# Patient Record
Sex: Female | Born: 1935 | Race: White | Hispanic: No | Smoking: Never smoker
Health system: Southern US, Community
[De-identification: ages and names within clinical notes are randomized; demographics above are authoritative.]

## PROBLEM LIST (undated history)

## (undated) DIAGNOSIS — E039 Hypothyroidism, unspecified: Secondary | ICD-10-CM

## (undated) DIAGNOSIS — E119 Type 2 diabetes mellitus without complications: Secondary | ICD-10-CM

## (undated) DIAGNOSIS — C55 Malignant neoplasm of uterus, part unspecified: Secondary | ICD-10-CM

## (undated) DIAGNOSIS — M109 Gout, unspecified: Secondary | ICD-10-CM

## (undated) DIAGNOSIS — G4733 Obstructive sleep apnea (adult) (pediatric): Secondary | ICD-10-CM

## (undated) DIAGNOSIS — I509 Heart failure, unspecified: Secondary | ICD-10-CM

## (undated) DIAGNOSIS — I82409 Acute embolism and thrombosis of unspecified deep veins of unspecified lower extremity: Secondary | ICD-10-CM

## (undated) DIAGNOSIS — J189 Pneumonia, unspecified organism: Secondary | ICD-10-CM

## (undated) DIAGNOSIS — K219 Gastro-esophageal reflux disease without esophagitis: Secondary | ICD-10-CM

## (undated) DIAGNOSIS — I10 Essential (primary) hypertension: Secondary | ICD-10-CM

## (undated) DIAGNOSIS — K5720 Diverticulitis of large intestine with perforation and abscess without bleeding: Secondary | ICD-10-CM

## (undated) DIAGNOSIS — M1990 Unspecified osteoarthritis, unspecified site: Secondary | ICD-10-CM

## (undated) DIAGNOSIS — M545 Low back pain, unspecified: Secondary | ICD-10-CM

## (undated) DIAGNOSIS — R0600 Dyspnea, unspecified: Secondary | ICD-10-CM

## (undated) DIAGNOSIS — F329 Major depressive disorder, single episode, unspecified: Secondary | ICD-10-CM

## (undated) DIAGNOSIS — G8929 Other chronic pain: Secondary | ICD-10-CM

## (undated) DIAGNOSIS — F32A Depression, unspecified: Secondary | ICD-10-CM

## (undated) DIAGNOSIS — E785 Hyperlipidemia, unspecified: Secondary | ICD-10-CM

## (undated) DIAGNOSIS — K449 Diaphragmatic hernia without obstruction or gangrene: Secondary | ICD-10-CM

## (undated) DIAGNOSIS — R350 Frequency of micturition: Secondary | ICD-10-CM

## (undated) DIAGNOSIS — R351 Nocturia: Secondary | ICD-10-CM

## (undated) DIAGNOSIS — I809 Phlebitis and thrombophlebitis of unspecified site: Secondary | ICD-10-CM

## (undated) DIAGNOSIS — Z6841 Body Mass Index (BMI) 40.0 and over, adult: Secondary | ICD-10-CM

## (undated) DIAGNOSIS — M479 Spondylosis, unspecified: Secondary | ICD-10-CM

## (undated) DIAGNOSIS — Z9989 Dependence on other enabling machines and devices: Principal | ICD-10-CM

## (undated) HISTORY — DX: Gastro-esophageal reflux disease without esophagitis: K21.9

## (undated) HISTORY — DX: Essential (primary) hypertension: I10

## (undated) HISTORY — DX: Morbid (severe) obesity due to excess calories: E66.01

## (undated) HISTORY — DX: Dependence on other enabling machines and devices: Z99.89

## (undated) HISTORY — PX: HERNIA REPAIR: SHX51

## (undated) HISTORY — DX: Hyperlipidemia, unspecified: E78.5

## (undated) HISTORY — DX: Depression, unspecified: F32.A

## (undated) HISTORY — DX: Major depressive disorder, single episode, unspecified: F32.9

## (undated) HISTORY — DX: Spondylosis, unspecified: M47.9

## (undated) HISTORY — DX: Body Mass Index (BMI) 40.0 and over, adult: Z684

## (undated) HISTORY — DX: Phlebitis and thrombophlebitis of unspecified site: I80.9

## (undated) HISTORY — PX: APPENDECTOMY: SHX54

## (undated) HISTORY — PX: COLONOSCOPY: SHX174

## (undated) HISTORY — DX: Diaphragmatic hernia without obstruction or gangrene: K44.9

## (undated) HISTORY — DX: Obstructive sleep apnea (adult) (pediatric): G47.33

---

## 1961-09-28 HISTORY — PX: WISDOM TOOTH EXTRACTION: SHX21

## 1966-09-28 HISTORY — PX: VARICOSE VEIN SURGERY: SHX832

## 1980-09-28 HISTORY — PX: TUBAL LIGATION: SHX77

## 1998-05-02 ENCOUNTER — Ambulatory Visit (HOSPITAL_COMMUNITY): Admission: RE | Admit: 1998-05-02 | Discharge: 1998-05-02 | Payer: Self-pay | Admitting: Family Medicine

## 1999-06-23 ENCOUNTER — Other Ambulatory Visit: Admission: RE | Admit: 1999-06-23 | Discharge: 1999-06-23 | Payer: Self-pay | Admitting: Obstetrics and Gynecology

## 2000-06-22 ENCOUNTER — Other Ambulatory Visit: Admission: RE | Admit: 2000-06-22 | Discharge: 2000-06-22 | Payer: Self-pay | Admitting: *Deleted

## 2001-06-24 ENCOUNTER — Other Ambulatory Visit: Admission: RE | Admit: 2001-06-24 | Discharge: 2001-06-24 | Payer: Self-pay | Admitting: Obstetrics and Gynecology

## 2002-06-22 ENCOUNTER — Other Ambulatory Visit: Admission: RE | Admit: 2002-06-22 | Discharge: 2002-06-22 | Payer: Self-pay | Admitting: Obstetrics and Gynecology

## 2002-09-28 HISTORY — PX: LAPAROSCOPIC CHOLECYSTECTOMY: SUR755

## 2002-09-28 HISTORY — PX: LAPAROSCOPIC INCISIONAL / UMBILICAL / VENTRAL HERNIA REPAIR: SUR789

## 2003-11-12 ENCOUNTER — Ambulatory Visit (HOSPITAL_COMMUNITY): Admission: RE | Admit: 2003-11-12 | Discharge: 2003-11-12 | Payer: Self-pay | Admitting: Family Medicine

## 2004-03-21 ENCOUNTER — Ambulatory Visit (HOSPITAL_COMMUNITY): Admission: RE | Admit: 2004-03-21 | Discharge: 2004-03-21 | Payer: Self-pay | Admitting: Family Medicine

## 2005-09-07 ENCOUNTER — Encounter: Admission: RE | Admit: 2005-09-07 | Discharge: 2005-09-07 | Payer: Self-pay | Admitting: Surgery

## 2005-10-02 ENCOUNTER — Ambulatory Visit (HOSPITAL_COMMUNITY): Admission: RE | Admit: 2005-10-02 | Discharge: 2005-10-02 | Payer: Self-pay | Admitting: Surgery

## 2005-10-02 ENCOUNTER — Encounter (INDEPENDENT_AMBULATORY_CARE_PROVIDER_SITE_OTHER): Payer: Self-pay | Admitting: *Deleted

## 2008-12-14 ENCOUNTER — Encounter: Admission: RE | Admit: 2008-12-14 | Discharge: 2008-12-14 | Payer: Self-pay | Admitting: Family Medicine

## 2009-01-05 ENCOUNTER — Encounter: Admission: RE | Admit: 2009-01-05 | Discharge: 2009-01-05 | Payer: Self-pay | Admitting: Family Medicine

## 2009-02-22 ENCOUNTER — Encounter: Admission: RE | Admit: 2009-02-22 | Discharge: 2009-02-22 | Payer: Self-pay | Admitting: Neurological Surgery

## 2009-03-29 ENCOUNTER — Encounter: Admission: RE | Admit: 2009-03-29 | Discharge: 2009-03-29 | Payer: Self-pay | Admitting: Neurological Surgery

## 2009-10-04 ENCOUNTER — Encounter: Admission: RE | Admit: 2009-10-04 | Discharge: 2009-10-04 | Payer: Self-pay | Admitting: Neurological Surgery

## 2010-07-18 ENCOUNTER — Encounter: Admission: RE | Admit: 2010-07-18 | Discharge: 2010-07-18 | Payer: Self-pay | Admitting: Family Medicine

## 2011-02-13 NOTE — Op Note (Signed)
NAMEJOESPHINE, SCHEMM               ACCOUNT NO.:  [id number]   MEDICAL RECORD NO.:  [id number]          PATIENT TYPE:  AMB   LOCATION:  DAY                          FACILITY:  [facility]   PHYSICIAN:  Sandria Bales. Ezzard Standing, M.D.  DATE OF BIRTH:  January 17, 1935   DATE OF PROCEDURE:  10/02/2005  DATE OF DISCHARGE:                                 OPERATIVE REPORT   PREOPERATIVE DIAGNOSIS:  Incarcerated umbilical hernia, symptomatic  gallstones.   POSTOPERATIVE DIAGNOSIS:  Incarcerated umbilical hernia with omentum over  the transverse colon, chronic cholecystitis with cholelithiasis.   PROCEDURE:  Laparoscopic cholecystectomy and open repair of incarcerated  umbilical hernia (I spent approximately 45 minutes doing hernia).   SURGEON:  Sandria Bales. Ezzard Standing, M.D.   FIRST ASSISTANT:  Dr. Daphine Deutscher.   ANESTHESIA:  General endotracheal.   ESTIMATED BLOOD LOSS:  Minimal.   INDICATIONS FOR PROCEDURE:  Ms. Hevia is a 76 year old white female who  has vague abdominal pain, but a CT scan done on November 20th showed three  things, first a large hiatal hernia, second, it showed a prominent umbilical  hernia containing incarcerated loop of transverse colon, third,  cholelithiasis.  The patient had a hepatobiliary scan which showed a poor  ejection fraction consistent with chronic cholecystitis.   Patient now presents for two things, first a laparoscopic cholecystectomy,  second, a repair of her umbilical hernia.  The indications and potential  complications of both procedures were explained to the patient.  Potential  complications include but are not limited to bleeding, infection, bile duct  injury, the possibility of hernia infection and recurrence.   OPERATIVE NOTE:  Patient placed in the supine position.  Given a general  endotracheal anesthetic.  She was given 1 gm of Ancef at the initiation of  the procedure.  Her abdomen was prepped with Betadine solution and sterilely  draped.   A smiling  infraumbilical incision was made.  Sharp dissection was carried  down to the rectus abdominis fascia.  I then entered the abdominal cavity  and placed a 0 degree 10 mm laparoscope through a 12 mm Hasson trocar and  secured the Hasson trocar  with a 0 Vicryl suture.  I placed a 10 mm [employer] trocar in the subxiphoid location and a 5 mm right mid subcostal and  5 mm lateral subcostal.  On abdominal exploration, the right and left lobe  of the liver both had evidence of fatty infiltration of the liver,  consistent with her weight.  She is noted to be morbidly obese with  approximately a BMI of 45.   The omentum had been trapped in the hernia.  I spent the first 5 or 10  minutes of the case reducing this omentum from her umbilical hernia.   Once this was reduced, I then turned my attention to the gallbladder.  The  gallbladder was noted to be kind of tucked under the right lobe of the liver  with the medial lobe and the left lobe kind of flopping on top.  This  required a little bit of retraction to get this out  of the way.   I then identified the cystic duct and cystic artery.  I doubly clipped the  cystic artery and got down to the cystic duct.  I put a single clip on the  gallbladder side of the cystic duct.  I then did an intraoperative  cholangiogram.   The intraoperative cholangiogram was done using a cut taut catheter.  Placed  through a 14 gauge Jelco into the side of the side of the cystic duct and  secured with an Endoclip.  I then shot an intraoperative cholangiogram under  fluoroscopy using about 8 cc of half-strength Hypaque solution.  This showed  free flow of contrast down the cystic duct into the common bile duct, up to  the hepatic radicals, down into the duodenal.   This was interpreted as a normal cholangiogram.  The taut catheter was then  removed.  The cystic duct was triply endo-clipped and divided.  The  gallbladder was then sharply and bluntly dissected  from the gallbladder bed.  Prior to complete revision of the gallbladder bed, I revisualized the  triangle of Calot.  I revisualized the gallbladder bed.  There was no  bleeding or bile leak from either area.  I then divided the gallbladder,  placed it in an EndoCatch bag and delivered it through the umbilicus.   I then turned my attention to the umbilical hernia.  I spent approximately  45 minutes, partly because of the patient's obesity, getting down to the  hernia defect.  Her hernia defect was probably about 3 cm in diameter.  I  resected the sac and inverted the sac.  I then closed the hernia  transversely with #1 Novofil sutures.  I did not feel comfortable putting  mesh in because I had done the gallbladder operation with spillage of bile.   I then closed access to her infraumbilical port site with two interrupted 0  Vicryl.  I then retacked the umbilical skin back down to the fascia using a  3-0 Vicryl suture and then closed the skin with a 5-0 Monocryl suture.   The patient tolerated the procedure well.  Sponge and needle counts were  correct at the end of the case.  We put tincture benzoin and Steri-Strips on  each wound after closing with 5-0 Monocryl suture.  Her sponge and needle  count were correct at the end of the case.  She plans to try to go home  today and will see me back in two weeks.      Sandria Bales. Ezzard Standing, M.D.  Electronically Signed     DHN/MEDQ  D:  10/02/2005  T:  10/02/2005  Job:  [id number]   cc:   Molly Maduro A. Nicholos Johns, M.D.  Fax: [phone]

## 2011-10-08 DIAGNOSIS — N952 Postmenopausal atrophic vaginitis: Secondary | ICD-10-CM | POA: Diagnosis not present

## 2011-10-27 DIAGNOSIS — R5381 Other malaise: Secondary | ICD-10-CM | POA: Diagnosis not present

## 2011-10-27 DIAGNOSIS — Z0000 Encounter for general adult medical examination without abnormal findings: Secondary | ICD-10-CM | POA: Diagnosis not present

## 2011-10-27 DIAGNOSIS — R7309 Other abnormal glucose: Secondary | ICD-10-CM | POA: Diagnosis not present

## 2011-11-05 DIAGNOSIS — Z23 Encounter for immunization: Secondary | ICD-10-CM | POA: Diagnosis not present

## 2011-11-12 DIAGNOSIS — G4733 Obstructive sleep apnea (adult) (pediatric): Secondary | ICD-10-CM | POA: Diagnosis not present

## 2012-01-06 DIAGNOSIS — N952 Postmenopausal atrophic vaginitis: Secondary | ICD-10-CM | POA: Diagnosis not present

## 2012-01-20 DIAGNOSIS — L820 Inflamed seborrheic keratosis: Secondary | ICD-10-CM | POA: Diagnosis not present

## 2012-01-20 DIAGNOSIS — L259 Unspecified contact dermatitis, unspecified cause: Secondary | ICD-10-CM | POA: Diagnosis not present

## 2012-01-20 DIAGNOSIS — L723 Sebaceous cyst: Secondary | ICD-10-CM | POA: Diagnosis not present

## 2012-01-28 DIAGNOSIS — G4733 Obstructive sleep apnea (adult) (pediatric): Secondary | ICD-10-CM | POA: Diagnosis not present

## 2012-01-28 DIAGNOSIS — E669 Obesity, unspecified: Secondary | ICD-10-CM | POA: Diagnosis not present

## 2012-01-28 DIAGNOSIS — I10 Essential (primary) hypertension: Secondary | ICD-10-CM | POA: Diagnosis not present

## 2012-04-13 DIAGNOSIS — N952 Postmenopausal atrophic vaginitis: Secondary | ICD-10-CM | POA: Diagnosis not present

## 2012-05-17 DIAGNOSIS — M109 Gout, unspecified: Secondary | ICD-10-CM | POA: Diagnosis not present

## 2012-05-17 DIAGNOSIS — N3941 Urge incontinence: Secondary | ICD-10-CM | POA: Diagnosis not present

## 2012-05-17 DIAGNOSIS — F329 Major depressive disorder, single episode, unspecified: Secondary | ICD-10-CM | POA: Diagnosis not present

## 2012-05-17 DIAGNOSIS — E119 Type 2 diabetes mellitus without complications: Secondary | ICD-10-CM | POA: Diagnosis not present

## 2012-05-17 DIAGNOSIS — K219 Gastro-esophageal reflux disease without esophagitis: Secondary | ICD-10-CM | POA: Diagnosis not present

## 2012-05-17 DIAGNOSIS — E785 Hyperlipidemia, unspecified: Secondary | ICD-10-CM | POA: Diagnosis not present

## 2012-05-17 DIAGNOSIS — I10 Essential (primary) hypertension: Secondary | ICD-10-CM | POA: Diagnosis not present

## 2012-05-17 DIAGNOSIS — R609 Edema, unspecified: Secondary | ICD-10-CM | POA: Diagnosis not present

## 2012-05-17 DIAGNOSIS — F3289 Other specified depressive episodes: Secondary | ICD-10-CM | POA: Diagnosis not present

## 2012-05-27 DIAGNOSIS — M25569 Pain in unspecified knee: Secondary | ICD-10-CM | POA: Diagnosis not present

## 2012-06-21 DIAGNOSIS — M25569 Pain in unspecified knee: Secondary | ICD-10-CM | POA: Diagnosis not present

## 2012-06-21 DIAGNOSIS — Z23 Encounter for immunization: Secondary | ICD-10-CM | POA: Diagnosis not present

## 2012-06-21 DIAGNOSIS — E119 Type 2 diabetes mellitus without complications: Secondary | ICD-10-CM | POA: Diagnosis not present

## 2012-07-01 DIAGNOSIS — M1710 Unilateral primary osteoarthritis, unspecified knee: Secondary | ICD-10-CM | POA: Diagnosis not present

## 2012-07-08 DIAGNOSIS — M1710 Unilateral primary osteoarthritis, unspecified knee: Secondary | ICD-10-CM | POA: Diagnosis not present

## 2012-07-18 DIAGNOSIS — M1710 Unilateral primary osteoarthritis, unspecified knee: Secondary | ICD-10-CM | POA: Diagnosis not present

## 2012-07-27 DIAGNOSIS — N952 Postmenopausal atrophic vaginitis: Secondary | ICD-10-CM | POA: Diagnosis not present

## 2012-07-27 DIAGNOSIS — Z124 Encounter for screening for malignant neoplasm of cervix: Secondary | ICD-10-CM | POA: Diagnosis not present

## 2012-07-27 DIAGNOSIS — N951 Menopausal and female climacteric states: Secondary | ICD-10-CM | POA: Diagnosis not present

## 2012-07-29 DIAGNOSIS — I10 Essential (primary) hypertension: Secondary | ICD-10-CM | POA: Diagnosis not present

## 2012-07-29 DIAGNOSIS — G4733 Obstructive sleep apnea (adult) (pediatric): Secondary | ICD-10-CM | POA: Diagnosis not present

## 2012-08-24 DIAGNOSIS — N3000 Acute cystitis without hematuria: Secondary | ICD-10-CM | POA: Diagnosis not present

## 2012-08-24 DIAGNOSIS — N3941 Urge incontinence: Secondary | ICD-10-CM | POA: Diagnosis not present

## 2012-08-24 DIAGNOSIS — E119 Type 2 diabetes mellitus without complications: Secondary | ICD-10-CM | POA: Diagnosis not present

## 2012-09-07 DIAGNOSIS — Z1231 Encounter for screening mammogram for malignant neoplasm of breast: Secondary | ICD-10-CM | POA: Diagnosis not present

## 2012-10-31 DIAGNOSIS — N952 Postmenopausal atrophic vaginitis: Secondary | ICD-10-CM | POA: Diagnosis not present

## 2012-11-22 DIAGNOSIS — E039 Hypothyroidism, unspecified: Secondary | ICD-10-CM | POA: Diagnosis not present

## 2012-11-22 DIAGNOSIS — K219 Gastro-esophageal reflux disease without esophagitis: Secondary | ICD-10-CM | POA: Diagnosis not present

## 2012-11-22 DIAGNOSIS — I10 Essential (primary) hypertension: Secondary | ICD-10-CM | POA: Diagnosis not present

## 2012-11-22 DIAGNOSIS — E119 Type 2 diabetes mellitus without complications: Secondary | ICD-10-CM | POA: Diagnosis not present

## 2012-11-22 DIAGNOSIS — E785 Hyperlipidemia, unspecified: Secondary | ICD-10-CM | POA: Diagnosis not present

## 2012-11-22 DIAGNOSIS — Z0000 Encounter for general adult medical examination without abnormal findings: Secondary | ICD-10-CM | POA: Diagnosis not present

## 2013-01-30 DIAGNOSIS — I10 Essential (primary) hypertension: Secondary | ICD-10-CM | POA: Diagnosis not present

## 2013-01-30 DIAGNOSIS — G4733 Obstructive sleep apnea (adult) (pediatric): Secondary | ICD-10-CM | POA: Diagnosis not present

## 2013-02-01 DIAGNOSIS — N952 Postmenopausal atrophic vaginitis: Secondary | ICD-10-CM | POA: Diagnosis not present

## 2013-04-03 DIAGNOSIS — R300 Dysuria: Secondary | ICD-10-CM | POA: Diagnosis not present

## 2013-04-21 DIAGNOSIS — R300 Dysuria: Secondary | ICD-10-CM | POA: Diagnosis not present

## 2013-05-10 DIAGNOSIS — R300 Dysuria: Secondary | ICD-10-CM | POA: Diagnosis not present

## 2013-05-10 DIAGNOSIS — N390 Urinary tract infection, site not specified: Secondary | ICD-10-CM | POA: Diagnosis not present

## 2013-05-10 DIAGNOSIS — N952 Postmenopausal atrophic vaginitis: Secondary | ICD-10-CM | POA: Diagnosis not present

## 2013-06-01 DIAGNOSIS — M1710 Unilateral primary osteoarthritis, unspecified knee: Secondary | ICD-10-CM | POA: Diagnosis not present

## 2013-06-01 DIAGNOSIS — R350 Frequency of micturition: Secondary | ICD-10-CM | POA: Diagnosis not present

## 2013-06-01 DIAGNOSIS — IMO0002 Reserved for concepts with insufficient information to code with codable children: Secondary | ICD-10-CM | POA: Diagnosis not present

## 2013-06-01 DIAGNOSIS — I10 Essential (primary) hypertension: Secondary | ICD-10-CM | POA: Diagnosis not present

## 2013-06-01 DIAGNOSIS — R609 Edema, unspecified: Secondary | ICD-10-CM | POA: Diagnosis not present

## 2013-06-01 DIAGNOSIS — K219 Gastro-esophageal reflux disease without esophagitis: Secondary | ICD-10-CM | POA: Diagnosis not present

## 2013-06-01 DIAGNOSIS — E785 Hyperlipidemia, unspecified: Secondary | ICD-10-CM | POA: Diagnosis not present

## 2013-06-01 DIAGNOSIS — N951 Menopausal and female climacteric states: Secondary | ICD-10-CM | POA: Diagnosis not present

## 2013-06-01 DIAGNOSIS — E119 Type 2 diabetes mellitus without complications: Secondary | ICD-10-CM | POA: Diagnosis not present

## 2013-06-01 DIAGNOSIS — Z23 Encounter for immunization: Secondary | ICD-10-CM | POA: Diagnosis not present

## 2013-06-08 DIAGNOSIS — Z780 Asymptomatic menopausal state: Secondary | ICD-10-CM | POA: Diagnosis not present

## 2013-06-08 DIAGNOSIS — M899 Disorder of bone, unspecified: Secondary | ICD-10-CM | POA: Diagnosis not present

## 2013-06-28 DIAGNOSIS — J0100 Acute maxillary sinusitis, unspecified: Secondary | ICD-10-CM | POA: Diagnosis not present

## 2013-08-03 DIAGNOSIS — M1710 Unilateral primary osteoarthritis, unspecified knee: Secondary | ICD-10-CM | POA: Diagnosis not present

## 2013-08-10 ENCOUNTER — Ambulatory Visit (INDEPENDENT_AMBULATORY_CARE_PROVIDER_SITE_OTHER): Payer: Medicare Other | Admitting: Cardiology

## 2013-08-10 ENCOUNTER — Encounter: Payer: Self-pay | Admitting: Cardiology

## 2013-08-10 VITALS — BP 130/74 | HR 84 | Ht 60.0 in | Wt 218.0 lb

## 2013-08-10 DIAGNOSIS — G4733 Obstructive sleep apnea (adult) (pediatric): Secondary | ICD-10-CM | POA: Diagnosis not present

## 2013-08-10 DIAGNOSIS — I10 Essential (primary) hypertension: Secondary | ICD-10-CM | POA: Diagnosis not present

## 2013-08-10 DIAGNOSIS — Z6841 Body Mass Index (BMI) 40.0 and over, adult: Secondary | ICD-10-CM

## 2013-08-10 NOTE — Progress Notes (Signed)
[address], [address] [place], [place]  [address] Phone: [phone] Fax:  [phone]  Date:  08/10/2013   ID:  Melinda Kerr, DOB [birth year], MRN [id number]  PCP:  No primary provider on file.  Sleep Medicine:  Armanda Magic ,MD  History of Present Illness: Melinda Kerr is a 78 y.o. female with a history of obesity, OSA on CPAP and HTN who presents today for followup.  She is doing well. She uses the full face mask which she tolerates well.  She feels the pressure is adequate.  She feels rested when she gets up in the am and has no daytime sleepiness.  She exercise is limited due to difficulty with ambulation and walks with a cane due to knee problems.  She does water aerobics.     Wt Readings from Last 3 Encounters:  08/10/13 218 lb (98.884 kg)     Past Medical History  Diagnosis Date  . Hyperlipidemia   . Depression   . OSA on CPAP   . Spondylosis     with scoliosis  . Phlebitis   . Esophageal reflux     Current Outpatient Prescriptions  Medication Sig Dispense Refill  . alendronate (FOSAMAX) 70 MG tablet Take 70 mg by mouth once a week. Take with a full glass of water on an empty stomach.      Marland Kitchen allopurinol (ZYLOPRIM) 300 MG tablet Take 300 mg by mouth daily.      Marland Kitchen aspirin 81 MG tablet Take 81 mg by mouth daily.      . Calcium 600-200 MG-UNIT per tablet Take 1 tablet by mouth 2 (two) times daily.      . citalopram (CELEXA) 20 MG tablet Take 20 mg by mouth as directed. 1/2 tablet daily.      . cyclobenzaprine (FLEXERIL) 10 MG tablet Take 10 mg by mouth 3 (three) times daily as needed for muscle spasms.      . ferrous sulfate dried (SLOW FE) 160 (50 FE) MG TBCR SR tablet Take 160 mg by mouth daily.      . furosemide (LASIX) 40 MG tablet Take 20 mg by mouth.      Melinda Kerr 565 MG CAPS Take by mouth 2 (two) times daily.      . hydrOXYzine (VISTARIL) 25 MG capsule Take 25 mg by mouth 3 (three) times daily as needed.      Marland Kitchen levothyroxine (SYNTHROID,  LEVOTHROID) 125 MCG tablet Take 125 mcg by mouth daily before breakfast.      . lisinopril (PRINIVIL,ZESTRIL) 20 MG tablet Take 20 mg by mouth daily.      . Loratadine (CLARITIN) 10 MG CAPS Take by mouth as needed.      . Multiple Vitamin (M.V.I. ADULT IV) Take by mouth daily.      Marland Kitchen omeprazole (PRILOSEC OTC) 20 MG tablet Take 20 mg by mouth daily.      Marland Kitchen pyridOXINE (VITAMIN B-6) 100 MG tablet Take 100 mg by mouth daily.      . ranitidine (ZANTAC) 300 MG tablet Take 300 mg by mouth at bedtime.      . simvastatin (ZOCOR) 20 MG tablet Take 20 mg by mouth daily.      Marland Kitchen tolterodine (DETROL LA) 4 MG 24 hr capsule Take 4 mg by mouth daily.      . traMADol (ULTRAM) 50 MG tablet Take 50 mg by mouth every 6 (six) hours as needed.      . vitamin  C (ASCORBIC ACID) 500 MG tablet Take 500 mg by mouth daily.      . vitamin E 400 UNIT capsule Take 400 Units by mouth daily.       No current facility-administered medications for this visit.    Allergies:    Allergies  Allergen Reactions  . Oxybutynin     Bleeding gums, felt sick    Social History:  The patient  reports that she has never smoked. She does not have any smokeless tobacco history on file.   Family History:  The patient's family history includes CVA in her father; Heart disease in her father and mother.   ROS:  Please see the history of present illness.      All other systems reviewed and negative.   PHYSICAL EXAM: VS:  BP 130/74  Pulse 84  Ht 5' (1.524 m)  Wt 218 lb (98.884 kg)  BMI 42.58 kg/m2 Well nourished, well developed, in no acute distress HEENT: normal Neck: no JVD Cardiac:  normal S1, S2; RRR; no murmur Lungs:  clear to auscultation bilaterally, no wheezing, rhonchi or rales Abd: soft, nontender, no hepatomegaly Ext: no edema Skin: warm and dry Neuro:  CNs 2-12 intact, no focal abnormalities noted       ASSESSMENT AND PLAN:  1. Severe OSA on CPAP and tolerating well.  Her download today showed an AHI of 2.5/hr  and 81% compliance in using more than 4 hours nightly.  - continue on current CPAP setting 2.  HTN - controlled  - continue Lisinopril 3.  Morbid obesity  - continue water aerobics.  She is going to have a knee replacement in January so hopefully by Spring she can start to exercise  Followup with me in 6 months  Signed, Armanda Magic, MD 08/10/2013 3:48 PM

## 2013-08-10 NOTE — Patient Instructions (Signed)
Your physician recommends that you continue on your current medications as directed. Please refer to the Current Medication list given to you today.  Your physician wants you to follow-up in: 6 Months with Dr Turner You will receive a reminder letter in the mail two months in advance. If you don't receive a letter, please call our office to schedule the follow-up appointment.  

## 2013-08-11 DIAGNOSIS — N952 Postmenopausal atrophic vaginitis: Secondary | ICD-10-CM | POA: Diagnosis not present

## 2013-09-08 DIAGNOSIS — Z1231 Encounter for screening mammogram for malignant neoplasm of breast: Secondary | ICD-10-CM | POA: Diagnosis not present

## 2013-09-25 ENCOUNTER — Other Ambulatory Visit: Payer: Self-pay | Admitting: Orthopedic Surgery

## 2013-09-26 ENCOUNTER — Encounter (HOSPITAL_COMMUNITY): Payer: Self-pay | Admitting: Pharmacy Technician

## 2013-10-02 ENCOUNTER — Encounter (HOSPITAL_COMMUNITY)
Admission: RE | Admit: 2013-10-02 | Discharge: 2013-10-02 | Disposition: A | Discharge status: Home / Self Care | Payer: Medicare Other | Source: Ambulatory Visit | Attending: Orthopedic Surgery | Admitting: Orthopedic Surgery

## 2013-10-02 ENCOUNTER — Other Ambulatory Visit (HOSPITAL_COMMUNITY): Payer: Self-pay | Admitting: Orthopedic Surgery

## 2013-10-02 ENCOUNTER — Encounter (HOSPITAL_COMMUNITY): Payer: Self-pay

## 2013-10-02 ENCOUNTER — Ambulatory Visit (HOSPITAL_COMMUNITY)
Admission: RE | Admit: 2013-10-02 | Discharge: 2013-10-02 | Disposition: A | Discharge status: Home / Self Care | Payer: Medicare Other | Source: Ambulatory Visit | Attending: Orthopedic Surgery | Admitting: Orthopedic Surgery

## 2013-10-02 DIAGNOSIS — Z01812 Encounter for preprocedural laboratory examination: Secondary | ICD-10-CM | POA: Diagnosis not present

## 2013-10-02 DIAGNOSIS — K449 Diaphragmatic hernia without obstruction or gangrene: Secondary | ICD-10-CM | POA: Diagnosis not present

## 2013-10-02 DIAGNOSIS — Z01818 Encounter for other preprocedural examination: Secondary | ICD-10-CM | POA: Insufficient documentation

## 2013-10-02 DIAGNOSIS — Z01810 Encounter for preprocedural cardiovascular examination: Secondary | ICD-10-CM | POA: Diagnosis not present

## 2013-10-02 DIAGNOSIS — M1710 Unilateral primary osteoarthritis, unspecified knee: Secondary | ICD-10-CM | POA: Insufficient documentation

## 2013-10-02 LAB — COMPREHENSIVE METABOLIC PANEL
ALT: 13 U/L (ref 0–35)
AST: 18 U/L (ref 0–37)
Albumin: 3.7 g/dL (ref 3.5–5.2)
Alkaline Phosphatase: 58 U/L (ref 39–117)
BUN: 17 mg/dL (ref 6–23)
CHLORIDE: 100 meq/L (ref 96–112)
CO2: 27 mEq/L (ref 19–32)
CREATININE: 0.84 mg/dL (ref 0.50–1.10)
Calcium: 9.8 mg/dL (ref 8.4–10.5)
GFR calc Af Amer: 76 mL/min — ABNORMAL LOW (ref >90)
GFR calc non Af Amer: 66 mL/min — ABNORMAL LOW (ref >90)
Glucose, Bld: 89 mg/dL (ref 70–99)
Potassium: 4.4 mEq/L (ref 3.7–5.3)
Sodium: 136 mEq/L — ABNORMAL LOW (ref 137–147)
TOTAL PROTEIN: 6.4 g/dL (ref 6.0–8.3)
Total Bilirubin: 0.5 mg/dL (ref 0.3–1.2)

## 2013-10-02 LAB — SURGICAL PCR SCREEN
MRSA, PCR: POSITIVE — AB
Staphylococcus aureus: POSITIVE — AB

## 2013-10-02 LAB — URINALYSIS, ROUTINE W REFLEX MICROSCOPIC
Bilirubin Urine: NEGATIVE
GLUCOSE, UA: NEGATIVE mg/dL
Hgb urine dipstick: NEGATIVE
KETONES UR: NEGATIVE mg/dL
Nitrite: NEGATIVE
Protein, ur: NEGATIVE mg/dL
Specific Gravity, Urine: 1.01 (ref 1.005–1.030)
Urobilinogen, UA: 0.2 mg/dL (ref 0.0–1.0)
pH: 5.5 (ref 5.0–8.0)

## 2013-10-02 LAB — CBC
HCT: 41 % (ref 36.0–46.0)
Hemoglobin: 13.5 g/dL (ref 12.0–15.0)
MCH: 30.6 pg (ref 26.0–34.0)
MCHC: 32.9 g/dL (ref 30.0–36.0)
MCV: 93 fL (ref 78.0–100.0)
PLATELETS: 245 10*3/uL (ref 150–400)
RBC: 4.41 MIL/uL (ref 3.87–5.11)
RDW: 13.1 % (ref 11.5–15.5)
WBC: 7.2 10*3/uL (ref 4.0–10.5)

## 2013-10-02 LAB — URINE MICROSCOPIC-ADD ON

## 2013-10-02 LAB — PROTIME-INR
INR: 1.06 (ref 0.00–1.49)
Prothrombin Time: 13.6 seconds (ref 11.6–15.2)

## 2013-10-02 LAB — ABO/RH: ABO/RH(D): A NEG

## 2013-10-02 LAB — APTT: aPTT: 30 seconds (ref 24–37)

## 2013-10-02 NOTE — Progress Notes (Signed)
Micro ua results faxed to dr aluisio by epic 

## 2013-10-02 NOTE — Patient Instructions (Addendum)
[place]  10/02/2013   Your procedure is scheduled on: Monday January 12th  Report to [facility] at 600 AM.  Call this number if you have problems the morning of surgery [phone]   Remember:bring cpap mask and tubing   Do not eat food or drink liquids :After Midnight.     Take these medicines the morning of surgery with A SIP OF WATER: zyrtec, omeprazole, levothryoxine, tramadol, tylenol if needed,                                 [place] may not have any metal on your body including hair pins and piercings  Do not wear jewelry, make-up.  Do not wear lotions, powders, or perfumes. You may wear deodorant.   Men may shave face and neck.  Do not bring valuables to the hospital. [place].  Contacts, dentures or bridgework may not be worn into surgery.  Leave suitcase in the car. After surgery it may be brought to your room.  For patients admitted to the hospital, checkout time is 11:00 AM the day of discharge.   Patients discharged the day of surgery will not be allowed to drive home.  Name and phone number of your driver:  Special Instructions: N/A   Please read over the following fact sheets that you were given: mrsa information, blood sheet, incentive spirometer sheet, [place] preparing for surgery sheet  Call Zelphia Cairo RN pre op nurse if needed [phone]    [place].  PATIENT SIGNATURE___________________________________________  NURSE SIGNATURE_____________________________________________

## 2013-10-02 NOTE — Progress Notes (Signed)
lov note dr Radford Pax 08-10-13 epic

## 2013-10-03 NOTE — Progress Notes (Signed)
Fax received per dr Wynelle Link and placed on chart, no action on ua results

## 2013-10-04 NOTE — Progress Notes (Signed)
Fax received and placed on pt chart, mupirocin ointment called into [facility] from dr Wynelle Link

## 2013-10-05 ENCOUNTER — Other Ambulatory Visit: Payer: Self-pay | Admitting: Surgical

## 2013-10-05 NOTE — H&P (Signed)
TOTAL KNEE ADMISSION H&P  Patient is being admitted for left total knee arthroplasty.  Subjective:  Chief Complaint:left knee pain.  HPI: Melinda Kerr, 79 y.o. female, has a history of pain and functional disability in the left knee due to arthritis and has failed non-surgical conservative treatments for greater than 12 weeks to includeNSAID's and/or analgesics, corticosteriod injections, viscosupplementation injections and activity modification.  Onset of symptoms was gradual, starting 3 years ago with gradually worsening course since that time. The patient noted no past surgery on the left knee(s).  Patient currently rates pain in the left knee(s) at 7 out of 10 with activity. Patient has night pain, worsening of pain with activity and weight bearing, pain that interferes with activities of daily living, pain with passive range of motion, crepitus and joint swelling.  Patient has evidence of periarticular osteophytes and joint space narrowing by imaging studies. There is no active infection.  Patient Active Problem List   Diagnosis Date Noted  . OSA on CPAP 08/10/2013  . Morbid obesity with BMI of 40.0-44.9, adult   . Hypertension    Past Medical History  Diagnosis Date  . Hyperlipidemia   . Depression   . OSA on CPAP   . Spondylosis     with scoliosis  . Phlebitis   . Esophageal reflux   . Morbid obesity with BMI of 40.0-44.9, adult   . Hypertension     Past Surgical History  Procedure Laterality Date  . Tubal ligation  age 79  . Varicose vein surgery Bilateral age 37  . Wisdom tooth extraction  age 25  . Hernia repair  age 63    umblical  . Cholecystectomy  age 77     Current outpatient prescriptions: alendronate (FOSAMAX) 70 MG tablet, Take 70 mg by mouth once a week. Take with a full glass of water on an empty stomach., Disp: , Rfl: ;   allopurinol (ZYLOPRIM) 300 MG tablet, Take 300 mg by mouth daily with breakfast. , Disp: , Rfl: ;   aspirin 81 MG tablet, Take 81 mg  by mouth daily., Disp: , Rfl: ;   Calcium 600-200 MG-UNIT per tablet, Take 1 tablet by mouth 2 (two) times daily., Disp: , Rfl:  cetirizine (ZYRTEC) 10 MG tablet, Take 10 mg by mouth daily as needed for allergies., Disp: , Rfl: ;   citalopram (CELEXA) 20 MG tablet, Take 10 mg by mouth at bedtime. , Disp: , Rfl: ;   cyclobenzaprine (FLEXERIL) 10 MG tablet, Take 10 mg by mouth 3 (three) times daily as needed for muscle spasms., Disp: , Rfl: ;  ferrous sulfate dried (SLOW FE) 160 (50 FE) MG TBCR SR tablet, Take 160 mg by mouth daily., Disp: , Rfl:  furosemide (LASIX) 40 MG tablet, Take 20 mg by mouth., Disp: , Rfl: ;   Hawthorn Berry 565 MG CAPS, Take 1 capsule by mouth 2 (two) times daily. , Disp: , Rfl: ;   hydrOXYzine (VISTARIL) 25 MG capsule, Take 25 mg by mouth at bedtime. , Disp: , Rfl: ;   levothyroxine (SYNTHROID, LEVOTHROID) 125 MCG tablet, Take 125 mcg by mouth daily before breakfast., Disp: , Rfl:  lisinopril (PRINIVIL,ZESTRIL) 20 MG tablet, Take 20 mg by mouth daily with breakfast. , Disp: , Rfl: ;   Loratadine (CLARITIN) 10 MG CAPS, Take 1 capsule by mouth as needed (allergy). , Disp: , Rfl: ;   Multiple Vitamin (M.V.I. ADULT IV), Take 1 tablet by mouth daily. , Disp: , Rfl: ;  omeprazole (PRILOSEC OTC) 20 MG tablet, Take 20 mg by mouth daily., Disp: , Rfl: ;   pyridOXINE (VITAMIN B-6) 100 MG tablet, Take 100 mg by mouth daily., Disp: , Rfl:  ranitidine (ZANTAC) 300 MG tablet, Take 300 mg by mouth at bedtime., Disp: , Rfl: ;   simvastatin (ZOCOR) 20 MG tablet, Take 20 mg by mouth daily., Disp: , Rfl: ;   tolterodine (DETROL LA) 4 MG 24 hr capsule, Take 4 mg by mouth daily., Disp: , Rfl: ;   traMADol (ULTRAM) 50 MG tablet, Take 50 mg by mouth 2 (two) times daily. , Disp: , Rfl: ;   vitamin C (ASCORBIC ACID) 500 MG tablet, Take 500 mg by mouth daily., Disp: , Rfl:  vitamin E 400 UNIT capsule, Take 400 Units by mouth daily., Disp: , Rfl: ;   acetaminophen (TYLENOL) 500 MG tablet, Take  1,000 mg by mouth daily., Disp: , Rfl: ;   Melatonin 3 MG TABS, Take 9 mg by mouth at bedtime., Disp: , Rfl:   Allergies  Allergen Reactions  . Oxybutynin     Bleeding gums, felt sick    History  Substance Use Topics  . Smoking status: Never Smoker   . Smokeless tobacco: Never Used  . Alcohol Use: No    Family History  Problem Relation Age of Onset  . Heart disease Mother   . Heart disease Father   . CVA Father      Review of Systems  Constitutional: Negative.   HENT: Negative.   Eyes: Negative.   Respiratory: Negative.   Cardiovascular: Negative.   Gastrointestinal: Positive for constipation. Negative for heartburn, nausea, abdominal pain, diarrhea, blood in stool and melena.  Genitourinary: Positive for frequency. Negative for dysuria, urgency, hematuria and flank pain.  Musculoskeletal: Positive for back pain and joint pain. Negative for falls, myalgias and neck pain.       Left knee pain  Skin: Negative.   Neurological: Negative.   Endo/Heme/Allergies: Negative.   Psychiatric/Behavioral: Negative.     Objective:  Physical Exam  Constitutional: She is oriented to person, place, and time. She appears well-developed and well-nourished. No distress.  HENT:  Head: Normocephalic and atraumatic.  Right Ear: External ear normal.  Left Ear: External ear normal.  Nose: Nose normal.  Mouth/Throat: Oropharynx is clear and moist.  Eyes: Conjunctivae and EOM are normal.  Neck: Normal range of motion. Neck supple.  Cardiovascular:  No murmur heard. Respiratory: Effort normal and breath sounds normal. No respiratory distress. She has no wheezes.  GI: Soft. Bowel sounds are normal. She exhibits no distension. There is no tenderness.  Musculoskeletal:       Right hip: Normal.       Left hip: Normal.       Right knee: Normal.       Left knee: She exhibits decreased range of motion and swelling. She exhibits no effusion and no erythema. Tenderness found. Medial joint line and  lateral joint line tenderness noted.       Right lower leg: She exhibits no tenderness and no swelling.       Left lower leg: She exhibits no tenderness and no swelling.  No obvious effusion. Tenderness along the medial jointline moderate in severity. No lateral jointline pain. Stable ligamentously to varus and valgus testing. Extension is intact. Flexion about 110, slight pain with terminal flexion. Cruciates appear stable.  Neurological: She is alert and oriented to person, place, and time. She has normal strength and normal  reflexes. No sensory deficit.  Skin: No rash noted. She is not diaphoretic. No erythema.  Psychiatric: She has a normal mood and affect. Her behavior is normal.   Vitals Weight: 215 lb Height: 60 in Body Surface Area: 2.03 m Body Mass Index: 41.99 kg/m Pulse: 66 (Regular) BP: 122/76 (Sitting, Left Arm, Standard)  Imaging Review Plain radiographs demonstrate severe degenerative joint disease of the left knee(s). The overall alignment ismild varus. The bone quality appears to be good for age and reported activity level.  Assessment/Plan:  End stage arthritis, left knee   The patient history, physical examination, clinical judgment of the provider and imaging studies are consistent with end stage degenerative joint disease of the left knee(s) and total knee arthroplasty is deemed medically necessary. The treatment options including medical management, injection therapy arthroscopy and arthroplasty were discussed at length. The risks and benefits of total knee arthroplasty were presented and reviewed. The risks due to aseptic loosening, infection, stiffness, patella tracking problems, thromboembolic complications and other imponderables were discussed. The patient acknowledged the explanation, agreed to proceed with the plan and consent was signed. Patient is being admitted for inpatient treatment for surgery, pain control, PT, OT, prophylactic antibiotics, VTE  prophylaxis, progressive ambulation and ADL's and discharge planning. The patient is planning to be discharged to skilled nursing facility [facility])     Ardeen Jourdain, [place]

## 2013-10-09 ENCOUNTER — Encounter (HOSPITAL_COMMUNITY)
Admission: RE | Disposition: A | Discharge status: Skilled Nursing Facility | Payer: Self-pay | Source: Ambulatory Visit | Attending: Orthopedic Surgery

## 2013-10-09 ENCOUNTER — Encounter (HOSPITAL_COMMUNITY): Payer: Self-pay | Admitting: *Deleted

## 2013-10-09 ENCOUNTER — Inpatient Hospital Stay (HOSPITAL_COMMUNITY)
Admission: RE | Admit: 2013-10-09 | Discharge: 2013-10-12 | DRG: 470 | Disposition: A | Discharge status: Skilled Nursing Facility | Payer: Medicare Other | Source: Ambulatory Visit | Attending: Orthopedic Surgery | Admitting: Orthopedic Surgery

## 2013-10-09 ENCOUNTER — Encounter (HOSPITAL_COMMUNITY): Payer: Medicare Other | Admitting: Anesthesiology

## 2013-10-09 ENCOUNTER — Inpatient Hospital Stay (HOSPITAL_COMMUNITY): Payer: Medicare Other | Admitting: Anesthesiology

## 2013-10-09 DIAGNOSIS — M1990 Unspecified osteoarthritis, unspecified site: Secondary | ICD-10-CM | POA: Diagnosis not present

## 2013-10-09 DIAGNOSIS — I10 Essential (primary) hypertension: Secondary | ICD-10-CM

## 2013-10-09 DIAGNOSIS — IMO0002 Reserved for concepts with insufficient information to code with codable children: Secondary | ICD-10-CM | POA: Diagnosis not present

## 2013-10-09 DIAGNOSIS — Z8249 Family history of ischemic heart disease and other diseases of the circulatory system: Secondary | ICD-10-CM | POA: Diagnosis not present

## 2013-10-09 DIAGNOSIS — Z96652 Presence of left artificial knee joint: Secondary | ICD-10-CM

## 2013-10-09 DIAGNOSIS — F3289 Other specified depressive episodes: Secondary | ICD-10-CM | POA: Diagnosis present

## 2013-10-09 DIAGNOSIS — Z6841 Body Mass Index (BMI) 40.0 and over, adult: Secondary | ICD-10-CM

## 2013-10-09 DIAGNOSIS — M4120 Other idiopathic scoliosis, site unspecified: Secondary | ICD-10-CM | POA: Diagnosis present

## 2013-10-09 DIAGNOSIS — R279 Unspecified lack of coordination: Secondary | ICD-10-CM | POA: Diagnosis not present

## 2013-10-09 DIAGNOSIS — G4733 Obstructive sleep apnea (adult) (pediatric): Secondary | ICD-10-CM | POA: Diagnosis not present

## 2013-10-09 DIAGNOSIS — G4730 Sleep apnea, unspecified: Secondary | ICD-10-CM | POA: Diagnosis not present

## 2013-10-09 DIAGNOSIS — F329 Major depressive disorder, single episode, unspecified: Secondary | ICD-10-CM | POA: Diagnosis present

## 2013-10-09 DIAGNOSIS — Z471 Aftercare following joint replacement surgery: Secondary | ICD-10-CM | POA: Diagnosis not present

## 2013-10-09 DIAGNOSIS — K449 Diaphragmatic hernia without obstruction or gangrene: Secondary | ICD-10-CM | POA: Diagnosis present

## 2013-10-09 DIAGNOSIS — Z7982 Long term (current) use of aspirin: Secondary | ICD-10-CM

## 2013-10-09 DIAGNOSIS — E785 Hyperlipidemia, unspecified: Secondary | ICD-10-CM | POA: Diagnosis not present

## 2013-10-09 DIAGNOSIS — K219 Gastro-esophageal reflux disease without esophagitis: Secondary | ICD-10-CM | POA: Diagnosis present

## 2013-10-09 DIAGNOSIS — Z8672 Personal history of thrombophlebitis: Secondary | ICD-10-CM

## 2013-10-09 DIAGNOSIS — M1710 Unilateral primary osteoarthritis, unspecified knee: Principal | ICD-10-CM | POA: Diagnosis present

## 2013-10-09 DIAGNOSIS — Z823 Family history of stroke: Secondary | ICD-10-CM

## 2013-10-09 DIAGNOSIS — M179 Osteoarthritis of knee, unspecified: Secondary | ICD-10-CM | POA: Diagnosis present

## 2013-10-09 DIAGNOSIS — M6281 Muscle weakness (generalized): Secondary | ICD-10-CM | POA: Diagnosis not present

## 2013-10-09 DIAGNOSIS — E669 Obesity, unspecified: Secondary | ICD-10-CM | POA: Diagnosis not present

## 2013-10-09 DIAGNOSIS — M25569 Pain in unspecified knee: Secondary | ICD-10-CM | POA: Diagnosis not present

## 2013-10-09 DIAGNOSIS — R269 Unspecified abnormalities of gait and mobility: Secondary | ICD-10-CM | POA: Diagnosis not present

## 2013-10-09 HISTORY — PX: TOTAL KNEE ARTHROPLASTY: SHX125

## 2013-10-09 LAB — TYPE AND SCREEN
ABO/RH(D): A NEG
Antibody Screen: NEGATIVE

## 2013-10-09 SURGERY — ARTHROPLASTY, KNEE, TOTAL
Anesthesia: Spinal | Site: Knee | Laterality: Left

## 2013-10-09 MED ORDER — SODIUM CHLORIDE 0.9 % IJ SOLN
INTRAMUSCULAR | Status: AC
  Filled 2013-10-09: qty 10

## 2013-10-09 MED ORDER — ONDANSETRON HCL 4 MG/2ML IJ SOLN
INTRAMUSCULAR | Status: AC
  Filled 2013-10-09: qty 2

## 2013-10-09 MED ORDER — ACETAMINOPHEN 500 MG PO TABS
1000.0000 mg | ORAL_TABLET | Freq: Four times a day (QID) | ORAL | Status: AC
  Administered 2013-10-09 – 2013-10-10 (×4): 1000 mg via ORAL
  Filled 2013-10-09 (×4): qty 2

## 2013-10-09 MED ORDER — ACETAMINOPHEN 325 MG PO TABS: 650.0000 mg | ORAL_TABLET | Freq: Four times a day (QID) | ORAL | Status: DC | PRN

## 2013-10-09 MED ORDER — MIDAZOLAM HCL 5 MG/5ML IJ SOLN
INTRAMUSCULAR | Status: DC | PRN
  Administered 2013-10-09 (×2): 1 mg via INTRAVENOUS

## 2013-10-09 MED ORDER — BUPIVACAINE LIPOSOME 1.3 % IJ SUSP
INTRAMUSCULAR | Status: DC | PRN
  Administered 2013-10-09: 09:00:00

## 2013-10-09 MED ORDER — FLEET ENEMA 7-19 GM/118ML RE ENEM: 1.0000 | ENEMA | Freq: Once | RECTAL | Status: AC | PRN

## 2013-10-09 MED ORDER — LEVOTHYROXINE SODIUM 125 MCG PO TABS
125.0000 ug | ORAL_TABLET | Freq: Every day | ORAL | Status: DC
  Administered 2013-10-10 – 2013-10-12 (×3): 125 ug via ORAL
  Filled 2013-10-09 (×5): qty 1

## 2013-10-09 MED ORDER — METOCLOPRAMIDE HCL 5 MG/ML IJ SOLN
5.0000 mg | Freq: Three times a day (TID) | INTRAMUSCULAR | Status: DC | PRN
  Administered 2013-10-10: 19:00:00 10 mg via INTRAVENOUS
  Filled 2013-10-09: qty 2

## 2013-10-09 MED ORDER — CYCLOBENZAPRINE HCL 10 MG PO TABS: 10.0000 mg | ORAL_TABLET | Freq: Three times a day (TID) | ORAL | Status: DC | PRN

## 2013-10-09 MED ORDER — PHENOL 1.4 % MT LIQD: 1.0000 | OROMUCOSAL | Status: DC | PRN

## 2013-10-09 MED ORDER — POLYETHYLENE GLYCOL 3350 17 G PO PACK: 17.0000 g | PACK | Freq: Every day | ORAL | Status: DC | PRN

## 2013-10-09 MED ORDER — LACTATED RINGERS IV SOLN
INTRAVENOUS | Status: DC
  Administered 2013-10-09 (×3): via INTRAVENOUS

## 2013-10-09 MED ORDER — DEXAMETHASONE SODIUM PHOSPHATE 10 MG/ML IJ SOLN
INTRAMUSCULAR | Status: AC
  Filled 2013-10-09: qty 1

## 2013-10-09 MED ORDER — DOCUSATE SODIUM 100 MG PO CAPS
100.0000 mg | ORAL_CAPSULE | Freq: Two times a day (BID) | ORAL | Status: DC
  Administered 2013-10-09 – 2013-10-12 (×5): 100 mg via ORAL

## 2013-10-09 MED ORDER — BUPIVACAINE HCL (PF) 0.25 % IJ SOLN
INTRAMUSCULAR | Status: AC
  Filled 2013-10-09: qty 30

## 2013-10-09 MED ORDER — BUPIVACAINE LIPOSOME 1.3 % IJ SUSP
20.0000 mL | Freq: Once | INTRAMUSCULAR | Status: DC
  Filled 2013-10-09: qty 20

## 2013-10-09 MED ORDER — BUPIVACAINE IN DEXTROSE 0.75-8.25 % IT SOLN
INTRATHECAL | Status: DC | PRN
  Administered 2013-10-09: 1.5 mL via INTRATHECAL

## 2013-10-09 MED ORDER — KETAMINE HCL 10 MG/ML IJ SOLN
INTRAMUSCULAR | Status: DC | PRN
  Administered 2013-10-09 (×4): 10 mg via INTRAVENOUS

## 2013-10-09 MED ORDER — OXYCODONE HCL 5 MG PO TABS
5.0000 mg | ORAL_TABLET | ORAL | Status: DC | PRN
  Administered 2013-10-09: 5 mg via ORAL
  Administered 2013-10-10 – 2013-10-12 (×10): 10 mg via ORAL
  Filled 2013-10-09: qty 1
  Filled 2013-10-09 (×2): qty 2
  Filled 2013-10-09 (×2): qty 1
  Filled 2013-10-09: qty 2
  Filled 2013-10-09: qty 1
  Filled 2013-10-09 (×6): qty 2

## 2013-10-09 MED ORDER — PHENYLEPHRINE HCL 10 MG/ML IJ SOLN
INTRAMUSCULAR | Status: AC
  Filled 2013-10-09: qty 1

## 2013-10-09 MED ORDER — CEFAZOLIN SODIUM-DEXTROSE 2-3 GM-% IV SOLR
2.0000 g | INTRAVENOUS | Status: AC
  Administered 2013-10-09: 2 g via INTRAVENOUS

## 2013-10-09 MED ORDER — PHENYLEPHRINE HCL 10 MG/ML IJ SOLN
10.0000 mg | INTRAVENOUS | Status: DC | PRN
  Administered 2013-10-09: 10 ug/min via INTRAVENOUS

## 2013-10-09 MED ORDER — LORATADINE 10 MG PO TABS
10.0000 mg | ORAL_TABLET | Freq: Every day | ORAL | Status: DC
  Administered 2013-10-10 – 2013-10-12 (×3): 10 mg via ORAL
  Filled 2013-10-09 (×4): qty 1

## 2013-10-09 MED ORDER — FESOTERODINE FUMARATE ER 8 MG PO TB24
8.0000 mg | ORAL_TABLET | Freq: Every day | ORAL | Status: DC
  Administered 2013-10-10 – 2013-10-12 (×3): 8 mg via ORAL
  Filled 2013-10-09 (×4): qty 1

## 2013-10-09 MED ORDER — FAMOTIDINE 20 MG PO TABS
20.0000 mg | ORAL_TABLET | Freq: Every day | ORAL | Status: DC
  Administered 2013-10-09 – 2013-10-11 (×3): 20 mg via ORAL
  Filled 2013-10-09 (×4): qty 1

## 2013-10-09 MED ORDER — DEXAMETHASONE 6 MG PO TABS
10.0000 mg | ORAL_TABLET | Freq: Every day | ORAL | Status: AC
  Administered 2013-10-10: 10:00:00 10 mg via ORAL
  Filled 2013-10-09: qty 1

## 2013-10-09 MED ORDER — SODIUM CHLORIDE 0.9 % IR SOLN
Status: DC | PRN
  Administered 2013-10-09: 1000 mL

## 2013-10-09 MED ORDER — EPHEDRINE SULFATE 50 MG/ML IJ SOLN
INTRAMUSCULAR | Status: AC
  Filled 2013-10-09: qty 1

## 2013-10-09 MED ORDER — METHOCARBAMOL 500 MG PO TABS
500.0000 mg | ORAL_TABLET | Freq: Four times a day (QID) | ORAL | Status: DC | PRN
  Administered 2013-10-10 – 2013-10-12 (×4): 500 mg via ORAL
  Filled 2013-10-09 (×4): qty 1

## 2013-10-09 MED ORDER — HYDROXYZINE HCL 25 MG PO TABS
25.0000 mg | ORAL_TABLET | Freq: Every day | ORAL | Status: DC
  Administered 2013-10-09 – 2013-10-11 (×3): 25 mg via ORAL
  Filled 2013-10-09 (×4): qty 1

## 2013-10-09 MED ORDER — PROPOFOL 10 MG/ML IV BOLUS
INTRAVENOUS | Status: AC
  Filled 2013-10-09: qty 20

## 2013-10-09 MED ORDER — MIDAZOLAM HCL 2 MG/2ML IJ SOLN
INTRAMUSCULAR | Status: AC
  Filled 2013-10-09: qty 2

## 2013-10-09 MED ORDER — VANCOMYCIN HCL 1000 MG IV SOLR
1500.0000 mg | INTRAVENOUS | Status: DC | PRN
  Administered 2013-10-09: 1500 mg via INTRAVENOUS

## 2013-10-09 MED ORDER — CEFAZOLIN SODIUM-DEXTROSE 2-3 GM-% IV SOLR
INTRAVENOUS | Status: AC
  Filled 2013-10-09: qty 50

## 2013-10-09 MED ORDER — KETOROLAC TROMETHAMINE 15 MG/ML IJ SOLN
INTRAMUSCULAR | Status: AC
  Filled 2013-10-09: qty 1

## 2013-10-09 MED ORDER — FUROSEMIDE 20 MG PO TABS
20.0000 mg | ORAL_TABLET | Freq: Every day | ORAL | Status: DC
  Administered 2013-10-10 – 2013-10-12 (×3): 20 mg via ORAL
  Filled 2013-10-09 (×3): qty 1

## 2013-10-09 MED ORDER — DEXAMETHASONE SODIUM PHOSPHATE 10 MG/ML IJ SOLN
10.0000 mg | Freq: Once | INTRAMUSCULAR | Status: AC
  Administered 2013-10-09: 10 mg via INTRAVENOUS

## 2013-10-09 MED ORDER — CITALOPRAM HYDROBROMIDE 10 MG PO TABS
10.0000 mg | ORAL_TABLET | Freq: Every day | ORAL | Status: DC
  Administered 2013-10-09 – 2013-10-11 (×3): 10 mg via ORAL
  Filled 2013-10-09 (×4): qty 1

## 2013-10-09 MED ORDER — ONDANSETRON HCL 4 MG PO TABS
4.0000 mg | ORAL_TABLET | Freq: Four times a day (QID) | ORAL | Status: DC | PRN
  Administered 2013-10-10 (×2): 4 mg via ORAL
  Filled 2013-10-09: qty 1

## 2013-10-09 MED ORDER — MENTHOL 3 MG MT LOZG: 1.0000 | LOZENGE | OROMUCOSAL | Status: DC | PRN

## 2013-10-09 MED ORDER — MORPHINE SULFATE 2 MG/ML IJ SOLN: 1.0000 mg | INTRAMUSCULAR | Status: DC | PRN

## 2013-10-09 MED ORDER — ACETAMINOPHEN 650 MG RE SUPP: 650.0000 mg | Freq: Four times a day (QID) | RECTAL | Status: DC | PRN

## 2013-10-09 MED ORDER — SODIUM CHLORIDE 0.9 % IV SOLN
INTRAVENOUS | Status: DC
  Administered 2013-10-09 – 2013-10-10 (×3): via INTRAVENOUS

## 2013-10-09 MED ORDER — ONDANSETRON HCL 4 MG/2ML IJ SOLN
4.0000 mg | Freq: Four times a day (QID) | INTRAMUSCULAR | Status: DC | PRN
  Filled 2013-10-09: qty 2

## 2013-10-09 MED ORDER — ONDANSETRON HCL 4 MG/2ML IJ SOLN
INTRAMUSCULAR | Status: DC | PRN
  Administered 2013-10-09: 4 mg via INTRAVENOUS

## 2013-10-09 MED ORDER — KETAMINE HCL 10 MG/ML IJ SOLN
INTRAMUSCULAR | Status: AC
  Filled 2013-10-09: qty 1

## 2013-10-09 MED ORDER — MEPERIDINE HCL 50 MG/ML IJ SOLN: 6.2500 mg | INTRAMUSCULAR | Status: DC | PRN

## 2013-10-09 MED ORDER — METHOCARBAMOL 100 MG/ML IJ SOLN
500.0000 mg | Freq: Four times a day (QID) | INTRAVENOUS | Status: DC | PRN
  Administered 2013-10-10: 500 mg via INTRAVENOUS
  Filled 2013-10-09: qty 5

## 2013-10-09 MED ORDER — CEFAZOLIN SODIUM-DEXTROSE 2-3 GM-% IV SOLR
2.0000 g | Freq: Four times a day (QID) | INTRAVENOUS | Status: AC
  Administered 2013-10-09 (×2): 2 g via INTRAVENOUS
  Filled 2013-10-09 (×2): qty 50

## 2013-10-09 MED ORDER — BISACODYL 10 MG RE SUPP: 10.0000 mg | Freq: Every day | RECTAL | Status: DC | PRN

## 2013-10-09 MED ORDER — 0.9 % SODIUM CHLORIDE (POUR BTL) OPTIME
TOPICAL | Status: DC | PRN
  Administered 2013-10-09: 1000 mL

## 2013-10-09 MED ORDER — BUPIVACAINE HCL 0.25 % IJ SOLN
INTRAMUSCULAR | Status: DC | PRN
  Administered 2013-10-09: 20 mL

## 2013-10-09 MED ORDER — METOCLOPRAMIDE HCL 10 MG PO TABS: 5.0000 mg | ORAL_TABLET | Freq: Three times a day (TID) | ORAL | Status: DC | PRN

## 2013-10-09 MED ORDER — SODIUM CHLORIDE 0.9 % IV SOLN
INTRAVENOUS | Status: DC
Start: 2013-10-09 — End: 2013-10-09

## 2013-10-09 MED ORDER — DEXAMETHASONE SODIUM PHOSPHATE 10 MG/ML IJ SOLN
10.0000 mg | Freq: Every day | INTRAMUSCULAR | Status: AC
Start: 2013-10-10 — End: 2013-10-10
  Filled 2013-10-09: qty 1

## 2013-10-09 MED ORDER — ACETAMINOPHEN 500 MG PO TABS
1000.0000 mg | ORAL_TABLET | Freq: Once | ORAL | Status: AC
  Administered 2013-10-09: 500 mg via ORAL
  Filled 2013-10-09: qty 2

## 2013-10-09 MED ORDER — TRANEXAMIC ACID 100 MG/ML IV SOLN
1000.0000 mg | INTRAVENOUS | Status: AC
  Administered 2013-10-09: 1000 mg via INTRAVENOUS
  Filled 2013-10-09: qty 10

## 2013-10-09 MED ORDER — RIVAROXABAN 10 MG PO TABS
10.0000 mg | ORAL_TABLET | Freq: Every day | ORAL | Status: DC
  Administered 2013-10-10 – 2013-10-12 (×3): 10 mg via ORAL
  Filled 2013-10-09 (×5): qty 1

## 2013-10-09 MED ORDER — DIPHENHYDRAMINE HCL 12.5 MG/5ML PO ELIX: 12.5000 mg | ORAL_SOLUTION | ORAL | Status: DC | PRN

## 2013-10-09 MED ORDER — FENTANYL CITRATE 0.05 MG/ML IJ SOLN: 25.0000 ug | INTRAMUSCULAR | Status: DC | PRN

## 2013-10-09 MED ORDER — ALLOPURINOL 300 MG PO TABS
300.0000 mg | ORAL_TABLET | Freq: Every day | ORAL | Status: DC
  Administered 2013-10-10 – 2013-10-12 (×3): 300 mg via ORAL
  Filled 2013-10-09 (×3): qty 1

## 2013-10-09 MED ORDER — PROPOFOL INFUSION 10 MG/ML OPTIME
INTRAVENOUS | Status: DC | PRN
  Administered 2013-10-09: 75 ug/kg/min via INTRAVENOUS

## 2013-10-09 MED ORDER — TRAMADOL HCL 50 MG PO TABS
50.0000 mg | ORAL_TABLET | Freq: Four times a day (QID) | ORAL | Status: DC | PRN
Start: 2013-10-09 — End: 2013-10-12

## 2013-10-09 MED ORDER — LIDOCAINE HCL (CARDIAC) 20 MG/ML IV SOLN
INTRAVENOUS | Status: AC
  Filled 2013-10-09: qty 5

## 2013-10-09 MED ORDER — KETOROLAC TROMETHAMINE 15 MG/ML IJ SOLN
7.5000 mg | Freq: Four times a day (QID) | INTRAMUSCULAR | Status: AC | PRN
  Administered 2013-10-09: 7.5 mg via INTRAVENOUS

## 2013-10-09 MED ORDER — VANCOMYCIN HCL IN DEXTROSE 1-5 GM/200ML-% IV SOLN
INTRAVENOUS | Status: AC
  Filled 2013-10-09: qty 200

## 2013-10-09 MED ORDER — OMEPRAZOLE MAGNESIUM 20 MG PO TBEC
20.0000 mg | DELAYED_RELEASE_TABLET | Freq: Every day | ORAL | Status: DC
  Administered 2013-10-10 – 2013-10-12 (×3): 20 mg via ORAL
  Filled 2013-10-09 (×3): qty 1

## 2013-10-09 MED ORDER — SODIUM CHLORIDE 0.9 % IJ SOLN
INTRAMUSCULAR | Status: AC
  Filled 2013-10-09: qty 50

## 2013-10-09 MED ORDER — PROMETHAZINE HCL 25 MG/ML IJ SOLN: 6.2500 mg | INTRAMUSCULAR | Status: DC | PRN

## 2013-10-09 MED ORDER — SIMVASTATIN 20 MG PO TABS
20.0000 mg | ORAL_TABLET | Freq: Every day | ORAL | Status: DC
  Administered 2013-10-09 – 2013-10-12 (×4): 20 mg via ORAL
  Filled 2013-10-09 (×4): qty 1

## 2013-10-09 MED ORDER — HYDROXYZINE PAMOATE 25 MG PO CAPS
25.0000 mg | ORAL_CAPSULE | Freq: Every day | ORAL | Status: DC
  Filled 2013-10-09: qty 1

## 2013-10-09 SURGICAL SUPPLY — 56 items
BAG SPEC THK2 15X12 ZIP CLS (MISCELLANEOUS) ×1
BAG ZIPLOCK 12X15 (MISCELLANEOUS) ×3 IMPLANT
BANDAGE ELASTIC 6 VELCRO ST LF (GAUZE/BANDAGES/DRESSINGS) ×3 IMPLANT
BANDAGE ESMARK 6X9 LF (GAUZE/BANDAGES/DRESSINGS) ×1 IMPLANT
BLADE SAG 18X100X1.27 (BLADE) ×3 IMPLANT
BLADE SAW SGTL 11.0X1.19X90.0M (BLADE) ×3 IMPLANT
BNDG CMPR 9X6 STRL LF SNTH (GAUZE/BANDAGES/DRESSINGS) ×1
BNDG ESMARK 6X9 LF (GAUZE/BANDAGES/DRESSINGS) ×3
BOWL SMART MIX CTS (DISPOSABLE) ×3 IMPLANT
CAPT RP KNEE ×2 IMPLANT
CEMENT HV SMART SET (Cement) ×6 IMPLANT
CLOSURE WOUND 1/2 X4 (GAUZE/BANDAGES/DRESSINGS) ×1
CUFF TOURN SGL QUICK 34 (TOURNIQUET CUFF) ×3
CUFF TRNQT CYL 34X4X40X1 (TOURNIQUET CUFF) ×1 IMPLANT
DECANTER SPIKE VIAL GLASS SM (MISCELLANEOUS) ×3 IMPLANT
DRAPE EXTREMITY T 121X128X90 (DRAPE) ×3 IMPLANT
DRAPE POUCH INSTRU U-SHP 10X18 (DRAPES) ×3 IMPLANT
DRAPE U-SHAPE 47X51 STRL (DRAPES) ×3 IMPLANT
DRSG ADAPTIC 3X8 NADH LF (GAUZE/BANDAGES/DRESSINGS) ×3 IMPLANT
DURAPREP 26ML APPLICATOR (WOUND CARE) ×3 IMPLANT
ELECT REM PT RETURN 9FT ADLT (ELECTROSURGICAL) ×3
ELECTRODE REM PT RTRN 9FT ADLT (ELECTROSURGICAL) ×1 IMPLANT
EVACUATOR 1/8 PVC DRAIN (DRAIN) ×3 IMPLANT
FACESHIELD LNG OPTICON STERILE (SAFETY) ×15 IMPLANT
GLOVE BIO SURGEON STRL SZ7.5 (GLOVE) IMPLANT
GLOVE BIO SURGEON STRL SZ8 (GLOVE) ×3 IMPLANT
GLOVE BIOGEL PI IND STRL 8 (GLOVE) ×2 IMPLANT
GLOVE BIOGEL PI INDICATOR 8 (GLOVE) ×4
GLOVE SURG SS PI 6.5 STRL IVOR (GLOVE) IMPLANT
GOWN STRL REUS W/TWL LRG LVL3 (GOWN DISPOSABLE) ×3 IMPLANT
GOWN STRL REUS W/TWL XL LVL3 (GOWN DISPOSABLE) IMPLANT
HANDPIECE INTERPULSE COAX TIP (DISPOSABLE) ×3
IMMOBILIZER KNEE 20 (SOFTGOODS) ×3 IMPLANT
KIT BASIN OR (CUSTOM PROCEDURE TRAY) ×3 IMPLANT
MANIFOLD NEPTUNE II (INSTRUMENTS) ×3 IMPLANT
NDL SAFETY ECLIPSE 18X1.5 (NEEDLE) ×2 IMPLANT
NEEDLE HYPO 18GX1.5 SHARP (NEEDLE) ×6
NS IRRIG 1000ML POUR BTL (IV SOLUTION) ×3 IMPLANT
PACK TOTAL JOINT (CUSTOM PROCEDURE TRAY) ×3 IMPLANT
PAD ABD 8X10 STRL (GAUZE/BANDAGES/DRESSINGS) ×3 IMPLANT
PADDING CAST COTTON 6X4 STRL (CAST SUPPLIES) ×5 IMPLANT
POSITIONER SURGICAL ARM (MISCELLANEOUS) ×3 IMPLANT
SET HNDPC FAN SPRY TIP SCT (DISPOSABLE) ×1 IMPLANT
SPONGE GAUZE 4X4 12PLY (GAUZE/BANDAGES/DRESSINGS) ×3 IMPLANT
STRIP CLOSURE SKIN 1/2X4 (GAUZE/BANDAGES/DRESSINGS) ×3 IMPLANT
SUCTION FRAZIER 12FR DISP (SUCTIONS) ×3 IMPLANT
SUT MNCRL AB 4-0 PS2 18 (SUTURE) ×3 IMPLANT
SUT VIC AB 2-0 CT1 27 (SUTURE) ×9
SUT VIC AB 2-0 CT1 TAPERPNT 27 (SUTURE) ×3 IMPLANT
SUT VLOC 180 0 24IN GS25 (SUTURE) ×3 IMPLANT
SYR 20CC LL (SYRINGE) ×3 IMPLANT
SYR 50ML LL SCALE MARK (SYRINGE) ×3 IMPLANT
TOWEL OR 17X26 10 PK STRL BLUE (TOWEL DISPOSABLE) ×6 IMPLANT
TRAY FOLEY CATH 14FRSI W/METER (CATHETERS) ×3 IMPLANT
WATER STERILE IRR 1500ML POUR (IV SOLUTION) ×5 IMPLANT
WRAP KNEE MAXI GEL POST OP (GAUZE/BANDAGES/DRESSINGS) ×3 IMPLANT

## 2013-10-09 NOTE — Anesthesia Preprocedure Evaluation (Addendum)
Anesthesia Evaluation  Patient identified by MRN, date of birth, ID band Patient awake    Reviewed: Allergy & Precautions, H&P , NPO status , Patient's Chart, lab work & pertinent test results  Airway Mallampati: II TM Distance: >3 FB Neck ROM: Full    Dental no notable dental hx. (+) Teeth Intact   Pulmonary sleep apnea and Continuous Positive Airway Pressure Ventilation ,  breath sounds clear to auscultation  Pulmonary exam normal       Cardiovascular hypertension, Pt. on medications Rhythm:Regular Rate:Normal     Neuro/Psych negative neurological ROS  negative psych ROS   GI/Hepatic negative GI ROS, Neg liver ROS,   Endo/Other  Morbid obesity  Renal/GU negative Renal ROS  negative genitourinary   Musculoskeletal negative musculoskeletal ROS (+)   Abdominal   Peds negative pediatric ROS (+)  Hematology negative hematology ROS (+)   Anesthesia Other Findings   Reproductive/Obstetrics negative OB ROS                         Anesthesia Physical Anesthesia Plan  ASA: II  Anesthesia Plan: Spinal   Post-op Pain Management:    Induction: Intravenous  Airway Management Planned: Simple Face Mask  Additional Equipment:   Intra-op Plan:   Post-operative Plan: Extubation in OR  Informed Consent: I have reviewed the patients History and Physical, chart, labs and discussed the procedure including the risks, benefits and alternatives for the proposed anesthesia with the patient or authorized representative who has indicated his/her understanding and acceptance.   Dental advisory given  Plan Discussed with: CRNA  Anesthesia Plan Comments:         Anesthesia Quick Evaluation

## 2013-10-09 NOTE — Op Note (Signed)
Pre-operative diagnosis- Osteoarthritis  Left knee(s)  Post-operative diagnosis- Osteoarthritis Left knee(s)  Procedure-  Left  Total Knee Arthroplasty  Surgeon- Dione Plover. Tyanne Derocher, MD  Assistant- Arlee Muslim, PA-C   Anesthesia-  Spinal EBL-* No blood loss amount entered *  Drains Hemovac  Tourniquet time-  Total Tourniquet Time Documented: Thigh (Left) - 33 minutes Total: Thigh (Left) - 33 minutes    Complications- None  Condition-PACU - hemodynamically stable.   Brief Clinical Note  Melinda Kerr is a 79 y.o. year old female with end stage OA of her left knee with progressively worsening pain and dysfunction. She has constant pain, with activity and at rest and significant functional deficits with difficulties even with ADLs. She has had extensive non-op management including analgesics, injections of cortisone and viscosupplements, and home exercise program, but remains in significant pain with significant dysfunction. Radiographs show bone on bone arthritis medial and patellofemoral. She presents now for left Total Knee Arthroplasty.    Procedure in detail---   The patient is brought into the operating room and positioned supine on the operating table. After successful administration of  Spinal,   a tourniquet is placed high on the  Left thigh(s) and the lower extremity is prepped and draped in the usual sterile fashion. Time out is performed by the operating team and then the  Left lower extremity is wrapped in Esmarch, knee flexed and the tourniquet inflated to 300 mmHg.       A midline incision is made with a ten blade through the subcutaneous tissue to the level of the extensor mechanism. A fresh blade is used to make a medial parapatellar arthrotomy. Soft tissue over the proximal medial tibia is subperiosteally elevated to the joint line with a knife and into the semimembranosus bursa with a Cobb elevator. Soft tissue over the proximal lateral tibia is elevated with attention  being paid to avoiding the patellar tendon on the tibial tubercle. The patella is everted, knee flexed 90 degrees and the ACL and PCL are removed. Findings are bone on bone medial and patellofemoral with large medial osteophytes.        The drill is used to create a starting hole in the distal femur and the canal is thoroughly irrigated with sterile saline to remove the fatty contents. The 5 degree Left  valgus alignment guide is placed into the femoral canal and the distal femoral cutting block is pinned to remove 10 mm off the distal femur. Resection is made with an oscillating saw.      The tibia is subluxed forward and the menisci are removed. The extramedullary alignment guide is placed referencing proximally at the medial aspect of the tibial tubercle and distally along the second metatarsal axis and tibial crest. The block is pinned to remove 79mm off the more deficient medial   side. Resection is made with an oscillating saw. Size 3is the most appropriate size for the tibia and the proximal tibia is prepared with the modular drill and keel punch for that size.      The femoral sizing guide is placed and size 4 narrow is most appropriate. Rotation is marked off the epicondylar axis and confirmed by creating a rectangular flexion gap at 90 degrees. The size 4 cutting block is pinned in this rotation and the anterior, posterior and chamfer cuts are made with the oscillating saw. The intercondylar block is then placed and that cut is made.      Trial size 3 tibial component, trial  size 4 narrow posterior stabilized femur and a 12.5  mm posterior stabilized rotating platform insert trial is placed. Full extension is achieved with excellent varus/valgus and anterior/posterior balance throughout full range of motion. The patella is everted and thickness measured to be 22  mm. Free hand resection is taken to 12 mm, a 38 template is placed, lug holes are drilled, trial patella is placed, and it tracks normally.  Osteophytes are removed off the posterior femur with the trial in place. All trials are removed and the cut bone surfaces prepared with pulsatile lavage. Cement is mixed and once ready for implantation, the size 3 tibial implant, size  4 narrow posterior stabilized femoral component, and the size 38 patella are cemented in place and the patella is held with the clamp. The trial insert is placed and the knee held in full extension. The Exparel (20 ml mixed with 30 ml saline) and .25% Bupivicaine, are injected into the extensor mechanism, posterior capsule, medial and lateral gutters and subcutaneous tissues.  All extruded cement is removed and once the cement is hard the permanent 12.5 mm posterior stabilized rotating platform insert is placed into the tibial tray.      The wound is copiously irrigated with saline solution and the extensor mechanism closed over a hemovac drain with #1 PDS suture. The tourniquet is released for a total tourniquet time of 33  minutes. Flexion against gravity is 135 degrees and the patella tracks normally. Subcutaneous tissue is closed with 2.0 vicryl and subcuticular with running 4.0 Monocryl. The incision is cleaned and dried and steri-strips and a bulky sterile dressing are applied. The limb is placed into a knee immobilizer and the patient is awakened and transported to recovery in stable condition.      Please note that a surgical assistant was a medical necessity for this procedure in order to perform it in a safe and expeditious manner. Surgical assistant was necessary to retract the ligaments and vital neurovascular structures to prevent injury to them and also necessary for proper positioning of the limb to allow for anatomic placement of the prosthesis.   Dione Plover Cambrie Sonnenfeld, MD    10/09/2013, 9:25 AM

## 2013-10-09 NOTE — Transfer of Care (Signed)
Immediate Anesthesia Transfer of Care Note  Patient: Melinda Kerr  Procedure(s) Performed: Procedure(s): LEFT TOTAL KNEE ARTHROPLASTY (Left)  Patient Location: PACU  Anesthesia Type:Spinal  Level of Consciousness: awake, alert  and oriented  Airway & Oxygen Therapy: Patient Spontanous Breathing and Patient connected to face mask oxygen  Post-op Assessment: Report given to PACU RN and Post -op Vital signs reviewed and stable  Post vital signs: Reviewed and stable  Complications: No apparent anesthesia complications

## 2013-10-09 NOTE — Progress Notes (Signed)
Utilization review completed.  

## 2013-10-09 NOTE — Interval H&P Note (Signed)
History and Physical Interval Note:  10/09/2013 7:13 AM  Melinda Kerr  has presented today for surgery, with the diagnosis of osteoarthritis of the left knee  The various methods of treatment have been discussed with the patient and family. After consideration of risks, benefits and other options for treatment, the patient has consented to  Procedure(s): LEFT TOTAL KNEE ARTHROPLASTY (Left) as a surgical intervention .  The patient's history has been reviewed, patient examined, no change in status, stable for surgery.  I have reviewed the patient's chart and labs.  Questions were answered to the patient's satisfaction.     [name]

## 2013-10-09 NOTE — Anesthesia Procedure Notes (Signed)
Spinal  Patient location during procedure: OR Staffing Anesthesiologist: Montez Hageman Performed by: anesthesiologist  Preanesthetic Checklist Completed: patient identified, site marked, surgical consent, pre-op evaluation, timeout performed, IV checked, risks and benefits discussed and monitors and equipment checked Spinal Block Patient position: sitting Prep: Betadine Patient monitoring: heart rate, continuous pulse ox and blood pressure Approach: right paramedian Location: L3-4 Injection technique: single-shot Needle Needle type: Spinocan  Needle gauge: 22 G Needle length: 9 cm Additional Notes Expiration date of kit checked and confirmed. Patient tolerated procedure well, without complications.

## 2013-10-09 NOTE — Evaluation (Signed)
Physical Therapy Evaluation Patient Details Name: Melinda Kerr MRN: [id number] DOB: May 20, 1935 Today's Date: 10/09/2013 Time: 7169-6789 PT Time Calculation (min): 24 min  PT Assessment / Plan / Recommendation History of Present Illness  s/p L TKR  Clinical Impression  Pt is s/p L TKA resulting in the deficits listed below (see PT Problem List).  Pt will benefit from skilled PT to increase their independence and safety with mobility to allow discharge to the venue listed below.  Pt ambulated in hallway short distance POD #0.  Pt plans to d/c to [facility] for rehab due to lives alone.  Pt reports being very involved in community outings with her friends.     PT Assessment  Patient needs continued PT services    Follow Up Recommendations  SNF    Does the patient have the potential to tolerate intense rehabilitation      Barriers to Discharge        Equipment Recommendations  None recommended by PT    Recommendations for Other Services     Frequency 7X/week    Precautions / Restrictions Precautions Precautions: Knee Required Braces or Orthoses: Knee Immobilizer - Left Knee Immobilizer - Left: Discontinue once straight leg raise with < 10 degree lag Restrictions Other Position/Activity Restrictions: WBAT   Pertinent Vitals/Pain Pt reports no pain at rest, premedicated, ice packs applied end of session      Mobility  Bed Mobility Overal bed mobility: Needs Assistance Bed Mobility: Supine to Sit Supine to sit: Min assist General bed mobility comments: verbal cues for technique, assist for L LE Transfers Overall transfer level: Needs assistance Equipment used: Rolling walker (2 wheeled) Transfers: Sit to/from Stand Sit to Stand: Min assist General transfer comment: verbal cues for safe technique, assist to rise and control descent Ambulation/Gait Ambulation/Gait assistance: Min assist Ambulation Distance (Feet): 40 Feet Assistive device: Rolling walker (2  wheeled) Gait Pattern/deviations: Step-to pattern;Antalgic Gait velocity: decr General Gait Details: verbal cues for sequence , RW distance, step length, and posture    Exercises     PT Diagnosis: Difficulty walking;Acute pain  PT Problem List: Decreased strength;Decreased range of motion;Decreased mobility;Decreased knowledge of precautions;Pain;Decreased knowledge of use of DME PT Treatment Interventions: Gait training;DME instruction;Functional mobility training;Therapeutic activities;Therapeutic exercise;Patient/family education     PT Goals(Current goals can be found in the care plan section) Acute Rehab PT Goals PT Goal Formulation: With patient Time For Goal Achievement: 10/16/13 Potential to Achieve Goals: Good  Visit Information  Last PT Received On: 10/09/13 Assistance Needed: +1 History of Present Illness: s/p L TKR       Prior Functioning  Home Living Family/patient expects to be discharged to:: Skilled nursing facility Living Arrangements: Alone Prior Function Level of Independence: Independent with assistive device(s) Comments: states she used SPC prior to surgery Communication Communication: No difficulties    Cognition  Cognition Arousal/Alertness: Awake/alert Behavior During Therapy: WFL for tasks assessed/performed Overall Cognitive Status: Within Functional Limits for tasks assessed    Extremity/Trunk Assessment Lower Extremity Assessment Lower Extremity Assessment: LLE deficits/detail LLE Deficits / Details: fair quad contraction, unable to perform SLR, maintained KI   Balance    End of Session PT - End of Session Equipment Utilized During Treatment: Left knee immobilizer Activity Tolerance: Patient tolerated treatment well Patient left: with call bell/phone within reach;in chair;with family/visitor present Nurse Communication: Mobility status CPM Left Knee CPM Left Knee: Off Left Knee Flexion (Degrees): 40 Left Knee Extension (Degrees): 10   GP  Taleyah Hillman,KATHrine E 10/09/2013, 4:03 PM Carmelia Bake, PT, DPT 10/09/2013 Pager: [phone]

## 2013-10-09 NOTE — Anesthesia Postprocedure Evaluation (Signed)
  Anesthesia Post-op Note  Patient: Melinda Kerr  Procedure(s) Performed: Procedure(s) (LRB): LEFT TOTAL KNEE ARTHROPLASTY (Left)  Patient Location: PACU  Anesthesia Type: Spinal  Level of Consciousness: awake and alert   Airway and Oxygen Therapy: Patient Spontanous Breathing  Post-op Pain: mild  Post-op Assessment: Post-op Vital signs reviewed, Patient's Cardiovascular Status Stable, Respiratory Function Stable, Patent Airway and No signs of Nausea or vomiting  Last Vitals:  Filed Vitals:   10/09/13 1454  BP: 102/67  Pulse: 77  Temp: 36.9 C  Resp: 16    Post-op Vital Signs: stable   Complications: No apparent anesthesia complications

## 2013-10-09 NOTE — Preoperative (Signed)
Beta Blockers   Reason not to administer Beta Blockers:Not Applicable 

## 2013-10-10 LAB — CBC
HEMATOCRIT: 34.7 % — AB (ref 36.0–46.0)
Hemoglobin: 11.6 g/dL — ABNORMAL LOW (ref 12.0–15.0)
MCH: 31 pg (ref 26.0–34.0)
MCHC: 33.4 g/dL (ref 30.0–36.0)
MCV: 92.8 fL (ref 78.0–100.0)
Platelets: 206 10*3/uL (ref 150–400)
RBC: 3.74 MIL/uL — ABNORMAL LOW (ref 3.87–5.11)
RDW: 12.9 % (ref 11.5–15.5)
WBC: 13.5 10*3/uL — AB (ref 4.0–10.5)

## 2013-10-10 LAB — BASIC METABOLIC PANEL
BUN: 18 mg/dL (ref 6–23)
CO2: 26 mEq/L (ref 19–32)
CREATININE: 0.81 mg/dL (ref 0.50–1.10)
Calcium: 8.8 mg/dL (ref 8.4–10.5)
Chloride: 105 mEq/L (ref 96–112)
GFR calc non Af Amer: 69 mL/min — ABNORMAL LOW (ref >90)
GFR, EST AFRICAN AMERICAN: 80 mL/min — AB (ref >90)
Glucose, Bld: 142 mg/dL — ABNORMAL HIGH (ref 70–99)
Potassium: 4.8 mEq/L (ref 3.7–5.3)
Sodium: 140 mEq/L (ref 137–147)

## 2013-10-10 NOTE — Progress Notes (Signed)
Clinical Social Work Department BRIEF PSYCHOSOCIAL ASSESSMENT 10/10/2013  Patient:  VEGAS, COFFIN     Account Number:  [id number]     Admit date:  10/09/2013  Clinical Social Worker:  Lacie Scotts  Date/Time:  10/10/2013 03:59 PM  Referred by:    Date Referred:  10/10/2013 Referred for  SNF Placement   Other Referral:   Interview type:  Patient Other interview type:    PSYCHOSOCIAL DATA Living Status:  FAMILY Admitted from facility:   Level of care:   Primary support name:  [name] Doree Fudge Primary support relationship to patient:  niece Degree of support available:   supportive    CURRENT CONCERNS Current Concerns  Post-Acute Placement   Other Concerns:    SOCIAL WORK ASSESSMENT / PLAN Pt is a 79 yr old female living at home prior to hospitalization. CSW met with pt to assist with d/c planning . Pt has made prior arrangements to have ST  Rehab at [facility] following hospital d/c. CSW has contacted SNF and d/c plan has been confirmed. CSW will continue to follow to assist with d/c planning.   Assessment/plan status:  Psychosocial Support/Ongoing Assessment of Needs Other assessment/ plan:   Information/referral to community resources:   Insurance coverage for SNF and ambulance transport reviewed.    PATIENT'S/FAMILY'S RESPONSE TO PLAN OF CARE: Pt is looking forward to having rehab at [facility].    Werner Lean LCSW [phone]

## 2013-10-10 NOTE — Progress Notes (Signed)
   Subjective: 1 Day Post-Op Procedure(s) (LRB): LEFT TOTAL KNEE ARTHROPLASTY (Left) Patient reports pain as mild.   Patient seen in rounds with Dr. Wynelle Link.  Not much sleep last night. Patient is well, and has had no acute complaints or problems.  She walked 40 feet yesterday. We will start therapy today.  Plan is to go [facility] after hospital stay.  Objective: Vital signs in last 24 hours: Temp:  [97.4 F (36.3 C)-98.5 F (36.9 C)] 98.2 F (36.8 C) (01/13 0635) Pulse Rate:  [73-93] 93 (01/13 0635) Resp:  [14-19] 16 (01/13 0635) BP: (97-127)/(56-78) 125/78 mmHg (01/13 0635) SpO2:  [92 %-100 %] 99 % (01/13 0635) Weight:  [103.42 kg (228 lb)] 103.42 kg (228 lb) (01/13 0021)  Intake/Output from previous day:  Intake/Output Summary (Last 24 hours) at 10/10/13 0756 Last data filed at 10/10/13 9379  Gross per 24 hour  Intake 4992.5 ml  Output   2592 ml  Net 2400.5 ml    Intake/Output this shift: UOP 1050 since MN +2400  Labs:  Recent Labs  10/10/13 0531  HGB 11.6*    Recent Labs  10/10/13 0531  WBC 13.5*  RBC 3.74*  HCT 34.7*  PLT 206    Recent Labs  10/10/13 0531  NA 140  K 4.8  CL 105  CO2 26  BUN 18  CREATININE 0.81  GLUCOSE 142*  CALCIUM 8.8   No results found for this basename: LABPT, INR,  in the last 72 hours  EXAM General - Patient is Alert, Appropriate and Oriented Extremity - Neurovascular intact Sensation intact distally Dorsiflexion/Plantar flexion intact Dressing - dressing C/D/I Motor Function - intact, moving foot and toes well on exam.  Hemovac pulled without difficulty.  Past Medical History  Diagnosis Date  . Hyperlipidemia   . Depression   . OSA on CPAP   . Spondylosis     with scoliosis  . Phlebitis   . Esophageal reflux   . Morbid obesity with BMI of 40.0-44.9, adult   . Hypertension     Assessment/Plan: 1 Day Post-Op Procedure(s) (LRB): LEFT TOTAL KNEE ARTHROPLASTY (Left) Principal Problem:   OA  (osteoarthritis) of knee  Estimated body mass index is 44.53 kg/(m^2) as calculated from the following:   Height as of this encounter: 5' (1.524 m).   Weight as of this encounter: 103.42 kg (228 lb). Advance diet Up with therapy Discharge to SNF  DVT Prophylaxis - Xarelto Weight-Bearing as tolerated to left leg D/C O2 and Pulse OX and try on Room [address]  Mickel Crow 10/10/2013, 7:56 AM

## 2013-10-10 NOTE — Progress Notes (Signed)
OT Cancellation Note  Patient Details Name: SUAN PYEATT MRN: [id number] DOB: December 01, 1934   Cancelled Treatment:    Reason Eval/Treat Not Completed: Other (comment)  Pt plans snf for rehab.  Will defer OT eval to that venue.  Javeon Macmurray 10/10/2013, 7:37 AM Lesle Chris, OTR/L [phone] 10/10/2013

## 2013-10-10 NOTE — Care Management Note (Signed)
    Page 1 of 1   10/10/2013     4:16:28 PM   CARE MANAGEMENT NOTE 10/10/2013  Patient:  Melinda Kerr, Melinda Kerr   Account Number:  [id number]  Date Initiated:  10/10/2013  Documentation initiated by:  Sherrin Daisy  Subjective/Objective Assessment:   DX TOTAL LEFT KNEE REPLACEMNT     Action/Plan:   PLANS ARE FOR SNF REHAB   Anticipated DC Date:  10/10/2013   Anticipated DC Plan:  SKILLED NURSING FACILITY  In-house referral  Clinical Social Worker      DC Planning Services  CM consult      Choice offered to / List presented to:             Status of service:  Completed, signed off Medicare Important Message given?   (If response is "NO", the following Medicare IM given date fields will be blank) Date Medicare IM given:   Date Additional Medicare IM given:    Discharge Disposition:    Per UR Regulation:    If discussed at Long Length of Stay Meetings, dates discussed:    Comments:

## 2013-10-10 NOTE — Progress Notes (Signed)
Clinical Social Work Department CLINICAL SOCIAL WORK PLACEMENT NOTE 10/10/2013  Patient:  Melinda Kerr, Melinda Kerr  Account Number:  [id number] Admit date:  10/09/2013  Clinical Social Worker:  Werner Lean, LCSW  Date/time:  10/10/2013 04:01 PM  Clinical Social Work is seeking post-discharge placement for this patient at the following level of care:   SKILLED NURSING   (*CSW will update this form in Epic as items are completed)     Patient/family provided with [place] Department of Clinical Social Work's list of facilities offering this level of care within the geographic area requested by the patient (or if unable, by the patient's family).  10/10/2013  Patient/family informed of their freedom to choose among providers that offer the needed level of care, that participate in Medicare, Medicaid or managed care program needed by the patient, have an available bed and are willing to accept the patient.    Patient/family informed of [facility]' ownership interest in [facility], as well as of the fact that they are under no obligation to receive care at this facility.  PASARR submitted to EDS on 10/10/2013 PASARR number received from EDS on 10/10/2013  FL2 transmitted to all facilities in geographic area requested by pt/family on  10/10/2013 FL2 transmitted to all facilities within larger geographic area on   Patient informed that his/her managed care company has contracts with or will negotiate with  certain facilities, including the following:     Patient/family informed of bed offers received:  10/10/2013 Patient chooses bed at [place] Physician recommends and patient chooses bed at    Patient to be transferred to [place] on   Patient to be transferred to facility by   The following physician request were entered in Epic:   Additional Comments:   Werner Lean LCSW [phone]

## 2013-10-10 NOTE — Progress Notes (Signed)
Physical Therapy Treatment Patient Details Name: Melinda Kerr MRN: [id number] DOB: 03-09-1935 Today's Date: 10/10/2013 Time: 4081-4481 PT Time Calculation (min): 32 min  PT Assessment / Plan / Recommendation  History of Present Illness s/p L TKR   PT Comments   Pt assisted to [facility] quickly due to urgency and then ambulated in hallway.  Pt only able to tolerate short distance due to nausea and vomiting so recliner brought behind pt.  Pt felt better upon resting in recliner and agreeable to perform LE exercises reclined in recliner.   Follow Up Recommendations  SNF     Does the patient have the potential to tolerate intense rehabilitation     Barriers to Discharge        Equipment Recommendations  None recommended by PT    Recommendations for Other Services    Frequency 7X/week   Progress towards PT Goals Progress towards PT goals: Progressing toward goals  Plan Current plan remains appropriate    Precautions / Restrictions Precautions Precautions: Knee Required Braces or Orthoses: Knee Immobilizer - Left Knee Immobilizer - Left: Discontinue once straight leg raise with < 10 degree lag Restrictions Other Position/Activity Restrictions: WBAT   Pertinent Vitals/Pain Reports moderate pain with activity, repositioned, premedicated    Mobility  Bed Mobility Overal bed mobility: Needs Assistance Bed Mobility: Supine to Sit Supine to sit: Min assist General bed mobility comments: verbal cues for technique, assist for L LE Transfers Overall transfer level: Needs assistance Equipment used: Rolling walker (2 wheeled) Transfers: Sit to/from [facility] Sit to Stand: Mod assist Stand pivot transfers: Mod assist General transfer comment: increased assist due to pt rushing to use BSC, required assist for safety, pt also not wearing KI, which was applied after transfer from [facility] to recliner prior to ambulation and pt educated to use KI when  OOB Ambulation/Gait Ambulation/Gait assistance: Min assist Ambulation Distance (Feet): 10 Feet Assistive device: Rolling walker (2 wheeled) Gait Pattern/deviations: Step-to pattern;Trunk flexed;Antalgic Gait velocity: decr General Gait Details: verbal cues for sequence , RW distance, step length, and posture; pt limited by nausea and vomiting, recliner brought behind pt (she states she didn't get her GERD meds prior to breakfast)    Exercises Total Joint Exercises Ankle Circles/Pumps: AROM;15 reps;Both Quad Sets: AROM;Both;15 reps Short Arc QuadSinclair Ship;Left;15 reps Heel Slides: AAROM;Left;15 reps Hip ABduction/ADduction: AAROM;15 reps;Left   PT Diagnosis:    PT Problem List:   PT Treatment Interventions:     PT Goals (current goals can now be found in the care plan section)    Visit Information  Last PT Received On: 10/10/13 Assistance Needed: +1 History of Present Illness: s/p L TKR    Subjective Data      Cognition  Cognition Arousal/Alertness: Awake/alert Behavior During Therapy: WFL for tasks assessed/performed Overall Cognitive Status: Within Functional Limits for tasks assessed    Balance     End of Session PT - End of Session Equipment Utilized During Treatment: Left knee immobilizer Activity Tolerance: Other (comment) (limited by nausea) Patient left: in chair;with call bell/phone within reach;with family/visitor present Nurse Communication: Mobility status (gave nausea meds)   GP     [name],KATHrine E 10/10/2013, 1:19 PM Carmelia Bake, PT, DPT 10/10/2013 Pager: [phone]

## 2013-10-11 LAB — CBC
HEMATOCRIT: 35.6 % — AB (ref 36.0–46.0)
Hemoglobin: 11.9 g/dL — ABNORMAL LOW (ref 12.0–15.0)
MCH: 31.4 pg (ref 26.0–34.0)
MCHC: 33.4 g/dL (ref 30.0–36.0)
MCV: 93.9 fL (ref 78.0–100.0)
Platelets: 202 10*3/uL (ref 150–400)
RBC: 3.79 MIL/uL — ABNORMAL LOW (ref 3.87–5.11)
RDW: 13 % (ref 11.5–15.5)
WBC: 9.9 10*3/uL (ref 4.0–10.5)

## 2013-10-11 LAB — BASIC METABOLIC PANEL
BUN: 20 mg/dL (ref 6–23)
CALCIUM: 7.9 mg/dL — AB (ref 8.4–10.5)
CO2: 24 mEq/L (ref 19–32)
CREATININE: 0.77 mg/dL (ref 0.50–1.10)
Chloride: 101 mEq/L (ref 96–112)
GFR, EST NON AFRICAN AMERICAN: 80 mL/min — AB (ref >90)
Glucose, Bld: 154 mg/dL — ABNORMAL HIGH (ref 70–99)
Potassium: 4.9 mEq/L (ref 3.7–5.3)
Sodium: 134 mEq/L — ABNORMAL LOW (ref 137–147)

## 2013-10-11 NOTE — Progress Notes (Signed)
RT placed pt on CPAP. Patien brought own mask and tubing . Patients home regimine setting at hom is 12 cmH2O. Filled water chamber for humidification. Patient tolerating well at this time.

## 2013-10-11 NOTE — Progress Notes (Signed)
   Subjective: 2 Days Post-Op Procedure(s) (LRB): LEFT TOTAL KNEE ARTHROPLASTY (Left) Patient reports pain as mild.   Patient seen in rounds for Dr. Wynelle Link. Patient is well, but has had some minor complaints of pain in the knee, requiring pain medications Plan is to go Skilled nursing facility after hospital stay.  Objective: Vital signs in last 24 hours: Temp:  [98.3 F (36.8 C)-98.6 F (37 C)] 98.3 F (36.8 C) (01/14 1500) Pulse Rate:  [100-103] 100 (01/14 1500) Resp:  [16-18] 16 (01/14 1500) BP: (126-127)/(68-89) 126/68 mmHg (01/14 1500) SpO2:  [90 %-96 %] 96 % (01/14 1500)  Intake/Output from previous day:  Intake/Output Summary (Last 24 hours) at 10/11/13 1558 Last data filed at 10/11/13 0900  Gross per 24 hour  Intake    610 ml  Output   1700 ml  Net  -1090 ml    Intake/Output this shift: Total I/O In: 240 [P.O.:240] Out: 500 [Urine:500]  Labs:  Recent Labs  10/10/13 0531 10/11/13 0519  HGB 11.6* 11.9*    Recent Labs  10/10/13 0531 10/11/13 0519  WBC 13.5* 9.9  RBC 3.74* 3.79*  HCT 34.7* 35.6*  PLT 206 202    Recent Labs  10/10/13 0531 10/11/13 0519  NA 140 134*  K 4.8 4.9  CL 105 101  CO2 26 24  BUN 18 20  CREATININE 0.81 0.77  GLUCOSE 142* 154*  CALCIUM 8.8 7.9*   No results found for this basename: LABPT, INR,  in the last 72 hours  EXAM General - Patient is Alert and Appropriate Extremity - Neurovascular intact Sensation intact distally Dorsiflexion/Plantar flexion intact Dressing/Incision - clean, dry, no drainage Motor Function - intact, moving foot and toes well on exam.   Past Medical History  Diagnosis Date  . Hyperlipidemia   . Depression   . OSA on CPAP   . Spondylosis     with scoliosis  . Phlebitis   . Esophageal reflux   . Morbid obesity with BMI of 40.0-44.9, adult   . Hypertension     Assessment/Plan: 2 Days Post-Op Procedure(s) (LRB): LEFT TOTAL KNEE ARTHROPLASTY (Left) Principal Problem:   OA  (osteoarthritis) of knee  Estimated body mass index is 44.53 kg/(m^2) as calculated from the following:   Height as of this encounter: 5' (1.524 m).   Weight as of this encounter: 103.42 kg (228 lb). Up with therapy Discharge to SNF  DVT Prophylaxis - Xarelto Weight-Bearing as tolerated to left leg  PERKINS, ALEXZANDREW 10/11/2013, 3:58 PM

## 2013-10-11 NOTE — Progress Notes (Signed)
Physical Therapy Treatment Patient Details Name: Melinda Kerr MRN: [id number] DOB: 07/21/1935 Today's Date: 10/11/2013 Time: 1601-0932 PT Time Calculation (min): 27 min  PT Assessment / Plan / Recommendation  History of Present Illness s/p L TKR   PT Comments   Pt feeling much better today and ambulated in hallway.  Pt also performed exercises in recliner.    Follow Up Recommendations  SNF     Does the patient have the potential to tolerate intense rehabilitation     Barriers to Discharge        Equipment Recommendations  None recommended by PT    Recommendations for Other Services    Frequency 7X/week   Progress towards PT Goals Progress towards PT goals: Progressing toward goals  Plan Current plan remains appropriate    Precautions / Restrictions Precautions Precautions: Knee Required Braces or Orthoses: Knee Immobilizer - Left Knee Immobilizer - Left: Discontinue once straight leg raise with < 10 degree lag Restrictions Other Position/Activity Restrictions: WBAT   Pertinent Vitals/Pain Pt reports minimal pain.    Mobility  Bed Mobility General bed mobility comments: pt up in recliner on arrival Transfers Overall transfer level: Needs assistance Equipment used: Rolling walker (2 wheeled) Transfers: Sit to/from Stand Sit to Stand: Min assist Stand pivot transfers: Min assist General transfer comment: verbal cues for safe technique, required rocking motion to stand with assist also provided Ambulation/Gait Ambulation/Gait assistance: Min assist;Min guard Ambulation Distance (Feet): 80 Feet Assistive device: Rolling walker (2 wheeled) Gait Pattern/deviations: Step-to pattern;Trunk flexed;Antalgic Gait velocity: decr General Gait Details: verbal cues for sequence , RW distance, step length, and posture; pt reports feeling much better today    Exercises Total Joint Exercises Ankle Circles/Pumps: AROM;15 reps;Both;Supine Quad Sets: AROM;Both;15  reps;Supine Towel Squeeze: AROM;Both;15 reps;Supine Short Arc Quad: AAROM;Left;15 reps;Supine Heel Slides: AAROM;Left;15 reps;Supine Hip ABduction/ADduction: AAROM;15 reps;Left;Supine   PT Diagnosis:    PT Problem List:   PT Treatment Interventions:     PT Goals (current goals can now be found in the care plan section)    Visit Information  Last PT Received On: 10/11/13 Assistance Needed: +1 History of Present Illness: s/p L TKR    Subjective Data      Cognition  Cognition Arousal/Alertness: Awake/alert Behavior During Therapy: WFL for tasks assessed/performed Overall Cognitive Status: Within Functional Limits for tasks assessed    Balance     End of Session PT - End of Session Equipment Utilized During Treatment: Left knee immobilizer Activity Tolerance: Patient tolerated treatment well Patient left: in chair;with call bell/phone within reach   GP     Melinda Kerr,Melinda Kerr 10/11/2013, 1:56 PM Melinda Kerr, PT, DPT 10/11/2013 Pager: [phone]

## 2013-10-11 NOTE — Discharge Instructions (Addendum)
° °Dr. Frank Aluisio °Total Joint Specialist °[facility] °[address] °[place], [place] [address] °[phone] ° °TOTAL KNEE REPLACEMENT POSTOPERATIVE DIRECTIONS ° ° ° °Knee Rehabilitation, Guidelines Following Surgery  °Results after knee surgery are often greatly improved when you follow the exercise, range of motion and muscle strengthening exercises prescribed by your doctor. Safety measures are also important to protect the knee from further injury. Any time any of these exercises cause you to have increased pain or swelling in your knee joint, decrease the amount until you are comfortable again and slowly increase them. If you have problems or questions, call your caregiver or physical therapist for advice.  ° °HOME CARE INSTRUCTIONS  °Remove items at home which could result in a fall. This includes throw rugs or furniture in walking pathways.  °Continue medications as instructed at time of discharge. °You may have some home medications which will be placed on hold until you complete the course of blood thinner medication.  °You may start showering once you are discharged home but do not submerge the incision under water. Just pat the incision dry and apply a dry gauze dressing on daily. °Walk with walker as instructed.  °You may resume a sexual relationship in one month or when given the OK by  your doctor.  °· Use walker as long as suggested by your caregivers. °· Avoid periods of inactivity such as sitting longer than an hour when not asleep. This helps prevent blood clots.  °You may put full weight on your legs and walk as much as is comfortable.  °You may return to work once you are cleared by your doctor.  °Do not drive a car for 6 weeks or until released by you surgeon.  °· Do not drive while taking narcotics.  °Wear the elastic stockings for three weeks following surgery during the day but you may remove then at night. °Make sure you keep all of your appointments after your  operation with all of your doctors and caregivers. You should call the office at the above phone number and make an appointment for approximately two weeks after the date of your surgery. °Change the dressing daily and reapply a dry dressing each time. °Please pick up a stool softener and laxative for home use as long as you are requiring pain medications. °· Continue to use ice on the knee for pain and swelling from surgery. You may notice swelling that will progress down to the foot and ankle.  This is normal after surgery.  Elevate the leg when you are not up walking on it.   °It is important for you to complete the blood thinner medication as prescribed by your doctor. °· Continue to use the breathing machine which will help keep your temperature down.  It is common for your temperature to cycle up and down following surgery, especially at night when you are not up moving around and exerting yourself.  The breathing machine keeps your lungs expanded and your temperature down. ° °RANGE OF MOTION AND STRENGTHENING EXERCISES  °Rehabilitation of the knee is important following a knee injury or an operation. After just a few days of immobilization, the muscles of the thigh which control the knee become weakened and shrink (atrophy). Knee exercises are designed to build up the tone and strength of the thigh muscles and to improve knee motion. Often times heat used for twenty to thirty minutes before working out will loosen up your tissues and help with improving the   range of motion but do not use heat for the first two weeks following surgery. These exercises can be done on a training (exercise) mat, on the floor, on a table or on a bed. Use what ever works the best and is most comfortable for you Knee exercises include:  Leg Lifts - While your knee is still immobilized in a splint or cast, you can do straight leg raises. Lift the leg to 60 degrees, hold for 3 sec, and slowly lower the leg. Repeat 10-20 times 2-3  times daily. Perform this exercise against resistance later as your knee gets better.  Quad and Hamstring Sets - Tighten up the muscle on the front of the thigh (Quad) and hold for 5-10 sec. Repeat this 10-20 times hourly. Hamstring sets are done by pushing the foot backward against an object and holding for 5-10 sec. Repeat as with quad sets.  A rehabilitation program following serious knee injuries can speed recovery and prevent re-injury in the future due to weakened muscles. Contact your doctor or a physical therapist for more information on knee rehabilitation.   SKILLED REHAB INSTRUCTIONS: If the patient is transferred to a skilled rehab facility following release from the hospital, a list of the current medications will be sent to the facility for the patient to continue.  When discharged from the skilled rehab facility, please have the facility set up the patient's [place] prior to being released. Also, the skilled facility will be responsible for providing the patient with their medications at time of release from the facility to include their pain medication, the muscle relaxants, and their blood thinner medication. If the patient is still at the rehab facility at time of the two week follow up appointment, the skilled rehab facility will also need to assist the patient in arranging follow up appointment in our office and any transportation needs.  MAKE SURE YOU:  Understand these instructions.  Will watch your condition.  Will get help right away if you are not doing well or get worse.    Pick up stool softner and laxative for home. Do not submerge incision under water. May shower. Continue to use ice for pain and swelling from surgery.  Take Xarelto for two and a half more weeks, then discontinue Xarelto. Once the patient has completed the Xarelto, they may resume the 81 mg Aspirin.  When discharged from the skilled rehab facility, please have the facility set up  the patient's [place] prior to being released.  Also provide the patient with their medications at time of release from the facility to include their pain medication, the muscle relaxants, and their blood thinner medication.  If the patient is still at the rehab facility at time of follow up appointment, please also assist the patient in arranging follow up appointment in our office and any transportation needs.   Information on my medicine - XARELTO (Rivaroxaban)  This medication education was reviewed with me or my healthcare representative as part of my discharge preparation.  The pharmacist that spoke with me during my hospital stay was:  Absher, Julieta Bellini, RPH  Why was Xarelto prescribed for you? Xarelto was prescribed for you to reduce the risk of blood clots forming after orthopedic surgery.   What do you need to know about xarelto ? Take your Xarelto ONCE DAILY at the same time every day. If you have difficulty swallowing the tablet whole, you may crush it and mix in applesauce just prior  to taking your dose.  Take Xarelto exactly as prescribed by your doctor and DO NOT stop taking Xarelto without talking to the doctor who prescribed the medication.  Stopping without other VTE prevention medication to take the place of Xarelto may increase your risk of developing a new clot.  After discharge, you should have regular check-up appointments with your healthcare provider that is prescribing your Xarelto.  In the future your dose may need to be changed if your kidney function or weight changes by a significant amount.  What do you do if you miss a dose? If you are taking Xarelto ONCE DAILY and you miss a dose, take it as soon as you remember on the same day then continue your regularly scheduled once daily regimen the next day. Do not take two doses of Xarelto at the same time.   Important Safety Information A possible side effect of Xarelto is bleeding. You  should call your healthcare provider right away if you experience any of the following:   Bleeding from an injury or your nose that does not stop.   Unusual colored urine (red or dark brown) or unusual colored stools (red or black).   Unusual bruising for unknown reasons.   A serious fall or if you hit your head (even if there is no bleeding).  Some medicines may interact with Xarelto and might increase your risk of bleeding while on Xarelto. To help avoid this, consult your healthcare provider or pharmacist prior to using any new prescription or non-prescription medications, including herbals, vitamins, non-steroidal anti-inflammatory drugs (NSAIDs) and supplements.  This website has more information on Xarelto: [email or web address]

## 2013-10-12 DIAGNOSIS — M25569 Pain in unspecified knee: Secondary | ICD-10-CM | POA: Diagnosis not present

## 2013-10-12 DIAGNOSIS — M4120 Other idiopathic scoliosis, site unspecified: Secondary | ICD-10-CM | POA: Diagnosis not present

## 2013-10-12 DIAGNOSIS — IMO0002 Reserved for concepts with insufficient information to code with codable children: Secondary | ICD-10-CM | POA: Diagnosis not present

## 2013-10-12 DIAGNOSIS — R279 Unspecified lack of coordination: Secondary | ICD-10-CM | POA: Diagnosis not present

## 2013-10-12 DIAGNOSIS — M1990 Unspecified osteoarthritis, unspecified site: Secondary | ICD-10-CM | POA: Diagnosis not present

## 2013-10-12 DIAGNOSIS — K5900 Constipation, unspecified: Secondary | ICD-10-CM | POA: Diagnosis not present

## 2013-10-12 DIAGNOSIS — E785 Hyperlipidemia, unspecified: Secondary | ICD-10-CM | POA: Diagnosis not present

## 2013-10-12 DIAGNOSIS — F329 Major depressive disorder, single episode, unspecified: Secondary | ICD-10-CM | POA: Diagnosis not present

## 2013-10-12 DIAGNOSIS — G4730 Sleep apnea, unspecified: Secondary | ICD-10-CM | POA: Diagnosis not present

## 2013-10-12 DIAGNOSIS — M1710 Unilateral primary osteoarthritis, unspecified knee: Secondary | ICD-10-CM | POA: Diagnosis not present

## 2013-10-12 DIAGNOSIS — E669 Obesity, unspecified: Secondary | ICD-10-CM | POA: Diagnosis not present

## 2013-10-12 DIAGNOSIS — F3289 Other specified depressive episodes: Secondary | ICD-10-CM | POA: Diagnosis not present

## 2013-10-12 DIAGNOSIS — Z6841 Body Mass Index (BMI) 40.0 and over, adult: Secondary | ICD-10-CM | POA: Diagnosis not present

## 2013-10-12 DIAGNOSIS — R269 Unspecified abnormalities of gait and mobility: Secondary | ICD-10-CM | POA: Diagnosis not present

## 2013-10-12 DIAGNOSIS — Z471 Aftercare following joint replacement surgery: Secondary | ICD-10-CM | POA: Diagnosis not present

## 2013-10-12 DIAGNOSIS — M6281 Muscle weakness (generalized): Secondary | ICD-10-CM | POA: Diagnosis not present

## 2013-10-12 DIAGNOSIS — G4733 Obstructive sleep apnea (adult) (pediatric): Secondary | ICD-10-CM | POA: Diagnosis not present

## 2013-10-12 DIAGNOSIS — I10 Essential (primary) hypertension: Secondary | ICD-10-CM | POA: Diagnosis not present

## 2013-10-12 DIAGNOSIS — Z96659 Presence of unspecified artificial knee joint: Secondary | ICD-10-CM | POA: Diagnosis not present

## 2013-10-12 LAB — CBC
HCT: 34.3 % — ABNORMAL LOW (ref 36.0–46.0)
Hemoglobin: 11.1 g/dL — ABNORMAL LOW (ref 12.0–15.0)
MCH: 30.1 pg (ref 26.0–34.0)
MCHC: 32.4 g/dL (ref 30.0–36.0)
MCV: 93 fL (ref 78.0–100.0)
PLATELETS: 182 10*3/uL (ref 150–400)
RBC: 3.69 MIL/uL — ABNORMAL LOW (ref 3.87–5.11)
RDW: 13.1 % (ref 11.5–15.5)
WBC: 12.1 10*3/uL — ABNORMAL HIGH (ref 4.0–10.5)

## 2013-10-12 MED ORDER — TRAMADOL HCL 50 MG PO TABS: 50.0000 mg | ORAL_TABLET | Freq: Four times a day (QID) | ORAL | Status: DC | PRN

## 2013-10-12 MED ORDER — OXYCODONE HCL 5 MG PO TABS: 5.0000 mg | ORAL_TABLET | ORAL | Status: DC | PRN

## 2013-10-12 MED ORDER — POLYETHYLENE GLYCOL 3350 17 G PO PACK: 17.0000 g | PACK | Freq: Every day | ORAL | Status: DC | PRN

## 2013-10-12 MED ORDER — METOCLOPRAMIDE HCL 5 MG PO TABS: 5.0000 mg | ORAL_TABLET | Freq: Three times a day (TID) | ORAL | Status: DC | PRN

## 2013-10-12 MED ORDER — BISACODYL 10 MG RE SUPP: 10.0000 mg | Freq: Every day | RECTAL | Status: DC | PRN

## 2013-10-12 MED ORDER — DSS 100 MG PO CAPS: 100.0000 mg | ORAL_CAPSULE | Freq: Two times a day (BID) | ORAL | Status: DC

## 2013-10-12 MED ORDER — METHOCARBAMOL 500 MG PO TABS: 500.0000 mg | ORAL_TABLET | Freq: Four times a day (QID) | ORAL | Status: DC | PRN

## 2013-10-12 MED ORDER — ONDANSETRON HCL 4 MG PO TABS: 4.0000 mg | ORAL_TABLET | Freq: Four times a day (QID) | ORAL | Status: DC | PRN

## 2013-10-12 MED ORDER — CYCLOBENZAPRINE HCL 10 MG PO TABS: 10.0000 mg | ORAL_TABLET | Freq: Three times a day (TID) | ORAL | Status: DC | PRN

## 2013-10-12 MED ORDER — RIVAROXABAN 10 MG PO TABS: 10.0000 mg | ORAL_TABLET | Freq: Every day | ORAL | Status: DC

## 2013-10-12 NOTE — Progress Notes (Signed)
Clinical Social Work Department CLINICAL SOCIAL WORK PLACEMENT NOTE 10/12/2013  Patient:  Melinda Kerr, Melinda Kerr  Account Number:  [id number] Admit date:  10/09/2013  Clinical Social Worker:  Werner Lean, LCSW  Date/time:  10/10/2013 04:01 PM  Clinical Social Work is seeking post-discharge placement for this patient at the following level of care:   SKILLED NURSING   (*CSW will update this form in Epic as items are completed)     Patient/family provided with [facility] Department of Clinical Social Work's list of facilities offering this level of care within the geographic area requested by the patient (or if unable, by the patient's family).  10/10/2013  Patient/family informed of their freedom to choose among providers that offer the needed level of care, that participate in Medicare, Medicaid or managed care program needed by the patient, have an available bed and are willing to accept the patient.    Patient/family informed of [facility]' ownership interest in [facility], as well as of the fact that they are under no obligation to receive care at this facility.  PASARR submitted to EDS on 10/10/2013 PASARR number received from EDS on 10/10/2013  FL2 transmitted to all facilities in geographic area requested by pt/family on  10/10/2013 FL2 transmitted to all facilities within larger geographic area on   Patient informed that his/her managed care company has contracts with or will negotiate with  certain facilities, including the following:     Patient/family informed of bed offers received:  10/10/2013 Patient chooses bed at [facility] Physician recommends and patient chooses bed at    Patient to be transferred to [facility] on  10/12/2013 Patient to be transferred to facility by [facility]  The following physician request were entered in Epic:   Additional Comments:    Werner Lean LCSW [phone]

## 2013-10-12 NOTE — Discharge Summary (Signed)
Physician Discharge Summary   Patient ID: Melinda Kerr MRN: [id number] DOB/AGE: October 11, 1934 79 y.o.  Admit date: 10/09/2013 Discharge date:  10-12-2013  Primary Diagnosis:  Osteoarthritis Left knee(s)  Admission Diagnoses:  Past Medical History  Diagnosis Date  . Hyperlipidemia   . Depression   . OSA on CPAP   . Spondylosis     with scoliosis  . Phlebitis   . Esophageal reflux   . Morbid obesity with BMI of 40.0-44.9, adult   . Hypertension    Discharge Diagnoses:   Principal Problem:   OA (osteoarthritis) of knee  Estimated body mass index is 44.53 kg/(m^2) as calculated from the following:   Height as of this encounter: 5' (1.524 m).   Weight as of this encounter: 103.42 kg (228 lb).  Procedure:  Procedure(s) (LRB): LEFT TOTAL KNEE ARTHROPLASTY (Left)   Consults: None  HPI: Melinda Kerr is a 79 y.o. year old female with end stage OA of her left knee with progressively worsening pain and dysfunction. She has constant pain, with activity and at rest and significant functional deficits with difficulties even with ADLs. She has had extensive non-op management including analgesics, injections of cortisone and viscosupplements, and home exercise program, but remains in significant pain with significant dysfunction. Radiographs show bone on bone arthritis medial and patellofemoral. She presents now for left Total Knee Arthroplasty.   Laboratory Data: Admission on 10/09/2013  Component Date Value Range Status  . WBC 10/10/2013 13.5* 4.0 - 10.5 K/uL Final   WHITE COUNT CONFIRMED ON SMEAR  . RBC 10/10/2013 3.74* 3.87 - 5.11 MIL/uL Final  . Hemoglobin 10/10/2013 11.6* 12.0 - 15.0 g/dL Final  . HCT 10/10/2013 34.7* 36.0 - 46.0 % Final  . MCV 10/10/2013 92.8  78.0 - 100.0 fL Final  . MCH 10/10/2013 31.0  26.0 - 34.0 pg Final  . MCHC 10/10/2013 33.4  30.0 - 36.0 g/dL Final   PRE-WARMING TECHNIQUE USED  . RDW 10/10/2013 12.9  11.5 - 15.5 % Final  . Platelets 10/10/2013 206   150 - 400 K/uL Final  . Sodium 10/10/2013 140  137 - 147 mEq/L Final  . Potassium 10/10/2013 4.8  3.7 - 5.3 mEq/L Final  . Chloride 10/10/2013 105  96 - 112 mEq/L Final  . CO2 10/10/2013 26  19 - 32 mEq/L Final  . Glucose, Bld 10/10/2013 142* 70 - 99 mg/dL Final  . BUN 10/10/2013 18  6 - 23 mg/dL Final  . Creatinine, Ser 10/10/2013 0.81  0.50 - 1.10 mg/dL Final  . Calcium 10/10/2013 8.8  8.4 - 10.5 mg/dL Final  . GFR calc non Af Amer 10/10/2013 69* >90 mL/min Final  . GFR calc Af Amer 10/10/2013 80* >90 mL/min Final   Comment: (NOTE)                          The eGFR has been calculated using the CKD EPI equation.                          This calculation has not been validated in all clinical situations.                          eGFR's persistently <90 mL/min signify possible Chronic Kidney  Disease.  . WBC 10/11/2013 9.9  4.0 - 10.5 K/uL Final  . RBC 10/11/2013 3.79* 3.87 - 5.11 MIL/uL Final  . Hemoglobin 10/11/2013 11.9* 12.0 - 15.0 g/dL Final  . HCT 10/11/2013 35.6* 36.0 - 46.0 % Final  . MCV 10/11/2013 93.9  78.0 - 100.0 fL Final  . MCH 10/11/2013 31.4  26.0 - 34.0 pg Final  . MCHC 10/11/2013 33.4  30.0 - 36.0 g/dL Final  . RDW 10/11/2013 13.0  11.5 - 15.5 % Final  . Platelets 10/11/2013 202  150 - 400 K/uL Final  . Sodium 10/11/2013 134* 137 - 147 mEq/L Final  . Potassium 10/11/2013 4.9  3.7 - 5.3 mEq/L Final  . Chloride 10/11/2013 101  96 - 112 mEq/L Final  . CO2 10/11/2013 24  19 - 32 mEq/L Final  . Glucose, Bld 10/11/2013 154* 70 - 99 mg/dL Final  . BUN 10/11/2013 20  6 - 23 mg/dL Final  . Creatinine, Ser 10/11/2013 0.77  0.50 - 1.10 mg/dL Final  . Calcium 10/11/2013 7.9* 8.4 - 10.5 mg/dL Final  . GFR calc non Af Amer 10/11/2013 80* >90 mL/min Final  . GFR calc Af Amer 10/11/2013 >90  >90 mL/min Final   Comment: (NOTE)                          The eGFR has been calculated using the CKD EPI equation.                          This calculation has  not been validated in all clinical situations.                          eGFR's persistently <90 mL/min signify possible Chronic Kidney                          Disease.  . WBC 10/12/2013 12.1* 4.0 - 10.5 K/uL Final  . RBC 10/12/2013 3.69* 3.87 - 5.11 MIL/uL Final  . Hemoglobin 10/12/2013 11.1* 12.0 - 15.0 g/dL Final  . HCT 10/12/2013 34.3* 36.0 - 46.0 % Final  . MCV 10/12/2013 93.0  78.0 - 100.0 fL Final  . MCH 10/12/2013 30.1  26.0 - 34.0 pg Final  . MCHC 10/12/2013 32.4  30.0 - 36.0 g/dL Final  . RDW 10/12/2013 13.1  11.5 - 15.5 % Final  . Platelets 10/12/2013 182  150 - 400 K/uL Final  Hospital Outpatient Visit on 10/02/2013  Component Date Value Range Status  . Color, Urine 10/02/2013 YELLOW  YELLOW Final  . APPearance 10/02/2013 CLEAR  CLEAR Final  . Specific Gravity, Urine 10/02/2013 1.010  1.005 - 1.030 Final  . pH 10/02/2013 5.5  5.0 - 8.0 Final  . Glucose, UA 10/02/2013 NEGATIVE  NEGATIVE mg/dL Final  . Hgb urine dipstick 10/02/2013 NEGATIVE  NEGATIVE Final  . Bilirubin Urine 10/02/2013 NEGATIVE  NEGATIVE Final  . Ketones, ur 10/02/2013 NEGATIVE  NEGATIVE mg/dL Final  . Protein, ur 10/02/2013 NEGATIVE  NEGATIVE mg/dL Final  . Urobilinogen, UA 10/02/2013 0.2  0.0 - 1.0 mg/dL Final  . Nitrite 10/02/2013 NEGATIVE  NEGATIVE Final  . Leukocytes, UA 10/02/2013 SMALL* NEGATIVE Final  . MRSA, PCR 10/02/2013 POSITIVE* NEGATIVE Final   Comment: RESULT CALLED TO, READ BACK BY AND VERIFIED WITH:  AFTER HOURS [id number] 10/02/13 A NAVARRO  . Staphylococcus aureus 10/02/2013 POSITIVE* NEGATIVE Final   Comment:                                 The Xpert SA Assay (FDA                          approved for NASAL specimens                          in patients over 44 years of age),                          is one component of                          a comprehensive surveillance                          program.  Test performance has                          been  validated by [employer] for patients greater                          than or equal to 76 year old.                          It is not intended                          to diagnose infection nor to                          guide or monitor treatment.  Marland Kitchen aPTT 10/02/2013 30  24 - 37 seconds Final  . WBC 10/02/2013 7.2  4.0 - 10.5 K/uL Final  . RBC 10/02/2013 4.41  3.87 - 5.11 MIL/uL Final  . Hemoglobin 10/02/2013 13.5  12.0 - 15.0 g/dL Final  . HCT 10/02/2013 41.0  36.0 - 46.0 % Final  . MCV 10/02/2013 93.0  78.0 - 100.0 fL Final  . MCH 10/02/2013 30.6  26.0 - 34.0 pg Final  . MCHC 10/02/2013 32.9  30.0 - 36.0 g/dL Final  . RDW 10/02/2013 13.1  11.5 - 15.5 % Final  . Platelets 10/02/2013 245  150 - 400 K/uL Final  . Sodium 10/02/2013 136* 137 - 147 mEq/L Final  . Potassium 10/02/2013 4.4  3.7 - 5.3 mEq/L Final  . Chloride 10/02/2013 100  96 - 112 mEq/L Final  . CO2 10/02/2013 27  19 - 32 mEq/L Final  . Glucose, Bld 10/02/2013 89  70 - 99 mg/dL Final  . BUN 10/02/2013 17  6 - 23 mg/dL Final  . Creatinine, Ser 10/02/2013 0.84  0.50 - 1.10 mg/dL Final  . Calcium 10/02/2013 9.8  8.4 - 10.5 mg/dL Final  . Total Protein 10/02/2013 6.4  6.0 - 8.3 g/dL Final  . Albumin 10/02/2013 3.7  3.5 -  5.2 g/dL Final  . AST 10/02/2013 18  0 - 37 U/L Final  . ALT 10/02/2013 13  0 - 35 U/L Final  . Alkaline Phosphatase 10/02/2013 58  39 - 117 U/L Final  . Total Bilirubin 10/02/2013 0.5  0.3 - 1.2 mg/dL Final  . GFR calc non Af Amer 10/02/2013 66* >90 mL/min Final  . GFR calc Af Amer 10/02/2013 76* >90 mL/min Final   Comment: (NOTE)                          The eGFR has been calculated using the CKD EPI equation.                          This calculation has not been validated in all clinical situations.                          eGFR's persistently <90 mL/min signify possible Chronic Kidney                          Disease.  Marland Kitchen Prothrombin Time 10/02/2013 13.6  11.6 - 15.2  seconds Final  . INR 10/02/2013 1.06  0.00 - 1.49 Final  . ABO/RH(D) 10/02/2013 A NEG   Final  . Antibody Screen 10/02/2013 NEG   Final  . Sample Expiration 10/02/2013 10/12/2013   Final  . ABO/RH(D) 10/02/2013 A NEG   Final  . Squamous Epithelial / LPF 10/02/2013 RARE  RARE Final  . WBC, UA 10/02/2013 0-2  <3 WBC/hpf Final  . RBC / HPF 10/02/2013 0-2  <3 RBC/hpf Final  . Bacteria, UA 10/02/2013 RARE  RARE Final  . Casts 10/02/2013 HYALINE CASTS* NEGATIVE Final     X-Rays:Dg Chest 2 View  10/02/2013   CLINICAL DATA:  Preoperative evaluation for knee replacement  EXAM: CHEST  2 VIEW  COMPARISON:  None.  FINDINGS: The cardiac shadow is within normal limits. A large hiatal hernia is noted. The lungs are clear bilaterally. No acute bony abnormality is seen.  IMPRESSION: Hiatal hernia.  No acute abnormality is noted.   Electronically Signed   By: Inez Catalina M.D.   On: 10/02/2013 14:53    EKG: Orders placed during the hospital encounter of 10/02/13  . EKG 12-LEAD  . EKG 12-LEAD     Hospital Course: THURSA EMME is a 79 y.o. who was admitted to [facility]. They were brought to the operating room on 10/09/2013 and underwent Procedure(s): LEFT TOTAL KNEE ARTHROPLASTY.  Patient tolerated the procedure well and was later transferred to the recovery room and then to the orthopaedic floor for postoperative care.  They were given PO and IV analgesics for pain control following their surgery.  They were given 24 hours of postoperative antibiotics of  Anti-infectives   Start     Dose/Rate Route Frequency Ordered Stop   10/09/13 1400  ceFAZolin (ANCEF) IVPB 2 g/50 mL premix     2 g 100 mL/hr over 30 Minutes Intravenous Every 6 hours 10/09/13 1213 10/09/13 2047   10/09/13 0630  ceFAZolin (ANCEF) IVPB 2 g/50 mL premix    Comments:  Dose decreased to 2g per P&T policy for weight < 973ZH.   2 g 100 mL/hr over 30 Minutes Intravenous On call to O.R. 10/09/13 2992 10/09/13 0837     and  started on DVT prophylaxis in  the form of Xarelto.   PT and OT were ordered for total joint protocol.  Discharge planning consulted to help with postop disposition and equipment needs.  Patient had a tough night on the evening of surgery with not much rest.  They started to get up OOB with therapy on day of surgery and walked 40 feet. Worked with therapy again on day one.  Removac drain was pulled without difficulty.  Continued to work with therapy into day two.  Dressing was changed on day two and the incision was healing well.  By day three, the patient had progressed with therapy and meeting their goals.  Incision was healing well.  Patient was seen in rounds by Dr. Wynelle Link and was ready to go to [facility].   Discharge Medications: Prior to Admission medications   Medication Sig Start Date End Date Taking? Authorizing Provider  acetaminophen (TYLENOL) 500 MG tablet Take 1,000 mg by mouth daily.   Yes Historical Provider, MD  alendronate (FOSAMAX) 70 MG tablet Take 70 mg by mouth once a week. Take with a full glass of water on an empty stomach.   Yes Historical Provider, MD  allopurinol (ZYLOPRIM) 300 MG tablet Take 300 mg by mouth daily with breakfast.    Yes Historical Provider, MD  aspirin 81 MG tablet Take 81 mg by mouth daily.   Yes Historical Provider, MD  Calcium 600-200 MG-UNIT per tablet Take 1 tablet by mouth 2 (two) times daily.   Yes Historical Provider, MD  cetirizine (ZYRTEC) 10 MG tablet Take 10 mg by mouth daily as needed for allergies.   Yes Historical Provider, MD  citalopram (CELEXA) 20 MG tablet Take 10 mg by mouth at bedtime.    Yes Historical Provider, MD  ferrous sulfate dried (SLOW FE) 160 (50 FE) MG TBCR SR tablet Take 160 mg by mouth daily.   Yes Historical Provider, MD  furosemide (LASIX) 40 MG tablet Take 20 mg by mouth.   Yes Historical Provider, MD  Corinda Gubler 565 MG CAPS Take 1 capsule by mouth 2 (two) times daily.    Yes Historical Provider, MD  hydrOXYzine  (VISTARIL) 25 MG capsule Take 25 mg by mouth at bedtime.    Yes Historical Provider, MD  levothyroxine (SYNTHROID, LEVOTHROID) 125 MCG tablet Take 125 mcg by mouth daily before breakfast.   Yes Historical Provider, MD  lisinopril (PRINIVIL,ZESTRIL) 20 MG tablet Take 20 mg by mouth daily with breakfast.    Yes Historical Provider, MD  Multiple Vitamin (M.V.I. ADULT IV) Take 1 tablet by mouth daily.    Yes Historical Provider, MD  omeprazole (PRILOSEC OTC) 20 MG tablet Take 20 mg by mouth daily.   Yes Historical Provider, MD  pyridOXINE (VITAMIN B-6) 100 MG tablet Take 100 mg by mouth daily.   Yes Historical Provider, MD  ranitidine (ZANTAC) 300 MG tablet Take 300 mg by mouth at bedtime.   Yes Historical Provider, MD  simvastatin (ZOCOR) 20 MG tablet Take 20 mg by mouth daily.   Yes Historical Provider, MD  tolterodine (DETROL LA) 4 MG 24 hr capsule Take 4 mg by mouth daily.   Yes Historical Provider, MD  vitamin C (ASCORBIC ACID) 500 MG tablet Take 500 mg by mouth daily.   Yes Historical Provider, MD  vitamin E 400 UNIT capsule Take 400 Units by mouth daily.   Yes Historical Provider, MD  bisacodyl (DULCOLAX) 10 MG suppository Place 1 suppository (10 mg total) rectally daily as needed for moderate constipation. 10/12/13  Willma Obando Dara Lords, PA-C  cyclobenzaprine (FLEXERIL) 10 MG tablet Take 1 tablet (10 mg total) by mouth 3 (three) times daily as needed for muscle spasms. 10/12/13   Antwan Bribiesca, PA-C  docusate sodium 100 MG CAPS Take 100 mg by mouth 2 (two) times daily. 10/12/13   Johnanthony Wilden, PA-C  Loratadine (CLARITIN) 10 MG CAPS Take 1 capsule by mouth as needed (allergy).     Historical Provider, MD  Melatonin 3 MG TABS Take 9 mg by mouth at bedtime.    Historical Provider, MD  methocarbamol (ROBAXIN) 500 MG tablet Take 1 tablet (500 mg total) by mouth every 6 (six) hours as needed for muscle spasms. 10/12/13   Sheylin Scharnhorst Dara Lords, PA-C  metoCLOPramide (REGLAN) 5 MG tablet Take 1-2  tablets (5-10 mg total) by mouth every 8 (eight) hours as needed for nausea (if ondansetron (ZOFRAN) ineffective.). 10/12/13   Fayth Trefry, PA-C  ondansetron (ZOFRAN) 4 MG tablet Take 1 tablet (4 mg total) by mouth every 6 (six) hours as needed for nausea. 10/12/13   Zaire Levesque, PA-C  oxyCODONE (OXY IR/ROXICODONE) 5 MG immediate release tablet Take 1-2 tablets (5-10 mg total) by mouth every 3 (three) hours as needed for breakthrough pain. 10/12/13   Lavere Shinsky, PA-C  polyethylene glycol (MIRALAX / GLYCOLAX) packet Take 17 g by mouth daily as needed for mild constipation. 10/12/13   Mahala Rommel Dara Lords, PA-C  rivaroxaban (XARELTO) 10 MG TABS tablet Take 1 tablet (10 mg total) by mouth daily with breakfast. Take Xarelto for two and a half more weeks, then discontinue Xarelto. Once the patient has completed the blood thinner regimen, then take a Baby 81 mg Aspirin daily for four more weeks. 10/12/13   Elijiah Mickley, PA-C  traMADol (ULTRAM) 50 MG tablet Take 1 tablet (50 mg total) by mouth every 6 (six) hours as needed (mild to moderate pain). 10/12/13   Herby Amick Dara Lords, PA-C   Discharge home with home health  Diet - Cardiac diet  Follow up - in 2 weeks  Activity - WBAT  Disposition - Skilled nursing facility - [facility]  Condition Upon Discharge - Good  D/C Meds - See DC Summary  DVT Prophylaxis - Xarelto   Discharge Orders   Future Orders Complete By Expires   Call MD / Call 911  As directed    Comments:     If you experience chest pain or shortness of breath, CALL 911 and be transported to the hospital emergency room.  If you develope a fever above 101 F, pus (white drainage) or increased drainage or redness at the wound, or calf pain, call your surgeon's office.   Change dressing  As directed    Comments:     Change dressing daily with sterile 4 x 4 inch gauze dressing and apply TED hose. Do not submerge the incision under water.   Constipation Prevention   As directed    Comments:     Drink plenty of fluids.  Prune juice may be helpful.  You may use a stool softener, such as Colace (over the counter) 100 mg twice a day.  Use MiraLax (over the counter) for constipation as needed.   Diet - low sodium heart healthy  As directed    Discharge instructions  As directed    Comments:     Pick up stool softner and laxative for home. Do not submerge incision under water. May shower. Continue to use ice for pain and swelling from surgery.  Take Xarelto for two and  a half more weeks, then discontinue Xarelto. Once the patient has completed the Xarelto, they may resume the 81 mg Aspirin.   Do not put a pillow under the knee. Place it under the heel.  As directed    Do not sit on low chairs, stoools or toilet seats, as it may be difficult to get up from low surfaces  As directed    Driving restrictions  As directed    Comments:     No driving until released by the physician.   Increase activity slowly as tolerated  As directed    Lifting restrictions  As directed    Comments:     No lifting until released by the physician.   Patient may shower  As directed    Comments:     You may shower without a dressing once there is no drainage.  Do not wash over the wound.  If drainage remains, do not shower until drainage stops.   TED hose  As directed    Comments:     Use stockings (TED hose) for 3 weeks on both leg(s).  You may remove them at night for sleeping.   Weight bearing as tolerated  As directed    Questions:     Laterality:     Extremity:         Medication List    STOP taking these medications       alendronate 70 MG tablet  Commonly known as:  FOSAMAX     aspirin 81 MG tablet     Calcium 600-200 MG-UNIT per tablet     Hawthorn Berry 565 MG Caps     M.V.I. ADULT IV     Melatonin 3 MG Tabs     pyridOXINE 100 MG tablet  Commonly known as:  VITAMIN B-6     vitamin C 500 MG tablet  Commonly known as:  ASCORBIC ACID     vitamin  E 400 UNIT capsule      TAKE these medications       acetaminophen 500 MG tablet  Commonly known as:  TYLENOL  Take 1,000 mg by mouth daily.     allopurinol 300 MG tablet  Commonly known as:  ZYLOPRIM  Take 300 mg by mouth daily with breakfast.     bisacodyl 10 MG suppository  Commonly known as:  DULCOLAX  Place 1 suppository (10 mg total) rectally daily as needed for moderate constipation.     cetirizine 10 MG tablet  Commonly known as:  ZYRTEC  Take 10 mg by mouth daily as needed for allergies.     citalopram 20 MG tablet  Commonly known as:  CELEXA  Take 10 mg by mouth at bedtime.     CLARITIN 10 MG Caps  Generic drug:  Loratadine  Take 1 capsule by mouth as needed (allergy).     cyclobenzaprine 10 MG tablet  Commonly known as:  FLEXERIL  Take 1 tablet (10 mg total) by mouth 3 (three) times daily as needed for muscle spasms.     DSS 100 MG Caps  Take 100 mg by mouth 2 (two) times daily.     ferrous sulfate dried 160 (50 FE) MG Tbcr SR tablet  Commonly known as:  SLOW FE  Take 160 mg by mouth daily.     furosemide 40 MG tablet  Commonly known as:  LASIX  Take 20 mg by mouth.     hydrOXYzine 25 MG capsule  Commonly known as:  VISTARIL  Take 25 mg by mouth at bedtime.     levothyroxine 125 MCG tablet  Commonly known as:  SYNTHROID, LEVOTHROID  Take 125 mcg by mouth daily before breakfast.     lisinopril 20 MG tablet  Commonly known as:  PRINIVIL,ZESTRIL  Take 20 mg by mouth daily with breakfast.     methocarbamol 500 MG tablet  Commonly known as:  ROBAXIN  Take 1 tablet (500 mg total) by mouth every 6 (six) hours as needed for muscle spasms.     metoCLOPramide 5 MG tablet  Commonly known as:  REGLAN  Take 1-2 tablets (5-10 mg total) by mouth every 8 (eight) hours as needed for nausea (if ondansetron (ZOFRAN) ineffective.).     omeprazole 20 MG tablet  Commonly known as:  PRILOSEC OTC  Take 20 mg by mouth daily.     ondansetron 4 MG tablet  Commonly  known as:  ZOFRAN  Take 1 tablet (4 mg total) by mouth every 6 (six) hours as needed for nausea.     oxyCODONE 5 MG immediate release tablet  Commonly known as:  Oxy IR/ROXICODONE  Take 1-2 tablets (5-10 mg total) by mouth every 3 (three) hours as needed for breakthrough pain.     polyethylene glycol packet  Commonly known as:  MIRALAX / GLYCOLAX  Take 17 g by mouth daily as needed for mild constipation.     ranitidine 300 MG tablet  Commonly known as:  ZANTAC  Take 300 mg by mouth at bedtime.     rivaroxaban 10 MG Tabs tablet  Commonly known as:  XARELTO  - Take 1 tablet (10 mg total) by mouth daily with breakfast. Take Xarelto for two and a half more weeks, then discontinue Xarelto.  - Once the patient has completed the blood thinner regimen, then take a Baby 81 mg Aspirin daily for four more weeks.     simvastatin 20 MG tablet  Commonly known as:  ZOCOR  Take 20 mg by mouth daily.     tolterodine 4 MG 24 hr capsule  Commonly known as:  DETROL LA  Take 4 mg by mouth daily.     traMADol 50 MG tablet  Commonly known as:  ULTRAM  Take 1 tablet (50 mg total) by mouth every 6 (six) hours as needed (mild to moderate pain).           Follow-up Information   Follow up with [name], MD. Schedule an appointment as soon as possible for a visit in 2 weeks.   Specialty:  Orthopedic Surgery   Contact information:   [address] [place] [address] [phone]       Signed: Mickel Crow 10/12/2013, 9:07 AM

## 2013-10-12 NOTE — Progress Notes (Signed)
   Subjective: 3 Days Post-Op Procedure(s) (LRB): LEFT TOTAL KNEE ARTHROPLASTY (Left) Patient reports pain as mild.   Patient seen in rounds by Dr. Wynelle Link. Patient is well, and has had no acute complaints or problems Patient is ready to go to [facility].  Objective: Vital signs in last 24 hours: Temp:  [98.3 F (36.8 C)-98.8 F (37.1 C)] 98.6 F (37 C) (01/15 0640) Pulse Rate:  [93-98] 93 (01/15 0640) Resp:  [16] 16 (01/15 0640) BP: (126-138)/(68-83) 138/83 mmHg (01/15 0640) SpO2:  [96 %-98 %] 96 % (01/15 0640)  Intake/Output from previous day:  Intake/Output Summary (Last 24 hours) at 10/12/13 0859 Last data filed at 10/12/13 0641  Gross per 24 hour  Intake   1080 ml  Output   2200 ml  Net  -1120 ml    Intake/Output this shift:    Labs:  Recent Labs  10/10/13 0531 10/11/13 0519 10/12/13 0555  HGB 11.6* 11.9* 11.1*    Recent Labs  10/11/13 0519 10/12/13 0555  WBC 9.9 12.1*  RBC 3.79* 3.69*  HCT 35.6* 34.3*  PLT 202 182    Recent Labs  10/10/13 0531 10/11/13 0519  NA 140 134*  K 4.8 4.9  CL 105 101  CO2 26 24  BUN 18 20  CREATININE 0.81 0.77  GLUCOSE 142* 154*  CALCIUM 8.8 7.9*   No results found for this basename: LABPT, INR,  in the last 72 hours  EXAM: General - Patient is Alert, Appropriate and Oriented Extremity - Neurovascular intact Sensation intact distally Dorsiflexion/Plantar flexion intact Incision - clean, dry, no drainage, healing Motor Function - intact, moving foot and toes well on exam.   Assessment/Plan: 3 Days Post-Op Procedure(s) (LRB): LEFT TOTAL KNEE ARTHROPLASTY (Left) Procedure(s) (LRB): LEFT TOTAL KNEE ARTHROPLASTY (Left) Past Medical History  Diagnosis Date  . Hyperlipidemia   . Depression   . OSA on CPAP   . Spondylosis     with scoliosis  . Phlebitis   . Esophageal reflux   . Morbid obesity with BMI of 40.0-44.9, adult   . Hypertension    Principal Problem:   OA (osteoarthritis) of  knee  Estimated body mass index is 44.53 kg/(m^2) as calculated from the following:   Height as of this encounter: 5' (1.524 m).   Weight as of this encounter: 103.42 kg (228 lb). Up with therapy Discharge home with home health Diet - Cardiac diet Follow up - in 2 weeks Activity - WBAT Disposition - Skilled nursing facility - [facility] Condition Upon Discharge - Good D/C Meds - See DC Summary DVT Prophylaxis - Xarelto  Tenee Wish 10/12/2013, 8:59 AM

## 2013-10-12 NOTE — Progress Notes (Signed)
Physical Therapy Treatment Patient Details Name: JULIANNY MILSTEIN MRN: [id number] DOB: Sep 04, 1935 Today's Date: 10/12/2013 Time: 7948-0165 PT Time Calculation (min): 30 min  PT Assessment / Plan / Recommendation  History of Present Illness s/p L TKR   PT Comments   Pt ambulated in hallway and performed LE exercises.  Pt to d/c to SNF today.  Follow Up Recommendations  SNF     Does the patient have the potential to tolerate intense rehabilitation     Barriers to Discharge        Equipment Recommendations  None recommended by PT    Recommendations for Other Services    Frequency 7X/week   Progress towards PT Goals Progress towards PT goals: Progressing toward goals  Plan Current plan remains appropriate    Precautions / Restrictions Precautions Precautions: Knee Required Braces or Orthoses: Knee Immobilizer - Left Knee Immobilizer - Left: Discontinue once straight leg raise with < 10 degree lag Restrictions Other Position/Activity Restrictions: WBAT   Pertinent Vitals/Pain Minimal pain L knee, repositioned, premedicated    Mobility  Bed Mobility Overal bed mobility: Needs Assistance Bed Mobility: Supine to Sit Supine to sit: Supervision General bed mobility comments: verbal cues for technique, use of UEs on rails Transfers Overall transfer level: Needs assistance Equipment used: Rolling walker (2 wheeled) Transfers: Sit to/from Stand Sit to Stand: Min assist;From elevated surface Stand pivot transfers: Min assist General transfer comment: verbal cues for safe technique, required rocking motion to stand with assist also provided Ambulation/Gait Ambulation/Gait assistance: Min guard Ambulation Distance (Feet): 100 Feet Assistive device: Rolling walker (2 wheeled) Gait Pattern/deviations: Step-to pattern;Antalgic;Trunk flexed Gait velocity: decr General Gait Details: verbal cues for sequence , RW distance, step length, and posture    Exercises Total Joint  Exercises Ankle Circles/Pumps: AROM;15 reps;Both;Supine Quad Sets: AROM;Both;15 reps;Supine Towel Squeeze: AROM;Both;15 reps;Supine Short Arc Quad: AAROM;Left;15 reps;Supine Heel Slides: AAROM;Left;15 reps;Supine Hip ABduction/ADduction: 15 reps;Left;Supine;AROM   PT Diagnosis:    PT Problem List:   PT Treatment Interventions:     PT Goals (current goals can now be found in the care plan section)    Visit Information  Last PT Received On: 10/12/13 Assistance Needed: +1 History of Present Illness: s/p L TKR    Subjective Data      Cognition  Cognition Arousal/Alertness: Awake/alert Behavior During Therapy: WFL for tasks assessed/performed Overall Cognitive Status: Within Functional Limits for tasks assessed    Balance     End of Session PT - End of Session Equipment Utilized During Treatment: Left knee immobilizer Activity Tolerance: Patient tolerated treatment well Patient left: in chair;with call bell/phone within reach   GP     Lacinda Curvin,KATHrine E 10/12/2013, 11:46 AM Carmelia Bake, PT, DPT 10/12/2013 Pager: [phone]

## 2013-10-13 ENCOUNTER — Other Ambulatory Visit: Payer: Self-pay | Admitting: *Deleted

## 2013-10-13 ENCOUNTER — Non-Acute Institutional Stay (SKILLED_NURSING_FACILITY): Payer: Medicare Other | Admitting: Adult Health

## 2013-10-13 DIAGNOSIS — I10 Essential (primary) hypertension: Secondary | ICD-10-CM | POA: Diagnosis not present

## 2013-10-13 DIAGNOSIS — E785 Hyperlipidemia, unspecified: Secondary | ICD-10-CM

## 2013-10-13 DIAGNOSIS — M109 Gout, unspecified: Secondary | ICD-10-CM

## 2013-10-13 DIAGNOSIS — E039 Hypothyroidism, unspecified: Secondary | ICD-10-CM

## 2013-10-13 DIAGNOSIS — Z96659 Presence of unspecified artificial knee joint: Secondary | ICD-10-CM

## 2013-10-13 DIAGNOSIS — R32 Unspecified urinary incontinence: Secondary | ICD-10-CM

## 2013-10-13 DIAGNOSIS — G4700 Insomnia, unspecified: Secondary | ICD-10-CM

## 2013-10-13 DIAGNOSIS — G4733 Obstructive sleep apnea (adult) (pediatric): Secondary | ICD-10-CM

## 2013-10-13 DIAGNOSIS — M1710 Unilateral primary osteoarthritis, unspecified knee: Secondary | ICD-10-CM | POA: Diagnosis not present

## 2013-10-13 DIAGNOSIS — IMO0002 Reserved for concepts with insufficient information to code with codable children: Secondary | ICD-10-CM

## 2013-10-13 DIAGNOSIS — F329 Major depressive disorder, single episode, unspecified: Secondary | ICD-10-CM

## 2013-10-13 DIAGNOSIS — Z6841 Body Mass Index (BMI) 40.0 and over, adult: Secondary | ICD-10-CM

## 2013-10-13 DIAGNOSIS — K5900 Constipation, unspecified: Secondary | ICD-10-CM

## 2013-10-13 DIAGNOSIS — Z9989 Dependence on other enabling machines and devices: Secondary | ICD-10-CM

## 2013-10-13 DIAGNOSIS — F32A Depression, unspecified: Secondary | ICD-10-CM

## 2013-10-13 DIAGNOSIS — J309 Allergic rhinitis, unspecified: Secondary | ICD-10-CM

## 2013-10-13 DIAGNOSIS — M179 Osteoarthritis of knee, unspecified: Secondary | ICD-10-CM

## 2013-10-13 DIAGNOSIS — F3289 Other specified depressive episodes: Secondary | ICD-10-CM

## 2013-10-13 DIAGNOSIS — Z96652 Presence of left artificial knee joint: Secondary | ICD-10-CM

## 2013-10-13 DIAGNOSIS — D649 Anemia, unspecified: Secondary | ICD-10-CM

## 2013-10-13 MED ORDER — OXYCODONE HCL 5 MG PO TABS: 5.0000 mg | ORAL_TABLET | ORAL | Status: DC | PRN

## 2013-10-13 NOTE — Telephone Encounter (Signed)
rx filled per protocol  

## 2013-10-14 ENCOUNTER — Encounter: Payer: Self-pay | Admitting: Adult Health

## 2013-10-14 DIAGNOSIS — D509 Iron deficiency anemia, unspecified: Secondary | ICD-10-CM | POA: Insufficient documentation

## 2013-10-14 DIAGNOSIS — M109 Gout, unspecified: Secondary | ICD-10-CM | POA: Insufficient documentation

## 2013-10-14 DIAGNOSIS — K5900 Constipation, unspecified: Secondary | ICD-10-CM | POA: Insufficient documentation

## 2013-10-14 DIAGNOSIS — G4700 Insomnia, unspecified: Secondary | ICD-10-CM | POA: Insufficient documentation

## 2013-10-14 DIAGNOSIS — E039 Hypothyroidism, unspecified: Secondary | ICD-10-CM | POA: Insufficient documentation

## 2013-10-14 DIAGNOSIS — D649 Anemia, unspecified: Secondary | ICD-10-CM | POA: Insufficient documentation

## 2013-10-14 DIAGNOSIS — Z96652 Presence of left artificial knee joint: Secondary | ICD-10-CM | POA: Insufficient documentation

## 2013-10-14 DIAGNOSIS — E785 Hyperlipidemia, unspecified: Secondary | ICD-10-CM | POA: Insufficient documentation

## 2013-10-14 DIAGNOSIS — J309 Allergic rhinitis, unspecified: Secondary | ICD-10-CM | POA: Insufficient documentation

## 2013-10-14 DIAGNOSIS — R32 Unspecified urinary incontinence: Secondary | ICD-10-CM | POA: Insufficient documentation

## 2013-10-14 DIAGNOSIS — F329 Major depressive disorder, single episode, unspecified: Secondary | ICD-10-CM | POA: Insufficient documentation

## 2013-10-14 DIAGNOSIS — F32A Depression, unspecified: Secondary | ICD-10-CM | POA: Insufficient documentation

## 2013-10-14 MED ORDER — SENNOSIDES-DOCUSATE SODIUM 8.6-50 MG PO TABS: 2.0000 | ORAL_TABLET | Freq: Two times a day (BID) | ORAL | Status: DC

## 2013-10-14 MED ORDER — HYDROXYZINE HCL 10 MG PO TABS: 10.0000 mg | ORAL_TABLET | Freq: Every day | ORAL | Status: DC

## 2013-10-14 MED ORDER — POLYETHYLENE GLYCOL 3350 17 G PO PACK: 17.0000 g | PACK | Freq: Two times a day (BID) | ORAL | Status: DC

## 2013-10-14 MED ORDER — CETIRIZINE HCL 10 MG PO TABS: 10.0000 mg | ORAL_TABLET | Freq: Every day | ORAL | Status: DC

## 2013-10-14 NOTE — Progress Notes (Signed)
Patient ID: Melinda Kerr, female   DOB: July 23, 1935, 79 y.o.   MRN: [id number]     [facility]  Allergies  Allergen Reactions  . Oxybutynin     Bleeding gums, felt sick     Chief Complaint  Patient presents with  . Hospitalization Follow-up    HPI:  She has been hospitalized for a left knee replacement. She is here for short term rehab with her goal to return back home. She was taking zyrtec at home for her allergies; the claritin no longer worked for her. She had been taking atarax 25 mg nightly for sleep long term; we discussed at great length the use of this medication; will start a lower dose. She is constipated. She had been using reglan for her muscle spasms not robaxin.   Past Medical History  Diagnosis Date  . Hyperlipidemia   . Depression   . OSA on CPAP   . Spondylosis     with scoliosis  . Phlebitis   . Esophageal reflux   . Morbid obesity with BMI of 40.0-44.9, adult   . Hypertension     Past Surgical History  Procedure Laterality Date  . Tubal ligation  age 77  . Varicose vein surgery Bilateral age 56  . Wisdom tooth extraction  age 76  . Hernia repair  age 41    umblical  . Cholecystectomy  age 46  . Total knee arthroplasty Left 10/09/2013    Procedure: LEFT TOTAL KNEE ARTHROPLASTY;  Surgeon: [name], MD;  Location: [facility];  Service: Orthopedics;  Laterality: Left;    VITAL SIGNS BP 100/54  Pulse 88  Ht 5' (1.524 m)  Wt 242 lb (109.77 kg)  BMI 47.26 kg/m2   Patient's Medications  New Prescriptions   No medications on file  Previous Medications   ACETAMINOPHEN (TYLENOL) 500 MG TABLET    Take 1,000 mg by mouth 2 (two) times daily.    ALLOPURINOL (ZYLOPRIM) 300 MG TABLET    Take 300 mg by mouth daily with breakfast.    BISACODYL (DULCOLAX) 10 MG SUPPOSITORY    Place 1 suppository (10 mg total) rectally daily as needed for moderate constipation.   CITALOPRAM (CELEXA) 20 MG TABLET    Take 10 mg by mouth at bedtime.    CYCLOBENZAPRINE  (FLEXERIL) 10 MG TABLET    Take 1 tablet (10 mg total) by mouth 3 (three) times daily as needed for muscle spasms.   DOCUSATE SODIUM 100 MG CAPS    Take 100 mg by mouth 2 (two) times daily.   FERROUS SULFATE DRIED (SLOW FE) 160 (50 FE) MG TBCR SR TABLET    Take 160 mg by mouth daily.   FUROSEMIDE (LASIX) 40 MG TABLET    Take 20 mg by mouth.   LEVOTHYROXINE (SYNTHROID, LEVOTHROID) 125 MCG TABLET    Take 125 mcg by mouth daily before breakfast.   LISINOPRIL (PRINIVIL,ZESTRIL) 20 MG TABLET    Take 20 mg by mouth daily with breakfast.    LORATADINE (CLARITIN) 10 MG CAPS    Take 1 capsule by mouth as needed (allergy).    METHOCARBAMOL (ROBAXIN) 500 MG TABLET    Take 1 tablet (500 mg total) by mouth every 6 (six) hours as needed for muscle spasms.   OMEPRAZOLE (PRILOSEC OTC) 20 MG TABLET    Take 20 mg by mouth daily.   ONDANSETRON (ZOFRAN) 4 MG TABLET    Take 1 tablet (4 mg total) by mouth every 6 (six)  hours as needed for nausea.   OXYCODONE (OXY IR/ROXICODONE) 5 MG IMMEDIATE RELEASE TABLET    Take 1-2 tablets (5-10 mg total) by mouth every 3 (three) hours as needed for breakthrough pain.   POLYETHYLENE GLYCOL (MIRALAX / GLYCOLAX) PACKET    Take 17 g by mouth daily as needed for mild constipation.   RANITIDINE (ZANTAC) 300 MG TABLET    Take 300 mg by mouth at bedtime.   RIVAROXABAN (XARELTO) 10 MG TABS TABLET    Take 1 tablet (10 mg total) by mouth daily with breakfast. Take Xarelto for two and a half more weeks, then discontinue Xarelto. Once the patient has completed the blood thinner regimen, then take a Baby 81 mg Aspirin daily for four more weeks.   SIMVASTATIN (ZOCOR) 20 MG TABLET    Take 10 mg by mouth daily.    TOLTERODINE (DETROL LA) 4 MG 24 HR CAPSULE    Take 4 mg by mouth daily.  Modified Medications   No medications on file  Discontinued Medications   CETIRIZINE (ZYRTEC) 10 MG TABLET    Take 10 mg by mouth daily as needed for allergies.   HYDROXYZINE (VISTARIL) 25 MG CAPSULE    Take 25 mg  by mouth at bedtime.    METOCLOPRAMIDE (REGLAN) 5 MG TABLET    Take 1-2 tablets (5-10 mg total) by mouth every 8 (eight) hours as needed for nausea (if ondansetron (ZOFRAN) ineffective.).   TRAMADOL (ULTRAM) 50 MG TABLET    Take 1 tablet (50 mg total) by mouth every 6 (six) hours as needed (mild to moderate pain).    SIGNIFICANT DIAGNOSTIC EXAMS  10-02-13: chest x-ray: Hiatal hernia.  No acute abnormality is noted.   LABS REVIEWED:  10-02-13: wbc 7.2; hgb 12.5; hct 41.0; mcv 93.0; plt 245; glucose 89; bn 17; creat 0.84; k+4.4; na++136 liver normal albumin 3.7 10-12-13: wbc 12.1; hgb 11.1; hct 34.3 ;mcv 93.0; plt 182    Review of Systems  Constitutional: Negative for malaise/fatigue.  Respiratory: Negative for cough and shortness of breath.   Cardiovascular: Negative for chest pain, palpitations and leg swelling.  Gastrointestinal: Positive for constipation. Negative for heartburn and abdominal pain.  Musculoskeletal: Positive for joint pain.       Left knee   Skin: Negative.   Neurological: Negative for headaches.  Psychiatric/Behavioral: Negative for depression. The patient has insomnia. The patient is not nervous/anxious.      Physical Exam  Constitutional: She is oriented to person, place, and time. She appears well-developed and well-nourished. No distress.  obese  Neck: Neck supple. No JVD present. No thyromegaly present.  Cardiovascular: Normal rate, regular rhythm and intact distal pulses.   Respiratory: Effort normal and breath sounds normal. No respiratory distress. She has no wheezes.  GI: Soft. Bowel sounds are normal. She exhibits no distension. There is no tenderness.  Musculoskeletal: She exhibits no edema.  Is able to move all extremities; has limited range of motion left knee is status post replacement   Neurological: She is alert and oriented to person, place, and time.  Skin: Skin is warm and dry. She is not diaphoretic.  Incision line without signs of infection  present.   Psychiatric: She has a normal mood and affect.      ASSESSMENT/ PLAN:  1. Obstructive sleep apnea: will continue her cpap at night and will monitor  2. Osteoarthritis of knees status post left knee replacement: will continue therapy as directed; will continue xarelto 10 mg daily for a total  of 21 days will then start asa daily. Will continue oxycodone 5 or 10 mg every 3 hours as needed for pain; will continue tylenol 1 gm twice daily; will stop the robaxin and will continue the flexeril 10 mg three times daily as needed and will monitor her status.   3. Hypertension: is stable will continue lisinopril 20 mg daily and will monitor  4. Anemia: will continue slow iron daily  5. Dyslipidemia: will continue zocor 10 mg daily  6. UI: will continue detrol la 4 mg daily; she states without this medication; she leaks urine all the time  7. Hypothyroidism:will continue synthroid 125 mcg daily   8. Edema: will continue lasix 20 gm dai   9. Gout: no recent flares: will continue allopurinol 300 mg daily   10. Depression: is stable will continue celexa 10 mg daily   11. Jerrye Bushy: will continue prilosec 20 gm daily in the am and zantac 300 mg nightly   12: allergic rhinitis: will stop the claritin as this medication is ineffective; will begin zyrtec 10 mg daily   13: Constipation: will change her miralax to twice daily and will stop the colace and begin senna s 2 tabs twice daily and will monitor   14: insomnia: will start atarax at 10 mg nightly and will monitor   Time spent with patient 50 minutes.

## 2013-10-19 ENCOUNTER — Encounter: Payer: Self-pay | Admitting: Internal Medicine

## 2013-10-19 ENCOUNTER — Non-Acute Institutional Stay (SKILLED_NURSING_FACILITY): Payer: Medicare Other | Admitting: Internal Medicine

## 2013-10-19 DIAGNOSIS — Z9989 Dependence on other enabling machines and devices: Secondary | ICD-10-CM

## 2013-10-19 DIAGNOSIS — F3289 Other specified depressive episodes: Secondary | ICD-10-CM

## 2013-10-19 DIAGNOSIS — G4733 Obstructive sleep apnea (adult) (pediatric): Secondary | ICD-10-CM

## 2013-10-19 DIAGNOSIS — K5900 Constipation, unspecified: Secondary | ICD-10-CM | POA: Diagnosis not present

## 2013-10-19 DIAGNOSIS — I10 Essential (primary) hypertension: Secondary | ICD-10-CM | POA: Diagnosis not present

## 2013-10-19 DIAGNOSIS — IMO0002 Reserved for concepts with insufficient information to code with codable children: Secondary | ICD-10-CM | POA: Diagnosis not present

## 2013-10-19 DIAGNOSIS — M1710 Unilateral primary osteoarthritis, unspecified knee: Secondary | ICD-10-CM

## 2013-10-19 DIAGNOSIS — M109 Gout, unspecified: Secondary | ICD-10-CM

## 2013-10-19 DIAGNOSIS — F329 Major depressive disorder, single episode, unspecified: Secondary | ICD-10-CM

## 2013-10-19 DIAGNOSIS — D649 Anemia, unspecified: Secondary | ICD-10-CM

## 2013-10-19 DIAGNOSIS — R32 Unspecified urinary incontinence: Secondary | ICD-10-CM

## 2013-10-19 DIAGNOSIS — M179 Osteoarthritis of knee, unspecified: Secondary | ICD-10-CM

## 2013-10-19 DIAGNOSIS — E039 Hypothyroidism, unspecified: Secondary | ICD-10-CM

## 2013-10-19 DIAGNOSIS — F32A Depression, unspecified: Secondary | ICD-10-CM

## 2013-10-19 NOTE — Progress Notes (Signed)
Patient ID: Melinda Kerr, female   DOB: 05-01-1935, 79 y.o.   MRN: [id number]    [facility]    PCP: [name], MD  Code Status: full code  Allergies  Allergen Reactions  . Oxybutynin     Bleeding gums, felt sick    Chief Complaint: new admit  HPI:  79 y/o female patient is here for STR after hospital admission with severe left knee OA 10/09/13- 10/12/13. She underwent left total knee arthroplasty and is here for training exercise to help steady her gait. Goal is to return home. Her pain is currently under control. She complaints of being constipated. Denies abdominal pain, nausea or vomiting  Review of Systems  Constitutional: Negative for fever, chills, weight loss, malaise/fatigue and diaphoresis.  HENT: Negative for congestion, hearing loss and sore throat.   Eyes: Negative for blurred vision, double vision and discharge.  Respiratory: Negative for cough, sputum production, shortness of breath and wheezing.   Cardiovascular: Negative for chest pain, palpitations, orthopnea and leg swelling.  Gastrointestinal: Negative for heartburn, nausea, vomiting, abdominal pain, diarrhea  Genitourinary: Negative for dysuria, urgency, frequency and flank pain.  Musculoskeletal: Negative for back pain, falls and myalgias.  Skin: Negative for itching and rash.  Neurological: Positive for weakness. Negative for dizziness, tingling, focal weakness and headaches.  Psychiatric/Behavioral: Negative for depression and memory loss. The patient is not nervous/anxious.     Past Medical History  Diagnosis Date  . Hyperlipidemia   . Depression   . OSA on CPAP   . Spondylosis     with scoliosis  . Phlebitis   . Esophageal reflux   . Morbid obesity with BMI of 40.0-44.9, adult   . Hypertension    Past Surgical History  Procedure Laterality Date  . Tubal ligation  age 44  . Varicose vein surgery Bilateral age 51  . Wisdom tooth extraction  age 39  . Hernia repair   age 21    umblical  . Cholecystectomy  age 43  . Total knee arthroplasty Left 10/09/2013    Procedure: LEFT TOTAL KNEE ARTHROPLASTY;  Surgeon: [name], MD;  Location: [facility];  Service: Orthopedics;  Laterality: Left;   Social History:   reports that she has never smoked. She has never used smokeless tobacco. She reports that she does not drink alcohol or use illicit drugs.  Family History  Problem Relation Age of Onset  . Heart disease Mother   . Heart disease Father   . CVA Father     Medications: Patient's Medications  New Prescriptions   No medications on file  Previous Medications   ACETAMINOPHEN (TYLENOL) 500 MG TABLET    Take 1,000 mg by mouth 2 (two) times daily.    ALLOPURINOL (ZYLOPRIM) 300 MG TABLET    Take 300 mg by mouth daily with breakfast.    BISACODYL (DULCOLAX) 10 MG SUPPOSITORY    Place 1 suppository (10 mg total) rectally daily as needed for moderate constipation.   CETIRIZINE (ZYRTEC) 10 MG TABLET    Take 1 tablet (10 mg total) by mouth daily.   CITALOPRAM (CELEXA) 20 MG TABLET    Take 10 mg by mouth at bedtime.    CYCLOBENZAPRINE (FLEXERIL) 10 MG TABLET    Take 1 tablet (10 mg total) by mouth 3 (three) times daily as needed for muscle spasms.   FERROUS SULFATE DRIED (SLOW FE) 160 (50 FE) MG TBCR SR TABLET    Take 160 mg by mouth daily.  FUROSEMIDE (LASIX) 40 MG TABLET    Take 20 mg by mouth.   HYDROXYZINE (ATARAX/VISTARIL) 10 MG TABLET    Take 1 tablet (10 mg total) by mouth at bedtime.   LEVOTHYROXINE (SYNTHROID, LEVOTHROID) 125 MCG TABLET    Take 125 mcg by mouth daily before breakfast.   LISINOPRIL (PRINIVIL,ZESTRIL) 20 MG TABLET    Take 20 mg by mouth daily with breakfast.    OMEPRAZOLE (PRILOSEC OTC) 20 MG TABLET    Take 20 mg by mouth daily.   ONDANSETRON (ZOFRAN) 4 MG TABLET    Take 1 tablet (4 mg total) by mouth every 6 (six) hours as needed for nausea.   OXYCODONE (OXY IR/ROXICODONE) 5 MG IMMEDIATE RELEASE TABLET    Take 1-2 tablets (5-10 mg  total) by mouth every 3 (three) hours as needed for breakthrough pain.   POLYETHYLENE GLYCOL (MIRALAX / GLYCOLAX) PACKET    Take 17 g by mouth 2 (two) times daily.   RANITIDINE (ZANTAC) 300 MG TABLET    Take 300 mg by mouth at bedtime.   RIVAROXABAN (XARELTO) 10 MG TABS TABLET    Take 1 tablet (10 mg total) by mouth daily with breakfast. Take Xarelto for two and a half more weeks, then discontinue Xarelto. Once the patient has completed the blood thinner regimen, then take a Baby 81 mg Aspirin daily for four more weeks.   SENNA-DOCUSATE (SENOKOT-S) 8.6-50 MG PER TABLET    Take 2 tablets by mouth 2 (two) times daily.   SIMVASTATIN (ZOCOR) 20 MG TABLET    Take 10 mg by mouth daily.    TOLTERODINE (DETROL LA) 4 MG 24 HR CAPSULE    Take 4 mg by mouth daily.  Modified Medications   No medications on file  Discontinued Medications   No medications on file     Physical Exam: BP 132/78  Pulse 89  Temp(Src) 98.4 F (36.9 C)  Resp 18  SpO2 95%  Constitutional: She is oriented to person, place, and time. She appears well-developed and well-nourished. No distress. obese  Neck: Neck supple. No JVD present. No thyromegaly present.  Cardiovascular: Normal rate, regular rhythm and intact distal pulses.   Respiratory: Effort normal and breath sounds normal. No respiratory distress. She has no wheezes.  GI: Soft. Bowel sounds are normal. She exhibits no distension. There is no tenderness.  Musculoskeletal: She exhibits no edema. Is able to move all extremities. has limited range of motion left knee is status post replacement   Neurological: She is alert and oriented to person, place, and time.  Skin: Skin is warm and dry. She is not diaphoretic. Incision line without signs of infection present.  steri-strip in place Psychiatric: She has a normal mood and affect.    Labs reviewed: Basic Metabolic Panel:  Recent Labs  10/02/13 1410 10/10/13 0531 10/11/13 0519  NA 136* 140 134*  K 4.4 4.8 4.9    CL 100 105 101  CO2 27 26 24   GLUCOSE 89 142* 154*  BUN 17 18 20   CREATININE 0.84 0.81 0.77  CALCIUM 9.8 8.8 7.9*   Liver Function Tests:  Recent Labs  10/02/13 1410  AST 18  ALT 13  ALKPHOS 58  BILITOT 0.5  PROT 6.4  ALBUMIN 3.7   No results found for this basename: LIPASE, AMYLASE,  in the last 8760 hours No results found for this basename: AMMONIA,  in the last 8760 hours CBC:  Recent Labs  10/10/13 0531 10/11/13 0519 10/12/13 0555  WBC  13.5* 9.9 12.1*  HGB 11.6* 11.9* 11.1*  HCT 34.7* 35.6* 34.3*  MCV 92.8 93.9 93.0  PLT 206 202 182    Radiological Exams: X-Rays:Dg Chest 2 View  10/02/2013   CLINICAL DATA:  Preoperative evaluation for knee replacement  EXAM: CHEST  2 VIEW  COMPARISON:  None.  FINDINGS: The cardiac shadow is within normal limits. A large hiatal hernia is noted. The lungs are clear bilaterally. No acute bony abnormality is seen.  IMPRESSION: Hiatal hernia.  No acute abnormality is noted.   Electronically Signed   By: Inez Catalina M.D.   On: 10/02/2013 14:53    Assessment/Plan  Left knee OA- s/p left total knee arthroplasty. Here for STR. To work with PT and OT. Skin care. Has follow up with orthopedic. Continue xarelto for dvt prophylaxis. Continue oxycodone 5 or 10 mg every 3 hours as needed for pain with tylenol 1 gm twice daily. Also continue prn flexeril. Resume aspirin after completing xarelto.  Constipation- on miralax twice daily and senna s 2 tabs twice daily for now. If no bowel movement for > 2 days, consider suppository  Urinary incontinence- continue her detrol and monitor for worsening of symptoms  Hypothyroidism- continue synthroid 125 mcg daily   Anemia- check h/h. Continue ferrous sulfate  Jerrye Bushy- will continue prilosec 20 gm daily in the am and zantac 300 mg nightly   Obstructive sleep apnea- continue her cpap at night and will monitor  Gout- no recent flares: will continue allopurinol 300 mg daily   Depression- continue  celexa 10 mg daily  Hypertension- will continue lisinopril 20 mg daily and lasix 20 mg daily and will monitor bp readings. Check bmp   Family/ staff Communication: reviewed care plan with patient and nursing supervisor   Goals of care: to return home after STR   Labs/tests ordered: cbc, cmp

## 2013-10-27 ENCOUNTER — Non-Acute Institutional Stay (SKILLED_NURSING_FACILITY): Payer: Medicare Other | Admitting: Adult Health

## 2013-10-27 DIAGNOSIS — Z96652 Presence of left artificial knee joint: Secondary | ICD-10-CM

## 2013-10-27 DIAGNOSIS — Z96659 Presence of unspecified artificial knee joint: Secondary | ICD-10-CM | POA: Diagnosis not present

## 2013-10-27 DIAGNOSIS — M179 Osteoarthritis of knee, unspecified: Secondary | ICD-10-CM

## 2013-10-27 DIAGNOSIS — M1710 Unilateral primary osteoarthritis, unspecified knee: Secondary | ICD-10-CM | POA: Diagnosis not present

## 2013-10-27 DIAGNOSIS — IMO0002 Reserved for concepts with insufficient information to code with codable children: Secondary | ICD-10-CM

## 2013-10-27 DIAGNOSIS — Z6841 Body Mass Index (BMI) 40.0 and over, adult: Secondary | ICD-10-CM | POA: Diagnosis not present

## 2013-10-30 ENCOUNTER — Non-Acute Institutional Stay (SKILLED_NURSING_FACILITY): Payer: Medicare Other | Admitting: Adult Health

## 2013-10-30 ENCOUNTER — Encounter: Payer: Self-pay | Admitting: Adult Health

## 2013-10-30 DIAGNOSIS — Z96659 Presence of unspecified artificial knee joint: Secondary | ICD-10-CM

## 2013-10-30 DIAGNOSIS — IMO0002 Reserved for concepts with insufficient information to code with codable children: Secondary | ICD-10-CM | POA: Diagnosis not present

## 2013-10-30 DIAGNOSIS — G4733 Obstructive sleep apnea (adult) (pediatric): Secondary | ICD-10-CM

## 2013-10-30 DIAGNOSIS — M179 Osteoarthritis of knee, unspecified: Secondary | ICD-10-CM

## 2013-10-30 DIAGNOSIS — Z6841 Body Mass Index (BMI) 40.0 and over, adult: Secondary | ICD-10-CM

## 2013-10-30 DIAGNOSIS — M1710 Unilateral primary osteoarthritis, unspecified knee: Secondary | ICD-10-CM

## 2013-10-30 DIAGNOSIS — Z9989 Dependence on other enabling machines and devices: Secondary | ICD-10-CM

## 2013-10-30 DIAGNOSIS — Z96652 Presence of left artificial knee joint: Secondary | ICD-10-CM

## 2013-10-30 NOTE — Progress Notes (Signed)
Patient ID: Melinda Kerr, female   DOB: 03-May-1935, 79 y.o.   MRN: [id number]     [facility]  Allergies  Allergen Reactions  . Oxybutynin     Bleeding gums, felt sick     Chief Complaint  Patient presents with  . Discharge Note    HPI:  She was hospitalized for a left knee replacement. She was admitted to this facility for short term rehab and is ready for discharge. She is being discharged to home with home health for pt/ot. She will need a rollator and will need her prescriptions to be written.    Past Medical History  Diagnosis Date  . Hyperlipidemia   . Depression   . OSA on CPAP   . Spondylosis     with scoliosis  . Phlebitis   . Esophageal reflux   . Morbid obesity with BMI of 40.0-44.9, adult   . Hypertension     Past Surgical History  Procedure Laterality Date  . Tubal ligation  age 61  . Varicose vein surgery Bilateral age 20  . Wisdom tooth extraction  age 11  . Hernia repair  age 34    umblical  . Cholecystectomy  age 29  . Total knee arthroplasty Left 10/09/2013    Procedure: LEFT TOTAL KNEE ARTHROPLASTY;  Surgeon: [name], MD;  Location: [facility];  Service: Orthopedics;  Laterality: Left;    VITAL SIGNS BP 128/78  Pulse 70  Ht 5' (1.524 m)  Wt 242 lb (109.77 kg)  BMI 47.26 kg/m2   Patient's Medications  New Prescriptions   No medications on file  Previous Medications   ACETAMINOPHEN (TYLENOL) 500 MG TABLET    Take 1,000 mg by mouth 2 (two) times daily.    ALLOPURINOL (ZYLOPRIM) 300 MG TABLET    Take 300 mg by mouth daily with breakfast.    BISACODYL (DULCOLAX) 10 MG SUPPOSITORY    Place 1 suppository (10 mg total) rectally daily as needed for moderate constipation.   CETIRIZINE (ZYRTEC) 10 MG TABLET    Take 1 tablet (10 mg total) by mouth daily.   CITALOPRAM (CELEXA) 20 MG TABLET    Take 10 mg by mouth at bedtime.    CYCLOBENZAPRINE (FLEXERIL) 10 MG TABLET    Take 1 tablet (10 mg total) by mouth 3 (three) times daily as needed for  muscle spasms.   FERROUS SULFATE DRIED (SLOW FE) 160 (50 FE) MG TBCR SR TABLET    Take 160 mg by mouth daily.   FUROSEMIDE (LASIX) 40 MG TABLET    Take 20 mg by mouth.   HYDROXYZINE (ATARAX/VISTARIL) 10 MG TABLET    Take 1 tablet (10 mg total) by mouth at bedtime.   LEVOTHYROXINE (SYNTHROID, LEVOTHROID) 125 MCG TABLET    Take 125 mcg by mouth daily before breakfast.   LISINOPRIL (PRINIVIL,ZESTRIL) 20 MG TABLET    Take 20 mg by mouth daily with breakfast.    OMEPRAZOLE (PRILOSEC OTC) 20 MG TABLET    Take 20 mg by mouth daily.   ONDANSETRON (ZOFRAN) 4 MG TABLET    Take 1 tablet (4 mg total) by mouth every 6 (six) hours as needed for nausea.   OXYCODONE (OXY IR/ROXICODONE) 5 MG IMMEDIATE RELEASE TABLET    Take 1-2 tablets (5-10 mg total) by mouth every 3 (three) hours as needed for breakthrough pain.   POLYETHYLENE GLYCOL (MIRALAX / GLYCOLAX) PACKET    Take 17 g by mouth 2 (two) times daily.   RANITIDINE (  ZANTAC) 300 MG TABLET    Take 300 mg by mouth at bedtime.   RIVAROXABAN (XARELTO) 10 MG TABS TABLET    Take 1 tablet (10 mg total) by mouth daily with breakfast. Take Xarelto for two and a half more weeks, then discontinue Xarelto. Once the patient has completed the blood thinner regimen, then take a Baby 81 mg Aspirin daily for four more weeks.   SENNA-DOCUSATE (SENOKOT-S) 8.6-50 MG PER TABLET    Take 2 tablets by mouth 2 (two) times daily.   SIMVASTATIN (ZOCOR) 20 MG TABLET    Take 10 mg by mouth daily.    TOLTERODINE (DETROL LA) 4 MG 24 HR CAPSULE    Take 4 mg by mouth daily.  Modified Medications   No medications on file  Discontinued Medications   No medications on file    SIGNIFICANT DIAGNOSTIC EXAMS  10-02-13: chest x-ray: Hiatal hernia.  No acute abnormality is noted.   LABS REVIEWED:  10-02-13: wbc 7.2; hgb 12.5; hct 41.0; mcv 93.0; plt 245; glucose 89; bn 17; creat 0.84; k+4.4; na++136 liver normal albumin 3.7 10-12-13: wbc 12.1; hgb 11.1; hct 34.3 ;mcv 93.0; plt 182    Review of  Systems  Constitutional: Negative for malaise/fatigue.  Respiratory: Negative for cough and shortness of breath.   Cardiovascular: Negative for chest pain, palpitations and leg swelling.  Gastrointestinal: . Negative for heartburn; constipation and abdominal pain.  Musculoskeletal: no complaint of pain  Skin: Negative.   Neurological: Negative for headaches.  Psychiatric/Behavioral: Negative for depression. The patient is not nervous/anxious.      Physical Exam  Constitutional: She is oriented to person, place, and time. She appears well-developed and well-nourished. No distress.  obese  Neck: Neck supple. No JVD present. No thyromegaly present.  Cardiovascular: Normal rate, regular rhythm and intact distal pulses.   Respiratory: Effort normal and breath sounds normal. No respiratory distress. She has no wheezes.  GI: Soft. Bowel sounds are normal. She exhibits no distension. There is no tenderness.  Musculoskeletal: She exhibits no edema.  Is able to move all extremities;  Neurological: She is alert and oriented to person, place, and time.  Skin: Skin is warm and dry. She is not diaphoretic.   Psychiatric: She has a normal mood and affect.     ASSESSMENT/ PLAN:  Will discharge to home with home health for pt/ot. Will need a rollator; her prescriptions have been written,   Time spent with patient 45 minutes.

## 2013-11-01 DIAGNOSIS — F3289 Other specified depressive episodes: Secondary | ICD-10-CM | POA: Diagnosis not present

## 2013-11-01 DIAGNOSIS — Z5189 Encounter for other specified aftercare: Secondary | ICD-10-CM | POA: Diagnosis not present

## 2013-11-01 DIAGNOSIS — F329 Major depressive disorder, single episode, unspecified: Secondary | ICD-10-CM | POA: Diagnosis not present

## 2013-11-01 DIAGNOSIS — Z471 Aftercare following joint replacement surgery: Secondary | ICD-10-CM | POA: Diagnosis not present

## 2013-11-01 DIAGNOSIS — Z96659 Presence of unspecified artificial knee joint: Secondary | ICD-10-CM | POA: Diagnosis not present

## 2013-11-03 DIAGNOSIS — Z96659 Presence of unspecified artificial knee joint: Secondary | ICD-10-CM | POA: Diagnosis not present

## 2013-11-03 DIAGNOSIS — Z5189 Encounter for other specified aftercare: Secondary | ICD-10-CM | POA: Diagnosis not present

## 2013-11-03 DIAGNOSIS — F329 Major depressive disorder, single episode, unspecified: Secondary | ICD-10-CM | POA: Diagnosis not present

## 2013-11-03 DIAGNOSIS — F3289 Other specified depressive episodes: Secondary | ICD-10-CM | POA: Diagnosis not present

## 2013-11-03 DIAGNOSIS — Z471 Aftercare following joint replacement surgery: Secondary | ICD-10-CM | POA: Diagnosis not present

## 2013-11-07 DIAGNOSIS — Z5189 Encounter for other specified aftercare: Secondary | ICD-10-CM | POA: Diagnosis not present

## 2013-11-07 DIAGNOSIS — Z471 Aftercare following joint replacement surgery: Secondary | ICD-10-CM | POA: Diagnosis not present

## 2013-11-07 DIAGNOSIS — F329 Major depressive disorder, single episode, unspecified: Secondary | ICD-10-CM | POA: Diagnosis not present

## 2013-11-07 DIAGNOSIS — Z96659 Presence of unspecified artificial knee joint: Secondary | ICD-10-CM | POA: Diagnosis not present

## 2013-11-07 DIAGNOSIS — F3289 Other specified depressive episodes: Secondary | ICD-10-CM | POA: Diagnosis not present

## 2013-11-08 NOTE — Progress Notes (Signed)
Patient ID: Melinda Kerr, female   DOB: [birth year], 79 y.o.   MRN: [id number]     [facility]  Allergies  Allergen Reactions  . Oxybutynin     Bleeding gums, felt sick     Chief Complaint  Patient presents with  . Discharge Note    HPI:  She is being discharged to home with home health for pt/ot/nursing.she has been hospitalized for a left knee replacement.she will not need dme. Will need prescriptions to be written.    Past Medical History  Diagnosis Date  . Hyperlipidemia   . Depression   . OSA on CPAP   . Spondylosis     with scoliosis  . Phlebitis   . Esophageal reflux   . Morbid obesity with BMI of 40.0-44.9, adult   . Hypertension     Past Surgical History  Procedure Laterality Date  . Tubal ligation  age 39  . Varicose vein surgery Bilateral age 32  . Wisdom tooth extraction  age 14  . Hernia repair  age 73    umblical  . Cholecystectomy  age 73  . Total knee arthroplasty Left 10/09/2013    Procedure: LEFT TOTAL KNEE ARTHROPLASTY;  Surgeon: [name], MD;  Location: [facility];  Service: Orthopedics;  Laterality: Left;    VITAL SIGNS BP 128/75  Pulse 70  Ht 5' (1.524 m)  Wt 242 lb (109.77 kg)  BMI 47.26 kg/m2   Patient's Medications  New Prescriptions   No medications on file  Previous Medications   ACETAMINOPHEN (TYLENOL) 500 MG TABLET    Take 1,000 mg by mouth 2 (two) times daily.    ALLOPURINOL (ZYLOPRIM) 300 MG TABLET    Take 300 mg by mouth daily with breakfast.    BISACODYL (DULCOLAX) 10 MG SUPPOSITORY    Place 1 suppository (10 mg total) rectally daily as needed for moderate constipation.   CETIRIZINE (ZYRTEC) 10 MG TABLET    Take 1 tablet (10 mg total) by mouth daily.   CITALOPRAM (CELEXA) 20 MG TABLET    Take 10 mg by mouth at bedtime.    CYCLOBENZAPRINE (FLEXERIL) 10 MG TABLET    Take 1 tablet (10 mg total) by mouth 3 (three) times daily as needed for muscle spasms.   FERROUS SULFATE DRIED (SLOW FE) 160 (50 FE) MG TBCR SR TABLET     Take 160 mg by mouth daily.   FUROSEMIDE (LASIX) 40 MG TABLET    Take 20 mg by mouth.   HYDROXYZINE (ATARAX/VISTARIL) 10 MG TABLET    Take 1 tablet (10 mg total) by mouth at bedtime.   LEVOTHYROXINE (SYNTHROID, LEVOTHROID) 125 MCG TABLET    Take 125 mcg by mouth daily before breakfast.   LISINOPRIL (PRINIVIL,ZESTRIL) 20 MG TABLET    Take 20 mg by mouth daily with breakfast.    OMEPRAZOLE (PRILOSEC OTC) 20 MG TABLET    Take 20 mg by mouth daily.   ONDANSETRON (ZOFRAN) 4 MG TABLET    Take 1 tablet (4 mg total) by mouth every 6 (six) hours as needed for nausea.   OXYCODONE (OXY IR/ROXICODONE) 5 MG IMMEDIATE RELEASE TABLET    Take 1-2 tablets (5-10 mg total) by mouth every 3 (three) hours as needed for breakthrough pain.   POLYETHYLENE GLYCOL (MIRALAX / GLYCOLAX) PACKET    Take 17 g by mouth 2 (two) times daily.   RANITIDINE (ZANTAC) 300 MG TABLET    Take 300 mg by mouth at bedtime.   RIVAROXABAN (XARELTO)  10 MG TABS TABLET    Take 1 tablet (10 mg total) by mouth daily with breakfast. Take Xarelto for two and a half more weeks, then discontinue Xarelto. Once the patient has completed the blood thinner regimen, then take a Baby 81 mg Aspirin daily for four more weeks.   SENNA-DOCUSATE (SENOKOT-S) 8.6-50 MG PER TABLET    Take 2 tablets by mouth 2 (two) times daily.   SIMVASTATIN (ZOCOR) 20 MG TABLET    Take 10 mg by mouth daily.    TOLTERODINE (DETROL LA) 4 MG 24 HR CAPSULE    Take 4 mg by mouth daily.  Modified Medications   No medications on file  Discontinued Medications   No medications on file    SIGNIFICANT DIAGNOSTIC EXAMS  10-02-13: chest x-ray: Hiatal hernia.  No acute abnormality is noted.   LABS REVIEWED:  10-02-13: wbc 7.2; hgb 12.5; hct 41.0; mcv 93.0; plt 245; glucose 89; bn 17; creat 0.84; k+4.4; na++136 liver normal albumin 3.7 10-12-13: wbc 12.1; hgb 11.1; hct 34.3 ;mcv 93.0; plt 182    Review of Systems  Constitutional: Negative for malaise/fatigue.  Respiratory: Negative for  cough and shortness of breath.   Cardiovascular: Negative for chest pain, palpitations and leg swelling.  Gastrointestinal:  Negative for heartburn and abdominal pain.  Musculoskeletal: Positive for joint pain.       Left knee pain is managed    Skin: Negative.   Neurological: Negative for headaches.  Psychiatric/Behavioral: Negative for depression.  The patient is not nervous/anxious.      Physical Exam  Constitutional: She is oriented to person, place, and time. She appears well-developed and well-nourished. No distress.  obese  Neck: Neck supple. No JVD present. No thyromegaly present.  Cardiovascular: Normal rate, regular rhythm and intact distal pulses.   Respiratory: Effort normal and breath sounds normal. No respiratory distress. She has no wheezes.  GI: Soft. Bowel sounds are normal. She exhibits no distension. There is no tenderness.  Musculoskeletal: She exhibits no edema.  Is able to move all extremities;  Neurological: She is alert and oriented to person, place, and time.  Skin: Skin is warm and dry. She is not diaphoretic.  Psychiatric: She has a normal mood and affect.       ASSESSMENT/ PLAN:  Will discharge to home with home health for pt/ot/nursing. Will not need dme; her prescriptions habe been written   Time spent with patient 40 minutes.

## 2013-11-09 DIAGNOSIS — Z471 Aftercare following joint replacement surgery: Secondary | ICD-10-CM | POA: Diagnosis not present

## 2013-11-09 DIAGNOSIS — Z5189 Encounter for other specified aftercare: Secondary | ICD-10-CM | POA: Diagnosis not present

## 2013-11-09 DIAGNOSIS — F3289 Other specified depressive episodes: Secondary | ICD-10-CM | POA: Diagnosis not present

## 2013-11-09 DIAGNOSIS — F329 Major depressive disorder, single episode, unspecified: Secondary | ICD-10-CM | POA: Diagnosis not present

## 2013-11-09 DIAGNOSIS — Z96659 Presence of unspecified artificial knee joint: Secondary | ICD-10-CM | POA: Diagnosis not present

## 2013-11-13 DIAGNOSIS — IMO0002 Reserved for concepts with insufficient information to code with codable children: Secondary | ICD-10-CM | POA: Diagnosis not present

## 2013-11-13 DIAGNOSIS — M1710 Unilateral primary osteoarthritis, unspecified knee: Secondary | ICD-10-CM | POA: Diagnosis not present

## 2013-11-13 DIAGNOSIS — Z96659 Presence of unspecified artificial knee joint: Secondary | ICD-10-CM | POA: Diagnosis not present

## 2013-11-13 DIAGNOSIS — F329 Major depressive disorder, single episode, unspecified: Secondary | ICD-10-CM | POA: Diagnosis not present

## 2013-11-13 DIAGNOSIS — Z471 Aftercare following joint replacement surgery: Secondary | ICD-10-CM | POA: Diagnosis not present

## 2013-11-13 DIAGNOSIS — F3289 Other specified depressive episodes: Secondary | ICD-10-CM | POA: Diagnosis not present

## 2013-11-13 DIAGNOSIS — Z5189 Encounter for other specified aftercare: Secondary | ICD-10-CM | POA: Diagnosis not present

## 2013-11-17 DIAGNOSIS — M1710 Unilateral primary osteoarthritis, unspecified knee: Secondary | ICD-10-CM | POA: Diagnosis not present

## 2013-12-04 DIAGNOSIS — N952 Postmenopausal atrophic vaginitis: Secondary | ICD-10-CM | POA: Diagnosis not present

## 2013-12-19 DIAGNOSIS — J0100 Acute maxillary sinusitis, unspecified: Secondary | ICD-10-CM | POA: Diagnosis not present

## 2014-02-06 ENCOUNTER — Encounter: Payer: Self-pay | Admitting: Cardiology

## 2014-02-06 ENCOUNTER — Ambulatory Visit (INDEPENDENT_AMBULATORY_CARE_PROVIDER_SITE_OTHER): Payer: Medicare Other | Admitting: Cardiology

## 2014-02-06 VITALS — BP 106/62 | HR 79 | Ht 60.0 in | Wt 231.0 lb

## 2014-02-06 DIAGNOSIS — Z9989 Dependence on other enabling machines and devices: Principal | ICD-10-CM

## 2014-02-06 DIAGNOSIS — Z6841 Body Mass Index (BMI) 40.0 and over, adult: Secondary | ICD-10-CM | POA: Diagnosis not present

## 2014-02-06 DIAGNOSIS — G4733 Obstructive sleep apnea (adult) (pediatric): Secondary | ICD-10-CM | POA: Diagnosis not present

## 2014-02-06 DIAGNOSIS — I10 Essential (primary) hypertension: Secondary | ICD-10-CM | POA: Diagnosis not present

## 2014-02-06 NOTE — Patient Instructions (Signed)
Your physician recommends that you continue on your current medications as directed. Please refer to the Current Medication list given to you today.  Your physician wants you to follow-up in: 6 month ov You will receive a reminder letter in the mail two months in advance. If you don't receive a letter, please call our office to schedule the follow-up appointment.  

## 2014-02-06 NOTE — Progress Notes (Signed)
[facility], [address] [place], [place]  [address] Phone: [phone] Fax:  [phone]  Date:  02/06/2014   ID:  SOMARA FRYMIRE, DOB 1934-10-19, MRN [id number]  PCP:  [name], MD  Sleep Medicine:  Fransico Him, MD   History of Present Illness: Melinda Kerr is a 79 y.o. female with a history of obesity, OSA on CPAP and HTN who presents today for followup. She is doing well. She uses the full face mask which she tolerates well. She feels the pressure is adequate. She feels rested when she gets up in the am and has no daytime sleepiness. She exercise is limited due to difficulty with ambulation and walks with a cane due to knee problems. She had a TKR recently and is still sleeping in a chair due to knee pain.  She is going to have her other knee done in July.  She does water aerobics.    Wt Readings from Last 3 Encounters:  02/06/14 231 lb (104.781 kg)  10/30/13 242 lb (109.77 kg)  10/27/13 242 lb (109.77 kg)     Past Medical History  Diagnosis Date  . Hyperlipidemia   . Depression   . OSA on CPAP   . Spondylosis     with scoliosis  . Phlebitis   . Esophageal reflux   . Morbid obesity with BMI of 40.0-44.9, adult   . Hypertension     Current Outpatient Prescriptions  Medication Sig Dispense Refill  . acetaminophen (TYLENOL) 500 MG tablet Take 1,000 mg by mouth 2 (two) times daily.       Marland Kitchen alendronate (FOSAMAX) 70 MG tablet Take 70 mg by mouth once a week. Take with a full glass of water on an empty stomach.      Marland Kitchen allopurinol (ZYLOPRIM) 300 MG tablet Take 300 mg by mouth daily with breakfast.       . Ascorbic Acid (VITAMIN C PO) Take by mouth daily.      Marland Kitchen aspirin 81 MG tablet Take 81 mg by mouth daily.      . Calcium-Magnesium-Vitamin D (CALCIUM MAGNESIUM PO) Take by mouth daily.      . Cholecalciferol (VITAMIN D PO) Take by mouth as directed.      . citalopram (CELEXA) 20 MG tablet Take 20 mg by mouth at bedtime.       . furosemide (LASIX) 40 MG  tablet Take 20 mg by mouth.      Marland Kitchen HAWTHORN BERRY PO Take by mouth as directed.      . hydrOXYzine (ATARAX/VISTARIL) 10 MG tablet Take 25 mg by mouth at bedtime.      Marland Kitchen levothyroxine (SYNTHROID, LEVOTHROID) 125 MCG tablet Take 125 mcg by mouth daily before breakfast.      . lisinopril (PRINIVIL,ZESTRIL) 20 MG tablet Take 20 mg by mouth daily with breakfast.       . MELATONIN PO Take by mouth daily.      . Multiple Vitamin (MULTIVITAMIN) tablet Take 1 tablet by mouth daily.      Marland Kitchen omeprazole (PRILOSEC OTC) 20 MG tablet Take 20 mg by mouth daily.      . Pyridoxine HCl (VITAMIN B-6 PO) Take by mouth daily.      . ranitidine (ZANTAC) 300 MG tablet Take 300 mg by mouth at bedtime.      . simvastatin (ZOCOR) 20 MG tablet Take 40 mg by mouth daily.       Marland Kitchen tolterodine (DETROL LA) 4 MG 24 hr  capsule Take 4 mg by mouth daily.      . traMADol (ULTRAM) 50 MG tablet Take 50 mg by mouth every 6 (six) hours as needed.      Marland Kitchen VITAMIN E PO Take by mouth daily.       No current facility-administered medications for this visit.    Allergies:    Allergies  Allergen Reactions  . Oxybutynin     Bleeding gums, felt sick    Social History:  The patient  reports that she has never smoked. She has never used smokeless tobacco. She reports that she does not drink alcohol or use illicit drugs.   Family History:  The patient's family history includes CVA in her father; Heart disease in her father and mother.   ROS:  Please see the history of present illness.      All other systems reviewed and negative.   PHYSICAL EXAM: VS:  BP 106/62  Pulse 79  Ht 5' (1.524 m)  Wt 231 lb (104.781 kg)  BMI 45.11 kg/m2 Well nourished, well developed, in no acute distress HEENT: normal Neck: no JVD Cardiac:  normal S1, S2; RRR; no murmur Lungs:  clear to auscultation bilaterally, no wheezing, rhonchi or rales Abd: soft, nontender, no hepatomegaly Ext: no edema Skin: warm and dry Neuro:  CNs 2-12 intact, no focal  abnormalities noted   ASSESSMENT AND PLAN:  1.  Severe OSA on CPAP and tolerating well. Her download today showed an AHI of 1.7/hr and 54% compliance in using more than 4 hours nightly.  Her compliance was down due to surgery since last checked and going into rehab and sleeping in a chair for the most part.  She tries to use it but when her leg starts to hurt she takes it off.   - continue on current CPAP setting  2. HTN - controlled  - continue Lisinopril  3. Morbid obesity  - continue water aerobics.  Followup with me in 6 months    Signed, Fransico Him, MD 02/06/2014 2:52 PM

## 2014-03-12 DIAGNOSIS — N952 Postmenopausal atrophic vaginitis: Secondary | ICD-10-CM | POA: Diagnosis not present

## 2014-03-12 DIAGNOSIS — N951 Menopausal and female climacteric states: Secondary | ICD-10-CM | POA: Diagnosis not present

## 2014-03-12 DIAGNOSIS — Z124 Encounter for screening for malignant neoplasm of cervix: Secondary | ICD-10-CM | POA: Diagnosis not present

## 2014-03-19 ENCOUNTER — Inpatient Hospital Stay: Admit: 2014-03-19 | Payer: Self-pay | Admitting: Orthopedic Surgery

## 2014-03-19 SURGERY — ARTHROPLASTY, KNEE, TOTAL
Anesthesia: Choice | Site: Knee | Laterality: Right

## 2014-03-21 NOTE — Progress Notes (Signed)
Please put orders in EPIC as patient has pre-op appointment on 04/02/2014 at 130 pm! Thank you!

## 2014-03-22 ENCOUNTER — Other Ambulatory Visit: Payer: Self-pay | Admitting: Orthopedic Surgery

## 2014-03-23 DIAGNOSIS — Z780 Asymptomatic menopausal state: Secondary | ICD-10-CM | POA: Diagnosis not present

## 2014-03-26 ENCOUNTER — Encounter (HOSPITAL_COMMUNITY): Payer: Self-pay | Admitting: Pharmacy Technician

## 2014-04-02 ENCOUNTER — Encounter (HOSPITAL_COMMUNITY): Payer: Self-pay

## 2014-04-02 ENCOUNTER — Encounter (HOSPITAL_COMMUNITY)
Admission: RE | Admit: 2014-04-02 | Discharge: 2014-04-02 | Disposition: A | Discharge status: Home / Self Care | Payer: Medicare Other | Source: Ambulatory Visit | Attending: Orthopedic Surgery | Admitting: Orthopedic Surgery

## 2014-04-02 DIAGNOSIS — Z01812 Encounter for preprocedural laboratory examination: Secondary | ICD-10-CM | POA: Diagnosis not present

## 2014-04-02 HISTORY — DX: Unspecified osteoarthritis, unspecified site: M19.90

## 2014-04-02 LAB — SURGICAL PCR SCREEN
MRSA, PCR: NEGATIVE
Staphylococcus aureus: NEGATIVE

## 2014-04-02 LAB — URINALYSIS, ROUTINE W REFLEX MICROSCOPIC
Bilirubin Urine: NEGATIVE
Glucose, UA: NEGATIVE mg/dL
HGB URINE DIPSTICK: NEGATIVE
Ketones, ur: NEGATIVE mg/dL
Nitrite: NEGATIVE
PROTEIN: NEGATIVE mg/dL
SPECIFIC GRAVITY, URINE: 1.014 (ref 1.005–1.030)
UROBILINOGEN UA: 0.2 mg/dL (ref 0.0–1.0)
pH: 6 (ref 5.0–8.0)

## 2014-04-02 LAB — COMPREHENSIVE METABOLIC PANEL
ALBUMIN: 3.9 g/dL (ref 3.5–5.2)
ALT: 13 U/L (ref 0–35)
ANION GAP: 11 (ref 5–15)
AST: 20 U/L (ref 0–37)
Alkaline Phosphatase: 69 U/L (ref 39–117)
BUN: 26 mg/dL — AB (ref 6–23)
CALCIUM: 9.7 mg/dL (ref 8.4–10.5)
CHLORIDE: 101 meq/L (ref 96–112)
CO2: 28 mEq/L (ref 19–32)
Creatinine, Ser: 0.86 mg/dL (ref 0.50–1.10)
GFR calc Af Amer: 74 mL/min — ABNORMAL LOW (ref >90)
GFR calc non Af Amer: 64 mL/min — ABNORMAL LOW (ref >90)
Glucose, Bld: 92 mg/dL (ref 70–99)
Potassium: 4.3 mEq/L (ref 3.7–5.3)
Sodium: 140 mEq/L (ref 137–147)
TOTAL PROTEIN: 7 g/dL (ref 6.0–8.3)
Total Bilirubin: 0.4 mg/dL (ref 0.3–1.2)

## 2014-04-02 LAB — APTT: aPTT: 30 seconds (ref 24–37)

## 2014-04-02 LAB — URINE MICROSCOPIC-ADD ON

## 2014-04-02 LAB — CBC
HCT: 40.9 % (ref 36.0–46.0)
Hemoglobin: 13.4 g/dL (ref 12.0–15.0)
MCH: 30.6 pg (ref 26.0–34.0)
MCHC: 32.8 g/dL (ref 30.0–36.0)
MCV: 93.4 fL (ref 78.0–100.0)
PLATELETS: 240 10*3/uL (ref 150–400)
RBC: 4.38 MIL/uL (ref 3.87–5.11)
RDW: 13.6 % (ref 11.5–15.5)
WBC: 7.2 10*3/uL (ref 4.0–10.5)

## 2014-04-02 LAB — PROTIME-INR
INR: 1.09 (ref 0.00–1.49)
PROTHROMBIN TIME: 14.1 s (ref 11.6–15.2)

## 2014-04-02 NOTE — Progress Notes (Signed)
EKG 10/13/13 on EPIC, Chest x-ray 10/02/13 on EPIC, surgery clearance note 11/13/13 Dr. Alyson Ingles on chart

## 2014-04-02 NOTE — Patient Instructions (Addendum)
[place]  04/02/2014   Your procedure is scheduled on: Monday 04/09/14  Report to [facility] at 06:20 AM.  Call this number if you have problems the morning of surgery 336-: [phone]   Remember:   Do not eat food or drink liquids After Midnight.     Take these medicines the morning of surgery with A SIP OF WATER: allopurinol, levothyroxine, prilosec, tylenol if needed   Do not wear jewelry, make-up or nail polish.  Do not wear lotions, powders, or perfumes. You may wear deodorant.  Do not shave 48 hours prior to surgery. Men may shave face and neck.  Do not bring valuables to the hospital.  Contacts, dentures or bridgework may not be worn into surgery.  Leave suitcase in the car. After surgery it may be brought to your room.  For patients admitted to the hospital, checkout time is 11:00 AM the day of discharge.    Please read over the following fact sheets that you were given: MRSA Information Paulette Blanch, RN  pre op nurse call if needed [phone]    [facility] - Preparing for Surgery Before surgery, you can play an important role.  Because skin is not sterile, your skin needs to be as free of germs as possible.  You can reduce the number of germs on your skin by washing with CHG (chlorahexidine gluconate) soap before surgery.  CHG is an antiseptic cleaner which kills germs and bonds with the skin to continue killing germs even after washing. Please DO NOT use if you have an allergy to CHG or antibacterial soaps.  If your skin becomes reddened/irritated stop using the CHG and inform your nurse when you arrive at Short Stay. Do not shave (including legs and underarms) for at least 48 hours prior to the first CHG shower.  You may shave your face/neck. Please follow these instructions carefully:  1.  Shower with CHG Soap the night before surgery and the  morning of Surgery.  2.  If you choose to wash your hair, wash your hair first as usual with your   normal  shampoo.  3.  After you shampoo, rinse your hair and body thoroughly to remove the  shampoo.                            4.  Use CHG as you would any other liquid soap.  You can apply chg directly  to the skin and wash                       Gently with a scrungie or clean washcloth.  5.  Apply the CHG Soap to your body ONLY FROM THE NECK DOWN.   Do not use on face/ open                           Wound or open sores. Avoid contact with eyes, ears mouth and genitals (private parts).                       Wash face,  Genitals (private parts) with your normal soap.             6.  Wash thoroughly, paying special attention to the area where your surgery  will be performed.  7.  Thoroughly rinse your body with warm water from the  neck down.  8.  DO NOT shower/wash with your normal soap after using and rinsing off  the CHG Soap.                9.  Pat yourself dry with a clean towel.            10.  Wear clean pajamas.            11.  Place clean sheets on your bed the night of your first shower and do not  sleep with pets. Day of Surgery : Do not apply any lotions/deodorants the morning of surgery.  Please wear clean clothes to the hospital/surgery center.  FAILURE TO FOLLOW THESE INSTRUCTIONS MAY RESULT IN THE CANCELLATION OF YOUR SURGERY PATIENT SIGNATURE_________________________________  NURSE SIGNATURE__________________________________  ________________________________________________________________________  WHAT IS A BLOOD TRANSFUSION? Blood Transfusion Information  A transfusion is the replacement of blood or some of its parts. Blood is made up of multiple cells which provide different functions.  Red blood cells carry oxygen and are used for blood loss replacement.  White blood cells fight against infection.  Platelets control bleeding.  Plasma helps clot blood.  Other blood products are available for specialized needs, such as hemophilia or other clotting  disorders. BEFORE THE TRANSFUSION  Who gives blood for transfusions?   Healthy volunteers who are fully evaluated to make sure their blood is safe. This is blood bank blood. Transfusion therapy is the safest it has ever been in the practice of medicine. Before blood is taken from a donor, a complete history is taken to make sure that person has no history of diseases nor engages in risky social behavior (examples are intravenous drug use or sexual activity with multiple partners). The donor's travel history is screened to minimize risk of transmitting infections, such as malaria. The donated blood is tested for signs of infectious diseases, such as HIV and hepatitis. The blood is then tested to be sure it is compatible with you in order to minimize the chance of a transfusion reaction. If you or a relative donates blood, this is often done in anticipation of surgery and is not appropriate for emergency situations. It takes many days to process the donated blood. RISKS AND COMPLICATIONS Although transfusion therapy is very safe and saves many lives, the main dangers of transfusion include:   Getting an infectious disease.  Developing a transfusion reaction. This is an allergic reaction to something in the blood you were given. Every precaution is taken to prevent this. The decision to have a blood transfusion has been considered carefully by your caregiver before blood is given. Blood is not given unless the benefits outweigh the risks. AFTER THE TRANSFUSION  Right after receiving a blood transfusion, you will usually feel much better and more energetic. This is especially true if your red blood cells have gotten low (anemic). The transfusion raises the level of the red blood cells which carry oxygen, and this usually causes an energy increase.  The nurse administering the transfusion will monitor you carefully for complications. HOME CARE INSTRUCTIONS  No special instructions are needed after a  transfusion. You may find your energy is better. Speak with your caregiver about any limitations on activity for underlying diseases you may have. SEEK MEDICAL CARE IF:   Your condition is not improving after your transfusion.  You develop redness or irritation at the intravenous (IV) site. SEEK IMMEDIATE MEDICAL CARE IF:  Any of the following symptoms occur over the next 12 hours:  Shaking chills.  You have a temperature by mouth above 102 F (38.9 C), not controlled by medicine.  Chest, back, or muscle pain.  People around you feel you are not acting correctly or are confused.  Shortness of breath or difficulty breathing.  Dizziness and fainting.  You get a rash or develop hives.  You have a decrease in urine output.  Your urine turns a dark color or changes to pink, red, or brown. Any of the following symptoms occur over the next 10 days:  You have a temperature by mouth above 102 F (38.9 C), not controlled by medicine.  Shortness of breath.  Weakness after normal activity.  The white part of the eye turns yellow (jaundice).  You have a decrease in the amount of urine or are urinating less often.  Your urine turns a dark color or changes to pink, red, or brown. Document Released: 09/11/2000 Document Revised: 12/07/2011 Document Reviewed: 04/30/2008 Exit[employer] Patient Information 2014 [place].  _______________________________________________________________________  Incentive Spirometer  An incentive spirometer is a tool that can help keep your lungs clear and active. This tool measures how well you are filling your lungs with each breath. Taking long deep breaths may help reverse or decrease the chance of developing breathing (pulmonary) problems (especially infection) following:  A long period of time when you are unable to move or be active. BEFORE THE PROCEDURE   If the spirometer includes an indicator to show your best effort, your nurse or  respiratory therapist will set it to a desired goal.  If possible, sit up straight or lean slightly forward. Try not to slouch.  Hold the incentive spirometer in an upright position. INSTRUCTIONS FOR USE  1. Sit on the edge of your bed if possible, or sit up as far as you can in bed or on a chair. 2. Hold the incentive spirometer in an upright position. 3. Breathe out normally. 4. Place the mouthpiece in your mouth and seal your lips tightly around it. 5. Breathe in slowly and as deeply as possible, raising the piston or the ball toward the top of the column. 6. Hold your breath for 3-5 seconds or for as long as possible. Allow the piston or ball to fall to the bottom of the column. 7. Remove the mouthpiece from your mouth and breathe out normally. 8. Rest for a few seconds and repeat Steps 1 through 7 at least 10 times every 1-2 hours when you are awake. Take your time and take a few normal breaths between deep breaths. 9. The spirometer may include an indicator to show your best effort. Use the indicator as a goal to work toward during each repetition. 10. After each set of 10 deep breaths, practice coughing to be sure your lungs are clear. If you have an incision (the cut made at the time of surgery), support your incision when coughing by placing a pillow or rolled up towels firmly against it. Once you are able to get out of bed, walk around indoors and cough well. You may stop using the incentive spirometer when instructed by your caregiver.  RISKS AND COMPLICATIONS  Take your time so you do not get dizzy or light-headed.  If you are in pain, you may need to take or ask for pain medication before doing incentive spirometry. It is harder to take a deep breath if you are having pain. AFTER USE  Rest and breathe slowly and easily.  It can be helpful to keep track of  a log of your progress. Your caregiver can provide you with a simple table to help with this. If you are using the  spirometer at home, follow these instructions: [place] IF:   You are having difficultly using the spirometer.  You have trouble using the spirometer as often as instructed.  Your pain medication is not giving enough relief while using the spirometer.  You develop fever of 100.5 F (38.1 C) or higher. SEEK IMMEDIATE MEDICAL CARE IF:   You cough up bloody sputum that had not been present before.  You develop fever of 102 F (38.9 C) or greater.  You develop worsening pain at or near the incision site. MAKE SURE YOU:   Understand these instructions.  Will watch your condition.  Will get help right away if you are not doing well or get worse. Document Released: 01/25/2007 Document Revised: 12/07/2011 Document Reviewed: 03/28/2007 [facility] Patient Information 2014 [place], [place].   ________________________________________________________________________

## 2014-04-03 NOTE — Progress Notes (Signed)
Fax received from [employer] PA-C that states "pt notified 7/6 CIPRO 500mg  #6 BID was sent to [facility] on [address]" fax placed on chart.

## 2014-04-08 ENCOUNTER — Other Ambulatory Visit: Payer: Self-pay | Admitting: Orthopedic Surgery

## 2014-04-08 NOTE — H&P (Signed)
Melinda Kerr DOB: 14-Apr-1935 Widowed / Language: Cleophus Molt / Race: White Female  Date of Admission:  04-09-2014  Chief Complaint:  Right Knee Pain  History of Present Illness The patient is a 79 year old female who comes in today for a preoperative History and Physical. The patient is scheduled for a right total knee arthroplasty to be performed by Dr. Dione Plover. Aluisio, MD at [facility] on 04-09-2014. The patient is a 79 year old female who presented with knee complaints. The patient was seen for left greater than right knee pain. She has previously underwent a Left Total Knee Replacement and has been doing very well with regards to the left knee.  The patient reports right knee symptoms including: pain, stiffness and soreness.  The patient describes the severity of the symptoms as moderate in severity.The patient feels that the symptoms are worsening. Prior to being seen by Dr. Wynelle Link, the patient was previously evaluated in this clinic (with Dr. Delilah Shan) 1 year(s) ago. Previous work-up for this problem has included knee x-rays. Past treatment for this problem has included intra-articular injection of corticosteroids (and visco supplementation). Symptoms are exacerbated by motion at the knee and weight bearing. Symptoms are relieved by rest.  She is now ready to proceed with the second side, the right knee. Risks and benefits of the surgery have been discussed with the patient and they elect to proceed with surgery.  There are on active contraindications to upcoming procedure such as ongoing infection or progressive neurological disease.   Problem List/Past Medical(Alezandrew L Perkins, III PA-C; 03/13/2014 2:31 PM) Osteoarthritis, Knee (715.96) Aftercare following joint replacement surgery (V54.81  Z47.1) Knee Pain (719.46  M25.569) S/P total knee replacement (V43.65  Z96.659) Gout Osteoarthritis High blood pressure Hypothyroidism Osteoporosis Sleep Apnea. uses  CPAP   Allergies OXYBUTYNIN    Family History(Alezandrew L Perkins, III PA-C; 03/13/2014 2:17 PM) Heart Disease. First Degree Relatives. mother and father Cerebrovascular Accident. father Hypertension. First Degree Relatives. mother and father    Social History(Alezandrew Monika Salk, III PA-C; 03/13/2014 2:29 PM) Tobacco use. Never smoker. never smoker Exercise. Exercises weekly; does other Current work status. retired Marital status. widowed Number of flights of stairs before winded. 1 Illicit drug use. no Living situation. live alone Drug/Alcohol Rehab (Currently). no Drug/Alcohol Rehab (Previously). no Children. 0 Alcohol use. never consumed alcohol Tobacco / smoke exposure. no Pain Contract. no Post-Surgical Plans. Plan for rehab at [facility].    Medication History(Alezandrew L Perkins, III PA-C; 03/13/2014 2:17 PM) Tramadol (prn) Active. Tylenol (prn) Active. Melatonin Active. Vitamin B6 Active. Multivitamin Active. Aspirin 81mg  Active. Vitamin E Active. Vitamin C Active. Omeprazole (20MG  Capsule DR, Oral) Active. Ranitidine HCl (300MG  Capsule, Oral) Active. Loratadine (10MG  Tablet, Oral) Active. Ondansetron HCl (4MG  Tablet, Oral) Active. Cyclobenzaprine HCl (10MG  Tablet, Oral) Active. (prn) Lisinopril ( Oral) Specific dose unknown - Active. Slow Fe ( Oral) Specific dose unknown - Active. Synthroid (125MCG Tablet, Oral) Active. Fosamax Plus D (70-2800MG -UNIT Tablet, Oral) Active. Simvastatin ( Oral) Specific dose unknown - Active. Tolterodine Tartrate ER ( Oral) Specific dose unknown - Active. Furosemide (40MG  Tablet, Oral) Active. Citalopram Hydrobromide (20MG  Tablet, Oral) Active. Allopurinol (300MG  Tablet, Oral) Active. HydrOXYzine HCl ( Oral) Specific dose unknown - Active. Claritin (10MG  Tablet, Oral) Active.    Past Surgical History(Alezandrew L Perkins, III PA-C; 03/13/2014 2:32 PM) Gallbladder Surgery.  laporoscopic Tubal Ligation Total Knee Replacement - Left. Date: 10/09/2013.    Review of Systems(Alezandrew L Perkins, III PA-C; 03/13/2014 2:29 PM) General:Not  Present- Chills, Fever, Night Sweats, Fatigue, Weight Gain, Weight Loss and Memory Loss. Skin:Not Present- Hives, Itching, Rash, Eczema and Lesions. HEENT:Not Present- Tinnitus, Headache, Double Vision, Visual Loss, Hearing Loss and Dentures. Respiratory:Not Present- Shortness of breath with exertion, Shortness of breath at rest, Allergies, Coughing up blood and Chronic Cough. Cardiovascular:Not Present- Chest Pain, Racing/skipping heartbeats, Difficulty Breathing Lying Down, Murmur, Swelling and Palpitations. Gastrointestinal:Not Present- Bloody Stool, Heartburn, Abdominal Pain, Vomiting, Nausea, Constipation, Diarrhea, Difficulty Swallowing, Jaundice and Loss of appetitie. Female Genitourinary:Not Present- Blood in Urine, Urinary frequency, Weak urinary stream, Discharge, Flank Pain, Incontinence, Painful Urination, Urgency, Urinary Retention and Urinating at Night. Musculoskeletal:Present- Joint Pain. Not Present- Muscle Weakness, Muscle Pain, Joint Swelling, Back Pain, Morning Stiffness and Spasms. Neurological:Not Present- Tremor, Dizziness, Blackout spells, Paralysis, Difficulty with balance and Weakness. Psychiatric:Not Present- Insomnia.    Vitals BP: 108/62 (Sitting, Right Arm, Standard)   Physical Exam(Alezandrew L Perkins, III PA-C; 03/13/2014 2:38 PM) The physical exam findings are as follows:   General Mental Status - Alert, cooperative and good historian. General Appearance- pleasant. Not in acute distress. Orientation- Oriented X3. Build & Nutrition- Well nourished and Well developed.   Head and Neck Head- normocephalic, atraumatic . Neck Global Assessment- supple. no bruit auscultated on the right and no bruit auscultated on the left.   Eye Vision- Wears corrective lenses.  Pupil- Bilateral- Regular and Round. Motion- Bilateral- EOMI.   Chest and Lung Exam Auscultation: Breath sounds:- clear at anterior chest wall and - clear at posterior chest wall. Adventitious sounds:- No Adventitious sounds.   Cardiovascular Auscultation:Rhythm- Regular rate and rhythm. Heart Sounds- S1 WNL and S2 WNL. Murmurs & Other Heart Sounds:Auscultation of the heart reveals - No Murmurs.   Abdomen Inspection:Contour- Generalized moderate distention. Palpation/Percussion:Tenderness- Abdomen is non-tender to palpation. Rigidity (guarding)- Abdomen is soft. Auscultation:Auscultation of the abdomen reveals - Bowel sounds normal.   Female Genitourinary Not done, not pertinent to present illness  Musculoskeletal On exam, well developed female alert and oriented in no apparent distress. Right knee shows no effusion. There is some tenderness medial greater than lateral. There is no instability. The right knee range is 0-125. Slight crepitus on range of motion. Tender medial greater than lateral with no instability.  RADIOGRAPHS: AP and lateral of the right knee shows bone on bone arthritis in the medial and patellofemoral compartments.    Assessment & Plan(Alezandrew L Perkins, III PA-C; 03/13/2014 2:38 PM) Osteoarthritis, Knee (715.96) Impression: Right Knee  S/P Left total knee replacement (V43.65  Z96.659)   Plan is for a Right Total Knee Replacement by Dr. Wynelle Link.  Plan is to go to [facility]  PCP - Dr. Alyson Ingles  The patient does not have any contraindications and will receive TXA (tranexamic acid) prior to surgery.  Signed electronically by Ok Edwards, III PA-C

## 2014-04-09 ENCOUNTER — Encounter (HOSPITAL_COMMUNITY)
Admission: RE | Disposition: A | Discharge status: Skilled Nursing Facility | Payer: Self-pay | Source: Ambulatory Visit | Attending: Orthopedic Surgery

## 2014-04-09 ENCOUNTER — Encounter (HOSPITAL_COMMUNITY): Payer: Self-pay | Admitting: *Deleted

## 2014-04-09 ENCOUNTER — Inpatient Hospital Stay (HOSPITAL_COMMUNITY)
Admission: RE | Admit: 2014-04-09 | Discharge: 2014-04-12 | DRG: 470 | Disposition: A | Discharge status: Skilled Nursing Facility | Payer: Medicare Other | Source: Ambulatory Visit | Attending: Orthopedic Surgery | Admitting: Orthopedic Surgery

## 2014-04-09 ENCOUNTER — Inpatient Hospital Stay (HOSPITAL_COMMUNITY): Payer: Medicare Other | Admitting: Anesthesiology

## 2014-04-09 ENCOUNTER — Encounter (HOSPITAL_COMMUNITY): Payer: Medicare Other | Admitting: Anesthesiology

## 2014-04-09 DIAGNOSIS — G4700 Insomnia, unspecified: Secondary | ICD-10-CM | POA: Diagnosis present

## 2014-04-09 DIAGNOSIS — D509 Iron deficiency anemia, unspecified: Secondary | ICD-10-CM | POA: Diagnosis present

## 2014-04-09 DIAGNOSIS — S99919A Unspecified injury of unspecified ankle, initial encounter: Secondary | ICD-10-CM | POA: Diagnosis not present

## 2014-04-09 DIAGNOSIS — Z888 Allergy status to other drugs, medicaments and biological substances status: Secondary | ICD-10-CM | POA: Diagnosis not present

## 2014-04-09 DIAGNOSIS — Z8672 Personal history of thrombophlebitis: Secondary | ICD-10-CM | POA: Diagnosis not present

## 2014-04-09 DIAGNOSIS — Z8249 Family history of ischemic heart disease and other diseases of the circulatory system: Secondary | ICD-10-CM | POA: Diagnosis not present

## 2014-04-09 DIAGNOSIS — M4120 Other idiopathic scoliosis, site unspecified: Secondary | ICD-10-CM | POA: Diagnosis present

## 2014-04-09 DIAGNOSIS — E039 Hypothyroidism, unspecified: Secondary | ICD-10-CM | POA: Diagnosis present

## 2014-04-09 DIAGNOSIS — M479 Spondylosis, unspecified: Secondary | ICD-10-CM | POA: Diagnosis present

## 2014-04-09 DIAGNOSIS — D62 Acute posthemorrhagic anemia: Secondary | ICD-10-CM | POA: Diagnosis not present

## 2014-04-09 DIAGNOSIS — K219 Gastro-esophageal reflux disease without esophagitis: Secondary | ICD-10-CM | POA: Diagnosis present

## 2014-04-09 DIAGNOSIS — Z96651 Presence of right artificial knee joint: Secondary | ICD-10-CM

## 2014-04-09 DIAGNOSIS — M898X9 Other specified disorders of bone, unspecified site: Secondary | ICD-10-CM | POA: Diagnosis present

## 2014-04-09 DIAGNOSIS — E785 Hyperlipidemia, unspecified: Secondary | ICD-10-CM | POA: Diagnosis present

## 2014-04-09 DIAGNOSIS — R32 Unspecified urinary incontinence: Secondary | ICD-10-CM | POA: Diagnosis present

## 2014-04-09 DIAGNOSIS — M109 Gout, unspecified: Secondary | ICD-10-CM | POA: Diagnosis present

## 2014-04-09 DIAGNOSIS — Z6841 Body Mass Index (BMI) 40.0 and over, adult: Secondary | ICD-10-CM

## 2014-04-09 DIAGNOSIS — Z7982 Long term (current) use of aspirin: Secondary | ICD-10-CM

## 2014-04-09 DIAGNOSIS — M810 Age-related osteoporosis without current pathological fracture: Secondary | ICD-10-CM | POA: Diagnosis present

## 2014-04-09 DIAGNOSIS — F329 Major depressive disorder, single episode, unspecified: Secondary | ICD-10-CM | POA: Diagnosis present

## 2014-04-09 DIAGNOSIS — M1710 Unilateral primary osteoarthritis, unspecified knee: Secondary | ICD-10-CM | POA: Diagnosis not present

## 2014-04-09 DIAGNOSIS — Z823 Family history of stroke: Secondary | ICD-10-CM

## 2014-04-09 DIAGNOSIS — I10 Essential (primary) hypertension: Secondary | ICD-10-CM | POA: Diagnosis present

## 2014-04-09 DIAGNOSIS — F32A Depression, unspecified: Secondary | ICD-10-CM | POA: Diagnosis present

## 2014-04-09 DIAGNOSIS — M25569 Pain in unspecified knee: Secondary | ICD-10-CM | POA: Diagnosis not present

## 2014-04-09 DIAGNOSIS — M179 Osteoarthritis of knee, unspecified: Secondary | ICD-10-CM | POA: Diagnosis present

## 2014-04-09 DIAGNOSIS — R262 Difficulty in walking, not elsewhere classified: Secondary | ICD-10-CM | POA: Diagnosis not present

## 2014-04-09 DIAGNOSIS — Z471 Aftercare following joint replacement surgery: Secondary | ICD-10-CM | POA: Diagnosis not present

## 2014-04-09 DIAGNOSIS — D649 Anemia, unspecified: Secondary | ICD-10-CM | POA: Diagnosis not present

## 2014-04-09 DIAGNOSIS — K5900 Constipation, unspecified: Secondary | ICD-10-CM | POA: Diagnosis present

## 2014-04-09 DIAGNOSIS — G4733 Obstructive sleep apnea (adult) (pediatric): Secondary | ICD-10-CM | POA: Diagnosis present

## 2014-04-09 DIAGNOSIS — M1711 Unilateral primary osteoarthritis, right knee: Secondary | ICD-10-CM

## 2014-04-09 DIAGNOSIS — F3289 Other specified depressive episodes: Secondary | ICD-10-CM | POA: Diagnosis present

## 2014-04-09 DIAGNOSIS — Z9989 Dependence on other enabling machines and devices: Secondary | ICD-10-CM

## 2014-04-09 DIAGNOSIS — M6281 Muscle weakness (generalized): Secondary | ICD-10-CM | POA: Diagnosis not present

## 2014-04-09 DIAGNOSIS — R279 Unspecified lack of coordination: Secondary | ICD-10-CM | POA: Diagnosis not present

## 2014-04-09 DIAGNOSIS — Z79899 Other long term (current) drug therapy: Secondary | ICD-10-CM | POA: Diagnosis not present

## 2014-04-09 DIAGNOSIS — S8990XA Unspecified injury of unspecified lower leg, initial encounter: Secondary | ICD-10-CM | POA: Diagnosis not present

## 2014-04-09 HISTORY — PX: TOTAL KNEE ARTHROPLASTY: SHX125

## 2014-04-09 LAB — TYPE AND SCREEN
ABO/RH(D): A NEG
Antibody Screen: NEGATIVE

## 2014-04-09 SURGERY — ARTHROPLASTY, KNEE, TOTAL
Anesthesia: Spinal | Site: Knee | Laterality: Right

## 2014-04-09 MED ORDER — PROPOFOL 10 MG/ML IV BOLUS
INTRAVENOUS | Status: AC
  Filled 2014-04-09: qty 20

## 2014-04-09 MED ORDER — DOCUSATE SODIUM 100 MG PO CAPS
100.0000 mg | ORAL_CAPSULE | Freq: Two times a day (BID) | ORAL | Status: DC
  Administered 2014-04-09 – 2014-04-12 (×6): 100 mg via ORAL

## 2014-04-09 MED ORDER — BUPIVACAINE HCL 0.25 % IJ SOLN
INTRAMUSCULAR | Status: DC | PRN
  Administered 2014-04-09: 20 mL

## 2014-04-09 MED ORDER — SODIUM CHLORIDE 0.9 % IV SOLN
INTRAVENOUS | Status: DC
  Administered 2014-04-09 – 2014-04-10 (×2): via INTRAVENOUS

## 2014-04-09 MED ORDER — METOCLOPRAMIDE HCL 10 MG PO TABS: 5.0000 mg | ORAL_TABLET | Freq: Three times a day (TID) | ORAL | Status: DC | PRN

## 2014-04-09 MED ORDER — ONDANSETRON HCL 4 MG PO TABS: 4.0000 mg | ORAL_TABLET | Freq: Four times a day (QID) | ORAL | Status: DC | PRN

## 2014-04-09 MED ORDER — FESOTERODINE FUMARATE ER 8 MG PO TB24
8.0000 mg | ORAL_TABLET | Freq: Every day | ORAL | Status: DC
  Administered 2014-04-09 – 2014-04-12 (×4): 8 mg via ORAL
  Filled 2014-04-09 (×4): qty 1

## 2014-04-09 MED ORDER — DEXAMETHASONE 4 MG PO TABS
10.0000 mg | ORAL_TABLET | Freq: Every day | ORAL | Status: AC
  Administered 2014-04-10: 10 mg via ORAL
  Filled 2014-04-09: qty 1

## 2014-04-09 MED ORDER — DEXAMETHASONE SODIUM PHOSPHATE 10 MG/ML IJ SOLN
10.0000 mg | Freq: Once | INTRAMUSCULAR | Status: AC
  Administered 2014-04-09: 10 mg via INTRAVENOUS

## 2014-04-09 MED ORDER — PROMETHAZINE HCL 25 MG/ML IJ SOLN: 6.2500 mg | INTRAMUSCULAR | Status: DC | PRN

## 2014-04-09 MED ORDER — CEFAZOLIN SODIUM-DEXTROSE 2-3 GM-% IV SOLR
2.0000 g | Freq: Four times a day (QID) | INTRAVENOUS | Status: AC
  Administered 2014-04-09 (×2): 2 g via INTRAVENOUS
  Filled 2014-04-09 (×2): qty 50

## 2014-04-09 MED ORDER — SODIUM CHLORIDE 0.9 % IJ SOLN
INTRAMUSCULAR | Status: AC
  Filled 2014-04-09: qty 50

## 2014-04-09 MED ORDER — ACETAMINOPHEN 325 MG PO TABS: 650.0000 mg | ORAL_TABLET | Freq: Four times a day (QID) | ORAL | Status: DC | PRN

## 2014-04-09 MED ORDER — METHOCARBAMOL 500 MG PO TABS
500.0000 mg | ORAL_TABLET | Freq: Four times a day (QID) | ORAL | Status: DC | PRN
  Administered 2014-04-09 – 2014-04-11 (×6): 500 mg via ORAL
  Filled 2014-04-09 (×6): qty 1

## 2014-04-09 MED ORDER — MIDAZOLAM HCL 2 MG/2ML IJ SOLN
INTRAMUSCULAR | Status: AC
Start: 2014-04-09 — End: 2014-04-09
  Filled 2014-04-09: qty 2

## 2014-04-09 MED ORDER — CEFAZOLIN SODIUM-DEXTROSE 2-3 GM-% IV SOLR
2.0000 g | INTRAVENOUS | Status: AC
  Administered 2014-04-09: 2 g via INTRAVENOUS

## 2014-04-09 MED ORDER — MORPHINE SULFATE 2 MG/ML IJ SOLN: 1.0000 mg | INTRAMUSCULAR | Status: DC | PRN

## 2014-04-09 MED ORDER — POLYETHYLENE GLYCOL 3350 17 G PO PACK: 17.0000 g | PACK | Freq: Every day | ORAL | Status: DC | PRN

## 2014-04-09 MED ORDER — DEXTROSE 5 % IV SOLN
500.0000 mg | Freq: Four times a day (QID) | INTRAVENOUS | Status: DC | PRN
  Administered 2014-04-09: 500 mg via INTRAVENOUS
  Filled 2014-04-09: qty 5

## 2014-04-09 MED ORDER — ACETAMINOPHEN 10 MG/ML IV SOLN
1000.0000 mg | Freq: Once | INTRAVENOUS | Status: AC
  Administered 2014-04-09: 1000 mg via INTRAVENOUS
  Filled 2014-04-09: qty 100

## 2014-04-09 MED ORDER — TRANEXAMIC ACID 100 MG/ML IV SOLN
1000.0000 mg | INTRAVENOUS | Status: AC
  Administered 2014-04-09: 1000 mg via INTRAVENOUS
  Filled 2014-04-09: qty 10

## 2014-04-09 MED ORDER — BUPIVACAINE HCL (PF) 0.25 % IJ SOLN
INTRAMUSCULAR | Status: AC
  Filled 2014-04-09: qty 30

## 2014-04-09 MED ORDER — DIPHENHYDRAMINE HCL 12.5 MG/5ML PO ELIX: 12.5000 mg | ORAL_SOLUTION | ORAL | Status: DC | PRN

## 2014-04-09 MED ORDER — MEPERIDINE HCL 50 MG/ML IJ SOLN
INTRAMUSCULAR | Status: AC
  Filled 2014-04-09: qty 1

## 2014-04-09 MED ORDER — SODIUM CHLORIDE 0.9 % IR SOLN
Status: DC | PRN
  Administered 2014-04-09: 1000 mL

## 2014-04-09 MED ORDER — KETOROLAC TROMETHAMINE 15 MG/ML IJ SOLN: 7.5000 mg | Freq: Four times a day (QID) | INTRAMUSCULAR | Status: AC | PRN

## 2014-04-09 MED ORDER — SODIUM CHLORIDE 0.9 % IJ SOLN
INTRAMUSCULAR | Status: DC | PRN
  Administered 2014-04-09: 30 mL via INTRAVENOUS

## 2014-04-09 MED ORDER — LIDOCAINE HCL (CARDIAC) 20 MG/ML IV SOLN
INTRAVENOUS | Status: DC | PRN
  Administered 2014-04-09: 50 mg via INTRAVENOUS

## 2014-04-09 MED ORDER — CITALOPRAM HYDROBROMIDE 20 MG PO TABS
20.0000 mg | ORAL_TABLET | Freq: Every day | ORAL | Status: DC
  Administered 2014-04-09 – 2014-04-11 (×3): 20 mg via ORAL
  Filled 2014-04-09 (×5): qty 1

## 2014-04-09 MED ORDER — MENTHOL 3 MG MT LOZG: 1.0000 | LOZENGE | OROMUCOSAL | Status: DC | PRN

## 2014-04-09 MED ORDER — LIDOCAINE HCL (CARDIAC) 20 MG/ML IV SOLN
INTRAVENOUS | Status: AC
  Filled 2014-04-09: qty 5

## 2014-04-09 MED ORDER — SODIUM CHLORIDE 0.9 % IV SOLN: INTRAVENOUS | Status: DC

## 2014-04-09 MED ORDER — ACETAMINOPHEN 500 MG PO TABS
1000.0000 mg | ORAL_TABLET | Freq: Four times a day (QID) | ORAL | Status: AC
  Administered 2014-04-09 (×3): 1000 mg via ORAL
  Filled 2014-04-09 (×5): qty 2

## 2014-04-09 MED ORDER — SIMVASTATIN 20 MG PO TABS
20.0000 mg | ORAL_TABLET | Freq: Every day | ORAL | Status: DC
  Administered 2014-04-09 – 2014-04-11 (×3): 20 mg via ORAL
  Filled 2014-04-09 (×4): qty 1

## 2014-04-09 MED ORDER — BUPIVACAINE IN DEXTROSE 0.75-8.25 % IT SOLN
INTRATHECAL | Status: DC | PRN
  Administered 2014-04-09: 1.8 mL via INTRATHECAL

## 2014-04-09 MED ORDER — OMEPRAZOLE MAGNESIUM 20 MG PO TBEC
20.0000 mg | DELAYED_RELEASE_TABLET | Freq: Every day | ORAL | Status: DC
  Administered 2014-04-10 – 2014-04-12 (×3): 20 mg via ORAL
  Filled 2014-04-09 (×4): qty 1

## 2014-04-09 MED ORDER — ACETAMINOPHEN 650 MG RE SUPP: 650.0000 mg | Freq: Four times a day (QID) | RECTAL | Status: DC | PRN

## 2014-04-09 MED ORDER — LACTATED RINGERS IV SOLN
INTRAVENOUS | Status: DC | PRN
  Administered 2014-04-09 (×2): via INTRAVENOUS

## 2014-04-09 MED ORDER — PROPOFOL INFUSION 10 MG/ML OPTIME
INTRAVENOUS | Status: DC | PRN
  Administered 2014-04-09: 75 ug/kg/min via INTRAVENOUS

## 2014-04-09 MED ORDER — DEXAMETHASONE SODIUM PHOSPHATE 10 MG/ML IJ SOLN
10.0000 mg | Freq: Every day | INTRAMUSCULAR | Status: AC
  Filled 2014-04-09: qty 1

## 2014-04-09 MED ORDER — FENTANYL CITRATE 0.05 MG/ML IJ SOLN: 25.0000 ug | INTRAMUSCULAR | Status: DC | PRN

## 2014-04-09 MED ORDER — ONDANSETRON HCL 4 MG/2ML IJ SOLN
INTRAMUSCULAR | Status: AC
  Filled 2014-04-09: qty 2

## 2014-04-09 MED ORDER — HYDROXYZINE HCL 25 MG PO TABS
25.0000 mg | ORAL_TABLET | Freq: Every day | ORAL | Status: DC
  Administered 2014-04-09 – 2014-04-11 (×3): 25 mg via ORAL
  Filled 2014-04-09 (×4): qty 1

## 2014-04-09 MED ORDER — FAMOTIDINE 40 MG PO TABS
40.0000 mg | ORAL_TABLET | Freq: Every day | ORAL | Status: DC
  Administered 2014-04-09 – 2014-04-11 (×3): 40 mg via ORAL
  Filled 2014-04-09 (×4): qty 1

## 2014-04-09 MED ORDER — LEVOTHYROXINE SODIUM 125 MCG PO TABS
125.0000 ug | ORAL_TABLET | Freq: Every day | ORAL | Status: DC
  Administered 2014-04-10 – 2014-04-12 (×3): 125 ug via ORAL
  Filled 2014-04-09 (×4): qty 1

## 2014-04-09 MED ORDER — MEPERIDINE HCL 50 MG/ML IJ SOLN
6.2500 mg | INTRAMUSCULAR | Status: DC | PRN
  Administered 2014-04-09 (×2): 6.25 mg via INTRAVENOUS

## 2014-04-09 MED ORDER — TRAMADOL HCL 50 MG PO TABS: 50.0000 mg | ORAL_TABLET | Freq: Four times a day (QID) | ORAL | Status: DC | PRN

## 2014-04-09 MED ORDER — PHENOL 1.4 % MT LIQD: 1.0000 | OROMUCOSAL | Status: DC | PRN

## 2014-04-09 MED ORDER — FENTANYL CITRATE 0.05 MG/ML IJ SOLN
INTRAMUSCULAR | Status: AC
  Filled 2014-04-09: qty 2

## 2014-04-09 MED ORDER — FUROSEMIDE 20 MG PO TABS
20.0000 mg | ORAL_TABLET | Freq: Every morning | ORAL | Status: DC
  Administered 2014-04-09 – 2014-04-12 (×4): 20 mg via ORAL
  Filled 2014-04-09 (×4): qty 1

## 2014-04-09 MED ORDER — ONDANSETRON HCL 4 MG/2ML IJ SOLN
INTRAMUSCULAR | Status: DC | PRN
  Administered 2014-04-09: 4 mg via INTRAVENOUS

## 2014-04-09 MED ORDER — RIVAROXABAN 10 MG PO TABS
10.0000 mg | ORAL_TABLET | Freq: Every day | ORAL | Status: DC
  Administered 2014-04-10 – 2014-04-12 (×3): 10 mg via ORAL
  Filled 2014-04-09 (×4): qty 1

## 2014-04-09 MED ORDER — METOCLOPRAMIDE HCL 5 MG/ML IJ SOLN
5.0000 mg | Freq: Three times a day (TID) | INTRAMUSCULAR | Status: DC | PRN
Start: 2014-04-09 — End: 2014-04-12

## 2014-04-09 MED ORDER — LACTATED RINGERS IV SOLN
INTRAVENOUS | Status: DC
  Administered 2014-04-09: 11:00:00 via INTRAVENOUS

## 2014-04-09 MED ORDER — BISACODYL 10 MG RE SUPP: 10.0000 mg | Freq: Every day | RECTAL | Status: DC | PRN

## 2014-04-09 MED ORDER — ONDANSETRON HCL 4 MG/2ML IJ SOLN
4.0000 mg | Freq: Four times a day (QID) | INTRAMUSCULAR | Status: DC | PRN
  Administered 2014-04-11: 4 mg via INTRAVENOUS
  Filled 2014-04-09: qty 2

## 2014-04-09 MED ORDER — MIDAZOLAM HCL 5 MG/5ML IJ SOLN
INTRAMUSCULAR | Status: DC | PRN
  Administered 2014-04-09: 2 mg via INTRAVENOUS

## 2014-04-09 MED ORDER — CEFAZOLIN SODIUM-DEXTROSE 2-3 GM-% IV SOLR
INTRAVENOUS | Status: AC
  Filled 2014-04-09: qty 50

## 2014-04-09 MED ORDER — FLEET ENEMA 7-19 GM/118ML RE ENEM: 1.0000 | ENEMA | Freq: Once | RECTAL | Status: AC | PRN

## 2014-04-09 MED ORDER — BUPIVACAINE LIPOSOME 1.3 % IJ SUSP: 20.0000 mL | Freq: Once | INTRAMUSCULAR | Status: DC

## 2014-04-09 MED ORDER — OXYCODONE HCL 5 MG PO TABS
5.0000 mg | ORAL_TABLET | ORAL | Status: DC | PRN
  Administered 2014-04-09 (×4): 5 mg via ORAL
  Administered 2014-04-10 (×3): 10 mg via ORAL
  Administered 2014-04-10: 5 mg via ORAL
  Administered 2014-04-10 – 2014-04-11 (×6): 10 mg via ORAL
  Administered 2014-04-12 (×3): 5 mg via ORAL
  Filled 2014-04-09 (×2): qty 2
  Filled 2014-04-09 (×2): qty 1
  Filled 2014-04-09: qty 2
  Filled 2014-04-09: qty 1
  Filled 2014-04-09 (×5): qty 2
  Filled 2014-04-09: qty 1
  Filled 2014-04-09: qty 2
  Filled 2014-04-09: qty 1
  Filled 2014-04-09 (×2): qty 2
  Filled 2014-04-09: qty 1
  Filled 2014-04-09 (×2): qty 2

## 2014-04-09 MED ORDER — FENTANYL CITRATE 0.05 MG/ML IJ SOLN
INTRAMUSCULAR | Status: DC | PRN
  Administered 2014-04-09: 100 ug via INTRAVENOUS

## 2014-04-09 MED ORDER — 0.9 % SODIUM CHLORIDE (POUR BTL) OPTIME
TOPICAL | Status: DC | PRN
  Administered 2014-04-09: 1000 mL

## 2014-04-09 MED ORDER — ALLOPURINOL 300 MG PO TABS
300.0000 mg | ORAL_TABLET | Freq: Every day | ORAL | Status: DC
  Administered 2014-04-10 – 2014-04-12 (×3): 300 mg via ORAL
  Filled 2014-04-09 (×4): qty 1

## 2014-04-09 MED ORDER — DEXAMETHASONE SODIUM PHOSPHATE 10 MG/ML IJ SOLN
INTRAMUSCULAR | Status: AC
  Filled 2014-04-09: qty 1

## 2014-04-09 MED ORDER — BUPIVACAINE LIPOSOME 1.3 % IJ SUSP
INTRAMUSCULAR | Status: DC | PRN
  Administered 2014-04-09: 20 mL

## 2014-04-09 MED ORDER — BUPIVACAINE LIPOSOME 1.3 % IJ SUSP
20.0000 mL | Freq: Once | INTRAMUSCULAR | Status: DC
Start: 2014-04-09 — End: 2014-04-09
  Filled 2014-04-09: qty 20

## 2014-04-09 SURGICAL SUPPLY — 66 items
BAG SPEC THK2 15X12 ZIP CLS (MISCELLANEOUS) ×1
BAG ZIPLOCK 12X15 (MISCELLANEOUS) ×3 IMPLANT
BANDAGE ELASTIC 6 VELCRO ST LF (GAUZE/BANDAGES/DRESSINGS) ×3 IMPLANT
BANDAGE ESMARK 6X9 LF (GAUZE/BANDAGES/DRESSINGS) ×1 IMPLANT
BLADE SAG 18X100X1.27 (BLADE) ×3 IMPLANT
BLADE SAW SGTL 11.0X1.19X90.0M (BLADE) ×3 IMPLANT
BNDG CMPR 9X6 STRL LF SNTH (GAUZE/BANDAGES/DRESSINGS) ×1
BNDG ESMARK 6X9 LF (GAUZE/BANDAGES/DRESSINGS) ×3
BOWL SMART MIX CTS (DISPOSABLE) ×3 IMPLANT
CAPT RP KNEE ×2 IMPLANT
CEMENT HV SMART SET (Cement) ×6 IMPLANT
CLOSURE WOUND 1/2 X4 (GAUZE/BANDAGES/DRESSINGS) ×1
CUFF TOURN SGL QUICK 34 (TOURNIQUET CUFF) ×3
CUFF TRNQT CYL 34X4X40X1 (TOURNIQUET CUFF) ×1 IMPLANT
DECANTER SPIKE VIAL GLASS SM (MISCELLANEOUS) ×3 IMPLANT
DRAPE EXTREMITY T 121X128X90 (DRAPE) ×3 IMPLANT
DRAPE POUCH INSTRU U-SHP 10X18 (DRAPES) ×3 IMPLANT
DRAPE U-SHAPE 47X51 STRL (DRAPES) ×3 IMPLANT
DRSG ADAPTIC 3X8 NADH LF (GAUZE/BANDAGES/DRESSINGS) ×3 IMPLANT
DRSG PAD ABDOMINAL 8X10 ST (GAUZE/BANDAGES/DRESSINGS) ×3 IMPLANT
DURAPREP 26ML APPLICATOR (WOUND CARE) ×3 IMPLANT
ELECT REM PT RETURN 9FT ADLT (ELECTROSURGICAL) ×3
ELECTRODE REM PT RTRN 9FT ADLT (ELECTROSURGICAL) ×1 IMPLANT
EVACUATOR 1/8 PVC DRAIN (DRAIN) ×3 IMPLANT
FACESHIELD WRAPAROUND (MASK) ×15 IMPLANT
FACESHIELD WRAPAROUND OR TEAM (MASK) ×5 IMPLANT
GAUZE SPONGE 4X4 12PLY STRL (GAUZE/BANDAGES/DRESSINGS) ×3 IMPLANT
GLOVE BIO SURGEON STRL SZ7.5 (GLOVE) ×2 IMPLANT
GLOVE BIO SURGEON STRL SZ8 (GLOVE) ×3 IMPLANT
GLOVE BIOGEL PI IND STRL 6.5 (GLOVE) IMPLANT
GLOVE BIOGEL PI IND STRL 7.5 (GLOVE) IMPLANT
GLOVE BIOGEL PI IND STRL 8 (GLOVE) ×1 IMPLANT
GLOVE BIOGEL PI INDICATOR 6.5 (GLOVE) ×2
GLOVE BIOGEL PI INDICATOR 7.5 (GLOVE) ×2
GLOVE BIOGEL PI INDICATOR 8 (GLOVE) ×4
GLOVE SURG SS PI 6.5 STRL IVOR (GLOVE) ×2 IMPLANT
GLOVE SURG SS PI 7.5 STRL IVOR (GLOVE) ×6 IMPLANT
GOWN PREVENTION PLUS XXLARGE (GOWN DISPOSABLE) ×2 IMPLANT
GOWN STRL REUS W/TWL LRG LVL3 (GOWN DISPOSABLE) ×5 IMPLANT
GOWN STRL REUS W/TWL XL LVL3 (GOWN DISPOSABLE) ×2 IMPLANT
HANDPIECE INTERPULSE COAX TIP (DISPOSABLE) ×3
IMMOBILIZER KNEE 20 (SOFTGOODS) ×3
IMMOBILIZER KNEE 20 THIGH 36 (SOFTGOODS) ×1 IMPLANT
KIT BASIN OR (CUSTOM PROCEDURE TRAY) ×3 IMPLANT
MANIFOLD NEPTUNE II (INSTRUMENTS) ×3 IMPLANT
NDL SAFETY ECLIPSE 18X1.5 (NEEDLE) ×2 IMPLANT
NEEDLE HYPO 18GX1.5 SHARP (NEEDLE) ×6
NS IRRIG 1000ML POUR BTL (IV SOLUTION) ×3 IMPLANT
PACK TOTAL JOINT (CUSTOM PROCEDURE TRAY) ×3 IMPLANT
PADDING CAST COTTON 6X4 STRL (CAST SUPPLIES) ×4 IMPLANT
POSITIONER SURGICAL ARM (MISCELLANEOUS) ×3 IMPLANT
SET HNDPC FAN SPRY TIP SCT (DISPOSABLE) ×1 IMPLANT
SPONGE GAUZE 4X4 12PLY (GAUZE/BANDAGES/DRESSINGS) ×1 IMPLANT
STRIP CLOSURE SKIN 1/2X4 (GAUZE/BANDAGES/DRESSINGS) ×3 IMPLANT
SUCTION FRAZIER 12FR DISP (SUCTIONS) ×3 IMPLANT
SUT MNCRL AB 4-0 PS2 18 (SUTURE) ×3 IMPLANT
SUT VIC AB 2-0 CT1 27 (SUTURE) ×9
SUT VIC AB 2-0 CT1 TAPERPNT 27 (SUTURE) ×3 IMPLANT
SUT VLOC 180 0 24IN GS25 (SUTURE) ×3 IMPLANT
SYRINGE 20CC LL (MISCELLANEOUS) ×3 IMPLANT
SYRINGE 60CC LL (MISCELLANEOUS) ×3 IMPLANT
TOWEL OR 17X26 10 PK STRL BLUE (TOWEL DISPOSABLE) ×3 IMPLANT
TOWEL OR NON WOVEN STRL DISP B (DISPOSABLE) ×2 IMPLANT
TRAY FOLEY CATH 14FRSI W/METER (CATHETERS) ×3 IMPLANT
WATER STERILE IRR 1500ML POUR (IV SOLUTION) ×3 IMPLANT
WRAP KNEE MAXI GEL POST OP (GAUZE/BANDAGES/DRESSINGS) ×3 IMPLANT

## 2014-04-09 NOTE — Transfer of Care (Signed)
Immediate Anesthesia Transfer of Care Note  Patient: Melinda Kerr  Procedure(s) Performed: Procedure(s): RIGHT TOTAL KNEE ARTHROPLASTY (Right)  Patient Location: PACU  Anesthesia Type:Regional  Level of Consciousness: awake, alert  and oriented  Airway & Oxygen Therapy: Patient Spontanous Breathing and Patient connected to face mask oxygen  Post-op Assessment: Report given to PACU RN and Post -op Vital signs reviewed and stable  Post vital signs: Reviewed and stable  Complications: No apparent anesthesia complications

## 2014-04-09 NOTE — Plan of Care (Signed)
Problem: Consults Goal: Diagnosis- Total Joint Replacement Primary Total Knee     

## 2014-04-09 NOTE — Progress Notes (Signed)
Clinical Social Work Department CLINICAL SOCIAL WORK PLACEMENT NOTE 04/09/2014  Patient:  GENESE, QUEBEDEAUX  Account Number:  [id number] Admit date:  04/09/2014  Clinical Social Worker:  Werner Lean, LCSW  Date/time:  04/09/2014 01:57 PM  Clinical Social Work is seeking post-discharge placement for this patient at the following level of care:   SKILLED NURSING   (*CSW will update this form in Epic as items are completed)     Patient/family provided with [place] Department of Clinical Social Work's list of facilities offering this level of care within the geographic area requested by the patient (or if unable, by the patient's family).  04/09/2014  Patient/family informed of their freedom to choose among providers that offer the needed level of care, that participate in Medicare, Medicaid or managed care program needed by the patient, have an available bed and are willing to accept the patient.    Patient/family informed of [facility]' ownership interest in [facility], as well as of the fact that they are under no obligation to receive care at this facility.  PASARR submitted to EDS on 04/09/2014 PASARR number received on 04/09/2014  FL2 transmitted to all facilities in geographic area requested by pt/family on  04/09/2014 FL2 transmitted to all facilities within larger geographic area on   Patient informed that his/her managed care company has contracts with or will negotiate with  certain facilities, including the following:     Patient/family informed of bed offers received:  04/09/2014 Patient chooses bed at  Physician recommends and patient chooses bed at  [place]  Patient to be transferred to [place] on   Patient to be transferred to facility by  Patient and family notified of transfer on  Name of family member notified:    The following physician request were entered in Epic:   Additional Comments:  Werner Lean LCSW [phone]

## 2014-04-09 NOTE — Anesthesia Procedure Notes (Signed)
Spinal  Patient location during procedure: OR Staffing Anesthesiologist: Montez Hageman Performed by: anesthesiologist  Preanesthetic Checklist Completed: patient identified, site marked, surgical consent, pre-op evaluation, timeout performed, IV checked, risks and benefits discussed and monitors and equipment checked Spinal Block Patient position: sitting Prep: Betadine Patient monitoring: heart rate, continuous pulse ox and blood pressure Approach: right paramedian Location: L2-3 Injection technique: single-shot Needle Needle type: Spinocan  Needle gauge: 22 G Needle length: 9 cm Additional Notes Severe right deviation scoliosis.  Expiration date of kit checked and confirmed. Patient tolerated procedure well, without complications.

## 2014-04-09 NOTE — Evaluation (Signed)
Physical Therapy Evaluation Patient Details Name: Melinda Kerr MRN: [id number] DOB: May 04, 1935 Today's Date: 04/09/2014   History of Present Illness  R TKA  Clinical Impression  *Pt is s/p TKA resulting in the deficits listed below (see PT Problem List). ** Pt will benefit from skilled PT to increase their independence and safety with mobility to allow discharge to the venue listed below.   Pt walked 32' with RW and min A. Good progress expected. She plans to DC to [facility] ST[facility].  **    Follow Up Recommendations SNF    Equipment Recommendations  None recommended by PT    Recommendations for Other Services       Precautions / Restrictions        Mobility  Bed Mobility Overal bed mobility: Needs Assistance Bed Mobility: Supine to Sit     Supine to sit: Min assist     General bed mobility comments: min A for RLE  Transfers Overall transfer level: Needs assistance Equipment used: Rolling walker (2 wheeled) Transfers: Sit to/from Stand Sit to Stand: Mod assist         General transfer comment: assist to rise, cues for hand placment  Ambulation/Gait Ambulation/Gait assistance: Min assist Ambulation Distance (Feet): 26 Feet Assistive device: Rolling walker (2 wheeled) Gait Pattern/deviations: Step-to pattern   Gait velocity interpretation: Below normal speed for age/gender General Gait Details: cues and assist for positioning in RW, tends to push RW too far forward, verbal cues for sequencing and for flexed neck posture  Stairs            Wheelchair Mobility    Modified Rankin (Stroke Patients Only)       Balance Overall balance assessment: Needs assistance   Sitting balance-Leahy Scale: Good       Standing balance-Leahy Scale: Poor                               Pertinent Vitals/Pain *5/10 R knee with walking Ice applied, premedicated**    Home Living Family/patient expects to be discharged to:: Skilled nursing  facility Living Arrangements: Alone     Home Access: Stairs to enter   Entrance Stairs-Number of Steps: 1 Home Layout: One level Home Equipment: Walker - 2 wheels;Bedside commode;Cane - single point;Shower seat Additional Comments: plans to DC to [facility]    Prior Function Level of Independence: Independent with assistive device(s)         Comments: SPC when she goes out     Hand Dominance        Extremity/Trunk Assessment   Upper Extremity Assessment: Overall WFL for tasks assessed           Lower Extremity Assessment: RLE deficits/detail RLE Deficits / Details: knee flexion AAROM 45*, ankle WNL, can do SLR       Communication   Communication: No difficulties  Cognition Arousal/Alertness: Awake/alert Behavior During Therapy: WFL for tasks assessed/performed Overall Cognitive Status: Within Functional Limits for tasks assessed                      General Comments      Exercises Total Joint Exercises Ankle Circles/Pumps: AROM;Both;10 reps;Supine Quad Sets: AROM;Right;5 reps;Supine Heel Slides: AAROM;Right;10 reps;Supine      Assessment/Plan    PT Assessment Patient needs continued PT services  PT Diagnosis Difficulty walking;Acute pain   PT Problem List Decreased strength;Decreased range of motion;Decreased activity tolerance;Decreased mobility;Pain;Decreased knowledge  of use of DME  PT Treatment Interventions Functional mobility training;Gait training;DME instruction;Therapeutic activities;Patient/family education;Therapeutic exercise   PT Goals (Current goals can be found in the Care Plan section) Acute Rehab PT Goals Patient Stated Goal: to walk PT Goal Formulation: With patient Time For Goal Achievement: 04/23/14 Potential to Achieve Goals: Good    Frequency 7X/week   Barriers to discharge        Co-evaluation               End of Session Equipment Utilized During Treatment: Gait belt Activity Tolerance: Patient  tolerated treatment well Patient left: in chair;with call bell/phone within reach Nurse Communication: Mobility status         Time: 4193-7902 PT Time Calculation (min): 21 min   Charges:   PT Evaluation $Initial PT Evaluation Tier I: 1 Procedure PT Treatments $Gait Training: 8-22 mins   PT G Codes:          Philomena Doheny 04/09/2014, 3:59 PM [phone]

## 2014-04-09 NOTE — Op Note (Signed)
Pre-operative diagnosis- Osteoarthritis  Right knee(s)  Post-operative diagnosis- Osteoarthritis Right knee(s)  Procedure-  Right  Total Knee Arthroplasty  Surgeon- Dione Plover. Betty Brooks, MD  Assistant- Arlee Muslim, PA-C   Anesthesia-  Spinal  EBL-* No blood loss amount entered *   Drains Hemovac  Tourniquet time-  Total Tourniquet Time Documented: Thigh (Right) - 33 minutes Total: Thigh (Right) - 33 minutes     Complications- None  Condition-PACU - hemodynamically stable.   Brief Clinical Note  Melinda Kerr is a 79 y.o. year old female with end stage OA of her right knee with progressively worsening pain and dysfunction. She has constant pain, with activity and at rest and significant functional deficits with difficulties even with ADLs. She has had extensive non-op management including analgesics, injections of cortisone and viscosupplements, and home exercise program, but remains in significant pain with significant dysfunction.Radiographs show bone on bone arthritis medial and patellofemoral. She presents now for right Total Knee Arthroplasty.    Procedure in detail---   The patient is brought into the operating room and positioned supine on the operating table. After successful administration of  Spinal,   a tourniquet is placed high on the  Right thigh(s) and the lower extremity is prepped and draped in the usual sterile fashion. Time out is performed by the operating team and then the  Right lower extremity is wrapped in Esmarch, knee flexed and the tourniquet inflated to 300 mmHg.       A midline incision is made with a ten blade through the subcutaneous tissue to the level of the extensor mechanism. A fresh blade is used to make a medial parapatellar arthrotomy. Soft tissue over the proximal medial tibia is subperiosteally elevated to the joint line with a knife and into the semimembranosus bursa with a Cobb elevator. Soft tissue over the proximal lateral tibia is elevated with  attention being paid to avoiding the patellar tendon on the tibial tubercle. The patella is everted, knee flexed 90 degrees and the ACL and PCL are removed. Findings are bone on bone medial and patellofemoral with large global osteophytes.        The drill is used to create a starting hole in the distal femur and the canal is thoroughly irrigated with sterile saline to remove the fatty contents. The 5 degree Right  valgus alignment guide is placed into the femoral canal and the distal femoral cutting block is pinned to remove 10 mm off the distal femur. Resection is made with an oscillating saw.      The tibia is subluxed forward and the menisci are removed. The extramedullary alignment guide is placed referencing proximally at the medial aspect of the tibial tubercle and distally along the second metatarsal axis and tibial crest. The block is pinned to remove 90mm off the more deficient medial  side. Resection is made with an oscillating saw. Size 3is the most appropriate size for the tibia and the proximal tibia is prepared with the modular drill and keel punch for that size.      The femoral sizing guide is placed and size 4 is most appropriate. Rotation is marked off the epicondylar axis and confirmed by creating a rectangular flexion gap at 90 degrees. The size 4 cutting block is pinned in this rotation and the anterior, posterior and chamfer cuts are made with the oscillating saw. The intercondylar block is then placed and that cut is made.      Trial size 3 tibial component, trial  size 4 narrow posterior stabilized femur and a 10  mm posterior stabilized rotating platform insert trial is placed. Full extension is achieved with excellent varus/valgus and anterior/posterior balance throughout full range of motion. The patella is everted and thickness measured to be 22  mm. Free hand resection is taken to 12 mm, a 38 template is placed, lug holes are drilled, trial patella is placed, and it tracks normally.  Osteophytes are removed off the posterior femur with the trial in place. All trials are removed and the cut bone surfaces prepared with pulsatile lavage. Cement is mixed and once ready for implantation, the size 3 tibial implant, size  4 narrow posterior stabilized femoral component, and the size 38 patella are cemented in place and the patella is held with the clamp. The trial insert is placed and the knee held in full extension. The Exparel (20 ml mixed with 30 ml saline) and .25% Bupivicaine, are injected into the extensor mechanism, posterior capsule, medial and lateral gutters and subcutaneous tissues.  All extruded cement is removed and once the cement is hard the permanent 10 mm posterior stabilized rotating platform insert is placed into the tibial tray.      The wound is copiously irrigated with saline solution and the extensor mechanism closed over a hemovac drain with #1 V-loc suture. The tourniquet is released for a total tourniquet time of 33  minutes. Flexion against gravity is 135 degrees and the patella tracks normally. Subcutaneous tissue is closed with 2.0 vicryl and subcuticular with running 4.0 Monocryl. The incision is cleaned and dried and steri-strips and a bulky sterile dressing are applied. The limb is placed into a knee immobilizer and the patient is awakened and transported to recovery in stable condition.      Please note that a surgical assistant was a medical necessity for this procedure in order to perform it in a safe and expeditious manner. Surgical assistant was necessary to retract the ligaments and vital neurovascular structures to prevent injury to them and also necessary for proper positioning of the limb to allow for anatomic placement of the prosthesis.   Dione Plover Jairon Ripberger, MD    04/09/2014, 9:22 AM

## 2014-04-09 NOTE — Progress Notes (Signed)
Clinical Social Work Department BRIEF PSYCHOSOCIAL ASSESSMENT 04/09/2014  Patient:  Melinda Kerr, Melinda Kerr     Account Number:  [id number]     Admit date:  04/09/2014  Clinical Social Worker:  Lacie Scotts  Date/Time:  04/09/2014 01:47 PM  Referred by:  Physician  Date Referred:  04/09/2014 Referred for  SNF Placement   Other Referral:   Interview type:  Patient Other interview type:    PSYCHOSOCIAL DATA Living Status:  ALONE Admitted from facility:   Level of care:   Primary support name:  [name] Doree Fudge Primary support relationship to patient:  FAMILY Degree of support available:   supportive    CURRENT CONCERNS Current Concerns  Post-Acute Placement   Other Concerns:    SOCIAL WORK ASSESSMENT / PLAN Pt is a 79 yr old female living at home prior to hospitalization. CSW met with pt and niece to assist with d/c planning. This is a planned admission. Pt has made prior arrangements to have ST Rehab at [facility] following hospital d/c. CSW has contacted SNF and d/c plans have been confirmed. CSW will continue to follow to assist with d/c planning to SNF.   Assessment/plan status:  Psychosocial Support/Ongoing Assessment of Needs Other assessment/ plan:   Information/referral to community resources:   Insurance coverage for SNF and ambulance transport reviewed.    PATIENT'S/FAMILY'S RESPONSE TO PLAN OF CARE: " I'm not feeling any pain at all." Pt is happy her surgery is completed. She is looking forward to starting therapy and having her rehab at [facility]. "    Werner Lean LCSW [phone]

## 2014-04-09 NOTE — Interval H&P Note (Signed)
History and Physical Interval Note:  04/09/2014 7:06 AM  Melinda Kerr  has presented today for surgery, with the diagnosis of OA RIGHT KNEE  The various methods of treatment have been discussed with the patient and family. After consideration of risks, benefits and other options for treatment, the patient has consented to  Procedure(s): RIGHT TOTAL KNEE ARTHROPLASTY (Right) as a surgical intervention .  The patient's history has been reviewed, patient examined, no change in status, stable for surgery.  I have reviewed the patient's chart and labs.  Questions were answered to the patient's satisfaction.     [name]

## 2014-04-09 NOTE — Progress Notes (Signed)
RT placed patient on CPAP. Patient home setting at home is 12 cmH20, but patient said it was too much so RT adjusted to patient comfort, so setting is 8 cmH20. Water chamber is filled with sterile water for humidification. Oxygen bleed in of 2 liters. RT will continue to monitor as needed.

## 2014-04-09 NOTE — Anesthesia Postprocedure Evaluation (Signed)
  Anesthesia Post-op Note  Patient: Melinda Kerr  Procedure(s) Performed: Procedure(s) (LRB): RIGHT TOTAL KNEE ARTHROPLASTY (Right)  Patient Location: PACU  Anesthesia Type: Spinal  Level of Consciousness: awake and alert   Airway and Oxygen Therapy: Patient Spontanous Breathing  Post-op Pain: mild  Post-op Assessment: Post-op Vital signs reviewed, Patient's Cardiovascular Status Stable, Respiratory Function Stable, Patent Airway and No signs of Nausea or vomiting  Last Vitals:  Filed Vitals:   04/09/14 0607  BP: 108/56  Pulse: 78  Temp: 36.8 C  Resp: 18    Post-op Vital Signs: stable   Complications: No apparent anesthesia complications

## 2014-04-09 NOTE — Anesthesia Preprocedure Evaluation (Signed)
Anesthesia Evaluation  Patient identified by MRN, date of birth, ID band Patient awake    Reviewed: Allergy & Precautions, H&P , NPO status , Patient's Chart, lab work & pertinent test results  Airway       Dental   Pulmonary sleep apnea and Continuous Positive Airway Pressure Ventilation ,          Cardiovascular hypertension, Pt. on medications     Neuro/Psych negative neurological ROS  negative psych ROS   GI/Hepatic negative GI ROS, Neg liver ROS,   Endo/Other  Morbid obesity  Renal/GU negative Renal ROS  negative genitourinary   Musculoskeletal negative musculoskeletal ROS (+)   Abdominal   Peds negative pediatric ROS (+)  Hematology negative hematology ROS (+)   Anesthesia Other Findings   Reproductive/Obstetrics negative OB ROS                           Anesthesia Physical Anesthesia Plan  ASA: III  Anesthesia Plan: Spinal   Post-op Pain Management:    Induction:   Airway Management Planned: Simple Face Mask  Additional Equipment:   Intra-op Plan:   Post-operative Plan:   Informed Consent: I have reviewed the patients History and Physical, chart, labs and discussed the procedure including the risks, benefits and alternatives for the proposed anesthesia with the patient or authorized representative who has indicated his/her understanding and acceptance.   Dental advisory given  Plan Discussed with: CRNA  Anesthesia Plan Comments:         Anesthesia Quick Evaluation

## 2014-04-09 NOTE — H&P (View-Only) (Signed)
Melinda Kerr DOB: [birth year] Widowed / Language: Cleophus Molt / Race: White Female  Date of Admission:  04-09-2014  Chief Complaint:  Right Knee Pain  History of Present Illness The patient is a 79 year old female who comes in today for a preoperative History and Physical. The patient is scheduled for a right total knee arthroplasty to be performed by Dr. Dione Plover. Aluisio, MD at [facility] on 04-09-2014. The patient is a 79 year old female who presented with knee complaints. The patient was seen for left greater than right knee pain. She has previously underwent a Left Total Knee Replacement and has been doing very well with regards to the left knee.  The patient reports right knee symptoms including: pain, stiffness and soreness.  The patient describes the severity of the symptoms as moderate in severity.The patient feels that the symptoms are worsening. Prior to being seen by Dr. Wynelle Link, the patient was previously evaluated in this clinic (with Dr. Delilah Shan) 1 year(s) ago. Previous work-up for this problem has included knee x-rays. Past treatment for this problem has included intra-articular injection of corticosteroids (and visco supplementation). Symptoms are exacerbated by motion at the knee and weight bearing. Symptoms are relieved by rest.  She is now ready to proceed with the second side, the right knee. Risks and benefits of the surgery have been discussed with the patient and they elect to proceed with surgery.  There are on active contraindications to upcoming procedure such as ongoing infection or progressive neurological disease.   Problem List/Past Medical(Alezandrew L Saphia Vanderford, III PA-C; 03/13/2014 2:31 PM) Osteoarthritis, Knee (715.96) Aftercare following joint replacement surgery (V54.81  Z47.1) Knee Pain (719.46  M25.569) S/P total knee replacement (V43.65  Z96.659) Gout Osteoarthritis High blood pressure Hypothyroidism Osteoporosis Sleep Apnea. uses  CPAP   Allergies OXYBUTYNIN    Family History(Alezandrew L Cheryel Kyte, III PA-C; 03/13/2014 2:17 PM) Heart Disease. First Degree Relatives. mother and father Cerebrovascular Accident. father Hypertension. First Degree Relatives. mother and father    Social History(Alezandrew Monika Salk, III PA-C; 03/13/2014 2:29 PM) Tobacco use. Never smoker. never smoker Exercise. Exercises weekly; does other Current work status. retired Marital status. widowed Number of flights of stairs before winded. 1 Illicit drug use. no Living situation. live alone Drug/Alcohol Rehab (Currently). no Drug/Alcohol Rehab (Previously). no Children. 0 Alcohol use. never consumed alcohol Tobacco / smoke exposure. no Pain Contract. no Post-Surgical Plans. Plan for rehab [facility].    Medication History(Alezandrew L Lucca Greggs, III PA-C; 03/13/2014 2:17 PM) Tramadol (prn) Active. Tylenol (prn) Active. Melatonin Active. Vitamin B6 Active. Multivitamin Active. Aspirin 81mg  Active. Vitamin E Active. Vitamin C Active. Omeprazole (20MG  Capsule DR, Oral) Active. Ranitidine HCl (300MG  Capsule, Oral) Active. Loratadine (10MG  Tablet, Oral) Active. Ondansetron HCl (4MG  Tablet, Oral) Active. Cyclobenzaprine HCl (10MG  Tablet, Oral) Active. (prn) Lisinopril ( Oral) Specific dose unknown - Active. Slow Fe ( Oral) Specific dose unknown - Active. Synthroid (125MCG Tablet, Oral) Active. Fosamax Plus D (70-2800MG -UNIT Tablet, Oral) Active. Simvastatin ( Oral) Specific dose unknown - Active. Tolterodine Tartrate ER ( Oral) Specific dose unknown - Active. Furosemide (40MG  Tablet, Oral) Active. Citalopram Hydrobromide (20MG  Tablet, Oral) Active. Allopurinol (300MG  Tablet, Oral) Active. HydrOXYzine HCl ( Oral) Specific dose unknown - Active. Claritin (10MG  Tablet, Oral) Active.    Past Surgical History(Alezandrew L Jess Toney, III PA-C; 03/13/2014 2:32 PM) Gallbladder Surgery.  laporoscopic Tubal Ligation Total Knee Replacement - Left. Date: 10/09/2013.    Review of Systems(Alezandrew L Dany Harten, III PA-C; 03/13/2014 2:29 PM) General:Not  Present- Chills, Fever, Night Sweats, Fatigue, Weight Gain, Weight Loss and Memory Loss. Skin:Not Present- Hives, Itching, Rash, Eczema and Lesions. HEENT:Not Present- Tinnitus, Headache, Double Vision, Visual Loss, Hearing Loss and Dentures. Respiratory:Not Present- Shortness of breath with exertion, Shortness of breath at rest, Allergies, Coughing up blood and Chronic Cough. Cardiovascular:Not Present- Chest Pain, Racing/skipping heartbeats, Difficulty Breathing Lying Down, Murmur, Swelling and Palpitations. Gastrointestinal:Not Present- Bloody Stool, Heartburn, Abdominal Pain, Vomiting, Nausea, Constipation, Diarrhea, Difficulty Swallowing, Jaundice and Loss of appetitie. Female Genitourinary:Not Present- Blood in Urine, Urinary frequency, Weak urinary stream, Discharge, Flank Pain, Incontinence, Painful Urination, Urgency, Urinary Retention and Urinating at Night. Musculoskeletal:Present- Joint Pain. Not Present- Muscle Weakness, Muscle Pain, Joint Swelling, Back Pain, Morning Stiffness and Spasms. Neurological:Not Present- Tremor, Dizziness, Blackout spells, Paralysis, Difficulty with balance and Weakness. Psychiatric:Not Present- Insomnia.    Vitals BP: 108/62 (Sitting, Right Arm, Standard)   Physical Exam(Alezandrew L Jisela Merlino, III PA-C; 03/13/2014 2:38 PM) The physical exam findings are as follows:   General Mental Status - Alert, cooperative and good historian. General Appearance- pleasant. Not in acute distress. Orientation- Oriented X3. Build & Nutrition- Well nourished and Well developed.   Head and Neck Head- normocephalic, atraumatic . Neck Global Assessment- supple. no bruit auscultated on the right and no bruit auscultated on the left.   Eye Vision- Wears corrective lenses.  Pupil- Bilateral- Regular and Round. Motion- Bilateral- EOMI.   Chest and Lung Exam Auscultation: Breath sounds:- clear at anterior chest wall and - clear at posterior chest wall. Adventitious sounds:- No Adventitious sounds.   Cardiovascular Auscultation:Rhythm- Regular rate and rhythm. Heart Sounds- S1 WNL and S2 WNL. Murmurs & Other Heart Sounds:Auscultation of the heart reveals - No Murmurs.   Abdomen Inspection:Contour- Generalized moderate distention. Palpation/Percussion:Tenderness- Abdomen is non-tender to palpation. Rigidity (guarding)- Abdomen is soft. Auscultation:Auscultation of the abdomen reveals - Bowel sounds normal.   Female Genitourinary Not done, not pertinent to present illness  Musculoskeletal On exam, well developed female alert and oriented in no apparent distress. Right knee shows no effusion. There is some tenderness medial greater than lateral. There is no instability. The right knee range is 0-125. Slight crepitus on range of motion. Tender medial greater than lateral with no instability.  RADIOGRAPHS: AP and lateral of the right knee shows bone on bone arthritis in the medial and patellofemoral compartments.    Assessment & Plan(Alezandrew L Sebastion Jun, III PA-C; 03/13/2014 2:38 PM) Osteoarthritis, Knee (715.96) Impression: Right Knee  S/P Left total knee replacement (V43.65  Z96.659)   Plan is for a Right Total Knee Replacement by Dr. Wynelle Link.  Plan is to go to [facility]  PCP - Dr. Alyson Ingles  The patient does not have any contraindications and will receive TXA (tranexamic acid) prior to surgery.  Signed electronically by Ok Edwards, III PA-C

## 2014-04-10 LAB — CBC
HCT: 32.4 % — ABNORMAL LOW (ref 36.0–46.0)
Hemoglobin: 10.9 g/dL — ABNORMAL LOW (ref 12.0–15.0)
MCH: 31.1 pg (ref 26.0–34.0)
MCHC: 33.6 g/dL (ref 30.0–36.0)
MCV: 92.6 fL (ref 78.0–100.0)
PLATELETS: 214 10*3/uL (ref 150–400)
RBC: 3.5 MIL/uL — AB (ref 3.87–5.11)
RDW: 13.3 % (ref 11.5–15.5)
WBC: 10.9 10*3/uL — ABNORMAL HIGH (ref 4.0–10.5)

## 2014-04-10 LAB — BASIC METABOLIC PANEL
ANION GAP: 9 (ref 5–15)
BUN: 15 mg/dL (ref 6–23)
CO2: 26 meq/L (ref 19–32)
Calcium: 8.8 mg/dL (ref 8.4–10.5)
Chloride: 102 mEq/L (ref 96–112)
Creatinine, Ser: 0.78 mg/dL (ref 0.50–1.10)
GFR calc Af Amer: >90 mL/min (ref >90)
GFR, EST NON AFRICAN AMERICAN: 79 mL/min — AB (ref >90)
Glucose, Bld: 164 mg/dL — ABNORMAL HIGH (ref 70–99)
Potassium: 4.8 mEq/L (ref 3.7–5.3)
SODIUM: 137 meq/L (ref 137–147)

## 2014-04-10 NOTE — Progress Notes (Signed)
   Subjective: 1 Day Post-Op Procedure(s) (LRB): RIGHT TOTAL KNEE ARTHROPLASTY (Right) Patient reports pain as mild.   Patient seen in rounds with Dr. Wynelle Link.  She states that she slept well last night. Patient is well, and has had no acute complaints or problems We will start therapy today.  Plan is to go [facility] after hospital stay.  Objective: Vital signs in last 24 hours: Temp:  [97.2 F (36.2 C)-98 F (36.7 C)] 97.6 F (36.4 C) (07/14 0530) Pulse Rate:  [64-77] 72 (07/14 0530) Resp:  [12-20] 16 (07/14 0800) BP: (97-131)/(59-93) 131/93 mmHg (07/14 0530) SpO2:  [94 %-100 %] 100 % (07/14 0800)  Intake/Output from previous day:  Intake/Output Summary (Last 24 hours) at 04/10/14 0848 Last data filed at 04/10/14 0735  Gross per 24 hour  Intake   3895 ml  Output   4300 ml  Net   -405 ml    Intake/Output this shift: Total I/O In: -  Out: 250 [Urine:250]  Labs:  Recent Labs  04/10/14 0428  HGB 10.9*    Recent Labs  04/10/14 0428  WBC 10.9*  RBC 3.50*  HCT 32.4*  PLT 214    Recent Labs  04/10/14 0428  NA 137  K 4.8  CL 102  CO2 26  BUN 15  CREATININE 0.78  GLUCOSE 164*  CALCIUM 8.8   No results found for this basename: LABPT, INR,  in the last 72 hours  EXAM General - Patient is Alert, Appropriate and Oriented Extremity - Neurovascular intact Sensation intact distally Dorsiflexion/Plantar flexion intact Dressing - dressing C/D/I Motor Function - intact, moving foot and toes well on exam.  Hemovac pulled without difficulty.  Past Medical History  Diagnosis Date  . Hyperlipidemia     under control  . Depression   . OSA on CPAP   . Spondylosis     with scoliosis  . Phlebitis     hx of  . Esophageal reflux   . Morbid obesity with BMI of 40.0-44.9, adult   . Hypertension   . Osteoarthritis   . Prediabetes     under control with diet    Assessment/Plan: 1 Day Post-Op Procedure(s) (LRB): RIGHT TOTAL KNEE ARTHROPLASTY  (Right) Active Problems:   OA (osteoarthritis) of knee  Estimated body mass index is 44.53 kg/(m^2) as calculated from the following:   Height as of this encounter: 5' (1.524 m).   Weight as of this encounter: 103.42 kg (228 lb). Advance diet Up with therapy Discharge to SNF  DVT Prophylaxis - Xarelto Weight-Bearing as tolerated to right leg D/C O2 and Pulse OX and try on Room Air  Arlee Muslim, PA-C Orthopaedic Surgery 04/10/2014, 8:48 AM

## 2014-04-10 NOTE — Progress Notes (Signed)
Physical Therapy Treatment Patient Details Name: Melinda Kerr MRN: [id number] DOB: 11-May-1935 Today's Date: 04/10/2014    History of Present Illness R TKA    PT Comments    Pt progressing although slowly; will benefit from SNF  Follow Up Recommendations  SNF     Equipment Recommendations  None recommended by PT    Recommendations for Other Services       Precautions / Restrictions Precautions Precautions: Fall;Knee Required Braces or Orthoses: Knee Immobilizer - Right Knee Immobilizer - Right: Discontinue once straight leg raise with < 10 degree lag Restrictions Weight Bearing Restrictions: No Other Position/Activity Restrictions: WBAT    Mobility  Bed Mobility               General bed mobility comments: to EOB with nursing  Transfers Overall transfer level: Needs assistance Equipment used: Rolling walker (2 wheeled) Transfers: Sit to/from [facility] Sit to Stand: Mod assist Stand pivot transfers: Mod assist;+2 safety/equipment       General transfer comment: assist for anterior-superior wt shift, cues for hand placement and safety  Ambulation/Gait Ambulation/Gait assistance: +2 safety/equipment;Mod assist Ambulation Distance (Feet): 4 Feet Assistive device: Rolling walker (2 wheeled) Gait Pattern/deviations: Step-to pattern;Trunk flexed;Decreased stance time - right;Decreased step length - right;Decreased step length - left     General Gait Details: multi-modal cues for safety, RW position, and sequence   Stairs            Wheelchair Mobility    Modified Rankin (Stroke Patients Only)       Balance             Standing balance-Leahy Scale: Poor                      Cognition Arousal/Alertness: Awake/alert Behavior During Therapy: WFL for tasks assessed/performed Overall Cognitive Status: Within Functional Limits for tasks assessed                      Exercises Total Joint  Exercises Ankle Circles/Pumps: AROM;Both;10 reps;Supine Quad Sets: AROM;Right;10 reps Heel Slides: AAROM;Right;10 reps Hip ABduction/ADduction: AAROM;Right;10 reps Straight Leg Raises: AAROM;Right;10 reps    General Comments General comments (skin integrity, edema, etc.): pt set up for brushing teeth      Pertinent Vitals/Pain 4/10 pain right knee, was premedicated, ice to knee    Home Living                      Prior Function            PT Goals (current goals can now be found in the care plan section) Acute Rehab PT Goals Patient Stated Goal: to go to [facility] with  friend when knees are better PT Goal Formulation: With patient Time For Goal Achievement: 04/23/14 Potential to Achieve Goals: Good Progress towards PT goals: Progressing toward goals    Frequency  7X/week    PT Plan Current plan remains appropriate    Co-evaluation             End of Session Equipment Utilized During Treatment: Gait belt Activity Tolerance: Patient tolerated treatment well Patient left: in chair;with call bell/phone within reach     Time: 1105-1133 PT Time Calculation (min): 28 min  Charges:  $Gait Training: 8-22 mins $Therapeutic Exercise: 8-22 mins                    G Codes:  [facility] 04/10/2014, 11:39 AM

## 2014-04-10 NOTE — Progress Notes (Signed)
04/10/14 1400  PT Visit Information  Last PT Received On 04/10/14  Assistance Needed +2  History of Present Illness R TKA  PT Time Calculation  PT Start Time 1405  PT Stop Time 1405  PT Time Calculation (min) 0 min  Precautions  Precautions Fall;Knee  Required Braces or Orthoses Knee Immobilizer - Right  Knee Immobilizer - Right Discontinue once straight leg raise with < 10 degree lag  Restrictions  Other Position/Activity Restrictions WBAT  Cognition  Arousal/Alertness Awake/alert  Behavior During Therapy WFL for tasks assessed/performed  Overall Cognitive Status Within Functional Limits for tasks assessed  Bed Mobility  Overal bed mobility Needs Assistance  Bed Mobility Sit to Supine  Sit to supine Min assist  General bed mobility comments assist with RLE and cues for safety  Transfers  Overall transfer level Needs assistance  Equipment used Rolling walker (2 wheeled)  Transfers Sit to/from Stand  Sit to Stand Mod assist  Stand pivot transfers Mod assist;+2 safety/equipment  General transfer comment assist for anterior-superior wt shift, cues for hand placement and safety  Ambulation/Gait  Ambulation/Gait assistance Mod assist;+2 safety/equipment  Ambulation Distance (Feet) 5 Feet  Assistive device Rolling walker (2 wheeled)  Gait Pattern/deviations Step-to pattern  General Gait Details multi-modal cues for safety, RW position, and sequence; pt is extremely unsafe, grabbing onto things other than walker, attempting to sit too soon, etc  Balance  Standing balance-Leahy Scale Poor  Total Joint Exercises  Ankle Circles/Pumps AROM;Both;10 reps;Supine  Quad Sets AROM;Right;10 reps  Heel Slides AAROM;Right;10 reps  PT - End of Session  Equipment Utilized During Treatment Gait belt;Right knee immobilizer  Activity Tolerance Patient limited by lethargy;Patient limited by pain  Patient left in bed;with call bell/phone within reach  Nurse Communication Mobility status  PT -  Assessment/Plan  PT Plan Current plan remains appropriate  PT Frequency 7X/week  Follow Up Recommendations SNF  PT equipment None recommended by PT  PT Goal Progression  Progress towards PT goals Progressing toward goals  Acute Rehab PT Goals  PT Goal Formulation With patient  Time For Goal Achievement 04/23/14  Potential to Achieve Goals Good  PT General Charges  $$ ACUTE PT VISIT 1 Procedure  PT Treatments  $Therapeutic Activity 8-22 mins

## 2014-04-10 NOTE — Discharge Instructions (Addendum)
° °Dr. Frank Aluisio °Total Joint Specialist °[facility] °[address] °[place], [place] [address] °[phone] ° °TOTAL KNEE REPLACEMENT POSTOPERATIVE DIRECTIONS ° ° ° °Knee Rehabilitation, Guidelines Following Surgery  °Results after knee surgery are often greatly improved when you follow the exercise, range of motion and muscle strengthening exercises prescribed by your doctor. Safety measures are also important to protect the knee from further injury. Any time any of these exercises cause you to have increased pain or swelling in your knee joint, decrease the amount until you are comfortable again and slowly increase them. If you have problems or questions, call your caregiver or physical therapist for advice.  ° °HOME CARE INSTRUCTIONS  °Remove items at home which could result in a fall. This includes throw rugs or furniture in walking pathways.  °Continue medications as instructed at time of discharge. °You may have some home medications which will be placed on hold until you complete the course of blood thinner medication.  °You may start showering once you are discharged home but do not submerge the incision under water. Just pat the incision dry and apply a dry gauze dressing on daily. °Walk with walker as instructed.  °You may resume a sexual relationship in one month or when given the OK by  your doctor.  °· Use walker as long as suggested by your caregivers. °· Avoid periods of inactivity such as sitting longer than an hour when not asleep. This helps prevent blood clots.  °You may put full weight on your legs and walk as much as is comfortable.  °You may return to work once you are cleared by your doctor.  °Do not drive a car for 6 weeks or until released by you surgeon.  °· Do not drive while taking narcotics.  °Wear the elastic stockings for three weeks following surgery during the day but you may remove then at night. °Make sure you keep all of your appointments after your  operation with all of your doctors and caregivers. You should call the office at the above phone number and make an appointment for approximately two weeks after the date of your surgery. °Change the dressing daily and reapply a dry dressing each time. °Please pick up a stool softener and laxative for home use as long as you are requiring pain medications. °· Continue to use ice on the knee for pain and swelling from surgery. You may notice swelling that will progress down to the foot and ankle.  This is normal after surgery.  Elevate the leg when you are not up walking on it.   °It is important for you to complete the blood thinner medication as prescribed by your doctor. °· Continue to use the breathing machine which will help keep your temperature down.  It is common for your temperature to cycle up and down following surgery, especially at night when you are not up moving around and exerting yourself.  The breathing machine keeps your lungs expanded and your temperature down. ° °RANGE OF MOTION AND STRENGTHENING EXERCISES  °Rehabilitation of the knee is important following a knee injury or an operation. After just a few days of immobilization, the muscles of the thigh which control the knee become weakened and shrink (atrophy). Knee exercises are designed to build up the tone and strength of the thigh muscles and to improve knee motion. Often times heat used for twenty to thirty minutes before working out will loosen up your tissues and help with improving the   range of motion but do not use heat for the first two weeks following surgery. These exercises can be done on a training (exercise) mat, on the floor, on a table or on a bed. Use what ever works the best and is most comfortable for you Knee exercises include:  Leg Lifts - While your knee is still immobilized in a splint or cast, you can do straight leg raises. Lift the leg to 60 degrees, hold for 3 sec, and slowly lower the leg. Repeat 10-20 times 2-3  times daily. Perform this exercise against resistance later as your knee gets better.  Quad and Hamstring Sets - Tighten up the muscle on the front of the thigh (Quad) and hold for 5-10 sec. Repeat this 10-20 times hourly. Hamstring sets are done by pushing the foot backward against an object and holding for 5-10 sec. Repeat as with quad sets.  A rehabilitation program following serious knee injuries can speed recovery and prevent re-injury in the future due to weakened muscles. Contact your doctor or a physical therapist for more information on knee rehabilitation.   SKILLED REHAB INSTRUCTIONS: If the patient is transferred to a skilled rehab facility following release from the hospital, a list of the current medications will be sent to the facility for the patient to continue.  When discharged from the skilled rehab facility, please have the facility set up the patient's [place] prior to being released. Also, the skilled facility will be responsible for providing the patient with their medications at time of release from the facility to include their pain medication, the muscle relaxants, and their blood thinner medication. If the patient is still at the rehab facility at time of the two week follow up appointment, the skilled rehab facility will also need to assist the patient in arranging follow up appointment in our office and any transportation needs.  MAKE SURE YOU:  Understand these instructions.  Will watch your condition.  Will get help right away if you are not doing well or get worse.    Pick up stool softner and laxative for home. Do not submerge incision under water. May shower. Continue to use ice for pain and swelling from surgery.  Take Xarelto for two and a half more weeks, then discontinue Xarelto. Once the patient has completed the Xarelto, they may resume the 81 mg Aspirin.    Information on my medicine - XARELTO (Rivaroxaban)  This medication  education was reviewed with me or my healthcare representative as part of my discharge preparation.  The pharmacist that spoke with me during my hospital stay was:  Julio Sicks, [facility]  Why was Xarelto prescribed for you? Xarelto was prescribed for you to reduce the risk of blood clots forming after orthopedic surgery. The medical term for these abnormal blood clots is venous thromboembolism (VTE).  What do you need to know about xarelto ? Take your Xarelto ONCE DAILY at the same time every day. You may take it either with or without food.  If you have difficulty swallowing the tablet whole, you may crush it and mix in applesauce just prior to taking your dose.  Take Xarelto exactly as prescribed by your doctor and DO NOT stop taking Xarelto without talking to the doctor who prescribed the medication.  Stopping without other VTE prevention medication to take the place of Xarelto may increase your risk of developing a clot.  After discharge, you should have regular check-up appointments with your healthcare provider that  is prescribing your Xarelto.    What do you do if you miss a dose? If you miss a dose, take it as soon as you remember on the same day then continue your regularly scheduled once daily regimen the next day. Do not take two doses of Xarelto on the same day.   Important Safety Information A possible side effect of Xarelto is bleeding. You should call your healthcare provider right away if you experience any of the following:   Bleeding from an injury or your nose that does not stop.   Unusual colored urine (red or dark brown) or unusual colored stools (red or black).   Unusual bruising for unknown reasons.   A serious fall or if you hit your head (even if there is no bleeding).  Some medicines may interact with Xarelto and might increase your risk of bleeding while on Xarelto. To help avoid this, consult your healthcare provider or pharmacist prior to using any new  prescription or non-prescription medications, including herbals, vitamins, non-steroidal anti-inflammatory drugs (NSAIDs) and supplements.  This website has more information on Xarelto: [email or web address]

## 2014-04-10 NOTE — Progress Notes (Signed)
OT Cancellation Note  Patient Details Name: Melinda Kerr MRN: [id number] DOB: 04-14-1935   Cancelled Treatment:    Reason Eval/Treat Not Completed: Other (comment) Note plans for SNF. Will defer OT eval to SNF.  Jules Schick [phone] 04/10/2014, 8:24 AM

## 2014-04-11 ENCOUNTER — Encounter (HOSPITAL_COMMUNITY): Payer: Self-pay | Admitting: *Deleted

## 2014-04-11 LAB — BASIC METABOLIC PANEL
ANION GAP: 9 (ref 5–15)
BUN: 17 mg/dL (ref 6–23)
CHLORIDE: 101 meq/L (ref 96–112)
CO2: 28 mEq/L (ref 19–32)
Calcium: 9.4 mg/dL (ref 8.4–10.5)
Creatinine, Ser: 0.74 mg/dL (ref 0.50–1.10)
GFR calc non Af Amer: 80 mL/min — ABNORMAL LOW (ref >90)
Glucose, Bld: 134 mg/dL — ABNORMAL HIGH (ref 70–99)
POTASSIUM: 4.8 meq/L (ref 3.7–5.3)
SODIUM: 138 meq/L (ref 137–147)

## 2014-04-11 LAB — CBC
HCT: 33.6 % — ABNORMAL LOW (ref 36.0–46.0)
Hemoglobin: 11.3 g/dL — ABNORMAL LOW (ref 12.0–15.0)
MCH: 30.8 pg (ref 26.0–34.0)
MCHC: 33.6 g/dL (ref 30.0–36.0)
MCV: 91.6 fL (ref 78.0–100.0)
PLATELETS: 233 10*3/uL (ref 150–400)
RBC: 3.67 MIL/uL — ABNORMAL LOW (ref 3.87–5.11)
RDW: 13.3 % (ref 11.5–15.5)
WBC: 13.5 10*3/uL — AB (ref 4.0–10.5)

## 2014-04-11 MED ORDER — ALUM & MAG HYDROXIDE-SIMETH 200-200-20 MG/5ML PO SUSP
30.0000 mL | ORAL | Status: DC | PRN
  Administered 2014-04-11: 30 mL via ORAL
  Filled 2014-04-11: qty 30

## 2014-04-11 NOTE — Progress Notes (Signed)
RT placed patient on CPAP. Setting is 8 cmH2O. Water chamber filled with sterile water for humidification. Patient tolerating well. RT to monitor as needed.

## 2014-04-11 NOTE — Progress Notes (Signed)
RT placed patient on CPAP with a setting of 8 cmH2O. Water chamber filled with sterile water for humidification. Patient is tolerating well. RT to monitor as needed.

## 2014-04-11 NOTE — Progress Notes (Signed)
Physical Therapy Treatment Patient Details Name: Melinda Kerr MRN: [id number] DOB: 08/24/1935 Today's Date: 04/11/2014    History of Present Illness R TKA    PT Comments    POD # 2 am session.  Assisted pt OOB to amb limited distance.  Required + 2 assit for safety. Performed some TKR TE's followed by ICE.  Follow Up Recommendations  SNF     Equipment Recommendations  None recommended by PT    Recommendations for Other Services       Precautions / Restrictions Precautions Precautions: Fall;Knee Required Braces or Orthoses: Knee Immobilizer - Right Knee Immobilizer - Right: Discontinue once straight leg raise with < 10 degree lag Restrictions Weight Bearing Restrictions: No Other Position/Activity Restrictions: WBAT    Mobility  Bed Mobility Overal bed mobility: Needs Assistance Bed Mobility: Supine to Sit     Supine to sit: Mod assist     General bed mobility comments: assist with RLE and cues for safety  Transfers Overall transfer level: Needs assistance Equipment used: Rolling walker (2 wheeled) Transfers: Sit to/from Stand Sit to Stand: +2 physical assistance;Mod assist         General transfer comment: 50% VC's on proper tech and hand placement  Ambulation/Gait Ambulation/Gait assistance: Mod assist;+2 physical assistance Ambulation Distance (Feet): 12 Feet Assistive device: Rolling walker (2 wheeled) Gait Pattern/deviations: Step-to pattern Gait velocity: decreased   General Gait Details: 75% VC's on proper safe tech and sequencing.  Also MAX encouragement to increase amb distance.  Pt demon increased fear/anxiety and requires positive reinforcement.  Second person following with recliner.   Stairs            Wheelchair Mobility    Modified Rankin (Stroke Patients Only)       Balance                                    Cognition                            Exercises   Total Knee Replacement TE's 10  reps B LE ankle pumps 10 reps towel squeezes 10 reps knee presses 10 reps heel slides  10 reps SAQ's 10 reps SLR's 10 reps ABD Followed by ICE     General Comments        Pertinent Vitals/Pain C/o 8/10 Pre medicated ICE applied    Home Living                      Prior Function            PT Goals (current goals can now be found in the care plan section) Progress towards PT goals: Progressing toward goals    Frequency  7X/week    PT Plan      Co-evaluation             End of Session Equipment Utilized During Treatment: Gait belt;Right knee immobilizer         Time: 5732-2025 PT Time Calculation (min): 28 min  Charges:  $Gait Training: 8-22 mins $Therapeutic Exercise: 8-22 mins                    G Codes:      Rica Koyanagi  PTA [facility]  Acute  Rehab Pager      [phone]

## 2014-04-11 NOTE — Progress Notes (Signed)
Physical Therapy Treatment Patient Details Name: Melinda Kerr MRN: [id number] DOB: [birth year] Today's Date: 04/11/2014    History of Present Illness R TKA    PT Comments    POD # 2 pm session.  Amb in hallway second time, assist on/off BSC then back to bed.  Pt progressing slowly and plans to D/C to SNF for ST rehab.  Follow Up Recommendations  SNF     Equipment Recommendations  None recommended by PT    Recommendations for Other Services       Precautions / Restrictions Precautions Precautions: Fall;Knee Required Braces or Orthoses: Knee Immobilizer - Right Knee Immobilizer - Right: Discontinue once straight leg raise with < 10 degree lag Restrictions Weight Bearing Restrictions: No Other Position/Activity Restrictions: WBAT    Mobility  Bed Mobility Overal bed mobility: Needs Assistance Bed Mobility: Sit to Supine     Supine to sit: Mod assist Sit to supine: +2 for physical assistance;Max assist   General bed mobility comments: increased assist back to bed  Transfers Overall transfer level: Needs assistance Equipment used: Rolling walker (2 wheeled) Transfers: Sit to/from Stand Sit to Stand: +2 physical assistance;Mod assist         General transfer comment: 50% VC's on proper tech and hand placement  Ambulation/Gait Ambulation/Gait assistance: Mod assist;+2 physical assistance Ambulation Distance (Feet): 15 Feet Assistive device: Rolling walker (2 wheeled) Gait Pattern/deviations: Step-to pattern Gait velocity: decreased   General Gait Details: 50% VC's on proper walker to self distance and increased, increased time to complete distance.   Stairs            Wheelchair Mobility    Modified Rankin (Stroke Patients Only)       Balance                                    Cognition                            Exercises      General Comments        Pertinent Vitals/Pain C/o 5/10 ICE applied    Home  Living                      Prior Function            PT Goals (current goals can now be found in the care plan section) Progress towards PT goals: Progressing toward goals    Frequency  7X/week    PT Plan      Co-evaluation             End of Session Equipment Utilized During Treatment: Gait belt;Right knee immobilizer Activity Tolerance: Patient limited by fatigue;Patient limited by pain Patient left: in bed;with call bell/phone within reach     Time: 1418-1444 PT Time Calculation (min): 26 min  Charges:  $Gait Training: 8-22 mins $Therapeutic Activity: 8-22 mins                    G Codes:      Rica Koyanagi  PTA [facility]  Acute  Rehab Pager      [phone]

## 2014-04-12 DIAGNOSIS — R262 Difficulty in walking, not elsewhere classified: Secondary | ICD-10-CM | POA: Diagnosis not present

## 2014-04-12 DIAGNOSIS — Z96659 Presence of unspecified artificial knee joint: Secondary | ICD-10-CM | POA: Diagnosis not present

## 2014-04-12 DIAGNOSIS — M109 Gout, unspecified: Secondary | ICD-10-CM | POA: Diagnosis not present

## 2014-04-12 DIAGNOSIS — M6281 Muscle weakness (generalized): Secondary | ICD-10-CM | POA: Diagnosis not present

## 2014-04-12 DIAGNOSIS — E785 Hyperlipidemia, unspecified: Secondary | ICD-10-CM | POA: Diagnosis not present

## 2014-04-12 DIAGNOSIS — D62 Acute posthemorrhagic anemia: Secondary | ICD-10-CM | POA: Diagnosis not present

## 2014-04-12 DIAGNOSIS — F3289 Other specified depressive episodes: Secondary | ICD-10-CM | POA: Diagnosis not present

## 2014-04-12 DIAGNOSIS — N390 Urinary tract infection, site not specified: Secondary | ICD-10-CM | POA: Diagnosis not present

## 2014-04-12 DIAGNOSIS — E038 Other specified hypothyroidism: Secondary | ICD-10-CM | POA: Diagnosis not present

## 2014-04-12 DIAGNOSIS — S8990XA Unspecified injury of unspecified lower leg, initial encounter: Secondary | ICD-10-CM | POA: Diagnosis not present

## 2014-04-12 DIAGNOSIS — Z471 Aftercare following joint replacement surgery: Secondary | ICD-10-CM | POA: Diagnosis not present

## 2014-04-12 DIAGNOSIS — K5909 Other constipation: Secondary | ICD-10-CM | POA: Diagnosis not present

## 2014-04-12 DIAGNOSIS — R279 Unspecified lack of coordination: Secondary | ICD-10-CM | POA: Diagnosis not present

## 2014-04-12 DIAGNOSIS — M25569 Pain in unspecified knee: Secondary | ICD-10-CM | POA: Diagnosis not present

## 2014-04-12 DIAGNOSIS — I10 Essential (primary) hypertension: Secondary | ICD-10-CM | POA: Diagnosis not present

## 2014-04-12 DIAGNOSIS — D649 Anemia, unspecified: Secondary | ICD-10-CM | POA: Diagnosis not present

## 2014-04-12 DIAGNOSIS — M1710 Unilateral primary osteoarthritis, unspecified knee: Secondary | ICD-10-CM | POA: Diagnosis not present

## 2014-04-12 DIAGNOSIS — E039 Hypothyroidism, unspecified: Secondary | ICD-10-CM | POA: Diagnosis not present

## 2014-04-12 DIAGNOSIS — K219 Gastro-esophageal reflux disease without esophagitis: Secondary | ICD-10-CM | POA: Diagnosis not present

## 2014-04-12 DIAGNOSIS — F329 Major depressive disorder, single episode, unspecified: Secondary | ICD-10-CM | POA: Diagnosis not present

## 2014-04-12 DIAGNOSIS — G4733 Obstructive sleep apnea (adult) (pediatric): Secondary | ICD-10-CM | POA: Diagnosis not present

## 2014-04-12 LAB — CBC
HCT: 32.3 % — ABNORMAL LOW (ref 36.0–46.0)
Hemoglobin: 10.8 g/dL — ABNORMAL LOW (ref 12.0–15.0)
MCH: 31.1 pg (ref 26.0–34.0)
MCHC: 33.4 g/dL (ref 30.0–36.0)
MCV: 93.1 fL (ref 78.0–100.0)
Platelets: 245 10*3/uL (ref 150–400)
RBC: 3.47 MIL/uL — AB (ref 3.87–5.11)
RDW: 13.8 % (ref 11.5–15.5)
WBC: 11.9 10*3/uL — ABNORMAL HIGH (ref 4.0–10.5)

## 2014-04-12 MED ORDER — OXYCODONE HCL 5 MG PO TABS: 5.0000 mg | ORAL_TABLET | ORAL | Status: DC | PRN

## 2014-04-12 MED ORDER — RIVAROXABAN 10 MG PO TABS: 10.0000 mg | ORAL_TABLET | Freq: Every day | ORAL | Status: DC

## 2014-04-12 MED ORDER — POLYETHYLENE GLYCOL 3350 17 G PO PACK: 17.0000 g | PACK | Freq: Every day | ORAL | Status: DC | PRN

## 2014-04-12 MED ORDER — ALUM & MAG HYDROXIDE-SIMETH 200-200-20 MG/5ML PO SUSP: 30.0000 mL | ORAL | Status: DC | PRN

## 2014-04-12 MED ORDER — BISACODYL 10 MG RE SUPP: 10.0000 mg | Freq: Every day | RECTAL | Status: DC | PRN

## 2014-04-12 MED ORDER — DSS 100 MG PO CAPS: 100.0000 mg | ORAL_CAPSULE | Freq: Two times a day (BID) | ORAL | Status: DC

## 2014-04-12 MED ORDER — METHOCARBAMOL 500 MG PO TABS: 500.0000 mg | ORAL_TABLET | Freq: Four times a day (QID) | ORAL | Status: DC | PRN

## 2014-04-12 MED ORDER — ONDANSETRON HCL 4 MG PO TABS: 4.0000 mg | ORAL_TABLET | Freq: Four times a day (QID) | ORAL | Status: DC | PRN

## 2014-04-12 MED ORDER — TRAMADOL HCL 50 MG PO TABS: 50.0000 mg | ORAL_TABLET | Freq: Four times a day (QID) | ORAL | Status: DC | PRN

## 2014-04-12 NOTE — Progress Notes (Signed)
Physical Therapy Treatment Patient Details Name: Melinda Kerr MRN: [id number] DOB: 06/05/1935 Today's Date: 04/12/2014    History of Present Illness R TKA    PT Comments    POD # 3 am session.  Assisted OOB to amb to bathroom required + 2 assist for safety and increased time.  Left pt in bathroom with NT.  Follow Up Recommendations  SNF     Equipment Recommendations       Recommendations for Other Services       Precautions / Restrictions Precautions Precautions: Fall;Knee Required Braces or Orthoses: Knee Immobilizer - Right Knee Immobilizer - Right: Discontinue once straight leg raise with < 10 degree lag Restrictions Weight Bearing Restrictions: No Other Position/Activity Restrictions: WBAT    Mobility  Bed Mobility Overal bed mobility: Needs Assistance Bed Mobility: Supine to Sit     Supine to sit: Mod assist;Independent     General bed mobility comments: increased time and support R LE.      Transfers Overall transfer level: Needs assistance Equipment used: Rolling walker (2 wheeled) Transfers: Sit to/from Stand Sit to Stand: +2 physical assistance;Mod assist         General transfer comment: 50% VC's on proper tech and hand placement plus increased, increased time.    Ambulation/Gait Ambulation/Gait assistance: +2 physical assistance;Mod assist Ambulation Distance (Feet): 12 Feet Assistive device: Rolling walker (2 wheeled)   Gait velocity: decreased   General Gait Details: 50% VC's on proper walker to self distance and increased, increased time to complete distance.  amb from bed to bathroom.   Stairs            Wheelchair Mobility    Modified Rankin (Stroke Patients Only)       Balance                                    Cognition                            Exercises      General Comments        Pertinent Vitals/Pain C/o 5/10 knee pain Pre medicated    Home Living                      Prior Function            PT Goals (current goals can now be found in the care plan section) Progress towards PT goals: Progressing toward goals    Frequency       PT Plan      Co-evaluation             End of Session Equipment Utilized During Treatment: Gait belt;Right knee immobilizer Activity Tolerance: Patient limited by fatigue;Patient limited by pain Patient left: Other (comment) (in bathrrom)     Time: 1037-1050 PT Time Calculation (min): 13 min  Charges:  $Gait Training: 8-22 mins                    G Codes:      Rica Koyanagi  PTA [facility]  Acute  Rehab Pager      [phone]

## 2014-04-12 NOTE — Progress Notes (Signed)
   Subjective: 3 Days Post-Op Procedure(s) (LRB): RIGHT TOTAL KNEE ARTHROPLASTY (Right) Patient reports pain as mild.   Patient seen in rounds with Dr. Wynelle Link. Patient is well, but has had some minor complaints of pain in the knee, requiring pain medications Patient is ready to go to [facility] today.  Objective: Vital signs in last 24 hours: Temp:  [98.1 F (36.7 C)-98.7 F (37.1 C)] 98.1 F (36.7 C) (07/16 0430) Pulse Rate:  [73-81] 80 (07/16 0430) Resp:  [14-20] 20 (07/16 0430) BP: (135-140)/(40-75) 135/40 mmHg (07/16 0430) SpO2:  [94 %-98 %] 96 % (07/16 0430)  Intake/Output from previous day:  Intake/Output Summary (Last 24 hours) at 04/12/14 0748 Last data filed at 04/12/14 0430  Gross per 24 hour  Intake   1200 ml  Output   1350 ml  Net   -150 ml     Labs:  Recent Labs  04/10/14 0428 04/11/14 0546 04/12/14 0436  HGB 10.9* 11.3* 10.8*    Recent Labs  04/11/14 0546 04/12/14 0436  WBC 13.5* 11.9*  RBC 3.67* 3.47*  HCT 33.6* 32.3*  PLT 233 245    Recent Labs  04/10/14 0428 04/11/14 0546  NA 137 138  K 4.8 4.8  CL 102 101  CO2 26 28  BUN 15 17  CREATININE 0.78 0.74  GLUCOSE 164* 134*  CALCIUM 8.8 9.4   No results found for this basename: LABPT, INR,  in the last 72 hours  EXAM: General - Patient is Alert, Appropriate and Oriented Extremity - Neurovascular intact Sensation intact distally Dorsiflexion/Plantar flexion intact Incision - clean, dry, no drainage, healing Motor Function - intact, moving foot and toes well on exam.   Assessment/Plan: 3 Days Post-Op Procedure(s) (LRB): RIGHT TOTAL KNEE ARTHROPLASTY (Right) Procedure(s) (LRB): RIGHT TOTAL KNEE ARTHROPLASTY (Right) Past Medical History  Diagnosis Date  . Hyperlipidemia     under control  . Depression   . OSA on CPAP   . Spondylosis     with scoliosis  . Phlebitis     hx of  . Esophageal reflux   . Morbid obesity with BMI of 40.0-44.9, adult   . Hypertension   .  Osteoarthritis   . Prediabetes     under control with diet   Active Problems:   OSA on CPAP   Morbid obesity with BMI of 40.0-44.9, adult   Hypertension   OA (osteoarthritis) of knee   Hypothyroidism   UI (urinary incontinence)   Dyslipidemia   Constipation   Gout   Anemia   Insomnia   Depression   Postoperative anemia due to acute blood loss  Estimated body mass index is 44.53 kg/(m^2) as calculated from the following:   Height as of this encounter: 5' (1.524 m).   Weight as of this encounter: 103.42 kg (228 lb). Up with therapy Discharge to SNF - [facility] Diet - Cardiac diet Follow up - in 2 weeks Activity - WBAT Disposition - [facility] Condition Upon Discharge - Good D/C Meds - See DC Summary DVT Prophylaxis - Xarelto  Arlee Muslim, PA-C Orthopaedic Surgery 04/12/2014, 7:48 AM

## 2014-04-12 NOTE — Discharge Summary (Signed)
Physician Discharge Summary   Patient ID: Melinda Kerr MRN: [id number] DOB/AGE: 05-23-1935 79 y.o.  Admit date: 04/09/2014 Discharge date: 04/12/2014  Primary Diagnosis:  Osteoarthritis Right knee(s)  Admission Diagnoses:  Past Medical History  Diagnosis Date  . Hyperlipidemia     under control  . Depression   . OSA on CPAP   . Spondylosis     with scoliosis  . Phlebitis     hx of  . Esophageal reflux   . Morbid obesity with BMI of 40.0-44.9, adult   . Hypertension   . Osteoarthritis   . Prediabetes     under control with diet   Discharge Diagnoses:   Active Problems:   OSA on CPAP   Morbid obesity with BMI of 40.0-44.9, adult   Hypertension   OA (osteoarthritis) of knee   Hypothyroidism   UI (urinary incontinence)   Dyslipidemia   Constipation   Gout   Anemia   Insomnia   Depression   Postoperative anemia due to acute blood loss  Estimated body mass index is 44.53 kg/(m^2) as calculated from the following:   Height as of this encounter: 5' (1.524 m).   Weight as of this encounter: 103.42 kg (228 lb).  Procedure:  Procedure(s) (LRB): RIGHT TOTAL KNEE ARTHROPLASTY (Right)   Consults: None  HPI: Melinda Kerr is a 79 y.o. year old female with end stage OA of her right knee with progressively worsening pain and dysfunction. She has constant pain, with activity and at rest and significant functional deficits with difficulties even with ADLs. She has had extensive non-op management including analgesics, injections of cortisone and viscosupplements, and home exercise program, but remains in significant pain with significant dysfunction.Radiographs show bone on bone arthritis medial and patellofemoral. She presents now for right Total Knee Arthroplasty.   Laboratory Data: Admission on 04/09/2014  Component Date Value Ref Range Status  . ABO/RH(D) 04/09/2014 A NEG   Final  . Antibody Screen 04/09/2014 NEG   Final  . Sample Expiration 04/09/2014 04/12/2014    Final  . WBC 04/10/2014 10.9* 4.0 - 10.5 K/uL Final  . RBC 04/10/2014 3.50* 3.87 - 5.11 MIL/uL Final  . Hemoglobin 04/10/2014 10.9* 12.0 - 15.0 g/dL Final  . HCT 04/10/2014 32.4* 36.0 - 46.0 % Final  . MCV 04/10/2014 92.6  78.0 - 100.0 fL Final  . MCH 04/10/2014 31.1  26.0 - 34.0 pg Final  . MCHC 04/10/2014 33.6  30.0 - 36.0 g/dL Final  . RDW 04/10/2014 13.3  11.5 - 15.5 % Final  . Platelets 04/10/2014 214  150 - 400 K/uL Final  . Sodium 04/10/2014 137  137 - 147 mEq/L Final  . Potassium 04/10/2014 4.8  3.7 - 5.3 mEq/L Final  . Chloride 04/10/2014 102  96 - 112 mEq/L Final  . CO2 04/10/2014 26  19 - 32 mEq/L Final  . Glucose, Bld 04/10/2014 164* 70 - 99 mg/dL Final  . BUN 04/10/2014 15  6 - 23 mg/dL Final  . Creatinine, Ser 04/10/2014 0.78  0.50 - 1.10 mg/dL Final  . Calcium 04/10/2014 8.8  8.4 - 10.5 mg/dL Final  . GFR calc non Af Amer 04/10/2014 79* >90 mL/min Final  . GFR calc Af Amer 04/10/2014 >90  >90 mL/min Final   Comment: (NOTE)                          The eGFR has been calculated using the CKD EPI equation.  This calculation has not been validated in all clinical situations.                          eGFR's persistently <90 mL/min signify possible Chronic Kidney                          Disease.  . Anion gap 04/10/2014 9  5 - 15 Final  . WBC 04/11/2014 13.5* 4.0 - 10.5 K/uL Final  . RBC 04/11/2014 3.67* 3.87 - 5.11 MIL/uL Final  . Hemoglobin 04/11/2014 11.3* 12.0 - 15.0 g/dL Final  . HCT 04/11/2014 33.6* 36.0 - 46.0 % Final  . MCV 04/11/2014 91.6  78.0 - 100.0 fL Final  . MCH 04/11/2014 30.8  26.0 - 34.0 pg Final  . MCHC 04/11/2014 33.6  30.0 - 36.0 g/dL Final  . RDW 04/11/2014 13.3  11.5 - 15.5 % Final  . Platelets 04/11/2014 233  150 - 400 K/uL Final  . Sodium 04/11/2014 138  137 - 147 mEq/L Final  . Potassium 04/11/2014 4.8  3.7 - 5.3 mEq/L Final  . Chloride 04/11/2014 101  96 - 112 mEq/L Final  . CO2 04/11/2014 28  19 - 32 mEq/L Final  .  Glucose, Bld 04/11/2014 134* 70 - 99 mg/dL Final  . BUN 04/11/2014 17  6 - 23 mg/dL Final  . Creatinine, Ser 04/11/2014 0.74  0.50 - 1.10 mg/dL Final  . Calcium 04/11/2014 9.4  8.4 - 10.5 mg/dL Final  . GFR calc non Af Amer 04/11/2014 80* >90 mL/min Final  . GFR calc Af Amer 04/11/2014 >90  >90 mL/min Final   Comment: (NOTE)                          The eGFR has been calculated using the CKD EPI equation.                          This calculation has not been validated in all clinical situations.                          eGFR's persistently <90 mL/min signify possible Chronic Kidney                          Disease.  . Anion gap 04/11/2014 9  5 - 15 Final  . WBC 04/12/2014 11.9* 4.0 - 10.5 K/uL Final  . RBC 04/12/2014 3.47* 3.87 - 5.11 MIL/uL Final  . Hemoglobin 04/12/2014 10.8* 12.0 - 15.0 g/dL Final  . HCT 04/12/2014 32.3* 36.0 - 46.0 % Final  . MCV 04/12/2014 93.1  78.0 - 100.0 fL Final  . MCH 04/12/2014 31.1  26.0 - 34.0 pg Final  . MCHC 04/12/2014 33.4  30.0 - 36.0 g/dL Final  . RDW 04/12/2014 13.8  11.5 - 15.5 % Final  . Platelets 04/12/2014 245  150 - 400 K/uL Final  Hospital Outpatient Visit on 04/02/2014  Component Date Value Ref Range Status  . MRSA, PCR 04/02/2014 NEGATIVE  NEGATIVE Final  . Staphylococcus aureus 04/02/2014 NEGATIVE  NEGATIVE Final   Comment:                                 The Xpert SA Assay (FDA  approved for NASAL specimens                          in patients over 65 years of age),                          is one component of                          a comprehensive surveillance                          program.  Test performance has                          been validated by [employer] for patients greater                          than or equal to 64 year old.                          It is not intended                          to diagnose infection nor to                          guide or monitor  treatment.  Marland Kitchen aPTT 04/02/2014 30  24 - 37 seconds Final  . WBC 04/02/2014 7.2  4.0 - 10.5 K/uL Final  . RBC 04/02/2014 4.38  3.87 - 5.11 MIL/uL Final  . Hemoglobin 04/02/2014 13.4  12.0 - 15.0 g/dL Final  . HCT 04/02/2014 40.9  36.0 - 46.0 % Final  . MCV 04/02/2014 93.4  78.0 - 100.0 fL Final  . MCH 04/02/2014 30.6  26.0 - 34.0 pg Final  . MCHC 04/02/2014 32.8  30.0 - 36.0 g/dL Final  . RDW 04/02/2014 13.6  11.5 - 15.5 % Final  . Platelets 04/02/2014 240  150 - 400 K/uL Final  . Sodium 04/02/2014 140  137 - 147 mEq/L Final  . Potassium 04/02/2014 4.3  3.7 - 5.3 mEq/L Final  . Chloride 04/02/2014 101  96 - 112 mEq/L Final  . CO2 04/02/2014 28  19 - 32 mEq/L Final  . Glucose, Bld 04/02/2014 92  70 - 99 mg/dL Final  . BUN 04/02/2014 26* 6 - 23 mg/dL Final  . Creatinine, Ser 04/02/2014 0.86  0.50 - 1.10 mg/dL Final  . Calcium 04/02/2014 9.7  8.4 - 10.5 mg/dL Final  . Total Protein 04/02/2014 7.0  6.0 - 8.3 g/dL Final  . Albumin 04/02/2014 3.9  3.5 - 5.2 g/dL Final  . AST 04/02/2014 20  0 - 37 U/L Final  . ALT 04/02/2014 13  0 - 35 U/L Final  . Alkaline Phosphatase 04/02/2014 69  39 - 117 U/L Final  . Total Bilirubin 04/02/2014 0.4  0.3 - 1.2 mg/dL Final  . GFR calc non Af Amer 04/02/2014 64* >90 mL/min Final  . GFR calc Af Amer 04/02/2014 74* >90 mL/min Final   Comment: (NOTE)  The eGFR has been calculated using the CKD EPI equation.                          This calculation has not been validated in all clinical situations.                          eGFR's persistently <90 mL/min signify possible Chronic Kidney                          Disease.  . Anion gap 04/02/2014 11  5 - 15 Final  . Prothrombin Time 04/02/2014 14.1  11.6 - 15.2 seconds Final  . INR 04/02/2014 1.09  0.00 - 1.49 Final  . Color, Urine 04/02/2014 YELLOW  YELLOW Final  . APPearance 04/02/2014 CLOUDY* CLEAR Final  . Specific Gravity, Urine 04/02/2014 1.014  1.005 - 1.030 Final  . pH 04/02/2014  6.0  5.0 - 8.0 Final  . Glucose, UA 04/02/2014 NEGATIVE  NEGATIVE mg/dL Final  . Hgb urine dipstick 04/02/2014 NEGATIVE  NEGATIVE Final  . Bilirubin Urine 04/02/2014 NEGATIVE  NEGATIVE Final  . Ketones, ur 04/02/2014 NEGATIVE  NEGATIVE mg/dL Final  . Protein, ur 04/02/2014 NEGATIVE  NEGATIVE mg/dL Final  . Urobilinogen, UA 04/02/2014 0.2  0.0 - 1.0 mg/dL Final  . Nitrite 04/02/2014 NEGATIVE  NEGATIVE Final  . Leukocytes, UA 04/02/2014 LARGE* NEGATIVE Final  . Squamous Epithelial / LPF 04/02/2014 RARE  RARE Final  . WBC, UA 04/02/2014 7-10  <3 WBC/hpf Final  . RBC / HPF 04/02/2014 0-2  <3 RBC/hpf Final  . Bacteria, UA 04/02/2014 MANY* RARE Final     X-Rays:No results found.  EKG: Orders placed during the hospital encounter of 10/09/13  . EKG     Hospital Course: Melinda Kerr is a 79 y.o. who was admitted to [facility]. They were brought to the operating room on 04/09/2014 and underwent Procedure(s): RIGHT TOTAL KNEE ARTHROPLASTY.  Patient tolerated the procedure well and was later transferred to the recovery room and then to the orthopaedic floor for postoperative care.  They were given PO and IV analgesics for pain control following their surgery.  They were given 24 hours of postoperative antibiotics of  Anti-infectives   Start     Dose/Rate Route Frequency Ordered Stop   04/09/14 1400  ceFAZolin (ANCEF) IVPB 2 g/50 mL premix     2 g 100 mL/hr over 30 Minutes Intravenous Every 6 hours 04/09/14 1148 04/09/14 2121   04/09/14 0656  ceFAZolin (ANCEF) IVPB 2 g/50 mL premix     2 g 100 mL/hr over 30 Minutes Intravenous On call to O.R. 04/09/14 5397 04/09/14 0831     and started on DVT prophylaxis in the form of Xarelto.   PT and OT were ordered for total joint protocol.  Discharge planning consulted to help with postop disposition and equipment needs.  Patient had a good night on the evening of surgery and slept well.  They started to get up OOB with therapy on day one.  Hemovac drain was pulled without difficulty.  Continued to work with therapy into day two.  Dressing was changed on day two and the incision was healing well.  The CPM was stopped due to pain.  By day three, the patient had progressed with therapy and meeting their goals.  Incision was healing well.  Patient was seen in rounds and was  ready to go to [facility].  Discharge to SNF - [facility]  Diet - Cardiac diet  Follow up - in 2 weeks  Activity - WBAT  Disposition - [facility]  Condition Upon Discharge - Good  D/C Meds - See DC Summary  DVT Prophylaxis - Xarelto    Discharge Instructions   Call MD / Call 911    Complete by:  As directed   If you experience chest pain or shortness of breath, CALL 911 and be transported to the hospital emergency room.  If you develope a fever above 101 F, pus (white drainage) or increased drainage or redness at the wound, or calf pain, call your surgeon's office.     Change dressing    Complete by:  As directed   Change dressing daily with sterile 4 x 4 inch gauze dressing and apply TED hose. Do not submerge the incision under water.     Constipation Prevention    Complete by:  As directed   Drink plenty of fluids.  Prune juice may be helpful.  You may use a stool softener, such as Colace (over the counter) 100 mg twice a day.  Use MiraLax (over the counter) for constipation as needed.     Diet - low sodium heart healthy    Complete by:  As directed      Discharge instructions    Complete by:  As directed   Pick up stool softner and laxative for home. Do not submerge incision under water. May shower. Continue to use ice for pain and swelling from surgery.  Take Xarelto for two and a half more weeks, then discontinue Xarelto. Once the patient has completed the Xarelto, they may resume the 81 mg Aspirin.  Needs to follow up in office next Thursday July 23rd.     Do not put a pillow under the knee. Place it under the heel.    Complete by:  As  directed      Do not sit on low chairs, stoools or toilet seats, as it may be difficult to get up from low surfaces    Complete by:  As directed      Driving restrictions    Complete by:  As directed   No driving until released by the physician.     Increase activity slowly as tolerated    Complete by:  As directed      Lifting restrictions    Complete by:  As directed   No lifting until released by the physician.     Patient may shower    Complete by:  As directed   You may shower without a dressing once there is no drainage.  Do not wash over the wound.  If drainage remains, do not shower until drainage stops.     TED hose    Complete by:  As directed   Use stockings (TED hose) for 3 weeks on both leg(s).  You may remove them at night for sleeping.     Weight bearing as tolerated    Complete by:  As directed             Medication List    STOP taking these medications       alendronate 70 MG tablet  Commonly known as:  FOSAMAX     aspirin 81 MG tablet     calcium-vitamin D 500-200 MG-UNIT per tablet  Commonly known as:  OSCAL WITH D     cholecalciferol  1000 UNITS tablet  Commonly known as:  VITAMIN D     HAWTHORN BERRY PO     MELATONIN PO     multivitamin tablet     Omega-3 300 MG Caps     VITAMIN B-6 PO     vitamin C 500 MG tablet  Commonly known as:  ASCORBIC ACID     VITAMIN E PO      TAKE these medications       acetaminophen 500 MG tablet  Commonly known as:  TYLENOL  Take 1,000 mg by mouth 2 (two) times daily.     allopurinol 300 MG tablet  Commonly known as:  ZYLOPRIM  Take 300 mg by mouth daily with breakfast.     alum & mag hydroxide-simeth 353-614-43 MG/5ML suspension  Commonly known as:  MAALOX/MYLANTA  Take 30 mLs by mouth every 4 (four) hours as needed for indigestion or heartburn.     bisacodyl 10 MG suppository  Commonly known as:  DULCOLAX  Place 1 suppository (10 mg total) rectally daily as needed for mild constipation or  moderate constipation.     citalopram 20 MG tablet  Commonly known as:  CELEXA  Take 20 mg by mouth at bedtime.     DSS 100 MG Caps  Take 100 mg by mouth 2 (two) times daily.     furosemide 40 MG tablet  Commonly known as:  LASIX  Take 20 mg by mouth every morning.     hydrOXYzine 25 MG tablet  Commonly known as:  ATARAX/VISTARIL  Take 25 mg by mouth at bedtime.     levothyroxine 125 MCG tablet  Commonly known as:  SYNTHROID, LEVOTHROID  Take 125 mcg by mouth daily before breakfast.     lisinopril 20 MG tablet  Commonly known as:  PRINIVIL,ZESTRIL  Take 20 mg by mouth daily with breakfast.     methocarbamol 500 MG tablet  Commonly known as:  ROBAXIN  Take 1 tablet (500 mg total) by mouth every 6 (six) hours as needed for muscle spasms.     omeprazole 20 MG tablet  Commonly known as:  PRILOSEC OTC  Take 20 mg by mouth daily.     ondansetron 4 MG tablet  Commonly known as:  ZOFRAN  Take 1 tablet (4 mg total) by mouth every 6 (six) hours as needed for nausea.     OVER THE COUNTER MEDICATION  1 tablet daily. Iron supplement     oxyCODONE 5 MG immediate release tablet  Commonly known as:  Oxy IR/ROXICODONE  Take 1-2 tablets (5-10 mg total) by mouth every 3 (three) hours as needed for moderate pain, severe pain or breakthrough pain.     polyethylene glycol packet  Commonly known as:  MIRALAX / GLYCOLAX  Take 17 g by mouth daily as needed for mild constipation or moderate constipation.     ranitidine 300 MG tablet  Commonly known as:  ZANTAC  Take 300 mg by mouth at bedtime.     rivaroxaban 10 MG Tabs tablet  Commonly known as:  XARELTO  - Take 1 tablet (10 mg total) by mouth daily with breakfast. Take Xarelto for two and a half more weeks, then discontinue Xarelto.  - Once the patient has completed the Xarelto, they may resume the 81 mg Aspirin.     simvastatin 40 MG tablet  Commonly known as:  ZOCOR  Take 20 mg by mouth at bedtime.     tolterodine 4 MG 24 hr  capsule  Commonly known as:  DETROL LA  Take 4 mg by mouth daily.     traMADol 50 MG tablet  Commonly known as:  ULTRAM  Take 1-2 tablets (50-100 mg total) by mouth every 6 (six) hours as needed (mild pain).           Follow-up Information   Follow up with [name], MD. Schedule an appointment as soon as possible for a visit on 04/19/2014. (Please call office to setup the appointment fo next Thursday July 23rd.)    Specialty:  Orthopedic Surgery   Contact information:   [address] [place] [address] [phone]       Signed: Arlee Muslim, PA-C Orthopaedic Surgery 04/12/2014, 8:06 AM

## 2014-04-12 NOTE — Progress Notes (Signed)
Clinical Social Work Department CLINICAL SOCIAL WORK PLACEMENT NOTE 04/12/2014  Patient:  SULA, FETTERLY  Account Number:  [id number] Admit date:  04/09/2014  Clinical Social Worker:  Werner Lean, LCSW  Date/time:  04/09/2014 01:57 PM  Clinical Social Work is seeking post-discharge placement for this patient at the following level of care:   SKILLED NURSING   (*CSW will update this form in Epic as items are completed)     Patient/family provided with [facility] Department of Clinical Social Work's list of facilities offering this level of care within the geographic area requested by the patient (or if unable, by the patient's family).  04/09/2014  Patient/family informed of their freedom to choose among providers that offer the needed level of care, that participate in Medicare, Medicaid or managed care program needed by the patient, have an available bed and are willing to accept the patient.    Patient/family informed of [facility]' ownership interest in [facility], as well as of the fact that they are under no obligation to receive care at this facility.  PASARR submitted to EDS on 04/09/2014 PASARR number received on 04/09/2014  FL2 transmitted to all facilities in geographic area requested by pt/family on  04/09/2014 FL2 transmitted to all facilities within larger geographic area on   Patient informed that his/her managed care company has contracts with or will negotiate with  certain facilities, including the following:     Patient/family informed of bed offers received:  04/09/2014 Patient chooses bed at  Physician recommends and patient chooses bed at  [facility]  Patient to be transferred to [facility] on  04/12/2014 Patient to be transferred to facility by [facility] Patient and family notified of transfer on 04/12/2014 Name of family member notified:  Pt declined CSW assistance .  The following physician request were entered in Epic:   Additional  Comments: Pt / niece are in agreement with d/c to SNF today via [facility] transport. NSG reviewed d/c summary, scripts,avs. Scripts are included in d/c packet.  Werner Lean LCSW [phone]

## 2014-04-12 NOTE — Progress Notes (Signed)
Pt d/c to [facility]. Attempted to give report to facility and couldn't reach the nurse receiving patient. Wrote contact info on d/c packet so nurse could contact me if needed.

## 2014-04-12 NOTE — Progress Notes (Signed)
LATE ENTRY NOTE Date of Service of Visit - 04/11/2014    Subjective: 2 Days Post-Op Procedure(s) (LRB): RIGHT TOTAL KNEE ARTHROPLASTY (Right) Patient reports pain as mild and moderate.   Patient seen in rounds with Dr. Wynelle Link.  Will DC the CPM Patient is well, but has had some minor complaints of pain in the knee, requiring pain medications We will start therapy today.  Plan is to go [facility] after hospital stay.  Objective: Vital signs in last 24 hours: Temp:  [98.1 F (36.7 C)-98.7 F (37.1 C)] 98.1 F (36.7 C) (07/16 0430) Pulse Rate:  [73-81] 80 (07/16 0430) Resp:  [14-20] 20 (07/16 0430) BP: (135-140)/(40-75) 135/40 mmHg (07/16 0430) SpO2:  [94 %-98 %] 96 % (07/16 0430)  Intake/Output from previous day:  Intake/Output Summary (Last 24 hours) at 04/12/14 0743 Last data filed at 04/12/14 0430  Gross per 24 hour  Intake   1200 ml  Output   1350 ml  Net   -150 ml    Intake/Output this shift:    Labs:  Recent Labs  04/10/14 0428 04/11/14 0546   HGB 10.9* 11.3*     Recent Labs  04/11/14 0546   WBC 13.5*   RBC 3.67*   HCT 33.6*   PLT 233     Recent Labs  04/10/14 0428 04/11/14 0546  NA 137 138  K 4.8 4.8  CL 102 101  CO2 26 28  BUN 15 17  CREATININE 0.78 0.74  GLUCOSE 164* 134*  CALCIUM 8.8 9.4   No results found for this basename: LABPT, INR,  in the last 72 hours  EXAM General - Patient is Alert and Appropriate Extremity - Neurovascular intact Sensation intact distally Dorsiflexion/Plantar flexion intact Dressing - dressing C/D/I and no drainage, incision looks good Motor Function - intact, moving foot and toes well on exam.    Past Medical History  Diagnosis Date  . Hyperlipidemia     under control  . Depression   . OSA on CPAP   . Spondylosis     with scoliosis  . Phlebitis     hx of  . Esophageal reflux   . Morbid obesity with BMI of 40.0-44.9, adult   . Hypertension   . Osteoarthritis   . Prediabetes     under  control with diet    Assessment/Plan: 2 Days Post-Op Procedure(s) (LRB): RIGHT TOTAL KNEE ARTHROPLASTY (Right) Active Problems:   OA (osteoarthritis) of knee  Estimated body mass index is 44.53 kg/(m^2) as calculated from the following:   Height as of this encounter: 5' (1.524 m).   Weight as of this encounter: 103.42 kg (228 lb). Up with therapy Plan for discharge tomorrow Discharge to SNF  DVT Prophylaxis - Xarelto Weight-Bearing as tolerated to right leg D/C O2 and Pulse OX and try on Room Air  Arlee Muslim, PA-C Orthopaedic Surgery 04/12/2014, 7:43 AM

## 2014-04-13 ENCOUNTER — Non-Acute Institutional Stay (SKILLED_NURSING_FACILITY): Payer: Medicare Other | Admitting: Adult Health

## 2014-04-13 DIAGNOSIS — G4733 Obstructive sleep apnea (adult) (pediatric): Secondary | ICD-10-CM | POA: Diagnosis not present

## 2014-04-13 DIAGNOSIS — I10 Essential (primary) hypertension: Secondary | ICD-10-CM

## 2014-04-13 DIAGNOSIS — Z96651 Presence of right artificial knee joint: Secondary | ICD-10-CM

## 2014-04-13 DIAGNOSIS — M1710 Unilateral primary osteoarthritis, unspecified knee: Secondary | ICD-10-CM | POA: Diagnosis not present

## 2014-04-13 DIAGNOSIS — Z9989 Dependence on other enabling machines and devices: Secondary | ICD-10-CM

## 2014-04-13 DIAGNOSIS — E038 Other specified hypothyroidism: Secondary | ICD-10-CM

## 2014-04-13 DIAGNOSIS — Z96659 Presence of unspecified artificial knee joint: Secondary | ICD-10-CM

## 2014-04-13 DIAGNOSIS — M109 Gout, unspecified: Secondary | ICD-10-CM

## 2014-04-13 DIAGNOSIS — M1009 Idiopathic gout, multiple sites: Secondary | ICD-10-CM

## 2014-04-13 DIAGNOSIS — M170 Bilateral primary osteoarthritis of knee: Secondary | ICD-10-CM

## 2014-04-16 ENCOUNTER — Non-Acute Institutional Stay (SKILLED_NURSING_FACILITY): Payer: Medicare Other | Admitting: Internal Medicine

## 2014-04-16 ENCOUNTER — Encounter: Payer: Self-pay | Admitting: Adult Health

## 2014-04-16 ENCOUNTER — Encounter: Payer: Self-pay | Admitting: Internal Medicine

## 2014-04-16 DIAGNOSIS — M1710 Unilateral primary osteoarthritis, unspecified knee: Secondary | ICD-10-CM | POA: Diagnosis not present

## 2014-04-16 DIAGNOSIS — N3946 Mixed incontinence: Secondary | ICD-10-CM

## 2014-04-16 DIAGNOSIS — I10 Essential (primary) hypertension: Secondary | ICD-10-CM | POA: Insufficient documentation

## 2014-04-16 DIAGNOSIS — K5909 Other constipation: Secondary | ICD-10-CM

## 2014-04-16 DIAGNOSIS — G4733 Obstructive sleep apnea (adult) (pediatric): Secondary | ICD-10-CM

## 2014-04-16 DIAGNOSIS — E038 Other specified hypothyroidism: Secondary | ICD-10-CM

## 2014-04-16 DIAGNOSIS — F32A Depression, unspecified: Secondary | ICD-10-CM

## 2014-04-16 DIAGNOSIS — F3289 Other specified depressive episodes: Secondary | ICD-10-CM

## 2014-04-16 DIAGNOSIS — D62 Acute posthemorrhagic anemia: Secondary | ICD-10-CM

## 2014-04-16 DIAGNOSIS — Z96651 Presence of right artificial knee joint: Secondary | ICD-10-CM | POA: Insufficient documentation

## 2014-04-16 DIAGNOSIS — E785 Hyperlipidemia, unspecified: Secondary | ICD-10-CM

## 2014-04-16 DIAGNOSIS — M1711 Unilateral primary osteoarthritis, right knee: Secondary | ICD-10-CM

## 2014-04-16 DIAGNOSIS — F329 Major depressive disorder, single episode, unspecified: Secondary | ICD-10-CM

## 2014-04-16 DIAGNOSIS — Z9989 Dependence on other enabling machines and devices: Secondary | ICD-10-CM

## 2014-04-16 NOTE — Progress Notes (Signed)
Patient ID: Melinda Kerr, female   DOB: [birth year], 79 y.o.   MRN: [id number]     Facility: [facility]    PCP: [name], MD  Code Status: full code  Allergies  Allergen Reactions  . Oxybutynin     Bleeding gums, felt sick    Chief Complaint: new admission  HPI:  79 y/o female patient is here for STR after hospital admission for right total knee arthroplasty with severe right knee OA. She is working with therapy team. Her pain is under control. Has regular bowel movement, had one yesterday. Complaints of occassional muscle spasm. She has no concerns this visit. No new concern from staff  Review of Systems:  Constitutional: Negative for fever, chills, weight loss, malaise/fatigue and diaphoresis.  HENT: Negative for congestion, hearing loss and sore throat.   Eyes: Negative for eye pain, blurred vision, double vision and discharge.  Respiratory: Negative for cough, sputum production, shortness of breath and wheezing.   Cardiovascular: Negative for chest pain, palpitations, orthopnea and leg swelling.  Gastrointestinal: Negative for heartburn, nausea, vomiting, abdominal pain Musculoskeletal: Negative for back pain, falls Skin: Negative for itching and rash.  Neurological: Negative for dizziness, tingling, focal weakness and headaches.  Psychiatric/Behavioral: Negative for depression and memory loss. The patient is not nervous/anxious.     Past Medical History  Diagnosis Date  . Hyperlipidemia     under control  . Depression   . OSA on CPAP   . Spondylosis     with scoliosis  . Phlebitis     hx of  . Esophageal reflux   . Morbid obesity with BMI of 40.0-44.9, adult   . Hypertension   . Osteoarthritis   . Prediabetes     under control with diet   Past Surgical History  Procedure Laterality Date  . Tubal ligation  age 39  . Varicose vein surgery Bilateral age 25  . Wisdom tooth extraction  age 89  . Hernia repair  age 84      umblical  . Cholecystectomy  age 75  . Total knee arthroplasty Left 10/09/2013    Procedure: LEFT TOTAL KNEE ARTHROPLASTY;  Surgeon: [name], MD;  Location: [facility];  Service: Orthopedics;  Laterality: Left;  . Total knee arthroplasty Right 04/09/2014    Procedure: RIGHT TOTAL KNEE ARTHROPLASTY;  Surgeon: [name], MD;  Location: [facility];  Service: Orthopedics;  Laterality: Right;   Social History:   reports that she has never smoked. She has never used smokeless tobacco. She reports that she does not drink alcohol or use illicit drugs.  Family History  Problem Relation Age of Onset  . Heart disease Mother   . Heart disease Father   . CVA Father     Medications: Patient's Medications  New Prescriptions   No medications on file  Previous Medications   ACETAMINOPHEN (TYLENOL) 500 MG TABLET    Take 1,000 mg by mouth 2 (two) times daily.    ALLOPURINOL (ZYLOPRIM) 300 MG TABLET    Take 300 mg by mouth daily with breakfast.    ALUM & MAG HYDROXIDE-SIMETH (MAALOX/MYLANTA) 200-200-20 MG/5ML SUSPENSION    Take 30 mLs by mouth every 4 (four) hours as needed for indigestion or heartburn.   BISACODYL (DULCOLAX) 10 MG SUPPOSITORY    Place 1 suppository (10 mg total) rectally daily as needed for mild constipation or moderate constipation.   CITALOPRAM (CELEXA) 20 MG TABLET    Take 20 mg by  mouth at bedtime.    DOCUSATE SODIUM 100 MG CAPS    Take 100 mg by mouth 2 (two) times daily.   FUROSEMIDE (LASIX) 40 MG TABLET    Take 20 mg by mouth every morning.    HYDROXYZINE (ATARAX/VISTARIL) 25 MG TABLET    Take 25 mg by mouth at bedtime.   LEVOTHYROXINE (SYNTHROID, LEVOTHROID) 125 MCG TABLET    Take 125 mcg by mouth daily before breakfast.   LISINOPRIL (PRINIVIL,ZESTRIL) 20 MG TABLET    Take 20 mg by mouth daily with breakfast.    METHOCARBAMOL (ROBAXIN) 500 MG TABLET    Take 1 tablet (500 mg total) by mouth every 6 (six) hours as needed for muscle spasms.   OMEPRAZOLE (PRILOSEC OTC) 20  MG TABLET    Take 20 mg by mouth daily.   ONDANSETRON (ZOFRAN) 4 MG TABLET    Take 1 tablet (4 mg total) by mouth every 6 (six) hours as needed for nausea.   OVER THE COUNTER MEDICATION    1 tablet daily. Iron supplement   OXYCODONE (OXY IR/ROXICODONE) 5 MG IMMEDIATE RELEASE TABLET    Take 1-2 tablets (5-10 mg total) by mouth every 3 (three) hours as needed for moderate pain, severe pain or breakthrough pain.   POLYETHYLENE GLYCOL (MIRALAX / GLYCOLAX) PACKET    Take 17 g by mouth daily as needed for mild constipation or moderate constipation.   RANITIDINE (ZANTAC) 300 MG TABLET    Take 300 mg by mouth at bedtime.   RIVAROXABAN (XARELTO) 10 MG TABS TABLET    Take 1 tablet (10 mg total) by mouth daily with breakfast. Take Xarelto for two and a half more weeks, then discontinue Xarelto. Once the patient has completed the Xarelto, they may resume the 81 mg Aspirin.   SIMVASTATIN (ZOCOR) 40 MG TABLET    Take 20 mg by mouth at bedtime.   TOLTERODINE (DETROL LA) 4 MG 24 HR CAPSULE    Take 4 mg by mouth daily.   TRAMADOL (ULTRAM) 50 MG TABLET    Take 1-2 tablets (50-100 mg total) by mouth every 6 (six) hours as needed (mild pain).  Modified Medications   No medications on file  Discontinued Medications   No medications on file    Physical Exam: Filed Vitals:   04/16/14 1549  BP: 120/64  Pulse: 74  Temp: 96.8 F (36 C)  Resp: 18  SpO2: 95%   General- elderly female in no acute distress Head- atraumatic, normocephalic Cardiovascular- normal s1,s2, no murmurs Respiratory- bilateral clear to auscultation, no wheeze, no rhonchi, no crackles, no use of accessory muscles Abdomen- bowel sounds present, soft, non tender Musculoskeletal- able to move all 4 extremities, right knee ROM limited, trace right leg edema Neurological- no focal deficit Skin- warm and dry, incision site healing well, dressing clen and dry Psychiatry- alert and oriented to person, place and time, normal mood and  affect   Labs reviewed: Basic Metabolic Panel:  Recent Labs  04/02/14 1448 04/10/14 0428 04/11/14 0546  NA 140 137 138  K 4.3 4.8 4.8  CL 101 102 101  CO2 28 26 28   GLUCOSE 92 164* 134*  BUN 26* 15 17  CREATININE 0.86 0.78 0.74  CALCIUM 9.7 8.8 9.4   Liver Function Tests:  Recent Labs  10/02/13 1410 04/02/14 1448  AST 18 20  ALT 13 13  ALKPHOS 58 69  BILITOT 0.5 0.4  PROT 6.4 7.0  ALBUMIN 3.7 3.9   CBC:  Recent Labs  04/10/14 0428 04/11/14 0546 04/12/14 0436  WBC 10.9* 13.5* 11.9*  HGB 10.9* 11.3* 10.8*  HCT 32.4* 33.6* 32.3*  MCV 92.6 91.6 93.1  PLT 214 233 245    Radiological Exams: 10-02-13: chest x-ray: Hiatal hernia.  No acute abnormality is noted.   Assessment/Plan  Right knee OA S/p right TKA. Will have patient work with PT/OT as tolerated to regain strength and restore function.  Fall precautions are in place. Continue xarelto for dvt prophylaxis until 04/30/14 and then resume her aspirin. Continue prn ultram for pain, robaxin for muscle spasm. Has orthopedic follow up  Obstructive sleep apnea continue her cpap at night   Hypertension continue lisinopril 20 mg daily and lasix 40 mg daily, check bmp  Anemia continue  iron daily, monitor cbc  Depression stable on celexa 20 mg daily   gerd continue prilosec 20 gm daily in the am and zantac 300 mg nightly   Constipation continue miralax daily as needed  Dyslipidemia continue zocor 40 mg daily  UI continue detrol 4 mg daily  Hypothyroidism continue synthroid 125 mcg daily    Family/ staff Communication: reviewed care plan with patient and nursing supervisor   Goals of care: short term rehabilitation   Labs/tests ordered: cbc, cmp in 1 week    Blanchie Serve, MD  [facility] [phone] (Monday-Friday 8 am - 5 pm) [phone] (afterhours)

## 2014-04-16 NOTE — Progress Notes (Signed)
Patient ID: Melinda Kerr, female   DOB: 01-Mar-1935, 79 y.o.   MRN: [id number]     [facility]  Allergies  Allergen Reactions  . Oxybutynin     Bleeding gums, felt sick     Chief Complaint  Patient presents with  . Hospitalization Follow-up    HPI:  She has been hospitalized for right knee replacement and is here for short term rehab. Her goal is to return back home as soon as she completes her inpatient rehab. She is not voicing any complaints today.   Past Medical History  Diagnosis Date  . Hyperlipidemia     under control  . Depression   . OSA on CPAP   . Spondylosis     with scoliosis  . Phlebitis     hx of  . Esophageal reflux   . Morbid obesity with BMI of 40.0-44.9, adult   . Hypertension   . Osteoarthritis   . Prediabetes     under control with diet    Past Surgical History  Procedure Laterality Date  . Tubal ligation  age 68  . Varicose vein surgery Bilateral age 22  . Wisdom tooth extraction  age 48  . Hernia repair  age 69    umblical  . Cholecystectomy  age 23  . Total knee arthroplasty Left 10/09/2013    Procedure: LEFT TOTAL KNEE ARTHROPLASTY;  Surgeon: [name], MD;  Location: [facility];  Service: Orthopedics;  Laterality: Left;  . Total knee arthroplasty Right 04/09/2014    Procedure: RIGHT TOTAL KNEE ARTHROPLASTY;  Surgeon: [name], MD;  Location: [facility];  Service: Orthopedics;  Laterality: Right;    VITAL SIGNS BP 98/58  Pulse 79  Ht 5' (1.524 m)  Wt 246 lb 9.6 oz (111.857 kg)  BMI 48.16 kg/m2   Patient's Medications  New Prescriptions   No medications on file  Previous Medications   ACETAMINOPHEN (TYLENOL) 500 MG TABLET    Take 1,000 mg by mouth 2 (two) times daily.    ALLOPURINOL (ZYLOPRIM) 300 MG TABLET    Take 300 mg by mouth daily with breakfast.    ALUM & MAG HYDROXIDE-SIMETH (MAALOX/MYLANTA) 200-200-20 MG/5ML SUSPENSION    Take 30 mLs by mouth every 4 (four) hours as needed for indigestion or heartburn.   BISACODYL (DULCOLAX) 10 MG SUPPOSITORY    Place 1 suppository (10 mg total) rectally daily as needed for mild constipation or moderate constipation.   CITALOPRAM (CELEXA) 20 MG TABLET    Take 20 mg by mouth at bedtime.    DOCUSATE SODIUM 100 MG CAPS    Take 100 mg by mouth 2 (two) times daily.   FUROSEMIDE (LASIX) 40 MG TABLET    Take 20 mg by mouth every morning.    HYDROXYZINE (ATARAX/VISTARIL) 25 MG TABLET    Take 25 mg by mouth at bedtime.   LEVOTHYROXINE (SYNTHROID, LEVOTHROID) 125 MCG TABLET    Take 125 mcg by mouth daily before breakfast.   LISINOPRIL (PRINIVIL,ZESTRIL) 20 MG TABLET    Take 20 mg by mouth daily with breakfast.    METHOCARBAMOL (ROBAXIN) 500 MG TABLET    Take 1 tablet (500 mg total) by mouth every 6 (six) hours as needed for muscle spasms.   OMEPRAZOLE (PRILOSEC OTC) 20 MG TABLET    Take 20 mg by mouth daily.   ONDANSETRON (ZOFRAN) 4 MG TABLET    Take 1 tablet (4 mg total) by mouth every 6 (six) hours as needed for  nausea.   OVER THE COUNTER MEDICATION    1 tablet daily. Iron supplement   OXYCODONE (OXY IR/ROXICODONE) 5 MG IMMEDIATE RELEASE TABLET    Take 1-2 tablets (5-10 mg total) by mouth every 3 (three) hours as needed for moderate pain, severe pain or breakthrough pain.   POLYETHYLENE GLYCOL (MIRALAX / GLYCOLAX) PACKET    Take 17 g by mouth daily as needed for mild constipation or moderate constipation.   RANITIDINE (ZANTAC) 300 MG TABLET    Take 300 mg by mouth at bedtime.   RIVAROXABAN (XARELTO) 10 MG TABS TABLET    Take 1 tablet (10 mg total) by mouth daily with breakfast. Take Xarelto for two and a half more weeks, then discontinue Xarelto. Once the patient has completed the Xarelto, they may resume the 81 mg Aspirin.   SIMVASTATIN (ZOCOR) 40 MG TABLET    Take 20 mg by mouth at bedtime.   TOLTERODINE (DETROL LA) 4 MG 24 HR CAPSULE    Take 4 mg by mouth daily.   TRAMADOL (ULTRAM) 50 MG TABLET    Take 1-2 tablets (50-100 mg total) by mouth every 6 (six) hours as needed  (mild pain).  Modified Medications   No medications on file  Discontinued Medications   No medications on file    SIGNIFICANT DIAGNOSTIC EXAMS   10-02-13: chest x-ray: Hiatal hernia.  No acute abnormality is noted.   LABS REVIEWED:  10-02-13: wbc 7.2; hgb 12.5; hct 41.0; mcv 93.0; plt 245; glucose 89; bn 17; creat 0.84; k+4.4; na++136 liver normal albumin 3.7 10-12-13: wbc 12.1; hgb 11.1; hct 34.3 ;mcv 93.0; plt 182 04-11-14: wbc 13.5; hgb 11.3; hct 33.6; mcv 91.6; plt 233; glucose 134; bun 17; creat 0.74; k+4.8; na++138     Review of Systems  Constitutional: Negative for malaise/fatigue.  Respiratory: Negative for cough and shortness of breath.   Cardiovascular: Negative for chest pain, palpitations and leg swelling.  Gastrointestinal: . Negative for heartburn; constipation and abdominal pain.  Musculoskeletal: no complaint of pain  Skin: Negative.   Neurological: Negative for headaches.  Psychiatric/Behavioral: Negative for depression. The patient is not nervous/anxious.      Physical Exam  Constitutional: She is oriented to person, place, and time. She appears well-developed and well-nourished. No distress.  obese  Neck: Neck supple. No JVD present. No thyromegaly present.  Cardiovascular: Normal rate, regular rhythm and intact distal pulses.   Respiratory: Effort normal and breath sounds normal. No respiratory distress. She has no wheezes.  GI: Soft. Bowel sounds are normal. She exhibits no distension. There is no tenderness.  Musculoskeletal: She exhibits no edema.  Is able to move all extremities; is status post right knee replacement is in knee immobilizer   Neurological: She is alert and oriented to person, place, and time.  Skin: Skin is warm and dry. She is not diaphoretic.   Psychiatric: She has a normal mood and affect.     ASSESSMENT/ PLAN:   1. Obstructive sleep apnea: will continue her cpap at night and will monitor  2. Osteoarthritis of knees status post  right  knee replacement: will continue therapy as directed; will continue xarelto 10 mg daily through 04-30-14  will then start asa daily. Will continue ultram 50 or 100 mg every 6 hours as needed for pain; robaxin 500 mg every 6 hours as needed for spasms and will continue tylenol 1 gm three times daily.   3. Hypertension: is stable will continue lisinopril 20 mg daily and will monitor  4. Anemia: will continue  iron daily  5. Dyslipidemia: will continue zocor 40 mg daily  6. UI: will continue detrol la 4 mg daily; she states without this medication; she leaks urine all the time  7. Hypothyroidism:will continue synthroid 125 mcg daily   8. Edema: will continue lasix 40 mg daily  9. Gout: no recent flares: will continue allopurinol 300 mg daily   10. Depression: is stable will continue celexa 20 mg daily   11. Jerrye Bushy: will continue prilosec 20 gm daily in the am and zantac 300 mg nightly   12: Constipation: will continue miralax daily as needed    Time spent with patient 50 minutes.

## 2014-04-19 ENCOUNTER — Non-Acute Institutional Stay (SKILLED_NURSING_FACILITY): Payer: Medicare Other | Admitting: Adult Health

## 2014-04-19 DIAGNOSIS — Z96659 Presence of unspecified artificial knee joint: Secondary | ICD-10-CM

## 2014-04-19 DIAGNOSIS — Z96651 Presence of right artificial knee joint: Secondary | ICD-10-CM

## 2014-04-19 DIAGNOSIS — I10 Essential (primary) hypertension: Secondary | ICD-10-CM | POA: Diagnosis not present

## 2014-04-19 DIAGNOSIS — M179 Osteoarthritis of knee, unspecified: Secondary | ICD-10-CM

## 2014-04-19 DIAGNOSIS — M1710 Unilateral primary osteoarthritis, unspecified knee: Secondary | ICD-10-CM

## 2014-04-20 ENCOUNTER — Encounter: Payer: Self-pay | Admitting: Adult Health

## 2014-04-20 NOTE — Progress Notes (Signed)
Patient ID: Melinda Kerr, female   DOB: 1935-04-23, 79 y.o.   MRN: [id number]     [facility]  Allergies  Allergen Reactions  . Oxybutynin     Bleeding gums, felt sick     Chief Complaint  Patient presents with  . Discharge Note    HPI:  She is being discharged to home with home health for pt/ot to improve up strength; gait and mobility. She will not need dme. She will need her prescriptions to be written for her.  She had been hospitalized for a right total knee replacement.   Past Medical History  Diagnosis Date  . Hyperlipidemia     under control  . Depression   . OSA on CPAP   . Spondylosis     with scoliosis  . Phlebitis     hx of  . Esophageal reflux   . Morbid obesity with BMI of 40.0-44.9, adult   . Hypertension   . Osteoarthritis   . Prediabetes     under control with diet    Past Surgical History  Procedure Laterality Date  . Tubal ligation  age 7  . Varicose vein surgery Bilateral age 89  . Wisdom tooth extraction  age 6  . Hernia repair  age 48    umblical  . Cholecystectomy  age 80  . Total knee arthroplasty Left 10/09/2013    Procedure: LEFT TOTAL KNEE ARTHROPLASTY;  Surgeon: [name], MD;  Location: [facility];  Service: Orthopedics;  Laterality: Left;  . Total knee arthroplasty Right 04/09/2014    Procedure: RIGHT TOTAL KNEE ARTHROPLASTY;  Surgeon: [name], MD;  Location: [facility];  Service: Orthopedics;  Laterality: Right;    VITAL SIGNS BP 129/80  Pulse 80  Ht 5' (1.524 m)  Wt 246 lb (111.585 kg)  BMI 48.04 kg/m2   Patient's Medications  New Prescriptions   No medications on file  Previous Medications   ACETAMINOPHEN (TYLENOL) 500 MG TABLET    Take 1,000 mg by mouth 2 (two) times daily.    ALLOPURINOL (ZYLOPRIM) 300 MG TABLET    Take 300 mg by mouth daily with breakfast.    ALUM & MAG HYDROXIDE-SIMETH (MAALOX/MYLANTA) 200-200-20 MG/5ML SUSPENSION    Take 30 mLs by mouth every 4 (four) hours as needed for indigestion or  heartburn.   BISACODYL (DULCOLAX) 10 MG SUPPOSITORY    Place 1 suppository (10 mg total) rectally daily as needed for mild constipation or moderate constipation.   CITALOPRAM (CELEXA) 20 MG TABLET    Take 20 mg by mouth at bedtime.    DOCUSATE SODIUM 100 MG CAPS    Take 100 mg by mouth 2 (two) times daily.   FUROSEMIDE (LASIX) 40 MG TABLET    Take 20 mg by mouth every morning.    HYDROXYZINE (ATARAX/VISTARIL) 25 MG TABLET    Take 25 mg by mouth at bedtime.   LEVOTHYROXINE (SYNTHROID, LEVOTHROID) 125 MCG TABLET    Take 125 mcg by mouth daily before breakfast.   LISINOPRIL (PRINIVIL,ZESTRIL) 20 MG TABLET    Take 20 mg by mouth daily with breakfast.    METHOCARBAMOL (ROBAXIN) 500 MG TABLET    Take 1 tablet (500 mg total) by mouth every 6 (six) hours as needed for muscle spasms.   OMEPRAZOLE (PRILOSEC OTC) 20 MG TABLET    Take 20 mg by mouth daily.   ONDANSETRON (ZOFRAN) 4 MG TABLET    Take 1 tablet (4 mg total) by mouth every  6 (six) hours as needed for nausea.   OVER THE COUNTER MEDICATION    1 tablet daily. Iron supplement   OXYCODONE (OXY IR/ROXICODONE) 5 MG IMMEDIATE RELEASE TABLET    Take 1-2 tablets (5-10 mg total) by mouth every 3 (three) hours as needed for moderate pain, severe pain or breakthrough pain.   POLYETHYLENE GLYCOL (MIRALAX / GLYCOLAX) PACKET    Take 17 g by mouth daily as needed for mild constipation or moderate constipation.   RANITIDINE (ZANTAC) 300 MG TABLET    Take 300 mg by mouth at bedtime.   RIVAROXABAN (XARELTO) 10 MG TABS TABLET    Take 1 tablet (10 mg total) by mouth daily with breakfast. Take Xarelto for two and a half more weeks, then discontinue Xarelto. Once the patient has completed the Xarelto, they may resume the 81 mg Aspirin.   SIMVASTATIN (ZOCOR) 40 MG TABLET    Take 20 mg by mouth at bedtime.   TOLTERODINE (DETROL LA) 4 MG 24 HR CAPSULE    Take 4 mg by mouth daily.   TRAMADOL (ULTRAM) 50 MG TABLET    Take 1-2 tablets (50-100 mg total) by mouth every 6 (six)  hours as needed (mild pain).  Modified Medications   No medications on file  Discontinued Medications   No medications on file    SIGNIFICANT DIAGNOSTIC EXAMS   10-02-13: chest x-ray: Hiatal hernia.  No acute abnormality is noted.   LABS REVIEWED:  10-02-13: wbc 7.2; hgb 12.5; hct 41.0; mcv 93.0; plt 245; glucose 89; bn 17; creat 0.84; k+4.4; na++136 liver normal albumin 3.7 10-12-13: wbc 12.1; hgb 11.1; hct 34.3 ;mcv 93.0; plt 182 04-11-14: wbc 13.5; hgb 11.3; hct 33.6; mcv 91.6; plt 233; glucose 134; bun 17; creat 0.74; k+4.8; na++138     Review of Systems  Constitutional: Negative for malaise/fatigue.  Respiratory: Negative for cough and shortness of breath.   Cardiovascular: Negative for chest pain, palpitations and leg swelling.  Gastrointestinal: . Negative for heartburn; constipation and abdominal pain.  Musculoskeletal: no complaint of pain  Skin: Negative.   Neurological: Negative for headaches.  Psychiatric/Behavioral: Negative for depression. The patient is not nervous/anxious.      Physical Exam  Constitutional: She is oriented to person, place, and time. She appears well-developed and well-nourished. No distress.  obese  Neck: Neck supple. No JVD present. No thyromegaly present.  Cardiovascular: Normal rate, regular rhythm and intact distal pulses.   Respiratory: Effort normal and breath sounds normal. No respiratory distress. She has no wheezes.  GI: Soft. Bowel sounds are normal. She exhibits no distension. There is no tenderness.  Musculoskeletal: She exhibits no edema.  Is able to move all extremities; is status post right knee replacement is ambulating with a walker in therapy  Neurological: She is alert and oriented to person, place, and time.  Skin: Skin is warm and dry. She is not diaphoretic.   Psychiatric: She has a normal mood and affect.     ASSESSMENT/ PLAN:  Will discharge her to home with home health for pt/ot; to continue to improve upon her  strength; gait and mobility. Her prescriptions have been written for 30 days supply with xarelto through 04-29-14; ultram 50 mg # 60 tabs and oxycodone 5 mg #40 tabs.  Her follow up appointment has been scheduled with Dr. Alyson Ingles (pcp) 04-25-14 at 11:15 am.    Time spent with patient 40 minutes

## 2014-04-21 DIAGNOSIS — I10 Essential (primary) hypertension: Secondary | ICD-10-CM | POA: Diagnosis not present

## 2014-04-21 DIAGNOSIS — IMO0001 Reserved for inherently not codable concepts without codable children: Secondary | ICD-10-CM | POA: Diagnosis not present

## 2014-04-21 DIAGNOSIS — M479 Spondylosis, unspecified: Secondary | ICD-10-CM | POA: Diagnosis not present

## 2014-04-21 DIAGNOSIS — M1990 Unspecified osteoarthritis, unspecified site: Secondary | ICD-10-CM | POA: Diagnosis not present

## 2014-04-21 DIAGNOSIS — Z471 Aftercare following joint replacement surgery: Secondary | ICD-10-CM | POA: Diagnosis not present

## 2014-04-21 DIAGNOSIS — D649 Anemia, unspecified: Secondary | ICD-10-CM | POA: Diagnosis not present

## 2014-04-23 DIAGNOSIS — D649 Anemia, unspecified: Secondary | ICD-10-CM | POA: Diagnosis not present

## 2014-04-23 DIAGNOSIS — IMO0001 Reserved for inherently not codable concepts without codable children: Secondary | ICD-10-CM | POA: Diagnosis not present

## 2014-04-23 DIAGNOSIS — M1990 Unspecified osteoarthritis, unspecified site: Secondary | ICD-10-CM | POA: Diagnosis not present

## 2014-04-23 DIAGNOSIS — I10 Essential (primary) hypertension: Secondary | ICD-10-CM | POA: Diagnosis not present

## 2014-04-23 DIAGNOSIS — Z471 Aftercare following joint replacement surgery: Secondary | ICD-10-CM | POA: Diagnosis not present

## 2014-04-23 DIAGNOSIS — M479 Spondylosis, unspecified: Secondary | ICD-10-CM | POA: Diagnosis not present

## 2014-04-24 DIAGNOSIS — M479 Spondylosis, unspecified: Secondary | ICD-10-CM | POA: Diagnosis not present

## 2014-04-24 DIAGNOSIS — IMO0001 Reserved for inherently not codable concepts without codable children: Secondary | ICD-10-CM | POA: Diagnosis not present

## 2014-04-24 DIAGNOSIS — I10 Essential (primary) hypertension: Secondary | ICD-10-CM | POA: Diagnosis not present

## 2014-04-24 DIAGNOSIS — Z471 Aftercare following joint replacement surgery: Secondary | ICD-10-CM | POA: Diagnosis not present

## 2014-04-24 DIAGNOSIS — M1990 Unspecified osteoarthritis, unspecified site: Secondary | ICD-10-CM | POA: Diagnosis not present

## 2014-04-24 DIAGNOSIS — D649 Anemia, unspecified: Secondary | ICD-10-CM | POA: Diagnosis not present

## 2014-04-25 DIAGNOSIS — Z96659 Presence of unspecified artificial knee joint: Secondary | ICD-10-CM | POA: Diagnosis not present

## 2014-04-25 DIAGNOSIS — N3000 Acute cystitis without hematuria: Secondary | ICD-10-CM | POA: Diagnosis not present

## 2014-04-26 DIAGNOSIS — Z471 Aftercare following joint replacement surgery: Secondary | ICD-10-CM | POA: Diagnosis not present

## 2014-04-26 DIAGNOSIS — M479 Spondylosis, unspecified: Secondary | ICD-10-CM | POA: Diagnosis not present

## 2014-04-26 DIAGNOSIS — I10 Essential (primary) hypertension: Secondary | ICD-10-CM | POA: Diagnosis not present

## 2014-04-26 DIAGNOSIS — IMO0001 Reserved for inherently not codable concepts without codable children: Secondary | ICD-10-CM | POA: Diagnosis not present

## 2014-04-26 DIAGNOSIS — M1990 Unspecified osteoarthritis, unspecified site: Secondary | ICD-10-CM | POA: Diagnosis not present

## 2014-04-26 DIAGNOSIS — D649 Anemia, unspecified: Secondary | ICD-10-CM | POA: Diagnosis not present

## 2014-04-30 DIAGNOSIS — M25569 Pain in unspecified knee: Secondary | ICD-10-CM | POA: Diagnosis not present

## 2014-05-02 DIAGNOSIS — M25569 Pain in unspecified knee: Secondary | ICD-10-CM | POA: Diagnosis not present

## 2014-05-04 DIAGNOSIS — M25569 Pain in unspecified knee: Secondary | ICD-10-CM | POA: Diagnosis not present

## 2014-05-07 DIAGNOSIS — M25569 Pain in unspecified knee: Secondary | ICD-10-CM | POA: Diagnosis not present

## 2014-05-08 DIAGNOSIS — Z96659 Presence of unspecified artificial knee joint: Secondary | ICD-10-CM | POA: Diagnosis not present

## 2014-05-08 DIAGNOSIS — Z471 Aftercare following joint replacement surgery: Secondary | ICD-10-CM | POA: Diagnosis not present

## 2014-05-09 DIAGNOSIS — M25569 Pain in unspecified knee: Secondary | ICD-10-CM | POA: Diagnosis not present

## 2014-05-11 DIAGNOSIS — M25569 Pain in unspecified knee: Secondary | ICD-10-CM | POA: Diagnosis not present

## 2014-05-14 DIAGNOSIS — M25569 Pain in unspecified knee: Secondary | ICD-10-CM | POA: Diagnosis not present

## 2014-05-17 DIAGNOSIS — M25569 Pain in unspecified knee: Secondary | ICD-10-CM | POA: Diagnosis not present

## 2014-05-21 DIAGNOSIS — M25569 Pain in unspecified knee: Secondary | ICD-10-CM | POA: Diagnosis not present

## 2014-05-24 DIAGNOSIS — M25569 Pain in unspecified knee: Secondary | ICD-10-CM | POA: Diagnosis not present

## 2014-05-28 DIAGNOSIS — M25569 Pain in unspecified knee: Secondary | ICD-10-CM | POA: Diagnosis not present

## 2014-05-31 DIAGNOSIS — M25569 Pain in unspecified knee: Secondary | ICD-10-CM | POA: Diagnosis not present

## 2014-06-06 DIAGNOSIS — M25569 Pain in unspecified knee: Secondary | ICD-10-CM | POA: Diagnosis not present

## 2014-06-08 DIAGNOSIS — M25569 Pain in unspecified knee: Secondary | ICD-10-CM | POA: Diagnosis not present

## 2014-06-11 DIAGNOSIS — M25569 Pain in unspecified knee: Secondary | ICD-10-CM | POA: Diagnosis not present

## 2014-06-12 DIAGNOSIS — N952 Postmenopausal atrophic vaginitis: Secondary | ICD-10-CM | POA: Diagnosis not present

## 2014-06-14 DIAGNOSIS — M25569 Pain in unspecified knee: Secondary | ICD-10-CM | POA: Diagnosis not present

## 2014-07-11 DIAGNOSIS — E780 Pure hypercholesterolemia: Secondary | ICD-10-CM | POA: Diagnosis not present

## 2014-07-11 DIAGNOSIS — Z23 Encounter for immunization: Secondary | ICD-10-CM | POA: Diagnosis not present

## 2014-07-11 DIAGNOSIS — I10 Essential (primary) hypertension: Secondary | ICD-10-CM | POA: Diagnosis not present

## 2014-07-11 DIAGNOSIS — Z0000 Encounter for general adult medical examination without abnormal findings: Secondary | ICD-10-CM | POA: Diagnosis not present

## 2014-07-11 DIAGNOSIS — R609 Edema, unspecified: Secondary | ICD-10-CM | POA: Diagnosis not present

## 2014-07-11 DIAGNOSIS — E119 Type 2 diabetes mellitus without complications: Secondary | ICD-10-CM | POA: Diagnosis not present

## 2014-07-11 DIAGNOSIS — M545 Low back pain: Secondary | ICD-10-CM | POA: Diagnosis not present

## 2014-07-11 DIAGNOSIS — E039 Hypothyroidism, unspecified: Secondary | ICD-10-CM | POA: Diagnosis not present

## 2014-07-11 DIAGNOSIS — K219 Gastro-esophageal reflux disease without esophagitis: Secondary | ICD-10-CM | POA: Diagnosis not present

## 2014-08-06 ENCOUNTER — Ambulatory Visit (INDEPENDENT_AMBULATORY_CARE_PROVIDER_SITE_OTHER): Payer: Medicare Other | Admitting: Cardiology

## 2014-08-06 ENCOUNTER — Encounter: Payer: Self-pay | Admitting: Cardiology

## 2014-08-06 VITALS — BP 102/68 | HR 78 | Ht 60.0 in | Wt 243.8 lb

## 2014-08-06 DIAGNOSIS — Z6841 Body Mass Index (BMI) 40.0 and over, adult: Secondary | ICD-10-CM | POA: Diagnosis not present

## 2014-08-06 DIAGNOSIS — I10 Essential (primary) hypertension: Secondary | ICD-10-CM | POA: Diagnosis not present

## 2014-08-06 DIAGNOSIS — G4733 Obstructive sleep apnea (adult) (pediatric): Secondary | ICD-10-CM

## 2014-08-06 DIAGNOSIS — Z9989 Dependence on other enabling machines and devices: Principal | ICD-10-CM

## 2014-08-06 NOTE — Progress Notes (Signed)
[facility], [place],   [address] Phone: [phone] Fax:  [phone]  Date:  08/06/2014   ID:  Melinda Kerr, DOB 09/27/1935, MRN [id number]  PCP:  [name], MD  Cardiologist:  Fransico Him, MD    History of Present Illness: Melinda Kerr is a 79 y.o. female with a history of obesity, OSA on CPAP and HTN who presents today for followup. She is doing well. She uses the full face mask which she tolerates well. She has not problems with nasal congestion or nasal dryness.  She feels the pressure is adequate. She feels rested when she gets up in the am and has no daytime sleepiness.  Her d/l shows an AHI of 2.3/hr on 12cm H2O and 49% compliance in using more than 4 hours nightly.   Wt Readings from Last 3 Encounters:  08/06/14 243 lb 12.8 oz (110.587 kg)  04/19/14 246 lb (111.585 kg)  04/13/14 246 lb 9.6 oz (111.857 kg)     Past Medical History  Diagnosis Date  . Hyperlipidemia     under control  . Depression   . OSA on CPAP   . Spondylosis     with scoliosis  . Phlebitis     hx of  . Esophageal reflux   . Morbid obesity with BMI of 40.0-44.9, adult   . Hypertension   . Osteoarthritis   . Prediabetes     under control with diet    Current Outpatient Prescriptions  Medication Sig Dispense Refill  . acetaminophen (TYLENOL) 500 MG tablet Take 1,000 mg by mouth 2 (two) times daily.     Marland Kitchen allopurinol (ZYLOPRIM) 300 MG tablet Take 300 mg by mouth daily with breakfast.     . alum & mag hydroxide-simeth (MAALOX/MYLANTA) 200-200-20 MG/5ML suspension Take 30 mLs by mouth every 4 (four) hours as needed for indigestion or heartburn. 355 mL 0  . cetirizine (ZYRTEC) 10 MG tablet Take 10 mg by mouth daily.    . citalopram (CELEXA) 20 MG tablet Take 20 mg by mouth at bedtime.     . furosemide (LASIX) 40 MG tablet Take 20 mg by mouth every morning.     . hydrOXYzine (ATARAX/VISTARIL) 25 MG tablet Take 25 mg by mouth at bedtime.    Marland Kitchen levothyroxine  (SYNTHROID, LEVOTHROID) 125 MCG tablet Take 125 mcg by mouth daily before breakfast.    . lisinopril (PRINIVIL,ZESTRIL) 20 MG tablet Take 20 mg by mouth daily with breakfast.     . methocarbamol (ROBAXIN) 500 MG tablet Take 1 tablet (500 mg total) by mouth every 6 (six) hours as needed for muscle spasms. 80 tablet 0  . omeprazole (PRILOSEC OTC) 20 MG tablet Take 20 mg by mouth daily.    Marland Kitchen OVER THE COUNTER MEDICATION Take 1 tablet by mouth daily. Iron supplement    . polyethylene glycol (MIRALAX / GLYCOLAX) packet Take 17 g by mouth daily as needed for mild constipation or moderate constipation. 14 each 0  . ranitidine (ZANTAC) 300 MG tablet Take 300 mg by mouth at bedtime.    . simvastatin (ZOCOR) 40 MG tablet Take 20 mg by mouth at bedtime.    . tolterodine (DETROL LA) 4 MG 24 hr capsule Take 4 mg by mouth daily.    . traMADol (ULTRAM) 50 MG tablet Take 1-2 tablets (50-100 mg total) by mouth every 6 (six) hours as needed (mild pain). 80 tablet 1   No current facility-administered medications for this visit.  Allergies:    Allergies  Allergen Reactions  . Oxybutynin     Bleeding gums, felt sick    Social History:  The patient  reports that she has never smoked. She has never used smokeless tobacco. She reports that she does not drink alcohol or use illicit drugs.   Family History:  The patient's family history includes CVA in her father; Heart disease in her father and mother.   ROS:  Please see the history of present illness.      All other systems reviewed and negative.   PHYSICAL EXAM: VS:  BP 102/68 mmHg  Pulse 78  Ht 5' (1.524 m)  Wt 243 lb 12.8 oz (110.587 kg)  BMI 47.61 kg/m2 Well nourished, well developed, in no acute distress HEENT: normal Neck: no JVD Cardiac:  normal S1, S2; RRR; no murmur Lungs:  clear to auscultation bilaterally, no wheezing, rhonchi or rales Abd: soft, nontender, no hepatomegaly Ext: trace LLE edema Skin: warm and dry Neuro:  CNs 2-12 intact,  no focal abnormalities noted    ASSESSMENT AND PLAN:  1. Severe OSA on CPAP and tolerating well.  - continue on current CPAP setting  2. HTN - controlled  - continue Lisinopril  3. Morbid obesity  - her exercise is limited by leg pain but she does go to water aerobics  Followup with me in 6 months      Signed, Fransico Him, MD [facility] 08/06/2014 2:44 PM

## 2014-08-06 NOTE — Patient Instructions (Signed)
Your physician recommends that you continue on your current medications as directed. Please refer to the Current Medication list given to you today.  Your physician wants you to follow-up in: 6 months with Dr Turner You will receive a reminder letter in the mail two months in advance. If you don't receive a letter, please call our office to schedule the follow-up appointment.  

## 2014-08-08 ENCOUNTER — Encounter: Payer: Self-pay | Admitting: Cardiology

## 2014-09-13 DIAGNOSIS — Z1231 Encounter for screening mammogram for malignant neoplasm of breast: Secondary | ICD-10-CM | POA: Diagnosis not present

## 2014-10-02 DIAGNOSIS — N390 Urinary tract infection, site not specified: Secondary | ICD-10-CM | POA: Diagnosis not present

## 2014-10-10 DIAGNOSIS — N390 Urinary tract infection, site not specified: Secondary | ICD-10-CM | POA: Diagnosis not present

## 2014-10-16 DIAGNOSIS — Z471 Aftercare following joint replacement surgery: Secondary | ICD-10-CM | POA: Diagnosis not present

## 2014-10-16 DIAGNOSIS — Z96652 Presence of left artificial knee joint: Secondary | ICD-10-CM | POA: Diagnosis not present

## 2014-10-16 DIAGNOSIS — Z96651 Presence of right artificial knee joint: Secondary | ICD-10-CM | POA: Diagnosis not present

## 2014-11-07 DIAGNOSIS — D485 Neoplasm of uncertain behavior of skin: Secondary | ICD-10-CM | POA: Diagnosis not present

## 2014-11-12 ENCOUNTER — Encounter: Payer: Self-pay | Admitting: Cardiology

## 2014-11-19 DIAGNOSIS — D487 Neoplasm of uncertain behavior of other specified sites: Secondary | ICD-10-CM | POA: Diagnosis not present

## 2014-12-18 DIAGNOSIS — L723 Sebaceous cyst: Secondary | ICD-10-CM | POA: Diagnosis not present

## 2015-01-10 DIAGNOSIS — Z6841 Body Mass Index (BMI) 40.0 and over, adult: Secondary | ICD-10-CM | POA: Diagnosis not present

## 2015-01-10 DIAGNOSIS — E780 Pure hypercholesterolemia: Secondary | ICD-10-CM | POA: Diagnosis not present

## 2015-01-10 DIAGNOSIS — M109 Gout, unspecified: Secondary | ICD-10-CM | POA: Diagnosis not present

## 2015-01-10 DIAGNOSIS — K219 Gastro-esophageal reflux disease without esophagitis: Secondary | ICD-10-CM | POA: Diagnosis not present

## 2015-01-10 DIAGNOSIS — I10 Essential (primary) hypertension: Secondary | ICD-10-CM | POA: Diagnosis not present

## 2015-01-10 DIAGNOSIS — E1129 Type 2 diabetes mellitus with other diabetic kidney complication: Secondary | ICD-10-CM | POA: Diagnosis not present

## 2015-01-10 DIAGNOSIS — M545 Low back pain: Secondary | ICD-10-CM | POA: Diagnosis not present

## 2015-01-10 DIAGNOSIS — N3281 Overactive bladder: Secondary | ICD-10-CM | POA: Diagnosis not present

## 2015-01-10 DIAGNOSIS — R609 Edema, unspecified: Secondary | ICD-10-CM | POA: Diagnosis not present

## 2015-01-10 DIAGNOSIS — G4700 Insomnia, unspecified: Secondary | ICD-10-CM | POA: Diagnosis not present

## 2015-02-12 ENCOUNTER — Ambulatory Visit: Payer: No Typology Code available for payment source | Admitting: Cardiology

## 2015-02-14 DIAGNOSIS — H2513 Age-related nuclear cataract, bilateral: Secondary | ICD-10-CM | POA: Diagnosis not present

## 2015-02-14 DIAGNOSIS — H1850 Unspecified hereditary corneal dystrophies: Secondary | ICD-10-CM | POA: Diagnosis not present

## 2015-03-26 ENCOUNTER — Encounter: Payer: Self-pay | Admitting: Cardiology

## 2015-03-26 ENCOUNTER — Ambulatory Visit (INDEPENDENT_AMBULATORY_CARE_PROVIDER_SITE_OTHER): Payer: Medicare Other | Admitting: Cardiology

## 2015-03-26 VITALS — BP 114/68 | HR 85 | Ht 60.0 in | Wt 254.4 lb

## 2015-03-26 DIAGNOSIS — Z6841 Body Mass Index (BMI) 40.0 and over, adult: Secondary | ICD-10-CM

## 2015-03-26 DIAGNOSIS — G4733 Obstructive sleep apnea (adult) (pediatric): Secondary | ICD-10-CM | POA: Diagnosis not present

## 2015-03-26 DIAGNOSIS — Z9989 Dependence on other enabling machines and devices: Principal | ICD-10-CM

## 2015-03-26 DIAGNOSIS — I10 Essential (primary) hypertension: Secondary | ICD-10-CM | POA: Diagnosis not present

## 2015-03-26 NOTE — Progress Notes (Signed)
Cardiology Office Note   Date:  03/26/2015   ID:  Melinda Kerr, DOB 1935/08/28, MRN [id number]  PCP:  Melinda Austria, MD    Chief Complaint  Patient presents with  . Follow-up    OSA      History of Present Illness: Melinda Kerr is a 80 y.o. female with a history of obesity, OSA on CPAP and HTN who presents today for followup. She is doing well. She uses the full face mask which she tolerates well. She has not problems with nasal dryness. She has had some sinus congestion due to allergies this spring and summer.  She feels the pressure is adequate. She feels rested when she gets up in the am and has no daytime sleepiness.  Past Medical History  Diagnosis Date  . Hyperlipidemia     under control  . Depression   . OSA on CPAP   . Spondylosis     with scoliosis  . Phlebitis     hx of  . Esophageal reflux   . Morbid obesity with BMI of 40.0-44.9, adult   . Hypertension   . Osteoarthritis   . Prediabetes     under control with diet    Past Surgical History  Procedure Laterality Date  . Tubal ligation  age 65  . Varicose vein surgery Bilateral age 18  . Wisdom tooth extraction  age 3  . Hernia repair  age 63    umblical  . Cholecystectomy  age 23  . Total knee arthroplasty Left 10/09/2013    Procedure: LEFT TOTAL KNEE ARTHROPLASTY;  Surgeon: [name], MD;  Location: [facility];  Service: Orthopedics;  Laterality: Left;  . Total knee arthroplasty Right 04/09/2014    Procedure: RIGHT TOTAL KNEE ARTHROPLASTY;  Surgeon: [name], MD;  Location: [facility];  Service: Orthopedics;  Laterality: Right;     Current Outpatient Prescriptions  Medication Sig Dispense Refill  . acetaminophen (TYLENOL) 500 MG tablet Take 1,000 mg by mouth 2 (two) times daily.     Marland Kitchen allopurinol (ZYLOPRIM) 300 MG tablet Take 300 mg by mouth daily with breakfast.     . cetirizine (ZYRTEC) 10 MG tablet Take 10 mg by mouth daily.    . citalopram (CELEXA) 10 MG  tablet Take 10 mg by mouth daily.    . furosemide (LASIX) 20 MG tablet Take 20 mg by mouth daily.    . hydrOXYzine (ATARAX/VISTARIL) 25 MG tablet Take 25 mg by mouth at bedtime.    Marland Kitchen levothyroxine (SYNTHROID, LEVOTHROID) 125 MCG tablet Take 125 mcg by mouth daily before breakfast.    . lisinopril (PRINIVIL,ZESTRIL) 20 MG tablet Take 20 mg by mouth daily with breakfast.     . methocarbamol (ROBAXIN) 500 MG tablet Take 1 tablet (500 mg total) by mouth every 6 (six) hours as needed for muscle spasms. 80 tablet 0  . omeprazole (PRILOSEC OTC) 20 MG tablet Take 20 mg by mouth daily.    Marland Kitchen omeprazole (PRILOSEC) 20 MG capsule Take 20 mg by mouth daily.    Marland Kitchen OVER THE COUNTER MEDICATION Take 1 tablet by mouth daily. Iron supplement    . polyethylene glycol (MIRALAX / GLYCOLAX) packet Take 17 g by mouth daily as needed for mild constipation or moderate constipation. 14 each 0  . ranitidine (ZANTAC) 300 MG tablet Take 300 mg by mouth at bedtime.    . simvastatin (ZOCOR) 20  MG tablet Take 20 mg by mouth daily.    . simvastatin (ZOCOR) 40 MG tablet Take 20 mg by mouth at bedtime.    . tolterodine (DETROL LA) 4 MG 24 hr capsule Take 4 mg by mouth daily.    . traMADol (ULTRAM) 50 MG tablet Take 1-2 tablets (50-100 mg total) by mouth every 6 (six) hours as needed (mild pain). 80 tablet 1   No current facility-administered medications for this visit.    Allergies:   Oxybutynin    Social History:  The patient  reports that she has never smoked. She has never used smokeless tobacco. She reports that she does not drink alcohol or use illicit drugs.   Family History:  The patient's family history includes CVA in her father; Heart disease in her father and mother.    ROS:  Please see the history of present illness.   Otherwise, review of systems are positive for none.   All other systems are reviewed and negative.    PHYSICAL EXAM: VS:  BP 114/68 mmHg  Pulse 85  Ht 5' (1.524 m)  Wt 254 lb 6.4 oz (115.395  kg)  BMI 49.68 kg/m2  SpO2 94% , BMI Body mass index is 49.68 kg/(m^2). GEN: Well nourished, well developed, in no acute distress HEENT: normal Neck: no JVD, carotid bruits, or masses Cardiac: RRR; no murmurs, rubs, or gallops,no edema  Respiratory:  clear to auscultation bilaterally, normal work of breathing GI: soft, nontender, nondistended, + BS MS: no deformity or atrophy Skin: warm and dry, no rash Neuro:  Strength and sensation are intact Psych: euthymic mood, full affect   EKG:  EKG is not ordered today.    Recent Labs: 04/02/2014: ALT 13 04/11/2014: BUN 17; Creatinine, Ser 0.74; Potassium 4.8; Sodium 138 04/12/2014: Hemoglobin 10.8*; Platelets 245    Lipid Panel No results found for: CHOL, TRIG, HDL, CHOLHDL, VLDL, LDLCALC, LDLDIRECT    Wt Readings from Last 3 Encounters:  03/26/15 254 lb 6.4 oz (115.395 kg)  08/06/14 243 lb 12.8 oz (110.587 kg)  04/19/14 246 lb (111.585 kg)    ASSESSMENT AND PLAN:  1. Severe OSA on CPAP and tolerating well.  - continue on current CPAP setting  - I will get a CPAP d/l from her DME - She sleeps in a lift chair due to her knee problems 2. HTN - controlled  - continue Lisinopril  3. Morbid obesity - she has gained 11 lbs since the fall - her exercise is limited by leg pain but she does go to water aerobics.  She has been eating a lot of ice cream lately and we discussed needing to follow a low fat diet.     Current medicines are reviewed at length with the patient today.  The patient does not have concerns regarding medicines.  The following changes have been made:  no change  Labs/ tests ordered today: See above Assessment and Plan No orders of the defined types were placed in this encounter.     Disposition:   FU with me in 1 year  Signed, Melinda Margarita, MD  03/26/2015 2:49 PM    [address], [place], [place]  [address] Phone: [phone]; Fax: [phone]

## 2015-03-26 NOTE — Patient Instructions (Signed)
Medication Instructions:  Your physician recommends that you continue on your current medications as directed. Please refer to the Current Medication list given to you today.   Labwork: None  Testing/Procedures: None  Follow-Up: Your physician wants you to follow-up in: 1 year with Dr. Radford Pax. You will receive a reminder letter in the mail two months in advance. If you don't receive a letter, please call our office to schedule the follow-up appointment.   Any Other Special Instructions Will Be Listed Below (If Applicable). Please go to [place] at the end of July with your chip so we can get a download.

## 2015-06-04 DIAGNOSIS — Z96652 Presence of left artificial knee joint: Secondary | ICD-10-CM | POA: Diagnosis not present

## 2015-06-04 DIAGNOSIS — Z96651 Presence of right artificial knee joint: Secondary | ICD-10-CM | POA: Diagnosis not present

## 2015-06-04 DIAGNOSIS — Z471 Aftercare following joint replacement surgery: Secondary | ICD-10-CM | POA: Diagnosis not present

## 2015-07-15 DIAGNOSIS — K219 Gastro-esophageal reflux disease without esophagitis: Secondary | ICD-10-CM | POA: Diagnosis not present

## 2015-07-15 DIAGNOSIS — Z6841 Body Mass Index (BMI) 40.0 and over, adult: Secondary | ICD-10-CM | POA: Diagnosis not present

## 2015-07-15 DIAGNOSIS — F329 Major depressive disorder, single episode, unspecified: Secondary | ICD-10-CM | POA: Diagnosis not present

## 2015-07-15 DIAGNOSIS — Z23 Encounter for immunization: Secondary | ICD-10-CM | POA: Diagnosis not present

## 2015-07-15 DIAGNOSIS — N183 Chronic kidney disease, stage 3 (moderate): Secondary | ICD-10-CM | POA: Diagnosis not present

## 2015-07-15 DIAGNOSIS — Z0000 Encounter for general adult medical examination without abnormal findings: Secondary | ICD-10-CM | POA: Diagnosis not present

## 2015-07-15 DIAGNOSIS — N3281 Overactive bladder: Secondary | ICD-10-CM | POA: Diagnosis not present

## 2015-07-15 DIAGNOSIS — E7800 Pure hypercholesterolemia, unspecified: Secondary | ICD-10-CM | POA: Diagnosis not present

## 2015-07-15 DIAGNOSIS — E039 Hypothyroidism, unspecified: Secondary | ICD-10-CM | POA: Diagnosis not present

## 2015-07-15 DIAGNOSIS — E1129 Type 2 diabetes mellitus with other diabetic kidney complication: Secondary | ICD-10-CM | POA: Diagnosis not present

## 2015-07-15 DIAGNOSIS — I129 Hypertensive chronic kidney disease with stage 1 through stage 4 chronic kidney disease, or unspecified chronic kidney disease: Secondary | ICD-10-CM | POA: Diagnosis not present

## 2015-08-05 DIAGNOSIS — E860 Dehydration: Secondary | ICD-10-CM | POA: Diagnosis not present

## 2015-08-06 DIAGNOSIS — G8929 Other chronic pain: Secondary | ICD-10-CM | POA: Diagnosis not present

## 2015-08-06 DIAGNOSIS — M545 Low back pain: Secondary | ICD-10-CM | POA: Diagnosis not present

## 2015-08-09 ENCOUNTER — Other Ambulatory Visit: Payer: Self-pay | Admitting: Neurological Surgery

## 2015-08-09 DIAGNOSIS — G8929 Other chronic pain: Secondary | ICD-10-CM

## 2015-08-09 DIAGNOSIS — M545 Low back pain: Principal | ICD-10-CM

## 2015-08-19 ENCOUNTER — Ambulatory Visit
Admission: RE | Admit: 2015-08-19 | Discharge: 2015-08-19 | Disposition: A | Discharge status: Home / Self Care | Payer: Medicare Other | Source: Ambulatory Visit | Attending: Neurological Surgery | Admitting: Neurological Surgery

## 2015-08-19 DIAGNOSIS — G8929 Other chronic pain: Secondary | ICD-10-CM

## 2015-08-19 DIAGNOSIS — M545 Low back pain: Principal | ICD-10-CM

## 2015-08-19 DIAGNOSIS — M4806 Spinal stenosis, lumbar region: Secondary | ICD-10-CM | POA: Diagnosis not present

## 2015-09-13 DIAGNOSIS — G8929 Other chronic pain: Secondary | ICD-10-CM | POA: Diagnosis not present

## 2015-09-18 DIAGNOSIS — Z1231 Encounter for screening mammogram for malignant neoplasm of breast: Secondary | ICD-10-CM | POA: Diagnosis not present

## 2015-10-24 DIAGNOSIS — M47816 Spondylosis without myelopathy or radiculopathy, lumbar region: Secondary | ICD-10-CM | POA: Diagnosis not present

## 2015-10-24 DIAGNOSIS — G8929 Other chronic pain: Secondary | ICD-10-CM | POA: Diagnosis not present

## 2015-10-30 DIAGNOSIS — G8929 Other chronic pain: Secondary | ICD-10-CM | POA: Diagnosis not present

## 2015-10-30 DIAGNOSIS — M47816 Spondylosis without myelopathy or radiculopathy, lumbar region: Secondary | ICD-10-CM | POA: Diagnosis not present

## 2015-10-30 DIAGNOSIS — Z6841 Body Mass Index (BMI) 40.0 and over, adult: Secondary | ICD-10-CM | POA: Diagnosis not present

## 2015-11-14 DIAGNOSIS — M545 Low back pain: Secondary | ICD-10-CM | POA: Diagnosis not present

## 2015-11-14 DIAGNOSIS — M47816 Spondylosis without myelopathy or radiculopathy, lumbar region: Secondary | ICD-10-CM | POA: Diagnosis not present

## 2015-12-10 DIAGNOSIS — M47816 Spondylosis without myelopathy or radiculopathy, lumbar region: Secondary | ICD-10-CM | POA: Diagnosis not present

## 2015-12-10 DIAGNOSIS — M545 Low back pain: Secondary | ICD-10-CM | POA: Diagnosis not present

## 2016-01-13 DIAGNOSIS — N3281 Overactive bladder: Secondary | ICD-10-CM | POA: Diagnosis not present

## 2016-01-13 DIAGNOSIS — M545 Low back pain: Secondary | ICD-10-CM | POA: Diagnosis not present

## 2016-01-13 DIAGNOSIS — Z6841 Body Mass Index (BMI) 40.0 and over, adult: Secondary | ICD-10-CM | POA: Diagnosis not present

## 2016-01-13 DIAGNOSIS — K219 Gastro-esophageal reflux disease without esophagitis: Secondary | ICD-10-CM | POA: Diagnosis not present

## 2016-01-13 DIAGNOSIS — R609 Edema, unspecified: Secondary | ICD-10-CM | POA: Diagnosis not present

## 2016-01-13 DIAGNOSIS — E1129 Type 2 diabetes mellitus with other diabetic kidney complication: Secondary | ICD-10-CM | POA: Diagnosis not present

## 2016-01-13 DIAGNOSIS — E7800 Pure hypercholesterolemia, unspecified: Secondary | ICD-10-CM | POA: Diagnosis not present

## 2016-01-13 DIAGNOSIS — G4700 Insomnia, unspecified: Secondary | ICD-10-CM | POA: Diagnosis not present

## 2016-01-13 DIAGNOSIS — I129 Hypertensive chronic kidney disease with stage 1 through stage 4 chronic kidney disease, or unspecified chronic kidney disease: Secondary | ICD-10-CM | POA: Diagnosis not present

## 2016-01-13 DIAGNOSIS — M109 Gout, unspecified: Secondary | ICD-10-CM | POA: Diagnosis not present

## 2016-01-13 DIAGNOSIS — E039 Hypothyroidism, unspecified: Secondary | ICD-10-CM | POA: Diagnosis not present

## 2016-02-12 DIAGNOSIS — Z0100 Encounter for examination of eyes and vision without abnormal findings: Secondary | ICD-10-CM | POA: Diagnosis not present

## 2016-02-12 DIAGNOSIS — H2513 Age-related nuclear cataract, bilateral: Secondary | ICD-10-CM | POA: Diagnosis not present

## 2016-02-12 DIAGNOSIS — H1851 Endothelial corneal dystrophy: Secondary | ICD-10-CM | POA: Diagnosis not present

## 2016-02-13 DIAGNOSIS — N950 Postmenopausal bleeding: Secondary | ICD-10-CM | POA: Diagnosis not present

## 2016-02-13 DIAGNOSIS — R938 Abnormal findings on diagnostic imaging of other specified body structures: Secondary | ICD-10-CM | POA: Diagnosis not present

## 2016-02-18 DIAGNOSIS — N950 Postmenopausal bleeding: Secondary | ICD-10-CM | POA: Diagnosis not present

## 2016-02-18 DIAGNOSIS — R938 Abnormal findings on diagnostic imaging of other specified body structures: Secondary | ICD-10-CM | POA: Diagnosis not present

## 2016-02-26 DIAGNOSIS — N882 Stricture and stenosis of cervix uteri: Secondary | ICD-10-CM | POA: Diagnosis not present

## 2016-02-26 DIAGNOSIS — N950 Postmenopausal bleeding: Secondary | ICD-10-CM | POA: Diagnosis not present

## 2016-02-26 DIAGNOSIS — R938 Abnormal findings on diagnostic imaging of other specified body structures: Secondary | ICD-10-CM | POA: Diagnosis not present

## 2016-03-02 ENCOUNTER — Other Ambulatory Visit: Payer: Self-pay | Admitting: Obstetrics & Gynecology

## 2016-03-02 NOTE — Patient Instructions (Signed)
Your procedure is scheduled on:  Wednesday, March 04, 2016  Enter through the [employer] [facility] at:  7:30 am  Pick up the phone at the desk and dial [phone].  Call this number if you have problems the morning of surgery: [phone].  Remember: Do NOT eat food or drink after:  Midnight Tonight  Take these medicines the morning of surgery with a SIP OF WATER:  Lisinopril, Levothyroxine, Omeprazole, Simvastatin, Celexa  Do NOT wear jewelry (body piercing), metal hair clips/bobby pins, make-up, or nail polish. Do NOT wear lotions, powders, or perfumes.  You may wear deodorant. Do NOT shave for 48 hours prior to surgery. Do NOT bring valuables to the hospital. Contacts, dentures, or bridgework may not be worn into surgery.  Have a responsible adult drive you home and stay with you for 24 hours after your procedure

## 2016-03-03 ENCOUNTER — Encounter (HOSPITAL_COMMUNITY)
Admission: RE | Admit: 2016-03-03 | Discharge: 2016-03-03 | Disposition: A | Discharge status: Home / Self Care | Payer: Medicare Other | Source: Ambulatory Visit | Attending: Obstetrics & Gynecology | Admitting: Obstetrics & Gynecology

## 2016-03-03 ENCOUNTER — Encounter (HOSPITAL_COMMUNITY): Payer: Self-pay

## 2016-03-03 DIAGNOSIS — R7303 Prediabetes: Secondary | ICD-10-CM | POA: Diagnosis not present

## 2016-03-03 DIAGNOSIS — I10 Essential (primary) hypertension: Secondary | ICD-10-CM | POA: Diagnosis not present

## 2016-03-03 DIAGNOSIS — Z823 Family history of stroke: Secondary | ICD-10-CM | POA: Diagnosis not present

## 2016-03-03 DIAGNOSIS — G4733 Obstructive sleep apnea (adult) (pediatric): Secondary | ICD-10-CM | POA: Diagnosis not present

## 2016-03-03 DIAGNOSIS — M4802 Spinal stenosis, cervical region: Secondary | ICD-10-CM | POA: Diagnosis not present

## 2016-03-03 DIAGNOSIS — E039 Hypothyroidism, unspecified: Secondary | ICD-10-CM | POA: Diagnosis not present

## 2016-03-03 DIAGNOSIS — K219 Gastro-esophageal reflux disease without esophagitis: Secondary | ICD-10-CM | POA: Diagnosis not present

## 2016-03-03 DIAGNOSIS — N950 Postmenopausal bleeding: Secondary | ICD-10-CM | POA: Diagnosis not present

## 2016-03-03 DIAGNOSIS — Z6841 Body Mass Index (BMI) 40.0 and over, adult: Secondary | ICD-10-CM | POA: Diagnosis not present

## 2016-03-03 DIAGNOSIS — E785 Hyperlipidemia, unspecified: Secondary | ICD-10-CM | POA: Diagnosis not present

## 2016-03-03 LAB — COMPREHENSIVE METABOLIC PANEL
ALK PHOS: 95 U/L (ref 38–126)
ALT: 16 U/L (ref 14–54)
ANION GAP: 7 (ref 5–15)
AST: 24 U/L (ref 15–41)
Albumin: 3.9 g/dL (ref 3.5–5.0)
BILIRUBIN TOTAL: 0.5 mg/dL (ref 0.3–1.2)
BUN: 29 mg/dL — ABNORMAL HIGH (ref 6–20)
CO2: 27 mmol/L (ref 22–32)
Calcium: 9.8 mg/dL (ref 8.9–10.3)
Chloride: 102 mmol/L (ref 101–111)
Creatinine, Ser: 1.04 mg/dL — ABNORMAL HIGH (ref 0.44–1.00)
GFR calc non Af Amer: 50 mL/min — ABNORMAL LOW (ref >60)
GFR, EST AFRICAN AMERICAN: 58 mL/min — AB (ref >60)
Glucose, Bld: 118 mg/dL — ABNORMAL HIGH (ref 65–99)
POTASSIUM: 4.9 mmol/L (ref 3.5–5.1)
SODIUM: 136 mmol/L (ref 135–145)
Total Protein: 6.6 g/dL (ref 6.5–8.1)

## 2016-03-03 LAB — SURGICAL PCR SCREEN
MRSA, PCR: NEGATIVE
STAPHYLOCOCCUS AUREUS: NEGATIVE

## 2016-03-03 LAB — CBC
HEMATOCRIT: 38.7 % (ref 36.0–46.0)
Hemoglobin: 12.7 g/dL (ref 12.0–15.0)
MCH: 30.8 pg (ref 26.0–34.0)
MCHC: 32.8 g/dL (ref 30.0–36.0)
MCV: 93.9 fL (ref 78.0–100.0)
Platelets: 296 10*3/uL (ref 150–400)
RBC: 4.12 MIL/uL (ref 3.87–5.11)
RDW: 14.4 % (ref 11.5–15.5)
WBC: 12.7 10*3/uL — AB (ref 4.0–10.5)

## 2016-03-04 ENCOUNTER — Ambulatory Visit (HOSPITAL_COMMUNITY): Payer: Medicare Other | Admitting: Anesthesiology

## 2016-03-04 ENCOUNTER — Ambulatory Visit (HOSPITAL_COMMUNITY)
Admission: RE | Admit: 2016-03-04 | Discharge: 2016-03-04 | Disposition: A | Discharge status: Home / Self Care | Payer: Medicare Other | Source: Ambulatory Visit | Attending: Obstetrics & Gynecology | Admitting: Obstetrics & Gynecology

## 2016-03-04 ENCOUNTER — Encounter (HOSPITAL_COMMUNITY)
Admission: RE | Disposition: A | Discharge status: Home / Self Care | Payer: Self-pay | Source: Ambulatory Visit | Attending: Obstetrics & Gynecology

## 2016-03-04 ENCOUNTER — Encounter (HOSPITAL_COMMUNITY): Payer: Self-pay

## 2016-03-04 DIAGNOSIS — K219 Gastro-esophageal reflux disease without esophagitis: Secondary | ICD-10-CM | POA: Insufficient documentation

## 2016-03-04 DIAGNOSIS — E785 Hyperlipidemia, unspecified: Secondary | ICD-10-CM | POA: Insufficient documentation

## 2016-03-04 DIAGNOSIS — G4733 Obstructive sleep apnea (adult) (pediatric): Secondary | ICD-10-CM | POA: Insufficient documentation

## 2016-03-04 DIAGNOSIS — M4802 Spinal stenosis, cervical region: Secondary | ICD-10-CM | POA: Insufficient documentation

## 2016-03-04 DIAGNOSIS — Z6841 Body Mass Index (BMI) 40.0 and over, adult: Secondary | ICD-10-CM | POA: Diagnosis not present

## 2016-03-04 DIAGNOSIS — N950 Postmenopausal bleeding: Secondary | ICD-10-CM | POA: Diagnosis not present

## 2016-03-04 DIAGNOSIS — E039 Hypothyroidism, unspecified: Secondary | ICD-10-CM | POA: Insufficient documentation

## 2016-03-04 DIAGNOSIS — C541 Malignant neoplasm of endometrium: Secondary | ICD-10-CM | POA: Diagnosis not present

## 2016-03-04 DIAGNOSIS — R7303 Prediabetes: Secondary | ICD-10-CM | POA: Insufficient documentation

## 2016-03-04 DIAGNOSIS — Z823 Family history of stroke: Secondary | ICD-10-CM | POA: Insufficient documentation

## 2016-03-04 DIAGNOSIS — I10 Essential (primary) hypertension: Secondary | ICD-10-CM | POA: Diagnosis not present

## 2016-03-04 HISTORY — PX: DILATATION & CURETTAGE/HYSTEROSCOPY WITH MYOSURE: SHX6511

## 2016-03-04 SURGERY — DILATATION & CURETTAGE/HYSTEROSCOPY WITH MYOSURE
Anesthesia: General

## 2016-03-04 MED ORDER — FENTANYL CITRATE (PF) 250 MCG/5ML IJ SOLN
INTRAMUSCULAR | Status: DC | PRN
  Administered 2016-03-04 (×2): 50 ug via INTRAVENOUS

## 2016-03-04 MED ORDER — LIDOCAINE HCL 1 % IJ SOLN
INTRAMUSCULAR | Status: DC | PRN
  Administered 2016-03-04: 20 mL

## 2016-03-04 MED ORDER — PROPOFOL 10 MG/ML IV BOLUS
INTRAVENOUS | Status: AC
  Filled 2016-03-04: qty 20

## 2016-03-04 MED ORDER — LIDOCAINE HCL 1 % IJ SOLN
INTRAMUSCULAR | Status: AC
  Filled 2016-03-04: qty 20

## 2016-03-04 MED ORDER — GLYCOPYRROLATE 0.2 MG/ML IJ SOLN
INTRAMUSCULAR | Status: DC | PRN
  Administered 2016-03-04: 0.6 mg via INTRAVENOUS

## 2016-03-04 MED ORDER — ONDANSETRON HCL 4 MG/2ML IJ SOLN
INTRAMUSCULAR | Status: AC
  Filled 2016-03-04: qty 2

## 2016-03-04 MED ORDER — ONDANSETRON HCL 4 MG/2ML IJ SOLN
INTRAMUSCULAR | Status: DC | PRN
  Administered 2016-03-04: 4 mg via INTRAVENOUS

## 2016-03-04 MED ORDER — LACTATED RINGERS IV SOLN
INTRAVENOUS | Status: DC
  Administered 2016-03-04 (×2): via INTRAVENOUS

## 2016-03-04 MED ORDER — LIDOCAINE HCL (CARDIAC) 20 MG/ML IV SOLN
INTRAVENOUS | Status: DC | PRN
  Administered 2016-03-04: 60 mg via INTRAVENOUS

## 2016-03-04 MED ORDER — PHENYLEPHRINE 40 MCG/ML (10ML) SYRINGE FOR IV PUSH (FOR BLOOD PRESSURE SUPPORT)
PREFILLED_SYRINGE | INTRAVENOUS | Status: AC
  Filled 2016-03-04: qty 10

## 2016-03-04 MED ORDER — IBUPROFEN 200 MG PO TABS: 400.0000 mg | ORAL_TABLET | Freq: Four times a day (QID) | ORAL | Status: DC | PRN

## 2016-03-04 MED ORDER — DEXAMETHASONE SODIUM PHOSPHATE 10 MG/ML IJ SOLN
INTRAMUSCULAR | Status: AC
  Filled 2016-03-04: qty 1

## 2016-03-04 MED ORDER — ROCURONIUM 10MG/ML (10ML) SYRINGE FOR MEDFUSION PUMP - OPTIME
INTRAVENOUS | Status: DC | PRN
  Administered 2016-03-04: 25 mg via INTRAVENOUS

## 2016-03-04 MED ORDER — NORETHINDRONE ACETATE 5 MG PO TABS: 5.0000 mg | ORAL_TABLET | Freq: Every day | ORAL | Status: DC

## 2016-03-04 MED ORDER — CEFAZOLIN SODIUM-DEXTROSE 2-4 GM/100ML-% IV SOLN
2.0000 g | INTRAVENOUS | Status: AC
  Administered 2016-03-04: 2 g via INTRAVENOUS

## 2016-03-04 MED ORDER — PHENYLEPHRINE HCL 10 MG/ML IJ SOLN
INTRAMUSCULAR | Status: DC | PRN
  Administered 2016-03-04 (×2): 80 ug via INTRAVENOUS
  Administered 2016-03-04: 40 ug via INTRAVENOUS

## 2016-03-04 MED ORDER — ONDANSETRON HCL 4 MG/2ML IJ SOLN: 4.0000 mg | Freq: Four times a day (QID) | INTRAMUSCULAR | Status: DC | PRN

## 2016-03-04 MED ORDER — LIDOCAINE HCL (CARDIAC) 20 MG/ML IV SOLN
INTRAVENOUS | Status: AC
  Filled 2016-03-04: qty 5

## 2016-03-04 MED ORDER — MIDAZOLAM HCL 2 MG/2ML IJ SOLN
INTRAMUSCULAR | Status: AC
  Filled 2016-03-04: qty 2

## 2016-03-04 MED ORDER — NEOSTIGMINE METHYLSULFATE 10 MG/10ML IV SOLN
INTRAVENOUS | Status: DC | PRN
  Administered 2016-03-04: 3 mg via INTRAVENOUS

## 2016-03-04 MED ORDER — FENTANYL CITRATE (PF) 100 MCG/2ML IJ SOLN: 25.0000 ug | INTRAMUSCULAR | Status: DC | PRN

## 2016-03-04 MED ORDER — OXYCODONE HCL 5 MG PO TABS: 5.0000 mg | ORAL_TABLET | Freq: Once | ORAL | Status: DC | PRN

## 2016-03-04 MED ORDER — OXYCODONE HCL 5 MG/5ML PO SOLN: 5.0000 mg | Freq: Once | ORAL | Status: DC | PRN

## 2016-03-04 MED ORDER — EPHEDRINE 5 MG/ML INJ
INTRAVENOUS | Status: AC
  Filled 2016-03-04: qty 10

## 2016-03-04 MED ORDER — PROPOFOL 10 MG/ML IV BOLUS
INTRAVENOUS | Status: DC | PRN
  Administered 2016-03-04: 50 mg via INTRAVENOUS
  Administered 2016-03-04 (×2): 150 mg via INTRAVENOUS

## 2016-03-04 MED ORDER — FENTANYL CITRATE (PF) 250 MCG/5ML IJ SOLN
INTRAMUSCULAR | Status: AC
  Filled 2016-03-04: qty 5

## 2016-03-04 MED ORDER — EPHEDRINE SULFATE 50 MG/ML IJ SOLN
INTRAMUSCULAR | Status: DC | PRN
  Administered 2016-03-04: 5 mg via INTRAVENOUS

## 2016-03-04 MED ORDER — SUCCINYLCHOLINE CHLORIDE 20 MG/ML IJ SOLN
INTRAMUSCULAR | Status: DC | PRN
  Administered 2016-03-04: 100 mg via INTRAVENOUS

## 2016-03-04 SURGICAL SUPPLY — 19 items
CANISTER SUCT 3000ML (MISCELLANEOUS) ×3 IMPLANT
CATH ROBINSON RED A/P 16FR (CATHETERS) ×3 IMPLANT
CLOTH BEACON ORANGE TIMEOUT ST (SAFETY) ×3 IMPLANT
CONTAINER PREFILL 10% NBF 60ML (FORM) ×6 IMPLANT
DEVICE MYOSURE LITE (MISCELLANEOUS) ×2 IMPLANT
DEVICE MYOSURE REACH (MISCELLANEOUS) IMPLANT
DILATOR CANAL MILEX (MISCELLANEOUS) ×2 IMPLANT
FILTER ARTHROSCOPY CONVERTOR (FILTER) ×3 IMPLANT
GLOVE BIO SURGEON STRL SZ7 (GLOVE) ×3 IMPLANT
GLOVE BIOGEL PI IND STRL 7.0 (GLOVE) ×3 IMPLANT
GLOVE BIOGEL PI INDICATOR 7.0 (GLOVE) ×6
GOWN STRL REUS W/TWL LRG LVL3 (GOWN DISPOSABLE) ×6 IMPLANT
PACK VAGINAL MINOR WOMEN LF (CUSTOM PROCEDURE TRAY) ×3 IMPLANT
PAD OB MATERNITY 4.3X12.25 (PERSONAL CARE ITEMS) ×3 IMPLANT
SEAL ROD LENS SCOPE MYOSURE (ABLATOR) ×3 IMPLANT
TOWEL OR 17X24 6PK STRL BLUE (TOWEL DISPOSABLE) ×6 IMPLANT
TUBING AQUILEX INFLOW (TUBING) ×3 IMPLANT
TUBING AQUILEX OUTFLOW (TUBING) ×3 IMPLANT
WATER STERILE IRR 1000ML POUR (IV SOLUTION) ×3 IMPLANT

## 2016-03-04 NOTE — Discharge Instructions (Signed)
Hysteroscopy, Care After Refer to this sheet in the next few weeks. These instructions provide you with information on caring for yourself after your procedure. Your health care provider may also give you more specific instructions. Your treatment has been planned according to current medical practices, but problems sometimes occur. Call your health care provider if you have any problems or questions after your procedure.  WHAT TO EXPECT AFTER THE PROCEDURE After your procedure, it is typical to have the following:  You may have some cramping. This normally lasts for a couple days.  You may have bleeding. This can vary from light spotting for a few days to menstrual-like bleeding for 3-7 days. HOME CARE INSTRUCTIONS  Rest for the first 1-2 days after the procedure.  Only take over-the-counter or prescription medicines as directed by your health care provider.  Take showers instead of baths for 2 weeks or as directed by your health care provider.  Do not drive for 24 hours or as directed.  Do not drink alcohol while taking pain medicine.  Do not use tampons, douche, or have sexual intercourse for 2 weeks or until your health care provider says it is okay.  Take your temperature twice a day for 4-5 days. Write it down each time.  Follow your health care provider's advice about diet, exercise, and lifting.  If you develop constipation, you may:  Take a mild laxative if your health care provider approves.  Add bran foods to your diet.  Drink enough fluids to keep your urine clear or pale yellow.  Try to have someone with you or available to you for the first 24-48 hours, especially if you were given a general anesthetic.  Follow up with your health care provider as directed. SEEK MEDICAL CARE IF:  You feel dizzy or lightheaded.  You feel sick to your stomach (nauseous).  You have abnormal vaginal discharge.  You have a rash.  You have pain that is not controlled with  medicine. SEEK IMMEDIATE MEDICAL CARE IF:  You have bleeding that is heavier than a normal menstrual period.  You have a fever.  You have increasing cramps or pain, not controlled with medicine.  You have new belly (abdominal) pain.  You pass out.  You have pain in the tops of your shoulders (shoulder strap areas).  You have shortness of breath.   This information is not intended to replace advice given to you by your health care provider. Make sure you discuss any questions you have with your health care provider.   Document Released: 07/05/2013 Document Reviewed: 07/05/2013 Elsevier Interactive Patient Education 2016 [facility] Instructions  Activity: Get plenty of rest for the remainder of the day. A responsible adult should stay with you for 24 hours following the procedure.  For the next 24 hours, DO NOT: -Drive a car -Paediatric nurse -Drink alcoholic beverages -Take any medication unless instructed by your physician -Make any legal decisions or sign important papers.  Meals: Start with liquid foods such as gelatin or soup. Progress to regular foods as tolerated. Avoid greasy, spicy, heavy foods. If nausea and/or vomiting occur, drink only clear liquids until the nausea and/or vomiting subsides. Call your physician if vomiting continues.  Special Instructions/Symptoms: Your throat may feel dry or sore from the anesthesia or the breathing tube placed in your throat during surgery. If this causes discomfort, gargle with warm salt water. The discomfort should disappear within 24 hours.  If you had a scopolamine  patch placed behind your ear for the management of post- operative nausea and/or vomiting:  1. The medication in the patch is effective for 72 hours, after which it should be removed.  Wrap patch in a tissue and discard in the trash. Wash hands thoroughly with soap and water. 2. You may remove the patch earlier than 72 hours if you  experience unpleasant side effects which may include dry mouth, dizziness or visual disturbances. 3. Avoid touching the patch. Wash your hands with soap and water after contact with the patch.

## 2016-03-04 NOTE — Anesthesia Preprocedure Evaluation (Signed)
Anesthesia Evaluation  Patient identified by MRN, date of birth, ID band Patient awake    Reviewed: Allergy & Precautions, NPO status , Patient's Chart, lab work & pertinent test results  Airway Mallampati: II   Neck ROM: full    Dental   Pulmonary sleep apnea ,    breath sounds clear to auscultation       Cardiovascular hypertension,  Rhythm:regular Rate:Normal     Neuro/Psych Depression    GI/Hepatic GERD  ,  Endo/Other  Hypothyroidism Morbid obesity  Renal/GU      Musculoskeletal  (+) Arthritis ,   Abdominal   Peds  Hematology   Anesthesia Other Findings   Reproductive/Obstetrics                             Anesthesia Physical Anesthesia Plan  ASA: III  Anesthesia Plan: General   Post-op Pain Management:    Induction: Intravenous  Airway Management Planned: LMA  Additional Equipment:   Intra-op Plan:   Post-operative Plan:   Informed Consent: I have reviewed the patients History and Physical, chart, labs and discussed the procedure including the risks, benefits and alternatives for the proposed anesthesia with the patient or authorized representative who has indicated his/her understanding and acceptance.     Plan Discussed with: CRNA, Anesthesiologist and Surgeon  Anesthesia Plan Comments:         Anesthesia Quick Evaluation

## 2016-03-04 NOTE — Op Note (Signed)
Preoperative diagnosis: Postmenopausal bleeding, thick endometrial stripe. Cervical stenosis  Postop diagnosis: as above.  Procedure: Hysteroscopy and D&C Anesthesia General endotracheal and 20 cc 1% paracervical block Surgeon: Azucena Fallen, MD  Assistant: none  IV fluids : 1300 LR  Estimated blood loss  50 cc Urine output: straight catheter preop  60 cc Complications none  Condition stable  Disposition PACU  Specimen: Endometrial curettings   Procedure  Indication: Postmenopausal bleeding, thick endometrial stripe on pelvic sono. Failed office endometrial biopsy attempt due to cervical stenosis. Patient was recommended hysteroscopy and D&C in OR. Patient was counseled on risks/ complications including infection, bleeding, damage to internal organs, she understood and agrees, gave informed written consent.  Patient was brought to the operating room with IV running. Time out was carried out. She received preop 2 gm Ancef. She underwent general anesthesia via LMA but was converted to endotracheal anesthesia. She was given dorsolithotomy position. Parts were prepped and draped in standard fashion. Bladder was catheterized once. Bimanual exam revealed uterus to be retroverted and normal size, pinpoint cervical os. Large labia were not allowing visualization, so stay sutures taken and each tacked to inner thigh. Speculum was placed and cervix was grasped with single-tooth tenaculum. Cervical block with 20 cc 1% plain Xylocaine given.  Lacrimal dilators used to open the os followed by plastic os finder followed by [place] dilators up to 21. The uterus was sounded to 8 cm.  Hysteroscope was introduced in the uterine cavity under vision, using saline for irrigation.  Findings: noted thick endometrial tissue and tubal ostii were not visualized but no endometrial mass noted as such. Hysteroscope removed. Endometrial curettage was performed with sharp curette. Large amount of tissue removed. All tissue sent  to path.  Hysteroscope reintroduced and cavity surveyed again, no large mass/polyp noted.  All instruments removed. Labial tack sutures cut. No bleeding noted from stitch site.  Fluid deficit 350 cc.   All counts are correct x2. No complications. Patient brought to PACU after extubation.  Follow up in 1 week in office.  Warning signs of infection and excessive bleeding reviewed.   V.Nava Song, MD.

## 2016-03-04 NOTE — H&P (Addendum)
Tessah L Bickerstaff is an 81 y.o. female with postmenopausal bleeding. Thick endometrial stripe on office sono. Cervical stenosis, office endometrial bx failed despite using 2 cytotec 2 hrs before procedure. Menopausal,  used vaginal Estring for years , stopped 3 yrs back.  Nl Paps. Nl mammograms. No pelvic radiation/ no tamoxifen use.   No LMP recorded. Patient is postmenopausal.    Past Medical History  Diagnosis Date  . Hyperlipidemia     under control  . Depression   . OSA on CPAP   . Spondylosis     with scoliosis  . Phlebitis     hx of  . Esophageal reflux   . Morbid obesity with BMI of 40.0-44.9, adult ([place])   . Hypertension   . Osteoarthritis   . Prediabetes     under control with diet    Past Surgical History  Procedure Laterality Date  . Tubal ligation  age 82  . Varicose vein surgery Bilateral age 12  . Wisdom tooth extraction  age 72  . Hernia repair  age 62    umblical  . Cholecystectomy  age 50  . Total knee arthroplasty Left 10/09/2013    Procedure: LEFT TOTAL KNEE ARTHROPLASTY;  Surgeon: [name], MD;  Location: [facility];  Service: Orthopedics;  Laterality: Left;  . Total knee arthroplasty Right 04/09/2014    Procedure: RIGHT TOTAL KNEE ARTHROPLASTY;  Surgeon: [name], MD;  Location: [facility];  Service: Orthopedics;  Laterality: Right;    Family History  Problem Relation Age of Onset  . Heart disease Mother   . Heart disease Father   . CVA Father     Social History:  reports that she has never smoked. She has never used smokeless tobacco. She reports that she does not drink alcohol or use illicit drugs.  Allergies:  Allergies  Allergen Reactions  . Oxybutynin     Bleeding gums, felt sick    Prescriptions prior to admission  Medication Sig Dispense Refill Last Dose  . acetaminophen (TYLENOL) 500 MG tablet Take 1,000 mg by mouth 2 (two) times daily.    03/03/2016 at Unknown time  . allopurinol (ZYLOPRIM) 300 MG tablet Take 300 mg by mouth  daily with breakfast.    03/03/2016 at Unknown time  . cetirizine (ZYRTEC) 10 MG tablet Take 10 mg by mouth daily.   03/03/2016 at Unknown time  . citalopram (CELEXA) 10 MG tablet Take 10 mg by mouth daily.   03/04/2016 at 0545  . furosemide (LASIX) 20 MG tablet Take 20 mg by mouth daily.   Past Week at Unknown time  . hydrOXYzine (ATARAX/VISTARIL) 25 MG tablet Take 25 mg by mouth at bedtime.   03/03/2016 at Unknown time  . levothyroxine (SYNTHROID, LEVOTHROID) 125 MCG tablet Take 125 mcg by mouth daily before breakfast.   03/04/2016 at 0545  . lisinopril (PRINIVIL,ZESTRIL) 20 MG tablet Take 20 mg by mouth daily with breakfast.    03/04/2016 at 0545  . methocarbamol (ROBAXIN) 500 MG tablet Take 1 tablet (500 mg total) by mouth every 6 (six) hours as needed for muscle spasms. 80 tablet 0 Past Month at Unknown time  . omeprazole (PRILOSEC OTC) 20 MG tablet Take 20 mg by mouth daily.   03/04/2016 at 0545  . OVER THE COUNTER MEDICATION Take 1 tablet by mouth daily. Iron supplement   03/03/2016 at Unknown time  . polyethylene glycol (MIRALAX / GLYCOLAX) packet Take 17 g by mouth daily as needed for mild constipation  or moderate constipation. 14 each 0 Past Month at Unknown time  . tolterodine (DETROL LA) 4 MG 24 hr capsule Take 4 mg by mouth daily.   03/03/2016 at Unknown time  . traMADol (ULTRAM) 50 MG tablet Take 1-2 tablets (50-100 mg total) by mouth every 6 (six) hours as needed (mild pain). 80 tablet 1 03/03/2016 at Unknown time  . omeprazole (PRILOSEC) 20 MG capsule Take 20 mg by mouth daily.   Taking  . ranitidine (ZANTAC) 300 MG tablet Take 300 mg by mouth at bedtime.   Unknown at Unknown time  . simvastatin (ZOCOR) 20 MG tablet Take 20 mg by mouth daily.   Unknown at Unknown time  . simvastatin (ZOCOR) 40 MG tablet Take 20 mg by mouth at bedtime.   Taking    ROS neg   Blood pressure 115/56, pulse 83, temperature 99.2 F (37.3 C), temperature source Oral, resp. rate 22, SpO2 95 %. Physical Exam  A&O x 3, no  acute distress. Pleasant HEENT neg, no thyromegaly Lungs CTA bilat CV RRR,SS2 normal Abdo soft, non tender, non acute Extr no edema/ tenderness Pelvic- nl uterus, cervical stenosis.   Results for orders placed or performed during the hospital encounter of 03/03/16 (from the past 24 hour(s))  Surgical pcr screen     Status: None   Collection Time: 03/03/16  4:15 PM  Result Value Ref Range   MRSA, PCR NEGATIVE NEGATIVE   Staphylococcus aureus NEGATIVE NEGATIVE  CBC     Status: Abnormal   Collection Time: 03/03/16  4:35 PM  Result Value Ref Range   WBC 12.7 (H) 4.0 - 10.5 K/uL   RBC 4.12 3.87 - 5.11 MIL/uL   Hemoglobin 12.7 12.0 - 15.0 g/dL   HCT 38.7 36.0 - 46.0 %   MCV 93.9 78.0 - 100.0 fL   MCH 30.8 26.0 - 34.0 pg   MCHC 32.8 30.0 - 36.0 g/dL   RDW 14.4 11.5 - 15.5 %   Platelets 296 150 - 400 K/uL  Comprehensive metabolic panel     Status: Abnormal   Collection Time: 03/03/16  4:35 PM  Result Value Ref Range   Sodium 136 135 - 145 mmol/L   Potassium 4.9 3.5 - 5.1 mmol/L   Chloride 102 101 - 111 mmol/L   CO2 27 22 - 32 mmol/L   Glucose, Bld 118 (H) 65 - 99 mg/dL   BUN 29 (H) 6 - 20 mg/dL   Creatinine, Ser 1.04 (H) 0.44 - 1.00 mg/dL   Calcium 9.8 8.9 - 10.3 mg/dL   Total Protein 6.6 6.5 - 8.1 g/dL   Albumin 3.9 3.5 - 5.0 g/dL   AST 24 15 - 41 U/L   ALT 16 14 - 54 U/L   Alkaline Phosphatase 95 38 - 126 U/L   Total Bilirubin 0.5 0.3 - 1.2 mg/dL   GFR calc non Af Amer 50 (L) >60 mL/min   GFR calc Af Amer 58 (L) >60 mL/min   Anion gap 7 5 - 15    No results found.  Assessment/Plan: 81 yo, menopausal female with bleeding. Here for D&C, hysteroscopy, possible polypectomy. Cervical stenosis, failed office biopsy attempt. . Pt used Cytotec last night and this morning vaginally.   Reviewed possible endometrial hyperplasia and cancer concerns. Pt understands and agrees.  Risks/complications of surgery reviewed incl infection, bleeding, damage to internal organs including  bladder, bowels, ureters, blood vessels, other risks from anesthesia, VTE and delayed complications of any surgery, complications in future surgery  reviewed.    Persephanie Laatsch R 03/04/2016, 8:27 AM

## 2016-03-04 NOTE — Anesthesia Postprocedure Evaluation (Signed)
Anesthesia Post Note  Patient: Melinda Kerr  Procedure(s) Performed: Procedure(s) (LRB): DILATATION & CURETTAGE/HYSTEROSCOPY  (N/A)  Patient location during evaluation: PACU Anesthesia Type: General Level of consciousness: awake Pain management: pain level controlled Respiratory status: spontaneous breathing Cardiovascular status: stable Postop Assessment: no signs of nausea or vomiting Anesthetic complications: no     Last Vitals:  Filed Vitals:   03/04/16 1130 03/04/16 1145  BP: 94/57 96/61  Pulse: 82 82  Temp:  37.1 C  Resp: 15 20    Last Pain: There were no vitals filed for this visit. Pain Goal: Patients Stated Pain Goal: 3 (03/04/16 1014)               [place]

## 2016-03-04 NOTE — Anesthesia Procedure Notes (Addendum)
Procedure Name: LMA Insertion Date/Time: 03/04/2016 8:55 AM Performed by: Flossie Dibble Pre-anesthesia Checklist: Patient identified, Timeout performed, Emergency Drugs available, Suction available and Patient being monitored Patient Re-evaluated:Patient Re-evaluated prior to inductionOxygen Delivery Method: Circle system utilized Preoxygenation: Pre-oxygenation with 100% oxygen Intubation Type: IV induction LMA: LMA inserted and LMA with gastric port inserted LMA Size: 4.0 Number of attempts: 2 Placement Confirmation: positive ETCO2 and breath sounds checked- equal and bilateral Tube secured with: Tape Dental Injury: Teeth and Oropharynx as per pre-operative assessment  Difficulty Due To: Difficulty was unanticipated Comments: LMA # 4 Unique and Supreme  inserted, + ETC02, and bilateral breath sounds, but unable to adequately maintain tidal volume or assist patient without leak. Proceeded to Intubation.   Procedure Name: Intubation Date/Time: 03/04/2016 9:51 AM Performed by: Flossie Dibble Pre-anesthesia Checklist: Patient identified, Timeout performed, Emergency Drugs available, Suction available and Patient being monitored Patient Re-evaluated:Patient Re-evaluated prior to inductionOxygen Delivery Method: Circle system utilized Intubation Type: IV induction Laryngoscope Size: Mac and 3 Grade View: Grade I Tube type: Oral Tube size: 7.0 mm Number of attempts: 1 Airway Equipment and Method: Stylet Secured at: 21 cm Tube secured with: Tape Dental Injury: Teeth and Oropharynx as per pre-operative assessment

## 2016-03-04 NOTE — Transfer of Care (Signed)
Immediate Anesthesia Transfer of Care Note  Patient: Melinda Kerr  Procedure(s) Performed: Procedure(s) with comments: DILATATION & CURETTAGE/HYSTEROSCOPY  (N/A) - polyp removal   Patient Location: PACU  Anesthesia Type:General  Level of Consciousness: awake, alert  and oriented  Airway & Oxygen Therapy: Patient Spontanous Breathing and Patient connected to nasal cannula oxygen  Post-op Assessment: Report given to RN and Post -op Vital signs reviewed and stable  Post vital signs: Reviewed and stable  Last Vitals:  Filed Vitals:   03/04/16 0747  BP: 115/56  Pulse: 83  Temp: 37.3 C  Resp: 22    Last Pain: There were no vitals filed for this visit.    Patients Stated Pain Goal: 3 ([id number])  Complications: No apparent anesthesia complications

## 2016-03-05 ENCOUNTER — Encounter (HOSPITAL_COMMUNITY): Payer: Self-pay | Admitting: Obstetrics & Gynecology

## 2016-03-19 ENCOUNTER — Ambulatory Visit (HOSPITAL_BASED_OUTPATIENT_CLINIC_OR_DEPARTMENT_OTHER): Payer: Medicare Other | Admitting: Gynecologic Oncology

## 2016-03-19 ENCOUNTER — Ambulatory Visit (HOSPITAL_COMMUNITY)
Admission: RE | Admit: 2016-03-19 | Discharge: 2016-03-19 | Disposition: A | Discharge status: Home / Self Care | Payer: Medicare Other | Source: Ambulatory Visit | Attending: Gynecologic Oncology | Admitting: Gynecologic Oncology

## 2016-03-19 ENCOUNTER — Other Ambulatory Visit (HOSPITAL_BASED_OUTPATIENT_CLINIC_OR_DEPARTMENT_OTHER): Payer: Medicare Other

## 2016-03-19 ENCOUNTER — Encounter: Payer: Self-pay | Admitting: Gynecologic Oncology

## 2016-03-19 VITALS — BP 138/67 | HR 81 | Temp 98.9°F | Resp 19 | Wt 242.9 lb

## 2016-03-19 DIAGNOSIS — R918 Other nonspecific abnormal finding of lung field: Secondary | ICD-10-CM | POA: Insufficient documentation

## 2016-03-19 DIAGNOSIS — R0609 Other forms of dyspnea: Secondary | ICD-10-CM

## 2016-03-19 DIAGNOSIS — K449 Diaphragmatic hernia without obstruction or gangrene: Secondary | ICD-10-CM | POA: Diagnosis not present

## 2016-03-19 DIAGNOSIS — J9811 Atelectasis: Secondary | ICD-10-CM | POA: Diagnosis not present

## 2016-03-19 DIAGNOSIS — C55 Malignant neoplasm of uterus, part unspecified: Secondary | ICD-10-CM

## 2016-03-19 DIAGNOSIS — Z6841 Body Mass Index (BMI) 40.0 and over, adult: Secondary | ICD-10-CM

## 2016-03-19 DIAGNOSIS — Z86718 Personal history of other venous thrombosis and embolism: Secondary | ICD-10-CM

## 2016-03-19 DIAGNOSIS — I517 Cardiomegaly: Secondary | ICD-10-CM | POA: Diagnosis not present

## 2016-03-19 DIAGNOSIS — G4700 Insomnia, unspecified: Secondary | ICD-10-CM

## 2016-03-19 DIAGNOSIS — Z01818 Encounter for other preprocedural examination: Secondary | ICD-10-CM | POA: Diagnosis not present

## 2016-03-19 DIAGNOSIS — Z7982 Long term (current) use of aspirin: Secondary | ICD-10-CM

## 2016-03-19 LAB — CBC WITH DIFFERENTIAL/PLATELET
BASO%: 0.6 % (ref 0.0–2.0)
Basophils Absolute: 0 10*3/uL (ref 0.0–0.1)
EOS%: 2.7 % (ref 0.0–7.0)
Eosinophils Absolute: 0.2 10*3/uL (ref 0.0–0.5)
HCT: 38.8 % (ref 34.8–46.6)
HGB: 12.7 g/dL (ref 11.6–15.9)
LYMPH%: 21.5 % (ref 14.0–49.7)
MCH: 30.5 pg (ref 25.1–34.0)
MCHC: 32.7 g/dL (ref 31.5–36.0)
MCV: 93.3 fL (ref 79.5–101.0)
MONO#: 0.9 10*3/uL (ref 0.1–0.9)
MONO%: 12.2 % (ref 0.0–14.0)
NEUT%: 63 % (ref 38.4–76.8)
NEUTROS ABS: 4.5 10*3/uL (ref 1.5–6.5)
Platelets: 376 10*3/uL (ref 145–400)
RBC: 4.16 10*6/uL (ref 3.70–5.45)
RDW: 14 % (ref 11.2–14.5)
WBC: 7.1 10*3/uL (ref 3.9–10.3)
lymph#: 1.5 10*3/uL (ref 0.9–3.3)
nRBC: 0 % (ref 0–0)

## 2016-03-19 LAB — BASIC METABOLIC PANEL
ANION GAP: 8 meq/L (ref 3–11)
BUN: 16.6 mg/dL (ref 7.0–26.0)
CO2: 26 mEq/L (ref 22–29)
CREATININE: 1 mg/dL (ref 0.6–1.1)
Calcium: 10 mg/dL (ref 8.4–10.4)
Chloride: 105 mEq/L (ref 98–109)
EGFR: 54 mL/min/{1.73_m2} — ABNORMAL LOW (ref >90)
Glucose: 93 mg/dl (ref 70–140)
POTASSIUM: 4.7 meq/L (ref 3.5–5.1)
Sodium: 139 mEq/L (ref 136–145)

## 2016-03-19 NOTE — Progress Notes (Signed)
Consult Note: Gyn-Onc  Consult was requested by Dr. Benjie Karvonen for the evaluation of Melinda Kerr 81 y.o. female  CC:  Chief Complaint  Patient presents with  . Uterine Cancer    New Consultation    Assessment/Plan:  Melinda Kerr  is a 81 y.o.  year old morbidly obese woman (BMI 44) with high grade serous endometrial cancer.   She has SOB on exertion and peripheral edema.    A detailed discussion was held with the patient and her family with regard to to her endometrial cancer diagnosis. We discussed the standard management options for uterine cancer which includes surgery followed possibly by adjuvant therapy depending on the results of surgery. The options for surgical management include a hysterectomy and removal of the tubes and ovaries possibly with removal of pelvic and para-aortic lymph nodes. A minimally invasive approach including a robotic hysterectomy or laparoscopic hysterectomy have benefits including shorter hospital stay, recovery time and better wound healing. The patient has been counseled about these surgical options and the risks of surgery in general including infection, bleeding, damage to surrounding structures (including bowel, bladder, ureters, nerves or vessels), and the postoperative risks of PE/ DVT, and lymphedema. I explained these risks are increased in morbidly obese patients such as Melinda Kerr. I extensively reviewed the additional risks of robotic hysterectomy including possible need for conversion to open laparotomy.  I discussed positioning during surgery of trendelenberg and risks of minor facial swelling and care we take in preoperative positioning.  After counseling and consideration of her options, she desires to proceed with robotic assisted total hysterectomy, BSO, SLN biopsy.   She will be seen by anesthesia for preoperative clearance and discussion of postoperative pain management.  She was given the opportunity to ask questions, which were answered  to her satisfaction, and she is agreement with the above mentioned plan of care.  Due to her high grade cancer I have ordered CT imaging of the chest, abdo/pelvis to evaluate for metastatic disease.  Due to her history of prior DVT, I recommend 2 weeks of lovenox postop for prophylaxis.  I recommended she stop her ASA now, and explained recent ASA use increases perioperative bleeding risk.  I am concerned about her dyspnea on exertion and therefore recommend checking BNP and CXR to rule out occult CHF (she has intermitent LE edema).   If no signs of distant mets and the patient is doing well from a cardiac standpoint, we will plan for surgery on 03/26/16. I explained that due to the high grade nature of her tumor she will almost certainly require postop adjuvant chemotherapy and radiation regardless of stage due to the high risk for recurrence associated with serous endometrial cancer.  HPI: Melinda Kerr is a 81 year old G0 who is seen in consultation of Dr Benjie Karvonen for serous endometrial cancer in the setting of morbid obesity (BMI 44). The patient reports a history of postmenopausal bleeding on 01/28/16. She then saw Dr Benjie Karvonen who performed a TVUS on 02/13/16 which revealed 6.2 x 5.3 x 4cm and a thickened endometrial stripe. There was an echogenic mass measuring 22.8cm felt to represent a dermoid cyst on the left ovary. The right ovary was normal. A D&C was performed on 03/04/16 and showed high grade serous carcinoma.   She has morbid obesity with a BMI of 44kg/m2. She has HTN, and a remote history of a spontaneous DVT treated with 6 months of coumadin. She has chronic back pain and scoliosis. The patient's  surgical history is most notable for a laparoscopic cholecystectomy and an umbilical hernia repair without mesh in 2007.  Current Meds:  Outpatient Encounter Prescriptions as of 03/19/2016  Medication Sig  . acetaminophen (TYLENOL) 500 MG tablet Take 1,000 mg by mouth 2 (two) times daily.   Marland Kitchen  allopurinol (ZYLOPRIM) 300 MG tablet Take 300 mg by mouth daily with breakfast.   . cetirizine (ZYRTEC) 10 MG tablet Take 10 mg by mouth daily.  . citalopram (CELEXA) 10 MG tablet Take 10 mg by mouth daily.  . furosemide (LASIX) 20 MG tablet Take 20 mg by mouth daily.  . hydrOXYzine (ATARAX/VISTARIL) 25 MG tablet Take 25 mg by mouth at bedtime.  Marland Kitchen levothyroxine (SYNTHROID, LEVOTHROID) 125 MCG tablet Take 125 mcg by mouth daily before breakfast.  . lisinopril (PRINIVIL,ZESTRIL) 20 MG tablet Take 20 mg by mouth daily with breakfast.   . omeprazole (PRILOSEC OTC) 20 MG tablet Take 20 mg by mouth daily.  Marland Kitchen OVER THE COUNTER MEDICATION Take 1 tablet by mouth daily. Iron supplement  . polyethylene glycol (MIRALAX / GLYCOLAX) packet Take 17 g by mouth daily as needed for mild constipation or moderate constipation.  Marland Kitchen tolterodine (DETROL LA) 4 MG 24 hr capsule Take 4 mg by mouth daily.  . traMADol (ULTRAM) 50 MG tablet Take 1-2 tablets (50-100 mg total) by mouth every 6 (six) hours as needed (mild pain).  . [DISCONTINUED] ranitidine (ZANTAC) 300 MG tablet Take 300 mg by mouth at bedtime.  . methocarbamol (ROBAXIN) 500 MG tablet Take 1 tablet (500 mg total) by mouth every 6 (six) hours as needed for muscle spasms. (Patient not taking: Reported on 03/19/2016)  . norethindrone (AYGESTIN) 5 MG tablet Take 1 tablet (5 mg total) by mouth daily.  . [DISCONTINUED] ibuprofen (ADVIL) 200 MG tablet Take 2 tablets (400 mg total) by mouth every 6 (six) hours as needed.  . [DISCONTINUED] omeprazole (PRILOSEC) 20 MG capsule Take 20 mg by mouth daily.  . [DISCONTINUED] simvastatin (ZOCOR) 20 MG tablet Take 20 mg by mouth daily.  . [DISCONTINUED] simvastatin (ZOCOR) 40 MG tablet Take 20 mg by mouth at bedtime.   No facility-administered encounter medications on file as of 03/19/2016.    Allergy:  Allergies  Allergen Reactions  . Oxybutynin     Bleeding gums, felt sick    Social Hx:   Social History   Social  History  . Marital Status: Widowed    Spouse Name: N/A  . Number of Children: N/A  . Years of Education: N/A   Occupational History  . Not on file.   Social History Main Topics  . Smoking status: Never Smoker   . Smokeless tobacco: Never Used  . Alcohol Use: No  . Drug Use: No  . Sexual Activity: Not Currently   Other Topics Concern  . Not on file   Social History Narrative    Past Surgical Hx:  Past Surgical History  Procedure Laterality Date  . Tubal ligation  age 11  . Varicose vein surgery Bilateral age 84  . Wisdom tooth extraction  age 58  . Hernia repair  age 9    umblical  . Cholecystectomy  age 66  . Total knee arthroplasty Left 10/09/2013    Procedure: LEFT TOTAL KNEE ARTHROPLASTY;  Surgeon: [name], MD;  Location: [facility];  Service: Orthopedics;  Laterality: Left;  . Total knee arthroplasty Right 04/09/2014    Procedure: RIGHT TOTAL KNEE ARTHROPLASTY;  Surgeon: [name], MD;  Location:  [facility];  Service: Orthopedics;  Laterality: Right;  . Dilatation & curettage/hysteroscopy with myosure N/A 03/04/2016    Procedure: DILATATION & CURETTAGE/HYSTEROSCOPY ;  Surgeon: Azucena Fallen, MD;  Location: [facility];  Service: Gynecology;  Laterality: N/A;  polyp removal     Past Medical Hx:  Past Medical History  Diagnosis Date  . Hyperlipidemia     under control  . Depression   . OSA on CPAP   . Spondylosis     with scoliosis  . Phlebitis     hx of  . Esophageal reflux   . Morbid obesity with BMI of 40.0-44.9, adult ([place])   . Hypertension   . Osteoarthritis   . Prediabetes     under control with diet    Past Gynecological History:  G0  No LMP recorded. Patient is postmenopausal.  Family Hx:  Family History  Problem Relation Age of Onset  . Heart disease Mother   . Heart disease Father   . CVA Father     Review of Systems:  Constitutional  Feels well,    ENT Normal appearing ears and nares bilaterally Skin/Breast  No rash, sores,  jaundice, itching, dryness Cardiovascular  No chest pain, shortness of breath, or edema  Pulmonary  No cough or wheeze.  Gastro Intestinal  No nausea, vomitting, or diarrhoea. No bright red blood per rectum, no abdominal pain, change in bowel movement, or constipation.  Genito Urinary  No frequency, urgency, dysuria, + postmenopausal bleeding Musculo Skeletal  No myalgia, arthralgia, joint swelling or pain  Neurologic  No weakness, numbness, change in gait,  Psychology  No depression, anxiety, insomnia.   Vitals:  Blood pressure 138/67, pulse 81, temperature 98.9 F (37.2 C), temperature source Oral, resp. rate 19, weight 242 lb 14.4 oz (110.179 kg), SpO2 98 %.  Physical Exam: WD in NAD Neck  Supple NROM, without any enlargements.  Lymph Node Survey No cervical supraclavicular or inguinal adenopathy Cardiovascular  Pulse normal rate, regularity and rhythm. S1 and S2 normal.  Lungs  Clear to auscultation bilateraly. + audible wheezing on exertion. Increased work of breathing when positioning on bed. Good air movement.  Skin  No rash/lesions/breakdown  Psychiatry  Alert and oriented to person, place, and time  Abdomen  Normoactive bowel sounds, abdomen soft, non-tender and obese with pannus folds without evidence of hernia.  Back No CVA tenderness Genito Urinary  Vulva/vagina: Normal external female genitalia.  No lesions. No discharge or bleeding.  Bladder/urethra:  No lesions or masses, well supported bladder  Vagina: long and normal mucosa  Cervix: difficult to visualize. Tilted posteriorally. Palpably normal.  Uterus:  Small, mobile, no parametrial involvement or nodularity. Difficult to fully appreciate due to body habitus.  Adnexa: no palpable masses. Rectal  Good tone, no masses no cul de sac nodularity.  Extremities  No bilateral cyanosis, clubbing or edema.   Donaciano Eva, MD  03/19/2016, 4:42 PM

## 2016-03-19 NOTE — Patient Instructions (Signed)
Preparing for your Surgery  Plan for surgery on June 29 ,2017 with Dr. Everitt Amber Robotic Hysterectomy , bilateral salpingo-oophorectomy ,sentinel lymph node biopy.  Pre-operative Testing -You will receive a phone call from presurgical testing at [facility] to arrange for a pre-operative testing appointment before your surgery.  This appointment normally occurs one to two weeks before your scheduled surgery.   -Bring your insurance card, copy of an advanced directive if applicable, medication list  -At that visit, you will be asked to sign a consent for a possible blood transfusion in case a transfusion becomes necessary during surgery.  The need for a blood transfusion is rare but having consent is a necessary part of your care.     -You should not be taking blood thinners or aspirin at least ten days prior to surgery unless instructed by your surgeon.  Day Before Surgery at [place] will be asked to take in a light diet the day before surgery.  Avoid carbonated beverages.  You will be advised to have nothing to eat or drink after midnight the evening before.     Eat a light diet the day before surgery.  Examples including soups, broths,  toast, yogurt, mashed potatoes.  Things to avoid include carbonated beverages  (fizzy beverages), raw fruits and raw vegetables, or beans.    If your bowels are filled with gas, your surgeon will have difficulty  visualizing your pelvic organs which increases your surgical risks.  Your role in recovery Your role is to become active as soon as directed by your doctor, while still giving yourself time to heal.  Rest when you feel tired. You will be asked to do the following in order to speed your recovery:  - Cough and breathe deeply. This helps toclear and expand your lungs and can prevent pneumonia. You may be given a spirometer to practice deep breathing. A staff member will show you how to use the spirometer. - Do mild physical  activity. Walking or moving your legs help your circulation and body functions return to normal. A staff member will help you when you try to walk and will provide you with simple exercises. Do not try to get up or walk alone the first time. - Actively manage your pain. Managing your pain lets you move in comfort. We will ask you to rate your pain on a scale of zero to 10. It is your responsibility to tell your doctor or nurse where and how much you hurt so your pain can be treated.  Special Considerations -If you are diabetic, you may be placed on insulin after surgery to have closer control over your blood sugars to promote healing and recovery.  This does not mean that you will be discharged on insulin.  If applicable, your oral antidiabetics will be resumed when you are tolerating a solid diet.  -Your final pathology results from surgery should be available by the Friday after surgery and the results will be relayed to you when available.    Blood Transfusion Information WHAT IS A BLOOD TRANSFUSION? A transfusion is the replacement of blood or some of its parts. Blood is made up of multiple cells which provide different functions.  Red blood cells carry oxygen and are used for blood loss replacement.  White blood cells fight against infection.  Platelets control bleeding.  Plasma helps clot blood.  Other blood products are available for specialized needs, such as hemophilia or other clotting disorders. BEFORE THE TRANSFUSION  Who gives blood for transfusions?   You may be able to donate blood to be used at a later date on yourself (autologous donation).  Relatives can be asked to donate blood. This is generally not any safer than if you have received blood from a stranger. The same precautions are taken to ensure safety when a relative's blood is donated.  Healthy volunteers who are fully evaluated to make sure their blood is safe. This is blood bank blood. Transfusion therapy is  the safest it has ever been in the practice of medicine. Before blood is taken from a donor, a complete history is taken to make sure that person has no history of diseases nor engages in risky social behavior (examples are intravenous drug use or sexual activity with multiple partners). The donor's travel history is screened to minimize risk of transmitting infections, such as malaria. The donated blood is tested for signs of infectious diseases, such as HIV and hepatitis. The blood is then tested to be sure it is compatible with you in order to minimize the chance of a transfusion reaction. If you or a relative donates blood, this is often done in anticipation of surgery and is not appropriate for emergency situations. It takes many days to process the donated blood. RISKS AND COMPLICATIONS Although transfusion therapy is very safe and saves many lives, the main dangers of transfusion include:   Getting an infectious disease.  Developing a transfusion reaction. This is an allergic reaction to something in the blood you were given. Every precaution is taken to prevent this. The decision to have a blood transfusion has been considered carefully by your caregiver before blood is given. Blood is not given unless the benefits outweigh the risks.

## 2016-03-20 LAB — BRAIN NATRIURETIC PEPTIDE: BNP: 121.1 pg/mL — ABNORMAL HIGH (ref 0.0–100.0)

## 2016-03-24 ENCOUNTER — Encounter (HOSPITAL_COMMUNITY): Payer: Self-pay

## 2016-03-24 ENCOUNTER — Ambulatory Visit (HOSPITAL_COMMUNITY)
Admission: RE | Admit: 2016-03-24 | Discharge: 2016-03-24 | Disposition: A | Discharge status: Home / Self Care | Payer: Medicare Other | Source: Ambulatory Visit | Attending: Gynecologic Oncology | Admitting: Gynecologic Oncology

## 2016-03-24 DIAGNOSIS — I700 Atherosclerosis of aorta: Secondary | ICD-10-CM | POA: Insufficient documentation

## 2016-03-24 DIAGNOSIS — C541 Malignant neoplasm of endometrium: Secondary | ICD-10-CM | POA: Diagnosis not present

## 2016-03-24 DIAGNOSIS — I2510 Atherosclerotic heart disease of native coronary artery without angina pectoris: Secondary | ICD-10-CM | POA: Insufficient documentation

## 2016-03-24 DIAGNOSIS — C55 Malignant neoplasm of uterus, part unspecified: Secondary | ICD-10-CM | POA: Diagnosis not present

## 2016-03-24 DIAGNOSIS — R1909 Other intra-abdominal and pelvic swelling, mass and lump: Secondary | ICD-10-CM | POA: Diagnosis not present

## 2016-03-24 DIAGNOSIS — K4090 Unilateral inguinal hernia, without obstruction or gangrene, not specified as recurrent: Secondary | ICD-10-CM | POA: Diagnosis not present

## 2016-03-24 DIAGNOSIS — R590 Localized enlarged lymph nodes: Secondary | ICD-10-CM | POA: Insufficient documentation

## 2016-03-24 DIAGNOSIS — R938 Abnormal findings on diagnostic imaging of other specified body structures: Secondary | ICD-10-CM | POA: Insufficient documentation

## 2016-03-24 DIAGNOSIS — K449 Diaphragmatic hernia without obstruction or gangrene: Secondary | ICD-10-CM | POA: Diagnosis not present

## 2016-03-24 DIAGNOSIS — R0609 Other forms of dyspnea: Secondary | ICD-10-CM | POA: Diagnosis not present

## 2016-03-24 MED ORDER — IOPAMIDOL (ISOVUE-300) INJECTION 61%
100.0000 mL | Freq: Once | INTRAVENOUS | Status: AC | PRN
  Administered 2016-03-24: 100 mL via INTRAVENOUS

## 2016-03-24 NOTE — Patient Instructions (Signed)
Melinda Kerr  03/24/2016   Your procedure is scheduled on: 03/26/2016    Report to [facility] Main  Entrance take [place]  elevators to 3rd floor to  [place] at    0800 AM.  Call this number if you have problems the morning of surgery [phone]   Remember: ONLY 1 PERSON MAY GO WITH YOU TO SHORT STAY TO GET  READY MORNING OF [place].             Eat a light diet the day before surgery.  Examples include: soups, broth, toast ,yogurt and mashed potatoes.  Things to avoid include; carbonated beverages ( fizzy beverages), raw fruits and vegetables and beans.       Take these medicines the morning of surgery with A SIP OF WATER:   Allopurinol ( Zyrtec), Celexa, synthroid, Omeprazole ( Prilosec), Detrol LA                                 You may not have any metal on your body including hair pins and              piercings  Do not wear jewelry, make-up, lotions, powders or perfumes, deodorant             Do not wear nail polish.  Do not shave  48 hours prior to surgery.               Do not bring valuables to the hospital. [place].  Contacts, dentures or bridgework may not be worn into surgery.  Leave suitcase in the car. After surgery it may be brought to your room.         Special Instructions: coughing and deep breathing exercises, leg exercises               Please read over the following fact sheets you were given: _____________________________________________________________________             [facility] - Preparing for Surgery Before surgery, you can play an important role.  Because skin is not sterile, your skin needs to be as free of germs as possible.  You can reduce the number of germs on your skin by washing with CHG (chlorahexidine gluconate) soap before surgery.  CHG is an antiseptic cleaner which kills germs and bonds with the skin to continue killing germs even after  washing. Please DO NOT use if you have an allergy to CHG or antibacterial soaps.  If your skin becomes reddened/irritated stop using the CHG and inform your nurse when you arrive at Short Stay. Do not shave (including legs and underarms) for at least 48 hours prior to the first CHG shower.  You may shave your face/neck. Please follow these instructions carefully:  1.  Shower with CHG Soap the night before surgery and the  morning of Surgery.  2.  If you choose to wash your hair, wash your hair first as usual with your  normal  shampoo.  3.  After you shampoo, rinse your hair and body thoroughly to remove the  shampoo.  4.  Use CHG as you would any other liquid soap.  You can apply chg directly  to the skin and wash                       Gently with a scrungie or clean washcloth.  5.  Apply the CHG Soap to your body ONLY FROM THE NECK DOWN.   Do not use on face/ open                           Wound or open sores. Avoid contact with eyes, ears mouth and genitals (private parts).                       Wash face,  Genitals (private parts) with your normal soap.             6.  Wash thoroughly, paying special attention to the area where your surgery  will be performed.  7.  Thoroughly rinse your body with warm water from the neck down.  8.  DO NOT shower/wash with your normal soap after using and rinsing off  the CHG Soap.                9.  Pat yourself dry with a clean towel.            10.  Wear clean pajamas.            11.  Place clean sheets on your bed the night of your first shower and do not  sleep with pets. Day of Surgery : Do not apply any lotions/deodorants the morning of surgery.  Please wear clean clothes to the hospital/surgery center.  FAILURE TO FOLLOW THESE INSTRUCTIONS MAY RESULT IN THE CANCELLATION OF YOUR SURGERY PATIENT SIGNATURE_________________________________  NURSE  SIGNATURE__________________________________  ________________________________________________________________________  WHAT IS A BLOOD TRANSFUSION? Blood Transfusion Information  A transfusion is the replacement of blood or some of its parts. Blood is made up of multiple cells which provide different functions.  Red blood cells carry oxygen and are used for blood loss replacement.  White blood cells fight against infection.  Platelets control bleeding.  Plasma helps clot blood.  Other blood products are available for specialized needs, such as hemophilia or other clotting disorders. BEFORE THE TRANSFUSION  Who gives blood for transfusions?   Healthy volunteers who are fully evaluated to make sure their blood is safe. This is blood bank blood. Transfusion therapy is the safest it has ever been in the practice of medicine. Before blood is taken from a donor, a complete history is taken to make sure that person has no history of diseases nor engages in risky social behavior (examples are intravenous drug use or sexual activity with multiple partners). The donor's travel history is screened to minimize risk of transmitting infections, such as malaria. The donated blood is tested for signs of infectious diseases, such as HIV and hepatitis. The blood is then tested to be sure it is compatible with you in order to minimize the chance of a transfusion reaction. If you or a relative donates blood, this is often done in anticipation of surgery and is not appropriate for emergency situations. It takes many days to process the donated blood. RISKS AND COMPLICATIONS Although transfusion therapy is very safe and saves many lives, the main dangers of transfusion include:  1. Getting an infectious disease. 2. Developing a transfusion reaction. This  is an allergic reaction to something in the blood you were given. Every precaution is taken to prevent this. The decision to have a blood transfusion has been  considered carefully by your caregiver before blood is given. Blood is not given unless the benefits outweigh the risks. AFTER THE TRANSFUSION  Right after receiving a blood transfusion, you will usually feel much better and more energetic. This is especially true if your red blood cells have gotten low (anemic). The transfusion raises the level of the red blood cells which carry oxygen, and this usually causes an energy increase.  The nurse administering the transfusion will monitor you carefully for complications. HOME CARE INSTRUCTIONS  No special instructions are needed after a transfusion. You may find your energy is better. Speak with your caregiver about any limitations on activity for underlying diseases you may have. SEEK MEDICAL CARE IF:   Your condition is not improving after your transfusion.  You develop redness or irritation at the intravenous (IV) site. SEEK IMMEDIATE MEDICAL CARE IF:  Any of the following symptoms occur over the next 12 hours:  Shaking chills.  You have a temperature by mouth above 102 F (38.9 C), not controlled by medicine.  Chest, back, or muscle pain.  People around you feel you are not acting correctly or are confused.  Shortness of breath or difficulty breathing.  Dizziness and fainting.  You get a rash or develop hives.  You have a decrease in urine output.  Your urine turns a dark color or changes to pink, red, or brown. Any of the following symptoms occur over the next 10 days:  You have a temperature by mouth above 102 F (38.9 C), not controlled by medicine.  Shortness of breath.  Weakness after normal activity.  The white part of the eye turns yellow (jaundice).  You have a decrease in the amount of urine or are urinating less often.  Your urine turns a dark color or changes to pink, red, or brown. Document Released: 09/11/2000 Document Revised: 12/07/2011 Document Reviewed: 04/30/2008 ExitCare Patient Information 2014  [place].  _______________________________________________________________________  Incentive Spirometer  An incentive spirometer is a tool that can help keep your lungs clear and active. This tool measures how well you are filling your lungs with each breath. Taking long deep breaths may help reverse or decrease the chance of developing breathing (pulmonary) problems (especially infection) following:  A long period of time when you are unable to move or be active. BEFORE THE PROCEDURE   If the spirometer includes an indicator to show your best effort, your nurse or respiratory therapist will set it to a desired goal.  If possible, sit up straight or lean slightly forward. Try not to slouch.  Hold the incentive spirometer in an upright position. INSTRUCTIONS FOR USE  3. Sit on the edge of your bed if possible, or sit up as far as you can in bed or on a chair. 4. Hold the incentive spirometer in an upright position. 5. Breathe out normally. 6. Place the mouthpiece in your mouth and seal your lips tightly around it. 7. Breathe in slowly and as deeply as possible, raising the piston or the ball toward the top of the column. 8. Hold your breath for 3-5 seconds or for as long as possible. Allow the piston or ball to fall to the bottom of the column. 9. Remove the mouthpiece from your mouth and breathe out normally. 10. Rest for a few seconds and repeat Steps 1  through 7 at least 10 times every 1-2 hours when you are awake. Take your time and take a few normal breaths between deep breaths. 11. The spirometer may include an indicator to show your best effort. Use the indicator as a goal to work toward during each repetition. 12. After each set of 10 deep breaths, practice coughing to be sure your lungs are clear. If you have an incision (the cut made at the time of surgery), support your incision when coughing by placing a pillow or rolled up towels firmly against it. Once you are able to  get out of bed, walk around indoors and cough well. You may stop using the incentive spirometer when instructed by your caregiver.  RISKS AND COMPLICATIONS  Take your time so you do not get dizzy or light-headed.  If you are in pain, you may need to take or ask for pain medication before doing incentive spirometry. It is harder to take a deep breath if you are having pain. AFTER USE  Rest and breathe slowly and easily.  It can be helpful to keep track of a log of your progress. Your caregiver can provide you with a simple table to help with this. If you are using the spirometer at home, follow these instructions: [place] IF:   You are having difficultly using the spirometer.  You have trouble using the spirometer as often as instructed.  Your pain medication is not giving enough relief while using the spirometer.  You develop fever of 100.5 F (38.1 C) or higher. SEEK IMMEDIATE MEDICAL CARE IF:   You cough up bloody sputum that had not been present before.  You develop fever of 102 F (38.9 C) or greater.  You develop worsening pain at or near the incision site. MAKE SURE YOU:   Understand these instructions.  Will watch your condition.  Will get help right away if you are not doing well or get worse. Document Released: 01/25/2007 Document Revised: 12/07/2011 Document Reviewed: 03/28/2007 [facility] Patient Information 2014 [place], [place].   ________________________________________________________________________

## 2016-03-25 ENCOUNTER — Telehealth: Payer: Self-pay | Admitting: Gynecologic Oncology

## 2016-03-25 ENCOUNTER — Encounter (HOSPITAL_COMMUNITY): Payer: Self-pay

## 2016-03-25 ENCOUNTER — Encounter (HOSPITAL_COMMUNITY)
Admission: RE | Admit: 2016-03-25 | Discharge: 2016-03-25 | Disposition: A | Discharge status: Home / Self Care | Payer: Medicare Other | Source: Ambulatory Visit | Attending: Gynecologic Oncology | Admitting: Gynecologic Oncology

## 2016-03-25 DIAGNOSIS — D271 Benign neoplasm of left ovary: Secondary | ICD-10-CM | POA: Diagnosis not present

## 2016-03-25 DIAGNOSIS — R600 Localized edema: Secondary | ICD-10-CM | POA: Diagnosis not present

## 2016-03-25 DIAGNOSIS — I10 Essential (primary) hypertension: Secondary | ICD-10-CM | POA: Diagnosis not present

## 2016-03-25 DIAGNOSIS — C775 Secondary and unspecified malignant neoplasm of intrapelvic lymph nodes: Secondary | ICD-10-CM | POA: Diagnosis not present

## 2016-03-25 DIAGNOSIS — Z96653 Presence of artificial knee joint, bilateral: Secondary | ICD-10-CM | POA: Diagnosis not present

## 2016-03-25 DIAGNOSIS — K219 Gastro-esophageal reflux disease without esophagitis: Secondary | ICD-10-CM | POA: Diagnosis not present

## 2016-03-25 DIAGNOSIS — Z79899 Other long term (current) drug therapy: Secondary | ICD-10-CM | POA: Diagnosis not present

## 2016-03-25 DIAGNOSIS — C541 Malignant neoplasm of endometrium: Secondary | ICD-10-CM | POA: Diagnosis not present

## 2016-03-25 DIAGNOSIS — G4733 Obstructive sleep apnea (adult) (pediatric): Secondary | ICD-10-CM | POA: Diagnosis not present

## 2016-03-25 DIAGNOSIS — Z86718 Personal history of other venous thrombosis and embolism: Secondary | ICD-10-CM | POA: Diagnosis not present

## 2016-03-25 DIAGNOSIS — R0602 Shortness of breath: Secondary | ICD-10-CM | POA: Diagnosis not present

## 2016-03-25 DIAGNOSIS — F329 Major depressive disorder, single episode, unspecified: Secondary | ICD-10-CM | POA: Diagnosis not present

## 2016-03-25 DIAGNOSIS — E039 Hypothyroidism, unspecified: Secondary | ICD-10-CM | POA: Diagnosis not present

## 2016-03-25 DIAGNOSIS — Z6841 Body Mass Index (BMI) 40.0 and over, adult: Secondary | ICD-10-CM | POA: Diagnosis not present

## 2016-03-25 HISTORY — DX: Hypothyroidism, unspecified: E03.9

## 2016-03-25 HISTORY — DX: Pneumonia, unspecified organism: J18.9

## 2016-03-25 HISTORY — DX: Nocturia: R35.1

## 2016-03-25 HISTORY — DX: Frequency of micturition: R35.0

## 2016-03-25 LAB — SURGICAL PCR SCREEN
MRSA, PCR: NEGATIVE
STAPHYLOCOCCUS AUREUS: NEGATIVE

## 2016-03-25 LAB — URINALYSIS, ROUTINE W REFLEX MICROSCOPIC
Bilirubin Urine: NEGATIVE
Glucose, UA: NEGATIVE mg/dL
Ketones, ur: NEGATIVE mg/dL
NITRITE: NEGATIVE
PH: 7 (ref 5.0–8.0)
Protein, ur: NEGATIVE mg/dL
SPECIFIC GRAVITY, URINE: 1.025 (ref 1.005–1.030)

## 2016-03-25 LAB — CBC
HEMATOCRIT: 41.5 % (ref 36.0–46.0)
HEMOGLOBIN: 13.6 g/dL (ref 12.0–15.0)
MCH: 30.4 pg (ref 26.0–34.0)
MCHC: 32.8 g/dL (ref 30.0–36.0)
MCV: 92.8 fL (ref 78.0–100.0)
Platelets: 358 10*3/uL (ref 150–400)
RBC: 4.47 MIL/uL (ref 3.87–5.11)
RDW: 13.7 % (ref 11.5–15.5)
WBC: 6.4 10*3/uL (ref 4.0–10.5)

## 2016-03-25 LAB — URINE MICROSCOPIC-ADD ON

## 2016-03-25 NOTE — Telephone Encounter (Signed)
Attempted to call patient with CT scan results.  Left message asking her to please call our office.

## 2016-03-25 NOTE — Progress Notes (Addendum)
CBC/DIFF/BMP/BNP done 03/19/2016.  Since patient had episode of bleeding since 03/19/2016 MD visit I obtained a repeat CBC prior to surgery on 03/26/2016.   EKG-03/03/16- EPIC  CT chest-03/24/16- EPIC

## 2016-03-25 NOTE — Progress Notes (Addendum)
Made OB/GYN office ( spoke with Robin ) and left message that patient had a granola bar this am along with yogurt.  No new orders given.  OB/GYN office made aware that U/A andmicro results from 03/25/16 routed via EPIC to them. Melissa Cross,Np returned call and no new orders given .

## 2016-03-26 ENCOUNTER — Ambulatory Visit (HOSPITAL_COMMUNITY): Payer: Medicare Other | Admitting: Certified Registered Nurse Anesthetist

## 2016-03-26 ENCOUNTER — Encounter (HOSPITAL_COMMUNITY): Payer: Self-pay | Admitting: *Deleted

## 2016-03-26 ENCOUNTER — Encounter (HOSPITAL_COMMUNITY)
Admission: RE | Disposition: A | Discharge status: Home / Self Care | Payer: Self-pay | Source: Ambulatory Visit | Attending: Gynecologic Oncology

## 2016-03-26 ENCOUNTER — Ambulatory Visit (HOSPITAL_COMMUNITY)
Admission: RE | Admit: 2016-03-26 | Discharge: 2016-03-27 | Disposition: A | Discharge status: Home / Self Care | Payer: Medicare Other | Source: Ambulatory Visit | Attending: Gynecologic Oncology | Admitting: Gynecologic Oncology

## 2016-03-26 DIAGNOSIS — K219 Gastro-esophageal reflux disease without esophagitis: Secondary | ICD-10-CM | POA: Insufficient documentation

## 2016-03-26 DIAGNOSIS — C775 Secondary and unspecified malignant neoplasm of intrapelvic lymph nodes: Secondary | ICD-10-CM | POA: Insufficient documentation

## 2016-03-26 DIAGNOSIS — I10 Essential (primary) hypertension: Secondary | ICD-10-CM | POA: Diagnosis not present

## 2016-03-26 DIAGNOSIS — C541 Malignant neoplasm of endometrium: Secondary | ICD-10-CM | POA: Diagnosis present

## 2016-03-26 DIAGNOSIS — D271 Benign neoplasm of left ovary: Secondary | ICD-10-CM | POA: Diagnosis not present

## 2016-03-26 DIAGNOSIS — Z96653 Presence of artificial knee joint, bilateral: Secondary | ICD-10-CM | POA: Insufficient documentation

## 2016-03-26 DIAGNOSIS — R600 Localized edema: Secondary | ICD-10-CM | POA: Diagnosis not present

## 2016-03-26 DIAGNOSIS — Z86718 Personal history of other venous thrombosis and embolism: Secondary | ICD-10-CM | POA: Diagnosis not present

## 2016-03-26 DIAGNOSIS — Z6841 Body Mass Index (BMI) 40.0 and over, adult: Secondary | ICD-10-CM | POA: Insufficient documentation

## 2016-03-26 DIAGNOSIS — R0602 Shortness of breath: Secondary | ICD-10-CM | POA: Diagnosis not present

## 2016-03-26 DIAGNOSIS — C542 Malignant neoplasm of myometrium: Secondary | ICD-10-CM | POA: Diagnosis not present

## 2016-03-26 DIAGNOSIS — E119 Type 2 diabetes mellitus without complications: Secondary | ICD-10-CM | POA: Diagnosis not present

## 2016-03-26 DIAGNOSIS — E039 Hypothyroidism, unspecified: Secondary | ICD-10-CM | POA: Insufficient documentation

## 2016-03-26 DIAGNOSIS — Z79899 Other long term (current) drug therapy: Secondary | ICD-10-CM | POA: Insufficient documentation

## 2016-03-26 DIAGNOSIS — F329 Major depressive disorder, single episode, unspecified: Secondary | ICD-10-CM | POA: Insufficient documentation

## 2016-03-26 DIAGNOSIS — C55 Malignant neoplasm of uterus, part unspecified: Secondary | ICD-10-CM | POA: Diagnosis present

## 2016-03-26 DIAGNOSIS — G4733 Obstructive sleep apnea (adult) (pediatric): Secondary | ICD-10-CM | POA: Insufficient documentation

## 2016-03-26 HISTORY — PX: ROBOTIC ASSISTED TOTAL HYSTERECTOMY WITH BILATERAL SALPINGO OOPHERECTOMY: SHX6086

## 2016-03-26 HISTORY — PX: LYMPH NODE BIOPSY: SHX201

## 2016-03-26 LAB — TYPE AND SCREEN
ABO/RH(D): A NEG
Antibody Screen: NEGATIVE

## 2016-03-26 LAB — HEMOGLOBIN A1C
HEMOGLOBIN A1C: 5.8 % — AB (ref 4.8–5.6)
MEAN PLASMA GLUCOSE: 120 mg/dL

## 2016-03-26 LAB — GLUCOSE, CAPILLARY
GLUCOSE-CAPILLARY: 162 mg/dL — AB (ref 65–99)
GLUCOSE-CAPILLARY: 176 mg/dL — AB (ref 65–99)

## 2016-03-26 SURGERY — HYSTERECTOMY, TOTAL, ROBOT-ASSISTED, LAPAROSCOPIC, WITH BILATERAL SALPINGO-OOPHORECTOMY
Anesthesia: General

## 2016-03-26 MED ORDER — SUGAMMADEX SODIUM 200 MG/2ML IV SOLN
INTRAVENOUS | Status: AC
  Filled 2016-03-26: qty 2

## 2016-03-26 MED ORDER — SUGAMMADEX SODIUM 200 MG/2ML IV SOLN
INTRAVENOUS | Status: DC | PRN
  Administered 2016-03-26: 200 mg via INTRAVENOUS

## 2016-03-26 MED ORDER — LABETALOL HCL 5 MG/ML IV SOLN
INTRAVENOUS | Status: AC
  Administered 2016-03-26: 2.5 mg via INTRAVENOUS
  Filled 2016-03-26: qty 4

## 2016-03-26 MED ORDER — LIDOCAINE HCL (CARDIAC) 20 MG/ML IV SOLN
INTRAVENOUS | Status: AC
  Filled 2016-03-26: qty 5

## 2016-03-26 MED ORDER — FENTANYL CITRATE (PF) 100 MCG/2ML IJ SOLN: 25.0000 ug | INTRAMUSCULAR | Status: DC | PRN

## 2016-03-26 MED ORDER — IBUPROFEN 400 MG PO TABS: 600.0000 mg | ORAL_TABLET | Freq: Three times a day (TID) | ORAL | Status: DC | PRN

## 2016-03-26 MED ORDER — DEXAMETHASONE SODIUM PHOSPHATE 10 MG/ML IJ SOLN
INTRAMUSCULAR | Status: DC | PRN
  Administered 2016-03-26: 6 mg via INTRAVENOUS

## 2016-03-26 MED ORDER — LIDOCAINE HCL (CARDIAC) 20 MG/ML IV SOLN
INTRAVENOUS | Status: DC | PRN
Start: 2016-03-26 — End: 2016-03-26
  Administered 2016-03-26: 100 mg via INTRAVENOUS

## 2016-03-26 MED ORDER — KCL IN DEXTROSE-NACL 20-5-0.45 MEQ/L-%-% IV SOLN
INTRAVENOUS | Status: DC
  Administered 2016-03-26: 1000 mL via INTRAVENOUS
  Filled 2016-03-26 (×2): qty 1000

## 2016-03-26 MED ORDER — FENTANYL CITRATE (PF) 250 MCG/5ML IJ SOLN
INTRAMUSCULAR | Status: AC
  Filled 2016-03-26: qty 5

## 2016-03-26 MED ORDER — HYDROMORPHONE HCL 1 MG/ML IJ SOLN: 0.2000 mg | INTRAMUSCULAR | Status: DC | PRN

## 2016-03-26 MED ORDER — FENTANYL CITRATE (PF) 100 MCG/2ML IJ SOLN
INTRAMUSCULAR | Status: AC
  Filled 2016-03-26: qty 2

## 2016-03-26 MED ORDER — ONDANSETRON HCL 4 MG/2ML IJ SOLN: 4.0000 mg | Freq: Once | INTRAMUSCULAR | Status: DC | PRN

## 2016-03-26 MED ORDER — ONDANSETRON HCL 4 MG PO TABS
4.0000 mg | ORAL_TABLET | Freq: Four times a day (QID) | ORAL | Status: DC | PRN
Start: 2016-03-26 — End: 2016-03-27

## 2016-03-26 MED ORDER — ENOXAPARIN SODIUM 40 MG/0.4ML ~~LOC~~ SOLN
40.0000 mg | SUBCUTANEOUS | Status: AC
Start: 2016-03-26 — End: 2016-03-26
  Administered 2016-03-26: 40 mg via SUBCUTANEOUS
  Filled 2016-03-26: qty 0.4

## 2016-03-26 MED ORDER — KCL IN DEXTROSE-NACL 20-5-0.45 MEQ/L-%-% IV SOLN
INTRAVENOUS | Status: AC
  Administered 2016-03-26: 1000 mL via INTRAVENOUS
  Filled 2016-03-26: qty 1000

## 2016-03-26 MED ORDER — PROPOFOL 10 MG/ML IV BOLUS
INTRAVENOUS | Status: AC
  Filled 2016-03-26: qty 20

## 2016-03-26 MED ORDER — CEFAZOLIN SODIUM-DEXTROSE 2-4 GM/100ML-% IV SOLN
2.0000 g | INTRAVENOUS | Status: AC
  Administered 2016-03-26: 2 g via INTRAVENOUS

## 2016-03-26 MED ORDER — ONDANSETRON HCL 4 MG/2ML IJ SOLN
INTRAMUSCULAR | Status: DC | PRN
  Administered 2016-03-26: 4 mg via INTRAVENOUS

## 2016-03-26 MED ORDER — ENOXAPARIN SODIUM 40 MG/0.4ML ~~LOC~~ SOLN
40.0000 mg | SUBCUTANEOUS | Status: DC
  Administered 2016-03-27: 40 mg via SUBCUTANEOUS
  Filled 2016-03-26: qty 0.4

## 2016-03-26 MED ORDER — ROCURONIUM BROMIDE 100 MG/10ML IV SOLN
INTRAVENOUS | Status: AC
  Filled 2016-03-26: qty 1

## 2016-03-26 MED ORDER — PROPOFOL 10 MG/ML IV BOLUS
INTRAVENOUS | Status: DC | PRN
  Administered 2016-03-26: 150 mg via INTRAVENOUS
  Administered 2016-03-26: 10 mg via INTRAVENOUS

## 2016-03-26 MED ORDER — LEVOTHYROXINE SODIUM 125 MCG PO TABS
125.0000 ug | ORAL_TABLET | Freq: Every day | ORAL | Status: DC
  Administered 2016-03-27: 125 ug via ORAL
  Filled 2016-03-26: qty 1

## 2016-03-26 MED ORDER — LACTATED RINGERS IV SOLN
INTRAVENOUS | Status: DC
  Administered 2016-03-26 (×2): via INTRAVENOUS

## 2016-03-26 MED ORDER — CEFAZOLIN SODIUM-DEXTROSE 2-4 GM/100ML-% IV SOLN
INTRAVENOUS | Status: AC
  Filled 2016-03-26: qty 100

## 2016-03-26 MED ORDER — STERILE WATER FOR INJECTION IJ SOLN
INTRAMUSCULAR | Status: AC
  Filled 2016-03-26: qty 10

## 2016-03-26 MED ORDER — GABAPENTIN 300 MG PO CAPS
300.0000 mg | ORAL_CAPSULE | Freq: Every day | ORAL | Status: AC
  Administered 2016-03-26: 300 mg via ORAL
  Filled 2016-03-26: qty 1

## 2016-03-26 MED ORDER — LACTATED RINGERS IR SOLN
Status: DC | PRN
  Administered 2016-03-26: 1000 mL

## 2016-03-26 MED ORDER — OXYCODONE-ACETAMINOPHEN 5-325 MG PO TABS
1.0000 | ORAL_TABLET | ORAL | Status: DC | PRN
  Administered 2016-03-26 – 2016-03-27 (×2): 1 via ORAL
  Filled 2016-03-26 (×2): qty 1

## 2016-03-26 MED ORDER — LISINOPRIL 20 MG PO TABS
20.0000 mg | ORAL_TABLET | Freq: Every day | ORAL | Status: DC
  Administered 2016-03-27: 20 mg via ORAL
  Filled 2016-03-26: qty 1

## 2016-03-26 MED ORDER — FENTANYL CITRATE (PF) 100 MCG/2ML IJ SOLN
INTRAMUSCULAR | Status: DC | PRN
  Administered 2016-03-26 (×2): 50 ug via INTRAVENOUS
  Administered 2016-03-26: 100 ug via INTRAVENOUS
  Administered 2016-03-26: 50 ug via INTRAVENOUS

## 2016-03-26 MED ORDER — SODIUM CHLORIDE 0.9 % IJ SOLN
INTRAMUSCULAR | Status: AC
  Filled 2016-03-26: qty 10

## 2016-03-26 MED ORDER — ALLOPURINOL 300 MG PO TABS
300.0000 mg | ORAL_TABLET | Freq: Every day | ORAL | Status: DC
  Administered 2016-03-27: 300 mg via ORAL
  Filled 2016-03-26: qty 1

## 2016-03-26 MED ORDER — CITALOPRAM HYDROBROMIDE 20 MG PO TABS
10.0000 mg | ORAL_TABLET | Freq: Every day | ORAL | Status: DC
  Administered 2016-03-26 – 2016-03-27 (×2): 10 mg via ORAL
  Filled 2016-03-26 (×2): qty 1

## 2016-03-26 MED ORDER — EPHEDRINE SULFATE 50 MG/ML IJ SOLN
INTRAMUSCULAR | Status: AC
  Filled 2016-03-26: qty 1

## 2016-03-26 MED ORDER — TRAMADOL HCL 50 MG PO TABS: 100.0000 mg | ORAL_TABLET | Freq: Four times a day (QID) | ORAL | Status: DC | PRN

## 2016-03-26 MED ORDER — ONDANSETRON HCL 4 MG/2ML IJ SOLN: 4.0000 mg | Freq: Four times a day (QID) | INTRAMUSCULAR | Status: DC | PRN

## 2016-03-26 MED ORDER — ONDANSETRON HCL 4 MG/2ML IJ SOLN
INTRAMUSCULAR | Status: AC
  Filled 2016-03-26: qty 2

## 2016-03-26 MED ORDER — ROCURONIUM BROMIDE 100 MG/10ML IV SOLN
INTRAVENOUS | Status: DC | PRN
  Administered 2016-03-26: 10 mg via INTRAVENOUS
  Administered 2016-03-26: 40 mg via INTRAVENOUS
  Administered 2016-03-26: 20 mg via INTRAVENOUS
  Administered 2016-03-26: 10 mg via INTRAVENOUS

## 2016-03-26 MED ORDER — OMEPRAZOLE 20 MG PO CPDR
20.0000 mg | DELAYED_RELEASE_CAPSULE | Freq: Every day | ORAL | Status: DC
  Administered 2016-03-27: 20 mg via ORAL
  Filled 2016-03-26: qty 1

## 2016-03-26 SURGICAL SUPPLY — 60 items
APL ESCP 34 STRL LF DISP (HEMOSTASIS)
APPLICATOR SURGIFLO ENDO (HEMOSTASIS) IMPLANT
BAG SPEC RTRVL LRG 6X4 10 (ENDOMECHANICALS)
CHLORAPREP W/TINT 26ML (MISCELLANEOUS) ×3 IMPLANT
COVER SURGICAL LIGHT HANDLE (MISCELLANEOUS) ×3 IMPLANT
COVER TIP SHEARS 8 DVNC (MISCELLANEOUS) ×1 IMPLANT
COVER TIP SHEARS 8MM DA VINCI (MISCELLANEOUS) ×2
DRAPE ARM DVNC X/XI (DISPOSABLE) ×4 IMPLANT
DRAPE COLUMN DVNC XI (DISPOSABLE) ×1 IMPLANT
DRAPE DA VINCI XI ARM (DISPOSABLE) ×8
DRAPE DA VINCI XI COLUMN (DISPOSABLE) ×2
DRAPE SHEET LG 3/4 BI-LAMINATE (DRAPES) ×6 IMPLANT
DRAPE SURG IRRIG POUCH 19X23 (DRAPES) ×3 IMPLANT
ELECT REM PT RETURN 15FT ADLT (MISCELLANEOUS) ×3 IMPLANT
GLOVE BIO SURGEON STRL SZ 6 (GLOVE) ×12 IMPLANT
GLOVE BIO SURGEON STRL SZ 6.5 (GLOVE) ×4 IMPLANT
GLOVE BIO SURGEONS STRL SZ 6.5 (GLOVE) ×2
GOWN STRL REUS W/ TWL LRG LVL3 (GOWN DISPOSABLE) ×2 IMPLANT
GOWN STRL REUS W/TWL LRG LVL3 (GOWN DISPOSABLE) ×6
HOLDER FOLEY CATH W/STRAP (MISCELLANEOUS) ×3 IMPLANT
KIT BASIN OR (CUSTOM PROCEDURE TRAY) ×3 IMPLANT
KIT PROCEDURE DA VINCI SI (MISCELLANEOUS) ×2
KIT PROCEDURE DVNC SI (MISCELLANEOUS) IMPLANT
LIQUID BAND (GAUZE/BANDAGES/DRESSINGS) ×3 IMPLANT
MANIPULATOR UTERINE 4.5 ZUMI (MISCELLANEOUS) ×3 IMPLANT
MARKER SKIN DUAL TIP RULER LAB (MISCELLANEOUS) ×3 IMPLANT
NDL SAFETY ECLIPSE 18X1.5 (NEEDLE) ×1 IMPLANT
NDL SPNL 18GX3.5 QUINCKE PK (NEEDLE) ×1 IMPLANT
NEEDLE HYPO 18GX1.5 SHARP (NEEDLE) ×3
NEEDLE SPNL 18GX3.5 QUINCKE PK (NEEDLE) ×3 IMPLANT
OBTURATOR XI 8MM BLADELESS (TROCAR) ×3 IMPLANT
OCCLUDER COLPOPNEUMO (BALLOONS) ×3 IMPLANT
PAD POSITIONING PINK XL (MISCELLANEOUS) ×3 IMPLANT
PORT ACCESS TROCAR AIRSEAL 12 (TROCAR) ×1 IMPLANT
PORT ACCESS TROCAR AIRSEAL 5M (TROCAR) ×2
POUCH ENDO CATCH II 15MM (MISCELLANEOUS) IMPLANT
POUCH SPECIMEN RETRIEVAL 10MM (ENDOMECHANICALS) IMPLANT
SEAL CANN UNIV 5-8 DVNC XI (MISCELLANEOUS) ×4 IMPLANT
SEAL XI 5MM-8MM UNIVERSAL (MISCELLANEOUS) ×8
SET TRI-LUMEN FLTR TB AIRSEAL (TUBING) ×3 IMPLANT
SET TUBE IRRIG SUCTION NO TIP (IRRIGATION / IRRIGATOR) ×3 IMPLANT
SHEET LAVH (DRAPES) ×3 IMPLANT
SOLUTION ELECTROLUBE (MISCELLANEOUS) ×3 IMPLANT
SURGIFLO W/THROMBIN 8M KIT (HEMOSTASIS) IMPLANT
SUT MNCRL AB 4-0 PS2 18 (SUTURE) ×6 IMPLANT
SUT VIC AB 0 CT1 27 (SUTURE) ×3
SUT VIC AB 0 CT1 27XBRD ANTBC (SUTURE) ×1 IMPLANT
SUT VIC AB 3-0 SH 27 (SUTURE) ×6
SUT VIC AB 3-0 SH 27XBRD (SUTURE) IMPLANT
SYR 50ML LL SCALE MARK (SYRINGE) ×3 IMPLANT
SYRINGE 10CC LL (SYRINGE) ×3 IMPLANT
TOWEL OR 17X26 10 PK STRL BLUE (TOWEL DISPOSABLE) ×6 IMPLANT
TOWEL OR NON WOVEN STRL DISP B (DISPOSABLE) ×3 IMPLANT
TRAP SPECIMEN MUCOUS 40CC (MISCELLANEOUS) IMPLANT
TRAY FOLEY W/METER SILVER 14FR (SET/KITS/TRAYS/PACK) ×3 IMPLANT
TRAY LAPAROSCOPIC (CUSTOM PROCEDURE TRAY) ×3 IMPLANT
TROCAR BLADELESS OPT 5 100 (ENDOMECHANICALS) ×3 IMPLANT
UNDERPAD 30X30 (UNDERPADS AND DIAPERS) ×3 IMPLANT
UNDERPAD 30X30 INCONTINENT (UNDERPADS AND DIAPERS) ×3 IMPLANT
WATER STERILE IRR 1500ML POUR (IV SOLUTION) ×6 IMPLANT

## 2016-03-26 NOTE — Anesthesia Preprocedure Evaluation (Addendum)
Anesthesia Evaluation  Patient identified by MRN, date of birth, ID band Patient awake    Reviewed: Allergy & Precautions, NPO status , Patient's Chart, lab work & pertinent test results  Airway Mallampati: III  TM Distance: >3 FB Neck ROM: Full    Dental  (+) Teeth Intact, Dental Advisory Given   Pulmonary sleep apnea and Continuous Positive Airway Pressure Ventilation ,    Pulmonary exam normal breath sounds clear to auscultation       Cardiovascular hypertension, Pt. on medications Normal cardiovascular exam Rhythm:Regular Rate:Normal     Neuro/Psych PSYCHIATRIC DISORDERS Depression negative neurological ROS     GI/Hepatic Neg liver ROS, GERD  Medicated and Controlled,  Endo/Other  diabetes, Well ControlledHypothyroidism Morbid obesity  Renal/GU negative Renal ROS   Endometrial cancer     Musculoskeletal  (+) Arthritis ,   Abdominal   Peds  Hematology   Anesthesia Other Findings Day of surgery medications reviewed with the patient.  Reproductive/Obstetrics                           Anesthesia Physical Anesthesia Plan  ASA: III  Anesthesia Plan: General   Post-op Pain Management:    Induction: Intravenous  Airway Management Planned: Oral ETT  Additional Equipment:   Intra-op Plan:   Post-operative Plan: Extubation in OR  Informed Consent: I have reviewed the patients History and Physical, chart, labs and discussed the procedure including the risks, benefits and alternatives for the proposed anesthesia with the patient or authorized representative who has indicated his/her understanding and acceptance.   Dental advisory given  Plan Discussed with: CRNA  Anesthesia Plan Comments: (Risks/benefits of general anesthesia discussed with patient including risk of damage to teeth, lips, gum, and tongue, nausea/vomiting, allergic reactions to medications, and the possibility of  heart attack, stroke and death.  All patient questions answered.  Patient wishes to proceed.)        Anesthesia Quick Evaluation

## 2016-03-26 NOTE — Transfer of Care (Signed)
Immediate Anesthesia Transfer of Care Note  Patient: Melinda Kerr  Procedure(s) Performed: Procedure(s): XI ROBOTIC ASSISTED TOTAL Laproscopic HYSTERECTOMY WITH BILATERAL SALPINGO OOPHORECTOMY (N/A) Sentinel LYMPH NODE BIOPSY (N/A)  Patient Location: PACU  Anesthesia Type:General  Level of Consciousness:  sedated, patient cooperative and responds to stimulation  Airway & Oxygen Therapy:Patient Spontanous Breathing and Patient connected to face mask oxgen  Post-op Assessment:  Report given to PACU RN and Post -op Vital signs reviewed and stable  Post vital signs:  Reviewed and stable  Last Vitals:  Filed Vitals:   03/26/16 0741  BP: 140/70  Pulse: 90  Temp: 36.6 C  Resp: 16    Complications: No apparent anesthesia complications

## 2016-03-26 NOTE — H&P (View-Only) (Signed)
Consult Note: Gyn-Onc  Consult was requested by Dr. Benjie Karvonen for the evaluation of Ruhee L Fredderick Severance 81 y.o. female  CC:  Chief Complaint  Patient presents with  . Uterine Cancer    New Consultation    Assessment/Plan:  Ms. AZRIELLE CENTNER  is a 81 y.o.  year old morbidly obese woman (BMI 44) with high grade serous endometrial cancer.   She has SOB on exertion and peripheral edema.    A detailed discussion was held with the patient and her family with regard to to her endometrial cancer diagnosis. We discussed the standard management options for uterine cancer which includes surgery followed possibly by adjuvant therapy depending on the results of surgery. The options for surgical management include a hysterectomy and removal of the tubes and ovaries possibly with removal of pelvic and para-aortic lymph nodes. A minimally invasive approach including a robotic hysterectomy or laparoscopic hysterectomy have benefits including shorter hospital stay, recovery time and better wound healing. The patient has been counseled about these surgical options and the risks of surgery in general including infection, bleeding, damage to surrounding structures (including bowel, bladder, ureters, nerves or vessels), and the postoperative risks of PE/ DVT, and lymphedema. I explained these risks are increased in morbidly obese patients such as Ms Jarecki. I extensively reviewed the additional risks of robotic hysterectomy including possible need for conversion to open laparotomy.  I discussed positioning during surgery of trendelenberg and risks of minor facial swelling and care we take in preoperative positioning.  After counseling and consideration of her options, she desires to proceed with robotic assisted total hysterectomy, BSO, SLN biopsy.   She will be seen by anesthesia for preoperative clearance and discussion of postoperative pain management.  She was given the opportunity to ask questions, which were answered  to her satisfaction, and she is agreement with the above mentioned plan of care.  Due to her high grade cancer I have ordered CT imaging of the chest, abdo/pelvis to evaluate for metastatic disease.  Due to her history of prior DVT, I recommend 2 weeks of lovenox postop for prophylaxis.  I recommended she stop her ASA now, and explained recent ASA use increases perioperative bleeding risk.  I am concerned about her dyspnea on exertion and therefore recommend checking BNP and CXR to rule out occult CHF (she has intermitent LE edema).   If no signs of distant mets and the patient is doing well from a cardiac standpoint, we will plan for surgery on 03/26/16. I explained that due to the high grade nature of her tumor she will almost certainly require postop adjuvant chemotherapy and radiation regardless of stage due to the high risk for recurrence associated with serous endometrial cancer.  HPI: Ashari Morreale is a 81 year old G0 who is seen in consultation of Dr Benjie Karvonen for serous endometrial cancer in the setting of morbid obesity (BMI 44). The patient reports a history of postmenopausal bleeding on 01/28/16. She then saw Dr Benjie Karvonen who performed a TVUS on 02/13/16 which revealed 6.2 x 5.3 x 4cm and a thickened endometrial stripe. There was an echogenic mass measuring 22.8cm felt to represent a dermoid cyst on the left ovary. The right ovary was normal. A D&C was performed on 03/04/16 and showed high grade serous carcinoma.   She has morbid obesity with a BMI of 44kg/m2. She has HTN, and a remote history of a spontaneous DVT treated with 6 months of coumadin. She has chronic back pain and scoliosis. The patient's  surgical history is most notable for a laparoscopic cholecystectomy and an umbilical hernia repair without mesh in 2007.  Current Meds:  Outpatient Encounter Prescriptions as of 03/19/2016  Medication Sig  . acetaminophen (TYLENOL) 500 MG tablet Take 1,000 mg by mouth 2 (two) times daily.   Marland Kitchen  allopurinol (ZYLOPRIM) 300 MG tablet Take 300 mg by mouth daily with breakfast.   . cetirizine (ZYRTEC) 10 MG tablet Take 10 mg by mouth daily.  . citalopram (CELEXA) 10 MG tablet Take 10 mg by mouth daily.  . furosemide (LASIX) 20 MG tablet Take 20 mg by mouth daily.  . hydrOXYzine (ATARAX/VISTARIL) 25 MG tablet Take 25 mg by mouth at bedtime.  Marland Kitchen levothyroxine (SYNTHROID, LEVOTHROID) 125 MCG tablet Take 125 mcg by mouth daily before breakfast.  . lisinopril (PRINIVIL,ZESTRIL) 20 MG tablet Take 20 mg by mouth daily with breakfast.   . omeprazole (PRILOSEC OTC) 20 MG tablet Take 20 mg by mouth daily.  Marland Kitchen OVER THE COUNTER MEDICATION Take 1 tablet by mouth daily. Iron supplement  . polyethylene glycol (MIRALAX / GLYCOLAX) packet Take 17 g by mouth daily as needed for mild constipation or moderate constipation.  Marland Kitchen tolterodine (DETROL LA) 4 MG 24 hr capsule Take 4 mg by mouth daily.  . traMADol (ULTRAM) 50 MG tablet Take 1-2 tablets (50-100 mg total) by mouth every 6 (six) hours as needed (mild pain).  . [DISCONTINUED] ranitidine (ZANTAC) 300 MG tablet Take 300 mg by mouth at bedtime.  . methocarbamol (ROBAXIN) 500 MG tablet Take 1 tablet (500 mg total) by mouth every 6 (six) hours as needed for muscle spasms. (Patient not taking: Reported on 03/19/2016)  . norethindrone (AYGESTIN) 5 MG tablet Take 1 tablet (5 mg total) by mouth daily.  . [DISCONTINUED] ibuprofen (ADVIL) 200 MG tablet Take 2 tablets (400 mg total) by mouth every 6 (six) hours as needed.  . [DISCONTINUED] omeprazole (PRILOSEC) 20 MG capsule Take 20 mg by mouth daily.  . [DISCONTINUED] simvastatin (ZOCOR) 20 MG tablet Take 20 mg by mouth daily.  . [DISCONTINUED] simvastatin (ZOCOR) 40 MG tablet Take 20 mg by mouth at bedtime.   No facility-administered encounter medications on file as of 03/19/2016.    Allergy:  Allergies  Allergen Reactions  . Oxybutynin     Bleeding gums, felt sick    Social Hx:   Social History   Social  History  . Marital Status: Widowed    Spouse Name: N/A  . Number of Children: N/A  . Years of Education: N/A   Occupational History  . Not on file.   Social History Main Topics  . Smoking status: Never Smoker   . Smokeless tobacco: Never Used  . Alcohol Use: No  . Drug Use: No  . Sexual Activity: Not Currently   Other Topics Concern  . Not on file   Social History Narrative    Past Surgical Hx:  Past Surgical History  Procedure Laterality Date  . Tubal ligation  age 71  . Varicose vein surgery Bilateral age 76  . Wisdom tooth extraction  age 61  . Hernia repair  age 28    umblical  . Cholecystectomy  age 65  . Total knee arthroplasty Left 10/09/2013    Procedure: LEFT TOTAL KNEE ARTHROPLASTY;  Surgeon: [name], MD;  Location: [facility];  Service: Orthopedics;  Laterality: Left;  . Total knee arthroplasty Right 04/09/2014    Procedure: RIGHT TOTAL KNEE ARTHROPLASTY;  Surgeon: [name], MD;  Location:  [facility];  Service: Orthopedics;  Laterality: Right;  . Dilatation & curettage/hysteroscopy with myosure N/A 03/04/2016    Procedure: DILATATION & CURETTAGE/HYSTEROSCOPY ;  Surgeon: Azucena Fallen, MD;  Location: [facility];  Service: Gynecology;  Laterality: N/A;  polyp removal     Past Medical Hx:  Past Medical History  Diagnosis Date  . Hyperlipidemia     under control  . Depression   . OSA on CPAP   . Spondylosis     with scoliosis  . Phlebitis     hx of  . Esophageal reflux   . Morbid obesity with BMI of 40.0-44.9, adult ([place])   . Hypertension   . Osteoarthritis   . Prediabetes     under control with diet    Past Gynecological History:  G0  No LMP recorded. Patient is postmenopausal.  Family Hx:  Family History  Problem Relation Age of Onset  . Heart disease Mother   . Heart disease Father   . CVA Father     Review of Systems:  Constitutional  Feels well,    ENT Normal appearing ears and nares bilaterally Skin/Breast  No rash, sores,  jaundice, itching, dryness Cardiovascular  No chest pain, shortness of breath, or edema  Pulmonary  No cough or wheeze.  Gastro Intestinal  No nausea, vomitting, or diarrhoea. No bright red blood per rectum, no abdominal pain, change in bowel movement, or constipation.  Genito Urinary  No frequency, urgency, dysuria, + postmenopausal bleeding Musculo Skeletal  No myalgia, arthralgia, joint swelling or pain  Neurologic  No weakness, numbness, change in gait,  Psychology  No depression, anxiety, insomnia.   Vitals:  Blood pressure 138/67, pulse 81, temperature 98.9 F (37.2 C), temperature source Oral, resp. rate 19, weight 242 lb 14.4 oz (110.179 kg), SpO2 98 %.  Physical Exam: WD in NAD Neck  Supple NROM, without any enlargements.  Lymph Node Survey No cervical supraclavicular or inguinal adenopathy Cardiovascular  Pulse normal rate, regularity and rhythm. S1 and S2 normal.  Lungs  Clear to auscultation bilateraly. + audible wheezing on exertion. Increased work of breathing when positioning on bed. Good air movement.  Skin  No rash/lesions/breakdown  Psychiatry  Alert and oriented to person, place, and time  Abdomen  Normoactive bowel sounds, abdomen soft, non-tender and obese with pannus folds without evidence of hernia.  Back No CVA tenderness Genito Urinary  Vulva/vagina: Normal external female genitalia.  No lesions. No discharge or bleeding.  Bladder/urethra:  No lesions or masses, well supported bladder  Vagina: long and normal mucosa  Cervix: difficult to visualize. Tilted posteriorally. Palpably normal.  Uterus:  Small, mobile, no parametrial involvement or nodularity. Difficult to fully appreciate due to body habitus.  Adnexa: no palpable masses. Rectal  Good tone, no masses no cul de sac nodularity.  Extremities  No bilateral cyanosis, clubbing or edema.   Donaciano Eva, MD  03/19/2016, 4:42 PM

## 2016-03-26 NOTE — Interval H&P Note (Signed)
History and Physical Interval Note:  03/26/2016 9:50 AM  Melinda Kerr  has presented today for surgery, with the diagnosis of endometrial cancer  The various methods of treatment have been discussed with the patient and family. After consideration of risks, benefits and other options for treatment, the patient has consented to  Procedure(s): XI ROBOTIC [place] (N/A) Sentinel LYMPH NODE BIOPSY (N/A) as a surgical intervention .  The patient's history has been reviewed, patient examined, no change in status, stable for surgery. She has suspicious nodes on CT scan but no gross metastatic disease or unresectable disease.  I have reviewed the patient's chart and labs.  Questions were answered to the patient's satisfaction.    Donaciano Eva

## 2016-03-26 NOTE — Op Note (Signed)
OPERATIVE NOTE 03/26/16  Surgeon: Donaciano Eva   Assistants: Dr Lahoma Crocker (an MD assistant was necessary for tissue manipulation, management of robotic instrumentation, retraction and positioning due to the complexity of the case and hospital policies).   Anesthesia: General endotracheal anesthesia  ASA Class: 3   Pre-operative Diagnosis: serous endometrial cancer  Post-operative Diagnosis: same  Operation: Robotic-assisted laparoscopic total hysterectomy with bilateral salpingoophorectomy, sentinel lymph node biopsy  Surgeon: Donaciano Eva  Assistant Surgeon: Lahoma Crocker MD  Anesthesia: GET  Urine Output: 400  Operative Findings:  : 6cm uterus, grossly normal tubes and ovaries with mild adhesions to pelvis. Suspicious, bulky firm left obturator lymph node. Bilateral mapping. No evidence for intraperitoneal metastases.  Estimated Blood Loss:  25cc      Total IV Fluids: 600 ml         Specimens: uterus, cervix, bilateral tubes and ovaries, left obturator SLN, left external iliac SLN, left common iliac SLN, right obturator SLN         Complications:  None; patient tolerated the procedure well.         Disposition: PACU - hemodynamically stable.  Procedure Details  The patient was seen in the Holding Room. The risks, benefits, complications, treatment options, and expected outcomes were discussed with the patient.  The patient concurred with the proposed plan, giving informed consent.  The site of surgery properly noted/marked. The patient was identified as Melinda Kerr and the procedure verified as a Robotic-assisted hysterectomy with bilateral salpingo oophorectomy and SLN. A Time Out was held and the above information confirmed.  After induction of anesthesia, the patient was draped and prepped in the usual sterile manner. Pt was placed in supine position after anesthesia and draped and prepped in the usual sterile manner. The abdominal drape  was placed after the CholoraPrep had been allowed to dry for 3 minutes.  Her arms were tucked to her side with all appropriate precautions.  The shoulders were stabilized with padded shoulder blocks applied to the acromium processes.  The patient was placed in the semi-lithotomy position in [place].  The perineum was prepped with Betadine. The patient was then prepped. Foley catheter was placed.  A sterile speculum was placed in the vagina.  The cervix was grasped with a single-tooth tenaculum and dilated with Kennon Rounds dilators.  1mg  total of ICG was injected into the cervical stroma at 2 and 9 o'clock at a 50mm depth (concentration 0..5mg /ml). The ZUMI uterine manipulator with a medium colpotomizer ring was placed without difficulty.  A pneum occluder balloon was placed over the manipulator.  OG tube placement was confirmed and to suction.   Next, a 5 mm skin incision was made 1 cm below the subcostal margin in the midclavicular line.  The 5 mm Optiview port and scope was used for direct entry.  Opening pressure was under 10 mm CO2.  The abdomen was insufflated and the findings were noted as above.   At this point and all points during the procedure, the patient's intra-abdominal pressure did not exceed 15 mmHg. Next, a 10 mm skin incision was made in the umbilicus and a right and left port was placed about 10 cm lateral to the robot port on the right and left side.  A fourth arm was placed in the left lower quadrant 2 cm above and superior and medial to the anterior superior iliac spine.  All ports were placed under direct visualization.  The patient was placed in steep Trendelenburg.  Bowel was folded away into the upper abdomen.  The robot was docked in the normal manner.  The right and left peritoneum were opened parallel to the IP ligament to open the retroperitoneal spaces bilaterally. The SLN mapping was performed in bilateral pelvic basins. The para rectal and paravesical spaces were opened up.  Lymphatic channels were identified travelling to the following visualized sentinel lymph node's: left obturator, external iliac and common iliac, The left obturator node was suspicious for metastatic disease. Right obturator SLN. These SLN's were separated from their surrounding lymphatic tissue, removed and sent for permanent pathology.  The hysterectomy was started after the round ligament on the right side was incised and the retroperitoneum was entered and the pararectal space was developed.  The ureter was noted to be on the medial leaf of the broad ligament.  The peritoneum above the ureter was incised and stretched and the infundibulopelvic ligament was skeletonized, cauterized and cut.  The posterior peritoneum was taken down to the level of the KOH ring.  The anterior peritoneum was also taken down.  The bladder flap was created to the level of the KOH ring.  The uterine artery on the right side was skeletonized, cauterized and cut in the normal manner.  A similar procedure was performed on the left.  The colpotomy was made and the uterus, cervix, bilateral ovaries and tubes were amputated and delivered through the vagina.  Pedicles were inspected and excellent hemostasis was achieved.    The colpotomy at the vaginal cuff was closed with Vicryl on a CT1 needle in a running manner.  Irrigation was used and excellent hemostasis was achieved.  At this point in the procedure was completed.  Robotic instruments were removed under direct visulaization.  The robot was undocked. The 10 mm ports were closed with Vicryl on a UR-5 needle and the fascia was closed with 0 Vicryl on a UR-5 needle.  The skin was closed with 4-0 Vicryl in a subcuticular manner.  Dermabond was applied.  Sponge, lap and needle counts correct x 2.  The patient was taken to the recovery room in stable condition.  The vagina was swabbed with bleeding noted. A speculum was inserted and abrasions of the vaginal mucosa were seen in the  posterior wall and hymenal ring. These were sutured for hemostasis with 3-0 vicryl. All instrument and needle counts were correct x  3.   The patient was transferred to the recovery room in a stable condition.  Donaciano Eva, MD

## 2016-03-26 NOTE — Anesthesia Procedure Notes (Signed)
Procedure Name: Intubation Date/Time: 03/26/2016 10:59 AM Performed by: Montel Clock Pre-anesthesia Checklist: Patient identified, Emergency Drugs available, Suction available, Patient being monitored and Timeout performed Patient Re-evaluated:Patient Re-evaluated prior to inductionOxygen Delivery Method: Circle system utilized Preoxygenation: Pre-oxygenation with 100% oxygen Intubation Type: IV induction Ventilation: Mask ventilation without difficulty Laryngoscope Size: Mac and 3 Grade View: Grade I Tube type: Oral Tube size: 7.5 mm Number of attempts: 1 Airway Equipment and Method: Stylet Placement Confirmation: ETT inserted through vocal cords under direct vision,  positive ETCO2 and breath sounds checked- equal and bilateral Secured at: 21 cm Tube secured with: Tape Dental Injury: Teeth and Oropharynx as per pre-operative assessment

## 2016-03-26 NOTE — Anesthesia Postprocedure Evaluation (Signed)
Anesthesia Post Note  Patient: Kiffany L Rehman  Procedure(s) Performed: Procedure(s) (LRB): XI ROBOTIC ASSISTED TOTAL Laproscopic HYSTERECTOMY WITH BILATERAL SALPINGO OOPHORECTOMY (N/A) Sentinel LYMPH NODE BIOPSY (N/A)  Patient location during evaluation: PACU Anesthesia Type: General Level of consciousness: awake and alert Pain management: pain level controlled Vital Signs Assessment: post-procedure vital signs reviewed and stable Respiratory status: spontaneous breathing, nonlabored ventilation, respiratory function stable and patient connected to nasal cannula oxygen Cardiovascular status: blood pressure returned to baseline and stable Postop Assessment: no signs of nausea or vomiting Anesthetic complications: no    Last Vitals:  Filed Vitals:   03/26/16 1545 03/26/16 1645  BP: 107/48 139/60  Pulse: 79 81  Temp: 36.4 C 36.6 C  Resp: 16 16    Last Pain:  Filed Vitals:   03/26/16 1653  PainSc: 0-No pain                 Catalina Gravel

## 2016-03-27 DIAGNOSIS — D271 Benign neoplasm of left ovary: Secondary | ICD-10-CM | POA: Diagnosis not present

## 2016-03-27 DIAGNOSIS — C775 Secondary and unspecified malignant neoplasm of intrapelvic lymph nodes: Secondary | ICD-10-CM | POA: Diagnosis not present

## 2016-03-27 DIAGNOSIS — C541 Malignant neoplasm of endometrium: Secondary | ICD-10-CM | POA: Diagnosis not present

## 2016-03-27 DIAGNOSIS — R600 Localized edema: Secondary | ICD-10-CM | POA: Diagnosis not present

## 2016-03-27 DIAGNOSIS — R0602 Shortness of breath: Secondary | ICD-10-CM | POA: Diagnosis not present

## 2016-03-27 LAB — BASIC METABOLIC PANEL
Anion gap: 4 — ABNORMAL LOW (ref 5–15)
BUN: 14 mg/dL (ref 6–20)
CO2: 28 mmol/L (ref 22–32)
CREATININE: 0.94 mg/dL (ref 0.44–1.00)
Calcium: 9 mg/dL (ref 8.9–10.3)
Chloride: 106 mmol/L (ref 101–111)
GFR calc Af Amer: >60 mL/min (ref >60)
GFR, EST NON AFRICAN AMERICAN: 56 mL/min — AB (ref >60)
Glucose, Bld: 142 mg/dL — ABNORMAL HIGH (ref 65–99)
Potassium: 5.2 mmol/L — ABNORMAL HIGH (ref 3.5–5.1)
SODIUM: 138 mmol/L (ref 135–145)

## 2016-03-27 LAB — CBC
HCT: 38.2 % (ref 36.0–46.0)
Hemoglobin: 12.5 g/dL (ref 12.0–15.0)
MCH: 30.6 pg (ref 26.0–34.0)
MCHC: 32.7 g/dL (ref 30.0–36.0)
MCV: 93.6 fL (ref 78.0–100.0)
PLATELETS: 316 10*3/uL (ref 150–400)
RBC: 4.08 MIL/uL (ref 3.87–5.11)
RDW: 13.6 % (ref 11.5–15.5)
WBC: 13.6 10*3/uL — ABNORMAL HIGH (ref 4.0–10.5)

## 2016-03-27 LAB — GLUCOSE, CAPILLARY: GLUCOSE-CAPILLARY: 117 mg/dL — AB (ref 65–99)

## 2016-03-27 MED ORDER — DOCUSATE SODIUM 100 MG PO CAPS: 100.0000 mg | ORAL_CAPSULE | Freq: Two times a day (BID) | ORAL | Status: DC

## 2016-03-27 MED ORDER — ENOXAPARIN (LOVENOX) PATIENT EDUCATION KIT
PACK | Freq: Once | Status: AC
  Administered 2016-03-27: 11:00:00
  Filled 2016-03-27: qty 1

## 2016-03-27 MED ORDER — ENOXAPARIN SODIUM 40 MG/0.4ML ~~LOC~~ SOLN: 40.0000 mg | SUBCUTANEOUS | Status: DC

## 2016-03-27 MED ORDER — IBUPROFEN 600 MG PO TABS: 600.0000 mg | ORAL_TABLET | Freq: Three times a day (TID) | ORAL | Status: DC | PRN

## 2016-03-27 MED ORDER — OXYCODONE-ACETAMINOPHEN 5-325 MG PO TABS: 1.0000 | ORAL_TABLET | ORAL | Status: DC | PRN

## 2016-03-27 NOTE — Care Management Note (Signed)
Case Management Note  Patient Details  Name: Melinda Kerr MRN: [id number] Date of Birth: 10-19-1934  Subjective/Objective:  81 y/o f admitted w/Malignant neoplasm of uterus, s/p hysterectomy.From home.                  Action/Plan:d/c home no needs or orders.   Expected Discharge Date:                  Expected Discharge Plan:  Home/Self Care  In-House Referral:     Discharge planning Services     Post Acute Care Choice:    Choice offered to:     DME Arranged:    DME Agency:     HH Arranged:    [place] Agency:     Status of Service:  Completed, signed off  If discussed at [employer] of Stay Meetings, dates discussed:    Additional Comments:  Dessa Phi, RN 03/27/2016, 9:51 AM

## 2016-03-27 NOTE — Discharge Summary (Signed)
Physician Discharge Summary  Patient ID: Melinda Kerr MRN: [id number] DOB/AGE: April 16, 1935 81 y.o.  Admit date: 03/26/2016 Discharge date: 03/27/2016  Admission Diagnoses: Malignant neoplasm of uterus [facility])  Discharge Diagnoses:  Principal Problem:   Malignant neoplasm of uterus [facility]) Active Problems:   Endometrial cancer [facility])   Discharged Condition: good  Hospital Course: On 03/26/16 she underwent robotic assisted total hysterectomy, BSO, sentinel lymph node biopsy.   The surgery was uncomplicated and she recovered overnight. Her foley was removed on POD 1 and she voided without difficulty. On the morning of POD 1 she was meeting discharge criteria.  Consults: None  Significant Diagnostic Studies: labs:  CBC    Component Value Date/Time   WBC 13.6* 03/27/2016 0426   WBC 7.1 03/19/2016 1539   RBC 4.08 03/27/2016 0426   RBC 4.16 03/19/2016 1539   HGB 12.5 03/27/2016 0426   HGB 12.7 03/19/2016 1539   HCT 38.2 03/27/2016 0426   HCT 38.8 03/19/2016 1539   PLT 316 03/27/2016 0426   PLT 376 03/19/2016 1539   MCV 93.6 03/27/2016 0426   MCV 93.3 03/19/2016 1539   MCH 30.6 03/27/2016 0426   MCH 30.5 03/19/2016 1539   MCHC 32.7 03/27/2016 0426   MCHC 32.7 03/19/2016 1539   RDW 13.6 03/27/2016 0426   RDW 14.0 03/19/2016 1539   LYMPHSABS 1.5 03/19/2016 1539   MONOABS 0.9 03/19/2016 1539   EOSABS 0.2 03/19/2016 1539   BASOSABS 0.0 03/19/2016 1539    Treatments: surgery: see above  Discharge Exam: Blood pressure 124/65, pulse 66, temperature 97.7 F (36.5 C), temperature source Axillary, resp. rate 14, height 5\' 2"  (1.575 m), weight 239 lb (108.41 kg), SpO2 98 %. General appearance: alert and cooperative Resp: clear to auscultation bilaterally Cardio: regular rate and rhythm, S1, S2 normal, no murmur, click, rub or gallop GI: soft, non-tender; bowel sounds normal; no masses,  no organomegaly Incision/Wound: intact. Umbilical port oozing slightly but no active  bleeding.  Disposition: 01-Home or Self Care  Discharge Instructions    (HEART FAILURE PATIENTS) Call MD:  Anytime you have any of the following symptoms: 1) 3 pound weight gain in 24 hours or 5 pounds in 1 week 2) shortness of breath, with or without a dry hacking cough 3) swelling in the hands, feet or stomach 4) if you have to sleep on extra pillows at night in order to breathe.    Complete by:  As directed      Call MD for:  difficulty breathing, headache or visual disturbances    Complete by:  As directed      Call MD for:  extreme fatigue    Complete by:  As directed      Call MD for:  hives    Complete by:  As directed      Call MD for:  persistant dizziness or light-headedness    Complete by:  As directed      Call MD for:  persistant nausea and vomiting    Complete by:  As directed      Call MD for:  redness, tenderness, or signs of infection (pain, swelling, redness, odor or green/yellow discharge around incision site)    Complete by:  As directed      Call MD for:  severe uncontrolled pain    Complete by:  As directed      Call MD for:  temperature >100.4    Complete by:  As directed      Diet -  low sodium heart healthy    Complete by:  As directed      Diet general    Complete by:  As directed      Driving Restrictions    Complete by:  As directed   No driving for 7 days or until off narcotic pain medication     Increase activity slowly    Complete by:  As directed      Remove dressing in 24 hours    Complete by:  As directed      Sexual Activity Restrictions    Complete by:  As directed   No intercourse for 6 weeks            Medication List    TAKE these medications        acetaminophen 500 MG tablet  Commonly known as:  TYLENOL  Take 1,000 mg by mouth 2 (two) times daily.     allopurinol 300 MG tablet  Commonly known as:  ZYLOPRIM  Take 300 mg by mouth daily with breakfast.     cetirizine 10 MG tablet  Commonly known as:  ZYRTEC  Take 10 mg by  mouth daily.     citalopram 10 MG tablet  Commonly known as:  CELEXA  Take 10 mg by mouth daily.     docusate sodium 100 MG capsule  Commonly known as:  COLACE  Take 1 capsule (100 mg total) by mouth 2 (two) times daily.     enoxaparin 40 MG/0.4ML injection  Commonly known as:  LOVENOX  Inject 0.4 mLs (40 mg total) into the skin daily.     Ferrous Sulfate 90 (18 Fe) MG Tabs  Take 1 tablet by mouth daily.     furosemide 20 MG tablet  Commonly known as:  LASIX  Take 20 mg by mouth daily.     hydrOXYzine 25 MG tablet  Commonly known as:  ATARAX/VISTARIL  Take 25 mg by mouth at bedtime.     ibuprofen 600 MG tablet  Commonly known as:  ADVIL,MOTRIN  Take 1 tablet (600 mg total) by mouth every 8 (eight) hours as needed (mild pain).     levothyroxine 125 MCG tablet  Commonly known as:  SYNTHROID, LEVOTHROID  Take 125 mcg by mouth daily before breakfast.     lisinopril 20 MG tablet  Commonly known as:  PRINIVIL,ZESTRIL  Take 20 mg by mouth daily with breakfast.     omeprazole 20 MG tablet  Commonly known as:  PRILOSEC OTC  Take 20 mg by mouth daily.     oxyCODONE-acetaminophen 5-325 MG tablet  Commonly known as:  PERCOCET/ROXICET  Take 1-2 tablets by mouth every 4 (four) hours as needed (moderate to severe pain (when tolerating fluids)).     polyethylene glycol packet  Commonly known as:  MIRALAX / GLYCOLAX  Take 17 g by mouth daily as needed for mild constipation or moderate constipation.     tolterodine 4 MG 24 hr capsule  Commonly known as:  DETROL LA  Take 4 mg by mouth daily.     traMADol 50 MG tablet  Commonly known as:  ULTRAM  Take 1-2 tablets (50-100 mg total) by mouth every 6 (six) hours as needed (mild pain).           Follow-up Information    Follow up with Donaciano Eva, MD On 04/20/2016.   Specialty:  Obstetrics and Gynecology   Why:  at 3:15pm at the [place] information:  [address] [place]  [id number] [phone]       Signed: Donaciano Eva 03/27/2016, 1:16 PM

## 2016-03-27 NOTE — Progress Notes (Signed)
RT placed patient on CPAP HS on auto. 2L O2 bleed in. Patient tolerating well.

## 2016-03-27 NOTE — Progress Notes (Signed)
Discharge instructions discussed with patient and family. Verbalized agreement and understanding.  Prescriptions given to patient

## 2016-03-27 NOTE — Discharge Instructions (Signed)
03/27/2016  Activity: 1. Be up and out of the bed during the day.  Take a nap if needed.  You may walk up steps but be careful and use the hand rail.  Stair climbing will tire you more than you think, you may need to stop part way and rest.   2. No lifting or straining for 6 weeks.  3. Do not drive if you are taking narcotic pain medicine.  4. Shower daily.  Use soap and water on your incision and pat dry; don't rub.  No tub baths until cleared by your surgeon.   5. No sexual activity and nothing in the vagina for 8 weeks.  6. You may experience a small amount of clear drainage from your incisions, which is normal.  If the drainage persists or increases, please call the office.   Diet: 1. Low sodium Heart Healthy Diet is recommended.  2. It is safe to use a laxative, such as Miralax or Colace, if you have difficulty moving your bowels.   Wound Care: 1. Keep clean and dry.  Shower daily.  Reasons to call the Doctor:  Fever - Oral temperature greater than 100.4 degrees Fahrenheit  Foul-smelling vaginal discharge  Difficulty urinating  Nausea and vomiting  Increased pain at the site of the incision that is unrelieved with pain medicine.  Difficulty breathing with or without chest pain  New calf pain especially if only on one side  Sudden, continuing increased vaginal bleeding with or without clots.   Contacts: For questions or concerns you should contact:  Dr. Everitt Amber at [phone]  Joylene John, NP at [phone]  After Hours: call [phone] and have the GYN Oncologist paged/contacted  Abdominal Hysterectomy, Care After These instructions give you information on caring for yourself after your procedure. Your doctor may also give you more specific instructions. Call your doctor if you have any problems or questions after your procedure.  HOME CARE It takes 4-6 weeks to recover from this surgery. Follow all of your doctor's instructions.  9. Only take  medicines as told by your doctor. 10. Take showers for 3-4 weeks. Ask your doctor when it is okay to shower. 11. Do not douche, use tampons, or have sex (intercourse) for at least 6 weeks or as told. 12. Follow your doctor's advice about exercise, lifting objects, driving, and general activities. 13. Get plenty of rest and sleep. 14. Do not lift anything heavier than a gallon of milk (about 10 pounds [4.5 kilograms]) for the first month after surgery. 15. Get back to your normal diet as told by your doctor. 16. Do not drink alcohol until your doctor says it is okay. 17. Take a medicine to help you poop (laxative) as told by your doctor. 18. Eating foods high in fiber may help you poop. Eat a lot of raw fruits and vegetables, whole grains, and beans. 19. Drink enough fluids to keep your pee (urine) clear or pale yellow. 69. Have someone help you at home for 1-2 weeks after your surgery. 27. Keep follow-up doctor visits as told. GET HELP IF:  You have chills or fever.  You have puffiness, redness, or pain in area of the cut (incision).  You have yellowish-white fluid (pus) coming from the cut.  You have a bad smell coming from the cut or bandage.  Your cut pulls apart.  You feel dizzy or light-headed.  You have pain or bleeding when you pee.  You keep having watery poop (diarrhea).  You keep  feeling sick to your stomach (nauseous) or keep throwing up (vomiting).  You have fluid (discharge) coming from your vagina.  You have a rash.  You have a reaction to your medicine.  You need stronger pain medicine. GET HELP RIGHT AWAY IF:   You have a fever and your symptoms suddenly get worse.  You have bad belly (abdominal) pain.  You have chest pain.  You are short of breath.  You pass out (faint).  You have pain, puffiness, or redness of your leg.  You bleed a lot from your vagina and notice clumps of tissue (clots). MAKE SURE YOU:   Understand these  instructions.  Will watch your condition.  Will get help right away if you are not doing well or get worse.   This information is not intended to replace advice given to you by your health care provider. Make sure you discuss any questions you have with your health care provider.   Document Released: 06/23/2008 Document Revised: 09/19/2013 Document Reviewed: 07/07/2013 Elsevier Interactive Patient Education 2016 [employer].  How and Where to Give Subcutaneous Enoxaparin Injections Enoxaparin is an injectable medicine. It is used to help prevent blood clots from developing in your veins. Health care providers often use anticoagulants like enoxaparin to prevent clots following surgery. Enoxaparin is also used in combination with other medicines to treat blood clots and heart attacks. If blood clots are left untreated, they can be life threatening.  Enoxaparin comes in single-use syringes. You inject enoxaparin through a syringe into your belly (abdomen). You should change the injection site each time you give yourself a shot. Continue the enoxaparin injections as directed by your health care provider. Your health care provider will use blood clotting test results to decide when you can safely stop using enoxaparin injections. If your health care provider prescribes any additional medicines, use the medicines exactly as directed. HOW DO I INJECT ENOXAPARIN?  22. Wash your hands with soap and water. 23. Clean the selected injection site as directed by your health care provider. 24. Remove the needle cap by pulling it straight off the syringe. 25. Hold the syringe like a pencil using your writing hand. 26. Use your other hand to pinch and hold an inch of the cleansed skin. 27. Insert the entire needle straight down into the fold of skin. 28. Push the plunger with your thumb until the syringe is empty. 29. Pull the needle straight out of your skin. 30. Enoxaparin injection prefilled syringes and  graduated prefilled syringes are available with a system that shields the needle after injection. After you have completed your injection and removed the needle from your skin, firmly push down on the plunger. The protective sleeve will automatically cover the needle and you will hear a click. The click means the needle is safely covered. 31. Place the syringe in the nearest needle box, also called a sharps container. If you do not have a sharps container, you can use a hard-sided plastic container with a secure lid, such as an empty laundry detergent bottle. WHAT ELSE DO I NEED TO KNOW?  Do not use enoxaparin if:  You have allergies to heparin or pork products.  You have been diagnosed with a condition called thrombocytopenia.  Do not use the syringe or needle more than one time.  Use medicines only as directed by your health care provider.  Changes in medicines, supplements, diet, and illness can affect your anticoagulation therapy. Be sure to inform your health care provider of  any of these changes.  It is important that you tell all of your health care providers and your dentist that you are taking an anticoagulant, especially if you are injured or plan to have any type of procedure.  While on anticoagulants, you will need to have blood tests done routinely as directed by your health care provider.  While using this medicine, avoid physical activities or sports that could result in a fall or cause injury.  Follow up with your laboratory test and health care provider appointments as directed. It is very important to keep your appointments. Not keeping appointments could result in a chronic or permanent injury, pain, or disability.  Before giving your medicine, you should make sure the injection is a clear and colorless or pale yellow solution. If your medicine becomes discolored or if there are particles in the syringe, do not use it and notify your health care provider.  Keep your  medicine safely stored at room temperature. SEEK MEDICAL CARE IF:  You develop any rashes on your skin.  You have large areas of bruising on your skin.  You have any worsening of the condition for which you take Enoxaparin.  You develop a fever. SEEK IMMEDIATE MEDICAL CARE IF:  You develop bleeding problems such as:  Bleeding from the gums or nose that does not stop quickly.  Vomiting blood or coughing up blood.  Blood in your urine.  Blood in your stool, or stool that has a dark, tarry, or coffee grounds appearance.  A cut that does not stop bleeding within 10 minutes. These symptoms may represent a serious problem that is an emergency. Do not wait to see if the symptoms will go away. Get medical help right away. Call your local emergency services (911 in the [place] Do not drive yourself to the hospital.    This information is not intended to replace advice given to you by your health care provider. Make sure you discuss any questions you have with your health care provider.   Document Released: 07/16/2004 Document Revised: 10/05/2014 Document Reviewed: 03/01/2014 Elsevier Interactive Patient Education [employer].

## 2016-04-03 ENCOUNTER — Telehealth: Payer: Self-pay | Admitting: Gynecologic Oncology

## 2016-04-03 ENCOUNTER — Telehealth: Payer: Self-pay

## 2016-04-03 NOTE — Telephone Encounter (Signed)
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  Recommendation is for 6 cycles of chemotherapy with carboplatin and paclitaxel and vaginal brachytherapy. We will facilitate her getting an appointment with medical oncology. We will consider her for vaginal brachytherapy after she has completed chemotherapy.  Donaciano Eva, MD

## 2016-04-03 NOTE — Telephone Encounter (Signed)
Patient called to update Dr Everitt Amber that she has been experiencing some back and knee pain that is not new , but has had two stools that have been "black". Patient states she had one last night and one this morning ,patient denies abdominal pain , some vaginal blood noted twice with wiping herself after using the bathroom, also denies chest pain or SOB . Patient also asking if her pathology results were completed , Melissa Cross, APNP updated , will update Dr Denman George.

## 2016-04-20 ENCOUNTER — Ambulatory Visit: Payer: Medicare Other | Attending: Gynecologic Oncology | Admitting: Gynecologic Oncology

## 2016-04-20 ENCOUNTER — Encounter: Payer: Self-pay | Admitting: Genetic Counselor

## 2016-04-20 ENCOUNTER — Encounter: Payer: Self-pay | Admitting: Gynecologic Oncology

## 2016-04-20 VITALS — BP 111/80 | HR 76 | Temp 98.3°F | Resp 19 | Wt 234.7 lb

## 2016-04-20 DIAGNOSIS — M419 Scoliosis, unspecified: Secondary | ICD-10-CM | POA: Insufficient documentation

## 2016-04-20 DIAGNOSIS — Z96653 Presence of artificial knee joint, bilateral: Secondary | ICD-10-CM | POA: Insufficient documentation

## 2016-04-20 DIAGNOSIS — Z8249 Family history of ischemic heart disease and other diseases of the circulatory system: Secondary | ICD-10-CM | POA: Diagnosis not present

## 2016-04-20 DIAGNOSIS — C541 Malignant neoplasm of endometrium: Secondary | ICD-10-CM | POA: Insufficient documentation

## 2016-04-20 DIAGNOSIS — Z90722 Acquired absence of ovaries, bilateral: Secondary | ICD-10-CM | POA: Insufficient documentation

## 2016-04-20 DIAGNOSIS — Z6841 Body Mass Index (BMI) 40.0 and over, adult: Secondary | ICD-10-CM | POA: Insufficient documentation

## 2016-04-20 DIAGNOSIS — Z888 Allergy status to other drugs, medicaments and biological substances status: Secondary | ICD-10-CM | POA: Insufficient documentation

## 2016-04-20 DIAGNOSIS — E039 Hypothyroidism, unspecified: Secondary | ICD-10-CM | POA: Diagnosis not present

## 2016-04-20 DIAGNOSIS — E785 Hyperlipidemia, unspecified: Secondary | ICD-10-CM | POA: Diagnosis not present

## 2016-04-20 DIAGNOSIS — Z8701 Personal history of pneumonia (recurrent): Secondary | ICD-10-CM | POA: Insufficient documentation

## 2016-04-20 DIAGNOSIS — E119 Type 2 diabetes mellitus without complications: Secondary | ICD-10-CM | POA: Diagnosis not present

## 2016-04-20 DIAGNOSIS — M479 Spondylosis, unspecified: Secondary | ICD-10-CM | POA: Diagnosis not present

## 2016-04-20 DIAGNOSIS — I10 Essential (primary) hypertension: Secondary | ICD-10-CM | POA: Diagnosis not present

## 2016-04-20 DIAGNOSIS — Z8542 Personal history of malignant neoplasm of other parts of uterus: Secondary | ICD-10-CM | POA: Diagnosis not present

## 2016-04-20 DIAGNOSIS — Z90710 Acquired absence of both cervix and uterus: Secondary | ICD-10-CM | POA: Insufficient documentation

## 2016-04-20 DIAGNOSIS — Z9049 Acquired absence of other specified parts of digestive tract: Secondary | ICD-10-CM | POA: Insufficient documentation

## 2016-04-20 DIAGNOSIS — Z823 Family history of stroke: Secondary | ICD-10-CM | POA: Diagnosis not present

## 2016-04-20 DIAGNOSIS — Z7189 Other specified counseling: Secondary | ICD-10-CM | POA: Diagnosis not present

## 2016-04-20 DIAGNOSIS — F329 Major depressive disorder, single episode, unspecified: Secondary | ICD-10-CM | POA: Diagnosis not present

## 2016-04-20 DIAGNOSIS — G4733 Obstructive sleep apnea (adult) (pediatric): Secondary | ICD-10-CM | POA: Insufficient documentation

## 2016-04-20 DIAGNOSIS — N950 Postmenopausal bleeding: Secondary | ICD-10-CM | POA: Insufficient documentation

## 2016-04-20 DIAGNOSIS — Z86718 Personal history of other venous thrombosis and embolism: Secondary | ICD-10-CM | POA: Diagnosis not present

## 2016-04-20 DIAGNOSIS — K219 Gastro-esophageal reflux disease without esophagitis: Secondary | ICD-10-CM | POA: Diagnosis not present

## 2016-04-20 NOTE — Progress Notes (Signed)
Follow-up Note: Gyn-Onc  Consult was initially requested by Dr. Benjie Karvonen for the evaluation of Melinda Kerr 81 y.o. female  CC:  Chief Complaint  Patient presents with  . Endometrial cancer    Post-op follow up visit    Assessment/Plan:  Melinda Kerr  is a 81 y.o.  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   I discussed with Melinda Kerr that she has advanced stage endometrial cancer and this is associated with a high risk for recurrence. I recommend adjuvant chemotherapy with 6 cycles of carboplatin and paclitaxel in accordance with NCCN guidelines. If she does well with this regimen and tolerates therapy, I recommend that she be considered for vaginal brachytherapy to consolidate the vaginal tissues against risk for local recurrence.   The patient expressed understanding in this plan. Her appointments are established with Dr Marko Plume. She is cleared from a postoperative standpoint to be considered for chemotherapy.   HPI: Melinda Kerr is a 81 year old G0 who is seen in consultation of Dr Benjie Karvonen for serous endometrial cancer in the setting of morbid obesity (BMI 44). The patient reports a history of postmenopausal bleeding on 01/28/16. She then saw Dr Benjie Karvonen who performed a TVUS on 02/13/16 which revealed 6.2 x 5.3 x 4cm and a thickened endometrial stripe. There was an echogenic mass measuring 22.8cm felt to represent a dermoid cyst on the left ovary. The right ovary was normal. A D&C was performed on 03/04/16 and showed high grade serous carcinoma.   She has morbid obesity with a BMI of 44kg/m2. She has HTN, and a remote history of a spontaneous DVT treated with 6 months of coumadin. She has chronic back pain and scoliosis. The patient's surgical history is most notable for a laparoscopic  cholecystectomy and an umbilical hernia repair without mesh in 2007.  Interval Hx: Prior to surgery she underwent CT staging with a CT abdo/pelvis and chest. This showed some suspicious/borderline appearing nodes in her pelvic lymph nodes and at the left PA region (1.1cm in largest dimension).   On 03/26/16 she underwent robotic assisted total hysterectomy, BSO and SLN biopsy. Intraoperative findings were significant for a clinically suspicious left obturator SLN. Final pathology confirmed a stage IIIC1 high grade serous carcinoma of the endometrium with a 3.8cm tumor invading 1.5 of 1.8cm of myometrial thickness with LVSI present. The ovaries and cervix were free of tumor, however the left obturator SLN contained a macrometastatic (>25mm) focus of disease.   Postoperatively she has done very very well with no concerns or complaints. She has no vaginal bleeding and no abdominal pain.  Current Meds:  Outpatient Encounter Prescriptions as of 04/20/2016  Medication Sig  . acetaminophen (TYLENOL) 500 MG tablet Take 1,000 mg by mouth 2 (two) times daily.   Marland Kitchen allopurinol (ZYLOPRIM) 300 MG tablet Take 300 mg by mouth daily with breakfast.   . cetirizine (ZYRTEC) 10 MG tablet Take 10 mg by mouth daily.  . citalopram (CELEXA) 10 MG tablet Take 10 mg by mouth daily.  . Ferrous Sulfate 90 (18 Fe) MG TABS Take 1 tablet by mouth daily.  . furosemide (LASIX) 20 MG tablet Take 20 mg by mouth daily.  . hydrOXYzine (ATARAX/VISTARIL) 25 MG tablet Take 25 mg by mouth at bedtime.  Marland Kitchen levothyroxine (SYNTHROID, LEVOTHROID) 125 MCG tablet Take 125 mcg  by mouth daily before breakfast.  . lisinopril (PRINIVIL,ZESTRIL) 20 MG tablet Take 20 mg by mouth daily with breakfast.   . omeprazole (PRILOSEC OTC) 20 MG tablet Take 20 mg by mouth daily.  . polyethylene glycol (MIRALAX / GLYCOLAX) packet Take 17 g by mouth daily as needed for mild constipation or moderate constipation.  Marland Kitchen tolterodine (DETROL LA) 4 MG 24 hr capsule  Take 4 mg by mouth daily.  . traMADol (ULTRAM) 50 MG tablet Take 1-2 tablets (50-100 mg total) by mouth every 6 (six) hours as needed (mild pain).  . [DISCONTINUED] docusate sodium (COLACE) 100 MG capsule Take 1 capsule (100 mg total) by mouth 2 (two) times daily.  . [DISCONTINUED] enoxaparin (LOVENOX) 40 MG/0.4ML injection Inject 0.4 mLs (40 mg total) into the skin daily.  . [DISCONTINUED] ibuprofen (ADVIL,MOTRIN) 600 MG tablet Take 1 tablet (600 mg total) by mouth every 8 (eight) hours as needed (mild pain).  . [DISCONTINUED] oxyCODONE-acetaminophen (PERCOCET/ROXICET) 5-325 MG tablet Take 1-2 tablets by mouth every 4 (four) hours as needed (moderate to severe pain (when tolerating fluids)).   No facility-administered encounter medications on file as of 04/20/2016.     Allergy:  Allergies  Allergen Reactions  . Oxybutynin     Bleeding gums, felt sick    Social Hx:   Social History   Social History  . Marital status: Widowed    Spouse name: N/A  . Number of children: N/A  . Years of education: N/A   Occupational History  . Not on file.   Social History Main Topics  . Smoking status: Never Smoker  . Smokeless tobacco: Never Used  . Alcohol use No  . Drug use: No  . Sexual activity: Not Currently   Other Topics Concern  . Not on file   Social History Narrative  . No narrative on file    Past Surgical Hx:  Past Surgical History:  Procedure Laterality Date  . CHOLECYSTECTOMY  age 56  . DILATATION & CURETTAGE/HYSTEROSCOPY WITH MYOSURE N/A 03/04/2016   Procedure: DILATATION & CURETTAGE/HYSTEROSCOPY ;  Surgeon: Azucena Fallen, MD;  Location: [facility];  Service: Gynecology;  Laterality: N/A;  polyp removal   . HERNIA REPAIR  age 59   umblical  . LYMPH NODE BIOPSY N/A 03/26/2016   Procedure: Sentinel LYMPH NODE BIOPSY;  Surgeon: Everitt Amber, MD;  Location: [facility];  Service: Gynecology;  Laterality: N/A;  . ROBOTIC ASSISTED TOTAL HYSTERECTOMY WITH BILATERAL SALPINGO OOPHERECTOMY  N/A 03/26/2016   Procedure: XI ROBOTIC ASSISTED TOTAL Laproscopic HYSTERECTOMY WITH BILATERAL SALPINGO OOPHORECTOMY;  Surgeon: Everitt Amber, MD;  Location: [facility];  Service: Gynecology;  Laterality: N/A;  . TOTAL KNEE ARTHROPLASTY Left 10/09/2013   Procedure: LEFT TOTAL KNEE ARTHROPLASTY;  Surgeon: [name], MD;  Location: [facility];  Service: Orthopedics;  Laterality: Left;  . TOTAL KNEE ARTHROPLASTY Right 04/09/2014   Procedure: RIGHT TOTAL KNEE ARTHROPLASTY;  Surgeon: [name], MD;  Location: [facility];  Service: Orthopedics;  Laterality: Right;  . TUBAL LIGATION  age 5  . VARICOSE VEIN SURGERY Bilateral age 19  . WISDOM TOOTH EXTRACTION  age 88    Past Medical Hx:  Past Medical History:  Diagnosis Date  . Cancer [facility])    endometrial cancer   . Depression   . Diabetes mellitus without complication (HCC)    borderline , diet controlled   . Esophageal reflux   . Hyperlipidemia    under control  . Hypertension   . Hypothyroidism   .  Morbid obesity with BMI of 40.0-44.9, adult ([facility])   . Nocturia   . OSA on CPAP   . Osteoarthritis   . Phlebitis    hx of  . Pneumonia    hx of   . Prediabetes    under control with diet  . Spondylosis    with scoliosis  . Urinary frequency     Past Gynecological History:  G0  No LMP recorded. Patient is postmenopausal.  Family Hx:  Family History  Problem Relation Age of Onset  . Heart disease Mother   . Heart disease Father   . CVA Father     Review of Systems:  Constitutional  Feels well,    ENT Normal appearing ears and nares bilaterally Skin/Breast  No rash, sores, jaundice, itching, dryness Cardiovascular  No chest pain, shortness of breath, or edema  Pulmonary  No cough or wheeze.  Gastro Intestinal  No nausea, vomitting, or diarrhoea. No bright red blood per rectum, no abdominal pain, change in bowel movement, or constipation.  Genito Urinary  No frequency, urgency, dysuria, no bleeding Musculo Skeletal  No  myalgia, arthralgia, joint swelling or pain  Neurologic  No weakness, numbness, change in gait,  Psychology  No depression, anxiety, insomnia.   Vitals:  Blood pressure 111/80, pulse 76, temperature 98.3 F (36.8 C), temperature source Oral, resp. rate 19, weight 234 lb 11.2 oz (106.5 kg), SpO2 98 %.  Physical Exam: WD in NAD Neck  Supple NROM, without any enlargements.  Lymph Node Survey No cervical supraclavicular or inguinal adenopathy Cardiovascular  Pulse normal rate, regularity and rhythm. S1 and S2 normal.  Lungs  Clear to auscultation bilateraly. + audible wheezing on exertion. Increased work of breathing when positioning on bed. Good air movement.  Skin  No rash/lesions/breakdown  Psychiatry  Alert and oriented to person, place, and time  Abdomen  Normoactive bowel sounds, abdomen soft, non-tender and obese with pannus folds without evidence of hernia. Well healed incision sites. Back No CVA tenderness Genito Urinary  Vulva/vagina: Normal external female genitalia.  No lesions. No discharge or bleeding.  Bladder/urethra:  No lesions or masses, well supported bladder  Surgically absent cervix and uterus with no masses or lesions.  Vaginal cuff in tact and healing normally with suture material present.  No pelvic masses Rectal  deferred Extremities  No bilateral cyanosis, clubbing or edema.   20 minutes of direct face to face counseling time was spent with the patient. This included discussion about prognosis, therapy recommendations and postoperative side effects and are beyond the scope of routine postoperative care.  Donaciano Eva, MD  04/20/2016, 5:23 PM

## 2016-04-20 NOTE — Patient Instructions (Signed)
You are scheduled for chemotherapy education class Wednesday July 26 , 2017 and your consultation with Dr Marko Plume is scheduled for Friday April 23, 2016 . Plan to follow up with Dr Everitt Amber after your chemotherapy is completed.   Thank you !

## 2016-04-20 NOTE — Progress Notes (Signed)
Gynecologic Oncology Multi-Disciplinary Disposition Conference Note  Date of the Conference: April 20, 2016  Patient Name: Melinda Kerr  Referring Provider: Dr. Benjie Karvonen Primary GYN Oncologist: Dr. Everitt Amber  Stage/Disposition:  Stage IIIC serous carcinoma of the uterus.  Disposition is to 6 cycles of chemotherapy with carboplatin and paclitaxel and vaginal brachytherapy.   This Multidisciplinary conference took place involving physicians from [place], [place], Radiation Oncology, Pathology, Radiology along with the Gynecologic Oncology Nurse Practitioner and RN.  Comprehensive assessment of the patient's malignancy, staging, need for surgery, chemotherapy, radiation therapy, and need for further testing were reviewed. Supportive measures, both inpatient and following discharge were also discussed. The recommended plan of care is documented. Greater than 35 minutes were spent correlating and coordinating this patient's care.

## 2016-04-22 ENCOUNTER — Encounter: Payer: Self-pay | Admitting: *Deleted

## 2016-04-22 ENCOUNTER — Other Ambulatory Visit: Payer: Self-pay | Admitting: Oncology

## 2016-04-22 ENCOUNTER — Other Ambulatory Visit: Payer: Medicare Other

## 2016-04-22 DIAGNOSIS — C541 Malignant neoplasm of endometrium: Secondary | ICD-10-CM

## 2016-04-24 ENCOUNTER — Encounter: Payer: Self-pay | Admitting: Oncology

## 2016-04-24 ENCOUNTER — Telehealth: Payer: Self-pay | Admitting: Oncology

## 2016-04-24 ENCOUNTER — Other Ambulatory Visit (HOSPITAL_BASED_OUTPATIENT_CLINIC_OR_DEPARTMENT_OTHER): Payer: Medicare Other

## 2016-04-24 ENCOUNTER — Telehealth: Payer: Self-pay | Admitting: *Deleted

## 2016-04-24 ENCOUNTER — Ambulatory Visit (HOSPITAL_BASED_OUTPATIENT_CLINIC_OR_DEPARTMENT_OTHER): Payer: Medicare Other | Admitting: Oncology

## 2016-04-24 VITALS — BP 103/54 | HR 88 | Temp 97.9°F | Resp 18 | Ht 62.0 in | Wt 233.2 lb

## 2016-04-24 DIAGNOSIS — Z9989 Dependence on other enabling machines and devices: Secondary | ICD-10-CM

## 2016-04-24 DIAGNOSIS — Z86718 Personal history of other venous thrombosis and embolism: Secondary | ICD-10-CM | POA: Diagnosis not present

## 2016-04-24 DIAGNOSIS — G4733 Obstructive sleep apnea (adult) (pediatric): Secondary | ICD-10-CM

## 2016-04-24 DIAGNOSIS — I10 Essential (primary) hypertension: Secondary | ICD-10-CM | POA: Diagnosis not present

## 2016-04-24 DIAGNOSIS — C541 Malignant neoplasm of endometrium: Secondary | ICD-10-CM | POA: Diagnosis not present

## 2016-04-24 DIAGNOSIS — K219 Gastro-esophageal reflux disease without esophagitis: Secondary | ICD-10-CM | POA: Diagnosis not present

## 2016-04-24 DIAGNOSIS — M545 Low back pain: Secondary | ICD-10-CM

## 2016-04-24 DIAGNOSIS — R350 Frequency of micturition: Secondary | ICD-10-CM | POA: Diagnosis not present

## 2016-04-24 DIAGNOSIS — R7303 Prediabetes: Secondary | ICD-10-CM

## 2016-04-24 DIAGNOSIS — Z6841 Body Mass Index (BMI) 40.0 and over, adult: Secondary | ICD-10-CM

## 2016-04-24 DIAGNOSIS — E039 Hypothyroidism, unspecified: Secondary | ICD-10-CM

## 2016-04-24 LAB — CBC WITH DIFFERENTIAL/PLATELET
BASO%: 1.2 % (ref 0.0–2.0)
Basophils Absolute: 0.1 10*3/uL (ref 0.0–0.1)
EOS ABS: 0.3 10*3/uL (ref 0.0–0.5)
EOS%: 5.2 % (ref 0.0–7.0)
HCT: 38.1 % (ref 34.8–46.6)
HGB: 12.4 g/dL (ref 11.6–15.9)
LYMPH%: 19.7 % (ref 14.0–49.7)
MCH: 30 pg (ref 25.1–34.0)
MCHC: 32.7 g/dL (ref 31.5–36.0)
MCV: 91.7 fL (ref 79.5–101.0)
MONO#: 0.7 10*3/uL (ref 0.1–0.9)
MONO%: 12.4 % (ref 0.0–14.0)
NEUT%: 61.5 % (ref 38.4–76.8)
NEUTROS ABS: 3.5 10*3/uL (ref 1.5–6.5)
Platelets: 392 10*3/uL (ref 145–400)
RBC: 4.15 10*6/uL (ref 3.70–5.45)
RDW: 14.3 % (ref 11.2–14.5)
WBC: 5.7 10*3/uL (ref 3.9–10.3)
lymph#: 1.1 10*3/uL (ref 0.9–3.3)

## 2016-04-24 LAB — COMPREHENSIVE METABOLIC PANEL
ALK PHOS: 137 U/L (ref 40–150)
ALT: 17 U/L (ref 0–55)
AST: 22 U/L (ref 5–34)
Albumin: 3.3 g/dL — ABNORMAL LOW (ref 3.5–5.0)
Anion Gap: 9 mEq/L (ref 3–11)
BILIRUBIN TOTAL: 0.56 mg/dL (ref 0.20–1.20)
BUN: 14.5 mg/dL (ref 7.0–26.0)
CO2: 27 mEq/L (ref 22–29)
Calcium: 10.4 mg/dL (ref 8.4–10.4)
Chloride: 102 mEq/L (ref 98–109)
Creatinine: 1 mg/dL (ref 0.6–1.1)
EGFR: 56 mL/min/{1.73_m2} — AB (ref >90)
Glucose: 141 mg/dl — ABNORMAL HIGH (ref 70–140)
POTASSIUM: 4.5 meq/L (ref 3.5–5.1)
SODIUM: 139 meq/L (ref 136–145)
TOTAL PROTEIN: 6.8 g/dL (ref 6.4–8.3)

## 2016-04-24 NOTE — Telephone Encounter (Signed)
appt made and avs printed °

## 2016-04-24 NOTE — Telephone Encounter (Signed)
Scheduled appt per Dr. Marko Plume. She will give patient appts

## 2016-04-24 NOTE — Patient Instructions (Signed)
First chemo will be given on Mon Aug 7, lab at 9:30 and chemo at 1015 The night before first chemo you will take 2 doses of steroid (decadron/ dexamethasone), 4 mg tablets.   You need to take five tablets = 20 mg with food ~ 12 hrs before chemo ( about 10 PM on Sun Aug 6) and five tablets with food ~ 6 hrs before chemo (about 4 AM on Mon Aug 7). If you do not have any allergic problems with the first taxol treatment, we will decrease the steroids to five tablets 12 hrs before the rest of your treatments.   We will send prescriptions to your pharmacy  Decadron (dexamethasone, steroid)  4 mg.   Five tablets with food 12 hrs and 6 hrs before chemo, then as directed.  Zofran (ondansetron)  8 mg   One every 8 hrs as needed for nausea, will not make you drowsy  Ativan (lorazepam) 0.5 mg   Dissolve under tongue or swallow every 6 hrs as needed for nausea. Will make you sleepy and maybe a little forgetful around each dose - do not drive with this.  Claritin may help if taxol aches, 10 mg daily  You can call any time if questions or concerns   [phone]

## 2016-04-24 NOTE — Progress Notes (Signed)
[facility] NEW PATIENT EVALUATION   Name: Melinda Kerr Date: April 24, 2016  MRN: [id number] DOB: 08/02/1935  REFERRING PHYSICIAN: Dorann Ou cc Maury Dus, MD; Manon Hilding, F.Alusio, R.Buccini, Alphonsa Overall  PCP Dr Alyson Ingles sees every 6 months, last 12-2015  REASON FOR REFERRAL: IIIC1 high grade serous endometrial carcinoma   HISTORY OF PRESENT ILLNESS:Melinda Kerr is a 81 y.o. female who is seen in consultation, alone for visit, at the request of Dr Everitt Amber, for consideration of adjuvant chemotherapy for IIIC1 high grade serous endometrial carcinoma.   Patient had one episode of heavier vaginal bleeding on 01-28-16, then two later episodes of spotting. She saw Dr Benjie Karvonen, with [place] and D&C 03-04-16 demonstrating high grade serous endometrial carcinoma ([id number]). She was referred to Dr Denman George, with CT CAP 03-24-16 with 20 mm endometrium, 2 mildly enlarged left external iliac nodes and a mildly enlarged left para aortic node, but without evidence of other distant disease. She had robotic total hysterectomy BSO and sentinel node evaluation by Dr Denman George on 03-26-16, with pathology [phone]) high grade serous carcinoma of endometrium extending into outer half of myometrium, + LVSI, involved left obturator sentinel node with 3 other sentinel nodes negative, ovaries and tubes not involved. Post operative course was not remarkable. She completed 14 days of prophylactic lovenox postoperatively. SHe saw Dr Denman George in follow up on 04-20-16, doing well from surgery, recommendation for 6 cycles of carboplatin taxol and consideration of HDR at completion of chemo if doing well. She attended chemotherapy education class prior to this visit.   REVIEW OF SYSTEMS  Constipation following surgery, resolved with mag citrate, bowels moving regularly since then, does occasionally use miralax. No fever post op, no bleeding, no LE swelling or pain. Voids every 1.5 - 2 hrs day and night as baseline even with detrol, no  dysuria, has not seen urologist. No HA. Reading glasses, early cataracts. Slight decrease in hearing. Occasional sinus allergic symptoms, uses prn flonase. No dental problems known, to see new dentist Domingo Mend on 05-20-16. No SOB or other respiratory symptoms. Mammograms due Dec [facility], no noted changes in breasts. No chest pain or palpitations. GERD controlled with prilosec. No nausea or vomiting. Appetite fine. No peripheral neuropathy. Chronic low back pain such that she can stand or walk only for ~ 15 min, known scoliosis, unchanged. . Peripheral veins easy to access thus far. Remainder of full 10 point review of systems negative.   ALLERGIES: Oxybutynin  PAST MEDICAL/ SURGICAL HISTORY:  G0   Morbid obesity, BMI 44 HTN, elevated lipids: HTN well controlled, managed by Dr Alyson Ingles CAD, atherosclerosis thoracic aorta and abdominal aorta by CT LE DVT 20 years ago without known provocation, treated with 6 months coumadin, none since Scoliosis, marked degenerative changes LS spine by CT Hiatal hernia by CT Laparoscopic cholecystectomy and umbilical hernia repair  Dr Alphonsa Overall 2007 Bilateral knee arthroplasties 2015  Dr Maureen Ralphs Borderline DM OSA on cpap "83 times in one hour on the sleep study" Colonoscopy by Dr Cristina Gong ~ 8 years ago "no polyps" Mammograms [facility] Dec Hypothyroid managed by PCP  CURRENT MEDICATIONS: reviewed as listed now in EMR. Will send prescriptions for decadron, zofran, ativan  PHARMACY: [facility] at [place]:  Originally from [place]. Widowed, lives alone with niece 2 miles away and brother/ nephew in [place]. Neighbors are close friends.  Retired from [employer]. Never smoker, no ETOH.  She has a trip to  [place] 7-31, a church day trip to [place], and a cruise planned to [place] with a friend 10-7 thru 07-11-16   FAMILY HISTORY:   Mother cardiac disease Father cardiac  disease, CVA Sister age 69 dementia Brother in 16s, healthy No other cancer known in family         PHYSICAL EXAM:  height is '5\' 2"'$  (1.575 m) and weight is 233 lb 3.2 oz (105.8 kg). Her oral temperature is 97.9 F (36.6 C). Her blood pressure is 103/54 (abnormal) and her pulse is 88. Her respiration is 18 and oxygen saturation is 95%.  Alert, pleasant, cooperative lady, morbidly obese, looks stated age and appears comfortable. Good historian. Ambulatory slowly with cane.  HEENT:normal hair pattern. PERRL, not icteric. Oral mucosa slightly dry without lesions, no obvious dental problems. Neck supple without thyroid mass or JVD.   RESPIRATORY: lungs clear to A and P  CARDIAC/ VASCULAR: heart RRR, clear heart sounds, no gallop. Peripheral pulses symmetrical and intact Peripheral veins UE look very adequate for chemotherapy.  ABDOMEN: obese, soft, a few BS. Surgical incisions appear to be healing well. Not tender.  LYMPH NODES: no cervical, supraclavicular, axillary or inguinal nodes  BREASTS: bilaterally without dominant mass, skin or nipple findings, axillae benign  NEUROLOGIC: slightly decreased hearing. CN otherwise intact; motor, sensory, cerebellar no focal deficits  SKIN: without rash, ecchymoses, petechiae. Some venous stasis changes lower legs   MUSCULOSKELETAL: back slightly tender LS, no pitting edema, cords, tenderness. Varicose veins LE .    LABORATORY DATA:  Results for orders placed or performed in visit on 04/24/16 (from the past 48 hour(s))  CBC with Differential     Status: None   Collection Time: 04/24/16  8:16 AM  Result Value Ref Range   WBC 5.7 3.9 - 10.3 10e3/uL   NEUT# 3.5 1.5 - 6.5 10e3/uL   HGB 12.4 11.6 - 15.9 g/dL   HCT 38.1 34.8 - 46.6 %   Platelets 392 145 - 400 10e3/uL   MCV 91.7 79.5 - 101.0 fL   MCH 30.0 25.1 - 34.0 pg   MCHC 32.7 31.5 - 36.0 g/dL   RBC 4.15 3.70 - 5.45 10e6/uL   RDW 14.3 11.2 - 14.5 %   lymph# 1.1 0.9 - 3.3 10e3/uL    MONO# 0.7 0.1 - 0.9 10e3/uL   Eosinophils Absolute 0.3 0.0 - 0.5 10e3/uL   Basophils Absolute 0.1 0.0 - 0.1 10e3/uL   NEUT% 61.5 38.4 - 76.8 %   LYMPH% 19.7 14.0 - 49.7 %   MONO% 12.4 0.0 - 14.0 %   EOS% 5.2 0.0 - 7.0 %   BASO% 1.2 0.0 - 2.0 %  Comprehensive metabolic panel     Status: Abnormal   Collection Time: 04/24/16  8:16 AM  Result Value Ref Range   Sodium 139 136 - 145 mEq/L   Potassium 4.5 3.5 - 5.1 mEq/L   Chloride 102 98 - 109 mEq/L   CO2 27 22 - 29 mEq/L   Glucose 141 (H) 70 - 140 mg/dl    Comment: Glucose reference range is for nonfasting patients. Fasting glucose reference range is 70- 100.   BUN 14.5 7.0 - 26.0 mg/dL   Creatinine 1.0 0.6 - 1.1 mg/dL   Total Bilirubin 0.56 0.20 - 1.20 mg/dL   Alkaline Phosphatase 137 40 - 150 U/L   AST 22 5 - 34 U/L   ALT 17 0 - 55 U/L   Total Protein 6.8 6.4 -  8.3 g/dL   Albumin 3.3 (L) 3.5 - 5.0 g/dL   Calcium 10.4 8.4 - 10.4 mg/dL   Anion Gap 9 3 - 11 mEq/L   EGFR 56 (L) >90 ml/min/1.73 m2    Comment: eGFR is calculated using the CKD-EPI Creatinine Equation (2009)      PATHOLOGY: for KEAYRA, GRAHAM ([id number]) Patient: CHIANTE, PEDEN Collected: 03/04/2016 Client: [facility] Accession: [id number] Received: 03/04/2016 Azucena Fallen, MDORT OF SURGICAL PATHOLOGY FINAL DIAGNOSIS Diagnosis Endometrium, curettage HIGH GRADE SEROUS CARCINOMA    SURGICAL PATHOLOGY FINAL for YULIANNA, FOLSE ([id number]) Patient: ELLEY, HARP Collected: 03/26/2016 Client: [facility] Accession: [id number] Received: 03/26/2016 Everitt Amber, MD REPORT OF SURGICAL PATHOLOGY FINAL DIAGNOSIS Diagnosis 1. Lymph node, sentinel, biopsy, right obturator - ONE BENIGN LYMPH NODE (0/1). 2. Lymph node, sentinel, biopsy, left external iliac - ONE BENIGN LYMPH NODE (0/1). 3. Lymph node, sentinel, biopsy, left obturator - METASTATIC CARCINOMA IN ONE LYMPH NODE (1/1). 4. Lymph node, sentinel, biopsy, left common iliac - ONE BENIGN  LYMPH NODE (0/1). 5. Uterus +/- tubes/ovaries, neoplastic, cervix - HIGH GRADE SEROUS CARCINOMA EXTENDING TO THE OUTER HALF OF THE MYOMETRIUM. - LYMPHOVASCULAR INVOLVEMENT BY TUMOR. - MARGINS NOT INVOLVED. - CERVIX, BILATERAL OVARIES AND BILATERAL FALLOPIAN TUBES FREE OF TUMOR. - MATURE CYSTIC TERATOMA OF LEFT OVARY. Microscopic Comment 5. ONCOLOGY TABLE-UTERUS, CARCINOMA OR CARCINOSARCOMA Specimen: Uterus with bilateral obturator and iliac lymph nodes Procedure: Hysterectomy with bilateral salpingo-oophorectomy and lymph node sampling Lymph node sampling performed: Yes Specimen integrity: Intact Maximum tumor size: 3.8 cm Histologic type: Serous carcinoma Grade: High grade Myometrial invasion: 1.5 cm where myometrium is 1.8 cm in thickness Cervical stromal involvement: No Extent of involvement of other organs: N/A Lymph - vascular invasion: Present. Peritoneal washings: No submitted. Lymph nodes: Examined: 4 Sentinel 0 Non-sentinel 4 Total Lymph nodes with metastasis: 1 Isolated tumor cells (< 0.2 mm): 0 Micrometastasis: (> 0.2 mm and < 2.0 mm): 0 Macrometastasis: (> 2.0 mm): 1 Extracapsular extension: No Pelvic lymph nodes: 1 involved of 4 lymph nodes. Para-aortic lymph nodes: N/A involved of N/A lymph nodes. Other (specify involvement and site): N/A TNM code: pT1b, pN1 FIGO Stage (based on pathologic findings, needs clinical correlation): IIIC1 Comment: Immunohistochemistry for cytokeratin AE1/AE3 is performed on parts 1, 2 and 4 and no positivity is identified.    RADIOGRAPHY: EXAM: CT CHEST, ABDOMEN, AND PELVIS WITH CONTRAST  03-24-16  TECHNIQUE: Multidetector CT imaging of the chest, abdomen and pelvis was performed following the standard protocol during bolus administration of intravenous contrast.  CONTRAST:  172m ISOVUE-300 IOPAMIDOL (ISOVUE-300) INJECTION 61%  COMPARISON:  No prior CT of the chest, abdomen or pelvis.  FINDINGS: CT  CHEST  Mediastinum/Nodes: Normal heart size. No significant pericardial fluid/thickening. Left anterior descending, left circumflex and right coronary atherosclerosis. Atherosclerotic nonaneurysmal thoracic aorta. Normal caliber pulmonary arteries. No central pulmonary emboli. No discrete thyroid nodules. Unremarkable esophagus. No pathologically enlarged axillary, mediastinal or hilar lymph nodes.  Lungs/Pleura: No pneumothorax. No pleural effusion. No acute consolidative airspace disease, significant pulmonary nodules or lung masses. There is a mosaic attenuation in both lungs. Scattered parenchymal banding in the medial right upper lobe and left lower lobe is consistent with mild postinfectious/ postinflammatory scarring. There is compressive atelectasis in the medial lower lobes bilaterally from the hiatal hernia.  Musculoskeletal: No aggressive appearing focal osseous lesions. Moderate to marked degenerative changes in the thoracic spine.  CT ABDOMEN AND PELVIS  Hepatobiliary: Normal liver with no liver mass. Cholecystectomy. No biliary ductal dilatation.  Pancreas: Normal, with no mass or duct dilation.  Spleen: Normal size. No mass.  Adrenals/Urinary Tract: Normal adrenals. No hydronephrosis. No renal mass. Normal bladder.  Stomach/Bowel: Large hiatal hernia. Otherwise grossly normal stomach. Normal caliber small bowel with no small bowel wall thickening. Normal appendix . Normal large bowel with no diverticulosis, large bowel wall thickening or pericolonic fat stranding.  Vascular/Lymphatic: Atherosclerotic nonaneurysmal abdominal aorta. Patent portal, splenic, hepatic and renal veins. There are mildly enlarged 1.1 cm and 1.0 cm left external iliac nodes (series 2/ image 92 and series 2/image 98). Mildly enlarged 1.0 cm left para-aortic node (series 2/image 60). No additional pathologically enlarged abdominopelvic lymph nodes.  Reproductive: The uterus  is anteverted. There is abnormal endometrial thickening (20 mm). There is a nonspecific 2.0 x 2.1 x 2.3 cm posterior uterine body mass most suggestive of a subserosal fibroid. There is a fat attenuation 2.7 x 2.2 x 2.1 cm mass adjacent to the uterine fundus (series 2/ image 87), which could represent a left ovarian dermoid or an exophytic lipoleiomyoma. No right adnexal mass.  Other: No pneumoperitoneum, ascites or focal fluid collection. Small fat containing left inguinal hernia.  Musculoskeletal: No aggressive appearing focal osseous lesions. Moderate to marked dextroscoliosis of the lumbar spine with marked degenerative changes in the lumbar spine.  IMPRESSION: 1. Abnormally thickened endometrium (20 mm), consistent with the provided history of endometrial cancer. 2. Two mildly enlarged left external iliac lymph nodes and single mildly enlarged left para-aortic node are indeterminate, nodal metastases not excluded. Otherwise no potential sites of metastatic disease in the chest, abdomen or pelvis. 3. Posterior uterine body 2.3 cm mass, nonspecific, most suggestive of a subserosal fibroid. 4. Fatty attenuation 2.7 cm mass adjacent to the uterine fundus, differential includes a left ovarian dermoid or exophytic lipoleiomyoma. 5. No ascites. 6. Additional findings include coronary atherosclerosis, aortic atherosclerosis, mosaic attenuation in the lungs most commonly due to air trapping, large hiatal hernia and small fat containing left inguinal hernia.    DISCUSSION: All of above history reviewed, including circumstances at presentation, workup including CTs, surgical findings and rationale for adjuvant chemotherapy with carboplatin and taxol. We have discussed outpatient administration of chemotherapy, premedication steroids for taxol (20 mg 12 hrs and 6 hrs prior to first dose then 12 hrs prior for subsequent doses if tolerated), antiemetics. She understands that she will  need a driver to and from chemo. We have discussed options of every 3 week full dose vs dose dense weekly regimen. We have discussed possible side effects of low blood counts, nausea, taxol aches, taxol allergic reactions, hair loss. She understands that all patients lose hair entirely with the full dose regimen, but that the weekly regimen sometimes does not cause complete hair loss; she is particularly concerned about hair loss. I have recommended the Look Good Feel Better program. I have mentioned that the weekly regimen also can be easier to tolerate from standpoint of taxol aches. At conclusion of discussion patient would like to begin treatment with weekly dose dense regimen.    Written and oral instructions as follows:  First chemo will be given on Mon Aug 7, lab at 9:30 and chemo at 1015 The night before first chemo you will take 2 doses of steroid (decadron/ dexamethasone), 4 mg tablets.   You need to take five tablets = 20 mg with food ~ 12 hrs before chemo ( about 10 PM on Sun Aug 6) and five tablets with food ~ 6 hrs before chemo (about 4 AM  on Mon Aug 7). If you do not have any allergic problems with the first taxol treatment, we will decrease the steroids to five tablets 12 hrs before the rest of your treatments.   We will send prescriptions to your pharmacy Decadron (dexamethasone, steroid)  4 mg.   Five tablets with food 12 hrs and 6 hrs before chemo, then as directed. Zofran (ondansetron)  8 mg   One every 8 hrs as needed for nausea, will not make you drowsy Ativan (lorazepam) 0.5 mg   Dissolve under tongue or swallow every 6 hrs as needed for nausea. Will make you sleepy and maybe a little forgetful around each dose - do not drive with this. Claritin may help if taxol aches, 10 mg daily    Verbal consent given for chemotherapy. Plan day 1 cycle 1 on 05-04-16.   IMPRESSION / PLAN:  1. IIIC1 high grade serous endometrial carcinoma: post robotic assisted total hysterectomy BSO and  sentinel node evaluation by Dr Denman George 03-26-16.  Plan 6 cycles of carboplatin taxol as adjuvant therapy, dose dense weekly regimen to start 05-04-16. 2.morbid obesity likely contributing to the endometrial carcinoma 3.history LE DVT 20 years ago. Completed prophylactic lovenox postoperatively, no findings of concern on exam now.  4.borderline DM, nonfasting blood sugar 140 today. Expect transient elevation of blood sugars with steroids for taxol, will add SS insulin into chemo orders.  5.degenerative disease and scoliosis LS with chronic back pain. Known to Dr Maureen Ralphs with knee replacements 6.obstructive sleep apnea on CPAP 7.urinary frequency with related sleep disturbance, long-standing. UA preop noted after visit, no culture then, will repeat at least when she is at office next.  8. Post lap chole and umbilical hernia repair. HH. GERD controlled on prilosce 9.HTN controlled. Elevated lipids. CAD by CT. Atherosclerosis thoracic and abdominal aorta on CT 10.hypothyroid on synthroid by Dr Alyson Ingles 11.up to date colonoscopy, no polyps per patient  Patient had questions answered to her satisfaction and is in agreement with plan above. She can contact this office for questions or concerns at any time prior to next scheduled visit. Chemo orders placed including SS insulin. Granix requested. Managed care notified.  Will repeat CBC CMET on 05-04-16 as > 1 week from today's labs, with UA C&S if not done prior.  I will see her prior to treatment day 8 cycle 1. Message to collaborative RN to confirm steroids and antiemetics with patient prior to first treatment.  Route PCP, cc Drs Denman George and Mody Time spent 55 min , including >50% discussion and coordination of care.    Evlyn Clines, MD 04/24/2016 6:51 PM

## 2016-05-01 ENCOUNTER — Telehealth: Payer: Self-pay

## 2016-05-01 DIAGNOSIS — C541 Malignant neoplasm of endometrium: Secondary | ICD-10-CM

## 2016-05-01 MED ORDER — ONDANSETRON HCL 8 MG PO TABS: 8.0000 mg | ORAL_TABLET | Freq: Three times a day (TID) | ORAL | 1 refills | Status: DC | PRN

## 2016-05-01 MED ORDER — DEXAMETHASONE 4 MG PO TABS: ORAL_TABLET | ORAL | 0 refills | Status: DC

## 2016-05-01 MED ORDER — LORAZEPAM 0.5 MG PO TABS: ORAL_TABLET | ORAL | 0 refills | Status: DC

## 2016-05-01 NOTE — Telephone Encounter (Signed)
Patient Instructions Encounter Date: 04/24/2016 Gordy Levan, MD  Oncology    First chemo will be given on Mon Aug 7, lab at 9:30 and chemo at 1015 The night before first chemo you will take 2 doses of steroid (decadron/ dexamethasone), 4 mg tablets.   You need to take five tablets = 20 mg with food ~ 12 hrs before chemo ( about 10 PM on Sun Aug 6) and five tablets with food ~ 6 hrs before chemo (about 4 AM on Mon Aug 7). If you do not have any allergic problems with the first taxol treatment, we will decrease the steroids to five tablets 12 hrs before the rest of your treatments.   We will send prescriptions to your pharmacy  Decadron (dexamethasone, steroid)  4 mg.   Five tablets with food 12 hrs and 6 hrs before chemo, then as directed.  Zofran (ondansetron)  8 mg   One every 8 hrs as needed for nausea, will not make you drowsy  Ativan (lorazepam) 0.5 mg   Dissolve under tongue or swallow every 6 hrs as needed for nausea. Will make you sleepy and maybe a little forgetful around each dose - do not drive with this.  Claritin may help if taxol aches, 10 mg daily  You can call any time if questions or concerns   [phone]       Office Visit on 04/24/2016        Detailed Report       Note shared with patient

## 2016-05-01 NOTE — Telephone Encounter (Signed)
Sent in medications to pharmacy for patient.  Reviewed directions as noted below by Dr. Marko Plume. Ms Papka verbalized understanding.

## 2016-05-04 ENCOUNTER — Other Ambulatory Visit (HOSPITAL_BASED_OUTPATIENT_CLINIC_OR_DEPARTMENT_OTHER): Payer: Medicare Other

## 2016-05-04 ENCOUNTER — Ambulatory Visit (HOSPITAL_BASED_OUTPATIENT_CLINIC_OR_DEPARTMENT_OTHER): Payer: Medicare Other

## 2016-05-04 ENCOUNTER — Other Ambulatory Visit: Payer: Self-pay | Admitting: Oncology

## 2016-05-04 ENCOUNTER — Ambulatory Visit: Payer: Medicare Other

## 2016-05-04 VITALS — BP 102/61 | HR 86 | Temp 98.4°F | Resp 16

## 2016-05-04 DIAGNOSIS — Z5111 Encounter for antineoplastic chemotherapy: Secondary | ICD-10-CM | POA: Diagnosis present

## 2016-05-04 DIAGNOSIS — C541 Malignant neoplasm of endometrium: Secondary | ICD-10-CM | POA: Diagnosis present

## 2016-05-04 DIAGNOSIS — R350 Frequency of micturition: Secondary | ICD-10-CM | POA: Diagnosis not present

## 2016-05-04 DIAGNOSIS — R7303 Prediabetes: Secondary | ICD-10-CM

## 2016-05-04 LAB — URINALYSIS, MICROSCOPIC - CHCC
Bilirubin (Urine): NEGATIVE
Glucose: NEGATIVE mg/dL
Ketones: NEGATIVE mg/dL
Nitrite: NEGATIVE
Protein: NEGATIVE mg/dL
SPECIFIC GRAVITY, URINE: 1.02 (ref 1.003–1.035)
UROBILINOGEN UR: 0.2 mg/dL (ref 0.2–1)
pH: 6 (ref 4.6–8.0)

## 2016-05-04 LAB — COMPREHENSIVE METABOLIC PANEL
ALT: 22 U/L (ref 0–55)
AST: 23 U/L (ref 5–34)
Albumin: 3.5 g/dL (ref 3.5–5.0)
Alkaline Phosphatase: 126 U/L (ref 40–150)
Anion Gap: 11 mEq/L (ref 3–11)
BILIRUBIN TOTAL: 0.5 mg/dL (ref 0.20–1.20)
BUN: 31 mg/dL — ABNORMAL HIGH (ref 7.0–26.0)
CHLORIDE: 103 meq/L (ref 98–109)
CO2: 22 meq/L (ref 22–29)
CREATININE: 1.2 mg/dL — AB (ref 0.6–1.1)
Calcium: 10.5 mg/dL — ABNORMAL HIGH (ref 8.4–10.4)
EGFR: 45 mL/min/{1.73_m2} — AB (ref >90)
GLUCOSE: 287 mg/dL — AB (ref 70–140)
Potassium: 4.7 mEq/L (ref 3.5–5.1)
Sodium: 137 mEq/L (ref 136–145)
TOTAL PROTEIN: 7 g/dL (ref 6.4–8.3)

## 2016-05-04 LAB — CBC WITH DIFFERENTIAL/PLATELET
BASO%: 0.3 % (ref 0.0–2.0)
BASOS ABS: 0 10*3/uL (ref 0.0–0.1)
EOS%: 0 % (ref 0.0–7.0)
Eosinophils Absolute: 0 10*3/uL (ref 0.0–0.5)
HEMATOCRIT: 41.3 % (ref 34.8–46.6)
HEMOGLOBIN: 13.2 g/dL (ref 11.6–15.9)
LYMPH#: 0.7 10*3/uL — AB (ref 0.9–3.3)
LYMPH%: 9.5 % — ABNORMAL LOW (ref 14.0–49.7)
MCH: 29.4 pg (ref 25.1–34.0)
MCHC: 32.1 g/dL (ref 31.5–36.0)
MCV: 91.6 fL (ref 79.5–101.0)
MONO#: 0 10*3/uL — AB (ref 0.1–0.9)
MONO%: 0.4 % (ref 0.0–14.0)
NEUT#: 6.9 10*3/uL — ABNORMAL HIGH (ref 1.5–6.5)
NEUT%: 89.8 % — ABNORMAL HIGH (ref 38.4–76.8)
PLATELETS: 327 10*3/uL (ref 145–400)
RBC: 4.51 10*6/uL (ref 3.70–5.45)
RDW: 14.4 % (ref 11.2–14.5)
WBC: 7.7 10*3/uL (ref 3.9–10.3)

## 2016-05-04 LAB — WHOLE BLOOD GLUCOSE: GLUCOSE: 290 mg/dL — AB (ref 70–100)

## 2016-05-04 MED ORDER — SODIUM CHLORIDE 0.9 % IV SOLN
Freq: Once | INTRAVENOUS | Status: AC
  Administered 2016-05-04: 11:00:00 via INTRAVENOUS
  Filled 2016-05-04: qty 4

## 2016-05-04 MED ORDER — FAMOTIDINE IN NACL 20-0.9 MG/50ML-% IV SOLN
INTRAVENOUS | Status: AC
  Filled 2016-05-04: qty 50

## 2016-05-04 MED ORDER — SODIUM CHLORIDE 0.9 % IV SOLN
80.0000 mg/m2 | Freq: Once | INTRAVENOUS | Status: AC
  Administered 2016-05-04: 174 mg via INTRAVENOUS
  Filled 2016-05-04: qty 29

## 2016-05-04 MED ORDER — INSULIN REGULAR HUMAN 100 UNIT/ML IJ SOLN
2.0000 [IU] | Freq: Once | INTRAMUSCULAR | Status: AC | PRN
  Administered 2016-05-04: 6 [IU] via SUBCUTANEOUS
  Filled 2016-05-04: qty 0.08

## 2016-05-04 MED ORDER — DIPHENHYDRAMINE HCL 50 MG/ML IJ SOLN
INTRAMUSCULAR | Status: AC
  Filled 2016-05-04: qty 1

## 2016-05-04 MED ORDER — DIPHENHYDRAMINE HCL 50 MG/ML IJ SOLN
25.0000 mg | Freq: Once | INTRAMUSCULAR | Status: AC
  Administered 2016-05-04: 25 mg via INTRAVENOUS

## 2016-05-04 MED ORDER — SODIUM CHLORIDE 0.9 % IV SOLN
442.5000 mg | Freq: Once | INTRAVENOUS | Status: AC
  Administered 2016-05-04: 440 mg via INTRAVENOUS
  Filled 2016-05-04: qty 44

## 2016-05-04 MED ORDER — SODIUM CHLORIDE 0.9 % IV SOLN
Freq: Once | INTRAVENOUS | Status: AC
  Administered 2016-05-04: 12:00:00 via INTRAVENOUS
  Filled 2016-05-04: qty 5

## 2016-05-04 MED ORDER — SODIUM CHLORIDE 0.9 % IV SOLN
Freq: Once | INTRAVENOUS | Status: AC
  Administered 2016-05-04: 11:00:00 via INTRAVENOUS

## 2016-05-04 MED ORDER — FAMOTIDINE IN NACL 20-0.9 MG/50ML-% IV SOLN
20.0000 mg | Freq: Once | INTRAVENOUS | Status: AC
  Administered 2016-05-04: 20 mg via INTRAVENOUS

## 2016-05-04 NOTE — Patient Instructions (Signed)
[facility] Discharge Instructions for Patients Receiving Chemotherapy  Today you received the following chemotherapy agents Taxol/ Carboplatin  To help prevent nausea and vomiting after your treatment, we encourage you to take your nausea medication    If you develop nausea and vomiting that is not controlled by your nausea medication, call the clinic.   BELOW ARE SYMPTOMS THAT SHOULD BE REPORTED IMMEDIATELY:  *FEVER GREATER THAN 100.5 F  *CHILLS WITH OR WITHOUT FEVER  NAUSEA AND VOMITING THAT IS NOT CONTROLLED WITH YOUR NAUSEA MEDICATION  *UNUSUAL SHORTNESS OF BREATH  *UNUSUAL BRUISING OR BLEEDING  TENDERNESS IN MOUTH AND THROAT WITH OR WITHOUT PRESENCE OF ULCERS  *URINARY PROBLEMS  *BOWEL PROBLEMS  UNUSUAL RASH Items with * indicate a potential emergency and should be followed up as soon as possible.  Feel free to call the clinic you have any questions or concerns. The clinic phone number is [phone].  Please show the [employer] at check-in to the Emergency Department and triage nurse.   Paclitaxel injection What is this medicine? PACLITAXEL (PAK li TAX el) is a chemotherapy drug. It targets fast dividing cells, like cancer cells, and causes these cells to die. This medicine is used to treat ovarian cancer, breast cancer, and other cancers. This medicine may be used for other purposes; ask your health care provider or pharmacist if you have questions. What should I tell my health care provider before I take this medicine? They need to know if you have any of these conditions: -blood disorders -irregular heartbeat -infection (especially a virus infection such as chickenpox, cold sores, or herpes) -liver disease -previous or ongoing radiation therapy -an unusual or allergic reaction to paclitaxel, alcohol, polyoxyethylated castor oil, other chemotherapy agents, other medicines, foods, dyes, or preservatives -pregnant or trying to get  pregnant -breast-feeding How should I use this medicine? This drug is given as an infusion into a vein. It is administered in a hospital or clinic by a specially trained health care professional. Talk to your pediatrician regarding the use of this medicine in children. Special care may be needed. Overdosage: If you think you have taken too much of this medicine contact a poison control center or emergency room at once. NOTE: This medicine is only for you. Do not share this medicine with others. What if I miss a dose? It is important not to miss your dose. Call your doctor or health care professional if you are unable to keep an appointment. What may interact with this medicine? Do not take this medicine with any of the following medications: -disulfiram -metronidazole This medicine may also interact with the following medications: -cyclosporine -diazepam -ketoconazole -medicines to increase blood counts like filgrastim, pegfilgrastim, sargramostim -other chemotherapy drugs like cisplatin, doxorubicin, epirubicin, etoposide, teniposide, vincristine -quinidine -testosterone -vaccines -verapamil Talk to your doctor or health care professional before taking any of these medicines: -acetaminophen -aspirin -ibuprofen -ketoprofen -naproxen This list may not describe all possible interactions. Give your health care provider a list of all the medicines, herbs, non-prescription drugs, or dietary supplements you use. Also tell them if you smoke, drink alcohol, or use illegal drugs. Some items may interact with your medicine. What should I watch for while using this medicine? Your condition will be monitored carefully while you are receiving this medicine. You will need important blood work done while you are taking this medicine. This drug may make you feel generally unwell. This is not uncommon, as chemotherapy can affect healthy cells as well as  cancer cells. Report any side effects. Continue  your course of treatment even though you feel ill unless your doctor tells you to stop. This medicine can cause serious allergic reactions. To reduce your risk you will need to take other medicine(s) before treatment with this medicine. In some cases, you may be given additional medicines to help with side effects. Follow all directions for their use. Call your doctor or health care professional for advice if you get a fever, chills or sore throat, or other symptoms of a cold or flu. Do not treat yourself. This drug decreases your body's ability to fight infections. Try to avoid being around people who are sick. This medicine may increase your risk to bruise or bleed. Call your doctor or health care professional if you notice any unusual bleeding. Be careful brushing and flossing your teeth or using a toothpick because you may get an infection or bleed more easily. If you have any dental work done, tell your dentist you are receiving this medicine. Avoid taking products that contain aspirin, acetaminophen, ibuprofen, naproxen, or ketoprofen unless instructed by your doctor. These medicines may hide a fever. Do not become pregnant while taking this medicine. Women should inform their doctor if they wish to become pregnant or think they might be pregnant. There is a potential for serious side effects to an unborn child. Talk to your health care professional or pharmacist for more information. Do not breast-feed an infant while taking this medicine. Men are advised not to father a child while receiving this medicine. This product may contain alcohol. Ask your pharmacist or healthcare provider if this medicine contains alcohol. Be sure to tell all healthcare providers you are taking this medicine. Certain medicines, like metronidazole and disulfiram, can cause an unpleasant reaction when taken with alcohol. The reaction includes flushing, headache, nausea, vomiting, sweating, and increased thirst. The reaction  can last from 30 minutes to several hours. What side effects may I notice from receiving this medicine? Side effects that you should report to your doctor or health care professional as soon as possible: -allergic reactions like skin rash, itching or hives, swelling of the face, lips, or tongue -low blood counts - This drug may decrease the number of white blood cells, red blood cells and platelets. You may be at increased risk for infections and bleeding. -signs of infection - fever or chills, cough, sore throat, pain or difficulty passing urine -signs of decreased platelets or bleeding - bruising, pinpoint red spots on the skin, black, tarry stools, nosebleeds -signs of decreased red blood cells - unusually weak or tired, fainting spells, lightheadedness -breathing problems -chest pain -high or low blood pressure -mouth sores -nausea and vomiting -pain, swelling, redness or irritation at the injection site -pain, tingling, numbness in the hands or feet -slow or irregular heartbeat -swelling of the ankle, feet, hands Side effects that usually do not require medical attention (report to your doctor or health care professional if they continue or are bothersome): -bone pain -complete hair loss including hair on your head, underarms, pubic hair, eyebrows, and eyelashes -changes in the color of fingernails -diarrhea -loosening of the fingernails -loss of appetite -muscle or joint pain -red flush to skin -sweating This list may not describe all possible side effects. Call your doctor for medical advice about side effects. You may report side effects to FDA at 1-800-FDA-1088. Where should I keep my medicine? This drug is given in a hospital or clinic and will not be stored at home.  NOTE: This sheet is a summary. It may not cover all possible information. If you have questions about this medicine, talk to your doctor, pharmacist, or health care provider.    2016, Elsevier/Gold Standard.  (2015-05-02 13:02:56)  Carboplatin injection What is this medicine? CARBOPLATIN (KAR boe pla tin) is a chemotherapy drug. It targets fast dividing cells, like cancer cells, and causes these cells to die. This medicine is used to treat ovarian cancer and many other cancers. This medicine may be used for other purposes; ask your health care provider or pharmacist if you have questions. What should I tell my health care provider before I take this medicine? They need to know if you have any of these conditions: -blood disorders -hearing problems -kidney disease -recent or ongoing radiation therapy -an unusual or allergic reaction to carboplatin, cisplatin, other chemotherapy, other medicines, foods, dyes, or preservatives -pregnant or trying to get pregnant -breast-feeding How should I use this medicine? This drug is usually given as an infusion into a vein. It is administered in a hospital or clinic by a specially trained health care professional. Talk to your pediatrician regarding the use of this medicine in children. Special care may be needed. Overdosage: If you think you have taken too much of this medicine contact a poison control center or emergency room at once. NOTE: This medicine is only for you. Do not share this medicine with others. What if I miss a dose? It is important not to miss a dose. Call your doctor or health care professional if you are unable to keep an appointment. What may interact with this medicine? -medicines for seizures -medicines to increase blood counts like filgrastim, pegfilgrastim, sargramostim -some antibiotics like amikacin, gentamicin, neomycin, streptomycin, tobramycin -vaccines Talk to your doctor or health care professional before taking any of these medicines: -acetaminophen -aspirin -ibuprofen -ketoprofen -naproxen This list may not describe all possible interactions. Give your health care provider a list of all the medicines, herbs,  non-prescription drugs, or dietary supplements you use. Also tell them if you smoke, drink alcohol, or use illegal drugs. Some items may interact with your medicine. What should I watch for while using this medicine? Your condition will be monitored carefully while you are receiving this medicine. You will need important blood work done while you are taking this medicine. This drug may make you feel generally unwell. This is not uncommon, as chemotherapy can affect healthy cells as well as cancer cells. Report any side effects. Continue your course of treatment even though you feel ill unless your doctor tells you to stop. In some cases, you may be given additional medicines to help with side effects. Follow all directions for their use. Call your doctor or health care professional for advice if you get a fever, chills or sore throat, or other symptoms of a cold or flu. Do not treat yourself. This drug decreases your body's ability to fight infections. Try to avoid being around people who are sick. This medicine may increase your risk to bruise or bleed. Call your doctor or health care professional if you notice any unusual bleeding. Be careful brushing and flossing your teeth or using a toothpick because you may get an infection or bleed more easily. If you have any dental work done, tell your dentist you are receiving this medicine. Avoid taking products that contain aspirin, acetaminophen, ibuprofen, naproxen, or ketoprofen unless instructed by your doctor. These medicines may hide a fever. Do not become pregnant while taking this medicine. Women  should inform their doctor if they wish to become pregnant or think they might be pregnant. There is a potential for serious side effects to an unborn child. Talk to your health care professional or pharmacist for more information. Do not breast-feed an infant while taking this medicine. What side effects may I notice from receiving this medicine? Side effects  that you should report to your doctor or health care professional as soon as possible: -allergic reactions like skin rash, itching or hives, swelling of the face, lips, or tongue -signs of infection - fever or chills, cough, sore throat, pain or difficulty passing urine -signs of decreased platelets or bleeding - bruising, pinpoint red spots on the skin, black, tarry stools, nosebleeds -signs of decreased red blood cells - unusually weak or tired, fainting spells, lightheadedness -breathing problems -changes in hearing -changes in vision -chest pain -high blood pressure -low blood counts - This drug may decrease the number of white blood cells, red blood cells and platelets. You may be at increased risk for infections and bleeding. -nausea and vomiting -pain, swelling, redness or irritation at the injection site -pain, tingling, numbness in the hands or feet -problems with balance, talking, walking -trouble passing urine or change in the amount of urine Side effects that usually do not require medical attention (report to your doctor or health care professional if they continue or are bothersome): -hair loss -loss of appetite -metallic taste in the mouth or changes in taste This list may not describe all possible side effects. Call your doctor for medical advice about side effects. You may report side effects to FDA at 1-800-FDA-1088. Where should I keep my medicine? This drug is given in a hospital or clinic and will not be stored at home. NOTE: This sheet is a summary. It may not cover all possible information. If you have questions about this medicine, talk to your doctor, pharmacist, or health care provider.    2016, Elsevier/Gold Standard. (2007-12-20 14:38:05)

## 2016-05-05 ENCOUNTER — Telehealth: Payer: Self-pay

## 2016-05-05 DIAGNOSIS — C541 Malignant neoplasm of endometrium: Secondary | ICD-10-CM

## 2016-05-05 LAB — URINE CULTURE

## 2016-05-05 MED ORDER — DEXAMETHASONE 4 MG PO TABS: ORAL_TABLET | ORAL | 0 refills | Status: DC

## 2016-05-05 NOTE — Telephone Encounter (Signed)
Melinda Kerr is doing well after her chemotherapy yesterday.  No N/V.   Eating and drinking well. No BM today.  She will take miralax today so she does not get constipated. She fell asleep last evening after taking Ativan 0.5 mg tp prevent nausea. and forgot to put on a depend. She had a little pool to clean up this morning.  Told her the results of the UA as noted below by Dr Marko Plume. Told her that since she did well with Taxol, that the Decadron dose for future treatments will be 5 tabs just 12 hours prior to chemotherapy.  Pt. correctly repeated instructions. Sent in 25 tabs of decadron to patient's pharmacy.

## 2016-05-05 NOTE — Telephone Encounter (Signed)
-----   Message from Gordy Levan, MD sent at 05/05/2016  8:18 AM EDT ----- Labs seen and need follow up : not clearly infection on UA but still a little suspicious,  f/u cx until final

## 2016-05-06 ENCOUNTER — Telehealth: Payer: Self-pay

## 2016-05-06 NOTE — Telephone Encounter (Signed)
LM for Ms Melinda Kerr in her voice mail with the results of the urine culture as noted below by Dr. Marko Plume.  She can call back to Dr. Mariana Kaufman office if she has any questions or concerns.

## 2016-05-06 NOTE — Telephone Encounter (Signed)
-----   Message from Gordy Levan, MD sent at 05/06/2016  7:25 AM EDT ----- Labs seen and need follow up  Please let her know no infection

## 2016-05-07 ENCOUNTER — Telehealth: Payer: Self-pay

## 2016-05-07 NOTE — Telephone Encounter (Signed)
Pt called with c/o constipation. Not gone since last Saturday. She did use 2 capfuls of miralax last night and night before. She is feeling "miserable". S/w Dr Marko Plume and recommended colace bid and miralax bid as maintenance for BM. Adjust doses of each as needed. Also to use mag citrate 1/2 bottle and if no results in 8 hours the other 1/2 bottle for immediate problem.

## 2016-05-10 ENCOUNTER — Other Ambulatory Visit: Payer: Self-pay | Admitting: Oncology

## 2016-05-11 ENCOUNTER — Encounter: Payer: Self-pay | Admitting: Oncology

## 2016-05-11 ENCOUNTER — Ambulatory Visit (HOSPITAL_BASED_OUTPATIENT_CLINIC_OR_DEPARTMENT_OTHER): Payer: Medicare Other

## 2016-05-11 ENCOUNTER — Ambulatory Visit (HOSPITAL_BASED_OUTPATIENT_CLINIC_OR_DEPARTMENT_OTHER): Payer: Medicare Other | Admitting: Oncology

## 2016-05-11 ENCOUNTER — Telehealth: Payer: Self-pay | Admitting: Oncology

## 2016-05-11 ENCOUNTER — Other Ambulatory Visit (HOSPITAL_BASED_OUTPATIENT_CLINIC_OR_DEPARTMENT_OTHER): Payer: Medicare Other

## 2016-05-11 VITALS — BP 136/71 | HR 98 | Temp 98.0°F | Resp 18 | Ht 62.0 in | Wt 232.4 lb

## 2016-05-11 DIAGNOSIS — C541 Malignant neoplasm of endometrium: Secondary | ICD-10-CM

## 2016-05-11 DIAGNOSIS — R7303 Prediabetes: Secondary | ICD-10-CM

## 2016-05-11 DIAGNOSIS — Z5111 Encounter for antineoplastic chemotherapy: Secondary | ICD-10-CM | POA: Diagnosis present

## 2016-05-11 DIAGNOSIS — Z86718 Personal history of other venous thrombosis and embolism: Secondary | ICD-10-CM

## 2016-05-11 DIAGNOSIS — I878 Other specified disorders of veins: Secondary | ICD-10-CM

## 2016-05-11 DIAGNOSIS — I10 Essential (primary) hypertension: Secondary | ICD-10-CM | POA: Diagnosis not present

## 2016-05-11 DIAGNOSIS — K219 Gastro-esophageal reflux disease without esophagitis: Secondary | ICD-10-CM

## 2016-05-11 DIAGNOSIS — Z6841 Body Mass Index (BMI) 40.0 and over, adult: Secondary | ICD-10-CM

## 2016-05-11 LAB — CBC WITH DIFFERENTIAL/PLATELET
BASO%: 0.1 % (ref 0.0–2.0)
BASOS ABS: 0 10*3/uL (ref 0.0–0.1)
EOS%: 0 % (ref 0.0–7.0)
Eosinophils Absolute: 0 10*3/uL (ref 0.0–0.5)
HEMATOCRIT: 38 % (ref 34.8–46.6)
HGB: 12.3 g/dL (ref 11.6–15.9)
LYMPH%: 7.7 % — AB (ref 14.0–49.7)
MCH: 29.4 pg (ref 25.1–34.0)
MCHC: 32.3 g/dL (ref 31.5–36.0)
MCV: 91 fL (ref 79.5–101.0)
MONO#: 0 10*3/uL — ABNORMAL LOW (ref 0.1–0.9)
MONO%: 0.4 % (ref 0.0–14.0)
NEUT#: 3.6 10*3/uL (ref 1.5–6.5)
NEUT%: 91.8 % — AB (ref 38.4–76.8)
PLATELETS: 251 10*3/uL (ref 145–400)
RBC: 4.18 10*6/uL (ref 3.70–5.45)
RDW: 14.2 % (ref 11.2–14.5)
WBC: 3.9 10*3/uL (ref 3.9–10.3)
lymph#: 0.3 10*3/uL — ABNORMAL LOW (ref 0.9–3.3)

## 2016-05-11 LAB — COMPREHENSIVE METABOLIC PANEL
ALT: 23 U/L (ref 0–55)
ANION GAP: 8 meq/L (ref 3–11)
AST: 21 U/L (ref 5–34)
Albumin: 3.2 g/dL — ABNORMAL LOW (ref 3.5–5.0)
Alkaline Phosphatase: 99 U/L (ref 40–150)
BUN: 24.8 mg/dL (ref 7.0–26.0)
CALCIUM: 10.2 mg/dL (ref 8.4–10.4)
CHLORIDE: 103 meq/L (ref 98–109)
CO2: 24 meq/L (ref 22–29)
CREATININE: 0.9 mg/dL (ref 0.6–1.1)
EGFR: 60 mL/min/{1.73_m2} — ABNORMAL LOW (ref >90)
Glucose: 204 mg/dl — ABNORMAL HIGH (ref 70–140)
POTASSIUM: 5 meq/L (ref 3.5–5.1)
Sodium: 135 mEq/L — ABNORMAL LOW (ref 136–145)
Total Bilirubin: 0.73 mg/dL (ref 0.20–1.20)
Total Protein: 6.4 g/dL (ref 6.4–8.3)

## 2016-05-11 MED ORDER — FAMOTIDINE IN NACL 20-0.9 MG/50ML-% IV SOLN
INTRAVENOUS | Status: AC
  Filled 2016-05-11: qty 50

## 2016-05-11 MED ORDER — SODIUM CHLORIDE 0.9 % IV SOLN
Freq: Once | INTRAVENOUS | Status: AC
  Administered 2016-05-11: 14:00:00 via INTRAVENOUS
  Filled 2016-05-11: qty 4

## 2016-05-11 MED ORDER — DIPHENHYDRAMINE HCL 50 MG/ML IJ SOLN
25.0000 mg | Freq: Once | INTRAMUSCULAR | Status: AC
  Administered 2016-05-11: 25 mg via INTRAVENOUS

## 2016-05-11 MED ORDER — DIPHENHYDRAMINE HCL 50 MG/ML IJ SOLN
INTRAMUSCULAR | Status: AC
  Filled 2016-05-11: qty 1

## 2016-05-11 MED ORDER — SODIUM CHLORIDE 0.9 % IV SOLN: Freq: Once | INTRAVENOUS | Status: DC

## 2016-05-11 MED ORDER — FAMOTIDINE IN NACL 20-0.9 MG/50ML-% IV SOLN
20.0000 mg | Freq: Once | INTRAVENOUS | Status: AC
  Administered 2016-05-11: 20 mg via INTRAVENOUS

## 2016-05-11 MED ORDER — SODIUM CHLORIDE 0.9 % IV SOLN
Freq: Once | INTRAVENOUS | Status: AC
  Administered 2016-05-11: 14:00:00 via INTRAVENOUS

## 2016-05-11 MED ORDER — SODIUM CHLORIDE 0.9 % IV SOLN
80.0000 mg/m2 | Freq: Once | INTRAVENOUS | Status: AC
  Administered 2016-05-11: 174 mg via INTRAVENOUS
  Filled 2016-05-11: qty 29

## 2016-05-11 MED ORDER — SODIUM CHLORIDE 0.9 % IV SOLN: 20.0000 mg | Freq: Once | INTRAVENOUS | Status: DC

## 2016-05-11 NOTE — Telephone Encounter (Signed)
appt made and avs printed °

## 2016-05-11 NOTE — Progress Notes (Signed)
OFFICE PROGRESS NOTE   May 11, 2016   Physicians: Dorann Ou, Maury Dus, MD; V.Mody, F.Alusio, R.Buccini, Alphonsa Overall   INTERVAL HISTORY:  Patient is seen, together with close friend, in continuing attention to IIIC1 high grade serous endometrial carcinoma for whch she began adjuvant chemotherapy on 05-04-16  (day 1 cycle 1 dose dense carbo taxol). She tolerated first treatment well other than constipation and difficult peripheral IV access.   Patient had no bowel movement x 6 days despite some miralax, tho bowels moved a large amount with increase in miralax + stool softeners; she did not need to use mag citrate. She had some nausea with the marked constipation, but appetite good since bowels have moved. She had no significant taxol aches, no bleeding, no fever or symptoms of infection. She has large bruise from difficult IV access LUE, requests PAC. She denies peripheral neuropathy. She did take premed decadron 20 mg 12 hrs prior to day 8 cycle 1 taxol today.  Remainder of 10 point Review of Systems negative.     No central catheter No genetics testing.  Patient has cruise planned Oct 7-14 which we will work around.  ONCOLOGIC HISTORY Patient had one episode of heavier vaginal bleeding on 01-28-16, then two later episodes of spotting. She saw Dr Benjie Karvonen, with [place] and D&C 03-04-16 demonstrating high grade serous endometrial carcinoma ([id number]). She was referred to Dr Denman George, with CT CAP 03-24-16 with 20 mm endometrium, 2 mildly enlarged left external iliac nodes and a mildly enlarged left para aortic node, but without evidence of other distant disease. She had robotic total hysterectomy BSO and sentinel node evaluation by Dr Denman George on 03-26-16, with pathology [id number]) high grade serous carcinoma of endometrium extending into outer half of myometrium, + LVSI, involved left obturator sentinel node with 3 other sentinel nodes negative, ovaries and tubes not involved. Post operative course was not  remarkable. She completed 14 days of prophylactic lovenox postoperatively. SHe saw Dr Denman George in follow up on 04-20-16, doing well from surgery, recommendation for 6 cycles of carboplatin taxol and consideration of HDR at completion of chemo if doing well. Day 1 cycle 1 dose dense carbo taxol was given 05-04-16.    Objective:  Vital signs in last 24 hours:  BP 136/71 (BP Location: Right Arm, Patient Position: Sitting)   Pulse 98   Temp 98 F (36.7 C) (Oral)   Resp 18   Ht '5\' 2"'  (1.575 m)   Wt 232 lb 6.4 oz (105.4 kg)   SpO2 98%   BMI 42.51 kg/m  Weight down 1 lb, looks comfortable, talkative and in good spirits. Alert, oriented and appropriate. Ambulatory with some difficulty due to morbid obesity No alopecia  HEENT:PERRL, sclerae not icteric. Oral mucosa moist without lesions, posterior pharynx clear.  Neck supple. No JVD.  Lymphatics:no cervical,supraclavicular adenopathy Resp: clear to auscultation bilaterally and normal percussion bilaterally Cardio: regular rate and rhythm. No gallop. GI: abdomen obese, soft, nontender, not obviously distended, no appreciable mass or organomegaly. Normally active bowel sounds. Surgical incisions not remarkable. Musculoskeletal/ Extremities: without pitting edema, cords, tenderness Neuro: no peripheral neuropathy. Otherwise nonfocal. PSYCH appropriate mood and affect. Skin Resolving ecchymosis left forearm at site of attempted IV last week, otherwise without rash, ecchymosis, petechiae   Lab Results:  Results for orders placed or performed in visit on 05/11/16  CBC with Differential  Result Value Ref Range   WBC 3.9 3.9 - 10.3 10e3/uL   NEUT# 3.6 1.5 - 6.5 10e3/uL   HGB  12.3 11.6 - 15.9 g/dL   HCT 38.0 34.8 - 46.6 %   Platelets 251 145 - 400 10e3/uL   MCV 91.0 79.5 - 101.0 fL   MCH 29.4 25.1 - 34.0 pg   MCHC 32.3 31.5 - 36.0 g/dL   RBC 4.18 3.70 - 5.45 10e6/uL   RDW 14.2 11.2 - 14.5 %   lymph# 0.3 (L) 0.9 - 3.3 10e3/uL   MONO# 0.0 (L)  0.1 - 0.9 10e3/uL   Eosinophils Absolute 0.0 0.0 - 0.5 10e3/uL   Basophils Absolute 0.0 0.0 - 0.1 10e3/uL   NEUT% 91.8 (H) 38.4 - 76.8 %   LYMPH% 7.7 (L) 14.0 - 49.7 %   MONO% 0.4 0.0 - 14.0 %   EOS% 0.0 0.0 - 7.0 %   BASO% 0.1 0.0 - 2.0 %  Comprehensive metabolic panel  Result Value Ref Range   Sodium 135 (L) 136 - 145 mEq/L   Potassium 5.0 3.5 - 5.1 mEq/L   Chloride 103 98 - 109 mEq/L   CO2 24 22 - 29 mEq/L   Glucose 204 (H) 70 - 140 mg/dl   BUN 24.8 7.0 - 26.0 mg/dL   Creatinine 0.9 0.6 - 1.1 mg/dL   Total Bilirubin 0.73 0.20 - 1.20 mg/dL   Alkaline Phosphatase 99 40 - 150 U/L   AST 21 5 - 34 U/L   ALT 23 0 - 55 U/L   Total Protein 6.4 6.4 - 8.3 g/dL   Albumin 3.2 (L) 3.5 - 5.0 g/dL   Calcium 10.2 8.4 - 10.4 mg/dL   Anion Gap 8 3 - 11 mEq/L   EGFR 60 (L) >90 ml/min/1.73 m2   CBC reviewed with patient, no gCSF needed yet and counts ok for planned day 8 taxol today  Studies/Results:  No results found.  Medications: I have reviewed the patient's current medications. She took premed decadron 20 mg 12 hrs prior to taxol as instructed.   DISCUSSION If not for the severe constipation, patient feels that she would have done very well with start of chemo. She will use miralax daily now and can increase if >24 hrs without bowel movement.   She is interested in [facility], agrees with having this set up with IR. Infusion RN to discuss with her also today.    Assessment/Plan: 1. IIIC1 high grade serous endometrial carcinoma: post robotic assisted total hysterectomy BSO and sentinel node evaluation by Dr Denman George 03-26-16.  Plan 6 cycles of carboplatin taxol as adjuvant therapy, dose dense weekly regimen begun 05-04-16. She will have day 8 cycle 1 taxol today. Granix approved if needed.  2.inadequate peripheral IV access, will request PAC by IR 3.history LE DVT 20 years ago. Completed prophylactic lovenox following gyn onc surgery postoperatively, no findings of concern on exam now.   4.borderline DM, nonfasting blood sugar 140 today. Expect transient elevation of blood sugars with steroids for taxol,  SS insulin in chemo orders.  5.degenerative disease and scoliosis LS with chronic back pain. Known to Dr Maureen Ralphs, knee replacements 6.obstructive sleep apnea on CPAP 7.urinary frequency with related sleep disturbance, long-standing. UA preop noted, follow up urine culture negative 05-04-16 8. Post lap chole and umbilical hernia repair. HH. GERD controlled on prilosce 9.HTN controlled. Elevated lipids. CAD by CT. Atherosclerosis thoracic and abdominal aorta on CT 10.hypothyroid on synthroid by Dr Alyson Ingles 11.up to date colonoscopy, no polyps per patient 12.morbid obesity  All questions answered and she knows to call at any time if needed. Chemo orders confirmed. Time spent  25 min including >50% counseling and coordination of care. CC Dr Alyson Ingles   Evlyn Clines, MD   05/11/2016, 8:05 PM

## 2016-05-11 NOTE — Patient Instructions (Signed)
[facility] Discharge Instructions for Patients Receiving Chemotherapy  Today you received the following chemotherapy agent: Taxol   To help prevent nausea and vomiting after your treatment, we encourage you to take your nausea medication as prescribed.    If you develop nausea and vomiting that is not controlled by your nausea medication, call the clinic.   BELOW ARE SYMPTOMS THAT SHOULD BE REPORTED IMMEDIATELY:  *FEVER GREATER THAN 100.5 F  *CHILLS WITH OR WITHOUT FEVER  NAUSEA AND VOMITING THAT IS NOT CONTROLLED WITH YOUR NAUSEA MEDICATION  *UNUSUAL SHORTNESS OF BREATH  *UNUSUAL BRUISING OR BLEEDING  TENDERNESS IN MOUTH AND THROAT WITH OR WITHOUT PRESENCE OF ULCERS  *URINARY PROBLEMS  *BOWEL PROBLEMS  UNUSUAL RASH Items with * indicate a potential emergency and should be followed up as soon as possible.  Feel free to call the clinic you have any questions or concerns. The clinic phone number is [phone].  Please show the CHEMO ALERT CARD at check-in to the Emergency Department and triage nurse.   

## 2016-05-12 ENCOUNTER — Other Ambulatory Visit: Payer: Self-pay | Admitting: Oncology

## 2016-05-12 MED ORDER — LIDOCAINE-PRILOCAINE 2.5-2.5 % EX CREA: TOPICAL_CREAM | CUTANEOUS | 1 refills | Status: DC

## 2016-05-13 ENCOUNTER — Other Ambulatory Visit: Payer: Self-pay | Admitting: Radiology

## 2016-05-14 ENCOUNTER — Other Ambulatory Visit: Payer: Self-pay | Admitting: Radiology

## 2016-05-14 ENCOUNTER — Other Ambulatory Visit: Payer: Self-pay | Admitting: General Surgery

## 2016-05-15 ENCOUNTER — Other Ambulatory Visit: Payer: Self-pay | Admitting: Oncology

## 2016-05-15 ENCOUNTER — Ambulatory Visit (HOSPITAL_COMMUNITY)
Admission: RE | Admit: 2016-05-15 | Discharge: 2016-05-15 | Disposition: A | Discharge status: Home / Self Care | Payer: Medicare Other | Source: Ambulatory Visit | Attending: Oncology | Admitting: Oncology

## 2016-05-15 ENCOUNTER — Encounter (HOSPITAL_COMMUNITY): Payer: Self-pay

## 2016-05-15 DIAGNOSIS — G4733 Obstructive sleep apnea (adult) (pediatric): Secondary | ICD-10-CM | POA: Insufficient documentation

## 2016-05-15 DIAGNOSIS — Z823 Family history of stroke: Secondary | ICD-10-CM | POA: Diagnosis not present

## 2016-05-15 DIAGNOSIS — Z90710 Acquired absence of both cervix and uterus: Secondary | ICD-10-CM | POA: Insufficient documentation

## 2016-05-15 DIAGNOSIS — C541 Malignant neoplasm of endometrium: Secondary | ICD-10-CM | POA: Insufficient documentation

## 2016-05-15 DIAGNOSIS — Z5111 Encounter for antineoplastic chemotherapy: Secondary | ICD-10-CM | POA: Diagnosis not present

## 2016-05-15 DIAGNOSIS — I878 Other specified disorders of veins: Secondary | ICD-10-CM

## 2016-05-15 DIAGNOSIS — K219 Gastro-esophageal reflux disease without esophagitis: Secondary | ICD-10-CM | POA: Diagnosis not present

## 2016-05-15 DIAGNOSIS — Z6841 Body Mass Index (BMI) 40.0 and over, adult: Secondary | ICD-10-CM | POA: Diagnosis not present

## 2016-05-15 DIAGNOSIS — M479 Spondylosis, unspecified: Secondary | ICD-10-CM | POA: Insufficient documentation

## 2016-05-15 DIAGNOSIS — M549 Dorsalgia, unspecified: Secondary | ICD-10-CM | POA: Diagnosis not present

## 2016-05-15 DIAGNOSIS — Z9221 Personal history of antineoplastic chemotherapy: Secondary | ICD-10-CM | POA: Insufficient documentation

## 2016-05-15 DIAGNOSIS — E039 Hypothyroidism, unspecified: Secondary | ICD-10-CM | POA: Insufficient documentation

## 2016-05-15 DIAGNOSIS — Z96653 Presence of artificial knee joint, bilateral: Secondary | ICD-10-CM | POA: Insufficient documentation

## 2016-05-15 DIAGNOSIS — E119 Type 2 diabetes mellitus without complications: Secondary | ICD-10-CM | POA: Insufficient documentation

## 2016-05-15 DIAGNOSIS — I10 Essential (primary) hypertension: Secondary | ICD-10-CM | POA: Insufficient documentation

## 2016-05-15 DIAGNOSIS — F329 Major depressive disorder, single episode, unspecified: Secondary | ICD-10-CM | POA: Diagnosis not present

## 2016-05-15 DIAGNOSIS — Z8249 Family history of ischemic heart disease and other diseases of the circulatory system: Secondary | ICD-10-CM | POA: Diagnosis not present

## 2016-05-15 DIAGNOSIS — E785 Hyperlipidemia, unspecified: Secondary | ICD-10-CM | POA: Insufficient documentation

## 2016-05-15 HISTORY — PX: IR GENERIC HISTORICAL: IMG1180011

## 2016-05-15 LAB — CBC WITH DIFFERENTIAL/PLATELET
BASOS ABS: 0 10*3/uL (ref 0.0–0.1)
BASOS PCT: 0 %
Eosinophils Absolute: 0.1 10*3/uL (ref 0.0–0.7)
Eosinophils Relative: 5 %
HEMATOCRIT: 33.1 % — AB (ref 36.0–46.0)
Hemoglobin: 10.8 g/dL — ABNORMAL LOW (ref 12.0–15.0)
LYMPHS PCT: 36 %
Lymphs Abs: 1 10*3/uL (ref 0.7–4.0)
MCH: 29.8 pg (ref 26.0–34.0)
MCHC: 32.6 g/dL (ref 30.0–36.0)
MCV: 91.4 fL (ref 78.0–100.0)
MONO ABS: 0.5 10*3/uL (ref 0.1–1.0)
Monocytes Relative: 19 %
NEUTROS ABS: 1 10*3/uL — AB (ref 1.7–7.7)
Neutrophils Relative %: 40 %
PLATELETS: 268 10*3/uL (ref 150–400)
RBC: 3.62 MIL/uL — AB (ref 3.87–5.11)
RDW: 13.9 % (ref 11.5–15.5)
WBC: 2.7 10*3/uL — AB (ref 4.0–10.5)

## 2016-05-15 LAB — BASIC METABOLIC PANEL
ANION GAP: 6 (ref 5–15)
BUN: 28 mg/dL — ABNORMAL HIGH (ref 6–20)
CALCIUM: 9.3 mg/dL (ref 8.9–10.3)
CO2: 29 mmol/L (ref 22–32)
Chloride: 101 mmol/L (ref 101–111)
Creatinine, Ser: 0.98 mg/dL (ref 0.44–1.00)
GFR, EST NON AFRICAN AMERICAN: 53 mL/min — AB (ref >60)
Glucose, Bld: 119 mg/dL — ABNORMAL HIGH (ref 65–99)
POTASSIUM: 4.7 mmol/L (ref 3.5–5.1)
SODIUM: 136 mmol/L (ref 135–145)

## 2016-05-15 LAB — GLUCOSE, CAPILLARY: GLUCOSE-CAPILLARY: 122 mg/dL — AB (ref 65–99)

## 2016-05-15 LAB — PROTIME-INR
INR: 1.05
Prothrombin Time: 13.8 seconds (ref 11.4–15.2)

## 2016-05-15 LAB — APTT: APTT: 27 s (ref 24–36)

## 2016-05-15 MED ORDER — MIDAZOLAM HCL 2 MG/2ML IJ SOLN
INTRAMUSCULAR | Status: AC | PRN
  Administered 2016-05-15 (×2): 1 mg via INTRAVENOUS

## 2016-05-15 MED ORDER — CEFAZOLIN SODIUM-DEXTROSE 2-4 GM/100ML-% IV SOLN
2.0000 g | INTRAVENOUS | Status: AC
  Administered 2016-05-15: 2 g via INTRAVENOUS
  Filled 2016-05-15: qty 100

## 2016-05-15 MED ORDER — FENTANYL CITRATE (PF) 100 MCG/2ML IJ SOLN
INTRAMUSCULAR | Status: AC
  Filled 2016-05-15: qty 4

## 2016-05-15 MED ORDER — FENTANYL CITRATE (PF) 100 MCG/2ML IJ SOLN
INTRAMUSCULAR | Status: AC | PRN
  Administered 2016-05-15: 50 ug via INTRAVENOUS

## 2016-05-15 MED ORDER — HEPARIN SOD (PORK) LOCK FLUSH 100 UNIT/ML IV SOLN
INTRAVENOUS | Status: AC
Start: 2016-05-15 — End: 2016-05-15
  Filled 2016-05-15: qty 5

## 2016-05-15 MED ORDER — SODIUM CHLORIDE 0.9 % IV SOLN
INTRAVENOUS | Status: DC
  Administered 2016-05-15: 10:00:00 via INTRAVENOUS

## 2016-05-15 MED ORDER — HEPARIN SOD (PORK) LOCK FLUSH 100 UNIT/ML IV SOLN
INTRAVENOUS | Status: AC | PRN
  Administered 2016-05-15: 500 [IU]

## 2016-05-15 MED ORDER — LIDOCAINE HCL 1 % IJ SOLN
INTRAMUSCULAR | Status: AC
  Filled 2016-05-15: qty 20

## 2016-05-15 MED ORDER — MIDAZOLAM HCL 2 MG/2ML IJ SOLN
INTRAMUSCULAR | Status: AC
  Filled 2016-05-15: qty 6

## 2016-05-15 NOTE — Consult Note (Signed)
Chief Complaint: Patient was seen in consultation today for port a cath placement  Referring Physician(s): Milner  Supervising Physician: Aletta Edouard  Patient Status: Outpatient  History of Present Illness: Melinda Kerr is a 81 y.o. 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 Chemotherapy was initiated 05/04/16. She has poor venous access and request now received for Port-A-Cath placement for additional chemotherapy. Additional history as listed below.  Past Medical History:  Diagnosis Date  . Cancer [facility])    endometrial cancer   . Depression   . Diabetes mellitus without complication (HCC)    borderline , diet controlled   . Esophageal reflux   . Hyperlipidemia    under control  . Hypertension   . Hypothyroidism   . Morbid obesity with BMI of 40.0-44.9, adult ([place])   . Nocturia   . OSA on CPAP   . Osteoarthritis   . Phlebitis    hx of  . Pneumonia    hx of   . Prediabetes    under control with diet  . Spondylosis    with scoliosis  . Urinary frequency     Past Surgical History:  Procedure Laterality Date  . CHOLECYSTECTOMY  age 26  . DILATATION & CURETTAGE/HYSTEROSCOPY WITH MYOSURE N/A 03/04/2016   Procedure: DILATATION & CURETTAGE/HYSTEROSCOPY ;  Surgeon: Azucena Fallen, MD;  Location: [facility];  Service: Gynecology;  Laterality: N/A;  polyp removal   . HERNIA REPAIR  age 6   umblical  . LYMPH NODE BIOPSY N/A 03/26/2016   Procedure: Sentinel LYMPH NODE BIOPSY;  Surgeon: Everitt Amber, MD;  Location: [facility];  Service: Gynecology;  Laterality: N/A;  . ROBOTIC ASSISTED TOTAL HYSTERECTOMY WITH BILATERAL SALPINGO OOPHERECTOMY N/A 03/26/2016   Procedure: XI ROBOTIC ASSISTED TOTAL Laproscopic HYSTERECTOMY WITH BILATERAL SALPINGO OOPHORECTOMY;  Surgeon: Everitt Amber, MD;  Location: [facility];  Service: Gynecology;  Laterality: N/A;  . TOTAL KNEE ARTHROPLASTY Left 10/09/2013   Procedure: LEFT TOTAL KNEE  ARTHROPLASTY;  Surgeon: [name], MD;  Location: [facility];  Service: Orthopedics;  Laterality: Left;  . TOTAL KNEE ARTHROPLASTY Right 04/09/2014   Procedure: RIGHT TOTAL KNEE ARTHROPLASTY;  Surgeon: [name], MD;  Location: [facility];  Service: Orthopedics;  Laterality: Right;  . TUBAL LIGATION  age 47  . VARICOSE VEIN SURGERY Bilateral age 89  . WISDOM TOOTH EXTRACTION  age 4    Allergies: Oxybutynin  Medications: Prior to Admission medications   Medication Sig Start Date End Date Taking? Authorizing Provider  acetaminophen (TYLENOL) 500 MG tablet Take 1,000 mg by mouth 2 (two) times daily.    Yes Historical Provider, MD  allopurinol (ZYLOPRIM) 300 MG tablet Take 300 mg by mouth daily with breakfast.    Yes Historical Provider, MD  calcium gluconate 500 MG tablet Takes 1 tablet in am and 2 in the evening.   Yes Historical Provider, MD  cetirizine (ZYRTEC) 10 MG tablet Take 10 mg by mouth daily.   Yes Historical Provider, MD  citalopram (CELEXA) 10 MG tablet Take 10 mg by mouth daily. 01/10/15  Yes Historical Provider, MD  Cyanocobalamin (VITAMIN B-12 PO) Take by mouth.   Yes Historical Provider, MD  Ferrous Sulfate 90 (18 Fe) MG TABS Take 1 tablet by mouth daily.   Yes Historical Provider, MD  HAWTHORNE BERRY PO Take 500 mg by mouth 2 (two) times daily.   Yes Historical Provider, MD  hydrOXYzine (ATARAX/VISTARIL) 25 MG tablet Take 25 mg  by mouth at bedtime.   Yes Historical Provider, MD  levothyroxine (SYNTHROID, LEVOTHROID) 125 MCG tablet Take 125 mcg by mouth daily before breakfast.   Yes Historical Provider, MD  lisinopril (PRINIVIL,ZESTRIL) 20 MG tablet Take 20 mg by mouth daily with breakfast.    Yes Historical Provider, MD  Multiple Vitamins-Minerals (MULTIVITAMIN WITH MINERALS) tablet Take 1 tablet by mouth daily.   Yes Historical Provider, MD  Omega-3 Fatty Acids (FISH OIL PO) Take 1 tablet by mouth daily.   Yes Historical Provider, MD  omeprazole (PRILOSEC OTC) 20 MG  tablet Take 20 mg by mouth daily.   Yes Historical Provider, MD  thiamine (VITAMIN B-1) 100 MG tablet Take 100 mg by mouth daily.   Yes Historical Provider, MD  tolterodine (DETROL LA) 4 MG 24 hr capsule Take 4 mg by mouth daily.   Yes Historical Provider, MD  traMADol (ULTRAM) 50 MG tablet Take 1-2 tablets (50-100 mg total) by mouth every 6 (six) hours as needed (mild pain). 04/12/14  Yes Arlee Muslim, PA-C  vitamin C (ASCORBIC ACID) 500 MG tablet Take 500 mg by mouth daily.   Yes Historical Provider, MD  dexamethasone (DECADRON) 4 MG tablet Take 5 tablets with food 12 hrs  prior to Taxol Chemotherapy. Patient not taking: Reported on 05/11/2016 05/05/16   Gordy Levan, MD  furosemide (LASIX) 20 MG tablet Take 20 mg by mouth daily. 01/10/15   Historical Provider, MD  lidocaine-prilocaine (EMLA) cream Apply 1-2 hours prior to Porta-Cath access as directed 05/12/16   Lennis P Marko Plume, MD  LORazepam (ATIVAN) 0.5 MG tablet Place 1 tablet under the tongue or swallow every 6 hrs as needed for nausea. Will Make drowsy. 05/01/16   Lennis Marion Downer, MD  ondansetron (ZOFRAN) 8 MG tablet Take 1 tablet (8 mg total) by mouth every 8 (eight) hours as needed for nausea or vomiting. 05/01/16   Lennis Marion Downer, MD  polyethylene glycol (MIRALAX / GLYCOLAX) packet Take 17 g by mouth daily as needed for mild constipation or moderate constipation. 04/12/14   Arlee Muslim, PA-C     Family History  Problem Relation Age of Onset  . Heart disease Mother   . Heart disease Father   . CVA Father     Social History   Social History  . Marital status: Widowed    Spouse name: N/A  . Number of children: N/A  . Years of education: N/A   Social History Main Topics  . Smoking status: Never Smoker  . Smokeless tobacco: Never Used  . Alcohol use No  . Drug use: No  . Sexual activity: Not Currently   Other Topics Concern  . Not on file   Social History Narrative  . No narrative on file      Review of Systems  currently denies fever, HA, CP, dyspnea, cough, abd pain, N/V or bleeding. She does have intermittent back pain.   Vital Signs: BP 129/75 (BP Location: Right Wrist)   Pulse 84   Temp 97.9 F (36.6 C) (Oral)   Resp 16   SpO2 100%   Physical Exam awake/alert; chest- CTA bilat; heart- RRR; abd- soft,+BS, obese, NT;LE with 2+ edema  Mallampati Score:     Imaging: No results found.  Labs:  CBC:  Recent Labs  03/27/16 0426 04/24/16 0816 05/04/16 1006 05/11/16 1153  WBC 13.6* 5.7 7.7 3.9  HGB 12.5 12.4 13.2 12.3  HCT 38.2 38.1 41.3 38.0  PLT 316 392 327 251  COAGS: No results for input(s): INR, APTT in the last 8760 hours.  BMP:  Recent Labs  03/03/16 1635  03/27/16 0426 04/24/16 0816 05/04/16 1006 05/11/16 1154  NA 136  < > 138 139 137 135*  K 4.9  < > 5.2* 4.5 4.7 5.0  CL 102  --  106  --   --   --   CO2 27  < > 28 27 22 24   GLUCOSE 118*  < > 142* 141* 287* 204*  BUN 29*  < > 14 14.5 31.0* 24.8  CALCIUM 9.8  < > 9.0 10.4 10.5* 10.2  CREATININE 1.04*  < > 0.94 1.0 1.2* 0.9  GFRNONAA 50*  --  56*  --   --   --   GFRAA 58*  --  >60  --   --   --   < > = values in this interval not displayed.  LIVER FUNCTION TESTS:  Recent Labs  03/03/16 1635 04/24/16 0816 05/04/16 1006 05/11/16 1154  BILITOT 0.5 0.56 0.50 0.73  AST 24 22 23 21   ALT 16 17 22 23   ALKPHOS 95 137 126 99  PROT 6.6 6.8 7.0 6.4  ALBUMIN 3.9 3.3* 3.5 3.2*    TUMOR MARKERS: No results for input(s): AFPTM, CEA, CA199, CHROMGRNA in the last 8760 hours.  Assessment and Plan: 81 y.o. 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 Chemotherapy was initiated 05/04/16. She has poor venous access and request now received for Port-A-Cath placement for additional chemotherapy. Risks and benefits discussed with the patient/niece including, but not limited to bleeding, infection, pneumothorax, or fibrin sheath development and need for additional  procedures.All of the patient's questions were answered, patient is agreeable to proceed.Consent signed and in chart.Labs pending.      Thank you for this interesting consult.  I greatly enjoyed meeting Hali L Snover and look forward to participating in their care.  A copy of this report was sent to the requesting provider on this date.  Electronically Signed: D. Rowe Robert 05/15/2016, 10:30 AM   I spent a total of 20 minutes in face to face in clinical consultation, greater than 50% of which was counseling/coordinating care for port a cath placement

## 2016-05-15 NOTE — Procedures (Signed)
Interventional Radiology Procedure Note  Procedure: Placement of a right IJ approach single lumen PowerPort.  Tip is positioned at the superior cavoatrial junction and catheter is ready for immediate use.  Complications: No immediate Recommendations:  - Ok to shower tomorrow - Do not submerge for 7 days - Routine line care   Jeremyah Jelley T. Lolita Faulds, M.D Pager:  [phone]   

## 2016-05-15 NOTE — Discharge Instructions (Signed)
Implanted Port Home Guide °An implanted port is a type of central line that is placed under the skin. Central lines are used to provide IV access when treatment or nutrition needs to be given through a person's veins. Implanted ports are used for long-term IV access. An implanted port may be placed because:  °· You need IV medicine that would be irritating to the small veins in your hands or arms.   °· You need long-term IV medicines, such as antibiotics.   °· You need IV nutrition for a long period.   °· You need frequent blood draws for lab tests.   °· You need dialysis.   °Implanted ports are usually placed in the chest area, but they can also be placed in the upper arm, the abdomen, or the leg. An implanted port has two main parts:  °· Reservoir. The reservoir is round and will appear as a small, raised area under your skin. The reservoir is the part where a needle is inserted to give medicines or draw blood.   °· Catheter. The catheter is a thin, flexible tube that extends from the reservoir. The catheter is placed into a large vein. Medicine that is inserted into the reservoir goes into the catheter and then into the vein.   °HOW WILL I CARE FOR MY INCISION SITE? °Do not get the incision site wet. Bathe or shower as directed by your health care provider.  °HOW IS MY PORT ACCESSED? °Special steps must be taken to access the port:  °· Before the port is accessed, a numbing cream can be placed on the skin. This helps numb the skin over the port site.   °· Your health care provider uses a sterile technique to access the port. °· Your health care provider must put on a mask and sterile gloves. °· The skin over your port is cleaned carefully with an antiseptic and allowed to dry. °· The port is gently pinched between sterile gloves, and a needle is inserted into the port. °· Only "non-coring" port needles should be used to access the port. Once the port is accessed, a blood return should be checked. This helps  ensure that the port is in the vein and is not clogged.   °· If your port needs to remain accessed for a constant infusion, a clear (transparent) bandage will be placed over the needle site. The bandage and needle will need to be changed every week, or as directed by your health care provider.   °· Keep the bandage covering the needle clean and dry. Do not get it wet. Follow your health care provider's instructions on how to take a shower or bath while the port is accessed.   °· If your port does not need to stay accessed, no bandage is needed over the port.   °WHAT IS FLUSHING? °Flushing helps keep the port from getting clogged. Follow your health care provider's instructions on how and when to flush the port. Ports are usually flushed with saline solution or a medicine called heparin. The need for flushing will depend on how the port is used.  °· If the port is used for intermittent medicines or blood draws, the port will need to be flushed:   °· After medicines have been given.   °· After blood has been drawn.   °· As part of routine maintenance.   °· If a constant infusion is running, the port may not need to be flushed.   °HOW LONG WILL MY PORT STAY IMPLANTED? °The port can stay in for as long as your health care   provider thinks it is needed. When it is time for the port to come out, surgery will be done to remove it. The procedure is similar to the one performed when the port was put in.  °WHEN SHOULD I SEEK IMMEDIATE MEDICAL CARE? °When you have an implanted port, you should seek immediate medical care if:  °· You notice a bad smell coming from the incision site.   °· You have swelling, redness, or drainage at the incision site.   °· You have more swelling or pain at the port site or the surrounding area.   °· You have a fever that is not controlled with medicine. °  °This information is not intended to replace advice given to you by your health care provider. Make sure you discuss any questions you have with  your health care provider. °  °Document Released: 09/14/2005 Document Revised: 07/05/2013 Document Reviewed: 05/22/2013 °Elsevier Interactive Patient Education ©2016 [employer] °Implanted Port Insertion, Care After °Refer to this sheet in the next few weeks. These instructions provide you with information on caring for yourself after your procedure. Your health care provider may also give you more specific instructions. Your treatment has been planned according to current medical practices, but problems sometimes occur. Call your health care provider if you have any problems or questions after your procedure. °WHAT TO EXPECT AFTER THE PROCEDURE °After your procedure, it is typical to have the following:  °· Discomfort at the port insertion site. Ice packs to the area will help. °· Bruising on the skin over the port. This will subside in 3-4 days. °HOME CARE INSTRUCTIONS °· After your port is placed, you will get a manufacturer's information card. The card has information about your port. Keep this card with you at all times.   °· Know what kind of port you have. There are many types of ports available.   °· Wear a medical alert bracelet in case of an emergency. This can help alert health care workers that you have a port.   °· The port can stay in for as long as your health care provider believes it is necessary.   °· A home health care nurse may give medicines and take care of the port.   °· You or a family member can get special training and directions for giving medicine and taking care of the port at home.   °SEEK MEDICAL CARE IF:  °· Your port does not flush or you are unable to get a blood return.   °· You have a fever or chills. °SEEK IMMEDIATE MEDICAL CARE IF: °· You have new fluid or pus coming from your incision.   °· You notice a bad smell coming from your incision site.   °· You have swelling, pain, or more redness at the incision or port site.   °· You have chest pain or shortness of breath. °  °This  information is not intended to replace advice given to you by your health care provider. Make sure you discuss any questions you have with your health care provider. °  °Document Released: 07/05/2013 Document Revised: 09/19/2013 Document Reviewed: 07/05/2013 °Elsevier Interactive Patient Education ©2016 [employer] °Moderate Conscious Sedation, Adult, Care After °Refer to this sheet in the next few weeks. These instructions provide you with information on caring for yourself after your procedure. Your health care provider may also give you more specific instructions. Your treatment has been planned according to current medical practices, but problems sometimes occur. Call your health care provider if you have any problems or questions after your   procedure. °WHAT TO EXPECT AFTER THE PROCEDURE  °After your procedure: °· You may feel sleepy, clumsy, and have poor balance for several hours. °· Vomiting may occur if you eat too soon after the procedure. °HOME CARE INSTRUCTIONS °· Do not participate in any activities where you could become injured for at least 24 hours. Do not: °¨ Drive. °¨ Swim. °¨ Ride a bicycle. °¨ Operate heavy machinery. °¨ Cook. °¨ Use power tools. °¨ Climb ladders. °¨ Work from a high place. °· Do not make important decisions or sign legal documents until you are improved. °· If you vomit, drink water, juice, or soup when you can drink without vomiting. Make sure you have little or no nausea before eating solid foods. °· Only take over-the-counter or prescription medicines for pain, discomfort, or fever as directed by your health care provider. °· Make sure you and your family fully understand everything about the medicines given to you, including what side effects may occur. °· You should not drink alcohol, take sleeping pills, or take medicines that cause drowsiness for at least 24 hours. °· If you smoke, do not smoke without supervision. °· If you are feeling better, you may resume normal  activities 24 hours after you were sedated. °· Keep all appointments with your health care provider. °SEEK MEDICAL CARE IF: °· Your skin is pale or bluish in color. °· You continue to feel nauseous or vomit. °· Your pain is getting worse and is not helped by medicine. °· You have bleeding or swelling. °· You are still sleepy or feeling clumsy after 24 hours. °SEEK IMMEDIATE MEDICAL CARE IF: °· You develop a rash. °· You have difficulty breathing. °· You develop any type of allergic problem. °· You have a fever. °MAKE SURE YOU: °· Understand these instructions. °· Will watch your condition. °· Will get help right away if you are not doing well or get worse. °  °This information is not intended to replace advice given to you by your health care provider. Make sure you discuss any questions you have with your health care provider. °  °Document Released: 07/05/2013 Document Revised: 10/05/2014 Document Reviewed: 07/05/2013 °Elsevier Interactive Patient Education ©2016 [employer] ° °

## 2016-05-17 ENCOUNTER — Encounter (HOSPITAL_COMMUNITY): Payer: Self-pay | Admitting: Interventional Radiology

## 2016-05-18 ENCOUNTER — Ambulatory Visit (HOSPITAL_BASED_OUTPATIENT_CLINIC_OR_DEPARTMENT_OTHER): Payer: Medicare Other

## 2016-05-18 ENCOUNTER — Other Ambulatory Visit: Payer: Self-pay | Admitting: Oncology

## 2016-05-18 ENCOUNTER — Ambulatory Visit: Payer: Medicare Other

## 2016-05-18 ENCOUNTER — Other Ambulatory Visit (HOSPITAL_BASED_OUTPATIENT_CLINIC_OR_DEPARTMENT_OTHER): Payer: Medicare Other

## 2016-05-18 VITALS — BP 106/55 | HR 90 | Temp 98.4°F | Resp 17

## 2016-05-18 DIAGNOSIS — Z5189 Encounter for other specified aftercare: Secondary | ICD-10-CM | POA: Diagnosis not present

## 2016-05-18 DIAGNOSIS — C541 Malignant neoplasm of endometrium: Secondary | ICD-10-CM

## 2016-05-18 LAB — CBC WITH DIFFERENTIAL/PLATELET
BASO%: 0.4 % (ref 0.0–2.0)
BASOS ABS: 0 10*3/uL (ref 0.0–0.1)
EOS%: 0 % (ref 0.0–7.0)
Eosinophils Absolute: 0 10*3/uL (ref 0.0–0.5)
HEMATOCRIT: 35.9 % (ref 34.8–46.6)
HEMOGLOBIN: 11.5 g/dL — AB (ref 11.6–15.9)
LYMPH#: 0.4 10*3/uL — AB (ref 0.9–3.3)
LYMPH%: 31.3 % (ref 14.0–49.7)
MCH: 29.5 pg (ref 25.1–34.0)
MCHC: 32.1 g/dL (ref 31.5–36.0)
MCV: 92 fL (ref 79.5–101.0)
MONO#: 0.1 10*3/uL (ref 0.1–0.9)
MONO%: 7.8 % (ref 0.0–14.0)
NEUT#: 0.8 10*3/uL — ABNORMAL LOW (ref 1.5–6.5)
NEUT%: 60.5 % (ref 38.4–76.8)
PLATELETS: 216 10*3/uL (ref 145–400)
RBC: 3.9 10*6/uL (ref 3.70–5.45)
RDW: 14.7 % — AB (ref 11.2–14.5)
WBC: 1.3 10*3/uL — ABNORMAL LOW (ref 3.9–10.3)

## 2016-05-18 LAB — COMPREHENSIVE METABOLIC PANEL
ALBUMIN: 3.3 g/dL — AB (ref 3.5–5.0)
ALT: 18 U/L (ref 0–55)
AST: 18 U/L (ref 5–34)
Alkaline Phosphatase: 99 U/L (ref 40–150)
Anion Gap: 8 mEq/L (ref 3–11)
BUN: 19.9 mg/dL (ref 7.0–26.0)
CO2: 23 mEq/L (ref 22–29)
CREATININE: 0.9 mg/dL (ref 0.6–1.1)
Calcium: 9.9 mg/dL (ref 8.4–10.4)
Chloride: 106 mEq/L (ref 98–109)
EGFR: 59 mL/min/{1.73_m2} — ABNORMAL LOW (ref >90)
Glucose: 214 mg/dl — ABNORMAL HIGH (ref 70–140)
POTASSIUM: 4.8 meq/L (ref 3.5–5.1)
Sodium: 137 mEq/L (ref 136–145)
Total Bilirubin: 0.32 mg/dL (ref 0.20–1.20)
Total Protein: 6.5 g/dL (ref 6.4–8.3)

## 2016-05-18 MED ORDER — HEPARIN SOD (PORK) LOCK FLUSH 100 UNIT/ML IV SOLN
500.0000 [IU] | Freq: Once | INTRAVENOUS | Status: AC
  Administered 2016-05-18: 500 [IU] via INTRAVENOUS
  Filled 2016-05-18: qty 5

## 2016-05-18 MED ORDER — TBO-FILGRASTIM 480 MCG/0.8ML ~~LOC~~ SOSY
480.0000 ug | PREFILLED_SYRINGE | Freq: Once | SUBCUTANEOUS | Status: AC
  Administered 2016-05-18: 480 ug via SUBCUTANEOUS
  Filled 2016-05-18: qty 0.8

## 2016-05-18 NOTE — Progress Notes (Signed)
Granix given by Infusion Room Nurse

## 2016-05-18 NOTE — Progress Notes (Signed)
Chemo held today d/t ANC of 0.8.  Granix injection given per Dr. Marko Plume. Appt made for Granix tomorrow and Wednesday if needed.  Neutropenic precautions reviewed with patient.  All questions answered and pt verbalized understanding.

## 2016-05-18 NOTE — Patient Instructions (Addendum)
Neutropenia Neutropenia is a condition that occurs when the level of a certain type of white blood cell (neutrophil) in your body becomes lower than normal. Neutrophils are made in the bone marrow and fight infections. These cells protect against bacteria and viruses. The fewer neutrophils you have, and the longer your body remains without them, the greater your risk of getting a severe infection becomes. CAUSES  The cause of neutropenia may be hard to determine. However, it is usually due to 3 main problems:   Decreased production of neutrophils. This may be due to:  Certain medicines such as chemotherapy.  Genetic problems.  Cancer.  Radiation treatments.  Vitamin deficiency.  Some pesticides.  Increased destruction of neutrophils. This may be due to:  Overwhelming infections.  Hemolytic anemia. This is when the body destroys its own blood cells.  Chemotherapy.  Neutrophils moving to areas of the body where they cannot fight infections. This may be due to:  Dialysis procedures.  Conditions where the spleen becomes enlarged. Neutrophils are held in the spleen and are not available to the rest of the body.  Overwhelming infections. The neutrophils are held in the area of the infection and are not available to the rest of the body. SYMPTOMS  There are no specific symptoms of neutropenia. The lack of neutrophils can result in an infection, and an infection can cause various problems. DIAGNOSIS  Diagnosis is made by a blood test. A complete blood count is performed. The normal level of neutrophils in human blood differs with age and race. Infants have lower counts than older children and adults. African Americans have lower counts than Caucasians or Asians. The average adult level is 1500 cells/mm3 of blood. Neutrophil counts are interpreted as follows:  Greater than 1000 cells/mm3 gives normal protection against infection.  500 to 1000 cells/mm3 gives an increased risk for  infection.  200 to 500 cells/mm3 is a greater risk for severe infection.  Lower than 200 cells/mm3 is a marked risk of infection. This may require hospitalization and treatment with antibiotic medicines. TREATMENT  Treatment depends on the underlying cause, severity, and presence of infections or symptoms. It also depends on your health. Your caregiver will discuss the treatment plan with you. Mild cases are often easily treated and have a good outcome. Preventative measures may also be started to limit your risk of infections. Treatment can include:  Taking antibiotics.  Stopping medicines that are known to cause neutropenia.  Correcting nutritional deficiencies by eating green vegetables to supply folic acid and taking vitamin B supplements.  Stopping exposure to pesticides if your neutropenia is related to pesticide exposure.  Taking a blood growth factor called sargramostim, pegfilgrastim, or filgrastim if you are undergoing chemotherapy for cancer. This stimulates white blood cell production.  Removal of the spleen if you have Felty's syndrome and have repeated infections. HOME CARE INSTRUCTIONS   Follow your caregiver's instructions about when you need to have blood work done.  Wash your hands often. Make sure others who come in contact with you also wash their hands.  Wash raw fruits and vegetables before eating them. They can carry bacteria and fungi.  Avoid people with colds or spreadable (contagious) diseases (chickenpox, herpes zoster, influenza).  Avoid large crowds.  Avoid construction areas. The dust can release fungus into the air.  Be cautious around children in daycare or school environments.  Take care of your respiratory system by coughing and deep breathing.  Bathe daily.  Protect your skin from cuts and   burns.  Do not work in the garden or with flowers and plants.  Care for the mouth before and after meals by brushing with a soft toothbrush. If you have  mucositis, do not use mouthwash. Mouthwash contains alcohol and can dry out the mouth even more.  Clean the area between the genitals and the anus (perineal area) after urination and bowel movements. Women need to wipe from front to back.  Use a water soluble lubricant during sexual intercourse and practice good hygiene after. Do not have intercourse if you are severely neutropenic. Check with your caregiver for guidelines.  Exercise daily as tolerated.  Avoid people who were vaccinated with a live vaccine in the past 30 days. You should not receive live vaccines (polio, typhoid).  Do not provide direct care for pets. Avoid animal droppings. Do not clean litter boxes and bird cages.  Do not share food utensils.  Do not use tampons, enemas, or rectal suppositories unless directed by your caregiver.  Use an electric razor to remove hair.  Wash your hands after handling magazines, letters, and newspapers. SEEK IMMEDIATE MEDICAL CARE IF:   You have a fever.  You have chills or start to shake.  You feel nauseous or vomit.  You develop mouth sores.  You develop aches and pains.  You have redness and swelling around open wounds.  Your skin is warm to the touch.  You have pus coming from your wounds.  You develop swollen lymph nodes.  You feel weak or fatigued.  You develop red streaks on the skin. MAKE SURE YOU:  Understand these instructions.  Will watch your condition.  Will get help right away if you are not doing well or get worse.   This information is not intended to replace advice given to you by your health care provider. Make sure you discuss any questions you have with your health care provider.   Document Released: 03/06/2002 Document Revised: 12/07/2011 Document Reviewed: 03/27/2015 Elsevier Interactive Patient Education 2016 [employer] Tbo-Filgrastim injection What is this medicine? TBO-FILGRASTIM (T B O fil GRA stim) is a granulocyte  colony-stimulating factor that stimulates the growth of neutrophils, a type of white blood cell important in the body's fight against infection. It is used to reduce the incidence of fever and infection in patients with certain types of cancer who are receiving chemotherapy that affects the bone marrow. This medicine may be used for other purposes; ask your health care provider or pharmacist if you have questions. What should I tell my health care provider before I take this medicine? They need to know if you have any of these conditions: -ongoing radiation therapy -sickle cell anemia -an unusual or allergic reaction to tbo-filgrastim, filgrastim, pegfilgrastim, other medicines, foods, dyes, or preservatives -pregnant or trying to get pregnant -breast-feeding How should I use this medicine? This medicine is for injection under the skin. If you get this medicine at home, you will be taught how to prepare and give this medicine. Refer to the Instructions for Use that come with your medication packaging. Use exactly as directed. Take your medicine at regular intervals. Do not take your medicine more often than directed. It is important that you put your used needles and syringes in a special sharps container. Do not put them in a trash can. If you do not have a sharps container, call your pharmacist or healthcare provider to get one. Talk to your pediatrician regarding the use of this medicine in children. Special care may be   needed. Overdosage: If you think you have taken too much of this medicine contact a poison control center or emergency room at once. NOTE: This medicine is only for you. Do not share this medicine with others. What if I miss a dose? It is important not to miss your dose. Call your doctor or health care professional if you miss a dose. What may interact with this medicine? This medicine may interact with the following medications: -medicines that may cause a release of  neutrophils, such as lithium This list may not describe all possible interactions. Give your health care provider a list of all the medicines, herbs, non-prescription drugs, or dietary supplements you use. Also tell them if you smoke, drink alcohol, or use illegal drugs. Some items may interact with your medicine. What should I watch for while using this medicine? You may need blood work done while you are taking this medicine. What side effects may I notice from receiving this medicine? Side effects that you should report to your doctor or health care professional as soon as possible: -allergic reactions like skin rash, itching or hives, swelling of the face, lips, or tongue -shortness of breath or breathing problems -fever -pain, redness, or irritation at site where injected -pinpoint red spots on the skin -stomach or side pain, or pain at the shoulder -swelling -tiredness -trouble passing urine Side effects that usually do not require medical attention (Report these to your doctor or health care professional if they continue or are bothersome.): -bone pain -muscle pain This list may not describe all possible side effects. Call your doctor for medical advice about side effects. You may report side effects to FDA at 1-800-FDA-1088. Where should I keep my medicine? Keep out of the reach of children. Store in a refrigerator between 2 and 8 degrees C (36 and 46 degrees F). Keep in carton to protect from light. Throw away this medicine if it is left out of the refrigerator for more than 5 consecutive days. Throw away any unused medicine after the expiration date. NOTE: This sheet is a summary. It may not cover all possible information. If you have questions about this medicine, talk to your doctor, pharmacist, or health care provider.    2016, Elsevier/Gold Standard. (2014-01-04 11:52:29)  

## 2016-05-19 ENCOUNTER — Ambulatory Visit (HOSPITAL_BASED_OUTPATIENT_CLINIC_OR_DEPARTMENT_OTHER): Payer: Medicare Other

## 2016-05-19 VITALS — BP 118/60 | HR 95 | Temp 98.4°F | Resp 22

## 2016-05-19 DIAGNOSIS — Z5189 Encounter for other specified aftercare: Secondary | ICD-10-CM

## 2016-05-19 DIAGNOSIS — C541 Malignant neoplasm of endometrium: Secondary | ICD-10-CM

## 2016-05-19 MED ORDER — TBO-FILGRASTIM 480 MCG/0.8ML ~~LOC~~ SOSY
480.0000 ug | PREFILLED_SYRINGE | Freq: Once | SUBCUTANEOUS | Status: AC
  Administered 2016-05-19: 480 ug via SUBCUTANEOUS
  Filled 2016-05-19: qty 0.8

## 2016-05-19 NOTE — Patient Instructions (Signed)

## 2016-05-20 ENCOUNTER — Other Ambulatory Visit: Payer: Self-pay | Admitting: Pharmacist

## 2016-05-20 ENCOUNTER — Other Ambulatory Visit (HOSPITAL_BASED_OUTPATIENT_CLINIC_OR_DEPARTMENT_OTHER): Payer: Medicare Other

## 2016-05-20 ENCOUNTER — Ambulatory Visit: Payer: PRIVATE HEALTH INSURANCE

## 2016-05-20 DIAGNOSIS — Z95828 Presence of other vascular implants and grafts: Secondary | ICD-10-CM

## 2016-05-20 DIAGNOSIS — C541 Malignant neoplasm of endometrium: Secondary | ICD-10-CM | POA: Diagnosis present

## 2016-05-20 LAB — CBC WITH DIFFERENTIAL/PLATELET
BASO%: 0.2 % (ref 0.0–2.0)
BASOS ABS: 0 10*3/uL (ref 0.0–0.1)
EOS%: 0.3 % (ref 0.0–7.0)
Eosinophils Absolute: 0.1 10*3/uL (ref 0.0–0.5)
HEMATOCRIT: 35.9 % (ref 34.8–46.6)
HEMOGLOBIN: 11.4 g/dL — AB (ref 11.6–15.9)
LYMPH#: 2.2 10*3/uL (ref 0.9–3.3)
LYMPH%: 7.3 % — ABNORMAL LOW (ref 14.0–49.7)
MCH: 29.4 pg (ref 25.1–34.0)
MCHC: 31.7 g/dL (ref 31.5–36.0)
MCV: 92.5 fL (ref 79.5–101.0)
MONO#: 1.7 10*3/uL — ABNORMAL HIGH (ref 0.1–0.9)
MONO%: 5.6 % (ref 0.0–14.0)
NEUT#: 25.7 10*3/uL — ABNORMAL HIGH (ref 1.5–6.5)
NEUT%: 86.6 % — ABNORMAL HIGH (ref 38.4–76.8)
Platelets: 188 10*3/uL (ref 145–400)
RBC: 3.88 10*6/uL (ref 3.70–5.45)
RDW: 15.2 % — AB (ref 11.2–14.5)
WBC: 29.6 10*3/uL — ABNORMAL HIGH (ref 3.9–10.3)

## 2016-05-20 MED ORDER — SODIUM CHLORIDE 0.9% FLUSH
10.0000 mL | INTRAVENOUS | Status: DC | PRN
  Administered 2016-05-20: 10 mL via INTRAVENOUS
  Filled 2016-05-20: qty 10

## 2016-05-20 MED ORDER — HEPARIN SOD (PORK) LOCK FLUSH 100 UNIT/ML IV SOLN
500.0000 [IU] | Freq: Once | INTRAVENOUS | Status: AC
  Administered 2016-05-20: 500 [IU] via INTRAVENOUS
  Filled 2016-05-20: qty 5

## 2016-05-20 NOTE — Patient Instructions (Signed)

## 2016-05-20 NOTE — Progress Notes (Signed)
Patient in for possible granix injection if ANC less than 1.5 per MD/ ANC today was 25.7- no injection needed. Informed pt who denies any questions or concerns at this time.

## 2016-05-22 ENCOUNTER — Other Ambulatory Visit: Payer: Self-pay | Admitting: Oncology

## 2016-05-23 ENCOUNTER — Other Ambulatory Visit: Payer: Self-pay | Admitting: Oncology

## 2016-05-25 ENCOUNTER — Ambulatory Visit (HOSPITAL_BASED_OUTPATIENT_CLINIC_OR_DEPARTMENT_OTHER): Payer: Medicare Other

## 2016-05-25 ENCOUNTER — Ambulatory Visit: Payer: Medicare Other

## 2016-05-25 ENCOUNTER — Encounter: Payer: Self-pay | Admitting: Oncology

## 2016-05-25 ENCOUNTER — Ambulatory Visit (HOSPITAL_BASED_OUTPATIENT_CLINIC_OR_DEPARTMENT_OTHER): Payer: Medicare Other | Admitting: Oncology

## 2016-05-25 ENCOUNTER — Other Ambulatory Visit (HOSPITAL_BASED_OUTPATIENT_CLINIC_OR_DEPARTMENT_OTHER): Payer: Medicare Other

## 2016-05-25 VITALS — BP 136/85 | HR 100 | Temp 98.2°F | Resp 21 | Ht 62.0 in | Wt 233.2 lb

## 2016-05-25 DIAGNOSIS — R7303 Prediabetes: Secondary | ICD-10-CM

## 2016-05-25 DIAGNOSIS — D701 Agranulocytosis secondary to cancer chemotherapy: Secondary | ICD-10-CM

## 2016-05-25 DIAGNOSIS — T451X5A Adverse effect of antineoplastic and immunosuppressive drugs, initial encounter: Secondary | ICD-10-CM

## 2016-05-25 DIAGNOSIS — Z5111 Encounter for antineoplastic chemotherapy: Secondary | ICD-10-CM

## 2016-05-25 DIAGNOSIS — Z86718 Personal history of other venous thrombosis and embolism: Secondary | ICD-10-CM

## 2016-05-25 DIAGNOSIS — C541 Malignant neoplasm of endometrium: Secondary | ICD-10-CM

## 2016-05-25 DIAGNOSIS — Z95828 Presence of other vascular implants and grafts: Secondary | ICD-10-CM

## 2016-05-25 DIAGNOSIS — K219 Gastro-esophageal reflux disease without esophagitis: Secondary | ICD-10-CM

## 2016-05-25 DIAGNOSIS — I10 Essential (primary) hypertension: Secondary | ICD-10-CM | POA: Diagnosis not present

## 2016-05-25 LAB — COMPREHENSIVE METABOLIC PANEL
ALT: 17 U/L (ref 0–55)
ANION GAP: 10 meq/L (ref 3–11)
AST: 18 U/L (ref 5–34)
Albumin: 3.4 g/dL — ABNORMAL LOW (ref 3.5–5.0)
Alkaline Phosphatase: 121 U/L (ref 40–150)
BUN: 20.1 mg/dL (ref 7.0–26.0)
CHLORIDE: 104 meq/L (ref 98–109)
CO2: 24 meq/L (ref 22–29)
CREATININE: 0.9 mg/dL (ref 0.6–1.1)
Calcium: 10.2 mg/dL (ref 8.4–10.4)
EGFR: 58 mL/min/{1.73_m2} — ABNORMAL LOW (ref >90)
Glucose: 223 mg/dl — ABNORMAL HIGH (ref 70–140)
Potassium: 4.8 mEq/L (ref 3.5–5.1)
Sodium: 137 mEq/L (ref 136–145)
Total Bilirubin: 0.4 mg/dL (ref 0.20–1.20)
Total Protein: 6.7 g/dL (ref 6.4–8.3)

## 2016-05-25 LAB — CBC WITH DIFFERENTIAL/PLATELET
BASO%: 0.2 % (ref 0.0–2.0)
Basophils Absolute: 0 10*3/uL (ref 0.0–0.1)
EOS%: 0 % (ref 0.0–7.0)
Eosinophils Absolute: 0 10*3/uL (ref 0.0–0.5)
HCT: 37.1 % (ref 34.8–46.6)
HGB: 12.2 g/dL (ref 11.6–15.9)
LYMPH%: 13 % — AB (ref 14.0–49.7)
MCH: 29.9 pg (ref 25.1–34.0)
MCHC: 32.9 g/dL (ref 31.5–36.0)
MCV: 90.9 fL (ref 79.5–101.0)
MONO#: 0.1 10*3/uL (ref 0.1–0.9)
MONO%: 1.4 % (ref 0.0–14.0)
NEUT#: 4.3 10*3/uL (ref 1.5–6.5)
NEUT%: 85.4 % — AB (ref 38.4–76.8)
PLATELETS: 152 10*3/uL (ref 145–400)
RBC: 4.08 10*6/uL (ref 3.70–5.45)
RDW: 15.2 % — ABNORMAL HIGH (ref 11.2–14.5)
WBC: 5.1 10*3/uL (ref 3.9–10.3)
lymph#: 0.7 10*3/uL — ABNORMAL LOW (ref 0.9–3.3)

## 2016-05-25 MED ORDER — PACLITAXEL CHEMO INJECTION 300 MG/50ML
80.0000 mg/m2 | Freq: Once | INTRAVENOUS | Status: AC
  Administered 2016-05-25: 174 mg via INTRAVENOUS
  Filled 2016-05-25: qty 29

## 2016-05-25 MED ORDER — SODIUM CHLORIDE 0.9 % IV SOLN
Freq: Once | INTRAVENOUS | Status: AC
  Administered 2016-05-25: 16:00:00 via INTRAVENOUS
  Filled 2016-05-25: qty 5

## 2016-05-25 MED ORDER — FAMOTIDINE IN NACL 20-0.9 MG/50ML-% IV SOLN
20.0000 mg | Freq: Once | INTRAVENOUS | Status: AC
  Administered 2016-05-25: 20 mg via INTRAVENOUS

## 2016-05-25 MED ORDER — SODIUM CHLORIDE 0.9 % IV SOLN
Freq: Once | INTRAVENOUS | Status: AC
  Administered 2016-05-25: 16:00:00 via INTRAVENOUS

## 2016-05-25 MED ORDER — SODIUM CHLORIDE 0.9 % IV SOLN
Freq: Once | INTRAVENOUS | Status: AC
  Administered 2016-05-25: 17:00:00 via INTRAVENOUS
  Filled 2016-05-25: qty 4

## 2016-05-25 MED ORDER — SODIUM CHLORIDE 0.9 % IV SOLN
506.0000 mg | Freq: Once | INTRAVENOUS | Status: AC
  Administered 2016-05-25: 510 mg via INTRAVENOUS
  Filled 2016-05-25: qty 51

## 2016-05-25 MED ORDER — FAMOTIDINE IN NACL 20-0.9 MG/50ML-% IV SOLN
INTRAVENOUS | Status: AC
  Filled 2016-05-25: qty 50

## 2016-05-25 MED ORDER — DIPHENHYDRAMINE HCL 50 MG/ML IJ SOLN
INTRAMUSCULAR | Status: AC
  Filled 2016-05-25: qty 1

## 2016-05-25 MED ORDER — SODIUM CHLORIDE 0.9% FLUSH
10.0000 mL | INTRAVENOUS | Status: DC | PRN
  Administered 2016-05-25: 10 mL
  Filled 2016-05-25: qty 10

## 2016-05-25 MED ORDER — HEPARIN SOD (PORK) LOCK FLUSH 100 UNIT/ML IV SOLN
500.0000 [IU] | Freq: Once | INTRAVENOUS | Status: AC | PRN
  Administered 2016-05-25: 500 [IU]
  Filled 2016-05-25: qty 5

## 2016-05-25 MED ORDER — INSULIN REGULAR HUMAN 100 UNIT/ML IJ SOLN
2.0000 [IU] | Freq: Once | INTRAMUSCULAR | Status: AC | PRN
  Administered 2016-05-25: 6 [IU] via SUBCUTANEOUS
  Filled 2016-05-25: qty 0.08

## 2016-05-25 MED ORDER — DIPHENHYDRAMINE HCL 50 MG/ML IJ SOLN
25.0000 mg | Freq: Once | INTRAMUSCULAR | Status: AC
  Administered 2016-05-25: 25 mg via INTRAVENOUS

## 2016-05-25 NOTE — Patient Instructions (Signed)

## 2016-05-25 NOTE — Patient Instructions (Signed)
[facility] Discharge Instructions for Patients Receiving Chemotherapy  Today you received the following chemotherapy agents Taxol and Carboplatin.  To help prevent nausea and vomiting after your treatment, we encourage you to take your nausea medication as prescribed.   If you develop nausea and vomiting that is not controlled by your nausea medication, call the clinic.   BELOW ARE SYMPTOMS THAT SHOULD BE REPORTED IMMEDIATELY:  *FEVER GREATER THAN 100.5 F  *CHILLS WITH OR WITHOUT FEVER  NAUSEA AND VOMITING THAT IS NOT CONTROLLED WITH YOUR NAUSEA MEDICATION  *UNUSUAL SHORTNESS OF BREATH  *UNUSUAL BRUISING OR BLEEDING  TENDERNESS IN MOUTH AND THROAT WITH OR WITHOUT PRESENCE OF ULCERS  *URINARY PROBLEMS  *BOWEL PROBLEMS  UNUSUAL RASH Items with * indicate a potential emergency and should be followed up as soon as possible.  Feel free to call the clinic you have any questions or concerns. The clinic phone number is [phone].  Please show the CHEMO ALERT CARD at check-in to the Emergency Department and triage nurse.   

## 2016-05-25 NOTE — Progress Notes (Signed)
OFFICE PROGRESS NOTE   May 25, 2016   Physicians: Dorann Ou, Maury Dus, MD; V.Mody, F.Alusio, R.Buccini, Alphonsa Overall  INTERVAL HISTORY:  Patient is seen, together with friend, as she continues adjuvant chemotherapy for IIIC1 high grade serous endometrial carcinoma, due day 1 cycle 2 dose dense carbo taxol today. Day 15 cycle 1 was omitted with neutropenia, ANC 0.8, with recovery after 2 granix. Plan is to add granix days 2,9,16 for subsequent cycles. Plan to consider HDR after completion of chemo if doing well.  Patient had PAC placed by Dr Kathlene Cote on 05-15-16 without difficulty, and is delighted not to need peripheral access.. She has lost most of hair, which was upsetting for her. She has had no nausea and no taste changes. Bowels are moving well daily with <= daily colace. She denies peripheral neuropathy, abdominal or pelvic pain, SOB, LE swelling, any bleeding, any fever or symptoms of infection. Feels a little jittery with the premed steroids She enjoyed a  bus day trip this weekend. Remainder of 10 point Review of Systems negative.   PAC placed by IR 05-15-16 No genetics testing.  Patient has cruise planned Oct 7-14   ONCOLOGIC HISTORY Patient had one episode of heavier vaginal bleeding on 01-28-16, then two later episodes of spotting. She saw Dr Benjie Karvonen, with [place] and D&C 03-04-16 demonstrating high grade serous endometrial carcinoma ([id number]). She was referred to Dr Denman George, with CT CAP 03-24-16 with 20 mm endometrium, 2 mildly enlarged left external iliac nodes and a mildly enlarged left para aortic node, but without evidence of other distant disease. She had robotic total hysterectomy BSO and sentinel node evaluation by Dr Denman George on 03-26-16, with pathology [phone]) high grade serous carcinoma of endometrium extending into outer half of myometrium, + LVSI, involved left obturator sentinel node with 3 other sentinel nodes negative, ovaries and tubes not involved. Post operative course  was not remarkable. She completed 14 days of prophylactic lovenox postoperatively. SHe saw Dr Denman George in follow up on 04-20-16, doing well from surgery, recommendation for 6 cycles of carboplatin taxol and consideration of HDR at completion of chemo if doing well. Day 1 cycle 1 dose dense carbo taxol was given 05-04-16. She was neutropenic day 15 cycle 1, granix added.   Objective:  Vital signs in last 24 hours:  BP 136/85 (BP Location: Left Arm, Patient Position: Sitting)   Pulse 100   Temp 98.2 F (36.8 C) (Oral)   Resp (!) 21   Ht '5\' 2"'  (1.575 m)   Wt 233 lb 3.2 oz (105.8 kg)   SpO2 94%   BMI 42.65 kg/m  Weight up 1 lb Alert, oriented and appropriate. Ambulatory without assistance difficulty.  Partial alopecia  HEENT:PERRL, sclerae not icteric. Oral mucosa moist without lesions, posterior pharynx clear.  Neck supple. No JVD.  Lymphatics:no cervical,supraclavicular adenopathy Resp: clear to auscultation bilaterally  Cardio: regular rate and rhythm. No gallop. GI: abdomen obese, soft, nontender, not distended, cannot appreciate mass or organomegaly. Normally active bowel sounds. Surgical incisions not remarkable. Musculoskeletal/ Extremities: without pitting edema, cords, tenderness Neuro: no peripheral neuropathy. Otherwise nonfocal. PSYCH appropriate mood and affect Skin without rash, ecchymosis, petechiae Portacath-without erythema or tenderness, slightly mobile but access indicators easily palpable, no bruising.  Lab Results:  Results for orders placed or performed in visit on 05/25/16  CBC with Differential  Result Value Ref Range   WBC 5.1 3.9 - 10.3 10e3/uL   NEUT# 4.3 1.5 - 6.5 10e3/uL   HGB 12.2 11.6 - 15.9  g/dL   HCT 37.1 34.8 - 46.6 %   Platelets 152 145 - 400 10e3/uL   MCV 90.9 79.5 - 101.0 fL   MCH 29.9 25.1 - 34.0 pg   MCHC 32.9 31.5 - 36.0 g/dL   RBC 4.08 3.70 - 5.45 10e6/uL   RDW 15.2 (H) 11.2 - 14.5 %   lymph# 0.7 (L) 0.9 - 3.3 10e3/uL   MONO# 0.1 0.1 - 0.9  10e3/uL   Eosinophils Absolute 0.0 0.0 - 0.5 10e3/uL   Basophils Absolute 0.0 0.0 - 0.1 10e3/uL   NEUT% 85.4 (H) 38.4 - 76.8 %   LYMPH% 13.0 (L) 14.0 - 49.7 %   MONO% 1.4 0.0 - 14.0 %   EOS% 0.0 0.0 - 7.0 %   BASO% 0.2 0.0 - 2.0 %  Comprehensive metabolic panel  Result Value Ref Range   Sodium 137 136 - 145 mEq/L   Potassium 4.8 3.5 - 5.1 mEq/L   Chloride 104 98 - 109 mEq/L   CO2 24 22 - 29 mEq/L   Glucose 223 (H) 70 - 140 mg/dl   BUN 20.1 7.0 - 26.0 mg/dL   Creatinine 0.9 0.6 - 1.1 mg/dL   Total Bilirubin 0.40 0.20 - 1.20 mg/dL   Alkaline Phosphatase 121 40 - 150 U/L   AST 18 5 - 34 U/L   ALT 17 0 - 55 U/L   Total Protein 6.7 6.4 - 8.3 g/dL   Albumin 3.4 (L) 3.5 - 5.0 g/dL   Calcium 10.2 8.4 - 10.4 mg/dL   Anion Gap 10 3 - 11 mEq/L   EGFR 58 (L) >90 ml/min/1.73 m2     Studies/Results:  No results found.  Medications: I have reviewed the patient's current medications. Refill decadron  DISCUSSION Timing of full course of chemo discussed, including one week skipped for cruise.  Discussed q 3 week dosing, which I have told her is likely to cause significantly more uncomfortable side effects for 7-10 days after each treatment. By completion of discussion she is in agreement with continuing dose dense therapy as planned. Discussed alopecia, steroids, other interval history.   Assessment/Plan:  1. IIIC1 high grade serous endometrial carcinoma: post robotic assisted total hysterectomy BSO and sentinel node evaluation by Dr Denman George 03-26-16. Plan 6 cycles of carboplatin taxol as adjuvant therapy, dose dense weekly regimen begun 05-04-16. She will have day 1 cycle 2 taxol today. Granix added days 2,9,16. Day 8 cycle 2 will be 06-02-16 due to Labor day holiday, granix on 9-6. Will treat thru day 15 cycle 3 on 06-29-16, then delay start of cycle 4 until 07-13-16 due to her trip.  Plan consideration of HDR after completion of chemo if doing well 2.inadequate peripheral IV access, now with  PAC 3.history LE DVT 20 years ago. Completed prophylactic lovenox following gyn onc surgery postoperatively, no findings of concern on exam now.  4.borderline DM. Expect transient elevation of blood sugars with steroids for taxol,  SS insulin in chemo orders.  5.degenerative disease and scoliosis LS with chronic back pain. Known to Dr Maureen Ralphs, knee replacements 6.obstructive sleep apnea on CPAP 7.urinary frequency with related sleep disturbance, long-standing. Urine culture negative 05-04-16 8. Post lap chole and umbilical hernia repair. HH. GERD controlled on prilosce 9.HTN controlled. Elevated lipids. CAD by CT. Atherosclerosis thoracic and abdominal aorta on CT 10.hypothyroid on synthroid by Dr Alyson Ingles 11.up to date colonoscopy, no polyps per patient 12.morbid obesity   All questions answered and she knows to call if any concerns prior to  scheduled appointments. Chemo and granix orders confirmed, scheduling addended. Time spent 25 min including >50% counseling and coordination of care. Route PCP   Evlyn Clines, MD   05/25/2016, 8:13 PM

## 2016-05-26 ENCOUNTER — Ambulatory Visit (HOSPITAL_BASED_OUTPATIENT_CLINIC_OR_DEPARTMENT_OTHER): Payer: Medicare Other

## 2016-05-26 VITALS — BP 106/62 | HR 87 | Temp 98.3°F | Resp 18

## 2016-05-26 DIAGNOSIS — C541 Malignant neoplasm of endometrium: Secondary | ICD-10-CM | POA: Diagnosis present

## 2016-05-26 DIAGNOSIS — Z95828 Presence of other vascular implants and grafts: Secondary | ICD-10-CM | POA: Insufficient documentation

## 2016-05-26 DIAGNOSIS — Z5189 Encounter for other specified aftercare: Secondary | ICD-10-CM | POA: Diagnosis not present

## 2016-05-26 DIAGNOSIS — Z5111 Encounter for antineoplastic chemotherapy: Secondary | ICD-10-CM | POA: Insufficient documentation

## 2016-05-26 DIAGNOSIS — D701 Agranulocytosis secondary to cancer chemotherapy: Secondary | ICD-10-CM | POA: Insufficient documentation

## 2016-05-26 DIAGNOSIS — T451X5A Adverse effect of antineoplastic and immunosuppressive drugs, initial encounter: Secondary | ICD-10-CM

## 2016-05-26 MED ORDER — DEXAMETHASONE 4 MG PO TABS: ORAL_TABLET | ORAL | 1 refills | Status: DC

## 2016-05-26 MED ORDER — TBO-FILGRASTIM 480 MCG/0.8ML ~~LOC~~ SOSY
480.0000 ug | PREFILLED_SYRINGE | Freq: Once | SUBCUTANEOUS | Status: AC
  Administered 2016-05-26: 480 ug via SUBCUTANEOUS
  Filled 2016-05-26: qty 0.8

## 2016-05-27 ENCOUNTER — Telehealth: Payer: Self-pay | Admitting: Oncology

## 2016-05-27 NOTE — Telephone Encounter (Signed)
APPT SCHD MAILED PER PATIENT REQUEST. 05/27/16

## 2016-06-02 ENCOUNTER — Ambulatory Visit (HOSPITAL_BASED_OUTPATIENT_CLINIC_OR_DEPARTMENT_OTHER): Payer: Medicare Other

## 2016-06-02 ENCOUNTER — Ambulatory Visit: Payer: Medicare Other

## 2016-06-02 ENCOUNTER — Other Ambulatory Visit (HOSPITAL_BASED_OUTPATIENT_CLINIC_OR_DEPARTMENT_OTHER): Payer: Medicare Other

## 2016-06-02 VITALS — BP 128/52 | HR 98 | Temp 98.0°F | Resp 20

## 2016-06-02 DIAGNOSIS — R739 Hyperglycemia, unspecified: Secondary | ICD-10-CM | POA: Diagnosis not present

## 2016-06-02 DIAGNOSIS — C541 Malignant neoplasm of endometrium: Secondary | ICD-10-CM | POA: Diagnosis present

## 2016-06-02 DIAGNOSIS — Z5111 Encounter for antineoplastic chemotherapy: Secondary | ICD-10-CM | POA: Diagnosis not present

## 2016-06-02 DIAGNOSIS — E119 Type 2 diabetes mellitus without complications: Secondary | ICD-10-CM

## 2016-06-02 DIAGNOSIS — Z794 Long term (current) use of insulin: Secondary | ICD-10-CM

## 2016-06-02 DIAGNOSIS — Z95828 Presence of other vascular implants and grafts: Secondary | ICD-10-CM

## 2016-06-02 DIAGNOSIS — IMO0001 Reserved for inherently not codable concepts without codable children: Secondary | ICD-10-CM

## 2016-06-02 LAB — CBC WITH DIFFERENTIAL/PLATELET
BASO%: 0.1 % (ref 0.0–2.0)
Basophils Absolute: 0 10*3/uL (ref 0.0–0.1)
EOS ABS: 0 10*3/uL (ref 0.0–0.5)
EOS%: 0 % (ref 0.0–7.0)
HCT: 34.3 % — ABNORMAL LOW (ref 34.8–46.6)
HGB: 11.1 g/dL — ABNORMAL LOW (ref 11.6–15.9)
LYMPH%: 8.8 % — AB (ref 14.0–49.7)
MCH: 29.9 pg (ref 25.1–34.0)
MCHC: 32.4 g/dL (ref 31.5–36.0)
MCV: 92.3 fL (ref 79.5–101.0)
MONO#: 0.1 10*3/uL (ref 0.1–0.9)
MONO%: 2.3 % (ref 0.0–14.0)
NEUT%: 88.8 % — AB (ref 38.4–76.8)
NEUTROS ABS: 3.6 10*3/uL (ref 1.5–6.5)
PLATELETS: 266 10*3/uL (ref 145–400)
RBC: 3.72 10*6/uL (ref 3.70–5.45)
RDW: 16.4 % — ABNORMAL HIGH (ref 11.2–14.5)
WBC: 4 10*3/uL (ref 3.9–10.3)
lymph#: 0.4 10*3/uL — ABNORMAL LOW (ref 0.9–3.3)

## 2016-06-02 LAB — COMPREHENSIVE METABOLIC PANEL
ALT: 18 U/L (ref 0–55)
ANION GAP: 11 meq/L (ref 3–11)
AST: 18 U/L (ref 5–34)
Albumin: 3.4 g/dL — ABNORMAL LOW (ref 3.5–5.0)
Alkaline Phosphatase: 101 U/L (ref 40–150)
BILIRUBIN TOTAL: 0.59 mg/dL (ref 0.20–1.20)
BUN: 34.5 mg/dL — ABNORMAL HIGH (ref 7.0–26.0)
CHLORIDE: 105 meq/L (ref 98–109)
CO2: 22 meq/L (ref 22–29)
Calcium: 10.6 mg/dL — ABNORMAL HIGH (ref 8.4–10.4)
Creatinine: 1.2 mg/dL — ABNORMAL HIGH (ref 0.6–1.1)
EGFR: 42 mL/min/{1.73_m2} — AB (ref >90)
GLUCOSE: 202 mg/dL — AB (ref 70–140)
Potassium: 5 mEq/L (ref 3.5–5.1)
SODIUM: 137 meq/L (ref 136–145)
TOTAL PROTEIN: 6.6 g/dL (ref 6.4–8.3)

## 2016-06-02 MED ORDER — DIPHENHYDRAMINE HCL 50 MG/ML IJ SOLN
25.0000 mg | Freq: Once | INTRAMUSCULAR | Status: AC
  Administered 2016-06-02: 25 mg via INTRAVENOUS

## 2016-06-02 MED ORDER — INSULIN REGULAR HUMAN 100 UNIT/ML IJ SOLN
4.0000 [IU] | Freq: Once | INTRAMUSCULAR | Status: AC | PRN
  Administered 2016-06-02: 4 [IU] via SUBCUTANEOUS
  Filled 2016-06-02: qty 0.04

## 2016-06-02 MED ORDER — SODIUM CHLORIDE 0.9 % IV SOLN
80.0000 mg/m2 | Freq: Once | INTRAVENOUS | Status: AC
  Administered 2016-06-02: 174 mg via INTRAVENOUS
  Filled 2016-06-02: qty 29

## 2016-06-02 MED ORDER — HEPARIN SOD (PORK) LOCK FLUSH 100 UNIT/ML IV SOLN
500.0000 [IU] | Freq: Once | INTRAVENOUS | Status: AC | PRN
  Administered 2016-06-02: 500 [IU] via INTRAVENOUS
  Filled 2016-06-02: qty 5

## 2016-06-02 MED ORDER — FAMOTIDINE IN NACL 20-0.9 MG/50ML-% IV SOLN
20.0000 mg | Freq: Once | INTRAVENOUS | Status: AC
  Administered 2016-06-02: 20 mg via INTRAVENOUS

## 2016-06-02 MED ORDER — DIPHENHYDRAMINE HCL 50 MG/ML IJ SOLN
INTRAMUSCULAR | Status: AC
  Filled 2016-06-02: qty 1

## 2016-06-02 MED ORDER — SODIUM CHLORIDE 0.9 % IV SOLN
Freq: Once | INTRAVENOUS | Status: AC
  Administered 2016-06-02: 15:00:00 via INTRAVENOUS

## 2016-06-02 MED ORDER — FAMOTIDINE IN NACL 20-0.9 MG/50ML-% IV SOLN
INTRAVENOUS | Status: AC
  Filled 2016-06-02: qty 50

## 2016-06-02 MED ORDER — SODIUM CHLORIDE 0.9 % IJ SOLN
10.0000 mL | INTRAMUSCULAR | Status: DC | PRN
  Administered 2016-06-02: 10 mL via INTRAVENOUS
  Filled 2016-06-02: qty 10

## 2016-06-02 MED ORDER — SODIUM CHLORIDE 0.9 % IV SOLN
Freq: Once | INTRAVENOUS | Status: AC
  Administered 2016-06-02: 15:00:00 via INTRAVENOUS
  Filled 2016-06-02: qty 4

## 2016-06-02 NOTE — Patient Instructions (Signed)
[facility] Discharge Instructions for Patients Receiving Chemotherapy  Today you received the following chemotherapy agents Taxol   To help prevent nausea and vomiting after your treatment, we encourage you to take your nausea medication as directed.   If you develop nausea and vomiting that is not controlled by your nausea medication, call the clinic.   BELOW ARE SYMPTOMS THAT SHOULD BE REPORTED IMMEDIATELY:  *FEVER GREATER THAN 100.5 F  *CHILLS WITH OR WITHOUT FEVER  NAUSEA AND VOMITING THAT IS NOT CONTROLLED WITH YOUR NAUSEA MEDICATION  *UNUSUAL SHORTNESS OF BREATH  *UNUSUAL BRUISING OR BLEEDING  TENDERNESS IN MOUTH AND THROAT WITH OR WITHOUT PRESENCE OF ULCERS  *URINARY PROBLEMS  *BOWEL PROBLEMS  UNUSUAL RASH Items with * indicate a potential emergency and should be followed up as soon as possible.  Feel free to call the clinic you have any questions or concerns. The clinic phone number is [phone].  Please show the CHEMO ALERT CARD at check-in to the Emergency Department and triage nurse.   

## 2016-06-03 ENCOUNTER — Telehealth: Payer: Self-pay

## 2016-06-03 ENCOUNTER — Other Ambulatory Visit: Payer: Self-pay | Admitting: Oncology

## 2016-06-03 ENCOUNTER — Ambulatory Visit (HOSPITAL_BASED_OUTPATIENT_CLINIC_OR_DEPARTMENT_OTHER): Payer: Medicare Other

## 2016-06-03 VITALS — BP 100/57 | HR 90 | Temp 98.2°F | Resp 22

## 2016-06-03 DIAGNOSIS — C541 Malignant neoplasm of endometrium: Secondary | ICD-10-CM | POA: Diagnosis present

## 2016-06-03 DIAGNOSIS — Z5189 Encounter for other specified aftercare: Secondary | ICD-10-CM | POA: Diagnosis not present

## 2016-06-03 MED ORDER — TBO-FILGRASTIM 480 MCG/0.8ML ~~LOC~~ SOSY
480.0000 ug | PREFILLED_SYRINGE | Freq: Once | SUBCUTANEOUS | Status: AC
  Administered 2016-06-03: 480 ug via SUBCUTANEOUS
  Filled 2016-06-03: qty 0.8

## 2016-06-03 NOTE — Telephone Encounter (Signed)
LM for patient to call back to review results as noted below by Dr. Marko Plume.

## 2016-06-03 NOTE — Telephone Encounter (Signed)
Discussed the results of Melinda Kerr's kidney function as noted below by Dr. Marko Plume. Melinda Kerr states that she takes in two 32 oz glasses of fluid a day and two additional 8 oz glasses with her pills.

## 2016-06-03 NOTE — Patient Instructions (Signed)

## 2016-06-03 NOTE — Telephone Encounter (Signed)
Melinda Marion Downer, MD  Baruch Merl, RN Cc: Janace Hoard, RN        Labs seen and need follow up: day 8 taxol on 9-5, creatinine up some from her baseline. Please be sure she is drinking fluids well

## 2016-06-08 ENCOUNTER — Ambulatory Visit (HOSPITAL_BASED_OUTPATIENT_CLINIC_OR_DEPARTMENT_OTHER): Payer: Medicare Other

## 2016-06-08 ENCOUNTER — Ambulatory Visit: Payer: Medicare Other

## 2016-06-08 ENCOUNTER — Other Ambulatory Visit (HOSPITAL_BASED_OUTPATIENT_CLINIC_OR_DEPARTMENT_OTHER): Payer: Medicare Other

## 2016-06-08 VITALS — BP 143/64 | HR 92 | Temp 98.2°F | Resp 20

## 2016-06-08 DIAGNOSIS — Z0000 Encounter for general adult medical examination without abnormal findings: Secondary | ICD-10-CM

## 2016-06-08 DIAGNOSIS — Z5111 Encounter for antineoplastic chemotherapy: Secondary | ICD-10-CM

## 2016-06-08 DIAGNOSIS — C541 Malignant neoplasm of endometrium: Secondary | ICD-10-CM

## 2016-06-08 DIAGNOSIS — R739 Hyperglycemia, unspecified: Secondary | ICD-10-CM | POA: Diagnosis not present

## 2016-06-08 DIAGNOSIS — Z95828 Presence of other vascular implants and grafts: Secondary | ICD-10-CM

## 2016-06-08 LAB — COMPREHENSIVE METABOLIC PANEL
ALT: 20 U/L (ref 0–55)
ANION GAP: 10 meq/L (ref 3–11)
AST: 17 U/L (ref 5–34)
Albumin: 3.1 g/dL — ABNORMAL LOW (ref 3.5–5.0)
Alkaline Phosphatase: 111 U/L (ref 40–150)
BUN: 21.1 mg/dL (ref 7.0–26.0)
CALCIUM: 10.1 mg/dL (ref 8.4–10.4)
CHLORIDE: 103 meq/L (ref 98–109)
CO2: 22 mEq/L (ref 22–29)
CREATININE: 0.8 mg/dL (ref 0.6–1.1)
EGFR: 68 mL/min/{1.73_m2} — ABNORMAL LOW (ref >90)
Glucose: 144 mg/dl — ABNORMAL HIGH (ref 70–140)
Potassium: 4.9 mEq/L (ref 3.5–5.1)
Sodium: 135 mEq/L — ABNORMAL LOW (ref 136–145)
Total Bilirubin: 0.38 mg/dL (ref 0.20–1.20)
Total Protein: 6.2 g/dL — ABNORMAL LOW (ref 6.4–8.3)

## 2016-06-08 LAB — CBC WITH DIFFERENTIAL/PLATELET
BASO%: 0.2 % (ref 0.0–2.0)
BASOS ABS: 0 10*3/uL (ref 0.0–0.1)
EOS ABS: 0 10*3/uL (ref 0.0–0.5)
EOS%: 0 % (ref 0.0–7.0)
HEMATOCRIT: 31.8 % — AB (ref 34.8–46.6)
HGB: 10.3 g/dL — ABNORMAL LOW (ref 11.6–15.9)
LYMPH#: 0.4 10*3/uL — AB (ref 0.9–3.3)
LYMPH%: 17.4 % (ref 14.0–49.7)
MCH: 29.9 pg (ref 25.1–34.0)
MCHC: 32.3 g/dL (ref 31.5–36.0)
MCV: 92.6 fL (ref 79.5–101.0)
MONO#: 0.1 10*3/uL (ref 0.1–0.9)
MONO%: 3.5 % (ref 0.0–14.0)
NEUT#: 1.6 10*3/uL (ref 1.5–6.5)
NEUT%: 78.9 % — AB (ref 38.4–76.8)
PLATELETS: 209 10*3/uL (ref 145–400)
RBC: 3.44 10*6/uL — ABNORMAL LOW (ref 3.70–5.45)
RDW: 16.6 % — ABNORMAL HIGH (ref 11.2–14.5)
WBC: 2 10*3/uL — ABNORMAL LOW (ref 3.9–10.3)

## 2016-06-08 MED ORDER — HEPARIN SOD (PORK) LOCK FLUSH 100 UNIT/ML IV SOLN
500.0000 [IU] | Freq: Once | INTRAVENOUS | Status: AC
  Administered 2016-06-08: 500 [IU] via INTRAVENOUS
  Filled 2016-06-08: qty 5

## 2016-06-08 MED ORDER — FAMOTIDINE IN NACL 20-0.9 MG/50ML-% IV SOLN
INTRAVENOUS | Status: AC
  Filled 2016-06-08: qty 50

## 2016-06-08 MED ORDER — SODIUM CHLORIDE 0.9 % IJ SOLN
10.0000 mL | INTRAMUSCULAR | Status: DC | PRN
  Administered 2016-06-08: 10 mL via INTRAVENOUS
  Filled 2016-06-08: qty 10

## 2016-06-08 MED ORDER — SODIUM CHLORIDE 0.9% FLUSH
10.0000 mL | INTRAVENOUS | Status: DC | PRN
  Administered 2016-06-08: 10 mL via INTRAVENOUS
  Filled 2016-06-08: qty 10

## 2016-06-08 MED ORDER — SODIUM CHLORIDE 0.9 % IV SOLN
Freq: Once | INTRAVENOUS | Status: AC
  Administered 2016-06-08: 14:00:00 via INTRAVENOUS

## 2016-06-08 MED ORDER — SODIUM CHLORIDE 0.9 % IV SOLN
Freq: Once | INTRAVENOUS | Status: AC
  Administered 2016-06-08: 13:00:00 via INTRAVENOUS
  Filled 2016-06-08: qty 4

## 2016-06-08 MED ORDER — FAMOTIDINE IN NACL 20-0.9 MG/50ML-% IV SOLN
20.0000 mg | Freq: Once | INTRAVENOUS | Status: AC
  Administered 2016-06-08: 20 mg via INTRAVENOUS

## 2016-06-08 MED ORDER — DIPHENHYDRAMINE HCL 50 MG/ML IJ SOLN
INTRAMUSCULAR | Status: AC
  Filled 2016-06-08: qty 1

## 2016-06-08 MED ORDER — INSULIN REGULAR HUMAN 100 UNIT/ML IJ SOLN
2.0000 [IU] | Freq: Once | INTRAMUSCULAR | Status: AC | PRN
  Administered 2016-06-08: 2 [IU] via SUBCUTANEOUS
  Filled 2016-06-08: qty 0.02

## 2016-06-08 MED ORDER — SODIUM CHLORIDE 0.9 % IV SOLN
80.0000 mg/m2 | Freq: Once | INTRAVENOUS | Status: AC
  Administered 2016-06-08: 174 mg via INTRAVENOUS
  Filled 2016-06-08: qty 29

## 2016-06-08 MED ORDER — DIPHENHYDRAMINE HCL 50 MG/ML IJ SOLN
25.0000 mg | Freq: Once | INTRAMUSCULAR | Status: AC
  Administered 2016-06-08: 25 mg via INTRAVENOUS

## 2016-06-08 NOTE — Patient Instructions (Signed)
[facility] Discharge Instructions for Patients Receiving Chemotherapy  Today you received the following chemotherapy agents Taxol   To help prevent nausea and vomiting after your treatment, we encourage you to take your nausea medication as directed.   If you develop nausea and vomiting that is not controlled by your nausea medication, call the clinic.   BELOW ARE SYMPTOMS THAT SHOULD BE REPORTED IMMEDIATELY:  *FEVER GREATER THAN 100.5 F  *CHILLS WITH OR WITHOUT FEVER  NAUSEA AND VOMITING THAT IS NOT CONTROLLED WITH YOUR NAUSEA MEDICATION  *UNUSUAL SHORTNESS OF BREATH  *UNUSUAL BRUISING OR BLEEDING  TENDERNESS IN MOUTH AND THROAT WITH OR WITHOUT PRESENCE OF ULCERS  *URINARY PROBLEMS  *BOWEL PROBLEMS  UNUSUAL RASH Items with * indicate a potential emergency and should be followed up as soon as possible.  Feel free to call the clinic you have any questions or concerns. The clinic phone number is [phone].  Please show the CHEMO ALERT CARD at check-in to the Emergency Department and triage nurse.   

## 2016-06-09 ENCOUNTER — Ambulatory Visit (HOSPITAL_BASED_OUTPATIENT_CLINIC_OR_DEPARTMENT_OTHER): Payer: Medicare Other

## 2016-06-09 VITALS — BP 129/55 | HR 88 | Temp 98.2°F | Resp 20

## 2016-06-09 DIAGNOSIS — C541 Malignant neoplasm of endometrium: Secondary | ICD-10-CM

## 2016-06-09 DIAGNOSIS — D701 Agranulocytosis secondary to cancer chemotherapy: Secondary | ICD-10-CM

## 2016-06-09 MED ORDER — TBO-FILGRASTIM 480 MCG/0.8ML ~~LOC~~ SOSY
480.0000 ug | PREFILLED_SYRINGE | Freq: Once | SUBCUTANEOUS | Status: AC
  Administered 2016-06-09: 480 ug via SUBCUTANEOUS
  Filled 2016-06-09: qty 0.8

## 2016-06-09 NOTE — Patient Instructions (Signed)

## 2016-06-14 ENCOUNTER — Other Ambulatory Visit: Payer: Self-pay | Admitting: Oncology

## 2016-06-15 ENCOUNTER — Other Ambulatory Visit (HOSPITAL_BASED_OUTPATIENT_CLINIC_OR_DEPARTMENT_OTHER): Payer: PRIVATE HEALTH INSURANCE

## 2016-06-15 ENCOUNTER — Ambulatory Visit: Payer: Medicare Other

## 2016-06-15 ENCOUNTER — Ambulatory Visit (HOSPITAL_BASED_OUTPATIENT_CLINIC_OR_DEPARTMENT_OTHER): Payer: Medicare Other | Admitting: Oncology

## 2016-06-15 ENCOUNTER — Other Ambulatory Visit: Payer: Self-pay

## 2016-06-15 ENCOUNTER — Encounter: Payer: Self-pay | Admitting: Oncology

## 2016-06-15 VITALS — BP 128/67 | HR 107 | Temp 98.3°F | Resp 18 | Ht 62.0 in | Wt 234.8 lb

## 2016-06-15 DIAGNOSIS — I10 Essential (primary) hypertension: Secondary | ICD-10-CM | POA: Diagnosis not present

## 2016-06-15 DIAGNOSIS — C541 Malignant neoplasm of endometrium: Secondary | ICD-10-CM

## 2016-06-15 DIAGNOSIS — Z86718 Personal history of other venous thrombosis and embolism: Secondary | ICD-10-CM

## 2016-06-15 DIAGNOSIS — K219 Gastro-esophageal reflux disease without esophagitis: Secondary | ICD-10-CM | POA: Diagnosis not present

## 2016-06-15 DIAGNOSIS — D701 Agranulocytosis secondary to cancer chemotherapy: Secondary | ICD-10-CM

## 2016-06-15 DIAGNOSIS — T451X5A Adverse effect of antineoplastic and immunosuppressive drugs, initial encounter: Secondary | ICD-10-CM

## 2016-06-15 DIAGNOSIS — C55 Malignant neoplasm of uterus, part unspecified: Secondary | ICD-10-CM

## 2016-06-15 DIAGNOSIS — Z95828 Presence of other vascular implants and grafts: Secondary | ICD-10-CM

## 2016-06-15 DIAGNOSIS — Z5111 Encounter for antineoplastic chemotherapy: Secondary | ICD-10-CM

## 2016-06-15 LAB — CBC WITH DIFFERENTIAL/PLATELET
BASO%: 0.4 % (ref 0.0–2.0)
BASOS ABS: 0 10*3/uL (ref 0.0–0.1)
EOS ABS: 0 10*3/uL (ref 0.0–0.5)
EOS%: 0 % (ref 0.0–7.0)
HEMATOCRIT: 30 % — AB (ref 34.8–46.6)
HEMOGLOBIN: 9.8 g/dL — AB (ref 11.6–15.9)
LYMPH#: 0.2 10*3/uL — AB (ref 0.9–3.3)
LYMPH%: 18.6 % (ref 14.0–49.7)
MCH: 30.5 pg (ref 25.1–34.0)
MCHC: 32.6 g/dL (ref 31.5–36.0)
MCV: 93.4 fL (ref 79.5–101.0)
MONO#: 0 10*3/uL — AB (ref 0.1–0.9)
MONO%: 1.5 % (ref 0.0–14.0)
NEUT#: 1 10*3/uL — ABNORMAL LOW (ref 1.5–6.5)
NEUT%: 79.5 % — ABNORMAL HIGH (ref 38.4–76.8)
Platelets: 175 10*3/uL (ref 145–400)
RBC: 3.21 10*6/uL — ABNORMAL LOW (ref 3.70–5.45)
RDW: 17.1 % — ABNORMAL HIGH (ref 11.2–14.5)
WBC: 1.3 10*3/uL — AB (ref 3.9–10.3)

## 2016-06-15 LAB — COMPREHENSIVE METABOLIC PANEL
ALBUMIN: 3 g/dL — AB (ref 3.5–5.0)
ALK PHOS: 107 U/L (ref 40–150)
ALT: 18 U/L (ref 0–55)
AST: 13 U/L (ref 5–34)
Anion Gap: 10 mEq/L (ref 3–11)
BILIRUBIN TOTAL: 0.42 mg/dL (ref 0.20–1.20)
BUN: 18.2 mg/dL (ref 7.0–26.0)
CALCIUM: 9.9 mg/dL (ref 8.4–10.4)
CO2: 22 mEq/L (ref 22–29)
Chloride: 104 mEq/L (ref 98–109)
Creatinine: 1 mg/dL (ref 0.6–1.1)
EGFR: 55 mL/min/{1.73_m2} — AB (ref >90)
Glucose: 245 mg/dl — ABNORMAL HIGH (ref 70–140)
POTASSIUM: 4.5 meq/L (ref 3.5–5.1)
Sodium: 136 mEq/L (ref 136–145)
Total Protein: 5.9 g/dL — ABNORMAL LOW (ref 6.4–8.3)

## 2016-06-15 MED ORDER — HEPARIN SOD (PORK) LOCK FLUSH 100 UNIT/ML IV SOLN
500.0000 [IU] | Freq: Once | INTRAVENOUS | Status: AC
  Administered 2016-06-15: 500 [IU] via INTRAVENOUS
  Filled 2016-06-15: qty 5

## 2016-06-15 MED ORDER — SODIUM CHLORIDE 0.9 % IJ SOLN
10.0000 mL | INTRAMUSCULAR | Status: DC | PRN
Start: 2016-06-15 — End: 2016-06-15
  Administered 2016-06-15: 10 mL via INTRAVENOUS
  Filled 2016-06-15: qty 10

## 2016-06-15 MED ORDER — FILGRASTIM 480 MCG/0.8ML IJ SOSY
480.0000 ug | PREFILLED_SYRINGE | Freq: Once | INTRAMUSCULAR | Status: AC
  Administered 2016-06-15: 480 ug via SUBCUTANEOUS
  Filled 2016-06-15: qty 0.8

## 2016-06-15 NOTE — Progress Notes (Signed)
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 She had day 15 cycle 2 on 06-08-16 with granix on day 16, however ANC is too low for planned day 1 cycle 3 today.  Plan is to consider HDR after chemo if doing well.  Patient has been more fatigued in past week, and had pain especially in knees after granix. She regularly uses bid tramadol for arthritis, can increase this to tid if needed for granix aches. She has had no fever or symptoms of infection. She is using some OTC for possible vaginal yeast, which she finds helpful, will let us know what that is. She denies nausea, increased SOB, bleeding, swelling in LE, any problems with PAC. Bowels are moving and she has no abdominal or pelvic discomfort. No increased peripheral neuropathy Remainder of 10 point Review of Systems negative.  PAC placed by IR 05-15-16 No genetics testing.   Patient has cruise planned Oct 7-14 . She is concerned that she will not feel well for this.   ONCOLOGIC HISTORY Patient had one episode of heavier vaginal bleeding on 01-28-16, then two later episodes of spotting. She saw Dr Benjie Karvonen, with [place] and D&C 03-04-16 demonstrating high grade serous endometrial carcinoma ([id number]). She was referred to Dr Denman George, with CT CAP 03-24-16 with 20 mm endometrium, 2 mildly enlarged left external iliac nodes and a mildly enlarged left para aortic node, but without evidence of other distant disease. She had robotic total hysterectomy BSO and sentinel node evaluation by Dr Denman George on 03-26-16, with pathology [id number]) high grade serous carcinoma of endometrium extending into outer half of myometrium, + LVSI, involved left obturator sentinel node with 3 other sentinel nodes negative, ovaries and tubes not  involved. Post operative course was not remarkable. She completed 14 days of prophylactic lovenox postoperatively. SHe saw Dr Denman George in follow up on 04-20-16, doing well from surgery, recommendation for 6 cycles of carboplatin taxol and consideration of HDR at completion of chemo if doing well. Day 1 cycle 1 dose dense carbo taxol was given 05-04-16. She was neutropenic day 15 cycle 1, granix added.   Objective:  Vital signs in last 24 hours:  BP 128/67 (BP Location: Right Arm, Patient Position: Sitting)   Pulse (!) 107   Temp 98.3 F (36.8 C) (Oral)   Resp 18   Ht _0  (1.575 m)   Wt 234 lb 12.8 oz (106.5 kg)   SpO2 97%   BMI 42.95 kg/m  Weight up 1 lb. Morbidly obese, respirations not labored at rest. Alert, oriented and appropriate. Ambulatory with some difficulty.    HEENT:PERRL, sclerae not icteric. Oral mucosa moist without lesions, posterior pharynx clear, no thrush  Neck supple. No JVD.  Lymphatics:no supraclavicular adenopathy Resp: clear to auscultation bilaterally and normal percussion bilaterally Cardio: regular rate and rhythm. No gallop. GI: abdomen obese, soft, nontender. Some bowel sounds.  Musculoskeletal/ Extremities: without pitting edema, cords, tenderness Neuro: no peripheral neuropathy. Otherwise nonfocal. PSYCH appropriate mood and affect Skin without rash, ecchymosis, petechiae Portacath-without erythema or tenderness  Lab Results:  Results for orders placed or performed in visit on 06/15/16  CBC with Differential  Result Value Ref Range   WBC 1.3 (L) 3.9 - 10.3 10e3/uL   NEUT# 1.0 (L) 1.5 - 6.5 10e3/uL   HGB 9.8 (L)  11.6 - 15.9 g/dL   HCT 30.0 (L) 34.8 - 46.6 %   Platelets 175 145 - 400 10e3/uL   MCV 93.4 79.5 - 101.0 fL   MCH 30.5 25.1 - 34.0 pg   MCHC 32.6 31.5 - 36.0 g/dL   RBC 3.21 (L) 3.70 - 5.45 10e6/uL   RDW 17.1 (H) 11.2 - 14.5 %   lymph# 0.2 (L) 0.9 - 3.3 10e3/uL   MONO# 0.0 (L) 0.1 - 0.9 10e3/uL   Eosinophils Absolute 0.0 0.0 - 0.5  10e3/uL   Basophils Absolute 0.0 0.0 - 0.1 10e3/uL   NEUT% 79.5 (H) 38.4 - 76.8 %   LYMPH% 18.6 14.0 - 49.7 %   MONO% 1.5 0.0 - 14.0 %   EOS% 0.0 0.0 - 7.0 %   BASO% 0.4 0.0 - 2.0 %  Comprehensive metabolic panel  Result Value Ref Range   Sodium 136 136 - 145 mEq/L   Potassium 4.5 3.5 - 5.1 mEq/L   Chloride 104 98 - 109 mEq/L   CO2 22 22 - 29 mEq/L   Glucose 245 (H) 70 - 140 mg/dl   BUN 18.2 7.0 - 26.0 mg/dL   Creatinine 1.0 0.6 - 1.1 mg/dL   Total Bilirubin 0.42 0.20 - 1.20 mg/dL   Alkaline Phosphatase 107 40 - 150 U/L   AST 13 5 - 34 U/L   ALT 18 0 - 55 U/L   Total Protein 5.9 (L) 6.4 - 8.3 g/dL   Albumin 3.0 (L) 3.5 - 5.0 g/dL   Calcium 9.9 8.4 - 10.4 mg/dL   Anion Gap 10 3 - 11 mEq/L   EGFR 55 (L) >90 ml/min/1.73 m2     Studies/Results:  No results found.  Medications: I have reviewed the patient's current medications. Can increase tramadol to tid if needed for granix aches   Granix 9-18 and 9-19 OK for flu vaccine ~ 10-4  DISCUSSION Hold chemo today with ANC 1.0, give granix today and 9-19. Give day 1 cycle 3 on 9-25, with granix 9-26 and 9-27 Hold chemo 10-2 for her trip 10-7 thru 10-14. Resume with day 1 cycle 4 on 10-16.  Assessment/Plan:  1. IIIC1 high grade serous endometrial carcinoma: post robotic assisted total hysterectomy BSO and sentinel node evaluation by Dr Denman George 03-26-16. Plan 6 cycles of carboplatin taxol as adjuvant therapy, dose dense weekly regimen begun8-7-17. Hold today with ANC 1.0, then day 1 cycle 3 on 9-25 as long as ANC >=1.5 and plt >=100k, then hold until start cycle 4 07-13-16 due to her trip. I will see her with lab 10-2 prior to her trip.  Plan consideration of HDR after completion of chemo if doing well 2.PAC 3.history LE DVT 20 years ago. Completed prophylactic lovenox following gyn onc surgerypostoperatively, no findings of concern on exam now.  4.borderline DM. Expect transient elevation of blood sugars with steroids for taxol,  SS insulin in chemo orders.  5.degenerative disease and scoliosis LS with chronic back pain. Known to Dr Geanie Kenning replacements 6.obstructive sleep apnea on CPAP 7.urinary frequency with related sleep disturbance, long-standing. Urine culture negative 05-04-16 8. Post lap chole and umbilical hernia repair. HH. GERD controlled on prilosce 9.HTN controlled. Elevated lipids. CAD by CT. Atherosclerosis thoracic and abdominal aorta on CT 10.hypothyroid on synthroid by Dr Alyson Ingles 11.up to date colonoscopy, no polyps per patient 12.morbid obesity  All questions answered. Chemo and granix orders revised. Time spent 25 min including >50% counseling and coordination of care. Route Dr Alyson Ingles.  Evlyn Clines, MD   06/15/2016, 7:22 PM

## 2016-06-16 ENCOUNTER — Ambulatory Visit (HOSPITAL_BASED_OUTPATIENT_CLINIC_OR_DEPARTMENT_OTHER): Payer: Medicare Other

## 2016-06-16 VITALS — BP 118/65 | HR 100 | Temp 98.3°F | Resp 20

## 2016-06-16 DIAGNOSIS — D701 Agranulocytosis secondary to cancer chemotherapy: Secondary | ICD-10-CM | POA: Diagnosis present

## 2016-06-16 DIAGNOSIS — C541 Malignant neoplasm of endometrium: Secondary | ICD-10-CM | POA: Diagnosis not present

## 2016-06-16 MED ORDER — TBO-FILGRASTIM 480 MCG/0.8ML ~~LOC~~ SOSY
480.0000 ug | PREFILLED_SYRINGE | Freq: Once | SUBCUTANEOUS | Status: AC
  Administered 2016-06-16: 480 ug via SUBCUTANEOUS
  Filled 2016-06-16: qty 0.8

## 2016-06-16 NOTE — Patient Instructions (Signed)

## 2016-06-17 ENCOUNTER — Telehealth: Payer: Self-pay

## 2016-06-17 DIAGNOSIS — B379 Candidiasis, unspecified: Secondary | ICD-10-CM

## 2016-06-17 MED ORDER — FLUCONAZOLE 100 MG PO TABS: 100.0000 mg | ORAL_TABLET | Freq: Every day | ORAL | 1 refills | Status: DC

## 2016-06-17 NOTE — Telephone Encounter (Signed)
LM in patient's vm stating prescription instructions as note below by Dr. Marko Plume.

## 2016-06-17 NOTE — Telephone Encounter (Signed)
Melinda Kerr called stating that she has another yeast infection and the one day cream did not clear the infection this time. Patient said that Dr. Marko Plume could call in a prescription for the yeast infection.

## 2016-06-17 NOTE — Telephone Encounter (Signed)
Melinda Marion Downer, MD  Baruch Merl, RN         [name] called stating that she has another yeast infection and the one day cream did not clear the infection this time.  Patient said that Dr. Marko Plume could call in a prescription for the yeast infection.    Diflucan 100 mg daily x 7 for yeast.  One refill   Can let patient know that she should take for at least 3-5 days, can stop then if completely resolved.  thanks

## 2016-06-22 ENCOUNTER — Ambulatory Visit: Payer: Medicare Other

## 2016-06-22 ENCOUNTER — Other Ambulatory Visit (HOSPITAL_BASED_OUTPATIENT_CLINIC_OR_DEPARTMENT_OTHER): Payer: Medicare Other

## 2016-06-22 ENCOUNTER — Other Ambulatory Visit: Payer: Self-pay | Admitting: Nurse Practitioner

## 2016-06-22 ENCOUNTER — Ambulatory Visit (HOSPITAL_BASED_OUTPATIENT_CLINIC_OR_DEPARTMENT_OTHER): Payer: Medicare Other

## 2016-06-22 VITALS — BP 125/68 | HR 84 | Temp 98.5°F | Resp 20

## 2016-06-22 DIAGNOSIS — C541 Malignant neoplasm of endometrium: Secondary | ICD-10-CM | POA: Diagnosis not present

## 2016-06-22 DIAGNOSIS — R739 Hyperglycemia, unspecified: Secondary | ICD-10-CM

## 2016-06-22 DIAGNOSIS — IMO0001 Reserved for inherently not codable concepts without codable children: Secondary | ICD-10-CM

## 2016-06-22 DIAGNOSIS — E119 Type 2 diabetes mellitus without complications: Secondary | ICD-10-CM

## 2016-06-22 DIAGNOSIS — Z5111 Encounter for antineoplastic chemotherapy: Secondary | ICD-10-CM

## 2016-06-22 DIAGNOSIS — T380X5A Adverse effect of glucocorticoids and synthetic analogues, initial encounter: Principal | ICD-10-CM

## 2016-06-22 DIAGNOSIS — Z794 Long term (current) use of insulin: Secondary | ICD-10-CM

## 2016-06-22 DIAGNOSIS — C530 Malignant neoplasm of endocervix: Secondary | ICD-10-CM

## 2016-06-22 DIAGNOSIS — Z6841 Body Mass Index (BMI) 40.0 and over, adult: Secondary | ICD-10-CM

## 2016-06-22 DIAGNOSIS — T50905A Adverse effect of unspecified drugs, medicaments and biological substances, initial encounter: Principal | ICD-10-CM

## 2016-06-22 DIAGNOSIS — Z95828 Presence of other vascular implants and grafts: Secondary | ICD-10-CM

## 2016-06-22 LAB — COMPREHENSIVE METABOLIC PANEL
ALBUMIN: 3.1 g/dL — AB (ref 3.5–5.0)
ALK PHOS: 115 U/L (ref 40–150)
ALT: 18 U/L (ref 0–55)
ANION GAP: 11 meq/L (ref 3–11)
AST: 17 U/L (ref 5–34)
BUN: 20.4 mg/dL (ref 7.0–26.0)
CO2: 21 meq/L — AB (ref 22–29)
CREATININE: 1 mg/dL (ref 0.6–1.1)
Calcium: 9.7 mg/dL (ref 8.4–10.4)
Chloride: 105 mEq/L (ref 98–109)
EGFR: 55 mL/min/{1.73_m2} — ABNORMAL LOW (ref >90)
GLUCOSE: 247 mg/dL — AB (ref 70–140)
Potassium: 5 mEq/L (ref 3.5–5.1)
SODIUM: 138 meq/L (ref 136–145)
TOTAL PROTEIN: 6.1 g/dL — AB (ref 6.4–8.3)

## 2016-06-22 LAB — CBC WITH DIFFERENTIAL/PLATELET
BASO%: 0.4 % (ref 0.0–2.0)
Basophils Absolute: 0 10*3/uL (ref 0.0–0.1)
EOS%: 0 % (ref 0.0–7.0)
Eosinophils Absolute: 0 10*3/uL (ref 0.0–0.5)
HCT: 32.6 % — ABNORMAL LOW (ref 34.8–46.6)
HEMOGLOBIN: 10.6 g/dL — AB (ref 11.6–15.9)
LYMPH#: 0.6 10*3/uL — AB (ref 0.9–3.3)
LYMPH%: 12.6 % — AB (ref 14.0–49.7)
MCH: 31.2 pg (ref 25.1–34.0)
MCHC: 32.4 g/dL (ref 31.5–36.0)
MCV: 96.2 fL (ref 79.5–101.0)
MONO#: 0.1 10*3/uL (ref 0.1–0.9)
MONO%: 1.1 % (ref 0.0–14.0)
NEUT%: 85.9 % — ABNORMAL HIGH (ref 38.4–76.8)
NEUTROS ABS: 4.2 10*3/uL (ref 1.5–6.5)
PLATELETS: 450 10*3/uL — AB (ref 145–400)
RBC: 3.39 10*6/uL — AB (ref 3.70–5.45)
RDW: 20.3 % — AB (ref 11.2–14.5)
WBC: 4.9 10*3/uL (ref 3.9–10.3)

## 2016-06-22 MED ORDER — HEPARIN SOD (PORK) LOCK FLUSH 100 UNIT/ML IV SOLN
500.0000 [IU] | Freq: Once | INTRAVENOUS | Status: AC | PRN
  Administered 2016-06-22: 500 [IU]
  Filled 2016-06-22: qty 5

## 2016-06-22 MED ORDER — DIPHENHYDRAMINE HCL 50 MG/ML IJ SOLN
25.0000 mg | Freq: Once | INTRAMUSCULAR | Status: AC
  Administered 2016-06-22: 25 mg via INTRAVENOUS

## 2016-06-22 MED ORDER — INSULIN REGULAR HUMAN 100 UNIT/ML IJ SOLN
2.0000 [IU] | Freq: Once | INTRAMUSCULAR | Status: AC | PRN
  Administered 2016-06-22: 6 [IU] via SUBCUTANEOUS
  Filled 2016-06-22: qty 0.08

## 2016-06-22 MED ORDER — SODIUM CHLORIDE 0.9% FLUSH
10.0000 mL | INTRAVENOUS | Status: DC | PRN
  Administered 2016-06-22: 10 mL
  Filled 2016-06-22: qty 10

## 2016-06-22 MED ORDER — FAMOTIDINE IN NACL 20-0.9 MG/50ML-% IV SOLN
20.0000 mg | Freq: Once | INTRAVENOUS | Status: AC
  Administered 2016-06-22: 20 mg via INTRAVENOUS

## 2016-06-22 MED ORDER — SODIUM CHLORIDE 0.9 % IV SOLN
Freq: Once | INTRAVENOUS | Status: AC
  Administered 2016-06-22: 14:00:00 via INTRAVENOUS
  Filled 2016-06-22: qty 4

## 2016-06-22 MED ORDER — SODIUM CHLORIDE 0.9 % IV SOLN
Freq: Once | INTRAVENOUS | Status: AC
  Administered 2016-06-22: 14:00:00 via INTRAVENOUS

## 2016-06-22 MED ORDER — SODIUM CHLORIDE 0.9 % IJ SOLN
10.0000 mL | INTRAMUSCULAR | Status: DC | PRN
Start: 2016-06-22 — End: 2016-06-22
  Administered 2016-06-22: 10 mL via INTRAVENOUS
  Filled 2016-06-22: qty 10

## 2016-06-22 MED ORDER — FILGRASTIM 480 MCG/0.8ML IJ SOSY: 480.0000 ug | PREFILLED_SYRINGE | Freq: Once | INTRAMUSCULAR | Status: DC

## 2016-06-22 MED ORDER — CARBOPLATIN CHEMO INJECTION 600 MG/60ML
506.0000 mg | Freq: Once | INTRAVENOUS | Status: AC
  Administered 2016-06-22: 510 mg via INTRAVENOUS
  Filled 2016-06-22: qty 51

## 2016-06-22 MED ORDER — FAMOTIDINE IN NACL 20-0.9 MG/50ML-% IV SOLN
INTRAVENOUS | Status: AC
  Filled 2016-06-22: qty 50

## 2016-06-22 MED ORDER — SODIUM CHLORIDE 0.9 % IV SOLN
80.0000 mg/m2 | Freq: Once | INTRAVENOUS | Status: AC
  Administered 2016-06-22: 174 mg via INTRAVENOUS
  Filled 2016-06-22: qty 29

## 2016-06-22 MED ORDER — DIPHENHYDRAMINE HCL 50 MG/ML IJ SOLN
INTRAMUSCULAR | Status: AC
  Filled 2016-06-22: qty 1

## 2016-06-22 MED ORDER — SODIUM CHLORIDE 0.9 % IV SOLN
Freq: Once | INTRAVENOUS | Status: AC
  Administered 2016-06-22: 15:00:00 via INTRAVENOUS
  Filled 2016-06-22: qty 5

## 2016-06-22 NOTE — Progress Notes (Signed)
Glucose repeated per Dr. Marko Plume, result 198, per sliding scale pt to receive 4 units regular insulin. Order unable to be placed, Selena Lesser NP at beside and 4 units regular Insulin given by Selena Lesser NP. Pt stable at time of discharge and states she is going to dinner at this time.

## 2016-06-22 NOTE — Patient Instructions (Signed)

## 2016-06-22 NOTE — Patient Instructions (Signed)
[facility] Discharge Instructions for Patients Receiving Chemotherapy  Today you received the following chemotherapy agents Taxol and Carboplatin. To help prevent nausea and vomiting after your treatment, we encourage you to take your nausea medication as directed.  If you develop nausea and vomiting that is not controlled by your nausea medication, call the clinic.   BELOW ARE SYMPTOMS THAT SHOULD BE REPORTED IMMEDIATELY:  *FEVER GREATER THAN 100.5 F  *CHILLS WITH OR WITHOUT FEVER  NAUSEA AND VOMITING THAT IS NOT CONTROLLED WITH YOUR NAUSEA MEDICATION  *UNUSUAL SHORTNESS OF BREATH  *UNUSUAL BRUISING OR BLEEDING  TENDERNESS IN MOUTH AND THROAT WITH OR WITHOUT PRESENCE OF ULCERS  *URINARY PROBLEMS  *BOWEL PROBLEMS  UNUSUAL RASH Items with * indicate a potential emergency and should be followed up as soon as possible.  Feel free to call the clinic you have any questions or concerns. The clinic phone number is [phone].  Please show the CHEMO ALERT CARD at check-in to the Emergency Department and triage nurse.    

## 2016-06-23 ENCOUNTER — Other Ambulatory Visit: Payer: Self-pay | Admitting: *Deleted

## 2016-06-23 ENCOUNTER — Ambulatory Visit (HOSPITAL_BASED_OUTPATIENT_CLINIC_OR_DEPARTMENT_OTHER): Payer: Medicare Other

## 2016-06-23 VITALS — BP 113/67 | HR 101 | Temp 98.4°F | Resp 18

## 2016-06-23 DIAGNOSIS — C541 Malignant neoplasm of endometrium: Secondary | ICD-10-CM | POA: Diagnosis present

## 2016-06-23 DIAGNOSIS — Z5189 Encounter for other specified aftercare: Secondary | ICD-10-CM

## 2016-06-23 LAB — WHOLE BLOOD GLUCOSE
Glucose: 198 mg/dL — ABNORMAL HIGH (ref 70–100)
HRS PC: 3.5 Hours

## 2016-06-23 MED ORDER — TBO-FILGRASTIM 480 MCG/0.8ML ~~LOC~~ SOSY
480.0000 ug | PREFILLED_SYRINGE | Freq: Once | SUBCUTANEOUS | Status: AC
  Administered 2016-06-23: 480 ug via SUBCUTANEOUS
  Filled 2016-06-23: qty 0.8

## 2016-06-23 MED ORDER — FILGRASTIM 480 MCG/0.8ML IJ SOSY: 480.0000 ug | PREFILLED_SYRINGE | Freq: Once | INTRAMUSCULAR | Status: DC

## 2016-06-23 NOTE — Patient Instructions (Signed)

## 2016-06-24 ENCOUNTER — Ambulatory Visit (HOSPITAL_BASED_OUTPATIENT_CLINIC_OR_DEPARTMENT_OTHER): Payer: Medicare Other

## 2016-06-24 VITALS — BP 104/64 | HR 99 | Temp 98.2°F | Resp 22

## 2016-06-24 DIAGNOSIS — C541 Malignant neoplasm of endometrium: Secondary | ICD-10-CM

## 2016-06-24 DIAGNOSIS — D701 Agranulocytosis secondary to cancer chemotherapy: Secondary | ICD-10-CM | POA: Diagnosis present

## 2016-06-24 MED ORDER — TBO-FILGRASTIM 480 MCG/0.8ML ~~LOC~~ SOSY
480.0000 ug | PREFILLED_SYRINGE | Freq: Once | SUBCUTANEOUS | Status: AC
  Administered 2016-06-24: 480 ug via SUBCUTANEOUS
  Filled 2016-06-24: qty 0.8

## 2016-06-24 MED ORDER — TBO-FILGRASTIM 480 MCG/0.8ML ~~LOC~~ SOSY
480.0000 ug | PREFILLED_SYRINGE | Freq: Once | SUBCUTANEOUS | Status: DC
  Filled 2016-06-24: qty 0.8

## 2016-06-24 MED ORDER — FILGRASTIM 480 MCG/0.8ML IJ SOSY: 480.0000 ug | PREFILLED_SYRINGE | Freq: Once | INTRAMUSCULAR | Status: DC

## 2016-06-24 NOTE — Patient Instructions (Signed)

## 2016-06-25 ENCOUNTER — Telehealth: Payer: Self-pay

## 2016-06-25 NOTE — Telephone Encounter (Signed)
Pt was inquiring if she needed to take her pre taxol steroids for her visit on Monday. Informed her she does not take steriods this Monday, but she will when she comes on 10/16.

## 2016-06-28 ENCOUNTER — Other Ambulatory Visit: Payer: Self-pay | Admitting: Oncology

## 2016-06-28 DIAGNOSIS — C541 Malignant neoplasm of endometrium: Secondary | ICD-10-CM

## 2016-06-29 ENCOUNTER — Other Ambulatory Visit (HOSPITAL_BASED_OUTPATIENT_CLINIC_OR_DEPARTMENT_OTHER): Payer: Medicare Other

## 2016-06-29 ENCOUNTER — Ambulatory Visit: Payer: Medicare Other

## 2016-06-29 ENCOUNTER — Ambulatory Visit (HOSPITAL_BASED_OUTPATIENT_CLINIC_OR_DEPARTMENT_OTHER): Payer: Medicare Other | Admitting: Oncology

## 2016-06-29 VITALS — BP 110/66 | HR 88 | Temp 97.7°F | Resp 18 | Ht 62.0 in | Wt 233.7 lb

## 2016-06-29 DIAGNOSIS — Z95828 Presence of other vascular implants and grafts: Secondary | ICD-10-CM

## 2016-06-29 DIAGNOSIS — G620 Drug-induced polyneuropathy: Secondary | ICD-10-CM | POA: Diagnosis not present

## 2016-06-29 DIAGNOSIS — D701 Agranulocytosis secondary to cancer chemotherapy: Secondary | ICD-10-CM

## 2016-06-29 DIAGNOSIS — R5383 Other fatigue: Secondary | ICD-10-CM | POA: Diagnosis not present

## 2016-06-29 DIAGNOSIS — D649 Anemia, unspecified: Secondary | ICD-10-CM

## 2016-06-29 DIAGNOSIS — Z23 Encounter for immunization: Secondary | ICD-10-CM

## 2016-06-29 DIAGNOSIS — T451X5A Adverse effect of antineoplastic and immunosuppressive drugs, initial encounter: Secondary | ICD-10-CM

## 2016-06-29 DIAGNOSIS — C541 Malignant neoplasm of endometrium: Secondary | ICD-10-CM

## 2016-06-29 DIAGNOSIS — D6481 Anemia due to antineoplastic chemotherapy: Secondary | ICD-10-CM

## 2016-06-29 DIAGNOSIS — I10 Essential (primary) hypertension: Secondary | ICD-10-CM

## 2016-06-29 DIAGNOSIS — Z86718 Personal history of other venous thrombosis and embolism: Secondary | ICD-10-CM

## 2016-06-29 DIAGNOSIS — Z6841 Body Mass Index (BMI) 40.0 and over, adult: Secondary | ICD-10-CM

## 2016-06-29 LAB — COMPREHENSIVE METABOLIC PANEL
ALBUMIN: 3.1 g/dL — AB (ref 3.5–5.0)
ALK PHOS: 109 U/L (ref 40–150)
ALT: 18 U/L (ref 0–55)
AST: 21 U/L (ref 5–34)
Anion Gap: 8 mEq/L (ref 3–11)
BILIRUBIN TOTAL: 0.77 mg/dL (ref 0.20–1.20)
BUN: 29.1 mg/dL — AB (ref 7.0–26.0)
CALCIUM: 9.7 mg/dL (ref 8.4–10.4)
CO2: 24 mEq/L (ref 22–29)
CREATININE: 0.8 mg/dL (ref 0.6–1.1)
Chloride: 106 mEq/L (ref 98–109)
EGFR: 72 mL/min/{1.73_m2} — ABNORMAL LOW (ref >90)
GLUCOSE: 136 mg/dL (ref 70–140)
Potassium: 4.9 mEq/L (ref 3.5–5.1)
SODIUM: 138 meq/L (ref 136–145)
TOTAL PROTEIN: 5.8 g/dL — AB (ref 6.4–8.3)

## 2016-06-29 LAB — CBC WITH DIFFERENTIAL/PLATELET
BASO%: 1.3 % (ref 0.0–2.0)
BASOS ABS: 0 10*3/uL (ref 0.0–0.1)
EOS%: 0.5 % (ref 0.0–7.0)
Eosinophils Absolute: 0 10*3/uL (ref 0.0–0.5)
HEMATOCRIT: 28.7 % — AB (ref 34.8–46.6)
HGB: 9.5 g/dL — ABNORMAL LOW (ref 11.6–15.9)
LYMPH%: 23.9 % (ref 14.0–49.7)
MCH: 31.8 pg (ref 25.1–34.0)
MCHC: 33.2 g/dL (ref 31.5–36.0)
MCV: 95.7 fL (ref 79.5–101.0)
MONO#: 0.2 10*3/uL (ref 0.1–0.9)
MONO%: 7.9 % (ref 0.0–14.0)
NEUT#: 1.9 10*3/uL (ref 1.5–6.5)
NEUT%: 66.4 % (ref 38.4–76.8)
Platelets: 267 10*3/uL (ref 145–400)
RBC: 3 10*6/uL — ABNORMAL LOW (ref 3.70–5.45)
RDW: 20.9 % — ABNORMAL HIGH (ref 11.2–14.5)
WBC: 2.8 10*3/uL — ABNORMAL LOW (ref 3.9–10.3)
lymph#: 0.7 10*3/uL — ABNORMAL LOW (ref 0.9–3.3)

## 2016-06-29 MED ORDER — INFLUENZA VAC SPLIT QUAD 0.5 ML IM SUSY
0.5000 mL | PREFILLED_SYRINGE | Freq: Once | INTRAMUSCULAR | Status: AC
  Administered 2016-06-29: 0.5 mL via INTRAMUSCULAR
  Filled 2016-06-29: qty 0.5

## 2016-06-29 MED ORDER — SODIUM CHLORIDE 0.9 % IJ SOLN
10.0000 mL | INTRAMUSCULAR | Status: DC | PRN
  Administered 2016-06-29: 10 mL via INTRAVENOUS
  Filled 2016-06-29: qty 10

## 2016-06-29 MED ORDER — HEPARIN SOD (PORK) LOCK FLUSH 100 UNIT/ML IV SOLN
500.0000 [IU] | Freq: Once | INTRAVENOUS | Status: AC | PRN
  Administered 2016-06-29: 500 [IU] via INTRAVENOUS
  Filled 2016-06-29: qty 5

## 2016-06-29 NOTE — Progress Notes (Signed)
OFFICE PROGRESS NOTE   July 01, 2016   Physicians: Rise Paganini, MD; V.Mody, F.Alusio, R.Buccini, Alphonsa Overall  INTERVAL HISTORY:   Patient is seen, alone for visit,, in continuing attention to adjuvant chemotherapy being given for IIIC1 high grade serous endometrial carcinoma, with day 1 cycle 3 dose dense carbo taxol given on 06-22-16 with granix 480 mcg given days 2 and 3.  Patient will go on a previously planned cruise Oct 7-14, such that she requests treatment held until after that trip.   Patient is more fatigued since most recent chemo, likely in part due to progressive anemia, hemoglobin 9.5 today. She denies chest pain, SOB at rest, any bleeding. She drove herself to office today without difficulty. Nausea has been controlled, bowels are moving much better with colace, Appetite is fine. No fever or symptoms of infection. No increased LE swelling. Minimal peripheral neuropathy. PAC fine. No fever or symptoms of infection.  Remainder of 10 point Review of Systems negative.    PAC placed by IR 05-15-16 No genetics testing. Flu vaccine 06-29-16   ONCOLOGIC HISTORY Patient had one episode of heavier vaginal bleeding on 01-28-16, then two later episodes of spotting. She saw Dr Benjie Karvonen, with [place] and D&C 03-04-16 demonstrating high grade serous endometrial carcinoma ([id number]). She was referred to Dr Denman George, with CT CAP 03-24-16 with 20 mm endometrium, 2 mildly enlarged left external iliac nodes and a mildly enlarged left para aortic node, but without evidence of other distant disease. She had robotic total hysterectomy BSO and sentinel node evaluation by Dr Denman George on 03-26-16, with pathology [id number]) high grade serous carcinoma of endometrium extending into outer half of myometrium, + LVSI, involved left obturator sentinel node with 3 other sentinel nodes negative, ovaries and tubes not involved. Post operative course was not remarkable. She completed 14 days of prophylactic lovenox  postoperatively. SHe saw Dr Denman George in follow up on 04-20-16, doing well from surgery, recommendation for 6 cycles of carboplatin taxol and consideration of HDR at completion of chemo if doing well. Day 1 cycle 1 dose dense carbo taxol was given 05-04-16. She was neutropenic day 15 cycle 1, granix added.  Objective:  Vital signs in last 24 hours:  BP 110/66 (BP Location: Left Wrist, Patient Position: Sitting)   Pulse 88   Temp 97.7 F (36.5 C) (Oral)   Resp 18   Ht _0  (1.575 m)   Wt 233 lb 11.2 oz (106 kg)   SpO2 100%   BMI 42.74 kg/m  Weight stable Alert, oriented and appropriate. Ambulatory with cane. Appears comfortable seated in exam room, respirations not labored RA  HEENT:PERRL, sclerae not icteric. Oral mucosa moist without lesions, posterior pharynx clear.  Neck supple. No JVD.  Lymphatics:no cervical,supraclavicular adenopathy Resp: clear to auscultation bilaterally and normal percussion bilaterally Cardio: regular rate and rhythm. No gallop. GI: abdomen obese, soft, nontender, not distended, no mass or organomegaly. Normally active bowel sounds.  Musculoskeletal/ Extremities: LE without pitting edema, cords, tenderness Neuro: no significant peripheral neuropathy. Otherwise nonfocal. PSYCH appropriate mood and affect Skin without rash, ecchymosis, petechiae Portacath-without erythema or tenderness  Lab Results:  Results for orders placed or performed in visit on 06/29/16  CBC with Differential  Result Value Ref Range   WBC 2.8 (L) 3.9 - 10.3 10e3/uL   NEUT# 1.9 1.5 - 6.5 10e3/uL   HGB 9.5 (L) 11.6 - 15.9 g/dL   HCT 28.7 (L) 34.8 - 46.6 %   Platelets 267 145 - 400 10e3/uL  MCV 95.7 79.5 - 101.0 fL   MCH 31.8 25.1 - 34.0 pg   MCHC 33.2 31.5 - 36.0 g/dL   RBC 3.00 (L) 3.70 - 5.45 10e6/uL   RDW 20.9 (H) 11.2 - 14.5 %   lymph# 0.7 (L) 0.9 - 3.3 10e3/uL   MONO# 0.2 0.1 - 0.9 10e3/uL   Eosinophils Absolute 0.0 0.0 - 0.5 10e3/uL   Basophils Absolute 0.0 0.0 - 0.1  10e3/uL   NEUT% 66.4 38.4 - 76.8 %   LYMPH% 23.9 14.0 - 49.7 %   MONO% 7.9 0.0 - 14.0 %   EOS% 0.5 0.0 - 7.0 %   BASO% 1.3 0.0 - 2.0 %  Comprehensive metabolic panel  Result Value Ref Range   Sodium 138 136 - 145 mEq/L   Potassium 4.9 3.5 - 5.1 mEq/L   Chloride 106 98 - 109 mEq/L   CO2 24 22 - 29 mEq/L   Glucose 136 70 - 140 mg/dl   BUN 29.1 (H) 7.0 - 26.0 mg/dL   Creatinine 0.8 0.6 - 1.1 mg/dL   Total Bilirubin 0.77 0.20 - 1.20 mg/dL   Alkaline Phosphatase 109 40 - 150 U/L   AST 21 5 - 34 U/L   ALT 18 0 - 55 U/L   Total Protein 5.8 (L) 6.4 - 8.3 g/dL   Albumin 3.1 (L) 3.5 - 5.0 g/dL   Calcium 9.7 8.4 - 10.4 mg/dL   Anion Gap 8 3 - 11 mEq/L   EGFR 72 (L) >90 ml/min/1.73 m2     Studies/Results:  No results found.  Medications: I have reviewed the patient's current medications. She is taking some oral iron.  Flu vaccine given today  DISCUSSION Blood counts reviewed, hopefully will improve over next 2 weeks with chemo held for her trip.   Will resume chemo with day 1 cycle 4 on 07-13-16, with granix support days 2,3,9,16  Discussed peripheral neuropathy  Assessment/Plan:  1. IIIC1 high grade serous endometrial carcinoma: post robotic assisted total hysterectomy BSO and sentinel node evaluation by Dr Denman George 03-26-16. Plan 6 cycles of carboplatin taxol as adjuvant therapy, dose dense weekly regimen begun8-7-17, has needed gCSF support. She will not have days 8,15 cycle 3 at her request due to previously planned cruise.  Plan consideration of HDR after completion of chemo if doing well 2.PAC in place 3.history LE DVT 20 years ago. Completed prophylactic lovenox following gyn onc surgerypostoperatively, no findings of concern on exam now.  4.borderline DM. Expect transient elevation of blood sugars with steroids for taxol, SS insulin in chemo orders.  5.degenerative disease and scoliosis LS with chronic back pain. Known to Dr Geanie Kenning replacements 6.obstructive sleep  apnea on CPAP 7.urinary frequency with related sleep disturbance, long-standing. Urine culture negative 05-04-16 8. Post lap chole and umbilical hernia repair. HH. GERD controlled on prilosce 9.HTN controlled. Elevated lipids. CAD by CT. Atherosclerosis thoracic and abdominal aorta on CT 10.hypothyroid on synthroid by Dr Alyson Ingles 11.up to date colonoscopy, no polyps per patient 12.morbid obesity 13.progressive anemia on chemo: not symptomatic enough at 9.5 to require transfusion and she can limit activity as needed. Continue oral iron, follow 14.flu vaccine 06-29-16 15.chemo peripheral neuropathy minimal, follow on taxol.  16.morbid obesity: likely contributed to development of endometrial cancer. Education ongoing.  All questions answered. Will treat with day 1 cycle 4 on 07-13-16 as long as [place] >=1.5 and plt >=100k. Chemo and granix orders confirmed. Time spent 25 min including >50% counseling and coordination of care. Route PCP  Jaiah Weigel  Marko Plume, MD   07/01/2016, 12:30 PM

## 2016-06-30 ENCOUNTER — Ambulatory Visit: Payer: Medicare Other

## 2016-07-01 ENCOUNTER — Encounter: Payer: Self-pay | Admitting: Oncology

## 2016-07-01 DIAGNOSIS — D6481 Anemia due to antineoplastic chemotherapy: Secondary | ICD-10-CM | POA: Insufficient documentation

## 2016-07-01 DIAGNOSIS — G620 Drug-induced polyneuropathy: Secondary | ICD-10-CM | POA: Insufficient documentation

## 2016-07-01 DIAGNOSIS — T451X5A Adverse effect of antineoplastic and immunosuppressive drugs, initial encounter: Secondary | ICD-10-CM

## 2016-07-13 ENCOUNTER — Ambulatory Visit: Payer: Medicare Other

## 2016-07-13 ENCOUNTER — Ambulatory Visit (HOSPITAL_BASED_OUTPATIENT_CLINIC_OR_DEPARTMENT_OTHER): Payer: Medicare Other

## 2016-07-13 ENCOUNTER — Other Ambulatory Visit (HOSPITAL_BASED_OUTPATIENT_CLINIC_OR_DEPARTMENT_OTHER): Payer: Medicare Other

## 2016-07-13 VITALS — BP 108/47 | HR 92 | Temp 98.2°F | Resp 17

## 2016-07-13 DIAGNOSIS — C541 Malignant neoplasm of endometrium: Secondary | ICD-10-CM | POA: Diagnosis not present

## 2016-07-13 DIAGNOSIS — Z95828 Presence of other vascular implants and grafts: Secondary | ICD-10-CM

## 2016-07-13 DIAGNOSIS — Z5111 Encounter for antineoplastic chemotherapy: Secondary | ICD-10-CM

## 2016-07-13 DIAGNOSIS — R7303 Prediabetes: Secondary | ICD-10-CM | POA: Diagnosis not present

## 2016-07-13 LAB — COMPREHENSIVE METABOLIC PANEL
ALK PHOS: 128 U/L (ref 40–150)
ALT: 18 U/L (ref 0–55)
ANION GAP: 11 meq/L (ref 3–11)
AST: 19 U/L (ref 5–34)
Albumin: 3.2 g/dL — ABNORMAL LOW (ref 3.5–5.0)
BILIRUBIN TOTAL: 0.24 mg/dL (ref 0.20–1.20)
BUN: 19.6 mg/dL (ref 7.0–26.0)
CO2: 22 meq/L (ref 22–29)
Calcium: 10.2 mg/dL (ref 8.4–10.4)
Chloride: 103 mEq/L (ref 98–109)
Creatinine: 1 mg/dL (ref 0.6–1.1)
EGFR: 54 mL/min/{1.73_m2} — AB (ref >90)
GLUCOSE: 276 mg/dL — AB (ref 70–140)
POTASSIUM: 4.6 meq/L (ref 3.5–5.1)
SODIUM: 136 meq/L (ref 136–145)
TOTAL PROTEIN: 6.5 g/dL (ref 6.4–8.3)

## 2016-07-13 LAB — CBC WITH DIFFERENTIAL/PLATELET
BASO%: 0 % (ref 0.0–2.0)
BASOS ABS: 0 10*3/uL (ref 0.0–0.1)
EOS ABS: 0 10*3/uL (ref 0.0–0.5)
EOS%: 0 % (ref 0.0–7.0)
HCT: 29.4 % — ABNORMAL LOW (ref 34.8–46.6)
HGB: 9.6 g/dL — ABNORMAL LOW (ref 11.6–15.9)
LYMPH%: 12.7 % — AB (ref 14.0–49.7)
MCH: 32.2 pg (ref 25.1–34.0)
MCHC: 32.7 g/dL (ref 31.5–36.0)
MCV: 98.7 fL (ref 79.5–101.0)
MONO#: 0.1 10*3/uL (ref 0.1–0.9)
MONO%: 1.2 % (ref 0.0–14.0)
NEUT#: 3.5 10*3/uL (ref 1.5–6.5)
NEUT%: 86.1 % — AB (ref 38.4–76.8)
PLATELETS: 162 10*3/uL (ref 145–400)
RBC: 2.98 10*6/uL — AB (ref 3.70–5.45)
RDW: 20.5 % — ABNORMAL HIGH (ref 11.2–14.5)
WBC: 4 10*3/uL (ref 3.9–10.3)
lymph#: 0.5 10*3/uL — ABNORMAL LOW (ref 0.9–3.3)

## 2016-07-13 MED ORDER — FAMOTIDINE IN NACL 20-0.9 MG/50ML-% IV SOLN
INTRAVENOUS | Status: AC
  Filled 2016-07-13: qty 50

## 2016-07-13 MED ORDER — FAMOTIDINE IN NACL 20-0.9 MG/50ML-% IV SOLN
20.0000 mg | Freq: Once | INTRAVENOUS | Status: AC
  Administered 2016-07-13: 20 mg via INTRAVENOUS

## 2016-07-13 MED ORDER — SODIUM CHLORIDE 0.9 % IV SOLN
Freq: Once | INTRAVENOUS | Status: AC
  Administered 2016-07-13: 14:00:00 via INTRAVENOUS

## 2016-07-13 MED ORDER — SODIUM CHLORIDE 0.9% FLUSH
10.0000 mL | INTRAVENOUS | Status: DC | PRN
  Administered 2016-07-13: 10 mL
  Filled 2016-07-13: qty 10

## 2016-07-13 MED ORDER — SODIUM CHLORIDE 0.9 % IJ SOLN
10.0000 mL | INTRAMUSCULAR | Status: AC | PRN
  Administered 2016-07-13: 10 mL
  Filled 2016-07-13: qty 10

## 2016-07-13 MED ORDER — INSULIN REGULAR HUMAN 100 UNIT/ML IJ SOLN
8.0000 [IU] | Freq: Once | INTRAMUSCULAR | Status: AC | PRN
  Administered 2016-07-13: 8 [IU] via SUBCUTANEOUS
  Filled 2016-07-13: qty 0.08

## 2016-07-13 MED ORDER — SODIUM CHLORIDE 0.9 % IV SOLN
80.0000 mg/m2 | Freq: Once | INTRAVENOUS | Status: AC
  Administered 2016-07-13: 174 mg via INTRAVENOUS
  Filled 2016-07-13: qty 29

## 2016-07-13 MED ORDER — HEPARIN SOD (PORK) LOCK FLUSH 100 UNIT/ML IV SOLN
500.0000 [IU] | Freq: Once | INTRAVENOUS | Status: AC | PRN
  Administered 2016-07-13: 500 [IU]
  Filled 2016-07-13: qty 5

## 2016-07-13 MED ORDER — SODIUM CHLORIDE 0.9 % IV SOLN
Freq: Once | INTRAVENOUS | Status: AC
  Administered 2016-07-13: 15:00:00 via INTRAVENOUS
  Filled 2016-07-13: qty 5

## 2016-07-13 MED ORDER — CARBOPLATIN CHEMO INJECTION 600 MG/60ML
506.0000 mg | Freq: Once | INTRAVENOUS | Status: AC
  Administered 2016-07-13: 510 mg via INTRAVENOUS
  Filled 2016-07-13: qty 51

## 2016-07-13 MED ORDER — SODIUM CHLORIDE 0.9 % IV SOLN
Freq: Once | INTRAVENOUS | Status: DC
Start: 2016-07-13 — End: 2016-07-13

## 2016-07-13 MED ORDER — DIPHENHYDRAMINE HCL 50 MG/ML IJ SOLN
25.0000 mg | Freq: Once | INTRAMUSCULAR | Status: AC
  Administered 2016-07-13: 25 mg via INTRAVENOUS

## 2016-07-13 MED ORDER — ONDANSETRON HCL 8 MG PO TABS
ORAL_TABLET | ORAL | Status: AC
  Filled 2016-07-13: qty 1

## 2016-07-13 MED ORDER — DIPHENHYDRAMINE HCL 50 MG/ML IJ SOLN
INTRAMUSCULAR | Status: AC
  Filled 2016-07-13: qty 1

## 2016-07-13 MED ORDER — ONDANSETRON HCL 8 MG PO TABS
8.0000 mg | ORAL_TABLET | Freq: Once | ORAL | Status: AC
  Administered 2016-07-13: 8 mg via ORAL

## 2016-07-13 NOTE — Patient Instructions (Signed)
[facility] Discharge Instructions for Patients Receiving Chemotherapy  Today you received the following chemotherapy agents:  Carboplatin, Taxol  To help prevent nausea and vomiting after your treatment, we encourage you to take your nausea medication as prescribed.   If you develop nausea and vomiting that is not controlled by your nausea medication, call the clinic.   BELOW ARE SYMPTOMS THAT SHOULD BE REPORTED IMMEDIATELY:  *FEVER GREATER THAN 100.5 F  *CHILLS WITH OR WITHOUT FEVER  NAUSEA AND VOMITING THAT IS NOT CONTROLLED WITH YOUR NAUSEA MEDICATION  *UNUSUAL SHORTNESS OF BREATH  *UNUSUAL BRUISING OR BLEEDING  TENDERNESS IN MOUTH AND THROAT WITH OR WITHOUT PRESENCE OF ULCERS  *URINARY PROBLEMS  *BOWEL PROBLEMS  UNUSUAL RASH Items with * indicate a potential emergency and should be followed up as soon as possible.  Feel free to call the clinic you have any questions or concerns. The clinic phone number is [phone].  Please show the CHEMO ALERT CARD at check-in to the Emergency Department and triage nurse.   

## 2016-07-14 ENCOUNTER — Ambulatory Visit (HOSPITAL_BASED_OUTPATIENT_CLINIC_OR_DEPARTMENT_OTHER): Payer: Medicare Other

## 2016-07-14 VITALS — BP 120/61 | HR 86 | Temp 98.0°F | Resp 20

## 2016-07-14 DIAGNOSIS — Z5189 Encounter for other specified aftercare: Secondary | ICD-10-CM | POA: Diagnosis not present

## 2016-07-14 DIAGNOSIS — C541 Malignant neoplasm of endometrium: Secondary | ICD-10-CM

## 2016-07-14 MED ORDER — TBO-FILGRASTIM 480 MCG/0.8ML ~~LOC~~ SOSY
480.0000 ug | PREFILLED_SYRINGE | Freq: Once | SUBCUTANEOUS | Status: AC
  Administered 2016-07-14: 480 ug via SUBCUTANEOUS
  Filled 2016-07-14: qty 0.8

## 2016-07-14 NOTE — Patient Instructions (Signed)

## 2016-07-15 ENCOUNTER — Telehealth: Payer: Self-pay | Admitting: *Deleted

## 2016-07-15 ENCOUNTER — Ambulatory Visit (HOSPITAL_BASED_OUTPATIENT_CLINIC_OR_DEPARTMENT_OTHER): Payer: Medicare Other

## 2016-07-15 VITALS — BP 114/84 | HR 100 | Temp 98.2°F | Resp 18

## 2016-07-15 DIAGNOSIS — C541 Malignant neoplasm of endometrium: Secondary | ICD-10-CM

## 2016-07-15 DIAGNOSIS — Z5189 Encounter for other specified aftercare: Secondary | ICD-10-CM

## 2016-07-15 MED ORDER — TBO-FILGRASTIM 480 MCG/0.8ML ~~LOC~~ SOSY
480.0000 ug | PREFILLED_SYRINGE | Freq: Once | SUBCUTANEOUS | Status: AC
  Administered 2016-07-15: 480 ug via SUBCUTANEOUS
  Filled 2016-07-15: qty 0.8

## 2016-07-15 NOTE — Patient Instructions (Signed)

## 2016-07-15 NOTE — Telephone Encounter (Signed)
This phone note printed and placed on DR. Livesay's desk to review.

## 2016-07-15 NOTE — Telephone Encounter (Signed)
"  I have an appointment with Dr. Marko Plume on 07-20-2016.  I need a letter to cancel an [place] trip.  I will pick the letter up at this time.  My friend and I took trips to [place] and I need to cancel this trip.

## 2016-07-19 ENCOUNTER — Other Ambulatory Visit: Payer: Self-pay | Admitting: Oncology

## 2016-07-20 ENCOUNTER — Other Ambulatory Visit (HOSPITAL_BASED_OUTPATIENT_CLINIC_OR_DEPARTMENT_OTHER): Payer: Medicare Other

## 2016-07-20 ENCOUNTER — Ambulatory Visit: Payer: Medicare Other

## 2016-07-20 ENCOUNTER — Ambulatory Visit (HOSPITAL_COMMUNITY)
Admission: RE | Admit: 2016-07-20 | Discharge: 2016-07-20 | Disposition: A | Discharge status: Home / Self Care | Payer: Medicare Other | Source: Ambulatory Visit | Attending: Oncology | Admitting: Oncology

## 2016-07-20 ENCOUNTER — Other Ambulatory Visit: Payer: Self-pay | Admitting: *Deleted

## 2016-07-20 ENCOUNTER — Ambulatory Visit (HOSPITAL_BASED_OUTPATIENT_CLINIC_OR_DEPARTMENT_OTHER): Payer: Medicare Other | Admitting: Oncology

## 2016-07-20 ENCOUNTER — Telehealth: Payer: Self-pay | Admitting: Oncology

## 2016-07-20 ENCOUNTER — Ambulatory Visit (HOSPITAL_BASED_OUTPATIENT_CLINIC_OR_DEPARTMENT_OTHER): Payer: Medicare Other

## 2016-07-20 VITALS — BP 107/57 | HR 102 | Temp 97.8°F | Resp 18 | Ht 62.0 in | Wt 231.1 lb

## 2016-07-20 DIAGNOSIS — D701 Agranulocytosis secondary to cancer chemotherapy: Secondary | ICD-10-CM

## 2016-07-20 DIAGNOSIS — K219 Gastro-esophageal reflux disease without esophagitis: Secondary | ICD-10-CM

## 2016-07-20 DIAGNOSIS — Z86718 Personal history of other venous thrombosis and embolism: Secondary | ICD-10-CM | POA: Diagnosis not present

## 2016-07-20 DIAGNOSIS — T451X5A Adverse effect of antineoplastic and immunosuppressive drugs, initial encounter: Secondary | ICD-10-CM

## 2016-07-20 DIAGNOSIS — I10 Essential (primary) hypertension: Secondary | ICD-10-CM

## 2016-07-20 DIAGNOSIS — Z95828 Presence of other vascular implants and grafts: Secondary | ICD-10-CM

## 2016-07-20 DIAGNOSIS — G620 Drug-induced polyneuropathy: Secondary | ICD-10-CM

## 2016-07-20 DIAGNOSIS — R0609 Other forms of dyspnea: Secondary | ICD-10-CM

## 2016-07-20 DIAGNOSIS — D6481 Anemia due to antineoplastic chemotherapy: Secondary | ICD-10-CM | POA: Insufficient documentation

## 2016-07-20 DIAGNOSIS — Z5111 Encounter for antineoplastic chemotherapy: Secondary | ICD-10-CM | POA: Diagnosis not present

## 2016-07-20 DIAGNOSIS — C541 Malignant neoplasm of endometrium: Secondary | ICD-10-CM | POA: Diagnosis not present

## 2016-07-20 LAB — CBC WITH DIFFERENTIAL/PLATELET
BASO%: 0 % (ref 0.0–2.0)
BASOS ABS: 0 10*3/uL (ref 0.0–0.1)
EOS ABS: 0 10*3/uL (ref 0.0–0.5)
EOS%: 0 % (ref 0.0–7.0)
HEMATOCRIT: 26.8 % — AB (ref 34.8–46.6)
HEMOGLOBIN: 8.9 g/dL — AB (ref 11.6–15.9)
LYMPH%: 11.2 % — ABNORMAL LOW (ref 14.0–49.7)
MCH: 32.4 pg (ref 25.1–34.0)
MCHC: 33.2 g/dL (ref 31.5–36.0)
MCV: 97.5 fL (ref 79.5–101.0)
MONO#: 0.1 10*3/uL (ref 0.1–0.9)
MONO%: 4.7 % (ref 0.0–14.0)
NEUT#: 2.3 10*3/uL (ref 1.5–6.5)
NEUT%: 84.1 % — ABNORMAL HIGH (ref 38.4–76.8)
PLATELETS: 175 10*3/uL (ref 145–400)
RBC: 2.75 10*6/uL — ABNORMAL LOW (ref 3.70–5.45)
RDW: 19 % — AB (ref 11.2–14.5)
WBC: 2.8 10*3/uL — ABNORMAL LOW (ref 3.9–10.3)
lymph#: 0.3 10*3/uL — ABNORMAL LOW (ref 0.9–3.3)

## 2016-07-20 LAB — COMPREHENSIVE METABOLIC PANEL
ALBUMIN: 3.2 g/dL — AB (ref 3.5–5.0)
ALK PHOS: 122 U/L (ref 40–150)
ALT: 17 U/L (ref 0–55)
ANION GAP: 10 meq/L (ref 3–11)
AST: 18 U/L (ref 5–34)
BUN: 21.9 mg/dL (ref 7.0–26.0)
CALCIUM: 9.8 mg/dL (ref 8.4–10.4)
CO2: 21 mEq/L — ABNORMAL LOW (ref 22–29)
Chloride: 104 mEq/L (ref 98–109)
Creatinine: 0.9 mg/dL (ref 0.6–1.1)
EGFR: 63 mL/min/{1.73_m2} — AB (ref >90)
Glucose: 212 mg/dl — ABNORMAL HIGH (ref 70–140)
POTASSIUM: 4.9 meq/L (ref 3.5–5.1)
Sodium: 135 mEq/L — ABNORMAL LOW (ref 136–145)
Total Bilirubin: 0.56 mg/dL (ref 0.20–1.20)
Total Protein: 6.3 g/dL — ABNORMAL LOW (ref 6.4–8.3)

## 2016-07-20 MED ORDER — FAMOTIDINE IN NACL 20-0.9 MG/50ML-% IV SOLN
INTRAVENOUS | Status: AC
  Filled 2016-07-20: qty 50

## 2016-07-20 MED ORDER — SODIUM CHLORIDE 0.9 % IV SOLN: Freq: Once | INTRAVENOUS | Status: DC

## 2016-07-20 MED ORDER — HEPARIN SOD (PORK) LOCK FLUSH 100 UNIT/ML IV SOLN
500.0000 [IU] | Freq: Once | INTRAVENOUS | Status: AC
  Administered 2016-07-20: 500 [IU] via INTRAVENOUS
  Filled 2016-07-20: qty 5

## 2016-07-20 MED ORDER — DEXAMETHASONE SODIUM PHOSPHATE 100 MG/10ML IJ SOLN
Freq: Once | INTRAMUSCULAR | Status: AC
  Administered 2016-07-20: 16:00:00 via INTRAVENOUS
  Filled 2016-07-20: qty 4

## 2016-07-20 MED ORDER — INSULIN REGULAR HUMAN 100 UNIT/ML IJ SOLN
2.0000 [IU] | Freq: Once | INTRAMUSCULAR | Status: DC | PRN
Start: 2016-07-20 — End: 2016-07-20
  Filled 2016-07-20: qty 0.08

## 2016-07-20 MED ORDER — SODIUM CHLORIDE 0.9 % IJ SOLN
10.0000 mL | INTRAMUSCULAR | Status: DC | PRN
  Administered 2016-07-20: 10 mL via INTRAVENOUS
  Filled 2016-07-20: qty 10

## 2016-07-20 MED ORDER — SODIUM CHLORIDE 0.9 % IV SOLN
80.0000 mg/m2 | Freq: Once | INTRAVENOUS | Status: AC
  Administered 2016-07-20: 174 mg via INTRAVENOUS
  Filled 2016-07-20: qty 29

## 2016-07-20 MED ORDER — SODIUM CHLORIDE 0.9% FLUSH
10.0000 mL | INTRAVENOUS | Status: DC | PRN
  Administered 2016-07-20: 10 mL via INTRAVENOUS
  Filled 2016-07-20: qty 10

## 2016-07-20 MED ORDER — FAMOTIDINE IN NACL 20-0.9 MG/50ML-% IV SOLN
20.0000 mg | Freq: Once | INTRAVENOUS | Status: AC
  Administered 2016-07-20: 20 mg via INTRAVENOUS

## 2016-07-20 MED ORDER — DIPHENHYDRAMINE HCL 50 MG/ML IJ SOLN
25.0000 mg | Freq: Once | INTRAMUSCULAR | Status: AC
  Administered 2016-07-20: 25 mg via INTRAVENOUS

## 2016-07-20 MED ORDER — DIPHENHYDRAMINE HCL 50 MG/ML IJ SOLN
INTRAMUSCULAR | Status: AC
  Filled 2016-07-20: qty 1

## 2016-07-20 NOTE — Telephone Encounter (Signed)
GAVE PATIENT AVS REPORT AND APPOINTMENTS FOR October AND November  °

## 2016-07-20 NOTE — Patient Instructions (Signed)
[facility] Discharge Instructions for Patients Receiving Chemotherapy  Today you received the following chemotherapy agents:  Carboplatin, Taxol  To help prevent nausea and vomiting after your treatment, we encourage you to take your nausea medication as prescribed.   If you develop nausea and vomiting that is not controlled by your nausea medication, call the clinic.   BELOW ARE SYMPTOMS THAT SHOULD BE REPORTED IMMEDIATELY:  *FEVER GREATER THAN 100.5 F  *CHILLS WITH OR WITHOUT FEVER  NAUSEA AND VOMITING THAT IS NOT CONTROLLED WITH YOUR NAUSEA MEDICATION  *UNUSUAL SHORTNESS OF BREATH  *UNUSUAL BRUISING OR BLEEDING  TENDERNESS IN MOUTH AND THROAT WITH OR WITHOUT PRESENCE OF ULCERS  *URINARY PROBLEMS  *BOWEL PROBLEMS  UNUSUAL RASH Items with * indicate a potential emergency and should be followed up as soon as possible.  Feel free to call the clinic you have any questions or concerns. The clinic phone number is [phone].  Please show the CHEMO ALERT CARD at check-in to the Emergency Department and triage nurse.   

## 2016-07-21 ENCOUNTER — Encounter: Payer: Self-pay | Admitting: Oncology

## 2016-07-21 ENCOUNTER — Ambulatory Visit: Payer: Medicare Other

## 2016-07-21 ENCOUNTER — Other Ambulatory Visit: Payer: Self-pay | Admitting: *Deleted

## 2016-07-21 ENCOUNTER — Ambulatory Visit (HOSPITAL_BASED_OUTPATIENT_CLINIC_OR_DEPARTMENT_OTHER): Payer: Medicare Other

## 2016-07-21 VITALS — BP 119/63 | HR 68 | Temp 97.7°F | Resp 18

## 2016-07-21 DIAGNOSIS — C541 Malignant neoplasm of endometrium: Secondary | ICD-10-CM

## 2016-07-21 DIAGNOSIS — Z5189 Encounter for other specified aftercare: Secondary | ICD-10-CM

## 2016-07-21 DIAGNOSIS — T451X5A Adverse effect of antineoplastic and immunosuppressive drugs, initial encounter: Principal | ICD-10-CM

## 2016-07-21 DIAGNOSIS — D6481 Anemia due to antineoplastic chemotherapy: Secondary | ICD-10-CM | POA: Diagnosis not present

## 2016-07-21 LAB — PREPARE RBC (CROSSMATCH)

## 2016-07-21 MED ORDER — ACETAMINOPHEN 325 MG PO TABS
ORAL_TABLET | ORAL | Status: AC
  Filled 2016-07-21: qty 2

## 2016-07-21 MED ORDER — SODIUM CHLORIDE 0.9% FLUSH
10.0000 mL | INTRAVENOUS | Status: AC | PRN
  Administered 2016-07-21: 10 mL
  Filled 2016-07-21: qty 10

## 2016-07-21 MED ORDER — HEPARIN SOD (PORK) LOCK FLUSH 100 UNIT/ML IV SOLN
500.0000 [IU] | Freq: Every day | INTRAVENOUS | Status: AC | PRN
  Administered 2016-07-21: 500 [IU]
  Filled 2016-07-21: qty 5

## 2016-07-21 MED ORDER — ACETAMINOPHEN 325 MG PO TABS
325.0000 mg | ORAL_TABLET | Freq: Once | ORAL | Status: AC
  Administered 2016-07-21: 325 mg via ORAL

## 2016-07-21 MED ORDER — SODIUM CHLORIDE 0.9 % IV SOLN
250.0000 mL | Freq: Once | INTRAVENOUS | Status: AC
  Administered 2016-07-21: 250 mL via INTRAVENOUS

## 2016-07-21 MED ORDER — TBO-FILGRASTIM 480 MCG/0.8ML ~~LOC~~ SOSY
480.0000 ug | PREFILLED_SYRINGE | Freq: Once | SUBCUTANEOUS | Status: AC
  Administered 2016-07-21: 480 ug via SUBCUTANEOUS
  Filled 2016-07-21: qty 0.8

## 2016-07-21 NOTE — Progress Notes (Signed)
OFFICE PROGRESS NOTE   July 21, 2016   Physicians: Rise Paganini, MD; V.Mody, F.Alusio, R.Buccini, Alphonsa Overall  INTERVAL HISTORY:  Patient is seen, together with friend, in continuing attention to adjuvant chemo continuing for IIIC1 high grade serous endometrial carcinoma. She had day 1 cycle 4 dose dense carbo taxol on 07-13-16, due day 8 cycle 4 today. She is more symptomatic with chemo anemia now. Using granix days 2,3,9,16  Patient is more SOB with exertion, including walking in home, no SOB at rest and no chest pain. She denies nausea, vomiting, fever, abdominal or pelvic pain, LE swelling, any bleeding. No problems with PAC. She has slight peripheral neuropathy fingertips not interfering with function. Appetite ok, bowels moving well with stool softener, trying to stay well hydrated tho did not do that on recent trip. Remainder of 10 point Review of Systems negative   PAC placed by IR 05-15-16 No genetics testing. Flu vaccine 06-29-16  Patient and friend enjoyed cruise 10-7 thru 10-14, tho it was almost too strenuous for her: they are cancelling a similar trip planned upcoming and need physician letter for this, which I will write.   ONCOLOGIC HISTORY  Patient had one episode of heavier vaginal bleeding on 01-28-16, then two later episodes of spotting. She saw Dr Benjie Karvonen, with [place] and D&C 03-04-16 demonstrating high grade serous endometrial carcinoma ([id number]). She was referred to Dr Denman George, with CT CAP 03-24-16 with 20 mm endometrium, 2 mildly enlarged left external iliac nodes and a mildly enlarged left para aortic node, but without evidence of other distant disease. She had robotic total hysterectomy BSO and sentinel node evaluation by Dr Denman George on 03-26-16, with pathology [id number]) high grade serous carcinoma of endometrium extending into outer half of myometrium, + LVSI, involved left obturator sentinel node with 3 other sentinel nodes negative, ovaries and tubes not  involved. Post operative course was not remarkable. She completed 14 days of prophylactic lovenox postoperatively. SHe saw Dr Denman George in follow up on 04-20-16, doing well from surgery, recommendation for 6 cycles of carboplatin taxol and consideration of HDR at completion of chemo if doing well. Day 1 cycle 1 dose dense carbo taxol was given 05-04-16. She was neutropenic day 15 cycle 1, granix added.  Objective:  Vital signs in last 24 hours:  BP (!) 107/57 (BP Location: Right Arm, Patient Position: Sitting)   Pulse (!) 102   Temp 97.8 F (36.6 C) (Oral)   Resp 18   Ht '5\' 2"'  (1.575 m)   Wt 231 lb 1.6 oz (104.8 kg)   SpO2 95%   BMI 42.27 kg/m  Weight down 2 lbs Somewhat pale. Alert, oriented and appropriate. Ambulatory slowly.  Alopecia  HEENT:PERRL, sclerae not icteric. Oral mucosa moist without lesions, posterior pharynx clear.  Neck supple. No JVD.  Lymphatics:no cervical,supraclavicular, axillary or inguinal adenopathy Resp: Respirations not labored at rest, lungs clear to auscultation bilaterally  Cardio: regular rate and rhythm. No gallop. GI: abdomen obese, soft, nontender, not obviously distended, no appreciable mass or organomegaly. Normally active bowel sounds. Surgical incision not remarkable. Musculoskeletal/ Extremities: LE without pitting edema, cords, tenderness Neuro:  peripheral neuropathy as noted. Otherwise nonfocal. PSYCH appropriate mood and affect Skin without rash, ecchymosis, petechiae Portacath-without erythema or tenderness  Lab Results:  Results for orders placed or performed in visit on 07/20/16  CBC with Differential  Result Value Ref Range   WBC 2.8 (L) 3.9 - 10.3 10e3/uL   NEUT# 2.3 1.5 - 6.5 10e3/uL   HGB 8.9 (  L) 11.6 - 15.9 g/dL   HCT 26.8 (L) 34.8 - 46.6 %   Platelets 175 145 - 400 10e3/uL   MCV 97.5 79.5 - 101.0 fL   MCH 32.4 25.1 - 34.0 pg   MCHC 33.2 31.5 - 36.0 g/dL   RBC 2.75 (L) 3.70 - 5.45 10e6/uL   RDW 19.0 (H) 11.2 - 14.5 %   lymph#  0.3 (L) 0.9 - 3.3 10e3/uL   MONO# 0.1 0.1 - 0.9 10e3/uL   Eosinophils Absolute 0.0 0.0 - 0.5 10e3/uL   Basophils Absolute 0.0 0.0 - 0.1 10e3/uL   NEUT% 84.1 (H) 38.4 - 76.8 %   LYMPH% 11.2 (L) 14.0 - 49.7 %   MONO% 4.7 0.0 - 14.0 %   EOS% 0.0 0.0 - 7.0 %   BASO% 0.0 0.0 - 2.0 %  Comprehensive metabolic panel  Result Value Ref Range   Sodium 135 (L) 136 - 145 mEq/L   Potassium 4.9 3.5 - 5.1 mEq/L   Chloride 104 98 - 109 mEq/L   CO2 21 (L) 22 - 29 mEq/L   Glucose 212 (H) 70 - 140 mg/dl   BUN 21.9 7.0 - 26.0 mg/dL   Creatinine 0.9 0.6 - 1.1 mg/dL   Total Bilirubin 0.56 0.20 - 1.20 mg/dL   Alkaline Phosphatase 122 40 - 150 U/L   AST 18 5 - 34 U/L   ALT 17 0 - 55 U/L   Total Protein 6.3 (L) 6.4 - 8.3 g/dL   Albumin 3.2 (L) 3.5 - 5.0 g/dL   Calcium 9.8 8.4 - 10.4 mg/dL   Anion Gap 10 3 - 11 mEq/L   EGFR 63 (L) >90 ml/min/1.73 m2     Studies/Results:  No results found.  Medications: I have reviewed the patient's current medications.  DISCUSSION More symptomatic with chemo anemia, with ongoing treatment. She agrees with PRBCs x 1 unit, cross match today and   Assessment/Plan:   1. IIIC1 high grade serous endometrial carcinoma: post robotic assisted total hysterectomy BSO and sentinel node evaluation by Dr Denman George 03-26-16. Continuing dose dense carbo taxol, 6 cycles planned, and for consideration of HDR after completion of chemo if doing well. She has not had consultation with radiation oncology. 2.PAC in place 3.history LE DVT 20 years ago. Completed prophylactic lovenox following gyn onc surgerypostoperatively, no findings of concern on exam now.  4.borderline DM. Expect transient elevation of blood sugars with steroids for taxol, SS insulin in chemo orders.  5.degenerative disease and scoliosis LS with chronic back pain. Known to Dr Geanie Kenning replacements 6.obstructive sleep apnea on CPAP 7.urinary frequency with related sleep disturbance, long-standing.  8. Post lap  chole and umbilical hernia repair. HH. GERD controlled on prilosce 9.HTN controlled. Elevated lipids. CAD by CT. Atherosclerosis thoracic and abdominal aorta on CT 10.hypothyroid on synthroid by Dr Alyson Ingles 11.up to date colonoscopy, no polyps per patient 12.morbid obesity 13.progressive anemia on chemo despite oral iron, likely also component of chronic disease. More symptomatic and hgb now 8.5, will transfuse one unit PRBCs this week and follow. 14.flu vaccine 06-29-16 15.chemo peripheral neuropathy minimal, follow on taxol.  16.morbid obesity: likely contributed to development of endometrial cancer. Education ongoing. 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  All questions answered and she knows to call if needed prior to next scheduled visit. Chemo, granix and PRBC orders placed. Time spent 25 min including >50% counseling and coordination of care. Route PCP   Evlyn Clines, MD   07/21/2016, 12:48 PM

## 2016-07-21 NOTE — Patient Instructions (Signed)

## 2016-07-22 LAB — TYPE AND SCREEN
ABO/RH(D): A NEG
ANTIBODY SCREEN: NEGATIVE
Unit division: 0

## 2016-07-23 ENCOUNTER — Encounter: Payer: Self-pay | Admitting: Oncology

## 2016-07-23 NOTE — Progress Notes (Signed)
   [facility] [place], [place]  [address] Phone:  [phone]      TO WHOM IT MAY CONCERN:  Melinda Kerr is medically unable to keep planned trip to [place] in 2018.  Thank you for your  assistance with cancelling the reservations.   Gordy Levan, MD

## 2016-07-27 ENCOUNTER — Encounter: Payer: Self-pay | Admitting: Oncology

## 2016-07-27 ENCOUNTER — Other Ambulatory Visit: Payer: Self-pay | Admitting: Oncology

## 2016-07-27 ENCOUNTER — Ambulatory Visit: Payer: Medicare Other

## 2016-07-27 ENCOUNTER — Ambulatory Visit (HOSPITAL_BASED_OUTPATIENT_CLINIC_OR_DEPARTMENT_OTHER): Payer: Medicare Other

## 2016-07-27 ENCOUNTER — Other Ambulatory Visit (HOSPITAL_BASED_OUTPATIENT_CLINIC_OR_DEPARTMENT_OTHER): Payer: Medicare Other

## 2016-07-27 VITALS — BP 129/88 | HR 96 | Temp 98.1°F | Resp 20

## 2016-07-27 DIAGNOSIS — Z5189 Encounter for other specified aftercare: Secondary | ICD-10-CM | POA: Diagnosis not present

## 2016-07-27 DIAGNOSIS — C541 Malignant neoplasm of endometrium: Secondary | ICD-10-CM

## 2016-07-27 DIAGNOSIS — Z95828 Presence of other vascular implants and grafts: Secondary | ICD-10-CM

## 2016-07-27 LAB — CBC WITH DIFFERENTIAL/PLATELET
BASO%: 0.2 % (ref 0.0–2.0)
Basophils Absolute: 0 10*3/uL (ref 0.0–0.1)
EOS%: 0 % (ref 0.0–7.0)
Eosinophils Absolute: 0 10*3/uL (ref 0.0–0.5)
HCT: 29.3 % — ABNORMAL LOW (ref 34.8–46.6)
HEMOGLOBIN: 9.5 g/dL — AB (ref 11.6–15.9)
LYMPH%: 26.4 % (ref 14.0–49.7)
MCH: 32.7 pg (ref 25.1–34.0)
MCHC: 32.6 g/dL (ref 31.5–36.0)
MCV: 100.2 fL (ref 79.5–101.0)
MONO#: 0.1 10*3/uL (ref 0.1–0.9)
MONO%: 4.4 % (ref 0.0–14.0)
NEUT%: 69 % (ref 38.4–76.8)
NEUTROS ABS: 0.9 10*3/uL — AB (ref 1.5–6.5)
Platelets: 109 10*3/uL — ABNORMAL LOW (ref 145–400)
RBC: 2.92 10*6/uL — ABNORMAL LOW (ref 3.70–5.45)
RDW: 20.8 % — AB (ref 11.2–14.5)
WBC: 1.3 10*3/uL — AB (ref 3.9–10.3)
lymph#: 0.3 10*3/uL — ABNORMAL LOW (ref 0.9–3.3)

## 2016-07-27 LAB — COMPREHENSIVE METABOLIC PANEL
ALBUMIN: 3.4 g/dL — AB (ref 3.5–5.0)
ALK PHOS: 115 U/L (ref 40–150)
ALT: 18 U/L (ref 0–55)
AST: 17 U/L (ref 5–34)
Anion Gap: 11 mEq/L (ref 3–11)
BUN: 16 mg/dL (ref 7.0–26.0)
CO2: 22 mEq/L (ref 22–29)
Calcium: 9.8 mg/dL (ref 8.4–10.4)
Chloride: 104 mEq/L (ref 98–109)
Creatinine: 1 mg/dL (ref 0.6–1.1)
EGFR: 51 mL/min/{1.73_m2} — ABNORMAL LOW (ref >90)
GLUCOSE: 220 mg/dL — AB (ref 70–140)
POTASSIUM: 4.7 meq/L (ref 3.5–5.1)
SODIUM: 137 meq/L (ref 136–145)
Total Bilirubin: 0.51 mg/dL (ref 0.20–1.20)
Total Protein: 6.4 g/dL (ref 6.4–8.3)

## 2016-07-27 MED ORDER — SODIUM CHLORIDE 0.9 % IJ SOLN
10.0000 mL | INTRAMUSCULAR | Status: DC | PRN
  Administered 2016-07-27: 10 mL via INTRAVENOUS
  Filled 2016-07-27: qty 10

## 2016-07-27 MED ORDER — TBO-FILGRASTIM 480 MCG/0.8ML ~~LOC~~ SOSY
480.0000 ug | PREFILLED_SYRINGE | Freq: Once | SUBCUTANEOUS | Status: AC
  Administered 2016-07-27: 480 ug via SUBCUTANEOUS
  Filled 2016-07-27: qty 0.8

## 2016-07-27 NOTE — Progress Notes (Signed)
Reviewed pt labs (CBC) with Dr. Marko Plume and pt to not receive treatment today due to [place] 1.3. Pt to receive Grannix today per Dr. Marko Plume. Pt denies any complaints or fevers. Reviewed neutropenic precautions with understanding verbalized and pt had no further questions. Pt verbalized understanding of need for good hand hygiene and to wear masks in crowds. Pt aware of next appointments. Pt left ambulatory with use of cane.

## 2016-07-27 NOTE — Progress Notes (Signed)
Medical Oncology  Counts too low for day 15 cycle 4 today, with ANC 0.9 (and platelets only 109k). Hgb up since transfusion. Will cancel day 15 cycle 4 today Give granix today instead of 10-31 Keep next appointments as scheduled.  Godfrey Pick, MD

## 2016-07-27 NOTE — Patient Instructions (Signed)

## 2016-07-27 NOTE — Patient Instructions (Signed)

## 2016-07-28 ENCOUNTER — Ambulatory Visit: Payer: Medicare Other

## 2016-07-28 NOTE — Telephone Encounter (Signed)
Gave Ms Treece the letter for her trip cancellation while here for [facility] needle deaccess.

## 2016-08-02 ENCOUNTER — Other Ambulatory Visit: Payer: Self-pay | Admitting: Oncology

## 2016-08-03 ENCOUNTER — Ambulatory Visit: Payer: Medicare Other

## 2016-08-03 ENCOUNTER — Ambulatory Visit (HOSPITAL_BASED_OUTPATIENT_CLINIC_OR_DEPARTMENT_OTHER): Payer: Medicare Other | Admitting: Oncology

## 2016-08-03 ENCOUNTER — Other Ambulatory Visit (HOSPITAL_BASED_OUTPATIENT_CLINIC_OR_DEPARTMENT_OTHER): Payer: Medicare Other

## 2016-08-03 ENCOUNTER — Encounter: Payer: Self-pay | Admitting: Oncology

## 2016-08-03 VITALS — BP 133/95 | HR 105 | Temp 98.1°F | Resp 18 | Ht 62.0 in | Wt 232.2 lb

## 2016-08-03 DIAGNOSIS — D701 Agranulocytosis secondary to cancer chemotherapy: Secondary | ICD-10-CM | POA: Diagnosis not present

## 2016-08-03 DIAGNOSIS — C541 Malignant neoplasm of endometrium: Secondary | ICD-10-CM

## 2016-08-03 DIAGNOSIS — D6959 Other secondary thrombocytopenia: Secondary | ICD-10-CM

## 2016-08-03 DIAGNOSIS — T451X5A Adverse effect of antineoplastic and immunosuppressive drugs, initial encounter: Secondary | ICD-10-CM

## 2016-08-03 DIAGNOSIS — D6481 Anemia due to antineoplastic chemotherapy: Secondary | ICD-10-CM

## 2016-08-03 DIAGNOSIS — G620 Drug-induced polyneuropathy: Secondary | ICD-10-CM | POA: Diagnosis not present

## 2016-08-03 DIAGNOSIS — D696 Thrombocytopenia, unspecified: Secondary | ICD-10-CM

## 2016-08-03 DIAGNOSIS — Z95828 Presence of other vascular implants and grafts: Secondary | ICD-10-CM

## 2016-08-03 DIAGNOSIS — Z86718 Personal history of other venous thrombosis and embolism: Secondary | ICD-10-CM | POA: Diagnosis not present

## 2016-08-03 DIAGNOSIS — I10 Essential (primary) hypertension: Secondary | ICD-10-CM

## 2016-08-03 LAB — CBC WITH DIFFERENTIAL/PLATELET
BASO%: 0.1 % (ref 0.0–2.0)
Basophils Absolute: 0 10*3/uL (ref 0.0–0.1)
EOS ABS: 0 10*3/uL (ref 0.0–0.5)
EOS%: 0 % (ref 0.0–7.0)
HEMATOCRIT: 31.5 % — AB (ref 34.8–46.6)
HGB: 10.1 g/dL — ABNORMAL LOW (ref 11.6–15.9)
LYMPH#: 0.4 10*3/uL — AB (ref 0.9–3.3)
LYMPH%: 19.4 % (ref 14.0–49.7)
MCH: 32.9 pg (ref 25.1–34.0)
MCHC: 32.2 g/dL (ref 31.5–36.0)
MCV: 102.3 fL — AB (ref 79.5–101.0)
MONO#: 0 10*3/uL — AB (ref 0.1–0.9)
MONO%: 1.3 % (ref 0.0–14.0)
NEUT%: 79.2 % — AB (ref 38.4–76.8)
NEUTROS ABS: 1.5 10*3/uL (ref 1.5–6.5)
PLATELETS: 79 10*3/uL — AB (ref 145–400)
RBC: 3.08 10*6/uL — AB (ref 3.70–5.45)
RDW: 21.4 % — ABNORMAL HIGH (ref 11.2–14.5)
WBC: 1.9 10*3/uL — ABNORMAL LOW (ref 3.9–10.3)

## 2016-08-03 LAB — COMPREHENSIVE METABOLIC PANEL
ALK PHOS: 122 U/L (ref 40–150)
ALT: 14 U/L (ref 0–55)
ANION GAP: 11 meq/L (ref 3–11)
AST: 16 U/L (ref 5–34)
Albumin: 3.2 g/dL — ABNORMAL LOW (ref 3.5–5.0)
BILIRUBIN TOTAL: 0.49 mg/dL (ref 0.20–1.20)
BUN: 17.9 mg/dL (ref 7.0–26.0)
CALCIUM: 9.6 mg/dL (ref 8.4–10.4)
CO2: 22 meq/L (ref 22–29)
CREATININE: 0.9 mg/dL (ref 0.6–1.1)
Chloride: 104 mEq/L (ref 98–109)
EGFR: 60 mL/min/{1.73_m2} — AB (ref >90)
Glucose: 252 mg/dl — ABNORMAL HIGH (ref 70–140)
Potassium: 4.7 mEq/L (ref 3.5–5.1)
Sodium: 137 mEq/L (ref 136–145)
TOTAL PROTEIN: 6.3 g/dL — AB (ref 6.4–8.3)

## 2016-08-03 MED ORDER — SODIUM CHLORIDE 0.9 % IJ SOLN
10.0000 mL | INTRAMUSCULAR | Status: DC | PRN
  Administered 2016-08-03: 10 mL via INTRAVENOUS
  Filled 2016-08-03: qty 10

## 2016-08-03 NOTE — Progress Notes (Signed)
OFFICE PROGRESS NOTE   August 03, 2016   Physicians: Rise Paganini, MD; V.Mody, F.Alusio, R.Buccini, Alphonsa Overall  INTERVAL HISTORY:  Patient is seen, alone for visit, in continuing attention to adjuvant dose dense carboplatin taxol for IIIC1 high grade serous endometrial carcinoma. She has had delays for cytopenias despite granix presently days 2,3,9,16; she was transfused PRBCs x1 unit on 07-21-16.  Last treatment was day 8 cycle 4 on 07-20-16, with day 15 cycle 4 omitted due to neutropenia. She is thrombocytopenic today, platelets 79lk, so will delay planned day 1 cycle 5.  Carboplatin for cycles 5 and 6 has been dose reduced.   Patinet felt some better after PRBC transfusion on 07-21-16, tho she is still fatigued with exertion such as walking in office. She is not SOB at rest and has no chest pain. She has had no bleeding. She had GI upset after eating out, resolved. She has had no fever or symptoms of infection. PAC is functioning well. She denies new or different pain. No increased swelling LE.  No change in slight numbness tips of fingers. Remainder of 10 point Review of Systems negative.     PAC placed by IR 05-15-16 No genetics testing. Flu vaccine 06-29-16  ONCOLOGIC HISTORY Patient had one episode of heavy vaginal bleeding on 01-28-16, then two later episodes of spotting. She saw Dr Benjie Karvonen, with [place] and D&C 03-04-16 demonstrating high grade serous endometrial carcinoma ([id number]). She was referred to Dr Denman George, with CT CAP 03-24-16 with 20 mm endometrium, 2 mildly enlarged left external iliac nodes and a mildly enlarged left para aortic node, but without evidence of other distant disease. She had robotic total hysterectomy BSO and sentinel node evaluation by Dr Denman George on 03-26-16, with pathology [id number]) high grade serous carcinoma of endometrium extending into outer half of myometrium, + LVSI, involved left obturator sentinel node with 3 other sentinel nodes negative, ovaries  and tubes not involved. Post operative course was not remarkable. She completed 14 days of prophylactic lovenox postoperatively. SHe saw Dr Denman George in follow up on 04-20-16, doing well from surgery, recommendation for 6 cycles of carboplatin taxol and consideration of HDR at completion of chemo if doing well. Day 1 cycle 1 dose dense carbo taxol was given 05-04-16. She was neutropenic day 15 cycle 1, granix added. She had delays and missed doses thru cycle 4 despite increased granix and PRBC transfusion 07-21-16. Carbo dose reduced for cycle 5.    Objective:  Vital signs in last 24 hours:  BP (!) 133/95 (BP Location: Right Arm, Patient Position: Sitting)   Pulse (!) 105   Temp 98.1 F (36.7 C) (Oral)   Resp 18   Ht '5\' 2"'  (1.575 m)   Wt 232 lb 3.2 oz (105.3 kg)   SpO2 100%   BMI 42.47 kg/m  Weight down 1 lb. Pleasant, looks chronically ill, respirations not labored at rest, somewhat pale. Alert, oriented and appropriate. Ambulatory with assistive device and effort.  Alopecia  HEENT:PERRL, sclerae not icteric. Oral mucosa moist without lesions, posterior pharynx clear.  Neck supple. No JVD.  Lymphatics:no supraclavicular adenopathy Resp: clear to auscultation bilaterally and normal percussion bilaterally Cardio: regular rate and rhythm. No gallop. GI: abdomen obese, soft, nontender, cannot appreciate mass or organomegaly. Some bowel sounds. Musculoskeletal/ Extremities: LE without pitting edema, cords, tenderness Neuro: no peripheral neuropathy. Otherwise nonfocal Skin without rash, ecchymosis, petechiae Portacath-without erythema or tenderness  Lab Results:  Results for orders placed or performed in visit on 08/03/16  CBC  with Differential  Result Value Ref Range   WBC 1.9 (L) 3.9 - 10.3 10e3/uL   NEUT# 1.5 1.5 - 6.5 10e3/uL   HGB 10.1 (L) 11.6 - 15.9 g/dL   HCT 31.5 (L) 34.8 - 46.6 %   Platelets 79 (L) 145 - 400 10e3/uL   MCV 102.3 (H) 79.5 - 101.0 fL   MCH 32.9 25.1 - 34.0 pg    MCHC 32.2 31.5 - 36.0 g/dL   RBC 3.08 (L) 3.70 - 5.45 10e6/uL   RDW 21.4 (H) 11.2 - 14.5 %   lymph# 0.4 (L) 0.9 - 3.3 10e3/uL   MONO# 0.0 (L) 0.1 - 0.9 10e3/uL   Eosinophils Absolute 0.0 0.0 - 0.5 10e3/uL   Basophils Absolute 0.0 0.0 - 0.1 10e3/uL   NEUT% 79.2 (H) 38.4 - 76.8 %   LYMPH% 19.4 14.0 - 49.7 %   MONO% 1.3 0.0 - 14.0 %   EOS% 0.0 0.0 - 7.0 %   BASO% 0.1 0.0 - 2.0 %  Comprehensive metabolic panel  Result Value Ref Range   Sodium 137 136 - 145 mEq/L   Potassium 4.7 3.5 - 5.1 mEq/L   Chloride 104 98 - 109 mEq/L   CO2 22 22 - 29 mEq/L   Glucose 252 (H) 70 - 140 mg/dl   BUN 17.9 7.0 - 26.0 mg/dL   Creatinine 0.9 0.6 - 1.1 mg/dL   Total Bilirubin 0.49 0.20 - 1.20 mg/dL   Alkaline Phosphatase 122 40 - 150 U/L   AST 16 5 - 34 U/L   ALT 14 0 - 55 U/L   Total Protein 6.3 (L) 6.4 - 8.3 g/dL   Albumin 3.2 (L) 3.5 - 5.0 g/dL   Calcium 9.6 8.4 - 10.4 mg/dL   Anion Gap 11 3 - 11 mEq/L   EGFR 60 (L) >90 ml/min/1.73 m2     Studies/Results:  No results found.  Medications: I have reviewed the patient's current medications. On ferrous sulfate  DISCUSSION Will delay day 1 cycle 5 until [place] >=1.5 and platelets >=100k. Will check counts on 08-07-16 in case not recovered adequately to treat on 11-13 (would not take premedication steroids if too low to treat). Will decrease carboplatin from AUC =5 to AUC =4 for cycles 5 and 6.  With progressive difficulty tolerating chemo, may not be able to complete planned course, tho patient is still willing to try.   Assessment/Plan:   1. IIIC1 high grade serous endometrial carcinoma: post robotic assisted total hysterectomy BSO and sentinel node evaluation by Dr Denman George 03-26-16. Continuing dose dense carbo taxol, 6 cycles planned, and for consideration of HDR after completion of chemo if doing well. She has not had consultation with radiation oncology. 2.PAC in place 3.history LE DVT 20 years ago..  4.borderline DM. Expect transient  elevation of blood sugars with steroids for taxol, SS insulin in chemo orders.  5.degenerative disease and scoliosis LS with chronic back pain. Known to Dr Geanie Kenning replacements 6.obstructive sleep apnea on CPAP 7.urinary frequency with related sleep disturbance, long-standing.  8. Post lap chole and umbilical hernia repair. HH. GERD controlled on prilosce 9.HTN controlled. Elevated lipids. CAD by CT. Atherosclerosis thoracic and abdominal aorta on CT 10.hypothyroid on synthroid by Dr Alyson Ingles 11.up to date colonoscopy, no polyps per patient 12.morbid obesity 13.progressive anemia on chemo despite oral iron, likely also component of chronic disease. Improvement with 1 unit PRBCs on 07-21-16 14.flu vaccine 06-29-16 15.chemo peripheral neuropathy minimal, follow on taxol.  16..taxol peripheral neuropathy: still fairly minimal,  following.  All questions answered and she is in agreement with plans above, knows to call if bleeding or other concerns prior to next scheduled appointments. Chemo orders adjusted. Time spent 25 min including >50% counseling and coordination of care.     Evlyn Clines, MD   08/03/2016, 7:12 PM

## 2016-08-04 ENCOUNTER — Ambulatory Visit: Payer: Medicare Other

## 2016-08-04 DIAGNOSIS — T451X5A Adverse effect of antineoplastic and immunosuppressive drugs, initial encounter: Secondary | ICD-10-CM

## 2016-08-04 DIAGNOSIS — D6959 Other secondary thrombocytopenia: Secondary | ICD-10-CM | POA: Insufficient documentation

## 2016-08-05 ENCOUNTER — Ambulatory Visit: Payer: Medicare Other

## 2016-08-07 ENCOUNTER — Other Ambulatory Visit (HOSPITAL_BASED_OUTPATIENT_CLINIC_OR_DEPARTMENT_OTHER): Payer: Medicare Other

## 2016-08-07 ENCOUNTER — Telehealth: Payer: Self-pay

## 2016-08-07 DIAGNOSIS — C541 Malignant neoplasm of endometrium: Secondary | ICD-10-CM

## 2016-08-07 LAB — CBC WITH DIFFERENTIAL/PLATELET
BASO%: 0.2 % (ref 0.0–2.0)
BASOS ABS: 0 10*3/uL (ref 0.0–0.1)
EOS ABS: 0 10*3/uL (ref 0.0–0.5)
EOS%: 0.6 % (ref 0.0–7.0)
HCT: 29.6 % — ABNORMAL LOW (ref 34.8–46.6)
HEMOGLOBIN: 9.6 g/dL — AB (ref 11.6–15.9)
LYMPH%: 20.5 % (ref 14.0–49.7)
MCH: 33.1 pg (ref 25.1–34.0)
MCHC: 32.4 g/dL (ref 31.5–36.0)
MCV: 102.1 fL — AB (ref 79.5–101.0)
MONO#: 0.4 10*3/uL (ref 0.1–0.9)
MONO%: 7.8 % (ref 0.0–14.0)
NEUT%: 70.9 % (ref 38.4–76.8)
NEUTROS ABS: 3.6 10*3/uL (ref 1.5–6.5)
PLATELETS: 118 10*3/uL — AB (ref 145–400)
RBC: 2.9 10*6/uL — ABNORMAL LOW (ref 3.70–5.45)
RDW: 19.4 % — AB (ref 11.2–14.5)
WBC: 5.1 10*3/uL (ref 3.9–10.3)
lymph#: 1.1 10*3/uL (ref 0.9–3.3)

## 2016-08-07 NOTE — Telephone Encounter (Signed)
Told ms Pasquarelli that her ANC= 3.6 and Plts are 118 today.  She is fine for treatment on 08-10-16. Pt pleased to be able to get treatment.

## 2016-08-10 ENCOUNTER — Other Ambulatory Visit (HOSPITAL_BASED_OUTPATIENT_CLINIC_OR_DEPARTMENT_OTHER): Payer: Medicare Other

## 2016-08-10 ENCOUNTER — Ambulatory Visit (HOSPITAL_BASED_OUTPATIENT_CLINIC_OR_DEPARTMENT_OTHER): Payer: Medicare Other

## 2016-08-10 VITALS — BP 106/70 | HR 93 | Temp 97.8°F | Resp 16

## 2016-08-10 DIAGNOSIS — C541 Malignant neoplasm of endometrium: Secondary | ICD-10-CM

## 2016-08-10 DIAGNOSIS — Z5111 Encounter for antineoplastic chemotherapy: Secondary | ICD-10-CM | POA: Diagnosis not present

## 2016-08-10 DIAGNOSIS — R7303 Prediabetes: Secondary | ICD-10-CM

## 2016-08-10 LAB — CBC WITH DIFFERENTIAL/PLATELET
BASO%: 0 % (ref 0.0–2.0)
Basophils Absolute: 0 10*3/uL (ref 0.0–0.1)
EOS ABS: 0 10*3/uL (ref 0.0–0.5)
EOS%: 0 % (ref 0.0–7.0)
HCT: 29.4 % — ABNORMAL LOW (ref 34.8–46.6)
HGB: 9.7 g/dL — ABNORMAL LOW (ref 11.6–15.9)
LYMPH%: 7.3 % — AB (ref 14.0–49.7)
MCH: 33.8 pg (ref 25.1–34.0)
MCHC: 33 g/dL (ref 31.5–36.0)
MCV: 102.4 fL — AB (ref 79.5–101.0)
MONO#: 0 10*3/uL — ABNORMAL LOW (ref 0.1–0.9)
MONO%: 0.2 % (ref 0.0–14.0)
NEUT%: 92.5 % — ABNORMAL HIGH (ref 38.4–76.8)
NEUTROS ABS: 3.9 10*3/uL (ref 1.5–6.5)
Platelets: 176 10*3/uL (ref 145–400)
RBC: 2.87 10*6/uL — AB (ref 3.70–5.45)
RDW: 18.9 % — ABNORMAL HIGH (ref 11.2–14.5)
WBC: 4.2 10*3/uL (ref 3.9–10.3)
lymph#: 0.3 10*3/uL — ABNORMAL LOW (ref 0.9–3.3)

## 2016-08-10 LAB — COMPREHENSIVE METABOLIC PANEL
ALT: 12 U/L (ref 0–55)
AST: 15 U/L (ref 5–34)
Albumin: 3 g/dL — ABNORMAL LOW (ref 3.5–5.0)
Alkaline Phosphatase: 117 U/L (ref 40–150)
Anion Gap: 10 mEq/L (ref 3–11)
BUN: 22.6 mg/dL (ref 7.0–26.0)
CO2: 24 meq/L (ref 22–29)
Calcium: 9.9 mg/dL (ref 8.4–10.4)
Chloride: 105 mEq/L (ref 98–109)
Creatinine: 0.9 mg/dL (ref 0.6–1.1)
EGFR: 62 mL/min/{1.73_m2} — AB (ref >90)
Glucose: 247 mg/dl — ABNORMAL HIGH (ref 70–140)
POTASSIUM: 4.6 meq/L (ref 3.5–5.1)
SODIUM: 139 meq/L (ref 136–145)
Total Bilirubin: 0.4 mg/dL (ref 0.20–1.20)
Total Protein: 6.1 g/dL — ABNORMAL LOW (ref 6.4–8.3)

## 2016-08-10 MED ORDER — HEPARIN SOD (PORK) LOCK FLUSH 100 UNIT/ML IV SOLN
500.0000 [IU] | Freq: Once | INTRAVENOUS | Status: AC | PRN
  Administered 2016-08-10: 500 [IU]
  Filled 2016-08-10: qty 5

## 2016-08-10 MED ORDER — DIPHENHYDRAMINE HCL 50 MG/ML IJ SOLN
25.0000 mg | Freq: Once | INTRAMUSCULAR | Status: AC
  Administered 2016-08-10: 25 mg via INTRAVENOUS

## 2016-08-10 MED ORDER — FAMOTIDINE IN NACL 20-0.9 MG/50ML-% IV SOLN
INTRAVENOUS | Status: AC
  Filled 2016-08-10: qty 50

## 2016-08-10 MED ORDER — FAMOTIDINE IN NACL 20-0.9 MG/50ML-% IV SOLN
20.0000 mg | Freq: Once | INTRAVENOUS | Status: AC
  Administered 2016-08-10: 20 mg via INTRAVENOUS

## 2016-08-10 MED ORDER — DIPHENHYDRAMINE HCL 50 MG/ML IJ SOLN
INTRAMUSCULAR | Status: AC
  Filled 2016-08-10: qty 1

## 2016-08-10 MED ORDER — SODIUM CHLORIDE 0.9% FLUSH
10.0000 mL | INTRAVENOUS | Status: DC | PRN
  Administered 2016-08-10: 10 mL
  Filled 2016-08-10: qty 10

## 2016-08-10 MED ORDER — SODIUM CHLORIDE 0.9 % IV SOLN
Freq: Once | INTRAVENOUS | Status: AC
  Administered 2016-08-10: 15:00:00 via INTRAVENOUS

## 2016-08-10 MED ORDER — ONDANSETRON HCL 8 MG PO TABS
8.0000 mg | ORAL_TABLET | Freq: Once | ORAL | Status: AC
  Administered 2016-08-10: 8 mg via ORAL

## 2016-08-10 MED ORDER — PACLITAXEL CHEMO INJECTION 300 MG/50ML
80.0000 mg/m2 | Freq: Once | INTRAVENOUS | Status: AC
  Administered 2016-08-10: 174 mg via INTRAVENOUS
  Filled 2016-08-10: qty 29

## 2016-08-10 MED ORDER — SODIUM CHLORIDE 0.9 % IV SOLN
404.8000 mg | Freq: Once | INTRAVENOUS | Status: AC
  Administered 2016-08-10: 400 mg via INTRAVENOUS
  Filled 2016-08-10: qty 40

## 2016-08-10 MED ORDER — INSULIN REGULAR HUMAN 100 UNIT/ML IJ SOLN
2.0000 [IU] | Freq: Once | INTRAMUSCULAR | Status: AC | PRN
  Administered 2016-08-10: 6 [IU] via SUBCUTANEOUS
  Filled 2016-08-10: qty 0.08

## 2016-08-10 MED ORDER — ONDANSETRON HCL 8 MG PO TABS
ORAL_TABLET | ORAL | Status: AC
  Filled 2016-08-10: qty 1

## 2016-08-10 MED ORDER — SODIUM CHLORIDE 0.9 % IV SOLN
Freq: Once | INTRAVENOUS | Status: AC
  Administered 2016-08-10: 16:00:00 via INTRAVENOUS
  Filled 2016-08-10: qty 5

## 2016-08-10 NOTE — Patient Instructions (Signed)
[facility] Discharge Instructions for Patients Receiving Chemotherapy  Today you received the following chemotherapy agents :  Taxol,  Carboplatin.  To help prevent nausea and vomiting after your treatment, we encourage you to take your nausea medication as prescribed.   If you develop nausea and vomiting that is not controlled by your nausea medication, call the clinic.   BELOW ARE SYMPTOMS THAT SHOULD BE REPORTED IMMEDIATELY:  *FEVER GREATER THAN 100.5 F  *CHILLS WITH OR WITHOUT FEVER  NAUSEA AND VOMITING THAT IS NOT CONTROLLED WITH YOUR NAUSEA MEDICATION  *UNUSUAL SHORTNESS OF BREATH  *UNUSUAL BRUISING OR BLEEDING  TENDERNESS IN MOUTH AND THROAT WITH OR WITHOUT PRESENCE OF ULCERS  *URINARY PROBLEMS  *BOWEL PROBLEMS  UNUSUAL RASH Items with * indicate a potential emergency and should be followed up as soon as possible.  Feel free to call the clinic you have any questions or concerns. The clinic phone number is [phone].  Please show the CHEMO ALERT CARD at check-in to the Emergency Department and triage nurse.   

## 2016-08-11 ENCOUNTER — Ambulatory Visit (HOSPITAL_BASED_OUTPATIENT_CLINIC_OR_DEPARTMENT_OTHER): Payer: Medicare Other

## 2016-08-11 VITALS — BP 105/66 | HR 100 | Temp 98.1°F | Resp 22

## 2016-08-11 DIAGNOSIS — D701 Agranulocytosis secondary to cancer chemotherapy: Secondary | ICD-10-CM

## 2016-08-11 DIAGNOSIS — C541 Malignant neoplasm of endometrium: Secondary | ICD-10-CM

## 2016-08-11 MED ORDER — TBO-FILGRASTIM 480 MCG/0.8ML ~~LOC~~ SOSY
480.0000 ug | PREFILLED_SYRINGE | Freq: Once | SUBCUTANEOUS | Status: AC
  Administered 2016-08-11: 480 ug via SUBCUTANEOUS
  Filled 2016-08-11: qty 0.8

## 2016-08-11 NOTE — Patient Instructions (Signed)

## 2016-08-16 ENCOUNTER — Other Ambulatory Visit: Payer: Self-pay | Admitting: Oncology

## 2016-08-17 ENCOUNTER — Ambulatory Visit: Payer: Medicare Other

## 2016-08-17 ENCOUNTER — Ambulatory Visit (HOSPITAL_BASED_OUTPATIENT_CLINIC_OR_DEPARTMENT_OTHER): Payer: Medicare Other | Admitting: Oncology

## 2016-08-17 ENCOUNTER — Ambulatory Visit (HOSPITAL_COMMUNITY)
Admission: RE | Admit: 2016-08-17 | Discharge: 2016-08-17 | Disposition: A | Discharge status: Home / Self Care | Payer: Medicare Other | Source: Ambulatory Visit | Attending: Oncology | Admitting: Oncology

## 2016-08-17 ENCOUNTER — Ambulatory Visit (HOSPITAL_BASED_OUTPATIENT_CLINIC_OR_DEPARTMENT_OTHER): Payer: Medicare Other

## 2016-08-17 ENCOUNTER — Other Ambulatory Visit (HOSPITAL_BASED_OUTPATIENT_CLINIC_OR_DEPARTMENT_OTHER): Payer: Medicare Other

## 2016-08-17 VITALS — BP 125/66 | HR 91 | Temp 98.0°F | Resp 18 | Ht 62.0 in | Wt 238.7 lb

## 2016-08-17 DIAGNOSIS — T451X5A Adverse effect of antineoplastic and immunosuppressive drugs, initial encounter: Secondary | ICD-10-CM

## 2016-08-17 DIAGNOSIS — Z95828 Presence of other vascular implants and grafts: Secondary | ICD-10-CM

## 2016-08-17 DIAGNOSIS — G620 Drug-induced polyneuropathy: Secondary | ICD-10-CM

## 2016-08-17 DIAGNOSIS — C541 Malignant neoplasm of endometrium: Secondary | ICD-10-CM

## 2016-08-17 DIAGNOSIS — D6481 Anemia due to antineoplastic chemotherapy: Secondary | ICD-10-CM

## 2016-08-17 DIAGNOSIS — Z86718 Personal history of other venous thrombosis and embolism: Secondary | ICD-10-CM | POA: Diagnosis not present

## 2016-08-17 DIAGNOSIS — I10 Essential (primary) hypertension: Secondary | ICD-10-CM | POA: Diagnosis not present

## 2016-08-17 DIAGNOSIS — K219 Gastro-esophageal reflux disease without esophagitis: Secondary | ICD-10-CM

## 2016-08-17 LAB — CBC WITH DIFFERENTIAL/PLATELET
BASO%: 0.2 % (ref 0.0–2.0)
BASOS ABS: 0 10*3/uL (ref 0.0–0.1)
EOS ABS: 0 10*3/uL (ref 0.0–0.5)
EOS%: 0 % (ref 0.0–7.0)
HCT: 28.1 % — ABNORMAL LOW (ref 34.8–46.6)
HEMOGLOBIN: 9 g/dL — AB (ref 11.6–15.9)
LYMPH%: 11.9 % — AB (ref 14.0–49.7)
MCH: 33.3 pg (ref 25.1–34.0)
MCHC: 31.9 g/dL (ref 31.5–36.0)
MCV: 104.2 fL — AB (ref 79.5–101.0)
MONO#: 0.1 10*3/uL (ref 0.1–0.9)
MONO%: 2.8 % (ref 0.0–14.0)
NEUT%: 85.1 % — ABNORMAL HIGH (ref 38.4–76.8)
NEUTROS ABS: 2.6 10*3/uL (ref 1.5–6.5)
PLATELETS: 289 10*3/uL (ref 145–400)
RBC: 2.7 10*6/uL — ABNORMAL LOW (ref 3.70–5.45)
RDW: 19.8 % — AB (ref 11.2–14.5)
WBC: 3.1 10*3/uL — AB (ref 3.9–10.3)
lymph#: 0.4 10*3/uL — ABNORMAL LOW (ref 0.9–3.3)

## 2016-08-17 LAB — COMPREHENSIVE METABOLIC PANEL
ALBUMIN: 3.1 g/dL — AB (ref 3.5–5.0)
ALK PHOS: 107 U/L (ref 40–150)
ALT: 16 U/L (ref 0–55)
ANION GAP: 9 meq/L (ref 3–11)
AST: 16 U/L (ref 5–34)
BILIRUBIN TOTAL: 0.34 mg/dL (ref 0.20–1.20)
BUN: 20.6 mg/dL (ref 7.0–26.0)
CO2: 23 mEq/L (ref 22–29)
Calcium: 9.8 mg/dL (ref 8.4–10.4)
Chloride: 104 mEq/L (ref 98–109)
Creatinine: 0.8 mg/dL (ref 0.6–1.1)
EGFR: 66 mL/min/{1.73_m2} — AB (ref >90)
Glucose: 220 mg/dl — ABNORMAL HIGH (ref 70–140)
POTASSIUM: 4.6 meq/L (ref 3.5–5.1)
Sodium: 136 mEq/L (ref 136–145)
TOTAL PROTEIN: 6 g/dL — AB (ref 6.4–8.3)

## 2016-08-17 LAB — PREPARE RBC (CROSSMATCH)

## 2016-08-17 MED ORDER — ACETAMINOPHEN 325 MG PO TABS
325.0000 mg | ORAL_TABLET | Freq: Once | ORAL | Status: AC
  Administered 2016-08-17: 325 mg via ORAL

## 2016-08-17 MED ORDER — SODIUM CHLORIDE 0.9 % IJ SOLN
10.0000 mL | INTRAMUSCULAR | Status: DC | PRN
  Administered 2016-08-17: 10 mL via INTRAVENOUS
  Filled 2016-08-17: qty 10

## 2016-08-17 MED ORDER — SODIUM CHLORIDE 0.9% FLUSH
10.0000 mL | INTRAVENOUS | Status: AC | PRN
  Administered 2016-08-17: 10 mL
  Filled 2016-08-17: qty 10

## 2016-08-17 MED ORDER — HEPARIN SOD (PORK) LOCK FLUSH 100 UNIT/ML IV SOLN
250.0000 [IU] | INTRAVENOUS | Status: DC | PRN
  Filled 2016-08-17: qty 5

## 2016-08-17 MED ORDER — ACETAMINOPHEN 325 MG PO TABS
ORAL_TABLET | ORAL | Status: AC
  Filled 2016-08-17: qty 1

## 2016-08-17 MED ORDER — HEPARIN SOD (PORK) LOCK FLUSH 100 UNIT/ML IV SOLN
500.0000 [IU] | Freq: Every day | INTRAVENOUS | Status: DC | PRN
  Filled 2016-08-17: qty 5

## 2016-08-17 MED ORDER — SODIUM CHLORIDE 0.9 % IV SOLN
250.0000 mL | Freq: Once | INTRAVENOUS | Status: AC
  Administered 2016-08-17: 250 mL via INTRAVENOUS

## 2016-08-17 MED ORDER — SODIUM CHLORIDE 0.9% FLUSH
3.0000 mL | INTRAVENOUS | Status: DC | PRN
  Filled 2016-08-17: qty 10

## 2016-08-17 NOTE — Patient Instructions (Signed)

## 2016-08-17 NOTE — Patient Instructions (Signed)

## 2016-08-17 NOTE — Progress Notes (Signed)
OFFICE PROGRESS NOTE   August 17, 2016   Physicians: Rise Paganini, MD; V.Mody, F.Alusio, R.Buccini, Alphonsa Overall  INTERVAL HISTORY:  Patient is seen, alone for visit, in continuing attention to adjuvant chemotherapy for IIIC1 high grade serous endometrial carcinoma. She has had an increasingly difficult time tolerating treatment, with cytopenias and marked fatigue. Day 15 cycle 4 was omitted due to neutropenia despite gCSF; day 1 cycle 5 carboplatin was delayed for a week due to thrombocytopenia and dose reduced when this was given on 08-10-16 . She was transfused PRBCs on 07-21-16, which did seem helpful.  Patient presents today for day 8 cycle 5, however has been much more SOB with minimal exertion for past 5 days, and is somewhat SOB just seated in exam room now. She denies chest pain or palpitations, any overt bleeding, cough, fever. She did not take usual lasix this AM prior to coming to office. Appetite is ok, no significant nausea and no vomiting. No abdominal pain. Bowels are moving. She has no peripheral neuropathy. She is up and down to void thru night. She denies any different pain in LE. No problems with PAC. She did take premedication steroids prior to planned taxol today. Remainder of 10 point Review of Systems negative.  PAC placed by IR 05-15-16 No genetics testing. Flu vaccine 06-29-16  Form completed by MD for cancellation of planned trip due to medical situation, returned to patient now.  ONCOLOGIC HISTORY Patient had one episode of heavy vaginal bleeding on 01-28-16, then two later episodes of spotting. She saw Dr Benjie Karvonen, with [place] and D&C 03-04-16 demonstrating high grade serous endometrial carcinoma ([id number]). She was referred to Dr Denman George, with CT CAP 03-24-16 with 20 mm endometrium, 2 mildly enlarged left external iliac nodes and a mildly enlarged left para aortic node, but without evidence of other distant disease. She had robotic total hysterectomy BSO and sentinel  node evaluation by Dr Denman George on 03-26-16, with pathology [phone]) high grade serous carcinoma of endometrium extending into outer half of myometrium, + LVSI, involved left obturator sentinel node with 3 other sentinel nodes negative, ovaries and tubes not involved. Post operative course was not remarkable. She completed 14 days of prophylactic lovenox postoperatively. SHe saw Dr Denman George in follow up on 04-20-16, doing well from surgery, recommendation for 6 cycles of carboplatin taxol and consideration of HDR at completion of chemo if doing well. Day 1 cycle 1 dose dense carbo taxol was given 05-04-16. She was neutropenic day 15 cycle 1, granix added. She had delays and missed doses thru cycle 4 despite increased granix and PRBC transfusion 07-21-16. Carbo dose reduced for day 1 cycle 5 after delay for thrombocytopenia, given on 08-10-16.   Objective:  Vital signs in last 24 hours:  BP 125/66 (BP Location: Left Arm, Patient Position: Sitting)   Pulse 91   Temp 98 F (36.7 C) (Oral)   Resp 18   Ht _0  (1.575 m)   Wt 238 lb 11.2 oz (108.3 kg)   SpO2 98%   BMI 43.66 kg/m  Weight up 6 lbs from 08-03-16.  Alert, oriented and appropriate. Ambulatory with difficulty using assistive device, dyspneic walking short distance from exam room to BR; used WC subsequently Alopecia  HEENT:PERRL, sclerae not icteric. Oral mucosa moist without lesions, posterior pharynx clear.  Neck supple. No JVD Lymphatics:no cervical,supraclavicular adenopathy Resp: lungs without wheezes or rales, diminished BS in bases, a little moreso right base than left, slightly dull to percussion bases bilaterally which may be  habitus. Cardio: regular rate and rhythm. No gallop. Clear heart sounds. GI: abdomen obese, soft, nontender, cannot appreciate mass or organomegaly. Some bowel sounds. Surgical incisions not remarkable. Cannot tell any fluid wave. Musculoskeletal/ Extremities: lower legs 1+  edema, without cords,  tenderness Neuro: no peripheral neuropathy. Otherwise nonfocal. PSYCH appropriate mood and affect Skin without rash, ecchymosis, petechiae Portacath-without erythema or tenderness  Lab Results:  Results for orders placed or performed in visit on 08/17/16  CBC with Differential  Result Value Ref Range   WBC 3.1 (L) 3.9 - 10.3 10e3/uL   NEUT# 2.6 1.5 - 6.5 10e3/uL   HGB 9.0 (L) 11.6 - 15.9 g/dL   HCT 28.1 (L) 34.8 - 46.6 %   Platelets 289 145 - 400 10e3/uL   MCV 104.2 (H) 79.5 - 101.0 fL   MCH 33.3 25.1 - 34.0 pg   MCHC 31.9 31.5 - 36.0 g/dL   RBC 2.70 (L) 3.70 - 5.45 10e6/uL   RDW 19.8 (H) 11.2 - 14.5 %   lymph# 0.4 (L) 0.9 - 3.3 10e3/uL   MONO# 0.1 0.1 - 0.9 10e3/uL   Eosinophils Absolute 0.0 0.0 - 0.5 10e3/uL   Basophils Absolute 0.0 0.0 - 0.1 10e3/uL   NEUT% 85.1 (H) 38.4 - 76.8 %   LYMPH% 11.9 (L) 14.0 - 49.7 %   MONO% 2.8 0.0 - 14.0 %   EOS% 0.0 0.0 - 7.0 %   BASO% 0.2 0.0 - 2.0 %  Comprehensive metabolic panel  Result Value Ref Range   Sodium 136 136 - 145 mEq/L   Potassium 4.6 3.5 - 5.1 mEq/L   Chloride 104 98 - 109 mEq/L   CO2 23 22 - 29 mEq/L   Glucose 220 (H) 70 - 140 mg/dl   BUN 20.6 7.0 - 26.0 mg/dL   Creatinine 0.8 0.6 - 1.1 mg/dL   Total Bilirubin 0.34 0.20 - 1.20 mg/dL   Alkaline Phosphatase 107 40 - 150 U/L   AST 16 5 - 34 U/L   ALT 16 0 - 55 U/L   Total Protein 6.0 (L) 6.4 - 8.3 g/dL   Albumin 3.1 (L) 3.5 - 5.0 g/dL   Calcium 9.8 8.4 - 10.4 mg/dL   Anion Gap 9 3 - 11 mEq/L   EGFR 66 (L) >90 ml/min/1.73 m2     Studies/Results:  No results found.  Medications: I have reviewed the patient's current medications. Patient is instructed to take lasix when she returns home  DISCUSSION Interval history reviewed.  She seems more symptomatic from recurrent anemia, tho may be other contributing factors, and is having progressive difficulty tolerating treatment, with decreasing PS and cytopenias requiring multiple delays and dose reductions. We have agreed  that she is not tolerating the chemotherapy adequately to continue, so will stop now. She has had treatment thru cycle 5 day 1 carbo taxol on 08-10-16.  Patient is very relieved and in full agreement with plan above.  She agrees to transfusion 1 unit PRBCs now, which can be done instead of planned chemo today. She will take today's dose of lasix when she returns home today. If breathing is not better with these interventions, will need CXR and may also need to see PCP.   I will see her with labs in 2-3 weeks.   Communication with gyn oncology for follow up with Dr Denman George, and will set up repeat CTs shortly prior to that visit. I have also sent message to Dr Denman George, as initial recommendations (see Dr Serita Grit note 04-20-16)  were to consider vaginal brachytherapy depending on how patient tolerated chemotherapy. Note she has not had consultation with radiation oncology as yet. I did not discuss possible HDR with patient now.    Scheduling message sent: cancel chemo today/ 1 unit PRBCs instead : DONE  cancel injection 11-21  LL + lab from [facility] 12-14  Dr Denman George 12-27 at 1:30 for MD      Assessment/Plan:  1. IIIC1 high grade serous endometrial carcinoma: post robotic assisted total hysterectomy BSO and sentinel node evaluation by Dr Denman George 03-26-16. With progressive difficulty tolerating chemotherapy, will not give further adjuvant treatment now. Will let Dr Denman George know as consideration of HDR after completion of chemo (depending on tolerance). She has not had consultation with radiation oncology. Follow up as above. 2. progressive anemia: chemo,  chronic disease.  PRBCs on 07-21-16. Symptomatic again, hgb down from 9.7 last week to 9.0 today. Transfuse 1 unit PRBCs also today 3.history LE DVT 20 years ago. No clear concern for clots now 4.borderline DM. Expect transient elevation of blood sugars with steroids for taxol, SS insulin in chemo orders.  5.degenerative disease and scoliosis LS with chronic  back pain. Known to Dr Geanie Kenning replacements 6.obstructive sleep apnea on CPAP 7.urinary frequency with related sleep disturbance, long-standing.  8. Post lap chole and umbilical hernia repair. HH. GERD controlled on prilosce 9.HTN controlled. Elevated lipids. CAD by CT. Atherosclerosis thoracic and abdominal aorta on CT 10.hypothyroid on synthroid by Dr Alyson Ingles 11.up to date colonoscopy, no polyps per patient 12.morbid obesity 13.PAC in place. Will need to flush every 6-8 weeks when not otherwise used, should be used for restaging CT 14.flu vaccine 06-29-16 15.taxol peripheral neuropathy: none significant   All questions answered. Communication with infusion and with gyn oncology. Transfusion orders done. Time spent 30 min including >50% counseling and coordination of care. Cc Drs Alyson Ingles, Leone Payor, MD   08/17/2016, 3:16 PM

## 2016-08-18 ENCOUNTER — Ambulatory Visit: Payer: Medicare Other

## 2016-08-18 ENCOUNTER — Encounter: Payer: Self-pay | Admitting: Oncology

## 2016-08-18 LAB — TYPE AND SCREEN
ABO/RH(D): A NEG
Antibody Screen: NEGATIVE
Unit division: 0

## 2016-08-18 NOTE — Progress Notes (Signed)
[facility] END OF TREATMENT   Name: Melinda Kerr Date: August 18, 2016  MRN: [id number] DOB: 01-19-1935   TREATMENT DATES: 05-04-16 thru 08-10-16   REFERRING PHYSICIAN: E.Rossi  DIAGNOSIS: high grade serous endometrial carcinoma  STAGE AT START OF TREATMENT: IIIC1   INTENT curative   DRUGS OR REGIMENS GIVEN: dose dense carbo taxol   MAJOR TOXICITIES: fatigue, neutropenia, anemia, thrombocytopenia    REASON TREATMENT STOPPED:  Unable to tolerate   PERFORMANCE STATUS AT END: 2   ONGOING PROBLEMS: fatigue, symptomatic anemia   FOLLOW UP PLANS: restaging scans, consideration of vaginal brachytherapy, follow up blood counts, reevaluation by gyn oncology

## 2016-08-21 ENCOUNTER — Telehealth: Payer: Self-pay

## 2016-08-21 NOTE — Telephone Encounter (Signed)
-----   Message from Gordy Levan, MD sent at 08/18/2016  7:56 AM EST ----- Regarding: SOB Please check on her by phone later this week If breathing does not get better after PRBCs on 11-20 and back on her fluid pill, she should have CXR and/ or see PCP Dr Alyson Ingles  Thank you

## 2016-08-21 NOTE — Telephone Encounter (Signed)
Melinda Kerr stated that her breathing is better. It usually takes a full week after a transfusion to fulling has symptoms resolve.  She is coming to the [facility] on Monday 08-24-16 for a massage.  She is to let us know how she is feeling then and determine if she needs a CXR and or F/u with PCP as noted below by Dr. Marko Plume. Melinda Syrett vetbalized understanding.

## 2016-08-24 ENCOUNTER — Ambulatory Visit: Payer: Medicare Other

## 2016-08-24 ENCOUNTER — Ambulatory Visit (HOSPITAL_COMMUNITY)
Admission: RE | Admit: 2016-08-24 | Discharge: 2016-08-24 | Disposition: A | Discharge status: Home / Self Care | Payer: Medicare Other | Source: Ambulatory Visit | Attending: Oncology | Admitting: Oncology

## 2016-08-24 ENCOUNTER — Other Ambulatory Visit: Payer: Self-pay | Admitting: Oncology

## 2016-08-24 ENCOUNTER — Other Ambulatory Visit: Payer: Self-pay

## 2016-08-24 ENCOUNTER — Other Ambulatory Visit: Payer: Medicare Other

## 2016-08-24 DIAGNOSIS — C541 Malignant neoplasm of endometrium: Secondary | ICD-10-CM | POA: Diagnosis not present

## 2016-08-24 DIAGNOSIS — R0602 Shortness of breath: Secondary | ICD-10-CM | POA: Diagnosis not present

## 2016-08-24 DIAGNOSIS — I700 Atherosclerosis of aorta: Secondary | ICD-10-CM | POA: Insufficient documentation

## 2016-08-24 DIAGNOSIS — K449 Diaphragmatic hernia without obstruction or gangrene: Secondary | ICD-10-CM | POA: Diagnosis not present

## 2016-08-24 DIAGNOSIS — I517 Cardiomegaly: Secondary | ICD-10-CM | POA: Insufficient documentation

## 2016-08-24 NOTE — Telephone Encounter (Signed)
Spoke with Melinda Kerr and she states that her  breathing has not improved after blood transfusion and resumption of her fluid pill. Sent Melinda Kerr to get a CXR 2 view today as noted below by Dr. Marko Plume.  Will call patient with results of x-ray.

## 2016-08-24 NOTE — Telephone Encounter (Signed)
  Marieke, Anne Female, 81 y.o., [birth year] Weight:  238 lb 11.2 oz (108.3 kg) Phone:  [phone] PCP:  Maury Dus, MD MRN:  [id number] MyChart:  Pending Next Appt:  09/10/2016 Message  Received: Today  Message Contents  Gordy Levan, MD  Baruch Merl, RN        Please let her know no acute findings on CXR. Her heart is mildly enlarged, stable, and she may have slight pulmonary edema, also stable, and I know she is already on daily lasix.  She probably needs just more recovery time out from chemo. If SOB much worse or chest pain, she would need to be seen by MD, or in ED if acute problems.  It would be good for her to see PCP Dr Alyson Ingles in next couple of weeks, to let him check on her also.   Keep apt here as planned on 12-14.   thanks

## 2016-08-24 NOTE — Telephone Encounter (Signed)
LM for Melinda Kerr that this nurse will call her tomorrow regarding her CXR results from today 08-24-16.

## 2016-08-25 NOTE — Telephone Encounter (Signed)
Spoke with Melinda Kerr and reviewed the results of the CXR as noted below by Dr. Marko Plume. Melinda Kerr is scheduled to see Dr. Alyson Ingles on 09-14-16.

## 2016-08-27 ENCOUNTER — Telehealth: Payer: Self-pay

## 2016-08-27 NOTE — Telephone Encounter (Signed)
Pt called stating she has a blister on the end of her toe. She has been using neosporin and bandaid. Asking what else she needs to do. S/w pt and she described an area of abrasion to the tip of the right big toe. It has been present for about 3 weeks. It came when pt started wearing socks for winter. It is red and tender. No bleeding, no oozing. It is soft. It does not go into joint area. She describes having to pull on tip of sock to pull it away from toe. S/w Dr Marko Plume and she is planning on calling pt.

## 2016-08-28 ENCOUNTER — Other Ambulatory Visit: Payer: Self-pay | Admitting: Oncology

## 2016-08-28 ENCOUNTER — Encounter: Payer: Self-pay | Admitting: Radiation Oncology

## 2016-08-28 ENCOUNTER — Telehealth: Payer: Self-pay | Admitting: Oncology

## 2016-08-28 DIAGNOSIS — C541 Malignant neoplasm of endometrium: Secondary | ICD-10-CM

## 2016-08-28 NOTE — Telephone Encounter (Signed)
Medical Oncology  LM for patient on home phone, asking her to call back to RN line, either to get MD to phone then or to leave [place] message re time that MD can speak with her by phone.  1.Dr Denman George would like her to see Dr Sondra Come re vaginal brachytherapy, hopefully prior to next visit to Dr Denman George 12-27. I have spoken directly to rad onc scheduling, appointment made for Dec 11 arrive 2:45 for 3:00 apt. Patient needs to know this recommendation and the appointment as above.  2.needs CTs prior to Dr Denman George, and probably would be helpful prior to rad onc. I have put that order into EMR for CT shortly prior to 09-07-16.  3.follow up great toe abrasion, as she had spoken with RN yesterday. Not diabetic. Would stop using neosporin if not helpful. Could see PCP if does not improve. Avoid contact / trauma. May need to leave open to air some.  Godfrey Pick, MD

## 2016-08-28 NOTE — Telephone Encounter (Signed)
sw pt about Dr Mariana Kaufman attached message. Inbasket sent to [facility] for prior auth of CT

## 2016-08-29 ENCOUNTER — Other Ambulatory Visit: Payer: Self-pay | Admitting: Oncology

## 2016-08-31 ENCOUNTER — Telehealth: Payer: Self-pay

## 2016-08-31 ENCOUNTER — Other Ambulatory Visit: Payer: Medicare Other

## 2016-08-31 ENCOUNTER — Ambulatory Visit: Payer: Medicare Other

## 2016-08-31 NOTE — Telephone Encounter (Signed)
-----   Message from Gaspar Bidding sent at 08/28/2016  3:50 PM EST ----- Regarding: RE: PA for CT Referral authorized ----- Message ----- From: Janace Hoard, RN Sent: 08/28/2016   2:55 PM To: Gaspar Bidding Subject: PA for CT                                      Pt need PA for CT. Desiring to have CT done this upcoming week

## 2016-08-31 NOTE — Telephone Encounter (Signed)
lvm with radiology central scheduling phone number for her to make her CT appt.

## 2016-09-04 NOTE — Progress Notes (Signed)
GYN Location of Tumor / Histology: IIIC1 high grade serous endometrial carcinoma  Melinda Kerr presented with symptoms of: had one episode of heavyvaginal bleeding on 01-28-16, then two later episodes of spotting.   Biopsies revealed:   03/26/16 Diagnosis 1. Lymph node, sentinel, biopsy, right obturator - ONE BENIGN LYMPH NODE (0/1). 2. Lymph node, sentinel, biopsy, left external iliac - ONE BENIGN LYMPH NODE (0/1). 3. Lymph node, sentinel, biopsy, left obturator - METASTATIC CARCINOMA IN ONE LYMPH NODE (1/1). 4. Lymph node, sentinel, biopsy, left common iliac - ONE BENIGN LYMPH NODE (0/1). 5. Uterus +/- tubes/ovaries, neoplastic, cervix - HIGH GRADE SEROUS CARCINOMA EXTENDING TO THE OUTER HALF OF THE MYOMETRIUM. - LYMPHOVASCULAR INVOLVEMENT BY TUMOR. - MARGINS NOT INVOLVED. - CERVIX, BILATERAL OVARIES AND BILATERAL FALLOPIAN TUBES FREE OF TUMOR. - MATURE CYSTIC TERATOMA OF LEFT OVARY.  03/04/16 Diagnosis Endometrium, curettage HIGH GRADE SEROUS CARCINOMA  Past/Anticipated interventions by Gyn/Onc surgery, if any: 03/26/16 - Procedure: XI ROBOTIC ASSISTED TOTAL Laproscopic HYSTERECTOMY WITH BILATERAL SALPINGO OOPHORECTOMY;  Surgeon: Everitt Amber, MD   Past/Anticipated interventions by medical oncology, if any: 6 cycles of carboplatin taxol - completed 5 cycles on 08/10/16  Weight changes, if any: no  Bowel/Bladder complaints, if any: reports having urinary frequency and incontinence.  Denies having any bowel issues.  Nausea/Vomiting, if any: no  Pain issues, if any:  no  SAFETY ISSUES:  Prior radiation? no  Pacemaker/ICD? no  Possible current pregnancy? no  Is the patient on methotrexate? no  Current Complaints / other details:     BP 112/80 (BP Location: Left Wrist, Patient Position: Sitting)   Pulse 92   Temp 98.1 F (36.7 C) (Oral)   Ht 5\' 2"  (1.575 m)   Wt 236 lb 12.8 oz (107.4 kg)   SpO2 96%   BMI 43.31 kg/m    Wt Readings from Last 3 Encounters:   09/07/16 236 lb 12.8 oz (107.4 kg)  08/17/16 238 lb 11.2 oz (108.3 kg)  08/03/16 232 lb 3.2 oz (105.3 kg)

## 2016-09-07 ENCOUNTER — Ambulatory Visit
Admission: RE | Admit: 2016-09-07 | Discharge: 2016-09-07 | Disposition: A | Discharge status: Home / Self Care | Payer: Medicare Other | Source: Ambulatory Visit | Attending: Radiation Oncology | Admitting: Radiation Oncology

## 2016-09-07 ENCOUNTER — Encounter: Payer: Self-pay | Admitting: Radiation Oncology

## 2016-09-07 ENCOUNTER — Other Ambulatory Visit: Payer: Medicare Other

## 2016-09-07 ENCOUNTER — Ambulatory Visit: Payer: Medicare Other

## 2016-09-07 VITALS — BP 112/80 | HR 92 | Temp 98.1°F | Ht 62.0 in | Wt 236.8 lb

## 2016-09-07 DIAGNOSIS — Z8249 Family history of ischemic heart disease and other diseases of the circulatory system: Secondary | ICD-10-CM | POA: Insufficient documentation

## 2016-09-07 DIAGNOSIS — I10 Essential (primary) hypertension: Secondary | ICD-10-CM | POA: Insufficient documentation

## 2016-09-07 DIAGNOSIS — C541 Malignant neoplasm of endometrium: Secondary | ICD-10-CM | POA: Diagnosis present

## 2016-09-07 DIAGNOSIS — E039 Hypothyroidism, unspecified: Secondary | ICD-10-CM | POA: Insufficient documentation

## 2016-09-07 DIAGNOSIS — Z90710 Acquired absence of both cervix and uterus: Secondary | ICD-10-CM | POA: Insufficient documentation

## 2016-09-07 DIAGNOSIS — F329 Major depressive disorder, single episode, unspecified: Secondary | ICD-10-CM | POA: Insufficient documentation

## 2016-09-07 DIAGNOSIS — G4733 Obstructive sleep apnea (adult) (pediatric): Secondary | ICD-10-CM | POA: Insufficient documentation

## 2016-09-07 DIAGNOSIS — Z90722 Acquired absence of ovaries, bilateral: Secondary | ICD-10-CM | POA: Insufficient documentation

## 2016-09-07 DIAGNOSIS — M1990 Unspecified osteoarthritis, unspecified site: Secondary | ICD-10-CM | POA: Diagnosis not present

## 2016-09-07 DIAGNOSIS — Z6841 Body Mass Index (BMI) 40.0 and over, adult: Secondary | ICD-10-CM | POA: Diagnosis not present

## 2016-09-07 DIAGNOSIS — Z823 Family history of stroke: Secondary | ICD-10-CM | POA: Diagnosis not present

## 2016-09-07 DIAGNOSIS — K219 Gastro-esophageal reflux disease without esophagitis: Secondary | ICD-10-CM | POA: Diagnosis not present

## 2016-09-07 DIAGNOSIS — E119 Type 2 diabetes mellitus without complications: Secondary | ICD-10-CM | POA: Insufficient documentation

## 2016-09-07 DIAGNOSIS — E785 Hyperlipidemia, unspecified: Secondary | ICD-10-CM | POA: Diagnosis not present

## 2016-09-07 DIAGNOSIS — Z79899 Other long term (current) drug therapy: Secondary | ICD-10-CM | POA: Diagnosis not present

## 2016-09-07 NOTE — Progress Notes (Signed)
Please see the Nurse Progress Note in the MD Initial Consult Encounter for this patient. 

## 2016-09-07 NOTE — Progress Notes (Signed)
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 female who experienced heavy vaginal bleeding on 01/28/16 followed by two episodes of spotting.  Biopsy on 03/26/16 revealed metastatic carcinoma in one lymph node, in the left obturator and three benign lymph nodes (1/4). Biopsy of the uterus, and cervix revealed high grade serous carcinoma extending to the outer half of the myometrium, and lymphovascular involvement by the tumor. 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  Patient underwent a laproscopic hysterectomy with bilateral salpingo oophorectomy on 03/26/16.  Patient has completed 5 of 6 cycles on carboplatin taxol on 08/10/16.  Patient reports urinary frequency and incontinence. She denies bowel issues, weight issues, nausea/ vomiting, or pain at this time.  PREVIOUS RADIATION THERAPY: No  PAST MEDICAL HISTORY:  has a past medical history of Cancer ([place]); Depression; Diabetes mellitus without complication ([place]); Esophageal reflux; Hyperlipidemia; Hypertension; Hypothyroidism; Morbid obesity with BMI of 40.0-44.9, adult ([place]); Nocturia; OSA on CPAP; Osteoarthritis; Phlebitis; Pneumonia; Prediabetes; Spondylosis; and Urinary frequency.    PAST SURGICAL HISTORY: Past Surgical History:  Procedure Laterality Date  . CHOLECYSTECTOMY  age 61  . DILATATION & CURETTAGE/HYSTEROSCOPY WITH MYOSURE N/A 03/04/2016   Procedure: DILATATION & CURETTAGE/HYSTEROSCOPY ;  Surgeon: Azucena Fallen, MD;  Location: [facility];  Service: Gynecology;  Laterality: N/A;  polyp removal   . HERNIA REPAIR  age 67   umblical  . IR GENERIC HISTORICAL   05/15/2016   IR US GUIDE VASC ACCESS RIGHT 05/15/2016 Aletta Edouard, MD [facility]  . IR GENERIC HISTORICAL  05/15/2016   IR FLUORO GUIDE CV LINE RIGHT 05/15/2016 Aletta Edouard, MD [facility]  . LYMPH NODE BIOPSY N/A 03/26/2016   Procedure: Sentinel LYMPH NODE BIOPSY;  Surgeon: Everitt Amber, MD;  Location: [facility];  Service: Gynecology;  Laterality: N/A;  . ROBOTIC ASSISTED TOTAL HYSTERECTOMY WITH BILATERAL SALPINGO OOPHERECTOMY N/A 03/26/2016   Procedure: XI ROBOTIC ASSISTED TOTAL Laproscopic HYSTERECTOMY WITH BILATERAL SALPINGO OOPHORECTOMY;  Surgeon: Everitt Amber, MD;  Location: [facility];  Service: Gynecology;  Laterality: N/A;  . TOTAL KNEE ARTHROPLASTY Left 10/09/2013   Procedure: LEFT TOTAL KNEE ARTHROPLASTY;  Surgeon: [name], MD;  Location: [facility];  Service: Orthopedics;  Laterality: Left;  . TOTAL KNEE ARTHROPLASTY Right 04/09/2014   Procedure: RIGHT TOTAL KNEE ARTHROPLASTY;  Surgeon: [name], MD;  Location: [facility];  Service: Orthopedics;  Laterality: Right;  . TUBAL LIGATION  age 65  . VARICOSE VEIN SURGERY Bilateral age 73  . WISDOM TOOTH EXTRACTION  age 41    FAMILY HISTORY: family history includes CVA in her father; Heart disease in her father and mother.  SOCIAL HISTORY:  reports that she has never smoked. She has never used smokeless tobacco. She reports that she does not drink alcohol or use drugs.  ALLERGIES: Oxybutynin  MEDICATIONS:  Current Outpatient Prescriptions  Medication Sig Dispense Refill  . acetaminophen (TYLENOL) 500 MG tablet Take 1,000 mg by mouth 2 (two) times daily.     Marland Kitchen allopurinol (ZYLOPRIM) 300 MG tablet Take 300 mg by mouth daily with breakfast.     . calcium gluconate 500 MG tablet Takes 1 tablet in  am and 2 in the evening.    . cetirizine (ZYRTEC) 10 MG tablet Take 10 mg by mouth daily.    . citalopram (CELEXA) 10 MG tablet Take 10 mg by mouth daily.    . Cyanocobalamin (VITAMIN B-12 PO) Take by mouth.    . docusate sodium (COLACE) 100  MG capsule Take 100 mg by mouth daily as needed for mild constipation.    . Ferrous Sulfate 90 (18 Fe) MG TABS Take 1 tablet by mouth daily.    . furosemide (LASIX) 20 MG tablet Take 20 mg by mouth daily.    Marland Kitchen HAWTHORNE BERRY PO Take 500 mg by mouth 2 (two) times daily.    . hydrOXYzine (ATARAX/VISTARIL) 25 MG tablet Take 25 mg by mouth at bedtime.    Marland Kitchen levothyroxine (SYNTHROID, LEVOTHROID) 125 MCG tablet Take 125 mcg by mouth daily before breakfast.    . lidocaine-prilocaine (EMLA) cream Apply 1-2 hours prior to [facility] access as directed 30 g 1  . lisinopril (PRINIVIL,ZESTRIL) 20 MG tablet Take 20 mg by mouth daily with breakfast.     . Multiple Vitamins-Minerals (MULTIVITAMIN WITH MINERALS) tablet Take 1 tablet by mouth daily.    . Omega-3 Fatty Acids (FISH OIL PO) Take 1 tablet by mouth daily.    Marland Kitchen omeprazole (PRILOSEC OTC) 20 MG tablet Take 20 mg by mouth daily.    Marland Kitchen pyridOXINE (VITAMIN B-6) 100 MG tablet Take 100 mg by mouth daily.    Marland Kitchen tolterodine (DETROL LA) 4 MG 24 hr capsule Take 4 mg by mouth daily.    . traMADol (ULTRAM) 50 MG tablet Take 1-2 tablets (50-100 mg total) by mouth every 6 (six) hours as needed (mild pain). 80 tablet 1  . vitamin C (ASCORBIC ACID) 500 MG tablet Take 500 mg by mouth daily.    Marland Kitchen dexamethasone (DECADRON) 4 MG tablet Take 5 tablets with food 12 hrs  prior to Taxol Chemotherapy. (Patient not taking: Reported on 09/07/2016) 30 tablet 1  . fluconazole (DIFLUCAN) 100 MG tablet Take 1 tablet (100 mg total) by mouth daily. (Patient not taking: Reported on 09/07/2016) 7 tablet 1  . loratadine (CLARITIN) 10 MG tablet Take 10 mg by mouth daily.    Marland Kitchen LORazepam (ATIVAN) 0.5 MG tablet Place 1 tablet under the tongue or swallow every 6 hrs as needed for nausea. Will Make drowsy. (Patient not taking: Reported on 09/07/2016) 20 tablet 0  . ondansetron (ZOFRAN) 8 MG tablet Take 1 tablet (8 mg total) by mouth every 8 (eight) hours as needed for nausea or vomiting. (Patient  not taking: Reported on 09/07/2016) 30 tablet 1  . polyethylene glycol (MIRALAX / GLYCOLAX) packet Take 17 g by mouth daily as needed for mild constipation or moderate constipation. (Patient not taking: Reported on 09/07/2016) 14 each 0  . thiamine (VITAMIN B-1) 100 MG tablet Take 100 mg by mouth daily.     No current facility-administered medications for this encounter.     REVIEW OF SYSTEMS:  A 12 point review of systems is documented in the electronic medical record. This was obtained by the nursing staff. However, I reviewed this with the patient to discuss relevant findings and make appropriate changes.     PHYSICAL EXAM:  height is 5\' 2"  (1.575 m) and weight is 236 lb 12.8 oz (107.4 kg). Her oral temperature is 98.1 F (36.7 C). Her blood pressure is 112/80 and her pulse is 92. Her oxygen saturation is 96%.    General: Alert and  oriented, in no acute distress HEENT: Head is normocephalic. Extraocular movements are intact. Oropharynx is clear. Neck: Neck is supple, no palpable cervical or supraclavicular lymphadenopathy. Heart: Regular in rate and rhythm with no murmurs, rubs, or gallops. Chest: Clear to auscultation bilaterally, with no rhonchi, wheezes, or rales. Abdomen: Soft, nontender, nondistended, with no rigidity or guarding. Extremities: No cyanosis or edema. Lymphatics: see Neck Exam Skin: No concerning lesions. Musculoskeletal: symmetric strength and muscle tone throughout. Patient is ambulatory with a cane. Neurologic: Cranial nerves II through XII are grossly intact. No obvious focalities. Speech is fluent. Coordination is intact. Psychiatric: Judgment and insight are intact. Affect is appropriate. Pelvic exam deferred until simulation and planning day  ECOG = 1  LABORATORY DATA:  Lab Results  Component Value Date   WBC 3.1 (L) 08/17/2016   HGB 9.0 (L) 08/17/2016   HCT 28.1 (L) 08/17/2016   MCV 104.2 (H) 08/17/2016   PLT 289 08/17/2016   NEUTROABS 2.6 08/17/2016     Lab Results  Component Value Date   NA 136 08/17/2016   K 4.6 08/17/2016   CL 101 05/15/2016   CO2 23 08/17/2016   GLUCOSE 220 (H) 08/17/2016   CREATININE 0.8 08/17/2016   CALCIUM 9.8 08/17/2016      RADIOGRAPHY: Dg Chest 2 View  Result Date: 08/24/2016 CLINICAL DATA:  Exertional shortness of breath while walking short distances. Nonsmoker. Duration of symptoms 3 months. The patient is currently undergoing therapy for endometrial malignancy. EXAM: CHEST  2 VIEW COMPARISON:  Chest x-ray of March 19, 2016 and chest CT scan of March 24, 2016. FINDINGS: The lungs are reasonably well inflated though the right hemidiaphragm remains elevated. There is no focal infiltrate. There is a large hiatal hernia -partially intra thoracic stomach. The cardiac silhouette is mildly enlarged but stable. The pulmonary vascularity is mildly prominent centrally but also stable. There is calcification in the wall of the aortic arch. The indwelling venous catheter tip projects over the midportion of the SVC. There is multilevel degenerative disc disease of the thoracic spine. IMPRESSION: Mild cardiomegaly with central pulmonary vascular prominence may reflect low-grade compensated CHF. Elevation of the right hemidiaphragm as well as the large hiatal hernia -partially intra thoracic stomach likely correspond likely compromises the inflation of the lungs. No pneumonia nor pulmonary parenchymal masses. Thoracic aortic atherosclerosis. Electronically Signed   By: David  Martinique M.D.   On: 08/24/2016 13:20      IMPRESSION: IIIC1 high grade serous endometrial carcinoma. Patient would be at risk for vaginal cuff recurrence given the serous histology and presence of LVI.  I recommend vaginal brachytherapy as long as the patient's upcoming CT scans do not show metastatic disease.   PLAN: Pending review of the patient's scan tomorrow. I ------------------------------------------------  Blair Promise, PhD, MD  This document  serves as a record of services personally performed by Gery Pray, MD. It was created on his behalf by Bethann Humble, a trained medical scribe. The creation of this record is based on the scribe's personal observations and the provider's statements to them. This document has been checked and approved by the attending provider.

## 2016-09-08 ENCOUNTER — Encounter (HOSPITAL_COMMUNITY): Payer: Self-pay

## 2016-09-08 ENCOUNTER — Ambulatory Visit (HOSPITAL_COMMUNITY)
Admission: RE | Admit: 2016-09-08 | Discharge: 2016-09-08 | Disposition: A | Discharge status: Home / Self Care | Payer: Medicare Other | Source: Ambulatory Visit | Attending: Oncology | Admitting: Oncology

## 2016-09-08 DIAGNOSIS — I2510 Atherosclerotic heart disease of native coronary artery without angina pectoris: Secondary | ICD-10-CM | POA: Insufficient documentation

## 2016-09-08 DIAGNOSIS — K449 Diaphragmatic hernia without obstruction or gangrene: Secondary | ICD-10-CM | POA: Diagnosis not present

## 2016-09-08 DIAGNOSIS — I700 Atherosclerosis of aorta: Secondary | ICD-10-CM | POA: Insufficient documentation

## 2016-09-08 DIAGNOSIS — Z90710 Acquired absence of both cervix and uterus: Secondary | ICD-10-CM | POA: Diagnosis not present

## 2016-09-08 DIAGNOSIS — C541 Malignant neoplasm of endometrium: Secondary | ICD-10-CM | POA: Insufficient documentation

## 2016-09-08 MED ORDER — IOPAMIDOL (ISOVUE-300) INJECTION 61%
INTRAVENOUS | Status: AC
  Administered 2016-09-08: 100 mL
  Filled 2016-09-08: qty 100

## 2016-09-08 MED ORDER — SODIUM CHLORIDE 0.9 % IJ SOLN
INTRAMUSCULAR | Status: AC
  Filled 2016-09-08: qty 50

## 2016-09-09 ENCOUNTER — Other Ambulatory Visit: Payer: Self-pay | Admitting: Oncology

## 2016-09-10 ENCOUNTER — Other Ambulatory Visit (HOSPITAL_BASED_OUTPATIENT_CLINIC_OR_DEPARTMENT_OTHER): Payer: Medicare Other

## 2016-09-10 ENCOUNTER — Ambulatory Visit: Payer: Medicare Other

## 2016-09-10 ENCOUNTER — Ambulatory Visit (HOSPITAL_BASED_OUTPATIENT_CLINIC_OR_DEPARTMENT_OTHER): Payer: Medicare Other | Admitting: Oncology

## 2016-09-10 VITALS — BP 132/70 | HR 92 | Temp 98.0°F | Resp 18 | Ht 62.0 in | Wt 236.1 lb

## 2016-09-10 DIAGNOSIS — I10 Essential (primary) hypertension: Secondary | ICD-10-CM

## 2016-09-10 DIAGNOSIS — Z95828 Presence of other vascular implants and grafts: Secondary | ICD-10-CM

## 2016-09-10 DIAGNOSIS — K449 Diaphragmatic hernia without obstruction or gangrene: Secondary | ICD-10-CM

## 2016-09-10 DIAGNOSIS — G622 Polyneuropathy due to other toxic agents: Secondary | ICD-10-CM

## 2016-09-10 DIAGNOSIS — K219 Gastro-esophageal reflux disease without esophagitis: Secondary | ICD-10-CM | POA: Diagnosis not present

## 2016-09-10 DIAGNOSIS — C541 Malignant neoplasm of endometrium: Secondary | ICD-10-CM | POA: Diagnosis not present

## 2016-09-10 DIAGNOSIS — M519 Unspecified thoracic, thoracolumbar and lumbosacral intervertebral disc disorder: Secondary | ICD-10-CM

## 2016-09-10 DIAGNOSIS — D6481 Anemia due to antineoplastic chemotherapy: Secondary | ICD-10-CM

## 2016-09-10 DIAGNOSIS — I2584 Coronary atherosclerosis due to calcified coronary lesion: Secondary | ICD-10-CM

## 2016-09-10 DIAGNOSIS — E039 Hypothyroidism, unspecified: Secondary | ICD-10-CM

## 2016-09-10 DIAGNOSIS — I2510 Atherosclerotic heart disease of native coronary artery without angina pectoris: Secondary | ICD-10-CM

## 2016-09-10 DIAGNOSIS — D638 Anemia in other chronic diseases classified elsewhere: Secondary | ICD-10-CM | POA: Diagnosis not present

## 2016-09-10 DIAGNOSIS — T451X5A Adverse effect of antineoplastic and immunosuppressive drugs, initial encounter: Secondary | ICD-10-CM

## 2016-09-10 DIAGNOSIS — I700 Atherosclerosis of aorta: Secondary | ICD-10-CM

## 2016-09-10 LAB — CBC WITH DIFFERENTIAL/PLATELET
BASO%: 0.4 % (ref 0.0–2.0)
BASOS ABS: 0 10*3/uL (ref 0.0–0.1)
EOS ABS: 0.1 10*3/uL (ref 0.0–0.5)
EOS%: 2.2 % (ref 0.0–7.0)
HCT: 32.4 % — ABNORMAL LOW (ref 34.8–46.6)
HGB: 10.5 g/dL — ABNORMAL LOW (ref 11.6–15.9)
LYMPH%: 17.4 % (ref 14.0–49.7)
MCH: 32.9 pg (ref 25.1–34.0)
MCHC: 32.4 g/dL (ref 31.5–36.0)
MCV: 101.6 fL — AB (ref 79.5–101.0)
MONO#: 0.6 10*3/uL (ref 0.1–0.9)
MONO%: 11.3 % (ref 0.0–14.0)
NEUT#: 3.8 10*3/uL (ref 1.5–6.5)
NEUT%: 68.7 % (ref 38.4–76.8)
PLATELETS: 137 10*3/uL — AB (ref 145–400)
RBC: 3.19 10*6/uL — AB (ref 3.70–5.45)
RDW: 17.8 % — ABNORMAL HIGH (ref 11.2–14.5)
WBC: 5.6 10*3/uL (ref 3.9–10.3)
lymph#: 1 10*3/uL (ref 0.9–3.3)
nRBC: 0 % (ref 0–0)

## 2016-09-10 LAB — COMPREHENSIVE METABOLIC PANEL
ALT: 14 U/L (ref 0–55)
AST: 17 U/L (ref 5–34)
Albumin: 3.1 g/dL — ABNORMAL LOW (ref 3.5–5.0)
Alkaline Phosphatase: 113 U/L (ref 40–150)
Anion Gap: 8 mEq/L (ref 3–11)
BILIRUBIN TOTAL: 0.36 mg/dL (ref 0.20–1.20)
BUN: 17.4 mg/dL (ref 7.0–26.0)
CHLORIDE: 104 meq/L (ref 98–109)
CO2: 25 meq/L (ref 22–29)
CREATININE: 0.9 mg/dL (ref 0.6–1.1)
Calcium: 9.5 mg/dL (ref 8.4–10.4)
EGFR: 64 mL/min/{1.73_m2} — ABNORMAL LOW (ref >90)
Glucose: 148 mg/dl — ABNORMAL HIGH (ref 70–140)
Potassium: 4.2 mEq/L (ref 3.5–5.1)
Sodium: 138 mEq/L (ref 136–145)
TOTAL PROTEIN: 6.3 g/dL — AB (ref 6.4–8.3)

## 2016-09-10 MED ORDER — SODIUM CHLORIDE 0.9 % IJ SOLN
10.0000 mL | INTRAMUSCULAR | Status: DC | PRN
  Administered 2016-09-10: 10 mL via INTRAVENOUS
  Filled 2016-09-10: qty 10

## 2016-09-10 MED ORDER — HEPARIN SOD (PORK) LOCK FLUSH 100 UNIT/ML IV SOLN
500.0000 [IU] | Freq: Once | INTRAVENOUS | Status: AC | PRN
  Administered 2016-09-10: 500 [IU] via INTRAVENOUS
  Filled 2016-09-10: qty 5

## 2016-09-10 NOTE — Progress Notes (Signed)
OFFICE PROGRESS NOTE   September 10, 2016   Physicians: Elana Alm, Maury Dus, MD; V.Mody, F.Alusio, R.Buccini, Alphonsa Overall  INTERVAL HISTORY:   Patient is seen, alone for visit, in follow up of IIIC1 high grade serous endometrial carcinoma, for which she received five partial cycles of dose dense carbo taxol from 05-04-16 thru 08-10-16, tolerated poorly. She had CT AP 09-08-16 and is to see Dr Denman George next on 09-23-16. CT does not show obvious residual or progressive disease, 9 mm left inguinal node and 9 mm left periarotic node both smaller than last imaging. She had 1 unit PRBCs on 08-17-16 for hgb 9.0 and symptomatic. She saw Dr Sondra Come in consultation on 09-07-16, vaginal brachytherapy recommended depending on results of CT just done.  She has scheduled visit with PCP Dr Alyson Ingles 09-14-16  Patient is feeling gradually better since off of chemo, still fatigues but not as much, appetite good without nausea, bowels moving regularly, no abdominal or pelvic pain. No problems with PAC, which was not used for CT (discussed). No SOB at rest, no other respiratory symptoms, no chest pain. No bleeding. No increased swelling LE. No new or different pain chronic back problems. No fever or symptoms of infection. Minimal peripheral neuropathy not bothersome. Remainder of 10 point Review of Systems negative.    PAC placed by IR 05-15-16, flushed 09-10-16 No genetics testing. Flu vaccine 06-29-16  ONCOLOGIC HISTORY Patient had one episode of heavyvaginal bleeding on 01-28-16, then two later episodes of spotting. She saw Dr Benjie Karvonen, with [place] and D&C 03-04-16 demonstrating high grade serous endometrial carcinoma ([id number]). She was referred to Dr Denman George, with CT CAP 03-24-16 with 20 mm endometrium, 2 mildly enlarged left external iliac nodes and a mildly enlarged left para aortic node, but without evidence of other distant disease. She had robotic total hysterectomy BSO and sentinel node evaluation by Dr Denman George on  03-26-16, with pathology [id number]) high grade serous carcinoma of endometrium extending into outer half of myometrium, + LVSI, involved left obturator sentinel node with 3 other sentinel nodes negative, ovaries and tubes not involved. Post operative course was not remarkable. She completed 14 days of prophylactic lovenox postoperatively. SHe saw Dr Denman George in follow up on 04-20-16, doing well from surgery, recommendation for 6 cycles of carboplatin taxol and consideration of HDR at completion of chemo if doing well. Day 1 cycle 1 dose dense carbo taxol was given 05-04-16. She was neutropenic day 15 cycle 1, granix added. She had delays and missed doses thru cycle 4 despite increased granix and PRBC transfusion 07-21-16. Carbo dose reduced for day 1 cycle 5 after delay for thrombocytopenia, given on 08-10-16. Chemo DCd after day 1 cycle 5 due to poor tolerance and inadequate PS then.  CT AP 09-08-16 no clear residual disease tho 2 lymph nodes 9 mm each left inguinal and left periaortic.    Objective:  Vital signs in last 24 hours: Weight 236 lb which is down 2 lbs. BMI 43, 132/70, HR 92 regular, resp 18 not labored RA seated, temp 98, 96% sat.  Alert, oriented and appropriate, very pleasant as always,more talkative and looks more energetic today. Ambulatory with assistive device and effort.  Alopecia  HEENT:PERRL, sclerae not icteric. Oral mucosa moist without lesions, posterior pharynx clear.  Neck supple. No JVD.  Lymphatics:no cervical,supraclavicular or inguinal adenopathy Resp: clear to auscultation bilaterally without wheezes or rales,  and no dullness tol percussion bilaterally Cardio: regular rate and rhythm. No gallop. GI: abdomen obese, soft, nontender, not distended,  cannot appreciate mass or organomegaly. Normally active bowel sounds. Surgical incisions not remarkable. Musculoskeletal/ Extremities: without pitting edema, cords, tenderness Neuro: no increased peripheral neuropathy. Otherwise  nonfocal. PSYCH cheerful mood and affect Skin without rash,petechiae. Resolving ecchymosis right antecubital at site of peripheral IV for CT  Portacath-without erythema or tenderness  Lab Results:  Results for orders placed or performed in visit on 09/10/16  Comprehensive metabolic panel  Result Value Ref Range   Sodium 138 136 - 145 mEq/L   Potassium 4.2 3.5 - 5.1 mEq/L   Chloride 104 98 - 109 mEq/L   CO2 25 22 - 29 mEq/L   Glucose 148 (H) 70 - 140 mg/dl   BUN 17.4 7.0 - 26.0 mg/dL   Creatinine 0.9 0.6 - 1.1 mg/dL   Total Bilirubin 0.36 0.20 - 1.20 mg/dL   Alkaline Phosphatase 113 40 - 150 U/L   AST 17 5 - 34 U/L   ALT 14 0 - 55 U/L   Total Protein 6.3 (L) 6.4 - 8.3 g/dL   Albumin 3.1 (L) 3.5 - 5.0 g/dL   Calcium 9.5 8.4 - 10.4 mg/dL   Anion Gap 8 3 - 11 mEq/L   EGFR 64 (L) >90 ml/min/1.73 m2  CBC with Differential/Platelet  Result Value Ref Range   WBC 5.6 3.9 - 10.3 10e3/uL   NEUT# 3.8 1.5 - 6.5 10e3/uL   HGB 10.5 (L) 11.6 - 15.9 g/dL   HCT 32.4 (L) 34.8 - 46.6 %   Platelets 137 (L) 145 - 400 10e3/uL   MCV 101.6 (H) 79.5 - 101.0 fL   MCH 32.9 25.1 - 34.0 pg   MCHC 32.4 31.5 - 36.0 g/dL   RBC 3.19 (L) 3.70 - 5.45 10e6/uL   RDW 17.8 (H) 11.2 - 14.5 %   lymph# 1.0 0.9 - 3.3 10e3/uL   MONO# 0.6 0.1 - 0.9 10e3/uL   Eosinophils Absolute 0.1 0.0 - 0.5 10e3/uL   Basophils Absolute 0.0 0.0 - 0.1 10e3/uL   NEUT% 68.7 38.4 - 76.8 %   LYMPH% 17.4 14.0 - 49.7 %   MONO% 11.3 0.0 - 14.0 %   EOS% 2.2 0.0 - 7.0 %   BASO% 0.4 0.0 - 2.0 %   nRBC 0 0 - 0 %     Studies/Results:  Ct Abdomen Pelvis W Contrast  Result Date: 09/08/2016 CLINICAL DATA:  81 year old female with history of endometrial cancer. Last chemotherapy completed November 2017. Restaging examination. EXAM: CT ABDOMEN AND PELVIS WITH CONTRAST TECHNIQUE: Multidetector CT imaging of the abdomen and pelvis was performed using the standard protocol following bolus administration of intravenous contrast. CONTRAST:  1  ISOVUE-300 IOPAMIDOL (ISOVUE-300) INJECTION 61% COMPARISON:  CT of the chest, abdomen and pelvis 03/24/2016. FINDINGS: Lower chest: Atherosclerotic calcifications in the distal left main, left anterior descending, left circumflex and right coronary arteries. Central venous catheter tip terminating in the right atrium. Large hiatal hernia. Hepatobiliary: No cystic or solid hepatic lesions. No intra or extrahepatic biliary ductal dilatation. Status post cholecystectomy. Pancreas: No pancreatic mass. No pancreatic ductal dilatation. No pancreatic or peripancreatic fluid or inflammatory changes. Spleen: Unremarkable. Adrenals/Urinary Tract: Bilateral adrenal glands and bilateral kidneys are normal in appearance. There is no hydroureteronephrosis or perinephric stranding to indicate urinary tract obstruction at this time. Urinary bladder is normal in appearance. Stomach/Bowel: Intraabdominal portion of the stomach is unremarkable. No pathologic dilatation of small bowel or colon. The appendix is not confidently identified and may be surgically absent. Regardless, there are no inflammatory changes noted adjacent  to the cecum to suggest the presence of an acute appendicitis at this time. Vascular/Lymphatic: Aortic atherosclerosis, without evidence of aneurysm or dissection in the abdominal or pelvic vasculature. No lymphadenopathy noted in the abdomen or pelvis. Specifically, the previously noted lymph node along the left pelvic side wall is no longer identified. Previously noted left inguinal lymph node measures only 9 mm on today's examination. 9 mm short axis left periaortic lymph node near the renal hilum (image 38 of series 2) is slightly decreased in size compared to the prior study. Reproductive: Status post total abdominal hysterectomy and bilateral salpingo-oophorectomy. No unexpected soft tissue mass noted in the pelvis. Other: No significant volume of ascites.  No pneumoperitoneum. Musculoskeletal: There are no  aggressive appearing lytic or blastic lesions noted in the visualized portions of the skeleton. Dextroscoliosis convex to the right at the level of L3. Multilevel degenerative disc disease and facet arthropathy throughout the lumbar spine. IMPRESSION: 1. Status post total abdominal hysterectomy and bilateral salpingo-oophorectomy. No definite findings to suggest local recurrence or metastatic disease in the abdomen or pelvis. 2. Large hiatal hernia. 3. Aortic atherosclerosis, in addition to left main and 3 vessel coronary artery disease. Assessment for potential risk factor modification, dietary therapy or pharmacologic therapy may be warranted, if clinically indicated. 4. Additional incidental findings, as above. Electronically Signed   By: Vinnie Langton M.D.   On: 09/08/2016 17:36   PACs images reviewed by MD CT information reviewed with patient, including 2 small lymph nodes of questionable significance, nothing otherwise of concern for residual or progressive disease, marked degenerative changes thru spine, atherosclerotic changes in aorta and coronary arteries including left main.   Medications: I have reviewed the patient's current medications. On oral iron  DISCUSSION Patient is pleased that she is feeling better, enjoying holiday activities with friends.   CT information discussed with patient. She has appointment upcoming with Dr Denman George for her review also; I will forward CT information to Dr Sondra Come.  Patient is willing to proceed with vaginal brachytherapy per her discussion with Dr Sondra Come.   Discussed keeping PAC for now, tho may prefer to remove this if she is doing well after several months. She understands that PAC needs to be flushed every 6-8 weeks when not otherwise used. Consider removing this if not needed over next several months.  I will see her with repeat labs from [facility] in late Jan. She understands that another medical oncologist will be working with gyn oncology team after  Jan.    Assessment/Plan:  1. IIIC1 high grade serous endometrial carcinoma: post robotic assisted total hysterectomy BSO and sentinel node evaluation by Dr Denman George 03-26-16. With progressive difficulty tolerating chemotherapy and poor PS, adjuvant chemo discontinued after day 1 cycle 5 08-10-16,  dose dense carbo taxol. CT AP 09-08-16 as above, to see Dr Denman George 09-23-16. Likely vaginal brachytherapy,  Drs Sondra Come and Denman George to review CT.  I will see her for follow up including CBC CMET late Jan.  2. Anemia: chemo,  chronic disease.  PRBCs 07-21-16 and 08-17-16. Hemoglobin up to 10.5 today. 3.history LE DVT 20 years ago.  4.borderline DM. transient elevation of blood sugars with steroids for taxol, SS insulin used with chemo infusions.   5.multilevel lumbar degenerative disc disease and scoliosis with chronic back pain. Known to Dr Geanie Kenning replacements 6.obstructive sleep apnea on CPAP 7.urinary frequency with related sleep disturbance, long-standing.  8. Post lap chole and umbilical hernia repair. HH. GERD controlled on prilosce 9.HTN controlled. Elevated lipids. CAD  by CT. Atherosclerosis thoracic and abdominal aorta on CT 10.hypothyroid on synthroid by Dr Alyson Ingles 11.up to date colonoscopy, no polyps per patient 12.morbid obesity 13.PAC in place. Will need to flush every 6-8 weeks when not otherwise used.  14.flu vaccine 06-29-16    All questions answered and she knows to call if concerns prior to next appointment. She will keep appointment with Dr Denman George on 09-23-17. I will let Dr Sondra Come know results of CT as above. Route this note/ labs/ CT report to Dr Alyson Ingles, message to HIM to be sure his office aware for visit 09-14-16.  Time spent 25 min including >50% counseling and coordination of care.   Evlyn Clines, MD   09/10/2016, 12:05 PM

## 2016-09-12 ENCOUNTER — Telehealth: Payer: Self-pay

## 2016-09-12 ENCOUNTER — Encounter: Payer: Self-pay | Admitting: Oncology

## 2016-09-12 DIAGNOSIS — K449 Diaphragmatic hernia without obstruction or gangrene: Secondary | ICD-10-CM | POA: Insufficient documentation

## 2016-09-12 DIAGNOSIS — M519 Unspecified thoracic, thoracolumbar and lumbosacral intervertebral disc disorder: Secondary | ICD-10-CM | POA: Insufficient documentation

## 2016-09-12 DIAGNOSIS — I2584 Coronary atherosclerosis due to calcified coronary lesion: Secondary | ICD-10-CM

## 2016-09-12 DIAGNOSIS — I2510 Atherosclerotic heart disease of native coronary artery without angina pectoris: Secondary | ICD-10-CM | POA: Insufficient documentation

## 2016-09-12 DIAGNOSIS — I700 Atherosclerosis of aorta: Secondary | ICD-10-CM | POA: Insufficient documentation

## 2016-09-12 NOTE — Telephone Encounter (Signed)
-----   Message from Gordy Levan, MD sent at 09/12/2016 10:41 AM EST ----- Regarding: notes to PCP for visit 12-18 Please try to get my note 12-14 and CT report 12-12 to Dr Maury Dus for her visit 09-14-16.  I have routed this thru EMR, just not sure how quickly office finds that  Thanks Lennis

## 2016-09-14 ENCOUNTER — Ambulatory Visit: Payer: Medicare Other

## 2016-09-14 ENCOUNTER — Other Ambulatory Visit: Payer: Medicare Other

## 2016-09-14 DIAGNOSIS — Z0000 Encounter for general adult medical examination without abnormal findings: Secondary | ICD-10-CM | POA: Diagnosis not present

## 2016-09-14 DIAGNOSIS — C541 Malignant neoplasm of endometrium: Secondary | ICD-10-CM | POA: Diagnosis not present

## 2016-09-14 DIAGNOSIS — I129 Hypertensive chronic kidney disease with stage 1 through stage 4 chronic kidney disease, or unspecified chronic kidney disease: Secondary | ICD-10-CM | POA: Diagnosis not present

## 2016-09-14 DIAGNOSIS — Z6841 Body Mass Index (BMI) 40.0 and over, adult: Secondary | ICD-10-CM | POA: Diagnosis not present

## 2016-09-14 DIAGNOSIS — Z79899 Other long term (current) drug therapy: Secondary | ICD-10-CM | POA: Diagnosis not present

## 2016-09-14 DIAGNOSIS — N183 Chronic kidney disease, stage 3 (moderate): Secondary | ICD-10-CM | POA: Diagnosis not present

## 2016-09-14 DIAGNOSIS — E7800 Pure hypercholesterolemia, unspecified: Secondary | ICD-10-CM | POA: Diagnosis not present

## 2016-09-14 DIAGNOSIS — K219 Gastro-esophageal reflux disease without esophagitis: Secondary | ICD-10-CM | POA: Diagnosis not present

## 2016-09-14 DIAGNOSIS — E039 Hypothyroidism, unspecified: Secondary | ICD-10-CM | POA: Diagnosis not present

## 2016-09-14 DIAGNOSIS — E1129 Type 2 diabetes mellitus with other diabetic kidney complication: Secondary | ICD-10-CM | POA: Diagnosis not present

## 2016-09-14 DIAGNOSIS — I700 Atherosclerosis of aorta: Secondary | ICD-10-CM | POA: Diagnosis not present

## 2016-09-14 DIAGNOSIS — D649 Anemia, unspecified: Secondary | ICD-10-CM | POA: Diagnosis not present

## 2016-09-14 DIAGNOSIS — F3341 Major depressive disorder, recurrent, in partial remission: Secondary | ICD-10-CM | POA: Diagnosis not present

## 2016-09-14 NOTE — Telephone Encounter (Signed)
Faxed ofice note and CT report to Dr. Alyson Ingles as requested below by Dr.Livesay.

## 2016-09-18 DIAGNOSIS — Z1231 Encounter for screening mammogram for malignant neoplasm of breast: Secondary | ICD-10-CM | POA: Diagnosis not present

## 2016-09-18 DIAGNOSIS — Z853 Personal history of malignant neoplasm of breast: Secondary | ICD-10-CM | POA: Diagnosis not present

## 2016-09-22 NOTE — Progress Notes (Signed)
Follow-up Note: Gyn-Onc  Consult was initially requested by Dr. Benjie Karvonen for the evaluation of Gaynor L Fredderick Severance 81 y.o. female  CC:  Chief Complaint  Patient presents with  . malignant neoplasm of endometrium    Assessment/Plan:  Ms. ZIYON CEDOTAL  is a 81 y.o.  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   Awaiting vaginal brachytherapy treatments. Safe to proceed with these.  CT imaging reveals no progression/residual disease.  I will see her back in April, 2018.  HPI: Ashiah Karpowicz is a 81 year old G0 who is seen in consultation of Dr Benjie Karvonen for serous endometrial cancer in the setting of morbid obesity (BMI 44). The patient reports a history of postmenopausal bleeding on 01/28/16. She then saw Dr Benjie Karvonen who performed a TVUS on 02/13/16 which revealed 6.2 x 5.3 x 4cm and a thickened endometrial stripe. There was an echogenic mass measuring 22.8cm felt to represent a dermoid cyst on the left ovary. The right ovary was normal. A D&C was performed on 03/04/16 and showed high grade serous carcinoma.   She has morbid obesity with a BMI of 44kg/m2. She has HTN, and a remote history of a spontaneous DVT treated with 6 months of coumadin. She has chronic back pain and scoliosis. The patient's surgical history is most notable for a laparoscopic cholecystectomy and an umbilical hernia repair without mesh in 2007.  Interval Hx: Prior to surgery she underwent CT staging with a CT abdo/pelvis and chest. This showed some suspicious/borderline appearing nodes in her pelvic lymph nodes and at the left PA region (1.1cm in largest dimension).   On 03/26/16 she underwent robotic assisted total hysterectomy, BSO and SLN biopsy. Intraoperative findings  were significant for a clinically suspicious left obturator SLN. Final pathology confirmed a stage IIIC1 high grade serous carcinoma of the endometrium with a 3.8cm tumor invading 1.5 of 1.8cm of myometrial thickness with LVSI present. The ovaries and cervix were free of tumor, however the left obturator SLN contained a macrometastatic (>2m) focus of disease.   She completed 6 cycles of chemotherapy (carboplatin and paclitaxel) between 05-04-16 thru 08-10-16. Treatment was complicated by bone marrow suppression, bone pains.  CT scan on 09/08/16 showed no gross metastases or recurrence.  She has met with Dr KSondra Comewith a plan for vaginal brachytherapy. She is awaiting start of treatment.  Current Meds:  Outpatient Encounter Prescriptions as of 09/23/2016  Medication Sig  . acetaminophen (TYLENOL) 500 MG tablet Take 1,000 mg by mouth 2 (two) times daily.   .Marland Kitchenallopurinol (ZYLOPRIM) 300 MG tablet Take 300 mg by mouth daily with breakfast.   . calcium gluconate 500 MG tablet Takes 1 tablet in am and 2 in the evening.  . cetirizine (ZYRTEC) 10 MG tablet Take 10 mg by mouth daily.  . citalopram (CELEXA) 10 MG tablet Take 10 mg by mouth daily.  . Cyanocobalamin (VITAMIN B-12 PO) Take by mouth.  . docusate sodium (COLACE) 100 MG capsule Take 100 mg by mouth daily as needed for mild constipation.  . Ferrous Sulfate 90 (18 Fe) MG TABS Take 1 tablet by mouth daily.  . fluconazole (DIFLUCAN) 100 MG tablet Take 1 tablet (100 mg total) by mouth daily.  . furosemide (LASIX) 20 MG tablet Take 20 mg by mouth  daily.  Marland Kitchen HAWTHORNE BERRY PO Take 500 mg by mouth 2 (two) times daily.  . hydrOXYzine (ATARAX/VISTARIL) 25 MG tablet Take 25 mg by mouth at bedtime.  Marland Kitchen levothyroxine (SYNTHROID, LEVOTHROID) 125 MCG tablet Take 125 mcg by mouth daily before breakfast.  . lidocaine-prilocaine (EMLA) cream Apply 1-2 hours prior to [facility] access as directed  . lisinopril (PRINIVIL,ZESTRIL) 20 MG tablet Take 20 mg by mouth  daily with breakfast.   . LORazepam (ATIVAN) 0.5 MG tablet Place 1 tablet under the tongue or swallow every 6 hrs as needed for nausea. Will Make drowsy.  . Multiple Vitamins-Minerals (MULTIVITAMIN WITH MINERALS) tablet Take 1 tablet by mouth daily.  . Omega-3 Fatty Acids (FISH OIL PO) Take 1 tablet by mouth daily.  Marland Kitchen omeprazole (PRILOSEC OTC) 20 MG tablet Take 20 mg by mouth daily.  . ondansetron (ZOFRAN) 8 MG tablet Take 1 tablet (8 mg total) by mouth every 8 (eight) hours as needed for nausea or vomiting.  . polyethylene glycol (MIRALAX / GLYCOLAX) packet Take 17 g by mouth daily as needed for mild constipation or moderate constipation.  Marland Kitchen pyridOXINE (VITAMIN B-6) 100 MG tablet Take 100 mg by mouth daily.  Marland Kitchen thiamine (VITAMIN B-1) 100 MG tablet Take 100 mg by mouth daily.  Marland Kitchen tolterodine (DETROL LA) 4 MG 24 hr capsule Take 4 mg by mouth daily.  . traMADol (ULTRAM) 50 MG tablet Take 1-2 tablets (50-100 mg total) by mouth every 6 (six) hours as needed (mild pain).  . vitamin C (ASCORBIC ACID) 500 MG tablet Take 500 mg by mouth daily.   No facility-administered encounter medications on file as of 09/23/2016.     Allergy:  Allergies  Allergen Reactions  . Oxybutynin     Bleeding gums, felt sick    Social Hx:   Social History   Social History  . Marital status: Widowed    Spouse name: N/A  . Number of children: N/A  . Years of education: N/A   Occupational History  . Not on file.   Social History Main Topics  . Smoking status: Never Smoker  . Smokeless tobacco: Never Used  . Alcohol use Not on file  . Drug use: Unknown  . Sexual activity: Not Currently   Other Topics Concern  . Not on file   Social History Narrative  . No narrative on file    Past Surgical Hx:  Past Surgical History:  Procedure Laterality Date  . CHOLECYSTECTOMY  age 4  . DILATATION & CURETTAGE/HYSTEROSCOPY WITH MYOSURE N/A 03/04/2016   Procedure: DILATATION & CURETTAGE/HYSTEROSCOPY ;  Surgeon:  Azucena Fallen, MD;  Location: [facility];  Service: Gynecology;  Laterality: N/A;  polyp removal   . HERNIA REPAIR  age 22   umblical  . IR GENERIC HISTORICAL  05/15/2016   IR US GUIDE VASC ACCESS RIGHT 05/15/2016 Aletta Edouard, MD [facility]  . IR GENERIC HISTORICAL  05/15/2016   IR FLUORO GUIDE CV LINE RIGHT 05/15/2016 Aletta Edouard, MD [facility]  . LYMPH NODE BIOPSY N/A 03/26/2016   Procedure: Sentinel LYMPH NODE BIOPSY;  Surgeon: Everitt Amber, MD;  Location: [facility];  Service: Gynecology;  Laterality: N/A;  . ROBOTIC ASSISTED TOTAL HYSTERECTOMY WITH BILATERAL SALPINGO OOPHERECTOMY N/A 03/26/2016   Procedure: XI ROBOTIC ASSISTED TOTAL Laproscopic HYSTERECTOMY WITH BILATERAL SALPINGO OOPHORECTOMY;  Surgeon: Everitt Amber, MD;  Location: [facility];  Service: Gynecology;  Laterality: N/A;  . TOTAL KNEE ARTHROPLASTY Left 10/09/2013   Procedure: LEFT TOTAL KNEE ARTHROPLASTY;  Surgeon: Pilar Plate  Zella Ball, MD;  Location: [facility];  Service: Orthopedics;  Laterality: Left;  . TOTAL KNEE ARTHROPLASTY Right 04/09/2014   Procedure: RIGHT TOTAL KNEE ARTHROPLASTY;  Surgeon: [name], MD;  Location: [facility];  Service: Orthopedics;  Laterality: Right;  . TUBAL LIGATION  age 34  . VARICOSE VEIN SURGERY Bilateral age 72  . WISDOM TOOTH EXTRACTION  age 64    Past Medical Hx:  Past Medical History:  Diagnosis Date  . Cancer [facility])    endometrial cancer   . Depression   . Diabetes mellitus without complication (HCC)    borderline , diet controlled   . Esophageal reflux   . Hyperlipidemia    under control  . Hypertension   . Hypothyroidism   . Morbid obesity with BMI of 40.0-44.9, adult ([place])   . Nocturia   . OSA on CPAP   . Osteoarthritis   . Phlebitis    hx of  . Pneumonia    hx of   . Prediabetes    under control with diet  . Spondylosis    with scoliosis  . Urinary frequency     Past Gynecological History:  G0  No LMP recorded. Patient is postmenopausal.  Family Hx:  Family History  Problem  Relation Age of Onset  . Heart disease Mother   . Heart disease Father   . CVA Father     Review of Systems:  Constitutional  Feels well,    ENT Normal appearing ears and nares bilaterally Skin/Breast  No rash, sores, jaundice, itching, dryness Cardiovascular  No chest pain, shortness of breath, or edema  Pulmonary  No cough or wheeze.  Gastro Intestinal  No nausea, vomitting, or diarrhoea. No bright red blood per rectum, no abdominal pain, change in bowel movement, or constipation.  Genito Urinary  No frequency, urgency, dysuria, no bleeding Musculo Skeletal  No myalgia, arthralgia, joint swelling or pain  Neurologic  No weakness, numbness, change in gait,  Psychology  No depression, anxiety, insomnia.   Vitals:  Blood pressure 110/71, pulse 91, temperature 98.2 F (36.8 C), temperature source Oral, resp. rate 18, height '5\' 2"'  (1.575 m), weight 241 lb 11.2 oz (109.6 kg), SpO2 95 %.  Physical Exam: WD in NAD Neck  Supple NROM, without any enlargements.  Lymph Node Survey No cervical supraclavicular or inguinal adenopathy Cardiovascular  Pulse normal rate, regularity and rhythm. S1 and S2 normal.  Lungs  Clear to auscultation bilateraly. + audible wheezing on exertion. Increased work of breathing when positioning on bed. Good air movement.  Skin  No rash/lesions/breakdown  Psychiatry  Alert and oriented to person, place, and time  Abdomen  Normoactive bowel sounds, abdomen soft, non-tender and obese with pannus folds without evidence of hernia. Well healed incision sites. Back No CVA tenderness Genito Urinary  Vulva/vagina: Normal external female genitalia.  No lesions. No discharge or bleeding.  Bladder/urethra:  No lesions or masses, well supported bladder  Surgically absent cervix and uterus with no masses or lesions.  Vaginal cuff in tact and healing normally with suture material present.  No pelvic masses Rectal  deferred Extremities  No bilateral  cyanosis, clubbing or edema.   Donaciano Eva, MD  09/23/2016, 2:28 PM

## 2016-09-23 ENCOUNTER — Ambulatory Visit: Payer: Medicare Other | Attending: Gynecologic Oncology | Admitting: Gynecologic Oncology

## 2016-09-23 ENCOUNTER — Encounter: Payer: Self-pay | Admitting: Gynecologic Oncology

## 2016-09-23 VITALS — BP 110/71 | HR 91 | Temp 98.2°F | Resp 18 | Ht 62.0 in | Wt 241.7 lb

## 2016-09-23 DIAGNOSIS — F329 Major depressive disorder, single episode, unspecified: Secondary | ICD-10-CM | POA: Insufficient documentation

## 2016-09-23 DIAGNOSIS — M419 Scoliosis, unspecified: Secondary | ICD-10-CM | POA: Insufficient documentation

## 2016-09-23 DIAGNOSIS — G4733 Obstructive sleep apnea (adult) (pediatric): Secondary | ICD-10-CM | POA: Insufficient documentation

## 2016-09-23 DIAGNOSIS — C541 Malignant neoplasm of endometrium: Secondary | ICD-10-CM | POA: Diagnosis not present

## 2016-09-23 DIAGNOSIS — E785 Hyperlipidemia, unspecified: Secondary | ICD-10-CM | POA: Diagnosis not present

## 2016-09-23 DIAGNOSIS — I10 Essential (primary) hypertension: Secondary | ICD-10-CM | POA: Diagnosis not present

## 2016-09-23 DIAGNOSIS — E039 Hypothyroidism, unspecified: Secondary | ICD-10-CM | POA: Insufficient documentation

## 2016-09-23 DIAGNOSIS — K219 Gastro-esophageal reflux disease without esophagitis: Secondary | ICD-10-CM | POA: Insufficient documentation

## 2016-09-23 DIAGNOSIS — M1990 Unspecified osteoarthritis, unspecified site: Secondary | ICD-10-CM | POA: Insufficient documentation

## 2016-09-23 DIAGNOSIS — Z90710 Acquired absence of both cervix and uterus: Secondary | ICD-10-CM | POA: Diagnosis not present

## 2016-09-23 DIAGNOSIS — M549 Dorsalgia, unspecified: Secondary | ICD-10-CM | POA: Insufficient documentation

## 2016-09-23 DIAGNOSIS — G8929 Other chronic pain: Secondary | ICD-10-CM | POA: Insufficient documentation

## 2016-09-23 DIAGNOSIS — Z6841 Body Mass Index (BMI) 40.0 and over, adult: Secondary | ICD-10-CM | POA: Insufficient documentation

## 2016-09-23 DIAGNOSIS — E119 Type 2 diabetes mellitus without complications: Secondary | ICD-10-CM | POA: Insufficient documentation

## 2016-09-23 DIAGNOSIS — Z86718 Personal history of other venous thrombosis and embolism: Secondary | ICD-10-CM | POA: Insufficient documentation

## 2016-09-24 ENCOUNTER — Ambulatory Visit (INDEPENDENT_AMBULATORY_CARE_PROVIDER_SITE_OTHER): Payer: Medicare Other | Admitting: Podiatry

## 2016-09-24 VITALS — BP 107/66 | HR 78 | Resp 18 | Ht 62.0 in | Wt 241.0 lb

## 2016-09-24 DIAGNOSIS — I2510 Atherosclerotic heart disease of native coronary artery without angina pectoris: Secondary | ICD-10-CM | POA: Diagnosis not present

## 2016-09-24 DIAGNOSIS — M79676 Pain in unspecified toe(s): Secondary | ICD-10-CM

## 2016-09-24 DIAGNOSIS — I2584 Coronary atherosclerosis due to calcified coronary lesion: Secondary | ICD-10-CM | POA: Diagnosis not present

## 2016-09-24 DIAGNOSIS — B351 Tinea unguium: Secondary | ICD-10-CM | POA: Diagnosis not present

## 2016-09-24 NOTE — Progress Notes (Signed)
   Subjective:    Patient ID: Melinda Kerr, female    DOB: Feb 14, 1935, 81 y.o.   MRN: [id number]  HPI this patient presents to the office with chief complaint of severe pain noted especially in the big toes on both feet. She states that the nails have become thick and long are painful walking and wearing her shoes. She also relates pain as she sleeps at night and she wears socks that have open area at the level of the big toes. She presents the office today for definitive evaluation and treatment of this condition.  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  She says she usually has her nails done at [facility].    Review of Systems  All other systems reviewed and are negative.      Objective:   Physical Exam GENERAL APPEARANCE: Alert, conversant. Appropriately groomed. No acute distress.  VASCULAR: Pedal pulses are  palpable at  [facility] and PT bilateral.  Capillary refill time is immediate to all digits,  Normal temperature gradient.   NEUROLOGIC: sensation is diminished  to 5.07 monofilament at 5/5 sites bilateral.  Light touch is intact bilateral, Muscle strength normal.  MUSCULOSKELETAL: acceptable muscle strength, tone and stability bilateral.  Intrinsic muscluature intact bilateral.  Rectus appearance of foot and digits noted bilateral.   DERMATOLOGIC: skin color, texture, and turgor are within normal limits.  No preulcerative lesions or ulcers  are seen, no interdigital maceration noted.  No open lesions present.   No drainage noted.  NAILS  Thick disfigured discolored nails both feet.        Assessment & Plan:  Onychomycosis  B/L  IE  Debridement of nails.  RTC 10 weeks.   Gardiner Barefoot DPM

## 2016-09-28 HISTORY — PX: CATARACT EXTRACTION, BILATERAL: SHX1313

## 2016-10-02 ENCOUNTER — Telehealth: Payer: Self-pay | Admitting: *Deleted

## 2016-10-02 NOTE — Telephone Encounter (Signed)
CALLED PATIENT TO INFORM OF NEW HDR VAG. CUFF CASE, LVM FOR A RETURN CALL

## 2016-10-05 ENCOUNTER — Telehealth: Payer: Self-pay | Admitting: *Deleted

## 2016-10-05 NOTE — Telephone Encounter (Signed)
CALLED PATIENT TO ASK ABOUT COMING IN FOR HDR VAG. CUFF CASE ON 10-08-16 INSTEAD OF 10-06-16, PATIENT AGREED TO CHANGE.

## 2016-10-06 ENCOUNTER — Ambulatory Visit: Payer: Medicare Other | Admitting: Radiation Oncology

## 2016-10-06 ENCOUNTER — Ambulatory Visit: Payer: Medicare Other

## 2016-10-07 ENCOUNTER — Encounter: Payer: Self-pay | Admitting: Radiation Oncology

## 2016-10-07 ENCOUNTER — Telehealth: Payer: Self-pay | Admitting: *Deleted

## 2016-10-07 NOTE — Telephone Encounter (Signed)
Called patient to remind of New HDR VCC, lvm for a return call 

## 2016-10-08 ENCOUNTER — Encounter: Payer: Self-pay | Admitting: Radiation Oncology

## 2016-10-08 ENCOUNTER — Ambulatory Visit
Admission: RE | Admit: 2016-10-08 | Discharge: 2016-10-08 | Disposition: A | Discharge status: Home / Self Care | Payer: Medicare Other | Source: Ambulatory Visit | Attending: Radiation Oncology | Admitting: Radiation Oncology

## 2016-10-08 VITALS — BP 106/72 | HR 87 | Temp 98.4°F | Ht 62.0 in | Wt 241.0 lb

## 2016-10-08 DIAGNOSIS — Z8249 Family history of ischemic heart disease and other diseases of the circulatory system: Secondary | ICD-10-CM | POA: Diagnosis not present

## 2016-10-08 DIAGNOSIS — C541 Malignant neoplasm of endometrium: Secondary | ICD-10-CM | POA: Insufficient documentation

## 2016-10-08 DIAGNOSIS — Z5189 Encounter for other specified aftercare: Secondary | ICD-10-CM | POA: Diagnosis not present

## 2016-10-08 DIAGNOSIS — Z4689 Encounter for fitting and adjustment of other specified devices: Secondary | ICD-10-CM | POA: Diagnosis not present

## 2016-10-08 DIAGNOSIS — Z9221 Personal history of antineoplastic chemotherapy: Secondary | ICD-10-CM | POA: Diagnosis not present

## 2016-10-08 DIAGNOSIS — Z923 Personal history of irradiation: Secondary | ICD-10-CM | POA: Insufficient documentation

## 2016-10-08 DIAGNOSIS — Z79899 Other long term (current) drug therapy: Secondary | ICD-10-CM | POA: Insufficient documentation

## 2016-10-08 DIAGNOSIS — Z90722 Acquired absence of ovaries, bilateral: Secondary | ICD-10-CM | POA: Diagnosis not present

## 2016-10-08 DIAGNOSIS — E785 Hyperlipidemia, unspecified: Secondary | ICD-10-CM | POA: Diagnosis not present

## 2016-10-08 DIAGNOSIS — F329 Major depressive disorder, single episode, unspecified: Secondary | ICD-10-CM | POA: Diagnosis not present

## 2016-10-08 DIAGNOSIS — K219 Gastro-esophageal reflux disease without esophagitis: Secondary | ICD-10-CM | POA: Diagnosis not present

## 2016-10-08 DIAGNOSIS — Z6841 Body Mass Index (BMI) 40.0 and over, adult: Secondary | ICD-10-CM | POA: Diagnosis not present

## 2016-10-08 DIAGNOSIS — E039 Hypothyroidism, unspecified: Secondary | ICD-10-CM | POA: Diagnosis not present

## 2016-10-08 DIAGNOSIS — E119 Type 2 diabetes mellitus without complications: Secondary | ICD-10-CM | POA: Diagnosis not present

## 2016-10-08 DIAGNOSIS — Z823 Family history of stroke: Secondary | ICD-10-CM | POA: Diagnosis not present

## 2016-10-08 DIAGNOSIS — I10 Essential (primary) hypertension: Secondary | ICD-10-CM | POA: Diagnosis not present

## 2016-10-08 DIAGNOSIS — M1990 Unspecified osteoarthritis, unspecified site: Secondary | ICD-10-CM | POA: Diagnosis not present

## 2016-10-08 DIAGNOSIS — G4733 Obstructive sleep apnea (adult) (pediatric): Secondary | ICD-10-CM | POA: Diagnosis not present

## 2016-10-08 DIAGNOSIS — Z90710 Acquired absence of both cervix and uterus: Secondary | ICD-10-CM | POA: Diagnosis not present

## 2016-10-08 NOTE — Progress Notes (Signed)
Shylyn Aydt is here for follow up/HDR.  She denies having pain other than pain in her right hip with prolonged standing.  She reports having urinary frequency.  She denies having any bowel issues or vaginal bleeding.  She reports having fatigue but said she is feeling much better since having chemotherapy.  BP 106/72 (BP Location: Left Arm, Patient Position: Sitting)   Pulse 87   Temp 98.4 F (36.9 C) (Oral)   Ht 5\' 2"  (1.575 m)   Wt 241 lb (109.3 kg)   SpO2 95%   BMI 44.08 kg/m    Wt Readings from Last 3 Encounters:  10/08/16 241 lb (109.3 kg)  09/24/16 241 lb (109.3 kg)  09/23/16 241 lb 11.2 oz (109.6 kg)

## 2016-10-08 NOTE — Progress Notes (Signed)
  Radiation Oncology         [phone]) [phone] ________________________________  Name: Melinda Kerr MRN: [id number]  Date: 10/08/2016  DOB: 1935/01/08  CC: [name], MD  Gordy Levan, MD  HDR BRACHYTHERAPY NOTE  DIAGNOSIS: Stage IIIC1 high grade serous endometrial carcinoma   Simple treatment device note: Patient had construction of her custom vaginal cylinder. She will be treated with a 2.5 cm diameter segmented cylinder. This conforms to her anatomy without undue discomfort.  Vaginal brachytherapy procedure node: The patient was brought to the [place] suite. Identity was confirmed. All relevant records and images related to the planned course of therapy were reviewed. The patient freely provided informed written consent to proceed with treatment after reviewing the details related to the planned course of therapy. The consent form was witnessed and verified by the simulation staff. Then, the patient was set-up in a stable reproducible supine position for radiation therapy. Pelvic exam revealed the cuff to be intact .The patient's custom vaginal cylinder was placed in the proximal vagina. This was affixed to the CT/MR stabilization plate to prevent slippage. Patient tolerated the placement well.  Verification simulation note:  A fiducial marker was placed within the vaginal cylinder. An AP and lateral film was then obtained through the pelvis area. This documented accurate position of the vaginal cylinder for treatment.  HDR BRACHYTHERAPY TREATMENT  The remote afterloading device was affixed to the vaginal cylinder by catheter. Patient then proceeded to undergo her first high-dose-rate treatment directed at the proximal vagina. The patient was prescribed a dose of 30 gray to be delivered to the mucosal surface. Treatment length was 4 cm. Patient was treated with 1 channel using 9 dwell positions. Treatment time was 157.6 seconds. Iridium 192 was the high-dose-rate source for  treatment. The patient tolerated the treatment well. After completion of her therapy, a radiation survey was performed documenting return of the iridium source into the [employer] safe.   PLAN: The patient will return for her second of 5 HDR treatments next week.   -----------------------------------  Blair Promise, PhD, MD  This document serves as a record of services personally performed by Gery Pray, MD. It was created on his behalf by Maryla Morrow, a trained medical scribe. The creation of this record is based on the scribe's personal observations and the provider's statements to them. This document has been checked and approved by the attending provider.

## 2016-10-08 NOTE — Progress Notes (Signed)
  Radiation Oncology         [phone] ________________________________  Name: Melinda Kerr MRN: [id number]  Date: 10/08/2016  DOB: Feb 07, 1935  SIMULATION AND TREATMENT PLANNING NOTE HDR BRACHYTHERAPY  DIAGNOSIS: Stage IIIC1 high grade serous endometrial carcinoma  NARRATIVE:  The patient was brought to the [place] suite.  Identity was confirmed.  All relevant records and images related to the planned course of therapy were reviewed.  The patient freely provided informed written consent to proceed with treatment after reviewing the details related to the planned course of therapy. The consent form was witnessed and verified by the simulation staff.  Then, the patient was set-up in a stable reproducible  supine position for radiation therapy.  CT images were obtained.  Surface markings were placed.  The CT images were loaded into the planning software.  Then the target and avoidance structures were contoured.  Treatment planning then occurred.  The radiation prescription was entered and confirmed.   I have requested : Brachytherapy Isodose Plan and Dosimetry Calculations to plan the radiation distribution.    PLAN:  The patient will receive 30 Gy in 5 fractions. Patient will be treated with a 2.5 cm segmented cylinder. The patient will be treated with prescription of 6 gray to the mucosal surface. Treatment length will be 4 cm. Iridium 192 with the high-dose-rate source.  ________________________________  Blair Promise, PhD, MD

## 2016-10-08 NOTE — Progress Notes (Signed)
Radiation Oncology         [phone]) [phone] ________________________________  Name: Melinda Kerr MRN: [id number]  Date: 10/08/2016  DOB: 1934/12/29  Vaginal Brachytherapy Procedure Note  CC: [name], MD Gordy Levan, MD    ICD-9-CM ICD-10-CM   1. Endometrial cancer, FIGO stage IIIC (HCC) 182.0 C54.1     Diagnosis: Stage IIIC1 high grade serous endometrial carcinoma  Narrative: She returns today for vaginal cylinder fitting.   On review of systems, she denies having pain other than pain in her right hip with prolonged standing. She reports having urinary frequency. She denies bowel concerns. Denies vaginal bleeding. She reports having fatigue but says she is feeling much better since having chemotherapy. In wheelchair.  ALLERGIES: is allergic to oxybutynin.  Meds: Current Outpatient Prescriptions  Medication Sig Dispense Refill  . acetaminophen (TYLENOL) 500 MG tablet Take 1,000 mg by mouth 2 (two) times daily.     Marland Kitchen allopurinol (ZYLOPRIM) 300 MG tablet Take 300 mg by mouth daily with breakfast.     . calcium gluconate 500 MG tablet Takes 1 tablet in am and 2 in the evening.    . cetirizine (ZYRTEC) 10 MG tablet Take 10 mg by mouth daily.    . citalopram (CELEXA) 10 MG tablet Take 10 mg by mouth daily.    . Cyanocobalamin (VITAMIN B-12 PO) Take by mouth.    . docusate sodium (COLACE) 100 MG capsule Take 100 mg by mouth daily as needed for mild constipation.    . Ferrous Sulfate 90 (18 Fe) MG TABS Take 1 tablet by mouth daily.    . furosemide (LASIX) 20 MG tablet Take 20 mg by mouth daily.    Marland Kitchen HAWTHORNE BERRY PO Take 500 mg by mouth 2 (two) times daily.    . hydrOXYzine (ATARAX/VISTARIL) 25 MG tablet Take 25 mg by mouth at bedtime.    Marland Kitchen levothyroxine (SYNTHROID, LEVOTHROID) 125 MCG tablet Take 125 mcg by mouth daily before breakfast.    . lidocaine-prilocaine (EMLA) cream Apply 1-2 hours prior to [facility] access as directed 30 g 1  . lisinopril  (PRINIVIL,ZESTRIL) 20 MG tablet Take 20 mg by mouth daily with breakfast.     . Multiple Vitamins-Minerals (MULTIVITAMIN WITH MINERALS) tablet Take 1 tablet by mouth daily.    . Omega-3 Fatty Acids (FISH OIL PO) Take 1 tablet by mouth daily.    Marland Kitchen omeprazole (PRILOSEC OTC) 20 MG tablet Take 20 mg by mouth daily.    Marland Kitchen pyridOXINE (VITAMIN B-6) 100 MG tablet Take 100 mg by mouth daily.    Marland Kitchen tolterodine (DETROL LA) 4 MG 24 hr capsule Take 4 mg by mouth daily.    . traMADol (ULTRAM) 50 MG tablet Take 1-2 tablets (50-100 mg total) by mouth every 6 (six) hours as needed (mild pain). 80 tablet 1  . vitamin C (ASCORBIC ACID) 500 MG tablet Take 500 mg by mouth daily.    Marland Kitchen LORazepam (ATIVAN) 0.5 MG tablet Place 1 tablet under the tongue or swallow every 6 hrs as needed for nausea. Will Make drowsy. (Patient not taking: Reported on 10/08/2016) 20 tablet 0  . ondansetron (ZOFRAN) 8 MG tablet Take 1 tablet (8 mg total) by mouth every 8 (eight) hours as needed for nausea or vomiting. (Patient not taking: Reported on 10/08/2016) 30 tablet 1  . polyethylene glycol (MIRALAX / GLYCOLAX) packet Take 17 g by mouth daily as needed for mild constipation or moderate constipation. (Patient not taking: Reported on 10/08/2016) 14 each  0   No current facility-administered medications for this encounter.     Physical Findings: The patient is in no acute distress. Patient is alert and oriented.  height is 5\' 2"  (1.575 m) and weight is 241 lb (109.3 kg). Her oral temperature is 98.4 F (36.9 C). Her blood pressure is 106/72 and her pulse is 87. Her oxygen saturation is 95%.  No palpable cervical, supraclavicular or axillary lymphoadenopathy. The heart has a regular rate and rhythm. The lungs are clear to auscultation. Abdomen soft and non-tender.  On pelvic examination the external genitalia were unremarkable. A speculum exam was performed. Vaginal cuff intact, no mucosal lesions. On bimanual exam there were no pelvic masses  appreciated.  Patient was fitted for a vaginal cylinder. The patient will be treated with a 2.5 cm diameter cylinder with a treatment length of 4 cm. This distended the vaginal vault without undue discomfort. The patient tolerated the procedure well.  Lab Findings: Lab Results  Component Value Date   WBC 5.6 09/10/2016   HGB 10.5 (L) 09/10/2016   HCT 32.4 (L) 09/10/2016   MCV 101.6 (H) 09/10/2016   PLT 137 (L) 09/10/2016    Radiographic Findings: No results found.  Impression: Stage IIIC1 high grade serous endometrial carcinoma. The patient was successfully fitted for a vaginal cylinder. The patient is appropriate to begin vaginal brachytherapy.    Plan: The patient is enthusiastic about moving forward with vaginal brachytherapy. A consent form was discussed, signed, and placed in the patient's chart. The patient will proceed with CT simulation and vaginal brachytherapy today.   _______________________________   Blair Promise, PhD, MD  This document serves as a record of services personally performed by Gery Pray, MD. It was created on his behalf by Maryla Morrow, a trained medical scribe. The creation of this record is based on the scribe's personal observations and the provider's statements to them. This document has been checked and approved by the attending provider.

## 2016-10-08 NOTE — Progress Notes (Signed)
Please see the Nurse Progress Note in the MD Initial Consult Encounter for this patient. 

## 2016-10-09 ENCOUNTER — Encounter: Payer: Self-pay | Admitting: Radiation Oncology

## 2016-10-12 ENCOUNTER — Telehealth: Payer: Self-pay | Admitting: *Deleted

## 2016-10-12 ENCOUNTER — Encounter: Payer: Self-pay | Admitting: Radiation Oncology

## 2016-10-12 NOTE — Telephone Encounter (Signed)
Called patient to remind of HDR Tx. For 10-13-16 @ 1 pm, lvm for a return call

## 2016-10-13 ENCOUNTER — Ambulatory Visit
Admission: RE | Admit: 2016-10-13 | Discharge: 2016-10-13 | Disposition: A | Discharge status: Home / Self Care | Payer: Medicare Other | Source: Ambulatory Visit | Attending: Radiation Oncology | Admitting: Radiation Oncology

## 2016-10-13 DIAGNOSIS — E785 Hyperlipidemia, unspecified: Secondary | ICD-10-CM | POA: Diagnosis not present

## 2016-10-13 DIAGNOSIS — C541 Malignant neoplasm of endometrium: Secondary | ICD-10-CM

## 2016-10-13 DIAGNOSIS — Z79899 Other long term (current) drug therapy: Secondary | ICD-10-CM | POA: Diagnosis not present

## 2016-10-13 DIAGNOSIS — E119 Type 2 diabetes mellitus without complications: Secondary | ICD-10-CM | POA: Diagnosis not present

## 2016-10-13 DIAGNOSIS — K219 Gastro-esophageal reflux disease without esophagitis: Secondary | ICD-10-CM | POA: Diagnosis not present

## 2016-10-13 DIAGNOSIS — F329 Major depressive disorder, single episode, unspecified: Secondary | ICD-10-CM | POA: Diagnosis not present

## 2016-10-13 NOTE — Progress Notes (Signed)
  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 She will be treated with a 2.5 cm diameter segmented cylinder. This conforms to her anatomy without undue discomfort.  Vaginal brachytherapy procedure node: The patient was brought to the [place] suite. Identity was confirmed. All relevant records and images related to the planned course of therapy were reviewed. The patient freely provided informed written consent to proceed with treatment after reviewing the details related to the planned course of therapy. The consent form was witnessed and verified by the simulation staff. Then, the patient was set-up in a stable reproducible supine position for radiation therapy.  Pelvic exam revealed the vaginal cuff to be intact pelvic masses appreciated.The patient's custom vaginal cylinder was placed in the proximal vagina. This was affixed to the CT/MR stabilization plate to prevent slippage. Patient tolerated the placement well.  Verification simulation note:  A fiducial marker was placed within the vaginal cylinder. An AP and lateral film was then obtained through the pelvis area. This documented accurate position of the vaginal cylinder for treatment.  HDR BRACHYTHERAPY TREATMENT  The remote afterloading device was affixed to the vaginal cylinder by catheter. Patient then proceeded to undergo her second high-dose-rate treatment directed at the proximal vagina. The patient was prescribed a dose of 6 gray to be delivered to the mucosal surface. Treatment length was 4 cm. Patient was treated with 1 channel using 9 dwell positions. Treatment time was 165.2 seconds. Iridium 192 was the  high-dose-rate source for treatment. The patient tolerated the treatment well. After completion of her therapy, a radiation survey was performed documenting return of the iridium source into the Gamma[employer] safe.   PLAN: The patient will return for her third HDR treatment on 10/20/16. ________________________________  Blair Promise, PhD, MD   This document serves as a record of services personally performed by Gery Pray, MD. It was created on his behalf by Darcus Austin, a trained medical scribe. The creation of this record is based on the scribe's personal observations and the provider's statements to them. This document has been checked and approved by the attending provider.

## 2016-10-18 ENCOUNTER — Other Ambulatory Visit: Payer: Self-pay | Admitting: Oncology

## 2016-10-19 ENCOUNTER — Telehealth: Payer: Self-pay | Admitting: *Deleted

## 2016-10-19 NOTE — Telephone Encounter (Signed)
CALLED PATIENT TO REMIND OF HDR TX. FOR 10-20-16 @ 1 PM, LVM FOR A RETURN CALL

## 2016-10-20 ENCOUNTER — Ambulatory Visit
Admission: RE | Admit: 2016-10-20 | Discharge: 2016-10-20 | Disposition: A | Discharge status: Home / Self Care | Payer: Medicare Other | Source: Ambulatory Visit | Attending: Radiation Oncology | Admitting: Radiation Oncology

## 2016-10-20 DIAGNOSIS — F329 Major depressive disorder, single episode, unspecified: Secondary | ICD-10-CM | POA: Diagnosis not present

## 2016-10-20 DIAGNOSIS — E119 Type 2 diabetes mellitus without complications: Secondary | ICD-10-CM | POA: Diagnosis not present

## 2016-10-20 DIAGNOSIS — E785 Hyperlipidemia, unspecified: Secondary | ICD-10-CM | POA: Diagnosis not present

## 2016-10-20 DIAGNOSIS — C541 Malignant neoplasm of endometrium: Secondary | ICD-10-CM | POA: Diagnosis not present

## 2016-10-20 DIAGNOSIS — Z79899 Other long term (current) drug therapy: Secondary | ICD-10-CM | POA: Diagnosis not present

## 2016-10-20 DIAGNOSIS — K219 Gastro-esophageal reflux disease without esophagitis: Secondary | ICD-10-CM | POA: Diagnosis not present

## 2016-10-20 NOTE — Progress Notes (Signed)
  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 She will be treated with a 2.5 cm diameter segmented cylinder. This conforms to her anatomy without undue discomfort.  Vaginal brachytherapy procedure node: The patient was brought to the [place] suite. Identity was confirmed. All relevant records and images related to the planned course of therapy were reviewed. The patient freely provided informed written consent to proceed with treatment after reviewing the details related to the planned course of therapy. The consent form was witnessed and verified by the simulation staff. Then, the patient was set-up in a stable reproducible supine position for radiation therapy.  Pelvic exam revealed the vaginal cuff to be intact pelvic masses appreciated.The patient's custom vaginal cylinder was placed in the proximal vagina. This was affixed to the CT/MR stabilization plate to prevent slippage. Patient tolerated the placement well.  Verification simulation note:  A fiducial marker was placed within the vaginal cylinder. An AP and lateral film was then obtained through the pelvis area. This documented accurate position of the vaginal cylinder for treatment.  HDR BRACHYTHERAPY TREATMENT  The remote afterloading device was affixed to the vaginal cylinder by catheter. Patient then proceeded to undergo her third high-dose-rate treatment directed at the proximal vagina. The patient was prescribed a dose of 6 gray to be delivered to the mucosal surface. Treatment length was 4 cm. Patient was treated with 1 channel using 9 dwell positions. Treatment time was 176.4 seconds. Iridium 192 was the  high-dose-rate source for treatment. The patient tolerated the treatment well. After completion of her therapy, a radiation survey was performed documenting return of the iridium source into the Gamma[employer] safe.   PLAN: The patient will return for her fourth HDR treatment on 10/27/16. ________________________________  Blair Promise, PhD, MD   This document serves as a record of services personally performed by Gery Pray, MD. It was created on his behalf by Darcus Austin, a trained medical scribe. The creation of this record is based on the scribe's personal observations and the provider's statements to them. This document has been checked and approved by the attending provider.

## 2016-10-22 ENCOUNTER — Ambulatory Visit (HOSPITAL_BASED_OUTPATIENT_CLINIC_OR_DEPARTMENT_OTHER): Payer: Medicare Other | Admitting: Oncology

## 2016-10-22 ENCOUNTER — Other Ambulatory Visit (HOSPITAL_BASED_OUTPATIENT_CLINIC_OR_DEPARTMENT_OTHER): Payer: Medicare Other

## 2016-10-22 ENCOUNTER — Ambulatory Visit: Payer: Medicare Other

## 2016-10-22 VITALS — BP 118/93 | HR 82 | Temp 97.5°F | Resp 19 | Ht 62.0 in | Wt 241.6 lb

## 2016-10-22 DIAGNOSIS — Z95828 Presence of other vascular implants and grafts: Secondary | ICD-10-CM

## 2016-10-22 DIAGNOSIS — D6481 Anemia due to antineoplastic chemotherapy: Secondary | ICD-10-CM

## 2016-10-22 DIAGNOSIS — R7303 Prediabetes: Secondary | ICD-10-CM

## 2016-10-22 DIAGNOSIS — C541 Malignant neoplasm of endometrium: Secondary | ICD-10-CM

## 2016-10-22 DIAGNOSIS — I10 Essential (primary) hypertension: Secondary | ICD-10-CM

## 2016-10-22 DIAGNOSIS — Z86718 Personal history of other venous thrombosis and embolism: Secondary | ICD-10-CM

## 2016-10-22 DIAGNOSIS — Z6841 Body Mass Index (BMI) 40.0 and over, adult: Secondary | ICD-10-CM

## 2016-10-22 DIAGNOSIS — D638 Anemia in other chronic diseases classified elsewhere: Secondary | ICD-10-CM | POA: Diagnosis not present

## 2016-10-22 DIAGNOSIS — R350 Frequency of micturition: Secondary | ICD-10-CM | POA: Diagnosis not present

## 2016-10-22 LAB — CBC WITH DIFFERENTIAL/PLATELET
BASO%: 0.4 % (ref 0.0–2.0)
BASOS ABS: 0 10*3/uL (ref 0.0–0.1)
EOS ABS: 0.2 10*3/uL (ref 0.0–0.5)
EOS%: 3.4 % (ref 0.0–7.0)
HEMATOCRIT: 35.2 % (ref 34.8–46.6)
HEMOGLOBIN: 11.4 g/dL — AB (ref 11.6–15.9)
LYMPH%: 20.8 % (ref 14.0–49.7)
MCH: 32.1 pg (ref 25.1–34.0)
MCHC: 32.4 g/dL (ref 31.5–36.0)
MCV: 99.2 fL (ref 79.5–101.0)
MONO#: 0.5 10*3/uL (ref 0.1–0.9)
MONO%: 11 % (ref 0.0–14.0)
NEUT#: 3 10*3/uL (ref 1.5–6.5)
NEUT%: 64.4 % (ref 38.4–76.8)
Platelets: 173 10*3/uL (ref 145–400)
RBC: 3.55 10*6/uL — ABNORMAL LOW (ref 3.70–5.45)
RDW: 14.8 % — AB (ref 11.2–14.5)
WBC: 4.7 10*3/uL (ref 3.9–10.3)
lymph#: 1 10*3/uL (ref 0.9–3.3)

## 2016-10-22 LAB — COMPREHENSIVE METABOLIC PANEL
ALBUMIN: 3.4 g/dL — AB (ref 3.5–5.0)
ALK PHOS: 90 U/L (ref 40–150)
ALT: 13 U/L (ref 0–55)
AST: 18 U/L (ref 5–34)
Anion Gap: 8 mEq/L (ref 3–11)
BUN: 21.4 mg/dL (ref 7.0–26.0)
CALCIUM: 9.9 mg/dL (ref 8.4–10.4)
CO2: 26 mEq/L (ref 22–29)
Chloride: 106 mEq/L (ref 98–109)
Creatinine: 0.8 mg/dL (ref 0.6–1.1)
EGFR: 68 mL/min/{1.73_m2} — AB (ref >90)
Glucose: 142 mg/dl — ABNORMAL HIGH (ref 70–140)
POTASSIUM: 4.4 meq/L (ref 3.5–5.1)
Sodium: 140 mEq/L (ref 136–145)
Total Bilirubin: 0.46 mg/dL (ref 0.20–1.20)
Total Protein: 6.1 g/dL — ABNORMAL LOW (ref 6.4–8.3)

## 2016-10-22 MED ORDER — HEPARIN SOD (PORK) LOCK FLUSH 100 UNIT/ML IV SOLN
500.0000 [IU] | Freq: Once | INTRAVENOUS | Status: AC | PRN
  Administered 2016-10-22: 500 [IU] via INTRAVENOUS
  Filled 2016-10-22: qty 5

## 2016-10-22 MED ORDER — SODIUM CHLORIDE 0.9 % IJ SOLN
10.0000 mL | INTRAMUSCULAR | Status: DC | PRN
  Administered 2016-10-22: 10 mL via INTRAVENOUS
  Filled 2016-10-22: qty 10

## 2016-10-22 NOTE — Progress Notes (Signed)
OFFICE PROGRESS NOTE   October 22, 2016   Physicians:E. Rossi,J.Kinard, Maury Dus, MD; Manon Hilding, F.Alusio, R.Buccini, Alphonsa Overall  INTERVAL HISTORY:  Patient is seen, alone for visit, in follow up of  IIIC high grade serous endometrial carcinoma. She had 5 partial cycles of dose dense carbo taxol thru 08-10-16, tolerated so poorly that this was discontinued at that point.  CT AP 09-08-16 did not have clear residual or progressive disease. HDR by Dr Sondra Come will complete on 11-03-16. She will see Dr Denman George next 01-11-17. She saw PCP Dr Alyson Ingles in Dec.  Patient is feeling gradually stronger, with nothing that seems of concern from standpoint of the endometrial cancer. Appetite is good, bowels are moving regularly, no abdominal or pelvic pain, no increased SOB or other respiratory symptoms. She has no peripheral neuropathy now. She denies bleeding. No LE swelling. No problems with PAC. Hair is growing back. No fever or symptoms of infection. No change chronic degenerative arthritis. Remainder of 14 point Review of Systems negative  PAC placed by IR 05-15-16, flushed 10-22-16 No genetics testing. No MSI information on surgical path that I can locate Flu vaccine 06-29-16  ONCOLOGIC HISTORY Patient had one episode of heavyvaginal bleeding on 01-28-16, then two later episodes of spotting. She saw Dr Benjie Karvonen, with [place] and D&C 03-04-16 demonstrating high grade serous endometrial carcinoma ([id number]). She was referred to Dr Denman George, with CT CAP 03-24-16 with 20 mm endometrium, 2 mildly enlarged left external iliac nodes and a mildly enlarged left para aortic node, but without evidence of other distant disease. She had robotic total hysterectomy BSO and sentinel node evaluation by Dr Denman George on 03-26-16, with pathology [phone]) high grade serous carcinoma of endometrium extending into outer half of myometrium, + LVSI, involved left obturator sentinel node with 3 other sentinel nodes negative, ovaries and tubes not  involved. Post operative course was not remarkable. She completed 14 days of prophylactic lovenox postoperatively. SHe saw Dr Denman George in follow up on 04-20-16, doing well from surgery, recommendation for 6 cycles of carboplatin taxol and consideration of HDR at completion of chemo if doing well. Day 1 cycle 1 dose dense carbo taxol was given 05-04-16. She was neutropenic day 15 cycle 1, granix added. She had delays and missed doses thru cycle 4 despite increased granix and PRBC transfusion 07-21-16. Carbo dose reduced for day 1 cycle 5 after delay for thrombocytopenia, given on 08-10-16. Chemo DCd after day 1 cycle 5 due to poor tolerance and inadequate PS then.  CT AP 09-08-16 no clear residual disease tho 2 lymph nodes 9 mm each left inguinal and left periaortic. Vaginal brachytherapy to complete 11-03-16.  Objective:  Vital signs in last 24 hours:  BP (!) 118/93 (BP Location: Left Arm, Patient Position: Sitting)   Pulse 82   Temp 97.5 F (36.4 C) (Oral)   Resp 19   Ht '5\' 2"'  (1.575 m)   Wt 241 lb 9.6 oz (109.6 kg)   SpO2 100%   BMI 44.19 kg/m  Weight up 5 lbs Alert, oriented and appropriate, talkative and in good spirits, looks comfortable.. Ambulatory slowly with assistive device..  No alopecia  HEENT:PERRL, sclerae not icteric. Oral mucosa moist without lesions, posterior pharynx clear.  Neck supple. No JVD.  Lymphatics:no supraclavicular adenopathy Resp: clear to auscultation bilaterally and normal percussion bilaterally Cardio: regular rate and rhythm. No gallop. GI: abdomen obese, soft, nontender, not distended, cannot appreicate mass or organomegaly. Normally active bowel sounds. Surgical incisions not remarkable. Musculoskeletal/ Extremities: LE without pitting  edema, cords, tenderness Neuro: no peripheral neuropathy. Otherwise nonfocal. PSYCH appropriate mood and affect Skin without rash, ecchymosis, petechiae Portacath-without erythema or tenderness  Lab Results:  Results for  orders placed or performed in visit on 10/22/16  CBC with Differential  Result Value Ref Range   WBC 4.7 3.9 - 10.3 10e3/uL   NEUT# 3.0 1.5 - 6.5 10e3/uL   HGB 11.4 (L) 11.6 - 15.9 g/dL   HCT 35.2 34.8 - 46.6 %   Platelets 173 145 - 400 10e3/uL   MCV 99.2 79.5 - 101.0 fL   MCH 32.1 25.1 - 34.0 pg   MCHC 32.4 31.5 - 36.0 g/dL   RBC 3.55 (L) 3.70 - 5.45 10e6/uL   RDW 14.8 (H) 11.2 - 14.5 %   lymph# 1.0 0.9 - 3.3 10e3/uL   MONO# 0.5 0.1 - 0.9 10e3/uL   Eosinophils Absolute 0.2 0.0 - 0.5 10e3/uL   Basophils Absolute 0.0 0.0 - 0.1 10e3/uL   NEUT% 64.4 38.4 - 76.8 %   LYMPH% 20.8 14.0 - 49.7 %   MONO% 11.0 0.0 - 14.0 %   EOS% 3.4 0.0 - 7.0 %   BASO% 0.4 0.0 - 2.0 %  Comprehensive metabolic panel  Result Value Ref Range   Sodium 140 136 - 145 mEq/L   Potassium 4.4 3.5 - 5.1 mEq/L   Chloride 106 98 - 109 mEq/L   CO2 26 22 - 29 mEq/L   Glucose 142 (H) 70 - 140 mg/dl   BUN 21.4 7.0 - 26.0 mg/dL   Creatinine 0.8 0.6 - 1.1 mg/dL   Total Bilirubin 0.46 0.20 - 1.20 mg/dL   Alkaline Phosphatase 90 40 - 150 U/L   AST 18 5 - 34 U/L   ALT 13 0 - 55 U/L   Total Protein 6.1 (L) 6.4 - 8.3 g/dL   Albumin 3.4 (L) 3.5 - 5.0 g/dL   Calcium 9.9 8.4 - 10.4 mg/dL   Anion Gap 8 3 - 11 mEq/L   EGFR 68 (L) >90 ml/min/1.73 m2   Labs reviewed at time of visit  Studies/Results:  No results found.  Medications: I have reviewed the patient's current medications.  DISCUSSION Clinically continues to fell better out from chemo, tolerating HDR well.   She prefers to keep [facility] for now, due to very difficult peripheral IV access and high risk disease. She understands that this needs to be kept flushed every 6-8 weeks when not otherwise used.     Assessment/Plan:  1. IIIC1 high grade serous endometrial carcinoma: HDR to complete early Feb. Recovering well now from adjuvant chemotherapy. She will keep next appointment with Dr Denman George in April.  Will coordinate PAC flushes with other appointments as  possible, likely to see Dr Alvy Bimler after Dr Serita Grit visit unless gyn onc prefers to do all follow up thru their clinic. 2. Anemia:chemo, chronic disease. PRBCs 07-21-16 and 08-17-16. Hemoglobin up to 11.4 today. 3.history LE DVT 20 years ago.  4.borderline DM. transient elevation of blood sugars with steroids for taxol, SS insulin used with chemo infusions.   5.multilevel lumbar degenerative disc disease and scoliosis with chronic back pain. Known to Dr Geanie Kenning replacements 6.obstructive sleep apnea on CPAP 7.urinary frequency with related sleep disturbance, long-standing.  8. Post lap chole and umbilical hernia repair. HH. GERD controlled on prilosce 9.HTN controlled. Elevated lipids. CAD by CT. Atherosclerosis thoracic and abdominal aorta on CT 10.hypothyroid on synthroid by Dr Alyson Ingles 11.up to date colonoscopy, no polyps per patient 12.morbid obesity: discussed attention to  diet 13.PAC in place. Will need to flush every 6-8 weeks when not otherwise used.  14.flu vaccine 06-29-16   All questions answered and patient is in agreement with plans above, knows to call prior to next scheduled appointment if needed. Time spent 20 min including >50% counseling and coordination of care. Route Dr Alyson Ingles, in EMR for other MDs   Evlyn Clines, MD   10/22/2016, 5:32 PM

## 2016-10-25 ENCOUNTER — Encounter: Payer: Self-pay | Admitting: Oncology

## 2016-10-26 ENCOUNTER — Telehealth: Payer: Self-pay | Admitting: *Deleted

## 2016-10-26 NOTE — Telephone Encounter (Signed)
Called patient to remind of HDR Tx. For 10-27-16, lvm for a return call

## 2016-10-27 ENCOUNTER — Ambulatory Visit
Admission: RE | Admit: 2016-10-27 | Discharge: 2016-10-27 | Disposition: A | Discharge status: Home / Self Care | Payer: Medicare Other | Source: Ambulatory Visit | Attending: Radiation Oncology | Admitting: Radiation Oncology

## 2016-10-27 DIAGNOSIS — E119 Type 2 diabetes mellitus without complications: Secondary | ICD-10-CM | POA: Diagnosis not present

## 2016-10-27 DIAGNOSIS — C541 Malignant neoplasm of endometrium: Secondary | ICD-10-CM | POA: Diagnosis not present

## 2016-10-27 DIAGNOSIS — E785 Hyperlipidemia, unspecified: Secondary | ICD-10-CM | POA: Diagnosis not present

## 2016-10-27 DIAGNOSIS — Z79899 Other long term (current) drug therapy: Secondary | ICD-10-CM | POA: Diagnosis not present

## 2016-10-27 DIAGNOSIS — F329 Major depressive disorder, single episode, unspecified: Secondary | ICD-10-CM | POA: Diagnosis not present

## 2016-10-27 DIAGNOSIS — K219 Gastro-esophageal reflux disease without esophagitis: Secondary | ICD-10-CM | POA: Diagnosis not present

## 2016-10-27 NOTE — Progress Notes (Signed)
  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 She will be treated with a 2.5 cm diameter segmented cylinder. This conforms to her anatomy without undue discomfort.  Vaginal brachytherapy procedure node: The patient was brought to the [place] suite. Identity was confirmed. All relevant records and images related to the planned course of therapy were reviewed. The patient freely provided informed written consent to proceed with treatment after reviewing the details related to the planned course of therapy. The consent form was witnessed and verified by the simulation staff. Then, the patient was set-up in a stable reproducible supine position for radiation therapy.  Pelvic exam revealed the vaginal cuff to be intact pelvic masses appreciated.The patient's custom vaginal cylinder was placed in the proximal vagina. This was affixed to the CT/MR stabilization plate to prevent slippage. Patient tolerated the placement well.  Verification simulation note:  A fiducial marker was placed within the vaginal cylinder. An AP and lateral film was then obtained through the pelvis area. This documented accurate position of the vaginal cylinder for treatment.  HDR BRACHYTHERAPY TREATMENT  The remote afterloading device was affixed to the vaginal cylinder by catheter. Patient then proceeded to undergo her fourth high-dose-rate treatment directed at the proximal vagina. The patient was prescribed a dose of 6 gray to be delivered to the mucosal surface. Treatment length was 4 cm. Patient was treated with 1 channel using 9 dwell positions. Treatment time was 188.6 seconds. Iridium 192 was the  high-dose-rate source for treatment. The patient tolerated the treatment well. After completion of her therapy, a radiation survey was performed documenting return of the iridium source into the Gamma[employer] safe.   PLAN: The patient will return for her fifth HDR treatment on 11/03/16.  -----------------------------------  Blair Promise, PhD, MD  This document serves as a record of services personally performed by Gery Pray, MD. It was created on his behalf by Maryla Morrow, a trained medical scribe. The creation of this record is based on the scribe's personal observations and the provider's statements to them. This document has been checked and approved by the attending provider.

## 2016-11-02 ENCOUNTER — Telehealth: Payer: Self-pay | Admitting: *Deleted

## 2016-11-02 NOTE — Telephone Encounter (Signed)
CALLED PATIENT TO REMIND OF HDR TX. FOR 11-03-16, SPOKE WITH PATIENT AND SHE IS AWARE OF THIS APPT.

## 2016-11-03 ENCOUNTER — Ambulatory Visit
Admission: RE | Admit: 2016-11-03 | Discharge: 2016-11-03 | Disposition: A | Discharge status: Home / Self Care | Payer: Medicare Other | Source: Ambulatory Visit | Attending: Radiation Oncology | Admitting: Radiation Oncology

## 2016-11-03 DIAGNOSIS — E785 Hyperlipidemia, unspecified: Secondary | ICD-10-CM | POA: Diagnosis not present

## 2016-11-03 DIAGNOSIS — C541 Malignant neoplasm of endometrium: Secondary | ICD-10-CM

## 2016-11-03 DIAGNOSIS — E119 Type 2 diabetes mellitus without complications: Secondary | ICD-10-CM | POA: Diagnosis not present

## 2016-11-03 DIAGNOSIS — K219 Gastro-esophageal reflux disease without esophagitis: Secondary | ICD-10-CM | POA: Diagnosis not present

## 2016-11-03 DIAGNOSIS — Z79899 Other long term (current) drug therapy: Secondary | ICD-10-CM | POA: Diagnosis not present

## 2016-11-03 DIAGNOSIS — F329 Major depressive disorder, single episode, unspecified: Secondary | ICD-10-CM | POA: Diagnosis not present

## 2016-11-03 NOTE — Progress Notes (Signed)
  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 She will be treated with a 2.5 cm diameter segmented cylinder. This conforms to her anatomy without undue discomfort.  Vaginal brachytherapy procedure node: The patient was brought to the [place] suite. Identity was confirmed. All relevant records and images related to the planned course of therapy were reviewed. The patient freely provided informed written consent to proceed with treatment after reviewing the details related to the planned course of therapy. The consent form was witnessed and verified by the simulation staff. Then, the patient was set-up in a stable reproducible supine position for radiation therapy.  Pelvic exam revealed the vaginal cuff to be intact pelvic masses appreciated.The patient's custom vaginal cylinder was placed in the proximal vagina. This was affixed to the CT/MR stabilization plate to prevent slippage. Patient tolerated the placement well.  Verification simulation note:  A fiducial marker was placed within the vaginal cylinder. An AP and lateral film was then obtained through the pelvis area. This documented accurate position of the vaginal cylinder for treatment.  HDR BRACHYTHERAPY TREATMENT  The remote afterloading device was affixed to the vaginal cylinder by catheter. Patient then proceeded to undergo her fifth high-dose-rate treatment directed at the proximal vagina. The patient was prescribed a dose of 6 gray to be delivered to the mucosal surface. Treatment length was 4 cm. Patient was treated with 1 channel using 9 dwell positions. Treatment time was 201.6 seconds. Iridium 192 was the  high-dose-rate source for treatment. The patient tolerated the treatment well. After completion of her therapy, a radiation survey was performed documenting return of the iridium source into the Gamma[employer] safe.   PLAN: The patient has completed HDR treatment and will return to radiation oncology in 1 month for a follow up. She is scheduled to follow up with Dr. Denman George on 01/11/17.  -----------------------------------  Blair Promise, PhD, MD  This document serves as a record of services personally performed by Gery Pray, MD. It was created on his behalf by Darcus Austin, a trained medical scribe. The creation of this record is based on the scribe's personal observations and the provider's statements to them. This document has been checked and approved by the attending provider.

## 2016-11-05 ENCOUNTER — Encounter: Payer: Self-pay | Admitting: Radiation Oncology

## 2016-11-05 NOTE — Progress Notes (Signed)
  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 She was without complaint.  Plan: The patient has completed radiation treatment. The patient will return to radiation oncology clinic for routine followup in one month. I advised them to call or return sooner if they have any questions or concerns related to their recovery or treatment.  -----------------------------------  Blair Promise, PhD, MD  This document serves as a record of services personally performed by Gery Pray, MD. It was created on his behalf by Maryla Morrow, a trained medical scribe. The creation of this record is based on the scribe's personal observations and the provider's statements to them. This document has been checked and approved by the attending provider.

## 2016-11-26 ENCOUNTER — Telehealth: Payer: Self-pay | Admitting: *Deleted

## 2016-11-26 NOTE — Telephone Encounter (Signed)
Called patient to ask about which fu visit that she wants, she agreed to 12-10-16.

## 2016-12-03 ENCOUNTER — Telehealth: Payer: Self-pay | Admitting: Hematology and Oncology

## 2016-12-03 ENCOUNTER — Ambulatory Visit: Payer: Self-pay | Admitting: Radiation Oncology

## 2016-12-03 ENCOUNTER — Ambulatory Visit: Payer: Medicare Other | Admitting: Radiation Oncology

## 2016-12-03 NOTE — Telephone Encounter (Signed)
Patient needs to reschede her flush appointment today and please make it with her appointment with Dr Sondra Come next week

## 2016-12-08 ENCOUNTER — Telehealth: Payer: Self-pay | Admitting: Radiation Oncology

## 2016-12-08 NOTE — Telephone Encounter (Signed)
Patient called and wanted the appointment on 3/15 for 3:30 her appointment with Dr Sondra Come is at 4:30

## 2016-12-10 ENCOUNTER — Ambulatory Visit: Payer: Medicare Other | Admitting: Radiation Oncology

## 2016-12-10 ENCOUNTER — Ambulatory Visit (HOSPITAL_BASED_OUTPATIENT_CLINIC_OR_DEPARTMENT_OTHER): Payer: Medicare Other

## 2016-12-10 ENCOUNTER — Other Ambulatory Visit: Payer: Self-pay | Admitting: Hematology and Oncology

## 2016-12-10 ENCOUNTER — Ambulatory Visit
Admission: RE | Admit: 2016-12-10 | Discharge: 2016-12-10 | Disposition: A | Discharge status: Home / Self Care | Payer: Medicare Other | Source: Ambulatory Visit | Attending: Radiation Oncology | Admitting: Radiation Oncology

## 2016-12-10 VITALS — BP 117/70 | HR 88 | Temp 98.4°F | Resp 22 | Wt 241.2 lb

## 2016-12-10 DIAGNOSIS — Y842 Radiological procedure and radiotherapy as the cause of abnormal reaction of the patient, or of later complication, without mention of misadventure at the time of the procedure: Secondary | ICD-10-CM | POA: Diagnosis not present

## 2016-12-10 DIAGNOSIS — C541 Malignant neoplasm of endometrium: Secondary | ICD-10-CM

## 2016-12-10 DIAGNOSIS — Z452 Encounter for adjustment and management of vascular access device: Secondary | ICD-10-CM

## 2016-12-10 DIAGNOSIS — Z79899 Other long term (current) drug therapy: Secondary | ICD-10-CM | POA: Insufficient documentation

## 2016-12-10 DIAGNOSIS — Z95828 Presence of other vascular implants and grafts: Secondary | ICD-10-CM

## 2016-12-10 MED ORDER — SODIUM CHLORIDE 0.9 % IJ SOLN
10.0000 mL | INTRAMUSCULAR | Status: DC | PRN
  Filled 2016-12-10: qty 10

## 2016-12-10 MED ORDER — HEPARIN SOD (PORK) LOCK FLUSH 100 UNIT/ML IV SOLN
500.0000 [IU] | Freq: Once | INTRAVENOUS | Status: AC
  Administered 2016-12-10: 500 [IU]
  Filled 2016-12-10: qty 5

## 2016-12-10 MED ORDER — HEPARIN SOD (PORK) LOCK FLUSH 100 UNIT/ML IV SOLN
250.0000 [IU] | Freq: Once | INTRAVENOUS | Status: DC
  Filled 2016-12-10: qty 5

## 2016-12-10 MED ORDER — SODIUM CHLORIDE 0.9% FLUSH
10.0000 mL | Freq: Once | INTRAVENOUS | Status: AC
  Administered 2016-12-10: 10 mL
  Filled 2016-12-10: qty 10

## 2016-12-10 MED ORDER — HEPARIN SOD (PORK) LOCK FLUSH 100 UNIT/ML IV SOLN
500.0000 [IU] | INTRAVENOUS | Status: DC | PRN
  Filled 2016-12-10: qty 5

## 2016-12-10 NOTE — Progress Notes (Signed)
Melinda Kerr here today for 1 month follow up for endometrial cancer.  Patient denies any pain.  Patient reports having increased urine frequency.  Denies any blood in urine and stool.  Does report having urinary incontinence new since radiation.  States she takes stool softener as needed for mild constipation approximately once a wekk or drinks a boost supplement drink to help bowels move.  Denies any bowel incontinence.  Reports appetite is good.  Reports having a great deal of fatigue and usually returns to bed after getting up in the morning.  Upon walking to room patient was short of breath and had audible wheezing.  She states that it happens when she is walking a lot.  Oxygen saturation levels within normal limits.  Patient is completed with chemotherapy.     Vitals:   12/10/16 1610  BP: 117/70  Pulse: 88  Resp: (!) 22  Temp: 98.4 F (36.9 C)  TempSrc: Oral  SpO2: 96%  Weight: 241 lb 3.2 oz (109.4 kg)    Wt Readings from Last 3 Encounters:  12/10/16 241 lb 3.2 oz (109.4 kg)  10/22/16 241 lb 9.6 oz (109.6 kg)  10/08/16 241 lb (109.3 kg)

## 2016-12-10 NOTE — Progress Notes (Signed)
  Home Care Instructions for the Insertion and Care of Your Vaginal Dilator  Why Do I Need a Vaginal Dilator?  Internal radiation therapy may cause scar tissue to form at the top of your vagina (vaginal cuff).  This may make vaginal examinations difficult in the future. You can prevent scar tissue from forming by using a vaginal dilator (a smooth plastic rod), and/or by having regular sexual intercourse.  If not using the dilator you should be having intercourse two or three times a week.  If you are unable to have intercourse, you should use your vaginal dilator.  You may have some spotting or bleeding from your dilator or intercourse the first few times. You may also have some discomfort. If discomfort occurs with intercourse, you and your partner may need to stop for a while and try again later.  How to Use Your Vaginal Dilator  - Wash the dilator with soap and water before and after each use. - Check the dilator to be sure it is smooth. Do not use the dilator if you find any roughspots. - Coat the dilator with K-Y Jelly, Astroglide, or Replens. Do not use Vaseline, baby oil, or other oil based lubricants. They are not water-soluble and can be irritating to the tissues in the vagina. - Lie on your back with your knees bent and legs apart. - Insert the rounded end of the dilator into your vagina as far as it will go without causing pain or discomfort. - Close your knees and slowly straighten your legs. - Keep the dilator in your vagina for about 10 to 15 minutes. Please use it 3 times a week (for example, Monday, Wednesday and Friday). [facility] your knees, open your legs, and gently remove the dilator. - Gently cleanse the skin around the vaginal opening. - Wash the dilator after each use. -  It is important that you use the dilator routinely until instructed otherwise by your doctor.

## 2016-12-10 NOTE — Progress Notes (Signed)
Radiation Oncology         [phone] ________________________________  Name: Melinda Kerr MRN: [id number]  Date: 12/10/2016  DOB: 03/13/1935  Follow-Up Visit Note  CC: [name], MD  Gordy Levan, MD    ICD-9-CM ICD-10-CM   1. 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 She denies pain at this time. She notes new onset or urinary incontinence. She reports increased urinary frequency. She denies any blood in urine or stool. She reports taking a stool softener as needed for mild constipation. She denies bowel incontinence. She reports a good appetite. She notes significant fatigue and a nap in the morning. Per nursing, upon walking to the room the patient was short of breath with audible wheezing. Patient reports shortness of breath with exertion. Patient has completed chemotherapy.                              ALLERGIES:  is allergic to oxybutynin.  Meds: Current Outpatient Prescriptions  Medication Sig Dispense Refill  . acetaminophen (TYLENOL) 500 MG tablet Take 1,000 mg by mouth 2 (two) times daily.     Marland Kitchen allopurinol (ZYLOPRIM) 300 MG tablet Take 300 mg by mouth daily with breakfast.     . calcium gluconate 500 MG tablet Takes 1 tablet in am and 2 in the evening.    . cetirizine (ZYRTEC) 10 MG tablet Take 10 mg by mouth daily.    . citalopram (CELEXA) 10 MG tablet Take 10 mg by mouth daily.    . Cyanocobalamin (VITAMIN B-12 PO) Take by mouth.    . docusate sodium (COLACE) 100 MG capsule Take 100 mg by mouth daily as needed for mild constipation.    . Ferrous Sulfate 90 (18 Fe) MG TABS Take 1 tablet by mouth daily.    . furosemide (LASIX) 20 MG tablet Take 20 mg by mouth daily.    Marland Kitchen HAWTHORNE BERRY PO Take 500 mg by mouth 2  (two) times daily.    . hydrOXYzine (ATARAX/VISTARIL) 25 MG tablet Take 25 mg by mouth at bedtime.    Marland Kitchen levothyroxine (SYNTHROID, LEVOTHROID) 125 MCG tablet Take 125 mcg by mouth daily before breakfast.    . lidocaine-prilocaine (EMLA) cream Apply 1-2 hours prior to [facility] access as directed 30 g 1  . lisinopril (PRINIVIL,ZESTRIL) 20 MG tablet Take 20 mg by mouth daily with breakfast.     . Multiple Vitamins-Minerals (MULTIVITAMIN WITH MINERALS) tablet Take 1 tablet by mouth daily.    . Omega-3 Fatty Acids (FISH OIL PO) Take 1 tablet by mouth daily.    Marland Kitchen omeprazole (PRILOSEC OTC) 20 MG tablet Take 20 mg by mouth daily.    Marland Kitchen pyridOXINE (VITAMIN B-6) 100 MG tablet Take 100 mg by mouth daily.    Marland Kitchen tolterodine (DETROL LA) 4 MG 24 hr capsule Take 4 mg by mouth daily.    . traMADol (ULTRAM) 50 MG tablet Take 1-2 tablets (50-100 mg total) by mouth every 6 (six) hours as needed (mild pain). 80 tablet 1  . vitamin C (ASCORBIC ACID) 500 MG tablet Take 500 mg by mouth daily.    Marland Kitchen LORazepam (ATIVAN) 0.5 MG tablet Place 1 tablet under the tongue or swallow every  6 hrs as needed for nausea. Will Make drowsy. (Patient not taking: Reported on 10/08/2016) 20 tablet 0  . ondansetron (ZOFRAN) 8 MG tablet Take 1 tablet (8 mg total) by mouth every 8 (eight) hours as needed for nausea or vomiting. (Patient not taking: Reported on 10/08/2016) 30 tablet 1  . polyethylene glycol (MIRALAX / GLYCOLAX) packet Take 17 g by mouth daily as needed for mild constipation or moderate constipation. (Patient not taking: Reported on 10/08/2016) 14 each 0   No current facility-administered medications for this encounter.     Physical Findings: The patient is in no acute distress. Patient is alert and oriented.  weight is 241 lb 3.2 oz (109.4 kg). Her oral temperature is 98.4 F (36.9 C). Her blood pressure is 117/70 and her pulse is 88. Her respiration is 22 (abnormal) and oxygen saturation is 96%. .  No significant changes. Lungs  are clear to auscultation bilaterally. Heart has regular rate and rhythm. No palpable cervical, supraclavicular, or axillary adenopathy. Abdomen soft, non-tender, normal bowel sounds.    Lab Findings: Lab Results  Component Value Date   WBC 4.7 10/22/2016   HGB 11.4 (L) 10/22/2016   HCT 35.2 10/22/2016   MCV 99.2 10/22/2016   PLT 173 10/22/2016    Radiographic Findings: No results found.  Impression:  The patient is recovering from the effects of radiation. Pelvic exam was deferred today in light of her recent completion of treatment.  Plan: Follow up in radiation oncology in July 2018. Follow up with Dr. Denman George in April 2018. The patient was offered a referral to physical therapy for new onset of urinary incontinence. The patient denied a referral at this time. Patient was given a vaginal dilator with instructions on its use.  ____________________________________    This document serves as a record of services personally performed by Gery Pray, MD. It was created on his behalf by Bethann Humble, a trained medical scribe. The creation of this record is based on the scribe's personal observations and the provider's statements to them. This document has been checked and approved by the attending provider.

## 2016-12-11 ENCOUNTER — Telehealth: Payer: Self-pay | Admitting: *Deleted

## 2016-12-11 NOTE — Telephone Encounter (Signed)
CALLED PATIENT TO INFORM OF FU APPT. ON 04-15-17 @ 9 AM, NO ANSWER, MAILED APPT. CARD

## 2016-12-28 NOTE — Addendum Note (Signed)
Encounter addended by: Jacqulyn Liner, RN on: 12/28/2016  7:59 AM<BR>    Actions taken: Charge Capture section accepted

## 2017-01-11 ENCOUNTER — Encounter: Payer: Self-pay | Admitting: Gynecologic Oncology

## 2017-01-11 ENCOUNTER — Ambulatory Visit: Payer: Medicare Other

## 2017-01-11 ENCOUNTER — Ambulatory Visit: Payer: Medicare Other | Attending: Gynecologic Oncology | Admitting: Gynecologic Oncology

## 2017-01-11 VITALS — BP 110/78 | HR 86 | Temp 97.7°F | Resp 20 | Wt 244.3 lb

## 2017-01-11 DIAGNOSIS — Z9221 Personal history of antineoplastic chemotherapy: Secondary | ICD-10-CM

## 2017-01-11 DIAGNOSIS — Z90722 Acquired absence of ovaries, bilateral: Secondary | ICD-10-CM

## 2017-01-11 DIAGNOSIS — Z9049 Acquired absence of other specified parts of digestive tract: Secondary | ICD-10-CM | POA: Diagnosis not present

## 2017-01-11 DIAGNOSIS — Z6841 Body Mass Index (BMI) 40.0 and over, adult: Secondary | ICD-10-CM | POA: Insufficient documentation

## 2017-01-11 DIAGNOSIS — I10 Essential (primary) hypertension: Secondary | ICD-10-CM | POA: Insufficient documentation

## 2017-01-11 DIAGNOSIS — Z9851 Tubal ligation status: Secondary | ICD-10-CM | POA: Insufficient documentation

## 2017-01-11 DIAGNOSIS — Z9889 Other specified postprocedural states: Secondary | ICD-10-CM | POA: Insufficient documentation

## 2017-01-11 DIAGNOSIS — Z95828 Presence of other vascular implants and grafts: Secondary | ICD-10-CM

## 2017-01-11 DIAGNOSIS — C541 Malignant neoplasm of endometrium: Secondary | ICD-10-CM | POA: Diagnosis not present

## 2017-01-11 DIAGNOSIS — E119 Type 2 diabetes mellitus without complications: Secondary | ICD-10-CM | POA: Diagnosis not present

## 2017-01-11 DIAGNOSIS — Z90710 Acquired absence of both cervix and uterus: Secondary | ICD-10-CM | POA: Diagnosis not present

## 2017-01-11 MED ORDER — HEPARIN SOD (PORK) LOCK FLUSH 100 UNIT/ML IV SOLN
500.0000 [IU] | Freq: Once | INTRAVENOUS | Status: AC
  Administered 2017-01-11: 500 [IU] via INTRAVENOUS
  Filled 2017-01-11: qty 5

## 2017-01-11 MED ORDER — SODIUM CHLORIDE 0.9% FLUSH
10.0000 mL | INTRAVENOUS | Status: DC | PRN
  Administered 2017-01-11: 10 mL via INTRAVENOUS
  Filled 2017-01-11: qty 10

## 2017-01-11 NOTE — Patient Instructions (Signed)
Implanted Port Home Guide An implanted port is a type of central line that is placed under the skin. Central lines are used to provide IV access when treatment or nutrition needs to be given through a person's veins. Implanted ports are used for long-term IV access. An implanted port may be placed because:  You need IV medicine that would be irritating to the small veins in your hands or arms.  You need long-term IV medicines, such as antibiotics.  You need IV nutrition for a long period.  You need frequent blood draws for lab tests.  You need dialysis.  Implanted ports are usually placed in the chest area, but they can also be placed in the upper arm, the abdomen, or the leg. An implanted port has two main parts:  Reservoir. The reservoir is round and will appear as a small, raised area under your skin. The reservoir is the part where a needle is inserted to give medicines or draw blood.  Catheter. The catheter is a thin, flexible tube that extends from the reservoir. The catheter is placed into a large vein. Medicine that is inserted into the reservoir goes into the catheter and then into the vein.  How will I care for my incision site? Do not get the incision site wet. Bathe or shower as directed by your health care provider. How is my port accessed? Special steps must be taken to access the port:  Before the port is accessed, a numbing cream can be placed on the skin. This helps numb the skin over the port site.  Your health care provider uses a sterile technique to access the port. ? Your health care provider must put on a mask and sterile gloves. ? The skin over your port is cleaned carefully with an antiseptic and allowed to dry. ? The port is gently pinched between sterile gloves, and a needle is inserted into the port.  Only "non-coring" port needles should be used to access the port. Once the port is accessed, a blood return should be checked. This helps ensure that the port  is in the vein and is not clogged.  If your port needs to remain accessed for a constant infusion, a clear (transparent) bandage will be placed over the needle site. The bandage and needle will need to be changed every week, or as directed by your health care provider.  Keep the bandage covering the needle clean and dry. Do not get it wet. Follow your health care provider's instructions on how to take a shower or bath while the port is accessed.  If your port does not need to stay accessed, no bandage is needed over the port.  What is flushing? Flushing helps keep the port from getting clogged. Follow your health care provider's instructions on how and when to flush the port. Ports are usually flushed with saline solution or a medicine called heparin. The need for flushing will depend on how the port is used.  If the port is used for intermittent medicines or blood draws, the port will need to be flushed: ? After medicines have been given. ? After blood has been drawn. ? As part of routine maintenance.  If a constant infusion is running, the port may not need to be flushed.  How long will my port stay implanted? The port can stay in for as long as your health care provider thinks it is needed. When it is time for the port to come out, surgery will be   done to remove it. The procedure is similar to the one performed when the port was put in. When should I seek immediate medical care? When you have an implanted port, you should seek immediate medical care if:  You notice a bad smell coming from the incision site.  You have swelling, redness, or drainage at the incision site.  You have more swelling or pain at the port site or the surrounding area.  You have a fever that is not controlled with medicine.  This information is not intended to replace advice given to you by your health care provider. Make sure you discuss any questions you have with your health care provider. Document  Released: 09/14/2005 Document Revised: 02/20/2016 Document Reviewed: 05/22/2013 Elsevier Interactive Patient Education  2017 [employer]vier Inc[employer]  

## 2017-01-11 NOTE — Progress Notes (Signed)
Follow-up Note: Gyn-Onc  Consult was initially requested by Dr. Benjie Karvonen for the evaluation of Melinda Kerr 82 y.o. female  CC:  Chief Complaint  Patient presents with  . Endometrial cancer    Assessment/Plan:  Ms. Melinda Kerr  is a 82 y.o.  year old morbidly obese woman (BMI 66) with stage IIIC1 high grade serous endometrial cancer s/p robotic assisted total hysterectomy, BSO, SLN biopsy with a positive left obturator SLN (macrometastatic) and deeply invasive tumor with LVSI present s/p adjuvant chemotherapy with 6 cycles carboplatin and paclitaxel and vaginal brachytherapy.  Continue 3 monthly surveillance until February, 2020.  I will see her back in October, 2018. She will see Dr Sondra Come in July, 2018.  HPI: Melinda Kerr is a 82 year old G0 who is seen in consultation of Dr Benjie Karvonen for serous endometrial cancer in the setting of morbid obesity (BMI 44). The patient reports a history of postmenopausal bleeding on 01/28/16. She then saw Dr Benjie Karvonen who performed a TVUS on 02/13/16 which revealed 6.2 x 5.3 x 4cm and a thickened endometrial stripe. There was an echogenic mass measuring 22.8cm felt to represent a dermoid cyst on the left ovary. The right ovary was normal. A D&C was performed on 03/04/16 and showed high grade serous carcinoma.   She has morbid obesity with a BMI of 44kg/m2. She has HTN, and a remote history of a spontaneous DVT treated with 6 months of coumadin. She has chronic back pain and scoliosis. The patient's surgical history is most notable for a laparoscopic cholecystectomy and an umbilical hernia repair without mesh in 2007.  Prior to surgery she underwent CT staging with a CT abdo/pelvis and chest. This showed some suspicious/borderline appearing nodes in her pelvic lymph nodes and at the left PA region (1.1cm in largest dimension).   On 03/26/16 she underwent robotic assisted total hysterectomy, BSO and SLN biopsy. Intraoperative findings were significant for a clinically  suspicious left obturator SLN. Final pathology confirmed a stage IIIC1 high grade serous carcinoma of the endometrium with a 3.8cm tumor invading 1.5 of 1.8cm of myometrial thickness with LVSI present. The ovaries and cervix were free of tumor, however the left obturator SLN contained a macrometastatic (>80mm) focus of disease.   She completed 6 cycles of chemotherapy (carboplatin and paclitaxel) between 05-04-16 thru 08-10-16. Treatment was complicated by bone marrow suppression, bone pains.  CT scan on 09/08/16 showed no gross metastases or recurrence.   Interval Hx: She completed vaginal brachytherapy with Dr Sondra Come with in February, 2018. She has no complaints other than fatigue.  Current Meds:  Outpatient Encounter Prescriptions as of 01/11/2017  Medication Sig  . acetaminophen (TYLENOL) 500 MG tablet Take 1,000 mg by mouth 2 (two) times daily.   Marland Kitchen allopurinol (ZYLOPRIM) 300 MG tablet Take 300 mg by mouth daily with breakfast.   . calcium gluconate 500 MG tablet Takes 1 tablet in am and 2 in the evening.  . cetirizine (ZYRTEC) 10 MG tablet Take 10 mg by mouth daily.  . citalopram (CELEXA) 10 MG tablet Take 10 mg by mouth daily.  . Cyanocobalamin (VITAMIN B-12 PO) Take by mouth.  . docusate sodium (COLACE) 100 MG capsule Take 100 mg by mouth daily as needed for mild constipation.  . Ferrous Sulfate 90 (18 Fe) MG TABS Take 1 tablet by mouth daily.  . furosemide (LASIX) 20 MG tablet Take 20 mg by mouth daily.  Marland Kitchen HAWTHORNE BERRY PO Take 500 mg by mouth 2 (two) times daily.  Marland Kitchen  hydrOXYzine (ATARAX/VISTARIL) 25 MG tablet Take 25 mg by mouth at bedtime.  Marland Kitchen levothyroxine (SYNTHROID, LEVOTHROID) 125 MCG tablet Take 125 mcg by mouth daily before breakfast.  . lidocaine-prilocaine (EMLA) cream Apply 1-2 hours prior to [facility] access as directed  . lisinopril (PRINIVIL,ZESTRIL) 20 MG tablet Take 20 mg by mouth daily with breakfast.   . LORazepam (ATIVAN) 0.5 MG tablet Place 1 tablet under the  tongue or swallow every 6 hrs as needed for nausea. Will Make drowsy.  . Multiple Vitamins-Minerals (MULTIVITAMIN WITH MINERALS) tablet Take 1 tablet by mouth daily.  . Omega-3 Fatty Acids (FISH OIL PO) Take 1 tablet by mouth daily.  Marland Kitchen omeprazole (PRILOSEC OTC) 20 MG tablet Take 20 mg by mouth daily.  . ondansetron (ZOFRAN) 8 MG tablet Take 1 tablet (8 mg total) by mouth every 8 (eight) hours as needed for nausea or vomiting.  . polyethylene glycol (MIRALAX / GLYCOLAX) packet Take 17 g by mouth daily as needed for mild constipation or moderate constipation.  Marland Kitchen pyridOXINE (VITAMIN B-6) 100 MG tablet Take 100 mg by mouth daily.  Marland Kitchen tolterodine (DETROL LA) 4 MG 24 hr capsule Take 4 mg by mouth daily.  . traMADol (ULTRAM) 50 MG tablet Take 1-2 tablets (50-100 mg total) by mouth every 6 (six) hours as needed (mild pain).  . vitamin C (ASCORBIC ACID) 500 MG tablet Take 500 mg by mouth daily.   No facility-administered encounter medications on file as of 01/11/2017.     Allergy:  Allergies  Allergen Reactions  . Oxybutynin     Bleeding gums, felt sick    Social Hx:   Social History   Social History  . Marital status: Widowed    Spouse name: N/A  . Number of children: N/A  . Years of education: N/A   Occupational History  . Not on file.   Social History Main Topics  . Smoking status: Never Smoker  . Smokeless tobacco: Never Used  . Alcohol use Not on file  . Drug use: Unknown  . Sexual activity: Not Currently   Other Topics Concern  . Not on file   Social History Narrative  . No narrative on file    Past Surgical Hx:  Past Surgical History:  Procedure Laterality Date  . CHOLECYSTECTOMY  age 73  . DILATATION & CURETTAGE/HYSTEROSCOPY WITH MYOSURE N/A 03/04/2016   Procedure: DILATATION & CURETTAGE/HYSTEROSCOPY ;  Surgeon: Azucena Fallen, MD;  Location: [facility];  Service: Gynecology;  Laterality: N/A;  polyp removal   . HERNIA REPAIR  age 22   umblical  . IR GENERIC HISTORICAL   05/15/2016   IR US GUIDE VASC ACCESS RIGHT 05/15/2016 Aletta Edouard, MD [facility]  . IR GENERIC HISTORICAL  05/15/2016   IR FLUORO GUIDE CV LINE RIGHT 05/15/2016 Aletta Edouard, MD [facility]  . LYMPH NODE BIOPSY N/A 03/26/2016   Procedure: Sentinel LYMPH NODE BIOPSY;  Surgeon: Everitt Amber, MD;  Location: [facility];  Service: Gynecology;  Laterality: N/A;  . ROBOTIC ASSISTED TOTAL HYSTERECTOMY WITH BILATERAL SALPINGO OOPHERECTOMY N/A 03/26/2016   Procedure: XI ROBOTIC ASSISTED TOTAL Laproscopic HYSTERECTOMY WITH BILATERAL SALPINGO OOPHORECTOMY;  Surgeon: Everitt Amber, MD;  Location: [facility];  Service: Gynecology;  Laterality: N/A;  . TOTAL KNEE ARTHROPLASTY Left 10/09/2013   Procedure: LEFT TOTAL KNEE ARTHROPLASTY;  Surgeon: [name], MD;  Location: [facility];  Service: Orthopedics;  Laterality: Left;  . TOTAL KNEE ARTHROPLASTY Right 04/09/2014   Procedure: RIGHT TOTAL KNEE ARTHROPLASTY;  Surgeon: Gaynelle Arabian  V, MD;  Location: [facility];  Service: Orthopedics;  Laterality: Right;  . TUBAL LIGATION  age 76  . VARICOSE VEIN SURGERY Bilateral age 14  . WISDOM TOOTH EXTRACTION  age 81    Past Medical Hx:  Past Medical History:  Diagnosis Date  . Cancer [facility])    endometrial cancer   . Depression   . Diabetes mellitus without complication (HCC)    borderline , diet controlled   . Esophageal reflux   . Hyperlipidemia    under control  . Hypertension   . Hypothyroidism   . Morbid obesity with BMI of 40.0-44.9, adult ([place])   . Nocturia   . OSA on CPAP   . Osteoarthritis   . Phlebitis    hx of  . Pneumonia    hx of   . Prediabetes    under control with diet  . Spondylosis    with scoliosis  . Urinary frequency     Past Gynecological History:  G0  No LMP recorded. Patient is postmenopausal.  Family Hx:  Family History  Problem Relation Age of Onset  . Heart disease Mother   . Heart disease Father   . CVA Father     Review of Systems:  Constitutional  Feels well,  + fatigue   ENT Normal appearing ears and nares bilaterally Skin/Breast  No rash, sores, jaundice, itching, dryness Cardiovascular  No chest pain, shortness of breath, or edema  Pulmonary  No cough or wheeze.  Gastro Intestinal  No nausea, vomitting, or diarrhoea. No bright red blood per rectum, no abdominal pain, change in bowel movement, or constipation.  Genito Urinary  No frequency, urgency, dysuria, no bleeding Musculo Skeletal  No myalgia, arthralgia, joint swelling or pain  Neurologic  No weakness, numbness, change in gait,  Psychology  No depression, anxiety, insomnia.   Vitals:  Blood pressure 110/78, pulse 86, temperature 97.7 F (36.5 C), temperature source Oral, resp. rate 20, weight 244 lb 4.8 oz (110.8 kg).  Physical Exam: WD in NAD Neck  Supple NROM, without any enlargements.  Lymph Node Survey No cervical supraclavicular or inguinal adenopathy Cardiovascular  Pulse normal rate, regularity and rhythm. S1 and S2 normal.  Lungs  Clear to auscultation bilateraly. + audible wheezing on exertion. Increased work of breathing when positioning on bed. Good air movement.  Skin  No rash/lesions/breakdown  Psychiatry  Alert and oriented to person, place, and time  Abdomen  Normoactive bowel sounds, abdomen soft, non-tender and obese with pannus folds without evidence of hernia. Well healed incision sites. Back No CVA tenderness Genito Urinary  Vulva/vagina: Normal external female genitalia.  No lesions. No discharge or bleeding.  Bladder/urethra:  No lesions or masses, well supported bladder  Surgically absent cervix and uterus with no masses or lesions.  Vaginal cuff with no lesions  No pelvic masses Rectal  deferred Extremities  No bilateral cyanosis, clubbing or edema.   Donaciano Eva, MD  01/11/2017, 2:46 PM

## 2017-01-11 NOTE — Patient Instructions (Signed)
Please follow-up with Dr Denman George in October, 2018. You can request that this appointment be made after your radiation oncology appointment in July, 2018.

## 2017-01-13 ENCOUNTER — Ambulatory Visit (INDEPENDENT_AMBULATORY_CARE_PROVIDER_SITE_OTHER): Payer: Medicare Other | Admitting: Podiatry

## 2017-01-13 DIAGNOSIS — M79609 Pain in unspecified limb: Secondary | ICD-10-CM | POA: Diagnosis not present

## 2017-01-13 DIAGNOSIS — B351 Tinea unguium: Secondary | ICD-10-CM

## 2017-01-13 DIAGNOSIS — L608 Other nail disorders: Secondary | ICD-10-CM

## 2017-01-13 DIAGNOSIS — L603 Nail dystrophy: Secondary | ICD-10-CM

## 2017-01-14 DIAGNOSIS — M9901 Segmental and somatic dysfunction of cervical region: Secondary | ICD-10-CM | POA: Diagnosis not present

## 2017-01-14 DIAGNOSIS — M5136 Other intervertebral disc degeneration, lumbar region: Secondary | ICD-10-CM | POA: Diagnosis not present

## 2017-01-14 DIAGNOSIS — M9903 Segmental and somatic dysfunction of lumbar region: Secondary | ICD-10-CM | POA: Diagnosis not present

## 2017-01-14 DIAGNOSIS — M5031 Other cervical disc degeneration,  high cervical region: Secondary | ICD-10-CM | POA: Diagnosis not present

## 2017-01-14 NOTE — Progress Notes (Signed)
   SUBJECTIVE Patient  presents to office today complaining of elongated, thickened nails. She reports pain to bilateral great toenails. She also expresses concern about possible calluses to the medial side of bilateral great toes. Patient is unable to trim their own nails.   OBJECTIVE General Patient is awake, alert, and oriented x 3 and in no acute distress. Derm Skin is dry and supple bilateral. Negative open lesions or macerations. Remaining integument unremarkable. Nails are tender, long, thickened and dystrophic with subungual debris, consistent with onychomycosis, 1-5 bilateral. No signs of infection noted. Vasc  DP and PT pedal pulses palpable bilaterally. Temperature gradient within normal limits.  Neuro Epicritic and protective threshold sensation diminished bilaterally.  Musculoskeletal Exam No symptomatic pedal deformities noted bilateral. Muscular strength within normal limits.  ASSESSMENT 1. Onychodystrophic nails 1-5 bilateral with hyperkeratosis of nails.  2. Onychomycosis of nail due to dermatophyte bilateral 3. Pain in foot bilateral  PLAN OF CARE 1. Patient evaluated today.  2. Instructed to maintain good pedal hygiene and foot care.  3. Mechanical debridement of nails 1-5 bilaterally performed using a nail nipper. Filed with dremel without incident.  4. Return to clinic in 3 mos.    Edrick Kins, DPM [facility]  Dr. Edrick Kins, [address]                                        [place], [place] [address]                Office [phone]  Fax [phone]

## 2017-01-21 DIAGNOSIS — M5136 Other intervertebral disc degeneration, lumbar region: Secondary | ICD-10-CM | POA: Diagnosis not present

## 2017-01-21 DIAGNOSIS — M9903 Segmental and somatic dysfunction of lumbar region: Secondary | ICD-10-CM | POA: Diagnosis not present

## 2017-01-21 DIAGNOSIS — M5031 Other cervical disc degeneration,  high cervical region: Secondary | ICD-10-CM | POA: Diagnosis not present

## 2017-01-21 DIAGNOSIS — M9901 Segmental and somatic dysfunction of cervical region: Secondary | ICD-10-CM | POA: Diagnosis not present

## 2017-01-27 DIAGNOSIS — M9903 Segmental and somatic dysfunction of lumbar region: Secondary | ICD-10-CM | POA: Diagnosis not present

## 2017-01-27 DIAGNOSIS — M9901 Segmental and somatic dysfunction of cervical region: Secondary | ICD-10-CM | POA: Diagnosis not present

## 2017-01-27 DIAGNOSIS — M5136 Other intervertebral disc degeneration, lumbar region: Secondary | ICD-10-CM | POA: Diagnosis not present

## 2017-01-27 DIAGNOSIS — M5031 Other cervical disc degeneration,  high cervical region: Secondary | ICD-10-CM | POA: Diagnosis not present

## 2017-02-03 DIAGNOSIS — M5031 Other cervical disc degeneration,  high cervical region: Secondary | ICD-10-CM | POA: Diagnosis not present

## 2017-02-03 DIAGNOSIS — M9903 Segmental and somatic dysfunction of lumbar region: Secondary | ICD-10-CM | POA: Diagnosis not present

## 2017-02-03 DIAGNOSIS — M5136 Other intervertebral disc degeneration, lumbar region: Secondary | ICD-10-CM | POA: Diagnosis not present

## 2017-02-03 DIAGNOSIS — M9901 Segmental and somatic dysfunction of cervical region: Secondary | ICD-10-CM | POA: Diagnosis not present

## 2017-02-15 DIAGNOSIS — H52203 Unspecified astigmatism, bilateral: Secondary | ICD-10-CM | POA: Diagnosis not present

## 2017-02-15 DIAGNOSIS — H5213 Myopia, bilateral: Secondary | ICD-10-CM | POA: Diagnosis not present

## 2017-02-15 DIAGNOSIS — H2513 Age-related nuclear cataract, bilateral: Secondary | ICD-10-CM | POA: Diagnosis not present

## 2017-02-17 DIAGNOSIS — M9901 Segmental and somatic dysfunction of cervical region: Secondary | ICD-10-CM | POA: Diagnosis not present

## 2017-02-17 DIAGNOSIS — M9903 Segmental and somatic dysfunction of lumbar region: Secondary | ICD-10-CM | POA: Diagnosis not present

## 2017-02-17 DIAGNOSIS — M5136 Other intervertebral disc degeneration, lumbar region: Secondary | ICD-10-CM | POA: Diagnosis not present

## 2017-02-17 DIAGNOSIS — M5031 Other cervical disc degeneration,  high cervical region: Secondary | ICD-10-CM | POA: Diagnosis not present

## 2017-02-21 IMAGING — CT CT ABD-PELV W/ CM
2 of 3 series · 14 of 31 positions shown, 17 images · IV contrast (iopamidol)
Comparison: No prior CT of the chest, abdomen or pelvis.

CLINICAL DATA: 81-year-old female with newly diagnosed high-grade
serous endometrial cancer, presenting for staging. History of
umbilical hernia repair in cholecystectomy.

EXAM:
CT CHEST, ABDOMEN, AND PELVIS WITH CONTRAST
TECHNIQUE: Multidetector CT imaging of the chest, abdomen and pelvis was
performed following the standard protocol during bolus
administration of intravenous contrast.
CONTRAST:  100mL 4TUHNJ-X99 IOPAMIDOL (4TUHNJ-X99) INJECTION 61%

[Series 2: cap with st · axial · 0.77mm/px · z∈[-537,-82]mm · 10 of 115 slices shown, 13 images]
[im 12/115  mediastinal]
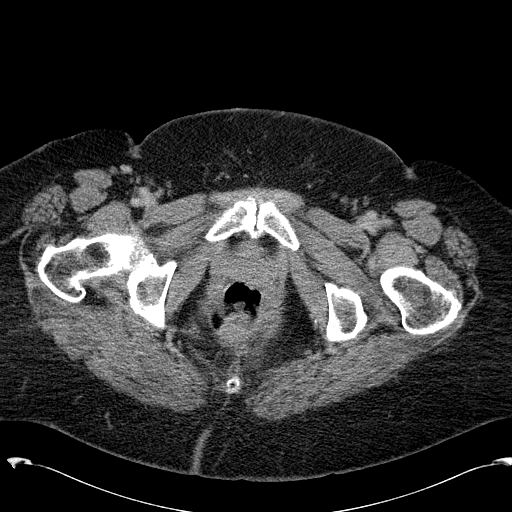
[im 12/115  lung]
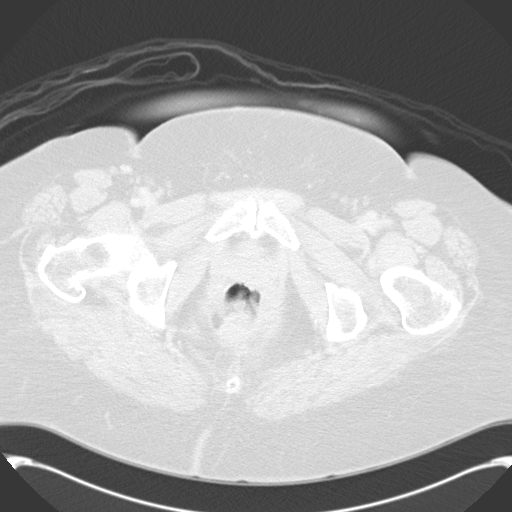
[im 23/115  lung]
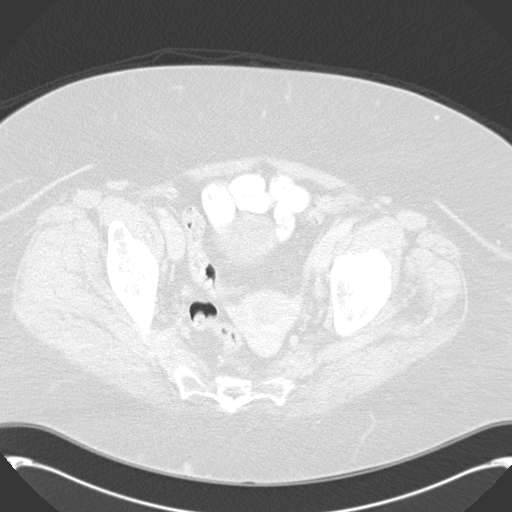
[im 35/115  lung]
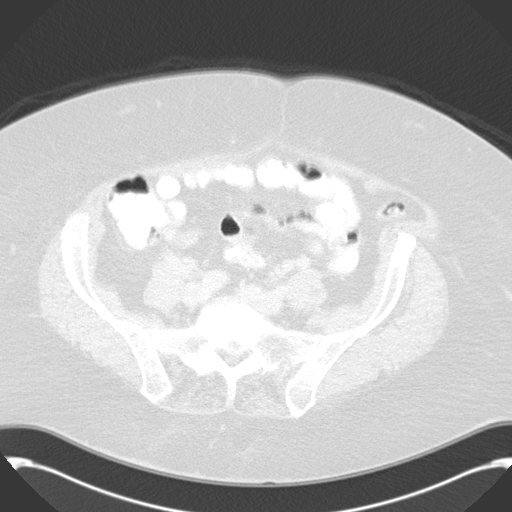
[im 46/115  lung]
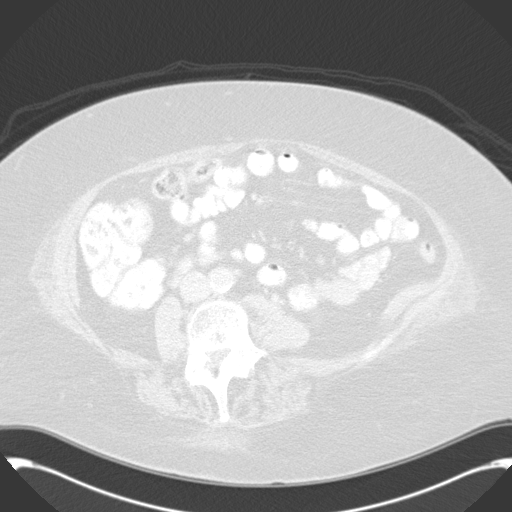
[im 57/115  mediastinal]
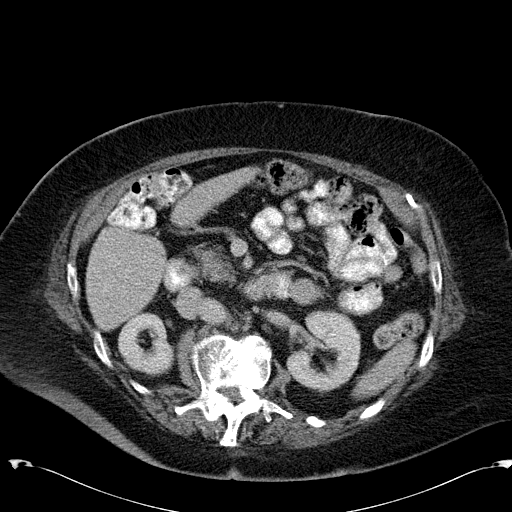
[im 57/115  lung]
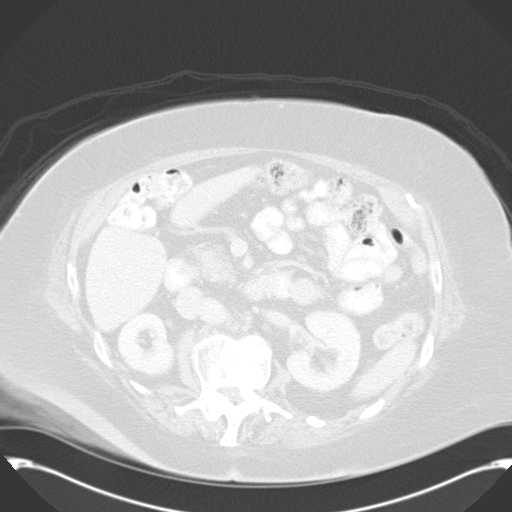
[im 58/115  lung]
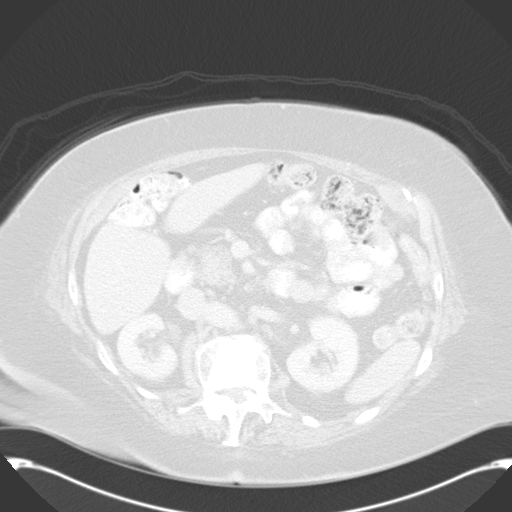
[im 69/115  lung]
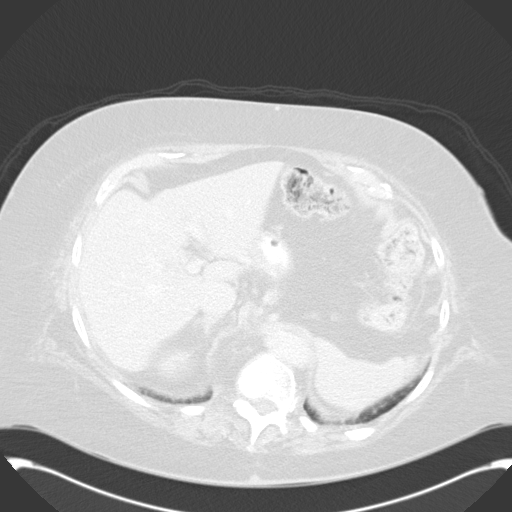
[im 80/115  lung]
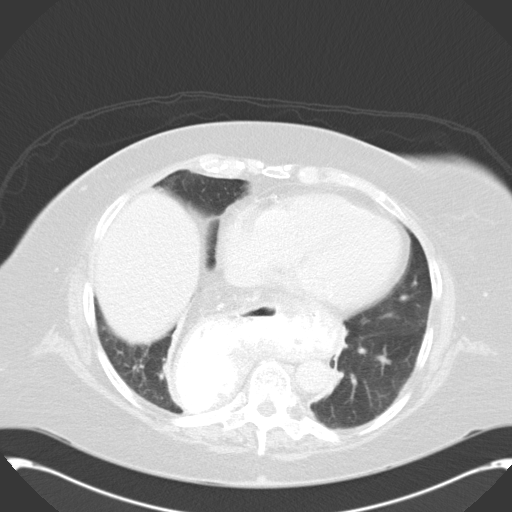
[im 92/115  mediastinal]
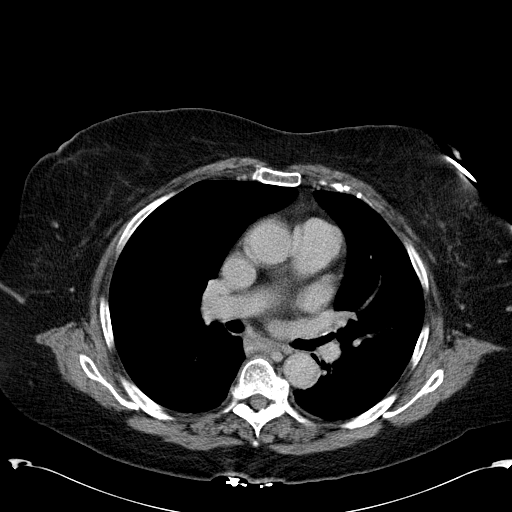
[im 92/115  lung]
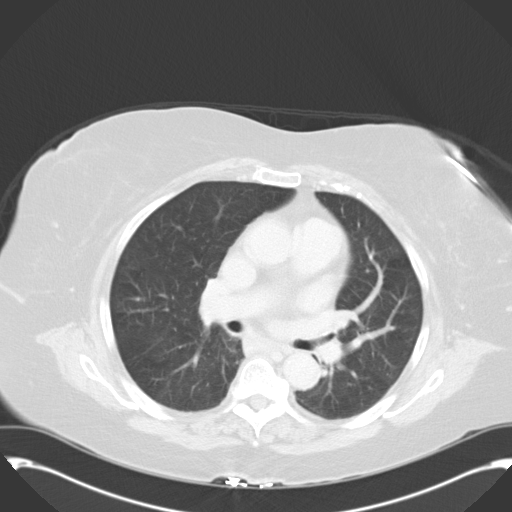
[im 103/115  lung]
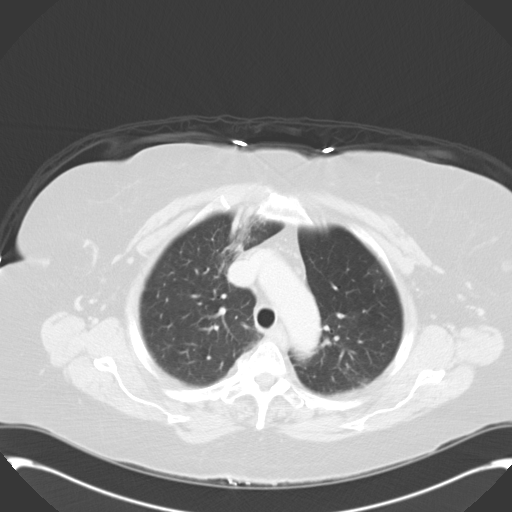

[Series 5: lung windows · axial · 0.77mm/px · z∈[-273,-165]mm · 4 of 141 slices shown]
[im 11/141  lung]
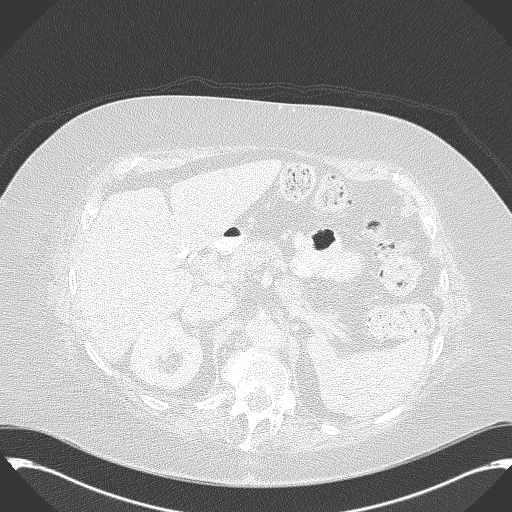
[im 33/141  lung]
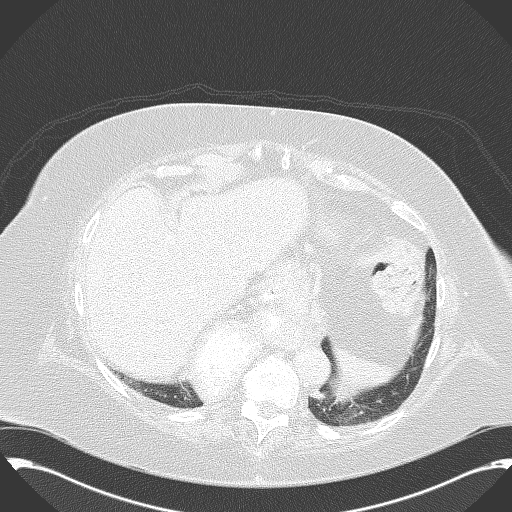
[im 54/141  lung]
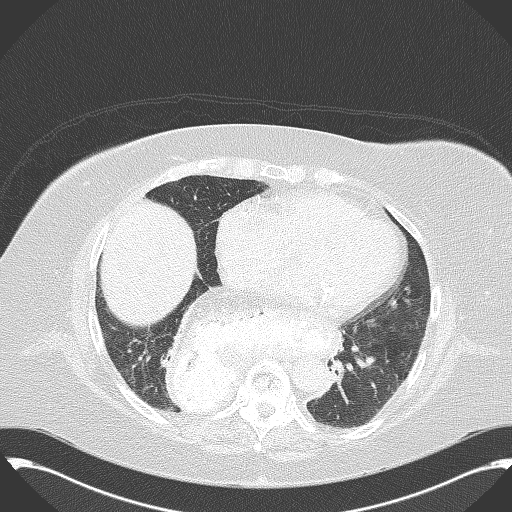
[im 65/141  lung]
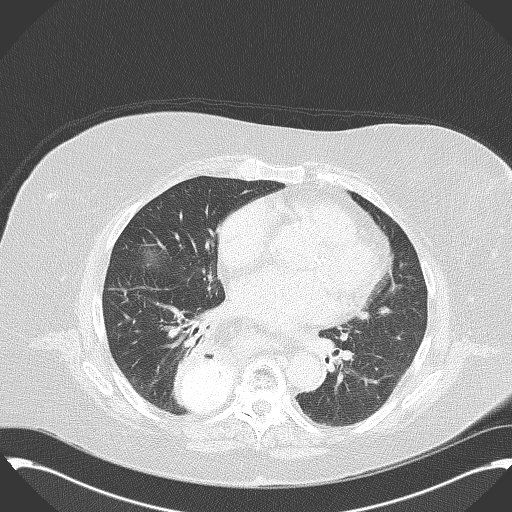

[14 of 31 positions shown; findings below may reference images not displayed]

FINDINGS: CT CHEST

Mediastinum/Nodes: Normal heart size. No significant pericardial
fluid/thickening. Left anterior descending, left circumflex and
right coronary atherosclerosis. Atherosclerotic nonaneurysmal
thoracic aorta. Normal caliber pulmonary arteries. No central
pulmonary emboli. No discrete thyroid nodules. Unremarkable
esophagus. No pathologically enlarged axillary, mediastinal or hilar
lymph nodes.

Lungs/Pleura: No pneumothorax. No pleural effusion. No acute
consolidative airspace disease, significant pulmonary nodules or
lung masses. There is a mosaic attenuation in both lungs. Scattered
parenchymal banding in the medial right upper lobe and left lower
lobe is consistent with mild postinfectious/ postinflammatory
scarring. There is compressive atelectasis in the medial lower lobes
bilaterally from the hiatal hernia.

Musculoskeletal: No aggressive appearing focal osseous lesions.
Moderate to marked degenerative changes in the thoracic spine.

CT ABDOMEN AND PELVIS

Hepatobiliary: Normal liver with no liver mass. Cholecystectomy. No
biliary ductal dilatation.

Pancreas: Normal, with no mass or duct dilation.

Spleen: Normal size. No mass.

Adrenals/Urinary Tract: Normal adrenals. No hydronephrosis. No renal
mass. Normal bladder.

Stomach/Bowel: Large hiatal hernia. Otherwise grossly normal
stomach. Normal caliber small bowel with no small bowel wall
thickening. Normal appendix . Normal large bowel with no
diverticulosis, large bowel wall thickening or pericolonic fat
stranding.

Vascular/Lymphatic: Atherosclerotic nonaneurysmal abdominal aorta.
Patent portal, splenic, hepatic and renal veins. There are mildly
enlarged 1.1 cm and 1.0 cm left external iliac nodes (series 2/
image 92 and series 2/image 98). Mildly enlarged 1.0 cm left
para-aortic node (series 2/image 60). No additional pathologically
enlarged abdominopelvic lymph nodes.

Reproductive: The uterus is anteverted. There is abnormal
endometrial thickening (20 mm). There is a nonspecific 2.0 x 2.1 x
2.3 cm posterior uterine body mass most suggestive of a subserosal
fibroid. There is a fat attenuation 2.7 x 2.2 x 2.1 cm mass adjacent
to the uterine fundus (series 2/ image 87), which could represent a
left ovarian dermoid or an exophytic lipoleiomyoma. No right adnexal
mass.

Other: No pneumoperitoneum, ascites or focal fluid collection. Small
fat containing left inguinal hernia.

Musculoskeletal: No aggressive appearing focal osseous lesions.
Moderate to marked dextroscoliosis of the lumbar spine with marked
degenerative changes in the lumbar spine.
IMPRESSION: 1. Abnormally thickened endometrium (20 mm), consistent with the
provided history of endometrial cancer.
2. Two mildly enlarged left external iliac lymph nodes and single
mildly enlarged left para-aortic node are indeterminate, nodal
metastases not excluded. Otherwise no potential sites of metastatic
disease in the chest, abdomen or pelvis.
3. Posterior uterine body 2.3 cm mass, nonspecific, most suggestive
of a subserosal fibroid.
4. Fatty attenuation 2.7 cm mass adjacent to the uterine fundus,
differential includes a left ovarian dermoid or exophytic
lipoleiomyoma.
5. No ascites.
6. Additional findings include coronary atherosclerosis, aortic
atherosclerosis, mosaic attenuation in the lungs most commonly due
to air trapping, large hiatal hernia and small fat containing left
inguinal hernia.

## 2017-02-25 ENCOUNTER — Other Ambulatory Visit: Payer: Self-pay | Admitting: Hematology and Oncology

## 2017-02-25 DIAGNOSIS — C541 Malignant neoplasm of endometrium: Secondary | ICD-10-CM

## 2017-03-01 ENCOUNTER — Telehealth: Payer: Self-pay | Admitting: Hematology and Oncology

## 2017-03-01 ENCOUNTER — Encounter: Payer: Self-pay | Admitting: Hematology and Oncology

## 2017-03-01 ENCOUNTER — Other Ambulatory Visit (HOSPITAL_BASED_OUTPATIENT_CLINIC_OR_DEPARTMENT_OTHER): Payer: Medicare Other

## 2017-03-01 ENCOUNTER — Other Ambulatory Visit: Payer: Medicare Other

## 2017-03-01 ENCOUNTER — Ambulatory Visit (HOSPITAL_BASED_OUTPATIENT_CLINIC_OR_DEPARTMENT_OTHER): Payer: Medicare Other | Admitting: Hematology and Oncology

## 2017-03-01 ENCOUNTER — Ambulatory Visit: Payer: Medicare Other

## 2017-03-01 DIAGNOSIS — C541 Malignant neoplasm of endometrium: Secondary | ICD-10-CM | POA: Diagnosis present

## 2017-03-01 DIAGNOSIS — T451X5A Adverse effect of antineoplastic and immunosuppressive drugs, initial encounter: Secondary | ICD-10-CM

## 2017-03-01 DIAGNOSIS — C55 Malignant neoplasm of uterus, part unspecified: Secondary | ICD-10-CM | POA: Diagnosis not present

## 2017-03-01 DIAGNOSIS — G620 Drug-induced polyneuropathy: Secondary | ICD-10-CM

## 2017-03-01 LAB — COMPREHENSIVE METABOLIC PANEL
ALBUMIN: 3.7 g/dL (ref 3.5–5.0)
ALK PHOS: 100 U/L (ref 40–150)
ALT: 14 U/L (ref 0–55)
ANION GAP: 8 meq/L (ref 3–11)
AST: 19 U/L (ref 5–34)
BILIRUBIN TOTAL: 0.66 mg/dL (ref 0.20–1.20)
BUN: 22.5 mg/dL (ref 7.0–26.0)
CALCIUM: 10.4 mg/dL (ref 8.4–10.4)
CO2: 27 mEq/L (ref 22–29)
Chloride: 103 mEq/L (ref 98–109)
Creatinine: 0.9 mg/dL (ref 0.6–1.1)
EGFR: 58 mL/min/{1.73_m2} — AB (ref >90)
Glucose: 135 mg/dl (ref 70–140)
POTASSIUM: 4.2 meq/L (ref 3.5–5.1)
Sodium: 138 mEq/L (ref 136–145)
Total Protein: 6.6 g/dL (ref 6.4–8.3)

## 2017-03-01 LAB — CBC WITH DIFFERENTIAL/PLATELET
BASO%: 0.6 % (ref 0.0–2.0)
Basophils Absolute: 0 10*3/uL (ref 0.0–0.1)
EOS%: 3 % (ref 0.0–7.0)
Eosinophils Absolute: 0.2 10*3/uL (ref 0.0–0.5)
HEMATOCRIT: 39 % (ref 34.8–46.6)
HGB: 12.9 g/dL (ref 11.6–15.9)
LYMPH#: 1 10*3/uL (ref 0.9–3.3)
LYMPH%: 17.4 % (ref 14.0–49.7)
MCH: 32.2 pg (ref 25.1–34.0)
MCHC: 33 g/dL (ref 31.5–36.0)
MCV: 97.5 fL (ref 79.5–101.0)
MONO#: 0.6 10*3/uL (ref 0.1–0.9)
MONO%: 11.6 % (ref 0.0–14.0)
NEUT%: 67.4 % (ref 38.4–76.8)
NEUTROS ABS: 3.7 10*3/uL (ref 1.5–6.5)
PLATELETS: 180 10*3/uL (ref 145–400)
RBC: 4 10*6/uL (ref 3.70–5.45)
RDW: 15 % — ABNORMAL HIGH (ref 11.2–14.5)
WBC: 5.5 10*3/uL (ref 3.9–10.3)

## 2017-03-01 MED ORDER — SODIUM CHLORIDE 0.9% FLUSH
10.0000 mL | Freq: Once | INTRAVENOUS | Status: AC
  Administered 2017-03-01: 10 mL
  Filled 2017-03-01: qty 10

## 2017-03-01 MED ORDER — HEPARIN SOD (PORK) LOCK FLUSH 100 UNIT/ML IV SOLN
500.0000 [IU] | Freq: Once | INTRAVENOUS | Status: AC
  Administered 2017-03-01: 500 [IU]
  Filled 2017-03-01: qty 5

## 2017-03-01 NOTE — Telephone Encounter (Signed)
Gave patient AVS and calender per 6/4 los.

## 2017-03-01 NOTE — Patient Instructions (Signed)

## 2017-03-03 NOTE — Assessment & Plan Note (Signed)
Clinically, she has no signs to suggest disease recurrence. I will see her on a yearly basis. I will continue port flushes to maintain port patency

## 2017-03-03 NOTE — Progress Notes (Signed)
[facility] FOLLOW-UP progress notes  Patient Care Team: Maury Dus, MD as PCP - General (Family Medicine)  CHIEF COMPLAINTS/PURPOSE OF VISIT:  Endometrial cancer follow-up  HISTORY OF PRESENTING ILLNESS:  Melinda Kerr 82 y.o. female was transferred to my care after her prior physician has left.  I reviewed the patient's records extensive and collaborated the history with the patient. Summary of her history is as follows:   Malignant neoplasm of uterus ([place])   03/04/2016 Pathology Results    Endometrium, curettage HIGH GRADE SEROUS CARCINOMA      03/04/2016 Surgery    Procedure: Hysteroscopy and D&C      03/24/2016 Imaging    CT abdomen: 1. Abnormally thickened endometrium (20 mm), consistent with the provided history of endometrial cancer. 2. Two mildly enlarged left external iliac lymph nodes and single mildly enlarged left para-aortic node are indeterminate, nodal metastases not excluded. Otherwise no potential sites of metastatic disease in the chest, abdomen or pelvis. 3. Posterior uterine body 2.3 cm mass, nonspecific, most suggestive of a subserosal fibroid. 4. Fatty attenuation 2.7 cm mass adjacent to the uterine fundus, differential includes a left ovarian dermoid or exophytic Lipoleiomyoma. 5. No ascites. 6. Additional findings include coronary atherosclerosis, aortic atherosclerosis, mosaic attenuation in the lungs most commonly due to air trapping, large hiatal hernia and small fat containing left inguinal hernia.      03/26/2016 Pathology Results    1. Lymph node, sentinel, biopsy, right obturator - ONE BENIGN LYMPH NODE (0/1). 2. Lymph node, sentinel, biopsy, left external iliac - ONE BENIGN LYMPH NODE (0/1). 3. Lymph node, sentinel, biopsy, left obturator - METASTATIC CARCINOMA IN ONE LYMPH NODE (1/1). 4. Lymph node, sentinel, biopsy, left common iliac - ONE BENIGN LYMPH NODE (0/1). 5. Uterus +/- tubes/ovaries, neoplastic, cervix - HIGH GRADE SEROUS  CARCINOMA EXTENDING TO THE OUTER HALF OF THE MYOMETRIUM. - LYMPHOVASCULAR INVOLVEMENT BY TUMOR. - MARGINS NOT INVOLVED. - CERVIX, BILATERAL OVARIES AND BILATERAL FALLOPIAN TUBES FREE OF TUMOR. - MATURE CYSTIC TERATOMA OF LEFT OVARY. Microscopic Comment 5. ONCOLOGY TABLE-UTERUS, CARCINOMA OR CARCINOSARCOMA  Specimen: Uterus with bilateral obturator and iliac lymph nodes Procedure: Hysterectomy with bilateral salpingo-oophorectomy and lymph node sampling Lymph node sampling performed: Yes Specimen integrity: Intact Maximum tumor size: 3.8 cm Histologic type: Serous carcinoma Grade: High grade Myometrial invasion: 1.5 cm where myometrium is 1.8 cm in thickness Cervical stromal involvement: No Extent of involvement of other organs: N/A Lymph - vascular invasion: Present. Peritoneal washings: No submitted Lymph nodes: Examined: 4 Sentinel 0 Non-sentinel 4 Total Lymph nodes with metastasis: 1 Isolated tumor cells (< 0.2 mm): 0 Micrometastasis: (> 0.2 mm and < 2.0 mm): 0 Macrometastasis: (> 2.0 mm): 1 Extracapsular extension: No Pelvic lymph nodes: 1 involved of 4 lymph nodes. Para-aortic lymph nodes: N/A involved of N/A lymph nodes. Other (specify involvement and site): N/A TNM code: pT1b, pN1 FIGO Stage (based on pathologic findings, needs clinical correlation): IIIC1 Comment: Immunohistochemistry for cytokeratin AE1/AE3 is performed on parts 1, 2 and 4 and no positivity is identified. (JDP:kh 03-30-16)      03/26/2016 Surgery    Operation: Robotic-assisted laparoscopic total hysterectomy with bilateral salpingoophorectomy, sentinel lymph node biopsy  Operative Findings:  : 6cm uterus, grossly normal tubes and ovaries with mild adhesions to pelvis. Suspicious, bulky firm left obturator lymph node. Bilateral mapping. No evidence for intraperitoneal metastases.       05/04/2016 - 08/10/2016 Chemotherapy    She received chemo with carboplatin and Taxol x 5 cycles only  05/15/2016 Procedure    Placement of single lumen port a cath via right internal jugular vein. The catheter tip lies at the cavoatrial junction. A power injectable port a cath was placed and is ready for immediate use.       09/08/2016 Imaging    Ct abdomen: 1. Status post total abdominal hysterectomy and bilateral salpingo-oophorectomy. No definite findings to suggest local recurrence or metastatic disease in the abdomen or pelvis. 2. Large hiatal hernia. 3. Aortic atherosclerosis, in addition to left main and 3 vessel coronary artery disease. Assessment for potential risk factor modification, dietary therapy or pharmacologic therapy may be warranted, if clinically indicated. 4. Additional incidental findings, as above.      10/08/2016 - 11/03/2016 Radiation Therapy    Vaginal Cuff treated to 30 Gy in 5 fractions      She returns for further follow-up. She is doing well. Denies abdominal bloating, changes in bowel habits, nausea or abdominal pain. No recent vaginal bleeding. She lives alone but seems to be doing well with help She has no major residual side effects from prior chemotherapy.  MEDICAL HISTORY:  Past Medical History:  Diagnosis Date  . Cancer [facility]    endometrial cancer   . Depression   . Diabetes mellitus without complication (HCC)    borderline , diet controlled   . Esophageal reflux   . Hyperlipidemia    under control  . Hypertension   . Hypothyroidism   . Morbid obesity with BMI of 40.0-44.9, adult ([place])   . Nocturia   . OSA on CPAP   . Osteoarthritis   . Phlebitis    hx of  . Pneumonia    hx of   . Prediabetes    under control with diet  . Spondylosis    with scoliosis  . Urinary frequency     SURGICAL HISTORY: Past Surgical History:  Procedure Laterality Date  . CHOLECYSTECTOMY  age 43  . DILATATION & CURETTAGE/HYSTEROSCOPY WITH MYOSURE N/A 03/04/2016   Procedure: DILATATION & CURETTAGE/HYSTEROSCOPY ;  Surgeon: Azucena Fallen, MD;  Location: [facility];   Service: Gynecology;  Laterality: N/A;  polyp removal   . HERNIA REPAIR  age 25   umblical  . IR GENERIC HISTORICAL  05/15/2016   IR US GUIDE VASC ACCESS RIGHT 05/15/2016 Aletta Edouard, MD [facility]  . IR GENERIC HISTORICAL  05/15/2016   IR FLUORO GUIDE CV LINE RIGHT 05/15/2016 Aletta Edouard, MD [facility]  . LYMPH NODE BIOPSY N/A 03/26/2016   Procedure: Sentinel LYMPH NODE BIOPSY;  Surgeon: Everitt Amber, MD;  Location: [facility];  Service: Gynecology;  Laterality: N/A;  . ROBOTIC ASSISTED TOTAL HYSTERECTOMY WITH BILATERAL SALPINGO OOPHERECTOMY N/A 03/26/2016   Procedure: XI ROBOTIC ASSISTED TOTAL Laproscopic HYSTERECTOMY WITH BILATERAL SALPINGO OOPHORECTOMY;  Surgeon: Everitt Amber, MD;  Location: [facility];  Service: Gynecology;  Laterality: N/A;  . TOTAL KNEE ARTHROPLASTY Left 10/09/2013   Procedure: LEFT TOTAL KNEE ARTHROPLASTY;  Surgeon: [name], MD;  Location: [facility];  Service: Orthopedics;  Laterality: Left;  . TOTAL KNEE ARTHROPLASTY Right 04/09/2014   Procedure: RIGHT TOTAL KNEE ARTHROPLASTY;  Surgeon: [name], MD;  Location: [facility];  Service: Orthopedics;  Laterality: Right;  . TUBAL LIGATION  age 36  . VARICOSE VEIN SURGERY Bilateral age 54  . WISDOM TOOTH EXTRACTION  age 50    SOCIAL HISTORY: Social History   Social History  . Marital status: Widowed    Spouse name: N/A  . Number of children: 0  .  Years of education: N/A   Occupational History  . Tax collection retired    Social History Main Topics  . Smoking status: Never Smoker  . Smokeless tobacco: Never Used  . Alcohol use Not on file  . Drug use: Unknown  . Sexual activity: Not Currently   Other Topics Concern  . Not on file   Social History Narrative  . No narrative on file    FAMILY HISTORY: Family History  Problem Relation Age of Onset  . Heart disease Mother   . Heart disease Father   . CVA Father     ALLERGIES:  is allergic to oxybutynin.  MEDICATIONS:  Current Outpatient  Prescriptions  Medication Sig Dispense Refill  . acetaminophen (TYLENOL) 500 MG tablet Take 1,000 mg by mouth 2 (two) times daily.     Marland Kitchen allopurinol (ZYLOPRIM) 300 MG tablet Take 300 mg by mouth daily with breakfast.     . calcium gluconate 500 MG tablet Takes 1 tablet in am and 2 in the evening.    . cetirizine (ZYRTEC) 10 MG tablet Take 10 mg by mouth daily.    . citalopram (CELEXA) 10 MG tablet Take 10 mg by mouth daily.    . Cyanocobalamin (VITAMIN B-12 PO) Take by mouth.    . docusate sodium (COLACE) 100 MG capsule Take 100 mg by mouth daily as needed for mild constipation.    . Ferrous Sulfate 90 (18 Fe) MG TABS Take 1 tablet by mouth daily.    . furosemide (LASIX) 20 MG tablet Take 20 mg by mouth daily.    Marland Kitchen HAWTHORNE BERRY PO Take 500 mg by mouth 2 (two) times daily.    . hydrOXYzine (ATARAX/VISTARIL) 25 MG tablet Take 25 mg by mouth at bedtime.    Marland Kitchen levothyroxine (SYNTHROID, LEVOTHROID) 125 MCG tablet Take 125 mcg by mouth daily before breakfast.    . lidocaine-prilocaine (EMLA) cream Apply 1-2 hours prior to [facility] access as directed 30 g 1  . lisinopril (PRINIVIL,ZESTRIL) 20 MG tablet Take 20 mg by mouth daily with breakfast.     . Multiple Vitamins-Minerals (MULTIVITAMIN WITH MINERALS) tablet Take 1 tablet by mouth daily.    . Omega-3 Fatty Acids (FISH OIL PO) Take 1 tablet by mouth daily.    Marland Kitchen omeprazole (PRILOSEC OTC) 20 MG tablet Take 20 mg by mouth daily.    . polyethylene glycol (MIRALAX / GLYCOLAX) packet Take 17 g by mouth daily as needed for mild constipation or moderate constipation. 14 each 0  . pyridOXINE (VITAMIN B-6) 100 MG tablet Take 100 mg by mouth daily.    Marland Kitchen tolterodine (DETROL LA) 4 MG 24 hr capsule Take 4 mg by mouth daily.    . traMADol (ULTRAM) 50 MG tablet Take 1-2 tablets (50-100 mg total) by mouth every 6 (six) hours as needed (mild pain). 80 tablet 1  . vitamin C (ASCORBIC ACID) 500 MG tablet Take 500 mg by mouth daily.    Marland Kitchen LORazepam (ATIVAN) 0.5 MG  tablet Place 1 tablet under the tongue or swallow every 6 hrs as needed for nausea. Will Make drowsy. (Patient not taking: Reported on 03/01/2017) 20 tablet 0  . ondansetron (ZOFRAN) 8 MG tablet Take 1 tablet (8 mg total) by mouth every 8 (eight) hours as needed for nausea or vomiting. (Patient not taking: Reported on 03/01/2017) 30 tablet 1   No current facility-administered medications for this visit.     REVIEW OF SYSTEMS:   Constitutional: Denies fevers, chills or abnormal  night sweats Eyes: Denies blurriness of vision, double vision or watery eyes Ears, nose, mouth, throat, and face: Denies mucositis or sore throat Respiratory: Denies cough, dyspnea or wheezes Cardiovascular: Denies palpitation, chest discomfort or lower extremity swelling Gastrointestinal:  Denies nausea, heartburn or change in bowel habits Skin: Denies abnormal skin rashes Lymphatics: Denies new lymphadenopathy or easy bruising Neurological:Denies numbness, tingling or new weaknesses Behavioral/Psych: Mood is stable, no new changes  All other systems were reviewed with the patient and are negative.  PHYSICAL EXAMINATION: ECOG PERFORMANCE STATUS: 1 - Symptomatic but completely ambulatory  Vitals:   03/01/17 1403  BP: 110/60  Pulse: 83  Resp: 18  Temp: 98.4 F (36.9 C)   Filed Weights   03/01/17 1403  Weight: 248 lb 8 oz (112.7 kg)    GENERAL:alert, no distress and comfortable.  She is morbidly obese SKIN: skin color, texture, turgor are normal, no rashes or significant lesions EYES: normal, conjunctiva are pink and non-injected, sclera clear OROPHARYNX:no exudate, normal lips, buccal mucosa, and tongue  NECK: supple, thyroid normal size, non-tender, without nodularity LYMPH:  no palpable lymphadenopathy in the cervical, axillary or inguinal LUNGS: clear to auscultation and percussion with normal breathing effort HEART: regular rate & rhythm and no murmurs without lower extremity edema ABDOMEN:abdomen  soft, non-tender and normal bowel sounds Musculoskeletal:no cyanosis of digits and no clubbing  PSYCH: alert & oriented x 3 with fluent speech NEURO: no focal motor/sensory deficits  LABORATORY DATA:  I have reviewed the data as listed Lab Results  Component Value Date   WBC 5.5 03/01/2017   HGB 12.9 03/01/2017   HCT 39.0 03/01/2017   MCV 97.5 03/01/2017   PLT 180 03/01/2017    Recent Labs  03/03/16 1635  03/27/16 0426  05/15/16 1020  09/10/16 1050 10/22/16 1326 03/01/17 1325  NA 136  < > 138  < > 136  < > 138 140 138  K 4.9  < > 5.2*  < > 4.7  < > 4.2 4.4 4.2  CL 102  --  106  --  101  --   --   --   --   CO2 27  < > 28  < > 29  < > 25 26 27   GLUCOSE 118*  < > 142*  < > 119*  < > 148* 142* 135  BUN 29*  < > 14  < > 28*  < > 17.4 21.4 22.5  CREATININE 1.04*  < > 0.94  < > 0.98  < > 0.9 0.8 0.9  CALCIUM 9.8  < > 9.0  < > 9.3  < > 9.5 9.9 10.4  GFRNONAA 50*  --  56*  --  53*  --   --   --   --   GFRAA 58*  --  >60  --  >60  --   --   --   --   PROT 6.6  --   --   < >  --   < > 6.3* 6.1* 6.6  ALBUMIN 3.9  --   --   < >  --   < > 3.1* 3.4* 3.7  AST 24  --   --   < >  --   < > 17 18 19   ALT 16  --   --   < >  --   < > 14 13 14   ALKPHOS 95  --   --   < >  --   < >  113 90 100  BILITOT 0.5  --   --   < >  --   < > 0.36 0.46 0.66  < > = values in this interval not displayed.  ASSESSMENT & PLAN:  Malignant neoplasm of uterus ([place]) Clinically, she has no signs to suggest disease recurrence. I will see her on a yearly basis. I will continue port flushes to maintain port patency  Chemotherapy-induced peripheral neuropathy (HCC) She has minimum residual peripheral neuropathy from prior treatment. Continue close observation only for now   No orders of the defined types were placed in this encounter.   All questions were answered. The patient knows to call the clinic with any problems, questions or concerns. I spent 20 minutes counseling the patient face to face. The total  time spent in the appointment was 35 minutes and more than 50% was on counseling.     Heath Lark, MD 03/03/2017 7:05 AM

## 2017-03-03 NOTE — Assessment & Plan Note (Signed)
She has minimum residual peripheral neuropathy from prior treatment. Continue close observation only for now

## 2017-03-12 DIAGNOSIS — H2513 Age-related nuclear cataract, bilateral: Secondary | ICD-10-CM | POA: Diagnosis not present

## 2017-03-22 DIAGNOSIS — E1122 Type 2 diabetes mellitus with diabetic chronic kidney disease: Secondary | ICD-10-CM | POA: Diagnosis not present

## 2017-03-22 DIAGNOSIS — M545 Low back pain: Secondary | ICD-10-CM | POA: Diagnosis not present

## 2017-03-22 DIAGNOSIS — M109 Gout, unspecified: Secondary | ICD-10-CM | POA: Diagnosis not present

## 2017-03-22 DIAGNOSIS — I129 Hypertensive chronic kidney disease with stage 1 through stage 4 chronic kidney disease, or unspecified chronic kidney disease: Secondary | ICD-10-CM | POA: Diagnosis not present

## 2017-03-22 DIAGNOSIS — E039 Hypothyroidism, unspecified: Secondary | ICD-10-CM | POA: Diagnosis not present

## 2017-03-22 DIAGNOSIS — N183 Chronic kidney disease, stage 3 (moderate): Secondary | ICD-10-CM | POA: Diagnosis not present

## 2017-03-22 DIAGNOSIS — N3281 Overactive bladder: Secondary | ICD-10-CM | POA: Diagnosis not present

## 2017-03-22 DIAGNOSIS — G4700 Insomnia, unspecified: Secondary | ICD-10-CM | POA: Diagnosis not present

## 2017-03-22 DIAGNOSIS — R609 Edema, unspecified: Secondary | ICD-10-CM | POA: Diagnosis not present

## 2017-03-22 DIAGNOSIS — E7800 Pure hypercholesterolemia, unspecified: Secondary | ICD-10-CM | POA: Diagnosis not present

## 2017-03-22 DIAGNOSIS — N390 Urinary tract infection, site not specified: Secondary | ICD-10-CM | POA: Diagnosis not present

## 2017-03-22 DIAGNOSIS — F3341 Major depressive disorder, recurrent, in partial remission: Secondary | ICD-10-CM | POA: Diagnosis not present

## 2017-03-22 DIAGNOSIS — K219 Gastro-esophageal reflux disease without esophagitis: Secondary | ICD-10-CM | POA: Diagnosis not present

## 2017-04-13 DIAGNOSIS — N183 Chronic kidney disease, stage 3 (moderate): Secondary | ICD-10-CM | POA: Diagnosis not present

## 2017-04-15 ENCOUNTER — Ambulatory Visit: Payer: Self-pay | Admitting: Radiation Oncology

## 2017-04-22 ENCOUNTER — Ambulatory Visit (HOSPITAL_BASED_OUTPATIENT_CLINIC_OR_DEPARTMENT_OTHER): Payer: Medicare Other

## 2017-04-22 ENCOUNTER — Ambulatory Visit
Admission: RE | Admit: 2017-04-22 | Discharge: 2017-04-22 | Disposition: A | Discharge status: Home / Self Care | Payer: Medicare Other | Source: Ambulatory Visit | Attending: Radiation Oncology | Admitting: Radiation Oncology

## 2017-04-22 ENCOUNTER — Encounter: Payer: Self-pay | Admitting: Radiation Oncology

## 2017-04-22 VITALS — BP 118/84 | HR 87 | Temp 98.2°F | Ht 62.0 in | Wt 246.0 lb

## 2017-04-22 DIAGNOSIS — Z79899 Other long term (current) drug therapy: Secondary | ICD-10-CM | POA: Diagnosis not present

## 2017-04-22 DIAGNOSIS — Z452 Encounter for adjustment and management of vascular access device: Secondary | ICD-10-CM

## 2017-04-22 DIAGNOSIS — C55 Malignant neoplasm of uterus, part unspecified: Secondary | ICD-10-CM | POA: Diagnosis not present

## 2017-04-22 DIAGNOSIS — R350 Frequency of micturition: Secondary | ICD-10-CM | POA: Diagnosis not present

## 2017-04-22 DIAGNOSIS — Z08 Encounter for follow-up examination after completed treatment for malignant neoplasm: Secondary | ICD-10-CM | POA: Diagnosis not present

## 2017-04-22 DIAGNOSIS — C541 Malignant neoplasm of endometrium: Secondary | ICD-10-CM | POA: Insufficient documentation

## 2017-04-22 DIAGNOSIS — Z8542 Personal history of malignant neoplasm of other parts of uterus: Secondary | ICD-10-CM | POA: Diagnosis not present

## 2017-04-22 MED ORDER — HEPARIN SOD (PORK) LOCK FLUSH 100 UNIT/ML IV SOLN
500.0000 [IU] | Freq: Once | INTRAVENOUS | Status: AC
  Administered 2017-04-22: 500 [IU]
  Filled 2017-04-22: qty 5

## 2017-04-22 MED ORDER — SODIUM CHLORIDE 0.9% FLUSH
10.0000 mL | Freq: Once | INTRAVENOUS | Status: AC
  Administered 2017-04-22: 10 mL
  Filled 2017-04-22: qty 10

## 2017-04-22 NOTE — Progress Notes (Signed)
Radiation Oncology         [phone] ________________________________  Name: Melinda Kerr MRN: [id number]  Date: 04/22/2017  DOB: 1935/06/02  Follow-Up Visit Note  CC: Maury Dus, MD  Gordy Levan, MD    ICD-10-CM   1. Malignant neoplasm of endometrium [facility]) C54.1     Diagnosis:  Stage IIIC1 high grade serous endometrial carcinoma  Interval Since Last Radiation: 5 months 10/08/16-11/03/16 30 Gy to the vaginal cuff in 5 fractions  Narrative:  The patient returns today for routine follow-up. She denies having any pain. She reports having a recent UTI in June. She continues to have urinary frequency. She denies having any bowel issues or vaginal bleeding/discharge. She reports feeling short of breath with activity. She is using a vaginal dilator.                              ALLERGIES:  is allergic to oxybutynin.  Meds: Current Outpatient Prescriptions  Medication Sig Dispense Refill  . acetaminophen (TYLENOL) 500 MG tablet Take 1,000 mg by mouth 2 (two) times daily.     Marland Kitchen allopurinol (ZYLOPRIM) 300 MG tablet Take 300 mg by mouth daily with breakfast.     . calcium gluconate 500 MG tablet Takes 1 tablet in am and 2 in the evening.    . cetirizine (ZYRTEC) 10 MG tablet Take 10 mg by mouth daily.    . citalopram (CELEXA) 10 MG tablet Take 10 mg by mouth daily.    . Cyanocobalamin (VITAMIN B-12 PO) Take by mouth.    . docusate sodium (COLACE) 100 MG capsule Take 100 mg by mouth daily as needed for mild constipation.    . Ferrous Sulfate 90 (18 Fe) MG TABS Take 1 tablet by mouth daily.    . furosemide (LASIX) 20 MG tablet Take 20 mg by mouth daily.    Marland Kitchen HAWTHORNE BERRY PO Take 500 mg by mouth 2 (two) times daily.    . hydrOXYzine (ATARAX/VISTARIL) 25 MG tablet Take 25 mg by mouth at bedtime.    Marland Kitchen levothyroxine (SYNTHROID, LEVOTHROID) 125 MCG tablet Take 125 mcg by mouth daily before breakfast.    . lidocaine-prilocaine (EMLA) cream Apply 1-2 hours prior to [facility]  access as directed 30 g 1  . lisinopril (PRINIVIL,ZESTRIL) 20 MG tablet Take 20 mg by mouth daily with breakfast.     . Multiple Vitamins-Minerals (MULTIVITAMIN WITH MINERALS) tablet Take 1 tablet by mouth daily.    . Omega-3 Fatty Acids (FISH OIL PO) Take 1 tablet by mouth daily.    Marland Kitchen omeprazole (PRILOSEC OTC) 20 MG tablet Take 20 mg by mouth daily.    . polyethylene glycol (MIRALAX / GLYCOLAX) packet Take 17 g by mouth daily as needed for mild constipation or moderate constipation. 14 each 0  . pyridOXINE (VITAMIN B-6) 100 MG tablet Take 100 mg by mouth daily.    Marland Kitchen tolterodine (DETROL LA) 4 MG 24 hr capsule Take 4 mg by mouth daily.    . traMADol (ULTRAM) 50 MG tablet Take 1-2 tablets (50-100 mg total) by mouth every 6 (six) hours as needed (mild pain). 80 tablet 1  . vitamin C (ASCORBIC ACID) 500 MG tablet Take 500 mg by mouth daily.    Marland Kitchen LORazepam (ATIVAN) 0.5 MG tablet Place 1 tablet under the tongue or swallow every 6 hrs as needed for nausea. Will Make drowsy. (Patient not taking: Reported on 03/01/2017) 20 tablet 0  .  ondansetron (ZOFRAN) 8 MG tablet Take 1 tablet (8 mg total) by mouth every 8 (eight) hours as needed for nausea or vomiting. (Patient not taking: Reported on 03/01/2017) 30 tablet 1   No current facility-administered medications for this encounter.     Physical Findings: The patient is in no acute distress. Patient is alert and oriented.  height is 5\' 2"  (1.575 m) and weight is 246 lb (111.6 kg). Her oral temperature is 98.2 F (36.8 C). Her blood pressure is 118/84 and her pulse is 87. Her oxygen saturation is 93%. .  No significant changes. Lungs are clear to auscultation bilaterally. Heart has regular rate and rhythm. No palpable cervical, supraclavicular, or axillary adenopathy. Abdomen soft, non-tender, normal bowel sounds.   On pelvic examination the external genitalia were unremarkable. A speculum exam was performed. There are no mucosal lesions noted in the vaginal  vault. Cuff well-healed. On bimanual and rectovaginal examination there were no pelvic masses appreciated.    Lab Findings: Lab Results  Component Value Date   WBC 5.5 03/01/2017   HGB 12.9 03/01/2017   HCT 39.0 03/01/2017   MCV 97.5 03/01/2017   PLT 180 03/01/2017    Radiographic Findings: No results found.  Impression:   No evidence of recurrence on clinical exam.  Plan: She is scheduled to follow up with Dr. Denman George in October 2018. She will return for follow up in our clinic in January 2019.   -----------------------------------  Blair Promise, PhD, MD  This document serves as a record of services personally performed by Gery Pray, MD. It was created on his behalf by Arlyce Harman, a trained medical scribe. The creation of this record is based on the scribe's personal observations and the provider's statements to them. This document has been checked and approved by the attending provider.

## 2017-04-22 NOTE — Progress Notes (Signed)
Melinda Kerr is here for follow up.  She denies having pain.  She reports having a recent UTI in June.  She continues to have urinary frequency.  She denies having any bowel issues or vaginal bleeding/discharge.  She reports feeling short of breath with activity.  She is using a vaginal dilator.  BP 118/84 (BP Location: Right Wrist, Patient Position: Sitting)   Pulse 87   Temp 98.2 F (36.8 C) (Oral)   Ht 5\' 2"  (1.575 m)   Wt 246 lb (111.6 kg)   SpO2 93%   BMI 44.99 kg/m    Wt Readings from Last 3 Encounters:  04/22/17 246 lb (111.6 kg)  03/01/17 248 lb 8 oz (112.7 kg)  01/11/17 244 lb 4.8 oz (110.8 kg)

## 2017-04-22 NOTE — Patient Instructions (Signed)

## 2017-04-26 ENCOUNTER — Telehealth: Payer: Self-pay

## 2017-04-26 NOTE — Telephone Encounter (Signed)
Patient called and left message. She wants to change her flush appts from every 6 weeks to every 8 weeks. Scheduling message sent.

## 2017-04-29 ENCOUNTER — Telehealth: Payer: Self-pay | Admitting: Hematology and Oncology

## 2017-04-29 NOTE — Telephone Encounter (Signed)
lvm to infor pt of r/s flush appt to 9/20 at 2 pm per sch msg

## 2017-05-05 DIAGNOSIS — H25812 Combined forms of age-related cataract, left eye: Secondary | ICD-10-CM | POA: Diagnosis not present

## 2017-05-05 DIAGNOSIS — H2512 Age-related nuclear cataract, left eye: Secondary | ICD-10-CM | POA: Diagnosis not present

## 2017-05-10 ENCOUNTER — Ambulatory Visit (INDEPENDENT_AMBULATORY_CARE_PROVIDER_SITE_OTHER): Payer: Medicare Other | Admitting: Podiatry

## 2017-05-10 ENCOUNTER — Encounter: Payer: Self-pay | Admitting: Podiatry

## 2017-05-10 DIAGNOSIS — B351 Tinea unguium: Secondary | ICD-10-CM

## 2017-05-10 DIAGNOSIS — L84 Corns and callosities: Secondary | ICD-10-CM | POA: Diagnosis not present

## 2017-05-10 DIAGNOSIS — M79676 Pain in unspecified toe(s): Secondary | ICD-10-CM | POA: Diagnosis not present

## 2017-05-10 NOTE — Progress Notes (Signed)
Patient ID: Melinda Kerr, female   DOB: June 29, 1935, 82 y.o.   MRN: [id number]   SUBJECTIVE Patient  presents to office today complaining of elongated, thickened nails. Pain while ambulating in shoes. Patient is unable to trim their own nails. Patient also complains of painful callus lesions noted to the bilateral toes  OBJECTIVE General Patient is awake, alert, and oriented x 3 and in no acute distress. Derm painful hyperkeratotic callus lesions noted to the bilateral great toes. Skin is dry and supple bilateral. Negative open lesions or macerations. Remaining integument unremarkable. Nails are tender, long, thickened and dystrophic with subungual debris, consistent with onychomycosis, 1-5 bilateral. No signs of infection noted. Vasc  DP and PT pedal pulses palpable bilaterally. Temperature gradient within normal limits.  Neuro Epicritic and protective threshold sensation diminished bilaterally.  Musculoskeletal Exam No symptomatic pedal deformities noted bilateral. Muscular strength within normal limits.  ASSESSMENT 1. Onychodystrophic nails 1-5 bilateral with hyperkeratosis of nails.  2. Onychomycosis of nail due to dermatophyte bilateral 3. Pre-ulcerative callus lesions noted on lateral great toes  PLAN OF CARE 1. Patient evaluated today.  2. Instructed to maintain good pedal hygiene and foot care.  3. Mechanical debridement of nails 1-5 bilaterally performed using a nail nipper. Filed with dremel without incident.  4. Excisional debridement of callus lesions to the bilateral great toes was performed using a chisel blade without incident or bleeding 5. Return to clinic in 3 months for routine nail care    Edrick Kins, DPM [facility]  Dr. Edrick Kins, [address]                                        [place], [place] [address]                Office [phone]  Fax [phone]

## 2017-05-10 NOTE — Progress Notes (Signed)
NAIL

## 2017-06-02 DIAGNOSIS — R1084 Generalized abdominal pain: Secondary | ICD-10-CM | POA: Diagnosis not present

## 2017-06-02 DIAGNOSIS — R031 Nonspecific low blood-pressure reading: Secondary | ICD-10-CM | POA: Diagnosis not present

## 2017-06-02 DIAGNOSIS — R197 Diarrhea, unspecified: Secondary | ICD-10-CM | POA: Diagnosis not present

## 2017-06-09 ENCOUNTER — Other Ambulatory Visit: Payer: Self-pay | Admitting: Family Medicine

## 2017-06-09 DIAGNOSIS — Z23 Encounter for immunization: Secondary | ICD-10-CM | POA: Diagnosis not present

## 2017-06-09 DIAGNOSIS — L858 Other specified epidermal thickening: Secondary | ICD-10-CM | POA: Diagnosis not present

## 2017-06-09 DIAGNOSIS — R1084 Generalized abdominal pain: Secondary | ICD-10-CM | POA: Diagnosis not present

## 2017-06-09 DIAGNOSIS — L859 Epidermal thickening, unspecified: Secondary | ICD-10-CM | POA: Diagnosis not present

## 2017-06-09 DIAGNOSIS — L989 Disorder of the skin and subcutaneous tissue, unspecified: Secondary | ICD-10-CM | POA: Diagnosis not present

## 2017-06-17 ENCOUNTER — Ambulatory Visit (HOSPITAL_BASED_OUTPATIENT_CLINIC_OR_DEPARTMENT_OTHER): Payer: Medicare Other

## 2017-06-17 DIAGNOSIS — C55 Malignant neoplasm of uterus, part unspecified: Secondary | ICD-10-CM

## 2017-06-17 DIAGNOSIS — Z452 Encounter for adjustment and management of vascular access device: Secondary | ICD-10-CM

## 2017-06-17 MED ORDER — HEPARIN SOD (PORK) LOCK FLUSH 100 UNIT/ML IV SOLN
500.0000 [IU] | Freq: Once | INTRAVENOUS | Status: AC
  Administered 2017-06-17: 500 [IU]
  Filled 2017-06-17: qty 5

## 2017-06-17 MED ORDER — SODIUM CHLORIDE 0.9% FLUSH
10.0000 mL | Freq: Once | INTRAVENOUS | Status: AC
  Administered 2017-06-17: 10 mL
  Filled 2017-06-17: qty 10

## 2017-07-07 DIAGNOSIS — H25811 Combined forms of age-related cataract, right eye: Secondary | ICD-10-CM | POA: Diagnosis not present

## 2017-07-07 DIAGNOSIS — H2511 Age-related nuclear cataract, right eye: Secondary | ICD-10-CM | POA: Diagnosis not present

## 2017-07-12 DIAGNOSIS — S80862A Insect bite (nonvenomous), left lower leg, initial encounter: Secondary | ICD-10-CM | POA: Diagnosis not present

## 2017-07-12 DIAGNOSIS — R109 Unspecified abdominal pain: Secondary | ICD-10-CM | POA: Diagnosis not present

## 2017-07-12 DIAGNOSIS — I129 Hypertensive chronic kidney disease with stage 1 through stage 4 chronic kidney disease, or unspecified chronic kidney disease: Secondary | ICD-10-CM | POA: Diagnosis not present

## 2017-07-12 DIAGNOSIS — Z23 Encounter for immunization: Secondary | ICD-10-CM | POA: Diagnosis not present

## 2017-07-12 DIAGNOSIS — W57XXXA Bitten or stung by nonvenomous insect and other nonvenomous arthropods, initial encounter: Secondary | ICD-10-CM | POA: Diagnosis not present

## 2017-07-12 DIAGNOSIS — R112 Nausea with vomiting, unspecified: Secondary | ICD-10-CM | POA: Diagnosis not present

## 2017-07-13 ENCOUNTER — Inpatient Hospital Stay (HOSPITAL_COMMUNITY)
Admission: EM | Admit: 2017-07-13 | Discharge: 2017-07-16 | DRG: 392 | Disposition: A | Discharge status: Home Health | Payer: Medicare Other | Attending: Internal Medicine | Admitting: Internal Medicine

## 2017-07-13 ENCOUNTER — Ambulatory Visit
Admission: RE | Admit: 2017-07-13 | Discharge: 2017-07-13 | Disposition: A | Discharge status: Home / Self Care | Payer: Medicare Other | Source: Ambulatory Visit | Attending: Family Medicine | Admitting: Family Medicine

## 2017-07-13 ENCOUNTER — Other Ambulatory Visit: Payer: Self-pay | Admitting: Family Medicine

## 2017-07-13 ENCOUNTER — Encounter (HOSPITAL_COMMUNITY): Payer: Self-pay | Admitting: *Deleted

## 2017-07-13 DIAGNOSIS — K3580 Unspecified acute appendicitis: Secondary | ICD-10-CM | POA: Diagnosis not present

## 2017-07-13 DIAGNOSIS — E785 Hyperlipidemia, unspecified: Secondary | ICD-10-CM | POA: Diagnosis present

## 2017-07-13 DIAGNOSIS — Z823 Family history of stroke: Secondary | ICD-10-CM | POA: Diagnosis not present

## 2017-07-13 DIAGNOSIS — Z90710 Acquired absence of both cervix and uterus: Secondary | ICD-10-CM

## 2017-07-13 DIAGNOSIS — Z9049 Acquired absence of other specified parts of digestive tract: Secondary | ICD-10-CM

## 2017-07-13 DIAGNOSIS — R160 Hepatomegaly, not elsewhere classified: Secondary | ICD-10-CM | POA: Diagnosis not present

## 2017-07-13 DIAGNOSIS — Z8542 Personal history of malignant neoplasm of other parts of uterus: Secondary | ICD-10-CM

## 2017-07-13 DIAGNOSIS — F329 Major depressive disorder, single episode, unspecified: Secondary | ICD-10-CM | POA: Diagnosis present

## 2017-07-13 DIAGNOSIS — R1084 Generalized abdominal pain: Secondary | ICD-10-CM | POA: Diagnosis not present

## 2017-07-13 DIAGNOSIS — C55 Malignant neoplasm of uterus, part unspecified: Secondary | ICD-10-CM | POA: Diagnosis present

## 2017-07-13 DIAGNOSIS — K529 Noninfective gastroenteritis and colitis, unspecified: Secondary | ICD-10-CM | POA: Diagnosis not present

## 2017-07-13 DIAGNOSIS — Z8249 Family history of ischemic heart disease and other diseases of the circulatory system: Secondary | ICD-10-CM | POA: Diagnosis not present

## 2017-07-13 DIAGNOSIS — Z888 Allergy status to other drugs, medicaments and biological substances status: Secondary | ICD-10-CM | POA: Diagnosis not present

## 2017-07-13 DIAGNOSIS — K219 Gastro-esophageal reflux disease without esophagitis: Secondary | ICD-10-CM | POA: Diagnosis present

## 2017-07-13 DIAGNOSIS — I10 Essential (primary) hypertension: Secondary | ICD-10-CM | POA: Diagnosis present

## 2017-07-13 DIAGNOSIS — M47817 Spondylosis without myelopathy or radiculopathy, lumbosacral region: Secondary | ICD-10-CM | POA: Diagnosis present

## 2017-07-13 DIAGNOSIS — E119 Type 2 diabetes mellitus without complications: Secondary | ICD-10-CM | POA: Diagnosis present

## 2017-07-13 DIAGNOSIS — E039 Hypothyroidism, unspecified: Secondary | ICD-10-CM | POA: Diagnosis present

## 2017-07-13 DIAGNOSIS — R1031 Right lower quadrant pain: Secondary | ICD-10-CM | POA: Diagnosis not present

## 2017-07-13 DIAGNOSIS — K5900 Constipation, unspecified: Secondary | ICD-10-CM | POA: Diagnosis present

## 2017-07-13 DIAGNOSIS — Z6841 Body Mass Index (BMI) 40.0 and over, adult: Secondary | ICD-10-CM

## 2017-07-13 DIAGNOSIS — W57XXXA Bitten or stung by nonvenomous insect and other nonvenomous arthropods, initial encounter: Secondary | ICD-10-CM | POA: Diagnosis present

## 2017-07-13 DIAGNOSIS — M419 Scoliosis, unspecified: Secondary | ICD-10-CM | POA: Diagnosis present

## 2017-07-13 DIAGNOSIS — Z9221 Personal history of antineoplastic chemotherapy: Secondary | ICD-10-CM

## 2017-07-13 DIAGNOSIS — M1990 Unspecified osteoarthritis, unspecified site: Secondary | ICD-10-CM | POA: Diagnosis present

## 2017-07-13 DIAGNOSIS — I700 Atherosclerosis of aorta: Secondary | ICD-10-CM | POA: Diagnosis present

## 2017-07-13 DIAGNOSIS — G4733 Obstructive sleep apnea (adult) (pediatric): Secondary | ICD-10-CM

## 2017-07-13 DIAGNOSIS — Z96653 Presence of artificial knee joint, bilateral: Secondary | ICD-10-CM | POA: Diagnosis present

## 2017-07-13 DIAGNOSIS — K5720 Diverticulitis of large intestine with perforation and abscess without bleeding: Secondary | ICD-10-CM | POA: Diagnosis not present

## 2017-07-13 DIAGNOSIS — Z79890 Hormone replacement therapy (postmenopausal): Secondary | ICD-10-CM | POA: Diagnosis not present

## 2017-07-13 DIAGNOSIS — Z9989 Dependence on other enabling machines and devices: Secondary | ICD-10-CM

## 2017-07-13 DIAGNOSIS — R109 Unspecified abdominal pain: Secondary | ICD-10-CM

## 2017-07-13 DIAGNOSIS — S90862A Insect bite (nonvenomous), left foot, initial encounter: Secondary | ICD-10-CM | POA: Diagnosis present

## 2017-07-13 DIAGNOSIS — N739 Female pelvic inflammatory disease, unspecified: Secondary | ICD-10-CM

## 2017-07-13 DIAGNOSIS — C541 Malignant neoplasm of endometrium: Secondary | ICD-10-CM | POA: Diagnosis not present

## 2017-07-13 HISTORY — DX: Low back pain: M54.5

## 2017-07-13 HISTORY — DX: Diverticulitis of large intestine with perforation and abscess without bleeding: K57.20

## 2017-07-13 HISTORY — DX: Type 2 diabetes mellitus without complications: E11.9

## 2017-07-13 HISTORY — DX: Heart failure, unspecified: I50.9

## 2017-07-13 HISTORY — DX: Low back pain, unspecified: M54.50

## 2017-07-13 HISTORY — DX: Other chronic pain: G89.29

## 2017-07-13 HISTORY — DX: Acute embolism and thrombosis of unspecified deep veins of unspecified lower extremity: I82.409

## 2017-07-13 HISTORY — DX: Malignant neoplasm of uterus, part unspecified: C55

## 2017-07-13 HISTORY — DX: Gout, unspecified: M10.9

## 2017-07-13 HISTORY — DX: Unspecified osteoarthritis, unspecified site: M19.90

## 2017-07-13 LAB — URINALYSIS, ROUTINE W REFLEX MICROSCOPIC
BACTERIA UA: NONE SEEN
BILIRUBIN URINE: NEGATIVE
GLUCOSE, UA: NEGATIVE mg/dL
Ketones, ur: NEGATIVE mg/dL
LEUKOCYTES UA: NEGATIVE
NITRITE: NEGATIVE
PH: 6 (ref 5.0–8.0)
Protein, ur: NEGATIVE mg/dL
SPECIFIC GRAVITY, URINE: 1.042 — AB (ref 1.005–1.030)
Squamous Epithelial / LPF: NONE SEEN

## 2017-07-13 LAB — COMPREHENSIVE METABOLIC PANEL
ALBUMIN: 2.8 g/dL — AB (ref 3.5–5.0)
ALT: 16 U/L (ref 14–54)
AST: 24 U/L (ref 15–41)
Alkaline Phosphatase: 148 U/L — ABNORMAL HIGH (ref 38–126)
Anion gap: 7 (ref 5–15)
BILIRUBIN TOTAL: 0.8 mg/dL (ref 0.3–1.2)
BUN: 8 mg/dL (ref 6–20)
CALCIUM: 8.9 mg/dL (ref 8.9–10.3)
CO2: 28 mmol/L (ref 22–32)
CREATININE: 0.76 mg/dL (ref 0.44–1.00)
Chloride: 98 mmol/L — ABNORMAL LOW (ref 101–111)
GFR calc Af Amer: >60 mL/min (ref >60)
GLUCOSE: 111 mg/dL — AB (ref 65–99)
Potassium: 4 mmol/L (ref 3.5–5.1)
Sodium: 133 mmol/L — ABNORMAL LOW (ref 135–145)
TOTAL PROTEIN: 6.3 g/dL — AB (ref 6.5–8.1)

## 2017-07-13 LAB — LIPASE, BLOOD: Lipase: 33 U/L (ref 11–51)

## 2017-07-13 LAB — CBC
HCT: 37.6 % (ref 36.0–46.0)
Hemoglobin: 12.4 g/dL (ref 12.0–15.0)
MCH: 32 pg (ref 26.0–34.0)
MCHC: 33 g/dL (ref 30.0–36.0)
MCV: 96.9 fL (ref 78.0–100.0)
PLATELETS: 361 10*3/uL (ref 150–400)
RBC: 3.88 MIL/uL (ref 3.87–5.11)
RDW: 13.9 % (ref 11.5–15.5)
WBC: 10.5 10*3/uL (ref 4.0–10.5)

## 2017-07-13 MED ORDER — PIPERACILLIN-TAZOBACTAM 3.375 G IVPB
3.3750 g | Freq: Three times a day (TID) | INTRAVENOUS | Status: DC
  Administered 2017-07-14 – 2017-07-16 (×7): 3.375 g via INTRAVENOUS
  Filled 2017-07-13 (×8): qty 50

## 2017-07-13 MED ORDER — OMEPRAZOLE MAGNESIUM 20 MG PO TBEC: 20.0000 mg | DELAYED_RELEASE_TABLET | Freq: Every day | ORAL | Status: DC

## 2017-07-13 MED ORDER — SODIUM CHLORIDE 0.9 % IV SOLN
INTRAVENOUS | Status: DC
  Administered 2017-07-14 – 2017-07-15 (×2): via INTRAVENOUS

## 2017-07-13 MED ORDER — PREDNISOLONE ACETATE 1 % OP SUSP
1.0000 [drp] | Freq: Four times a day (QID) | OPHTHALMIC | Status: DC
  Administered 2017-07-14 – 2017-07-16 (×9): 1 [drp] via OPHTHALMIC
  Filled 2017-07-13: qty 1

## 2017-07-13 MED ORDER — ACETAMINOPHEN 500 MG PO TABS
1000.0000 mg | ORAL_TABLET | Freq: Two times a day (BID) | ORAL | Status: DC
  Administered 2017-07-14: 1000 mg via ORAL
  Filled 2017-07-13 (×2): qty 2

## 2017-07-13 MED ORDER — ONDANSETRON HCL 4 MG PO TABS: 4.0000 mg | ORAL_TABLET | Freq: Four times a day (QID) | ORAL | Status: DC | PRN

## 2017-07-13 MED ORDER — GATIFLOXACIN 0.5 % OP SOLN
1.0000 [drp] | Freq: Four times a day (QID) | OPHTHALMIC | Status: DC
  Administered 2017-07-14 – 2017-07-16 (×9): 1 [drp] via OPHTHALMIC
  Filled 2017-07-13: qty 2.5

## 2017-07-13 MED ORDER — ONDANSETRON HCL 4 MG/2ML IJ SOLN: 4.0000 mg | Freq: Four times a day (QID) | INTRAMUSCULAR | Status: DC | PRN

## 2017-07-13 MED ORDER — PIPERACILLIN-TAZOBACTAM 3.375 G IVPB 30 MIN
3.3750 g | Freq: Once | INTRAVENOUS | Status: AC
  Administered 2017-07-13: 3.375 g via INTRAVENOUS
  Filled 2017-07-13: qty 50

## 2017-07-13 MED ORDER — IOPAMIDOL (ISOVUE-300) INJECTION 61%
125.0000 mL | Freq: Once | INTRAVENOUS | Status: AC | PRN
  Administered 2017-07-13: 125 mL via INTRAVENOUS

## 2017-07-13 MED ORDER — CITALOPRAM HYDROBROMIDE 20 MG PO TABS
20.0000 mg | ORAL_TABLET | Freq: Every day | ORAL | Status: DC
  Administered 2017-07-14: 20 mg via ORAL
  Filled 2017-07-13 (×2): qty 2

## 2017-07-13 MED ORDER — ADULT MULTIVITAMIN W/MINERALS CH
1.0000 | ORAL_TABLET | Freq: Every day | ORAL | Status: DC
  Filled 2017-07-13: qty 1

## 2017-07-13 MED ORDER — ACETAMINOPHEN 650 MG RE SUPP: 650.0000 mg | Freq: Four times a day (QID) | RECTAL | Status: DC | PRN

## 2017-07-13 MED ORDER — ACETAMINOPHEN 325 MG PO TABS
650.0000 mg | ORAL_TABLET | Freq: Four times a day (QID) | ORAL | Status: DC | PRN
  Administered 2017-07-14: 650 mg via ORAL

## 2017-07-13 MED ORDER — KETOROLAC TROMETHAMINE 0.5 % OP SOLN
1.0000 [drp] | Freq: Four times a day (QID) | OPHTHALMIC | Status: DC
  Administered 2017-07-14 – 2017-07-16 (×9): 1 [drp] via OPHTHALMIC
  Filled 2017-07-13: qty 3

## 2017-07-13 MED ORDER — FESOTERODINE FUMARATE ER 8 MG PO TB24
8.0000 mg | ORAL_TABLET | Freq: Every day | ORAL | Status: DC
  Administered 2017-07-14 – 2017-07-16 (×3): 8 mg via ORAL
  Filled 2017-07-13 (×3): qty 1

## 2017-07-13 MED ORDER — MORPHINE SULFATE (PF) 4 MG/ML IV SOLN
2.0000 mg | INTRAVENOUS | Status: DC | PRN
Start: 2017-07-13 — End: 2017-07-16

## 2017-07-13 MED ORDER — LISINOPRIL 20 MG PO TABS
20.0000 mg | ORAL_TABLET | Freq: Every day | ORAL | Status: DC
  Filled 2017-07-13: qty 1

## 2017-07-13 MED ORDER — IOHEXOL 300 MG/ML  SOLN
30.0000 mL | Freq: Once | INTRAMUSCULAR | Status: AC | PRN
  Administered 2017-07-13: 30 mL via ORAL

## 2017-07-13 MED ORDER — ALLOPURINOL 300 MG PO TABS
300.0000 mg | ORAL_TABLET | Freq: Every day | ORAL | Status: DC
  Administered 2017-07-14 – 2017-07-16 (×3): 300 mg via ORAL
  Filled 2017-07-13 (×3): qty 1

## 2017-07-13 MED ORDER — HYDROXYZINE HCL 25 MG PO TABS
25.0000 mg | ORAL_TABLET | Freq: Every day | ORAL | Status: DC
  Administered 2017-07-14 – 2017-07-15 (×2): 25 mg via ORAL
  Filled 2017-07-13 (×2): qty 1

## 2017-07-13 MED ORDER — LEVOTHYROXINE SODIUM 125 MCG PO TABS
125.0000 ug | ORAL_TABLET | Freq: Every day | ORAL | Status: DC
  Administered 2017-07-14 – 2017-07-16 (×3): 125 ug via ORAL
  Filled 2017-07-13 (×3): qty 1

## 2017-07-13 MED ORDER — ENOXAPARIN SODIUM 40 MG/0.4ML ~~LOC~~ SOLN
40.0000 mg | SUBCUTANEOUS | Status: DC
  Administered 2017-07-14 – 2017-07-16 (×3): 40 mg via SUBCUTANEOUS
  Filled 2017-07-13 (×4): qty 0.4

## 2017-07-13 MED ORDER — POLYETHYLENE GLYCOL 3350 17 G PO PACK: 17.0000 g | PACK | Freq: Every day | ORAL | Status: DC | PRN

## 2017-07-13 NOTE — Progress Notes (Signed)
Pharmacy Antibiotic Note  Kaelene L Vanwinkle is a 82 y.o. female admitted on 07/13/2017 with intraabdominal infection.  Pharmacy has been consulted for zosyn dosing. WBC 10.5, Afebrile, Scr 0.76, CT abdomen suspicious for abcess.   Plan: Zosyn 3.375g IV X1 (30 minute infusion), then zosyn 3.375g IV every 8 hours (4 hour infusion) Monitor for Scr, c/s, clinical resolution. F/u de-escalation plan/LOT.     Temp (24hrs), Avg:98.2 F (36.8 C), Min:98.2 F (36.8 C), Max:98.2 F (36.8 C)   Recent Labs Lab 07/13/17 1856  WBC 10.5  CREATININE 0.76    CrCl cannot be calculated (Unknown ideal weight.).    Allergies  Allergen Reactions  . Oxybutynin     Bleeding gums, felt sick    Antimicrobials this admission: zosyn 10/16 >>    Thank you for allowing pharmacy to be a part of this patient's care.  Jerrye Noble, PharmD Candidate 07/13/2017 10:15 PM

## 2017-07-13 NOTE — ED Triage Notes (Signed)
Pt has been having abdominal pain with bloating, nausea and vomiting.  Most pain in RLQ.  Pt had a outpatient CT and was called that she should come to the ED due to results showing diverticulitis and possible appendicitis.  Pt denies any fever or chills at this time and is not having any pain at this moment.   Results from CT: 2. Small cul-de-sac fluid collection is suspicious for abscess. This is most likely secondary to sigmoid diverticulitis or colitis in the sigmoid or rectum. 3. Second area of probable inflammation about the cecum and origin of the appendix. Although this could represent a second area of colonic diverticulitis, acute appendicitis cannot be excluded, especially given the clinical history.

## 2017-07-13 NOTE — ED Provider Notes (Signed)
[facility] EMERGENCY DEPARTMENT Provider Note   CSN: [id number] Arrival date & time: 07/13/17  1752     History   Chief Complaint Chief Complaint  Patient presents with  . Abdominal Pain    diverticulitis confirmed by CT    HPI Melinda Kerr is a 82 y.o. female.  HPI Patient was sent in for CT scan findings showing likely diverticulitis with abscess and possible appendicitis. Has had right lower quadrant pains with some nausea bloating and vomiting over the last 5 weeks. States she'll feel good for weaker than feel bad for a week. Has been going on and off. Her doctor ordered a CT scan today and found the abnormalities. She's not had fevers. States the pain has gotten more severe she's not been able to sleep. Previous history of uterine cancer. No longer chemotherapy. Past Medical History:  Diagnosis Date  . Cancer [facility])    endometrial cancer   . Depression   . Diabetes mellitus without complication (HCC)    borderline , diet controlled   . Esophageal reflux   . Hyperlipidemia    under control  . Hypertension   . Hypothyroidism   . Morbid obesity with BMI of 40.0-44.9, adult ([place])   . Nocturia   . OSA on CPAP   . Osteoarthritis   . Phlebitis    hx of  . Pneumonia    hx of   . Prediabetes    under control with diet  . Spondylosis    with scoliosis  . Urinary frequency     Patient Active Problem List   Diagnosis Date Noted  . Diverticulitis of large intestine with abscess 07/13/2017  . Colitis 07/13/2017  . Aortic atherosclerosis ([place]) 09/12/2016  . Coronary artery calcification 09/12/2016  . Hiatal hernia 09/12/2016  . Lumbar disc disease 09/12/2016  . Chemotherapy-induced thrombocytopenia 08/04/2016  . Anemia due to antineoplastic chemotherapy 07/01/2016  . Chemotherapy-induced peripheral neuropathy ([place]) 07/01/2016  . Chemotherapy induced neutropenia ([place]) 05/26/2016  . Port catheter in place 05/26/2016  . Encounter for  antineoplastic chemotherapy 05/26/2016  . Urinary frequency 04/24/2016  . Obstructive sleep apnea on CPAP 04/24/2016  . History of DVT (deep vein thrombosis) 04/24/2016  . Endometrial cancer, FIGO stage IIIC ([place]) 04/24/2016  . Malignant neoplasm of endometrium ([place]) 03/26/2016  . Dyspnea on exertion 03/19/2016  . Malignant neoplasm of uterus ([place]) 03/19/2016  . Status post right knee replacement 04/16/2014  . Essential hypertension 04/16/2014  . Postoperative anemia due to acute blood loss 04/12/2014  . Status post total left knee replacement 10/14/2013  . Thyroid activity decreased 10/14/2013  . UI (urinary incontinence) 10/14/2013  . Dyslipidemia 10/14/2013  . Constipation 10/14/2013  . Gout 10/14/2013  . Anemia 10/14/2013  . Allergic rhinitis 10/14/2013  . Insomnia 10/14/2013  . Depression 10/14/2013  . OA (osteoarthritis) of knee 10/09/2013  . OSA on CPAP 08/10/2013  . Morbid obesity with BMI of 40.0-44.9, adult [facility])     Past Surgical History:  Procedure Laterality Date  . CHOLECYSTECTOMY  age 60  . DILATATION & CURETTAGE/HYSTEROSCOPY WITH MYOSURE N/A 03/04/2016   Procedure: DILATATION & CURETTAGE/HYSTEROSCOPY ;  Surgeon: Azucena Fallen, MD;  Location: [facility];  Service: Gynecology;  Laterality: N/A;  polyp removal   . HERNIA REPAIR  age 68   umblical  . IR GENERIC HISTORICAL  05/15/2016   IR US GUIDE VASC ACCESS RIGHT 05/15/2016 Aletta Edouard, MD [facility]  . IR GENERIC HISTORICAL  05/15/2016   IR FLUORO  GUIDE CV LINE RIGHT 05/15/2016 Aletta Edouard, MD [facility]  . LYMPH NODE BIOPSY N/A 03/26/2016   Procedure: [place] LYMPH NODE BIOPSY;  Surgeon: Everitt Amber, MD;  Location: [facility];  Service: Gynecology;  Laterality: N/A;  . ROBOTIC ASSISTED TOTAL HYSTERECTOMY WITH BILATERAL SALPINGO OOPHERECTOMY N/A 03/26/2016   Procedure: XI ROBOTIC ASSISTED TOTAL Laproscopic HYSTERECTOMY WITH BILATERAL SALPINGO OOPHORECTOMY;  Surgeon: Everitt Amber, MD;  Location: [facility];  Service:  Gynecology;  Laterality: N/A;  . TOTAL KNEE ARTHROPLASTY Left 10/09/2013   Procedure: LEFT TOTAL KNEE ARTHROPLASTY;  Surgeon: [name], MD;  Location: [facility];  Service: Orthopedics;  Laterality: Left;  . TOTAL KNEE ARTHROPLASTY Right 04/09/2014   Procedure: RIGHT TOTAL KNEE ARTHROPLASTY;  Surgeon: [name], MD;  Location: [facility];  Service: Orthopedics;  Laterality: Right;  . TUBAL LIGATION  age 64  . VARICOSE VEIN SURGERY Bilateral age 88  . WISDOM TOOTH EXTRACTION  age 38    OB History    No data available       Home Medications    Prior to Admission medications   Medication Sig Start Date End Date Taking? Authorizing Provider  acetaminophen (TYLENOL) 500 MG tablet Take 1,000 mg by mouth 2 (two) times daily.    Yes [provider]  allopurinol (ZYLOPRIM) 300 MG tablet Take 300 mg by mouth daily with breakfast.    Yes [provider]  calcium gluconate 500 MG tablet Takes 1 tablet in am and 2 in the evening.   Yes [provider]  cetirizine (ZYRTEC) 10 MG tablet Take 10 mg by mouth daily.   Yes [provider]  citalopram (CELEXA) 20 MG tablet Take 20 mg by mouth daily.  01/10/15  Yes [provider]  Cyanocobalamin (VITAMIN B-12 PO) Take by mouth.   Yes [provider]  Ferrous Sulfate 90 (18 Fe) MG TABS Take 1 tablet by mouth daily.   Yes [provider]  furosemide (LASIX) 20 MG tablet Take 20 mg by mouth daily. 01/10/15  Yes [provider]  HAWTHORNE BERRY PO Take 500 mg by mouth 2 (two) times daily.   Yes [provider]  hydrOXYzine (ATARAX/VISTARIL) 25 MG tablet Take 25 mg by mouth at bedtime.   Yes [provider]  ketorolac (ACULAR) 0.5 % ophthalmic solution Place 1 drop into the left eye 4 (four) times daily. 06/17/17  Yes [provider]  levothyroxine (SYNTHROID, LEVOTHROID) 125 MCG tablet Take 125 mcg by mouth daily before breakfast.   Yes [provider]    lidocaine-prilocaine (EMLA) cream Apply 1-2 hours prior to Porta-Cath access as directed 05/12/16  Yes Livesay, Lennis P, MD  lisinopril (PRINIVIL,ZESTRIL) 20 MG tablet Take 20 mg by mouth daily with breakfast.    Yes [provider]  moxifloxacin (VIGAMOX) 0.5 % ophthalmic solution Place 1 drop into the left eye 4 (four) times daily. 06/17/17  Yes [provider]  Multiple Vitamins-Minerals (MULTIVITAMIN WITH MINERALS) tablet Take 1 tablet by mouth daily.   Yes [provider]  mupirocin ointment (BACTROBAN) 2 % Apply 1 application topically 2 (two) times daily as needed. 07/12/17  Yes [provider]  Omega-3 Fatty Acids (FISH OIL PO) Take 1 tablet by mouth daily.   Yes [provider]  omeprazole (PRILOSEC OTC) 20 MG tablet Take 20 mg by mouth daily.   Yes [provider]  polyethylene glycol (MIRALAX / GLYCOLAX) packet Take 17 g by mouth daily as needed for mild  constipation or moderate constipation. 04/12/14  Yes Perkins, Alexzandrew L, PA-C  prednisoLONE acetate (PRED FORTE) 1 % ophthalmic suspension 1 drop 4 (four) times daily.   Yes [provider]  pyridOXINE (VITAMIN B-6) 100 MG tablet Take 100 mg by mouth daily.   Yes [provider]  tolterodine (DETROL LA) 4 MG 24 hr capsule Take 4 mg by mouth daily.   Yes [provider]  traMADol (ULTRAM) 50 MG tablet Take 1-2 tablets (50-100 mg total) by mouth every 6 (six) hours as needed (mild pain). 04/12/14  Yes Perkins, Alexzandrew L, PA-C  vitamin C (ASCORBIC ACID) 500 MG tablet Take 500 mg by mouth daily.   Yes [provider]    Family History Family History  Problem Relation Age of Onset  . Heart disease Mother   . Heart disease Father   . CVA Father     Social History Social History  Substance Use Topics  . Smoking status: Never Smoker  . Smokeless tobacco: Never Used  . Alcohol use Not on file     Allergies   Oxybutynin   Review of  Systems Review of Systems  Constitutional: Positive for appetite change. Negative for fever.  HENT: Negative for congestion.   Gastrointestinal: Positive for abdominal pain, nausea and vomiting.  Genitourinary: Negative for dysuria.  Musculoskeletal: Negative for back pain.  Skin: Negative for wound.  Neurological: Negative for syncope.  Hematological: Negative for adenopathy.  Psychiatric/Behavioral: Negative for behavioral problems.     Physical Exam Updated Vital Signs BP 105/68   Pulse 67   Temp 98.2 F (36.8 C) (Oral)   Resp 18   SpO2 95%   Physical Exam  Constitutional: She appears well-developed.  Patient is obese  HENT:  Head: Atraumatic.  Eyes: Pupils are equal, round, and reactive to light.  Neck: Neck supple.  Cardiovascular: Normal rate.   Pulmonary/Chest: Effort normal.  Abdominal: There is tenderness.  Right lower quadrant tenderness without rebound or guarding.  Musculoskeletal: She exhibits no edema.  Neurological: She is alert.  Skin: Skin is warm. Capillary refill takes less than 2 seconds.     ED Treatments / Results  Labs (all labs ordered are listed, but only abnormal results are displayed) Labs Reviewed  COMPREHENSIVE METABOLIC PANEL - Abnormal; Notable for the following:       Result Value   Sodium 133 (*)    Chloride 98 (*)    Glucose, Bld 111 (*)    Total Protein 6.3 (*)    Albumin 2.8 (*)    Alkaline Phosphatase 148 (*)    All other components within normal limits  URINALYSIS, ROUTINE W REFLEX MICROSCOPIC - Abnormal; Notable for the following:    Color, Urine STRAW (*)    Specific Gravity, Urine 1.042 (*)    Hgb urine dipstick SMALL (*)    All other components within normal limits  LIPASE, BLOOD  CBC    EKG  EKG Interpretation None       Radiology Ct Abdomen Pelvis W Contrast  Result Date: 07/13/2017 CLINICAL DATA:  Right lower quadrant pain with bloating, nausea, vomiting, and diarrhea intermittently for 5 weeks.  Uterine cancer with hysterectomy 6/17. EXAM: CT ABDOMEN AND PELVIS WITH CONTRAST TECHNIQUE: Multidetector CT imaging of the abdomen and pelvis was performed using the standard protocol following bolus administration of intravenous contrast. CONTRAST:  68mL OMNIPAQUE IOHEXOL 300 MG/ML SOLN, 182mL ISOVUE-300 IOPAMIDOL (ISOVUE-300) INJECTION 61% COMPARISON:  09/08/2016 FINDINGS: Lower chest: Bibasilar atelectasis. Mild motion degradation  throughout. Exam is also degraded by patient body habitus. Quality of exam is significantly decreased relative to the prior. Mild cardiomegaly with multivessel coronary artery atherosclerosis. Incompletely imaged central line. A moderate to large hiatal hernia. Hepatobiliary: Hepatomegaly at 21 cm craniocaudal. No focal liver lesion. Cholecystectomy, without biliary ductal dilatation. Pancreas: Grossly normal pancreas, without duct dilatation or dominant mass. Spleen: Normal in size, without focal abnormality. Adrenals/Urinary Tract: Normal adrenal glands. Normal kidneys, without hydronephrosis. Grossly normal urinary bladder. Stomach/Bowel: Normal distal most stomach. Peripherally enhancing fluid collection within the central pelvis measures on the order of 3.0 x 2.2 cm, including on image 68/ series 2. This is intimately associated with the adjacent sigmoid on image 66/series 2 but may arise from the rectum on image 68/series 2. This is new. Extensive colonic diverticulosis. grossly normal terminal ileum. There is soft tissue fullness which may represent inflammation about the inferior aspect of the ileocecal junction and origin of the appendix. Example image 58/series 2. The appendix is felt to be identified on image 60/series 2. Is relatively short, but dilated at 12 mm, including on coronal image 30. Suggestion of mucosal hyperenhancement. Normal caliber of small bowel loops. Vascular/Lymphatic: Aortic and branch vessel atherosclerosis. No abdominopelvic adenopathy. Reproductive:  Hysterectomy.  No adnexal mass. Other: No free intraperitoneal air. Musculoskeletal: Advanced lumbosacral spondylosis. Moderate S-shaped thoracolumbar spine curvature. IMPRESSION: 1. Moderate degradation secondary to motion and patient body habitus. 2. Small cul-de-sac fluid collection is suspicious for abscess. This is most likely secondary to sigmoid diverticulitis or colitis in the sigmoid or rectum. 3. Second area of probable inflammation about the cecum and origin of the appendix. Although this could represent a second area of colonic diverticulitis, acute appendicitis cannot be excluded, especially given the clinical history. 4. Large hiatal hernia. 5. Coronary artery atherosclerosis. Aortic Atherosclerosis (ICD10-I70.0). 6. Hysterectomy without gross metastatic disease. 7. Hepatomegaly. These results will be called to the ordering clinician or representative by the Radiology Department at the imaging location. Electronically Signed   By: Abigail Miyamoto M.D.   On: 07/13/2017 16:49    Procedures Procedures (including critical care time)  Medications Ordered in ED Medications  piperacillin-tazobactam (ZOSYN) IVPB 3.375 g (not administered)  acetaminophen (TYLENOL) tablet 1,000 mg (not administered)  allopurinol (ZYLOPRIM) tablet 300 mg (not administered)  ketorolac (ACULAR) 0.5 % ophthalmic solution 1 drop (not administered)  omeprazole (PRILOSEC OTC) EC tablet 20 mg (not administered)  polyethylene glycol (MIRALAX / GLYCOLAX) packet 17 g (not administered)  fesoterodine (TOVIAZ) tablet 8 mg (not administered)  citalopram (CELEXA) tablet 20 mg (not administered)  hydrOXYzine (ATARAX/VISTARIL) tablet 25 mg (not administered)  levothyroxine (SYNTHROID, LEVOTHROID) tablet 125 mcg (not administered)  lisinopril (PRINIVIL,ZESTRIL) tablet 20 mg (not administered)  gatifloxacin (ZYMAXID) 0.5 % ophthalmic drops 1 drop (not administered)  multivitamin with minerals tablet 1 tablet (not administered)    prednisoLONE acetate (PRED FORTE) 1 % ophthalmic suspension 1 drop (not administered)  morphine 4 MG/ML injection 2-4 mg (not administered)  piperacillin-tazobactam (ZOSYN) IVPB 3.375 g (not administered)     Initial Impression / Assessment and Plan / ED Course  I have reviewed the triage vital signs and the nursing notes.  Pertinent labs & imaging results that were available during my care of the patient were reviewed by me and considered in my medical decision making (see chart for details).     Patient with abdominal pain over the last 5 weeks. Has possible diverticulitis with abscess and possible appendicitis. Seen by general surgery internal medicine. Will treat  with antibiotics and admit. Potential IR drainage. Admit to hospitalist.  Final Clinical Impressions(s) / ED Diagnoses   Final diagnoses:  Diverticulitis of large intestine with abscess without bleeding  Acute appendicitis, unspecified acute appendicitis type    New Prescriptions New Prescriptions   No medications on file     Davonna Belling, MD 07/13/17 2224

## 2017-07-13 NOTE — H&P (Signed)
History and Physical    Melinda Kerr TFT:[id number] DOB: December 09, 1934 DOA: 07/13/2017  PCP: Maury Dus, MD  Patient coming from: Home  I have personally briefly reviewed patient's old medical records in [place]  Chief Complaint: Abd pain  HPI: Melinda Kerr is a 82 y.o. female with medical history significant of uterine cancer.  Patient presents to the ED with c/o 5 week history of abd pain.  Initially had N/V/D.  Pain mainly in R side of abd.  This lasted about a week then resolved.  Thereafter pain was on and off and she was able to eat during this time period.  Due to ongoing pain, went and saw PCP who performed CT scan on her.  Got sent in to ED after CT scan findings appeared to show diverticulitis, abscess, and questionable appendicitis.   ED Course: wbc 10.5, no SIRS.  Started on zosyn.   Review of Systems: As per HPI otherwise 10 point review of systems negative.   Past Medical History:  Diagnosis Date  . Cancer [facility])    endometrial cancer   . Depression   . Diabetes mellitus without complication (HCC)    borderline , diet controlled   . Esophageal reflux   . Hyperlipidemia    under control  . Hypertension   . Hypothyroidism   . Morbid obesity with BMI of 40.0-44.9, adult ([place])   . Nocturia   . OSA on CPAP   . Osteoarthritis   . Phlebitis    hx of  . Pneumonia    hx of   . Prediabetes    under control with diet  . Spondylosis    with scoliosis  . Urinary frequency     Past Surgical History:  Procedure Laterality Date  . CHOLECYSTECTOMY  age 69  . DILATATION & CURETTAGE/HYSTEROSCOPY WITH MYOSURE N/A 03/04/2016   Procedure: DILATATION & CURETTAGE/HYSTEROSCOPY ;  Surgeon: Azucena Fallen, MD;  Location: [facility];  Service: Gynecology;  Laterality: N/A;  polyp removal   . HERNIA REPAIR  age 77   umblical  . IR GENERIC HISTORICAL  05/15/2016   IR US GUIDE VASC ACCESS RIGHT 05/15/2016 Aletta Edouard, MD [facility]  . IR GENERIC HISTORICAL   05/15/2016   IR FLUORO GUIDE CV LINE RIGHT 05/15/2016 Aletta Edouard, MD [facility]  . LYMPH NODE BIOPSY N/A 03/26/2016   Procedure: [place]inel LYMPH NODE BIOPSY;  Surgeon: Everitt Amber, MD;  Location: [facility];  Service: Gynecology;  Laterality: N/A;  . ROBOTIC ASSISTED TOTAL HYSTERECTOMY WITH BILATERAL SALPINGO OOPHERECTOMY N/A 03/26/2016   Procedure: XI ROBOTIC ASSISTED TOTAL Laproscopic HYSTERECTOMY WITH BILATERAL SALPINGO OOPHORECTOMY;  Surgeon: Everitt Amber, MD;  Location: [facility];  Service: Gynecology;  Laterality: N/A;  . TOTAL KNEE ARTHROPLASTY Left 10/09/2013   Procedure: LEFT TOTAL KNEE ARTHROPLASTY;  Surgeon: [name], MD;  Location: [facility];  Service: Orthopedics;  Laterality: Left;  . TOTAL KNEE ARTHROPLASTY Right 04/09/2014   Procedure: RIGHT TOTAL KNEE ARTHROPLASTY;  Surgeon: [name], MD;  Location: [facility];  Service: Orthopedics;  Laterality: Right;  . TUBAL LIGATION  age 52  . VARICOSE VEIN SURGERY Bilateral age 60  . WISDOM TOOTH EXTRACTION  age 26     reports that she has never smoked. She has never used smokeless tobacco. Her alcohol and drug histories are not on file.  Allergies  Allergen Reactions  . Oxybutynin     Bleeding gums, felt sick    Family History  Problem Relation Age  of Onset  . Heart disease Mother   . Heart disease Father   . CVA Father      Prior to Admission medications   Medication Sig Start Date End Date Taking? Authorizing Provider  acetaminophen (TYLENOL) 500 MG tablet Take 1,000 mg by mouth 2 (two) times daily.    Yes [provider]  allopurinol (ZYLOPRIM) 300 MG tablet Take 300 mg by mouth daily with breakfast.    Yes [provider]  calcium gluconate 500 MG tablet Takes 1 tablet in am and 2 in the evening.   Yes [provider]  cetirizine (ZYRTEC) 10 MG tablet Take 10 mg by mouth daily.   Yes [provider]  citalopram (CELEXA) 20 MG tablet Take 20 mg by mouth daily.  01/10/15  Yes [provider]  Cyanocobalamin (VITAMIN B-12 PO) Take by mouth.   Yes [provider]  Ferrous Sulfate 90 (18 Fe) MG TABS Take 1 tablet by mouth daily.   Yes [provider]  furosemide (LASIX) 20 MG tablet Take 20 mg by mouth daily. 01/10/15  Yes [provider]  HAWTHORNE BERRY PO Take 500 mg by mouth 2 (two) times daily.   Yes [provider]  hydrOXYzine (ATARAX/VISTARIL) 25 MG tablet Take 25 mg by mouth at bedtime.   Yes [provider]  ketorolac (ACULAR) 0.5 % ophthalmic solution Place 1 drop into the left eye 4 (four) times daily. 06/17/17  Yes [provider]  levothyroxine (SYNTHROID, LEVOTHROID) 125 MCG tablet Take 125 mcg by mouth daily before breakfast.   Yes [provider]  lidocaine-prilocaine (EMLA) cream Apply 1-2 hours prior to Porta-Cath access as directed 05/12/16  Yes Livesay, Lennis P, MD  lisinopril (PRINIVIL,ZESTRIL) 20 MG tablet Take 20 mg by mouth daily with breakfast.    Yes [provider]  moxifloxacin (VIGAMOX) 0.5 % ophthalmic solution Place 1 drop into the left eye 4 (four) times daily. 06/17/17  Yes [provider]  Multiple Vitamins-Minerals (MULTIVITAMIN WITH MINERALS) tablet Take 1 tablet by mouth daily.   Yes [provider]  mupirocin ointment (BACTROBAN) 2 % Apply 1 application topically 2 (two) times daily as needed. 07/12/17  Yes [provider]  Omega-3 Fatty Acids (FISH OIL PO) Take 1 tablet by mouth daily.   Yes [provider]  omeprazole (PRILOSEC OTC) 20 MG tablet Take 20 mg by mouth daily.   Yes [provider]  polyethylene glycol (MIRALAX / GLYCOLAX) packet Take 17 g by mouth daily as needed for mild constipation or moderate constipation. 04/12/14  Yes Perkins, Alexzandrew L, PA-C  prednisoLONE acetate (PRED FORTE) 1 % ophthalmic suspension 1 drop 4 (four) times daily.   Yes [provider]  pyridOXINE (VITAMIN B-6) 100 MG tablet  Take 100 mg by mouth daily.   Yes [provider]  tolterodine (DETROL LA) 4 MG 24 hr capsule Take 4 mg by mouth daily.   Yes [provider]  traMADol (ULTRAM) 50 MG tablet Take 1-2 tablets (50-100 mg total) by mouth every 6 (six) hours as needed (mild pain). 04/12/14  Yes Perkins, Alexzandrew L, PA-C  vitamin C (ASCORBIC ACID) 500 MG tablet Take 500 mg by mouth daily.   Yes [provider]    Physical Exam: Vitals:   07/13/17 2100 07/13/17 2115 07/13/17 2130 07/13/17 2145  BP: 124/73 118/81 112/65 105/68  Pulse: 66 71 70 67  Resp:    18  Temp:  TempSrc:      SpO2: 95% 98% 96% 95%    Constitutional: NAD, calm, comfortable Eyes: PERRL, lids and conjunctivae normal ENMT: Mucous membranes are moist. Posterior pharynx clear of any exudate or lesions.Normal dentition.  Neck: normal, supple, no masses, no thyromegaly Respiratory: clear to auscultation bilaterally, no wheezing, no crackles. Normal respiratory effort. No accessory muscle use.  Cardiovascular: Regular rate and rhythm, no murmurs / rubs / gallops. No extremity edema. 2+ pedal pulses. No carotid bruits.  Abdomen: no tenderness, no masses palpated. No hepatosplenomegaly. Bowel sounds positive.  Musculoskeletal: no clubbing / cyanosis. No joint deformity upper and lower extremities. Good ROM, no contractures. Normal muscle tone.  Skin: no rashes, lesions, ulcers. No induration Neurologic: CN 2-12 grossly intact. Sensation intact, DTR normal. Strength 5/5 in all 4.  Psychiatric: Normal judgment and insight. Alert and oriented x 3. Normal mood.    Labs on Admission: I have personally reviewed following labs and imaging studies  CBC:  Recent Labs Lab 07/13/17 1856  WBC 10.5  HGB 12.4  HCT 37.6  MCV 96.9  PLT 841   Basic Metabolic Panel:  Recent Labs Lab 07/13/17 1856  NA 133*  K 4.0  CL 98*  CO2 28  GLUCOSE 111*  BUN 8  CREATININE 0.76  CALCIUM 8.9   GFR: CrCl cannot be  calculated (Unknown ideal weight.). Liver Function Tests:  Recent Labs Lab 07/13/17 1856  AST 24  ALT 16  ALKPHOS 148*  BILITOT 0.8  PROT 6.3*  ALBUMIN 2.8*    Recent Labs Lab 07/13/17 1856  LIPASE 33   No results for input(s): AMMONIA in the last 168 hours. Coagulation Profile: No results for input(s): INR, PROTIME in the last 168 hours. Cardiac Enzymes: No results for input(s): CKTOTAL, CKMB, CKMBINDEX, TROPONINI in the last 168 hours. BNP (last 3 results) No results for input(s): PROBNP in the last 8760 hours. HbA1C: No results for input(s): HGBA1C in the last 72 hours. CBG: No results for input(s): GLUCAP in the last 168 hours. Lipid Profile: No results for input(s): CHOL, HDL, LDLCALC, TRIG, CHOLHDL, LDLDIRECT in the last 72 hours. Thyroid Function Tests: No results for input(s): TSH, T4TOTAL, FREET4, T3FREE, THYROIDAB in the last 72 hours. Anemia Panel: No results for input(s): VITAMINB12, FOLATE, FERRITIN, TIBC, IRON, RETICCTPCT in the last 72 hours. Urine analysis:    Component Value Date/Time   COLORURINE STRAW (A) 07/13/2017 1858   APPEARANCEUR CLEAR 07/13/2017 1858   LABSPEC 1.042 (H) 07/13/2017 1858   LABSPEC 1.020 05/04/2016 1006   PHURINE 6.0 07/13/2017 1858   GLUCOSEU NEGATIVE 07/13/2017 1858   GLUCOSEU Negative 05/04/2016 1006   HGBUR SMALL (A) 07/13/2017 [place] 07/13/2017 1858   BILIRUBINUR Negative 05/04/2016 [place] 07/13/2017 1858   PROTEINUR NEGATIVE 07/13/2017 1858   UROBILINOGEN 0.2 05/04/2016 1006   NITRITE NEGATIVE 07/13/2017 1858   LEUKOCYTESUR NEGATIVE 07/13/2017 1858   LEUKOCYTESUR Small 05/04/2016 1006    Radiological Exams on Admission: Ct Abdomen Pelvis W Contrast  Result Date: 07/13/2017 CLINICAL DATA:  Right lower quadrant pain with bloating, nausea, vomiting, and diarrhea intermittently for 5 weeks. Uterine cancer with hysterectomy 6/17. EXAM: CT ABDOMEN AND PELVIS WITH CONTRAST  TECHNIQUE: Multidetector CT imaging of the abdomen and pelvis was performed using the standard protocol following bolus administration of intravenous contrast. CONTRAST:  38mL OMNIPAQUE IOHEXOL 300 MG/ML SOLN, 111mL ISOVUE-300 IOPAMIDOL (ISOVUE-300) INJECTION 61% COMPARISON:  09/08/2016 FINDINGS: Lower chest: Bibasilar atelectasis. Mild motion degradation throughout. Exam  is also degraded by patient body habitus. Quality of exam is significantly decreased relative to the prior. Mild cardiomegaly with multivessel coronary artery atherosclerosis. Incompletely imaged central line. A moderate to large hiatal hernia. Hepatobiliary: Hepatomegaly at 21 cm craniocaudal. No focal liver lesion. Cholecystectomy, without biliary ductal dilatation. Pancreas: Grossly normal pancreas, without duct dilatation or dominant mass. Spleen: Normal in size, without focal abnormality. Adrenals/Urinary Tract: Normal adrenal glands. Normal kidneys, without hydronephrosis. Grossly normal urinary bladder. Stomach/Bowel: Normal distal most stomach. Peripherally enhancing fluid collection within the central pelvis measures on the order of 3.0 x 2.2 cm, including on image 68/ series 2. This is intimately associated with the adjacent sigmoid on image 66/series 2 but may arise from the rectum on image 68/series 2. This is new. Extensive colonic diverticulosis. grossly normal terminal ileum. There is soft tissue fullness which may represent inflammation about the inferior aspect of the ileocecal junction and origin of the appendix. Example image 58/series 2. The appendix is felt to be identified on image 60/series 2. Is relatively short, but dilated at 12 mm, including on coronal image 30. Suggestion of mucosal hyperenhancement. Normal caliber of small bowel loops. Vascular/Lymphatic: Aortic and branch vessel atherosclerosis. No abdominopelvic adenopathy. Reproductive: Hysterectomy.  No adnexal mass. Other: No free intraperitoneal air.  Musculoskeletal: Advanced lumbosacral spondylosis. Moderate S-shaped thoracolumbar spine curvature. IMPRESSION: 1. Moderate degradation secondary to motion and patient body habitus. 2. Small cul-de-sac fluid collection is suspicious for abscess. This is most likely secondary to sigmoid diverticulitis or colitis in the sigmoid or rectum. 3. Second area of probable inflammation about the cecum and origin of the appendix. Although this could represent a second area of colonic diverticulitis, acute appendicitis cannot be excluded, especially given the clinical history. 4. Large hiatal hernia. 5. Coronary artery atherosclerosis. Aortic Atherosclerosis (ICD10-I70.0). 6. Hysterectomy without gross metastatic disease. 7. Hepatomegaly. These results will be called to the ordering clinician or representative by the Radiology Department at the imaging location. Electronically Signed   By: Abigail Miyamoto M.D.   On: 07/13/2017 16:49    EKG: Independently reviewed.  Assessment/Plan Principal Problem:   Diverticulitis of large intestine with abscess Active Problems:   OSA on CPAP   Malignant neoplasm of uterus ([place])   Colitis    1. Diverticulitis with abscess, possible appendicitis - 1. Currently trying to treat medically 2. Zosyn per pharm 3. IVF: NS at 125 cc/hr 4. see Dr. Johney Frame note as well 5. Will keep patient NPO except for sips with meds for now until surgery feels we should advance diet. 6. Per Dr. Johney Frame note they will consult IR to see if drainage is a possibility 2. OSA - continue home CPAP 3. H/o uterine cancer - Appears to be in remission based on history and Dr. Calton Dach office note from June.  DVT prophylaxis: Lovenox Code Status: Full Family Communication: No family in room Disposition Plan: Home after admit Consults called: Dr. Rosendo Gros Admission status: Admit to inpatient   Etta Quill DO [facility] Pager [phone]  If 7AM-7PM, please contact day team  taking care of patient [email or web address] Password [email or web address]  07/13/2017, 10:34 PM

## 2017-07-13 NOTE — Consult Note (Signed)
Reason for Consult:diverticulitis/appendicitis Referring Physician: Dr. [name] is an 82 y.o. female.  HPI: HPI: patient is a 82 year old female comes in with apast surgical history for uterine cancer, and a five-week history of abdominal pain. She states thaton the initial presentation 5 weeks ago she had associated, nausea, vomiting, diarrhea. She states the pain was mainly to the right side of her abdomen. She states that this lasted approximately a week and then resolved.She states that thereafter the pain was on off. She was able to eat during this time period. She does state at this time she has been constipated. She has eaten well over the last week or so.   Patient underwent CT scan recently by her PCP secondary to ongoing pain. The results are as below. Gen. Surgery was consulted at this time for further evaluation and management.  Past Medical History:  Diagnosis Date  . Cancer [facility])    endometrial cancer   . Depression   . Diabetes mellitus without complication (HCC)    borderline , diet controlled   . Esophageal reflux   . Hyperlipidemia    under control  . Hypertension   . Hypothyroidism   . Morbid obesity with BMI of 40.0-44.9, adult ([place])   . Nocturia   . OSA on CPAP   . Osteoarthritis   . Phlebitis    hx of  . Pneumonia    hx of   . Prediabetes    under control with diet  . Spondylosis    with scoliosis  . Urinary frequency     Past Surgical History:  Procedure Laterality Date  . CHOLECYSTECTOMY  age 68  . DILATATION & CURETTAGE/HYSTEROSCOPY WITH MYOSURE N/A 03/04/2016   Procedure: DILATATION & CURETTAGE/HYSTEROSCOPY ;  Surgeon: Azucena Fallen, MD;  Location: [facility];  Service: Gynecology;  Laterality: N/A;  polyp removal   . HERNIA REPAIR  age 89   umblical  . IR GENERIC HISTORICAL  05/15/2016   IR US GUIDE VASC ACCESS RIGHT 05/15/2016 Aletta Edouard, MD [facility]  . IR GENERIC HISTORICAL  05/15/2016   IR FLUORO GUIDE CV LINE RIGHT  05/15/2016 Aletta Edouard, MD [facility]  . LYMPH NODE BIOPSY N/A 03/26/2016   Procedure: Sentinel LYMPH NODE BIOPSY;  Surgeon: Everitt Amber, MD;  Location: [facility];  Service: Gynecology;  Laterality: N/A;  . ROBOTIC ASSISTED TOTAL HYSTERECTOMY WITH BILATERAL SALPINGO OOPHERECTOMY N/A 03/26/2016   Procedure: XI ROBOTIC ASSISTED TOTAL Laproscopic HYSTERECTOMY WITH BILATERAL SALPINGO OOPHORECTOMY;  Surgeon: Everitt Amber, MD;  Location: [facility];  Service: Gynecology;  Laterality: N/A;  . TOTAL KNEE ARTHROPLASTY Left 10/09/2013   Procedure: LEFT TOTAL KNEE ARTHROPLASTY;  Surgeon: [name], MD;  Location: [facility];  Service: Orthopedics;  Laterality: Left;  . TOTAL KNEE ARTHROPLASTY Right 04/09/2014   Procedure: RIGHT TOTAL KNEE ARTHROPLASTY;  Surgeon: [name], MD;  Location: [facility];  Service: Orthopedics;  Laterality: Right;  . TUBAL LIGATION  age 36  . VARICOSE VEIN SURGERY Bilateral age 38  . WISDOM TOOTH EXTRACTION  age 21    Family History  Problem Relation Age of Onset  . Heart disease Mother   . Heart disease Father   . CVA Father     Social History:  reports that she has never smoked. She has never used smokeless tobacco. Her alcohol and drug histories are not on file.  Allergies:  Allergies  Allergen Reactions  . Oxybutynin     Bleeding gums, felt sick  Medications: I have reviewed the patient's current medications.  Results for orders placed or performed during the hospital encounter of 07/13/17 (from the past 48 hour(s))  Lipase, blood     Status: None   Collection Time: 07/13/17  6:56 PM  Result Value Ref Range   Lipase 33 11 - 51 U/L  Comprehensive metabolic panel     Status: Abnormal   Collection Time: 07/13/17  6:56 PM  Result Value Ref Range   Sodium 133 (L) 135 - 145 mmol/L   Potassium 4.0 3.5 - 5.1 mmol/L   Chloride 98 (L) 101 - 111 mmol/L   CO2 28 22 - 32 mmol/L   Glucose, Bld 111 (H) 65 - 99 mg/dL   BUN 8 6 - 20 mg/dL   Creatinine, Ser 0.76 0.44 -  1.00 mg/dL   Calcium 8.9 8.9 - 10.3 mg/dL   Total Protein 6.3 (L) 6.5 - 8.1 g/dL   Albumin 2.8 (L) 3.5 - 5.0 g/dL   AST 24 15 - 41 U/L   ALT 16 14 - 54 U/L   Alkaline Phosphatase 148 (H) 38 - 126 U/L   Total Bilirubin 0.8 0.3 - 1.2 mg/dL   GFR calc non Af Amer >60 >60 mL/min   GFR calc Af Amer >60 >60 mL/min    Comment: (NOTE) The eGFR has been calculated using the CKD EPI equation. This calculation has not been validated in all clinical situations. eGFR's persistently <60 mL/min signify possible Chronic Kidney Disease.    Anion gap 7 5 - 15  CBC     Status: None   Collection Time: 07/13/17  6:56 PM  Result Value Ref Range   WBC 10.5 4.0 - 10.5 K/uL   RBC 3.88 3.87 - 5.11 MIL/uL   Hemoglobin 12.4 12.0 - 15.0 g/dL   HCT 37.6 36.0 - 46.0 %   MCV 96.9 78.0 - 100.0 fL   MCH 32.0 26.0 - 34.0 pg   MCHC 33.0 30.0 - 36.0 g/dL   RDW 13.9 11.5 - 15.5 %   Platelets 361 150 - 400 K/uL  Urinalysis, Routine w reflex microscopic     Status: Abnormal   Collection Time: 07/13/17  6:58 PM  Result Value Ref Range   Color, Urine STRAW (A) YELLOW   APPearance CLEAR CLEAR   Specific Gravity, Urine 1.042 (H) 1.005 - 1.030   pH 6.0 5.0 - 8.0   Glucose, UA NEGATIVE NEGATIVE mg/dL   Hgb urine dipstick SMALL (A) NEGATIVE   Bilirubin Urine NEGATIVE NEGATIVE   Ketones, ur NEGATIVE NEGATIVE mg/dL   Protein, ur NEGATIVE NEGATIVE mg/dL   Nitrite NEGATIVE NEGATIVE   Leukocytes, UA NEGATIVE NEGATIVE   RBC / HPF 0-5 0 - 5 RBC/hpf   WBC, UA 0-5 0 - 5 WBC/hpf   Bacteria, UA NONE SEEN NONE SEEN   Squamous Epithelial / LPF NONE SEEN NONE SEEN    Ct Abdomen Pelvis W Contrast  Result Date: 07/13/2017 CLINICAL DATA:  Right lower quadrant pain with bloating, nausea, vomiting, and diarrhea intermittently for 5 weeks. Uterine cancer with hysterectomy 6/17. EXAM: CT ABDOMEN AND PELVIS WITH CONTRAST TECHNIQUE: Multidetector CT imaging of the abdomen and pelvis was performed using the standard protocol following  bolus administration of intravenous contrast. CONTRAST:  83m OMNIPAQUE IOHEXOL 300 MG/ML SOLN, 1266mISOVUE-300 IOPAMIDOL (ISOVUE-300) INJECTION 61% COMPARISON:  09/08/2016 FINDINGS: Lower chest: Bibasilar atelectasis. Mild motion degradation throughout. Exam is also degraded by patient body habitus. Quality of exam is significantly decreased relative to  the prior. Mild cardiomegaly with multivessel coronary artery atherosclerosis. Incompletely imaged central line. A moderate to large hiatal hernia. Hepatobiliary: Hepatomegaly at 21 cm craniocaudal. No focal liver lesion. Cholecystectomy, without biliary ductal dilatation. Pancreas: Grossly normal pancreas, without duct dilatation or dominant mass. Spleen: Normal in size, without focal abnormality. Adrenals/Urinary Tract: Normal adrenal glands. Normal kidneys, without hydronephrosis. Grossly normal urinary bladder. Stomach/Bowel: Normal distal most stomach. Peripherally enhancing fluid collection within the central pelvis measures on the order of 3.0 x 2.2 cm, including on image 68/ series 2. This is intimately associated with the adjacent sigmoid on image 66/series 2 but may arise from the rectum on image 68/series 2. This is new. Extensive colonic diverticulosis. grossly normal terminal ileum. There is soft tissue fullness which may represent inflammation about the inferior aspect of the ileocecal junction and origin of the appendix. Example image 58/series 2. The appendix is felt to be identified on image 60/series 2. Is relatively short, but dilated at 12 mm, including on coronal image 30. Suggestion of mucosal hyperenhancement. Normal caliber of small bowel loops. Vascular/Lymphatic: Aortic and branch vessel atherosclerosis. No abdominopelvic adenopathy. Reproductive: Hysterectomy.  No adnexal mass. Other: No free intraperitoneal air. Musculoskeletal: Advanced lumbosacral spondylosis. Moderate S-shaped thoracolumbar spine curvature. IMPRESSION: 1. Moderate  degradation secondary to motion and patient body habitus. 2. Small cul-de-sac fluid collection is suspicious for abscess. This is most likely secondary to sigmoid diverticulitis or colitis in the sigmoid or rectum. 3. Second area of probable inflammation about the cecum and origin of the appendix. Although this could represent a second area of colonic diverticulitis, acute appendicitis cannot be excluded, especially given the clinical history. 4. Large hiatal hernia. 5. Coronary artery atherosclerosis. Aortic Atherosclerosis (ICD10-I70.0). 6. Hysterectomy without gross metastatic disease. 7. Hepatomegaly. These results will be called to the ordering clinician or representative by the Radiology Department at the imaging location. Electronically Signed   By: Abigail Miyamoto M.D.   On: 07/13/2017 16:49    Review of Systems  Constitutional: Negative for chills, fever and malaise/fatigue.  HENT: Negative for ear discharge, hearing loss and sore throat.   Eyes: Negative for blurred vision and discharge.  Respiratory: Negative for cough and shortness of breath.   Cardiovascular: Negative for chest pain, orthopnea and leg swelling.  Gastrointestinal: Positive for abdominal pain, constipation, diarrhea, heartburn, nausea and vomiting.  Musculoskeletal: Negative for myalgias and neck pain.  Skin: Negative for itching and rash.  Neurological: Negative for dizziness, focal weakness, seizures and loss of consciousness.  Endo/Heme/Allergies: Negative for environmental allergies. Does not bruise/bleed easily.  Psychiatric/Behavioral: Negative for depression and suicidal ideas.  All other systems reviewed and are negative.  Blood pressure (!) 133/96, pulse 75, temperature 98.2 F (36.8 C), temperature source Oral, resp. rate 16, SpO2 97 %. Physical Exam  Constitutional: She is oriented to person, place, and time. Vital signs are normal. She appears well-developed and well-nourished.  Conversant No acute distress   Eyes: Lids are normal. No scleral icterus.  No lid lag Moist conjunctiva  Neck: No tracheal tenderness present. No thyromegaly present.  No cervical lymphadenopathy  Cardiovascular: Normal rate, regular rhythm and intact distal pulses.   No murmur heard. Respiratory: Effort normal and breath sounds normal. She has no wheezes. She has no rales.  GI: Soft. Bowel sounds are normal. She exhibits no distension. There is no hepatosplenomegaly. There is no tenderness. There is no rebound and no guarding. No hernia.  Neurological: She is alert and oriented to person, place, and time.  Normal gait and station  Skin: Skin is warm. No rash noted. No cyanosis. Nails show no clubbing.  Normal skin turgor  Psychiatric: Judgment normal.  Appropriate affect    Assessment/Plan: 82 year old femalewith diverticulitis, possible appendicitis, and abscess formation. Patient does have a history ofrobotic pelvic surgery secondary to uterine cancer. 1. I think secondary to the fact that there appears to be signs consistent with likely perforated appendicitis and possible colitis and a significant surgical pelvic history would recommend medical management. Patient currently is not septic or have any abdominal pain. I would recommend antibiotics at this time. 2. We will consult IR to see ifdrainage/drain placement is a viable option. 3. We will follow along  Melinda Kerr 07/13/2017, 9:38 PM

## 2017-07-14 ENCOUNTER — Encounter (HOSPITAL_COMMUNITY): Payer: Self-pay | Admitting: General Practice

## 2017-07-14 DIAGNOSIS — K3580 Unspecified acute appendicitis: Secondary | ICD-10-CM

## 2017-07-14 DIAGNOSIS — C541 Malignant neoplasm of endometrium: Secondary | ICD-10-CM

## 2017-07-14 DIAGNOSIS — K5720 Diverticulitis of large intestine with perforation and abscess without bleeding: Principal | ICD-10-CM

## 2017-07-14 LAB — CBC
HEMATOCRIT: 35.7 % — AB (ref 36.0–46.0)
HEMOGLOBIN: 11.2 g/dL — AB (ref 12.0–15.0)
MCH: 30.7 pg (ref 26.0–34.0)
MCHC: 31.4 g/dL (ref 30.0–36.0)
MCV: 97.8 fL (ref 78.0–100.0)
Platelets: 317 10*3/uL (ref 150–400)
RBC: 3.65 MIL/uL — AB (ref 3.87–5.11)
RDW: 14.4 % (ref 11.5–15.5)
WBC: 8.7 10*3/uL (ref 4.0–10.5)

## 2017-07-14 LAB — BASIC METABOLIC PANEL
Anion gap: 6 (ref 5–15)
BUN: 5 mg/dL — AB (ref 6–20)
CHLORIDE: 104 mmol/L (ref 101–111)
CO2: 30 mmol/L (ref 22–32)
Calcium: 8.9 mg/dL (ref 8.9–10.3)
Creatinine, Ser: 0.83 mg/dL (ref 0.44–1.00)
GFR calc Af Amer: >60 mL/min (ref >60)
GFR calc non Af Amer: >60 mL/min (ref >60)
GLUCOSE: 101 mg/dL — AB (ref 65–99)
POTASSIUM: 3.9 mmol/L (ref 3.5–5.1)
SODIUM: 140 mmol/L (ref 135–145)

## 2017-07-14 MED ORDER — ADULT MULTIVITAMIN W/MINERALS CH
1.0000 | ORAL_TABLET | Freq: Every day | ORAL | Status: DC
  Administered 2017-07-14 – 2017-07-16 (×3): 1 via ORAL
  Filled 2017-07-14 (×3): qty 1

## 2017-07-14 MED ORDER — PANTOPRAZOLE SODIUM 40 MG PO TBEC
40.0000 mg | DELAYED_RELEASE_TABLET | Freq: Every day | ORAL | Status: DC
  Administered 2017-07-14 – 2017-07-16 (×3): 40 mg via ORAL
  Filled 2017-07-14 (×3): qty 1

## 2017-07-14 NOTE — Progress Notes (Signed)
Melinda Kerr,[name] 82 years old female  patient admitted from ED. Patient is alert oriented x4. Patient denies for pain. Given instruction about call bell phone and unit routine. Bed in low position, call light   in reach and side rail up x2.

## 2017-07-14 NOTE — Progress Notes (Signed)
Pt refused to wear CPAP for the night. Pt aware if she changes her mind to let nurse know.

## 2017-07-14 NOTE — Progress Notes (Signed)
PROGRESS NOTE    Melinda Kerr  CBJ:[id number] DOB: 1935-06-06 DOA: 07/13/2017 PCP: Maury Dus, MD  Brief Narrative:Melinda Kerr is a 82 y.o. female with medical history significant of uterine cancer.  Patient presents to the ED with c/o 5 week history of abd pain. saw PCP who performed CT scan on her.  Got sent in to ED after CT scan findings appeared to show diverticulitis, abscess, and questionable appendicitis.  Assessment & Plan: 1. Diverticulitis with abscess, possible appendicitis - -appreciate surgical input, -Interventional radiology consulted for percutaneous drain, per IR M.D. Abscess appears too small to drain at this time recommended antibiotic treatment followed by repeat imaging in a few days -cut down IV fluids, start clears today -continue IV Zosyn  2. History of uterine cancer status post robotic hysterectomy -Appears to be in remission based on oncology note from June, follow-up with oncology  3. OSA -Continue home C Pap  DVT prophylaxis: Lovenox Code Status: Full Family Communication: No family in room Disposition Plan: home in 2-3 days of stable  Consultants:   general surgery  Interventional radiology   Procedures:   Antimicrobials:  Zosyn from 10/16  Subjective: Feels much better, denies any abdominal pain nausea vomiting this morning  Objective: Vitals:   07/14/17 0118 07/14/17 0230 07/14/17 0659 07/14/17 1428  BP: (!) 81/63 (!) 102/57 (!) 108/49 (!) 125/54  Pulse: 73 72 76 77  Resp: 18  17 18   Temp: 98.3 F (36.8 C)  98.2 F (36.8 C) 98.1 F (36.7 C)  TempSrc: Oral  Oral Oral  SpO2: 98%  91% 95%    Intake/Output Summary (Last 24 hours) at 07/14/17 1440 Last data filed at 07/14/17 0805  Gross per 24 hour  Intake           593.75 ml  Output              175 ml  Net           418.75 ml   There were no vitals filed for this visit.  Examination:  General exam: Appears calm and comfortable  Respiratory system: Clear to  auscultation. Respiratory effort normal. Cardiovascular system: S1 & S2 heard, RRR. No JVD, murmurs, rubs, gallops Gastrointestinal system: Abdomen is nondistended, soft and nontender.Normal bowel sounds heard. Central nervous system: Alert and oriented. No focal neurological deficits. Extremities: Symmetric 5 x 5 power. Skin: No rashes, lesions or ulcers Psychiatry: Judgement and insight appear normal. Mood & affect appropriate.     Data Reviewed:   CBC:  Recent Labs Lab 07/13/17 1856 07/14/17 0648  WBC 10.5 8.7  HGB 12.4 11.2*  HCT 37.6 35.7*  MCV 96.9 97.8  PLT 361 160   Basic Metabolic Panel:  Recent Labs Lab 07/13/17 1856 07/14/17 0648  NA 133* 140  K 4.0 3.9  CL 98* 104  CO2 28 30  GLUCOSE 111* 101*  BUN 8 5*  CREATININE 0.76 0.83  CALCIUM 8.9 8.9   GFR: CrCl cannot be calculated (Unknown ideal weight.). Liver Function Tests:  Recent Labs Lab 07/13/17 1856  AST 24  ALT 16  ALKPHOS 148*  BILITOT 0.8  PROT 6.3*  ALBUMIN 2.8*    Recent Labs Lab 07/13/17 1856  LIPASE 33   No results for input(s): AMMONIA in the last 168 hours. Coagulation Profile: No results for input(s): INR, PROTIME in the last 168 hours. Cardiac Enzymes: No results for input(s): CKTOTAL, CKMB, CKMBINDEX, TROPONINI in the last 168 hours. BNP (last 3 results) No  results for input(s): PROBNP in the last 8760 hours. HbA1C: No results for input(s): HGBA1C in the last 72 hours. CBG: No results for input(s): GLUCAP in the last 168 hours. Lipid Profile: No results for input(s): CHOL, HDL, LDLCALC, TRIG, CHOLHDL, LDLDIRECT in the last 72 hours. Thyroid Function Tests: No results for input(s): TSH, T4TOTAL, FREET4, T3FREE, THYROIDAB in the last 72 hours. Anemia Panel: No results for input(s): VITAMINB12, FOLATE, FERRITIN, TIBC, IRON, RETICCTPCT in the last 72 hours. Urine analysis:    Component Value Date/Time   COLORURINE STRAW (A) 07/13/2017 1858   APPEARANCEUR CLEAR  07/13/2017 1858   LABSPEC 1.042 (H) 07/13/2017 1858   LABSPEC 1.020 05/04/2016 1006   PHURINE 6.0 07/13/2017 1858   GLUCOSEU NEGATIVE 07/13/2017 1858   GLUCOSEU Negative 05/04/2016 1006   HGBUR SMALL (A) 07/13/2017 [place] 07/13/2017 1858   BILIRUBINUR Negative 05/04/2016 1006   [place] 07/13/2017 1858   PROTEINUR NEGATIVE 07/13/2017 1858   UROBILINOGEN 0.2 05/04/2016 1006   NITRITE NEGATIVE 07/13/2017 1858   LEUKOCYTESUR NEGATIVE 07/13/2017 1858   LEUKOCYTESUR Small 05/04/2016 1006   Sepsis Labs: @LABRCNTIP (procalcitonin:4,lacticidven:4)  )No results found for this or any previous visit (from the past 240 hour(s)).       Radiology Studies: Ct Abdomen Pelvis W Contrast  Result Date: 07/13/2017 CLINICAL DATA:  Right lower quadrant pain with bloating, nausea, vomiting, and diarrhea intermittently for 5 weeks. Uterine cancer with hysterectomy 6/17. EXAM: CT ABDOMEN AND PELVIS WITH CONTRAST TECHNIQUE: Multidetector CT imaging of the abdomen and pelvis was performed using the standard protocol following bolus administration of intravenous contrast. CONTRAST:  32mL OMNIPAQUE IOHEXOL 300 MG/ML SOLN, 119mL ISOVUE-300 IOPAMIDOL (ISOVUE-300) INJECTION 61% COMPARISON:  09/08/2016 FINDINGS: Lower chest: Bibasilar atelectasis. Mild motion degradation throughout. Exam is also degraded by patient body habitus. Quality of exam is significantly decreased relative to the prior. Mild cardiomegaly with multivessel coronary artery atherosclerosis. Incompletely imaged central line. A moderate to large hiatal hernia. Hepatobiliary: Hepatomegaly at 21 cm craniocaudal. No focal liver lesion. Cholecystectomy, without biliary ductal dilatation. Pancreas: Grossly normal pancreas, without duct dilatation or dominant mass. Spleen: Normal in size, without focal abnormality. Adrenals/Urinary Tract: Normal adrenal glands. Normal kidneys, without hydronephrosis. Grossly normal urinary  bladder. Stomach/Bowel: Normal distal most stomach. Peripherally enhancing fluid collection within the central pelvis measures on the order of 3.0 x 2.2 cm, including on image 68/ series 2. This is intimately associated with the adjacent sigmoid on image 66/series 2 but may arise from the rectum on image 68/series 2. This is new. Extensive colonic diverticulosis. grossly normal terminal ileum. There is soft tissue fullness which may represent inflammation about the inferior aspect of the ileocecal junction and origin of the appendix. Example image 58/series 2. The appendix is felt to be identified on image 60/series 2. Is relatively short, but dilated at 12 mm, including on coronal image 30. Suggestion of mucosal hyperenhancement. Normal caliber of small bowel loops. Vascular/Lymphatic: Aortic and branch vessel atherosclerosis. No abdominopelvic adenopathy. Reproductive: Hysterectomy.  No adnexal mass. Other: No free intraperitoneal air. Musculoskeletal: Advanced lumbosacral spondylosis. Moderate S-shaped thoracolumbar spine curvature. IMPRESSION: 1. Moderate degradation secondary to motion and patient body habitus. 2. Small cul-de-sac fluid collection is suspicious for abscess. This is most likely secondary to sigmoid diverticulitis or colitis in the sigmoid or rectum. 3. Second area of probable inflammation about the cecum and origin of the appendix. Although this could represent a second area of colonic diverticulitis, acute appendicitis cannot be excluded, especially given the  clinical history. 4. Large hiatal hernia. 5. Coronary artery atherosclerosis. Aortic Atherosclerosis (ICD10-I70.0). 6. Hysterectomy without gross metastatic disease. 7. Hepatomegaly. These results will be called to the ordering clinician or representative by the Radiology Department at the imaging location. Electronically Signed   By: Abigail Miyamoto M.D.   On: 07/13/2017 16:49        Scheduled Meds: . allopurinol  300 mg Oral Q  breakfast  . citalopram  20 mg Oral Daily  . enoxaparin (LOVENOX) injection  40 mg Subcutaneous Q24H  . fesoterodine  8 mg Oral Daily  . gatifloxacin  1 drop Left Eye QID  . hydrOXYzine  25 mg Oral QHS  . ketorolac  1 drop Left Eye QID  . levothyroxine  125 mcg Oral QAC breakfast  . multivitamin with minerals  1 tablet Oral Daily  . pantoprazole  40 mg Oral Daily  . prednisoLONE acetate  1 drop Left Eye QID   Continuous Infusions: . sodium chloride 75 mL/hr (07/14/17 1319)  . piperacillin-tazobactam (ZOSYN)  IV Stopped (07/14/17 0906)     LOS: 1 day    Time spent: 68min    Domenic Polite, MD [facility] Page via [email or web address], password [email or web address] After 7PM please contact night-coverage  07/14/2017, 2:40 PM

## 2017-07-14 NOTE — Progress Notes (Signed)
On assessment, patient noted to have multiple papules (some of which have scabbed over) widespread on dorsal aspect of right foot.  Patient stated she felt they were "ant bites" and her PCP had given her some 'antibiotic' cream for it.  Patient placed on contact precautions and MD notified.  No pain at area noted.

## 2017-07-14 NOTE — Progress Notes (Signed)
[facility] Progress Note     Subjective: CC: diverticulitis Patient denies abdominal pain, n/v. Wanting to eat. Reports that other members of family have diverticulitis, but she has never had it before. Has had 2 episodes in the last 5 weeks. Last colonoscopy was 10 yrs ago and she does not remember being told she had diverticulosis at that time.   Objective: Vital signs in last 24 hours: Temp:  [98.2 F (36.8 C)-98.3 F (36.8 C)] 98.2 F (36.8 C) (10/17 0659) Pulse Rate:  [66-76] 76 (10/17 0659) Resp:  [16-18] 17 (10/17 0659) BP: (81-133)/(49-96) 108/49 (10/17 0659) SpO2:  [91 %-98 %] 91 % (10/17 0659) Last BM Date: 07/08/17  Intake/Output from previous day: 10/16 0701 - 10/17 0700 In: 593.8 [I.V.:543.8; IV Piggyback:50] Out: -  Intake/Output this shift: Total I/O In: -  Out: 175 [Urine:175]  PE: Gen:  Alert, NAD, pleasant Card:  Regular rate and rhythm, pedal pulses 2+ BL Pulm:  Normal effort, clear to auscultation bilaterally Abd: Soft, non-tender, non-distended, bowel sounds present in all 4 quadrants, no HSM Skin: warm and dry; erythema and pustules on left foot Psych: A&Ox3   Lab Results:   Recent Labs  07/13/17 1856 07/14/17 0648  WBC 10.5 8.7  HGB 12.4 11.2*  HCT 37.6 35.7*  PLT 361 317   BMET  Recent Labs  07/13/17 1856 07/14/17 0648  NA 133* 140  K 4.0 3.9  CL 98* 104  CO2 28 30  GLUCOSE 111* 101*  BUN 8 5*  CREATININE 0.76 0.83  CALCIUM 8.9 8.9   PT/INR No results for input(s): LABPROT, INR in the last 72 hours. CMP     Component Value Date/Time   NA 140 07/14/2017 0648   NA 138 03/01/2017 1325   K 3.9 07/14/2017 0648   K 4.2 03/01/2017 1325   CL 104 07/14/2017 0648   CO2 30 07/14/2017 0648   CO2 27 03/01/2017 1325   GLUCOSE 101 (H) 07/14/2017 0648   GLUCOSE 135 03/01/2017 1325   BUN 5 (L) 07/14/2017 0648   BUN 22.5 03/01/2017 1325   CREATININE 0.83 07/14/2017 0648   CREATININE 0.9 03/01/2017 1325   CALCIUM 8.9  07/14/2017 0648   CALCIUM 10.4 03/01/2017 1325   PROT 6.3 (L) 07/13/2017 1856   PROT 6.6 03/01/2017 1325   ALBUMIN 2.8 (L) 07/13/2017 1856   ALBUMIN 3.7 03/01/2017 1325   AST 24 07/13/2017 1856   AST 19 03/01/2017 1325   ALT 16 07/13/2017 1856   ALT 14 03/01/2017 1325   ALKPHOS 148 (H) 07/13/2017 1856   ALKPHOS 100 03/01/2017 1325   BILITOT 0.8 07/13/2017 1856   BILITOT 0.66 03/01/2017 1325   GFRNONAA >60 07/14/2017 0648   GFRAA >60 07/14/2017 0648   Lipase     Component Value Date/Time   LIPASE 33 07/13/2017 1856       Studies/Results: Ct Abdomen Pelvis W Contrast  Result Date: 07/13/2017 CLINICAL DATA:  Right lower quadrant pain with bloating, nausea, vomiting, and diarrhea intermittently for 5 weeks. Uterine cancer with hysterectomy 6/17. EXAM: CT ABDOMEN AND PELVIS WITH CONTRAST TECHNIQUE: Multidetector CT imaging of the abdomen and pelvis was performed using the standard protocol following bolus administration of intravenous contrast. CONTRAST:  71mL OMNIPAQUE IOHEXOL 300 MG/ML SOLN, 126mL ISOVUE-300 IOPAMIDOL (ISOVUE-300) INJECTION 61% COMPARISON:  09/08/2016 FINDINGS: Lower chest: Bibasilar atelectasis. Mild motion degradation throughout. Exam is also degraded by patient body habitus. Quality of exam is significantly decreased relative to the prior. Mild cardiomegaly with  multivessel coronary artery atherosclerosis. Incompletely imaged central line. A moderate to large hiatal hernia. Hepatobiliary: Hepatomegaly at 21 cm craniocaudal. No focal liver lesion. Cholecystectomy, without biliary ductal dilatation. Pancreas: Grossly normal pancreas, without duct dilatation or dominant mass. Spleen: Normal in size, without focal abnormality. Adrenals/Urinary Tract: Normal adrenal glands. Normal kidneys, without hydronephrosis. Grossly normal urinary bladder. Stomach/Bowel: Normal distal most stomach. Peripherally enhancing fluid collection within the central pelvis measures on the order  of 3.0 x 2.2 cm, including on image 68/ series 2. This is intimately associated with the adjacent sigmoid on image 66/series 2 but may arise from the rectum on image 68/series 2. This is new. Extensive colonic diverticulosis. grossly normal terminal ileum. There is soft tissue fullness which may represent inflammation about the inferior aspect of the ileocecal junction and origin of the appendix. Example image 58/series 2. The appendix is felt to be identified on image 60/series 2. Is relatively short, but dilated at 12 mm, including on coronal image 30. Suggestion of mucosal hyperenhancement. Normal caliber of small bowel loops. Vascular/Lymphatic: Aortic and branch vessel atherosclerosis. No abdominopelvic adenopathy. Reproductive: Hysterectomy.  No adnexal mass. Other: No free intraperitoneal air. Musculoskeletal: Advanced lumbosacral spondylosis. Moderate S-shaped thoracolumbar spine curvature. IMPRESSION: 1. Moderate degradation secondary to motion and patient body habitus. 2. Small cul-de-sac fluid collection is suspicious for abscess. This is most likely secondary to sigmoid diverticulitis or colitis in the sigmoid or rectum. 3. Second area of probable inflammation about the cecum and origin of the appendix. Although this could represent a second area of colonic diverticulitis, acute appendicitis cannot be excluded, especially given the clinical history. 4. Large hiatal hernia. 5. Coronary artery atherosclerosis. Aortic Atherosclerosis (ICD10-I70.0). 6. Hysterectomy without gross metastatic disease. 7. Hepatomegaly. These results will be called to the ordering clinician or representative by the Radiology Department at the imaging location. Electronically Signed   By: Abigail Miyamoto M.D.   On: 07/13/2017 16:49    Anti-infectives: Anti-infectives    Start     Dose/Rate Route Frequency Ordered Stop   07/14/17 0600  piperacillin-tazobactam (ZOSYN) IVPB 3.375 g     3.375 g 12.5 mL/hr over 240 Minutes  Intravenous Every 8 hours 07/13/17 2220     07/13/17 2115  piperacillin-tazobactam (ZOSYN) IVPB 3.375 g     3.375 g 100 mL/hr over 30 Minutes Intravenous  Once 07/13/17 2112 07/14/17 0028       Assessment/Plan OSA Hx of uterine cancer - hx of robotic surgery Insect bites to left foot  Diverticulitis with abscess, possible appendicitis - IR consulted for abscess drainage - feel that abscess is too small to drain, recommending IV abx and repeat CT in a few days - WBC 8.5, afebrile - no pain and clinical exam benign - advance to clears   FEN: clears VTE: SCDs ID: IV Zosyn (10/16>>)  Plan: Clear liquid diet. Follow exam. Continue IV abx.   LOS: 1 day    Brigid Re , [facility] 07/14/2017, 10:11 AM Pager: [phone] Consults: [phone] Mon-Fri 7:00 am-4:30 pm Sat-Sun 7:00 am-11:30 am

## 2017-07-14 NOTE — Progress Notes (Signed)
  Request seen for CT drain for pelvic abscess.  Images reviewed by Dr. Kathlene Cote.  Pelvic process is too small to aspirate or drain, fluid component is only 1 cm in diameter.  Recommend IV antibiotics and repeat CT scan in a few days to evaluate collection.  Pilar Westergaard S Turon Kilmer PA-C 07/14/2017 10:08 AM

## 2017-07-15 DIAGNOSIS — G4733 Obstructive sleep apnea (adult) (pediatric): Secondary | ICD-10-CM

## 2017-07-15 DIAGNOSIS — Z9989 Dependence on other enabling machines and devices: Secondary | ICD-10-CM

## 2017-07-15 DIAGNOSIS — N739 Female pelvic inflammatory disease, unspecified: Secondary | ICD-10-CM

## 2017-07-15 LAB — CBC
HEMATOCRIT: 36.4 % (ref 36.0–46.0)
Hemoglobin: 11.7 g/dL — ABNORMAL LOW (ref 12.0–15.0)
MCH: 31.4 pg (ref 26.0–34.0)
MCHC: 32.1 g/dL (ref 30.0–36.0)
MCV: 97.6 fL (ref 78.0–100.0)
Platelets: 305 10*3/uL (ref 150–400)
RBC: 3.73 MIL/uL — AB (ref 3.87–5.11)
RDW: 14.2 % (ref 11.5–15.5)
WBC: 8.4 10*3/uL (ref 4.0–10.5)

## 2017-07-15 LAB — BASIC METABOLIC PANEL
ANION GAP: 7 (ref 5–15)
BUN: <5 mg/dL — ABNORMAL LOW (ref 6–20)
CHLORIDE: 104 mmol/L (ref 101–111)
CO2: 29 mmol/L (ref 22–32)
Calcium: 9 mg/dL (ref 8.9–10.3)
Creatinine, Ser: 0.77 mg/dL (ref 0.44–1.00)
GFR calc Af Amer: >60 mL/min (ref >60)
GFR calc non Af Amer: >60 mL/min (ref >60)
GLUCOSE: 96 mg/dL (ref 65–99)
POTASSIUM: 4 mmol/L (ref 3.5–5.1)
Sodium: 140 mmol/L (ref 135–145)

## 2017-07-15 MED ORDER — CITALOPRAM HYDROBROMIDE 20 MG PO TABS
20.0000 mg | ORAL_TABLET | Freq: Every day | ORAL | Status: DC
  Administered 2017-07-15: 20 mg via ORAL
  Filled 2017-07-15: qty 1

## 2017-07-15 MED ORDER — TRAMADOL HCL 50 MG PO TABS
50.0000 mg | ORAL_TABLET | Freq: Four times a day (QID) | ORAL | Status: DC | PRN
  Administered 2017-07-15: 50 mg via ORAL
  Filled 2017-07-15: qty 1

## 2017-07-15 MED ORDER — TRAMADOL HCL 50 MG PO TABS
50.0000 mg | ORAL_TABLET | Freq: Once | ORAL | Status: AC
  Administered 2017-07-15: 50 mg via ORAL
  Filled 2017-07-15: qty 1

## 2017-07-15 NOTE — Progress Notes (Signed)
Patient refused CPAP for the night  

## 2017-07-15 NOTE — Consult Note (Signed)
[facility] CM Primary Care Navigator  07/15/2017  Melinda Kerr Aug 20, 1935 [id number]   Met with patient at the bedside to identify possible discharge needs.  Patient reports having right abdominal pain, nausea/ vomiting and weakness for about two weeks that resulted to this admission.  Patient endorses Melinda Kerr with [facility] asherprimary care provider.   [place] [facility] [place] [facility] to obtain medications without any problem. Patient reports managing her ownmedications at home with use of"pill box" system filled weekly.   According to patient, shewasdriving prior to admission, but her niece (Melinda Kerr) or good friend Melinda Kerr) can provide transportation to her doctors' appointments after discharge if needed.  Patient lives alone. Her niece (lives 2 miles away) will be her primary caregiver at home as stated.   Anticipated discharge plan is home when stable per MD note.  Patientexpressed understanding to callprimary care provider's officewhen she returnshome for a post discharge follow-up appointment within a week or sooner if needed. Patient letter (with PCP's contact number) was provided as her reminder.  Explained to patient about [facility] CM services available for health management at home but she denies any needs or concerns at this time. Patient reports DM is being managed with close follow-up with primary care provider, blood sugar checks and diet-control. Patient states not taking any diabetes medication so far.  Patientvoiced understanding to seek referral from primary care provider to [facility] care management if necessary and appropriate for services in the future.  [facility] care management information provided for future needs that she may have.  She had verbally agreed and opted for EMMI calls to follow-up with her recovery at home.  Referral made to [place] calls  after discharge.     For questions, please contact:  [name], BSN, RN- [facility] Primary Care Navigator  Telephone: [phone] [place]

## 2017-07-15 NOTE — Progress Notes (Signed)
[facility] Progress Note     Subjective: CC: diverticulitis Patient without pain, tolerating CLD. Passing flatus. Denies n/v.  UOP good. VSS.   Objective: Vital signs in last 24 hours: Temp:  [98.1 F (36.7 C)-98.7 F (37.1 C)] 98.4 F (36.9 C) (10/18 0800) Pulse Rate:  [77-85] 79 (10/18 0800) Resp:  [18-19] 18 (10/18 0800) BP: (114-142)/(54-81) 114/66 (10/18 0800) SpO2:  [92 %-95 %] 92 % (10/18 0800) Last BM Date: 07/14/17  Intake/Output from previous day: 10/17 0701 - 10/18 0700 In: 3060.8 [P.O.:900; I.V.:2010.8; IV Piggyback:150] Out: 2250 [Urine:2250] Intake/Output this shift: Total I/O In: -  Out: 250 [Urine:250]  PE: Gen:  Alert, NAD, pleasant Card:  Regular rate and rhythm, pedal pulses 2+ BL Pulm:  Normal effort, clear to auscultation bilaterally Abd: Soft, non-tender, non-distended, bowel sounds present, no HSM Skin: warm and dry, no rashes  Psych: A&Ox3   Lab Results:   Recent Labs  07/14/17 0648 07/15/17 0437  WBC 8.7 8.4  HGB 11.2* 11.7*  HCT 35.7* 36.4  PLT 317 305   BMET  Recent Labs  07/14/17 0648 07/15/17 0437  NA 140 140  K 3.9 4.0  CL 104 104  CO2 30 29  GLUCOSE 101* 96  BUN 5* <5*  CREATININE 0.83 0.77  CALCIUM 8.9 9.0   PT/INR No results for input(s): LABPROT, INR in the last 72 hours. CMP     Component Value Date/Time   NA 140 07/15/2017 0437   NA 138 03/01/2017 1325   K 4.0 07/15/2017 0437   K 4.2 03/01/2017 1325   CL 104 07/15/2017 0437   CO2 29 07/15/2017 0437   CO2 27 03/01/2017 1325   GLUCOSE 96 07/15/2017 0437   GLUCOSE 135 03/01/2017 1325   BUN <5 (L) 07/15/2017 0437   BUN 22.5 03/01/2017 1325   CREATININE 0.77 07/15/2017 0437   CREATININE 0.9 03/01/2017 1325   CALCIUM 9.0 07/15/2017 0437   CALCIUM 10.4 03/01/2017 1325   PROT 6.3 (L) 07/13/2017 1856   PROT 6.6 03/01/2017 1325   ALBUMIN 2.8 (L) 07/13/2017 1856   ALBUMIN 3.7 03/01/2017 1325   AST 24 07/13/2017 1856   AST 19 03/01/2017 1325    ALT 16 07/13/2017 1856   ALT 14 03/01/2017 1325   ALKPHOS 148 (H) 07/13/2017 1856   ALKPHOS 100 03/01/2017 1325   BILITOT 0.8 07/13/2017 1856   BILITOT 0.66 03/01/2017 1325   GFRNONAA >60 07/15/2017 0437   GFRAA >60 07/15/2017 0437   Lipase     Component Value Date/Time   LIPASE 33 07/13/2017 1856       Studies/Results: Ct Abdomen Pelvis W Contrast  Result Date: 07/13/2017 CLINICAL DATA:  Right lower quadrant pain with bloating, nausea, vomiting, and diarrhea intermittently for 5 weeks. Uterine cancer with hysterectomy 6/17. EXAM: CT ABDOMEN AND PELVIS WITH CONTRAST TECHNIQUE: Multidetector CT imaging of the abdomen and pelvis was performed using the standard protocol following bolus administration of intravenous contrast. CONTRAST:  9mL OMNIPAQUE IOHEXOL 300 MG/ML SOLN, 147mL ISOVUE-300 IOPAMIDOL (ISOVUE-300) INJECTION 61% COMPARISON:  09/08/2016 FINDINGS: Lower chest: Bibasilar atelectasis. Mild motion degradation throughout. Exam is also degraded by patient body habitus. Quality of exam is significantly decreased relative to the prior. Mild cardiomegaly with multivessel coronary artery atherosclerosis. Incompletely imaged central line. A moderate to large hiatal hernia. Hepatobiliary: Hepatomegaly at 21 cm craniocaudal. No focal liver lesion. Cholecystectomy, without biliary ductal dilatation. Pancreas: Grossly normal pancreas, without duct dilatation or dominant mass. Spleen: Normal in size, without focal  abnormality. Adrenals/Urinary Tract: Normal adrenal glands. Normal kidneys, without hydronephrosis. Grossly normal urinary bladder. Stomach/Bowel: Normal distal most stomach. Peripherally enhancing fluid collection within the central pelvis measures on the order of 3.0 x 2.2 cm, including on image 68/ series 2. This is intimately associated with the adjacent sigmoid on image 66/series 2 but may arise from the rectum on image 68/series 2. This is new. Extensive colonic diverticulosis.  grossly normal terminal ileum. There is soft tissue fullness which may represent inflammation about the inferior aspect of the ileocecal junction and origin of the appendix. Example image 58/series 2. The appendix is felt to be identified on image 60/series 2. Is relatively short, but dilated at 12 mm, including on coronal image 30. Suggestion of mucosal hyperenhancement. Normal caliber of small bowel loops. Vascular/Lymphatic: Aortic and branch vessel atherosclerosis. No abdominopelvic adenopathy. Reproductive: Hysterectomy.  No adnexal mass. Other: No free intraperitoneal air. Musculoskeletal: Advanced lumbosacral spondylosis. Moderate S-shaped thoracolumbar spine curvature. IMPRESSION: 1. Moderate degradation secondary to motion and patient body habitus. 2. Small cul-de-sac fluid collection is suspicious for abscess. This is most likely secondary to sigmoid diverticulitis or colitis in the sigmoid or rectum. 3. Second area of probable inflammation about the cecum and origin of the appendix. Although this could represent a second area of colonic diverticulitis, acute appendicitis cannot be excluded, especially given the clinical history. 4. Large hiatal hernia. 5. Coronary artery atherosclerosis. Aortic Atherosclerosis (ICD10-I70.0). 6. Hysterectomy without gross metastatic disease. 7. Hepatomegaly. These results will be called to the ordering clinician or representative by the Radiology Department at the imaging location. Electronically Signed   By: Abigail Miyamoto M.D.   On: 07/13/2017 16:49    Anti-infectives: Anti-infectives    Start     Dose/Rate Route Frequency Ordered Stop   07/14/17 0600  piperacillin-tazobactam (ZOSYN) IVPB 3.375 g     3.375 g 12.5 mL/hr over 240 Minutes Intravenous Every 8 hours 07/13/17 2220     07/13/17 2115  piperacillin-tazobactam (ZOSYN) IVPB 3.375 g     3.375 g 100 mL/hr over 30 Minutes Intravenous  Once 07/13/17 2112 07/14/17 0028       Assessment/Plan OSA Hx of  uterine cancer - hx of robotic surgery Insect bites to left foot  Diverticulitis with abscess, possible appendicitis - IR consulted for abscess drainage - feel that abscess is too small to drain, recommending IV abx and repeat CT in a few days - WBC 8.4, afebrile - no pain and clinical exam benign - advance to regular    FEN: regular diet VTE: SCDs ID: IV Zosyn (10/16>>)  Plan: Regular diet. Continue IV abx. Patient will need colonoscopy in 6-8 weeks. Given frequency of recent episodes diverticulitis, can f/u with [facility] after colonoscopy if elective surgery desired.   LOS: 2 days    Brigid Re , [facility] 07/15/2017, 10:20 AM Pager: [phone] Consults: [phone] Mon-Fri 7:00 am-4:30 pm Sat-Sun 7:00 am-11:30 am

## 2017-07-15 NOTE — Progress Notes (Signed)
PROGRESS NOTE    Melinda Kerr  FIE:[id number] DOB: [birth year] DOA: 07/13/2017 PCP: Maury Dus, MD  Brief Narrative:Melinda Kerr is a 82 y.o. female with medical history significant of uterine cancer.  Patient presents to the ED with c/o 5 week history of abd pain. saw PCP who performed CT scan on her.  Got sent in to ED after CT scan findings appeared to show diverticulitis, abscess, and questionable appendicitis.  Assessment & Plan: 1. Diverticulitis with abscess, possible appendicitis - -appreciate surgical input, -Interventional radiology consulted for percutaneous drain, per IR M.D. Abscess appears too small to drain at this time recommended antibiotic treatment followed by repeat imaging in a few days -now on clears, day 2 of IV Zosyn -defer timing of repeat imaging to CCS/IR -OOB, ambulate, PT eval  2. History of uterine cancer status post robotic hysterectomy -Appears to be in remission based on oncology note from June, follow-up with oncology  3. OSA -Continue home C Pap  DVT prophylaxis: Lovenox Code Status: Full Family Communication: No family in room Disposition Plan: home in few days  Consultants:   general surgery  Interventional radiology   Procedures:   Antimicrobials:  Zosyn from 10/16  Subjective: Ok overall, no abd pain, no N/V  Objective: Vitals:   07/14/17 2208 07/15/17 0554 07/15/17 0800 07/15/17 1549  BP: 119/64 (!) 142/81 114/66   Pulse: 78 85 79   Resp: 18 19 18    Temp: 98.7 F (37.1 C) 98.5 F (36.9 C) 98.4 F (36.9 C)   TempSrc: Oral Oral Oral   SpO2: 94% 92% 92%   Weight:    111.8 kg (246 lb 8 oz)    Intake/Output Summary (Last 24 hours) at 07/15/17 1551 Last data filed at 07/15/17 0930  Gross per 24 hour  Intake             1750 ml  Output             2225 ml  Net             -475 ml   Filed Weights   07/15/17 1549  Weight: 111.8 kg (246 lb 8 oz)    Examination:  Gen: Awake, Alert, Oriented X 3, obese, no  distress HEENT: PERRLA, Neck supple, no JVD Lungs: Good air movement bilaterally, CTAB CVS: RRR,No Gallops,Rubs or new Murmurs Abd: soft, Non tender, non distended, BS present Extremities: No Cyanosis, Clubbing or edema Skin: no new rashes Psychiatry: Judgement and insight appear normal. Mood & affect appropriate.     Data Reviewed:   CBC:  Recent Labs Lab 07/13/17 1856 07/14/17 0648 07/15/17 0437  WBC 10.5 8.7 8.4  HGB 12.4 11.2* 11.7*  HCT 37.6 35.7* 36.4  MCV 96.9 97.8 97.6  PLT 361 317 166   Basic Metabolic Panel:  Recent Labs Lab 07/13/17 1856 07/14/17 0648 07/15/17 0437  NA 133* 140 140  K 4.0 3.9 4.0  CL 98* 104 104  CO2 28 30 29   GLUCOSE 111* 101* 96  BUN 8 5* <5*  CREATININE 0.76 0.83 0.77  CALCIUM 8.9 8.9 9.0   GFR: Estimated Creatinine Clearance: 66.2 mL/min (by C-G formula based on SCr of 0.77 mg/dL). Liver Function Tests:  Recent Labs Lab 07/13/17 1856  AST 24  ALT 16  ALKPHOS 148*  BILITOT 0.8  PROT 6.3*  ALBUMIN 2.8*    Recent Labs Lab 07/13/17 1856  LIPASE 33   No results for input(s): AMMONIA in the last 168 hours. Coagulation Profile:  No results for input(s): INR, PROTIME in the last 168 hours. Cardiac Enzymes: No results for input(s): CKTOTAL, CKMB, CKMBINDEX, TROPONINI in the last 168 hours. BNP (last 3 results) No results for input(s): PROBNP in the last 8760 hours. HbA1C: No results for input(s): HGBA1C in the last 72 hours. CBG: No results for input(s): GLUCAP in the last 168 hours. Lipid Profile: No results for input(s): CHOL, HDL, LDLCALC, TRIG, CHOLHDL, LDLDIRECT in the last 72 hours. Thyroid Function Tests: No results for input(s): TSH, T4TOTAL, FREET4, T3FREE, THYROIDAB in the last 72 hours. Anemia Panel: No results for input(s): VITAMINB12, FOLATE, FERRITIN, TIBC, IRON, RETICCTPCT in the last 72 hours. Urine analysis:    Component Value Date/Time   COLORURINE STRAW (A) 07/13/2017 1858   APPEARANCEUR CLEAR  07/13/2017 1858   LABSPEC 1.042 (H) 07/13/2017 1858   LABSPEC 1.020 05/04/2016 1006   PHURINE 6.0 07/13/2017 1858   GLUCOSEU NEGATIVE 07/13/2017 1858   GLUCOSEU Negative 05/04/2016 1006   HGBUR SMALL (A) 07/13/2017 [place] 07/13/2017 1858   BILIRUBINUR Negative 05/04/2016 1006   [place] 07/13/2017 1858   PROTEINUR NEGATIVE 07/13/2017 1858   UROBILINOGEN 0.2 05/04/2016 1006   NITRITE NEGATIVE 07/13/2017 1858   LEUKOCYTESUR NEGATIVE 07/13/2017 1858   LEUKOCYTESUR Small 05/04/2016 1006   Sepsis Labs: @LABRCNTIP (procalcitonin:4,lacticidven:4)  )No results found for this or any previous visit (from the past 240 hour(s)).       Radiology Studies: Ct Abdomen Pelvis W Contrast  Result Date: 07/13/2017 CLINICAL DATA:  Right lower quadrant pain with bloating, nausea, vomiting, and diarrhea intermittently for 5 weeks. Uterine cancer with hysterectomy 6/17. EXAM: CT ABDOMEN AND PELVIS WITH CONTRAST TECHNIQUE: Multidetector CT imaging of the abdomen and pelvis was performed using the standard protocol following bolus administration of intravenous contrast. CONTRAST:  27mL OMNIPAQUE IOHEXOL 300 MG/ML SOLN, 129mL ISOVUE-300 IOPAMIDOL (ISOVUE-300) INJECTION 61% COMPARISON:  09/08/2016 FINDINGS: Lower chest: Bibasilar atelectasis. Mild motion degradation throughout. Exam is also degraded by patient body habitus. Quality of exam is significantly decreased relative to the prior. Mild cardiomegaly with multivessel coronary artery atherosclerosis. Incompletely imaged central line. A moderate to large hiatal hernia. Hepatobiliary: Hepatomegaly at 21 cm craniocaudal. No focal liver lesion. Cholecystectomy, without biliary ductal dilatation. Pancreas: Grossly normal pancreas, without duct dilatation or dominant mass. Spleen: Normal in size, without focal abnormality. Adrenals/Urinary Tract: Normal adrenal glands. Normal kidneys, without hydronephrosis. Grossly normal urinary  bladder. Stomach/Bowel: Normal distal most stomach. Peripherally enhancing fluid collection within the central pelvis measures on the order of 3.0 x 2.2 cm, including on image 68/ series 2. This is intimately associated with the adjacent sigmoid on image 66/series 2 but may arise from the rectum on image 68/series 2. This is new. Extensive colonic diverticulosis. grossly normal terminal ileum. There is soft tissue fullness which may represent inflammation about the inferior aspect of the ileocecal junction and origin of the appendix. Example image 58/series 2. The appendix is felt to be identified on image 60/series 2. Is relatively short, but dilated at 12 mm, including on coronal image 30. Suggestion of mucosal hyperenhancement. Normal caliber of small bowel loops. Vascular/Lymphatic: Aortic and branch vessel atherosclerosis. No abdominopelvic adenopathy. Reproductive: Hysterectomy.  No adnexal mass. Other: No free intraperitoneal air. Musculoskeletal: Advanced lumbosacral spondylosis. Moderate S-shaped thoracolumbar spine curvature. IMPRESSION: 1. Moderate degradation secondary to motion and patient body habitus. 2. Small cul-de-sac fluid collection is suspicious for abscess. This is most likely secondary to sigmoid diverticulitis or colitis in the sigmoid or rectum. 3.  Second area of probable inflammation about the cecum and origin of the appendix. Although this could represent a second area of colonic diverticulitis, acute appendicitis cannot be excluded, especially given the clinical history. 4. Large hiatal hernia. 5. Coronary artery atherosclerosis. Aortic Atherosclerosis (ICD10-I70.0). 6. Hysterectomy without gross metastatic disease. 7. Hepatomegaly. These results will be called to the ordering clinician or representative by the Radiology Department at the imaging location. Electronically Signed   By: Abigail Miyamoto M.D.   On: 07/13/2017 16:49        Scheduled Meds: . allopurinol  300 mg Oral Q  breakfast  . citalopram  20 mg Oral QHS  . enoxaparin (LOVENOX) injection  40 mg Subcutaneous Q24H  . fesoterodine  8 mg Oral Daily  . gatifloxacin  1 drop Left Eye QID  . hydrOXYzine  25 mg Oral QHS  . ketorolac  1 drop Left Eye QID  . levothyroxine  125 mcg Oral QAC breakfast  . multivitamin with minerals  1 tablet Oral Daily  . pantoprazole  40 mg Oral Daily  . prednisoLONE acetate  1 drop Left Eye QID   Continuous Infusions: . piperacillin-tazobactam (ZOSYN)  IV 3.375 g (07/15/17 1342)     LOS: 2 days    Time spent: 94min    Domenic Polite, MD [facility] Page via [email or web address], password [email or web address] After 7PM please contact night-coverage  07/15/2017, 3:51 PM

## 2017-07-16 DIAGNOSIS — K3580 Unspecified acute appendicitis: Secondary | ICD-10-CM

## 2017-07-16 LAB — CBC
HCT: 34.5 % — ABNORMAL LOW (ref 36.0–46.0)
Hemoglobin: 10.8 g/dL — ABNORMAL LOW (ref 12.0–15.0)
MCH: 30.6 pg (ref 26.0–34.0)
MCHC: 31.3 g/dL (ref 30.0–36.0)
MCV: 97.7 fL (ref 78.0–100.0)
PLATELETS: 307 10*3/uL (ref 150–400)
RBC: 3.53 MIL/uL — AB (ref 3.87–5.11)
RDW: 14.6 % (ref 11.5–15.5)
WBC: 7.2 10*3/uL (ref 4.0–10.5)

## 2017-07-16 MED ORDER — METRONIDAZOLE 500 MG PO TABS: 500.0000 mg | ORAL_TABLET | Freq: Two times a day (BID) | ORAL | Status: DC

## 2017-07-16 MED ORDER — AMOXICILLIN-POT CLAVULANATE 875-125 MG PO TABS: 1.0000 | ORAL_TABLET | Freq: Two times a day (BID) | ORAL | 0 refills | Status: DC

## 2017-07-16 MED ORDER — AMOXICILLIN-POT CLAVULANATE 875-125 MG PO TABS
1.0000 | ORAL_TABLET | Freq: Two times a day (BID) | ORAL | Status: DC
  Administered 2017-07-16: 1 via ORAL
  Filled 2017-07-16: qty 1

## 2017-07-16 MED ORDER — CIPROFLOXACIN HCL 500 MG PO TABS
500.0000 mg | ORAL_TABLET | Freq: Two times a day (BID) | ORAL | Status: DC
  Administered 2017-07-16: 500 mg via ORAL

## 2017-07-16 NOTE — Care Management Note (Signed)
Case Management Note  Patient Details  Name: ALEJANDRIA WESSELLS MRN: [id number] Date of Birth: [birth year]  Subjective/Objective:               Admitted secondary to abdominal pain, found to have diverticulitis with an abscess.     Kathrynn Humble (Niece)     [phone]       PCP: Maury Dus  Action/Plan: Per pt's eval./recommendation:Home health PT;Supervision for mobility/OOB.  Plan is to d/c to home today with home health services in place. Pt states she will have assistance from family/friends if needed once d/c.  Expected Discharge Date:  07/16/17               Expected Discharge Plan:  [place]  In-House Referral:     Discharge planning Services  CM Consult  Post Acute Care Choice:    Choice offered to:  Patient  DME Arranged:    DME Agency:     HH Arranged:  PT [place]:  [facility] [place], pending MD's order. CM has requested order from MD.  Status of Service:  Completed, signed off  If discussed at [place] of Stay Meetings, dates discussed:    Additional Comments:  Sharin Mons, RN 07/16/2017, 4:07 PM

## 2017-07-16 NOTE — Progress Notes (Signed)
Pt discharged via car. Reviewed discharge information and education with pt and family. Given prescription for augmentin. VSS. IV line removed, site is clean, dry and intact.  All belongings sent with pt.

## 2017-07-16 NOTE — Final Consult Note (Signed)
[facility] Progress Note     Subjective: CC: no complaint Patient hoping to get home today. Denies abdominal pain, n/v. Tolerating regular diet. VSS.   Objective: Vital signs in last 24 hours: Temp:  [98.2 F (36.8 C)-98.8 F (37.1 C)] 98.2 F (36.8 C) (10/19 0502) Pulse Rate:  [73-78] 78 (10/19 0502) Resp:  [18] 18 (10/18 2225) BP: (108-136)/(58-87) 136/87 (10/19 0502) SpO2:  [94 %] 94 % (10/19 0502) Weight:  [111.8 kg (246 lb 8 oz)] 111.8 kg (246 lb 8 oz) (10/18 1549) Last BM Date: 07/14/17  Intake/Output from previous day: 10/18 0701 - 10/19 0700 In: -  Out: 1350 [Urine:1350] Intake/Output this shift: No intake/output data recorded.  PE: Gen:  Alert, NAD, pleasant Card:  Regular rate and rhythm, pedal pulses 2+ BL Pulm:  Normal effort, clear to auscultation bilaterally Abd: Soft, non-tender, non-distended, bowel sounds present, no HSM Skin: warm and dry, no rashes  Psych: A&Ox3   Lab Results:   Recent Labs  07/15/17 0437 07/16/17 0448  WBC 8.4 7.2  HGB 11.7* 10.8*  HCT 36.4 34.5*  PLT 305 307   BMET  Recent Labs  07/14/17 0648 07/15/17 0437  NA 140 140  K 3.9 4.0  CL 104 104  CO2 30 29  GLUCOSE 101* 96  BUN 5* <5*  CREATININE 0.83 0.77  CALCIUM 8.9 9.0   PT/INR No results for input(s): LABPROT, INR in the last 72 hours. CMP     Component Value Date/Time   NA 140 07/15/2017 0437   NA 138 03/01/2017 1325   K 4.0 07/15/2017 0437   K 4.2 03/01/2017 1325   CL 104 07/15/2017 0437   CO2 29 07/15/2017 0437   CO2 27 03/01/2017 1325   GLUCOSE 96 07/15/2017 0437   GLUCOSE 135 03/01/2017 1325   BUN <5 (L) 07/15/2017 0437   BUN 22.5 03/01/2017 1325   CREATININE 0.77 07/15/2017 0437   CREATININE 0.9 03/01/2017 1325   CALCIUM 9.0 07/15/2017 0437   CALCIUM 10.4 03/01/2017 1325   PROT 6.3 (L) 07/13/2017 1856   PROT 6.6 03/01/2017 1325   ALBUMIN 2.8 (L) 07/13/2017 1856   ALBUMIN 3.7 03/01/2017 1325   AST 24 07/13/2017 1856   AST  19 03/01/2017 1325   ALT 16 07/13/2017 1856   ALT 14 03/01/2017 1325   ALKPHOS 148 (H) 07/13/2017 1856   ALKPHOS 100 03/01/2017 1325   BILITOT 0.8 07/13/2017 1856   BILITOT 0.66 03/01/2017 1325   GFRNONAA >60 07/15/2017 0437   GFRAA >60 07/15/2017 0437   Lipase     Component Value Date/Time   LIPASE 33 07/13/2017 1856       Studies/Results: No results found.  Anti-infectives: Anti-infectives    Start     Dose/Rate Route Frequency Ordered Stop   07/16/17 1000  metroNIDAZOLE (FLAGYL) tablet 500 mg  Status:  Discontinued     500 mg Oral Every 12 hours 07/16/17 0914 07/16/17 0918   07/16/17 1000  amoxicillin-clavulanate (AUGMENTIN) 875-125 MG per tablet 1 tablet     1 tablet Oral Every 12 hours 07/16/17 0918     07/16/17 0915  ciprofloxacin (CIPRO) tablet 500 mg  Status:  Discontinued     500 mg Oral 2 times daily 07/16/17 0914 07/16/17 0918   07/14/17 0600  piperacillin-tazobactam (ZOSYN) IVPB 3.375 g  Status:  Discontinued     3.375 g 12.5 mL/hr over 240 Minutes Intravenous Every 8 hours 07/13/17 2220 07/16/17 0914   07/13/17 2115  piperacillin-tazobactam (  ZOSYN) IVPB 3.375 g     3.375 g 100 mL/hr over 30 Minutes Intravenous  Once 07/13/17 2112 07/14/17 0028       Assessment/Plan OSA Hx of uterine cancer - hx of robotic surgery Insect bites to left foot  Diverticulitis with abscess, possible appendicitis - IR consulted for abscess drainage - feel that abscess is too small to drain, recommending IV abx and repeat CT in a few days - WBC 7.2, afebrile - no pain and clinical exam benign - tolerating regular diet    FEN: regular diet VTE: SCDs ID: IV Zosyn (10/16>>)  Plan: Fine for discharge from a surgical standpoint. Recommend ID consult for duration of antibiotic therapy given that abscess not drainable.   LOS: 3 days    Brigid Re , [facility] 07/16/2017, 12:30 PM Pager: [phone] Consults: [phone] Mon-Fri 7:00 am-4:30  pm Sat-Sun 7:00 am-11:30 am

## 2017-07-16 NOTE — Progress Notes (Signed)
PROGRESS NOTE    Melinda Kerr  IZT:[id number] DOB: [birth year] DOA: 07/13/2017 PCP: Maury Dus, MD  Brief Narrative:Melinda Kerr is a 82 y.o. female with medical history significant of uterine cancer.  Patient presents to the ED with c/o 5 week history of abd pain. saw PCP who performed CT scan on her.  Got sent in to ED after CT scan findings appeared to show diverticulitis, abscess, and questionable appendicitis.  Assessment & Plan: 1. Diverticulitis with small abscess, possible appendicitis - -appreciate surgical input, -Interventional radiology consulted for percutaneous drain, per IR M.D. Abscess appears too small to drain at this time recommended antibiotic treatment  -tolerating regular diet, no abd tenderness -change to PO Augmentin, I called and discussed case with ID Dr.Hatcher who recommended 3 weeks of Po Augmentin  -PT eval today -home later today pending PT eval  2. History of uterine cancer status post robotic hysterectomy -Appears to be in remission based on oncology note from June, follow-up with oncology  3. OSA -Continue home C Pap  DVT prophylaxis: Lovenox Code Status: Full Family Communication: No family in room Disposition Plan: home today  Consultants:   general surgery  Interventional radiology   Procedures:   Antimicrobials:  Zosyn from 10/16  Subjective: Some neck discomfort, no abd pain, tolerating diet, + BM  Objective: Vitals:   07/15/17 0800 07/15/17 1549 07/15/17 2225 07/16/17 0502  BP: 114/66  (!) 108/58 136/87  Pulse: 79  73 78  Resp: 18  18   Temp: 98.4 F (36.9 C)  98.8 F (37.1 C) 98.2 F (36.8 C)  TempSrc: Oral  Oral Oral  SpO2: 92%  94% 94%  Weight:  111.8 kg (246 lb 8 oz)    Height:  5' (1.524 m)      Intake/Output Summary (Last 24 hours) at 07/16/17 0918 Last data filed at 07/16/17 0500  Gross per 24 hour  Intake                0 ml  Output             1150 ml  Net            -1150 ml   Filed Weights   07/15/17 1549  Weight: 111.8 kg (246 lb 8 oz)    Examination:  Gen: Awake, Alert, Oriented X 3, obese, no distress HEENT: PERRLA, Neck supple, no JVD Lungs: Good air movement bilaterally, CTAB CVS: RRR,No Gallops,Rubs or new Murmurs Abd: soft, Non tender, non distended, BS present Extremities: No Cyanosis, Clubbing or edema Skin: no new rashes Psychiatry: Judgement and insight appear normal. Mood & affect appropriate.     Data Reviewed:   CBC:  Recent Labs Lab 07/13/17 1856 07/14/17 0648 07/15/17 0437 07/16/17 0448  WBC 10.5 8.7 8.4 7.2  HGB 12.4 11.2* 11.7* 10.8*  HCT 37.6 35.7* 36.4 34.5*  MCV 96.9 97.8 97.6 97.7  PLT 361 317 305 382   Basic Metabolic Panel:  Recent Labs Lab 07/13/17 1856 07/14/17 0648 07/15/17 0437  NA 133* 140 140  K 4.0 3.9 4.0  CL 98* 104 104  CO2 28 30 29   GLUCOSE 111* 101* 96  BUN 8 5* <5*  CREATININE 0.76 0.83 0.77  CALCIUM 8.9 8.9 9.0   GFR: Estimated Creatinine Clearance: 63.8 mL/min (by C-G formula based on SCr of 0.77 mg/dL). Liver Function Tests:  Recent Labs Lab 07/13/17 1856  AST 24  ALT 16  ALKPHOS 148*  BILITOT 0.8  PROT 6.3*  ALBUMIN 2.8*    Recent Labs Lab 07/13/17 1856  LIPASE 33   No results for input(s): AMMONIA in the last 168 hours. Coagulation Profile: No results for input(s): INR, PROTIME in the last 168 hours. Cardiac Enzymes: No results for input(s): CKTOTAL, CKMB, CKMBINDEX, TROPONINI in the last 168 hours. BNP (last 3 results) No results for input(s): PROBNP in the last 8760 hours. HbA1C: No results for input(s): HGBA1C in the last 72 hours. CBG: No results for input(s): GLUCAP in the last 168 hours. Lipid Profile: No results for input(s): CHOL, HDL, LDLCALC, TRIG, CHOLHDL, LDLDIRECT in the last 72 hours. Thyroid Function Tests: No results for input(s): TSH, T4TOTAL, FREET4, T3FREE, THYROIDAB in the last 72 hours. Anemia Panel: No results for input(s): VITAMINB12, FOLATE, FERRITIN,  TIBC, IRON, RETICCTPCT in the last 72 hours. Urine analysis:    Component Value Date/Time   COLORURINE STRAW (A) 07/13/2017 1858   APPEARANCEUR CLEAR 07/13/2017 1858   LABSPEC 1.042 (H) 07/13/2017 1858   LABSPEC 1.020 05/04/2016 1006   PHURINE 6.0 07/13/2017 1858   GLUCOSEU NEGATIVE 07/13/2017 1858   GLUCOSEU Negative 05/04/2016 1006   HGBUR SMALL (A) 07/13/2017 [place] 07/13/2017 1858   BILIRUBINUR Negative 05/04/2016 [place] 07/13/2017 1858   PROTEINUR NEGATIVE 07/13/2017 1858   UROBILINOGEN 0.2 05/04/2016 1006   NITRITE NEGATIVE 07/13/2017 1858   LEUKOCYTESUR NEGATIVE 07/13/2017 1858   LEUKOCYTESUR Small 05/04/2016 1006   Sepsis Labs: @LABRCNTIP (procalcitonin:4,lacticidven:4)  )No results found for this or any previous visit (from the past 240 hour(s)).       Radiology Studies: No results found.      Scheduled Meds: . allopurinol  300 mg Oral Q breakfast  . amoxicillin-clavulanate  1 tablet Oral Q12H  . citalopram  20 mg Oral QHS  . enoxaparin (LOVENOX) injection  40 mg Subcutaneous Q24H  . fesoterodine  8 mg Oral Daily  . gatifloxacin  1 drop Left Eye QID  . hydrOXYzine  25 mg Oral QHS  . ketorolac  1 drop Left Eye QID  . levothyroxine  125 mcg Oral QAC breakfast  . multivitamin with minerals  1 tablet Oral Daily  . pantoprazole  40 mg Oral Daily  . prednisoLONE acetate  1 drop Left Eye QID   Continuous Infusions:    LOS: 3 days    Time spent: 29min    Domenic Polite, MD [facility] Page via [email or web address], password [email or web address] After 7PM please contact night-coverage  07/16/2017, 9:18 AM

## 2017-07-16 NOTE — Evaluation (Signed)
Physical Therapy Evaluation Patient Details Name: Melinda Kerr MRN: [id number] DOB: 08-23-1935 Today's Date: 07/16/2017   History of Present Illness  Pt is an 82 y/o female admitted secondary to abdominal pain, found to have diverticulitis with an abcess. PMH including but not limited to  Clinical Impression  Pt presented sitting OOB in recliner chair, awake and willing to participate in therapy session. Prior to admission, pt reported that she ambulates with use of rollator and is independent with ADLs. Pt lives alone but has a niece that can provide some assistance PRN if needed. Pt ambulated a short distance in hallway with use of RW and min guard. Pt reported that at baseline she is only able to tolerate ambulating short distance secondary to fatiguing quickly. Pt would continue to benefit from skilled physical therapy services at this time while admitted and after d/c to address the below listed limitations in order to improve overall safety and independence with functional mobility.      Follow Up Recommendations Home health PT;Supervision for mobility/OOB    Equipment Recommendations  None recommended by PT    Recommendations for Other Services       Precautions / Restrictions Precautions Precautions: Fall Restrictions Weight Bearing Restrictions: No      Mobility  Bed Mobility               General bed mobility comments: pt OOB in recliner chair upon arrival  Transfers Overall transfer level: Needs assistance Equipment used: Rolling walker (2 wheeled) Transfers: Sit to/from Stand Sit to Stand: Min guard         General transfer comment: increased time and effort, cueing for hand placement, min guard for safety  Ambulation/Gait Ambulation/Gait assistance: Min guard Ambulation Distance (Feet): 50 Feet Assistive device: Rolling walker (2 wheeled) Gait Pattern/deviations: Step-through pattern;Decreased step length - right;Decreased step length -  left;Decreased stride length Gait velocity: decreased   General Gait Details: pt limited secondary to fatigue and poor endurance. pt stated that at baseline she is unable to ambulate long distances and utilizes a rollator so that she has somewhere to sit when she fatigues. pt mildly unsteady with ambulation but no overt LOB or need for physical assistance, min guard for safety  Stairs            Wheelchair Mobility    Modified Rankin (Stroke Patients Only)       Balance Overall balance assessment: Needs assistance Sitting-balance support: Feet supported Sitting balance-Leahy Scale: Fair     Standing balance support: During functional activity;Bilateral upper extremity supported Standing balance-Leahy Scale: Poor Standing balance comment: reliant on bilateral UEs on RW                             Pertinent Vitals/Pain Pain Assessment: No/denies pain    Home Living Family/patient expects to be discharged to:: Private residence Living Arrangements: Alone Available Help at Discharge: Family;Available PRN/intermittently Type of Home: House Home Access: Stairs to enter   Entrance Stairs-Number of Steps: 1 Home Layout: One level Home Equipment: Walker - 4 wheels;Cane - single point;Shower seat      Prior Function Level of Independence: Independent with assistive device(s)         Comments: ambulates with use of rollator     Hand Dominance        Extremity/Trunk Assessment   Upper Extremity Assessment Upper Extremity Assessment: Overall WFL for tasks assessed    Lower Extremity Assessment  Lower Extremity Assessment: Generalized weakness    Cervical / Trunk Assessment Cervical / Trunk Assessment: Kyphotic  Communication   Communication: No difficulties  Cognition Arousal/Alertness: Awake/alert Behavior During Therapy: WFL for tasks assessed/performed Overall Cognitive Status: Within Functional Limits for tasks assessed                                         General Comments      Exercises     Assessment/Plan    PT Assessment Patient needs continued PT services  PT Problem List Decreased activity tolerance;Decreased mobility;Decreased balance;Decreased coordination       PT Treatment Interventions DME instruction;Gait training;Stair training;Functional mobility training;Therapeutic activities;Therapeutic exercise;Balance training;Neuromuscular re-education;Patient/family education    PT Goals (Current goals can be found in the Care Plan section)  Acute Rehab PT Goals Patient Stated Goal: return home today PT Goal Formulation: With patient Time For Goal Achievement: 07/30/17 Potential to Achieve Goals: Good    Frequency Min 3X/week   Barriers to discharge        Co-evaluation               AM-PAC PT "6 Clicks" Daily Activity  Outcome Measure Difficulty turning over in bed (including adjusting bedclothes, sheets and blankets)?: A Little Difficulty moving from lying on back to sitting on the side of the bed? : A Little Difficulty sitting down on and standing up from a chair with arms (e.g., wheelchair, bedside commode, etc,.)?: Unable Help needed moving to and from a bed to chair (including a wheelchair)?: A Little Help needed walking in hospital room?: A Little Help needed climbing 3-5 steps with a railing? : A Little 6 Click Score: 16    End of Session Equipment Utilized During Treatment: Gait belt Activity Tolerance: Patient limited by fatigue Patient left: in chair;with call bell/phone within reach;with chair alarm set Nurse Communication: Mobility status PT Visit Diagnosis: Unsteadiness on feet (R26.81);Other abnormalities of gait and mobility (R26.89)    Time: 4097-3532 PT Time Calculation (min) (ACUTE ONLY): 27 min   Charges:   PT Evaluation $PT Eval Moderate Complexity: 1 Mod PT Treatments $Gait Training: 8-22 mins   PT G Codes:        Hol[place], PT,  [place] 07/16/2017, 3:16 PM

## 2017-07-19 ENCOUNTER — Ambulatory Visit: Payer: Medicare Other | Admitting: Gynecologic Oncology

## 2017-07-27 ENCOUNTER — Other Ambulatory Visit: Payer: Self-pay | Admitting: *Deleted

## 2017-07-27 NOTE — Patient Outreach (Signed)
[facility]) Care Management  07/27/2017  Melinda Kerr 26-Aug-1935 [id number]  EMMI- General Discharge RED ON EMMI ALERT DAY#: 4 DATE: 07/23/17 RED ALERT: Other questions/problems? Yes Lost Interest in doing things? Yes  Outreach attempt #1 to patient. No answer. RN CM left HIPAA compliant message along with contact info.     Plan: RN CM will contact patient within one week.    Lake Bells, RN, BSN, MHA/MSL, [place] Telephonic Care Manager Coordinator [facility] Direct Phone: [phone] Toll Free: [phone] Fax: [phone]

## 2017-07-28 ENCOUNTER — Other Ambulatory Visit: Payer: Self-pay | Admitting: *Deleted

## 2017-07-28 ENCOUNTER — Ambulatory Visit: Payer: Self-pay | Admitting: *Deleted

## 2017-07-28 NOTE — Patient Outreach (Signed)
[facility]) Care Management  07/28/2017  Melinda Kerr 12-15-1934 [id number]  EMMI- General Discharge RED ON EMMI ALERT DAY#: 4 DATE: 07/23/17 RED ALERT: Other questions/problems? Yes Lost Interest in doing things? Yes  Outreach telephone call to patient. HIPAA identifiers verified with patient. Patient confirmed, she was recently in the hospital for Diverticulitis with abscess. Patient stated, she had a CAT scan at the [facility]. She was told by someone at the [facility] to go directly to the emergency room where a doctor would meet her. Patient stated, "She arrived to the emergency room and had to wait several hours before she was able to speak with someone". She described how "upset and disappointed she was with the services provided to her". She confirmed her problems stemmed from the treatment given by the hospital staff. She reported, the EMMI automated questionnaire recorded the wrong answer for lost interest in doing things. Patient stated, she missed RN CM telephone call on 07/27/17 due to running errands in the community. She stated, she assist with a fund raiser yearly. Patient stated, she had a fall while doing her errands. Patient explained how she fell while attempting to sit in a rolling chair. She voiced having bruising on her hips and legs. She stated, her wrist was bruised and swollen. Per patient, she applied blue mineral oil to her extremities. She didn't think any of her bones were broken/fractured.   Patient reported, she received her hospital discharge instructions. She read and understood the discharge instructions. Patient stated, she was not given a diet on her discharge instructions. She called her primary MD's office to inquire about an appropriate diet for Diverticulitis. She stated, the doctor sent her a sheet in the mail about the type of foods she should consume. Patient has an upcoming appointment with primary MD on 08/02/17. She plans  to schedule a colonoscopy appointment within 6 to 8 weeks. Patient stated, she manages her medications. She takes her medications as prescribed by the MD. Patient reported, she refused home health PT with [facility] due to not needing any therapy services.   Plan: RN CM will notify [facility] Case Management Assistant regarding case closure.  RM CM will send successful outreach letter to patient. RN CM will send patient EMMI educational material.  RN CM advised patient to contact RNCM for any needs or concerns.   Lake Bells, RN, BSN, MHA/MSL, [place] Telephonic Care Manager Coordinator [facility] Direct Phone: [phone] Toll Free: [phone] Fax: [phone]

## 2017-07-29 ENCOUNTER — Encounter: Payer: Self-pay | Admitting: *Deleted

## 2017-08-02 DIAGNOSIS — S3992XA Unspecified injury of lower back, initial encounter: Secondary | ICD-10-CM | POA: Diagnosis not present

## 2017-08-02 DIAGNOSIS — S3992XD Unspecified injury of lower back, subsequent encounter: Secondary | ICD-10-CM | POA: Diagnosis not present

## 2017-08-02 DIAGNOSIS — K5720 Diverticulitis of large intestine with perforation and abscess without bleeding: Secondary | ICD-10-CM | POA: Diagnosis not present

## 2017-08-02 DIAGNOSIS — Z09 Encounter for follow-up examination after completed treatment for conditions other than malignant neoplasm: Secondary | ICD-10-CM | POA: Diagnosis not present

## 2017-08-02 NOTE — Discharge Summary (Signed)
Physician Discharge Summary  Melinda Kerr UEA:[id number] DOB: 1935-04-19 DOA: 07/13/2017  PCP: Maury Dus, MD  Admit date: 07/13/2017 Discharge date: 07/16/2017  Time spent: 35 minutes  Recommendations for Outpatient Follow-up:  1. PCP in 1 week   Discharge Diagnoses:  Principal Problem:   Diverticulitis of large intestine with abscess Active Problems:   OSA on CPAP   Malignant neoplasm of uterus ([place])   Colitis   Acute appendicitis   Discharge Condition: stable  Diet recommendation: soft diet  Filed Weights   07/15/17 1549  Weight: 111.8 kg (246 lb 8 oz)    History of present illness:  Melinda L Shepardis a 82 y.o.femalewith medical history significant of uterine cancer. Patient presents to the ED with c/o 5 week history of abd pain. saw PCP who performed CT scan on her. Got sent in to ED after CT scan findings appeared to show diverticulitis, abscess, and questionable appendicitis  Hospital Course:  1. Diverticulitis with small abscess, possible appendicitis - -Surgery consulted, recommended IR consult for possible perc drain -Interventional radiology consulted for percutaneous drain, per IR M.D- Abscess appears too small to drain at this time recommended antibiotic treatment  -clinically much improved with Abx, bowel rest, now tolerating regular diet, no abd tenderness -Abx changed to PO Augmentin, I called and discussed case with ID Dr.Hatcher who recommended 3 weeks of Po Augmentin  -PT eval completed, HHPT set up -Advised FU with GI in 2-3weeks  2. History of uterine cancer status post robotic hysterectomy -Appears to be in remission based on oncology note from June, follow-up with oncology  3. OSA -Continue home C Pap  Consultants:   general surgery  Interventional radiology    Discharge Exam: Vitals:   07/16/17 0502 07/16/17 1400  BP: 136/87 (!) 119/96  Pulse: 78 87  Resp:  20  Temp: 98.2 F (36.8 C) 98.5 F (36.9 C)  SpO2: 94%      General: AAOx3 Cardiovascular: S1S2/RRR Respiratory: CTAB  Discharge Instructions   Discharge Instructions    Diet - low sodium heart healthy   Complete by:  As directed    Increase activity slowly   Complete by:  As directed      Discharge Medication List as of 07/16/2017  4:50 PM    START taking these medications   Details  amoxicillin-clavulanate (AUGMENTIN) 875-125 MG tablet Take 1 tablet by mouth every 12 (twelve) hours. For 3 weeks, Starting Fri 07/16/2017, Print      CONTINUE these medications which have NOT CHANGED   Details  acetaminophen (TYLENOL) 500 MG tablet Take 1,000 mg by mouth 2 (two) times daily. , Until Discontinued, Historical Med    allopurinol (ZYLOPRIM) 300 MG tablet Take 300 mg by mouth daily with breakfast. , Until Discontinued, Historical Med    calcium gluconate 500 MG tablet Takes 1 tablet in am and 2 in the evening., Historical Med    cetirizine (ZYRTEC) 10 MG tablet Take 10 mg by mouth daily., Historical Med    citalopram (CELEXA) 20 MG tablet Take 20 mg by mouth daily. , Starting Thu 01/10/2015, Historical Med    Cyanocobalamin (VITAMIN B-12 PO) Take by mouth., Historical Med    Ferrous Sulfate 90 (18 Fe) MG TABS Take 1 tablet by mouth daily., Until Discontinued, Historical Med    furosemide (LASIX) 20 MG tablet Take 20 mg by mouth daily., Starting 01/10/2015, Until Discontinued, Historical Med    HAWTHORNE BERRY PO Take 500 mg by mouth 2 (two) times daily., Historical  Med    hydrOXYzine (ATARAX/VISTARIL) 25 MG tablet Take 25 mg by mouth at bedtime., Until Discontinued, Historical Med    ketorolac (ACULAR) 0.5 % ophthalmic solution Place 1 drop into the left eye 4 (four) times daily., Starting Thu 06/17/2017, Historical Med    levothyroxine (SYNTHROID, LEVOTHROID) 125 MCG tablet Take 125 mcg by mouth daily before breakfast., Until Discontinued, Historical Med    lidocaine-prilocaine (EMLA) cream Apply 1-2 hours prior to [facility]  access as directed, Normal    lisinopril (PRINIVIL,ZESTRIL) 20 MG tablet Take 20 mg by mouth daily with breakfast. , Until Discontinued, Historical Med    moxifloxacin (VIGAMOX) 0.5 % ophthalmic solution Place 1 drop into the left eye 4 (four) times daily., Starting Thu 06/17/2017, Historical Med    Multiple Vitamins-Minerals (MULTIVITAMIN WITH MINERALS) tablet Take 1 tablet by mouth daily., Historical Med    mupirocin ointment (BACTROBAN) 2 % Apply 1 application topically 2 (two) times daily as needed., Starting Mon 07/12/2017, Historical Med    Omega-3 Fatty Acids (FISH OIL PO) Take 1 tablet by mouth daily., Historical Med    omeprazole (PRILOSEC OTC) 20 MG tablet Take 20 mg by mouth daily., Until Discontinued, Historical Med    polyethylene glycol (MIRALAX / GLYCOLAX) packet Take 17 g by mouth daily as needed for mild constipation or moderate constipation., Starting 04/12/2014, Until Discontinued, No Print    prednisoLONE acetate (PRED FORTE) 1 % ophthalmic suspension 1 drop 4 (four) times daily., Historical Med    pyridOXINE (VITAMIN B-6) 100 MG tablet Take 100 mg by mouth daily., Historical Med    tolterodine (DETROL LA) 4 MG 24 hr capsule Take 4 mg by mouth daily., Until Discontinued, Historical Med    traMADol (ULTRAM) 50 MG tablet Take 1-2 tablets (50-100 mg total) by mouth every 6 (six) hours as needed (mild pain)., Starting 04/12/2014, Until Discontinued, Print    vitamin C (ASCORBIC ACID) 500 MG tablet Take 500 mg by mouth daily., Historical Med       Allergies  Allergen Reactions  . Oxybutynin     Bleeding gums, felt sick   Follow-up Information    Surgery, [place] Follow up.   Specialty:  General Surgery Why:  Recommend colonoscopy in 6-8 weeks. You may call after colonoscopy if you wish to discuss elective procedures for diverticulitis.  Contact information: [address] [place] [address] [phone]        Maury Dus, MD. Schedule  an appointment as soon as possible for a visit in 1 week(s).   Specialty:  Family Medicine Contact information: [place] [address] [place], [facility] Follow up.   Specialty:  Home Health Services Why:  home health services arranged, office will call and set up home visits Contact information: [address] [place] [address] [phone]            The results of significant diagnostics from this hospitalization (including imaging, microbiology, ancillary and laboratory) are listed below for reference.    Significant Diagnostic Studies: Ct Abdomen Pelvis W Contrast  Result Date: 07/13/2017 CLINICAL DATA:  Right lower quadrant pain with bloating, nausea, vomiting, and diarrhea intermittently for 5 weeks. Uterine cancer with hysterectomy 6/17. EXAM: CT ABDOMEN AND PELVIS WITH CONTRAST TECHNIQUE: Multidetector CT imaging of the abdomen and pelvis was performed using the standard protocol following bolus administration of intravenous contrast. CONTRAST:  5mL OMNIPAQUE IOHEXOL 300  MG/ML SOLN, 162mL ISOVUE-300 IOPAMIDOL (ISOVUE-300) INJECTION 61% COMPARISON:  09/08/2016 FINDINGS: Lower chest: Bibasilar atelectasis. Mild motion degradation throughout. Exam is also degraded by patient body habitus. Quality of exam is significantly decreased relative to the prior. Mild cardiomegaly with multivessel coronary artery atherosclerosis. Incompletely imaged central line. A moderate to large hiatal hernia. Hepatobiliary: Hepatomegaly at 21 cm craniocaudal. No focal liver lesion. Cholecystectomy, without biliary ductal dilatation. Pancreas: Grossly normal pancreas, without duct dilatation or dominant mass. Spleen: Normal in size, without focal abnormality. Adrenals/Urinary Tract: Normal adrenal glands. Normal kidneys, without hydronephrosis. Grossly normal urinary bladder. Stomach/Bowel: Normal distal most stomach. Peripherally enhancing  fluid collection within the central pelvis measures on the order of 3.0 x 2.2 cm, including on image 68/ series 2. This is intimately associated with the adjacent sigmoid on image 66/series 2 but may arise from the rectum on image 68/series 2. This is new. Extensive colonic diverticulosis. grossly normal terminal ileum. There is soft tissue fullness which may represent inflammation about the inferior aspect of the ileocecal junction and origin of the appendix. Example image 58/series 2. The appendix is felt to be identified on image 60/series 2. Is relatively short, but dilated at 12 mm, including on coronal image 30. Suggestion of mucosal hyperenhancement. Normal caliber of small bowel loops. Vascular/Lymphatic: Aortic and branch vessel atherosclerosis. No abdominopelvic adenopathy. Reproductive: Hysterectomy.  No adnexal mass. Other: No free intraperitoneal air. Musculoskeletal: Advanced lumbosacral spondylosis. Moderate S-shaped thoracolumbar spine curvature. IMPRESSION: 1. Moderate degradation secondary to motion and patient body habitus. 2. Small cul-de-sac fluid collection is suspicious for abscess. This is most likely secondary to sigmoid diverticulitis or colitis in the sigmoid or rectum. 3. Second area of probable inflammation about the cecum and origin of the appendix. Although this could represent a second area of colonic diverticulitis, acute appendicitis cannot be excluded, especially given the clinical history. 4. Large hiatal hernia. 5. Coronary artery atherosclerosis. Aortic Atherosclerosis (ICD10-I70.0). 6. Hysterectomy without gross metastatic disease. 7. Hepatomegaly. These results will be called to the ordering clinician or representative by the Radiology Department at the imaging location. Electronically Signed   By: Abigail Miyamoto M.D.   On: 07/13/2017 16:49    Microbiology: No results found for this or any previous visit (from the past 240 hour(s)).   Labs: Basic Metabolic Panel: No  results for input(s): NA, K, CL, CO2, GLUCOSE, BUN, CREATININE, CALCIUM, MG, PHOS in the last 168 hours. Liver Function Tests: No results for input(s): AST, ALT, ALKPHOS, BILITOT, PROT, ALBUMIN in the last 168 hours. No results for input(s): LIPASE, AMYLASE in the last 168 hours. No results for input(s): AMMONIA in the last 168 hours. CBC: No results for input(s): WBC, NEUTROABS, HGB, HCT, MCV, PLT in the last 168 hours. Cardiac Enzymes: No results for input(s): CKTOTAL, CKMB, CKMBINDEX, TROPONINI in the last 168 hours. BNP: BNP (last 3 results) No results for input(s): BNP in the last 8760 hours.  ProBNP (last 3 results) No results for input(s): PROBNP in the last 8760 hours.  CBG: No results for input(s): GLUCAP in the last 168 hours.     SignedDomenic Polite MD.  [facility] 08/02/2017, 4:30 PM

## 2017-08-03 ENCOUNTER — Ambulatory Visit
Admission: RE | Admit: 2017-08-03 | Discharge: 2017-08-03 | Disposition: A | Discharge status: Home / Self Care | Payer: Medicare Other | Source: Ambulatory Visit | Attending: Family Medicine | Admitting: Family Medicine

## 2017-08-03 ENCOUNTER — Other Ambulatory Visit: Payer: Self-pay | Admitting: Family Medicine

## 2017-08-03 DIAGNOSIS — S3992XA Unspecified injury of lower back, initial encounter: Secondary | ICD-10-CM

## 2017-08-03 DIAGNOSIS — S300XXA Contusion of lower back and pelvis, initial encounter: Secondary | ICD-10-CM | POA: Diagnosis not present

## 2017-08-09 ENCOUNTER — Ambulatory Visit: Payer: Medicare Other | Admitting: Podiatry

## 2017-08-11 ENCOUNTER — Ambulatory Visit: Payer: Medicare Other | Attending: Gynecologic Oncology | Admitting: Gynecologic Oncology

## 2017-08-11 ENCOUNTER — Encounter: Payer: Self-pay | Admitting: Gynecologic Oncology

## 2017-08-11 ENCOUNTER — Ambulatory Visit (HOSPITAL_BASED_OUTPATIENT_CLINIC_OR_DEPARTMENT_OTHER): Payer: Medicare Other

## 2017-08-11 VITALS — BP 146/83 | HR 85 | Temp 97.7°F | Resp 20 | Ht 60.0 in | Wt 232.6 lb

## 2017-08-11 DIAGNOSIS — E039 Hypothyroidism, unspecified: Secondary | ICD-10-CM | POA: Insufficient documentation

## 2017-08-11 DIAGNOSIS — Z923 Personal history of irradiation: Secondary | ICD-10-CM | POA: Diagnosis not present

## 2017-08-11 DIAGNOSIS — Z08 Encounter for follow-up examination after completed treatment for malignant neoplasm: Secondary | ICD-10-CM | POA: Diagnosis not present

## 2017-08-11 DIAGNOSIS — G8929 Other chronic pain: Secondary | ICD-10-CM | POA: Insufficient documentation

## 2017-08-11 DIAGNOSIS — Z6841 Body Mass Index (BMI) 40.0 and over, adult: Secondary | ICD-10-CM | POA: Insufficient documentation

## 2017-08-11 DIAGNOSIS — C541 Malignant neoplasm of endometrium: Secondary | ICD-10-CM

## 2017-08-11 DIAGNOSIS — I10 Essential (primary) hypertension: Secondary | ICD-10-CM | POA: Insufficient documentation

## 2017-08-11 DIAGNOSIS — Z8542 Personal history of malignant neoplasm of other parts of uterus: Secondary | ICD-10-CM | POA: Diagnosis not present

## 2017-08-11 DIAGNOSIS — K219 Gastro-esophageal reflux disease without esophagitis: Secondary | ICD-10-CM | POA: Diagnosis not present

## 2017-08-11 DIAGNOSIS — Z9221 Personal history of antineoplastic chemotherapy: Secondary | ICD-10-CM | POA: Insufficient documentation

## 2017-08-11 DIAGNOSIS — Z79899 Other long term (current) drug therapy: Secondary | ICD-10-CM | POA: Insufficient documentation

## 2017-08-11 DIAGNOSIS — Z90710 Acquired absence of both cervix and uterus: Secondary | ICD-10-CM | POA: Diagnosis not present

## 2017-08-11 DIAGNOSIS — K5732 Diverticulitis of large intestine without perforation or abscess without bleeding: Secondary | ICD-10-CM

## 2017-08-11 DIAGNOSIS — F329 Major depressive disorder, single episode, unspecified: Secondary | ICD-10-CM | POA: Diagnosis not present

## 2017-08-11 DIAGNOSIS — Z452 Encounter for adjustment and management of vascular access device: Secondary | ICD-10-CM

## 2017-08-11 DIAGNOSIS — M419 Scoliosis, unspecified: Secondary | ICD-10-CM | POA: Insufficient documentation

## 2017-08-11 DIAGNOSIS — N739 Female pelvic inflammatory disease, unspecified: Secondary | ICD-10-CM

## 2017-08-11 DIAGNOSIS — G4733 Obstructive sleep apnea (adult) (pediatric): Secondary | ICD-10-CM | POA: Insufficient documentation

## 2017-08-11 DIAGNOSIS — E119 Type 2 diabetes mellitus without complications: Secondary | ICD-10-CM | POA: Insufficient documentation

## 2017-08-11 DIAGNOSIS — M1990 Unspecified osteoarthritis, unspecified site: Secondary | ICD-10-CM | POA: Insufficient documentation

## 2017-08-11 MED ORDER — SODIUM CHLORIDE 0.9% FLUSH
10.0000 mL | Freq: Once | INTRAVENOUS | Status: AC
  Administered 2017-08-11: 10 mL
  Filled 2017-08-11: qty 10

## 2017-08-11 MED ORDER — HEPARIN SOD (PORK) LOCK FLUSH 100 UNIT/ML IV SOLN
500.0000 [IU] | Freq: Once | INTRAVENOUS | Status: AC
  Administered 2017-08-11: 500 [IU]
  Filled 2017-08-11: qty 5

## 2017-08-11 NOTE — Patient Instructions (Signed)
Plan on having a CT scan in Jan 2019 and follow up with Dr. Denman George in April 2019.  We will contact you with the results of your CT scan in January.  Please call for any questions, concerns, or new symptoms.

## 2017-08-11 NOTE — Progress Notes (Signed)
Follow-up Note: Gyn-Onc  Consult was initially requested by Dr. Benjie Karvonen for the evaluation of Melinda Kerr 82 y.o. female  CC:  Chief Complaint  Patient presents with  . Malignant neoplasm of endometrium [facility])    Assessment/Plan:  Melinda Kerr  is a 82 y.o.  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  Hx of diverticular abscesses in October 2018.  Continue 3 monthly surveillance until February, 2020.  I will see her back in April, 2019. She will see Dr Sondra Come in Jan, 2019.  HPI: Melinda Kerr is a 82 year old G0 who is seen in consultation of Dr Benjie Karvonen for serous endometrial cancer in the setting of morbid obesity (BMI 44). The patient reports a history of postmenopausal bleeding on 01/28/16. She then saw Dr Benjie Karvonen who performed a TVUS on 02/13/16 which revealed 6.2 x 5.3 x 4cm and a thickened endometrial stripe. There was an echogenic mass measuring 22.8cm felt to represent a dermoid cyst on the left ovary. The right ovary was normal. A D&C was performed on 03/04/16 and showed high grade serous carcinoma.   She has morbid obesity with a BMI of 44kg/m2. She has HTN, and a remote history of a spontaneous DVT treated with 6 months of coumadin. She has chronic back pain and scoliosis. The patient's surgical history is most notable for a laparoscopic cholecystectomy and an umbilical hernia repair without mesh in 2007.  Prior to surgery she underwent CT staging with a CT abdo/pelvis and chest. This showed some suspicious/borderline appearing nodes in her pelvic lymph nodes and at the left PA region (1.1cm in largest dimension).   On 03/26/16 she underwent robotic assisted total hysterectomy, BSO and SLN  biopsy. Intraoperative findings were significant for a clinically suspicious left obturator SLN. Final pathology confirmed a stage IIIC1 high grade serous carcinoma of the endometrium with a 3.8cm tumor invading 1.5 of 1.8cm of myometrial thickness with LVSI present. The ovaries and cervix were free of tumor, however the left obturator SLN contained a macrometastatic (>6mm) focus of disease.   She completed 6 cycles of chemotherapy (carboplatin and paclitaxel) between 05-04-16 thru 08-10-16. Treatment was complicated by bone marrow suppression, bone pains.  CT scan on 09/08/16 showed no gross metastases or recurrence.   Interval Hx: She completed vaginal brachytherapy with Dr Sondra Come with in February, 2018.   On 07/13/17 she was admitted to [facility] with diverticulitis and diverticular abscesses seen on CT periappendiceal and on the left pelvis. There was no apparent metastatic recurrent disease. She was treated with antibiotics and has done well. She has no complaints other than fatigue.  Current Meds:  Outpatient Encounter Medications as of 08/11/2017  Medication Sig  . acetaminophen (TYLENOL) 500 MG tablet Take 1,000 mg by mouth 2 (two) times daily.   Marland Kitchen allopurinol (ZYLOPRIM) 300 MG tablet Take 300 mg by mouth daily with breakfast.   . amoxicillin-clavulanate (AUGMENTIN) 875-125 MG tablet Take 1 tablet by mouth every 12 (twelve) hours. For 3 weeks  . calcium gluconate 500 MG tablet Takes 1 tablet in am and 2 in the evening.  . cetirizine (ZYRTEC) 10 MG tablet Take 10 mg by mouth daily.  . citalopram (CELEXA) 20 MG tablet Take 20 mg by mouth daily.   Marland Kitchen  Cyanocobalamin (VITAMIN B-12 PO) Take by mouth.  . Ferrous Sulfate 90 (18 Fe) MG TABS Take 1 tablet by mouth daily.  . furosemide (LASIX) 20 MG tablet Take 20 mg by mouth daily.  Marland Kitchen HAWTHORNE BERRY PO Take 500 mg by mouth 2 (two) times daily.  . hydrOXYzine (ATARAX/VISTARIL) 25 MG tablet Take 25 mg by mouth at bedtime.  Marland Kitchen ketorolac (ACULAR)  0.5 % ophthalmic solution Place 1 drop into the left eye 4 (four) times daily.  Marland Kitchen levothyroxine (SYNTHROID, LEVOTHROID) 125 MCG tablet Take 125 mcg by mouth daily before breakfast.  . lidocaine-prilocaine (EMLA) cream Apply 1-2 hours prior to [facility] access as directed  . lisinopril (PRINIVIL,ZESTRIL) 20 MG tablet Take 20 mg by mouth daily with breakfast.   . moxifloxacin (VIGAMOX) 0.5 % ophthalmic solution Place 1 drop into the left eye 4 (four) times daily.  . Multiple Vitamins-Minerals (MULTIVITAMIN WITH MINERALS) tablet Take 1 tablet by mouth daily.  . mupirocin ointment (BACTROBAN) 2 % Apply 1 application topically 2 (two) times daily as needed.  . Omega-3 Fatty Acids (FISH OIL PO) Take 1 tablet by mouth daily.  Marland Kitchen omeprazole (PRILOSEC OTC) 20 MG tablet Take 20 mg by mouth daily.  . polyethylene glycol (MIRALAX / GLYCOLAX) packet Take 17 g by mouth daily as needed for mild constipation or moderate constipation.  . prednisoLONE acetate (PRED FORTE) 1 % ophthalmic suspension 1 drop 4 (four) times daily.  Marland Kitchen pyridOXINE (VITAMIN B-6) 100 MG tablet Take 100 mg by mouth daily.  Marland Kitchen tolterodine (DETROL LA) 4 MG 24 hr capsule Take 4 mg by mouth daily.  . traMADol (ULTRAM) 50 MG tablet Take 1-2 tablets (50-100 mg total) by mouth every 6 (six) hours as needed (mild pain).  . vitamin C (ASCORBIC ACID) 500 MG tablet Take 500 mg by mouth daily.   No facility-administered encounter medications on file as of 08/11/2017.     Allergy:  Allergies  Allergen Reactions  . Oxybutynin     Bleeding gums, felt sick    Social Hx:   Social History   Socioeconomic History  . Marital status: Widowed    Spouse name: Not on file  . Number of children: 0  . Years of education: Not on file  . Highest education level: Not on file  Social Needs  . Financial resource strain: Not on file  . Food insecurity - worry: Not on file  . Food insecurity - inability: Not on file  . Transportation needs - medical: Not  on file  . Transportation needs - non-medical: Not on file  Occupational History  . 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 Smoking status: Never Smoker  . Smokeless tobacco: Never Used  Substance and Sexual Activity  . Alcohol use: No  . Drug use: No  . Sexual activity: No  Other Topics Concern  . Not on file  Social History Narrative  . Not on file    Past Surgical Hx:  Past Surgical History:  Procedure Laterality Date  . APPENDECTOMY    . CATARACT EXTRACTION, BILATERAL Bilateral 2018  . HERNIA REPAIR    . IR GENERIC HISTORICAL  05/15/2016   IR US GUIDE VASC ACCESS RIGHT 05/15/2016 Aletta Edouard, MD [facility]-INTER[facility] RAD  . IR GENERIC HISTORICAL  05/15/2016   IR FLUORO GUIDE CV LINE RIGHT 05/15/2016 Aletta Edouard, MD [facility]INTER[facility]  . JOINT REPLACEMENT    . LAPAROSCOPIC CHOLECYSTECTOMY  2004  . LAPAROSCOPIC INCISIONAL / UMBILICAL / VENTRAL HERNIA REPAIR  2004   "w/gallbladder OR"  . TUBAL LIGATION  1982  . VARICOSE VEIN SURGERY Bilateral 1968  . WISDOM TOOTH EXTRACTION  1963    Past Medical Hx:  Past Medical History:  Diagnosis Date  . Arthritis    "shoulders, knees, lower back" (07/14/2017)  . CHF (congestive heart failure) ([place])    "while in hospital w/hysterectomy in 2017"  . Chronic lower back pain   . Depression   . Diverticulitis of large intestine with abscess 07/13/2017  . DVT (deep venous thrombosis) ([place])   . Esophageal reflux   . Gout   . Hyperlipidemia    under control  . Hypertension   . Hypothyroidism   . Morbid obesity with BMI of 40.0-44.9, adult ([place])   . Nocturia   . OSA on CPAP    "not wearing it when I sleep in my lift chair" (07/14/2017)  . Osteoarthritis   . Phlebitis    hx of  . Pneumonia ?1989  . Spondylosis    with scoliosis  . Type II diabetes mellitus (HCC)    borderline , diet controlled   . Urinary frequency   . Uterine cancer (HCC)    S/P chemo, radiation, hysterectomy    Past Gynecological History:  G0  No LMP  recorded. Patient has had a hysterectomy.  Family Hx:  Family History  Problem Relation Age of Onset  . Heart disease Mother   . Heart disease Father   . CVA Father     Review of Systems:  Constitutional  Feels well,  + fatigue  ENT Normal appearing ears and nares bilaterally Skin/Breast  No rash, sores, jaundice, itching, dryness Cardiovascular  No chest pain, shortness of breath, or edema  Pulmonary  No cough or wheeze.  Gastro Intestinal  No nausea, vomitting, or diarrhoea. No bright red blood per rectum, no abdominal pain, change in bowel movement, or constipation.  Genito Urinary  No frequency, urgency, dysuria, no bleeding Musculo Skeletal  No myalgia, arthralgia, joint swelling or pain  Neurologic  No weakness, numbness, change in gait,  Psychology  No depression, anxiety, insomnia.   Vitals:  Blood pressure (!) 146/83, pulse 85, temperature 97.7 F (36.5 C), resp. rate 20, height 5' (1.524 m), weight 232 lb 9.6 oz (105.5 kg), SpO2 97 %.  Physical Exam: WD in NAD Neck  Supple NROM, without any enlargements.  Lymph Node Survey No cervical supraclavicular or inguinal adenopathy Cardiovascular  Pulse normal rate, regularity and rhythm. S1 and S2 normal.  Lungs  Clear to auscultation bilateraly. + audible wheezing on exertion. Increased work of breathing when positioning on bed. Good air movement.  Skin  No rash/lesions/breakdown  Psychiatry  Alert and oriented to person, place, and time  Abdomen  Normoactive bowel sounds, abdomen soft, non-tender and obese with pannus folds without evidence of hernia. Well healed incision sites. Back No CVA tenderness Genito Urinary  Vulva/vagina: Normal external female genitalia.  No lesions. No discharge or bleeding.  Bladder/urethra:  No lesions or masses, well supported bladder  Surgically absent cervix and uterus with no masses or lesions.  Vaginal cuff with no lesions  No pelvic masses Rectal   deferred Extremities  No bilateral cyanosis, clubbing or edema.   Donaciano Eva, MD  08/11/2017, 2:46 PM

## 2017-08-12 DIAGNOSIS — D229 Melanocytic nevi, unspecified: Secondary | ICD-10-CM | POA: Diagnosis not present

## 2017-08-12 DIAGNOSIS — C44622 Squamous cell carcinoma of skin of right upper limb, including shoulder: Secondary | ICD-10-CM | POA: Diagnosis not present

## 2017-08-12 DIAGNOSIS — D485 Neoplasm of uncertain behavior of skin: Secondary | ICD-10-CM | POA: Diagnosis not present

## 2017-08-12 DIAGNOSIS — D1801 Hemangioma of skin and subcutaneous tissue: Secondary | ICD-10-CM | POA: Diagnosis not present

## 2017-08-12 DIAGNOSIS — L821 Other seborrheic keratosis: Secondary | ICD-10-CM | POA: Diagnosis not present

## 2017-08-16 ENCOUNTER — Encounter: Payer: Self-pay | Admitting: Podiatry

## 2017-08-16 ENCOUNTER — Ambulatory Visit (INDEPENDENT_AMBULATORY_CARE_PROVIDER_SITE_OTHER): Payer: Medicare Other | Admitting: Podiatry

## 2017-08-16 DIAGNOSIS — B351 Tinea unguium: Secondary | ICD-10-CM | POA: Diagnosis not present

## 2017-08-16 DIAGNOSIS — M79676 Pain in unspecified toe(s): Secondary | ICD-10-CM

## 2017-08-16 DIAGNOSIS — L989 Disorder of the skin and subcutaneous tissue, unspecified: Secondary | ICD-10-CM

## 2017-08-22 NOTE — Progress Notes (Signed)
    Subjective: Patient is a 82 y.o. female presenting to the office today with a chief complaint of painful callus lesions to the bilateral feet that have been present for several weeks.  Patient also complains of elongated, thickened nails that cause pain while ambulating in shoes. Patient is unable to trim their own nails. Patient presents today for further treatment and evaluation.  Past Medical History:  Diagnosis Date  . Arthritis    "shoulders, knees, lower back" (07/14/2017)  . CHF (congestive heart failure) ([place])    "while in hospital w/hysterectomy in 2017"  . Chronic lower back pain   . Depression   . Diverticulitis of large intestine with abscess 07/13/2017  . DVT (deep venous thrombosis) ([place])   . Esophageal reflux   . Gout   . Hyperlipidemia    under control  . Hypertension   . Hypothyroidism   . Morbid obesity with BMI of 40.0-44.9, adult ([place])   . Nocturia   . OSA on CPAP    "not wearing it when I sleep in my lift chair" (07/14/2017)  . Osteoarthritis   . Phlebitis    hx of  . Pneumonia ?1989  . Spondylosis    with scoliosis  . Type II diabetes mellitus (HCC)    borderline , diet controlled   . Urinary frequency   . Uterine cancer (HCC)    S/P chemo, radiation, hysterectomy    Objective:  Physical Exam General: Alert and oriented x3 in no acute distress  Dermatology: Hyperkeratotic lesions present on the bilateral feet x 2. Pain on palpation with a central nucleated core noted. Skin is warm, dry and supple bilateral lower extremities. Negative for open lesions or macerations. Nails are tender, long, thickened and dystrophic with subungual debris, consistent with onychomycosis, 1-5 bilateral. No signs of infection noted.  Vascular: Palpable pedal pulses bilaterally. No edema or erythema noted. Capillary refill within normal limits.  Neurological: Epicritic and protective threshold grossly intact bilaterally.   Musculoskeletal Exam: Pain on palpation at  the keratotic lesion noted. Range of motion within normal limits bilateral. Muscle strength 5/5 in all groups bilateral.  Assessment: 1. Onychodystrophic nails 1-5 bilateral with hyperkeratosis of nails.  2. Onychomycosis of nail due to dermatophyte bilateral 3. Pre-ulcerative calluses to the bilateral feet x 2   Plan of Care:  #1 Patient evaluated. #2 Excisional debridement of keratoic lesion using a chisel blade was performed without incident.  #3 Dressed with light dressing. #4 Mechanical debridement of nails 1-5 bilaterally performed using a nail nipper. Filed with dremel without incident.  #5 Patient is to return to the clinic in 3 months.    Edrick Kins, DPM [facility]  Dr. Edrick Kins, [address]                                        [place], [place] [address]                Office [phone]  Fax [phone]

## 2017-08-30 ENCOUNTER — Ambulatory Visit (INDEPENDENT_AMBULATORY_CARE_PROVIDER_SITE_OTHER): Payer: Medicare Other | Admitting: Internal Medicine

## 2017-08-30 ENCOUNTER — Encounter: Payer: Self-pay | Admitting: Internal Medicine

## 2017-08-30 VITALS — Temp 98.5°F | Ht 60.0 in | Wt 237.0 lb

## 2017-08-30 DIAGNOSIS — K5720 Diverticulitis of large intestine with perforation and abscess without bleeding: Secondary | ICD-10-CM | POA: Diagnosis present

## 2017-08-30 NOTE — Progress Notes (Signed)
[facility] for Infectious Disease      Reason for Consult:diverticular abscess    Referring Physician: Dr. Inda Castle    Patient ID: Melinda Kerr, female    DOB: [birth year], 82 y.o.   MRN: [id number]  HPI:   She comes in as a new patient following a recent hospitaliztion for diverticular abscess.  She was admitted 10/16-19 after 5 weeks of abdominal pain and CT scan done by her PCP noted a small fluid collection of the cul-de-sac suspicious for an abscess with some suggestion of diverticulitis.  She was having no fever, WBC just 10.5.  Since discharge, she has had no further abdominal pain, did not every have fever or chills, no difficulty eating.  She is also followed by Dr. Cristina Gong of GI and knows about a good diverticulosis diet.  No associated weight loss.  Normal bowel movements.  Previous record reviewed Epic of her hospitalization including the CT findings and discussion with Dr. Johnnye Sima.    Past Medical History:  Diagnosis Date  . Arthritis    "shoulders, knees, lower back" (07/14/2017)  . CHF (congestive heart failure) ([place])    "while in hospital w/hysterectomy in 2017"  . Chronic lower back pain   . Depression   . Diverticulitis of large intestine with abscess 07/13/2017  . DVT (deep venous thrombosis) ([place])   . Esophageal reflux   . Gout   . Hyperlipidemia    under control  . Hypertension   . Hypothyroidism   . Morbid obesity with BMI of 40.0-44.9, adult ([place])   . Nocturia   . OSA on CPAP    "not wearing it when I sleep in my lift chair" (07/14/2017)  . Osteoarthritis   . Phlebitis    hx of  . Pneumonia ?1989  . Spondylosis    with scoliosis  . Type II diabetes mellitus (HCC)    borderline , diet controlled   . Urinary frequency   . Uterine cancer (HCC)    S/P chemo, radiation, hysterectomy    Prior to Admission medications   Medication Sig Start Date End Date Taking? Authorizing Provider  acetaminophen (TYLENOL) 500 MG tablet Take 1,000 mg by mouth 2  (two) times daily.     [provider]  allopurinol (ZYLOPRIM) 300 MG tablet Take 300 mg by mouth daily with breakfast.     [provider]  amoxicillin-clavulanate (AUGMENTIN) 875-125 MG tablet Take 1 tablet by mouth every 12 (twelve) hours. For 3 weeks 07/16/17   Domenic Polite, MD  calcium gluconate 500 MG tablet Takes 1 tablet in am and 2 in the evening.    [provider]  cetirizine (ZYRTEC) 10 MG tablet Take 10 mg by mouth daily.    [provider]  citalopram (CELEXA) 20 MG tablet Take 20 mg by mouth daily.  01/10/15   [provider]  Cyanocobalamin (VITAMIN B-12 PO) Take by mouth.    [provider]  Ferrous Sulfate 90 (18 Fe) MG TABS Take 1 tablet by mouth daily.    [provider]  furosemide (LASIX) 20 MG tablet Take 20 mg by mouth daily. 01/10/15   [provider]  HAWTHORNE BERRY PO Take 500 mg by mouth 2 (two) times daily.    [provider]  hydrOXYzine (ATARAX/VISTARIL) 25 MG tablet Take 25 mg by mouth at bedtime.    [provider]  ketorolac (ACULAR) 0.5 % ophthalmic solution Place 1 drop into the left eye 4 (four) times daily.  06/17/17   [provider]  levothyroxine (SYNTHROID, LEVOTHROID) 125 MCG tablet Take 125 mcg by mouth daily before breakfast.    [provider]  lidocaine-prilocaine (EMLA) cream Apply 1-2 hours prior to Porta-Cath access as directed 05/12/16   Livesay, Lennis P, MD  lisinopril (PRINIVIL,ZESTRIL) 20 MG tablet Take 20 mg by mouth daily with breakfast.     [provider]  moxifloxacin (VIGAMOX) 0.5 % ophthalmic solution Place 1 drop into the left eye 4 (four) times daily. 06/17/17   [provider]  Multiple Vitamins-Minerals (MULTIVITAMIN WITH MINERALS) tablet Take 1 tablet by mouth daily.    [provider]  mupirocin ointment (BACTROBAN) 2 % Apply 1 application topically 2 (two) times daily as needed. 07/12/17   [provider]  Omega-3 Fatty Acids (FISH OIL PO) Take 1 tablet by mouth daily.    [provider]  omeprazole (PRILOSEC OTC) 20 MG tablet Take 20 mg by mouth daily.    [provider]  polyethylene glycol (MIRALAX / GLYCOLAX) packet Take 17 g by mouth daily as needed for mild constipation or moderate constipation. 04/12/14   Perkins, Alexzandrew L, PA-C  prednisoLONE acetate (PRED FORTE) 1 % ophthalmic suspension 1 drop 4 (four) times daily.    [provider]  pyridOXINE (VITAMIN B-6) 100 MG tablet Take 100 mg by mouth daily.    [provider]  tolterodine (DETROL LA) 4 MG 24 hr capsule Take 4 mg by mouth daily.    [provider]  traMADol (ULTRAM) 50 MG tablet Take 1-2 tablets (50-100 mg total) by mouth every 6 (six) hours as needed (mild pain). 04/12/14   Perkins, Alexzandrew L, PA-C  vitamin C (ASCORBIC ACID) 500 MG tablet Take 500 mg by mouth daily.    [provider]    Allergies  Allergen Reactions  . Oxybutynin     Bleeding gums, felt sick    Social History   Tobacco Use  . Smoking status: Never Smoker  . Smokeless tobacco: Never Used  Substance Use Topics  . Alcohol use: No  . Drug use: No    Family History  Problem Relation Age of Onset  . Heart disease Mother   . Heart disease Father   . CVA Father   brother and sister with divertiuclar disease  Review of Systems  Constitutional: negative for fatigue and malaise Cardiovascular: negative for fatigue Gastrointestinal: negative for diarrhea Integument/breast: negative for rash All other systems reviewed and are negative    Constitutional: in no apparent distress  Vitals:   08/30/17 1419  Weight: 237 lb (107.5 kg)  Height: 5' (1.524 m)   EYES: anicteric ENMT: no thrush Cardiovascular: Cor RRR Respiratory: CTA B; normal respiratory effort GI: Bowel sounds are normal; soft, nt Musculoskeletal: peripheral pulses normal, no pedal edema, no clubbing or  cyanosis Skin: negatives: no rash Hematologic: no cervical lad  Labs: Lab Results  Component Value Date   WBC 7.2 07/16/2017   HGB 10.8 (L) 07/16/2017   HCT 34.5 (L) 07/16/2017   MCV 97.7 07/16/2017   PLT 307 07/16/2017    Lab Results  Component Value Date   CREATININE 0.77 07/15/2017   BUN <5 (L) 07/15/2017   NA 140 07/15/2017   K 4.0 07/15/2017   CL 104 07/15/2017   CO2 29 07/15/2017    Lab Results  Component Value Date   ALT 16 07/13/2017   AST 24 07/13/2017   ALKPHOS 148 (H) 07/13/2017   BILITOT 0.8  07/13/2017   INR 1.05 05/15/2016     Assessment: diverticulitis with small non-drainable abscess, resolved.  She is asymptomatic now, no new concerns and is aware of appropriate foods to avoid and followed by GI. No further antibiotics indicated or follow up radiology.   Plan: 1) rtc PRN

## 2017-09-22 DIAGNOSIS — M810 Age-related osteoporosis without current pathological fracture: Secondary | ICD-10-CM | POA: Diagnosis not present

## 2017-09-22 DIAGNOSIS — Z1231 Encounter for screening mammogram for malignant neoplasm of breast: Secondary | ICD-10-CM | POA: Diagnosis not present

## 2017-09-29 DIAGNOSIS — R921 Mammographic calcification found on diagnostic imaging of breast: Secondary | ICD-10-CM | POA: Diagnosis not present

## 2017-10-04 DIAGNOSIS — E039 Hypothyroidism, unspecified: Secondary | ICD-10-CM | POA: Diagnosis not present

## 2017-10-04 DIAGNOSIS — F419 Anxiety disorder, unspecified: Secondary | ICD-10-CM | POA: Diagnosis not present

## 2017-10-04 DIAGNOSIS — Z0000 Encounter for general adult medical examination without abnormal findings: Secondary | ICD-10-CM | POA: Diagnosis not present

## 2017-10-04 DIAGNOSIS — E7800 Pure hypercholesterolemia, unspecified: Secondary | ICD-10-CM | POA: Diagnosis not present

## 2017-10-04 DIAGNOSIS — E1122 Type 2 diabetes mellitus with diabetic chronic kidney disease: Secondary | ICD-10-CM | POA: Diagnosis not present

## 2017-10-04 DIAGNOSIS — M109 Gout, unspecified: Secondary | ICD-10-CM | POA: Diagnosis not present

## 2017-10-04 DIAGNOSIS — G4700 Insomnia, unspecified: Secondary | ICD-10-CM | POA: Diagnosis not present

## 2017-10-04 DIAGNOSIS — I129 Hypertensive chronic kidney disease with stage 1 through stage 4 chronic kidney disease, or unspecified chronic kidney disease: Secondary | ICD-10-CM | POA: Diagnosis not present

## 2017-10-04 DIAGNOSIS — K219 Gastro-esophageal reflux disease without esophagitis: Secondary | ICD-10-CM | POA: Diagnosis not present

## 2017-10-04 DIAGNOSIS — N183 Chronic kidney disease, stage 3 (moderate): Secondary | ICD-10-CM | POA: Diagnosis not present

## 2017-10-04 DIAGNOSIS — F3341 Major depressive disorder, recurrent, in partial remission: Secondary | ICD-10-CM | POA: Diagnosis not present

## 2017-10-04 DIAGNOSIS — R609 Edema, unspecified: Secondary | ICD-10-CM | POA: Diagnosis not present

## 2017-10-07 ENCOUNTER — Inpatient Hospital Stay: Payer: Medicare Other

## 2017-10-07 ENCOUNTER — Inpatient Hospital Stay: Payer: Medicare Other | Attending: Hematology and Oncology

## 2017-10-07 DIAGNOSIS — C541 Malignant neoplasm of endometrium: Secondary | ICD-10-CM

## 2017-10-07 DIAGNOSIS — C55 Malignant neoplasm of uterus, part unspecified: Secondary | ICD-10-CM | POA: Insufficient documentation

## 2017-10-07 LAB — CBC WITH DIFFERENTIAL/PLATELET
Basophils Absolute: 0 10*3/uL (ref 0.0–0.1)
Basophils Relative: 0 %
EOS PCT: 3 %
Eosinophils Absolute: 0.2 10*3/uL (ref 0.0–0.5)
HCT: 38.2 % (ref 34.8–46.6)
Hemoglobin: 12.2 g/dL (ref 11.6–15.9)
LYMPHS PCT: 24 %
Lymphs Abs: 1.5 10*3/uL (ref 0.9–3.3)
MCH: 31.6 pg (ref 25.1–34.0)
MCHC: 31.9 g/dL (ref 31.5–36.0)
MCV: 99 fL (ref 79.5–101.0)
MONO ABS: 0.5 10*3/uL (ref 0.1–0.9)
MONOS PCT: 9 %
Neutro Abs: 3.8 10*3/uL (ref 1.5–6.5)
Neutrophils Relative %: 64 %
PLATELETS: 214 10*3/uL (ref 145–400)
RBC: 3.86 MIL/uL (ref 3.70–5.45)
RDW: 13.8 % (ref 11.2–16.1)
WBC: 6 10*3/uL (ref 3.9–10.3)

## 2017-10-07 LAB — COMPREHENSIVE METABOLIC PANEL
ALBUMIN: 3.3 g/dL — AB (ref 3.5–5.0)
ALT: 16 U/L (ref 0–55)
AST: 20 U/L (ref 5–34)
Alkaline Phosphatase: 83 U/L (ref 40–150)
Anion gap: 8 (ref 3–11)
BUN: 23 mg/dL (ref 7–26)
CHLORIDE: 104 mmol/L (ref 98–109)
CO2: 27 mmol/L (ref 22–29)
Calcium: 9.6 mg/dL (ref 8.4–10.4)
Creatinine, Ser: 0.88 mg/dL (ref 0.60–1.10)
GFR calc Af Amer: >60 mL/min (ref >60)
GFR calc non Af Amer: >60 mL/min (ref >60)
GLUCOSE: 139 mg/dL (ref 70–140)
POTASSIUM: 4.5 mmol/L (ref 3.3–4.7)
Sodium: 139 mmol/L (ref 136–145)
Total Bilirubin: 0.4 mg/dL (ref 0.2–1.2)
Total Protein: 6.4 g/dL (ref 6.4–8.3)

## 2017-10-07 MED ORDER — SODIUM CHLORIDE 0.9% FLUSH
10.0000 mL | Freq: Once | INTRAVENOUS | Status: AC
  Administered 2017-10-07: 10 mL
  Filled 2017-10-07: qty 10

## 2017-10-07 MED ORDER — HEPARIN SOD (PORK) LOCK FLUSH 100 UNIT/ML IV SOLN
250.0000 [IU] | Freq: Once | INTRAVENOUS | Status: AC
  Administered 2017-10-07: 250 [IU]
  Filled 2017-10-07: qty 5

## 2017-10-12 DIAGNOSIS — Z85828 Personal history of other malignant neoplasm of skin: Secondary | ICD-10-CM | POA: Diagnosis not present

## 2017-10-12 DIAGNOSIS — L905 Scar conditions and fibrosis of skin: Secondary | ICD-10-CM | POA: Diagnosis not present

## 2017-10-13 DIAGNOSIS — R195 Other fecal abnormalities: Secondary | ICD-10-CM | POA: Diagnosis not present

## 2017-10-13 DIAGNOSIS — K5901 Slow transit constipation: Secondary | ICD-10-CM | POA: Diagnosis not present

## 2017-10-13 DIAGNOSIS — G4733 Obstructive sleep apnea (adult) (pediatric): Secondary | ICD-10-CM | POA: Diagnosis not present

## 2017-10-13 DIAGNOSIS — Z8541 Personal history of malignant neoplasm of cervix uteri: Secondary | ICD-10-CM | POA: Diagnosis not present

## 2017-10-13 DIAGNOSIS — R935 Abnormal findings on diagnostic imaging of other abdominal regions, including retroperitoneum: Secondary | ICD-10-CM | POA: Diagnosis not present

## 2017-10-21 ENCOUNTER — Ambulatory Visit (HOSPITAL_COMMUNITY)
Admission: RE | Admit: 2017-10-21 | Discharge: 2017-10-21 | Disposition: A | Discharge status: Home / Self Care | Payer: Medicare Other | Source: Ambulatory Visit | Attending: Gynecologic Oncology | Admitting: Gynecologic Oncology

## 2017-10-21 DIAGNOSIS — R112 Nausea with vomiting, unspecified: Secondary | ICD-10-CM | POA: Diagnosis not present

## 2017-10-21 DIAGNOSIS — K5732 Diverticulitis of large intestine without perforation or abscess without bleeding: Secondary | ICD-10-CM | POA: Diagnosis not present

## 2017-10-21 DIAGNOSIS — K449 Diaphragmatic hernia without obstruction or gangrene: Secondary | ICD-10-CM | POA: Diagnosis not present

## 2017-10-21 DIAGNOSIS — I2510 Atherosclerotic heart disease of native coronary artery without angina pectoris: Secondary | ICD-10-CM | POA: Diagnosis not present

## 2017-10-21 DIAGNOSIS — I700 Atherosclerosis of aorta: Secondary | ICD-10-CM | POA: Insufficient documentation

## 2017-10-21 DIAGNOSIS — K381 Appendicular concretions: Secondary | ICD-10-CM | POA: Diagnosis not present

## 2017-10-21 DIAGNOSIS — R1110 Vomiting, unspecified: Secondary | ICD-10-CM | POA: Diagnosis not present

## 2017-10-21 DIAGNOSIS — K5730 Diverticulosis of large intestine without perforation or abscess without bleeding: Secondary | ICD-10-CM | POA: Insufficient documentation

## 2017-10-21 MED ORDER — IOPAMIDOL (ISOVUE-300) INJECTION 61%
INTRAVENOUS | Status: AC
  Filled 2017-10-21: qty 100

## 2017-10-21 MED ORDER — IOPAMIDOL (ISOVUE-300) INJECTION 61%
100.0000 mL | Freq: Once | INTRAVENOUS | Status: AC | PRN
  Administered 2017-10-21: 100 mL via INTRAVENOUS

## 2017-10-26 ENCOUNTER — Telehealth: Payer: Self-pay

## 2017-10-26 NOTE — Telephone Encounter (Signed)
Told Melinda Kerr that the CT Scan showed no cancer. She does have a large hiatal hernia that may be contributing to the nausea and vomiting that she has recently experienced per Melinda John, Melinda Kerr. A copy of the ct scan will be sent to her PCP Melinda Kerr.  She can call his office  to discuss if  further evaluation is needed. Pt verbalized understanding.

## 2017-10-28 ENCOUNTER — Ambulatory Visit
Admission: RE | Admit: 2017-10-28 | Discharge: 2017-10-28 | Disposition: A | Discharge status: Home / Self Care | Payer: Medicare Other | Source: Ambulatory Visit | Attending: Radiation Oncology | Admitting: Radiation Oncology

## 2017-10-28 ENCOUNTER — Encounter: Payer: Self-pay | Admitting: Radiation Oncology

## 2017-10-28 ENCOUNTER — Other Ambulatory Visit: Payer: Self-pay

## 2017-10-28 VITALS — BP 112/66 | HR 80 | Temp 98.1°F | Ht 60.0 in | Wt 240.6 lb

## 2017-10-28 DIAGNOSIS — Z90710 Acquired absence of both cervix and uterus: Secondary | ICD-10-CM | POA: Insufficient documentation

## 2017-10-28 DIAGNOSIS — Z8542 Personal history of malignant neoplasm of other parts of uterus: Secondary | ICD-10-CM | POA: Diagnosis not present

## 2017-10-28 DIAGNOSIS — Z79899 Other long term (current) drug therapy: Secondary | ICD-10-CM | POA: Diagnosis not present

## 2017-10-28 DIAGNOSIS — Z08 Encounter for follow-up examination after completed treatment for malignant neoplasm: Secondary | ICD-10-CM | POA: Insufficient documentation

## 2017-10-28 DIAGNOSIS — C541 Malignant neoplasm of endometrium: Secondary | ICD-10-CM

## 2017-10-28 DIAGNOSIS — R32 Unspecified urinary incontinence: Secondary | ICD-10-CM | POA: Diagnosis not present

## 2017-10-28 DIAGNOSIS — R350 Frequency of micturition: Secondary | ICD-10-CM | POA: Diagnosis not present

## 2017-10-28 NOTE — Progress Notes (Signed)
Radiation Oncology         [phone]) [phone] ________________________________  Name: Melinda Kerr MRN: [id number]  Date: 10/28/2017  DOB: 1935/06/01  Follow-Up Visit Note  CC: Maury Dus, MD  Gordy Levan, MD    ICD-10-CM   1. Malignant neoplasm of endometrium [facility]) C54.1   2. 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 She reports urinary frequency and incontinence continues. She notes this has occurred for the past 4 years but increasing in severity. She takes Detral LA. She also notes hospitalization for diverticulitis in October. She denies pain, bowel issues, vaginal bleeding or discharge. She continues to use her vaginal dilator. She was last seen by Dr. Denman George on 08/11/17 and per her note, no evidence of recurrence on exam. She underwent CT of the abdomen and pelvis on 10/21/17 for persistent pain in the lower abdomen and some nausea. CT showed no acute findings noted in the abdomen or pelvis to account for symptoms.   ALLERGIES:  is allergic to oxybutynin.  Meds: Current Outpatient Medications  Medication Sig Dispense Refill  . acetaminophen (TYLENOL) 500 MG tablet Take 1,000 mg by mouth 2 (two) times daily.     Marland Kitchen alendronate (FOSAMAX) 70 MG tablet Take 70 mg by mouth once a week. Take with a full glass of water on an empty stomach.    Marland Kitchen allopurinol (ZYLOPRIM) 300 MG tablet Take 300 mg by mouth daily with breakfast.     . calcium gluconate 500 MG tablet Takes 1 tablet in am and 2 in the evening.    . cetirizine (ZYRTEC) 10 MG tablet Take 10 mg by mouth daily.    . citalopram (CELEXA) 20 MG tablet Take 20 mg by mouth daily.     . Cyanocobalamin (VITAMIN B-12 PO) Take by mouth.    . Ferrous Sulfate 90 (18 Fe) MG TABS Take 1 tablet by mouth  daily.    . furosemide (LASIX) 20 MG tablet Take 20 mg by mouth daily.    Marland Kitchen HAWTHORNE BERRY PO Take 500 mg by mouth 2 (two) times daily.    . hydrOXYzine (ATARAX/VISTARIL) 25 MG tablet Take 25 mg by mouth at bedtime.    Marland Kitchen ketorolac (ACULAR) 0.5 % ophthalmic solution Place 1 drop into the left eye 4 (four) times daily.    Marland Kitchen levothyroxine (SYNTHROID, LEVOTHROID) 125 MCG tablet Take 125 mcg by mouth daily before breakfast.    . lidocaine-prilocaine (EMLA) cream Apply 1-2 hours prior to [facility] access as directed 30 g 1  . lisinopril (PRINIVIL,ZESTRIL) 20 MG tablet Take 20 mg by mouth daily with breakfast.     . Melatonin 2.5 MG CAPS Take by mouth.    . Multiple Vitamins-Minerals (MULTIVITAMIN WITH MINERALS) tablet Take 1 tablet by mouth daily.    . Omega-3 Fatty Acids (FISH OIL PO) Take 1 tablet by mouth daily.    Marland Kitchen omeprazole (PRILOSEC OTC) 20 MG tablet Take 20 mg by mouth daily.    . polyethylene glycol (MIRALAX / GLYCOLAX) packet Take 17 g by mouth daily as needed for mild constipation or moderate constipation. 14 each 0  . pyridOXINE (VITAMIN B-6) 100 MG tablet Take 100 mg by mouth daily.    Marland Kitchen tolterodine (DETROL LA) 4 MG 24 hr capsule Take  4 mg by mouth daily.    . traMADol (ULTRAM) 50 MG tablet Take 1-2 tablets (50-100 mg total) by mouth every 6 (six) hours as needed (mild pain). 80 tablet 1  . vitamin C (ASCORBIC ACID) 500 MG tablet Take 500 mg by mouth daily.     No current facility-administered medications for this encounter.     Physical Findings: The patient is in no acute distress. Patient is alert and oriented.  height is 5' (1.524 m) and weight is 240 lb 9.6 oz (109.1 kg). Her oral temperature is 98.1 F (36.7 C). Her blood pressure is 112/66 and her pulse is 80. Her oxygen saturation is 97%. .   Lungs are clear to auscultation bilaterally. Heart has regular rate and rhythm. No palpable cervical, supraclavicular, or axillary adenopathy. Abdomen soft, non-tender, normal bowel  sounds.   On pelvic examination the external genitalia were unremarkable. A speculum exam was performed. There are no mucosal lesions noted in the vaginal vault. On bimanual and rectovaginal examination there were no pelvic masses appreciated.    Lab Findings: Lab Results  Component Value Date   WBC 6.0 10/07/2017   HGB 12.2 10/07/2017   HCT 38.2 10/07/2017   MCV 99.0 10/07/2017   PLT 214 10/07/2017    Radiographic Findings: Ct Abdomen Pelvis W Contrast  Result Date: 10/22/2017 CLINICAL DATA:  83 year old female recently diagnosed with diverticulitis in September 2018, with persistent pain in the lower abdomen and some nausea and vomiting at times. EXAM: CT ABDOMEN AND PELVIS WITH CONTRAST TECHNIQUE: Multidetector CT imaging of the abdomen and pelvis was performed using the standard protocol following bolus administration of intravenous contrast. CONTRAST:  143mL ISOVUE-300 IOPAMIDOL (ISOVUE-300) INJECTION 61% COMPARISON:  None. FINDINGS: Lower chest: Large hiatal hernia. Mild scarring in the lung bases bilaterally. Atherosclerotic calcifications in the left main, left anterior descending, left circumflex and right coronary arteries. Severe calcification of the mitral valve and mitral annulus. Central venous catheter terminating in the right atrium. Hepatobiliary: No cystic or solid hepatic lesions. No intra or extrahepatic biliary ductal dilatation. Status post cholecystectomy. Pancreas: No pancreatic mass. No pancreatic ductal dilatation. No pancreatic or peripancreatic fluid or inflammatory changes. Spleen: Unremarkable. Adrenals/Urinary Tract: Bilateral kidneys and bilateral adrenal glands are normal in appearance. No hydroureteronephrosis. Urinary bladder is normal in appearance. Stomach/Bowel: Intraabdominal portion of the stomach is unremarkable. No pathologic dilatation of small bowel or colon. A few scattered colonic diverticulae are noted, without surrounding inflammatory changes to  suggest an acute diverticulitis at this time. Appendicolith noted in the neck of the appendix, without surrounding inflammatory changes to suggest an acute appendicitis at this time. Vascular/Lymphatic: Aortic atherosclerosis, without evidence of aneurysm or dissection in the abdominal or pelvic vasculature. No lymphadenopathy noted in the abdomen or pelvis. Reproductive: Status post hysterectomy. Ovaries are not confidently identified may be surgically absent or atrophic. Other: No significant volume of ascites.  No pneumoperitoneum. Musculoskeletal: There are no aggressive appearing lytic or blastic lesions noted in the visualized portions of the skeleton. IMPRESSION: 1. No acute findings are noted in the abdomen or pelvis to account for the patient's symptoms. 2. Colonic diverticulosis without evidence of recurrent diverticulitis. 3. Appendicoliths in the neck of the appendix. No findings to suggest an acute appendicitis at this time. 4. Aortic atherosclerosis, in addition to left main and 3 vessel coronary artery disease. 5. Large hiatal hernia. 6. Additional incidental findings, as above. Aortic Atherosclerosis (ICD10-I70.0). Electronically Signed   By: Vinnie Langton M.D.   On: 10/22/2017  08:11    Impression:   No evidence of recurrence on clinical exam. I discussed with the patient the option of a physical therapy referral for urinary incontinence. She denied the referral at this time.   Plan: She is scheduled to follow up with Dr. Denman George in April. She will return for follow up in radiation oncology in July.   -----------------------------------  Blair Promise, PhD, MD  This document serves as a record of services personally performed by Gery Pray, MD. It was created on his behalf by Bethann Humble, a trained medical scribe. The creation of this record is based on the scribe's personal observations and the provider's statements to them. This document has been checked and approved by the  attending provider.

## 2017-10-28 NOTE — Progress Notes (Signed)
Melinda Kerr is here for follow up.  She denies having pain.  She continues to report having urinary frequency and incontinence.  She said she has had this for 4 years but it has gotten worse.  She does take Detral LA.  She said she was hospitalized for diverticulitis in October.  She denies having any bowel issues now.  She denies having any vaginal bleeding or discharge.  She is using a vaginal dilator.  BP 112/66 (BP Location: Right Wrist, Patient Position: Sitting)   Pulse 80   Temp 98.1 F (36.7 C) (Oral)   Ht 5' (1.524 m)   Wt 240 lb 9.6 oz (109.1 kg)   SpO2 97%   BMI 46.99 kg/m   . Wt Readings from Last 3 Encounters:  10/28/17 240 lb 9.6 oz (109.1 kg)  08/30/17 237 lb (107.5 kg)  08/11/17 232 lb 9.6 oz (105.5 kg)

## 2017-11-01 DIAGNOSIS — R195 Other fecal abnormalities: Secondary | ICD-10-CM | POA: Diagnosis not present

## 2017-11-11 DIAGNOSIS — H18421 Band keratopathy, right eye: Secondary | ICD-10-CM | POA: Diagnosis not present

## 2017-11-12 DIAGNOSIS — K5901 Slow transit constipation: Secondary | ICD-10-CM | POA: Diagnosis not present

## 2017-11-12 DIAGNOSIS — R935 Abnormal findings on diagnostic imaging of other abdominal regions, including retroperitoneum: Secondary | ICD-10-CM | POA: Diagnosis not present

## 2017-11-12 DIAGNOSIS — Z8541 Personal history of malignant neoplasm of cervix uteri: Secondary | ICD-10-CM | POA: Diagnosis not present

## 2017-11-12 DIAGNOSIS — R195 Other fecal abnormalities: Secondary | ICD-10-CM | POA: Diagnosis not present

## 2017-11-17 ENCOUNTER — Ambulatory Visit (INDEPENDENT_AMBULATORY_CARE_PROVIDER_SITE_OTHER): Payer: Medicare Other | Admitting: Podiatry

## 2017-11-17 DIAGNOSIS — M79676 Pain in unspecified toe(s): Secondary | ICD-10-CM | POA: Diagnosis not present

## 2017-11-17 DIAGNOSIS — B351 Tinea unguium: Secondary | ICD-10-CM

## 2017-11-17 DIAGNOSIS — L989 Disorder of the skin and subcutaneous tissue, unspecified: Secondary | ICD-10-CM | POA: Diagnosis not present

## 2017-11-21 NOTE — Progress Notes (Signed)
    Subjective: Patient is a 83 y.o. female presenting to the office today with a chief complaint of painful callus lesions to the bilateral feet that have been present for several months.  Patient also complains of elongated, thickened nails that cause pain while ambulating in shoes. Patient is unable to trim their own nails. Patient presents today for further treatment and evaluation.  Past Medical History:  Diagnosis Date  . Arthritis    "shoulders, knees, lower back" (07/14/2017)  . CHF (congestive heart failure) ([place])    "while in hospital w/hysterectomy in 2017"  . Chronic lower back pain   . Depression   . Diverticulitis of large intestine with abscess 07/13/2017  . DVT (deep venous thrombosis) ([place])   . Esophageal reflux   . Gout   . Hyperlipidemia    under control  . Hypertension   . Hypothyroidism   . Morbid obesity with BMI of 40.0-44.9, adult ([place])   . Nocturia   . OSA on CPAP    "not wearing it when I sleep in my lift chair" (07/14/2017)  . Osteoarthritis   . Phlebitis    hx of  . Pneumonia ?1989  . Spondylosis    with scoliosis  . Type II diabetes mellitus (HCC)    borderline , diet controlled   . Urinary frequency   . Uterine cancer (HCC)    S/P chemo, radiation, hysterectomy    Objective:  Physical Exam General: Alert and oriented x3 in no acute distress  Dermatology: Hyperkeratotic lesions present on the bilateral feet x 2. Pain on palpation with a central nucleated core noted. Skin is warm, dry and supple bilateral lower extremities. Negative for open lesions or macerations. Nails are tender, long, thickened and dystrophic with subungual debris, consistent with onychomycosis, 1-5 bilateral. No signs of infection noted.  Vascular: Palpable pedal pulses bilaterally. No edema or erythema noted. Capillary refill within normal limits.  Neurological: Epicritic and protective threshold grossly intact bilaterally.   Musculoskeletal Exam: Pain on palpation at  the keratotic lesion noted. Range of motion within normal limits bilateral. Muscle strength 5/5 in all groups bilateral.  Assessment: 1. Onychodystrophic nails 1-5 bilateral with hyperkeratosis of nails.  2. Onychomycosis of nail due to dermatophyte bilateral 3. Pre-ulcerative calluses to the bilateral feet x 2   Plan of Care:  #1 Patient evaluated. #2 Excisional debridement of keratoic lesion using a chisel blade was performed without incident.  #3 Dressed with light dressing. #4 Mechanical debridement of nails 1-5 bilaterally performed using a nail nipper. Filed with dremel without incident.  #5 Patient is to return to the clinic in 3 months.    Edrick Kins, DPM [facility]  Dr. Edrick Kins, [address]                                        [place], [place] [address]                Office [phone]  Fax [phone]

## 2017-12-02 ENCOUNTER — Inpatient Hospital Stay: Payer: Medicare Other | Attending: Hematology and Oncology

## 2017-12-02 DIAGNOSIS — Z452 Encounter for adjustment and management of vascular access device: Secondary | ICD-10-CM | POA: Insufficient documentation

## 2017-12-02 DIAGNOSIS — C541 Malignant neoplasm of endometrium: Secondary | ICD-10-CM

## 2017-12-02 DIAGNOSIS — C55 Malignant neoplasm of uterus, part unspecified: Secondary | ICD-10-CM | POA: Insufficient documentation

## 2017-12-02 MED ORDER — HEPARIN SOD (PORK) LOCK FLUSH 100 UNIT/ML IV SOLN
500.0000 [IU] | Freq: Once | INTRAVENOUS | Status: AC
  Administered 2017-12-02: 500 [IU]
  Filled 2017-12-02: qty 5

## 2017-12-02 MED ORDER — SODIUM CHLORIDE 0.9% FLUSH
10.0000 mL | Freq: Once | INTRAVENOUS | Status: AC
  Administered 2017-12-02: 10 mL
  Filled 2017-12-02: qty 10

## 2018-01-03 ENCOUNTER — Telehealth: Payer: Self-pay | Admitting: *Deleted

## 2018-01-03 NOTE — Telephone Encounter (Signed)
Patient called back and appt was moved from April 18th to May 2nd

## 2018-01-03 NOTE — Telephone Encounter (Signed)
Called and left a message for the patient to call the office back. Ned to move the patient's appt from April 18th to May 2nd

## 2018-01-13 ENCOUNTER — Ambulatory Visit: Payer: Medicare Other | Admitting: Gynecologic Oncology

## 2018-01-27 ENCOUNTER — Encounter: Payer: Self-pay | Admitting: Gynecologic Oncology

## 2018-01-27 ENCOUNTER — Telehealth: Payer: Self-pay | Admitting: Oncology

## 2018-01-27 ENCOUNTER — Inpatient Hospital Stay: Payer: Medicare Other | Attending: Hematology and Oncology

## 2018-01-27 ENCOUNTER — Inpatient Hospital Stay (HOSPITAL_BASED_OUTPATIENT_CLINIC_OR_DEPARTMENT_OTHER): Payer: Medicare Other | Admitting: Gynecologic Oncology

## 2018-01-27 VITALS — BP 108/72 | HR 77 | Temp 97.8°F | Resp 18 | Ht 60.0 in | Wt 247.0 lb

## 2018-01-27 DIAGNOSIS — Z9221 Personal history of antineoplastic chemotherapy: Secondary | ICD-10-CM

## 2018-01-27 DIAGNOSIS — C541 Malignant neoplasm of endometrium: Secondary | ICD-10-CM

## 2018-01-27 DIAGNOSIS — Z90722 Acquired absence of ovaries, bilateral: Secondary | ICD-10-CM

## 2018-01-27 DIAGNOSIS — Z923 Personal history of irradiation: Secondary | ICD-10-CM

## 2018-01-27 DIAGNOSIS — Z90710 Acquired absence of both cervix and uterus: Secondary | ICD-10-CM

## 2018-01-27 DIAGNOSIS — Z452 Encounter for adjustment and management of vascular access device: Secondary | ICD-10-CM | POA: Diagnosis not present

## 2018-01-27 MED ORDER — SODIUM CHLORIDE 0.9% FLUSH
10.0000 mL | Freq: Once | INTRAVENOUS | Status: AC
  Administered 2018-01-27: 10 mL
  Filled 2018-01-27: qty 10

## 2018-01-27 MED ORDER — HEPARIN SOD (PORK) LOCK FLUSH 100 UNIT/ML IV SOLN
500.0000 [IU] | Freq: Once | INTRAVENOUS | Status: AC
  Administered 2018-01-27: 500 [IU]
  Filled 2018-01-27: qty 5

## 2018-01-27 NOTE — Patient Instructions (Signed)
Please notify Dr Denman George at phone number [phone] if you notice vaginal bleeding, new pelvic or abdominal pains, bloating, feeling full easy, or a change in bladder or bowel function.   Please return to see Dr Denman George in October, 2019 as scheduled and return to see Dr Sondra Come as scheduled in July, 2019.

## 2018-01-27 NOTE — Progress Notes (Signed)
Follow-up Note: Gyn-Onc  Consult was initially requested by Dr. Benjie Karvonen for the evaluation of Melinda Kerr 83 y.o. female  CC:  Chief Complaint  Patient presents with  . Malignant neoplasm of endometrium [facility])    Assessment/Plan:  Ms. Melinda Kerr  is a 83 y.o.  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  Hx of diverticular abscesses in October 2018.  Continue 3 monthly surveillance until February, 2020.  I will see her back in October, 2019. She will see Dr Sondra Come in July, 2019.  HPI: Melinda Kerr is a 83 year old G0 who is seen in consultation of Dr Benjie Karvonen for serous endometrial cancer in the setting of morbid obesity (BMI 44). The patient reports a history of postmenopausal bleeding on 01/28/16. She then saw Dr Benjie Karvonen who performed a TVUS on 02/13/16 which revealed 6.2 x 5.3 x 4cm and a thickened endometrial stripe. There was an echogenic mass measuring 22.8cm felt to represent a dermoid cyst on the left ovary. The right ovary was normal. A D&C was performed on 03/04/16 and showed high grade serous carcinoma.   She has morbid obesity with a BMI of 44kg/m2. She has HTN, and a remote history of a spontaneous DVT treated with 6 months of coumadin. She has chronic back pain and scoliosis. The patient's surgical history is most notable for a laparoscopic cholecystectomy and an umbilical hernia repair without mesh in 2007.  Prior to surgery she underwent CT staging with a CT abdo/pelvis and chest. This showed some suspicious/borderline appearing nodes in her pelvic lymph nodes and at the left PA region (1.1cm in largest dimension).   On 03/26/16 she underwent robotic assisted  total hysterectomy, BSO and SLN biopsy. Intraoperative findings were significant for a clinically suspicious left obturator SLN. Final pathology confirmed a stage IIIC1 high grade serous carcinoma of the endometrium with a 3.8cm tumor invading 1.5 of 1.8cm of myometrial thickness with LVSI present. The ovaries and cervix were free of tumor, however the left obturator SLN contained a macrometastatic (>23mm) focus of disease.   She completed 6 cycles of chemotherapy (carboplatin and paclitaxel) between 05-04-16 thru 08-10-16. Treatment was complicated by bone marrow suppression, bone pains.  CT scan on 09/08/16 showed no gross metastases or recurrence.  She completed vaginal brachytherapy with Dr Sondra Come with in February, 2018.   On 07/13/17 she was admitted to [facility] with diverticulitis and diverticular abscesses seen on CT periappendiceal and on the left pelvis. There was no apparent metastatic recurrent disease. She was treated with antibiotics and has done well.   Interval Hx: She has no complaints today other than fatigue.  Current Meds:  Outpatient Encounter Medications as of 01/27/2018  Medication Sig  . acetaminophen (TYLENOL) 500 MG tablet Take 1,000 mg by mouth 2 (two) times daily.   Marland Kitchen alendronate (FOSAMAX) 70 MG tablet Take 70 mg by mouth once a week. Take with a full glass of water on an empty stomach.  Marland Kitchen allopurinol (ZYLOPRIM) 300 MG tablet Take 300 mg by mouth daily with breakfast.   . calcium gluconate 500 MG tablet Takes 1 tablet in am and 2 in the evening.  . cetirizine (ZYRTEC) 10 MG tablet Take 10 mg by mouth daily.  Marland Kitchen  citalopram (CELEXA) 20 MG tablet Take 20 mg by mouth daily.   . Cyanocobalamin (VITAMIN B-12 PO) Take by mouth.  . Ferrous Sulfate 90 (18 Fe) MG TABS Take 1 tablet by mouth daily.  . furosemide (LASIX) 20 MG tablet Take 20 mg by mouth daily.  Marland Kitchen HAWTHORNE BERRY PO Take 500 mg by mouth 2 (two) times daily.  . hydrOXYzine (ATARAX/VISTARIL) 25 MG tablet Take 25 mg  by mouth at bedtime.  Marland Kitchen levothyroxine (SYNTHROID, LEVOTHROID) 125 MCG tablet Take 125 mcg by mouth daily before breakfast.  . lisinopril (PRINIVIL,ZESTRIL) 20 MG tablet Take 20 mg by mouth daily with breakfast.   . Melatonin 2.5 MG CAPS Take by mouth.  . Multiple Vitamins-Minerals (MULTIVITAMIN WITH MINERALS) tablet Take 1 tablet by mouth daily.  . Omega-3 Fatty Acids (FISH OIL PO) Take 1 tablet by mouth daily.  Marland Kitchen omeprazole (PRILOSEC OTC) 20 MG tablet Take 20 mg by mouth daily.  . polyethylene glycol (MIRALAX / GLYCOLAX) packet Take 17 g by mouth daily as needed for mild constipation or moderate constipation.  Marland Kitchen pyridOXINE (VITAMIN B-6) 100 MG tablet Take 100 mg by mouth daily.  Marland Kitchen tolterodine (DETROL LA) 4 MG 24 hr capsule Take 4 mg by mouth daily.  . traMADol (ULTRAM) 50 MG tablet Take 1-2 tablets (50-100 mg total) by mouth every 6 (six) hours as needed (mild pain).  . vitamin C (ASCORBIC ACID) 500 MG tablet Take 500 mg by mouth daily.  . [DISCONTINUED] ketorolac (ACULAR) 0.5 % ophthalmic solution Place 1 drop into the left eye 4 (four) times daily.  . [DISCONTINUED] lidocaine-prilocaine (EMLA) cream Apply 1-2 hours prior to [facility] access as directed   No facility-administered encounter medications on file as of 01/27/2018.     Allergy:  Allergies  Allergen Reactions  . Oxybutynin     Bleeding gums, felt sick    Social Hx:   Social History   Socioeconomic History  . Marital status: Widowed    Spouse name: Not on file  . Number of children: 0  . Years of education: Not on file  . Highest education level: Not on file  Occupational History  . 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 Financial resource strain: Not on file  . Food insecurity:    Worry: Not on file    Inability: Not on file  . Transportation needs:    Medical: Not on file    Non-medical: Not on file  Tobacco Use  . Smoking status: Never Smoker  . Smokeless tobacco: Never Used  Substance and  Sexual Activity  . Alcohol use: No  . Drug use: No  . Sexual activity: Never  Lifestyle  . Physical activity:    Days per week: Not on file    Minutes per session: Not on file  . Stress: Not on file  Relationships  . Social connections:    Talks on phone: Not on file    Gets together: Not on file    Attends religious service: Not on file    Active member of club or organization: Not on file    Attends meetings of clubs or organizations: Not on file    Relationship status: Not on file  . Intimate partner violence:    Fear of current or ex partner: Not on file    Emotionally abused: Not on file    Physically abused: Not on file    Forced sexual activity: Not on file  Other Topics Concern  .  Not on file  Social History Narrative  . Not on file    Past Surgical Hx:  Past Surgical History:  Procedure Laterality Date  . APPENDECTOMY    . CATARACT EXTRACTION, BILATERAL Bilateral 2018  . DILATATION & CURETTAGE/HYSTEROSCOPY WITH MYOSURE N/A 03/04/2016   Procedure: DILATATION & CURETTAGE/HYSTEROSCOPY ;  Surgeon: Azucena Fallen, MD;  Location: [facility];  Service: Gynecology;  Laterality: N/A;  polyp removal   . HERNIA REPAIR    . IR GENERIC HISTORICAL  05/15/2016   IR US GUIDE VASC ACCESS RIGHT 05/15/2016 Aletta Edouard, MD [facility]  . IR GENERIC HISTORICAL  05/15/2016   IR FLUORO GUIDE CV LINE RIGHT 05/15/2016 Aletta Edouard, MD [facility]  . JOINT REPLACEMENT    . LAPAROSCOPIC CHOLECYSTECTOMY  2004  . LAPAROSCOPIC INCISIONAL / UMBILICAL / VENTRAL HERNIA REPAIR  2004   "w/gallbladder OR"  . LYMPH NODE BIOPSY N/A 03/26/2016   Procedure: Sentinel LYMPH NODE BIOPSY;  Surgeon: Everitt Amber, MD;  Location: [facility];  Service: Gynecology;  Laterality: N/A;  . ROBOTIC ASSISTED TOTAL HYSTERECTOMY WITH BILATERAL SALPINGO OOPHERECTOMY N/A 03/26/2016   Procedure: XI ROBOTIC ASSISTED TOTAL Laproscopic HYSTERECTOMY WITH BILATERAL SALPINGO OOPHORECTOMY;  Surgeon: Everitt Amber, MD;  Location: [facility];   Service: Gynecology;  Laterality: N/A;  . TOTAL KNEE ARTHROPLASTY Left 10/09/2013   Procedure: LEFT TOTAL KNEE ARTHROPLASTY;  Surgeon: [name], MD;  Location: [facility];  Service: Orthopedics;  Laterality: Left;  . TOTAL KNEE ARTHROPLASTY Right 04/09/2014   Procedure: RIGHT TOTAL KNEE ARTHROPLASTY;  Surgeon: [name], MD;  Location: [facility];  Service: Orthopedics;  Laterality: Right;  . TUBAL LIGATION  1982  . VARICOSE VEIN SURGERY Bilateral 1968  . WISDOM TOOTH EXTRACTION  1963    Past Medical Hx:  Past Medical History:  Diagnosis Date  . Arthritis    "shoulders, knees, lower back" (07/14/2017)  . CHF (congestive heart failure) ([place])    "while in hospital w/hysterectomy in 2017"  . Chronic lower back pain   . Depression   . Diverticulitis of large intestine with abscess 07/13/2017  . DVT (deep venous thrombosis) ([place])   . Esophageal reflux   . Gout   . Hyperlipidemia    under control  . Hypertension   . Hypothyroidism   . Morbid obesity with BMI of 40.0-44.9, adult ([place])   . Nocturia   . OSA on CPAP    "not wearing it when I sleep in my lift chair" (07/14/2017)  . Osteoarthritis   . Phlebitis    hx of  . Pneumonia ?1989  . Spondylosis    with scoliosis  . Type II diabetes mellitus (HCC)    borderline , diet controlled   . Urinary frequency   . Uterine cancer (HCC)    S/P chemo, radiation, hysterectomy    Past Gynecological History:  G0  No LMP recorded. Patient has had a hysterectomy.  Family Hx:  Family History  Problem Relation Age of Onset  . Heart disease Mother   . Heart disease Father   . CVA Father     Review of Systems:  Constitutional  Feels well,  + fatigue  ENT Normal appearing ears and nares bilaterally Skin/Breast  No rash, sores, jaundice, itching, dryness Cardiovascular  No chest pain, shortness of breath, or edema  Pulmonary  No cough or wheeze.  Gastro Intestinal  No nausea, vomitting, or diarrhoea. No bright red blood per  rectum, no abdominal pain, change in bowel movement, or constipation.  Genito Urinary  No frequency, urgency, dysuria, no bleeding Musculo Skeletal  No myalgia, arthralgia, joint swelling or pain  Neurologic  No weakness, numbness, change in gait,  Psychology  No depression, anxiety, insomnia.   Vitals:  Blood pressure 108/72, pulse 77, temperature 97.8 F (36.6 C), temperature source Oral, resp. rate 18, height 5' (1.524 m), weight 247 lb (112 kg), SpO2 100 %.  Physical Exam: WD in NAD Neck  Supple NROM, without any enlargements.  Lymph Node Survey No cervical supraclavicular or inguinal adenopathy Cardiovascular  Pulse normal rate, regularity and rhythm. S1 and S2 normal.  Lungs  Clear to auscultation bilateraly. + audible wheezing on exertion. Increased work of breathing when positioning on bed. Good air movement.  Skin  No rash/lesions/breakdown  Psychiatry  Alert and oriented to person, place, and time  Abdomen  Normoactive bowel sounds, abdomen soft, non-tender and obese with pannus folds without evidence of hernia. Well healed incision sites. Back No CVA tenderness Genito Urinary  Vulva/vagina: Normal external female genitalia.  No lesions. No discharge or bleeding.  Bladder/urethra:  No lesions or masses, well supported bladder  Surgically absent cervix and uterus with no masses or lesions.  Vaginal cuff with no lesions  No pelvic masses Rectal  deferred Extremities  No bilateral cyanosis, clubbing or edema.   Thereasa Solo, MD  01/27/2018, 3:17 PM

## 2018-01-27 NOTE — Telephone Encounter (Signed)
Called [facility] in Pathology and ordered MSI on surgical pathology from 03/26/16 per Dr. Denman George.

## 2018-01-28 ENCOUNTER — Other Ambulatory Visit (HOSPITAL_COMMUNITY)
Admission: RE | Admit: 2018-01-28 | Discharge: 2018-01-28 | Disposition: A | Discharge status: Home / Self Care | Payer: Medicare Other | Source: Ambulatory Visit | Attending: Gynecologic Oncology | Admitting: Gynecologic Oncology

## 2018-01-28 DIAGNOSIS — C541 Malignant neoplasm of endometrium: Secondary | ICD-10-CM | POA: Insufficient documentation

## 2018-02-16 ENCOUNTER — Ambulatory Visit (INDEPENDENT_AMBULATORY_CARE_PROVIDER_SITE_OTHER): Payer: Medicare Other | Admitting: Podiatry

## 2018-02-16 DIAGNOSIS — M79676 Pain in unspecified toe(s): Secondary | ICD-10-CM

## 2018-02-16 DIAGNOSIS — B351 Tinea unguium: Secondary | ICD-10-CM | POA: Diagnosis not present

## 2018-02-21 NOTE — Progress Notes (Signed)
   SUBJECTIVE Patient presents to office today complaining of elongated, thickened nails that cause pain while ambulating in shoes. She is unable to trim her own nails. Patient is here for further evaluation and treatment.  Past Medical History:  Diagnosis Date  . Arthritis    "shoulders, knees, lower back" (07/14/2017)  . CHF (congestive heart failure) ([place])    "while in hospital w/hysterectomy in 2017"  . Chronic lower back pain   . Depression   . Diverticulitis of large intestine with abscess 07/13/2017  . DVT (deep venous thrombosis) ([place])   . Esophageal reflux   . Gout   . Hyperlipidemia    under control  . Hypertension   . Hypothyroidism   . Morbid obesity with BMI of 40.0-44.9, adult ([place])   . Nocturia   . OSA on CPAP    "not wearing it when I sleep in my lift chair" (07/14/2017)  . Osteoarthritis   . Phlebitis    hx of  . Pneumonia ?1989  . Spondylosis    with scoliosis  . Type II diabetes mellitus (HCC)    borderline , diet controlled   . Urinary frequency   . Uterine cancer (HCC)    S/P chemo, radiation, hysterectomy    OBJECTIVE General Patient is awake, alert, and oriented x 3 and in no acute distress. Derm Skin is dry and supple bilateral. Negative open lesions or macerations. Remaining integument unremarkable. Nails are tender, long, thickened and dystrophic with subungual debris, consistent with onychomycosis, 1-5 bilateral. No signs of infection noted. Vasc  DP and PT pedal pulses palpable bilaterally. Temperature gradient within normal limits.  Neuro Epicritic and protective threshold sensation diminished bilaterally.  Musculoskeletal Exam No symptomatic pedal deformities noted bilateral. Muscular strength within normal limits.  ASSESSMENT 1. Onychodystrophic nails 1-5 bilateral with hyperkeratosis of nails.  2. Onychomycosis of nail due to dermatophyte bilateral 3. Pain in foot bilateral  PLAN OF CARE 1. Patient evaluated today.  2. Instructed to  maintain good pedal hygiene and foot care.  3. Mechanical debridement of nails 1-5 bilaterally performed using a nail nipper. Filed with dremel without incident.  4. Return to clinic in 3 mos.    Edrick Kins, DPM [facility]  Dr. Edrick Kins, [address]                                        [place], [place] [address]                Office [phone]  Fax [phone]

## 2018-03-24 ENCOUNTER — Inpatient Hospital Stay: Payer: Medicare Other

## 2018-03-24 ENCOUNTER — Encounter: Payer: Self-pay | Admitting: Hematology and Oncology

## 2018-03-24 ENCOUNTER — Inpatient Hospital Stay: Payer: Medicare Other | Attending: Hematology and Oncology | Admitting: Hematology and Oncology

## 2018-03-24 DIAGNOSIS — C55 Malignant neoplasm of uterus, part unspecified: Secondary | ICD-10-CM | POA: Diagnosis not present

## 2018-03-24 DIAGNOSIS — Z6841 Body Mass Index (BMI) 40.0 and over, adult: Secondary | ICD-10-CM | POA: Insufficient documentation

## 2018-03-24 DIAGNOSIS — C541 Malignant neoplasm of endometrium: Secondary | ICD-10-CM

## 2018-03-24 LAB — CBC WITH DIFFERENTIAL/PLATELET
BASOS ABS: 0 10*3/uL (ref 0.0–0.1)
BASOS PCT: 1 %
EOS ABS: 0.2 10*3/uL (ref 0.0–0.5)
Eosinophils Relative: 4 %
HEMATOCRIT: 38.7 % (ref 34.8–46.6)
Hemoglobin: 12.7 g/dL (ref 11.6–15.9)
Lymphocytes Relative: 25 %
Lymphs Abs: 1.3 10*3/uL (ref 0.9–3.3)
MCH: 31.7 pg (ref 25.1–34.0)
MCHC: 32.8 g/dL (ref 31.5–36.0)
MCV: 96.5 fL (ref 79.5–101.0)
MONOS PCT: 11 %
Monocytes Absolute: 0.5 10*3/uL (ref 0.1–0.9)
NEUTROS ABS: 3 10*3/uL (ref 1.5–6.5)
NEUTROS PCT: 59 %
Platelets: 205 10*3/uL (ref 145–400)
RBC: 4.01 MIL/uL (ref 3.70–5.45)
RDW: 14.1 % (ref 11.2–14.5)
WBC: 5.1 10*3/uL (ref 3.9–10.3)

## 2018-03-24 LAB — COMPREHENSIVE METABOLIC PANEL
ALBUMIN: 3.7 g/dL (ref 3.5–5.0)
ALK PHOS: 94 U/L (ref 38–126)
ALT: 20 U/L (ref 0–44)
AST: 21 U/L (ref 15–41)
Anion gap: 5 (ref 5–15)
BILIRUBIN TOTAL: 0.7 mg/dL (ref 0.3–1.2)
BUN: 30 mg/dL — AB (ref 8–23)
CALCIUM: 10.2 mg/dL (ref 8.9–10.3)
CO2: 31 mmol/L (ref 22–32)
Chloride: 102 mmol/L (ref 98–111)
Creatinine, Ser: 1.03 mg/dL — ABNORMAL HIGH (ref 0.44–1.00)
GFR calc Af Amer: 57 mL/min — ABNORMAL LOW (ref >60)
GFR calc non Af Amer: 50 mL/min — ABNORMAL LOW (ref >60)
GLUCOSE: 131 mg/dL — AB (ref 70–99)
Potassium: 4.7 mmol/L (ref 3.5–5.1)
SODIUM: 138 mmol/L (ref 135–145)
TOTAL PROTEIN: 6.7 g/dL (ref 6.5–8.1)

## 2018-03-24 MED ORDER — HEPARIN SOD (PORK) LOCK FLUSH 100 UNIT/ML IV SOLN
500.0000 [IU] | Freq: Once | INTRAVENOUS | Status: AC
  Administered 2018-03-24: 500 [IU]
  Filled 2018-03-24: qty 5

## 2018-03-24 MED ORDER — SODIUM CHLORIDE 0.9% FLUSH
10.0000 mL | Freq: Once | INTRAVENOUS | Status: AC
  Administered 2018-03-24: 10 mL
  Filled 2018-03-24: qty 10

## 2018-03-24 NOTE — Progress Notes (Signed)
[place] OFFICE PROGRESS NOTE  Patient Care Team: Maury Dus, MD as PCP - General (Family Medicine)  ASSESSMENT & PLAN:  Malignant neoplasm of uterus [facility]) Clinically, she has no signs to suggest disease recurrence. She is currently 2 years out and remain cancer free since her surgery in 2017 I recommend port removal She will continue close follow-up with GYN oncologist I educated the patient signs and symptoms to watch out for cancer recurrence  Morbid obesity with BMI of 40.0-44.9, adult [facility]) The patient is morbidly obese and is developing significant joint pain secondary to being overweight We discussed consideration to get enroll in local [employer] for exercise program and dietary advice   Orders Placed This Encounter  Procedures  . IR REMOVAL TUN ACCESS W/ PORT W/O FL MOD SED    Standing Status:   Future    Standing Expiration Date:   05/25/2019    Order Specific Question:   Reason for exam:    Answer:   no need chemo, ok to remove port    Order Specific Question:   Preferred Imaging Location?    Answer:   [facility]    INTERVAL HISTORY: Please see below for problem oriented charting. She returns for further follow-up She feels well Denies abnormal vaginal bleeding, changes in bowel habits or abdominal pain She has chronic constipation, well controlled with chronic stool softener and laxatives She complained of significant knee pain and joint pain and unable to exercise  SUMMARY OF ONCOLOGIC HISTORY:   Malignant neoplasm of uterus ([place])   03/04/2016 Pathology Results    Endometrium, curettage HIGH GRADE SEROUS CARCINOMA      03/04/2016 Surgery    Procedure: Hysteroscopy and D&C      03/24/2016 Imaging    CT abdomen: 1. Abnormally thickened endometrium (20 mm), consistent with the provided history of endometrial cancer. 2. Two mildly enlarged left external iliac lymph nodes and single mildly enlarged left para-aortic node are indeterminate,  nodal metastases not excluded. Otherwise no potential sites of metastatic disease in the chest, abdomen or pelvis. 3. Posterior uterine body 2.3 cm mass, nonspecific, most suggestive of a subserosal fibroid. 4. Fatty attenuation 2.7 cm mass adjacent to the uterine fundus, differential includes a left ovarian dermoid or exophytic Lipoleiomyoma. 5. No ascites. 6. Additional findings include coronary atherosclerosis, aortic atherosclerosis, mosaic attenuation in the lungs most commonly due to air trapping, large hiatal hernia and small fat containing left inguinal hernia.      03/26/2016 Pathology Results    1. Lymph node, sentinel, biopsy, right obturator - ONE BENIGN LYMPH NODE (0/1). 2. Lymph node, sentinel, biopsy, left external iliac - ONE BENIGN LYMPH NODE (0/1). 3. Lymph node, sentinel, biopsy, left obturator - METASTATIC CARCINOMA IN ONE LYMPH NODE (1/1). 4. Lymph node, sentinel, biopsy, left common iliac - ONE BENIGN LYMPH NODE (0/1). 5. Uterus +/- tubes/ovaries, neoplastic, cervix - HIGH GRADE SEROUS CARCINOMA EXTENDING TO THE OUTER HALF OF THE MYOMETRIUM. - LYMPHOVASCULAR INVOLVEMENT BY TUMOR. - MARGINS NOT INVOLVED. - CERVIX, BILATERAL OVARIES AND BILATERAL FALLOPIAN TUBES FREE OF TUMOR. - MATURE CYSTIC TERATOMA OF LEFT OVARY. Microscopic Comment 5. ONCOLOGY TABLE-UTERUS, CARCINOMA OR CARCINOSARCOMA  Specimen: Uterus with bilateral obturator and iliac lymph nodes Procedure: Hysterectomy with bilateral salpingo-oophorectomy and lymph node sampling Lymph node sampling performed: Yes Specimen integrity: Intact Maximum tumor size: 3.8 cm Histologic type: Serous carcinoma Grade: High grade Myometrial invasion: 1.5 cm where myometrium is 1.8 cm in thickness Cervical stromal involvement: No Extent of  involvement of other organs: N/A Lymph - vascular invasion: Present. Peritoneal washings: No submitted Lymph nodes: Examined: 4 Sentinel 0 Non-sentinel 4 Total Lymph nodes with  metastasis: 1 Isolated tumor cells (< 0.2 mm): 0 Micrometastasis: (> 0.2 mm and < 2.0 mm): 0 Macrometastasis: (> 2.0 mm): 1 Extracapsular extension: No Pelvic lymph nodes: 1 involved of 4 lymph nodes. Para-aortic lymph nodes: N/A involved of N/A lymph nodes. Other (specify involvement and site): N/A TNM code: pT1b, pN1 FIGO Stage (based on pathologic findings, needs clinical correlation): IIIC1 Comment: Immunohistochemistry for cytokeratin AE1/AE3 is performed on parts 1, 2 and 4 and no positivity is identified. (JDP:kh 03-30-16)      03/26/2016 Surgery    Operation: Robotic-assisted laparoscopic total hysterectomy with bilateral salpingoophorectomy, sentinel lymph node biopsy  Operative Findings:  : 6cm uterus, grossly normal tubes and ovaries with mild adhesions to pelvis. Suspicious, bulky firm left obturator lymph node. Bilateral mapping. No evidence for intraperitoneal metastases.       05/04/2016 - 08/10/2016 Chemotherapy    She received chemo with carboplatin and Taxol x 5 cycles only      05/15/2016 Procedure    Placement of single lumen port a cath via right internal jugular vein. The catheter tip lies at the cavoatrial junction. A power injectable port a cath was placed and is ready for immediate use.       09/08/2016 Imaging    Ct abdomen: 1. Status post total abdominal hysterectomy and bilateral salpingo-oophorectomy. No definite findings to suggest local recurrence or metastatic disease in the abdomen or pelvis. 2. Large hiatal hernia. 3. Aortic atherosclerosis, in addition to left main and 3 vessel coronary artery disease. Assessment for potential risk factor modification, dietary therapy or pharmacologic therapy may be warranted, if clinically indicated. 4. Additional incidental findings, as above.      10/08/2016 - 11/03/2016 Radiation Therapy    Vaginal Cuff treated to 30 Gy in 5 fractions       REVIEW OF SYSTEMS:   Constitutional: Denies fevers, chills or abnormal  weight loss Eyes: Denies blurriness of vision Ears, nose, mouth, throat, and face: Denies mucositis or sore throat Respiratory: Denies cough, dyspnea or wheezes Cardiovascular: Denies palpitation, chest discomfort or lower extremity swelling Skin: Denies abnormal skin rashes Lymphatics: Denies new lymphadenopathy or easy bruising Neurological:Denies numbness, tingling or new weaknesses Behavioral/Psych: Mood is stable, no new changes  All other systems were reviewed with the patient and are negative.  I have reviewed the past medical history, past surgical history, social history and family history with the patient and they are unchanged from previous note.  ALLERGIES:  is allergic to oxybutynin.  MEDICATIONS:  Current Outpatient Medications  Medication Sig Dispense Refill  . acetaminophen (TYLENOL) 500 MG tablet Take 1,000 mg by mouth 2 (two) times daily.     Marland Kitchen alendronate (FOSAMAX) 70 MG tablet Take 70 mg by mouth once a week. Take with a full glass of water on an empty stomach.    Marland Kitchen allopurinol (ZYLOPRIM) 300 MG tablet Take 300 mg by mouth daily with breakfast.     . calcium gluconate 500 MG tablet Takes 1 tablet in am and 2 in the evening.    . cetirizine (ZYRTEC) 10 MG tablet Take 10 mg by mouth daily.    . citalopram (CELEXA) 20 MG tablet Take 20 mg by mouth daily.     . Cyanocobalamin (VITAMIN B-12 PO) Take by mouth.    . Ferrous Sulfate 90 (18 Fe) MG TABS Take  1 tablet by mouth daily.    . furosemide (LASIX) 20 MG tablet Take 20 mg by mouth daily.    Marland Kitchen HAWTHORNE BERRY PO Take 500 mg by mouth 2 (two) times daily.    . hydrOXYzine (ATARAX/VISTARIL) 25 MG tablet Take 25 mg by mouth at bedtime.    Marland Kitchen levothyroxine (SYNTHROID, LEVOTHROID) 125 MCG tablet Take 125 mcg by mouth daily before breakfast.    . lisinopril (PRINIVIL,ZESTRIL) 20 MG tablet Take 20 mg by mouth daily with breakfast.     . Melatonin 2.5 MG CAPS Take by mouth.    . Multiple Vitamins-Minerals (MULTIVITAMIN WITH  MINERALS) tablet Take 1 tablet by mouth daily.    . Omega-3 Fatty Acids (FISH OIL PO) Take 1 tablet by mouth daily.    Marland Kitchen omeprazole (PRILOSEC OTC) 20 MG tablet Take 20 mg by mouth daily.    . polyethylene glycol (MIRALAX / GLYCOLAX) packet Take 17 g by mouth daily as needed for mild constipation or moderate constipation. 14 each 0  . pyridOXINE (VITAMIN B-6) 100 MG tablet Take 100 mg by mouth daily.    Marland Kitchen tolterodine (DETROL LA) 4 MG 24 hr capsule Take 4 mg by mouth daily.    . traMADol (ULTRAM) 50 MG tablet Take 1-2 tablets (50-100 mg total) by mouth every 6 (six) hours as needed (mild pain). 80 tablet 1  . vitamin C (ASCORBIC ACID) 500 MG tablet Take 500 mg by mouth daily.     No current facility-administered medications for this visit.     PHYSICAL EXAMINATION: ECOG PERFORMANCE STATUS: 2 - Symptomatic, <50% confined to bed  Vitals:   03/24/18 1427  BP: 106/76  Pulse: 87  Resp: 18  Temp: 98.2 F (36.8 C)   Filed Weights   03/24/18 1427  Weight: 248 lb 9.6 oz (112.8 kg)    GENERAL:alert, no distress and comfortable SKIN: skin color, texture, turgor are normal, no rashes or significant lesions EYES: normal, Conjunctiva are pink and non-injected, sclera clear OROPHARYNX:no exudate, no erythema and lips, buccal mucosa, and tongue normal  NECK: supple, thyroid normal size, non-tender, without nodularity LYMPH:  no palpable lymphadenopathy in the cervical, axillary or inguinal LUNGS: clear to auscultation and percussion with normal breathing effort HEART: regular rate & rhythm and no murmurs and no lower extremity edema ABDOMEN:abdomen soft, non-tender and normal bowel sounds Musculoskeletal:no cyanosis of digits and no clubbing  NEURO: alert & oriented x 3 with fluent speech, no focal motor/sensory deficits  LABORATORY DATA:  I have reviewed the data as listed    Component Value Date/Time   NA 139 10/07/2017 1415   NA 138 03/01/2017 1325   K 4.5 10/07/2017 1415   K 4.2  03/01/2017 1325   CL 104 10/07/2017 1415   CO2 27 10/07/2017 1415   CO2 27 03/01/2017 1325   GLUCOSE 139 10/07/2017 1415   GLUCOSE 135 03/01/2017 1325   BUN 23 10/07/2017 1415   BUN 22.5 03/01/2017 1325   CREATININE 0.88 10/07/2017 1415   CREATININE 0.9 03/01/2017 1325   CALCIUM 9.6 10/07/2017 1415   CALCIUM 10.4 03/01/2017 1325   PROT 6.4 10/07/2017 1415   PROT 6.6 03/01/2017 1325   ALBUMIN 3.3 (L) 10/07/2017 1415   ALBUMIN 3.7 03/01/2017 1325   AST 20 10/07/2017 1415   AST 19 03/01/2017 1325   ALT 16 10/07/2017 1415   ALT 14 03/01/2017 1325   ALKPHOS 83 10/07/2017 1415   ALKPHOS 100 03/01/2017 1325   BILITOT 0.4 10/07/2017 1415  BILITOT 0.66 03/01/2017 1325   GFRNONAA >60 10/07/2017 1415   GFRAA >60 10/07/2017 1415    No results found for: SPEP, UPEP  Lab Results  Component Value Date   WBC 5.1 03/24/2018   NEUTROABS 3.0 03/24/2018   HGB 12.7 03/24/2018   HCT 38.7 03/24/2018   MCV 96.5 03/24/2018   PLT 205 03/24/2018      Chemistry      Component Value Date/Time   NA 139 10/07/2017 1415   NA 138 03/01/2017 1325   K 4.5 10/07/2017 1415   K 4.2 03/01/2017 1325   CL 104 10/07/2017 1415   CO2 27 10/07/2017 1415   CO2 27 03/01/2017 1325   BUN 23 10/07/2017 1415   BUN 22.5 03/01/2017 1325   CREATININE 0.88 10/07/2017 1415   CREATININE 0.9 03/01/2017 1325   GLU 198 (H) 06/22/2016 0834      Component Value Date/Time   CALCIUM 9.6 10/07/2017 1415   CALCIUM 10.4 03/01/2017 1325   ALKPHOS 83 10/07/2017 1415   ALKPHOS 100 03/01/2017 1325   AST 20 10/07/2017 1415   AST 19 03/01/2017 1325   ALT 16 10/07/2017 1415   ALT 14 03/01/2017 1325   BILITOT 0.4 10/07/2017 1415   BILITOT 0.66 03/01/2017 1325      All questions were answered. The patient knows to call the clinic with any problems, questions or concerns. No barriers to learning was detected.  I spent 10 minutes counseling the patient face to face. The total time spent in the appointment was 15 minutes  and more than 50% was on counseling and review of test results  Heath Lark, MD 03/24/2018 2:43 PM

## 2018-03-24 NOTE — Assessment & Plan Note (Addendum)
Clinically, she has no signs to suggest disease recurrence. She is currently 2 years out and remain cancer free since her surgery in 2017 I recommend port removal She will continue close follow-up with GYN oncologist I educated the patient signs and symptoms to watch out for cancer recurrence

## 2018-03-24 NOTE — Assessment & Plan Note (Signed)
The patient is morbidly obese and is developing significant joint pain secondary to being overweight We discussed consideration to get enroll in local [employer] for exercise program and dietary advice

## 2018-03-29 DIAGNOSIS — R921 Mammographic calcification found on diagnostic imaging of breast: Secondary | ICD-10-CM | POA: Diagnosis not present

## 2018-04-04 DIAGNOSIS — E1122 Type 2 diabetes mellitus with diabetic chronic kidney disease: Secondary | ICD-10-CM | POA: Diagnosis not present

## 2018-04-04 DIAGNOSIS — R609 Edema, unspecified: Secondary | ICD-10-CM | POA: Diagnosis not present

## 2018-04-04 DIAGNOSIS — Z6841 Body Mass Index (BMI) 40.0 and over, adult: Secondary | ICD-10-CM | POA: Diagnosis not present

## 2018-04-04 DIAGNOSIS — E039 Hypothyroidism, unspecified: Secondary | ICD-10-CM | POA: Diagnosis not present

## 2018-04-04 DIAGNOSIS — N183 Chronic kidney disease, stage 3 (moderate): Secondary | ICD-10-CM | POA: Diagnosis not present

## 2018-04-04 DIAGNOSIS — R350 Frequency of micturition: Secondary | ICD-10-CM | POA: Diagnosis not present

## 2018-04-04 DIAGNOSIS — M109 Gout, unspecified: Secondary | ICD-10-CM | POA: Diagnosis not present

## 2018-04-04 DIAGNOSIS — E7800 Pure hypercholesterolemia, unspecified: Secondary | ICD-10-CM | POA: Diagnosis not present

## 2018-04-04 DIAGNOSIS — Z23 Encounter for immunization: Secondary | ICD-10-CM | POA: Diagnosis not present

## 2018-04-04 DIAGNOSIS — I129 Hypertensive chronic kidney disease with stage 1 through stage 4 chronic kidney disease, or unspecified chronic kidney disease: Secondary | ICD-10-CM | POA: Diagnosis not present

## 2018-04-04 DIAGNOSIS — K219 Gastro-esophageal reflux disease without esophagitis: Secondary | ICD-10-CM | POA: Diagnosis not present

## 2018-04-12 DIAGNOSIS — L304 Erythema intertrigo: Secondary | ICD-10-CM | POA: Diagnosis not present

## 2018-04-12 DIAGNOSIS — L821 Other seborrheic keratosis: Secondary | ICD-10-CM | POA: Diagnosis not present

## 2018-04-12 DIAGNOSIS — Z85828 Personal history of other malignant neoplasm of skin: Secondary | ICD-10-CM | POA: Diagnosis not present

## 2018-04-18 ENCOUNTER — Ambulatory Visit
Admission: RE | Admit: 2018-04-18 | Discharge: 2018-04-18 | Disposition: A | Discharge status: Home / Self Care | Payer: Medicare Other | Source: Ambulatory Visit | Attending: Radiation Oncology | Admitting: Radiation Oncology

## 2018-04-18 ENCOUNTER — Other Ambulatory Visit: Payer: Self-pay

## 2018-04-18 ENCOUNTER — Other Ambulatory Visit: Payer: Self-pay | Admitting: Radiology

## 2018-04-18 ENCOUNTER — Encounter: Payer: Self-pay | Admitting: Radiation Oncology

## 2018-04-18 VITALS — BP 103/59 | HR 82 | Temp 98.1°F | Resp 20 | Ht 60.0 in | Wt 249.6 lb

## 2018-04-18 DIAGNOSIS — Z08 Encounter for follow-up examination after completed treatment for malignant neoplasm: Secondary | ICD-10-CM | POA: Diagnosis not present

## 2018-04-18 DIAGNOSIS — C541 Malignant neoplasm of endometrium: Secondary | ICD-10-CM

## 2018-04-18 DIAGNOSIS — Z8542 Personal history of malignant neoplasm of other parts of uterus: Secondary | ICD-10-CM | POA: Diagnosis not present

## 2018-04-18 NOTE — Progress Notes (Signed)
Radiation Oncology         [phone] ________________________________  Name: Melinda Kerr MRN: [id number]  Date: 04/18/2018  DOB: 02-24-1935  Follow-Up Visit Note  CC: Maury Dus, MD  Gordy Levan, MD    ICD-10-CM   1. 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 She denies any vaginal bleeding or pain in the abdomen. She only gets bloated when eating too much. She gets her port Removed tomorrow.  Since her last visit she has had a CT abdomen and pelvis with contrast on 10/21/2017.   No acute findings are noted in the abdomen or pelvis to account for the patient's symptoms. Colonic diverticulosis without evidence of recurrent Diverticulitis. Appendicoliths in the neck of the appendix. No findings to suggest an acute appendicitis at this time.  Aortic atherosclerosis, in addition to left main and 3 vessel coronary artery disease. Large hiatal hernia. Additional incidental findings, as above.                        ALLERGIES:  is allergic to oxybutynin.  Meds: Current Outpatient Medications  Medication Sig Dispense Refill  . acetaminophen (TYLENOL) 500 MG tablet Take 1,000 mg by mouth 2 (two) times daily.     Marland Kitchen alendronate (FOSAMAX) 70 MG tablet Take 70 mg by mouth once a week. Take with a full glass of water on an empty stomach.    Marland Kitchen allopurinol (ZYLOPRIM) 300 MG tablet Take 300 mg by mouth daily with breakfast.     . calcium gluconate 500 MG tablet Takes 1 tablet in am and 2 in the evening.    . cetirizine (ZYRTEC) 10 MG tablet Take 10 mg by mouth daily.    . citalopram (CELEXA) 20 MG tablet Take 20 mg by mouth daily.     . Cyanocobalamin (VITAMIN B-12 PO) Take by mouth.     . furosemide (LASIX) 20 MG tablet Take 20 mg by mouth daily.    Marland Kitchen HAWTHORNE BERRY PO Take 500 mg by mouth 2 (two) times daily.    . hydrOXYzine (ATARAX/VISTARIL) 25 MG tablet Take 25 mg by mouth at bedtime.    Marland Kitchen levothyroxine (SYNTHROID, LEVOTHROID) 125 MCG tablet Take 125 mcg by mouth daily before breakfast.    . lisinopril (PRINIVIL,ZESTRIL) 20 MG tablet Take 20 mg by mouth daily with breakfast.     . Melatonin 2.5 MG CAPS Take by mouth.    . Multiple Vitamins-Minerals (MULTIVITAMIN WITH MINERALS) tablet Take 1 tablet by mouth daily.    . Omega-3 Fatty Acids (FISH OIL PO) Take 1 tablet by mouth daily.    Marland Kitchen omeprazole (PRILOSEC OTC) 20 MG tablet Take 20 mg by mouth daily.    . polyethylene glycol (MIRALAX / GLYCOLAX) packet Take 17 g by mouth daily as needed for mild constipation or moderate constipation. 14 each 0  . pyridOXINE (VITAMIN B-6) 100 MG tablet Take 100 mg by mouth daily.    Marland Kitchen tolterodine (DETROL LA) 4 MG 24 hr capsule Take 4 mg by mouth daily.    . traMADol (ULTRAM) 50 MG tablet Take 1-2 tablets (50-100 mg total) by mouth  every 6 (six) hours as needed (mild pain). 80 tablet 1  . vitamin C (ASCORBIC ACID) 500 MG tablet Take 500 mg by mouth daily.    . Ferrous Sulfate 90 (18 Fe) MG TABS Take 1 tablet by mouth daily.    Marland Kitchen ketoconazole (NIZORAL) 2 % cream     . pravastatin (PRAVACHOL) 10 MG tablet      No current facility-administered medications for this encounter.     Physical Findings: The patient is in no acute distress. Patient is alert and oriented.  height is 5' (1.524 m) and weight is 249 lb 9.6 oz (113.2 kg). Her oral temperature is 98.1 F (36.7 C). Her blood pressure is 103/59 (abnormal) and her pulse is 82. Her respiration is 20 and oxygen saturation is 96%. .     Lungs are clear to auscultation bilaterally. Heart has regular rate and rhythm. No palpable cervical, supraclavicular, or axillary adenopathy. Abdomen soft, non-tender, normal bowel sounds.  On pelvic  examination the external genitalia were unremarkable. A speculum exam was performed with a small speculum. There are no mucosal lesions noted in the vaginal vault. On bimanual examination there were no pelvic masses appreciated.    Lab Findings: Lab Results  Component Value Date   WBC 5.1 03/24/2018   HGB 12.7 03/24/2018   HCT 38.7 03/24/2018   MCV 96.5 03/24/2018   PLT 205 03/24/2018    Radiographic Findings: No results found.  Impression:    no evidence recurrence on clinical exam   Plan:  Follow up in 6 months. She'll follow up with Dr. Denman George 3 months.   ____________________________________ -----------------------------------  Blair Promise, PhD, MD  This document serves as a record of services personally performed by Gery Pray, MD. It was created on his behalf by Vanessa Ralphs, a trained medical scribe. The creation of this record is based on the scribe's personal observations and the provider's statements to them. This document has been checked and approved by the attending provider.

## 2018-04-18 NOTE — Progress Notes (Signed)
Pt presents today for 6 month f/u with Dr. Sondra Come. Pt endorses using vaginal dilator as directed. Pt denies dysuria. Pt denies vaginal discharge or bleeding. Pt denies rectal bleeding. Pt denies diarrhea and states constipation is well-controlled with daily stool softener and Miralax. Pt denies N/V or abdominal bloating. Pt denies c/o pain.   BP (!) 103/59 (BP Location: Left Arm, Patient Position: Sitting, Cuff Size: Large)   Pulse 82   Temp 98.1 F (36.7 C) (Oral)   Resp 20   Ht 5' (1.524 m)   Wt 249 lb 9.6 oz (113.2 kg)   SpO2 96%   BMI 48.75 kg/m    Wt Readings from Last 3 Encounters:  04/18/18 249 lb 9.6 oz (113.2 kg)  03/24/18 248 lb 9.6 oz (112.8 kg)  01/27/18 247 lb (112 kg)   Loma Sousa, RN BSN

## 2018-04-19 ENCOUNTER — Ambulatory Visit (HOSPITAL_COMMUNITY)
Admission: RE | Admit: 2018-04-19 | Discharge: 2018-04-19 | Disposition: A | Discharge status: Home / Self Care | Payer: Medicare Other | Source: Ambulatory Visit | Attending: Hematology and Oncology | Admitting: Hematology and Oncology

## 2018-04-19 ENCOUNTER — Encounter (HOSPITAL_COMMUNITY): Payer: Self-pay

## 2018-04-19 DIAGNOSIS — F329 Major depressive disorder, single episode, unspecified: Secondary | ICD-10-CM | POA: Insufficient documentation

## 2018-04-19 DIAGNOSIS — Z452 Encounter for adjustment and management of vascular access device: Secondary | ICD-10-CM | POA: Insufficient documentation

## 2018-04-19 DIAGNOSIS — M419 Scoliosis, unspecified: Secondary | ICD-10-CM | POA: Diagnosis not present

## 2018-04-19 DIAGNOSIS — M1990 Unspecified osteoarthritis, unspecified site: Secondary | ICD-10-CM | POA: Diagnosis not present

## 2018-04-19 DIAGNOSIS — E785 Hyperlipidemia, unspecified: Secondary | ICD-10-CM | POA: Diagnosis not present

## 2018-04-19 DIAGNOSIS — I110 Hypertensive heart disease with heart failure: Secondary | ICD-10-CM | POA: Insufficient documentation

## 2018-04-19 DIAGNOSIS — E039 Hypothyroidism, unspecified: Secondary | ICD-10-CM | POA: Insufficient documentation

## 2018-04-19 DIAGNOSIS — Z86718 Personal history of other venous thrombosis and embolism: Secondary | ICD-10-CM | POA: Diagnosis not present

## 2018-04-19 DIAGNOSIS — E119 Type 2 diabetes mellitus without complications: Secondary | ICD-10-CM | POA: Diagnosis not present

## 2018-04-19 DIAGNOSIS — G4733 Obstructive sleep apnea (adult) (pediatric): Secondary | ICD-10-CM | POA: Diagnosis not present

## 2018-04-19 DIAGNOSIS — M109 Gout, unspecified: Secondary | ICD-10-CM | POA: Insufficient documentation

## 2018-04-19 DIAGNOSIS — Z4682 Encounter for fitting and adjustment of non-vascular catheter: Secondary | ICD-10-CM | POA: Diagnosis not present

## 2018-04-19 DIAGNOSIS — C541 Malignant neoplasm of endometrium: Secondary | ICD-10-CM | POA: Diagnosis not present

## 2018-04-19 DIAGNOSIS — K219 Gastro-esophageal reflux disease without esophagitis: Secondary | ICD-10-CM | POA: Insufficient documentation

## 2018-04-19 DIAGNOSIS — Z6841 Body Mass Index (BMI) 40.0 and over, adult: Secondary | ICD-10-CM | POA: Diagnosis not present

## 2018-04-19 DIAGNOSIS — Z8542 Personal history of malignant neoplasm of other parts of uterus: Secondary | ICD-10-CM | POA: Insufficient documentation

## 2018-04-19 DIAGNOSIS — I509 Heart failure, unspecified: Secondary | ICD-10-CM | POA: Insufficient documentation

## 2018-04-19 HISTORY — PX: IR REMOVAL TUN ACCESS W/ PORT W/O FL MOD SED: IMG2290

## 2018-04-19 LAB — PROTIME-INR
INR: 1
PROTHROMBIN TIME: 13.1 s (ref 11.4–15.2)

## 2018-04-19 LAB — CBC
HCT: 41.8 % (ref 36.0–46.0)
Hemoglobin: 13.4 g/dL (ref 12.0–15.0)
MCH: 31.2 pg (ref 26.0–34.0)
MCHC: 32.1 g/dL (ref 30.0–36.0)
MCV: 97.4 fL (ref 78.0–100.0)
Platelets: 231 10*3/uL (ref 150–400)
RBC: 4.29 MIL/uL (ref 3.87–5.11)
RDW: 13.5 % (ref 11.5–15.5)
WBC: 6 10*3/uL (ref 4.0–10.5)

## 2018-04-19 LAB — APTT: aPTT: 29 seconds (ref 24–36)

## 2018-04-19 LAB — GLUCOSE, CAPILLARY: Glucose-Capillary: 110 mg/dL — ABNORMAL HIGH (ref 70–99)

## 2018-04-19 MED ORDER — MIDAZOLAM HCL 2 MG/2ML IJ SOLN
INTRAMUSCULAR | Status: AC
  Filled 2018-04-19: qty 4

## 2018-04-19 MED ORDER — CEFAZOLIN SODIUM-DEXTROSE 2-4 GM/100ML-% IV SOLN
2.0000 g | Freq: Once | INTRAVENOUS | Status: AC
  Administered 2018-04-19: 2 g via INTRAVENOUS

## 2018-04-19 MED ORDER — FENTANYL CITRATE (PF) 100 MCG/2ML IJ SOLN
INTRAMUSCULAR | Status: AC
  Filled 2018-04-19: qty 4

## 2018-04-19 MED ORDER — LIDOCAINE-EPINEPHRINE (PF) 2 %-1:200000 IJ SOLN
INTRAMUSCULAR | Status: AC | PRN
  Administered 2018-04-19: 10 mL

## 2018-04-19 MED ORDER — CEFAZOLIN SODIUM-DEXTROSE 2-4 GM/100ML-% IV SOLN
INTRAVENOUS | Status: AC
  Filled 2018-04-19: qty 100

## 2018-04-19 MED ORDER — FENTANYL CITRATE (PF) 100 MCG/2ML IJ SOLN
INTRAMUSCULAR | Status: AC | PRN
  Administered 2018-04-19: 50 ug via INTRAVENOUS

## 2018-04-19 MED ORDER — SODIUM CHLORIDE 0.9 % IV SOLN
INTRAVENOUS | Status: DC
  Administered 2018-04-19: 13:00:00 via INTRAVENOUS

## 2018-04-19 MED ORDER — LIDOCAINE-EPINEPHRINE (PF) 2 %-1:200000 IJ SOLN
INTRAMUSCULAR | Status: AC
  Filled 2018-04-19: qty 20

## 2018-04-19 MED ORDER — FLUMAZENIL 0.5 MG/5ML IV SOLN
INTRAVENOUS | Status: AC
  Filled 2018-04-19: qty 5

## 2018-04-19 MED ORDER — MIDAZOLAM HCL 2 MG/2ML IJ SOLN
INTRAMUSCULAR | Status: AC | PRN
  Administered 2018-04-19: 1 mg via INTRAVENOUS

## 2018-04-19 MED ORDER — NALOXONE HCL 0.4 MG/ML IJ SOLN
INTRAMUSCULAR | Status: AC
  Filled 2018-04-19: qty 1

## 2018-04-19 NOTE — Procedures (Signed)
Endometrial ca, completed RX  S/p RT IJ PORT REMOVAL  No comp Stable EBL min Full report in pacs

## 2018-04-19 NOTE — H&P (Signed)
Chief Complaint: Patient was seen in consultation today for port removal at the request of [place]  Referring Physician(s): Heath Lark  Supervising Physician: Daryll Brod  Patient Status: [facility] - Out-pt  History of Present Illness: Melinda Kerr is a 83 y.o. female with hx of endometrial cancer. She has completed treatment and is referred for port removal. Port was originally placed in 04/2016. She reports having no issues with it. PMHx, meds, labs, imaging, allergies reviewed. Feels well, no recent fevers, chills, illness. Has been NPO today as directed. Family at bedside.   Past Medical History:  Diagnosis Date  . Arthritis    "shoulders, knees, lower back" (07/14/2017)  . CHF (congestive heart failure) ([place])    "while in hospital w/hysterectomy in 2017"  . Chronic lower back pain   . Depression   . Diverticulitis of large intestine with abscess 07/13/2017  . DVT (deep venous thrombosis) ([place])   . Esophageal reflux   . Gout   . Hyperlipidemia    under control  . Hypertension   . Hypothyroidism   . Morbid obesity with BMI of 40.0-44.9, adult ([place])   . Nocturia   . OSA on CPAP    "not wearing it when I sleep in my lift chair" (07/14/2017)  . Osteoarthritis   . Phlebitis    hx of  . Pneumonia ?1989  . Spondylosis    with scoliosis  . Type II diabetes mellitus (HCC)    borderline , diet controlled   . Urinary frequency   . Uterine cancer (HCC)    S/P chemo, radiation, hysterectomy    Past Surgical History:  Procedure Laterality Date  . APPENDECTOMY    . CATARACT EXTRACTION, BILATERAL Bilateral 2018  . DILATATION & CURETTAGE/HYSTEROSCOPY WITH MYOSURE N/A 03/04/2016   Procedure: DILATATION & CURETTAGE/HYSTEROSCOPY ;  Surgeon: Azucena Fallen, MD;  Location: [facility];  Service: Gynecology;  Laterality: N/A;  polyp removal   . HERNIA REPAIR    . IR GENERIC HISTORICAL  05/15/2016   IR US GUIDE VASC ACCESS RIGHT 05/15/2016 Aletta Edouard, MD [facility]  .  IR GENERIC HISTORICAL  05/15/2016   IR FLUORO GUIDE CV LINE RIGHT 05/15/2016 Aletta Edouard, MD [facility]  . JOINT REPLACEMENT    . LAPAROSCOPIC CHOLECYSTECTOMY  2004  . LAPAROSCOPIC INCISIONAL / UMBILICAL / VENTRAL HERNIA REPAIR  2004   "w/gallbladder OR"  . LYMPH NODE BIOPSY N/A 03/26/2016   Procedure: [place] LYMPH NODE BIOPSY;  Surgeon: Everitt Amber, MD;  Location: [facility];  Service: Gynecology;  Laterality: N/A;  . ROBOTIC ASSISTED TOTAL HYSTERECTOMY WITH BILATERAL SALPINGO OOPHERECTOMY N/A 03/26/2016   Procedure: XI ROBOTIC ASSISTED TOTAL Laproscopic HYSTERECTOMY WITH BILATERAL SALPINGO OOPHORECTOMY;  Surgeon: Everitt Amber, MD;  Location: [facility];  Service: Gynecology;  Laterality: N/A;  . TOTAL KNEE ARTHROPLASTY Left 10/09/2013   Procedure: LEFT TOTAL KNEE ARTHROPLASTY;  Surgeon: [name], MD;  Location: [facility];  Service: Orthopedics;  Laterality: Left;  . TOTAL KNEE ARTHROPLASTY Right 04/09/2014   Procedure: RIGHT TOTAL KNEE ARTHROPLASTY;  Surgeon: [name], MD;  Location: [facility];  Service: Orthopedics;  Laterality: Right;  . TUBAL LIGATION  1982  . VARICOSE VEIN SURGERY Bilateral 1968  . WISDOM TOOTH EXTRACTION  1963    Allergies: Oxybutynin  Medications: Prior to Admission medications   Medication Sig Start Date End Date Taking? Authorizing Provider  acetaminophen (TYLENOL) 500 MG tablet Take 1,000 mg by mouth 2 (two) times daily.    Yes [provider]  alendronate (FOSAMAX) 70 MG tablet Take 70 mg by mouth once a week. Take with a full glass of water on an empty stomach.   Yes [provider]  allopurinol (ZYLOPRIM) 300 MG tablet Take 300 mg by mouth daily with breakfast.    Yes [provider]  calcium gluconate 500 MG tablet Takes 1 tablet in am and 2 in the evening.   Yes [provider]  cetirizine (ZYRTEC) 10 MG tablet Take 10 mg by mouth daily.   Yes [provider]  citalopram (CELEXA) 20 MG tablet Take 20 mg by  mouth daily.  01/10/15  Yes [provider]  Cyanocobalamin (VITAMIN B-12 PO) Take by mouth.   Yes [provider]  Ferrous Sulfate 90 (18 Fe) MG TABS Take 1 tablet by mouth daily.   Yes [provider]  furosemide (LASIX) 20 MG tablet Take 20 mg by mouth daily. 01/10/15  Yes [provider]  HAWTHORNE BERRY PO Take 500 mg by mouth 2 (two) times daily.   Yes [provider]  hydrOXYzine (ATARAX/VISTARIL) 25 MG tablet Take 25 mg by mouth at bedtime.   Yes [provider]  ketoconazole (NIZORAL) 2 % cream  04/12/18  Yes [provider]  levothyroxine (SYNTHROID, LEVOTHROID) 125 MCG tablet Take 125 mcg by mouth daily before breakfast.   Yes [provider]  lisinopril (PRINIVIL,ZESTRIL) 20 MG tablet Take 20 mg by mouth daily with breakfast.    Yes [provider]  Melatonin 2.5 MG CAPS Take by mouth.   Yes [provider]  Multiple Vitamins-Minerals (MULTIVITAMIN WITH MINERALS) tablet Take 1 tablet by mouth daily.   Yes [provider]  Omega-3 Fatty Acids (FISH OIL PO) Take 1 tablet by mouth daily.   Yes [provider]  omeprazole (PRILOSEC OTC) 20 MG tablet Take 20 mg by mouth daily.   Yes [provider]  polyethylene glycol (MIRALAX / GLYCOLAX) packet Take 17 g by mouth daily as needed for mild constipation or moderate constipation. 04/12/14  Yes Perkins, Alexzandrew L, PA-C  pravastatin (PRAVACHOL) 10 MG tablet  02/14/18  Yes [provider]  pyridOXINE (VITAMIN B-6) 100 MG tablet Take 100 mg by mouth daily.   Yes [provider]  tolterodine (DETROL LA) 4 MG 24 hr capsule Take 4 mg by mouth daily.   Yes [provider]  traMADol (ULTRAM) 50 MG tablet Take 1-2 tablets (50-100 mg total) by mouth every 6 (six) hours as needed (mild pain). 04/12/14  Yes Perkins, Alexzandrew L, PA-C  vitamin C (ASCORBIC ACID) 500 MG tablet Take 500 mg by mouth daily.   Yes  [provider]     Family History  Problem Relation Age of Onset  . Heart disease Mother   . Heart disease Father   . CVA Father     Social History   Socioeconomic History  . Marital status: Widowed    Spouse name: Not on file  . Number of children: 0  . Years of education: Not on file  . Highest education level: Not on file  Occupational History  . 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 Financial resource strain: Not on file  . Food insecurity:    Worry: Not on file    Inability: Not on file  . Transportation needs:    Medical: Not on file    Non-medical: Not on file  Tobacco Use  . Smoking status: Never Smoker  . Smokeless tobacco:  Never Used  Substance and Sexual Activity  . Alcohol use: No  . Drug use: No  . Sexual activity: Never  Lifestyle  . Physical activity:    Days per week: Not on file    Minutes per session: Not on file  . Stress: Not on file  Relationships  . Social connections:    Talks on phone: Not on file    Gets together: Not on file    Attends religious service: Not on file    Active member of club or organization: Not on file    Attends meetings of clubs or organizations: Not on file    Relationship status: Not on file  Other Topics Concern  . Not on file  Social History Narrative  . Not on file     Review of Systems: A 12 point ROS discussed and pertinent positives are indicated in the HPI above.  All other systems are negative.  Review of Systems  Vital Signs: BP 106/71   Pulse 74   Temp 98.4 F (36.9 C) (Oral)   Resp 12   SpO2 98%   Physical Exam  Constitutional: She is oriented to person, place, and time. She appears well-developed. No distress.  HENT:  Head: Normocephalic.  Mouth/Throat: Oropharynx is clear and moist.  Neck: Normal range of motion. No tracheal deviation present.  Cardiovascular: Normal rate, regular rhythm and normal heart sounds.  Pulmonary/Chest: Effort normal and breath sounds  normal. No respiratory distress.  Neurological: She is alert and oriented to person, place, and time.  Skin:  (R)chest port palpable    Imaging: No results found.  Labs:  CBC: Recent Labs    07/16/17 0448 10/07/17 1415 03/24/18 1400 04/19/18 1228  WBC 7.2 6.0 5.1 6.0  HGB 10.8* 12.2 12.7 13.4  HCT 34.5* 38.2 38.7 41.8  PLT 307 214 205 231    COAGS: Recent Labs    04/19/18 1228  INR 1.00  APTT 29    BMP: Recent Labs    07/14/17 0648 07/15/17 0437 10/07/17 1415 03/24/18 1400  NA 140 140 139 138  K 3.9 4.0 4.5 4.7  CL 104 104 104 102  CO2 30 29 27 31   GLUCOSE 101* 96 139 131*  BUN 5* <5* 23 30*  CALCIUM 8.9 9.0 9.6 10.2  CREATININE 0.83 0.77 0.88 1.03*  GFRNONAA >60 >60 >60 50*  GFRAA >60 >60 >60 57*    LIVER FUNCTION TESTS: Recent Labs    07/13/17 1856 10/07/17 1415 03/24/18 1400  BILITOT 0.8 0.4 0.7  AST 24 20 21   ALT 16 16 20   ALKPHOS 148* 83 94  PROT 6.3* 6.4 6.7  ALBUMIN 2.8* 3.3* 3.7    TUMOR MARKERS: No results for input(s): AFPTM, CEA, CA199, CHROMGRNA in the last 8760 hours.  Assessment and Plan: Hx endometrial cancer For port removal. Labs ok Risks and benefits of image guided port-a-catheter removal was discussed with the patient including, but not limited to bleeding, infection.  All of the patient's questions were answered, patient is agreeable to proceed. Consent signed and in chart.    Thank you for this interesting consult.  I greatly enjoyed meeting Takiya L Sabella and look forward to participating in their care.  A copy of this report was sent to the requesting provider on this date.  Electronically Signed: Ascencion Dike, PA-C 04/19/2018, 2:14 PM   I spent a total of 20 minutes in face to face in clinical consultation, greater than 50% of which was counseling/coordinating  care for port removal

## 2018-04-19 NOTE — Discharge Instructions (Signed)
Implanted Port Removal, Care After °Refer to this sheet in the next few weeks. These instructions provide you with information about caring for yourself after your procedure. Your health care provider may also give you more specific instructions. Your treatment has been planned according to current medical practices, but problems sometimes occur. Call your health care provider if you have any problems or questions after your procedure. °What can I expect after the procedure? °After the procedure, it is common to have: °· Soreness or pain near your incision. °· Some swelling or bruising near your incision. ° °Follow these instructions at home: °Medicines °· Take over-the-counter and prescription medicines only as told by your health care provider. °· If you were prescribed an antibiotic medicine, take it as told by your health care provider. Do not stop taking the antibiotic even if you start to feel better. °Bathing °· Do not take baths, swim, or use a hot tub until your health care provider approves. Ask your health care provider if you can take showers. You may only be allowed to take sponge baths for bathing. °Incision care °· Follow instructions from your health care provider about how to take care of your incision. Make sure you: °? Wash your hands with soap and water before you change your bandage (dressing). If soap and water are not available, use hand sanitizer. °? Change your dressing as told by your health care provider. °? Keep your dressing dry. °? Leave stitches (sutures), skin glue, or adhesive strips in place. These skin closures may need to stay in place for 2 weeks or longer. If adhesive strip edges start to loosen and curl up, you may trim the loose edges. Do not remove adhesive strips completely unless your health care provider tells you to do that. °· Check your incision area every day for signs of infection. Check for: °? More redness, swelling, or pain. °? More fluid or  blood. °? Warmth. °? Pus or a bad smell. °Driving °· If you received a sedative, do not drive for 24 hours after the procedure. °· If you did not receive a sedative, ask your health care provider when it is safe to drive. °Activity °· Return to your normal activities as told by your health care provider. Ask your health care provider what activities are safe for you. °· Until your health care provider says it is safe: °? Do not lift anything that is heavier than 10 lb (4.5 kg). °? Do not do activities that involve lifting your arms over your head. °General instructions °· Do not use any tobacco products, such as cigarettes, chewing tobacco, and e-cigarettes. Tobacco can delay healing. If you need help quitting, ask your health care provider. °· Keep all follow-up visits as told by your health care provider. This is important. °Contact a health care provider if: °· You have more redness, swelling, or pain around your incision. °· You have more fluid or blood coming from your incision. °· Your incision feels warm to the touch. °· You have pus or a bad smell coming from your incision. °· You have a fever. °· You have pain that is not relieved by your pain medicine. °Get help right away if: °· You have chest pain. °· You have difficulty breathing. °This information is not intended to replace advice given to you by your health care provider. Make sure you discuss any questions you have with your health care provider. °Document Released: 08/26/2015 Document Revised: 02/20/2016 Document Reviewed: 06/19/2015 °Elsevier Interactive Patient   Education © 2018 [employer] °Moderate Conscious Sedation, Adult, Care After °These instructions provide you with information about caring for yourself after your procedure. Your health care provider may also give you more specific instructions. Your treatment has been planned according to current medical practices, but problems sometimes occur. Call your health care provider if you have  any problems or questions after your procedure. °What can I expect after the procedure? °After your procedure, it is common: °· To feel sleepy for several hours. °· To feel clumsy and have poor balance for several hours. °· To have poor judgment for several hours. °· To vomit if you eat too soon. ° °Follow these instructions at home: °For at least 24 hours after the procedure: ° °· Do not: °? Participate in activities where you could fall or become injured. °? Drive. °? Use heavy machinery. °? Drink alcohol. °? Take sleeping pills or medicines that cause drowsiness. °? Make important decisions or sign legal documents. °? Take care of children on your own. °· Rest. °Eating and drinking °· Follow the diet recommended by your health care provider. °· If you vomit: °? Drink water, juice, or soup when you can drink without vomiting. °? Make sure you have little or no nausea before eating solid foods. °General instructions °· Have a responsible adult stay with you until you are awake and alert. °· Take over-the-counter and prescription medicines only as told by your health care provider. °· If you smoke, do not smoke without supervision. °· Keep all follow-up visits as told by your health care provider. This is important. °Contact a health care provider if: °· You keep feeling nauseous or you keep vomiting. °· You feel light-headed. °· You develop a rash. °· You have a fever. °Get help right away if: °· You have trouble breathing. °This information is not intended to replace advice given to you by your health care provider. Make sure you discuss any questions you have with your health care provider. °Document Released: 07/05/2013 Document Revised: 02/17/2016 Document Reviewed: 01/04/2016 °Elsevier Interactive Patient Education © 2018 [employer] Inc. ° °

## 2018-04-21 ENCOUNTER — Ambulatory Visit: Payer: Self-pay | Admitting: Radiation Oncology

## 2018-04-27 DIAGNOSIS — N183 Chronic kidney disease, stage 3 (moderate): Secondary | ICD-10-CM | POA: Diagnosis not present

## 2018-05-10 DIAGNOSIS — H1851 Endothelial corneal dystrophy: Secondary | ICD-10-CM | POA: Diagnosis not present

## 2018-05-18 ENCOUNTER — Ambulatory Visit (INDEPENDENT_AMBULATORY_CARE_PROVIDER_SITE_OTHER): Payer: Medicare Other | Admitting: Podiatry

## 2018-05-18 DIAGNOSIS — B351 Tinea unguium: Secondary | ICD-10-CM

## 2018-05-18 DIAGNOSIS — M79676 Pain in unspecified toe(s): Secondary | ICD-10-CM

## 2018-05-18 DIAGNOSIS — L989 Disorder of the skin and subcutaneous tissue, unspecified: Secondary | ICD-10-CM | POA: Diagnosis not present

## 2018-05-19 DIAGNOSIS — N183 Chronic kidney disease, stage 3 (moderate): Secondary | ICD-10-CM | POA: Diagnosis not present

## 2018-05-22 NOTE — Progress Notes (Signed)
    Subjective: Patient is a 83 y.o. female presenting to the office today with a chief complaint of painful callus lesions to the bilateral feet that have been present for several weeks. Walking and bearing weight increases the pain. She has not done anything at home for treatment.   Patient also complains of elongated, thickened nails that cause pain while ambulating in shoes. She is unable to trim her own nails. Patient presents today for further treatment and evaluation.  Past Medical History:  Diagnosis Date  . Arthritis    "shoulders, knees, lower back" (07/14/2017)  . CHF (congestive heart failure) ([place])    "while in hospital w/hysterectomy in 2017"  . Chronic lower back pain   . Depression   . Diverticulitis of large intestine with abscess 07/13/2017  . DVT (deep venous thrombosis) ([place])   . Esophageal reflux   . Gout   . Hyperlipidemia    under control  . Hypertension   . Hypothyroidism   . Morbid obesity with BMI of 40.0-44.9, adult ([place])   . Nocturia   . OSA on CPAP    "not wearing it when I sleep in my lift chair" (07/14/2017)  . Osteoarthritis   . Phlebitis    hx of  . Pneumonia ?1989  . Spondylosis    with scoliosis  . Type II diabetes mellitus (HCC)    borderline , diet controlled   . Urinary frequency   . Uterine cancer (HCC)    S/P chemo, radiation, hysterectomy    Objective:  Physical Exam General: Alert and oriented x3 in no acute distress  Dermatology: Hyperkeratotic lesions present on the bilateral feet x 2. Pain on palpation with a central nucleated core noted. Skin is warm, dry and supple bilateral lower extremities. Negative for open lesions or macerations. Nails are tender, long, thickened and dystrophic with subungual debris, consistent with onychomycosis, 1-5 bilateral. No signs of infection noted.  Vascular: Palpable pedal pulses bilaterally. No edema or erythema noted. Capillary refill within normal limits.  Neurological: Epicritic and  protective threshold grossly intact bilaterally.   Musculoskeletal Exam: Pain on palpation at the keratotic lesion noted. Range of motion within normal limits bilateral. Muscle strength 5/5 in all groups bilateral.  Assessment: 1. Onychodystrophic nails 1-5 bilateral with hyperkeratosis of nails.  2. Onychomycosis of nail due to dermatophyte bilateral 3. Pre-ulcerative callus lesions x 2 noted to the bilateral feet    Plan of Care:  1. Patient evaluated. 2. Excisional debridement of keratoic lesion using a chisel blade was performed without incident.  3. Dressed with light dressing. 4. Mechanical debridement of nails 1-5 bilaterally performed using a nail nipper. Filed with dremel without incident.  5. Patient is to return to the clinic in 3 months.   Edrick Kins, DPM [facility]  Dr. Edrick Kins, [address]                                        [place], [place] [address]                Office [phone]  Fax [phone]

## 2018-07-11 ENCOUNTER — Inpatient Hospital Stay: Payer: Medicare Other | Attending: Gynecologic Oncology | Admitting: Gynecologic Oncology

## 2018-07-11 ENCOUNTER — Encounter: Payer: Self-pay | Admitting: Gynecologic Oncology

## 2018-07-11 VITALS — BP 133/66 | HR 90 | Temp 98.9°F | Resp 18 | Ht 60.0 in | Wt 260.8 lb

## 2018-07-11 DIAGNOSIS — Z90722 Acquired absence of ovaries, bilateral: Secondary | ICD-10-CM

## 2018-07-11 DIAGNOSIS — Z90710 Acquired absence of both cervix and uterus: Secondary | ICD-10-CM | POA: Diagnosis not present

## 2018-07-11 DIAGNOSIS — C541 Malignant neoplasm of endometrium: Secondary | ICD-10-CM | POA: Diagnosis not present

## 2018-07-11 DIAGNOSIS — Z9221 Personal history of antineoplastic chemotherapy: Secondary | ICD-10-CM

## 2018-07-11 DIAGNOSIS — Z923 Personal history of irradiation: Secondary | ICD-10-CM

## 2018-07-11 NOTE — Patient Instructions (Signed)
Please notify Dr Denman George at phone number [phone] if you notice vaginal bleeding, new pelvic or abdominal pains, bloating, feeling full easy, or a change in bladder or bowel function.  Please ask Dr Clabe Seal staff to schedule an appointment with Dr Denman George in April, 2020 after you have seen him in January.

## 2018-07-11 NOTE — Progress Notes (Signed)
Follow-up Note: Gyn-Onc  Consult was initially requested by Dr. Benjie Karvonen for the evaluation of Melinda Kerr 83 y.o. female  CC:  Chief Complaint  Patient presents with  . Malignant neoplasm of endometrium [facility])    Assessment/Plan:  Melinda Kerr  is a 83 y.o.  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  Hx of diverticular abscesses in October 2018.  Continue 3 monthly surveillance until February, 2020.  I will see her back in April 2020. She will see Dr Sondra Come in January, 2020.  HPI: Melinda Kerr is a 83 year old G0 who is seen in consultation of Dr Benjie Karvonen for serous endometrial cancer in the setting of morbid obesity (BMI 44). The patient reports a history of postmenopausal bleeding on 01/28/16. She then saw Dr Benjie Karvonen who performed a TVUS on 02/13/16 which revealed 6.2 x 5.3 x 4cm and a thickened endometrial stripe. There was an echogenic mass measuring 22.8cm felt to represent a dermoid cyst on the left ovary. The right ovary was normal. A D&C was performed on 03/04/16 and showed high grade serous carcinoma.   She has morbid obesity with a BMI of 44kg/m2. She has HTN, and a remote history of a spontaneous DVT treated with 6 months of coumadin. She has chronic back pain and scoliosis. The patient's surgical history is most notable for a laparoscopic cholecystectomy and an umbilical hernia repair without mesh in 2007.  Prior to surgery she underwent CT staging with a CT abdo/pelvis and chest. This showed some suspicious/borderline appearing nodes in her pelvic lymph nodes and at the left PA region (1.1cm in largest dimension).   On 03/26/16 she underwent robotic assisted  total hysterectomy, BSO and SLN biopsy. Intraoperative findings were significant for a clinically suspicious left obturator SLN. Final pathology confirmed a stage IIIC1 high grade serous carcinoma of the endometrium with a 3.8cm tumor invading 1.5 of 1.8cm of myometrial thickness with LVSI present. The ovaries and cervix were free of tumor, however the left obturator SLN contained a macrometastatic (>13mm) focus of disease.   She completed 6 cycles of chemotherapy (carboplatin and paclitaxel) between 05-04-16 thru 08-10-16. Treatment was complicated by bone marrow suppression, bone pains.  CT scan on 09/08/16 showed no gross metastases or recurrence.  She completed vaginal brachytherapy with Dr Sondra Come with in February, 2018.   On 07/13/17 she was admitted to [facility] with diverticulitis and diverticular abscesses seen on CT periappendiceal and on the left pelvis. There was no apparent metastatic recurrent disease. She was treated with antibiotics and has done well.   Interval Hx: She has no complaints today other than fatigue.  Current Meds:  Outpatient Encounter Medications as of 07/11/2018  Medication Sig  . acetaminophen (TYLENOL) 500 MG tablet Take 1,000 mg by mouth 2 (two) times daily.   Marland Kitchen alendronate (FOSAMAX) 70 MG tablet Take 70 mg by mouth once a week. Take with a full glass of water on an empty stomach.  Marland Kitchen allopurinol (ZYLOPRIM) 300 MG tablet Take 300 mg by mouth daily with breakfast.   . calcium gluconate 500 MG tablet Takes 1 tablet in am and 2 in the evening.  . cetirizine (ZYRTEC) 10 MG tablet Take 10 mg by mouth daily.  Marland Kitchen  citalopram (CELEXA) 20 MG tablet Take 20 mg by mouth daily.   . Cyanocobalamin (VITAMIN B-12 PO) Take by mouth.  . Ferrous Sulfate 90 (18 Fe) MG TABS Take 1 tablet by mouth daily.  . furosemide (LASIX) 20 MG tablet Take 20 mg by mouth daily.  Marland Kitchen HAWTHORNE BERRY PO Take 500 mg by mouth 2 (two) times daily.  . hydrOXYzine (ATARAX/VISTARIL) 25 MG tablet Take 25  mg by mouth at bedtime.  Marland Kitchen ketoconazole (NIZORAL) 2 % cream   . levothyroxine (SYNTHROID, LEVOTHROID) 125 MCG tablet Take 125 mcg by mouth daily before breakfast.  . lisinopril (PRINIVIL,ZESTRIL) 20 MG tablet Take 20 mg by mouth daily with breakfast.   . Melatonin 2.5 MG CAPS Take by mouth.  . Multiple Vitamins-Minerals (MULTIVITAMIN WITH MINERALS) tablet Take 1 tablet by mouth daily.  . Omega-3 Fatty Acids (FISH OIL PO) Take 1 tablet by mouth daily.  Marland Kitchen omeprazole (PRILOSEC OTC) 20 MG tablet Take 20 mg by mouth daily.  . polyethylene glycol (MIRALAX / GLYCOLAX) packet Take 17 g by mouth daily as needed for mild constipation or moderate constipation.  . pravastatin (PRAVACHOL) 10 MG tablet   . pyridOXINE (VITAMIN B-6) 100 MG tablet Take 100 mg by mouth daily.  Marland Kitchen tolterodine (DETROL LA) 4 MG 24 hr capsule Take 4 mg by mouth daily.  . traMADol (ULTRAM) 50 MG tablet Take 1-2 tablets (50-100 mg total) by mouth every 6 (six) hours as needed (mild pain).  . vitamin C (ASCORBIC ACID) 500 MG tablet Take 500 mg by mouth daily.   No facility-administered encounter medications on file as of 07/11/2018.     Allergy:  Allergies  Allergen Reactions  . Oxybutynin     Bleeding gums, felt sick    Social Hx:   Social History   Socioeconomic History  . Marital status: Widowed    Spouse name: Not on file  . Number of children: 0  . Years of education: Not on file  . Highest education level: Not on file  Occupational History  . 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 Financial resource strain: Not on file  . Food insecurity:    Worry: Not on file    Inability: Not on file  . Transportation needs:    Medical: Not on file    Non-medical: Not on file  Tobacco Use  . Smoking status: Never Smoker  . Smokeless tobacco: Never Used  Substance and Sexual Activity  . Alcohol use: No  . Drug use: No  . Sexual activity: Never  Lifestyle  . Physical activity:    Days per week: Not on  file    Minutes per session: Not on file  . Stress: Not on file  Relationships  . Social connections:    Talks on phone: Not on file    Gets together: Not on file    Attends religious service: Not on file    Active member of club or organization: Not on file    Attends meetings of clubs or organizations: Not on file    Relationship status: Not on file  . Intimate partner violence:    Fear of current or ex partner: Not on file    Emotionally abused: Not on file    Physically abused: Not on file    Forced sexual activity: Not on file  Other Topics Concern  . Not on file  Social History Narrative  . Not on file    Past Surgical Hx:  Past Surgical History:  Procedure Laterality Date  . APPENDECTOMY    . CATARACT EXTRACTION, BILATERAL Bilateral 2018  . DILATATION & CURETTAGE/HYSTEROSCOPY WITH MYOSURE N/A 03/04/2016   Procedure: DILATATION & CURETTAGE/HYSTEROSCOPY ;  Surgeon: Azucena Fallen, MD;  Location: [facility];  Service: Gynecology;  Laterality: N/A;  polyp removal   . HERNIA REPAIR    . IR GENERIC HISTORICAL  05/15/2016   IR US GUIDE VASC ACCESS RIGHT 05/15/2016 Aletta Edouard, MD [facility]  . IR GENERIC HISTORICAL  05/15/2016   IR FLUORO GUIDE CV LINE RIGHT 05/15/2016 Aletta Edouard, MD [facility]  . IR REMOVAL TUN ACCESS W/ PORT W/O FL MOD SED  04/19/2018  . JOINT REPLACEMENT    . LAPAROSCOPIC CHOLECYSTECTOMY  2004  . LAPAROSCOPIC INCISIONAL / UMBILICAL / VENTRAL HERNIA REPAIR  2004   "w/gallbladder OR"  . LYMPH NODE BIOPSY N/A 03/26/2016   Procedure: Sentinel LYMPH NODE BIOPSY;  Surgeon: Everitt Amber, MD;  Location: [facility];  Service: Gynecology;  Laterality: N/A;  . ROBOTIC ASSISTED TOTAL HYSTERECTOMY WITH BILATERAL SALPINGO OOPHERECTOMY N/A 03/26/2016   Procedure: XI ROBOTIC ASSISTED TOTAL Laproscopic HYSTERECTOMY WITH BILATERAL SALPINGO OOPHORECTOMY;  Surgeon: Everitt Amber, MD;  Location: [facility];  Service: Gynecology;  Laterality: N/A;  . TOTAL KNEE ARTHROPLASTY Left 10/09/2013    Procedure: LEFT TOTAL KNEE ARTHROPLASTY;  Surgeon: [name], MD;  Location: [facility];  Service: Orthopedics;  Laterality: Left;  . TOTAL KNEE ARTHROPLASTY Right 04/09/2014   Procedure: RIGHT TOTAL KNEE ARTHROPLASTY;  Surgeon: [name], MD;  Location: [facility];  Service: Orthopedics;  Laterality: Right;  . TUBAL LIGATION  1982  . VARICOSE VEIN SURGERY Bilateral 1968  . WISDOM TOOTH EXTRACTION  1963    Past Medical Hx:  Past Medical History:  Diagnosis Date  . Arthritis    "shoulders, knees, lower back" (07/14/2017)  . CHF (congestive heart failure) ([place])    "while in hospital w/hysterectomy in 2017"  . Chronic lower back pain   . Depression   . Diverticulitis of large intestine with abscess 07/13/2017  . DVT (deep venous thrombosis) ([place])   . Esophageal reflux   . Gout   . Hyperlipidemia    under control  . Hypertension   . Hypothyroidism   . Morbid obesity with BMI of 40.0-44.9, adult ([place])   . Nocturia   . OSA on CPAP    "not wearing it when I sleep in my lift chair" (07/14/2017)  . Osteoarthritis   . Phlebitis    hx of  . Pneumonia ?1989  . Spondylosis    with scoliosis  . Type II diabetes mellitus (HCC)    borderline , diet controlled   . Urinary frequency   . Uterine cancer (HCC)    S/P chemo, radiation, hysterectomy    Past Gynecological History:  G0  No LMP recorded. Patient has had a hysterectomy.  Family Hx:  Family History  Problem Relation Age of Onset  . Heart disease Mother   . Heart disease Father   . CVA Father     Review of Systems:  Constitutional  Feels well,  + fatigue  ENT Normal appearing ears and nares bilaterally Skin/Breast  No rash, sores, jaundice, itching, dryness Cardiovascular  No chest pain, shortness of breath, or edema  Pulmonary  No cough or wheeze.  Gastro Intestinal  No nausea, vomitting, or diarrhoea. No bright red blood per rectum, no abdominal pain, change in bowel movement, or constipation.  Genito  Urinary  No  frequency, urgency, dysuria, no bleeding Musculo Skeletal  No myalgia, arthralgia, joint swelling or pain  Neurologic  No weakness, numbness, change in gait,  Psychology  No depression, anxiety, insomnia.   Vitals:  Blood pressure 133/66, pulse 90, temperature 98.9 F (37.2 C), temperature source Oral, resp. rate 18, height 5' (1.524 m), weight 260 lb 12.8 oz (118.3 kg), SpO2 96 %.  Physical Exam: WD in NAD Neck  Supple NROM, without any enlargements.  Lymph Node Survey No cervical supraclavicular or inguinal adenopathy Cardiovascular  Pulse normal rate, regularity and rhythm. S1 and S2 normal.  Lungs  Clear to auscultation bilateraly. + audible wheezing on exertion. Increased work of breathing when positioning on bed. Good air movement.  Skin  No rash/lesions/breakdown  Psychiatry  Alert and oriented to person, place, and time  Abdomen  Normoactive bowel sounds, abdomen soft, non-tender and obese with pannus folds without evidence of hernia. Well healed incision sites. Back No CVA tenderness Genito Urinary  Vulva/vagina: Normal external female genitalia.  No lesions. No discharge or bleeding.  Bladder/urethra:  No lesions or masses, well supported bladder  Surgically absent cervix and uterus with no masses or lesions.  Vaginal cuff with no lesions  No pelvic masses Rectal  deferred Extremities  No bilateral cyanosis, clubbing or edema.   Thereasa Solo, MD  07/11/2018, 2:54 PM

## 2018-07-12 DIAGNOSIS — Z23 Encounter for immunization: Secondary | ICD-10-CM | POA: Diagnosis not present

## 2018-08-15 ENCOUNTER — Other Ambulatory Visit: Payer: Self-pay

## 2018-08-15 ENCOUNTER — Encounter: Payer: Self-pay | Admitting: Podiatry

## 2018-08-15 ENCOUNTER — Ambulatory Visit (INDEPENDENT_AMBULATORY_CARE_PROVIDER_SITE_OTHER): Payer: Medicare Other | Admitting: Podiatry

## 2018-08-15 DIAGNOSIS — B351 Tinea unguium: Secondary | ICD-10-CM

## 2018-08-15 DIAGNOSIS — M79676 Pain in unspecified toe(s): Secondary | ICD-10-CM

## 2018-08-15 DIAGNOSIS — L989 Disorder of the skin and subcutaneous tissue, unspecified: Secondary | ICD-10-CM | POA: Diagnosis not present

## 2018-08-17 NOTE — Progress Notes (Signed)
    Subjective: Patient is a 83 y.o. female presenting to the office today with a chief complaint of painful callus lesions to the bilateral feet that have been present for the past few months. Walking and bearing weight increases the pain. She has not had any recent treatment.   Patient also complains of elongated, thickened nails that cause pain while ambulating in shoes. She is unable to trim her own nails. Patient presents today for further treatment and evaluation.  Past Medical History:  Diagnosis Date  . Arthritis    "shoulders, knees, lower back" (07/14/2017)  . CHF (congestive heart failure) ([place])    "while in hospital w/hysterectomy in 2017"  . Chronic lower back pain   . Depression   . Diverticulitis of large intestine with abscess 07/13/2017  . DVT (deep venous thrombosis) ([place])   . Esophageal reflux   . Gout   . Hyperlipidemia    under control  . Hypertension   . Hypothyroidism   . Morbid obesity with BMI of 40.0-44.9, adult ([place])   . Nocturia   . OSA on CPAP    "not wearing it when I sleep in my lift chair" (07/14/2017)  . Osteoarthritis   . Phlebitis    hx of  . Pneumonia ?1989  . Spondylosis    with scoliosis  . Type II diabetes mellitus (HCC)    borderline , diet controlled   . Urinary frequency   . Uterine cancer (HCC)    S/P chemo, radiation, hysterectomy    Objective:  Physical Exam General: Alert and oriented x3 in no acute distress  Dermatology: Hyperkeratotic lesions present on the bilateral feet x 2. Pain on palpation with a central nucleated core noted. Skin is warm, dry and supple bilateral lower extremities. Negative for open lesions or macerations. Nails are tender, long, thickened and dystrophic with subungual debris, consistent with onychomycosis, 1-5 bilateral. No signs of infection noted.  Vascular: Palpable pedal pulses bilaterally. No edema or erythema noted. Capillary refill within normal limits.  Neurological: Epicritic and protective  threshold grossly intact bilaterally.   Musculoskeletal Exam: Pain on palpation at the keratotic lesion noted. Range of motion within normal limits bilateral. Muscle strength 5/5 in all groups bilateral.  Assessment: 1. Onychodystrophic nails 1-5 bilateral with hyperkeratosis of nails.  2. Onychomycosis of nail due to dermatophyte bilateral 3. Pre-ulcerative callus lesions x 2 noted to the bilateral feet    Plan of Care:  1. Patient evaluated. 2. Excisional debridement of keratoic lesion using a chisel blade was performed without incident.  3. Dressed with light dressing. 4. Mechanical debridement of nails 1-5 bilaterally performed using a nail nipper. Filed with dremel without incident.  5. Patient is to return to the clinic in 3 months.   Edrick Kins, DPM [facility]  Dr. Edrick Kins, [address]                                        [place], [place] [address]                Office [phone]  Fax [phone]

## 2018-09-26 DIAGNOSIS — R921 Mammographic calcification found on diagnostic imaging of breast: Secondary | ICD-10-CM | POA: Diagnosis not present

## 2018-10-05 DIAGNOSIS — E039 Hypothyroidism, unspecified: Secondary | ICD-10-CM | POA: Diagnosis not present

## 2018-10-05 DIAGNOSIS — E1122 Type 2 diabetes mellitus with diabetic chronic kidney disease: Secondary | ICD-10-CM | POA: Diagnosis not present

## 2018-10-05 DIAGNOSIS — Z6841 Body Mass Index (BMI) 40.0 and over, adult: Secondary | ICD-10-CM | POA: Diagnosis not present

## 2018-10-05 DIAGNOSIS — G4700 Insomnia, unspecified: Secondary | ICD-10-CM | POA: Diagnosis not present

## 2018-10-05 DIAGNOSIS — F3341 Major depressive disorder, recurrent, in partial remission: Secondary | ICD-10-CM | POA: Diagnosis not present

## 2018-10-05 DIAGNOSIS — E7800 Pure hypercholesterolemia, unspecified: Secondary | ICD-10-CM | POA: Diagnosis not present

## 2018-10-05 DIAGNOSIS — N183 Chronic kidney disease, stage 3 (moderate): Secondary | ICD-10-CM | POA: Diagnosis not present

## 2018-10-05 DIAGNOSIS — I129 Hypertensive chronic kidney disease with stage 1 through stage 4 chronic kidney disease, or unspecified chronic kidney disease: Secondary | ICD-10-CM | POA: Diagnosis not present

## 2018-10-05 DIAGNOSIS — K219 Gastro-esophageal reflux disease without esophagitis: Secondary | ICD-10-CM | POA: Diagnosis not present

## 2018-10-05 DIAGNOSIS — F419 Anxiety disorder, unspecified: Secondary | ICD-10-CM | POA: Diagnosis not present

## 2018-10-05 DIAGNOSIS — Z0000 Encounter for general adult medical examination without abnormal findings: Secondary | ICD-10-CM | POA: Diagnosis not present

## 2018-10-05 DIAGNOSIS — M109 Gout, unspecified: Secondary | ICD-10-CM | POA: Diagnosis not present

## 2018-10-18 ENCOUNTER — Emergency Department (HOSPITAL_COMMUNITY)
Admission: EM | Admit: 2018-10-18 | Discharge: 2018-10-19 | Disposition: A | Discharge status: Home / Self Care | Payer: Medicare Other | Attending: Emergency Medicine | Admitting: Emergency Medicine

## 2018-10-18 ENCOUNTER — Emergency Department (HOSPITAL_COMMUNITY): Payer: Medicare Other

## 2018-10-18 ENCOUNTER — Encounter (HOSPITAL_COMMUNITY): Payer: Self-pay

## 2018-10-18 ENCOUNTER — Other Ambulatory Visit: Payer: Self-pay

## 2018-10-18 DIAGNOSIS — Z96653 Presence of artificial knee joint, bilateral: Secondary | ICD-10-CM | POA: Insufficient documentation

## 2018-10-18 DIAGNOSIS — E039 Hypothyroidism, unspecified: Secondary | ICD-10-CM | POA: Insufficient documentation

## 2018-10-18 DIAGNOSIS — W19XXXA Unspecified fall, initial encounter: Secondary | ICD-10-CM

## 2018-10-18 DIAGNOSIS — R0902 Hypoxemia: Secondary | ICD-10-CM | POA: Diagnosis not present

## 2018-10-18 DIAGNOSIS — S43004A Unspecified dislocation of right shoulder joint, initial encounter: Secondary | ICD-10-CM

## 2018-10-18 DIAGNOSIS — I2510 Atherosclerotic heart disease of native coronary artery without angina pectoris: Secondary | ICD-10-CM | POA: Diagnosis not present

## 2018-10-18 DIAGNOSIS — S42211A Unspecified displaced fracture of surgical neck of right humerus, initial encounter for closed fracture: Secondary | ICD-10-CM | POA: Diagnosis not present

## 2018-10-18 DIAGNOSIS — W108XXA Fall (on) (from) other stairs and steps, initial encounter: Secondary | ICD-10-CM | POA: Insufficient documentation

## 2018-10-18 DIAGNOSIS — S42221A 2-part displaced fracture of surgical neck of right humerus, initial encounter for closed fracture: Secondary | ICD-10-CM | POA: Diagnosis not present

## 2018-10-18 DIAGNOSIS — M25519 Pain in unspecified shoulder: Secondary | ICD-10-CM | POA: Diagnosis not present

## 2018-10-18 DIAGNOSIS — S01111A Laceration without foreign body of right eyelid and periocular area, initial encounter: Secondary | ICD-10-CM | POA: Diagnosis not present

## 2018-10-18 DIAGNOSIS — S43014A Anterior dislocation of right humerus, initial encounter: Secondary | ICD-10-CM | POA: Diagnosis not present

## 2018-10-18 DIAGNOSIS — Y9389 Activity, other specified: Secondary | ICD-10-CM | POA: Diagnosis not present

## 2018-10-18 DIAGNOSIS — S0181XA Laceration without foreign body of other part of head, initial encounter: Secondary | ICD-10-CM | POA: Diagnosis not present

## 2018-10-18 DIAGNOSIS — Y998 Other external cause status: Secondary | ICD-10-CM | POA: Insufficient documentation

## 2018-10-18 DIAGNOSIS — E119 Type 2 diabetes mellitus without complications: Secondary | ICD-10-CM | POA: Diagnosis not present

## 2018-10-18 DIAGNOSIS — S199XXA Unspecified injury of neck, initial encounter: Secondary | ICD-10-CM | POA: Diagnosis not present

## 2018-10-18 DIAGNOSIS — S0990XA Unspecified injury of head, initial encounter: Secondary | ICD-10-CM | POA: Diagnosis not present

## 2018-10-18 DIAGNOSIS — Z79899 Other long term (current) drug therapy: Secondary | ICD-10-CM | POA: Diagnosis not present

## 2018-10-18 DIAGNOSIS — Z8542 Personal history of malignant neoplasm of other parts of uterus: Secondary | ICD-10-CM | POA: Insufficient documentation

## 2018-10-18 DIAGNOSIS — I509 Heart failure, unspecified: Secondary | ICD-10-CM | POA: Insufficient documentation

## 2018-10-18 DIAGNOSIS — R52 Pain, unspecified: Secondary | ICD-10-CM | POA: Diagnosis not present

## 2018-10-18 DIAGNOSIS — I959 Hypotension, unspecified: Secondary | ICD-10-CM | POA: Diagnosis not present

## 2018-10-18 DIAGNOSIS — F329 Major depressive disorder, single episode, unspecified: Secondary | ICD-10-CM | POA: Insufficient documentation

## 2018-10-18 DIAGNOSIS — Y92019 Unspecified place in single-family (private) house as the place of occurrence of the external cause: Secondary | ICD-10-CM | POA: Insufficient documentation

## 2018-10-18 DIAGNOSIS — I110 Hypertensive heart disease with heart failure: Secondary | ICD-10-CM | POA: Diagnosis not present

## 2018-10-18 DIAGNOSIS — Z9049 Acquired absence of other specified parts of digestive tract: Secondary | ICD-10-CM | POA: Insufficient documentation

## 2018-10-18 DIAGNOSIS — S42201A Unspecified fracture of upper end of right humerus, initial encounter for closed fracture: Secondary | ICD-10-CM | POA: Diagnosis not present

## 2018-10-18 MED ORDER — OXYCODONE-ACETAMINOPHEN 5-325 MG PO TABS
1.0000 | ORAL_TABLET | Freq: Once | ORAL | Status: AC
  Administered 2018-10-19: 1 via ORAL
  Filled 2018-10-18: qty 1

## 2018-10-18 NOTE — ED Triage Notes (Signed)
Per EMS, patient coming from home with complaints of a fall. Patient tripped and hit her head on the hardwood floor. Patient has a half inch semicircular lac above the right eye. EMS reports that there was about 100 mL of blood and clots on the floor. Head is bandaged with guaze. Patient has a c-collar on but is not complaining of neck or back pain. Patient is complaining of right shoulder pain currently.  Patient denies LOC and is not currently on blood thinners. Patient does take a baby aspirin daily.

## 2018-10-18 NOTE — ED Notes (Signed)
Bed: WA03 Expected date:  Expected time:  Means of arrival:  Comments: 84 yr old fall eye laceration

## 2018-10-18 NOTE — ED Provider Notes (Signed)
[facility] DEPT Provider Note   CSN: [id number] Arrival date & time: 10/18/18  2254     History   Chief Complaint Chief Complaint  Patient presents with  . Fall    HPI Melinda Kerr is a 84 y.o. female.  The history is provided by the patient and medical records.     84 y.o. F with hx of arthritis, CHF, chronic back pain, depression, gout, HLP, hypertension, hypothyroidism, OA, presenting to the ED following a mechanical fall.  Patient reports she was walking at home when she tripped over a small step causing her to fall onto right side.  States she struck her head on the hardwood floor, no LOC.  She did noticed small pool of blood on the floor.  States she has some mild neck pain, right arm/shoulder pain.  She denies significant headache.  Does have laceration above right eye.  Denies numbness, weakness, confusion, dizziness, slurred speech, changes in vision.  She is not on anticoagulation.  Tetanus is UTD.  Past Medical History:  Diagnosis Date  . Arthritis    "shoulders, knees, lower back" (07/14/2017)  . CHF (congestive heart failure) ([place])    "while in hospital w/hysterectomy in 2017"  . Chronic lower back pain   . Depression   . Diverticulitis of large intestine with abscess 07/13/2017  . DVT (deep venous thrombosis) ([place])   . Esophageal reflux   . Gout   . Hyperlipidemia    under control  . Hypertension   . Hypothyroidism   . Morbid obesity with BMI of 40.0-44.9, adult ([place])   . Nocturia   . OSA on CPAP    "not wearing it when I sleep in my lift chair" (07/14/2017)  . Osteoarthritis   . Phlebitis    hx of  . Pneumonia ?1989  . Spondylosis    with scoliosis  . Type II diabetes mellitus (HCC)    borderline , diet controlled   . Urinary frequency   . Uterine cancer (HCC)    S/P chemo, radiation, hysterectomy    Patient Active Problem List   Diagnosis Date Noted  . Acute appendicitis   . Diverticulitis of large intestine  with abscess 07/13/2017  . Colitis 07/13/2017  . Aortic atherosclerosis ([place]) 09/12/2016  . Coronary artery calcification 09/12/2016  . Hiatal hernia 09/12/2016  . Lumbar disc disease 09/12/2016  . Chemotherapy-induced thrombocytopenia 08/04/2016  . Chemotherapy induced neutropenia ([place]) 05/26/2016  . Port catheter in place 05/26/2016  . Encounter for antineoplastic chemotherapy 05/26/2016  . Urinary frequency 04/24/2016  . Obstructive sleep apnea on CPAP 04/24/2016  . History of DVT (deep vein thrombosis) 04/24/2016  . Endometrial cancer, FIGO stage IIIC ([place]) 04/24/2016  . Malignant neoplasm of endometrium ([place]) 03/26/2016  . Dyspnea on exertion 03/19/2016  . Malignant neoplasm of uterus ([place]) 03/19/2016  . Status post right knee replacement 04/16/2014  . Essential hypertension 04/16/2014  . Postoperative anemia due to acute blood loss 04/12/2014  . Status post total left knee replacement 10/14/2013  . Thyroid activity decreased 10/14/2013  . UI (urinary incontinence) 10/14/2013  . Dyslipidemia 10/14/2013  . Constipation 10/14/2013  . Gout 10/14/2013  . Anemia 10/14/2013  . Allergic rhinitis 10/14/2013  . Insomnia 10/14/2013  . Depression 10/14/2013  . OA (osteoarthritis) of knee 10/09/2013  . OSA on CPAP 08/10/2013  . Morbid obesity with BMI of 40.0-44.9, adult [facility])     Past Surgical History:  Procedure Laterality Date  . APPENDECTOMY    .  CATARACT EXTRACTION, BILATERAL Bilateral 2018  . DILATATION & CURETTAGE/HYSTEROSCOPY WITH MYOSURE N/A 03/04/2016   Procedure: DILATATION & CURETTAGE/HYSTEROSCOPY ;  Surgeon: Azucena Fallen, MD;  Location: [facility];  Service: Gynecology;  Laterality: N/A;  polyp removal   . HERNIA REPAIR    . IR GENERIC HISTORICAL  05/15/2016   IR US GUIDE VASC ACCESS RIGHT 05/15/2016 Aletta Edouard, MD [facility]  . IR GENERIC HISTORICAL  05/15/2016   IR FLUORO GUIDE CV LINE RIGHT 05/15/2016 Aletta Edouard, MD [facility]  . IR REMOVAL TUN ACCESS W/  PORT W/O FL MOD SED  04/19/2018  . JOINT REPLACEMENT    . LAPAROSCOPIC CHOLECYSTECTOMY  2004  . LAPAROSCOPIC INCISIONAL / UMBILICAL / VENTRAL HERNIA REPAIR  2004   "w/gallbladder OR"  . LYMPH NODE BIOPSY N/A 03/26/2016   Procedure: Sentinel LYMPH NODE BIOPSY;  Surgeon: Everitt Amber, MD;  Location: [facility];  Service: Gynecology;  Laterality: N/A;  . ROBOTIC ASSISTED TOTAL HYSTERECTOMY WITH BILATERAL SALPINGO OOPHERECTOMY N/A 03/26/2016   Procedure: XI ROBOTIC ASSISTED TOTAL Laproscopic HYSTERECTOMY WITH BILATERAL SALPINGO OOPHORECTOMY;  Surgeon: Everitt Amber, MD;  Location: [facility];  Service: Gynecology;  Laterality: N/A;  . TOTAL KNEE ARTHROPLASTY Left 10/09/2013   Procedure: LEFT TOTAL KNEE ARTHROPLASTY;  Surgeon: [name], MD;  Location: [facility];  Service: Orthopedics;  Laterality: Left;  . TOTAL KNEE ARTHROPLASTY Right 04/09/2014   Procedure: RIGHT TOTAL KNEE ARTHROPLASTY;  Surgeon: [name], MD;  Location: [facility];  Service: Orthopedics;  Laterality: Right;  . TUBAL LIGATION  1982  . VARICOSE VEIN SURGERY Bilateral 1968  . [place]     OB History   No obstetric history on file.      Home Medications    Prior to Admission medications   Medication Sig Start Date End Date Taking? Authorizing Provider  acetaminophen (TYLENOL) 500 MG tablet Take 1,000 mg by mouth 2 (two) times daily.     [provider]  alendronate (FOSAMAX) 70 MG tablet Take 70 mg by mouth once a week. Take with a full glass of water on an empty stomach.    [provider]  allopurinol (ZYLOPRIM) 300 MG tablet Take 300 mg by mouth daily with breakfast.     [provider]  amoxicillin (AMOXIL) 500 MG capsule  07/27/18   [provider]  calcium gluconate 500 MG tablet Takes 1 tablet in am and 2 in the evening.    [provider]  cetirizine (ZYRTEC) 10 MG tablet Take 10 mg by mouth daily.    [provider]  citalopram (CELEXA) 20 MG tablet  Take 20 mg by mouth daily.  01/10/15   [provider]  Cyanocobalamin (VITAMIN B-12 PO) Take by mouth.    [provider]  Ferrous Sulfate 90 (18 Fe) MG TABS Take 1 tablet by mouth daily.    [provider]  furosemide (LASIX) 20 MG tablet Take 20 mg by mouth daily. 01/10/15   [provider]  HAWTHORNE BERRY PO Take 500 mg by mouth 2 (two) times daily.    [provider]  hydrOXYzine (ATARAX/VISTARIL) 25 MG tablet Take 25 mg by mouth at bedtime.    [provider]  ketoconazole (NIZORAL) 2 % cream  04/12/18   [provider]  levothyroxine (SYNTHROID, LEVOTHROID) 125 MCG tablet Take 125 mcg by mouth daily before breakfast.    [provider]  lisinopril (PRINIVIL,ZESTRIL) 20 MG tablet Take 20 mg by mouth  daily with breakfast.     [provider]  Melatonin 2.5 MG CAPS Take by mouth.    [provider]  Multiple Vitamins-Minerals (MULTIVITAMIN WITH MINERALS) tablet Take 1 tablet by mouth daily.    [provider]  Omega-3 Fatty Acids (FISH OIL PO) Take 1 tablet by mouth daily.    [provider]  omeprazole (PRILOSEC OTC) 20 MG tablet Take 20 mg by mouth daily.    [provider]  omeprazole (PRILOSEC) 20 MG capsule  06/06/18   [provider]  polyethylene glycol (MIRALAX / GLYCOLAX) packet Take 17 g by mouth daily as needed for mild constipation or moderate constipation. 04/12/14   Perkins, Alexzandrew L, PA-C  pravastatin (PRAVACHOL) 10 MG tablet  02/14/18   [provider]  pyridOXINE (VITAMIN B-6) 100 MG tablet Take 100 mg by mouth daily.    [provider]  tolterodine (DETROL LA) 4 MG 24 hr capsule Take 4 mg by mouth daily.    [provider]  traMADol (ULTRAM) 50 MG tablet Take 1-2 tablets (50-100 mg total) by mouth every 6 (six) hours as needed (mild pain). 04/12/14   Perkins, Alexzandrew L, PA-C  vitamin C (ASCORBIC ACID) 500 MG tablet Take 500  mg by mouth daily.    [provider]    Family History Family History  Problem Relation Age of Onset  . Heart disease Mother   . Heart disease Father   . CVA Father     Social History Social History   Tobacco Use  . Smoking status: Never Smoker  . Smokeless tobacco: Never Used  Substance Use Topics  . Alcohol use: No  . Drug use: No     Allergies   Oxybutynin   Review of Systems Review of Systems  Musculoskeletal: Positive for arthralgias.  Skin: Positive for wound.  All other systems reviewed and are negative.    Physical Exam Updated Vital Signs BP 137/64 (BP Location: Left Arm)   Pulse 81   Temp (!) 97.4 F (36.3 C) (Oral)   Resp 17   Ht 5' (1.524 m)   Wt 108.9 kg   SpO2 97%   BMI 46.87 kg/m   Physical Exam Vitals signs and nursing note reviewed.  Constitutional:      Appearance: She is well-developed.  HENT:     Head: Normocephalic and atraumatic.     Comments: 5cm semi-circular laceration above right eyebrow without active bleeding, there is some early bruising noted along the eyebrow and upper eyelid, no tenderness or deformity of the orbital rim; PERRL Eyes:     Conjunctiva/sclera: Conjunctivae normal.     Pupils: Pupils are equal, round, and reactive to light.  Neck:     Musculoskeletal: Normal range of motion.  Cardiovascular:     Rate and Rhythm: Normal rate and regular rhythm.     Heart sounds: Normal heart sounds.  Pulmonary:     Effort: Pulmonary effort is normal.     Breath sounds: Normal breath sounds.  Abdominal:     General: Bowel sounds are normal.     Palpations: Abdomen is soft.  Musculoskeletal: Normal range of motion.     Comments: Tenderness along medial aspect of right shoulder/proximal humerus; slight deformity noted; normal grip strength; radial pulse intact, normal cap refill  Skin:    General: Skin is warm and dry.  Neurological:     Mental Status: She is alert and oriented to person, place, and time.  Comments: AAOx3, answering questions and following commands appropriately; equal strength UE and LE bilaterally; CN grossly intact; limited movement of right arm due to pain, but otherwise normal ROM of extremities, no ataxia noted; no focal neuro deficits or facial asymmetry appreciated      ED Treatments / Results  Labs (all labs ordered are listed, but only abnormal results are displayed) Labs Reviewed - No data to display  EKG None  Radiology Dg Shoulder Right  Result Date: 10/19/2018 CLINICAL DATA:  84 year old female with fall and right shoulder pain. EXAM: RIGHT SHOULDER - 2+ VIEW COMPARISON:  None FINDINGS: Evaluation is limited due to patient's body habitus and soft tissue attenuation. There is a displaced comminuted appearing fracture of the right humeral head with a displaced fracture of the greater tuberosity. There is anterior dislocation of the shoulder. There is an area of airspace opacity in the visualized right lower lung field which may represent a developing infiltrate or a mass. Further evaluation with dedicated chest radiograph or CT is recommended. This may also represent an area of atelectasis due to mass effect caused by hiatal hernia seen on the CT of 10/21/2017. IMPRESSION: 1. Displaced comminuted fracture of the right humeral head with displaced fracture of the greater tuberosity. There is anterior dislocation of the right shoulder. 2. Focal area of airspace opacity in the visualized right lower lung field. Further evaluation with dedicated chest radiograph or CT is recommended. Electronically Signed   By: Anner Crete M.D.   On: 10/19/2018 00:08   Ct Head Wo Contrast  Result Date: 10/19/2018 CLINICAL DATA:  84 year old female with fall. EXAM: CT HEAD WITHOUT CONTRAST CT CERVICAL SPINE WITHOUT CONTRAST TECHNIQUE: Multidetector CT imaging of the head and cervical spine was performed following the standard protocol without intravenous contrast. Multiplanar CT image  reconstructions of the cervical spine were also generated. COMPARISON:  None. FINDINGS: CT HEAD FINDINGS Brain: There is mild age-related atrophy and chronic microvascular ischemic changes. There is no acute intracranial hemorrhage. No mass effect or midline shift. No extra-axial fluid collection. Vascular: No hyperdense vessel or unexpected calcification. Skull: Normal. Negative for fracture or focal lesion. Sinuses/Orbits: No acute finding. Other: None CT CERVICAL SPINE FINDINGS Alignment: No acute subluxation. Skull base and vertebrae: No acute fracture. Osteopenia. Soft tissues and spinal canal: No prevertebral fluid or swelling. No visible canal hematoma. Disc levels:  Multilevel degenerative changes. Upper chest: Mild emphysema. Atherosclerotic calcification of the aortic arch. Other: Bilateral carotid bulb calcified plaques. IMPRESSION: 1. No acute intracranial hemorrhage. 2. No acute/traumatic cervical spine pathology. Electronically Signed   By: Anner Crete M.D.   On: 10/19/2018 00:38   Ct Cervical Spine Wo Contrast  Result Date: 10/19/2018 CLINICAL DATA:  84 year old female with fall. EXAM: CT HEAD WITHOUT CONTRAST CT CERVICAL SPINE WITHOUT CONTRAST TECHNIQUE: Multidetector CT imaging of the head and cervical spine was performed following the standard protocol without intravenous contrast. Multiplanar CT image reconstructions of the cervical spine were also generated. COMPARISON:  None. FINDINGS: CT HEAD FINDINGS Brain: There is mild age-related atrophy and chronic microvascular ischemic changes. There is no acute intracranial hemorrhage. No mass effect or midline shift. No extra-axial fluid collection. Vascular: No hyperdense vessel or unexpected calcification. Skull: Normal. Negative for fracture or focal lesion. Sinuses/Orbits: No acute finding. Other: None CT CERVICAL SPINE FINDINGS Alignment: No acute subluxation. Skull base and vertebrae: No acute fracture. Osteopenia. Soft tissues and  spinal canal: No prevertebral fluid or swelling. No visible canal hematoma. Disc levels:  Multilevel degenerative  changes. Upper chest: Mild emphysema. Atherosclerotic calcification of the aortic arch. Other: Bilateral carotid bulb calcified plaques. IMPRESSION: 1. No acute intracranial hemorrhage. 2. No acute/traumatic cervical spine pathology. Electronically Signed   By: Anner Crete M.D.   On: 10/19/2018 00:38   Ct Shoulder Right Wo Contrast  Result Date: 10/19/2018 CLINICAL DATA:  84 year old female with right shoulder fracture dislocation. EXAM: CT OF THE UPPER RIGHT EXTREMITY WITHOUT CONTRAST TECHNIQUE: Multidetector CT imaging of the upper right extremity was performed according to the standard protocol. COMPARISON:  Radiograph dated 10/18/2018 FINDINGS: Bones/Joint/Cartilage There is a comminuted and displaced fracture of the right humeral head with impaction of the humeral shaft at the neck. There may be slight anterior subluxation of the right shoulder. There is nondisplaced fracture of the posterior glenoid. The bones are osteopenic. There is a large amount of blood in the joint. Ligaments Suboptimally assessed by CT. Muscles and Tendons There is edema of the rotator cuff musculature. Soft tissues There is stranding and edema of the soft tissues and fat in the region of the right axilla. IMPRESSION: 1. Displaced comminuted fracture of the right humeral head with impaction at the neck. 2. Nondisplaced fracture of the posterior glenoid. 3. Probable mild anterior subluxation of the right shoulder. 4. Large amount of blood within the right shoulder joint. Electronically Signed   By: Anner Crete M.D.   On: 10/19/2018 03:00    Procedures Procedures (including critical care time)  LACERATION REPAIR Performed by: Larene Pickett Authorized by: Larene Pickett Consent: Verbal consent obtained. Risks and benefits: risks, benefits and alternatives were discussed Consent given by:  patient Patient identity confirmed: provided demographic data Prepped and Draped in normal sterile fashion Wound explored  Laceration Location: right forehead  Laceration Length: 5cm  No Foreign Bodies seen or palpated  Anesthesia: local infiltration  Local anesthetic: lidocaine 1% without epinephrine  Anesthetic total: 5 ml  Irrigation method: syringe Amount of cleaning: standard  Skin closure: 5-0 prolene  Number of sutures: 7  Technique: simple interrupted  Patient tolerance: Patient tolerated the procedure well with no immediate complications.   Medications Ordered in ED Medications  oxyCODONE-acetaminophen (PERCOCET/ROXICET) 5-325 MG per tablet 1 tablet (1 tablet Oral Given 10/19/18 0033)  lidocaine (PF) (XYLOCAINE) 1 % injection 5 mL (5 mLs Intradermal Given 10/19/18 0243)     Initial Impression / Assessment and Plan / ED Course  I have reviewed the triage vital signs and the nursing notes.  Pertinent labs & imaging results that were available during my care of the patient were reviewed by me and considered in my medical decision making (see chart for details).  84 y.o. F here after a mechanical fall at home.  She tripped over a step, fell onto right shoulder and struck her head in the hardwood floor.  No LOC.  Here she is awake, alert, peripherally oriented to her baseline.  Has laceration above her right eyebrow but no apparent facial deformities.  Also complains of pain in the right shoulder, mostly along the medial shoulder and proximal humerus.  Her arm is neurovascularly intact.  Does have some tenderness along the cervical spine but no appreciable deformity.  She is not currently on anticoagulation.  We will plan for CT head and cervical spine as well as plain films of the right shoulder.  Given Percocet for pain.  CT head and cervical spine negative.  C-collar was removed, ranging neck without difficulty.  Shoulder films with comminuted fracture of the humeral  head with large fragment of the greater tuberosity inside the joint along with anterior dislocation.  There is mention of RLL opacity, however patient without cough, fever, chest pain, SOB, or other other recent URI symptoms.  Patient has seen Dr. Wynelle Link with [facility] in the past.  Will consult on-call physician for recommendations.  Discussed with Dr. Stann Mainland-- has reviewed films, feels likely surgical neck fracture with displacement of humeral head fragments.  Does not feel there is much utility in trying to reduce here as likely will not stay in place.  Recommends getting CT of shoulder for further detailing, plan for OP follow-up with him later this week for operative planning.  Immobilize in sling, pain control.    Laceration repaired as above, patient tolerated well.  Tetanus UTD.  CT shoulder obtained.  Discharge home with close OP follow-up with orthopedics.  Tolerated percocet well here in ED, will d/c home with same for pain control.  Will also need suture removal in 7-20 days with PCP.   Return here for any new/acute changes.    Final Clinical Impressions(s) / ED Diagnoses   Final diagnoses:  Fall, initial encounter  Closed 2-part displaced fracture of surgical neck of right humerus, initial encounter  Dislocation of right shoulder joint, initial encounter    ED Discharge Orders         Ordered    oxyCODONE-acetaminophen (PERCOCET) 5-325 MG tablet  Every 4 hours PRN     10/19/18 0227           Larene Pickett, PA-C 10/19/18 2694    Varney Biles, MD 10/19/18 [phone]

## 2018-10-19 ENCOUNTER — Emergency Department (HOSPITAL_COMMUNITY): Payer: Medicare Other

## 2018-10-19 DIAGNOSIS — S42201A Unspecified fracture of upper end of right humerus, initial encounter for closed fracture: Secondary | ICD-10-CM | POA: Diagnosis not present

## 2018-10-19 DIAGNOSIS — S0990XA Unspecified injury of head, initial encounter: Secondary | ICD-10-CM | POA: Diagnosis not present

## 2018-10-19 DIAGNOSIS — S42221A 2-part displaced fracture of surgical neck of right humerus, initial encounter for closed fracture: Secondary | ICD-10-CM | POA: Diagnosis not present

## 2018-10-19 DIAGNOSIS — S199XXA Unspecified injury of neck, initial encounter: Secondary | ICD-10-CM | POA: Diagnosis not present

## 2018-10-19 MED ORDER — LIDOCAINE HCL (PF) 1 % IJ SOLN
5.0000 mL | Freq: Once | INTRAMUSCULAR | Status: AC
  Administered 2018-10-19: 5 mL via INTRADERMAL
  Filled 2018-10-19: qty 30

## 2018-10-19 MED ORDER — OXYCODONE-ACETAMINOPHEN 5-325 MG PO TABS: 1.0000 | ORAL_TABLET | ORAL | 0 refills | Status: DC | PRN

## 2018-10-19 NOTE — ED Notes (Signed)
Pt to imaging

## 2018-10-19 NOTE — Discharge Instructions (Signed)
Take the prescribed medication as directed. Follow-up with Dr. Stann Mainland-- call tomorrow to schedule appt. Return to the ED for new or worsening symptoms.

## 2018-10-19 NOTE — ED Notes (Signed)
Patient unable to sign due to arm being in sling. Gave this RN permission to sign for discharge.

## 2018-10-20 ENCOUNTER — Telehealth: Payer: Self-pay | Admitting: *Deleted

## 2018-10-20 NOTE — Telephone Encounter (Signed)
Called patient to reschedule her fu due to falling, she wants to wait until she has her surgery to call back and reschedule this fu

## 2018-10-21 DIAGNOSIS — S42201A Unspecified fracture of upper end of right humerus, initial encounter for closed fracture: Secondary | ICD-10-CM | POA: Diagnosis not present

## 2018-10-24 ENCOUNTER — Ambulatory Visit: Payer: Medicare Other | Admitting: Radiation Oncology

## 2018-10-24 ENCOUNTER — Encounter (HOSPITAL_COMMUNITY): Payer: Self-pay | Admitting: *Deleted

## 2018-10-24 ENCOUNTER — Other Ambulatory Visit: Payer: Self-pay

## 2018-10-24 NOTE — Progress Notes (Signed)
Ms Melinda Kerr denies chest pain, she does get short of breath with activity, "cause I'm over weight." Ms Melinda Kerr has type II diabetes, diet controlled. Fasting CBGs run in the low 100's. I instructed patient to check CBG after awaking and every 2 hours until arrival  to the hospital.  I Instructed patient if CBG is less than 70 to drink 1/2 cup of a clear juice. Recheck CBG in 15 minutes then call pre- op desk at [phone] for further instructions.

## 2018-10-26 ENCOUNTER — Inpatient Hospital Stay (HOSPITAL_COMMUNITY)
Admission: RE | Admit: 2018-10-26 | Discharge: 2018-10-31 | DRG: 483 | Disposition: A | Discharge status: Skilled Nursing Facility | Payer: Medicare Other | Attending: Orthopedic Surgery | Admitting: Orthopedic Surgery

## 2018-10-26 ENCOUNTER — Inpatient Hospital Stay (HOSPITAL_COMMUNITY): Payer: Medicare Other | Admitting: Anesthesiology

## 2018-10-26 ENCOUNTER — Inpatient Hospital Stay (HOSPITAL_COMMUNITY): Payer: Medicare Other

## 2018-10-26 ENCOUNTER — Encounter (HOSPITAL_COMMUNITY): Payer: Self-pay | Admitting: Surgery

## 2018-10-26 ENCOUNTER — Other Ambulatory Visit: Payer: Self-pay

## 2018-10-26 ENCOUNTER — Encounter (HOSPITAL_COMMUNITY)
Admission: RE | Disposition: A | Discharge status: Skilled Nursing Facility | Payer: Self-pay | Source: Home / Self Care | Attending: Orthopedic Surgery

## 2018-10-26 DIAGNOSIS — N3281 Overactive bladder: Secondary | ICD-10-CM | POA: Diagnosis present

## 2018-10-26 DIAGNOSIS — Z8542 Personal history of malignant neoplasm of other parts of uterus: Secondary | ICD-10-CM

## 2018-10-26 DIAGNOSIS — E119 Type 2 diabetes mellitus without complications: Secondary | ICD-10-CM | POA: Diagnosis present

## 2018-10-26 DIAGNOSIS — Z90710 Acquired absence of both cervix and uterus: Secondary | ICD-10-CM | POA: Diagnosis not present

## 2018-10-26 DIAGNOSIS — R2681 Unsteadiness on feet: Secondary | ICD-10-CM | POA: Diagnosis not present

## 2018-10-26 DIAGNOSIS — R0602 Shortness of breath: Secondary | ICD-10-CM | POA: Diagnosis not present

## 2018-10-26 DIAGNOSIS — Z7983 Long term (current) use of bisphosphonates: Secondary | ICD-10-CM

## 2018-10-26 DIAGNOSIS — M25511 Pain in right shoulder: Secondary | ICD-10-CM | POA: Diagnosis not present

## 2018-10-26 DIAGNOSIS — Z823 Family history of stroke: Secondary | ICD-10-CM

## 2018-10-26 DIAGNOSIS — D649 Anemia, unspecified: Secondary | ICD-10-CM | POA: Diagnosis not present

## 2018-10-26 DIAGNOSIS — Z9842 Cataract extraction status, left eye: Secondary | ICD-10-CM

## 2018-10-26 DIAGNOSIS — S42201D Unspecified fracture of upper end of right humerus, subsequent encounter for fracture with routine healing: Secondary | ICD-10-CM | POA: Diagnosis not present

## 2018-10-26 DIAGNOSIS — G8918 Other acute postprocedural pain: Secondary | ICD-10-CM | POA: Diagnosis not present

## 2018-10-26 DIAGNOSIS — Z9989 Dependence on other enabling machines and devices: Secondary | ICD-10-CM

## 2018-10-26 DIAGNOSIS — T8189XA Other complications of procedures, not elsewhere classified, initial encounter: Secondary | ICD-10-CM | POA: Diagnosis not present

## 2018-10-26 DIAGNOSIS — I959 Hypotension, unspecified: Secondary | ICD-10-CM | POA: Diagnosis not present

## 2018-10-26 DIAGNOSIS — Z471 Aftercare following joint replacement surgery: Secondary | ICD-10-CM | POA: Diagnosis not present

## 2018-10-26 DIAGNOSIS — Z6841 Body Mass Index (BMI) 40.0 and over, adult: Secondary | ICD-10-CM

## 2018-10-26 DIAGNOSIS — S42201A Unspecified fracture of upper end of right humerus, initial encounter for closed fracture: Secondary | ICD-10-CM | POA: Insufficient documentation

## 2018-10-26 DIAGNOSIS — I110 Hypertensive heart disease with heart failure: Secondary | ICD-10-CM | POA: Diagnosis not present

## 2018-10-26 DIAGNOSIS — Z79890 Hormone replacement therapy (postmenopausal): Secondary | ICD-10-CM

## 2018-10-26 DIAGNOSIS — S42291A Other displaced fracture of upper end of right humerus, initial encounter for closed fracture: Secondary | ICD-10-CM | POA: Diagnosis not present

## 2018-10-26 DIAGNOSIS — Z7401 Bed confinement status: Secondary | ICD-10-CM | POA: Diagnosis not present

## 2018-10-26 DIAGNOSIS — Z9841 Cataract extraction status, right eye: Secondary | ICD-10-CM | POA: Diagnosis not present

## 2018-10-26 DIAGNOSIS — K219 Gastro-esophageal reflux disease without esophagitis: Secondary | ICD-10-CM | POA: Diagnosis present

## 2018-10-26 DIAGNOSIS — Z96611 Presence of right artificial shoulder joint: Secondary | ICD-10-CM

## 2018-10-26 DIAGNOSIS — J9811 Atelectasis: Secondary | ICD-10-CM | POA: Diagnosis not present

## 2018-10-26 DIAGNOSIS — J309 Allergic rhinitis, unspecified: Secondary | ICD-10-CM | POA: Diagnosis not present

## 2018-10-26 DIAGNOSIS — G4733 Obstructive sleep apnea (adult) (pediatric): Secondary | ICD-10-CM

## 2018-10-26 DIAGNOSIS — Z923 Personal history of irradiation: Secondary | ICD-10-CM | POA: Diagnosis not present

## 2018-10-26 DIAGNOSIS — M545 Low back pain: Secondary | ICD-10-CM | POA: Diagnosis not present

## 2018-10-26 DIAGNOSIS — M109 Gout, unspecified: Secondary | ICD-10-CM | POA: Diagnosis present

## 2018-10-26 DIAGNOSIS — M2550 Pain in unspecified joint: Secondary | ICD-10-CM | POA: Diagnosis not present

## 2018-10-26 DIAGNOSIS — R0609 Other forms of dyspnea: Secondary | ICD-10-CM | POA: Diagnosis present

## 2018-10-26 DIAGNOSIS — Z96653 Presence of artificial knee joint, bilateral: Secondary | ICD-10-CM | POA: Diagnosis present

## 2018-10-26 DIAGNOSIS — I5032 Chronic diastolic (congestive) heart failure: Secondary | ICD-10-CM | POA: Diagnosis present

## 2018-10-26 DIAGNOSIS — G8929 Other chronic pain: Secondary | ICD-10-CM | POA: Diagnosis not present

## 2018-10-26 DIAGNOSIS — R092 Respiratory arrest: Secondary | ICD-10-CM | POA: Diagnosis not present

## 2018-10-26 DIAGNOSIS — E039 Hypothyroidism, unspecified: Secondary | ICD-10-CM | POA: Diagnosis present

## 2018-10-26 DIAGNOSIS — Y92019 Unspecified place in single-family (private) house as the place of occurrence of the external cause: Secondary | ICD-10-CM | POA: Diagnosis not present

## 2018-10-26 DIAGNOSIS — E785 Hyperlipidemia, unspecified: Secondary | ICD-10-CM | POA: Diagnosis not present

## 2018-10-26 DIAGNOSIS — K449 Diaphragmatic hernia without obstruction or gangrene: Secondary | ICD-10-CM | POA: Diagnosis present

## 2018-10-26 DIAGNOSIS — R0600 Dyspnea, unspecified: Secondary | ICD-10-CM

## 2018-10-26 DIAGNOSIS — D62 Acute posthemorrhagic anemia: Secondary | ICD-10-CM | POA: Diagnosis not present

## 2018-10-26 DIAGNOSIS — E539 Vitamin B deficiency, unspecified: Secondary | ICD-10-CM | POA: Diagnosis not present

## 2018-10-26 DIAGNOSIS — F329 Major depressive disorder, single episode, unspecified: Secondary | ICD-10-CM | POA: Diagnosis present

## 2018-10-26 DIAGNOSIS — T889XXA Complication of surgical and medical care, unspecified, initial encounter: Secondary | ICD-10-CM | POA: Diagnosis not present

## 2018-10-26 DIAGNOSIS — M6281 Muscle weakness (generalized): Secondary | ICD-10-CM | POA: Diagnosis not present

## 2018-10-26 DIAGNOSIS — I10 Essential (primary) hypertension: Secondary | ICD-10-CM | POA: Diagnosis present

## 2018-10-26 DIAGNOSIS — Z8249 Family history of ischemic heart disease and other diseases of the circulatory system: Secondary | ICD-10-CM | POA: Diagnosis not present

## 2018-10-26 DIAGNOSIS — Z9049 Acquired absence of other specified parts of digestive tract: Secondary | ICD-10-CM

## 2018-10-26 DIAGNOSIS — Z9221 Personal history of antineoplastic chemotherapy: Secondary | ICD-10-CM | POA: Diagnosis not present

## 2018-10-26 DIAGNOSIS — J9601 Acute respiratory failure with hypoxia: Secondary | ICD-10-CM | POA: Diagnosis not present

## 2018-10-26 DIAGNOSIS — W1830XA Fall on same level, unspecified, initial encounter: Secondary | ICD-10-CM | POA: Diagnosis present

## 2018-10-26 DIAGNOSIS — G4700 Insomnia, unspecified: Secondary | ICD-10-CM | POA: Diagnosis not present

## 2018-10-26 DIAGNOSIS — F3289 Other specified depressive episodes: Secondary | ICD-10-CM | POA: Diagnosis not present

## 2018-10-26 DIAGNOSIS — Z885 Allergy status to narcotic agent status: Secondary | ICD-10-CM

## 2018-10-26 DIAGNOSIS — R41841 Cognitive communication deficit: Secondary | ICD-10-CM | POA: Diagnosis not present

## 2018-10-26 DIAGNOSIS — Z741 Need for assistance with personal care: Secondary | ICD-10-CM | POA: Diagnosis not present

## 2018-10-26 HISTORY — PX: REVERSE SHOULDER ARTHROPLASTY: SHX5054

## 2018-10-26 HISTORY — DX: Dyspnea, unspecified: R06.00

## 2018-10-26 LAB — BASIC METABOLIC PANEL
Anion gap: 9 (ref 5–15)
BUN: 19 mg/dL (ref 8–23)
CO2: 27 mmol/L (ref 22–32)
Calcium: 8.7 mg/dL — ABNORMAL LOW (ref 8.9–10.3)
Chloride: 102 mmol/L (ref 98–111)
Creatinine, Ser: 0.8 mg/dL (ref 0.44–1.00)
GFR calc Af Amer: >60 mL/min (ref >60)
GLUCOSE: 121 mg/dL — AB (ref 70–99)
Potassium: 4.4 mmol/L (ref 3.5–5.1)
Sodium: 138 mmol/L (ref 135–145)

## 2018-10-26 LAB — CBC
HCT: 34.4 % — ABNORMAL LOW (ref 36.0–46.0)
Hemoglobin: 10.7 g/dL — ABNORMAL LOW (ref 12.0–15.0)
MCH: 31 pg (ref 26.0–34.0)
MCHC: 31.1 g/dL (ref 30.0–36.0)
MCV: 99.7 fL (ref 80.0–100.0)
Platelets: 305 10*3/uL (ref 150–400)
RBC: 3.45 MIL/uL — ABNORMAL LOW (ref 3.87–5.11)
RDW: 14.2 % (ref 11.5–15.5)
WBC: 7.6 10*3/uL (ref 4.0–10.5)
nRBC: 0 % (ref 0.0–0.2)

## 2018-10-26 LAB — TYPE AND SCREEN
ABO/RH(D): A NEG
Antibody Screen: NEGATIVE
Weak D: POSITIVE

## 2018-10-26 LAB — HEMOGLOBIN A1C
Hgb A1c MFr Bld: 6.2 % — ABNORMAL HIGH (ref 4.8–5.6)
Mean Plasma Glucose: 131.24 mg/dL

## 2018-10-26 LAB — ABO/RH: ABO/RH(D): A NEG

## 2018-10-26 LAB — GLUCOSE, CAPILLARY
Glucose-Capillary: 93 mg/dL (ref 70–99)
Glucose-Capillary: 121 mg/dL — ABNORMAL HIGH (ref 70–99)

## 2018-10-26 SURGERY — ARTHROPLASTY, SHOULDER, TOTAL, REVERSE
Anesthesia: General | Site: Shoulder | Laterality: Right

## 2018-10-26 MED ORDER — FENTANYL CITRATE (PF) 250 MCG/5ML IJ SOLN
INTRAMUSCULAR | Status: AC
  Filled 2018-10-26: qty 5

## 2018-10-26 MED ORDER — CHLORHEXIDINE GLUCONATE 4 % EX LIQD: 60.0000 mL | Freq: Once | CUTANEOUS | Status: DC

## 2018-10-26 MED ORDER — EPHEDRINE SULFATE 50 MG/ML IJ SOLN
INTRAMUSCULAR | Status: DC | PRN
  Administered 2018-10-26 (×2): 10 mg via INTRAVENOUS
  Administered 2018-10-26: 15 mg via INTRAVENOUS

## 2018-10-26 MED ORDER — VITAMIN C 500 MG PO TABS
500.0000 mg | ORAL_TABLET | Freq: Every day | ORAL | Status: DC
  Administered 2018-10-27 – 2018-10-31 (×5): 500 mg via ORAL
  Filled 2018-10-26 (×5): qty 1

## 2018-10-26 MED ORDER — OMEPRAZOLE MAGNESIUM 20 MG PO TBEC: 20.0000 mg | DELAYED_RELEASE_TABLET | Freq: Every day | ORAL | Status: DC

## 2018-10-26 MED ORDER — SUGAMMADEX SODIUM 200 MG/2ML IV SOLN: INTRAVENOUS | Status: DC | PRN

## 2018-10-26 MED ORDER — VANCOMYCIN HCL 1000 MG IV SOLR
INTRAVENOUS | Status: AC
  Filled 2018-10-26: qty 1000

## 2018-10-26 MED ORDER — MORPHINE SULFATE (PF) 2 MG/ML IV SOLN: 0.5000 mg | INTRAVENOUS | Status: DC | PRN

## 2018-10-26 MED ORDER — ALBUTEROL SULFATE (2.5 MG/3ML) 0.083% IN NEBU
2.5000 mg | INHALATION_SOLUTION | RESPIRATORY_TRACT | Status: DC | PRN
  Administered 2018-10-26 – 2018-10-28 (×5): 2.5 mg via RESPIRATORY_TRACT
  Filled 2018-10-26 (×5): qty 3

## 2018-10-26 MED ORDER — SODIUM CHLORIDE 0.9 % IV SOLN
INTRAVENOUS | Status: DC | PRN
  Administered 2018-10-26: 25 ug/min via INTRAVENOUS

## 2018-10-26 MED ORDER — TRAMADOL HCL 50 MG PO TABS
50.0000 mg | ORAL_TABLET | Freq: Four times a day (QID) | ORAL | Status: DC
  Administered 2018-10-26 – 2018-10-31 (×18): 50 mg via ORAL
  Filled 2018-10-26 (×18): qty 1

## 2018-10-26 MED ORDER — BUPIVACAINE HCL (PF) 0.5 % IJ SOLN
INTRAMUSCULAR | Status: DC | PRN
  Administered 2018-10-26: 15 mL via PERINEURAL

## 2018-10-26 MED ORDER — FESOTERODINE FUMARATE ER 4 MG PO TB24
4.0000 mg | ORAL_TABLET | Freq: Every day | ORAL | Status: DC
  Administered 2018-10-26 – 2018-10-31 (×6): 4 mg via ORAL
  Filled 2018-10-26 (×6): qty 1

## 2018-10-26 MED ORDER — PROPOFOL 10 MG/ML IV BOLUS
INTRAVENOUS | Status: DC | PRN
  Administered 2018-10-26: 100 mg via INTRAVENOUS

## 2018-10-26 MED ORDER — FENTANYL CITRATE (PF) 100 MCG/2ML IJ SOLN
INTRAMUSCULAR | Status: DC | PRN
  Administered 2018-10-26 (×2): 25 ug via INTRAVENOUS
  Administered 2018-10-26: 50 ug via INTRAVENOUS
  Administered 2018-10-26: 25 ug via INTRAVENOUS
  Administered 2018-10-26: 50 ug via INTRAVENOUS
  Administered 2018-10-26: 25 ug via INTRAVENOUS
  Administered 2018-10-26: 50 ug via INTRAVENOUS

## 2018-10-26 MED ORDER — ONDANSETRON HCL 4 MG/2ML IJ SOLN: 4.0000 mg | Freq: Four times a day (QID) | INTRAMUSCULAR | Status: DC | PRN

## 2018-10-26 MED ORDER — ENOXAPARIN SODIUM 30 MG/0.3ML ~~LOC~~ SOLN
30.0000 mg | SUBCUTANEOUS | Status: DC
  Administered 2018-10-27 – 2018-10-31 (×5): 30 mg via SUBCUTANEOUS
  Filled 2018-10-26 (×5): qty 0.3

## 2018-10-26 MED ORDER — LIDOCAINE 2% (20 MG/ML) 5 ML SYRINGE
INTRAMUSCULAR | Status: AC
  Filled 2018-10-26: qty 5

## 2018-10-26 MED ORDER — DEXAMETHASONE SODIUM PHOSPHATE 10 MG/ML IJ SOLN
INTRAMUSCULAR | Status: DC | PRN
  Administered 2018-10-26: 5 mg via INTRAVENOUS

## 2018-10-26 MED ORDER — VITAMIN B-6 100 MG PO TABS
100.0000 mg | ORAL_TABLET | Freq: Every day | ORAL | Status: DC
  Administered 2018-10-26 – 2018-10-31 (×6): 100 mg via ORAL
  Filled 2018-10-26 (×6): qty 1

## 2018-10-26 MED ORDER — EPHEDRINE 5 MG/ML INJ
INTRAVENOUS | Status: AC
  Filled 2018-10-26: qty 10

## 2018-10-26 MED ORDER — EPINEPHRINE PF 1 MG/ML IJ SOLN
INTRAMUSCULAR | Status: AC
  Filled 2018-10-26: qty 1

## 2018-10-26 MED ORDER — ALBUTEROL SULFATE (2.5 MG/3ML) 0.083% IN NEBU
INHALATION_SOLUTION | RESPIRATORY_TRACT | Status: AC
  Filled 2018-10-26: qty 3

## 2018-10-26 MED ORDER — PANTOPRAZOLE SODIUM 40 MG PO TBEC
40.0000 mg | DELAYED_RELEASE_TABLET | Freq: Every day | ORAL | Status: DC
  Administered 2018-10-26 – 2018-10-31 (×6): 40 mg via ORAL
  Filled 2018-10-26 (×6): qty 1

## 2018-10-26 MED ORDER — BUPIVACAINE LIPOSOME 1.3 % IJ SUSP
INTRAMUSCULAR | Status: DC | PRN
  Administered 2018-10-26: 10 mL

## 2018-10-26 MED ORDER — ALBUTEROL SULFATE (2.5 MG/3ML) 0.083% IN NEBU
2.5000 mg | INHALATION_SOLUTION | Freq: Once | RESPIRATORY_TRACT | Status: AC
  Administered 2018-10-26: 2.5 mg via RESPIRATORY_TRACT

## 2018-10-26 MED ORDER — ACETAMINOPHEN 325 MG PO TABS
325.0000 mg | ORAL_TABLET | Freq: Four times a day (QID) | ORAL | Status: DC | PRN
  Administered 2018-10-30: 650 mg via ORAL
  Administered 2018-10-31: 325 mg via ORAL
  Filled 2018-10-26: qty 2
  Filled 2018-10-26 (×2): qty 1

## 2018-10-26 MED ORDER — ACETAMINOPHEN 500 MG PO TABS
500.0000 mg | ORAL_TABLET | Freq: Four times a day (QID) | ORAL | Status: AC
  Administered 2018-10-26 – 2018-10-27 (×3): 500 mg via ORAL
  Filled 2018-10-26 (×3): qty 1

## 2018-10-26 MED ORDER — LORATADINE 10 MG PO TABS
10.0000 mg | ORAL_TABLET | Freq: Every day | ORAL | Status: DC
  Administered 2018-10-26 – 2018-10-31 (×6): 10 mg via ORAL
  Filled 2018-10-26 (×6): qty 1

## 2018-10-26 MED ORDER — DEXAMETHASONE SODIUM PHOSPHATE 10 MG/ML IJ SOLN
INTRAMUSCULAR | Status: AC
  Filled 2018-10-26: qty 1

## 2018-10-26 MED ORDER — CALCIUM CARBONATE 1250 (500 CA) MG PO TABS
1.0000 | ORAL_TABLET | Freq: Two times a day (BID) | ORAL | Status: DC
  Administered 2018-10-27 – 2018-10-29 (×5): 500 mg via ORAL
  Filled 2018-10-26 (×6): qty 1

## 2018-10-26 MED ORDER — ONDANSETRON HCL 4 MG/2ML IJ SOLN
INTRAMUSCULAR | Status: DC | PRN
  Administered 2018-10-26: 4 mg via INTRAVENOUS

## 2018-10-26 MED ORDER — POLYETHYLENE GLYCOL 3350 17 G PO PACK
17.0000 g | PACK | Freq: Every day | ORAL | Status: DC | PRN
  Administered 2018-10-27: 17 g via ORAL
  Filled 2018-10-26: qty 1

## 2018-10-26 MED ORDER — CITALOPRAM HYDROBROMIDE 20 MG PO TABS
20.0000 mg | ORAL_TABLET | Freq: Every day | ORAL | Status: DC
  Administered 2018-10-27 – 2018-10-31 (×5): 20 mg via ORAL
  Filled 2018-10-26 (×6): qty 1

## 2018-10-26 MED ORDER — NAPROXEN 250 MG PO TABS
250.0000 mg | ORAL_TABLET | Freq: Two times a day (BID) | ORAL | Status: DC
  Administered 2018-10-27 – 2018-10-30 (×7): 250 mg via ORAL
  Filled 2018-10-26 (×8): qty 1

## 2018-10-26 MED ORDER — VANCOMYCIN HCL 1000 MG IV SOLR
INTRAVENOUS | Status: DC | PRN
  Administered 2018-10-26: 1000 mg via TOPICAL

## 2018-10-26 MED ORDER — FUROSEMIDE 20 MG PO TABS
20.0000 mg | ORAL_TABLET | Freq: Every day | ORAL | Status: DC
  Administered 2018-10-26 – 2018-10-31 (×6): 20 mg via ORAL
  Filled 2018-10-26 (×6): qty 1

## 2018-10-26 MED ORDER — HYDROCODONE-ACETAMINOPHEN 5-325 MG PO TABS: 1.0000 | ORAL_TABLET | ORAL | Status: DC | PRN

## 2018-10-26 MED ORDER — BUPIVACAINE HCL (PF) 0.25 % IJ SOLN
INTRAMUSCULAR | Status: AC
  Filled 2018-10-26: qty 30

## 2018-10-26 MED ORDER — SUGAMMADEX SODIUM 200 MG/2ML IV SOLN
INTRAVENOUS | Status: DC | PRN
  Administered 2018-10-26: 225.8 mg via INTRAVENOUS

## 2018-10-26 MED ORDER — HYDROCODONE-ACETAMINOPHEN 7.5-325 MG PO TABS: 1.0000 | ORAL_TABLET | ORAL | Status: DC | PRN

## 2018-10-26 MED ORDER — MELATONIN 3 MG PO TABS
3.0000 mg | ORAL_TABLET | Freq: Every day | ORAL | Status: DC
  Administered 2018-10-26 – 2018-10-30 (×5): 3 mg via ORAL
  Filled 2018-10-26 (×6): qty 1

## 2018-10-26 MED ORDER — ALLOPURINOL 300 MG PO TABS
300.0000 mg | ORAL_TABLET | Freq: Every day | ORAL | Status: DC
  Administered 2018-10-27 – 2018-10-31 (×5): 300 mg via ORAL
  Filled 2018-10-26 (×5): qty 1

## 2018-10-26 MED ORDER — ROCURONIUM BROMIDE 50 MG/5ML IV SOSY
PREFILLED_SYRINGE | INTRAVENOUS | Status: AC
  Filled 2018-10-26: qty 5

## 2018-10-26 MED ORDER — LACTATED RINGERS IV SOLN
INTRAVENOUS | Status: DC
  Administered 2018-10-26: 14:00:00 via INTRAVENOUS

## 2018-10-26 MED ORDER — MENTHOL 3 MG MT LOZG: 1.0000 | LOZENGE | OROMUCOSAL | Status: DC | PRN

## 2018-10-26 MED ORDER — DOCUSATE SODIUM 100 MG PO CAPS
100.0000 mg | ORAL_CAPSULE | Freq: Two times a day (BID) | ORAL | Status: DC
  Administered 2018-10-26 – 2018-10-31 (×10): 100 mg via ORAL
  Filled 2018-10-26 (×10): qty 1

## 2018-10-26 MED ORDER — TRANEXAMIC ACID-NACL 1000-0.7 MG/100ML-% IV SOLN
1000.0000 mg | INTRAVENOUS | Status: AC
  Administered 2018-10-26: 1000 mg via INTRAVENOUS

## 2018-10-26 MED ORDER — FENTANYL CITRATE (PF) 100 MCG/2ML IJ SOLN
INTRAMUSCULAR | Status: AC
  Administered 2018-10-26: 50 ug via INTRAVENOUS
  Filled 2018-10-26: qty 2

## 2018-10-26 MED ORDER — ROCURONIUM BROMIDE 100 MG/10ML IV SOLN
INTRAVENOUS | Status: DC | PRN
  Administered 2018-10-26: 50 mg via INTRAVENOUS

## 2018-10-26 MED ORDER — VITAMIN B-12 100 MCG PO TABS
100.0000 ug | ORAL_TABLET | Freq: Every day | ORAL | Status: DC
  Administered 2018-10-26 – 2018-10-31 (×6): 100 ug via ORAL
  Filled 2018-10-26 (×6): qty 1

## 2018-10-26 MED ORDER — PHENYLEPHRINE HCL 10 MG/ML IJ SOLN
INTRAMUSCULAR | Status: DC | PRN
  Administered 2018-10-26 (×2): 120 ug via INTRAVENOUS
  Administered 2018-10-26 (×7): 80 ug via INTRAVENOUS

## 2018-10-26 MED ORDER — TRANEXAMIC ACID-NACL 1000-0.7 MG/100ML-% IV SOLN
INTRAVENOUS | Status: AC
  Filled 2018-10-26: qty 100

## 2018-10-26 MED ORDER — LISINOPRIL 20 MG PO TABS
20.0000 mg | ORAL_TABLET | Freq: Every day | ORAL | Status: DC
  Administered 2018-10-28 – 2018-10-31 (×4): 20 mg via ORAL
  Filled 2018-10-26 (×5): qty 1

## 2018-10-26 MED ORDER — METOCLOPRAMIDE HCL 5 MG/ML IJ SOLN: 5.0000 mg | Freq: Three times a day (TID) | INTRAMUSCULAR | Status: DC | PRN

## 2018-10-26 MED ORDER — PHENOL 1.4 % MT LIQD
1.0000 | OROMUCOSAL | Status: DC | PRN
  Administered 2018-10-27 (×2): 1 via OROMUCOSAL

## 2018-10-26 MED ORDER — PHENYLEPHRINE 40 MCG/ML (10ML) SYRINGE FOR IV PUSH (FOR BLOOD PRESSURE SUPPORT)
PREFILLED_SYRINGE | INTRAVENOUS | Status: AC
  Filled 2018-10-26: qty 20

## 2018-10-26 MED ORDER — 0.9 % SODIUM CHLORIDE (POUR BTL) OPTIME
TOPICAL | Status: DC | PRN
  Administered 2018-10-26: 1000 mL

## 2018-10-26 MED ORDER — CEFAZOLIN SODIUM-DEXTROSE 2-4 GM/100ML-% IV SOLN
2.0000 g | INTRAVENOUS | Status: AC
  Administered 2018-10-26: 2 g via INTRAVENOUS
  Filled 2018-10-26: qty 100

## 2018-10-26 MED ORDER — FENTANYL CITRATE (PF) 100 MCG/2ML IJ SOLN
50.0000 ug | Freq: Once | INTRAMUSCULAR | Status: AC
  Administered 2018-10-26: 50 ug via INTRAVENOUS

## 2018-10-26 MED ORDER — ONDANSETRON HCL 4 MG/2ML IJ SOLN
INTRAMUSCULAR | Status: AC
  Filled 2018-10-26: qty 2

## 2018-10-26 MED ORDER — LIDOCAINE HCL (CARDIAC) PF 100 MG/5ML IV SOSY
PREFILLED_SYRINGE | INTRAVENOUS | Status: DC | PRN
  Administered 2018-10-26: 100 mg via INTRAVENOUS

## 2018-10-26 MED ORDER — ONDANSETRON HCL 4 MG PO TABS: 4.0000 mg | ORAL_TABLET | Freq: Four times a day (QID) | ORAL | Status: DC | PRN

## 2018-10-26 MED ORDER — LEVOTHYROXINE SODIUM 25 MCG PO TABS
125.0000 ug | ORAL_TABLET | Freq: Every day | ORAL | Status: DC
  Administered 2018-10-27 – 2018-10-31 (×5): 125 ug via ORAL
  Filled 2018-10-26 (×5): qty 1

## 2018-10-26 MED ORDER — HYDROXYZINE HCL 25 MG PO TABS
25.0000 mg | ORAL_TABLET | Freq: Every day | ORAL | Status: DC
  Administered 2018-10-26 – 2018-10-30 (×5): 25 mg via ORAL
  Filled 2018-10-26 (×5): qty 1

## 2018-10-26 MED ORDER — CEFAZOLIN SODIUM-DEXTROSE 2-4 GM/100ML-% IV SOLN
2.0000 g | Freq: Four times a day (QID) | INTRAVENOUS | Status: AC
  Administered 2018-10-26 – 2018-10-27 (×3): 2 g via INTRAVENOUS
  Filled 2018-10-26 (×3): qty 100

## 2018-10-26 MED ORDER — CALCIUM GLUCONATE 500 MG PO TABS
1.0000 | ORAL_TABLET | Freq: Two times a day (BID) | ORAL | Status: DC
  Filled 2018-10-26: qty 1

## 2018-10-26 MED ORDER — METOCLOPRAMIDE HCL 5 MG PO TABS: 5.0000 mg | ORAL_TABLET | Freq: Three times a day (TID) | ORAL | Status: DC | PRN

## 2018-10-26 SURGICAL SUPPLY — 81 items
AID PSTN UNV HD RSTRNT DISP (MISCELLANEOUS) ×1
ALCOHOL 70% 16 OZ (MISCELLANEOUS) ×3 IMPLANT
AUG COMP REV MI TAPER ADAPTER (Joint) ×3 IMPLANT
AUGMENT COMP REV MI TAPR ADPTR (Joint) IMPLANT
BEARING HUMERAL SHLDER 36M STD (Shoulder) IMPLANT
BIT DRILL 2.7 W/STOP DISP (BIT) ×1 IMPLANT
BIT DRILL 2.7MM W/STOP DISP (BIT) ×1
BIT DRILL 5/64X5 DISP (BIT) ×3 IMPLANT
BIT DRILL TWIST 2.7 (BIT) ×1 IMPLANT
BIT DRILL TWIST 2.7MM (BIT) ×1
BLADE SAG 18X100X1.27 (BLADE) ×3 IMPLANT
BRNG HUM STD 36 RVRS SHLDR (Shoulder) ×1 IMPLANT
BSPLAT GLND SM AUG TPR ADPR (Joint) ×1 IMPLANT
CLOSURE WOUND 1/2 X4 (GAUZE/BANDAGES/DRESSINGS) ×1
COVER SURGICAL LIGHT HANDLE (MISCELLANEOUS) ×3 IMPLANT
COVER WAND RF STERILE (DRAPES) ×3 IMPLANT
DRAPE IMP U-DRAPE 54X76 (DRAPES) ×6 IMPLANT
DRAPE INCISE IOBAN 66X45 STRL (DRAPES) ×3 IMPLANT
DRAPE ORTHO SPLIT 77X108 STRL (DRAPES) ×6
DRAPE SURG 17X23 STRL (DRAPES) ×3 IMPLANT
DRAPE SURG ORHT 6 SPLT 77X108 (DRAPES) ×2 IMPLANT
DRAPE U-SHAPE 47X51 STRL (DRAPES) ×3 IMPLANT
DRSG AQUACEL AG ADV 3.5X10 (GAUZE/BANDAGES/DRESSINGS) ×3 IMPLANT
DURAPREP 26ML APPLICATOR (WOUND CARE) ×3 IMPLANT
ELECT BLADE 4.0 EZ CLEAN MEGAD (MISCELLANEOUS) ×3
ELECT REM PT RETURN 9FT ADLT (ELECTROSURGICAL) ×3
ELECTRODE BLDE 4.0 EZ CLN MEGD (MISCELLANEOUS) ×1 IMPLANT
ELECTRODE REM PT RTRN 9FT ADLT (ELECTROSURGICAL) ×1 IMPLANT
GLENOID SPHERE 36MM CVD +3 (Orthopedic Implant) ×2 IMPLANT
GLOVE BIO SURGEON STRL SZ7.5 (GLOVE) ×3 IMPLANT
GLOVE BIOGEL PI IND STRL 8 (GLOVE) ×1 IMPLANT
GLOVE BIOGEL PI INDICATOR 8 (GLOVE) ×2
GOWN STRL REUS W/ TWL LRG LVL3 (GOWN DISPOSABLE) ×1 IMPLANT
GOWN STRL REUS W/ TWL XL LVL3 (GOWN DISPOSABLE) ×1 IMPLANT
GOWN STRL REUS W/TWL LRG LVL3 (GOWN DISPOSABLE) ×3
GOWN STRL REUS W/TWL XL LVL3 (GOWN DISPOSABLE) ×3
KIT BASIN OR (CUSTOM PROCEDURE TRAY) ×3 IMPLANT
KIT TURNOVER KIT B (KITS) ×3 IMPLANT
MANIFOLD NEPTUNE II (INSTRUMENTS) ×3 IMPLANT
NDL 1/2 CIR MAYO (NEEDLE) ×1 IMPLANT
NDL HYPO 25GX1X1/2 BEV (NEEDLE) ×1 IMPLANT
NEEDLE 1/2 CIR MAYO (NEEDLE) ×3 IMPLANT
NEEDLE HYPO 25GX1X1/2 BEV (NEEDLE) ×3 IMPLANT
NS IRRIG 1000ML POUR BTL (IV SOLUTION) ×3 IMPLANT
PACK SHOULDER (CUSTOM PROCEDURE TRAY) ×3 IMPLANT
PAD ARMBOARD 7.5X6 YLW CONV (MISCELLANEOUS) ×6 IMPLANT
PIN THREADED REVERSE (PIN) ×2 IMPLANT
REAMER GUIDE BUSHING SURG DISP (MISCELLANEOUS) ×2 IMPLANT
REAMER GUIDE W/SCREW AUG (MISCELLANEOUS) ×2 IMPLANT
RESTRAINT HEAD UNIVERSAL NS (MISCELLANEOUS) ×3 IMPLANT
SCREW BONE LOCKING 4.75X30X3.5 (Screw) ×2 IMPLANT
SCREW BONE STRL 6.5MMX30MM (Screw) ×2 IMPLANT
SCREW LOCKING 4.75MMX15MM (Screw) ×4 IMPLANT
SCREW LOCKING NS 4.75MMX20MM (Screw) ×2 IMPLANT
SHOULDER HUMERAL BEAR 36M STD (Shoulder) ×3 IMPLANT
SLING ARM IMMOBILIZER LRG (SOFTGOODS) ×2 IMPLANT
SLING ARM IMMOBILIZER MED (SOFTGOODS) IMPLANT
SPONGE LAP 18X18 X RAY DECT (DISPOSABLE) IMPLANT
SPONGE LAP 4X18 RFD (DISPOSABLE) ×3 IMPLANT
STEM HUMERAL STRL 14MMX122MM (Stem) ×2 IMPLANT
STRIP CLOSURE SKIN 1/2X4 (GAUZE/BANDAGES/DRESSINGS) ×2 IMPLANT
SUCTION FRAZIER HANDLE 10FR (MISCELLANEOUS) ×2
SUCTION TUBE FRAZIER 10FR DISP (MISCELLANEOUS) ×1 IMPLANT
SUT BROADBAND TAPE 2PK 1.5 (SUTURE) ×4 IMPLANT
SUT FIBERWIRE #2 38 T-5 BLUE (SUTURE) ×3
SUT MAXBRAID (SUTURE) ×2 IMPLANT
SUT MNCRL AB 4-0 PS2 18 (SUTURE) ×3 IMPLANT
SUT VIC AB 0 CT1 27 (SUTURE) ×3
SUT VIC AB 0 CT1 27XBRD ANBCTR (SUTURE) IMPLANT
SUT VIC AB 1 CT1 27 (SUTURE)
SUT VIC AB 1 CT1 27XBRD ANBCTR (SUTURE) IMPLANT
SUT VIC AB 2-0 CT1 27 (SUTURE) ×6
SUT VIC AB 2-0 CT1 TAPERPNT 27 (SUTURE) ×1 IMPLANT
SUTURE FIBERWR #2 38 T-5 BLUE (SUTURE) ×1 IMPLANT
SYR CONTROL 10ML LL (SYRINGE) ×3 IMPLANT
TOWEL OR 17X24 6PK STRL BLUE (TOWEL DISPOSABLE) ×3 IMPLANT
TOWEL OR 17X26 10 PK STRL BLUE (TOWEL DISPOSABLE) ×3 IMPLANT
TOWER CARTRIDGE SMART MIX (DISPOSABLE) IMPLANT
TRAY HUM MINI SHOULDER +0 40D (Shoulder) ×2 IMPLANT
WATER STERILE IRR 1000ML POUR (IV SOLUTION) ×3 IMPLANT
YANKAUER SUCT BULB TIP NO VENT (SUCTIONS) ×3 IMPLANT

## 2018-10-26 NOTE — Anesthesia Procedure Notes (Signed)
Anesthesia Regional Block: Interscalene brachial plexus block   Pre-Anesthetic Checklist: ,, timeout performed, Correct Patient, Correct Site, Correct Laterality, Correct Procedure, Correct Position, site marked, Risks and benefits discussed,  Surgical consent,  Pre-op evaluation,  At surgeon's request and post-op pain management  Laterality: Right  Prep: chloraprep       Needles:  Injection technique: Single-shot  Needle Type: Echogenic Stimulator Needle     Needle Length: 9cm  Needle Gauge: 21     Additional Needles:   Procedures:,,,, ultrasound used (permanent image in chart),,,,  Narrative:  Start time: 10/26/2018 3:09 PM End time: 10/26/2018 3:12 PM Injection made incrementally with aspirations every 5 mL.  Performed by: Personally  Anesthesiologist: Lidia Collum, MD  Additional Notes: Monitors applied. Injection made in 5cc increments. No resistance to injection. Good needle visualization. Patient tolerated procedure well.

## 2018-10-26 NOTE — Brief Op Note (Signed)
10/26/2018  6:16 PM  PATIENT:  Melinda Kerr  84 y.o. female  PRE-OPERATIVE DIAGNOSIS:  Right proximal humerus fracture  POST-OPERATIVE DIAGNOSIS:  Right proximal humerus fracture  PROCEDURE:  Procedure(s): REVERSE SHOULDER ARTHROPLASTY (Right)  SURGEON:  Surgeon(s) and Role:    * Nicholes Stairs, MD - Primary  PHYSICIAN ASSISTANT:   ASSISTANTS:  April Green, RNFA   ANESTHESIA:   regional and general  EBL:  100 mL   BLOOD ADMINISTERED:none  DRAINS: none   LOCAL MEDICATIONS USED:  NONE  SPECIMEN:  No Specimen  DISPOSITION OF SPECIMEN:  N/A  COUNTS:  YES  TOURNIQUET:  * No tourniquets in log *  DICTATION: .Note written in EPIC  PLAN OF CARE: Admit to inpatient   PATIENT DISPOSITION:  PACU - hemodynamically stable.   Delay start of Pharmacological VTE agent (>24hrs) due to surgical blood loss or risk of bleeding: not applicable

## 2018-10-26 NOTE — Discharge Instructions (Signed)
Post Op Shoulder replacement instructions:  - no weight bearing to the right arm - maintain sling for comfort, but may remove to do daily exercise as instructed by occupational therapy - sleep in your sling until your follow up appointment with Dr. Stann Mainland. - follow up with Dr. Stann Mainland in 2 weeks - maintain your post op bandage until your follow up appointment  - ok to shower with this bandage in place but do not submerge under water. - apply ice to the left shoulder 20-30 minutes at a time for each hour you are awake

## 2018-10-26 NOTE — Op Note (Signed)
Date:  1/29/ 2020  PRE-OPERATIVE DIAGNOSIS: Right proximal humerus fracture  POST-OPERATIVE DIAGNOSIS: Same  PROCEDURE: 1. REVERSE SHOULDER ARTHROPLASTY   SURGEON: Nicholes Stairs, MD  ASSISTANT: April Green, RNFA  ANESTHESIA: General with a block  ESTIMATED BLOOD LOSS: 100 cc  PREOPERATIVE INDICATIONS: Melinda Kerr is a right-hand-dominant 84 year old female who sustained a right proximal humerus fracture following a fall.Due to the comminution and displaced nature of the fracture and head splitting components we discussed operative management.  We discussed moving forward with reverse shoulder arthroplasty for his injury to allow earlier weight bearing on a walker and/or cane as needed and lower risk of AVN, non union or progression of shoulder arthritis following the fracture. Thus we elected to proceed with reverse shoulder arthroplasty for the plan. The risks benefits and alternatives were discussed with the patient preoperatively including but not limited to the risks of infection, bleeding, nerve injury, cardiopulmonary complications, the need for revision surgery, dislocation, brachial plexus palsy, incomplete relief of pain, among others, and the patient was willing to proceed. The patient did provided informed consent.  OPERATIVE IMPLANTS: Biomet size 14 mm humeral fracturestem press fitted  with a 40 mm standard mini humeral tray and a standard liner and a 36 mm +3 glenosphere with a 25 mm(mini) baseplate with a small superiorly oriented augment and 4, 4.75locking screws and one central 6.5 mm nonlocking screw.  OPERATIVE FINDINGS:   This was a highly comminuted right proximal humerus fracture with a large mid coronal humeral head split.  The greater tuberosity had a large portion of humeral head attached with the remainder following anteriorly.  There was anterior subluxation but no dislocation of the humeral head.  The tuberosities were greatly  comminuted as well.  The bone quality was poor.  The long head of biceps tendon was incarcerated between the lesser tuberosity and main head fragment.  This was 90% torn.  This was tenotomized.  OPERATIVE PROCEDURE: The patient was brought to the operating room and placed in the supine position. General anesthesia was administered. IV antibiotics were given. Time out was performed. The upper extremity was prepped and draped in usual sterile fashion. The patient was in a beachchair position. Deltopectoral approach was carried out. After dissection through skin and subcutaneous fat, the cephalic vein was identified with the deltopectoral interval. This was mobilized and taken medial.  The fracture was identified and working through the fracture in the biceps groove the lesser and greater tuberosities were freed up and tagged with heavy tape permanent sutures. The humeral head was fragmented and removed from the wound.   Next, the long head of the biceps tendon was tenotomized due to her advanced age and the poor quality of the remaining tendon.  I then performed circumferential releases of the humerus. I then moved to sizing the humerus. There was moderate calcar bone loss and this was used to reference the height of the stem, as was the upper border of the pectoralis major tendon, with our goal of restoring at least anatomic length of 5.6 cm from the top of the pectoralis major tendon to the top of the glenosphere. The canal was reamed and found to fit best with a 43mm fracture stem.  We next turned to the glenoid. Deep retractors were placed, and I resected the labrum as well as the residual long head of biceps, and then placed a guidepin into the center position on the glenoid, with slight inferior declination. I then reamed over the guidepin, and  this created a small metaphyseal cancellus blush inferiorly, removing just the cartilage to the subchondral bone superiorly.  In an effort to  preserve as much bone as possible we elected to use a small sized superior augment to help with lateralization and inferior position of the glenosphere.  This was achieved via the step reamer to get cancellus blush superiorly without removing bone.  The base plate was selected and impacted place, and then I secured it centrally with a nonlocking screw, and I had excellent purchase both inferiorly and superiorly. I placed a short locking screws on anterior aspect,  And posterior aspect.  The small augment was oriented superiorly and had good fit and cortical contact.  I then turned my attention to the glenosphere, and impacted this into place, placing slight inferior offset (position B).   The glenoid sphere was completely seated, and had engagement of the [facility] taper. I then turned my attention back to the humerus.   The 53mm fracture stem was seated to the appropriate height to allow approximate 5.6 cm from the top of the Pectoralis major tendon to the top of the glenosphere. The stem was placed in 30 degrees of retroversion.  Once the stem was impacted into place we trialed poly liners. Using the standard humeral metaglen, The shoulder had excellent motion, and was stable. The final poly was impacted and again showed good motion and stability. Next,I irrigated the wounds copiously.   The greater tuberosity was brought back to the humeral stem suture holes and secured with bone graft from the humeral head as augment. The lesser tuberosity was unfortunately not able to be brought back to the stem due to lateralization from the +3 glenosphere, but the deltoid was noted to have excellent tension. We did place some sutures in a figure-of-eight fashion from the remaining subscapularis stump into the supraspinatus tendon proximally.  Again this did not come all the way back to the lesser tuberosity due to fracture and bone loss as well as lateralization.  The axillary nerve was palpated at the end  of implanting, and found to be in continuity and not under undo tension.  I then irrigated the shoulder copiously once more, and placed 1 g of vancomycin powder into the wound, and repaired the deltopectoral interval with Vicryl followed by subcutaneous monocryl and then subcuticular monocrylwith Steri-Strips and sterile gauze for the skin. The patient was awakened and returned back in stable and satisfactory condition. There no complications and hetolerated the procedure well. All counts were correct. The patient awakened from general anesthesia with no complications and transferred to PACU in stable condition.  Postoperative Plan: Melinda Kerr will remain in his sling until her regional block has worn off, but is ok to remove it for use with his walker. The sling will be worn for sleep for 6 weeks. SHe will begin shoulder PT in 1 week from discharge. Melinda Kerr is likely going to need skilled nursing at discharge due to living independently.  Also given her history of DVT we will keep her on subcutaneous Lovenox while in the hospital and discharge her home on 10 mg of Xarelto once daily for 1 month.

## 2018-10-26 NOTE — Anesthesia Postprocedure Evaluation (Signed)
Anesthesia Post Note  Patient: Melinda Kerr  Procedure(s) Performed: REVERSE SHOULDER ARTHROPLASTY (Right Shoulder)     Patient location during evaluation: PACU Anesthesia Type: General and Regional Level of consciousness: awake and alert, patient cooperative and oriented Pain management: pain level controlled Vital Signs Assessment: post-procedure vital signs reviewed and stable Respiratory status: spontaneous breathing, nonlabored ventilation, respiratory function stable and patient connected to nasal cannula oxygen Cardiovascular status: blood pressure returned to baseline and stable Postop Assessment: no apparent nausea or vomiting Anesthetic complications: no Comments: Pt with decreased breath sounds on R: elevated R hemidiaphragm (paresis from interscalene block), no PTX. Will keep on [place] O2 overnight.    Last Vitals:  Vitals:   10/26/18 1900 10/26/18 1915  BP: (!) 130/55 120/83  Pulse: 73 78  Resp: (!) 30 (!) 22  Temp:    SpO2: 94% 94%    Last Pain:  Vitals:   10/26/18 1915  TempSrc:   PainSc: 0-No pain                 Custer Pimenta,E. Lucilla Petrenko

## 2018-10-26 NOTE — Transfer of Care (Signed)
Immediate Anesthesia Transfer of Care Note  Patient: Melinda Kerr  Procedure(s) Performed: REVERSE SHOULDER ARTHROPLASTY (Right Shoulder)  Patient Location: PACU  Anesthesia Type:General  Level of Consciousness: drowsy and patient cooperative  Airway & Oxygen Therapy: Patient Spontanous Breathing and Patient connected to face mask oxygen  Post-op Assessment: Report given to RN and Post -op Vital signs reviewed and stable  Post vital signs: Reviewed and stable  Last Vitals:  Vitals Value Taken Time  BP    Temp    Pulse    Resp    SpO2      Last Pain:  Vitals:   10/26/18 1334  TempSrc:   PainSc: 0-No pain      Patients Stated Pain Goal: 3 (12/30/57 1368)  Complications: No apparent anesthesia complications

## 2018-10-26 NOTE — Anesthesia Procedure Notes (Signed)
Procedure Name: Intubation Date/Time: 10/26/2018 4:20 PM Performed by: Jonna Munro, CRNA Pre-anesthesia Checklist: Patient identified, Emergency Drugs available, Suction available, Patient being monitored and Timeout performed Patient Re-evaluated:Patient Re-evaluated prior to induction Oxygen Delivery Method: Circle system utilized Preoxygenation: Pre-oxygenation with 100% oxygen Induction Type: IV induction Ventilation: Mask ventilation without difficulty Laryngoscope Size: Mac and 3 Tube type: Oral Tube size: 7.5 mm Number of attempts: 1 Airway Equipment and Method: Stylet Placement Confirmation: positive ETCO2,  breath sounds checked- equal and bilateral and ETT inserted through vocal cords under direct vision Secured at: 21 cm Tube secured with: Tape Dental Injury: Teeth and Oropharynx as per pre-operative assessment

## 2018-10-26 NOTE — Anesthesia Preprocedure Evaluation (Addendum)
Anesthesia Evaluation    Reviewed: Allergy & Precautions, Patient's Chart, lab work & pertinent test results  History of Anesthesia Complications Negative for: history of anesthetic complications  Airway Mallampati: II  TM Distance: >3 FB Neck ROM: Full    Dental no notable dental hx. (+) Dental Advisory Given   Pulmonary sleep apnea ,    breath sounds clear to auscultation (-) decreased breath sounds      Cardiovascular hypertension, + DVT   Rhythm:Regular Rate:Normal     Neuro/Psych negative neurological ROS  negative psych ROS   GI/Hepatic Neg liver ROS, GERD  ,  Endo/Other  diabetesHypothyroidism Morbid obesity  Renal/GU negative Renal ROS  negative genitourinary   Musculoskeletal negative musculoskeletal ROS (+)   Abdominal (+) + obese,   Peds  Hematology negative hematology ROS (+)   Anesthesia Other Findings 84 yo F with prox humerus fx for rTSA - PMH: OSA on CPAP, HTN, DVT, hypothyroid, DM2, BMI 48, GERD - Pt states she has a history of CHF after her hysterectomy, however there is no documentation that I can find of this, and no echo in our system. She says she does not receive care anywhere else, so I believe this was likely a misunderstanding.  Reproductive/Obstetrics                           Anesthesia Physical Anesthesia Plan  ASA: III  Anesthesia Plan: General   Post-op Pain Management: GA combined w/ Regional for post-op pain   Induction: Intravenous  PONV Risk Score and Plan: 3 and Ondansetron, Dexamethasone and Treatment may vary due to age or medical condition  Airway Management Planned: Oral ETT  Additional Equipment: None  Intra-op Plan:   Post-operative Plan: Extubation in OR  Informed Consent: I have reviewed the patients History and Physical, chart, labs and discussed the procedure including the risks, benefits and alternatives for the proposed anesthesia  with the patient or authorized representative who has indicated his/her understanding and acceptance.     Dental advisory given  Plan Discussed with:   Anesthesia Plan Comments:        Anesthesia Quick Evaluation

## 2018-10-26 NOTE — Progress Notes (Signed)
Patient wheezing with tachypnea, albuterol nebulizer given per Dr. Glennon Mac with anesthesia. Patient still labored breathing but appears more comfortable. Dr. Glennon Mac okay with patient going to room with oxygen overnight. Will continue to monitor.

## 2018-10-26 NOTE — H&P (Signed)
ORTHOPAEDIC H and P  REQUESTING PHYSICIAN: Nicholes Stairs, MD  PCP:  Maury Dus, MD  Chief Complaint: Comminuted right proximal humerus fracture  HPI: Melinda Kerr is a 84 y.o. female who complains of right shoulder pain following a ground-level fall at her friend's home last week.  She presented to the [facility] emergency department where she was noted to have a comminuted proximal humerus fracture with head splitting component.  She is indicated for reverse shoulder arthroplasty for his dominant extremity injury.  We did meet in the office last week and discussed moving forward with this plan today.  She has no new complaints today and no new questions.  Past Medical History:  Diagnosis Date  . Arthritis    "shoulders, knees, lower back" (07/14/2017)  . CHF (congestive heart failure) ([place])    "while in hospital w/hysterectomy in 2017"  . Chronic lower back pain   . Depression   . Diverticulitis of large intestine with abscess 07/13/2017  . DVT (deep venous thrombosis) ([place])   . Dyspnea    "cause I'm over weight"  . Esophageal reflux   . Gout   . Hyperlipidemia    under control  . Hypertension   . Hypothyroidism   . Morbid obesity with BMI of 40.0-44.9, adult ([place])   . Nocturia   . OSA on CPAP    "not wearing it when I sleep in my lift chair" (07/14/2017)  . Osteoarthritis   . Phlebitis    hx of  . Pneumonia ?1989  . Spondylosis    with scoliosis  . Type II diabetes mellitus (HCC)    borderline , diet controlled   . Urinary frequency   . Uterine cancer (HCC)    S/P chemo, radiation, hysterectomy   Past Surgical History:  Procedure Laterality Date  . APPENDECTOMY    . CATARACT EXTRACTION, BILATERAL Bilateral 2018  . COLONOSCOPY    . DILATATION & CURETTAGE/HYSTEROSCOPY WITH MYOSURE N/A 03/04/2016   Procedure: DILATATION & CURETTAGE/HYSTEROSCOPY ;  Surgeon: Azucena Fallen, MD;  Location: [facility];  Service: Gynecology;  Laterality: N/A;  polyp removal    . HERNIA REPAIR    . IR GENERIC HISTORICAL  05/15/2016   IR US GUIDE VASC ACCESS RIGHT 05/15/2016 Aletta Edouard, MD [facility]  . IR GENERIC HISTORICAL  05/15/2016   IR FLUORO GUIDE CV LINE RIGHT 05/15/2016 Aletta Edouard, MD [facility]  . IR REMOVAL TUN ACCESS W/ PORT W/O FL MOD SED  04/19/2018  . LAPAROSCOPIC CHOLECYSTECTOMY  2004  . LAPAROSCOPIC INCISIONAL / UMBILICAL / VENTRAL HERNIA REPAIR  2004   "w/gallbladder OR"  . LYMPH NODE BIOPSY N/A 03/26/2016   Procedure: Sentinel LYMPH NODE BIOPSY;  Surgeon: Everitt Amber, MD;  Location: [facility];  Service: Gynecology;  Laterality: N/A;  . ROBOTIC ASSISTED TOTAL HYSTERECTOMY WITH BILATERAL SALPINGO OOPHERECTOMY N/A 03/26/2016   Procedure: XI ROBOTIC ASSISTED TOTAL Laproscopic HYSTERECTOMY WITH BILATERAL SALPINGO OOPHORECTOMY;  Surgeon: Everitt Amber, MD;  Location: [facility];  Service: Gynecology;  Laterality: N/A;  . TOTAL KNEE ARTHROPLASTY Left 10/09/2013   Procedure: LEFT TOTAL KNEE ARTHROPLASTY;  Surgeon: [name], MD;  Location: [facility];  Service: Orthopedics;  Laterality: Left;  . TOTAL KNEE ARTHROPLASTY Right 04/09/2014   Procedure: RIGHT TOTAL KNEE ARTHROPLASTY;  Surgeon: [name], MD;  Location: [facility];  Service: Orthopedics;  Laterality: Right;  . TUBAL LIGATION  1982  . VARICOSE VEIN SURGERY Bilateral 1968  . [place]  Social History   Socioeconomic History  . Marital status: Widowed    Spouse name: Not on file  . Number of children: 0  . Years of education: Not on file  . Highest education level: Not on file  Occupational History  . 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 Financial resource strain: Not on file  . Food insecurity:    Worry: Not on file    Inability: Not on file  . Transportation needs:    Medical: Not on file    Non-medical: Not on file  Tobacco Use  . Smoking status: Never Smoker  . Smokeless tobacco: Never Used  Substance and Sexual Activity  . Alcohol use: No    . Drug use: No  . Sexual activity: Never  Lifestyle  . Physical activity:    Days per week: Not on file    Minutes per session: Not on file  . Stress: Not on file  Relationships  . Social connections:    Talks on phone: Not on file    Gets together: Not on file    Attends religious service: Not on file    Active member of club or organization: Not on file    Attends meetings of clubs or organizations: Not on file    Relationship status: Not on file  Other Topics Concern  . Not on file  Social History Narrative  . Not on file   Family History  Problem Relation Age of Onset  . Heart disease Mother   . Heart disease Father   . CVA Father    Allergies  Allergen Reactions  . Oxybutynin     Bleeding gums, felt sick   Prior to Admission medications   Medication Sig Start Date End Date Taking? Authorizing Provider  acetaminophen (TYLENOL) 500 MG tablet Take 1,000 mg by mouth 2 (two) times daily.     [provider]  alendronate (FOSAMAX) 70 MG tablet Take 70 mg by mouth once a week. Take with a full glass of water on an empty stomach.    [provider]  allopurinol (ZYLOPRIM) 300 MG tablet Take 300 mg by mouth daily with breakfast.     [provider]  calcium gluconate 500 MG tablet Takes 1 tablet in am and 2 in the evening.    [provider]  cetirizine (ZYRTEC) 10 MG tablet Take 10 mg by mouth daily.    [provider]  citalopram (CELEXA) 20 MG tablet Take 20 mg by mouth daily.  01/10/15   [provider]  Cyanocobalamin (VITAMIN B-12 PO) Take 1 tablet by mouth daily.     [provider]  furosemide (LASIX) 20 MG tablet Take 20 mg by mouth daily. 01/10/15   [provider]  HAWTHORNE BERRY PO Take 500 mg by mouth 2 (two) times daily.    [provider]  hydrOXYzine (ATARAX/VISTARIL) 25 MG tablet Take 25 mg by mouth at bedtime.    [provider]  levothyroxine (SYNTHROID, LEVOTHROID) 125  MCG tablet Take 125 mcg by mouth daily before breakfast.    [provider]  lisinopril (PRINIVIL,ZESTRIL) 20 MG tablet Take 20 mg by mouth daily with breakfast.     [provider]  Melatonin 2.5 MG CAPS Take 2.5 mg by mouth at bedtime.     [provider]  Multiple Vitamins-Minerals (MULTIVITAMIN WITH MINERALS) tablet Take 1 tablet by mouth daily.    [provider]  Omega-3 Fatty Acids ([place]  OIL PO) Take 1 tablet by mouth daily.    [provider]  omeprazole (PRILOSEC OTC) 20 MG tablet Take 20 mg by mouth daily.    [provider]  oxyCODONE-acetaminophen (PERCOCET) 5-325 MG tablet Take 1 tablet by mouth every 4 (four) hours as needed. 10/19/18   Larene Pickett, PA-C  polyethylene glycol [facility] / Floria Raveling) packet Take 17 g by mouth daily as needed for mild constipation or moderate constipation. 04/12/14   Perkins, Alexzandrew L, PA-C  pravastatin (PRAVACHOL) 10 MG tablet Take 10 mg by mouth every 7 (seven) days.  02/14/18   [provider]  pyridOXINE (VITAMIN B-6) 100 MG tablet Take 100 mg by mouth daily.    [provider]  tolterodine (DETROL LA) 4 MG 24 hr capsule Take 4 mg by mouth daily.    [provider]  traMADol (ULTRAM) 50 MG tablet Take 1-2 tablets (50-100 mg total) by mouth every 6 (six) hours as needed (mild pain). 04/12/14   Perkins, Alexzandrew L, PA-C  vitamin C (ASCORBIC ACID) 500 MG tablet Take 500 mg by mouth daily.    [provider]   No results found.  Positive ROS: All other systems have been reviewed and were otherwise negative with the exception of those mentioned in the HPI and as above.  Physical Exam: General: Alert, no acute distress Cardiovascular: No pedal edema Respiratory: No cyanosis, no use of accessory musculature GI: No organomegaly, abdomen is soft and non-tender Skin: No lesions in the area of chief complaint Neurologic: Sensation intact distally Psychiatric:  Patient is competent for consent with normal mood and affect Lymphatic: No axillary or cervical lymphadenopathy    Assessment: -Comminuted right proximal humerus fracture with head splitting component  Plan: -Ms. Shepherd and her niece and I discussed the necessity of operative intervention given the head splitting component and her dominant extremity.  This is best managed with a reverse shoulder arthroplasty.  We will proceed with that today.  We again reviewed all the risk and benefits of this procedure and solicited all questions which were answered to her satisfaction. - The risks, benefits, and alternatives were discussed with the patient. There are risks associated with the surgery including, but not limited to, problems with anesthesia (death), infection, differences in leg length/angulation/rotation, fracture of bones, loosening or failure of implants, malunion, nonunion, hematoma (blood accumulation) which may require surgical drainage, blood clots, pulmonary embolism, nerve injury (foot drop), and blood vessel injury. The patient understands these risks and elects to proceed.  -Her niece does have some concerns about her ultimate disposition following this hospitalization and may ultimately benefit from skilled nursing facility.  We will have her evaluated by the therapist on the floor pos- operatively.    Nicholes Stairs, MD Cell [phone]    10/26/2018 12:01 PM

## 2018-10-27 ENCOUNTER — Encounter (HOSPITAL_COMMUNITY): Payer: Self-pay | Admitting: Orthopedic Surgery

## 2018-10-27 ENCOUNTER — Other Ambulatory Visit: Payer: Self-pay

## 2018-10-27 ENCOUNTER — Inpatient Hospital Stay (HOSPITAL_COMMUNITY): Payer: Medicare Other

## 2018-10-27 DIAGNOSIS — Z96611 Presence of right artificial shoulder joint: Secondary | ICD-10-CM

## 2018-10-27 LAB — BASIC METABOLIC PANEL
Anion gap: 6 (ref 5–15)
BUN: 19 mg/dL (ref 8–23)
CO2: 26 mmol/L (ref 22–32)
Calcium: 8.2 mg/dL — ABNORMAL LOW (ref 8.9–10.3)
Chloride: 104 mmol/L (ref 98–111)
Creatinine, Ser: 1.02 mg/dL — ABNORMAL HIGH (ref 0.44–1.00)
GFR calc non Af Amer: 52 mL/min — ABNORMAL LOW (ref >60)
GFR, EST AFRICAN AMERICAN: 60 mL/min — AB (ref >60)
Glucose, Bld: 223 mg/dL — ABNORMAL HIGH (ref 70–99)
POTASSIUM: 5.2 mmol/L — AB (ref 3.5–5.1)
Sodium: 136 mmol/L (ref 135–145)

## 2018-10-27 LAB — CBC
HCT: 29.9 % — ABNORMAL LOW (ref 36.0–46.0)
HCT: 30.5 % — ABNORMAL LOW (ref 36.0–46.0)
Hemoglobin: 9.4 g/dL — ABNORMAL LOW (ref 12.0–15.0)
Hemoglobin: 9.4 g/dL — ABNORMAL LOW (ref 12.0–15.0)
MCH: 31.2 pg (ref 26.0–34.0)
MCH: 30.8 pg (ref 26.0–34.0)
MCHC: 31.4 g/dL (ref 30.0–36.0)
MCHC: 30.8 g/dL (ref 30.0–36.0)
MCV: 99.3 fL (ref 80.0–100.0)
MCV: 100 fL (ref 80.0–100.0)
NRBC: 0 % (ref 0.0–0.2)
Platelets: 288 10*3/uL (ref 150–400)
Platelets: 313 10*3/uL (ref 150–400)
RBC: 3.01 MIL/uL — AB (ref 3.87–5.11)
RBC: 3.05 MIL/uL — ABNORMAL LOW (ref 3.87–5.11)
RDW: 14.1 % (ref 11.5–15.5)
RDW: 14.2 % (ref 11.5–15.5)
WBC: 10 10*3/uL (ref 4.0–10.5)
WBC: 12.1 10*3/uL — ABNORMAL HIGH (ref 4.0–10.5)
nRBC: 0 % (ref 0.0–0.2)

## 2018-10-27 LAB — COMPREHENSIVE METABOLIC PANEL
ALBUMIN: 2.5 g/dL — AB (ref 3.5–5.0)
ALT: 56 U/L — ABNORMAL HIGH (ref 0–44)
AST: 63 U/L — ABNORMAL HIGH (ref 15–41)
Alkaline Phosphatase: 171 U/L — ABNORMAL HIGH (ref 38–126)
Anion gap: 9 (ref 5–15)
BUN: 22 mg/dL (ref 8–23)
CHLORIDE: 102 mmol/L (ref 98–111)
CO2: 25 mmol/L (ref 22–32)
Calcium: 8.7 mg/dL — ABNORMAL LOW (ref 8.9–10.3)
Creatinine, Ser: 1.14 mg/dL — ABNORMAL HIGH (ref 0.44–1.00)
GFR calc Af Amer: 52 mL/min — ABNORMAL LOW (ref >60)
GFR calc non Af Amer: 45 mL/min — ABNORMAL LOW (ref >60)
GLUCOSE: 163 mg/dL — AB (ref 70–99)
Potassium: 4.8 mmol/L (ref 3.5–5.1)
Sodium: 136 mmol/L (ref 135–145)
Total Bilirubin: 0.7 mg/dL (ref 0.3–1.2)
Total Protein: 5.2 g/dL — ABNORMAL LOW (ref 6.5–8.1)

## 2018-10-27 LAB — MAGNESIUM: Magnesium: 2 mg/dL (ref 1.7–2.4)

## 2018-10-27 LAB — BRAIN NATRIURETIC PEPTIDE: B Natriuretic Peptide: 331.3 pg/mL — ABNORMAL HIGH (ref 0.0–100.0)

## 2018-10-27 MED ORDER — IOPAMIDOL (ISOVUE-370) INJECTION 76%
100.0000 mL | Freq: Once | INTRAVENOUS | Status: AC | PRN
  Administered 2018-10-27: 100 mL via INTRAVENOUS

## 2018-10-27 NOTE — Progress Notes (Signed)
Called dr Stann Mainland office and spoke with Merla Riches. Orders have been put in for a STAT portable chest x ray and PRN nebulizer. Patient is complaining of shortness of breath, spitting up mucus and has some expiratory wheezing.

## 2018-10-27 NOTE — Progress Notes (Signed)
Patient has arrived to unit A&Ox4 & no pain. Patient 02 is fluctuating between 78-98%. Patient placed on 4L for a short period of time with levels ranging. No complaints of shortness of breath. Will continue to monitor.

## 2018-10-27 NOTE — Progress Notes (Signed)
Pt continues to have Clarita. MD aware and abnormal labs also reported. IV team in the room to place new IV site for CT scan. Pt completed neb tx at this time. Continue to monitor, report to MD as needed.

## 2018-10-27 NOTE — Consult Note (Signed)
Medical Consultation   KIMIKO COMMON  CVE:[id number]  DOB: 04/01/1935  DOA: 10/26/2018  PCP: Maury Dus, MD   Outpatient Specialists: Corine Shelter - orthopedics   Requesting physician: Stann Mainland - orthopedics  Reason for consultation: Needs 2-4L O2 here in the office.  Possibly phrenic nerve injury following the nerve block.  Also with baseline OSA and morbid obesity.  Request evaluation of hypoxia.   History of Present Illness: Melinda Kerr is an 84 y.o. female with h/o uterine CA; DM; OSA on CPAP; morbid obesity (BMI 49.5); HTN; HLD; hypothyroidism; and CHD presenting with fall and proximal humerus fx.  She had shoulder replacement yesterday.  She reports feeling well except for problems with her breathing and wheezing.  She has not had this before.  It started while she has been in the hospital.  She has not received a breathing treatment.  She did get "a medicine" last night and that helped.  Today, she was having a bath and it got worse.  Cough some last night that was productive and she was given a pill and the cough resolved.  She fell Tuesday night a week ago - they brought her to the ER but she was splinted and discharged with plan for return for surgery.   Review of Systems:  ROS As per HPI otherwise 10 point review of systems negative.    Past Medical History: Past Medical History:  Diagnosis Date  . Arthritis    "shoulders, knees, lower back" (07/14/2017)  . CHF (congestive heart failure) ([place])    "while in hospital w/hysterectomy in 2017"  . Chronic lower back pain   . Depression   . Diverticulitis of large intestine with abscess 07/13/2017  . DVT (deep venous thrombosis) ([place])   . Dyspnea    "cause I'm over weight"  . Esophageal reflux   . Gout   . Hyperlipidemia    under control  . Hypertension   . Hypothyroidism   . Morbid obesity with BMI of 40.0-44.9, adult ([place])   . Nocturia   . OSA on CPAP    "not wearing it when I sleep in my lift  chair" (07/14/2017)  . Osteoarthritis   . Phlebitis    hx of  . Pneumonia ?1989  . Spondylosis    with scoliosis  . Type II diabetes mellitus (HCC)    borderline , diet controlled   . Urinary frequency   . Uterine cancer (HCC)    S/P chemo, radiation, hysterectomy    Past Surgical History: Past Surgical History:  Procedure Laterality Date  . APPENDECTOMY    . CATARACT EXTRACTION, BILATERAL Bilateral 2018  . COLONOSCOPY    . DILATATION & CURETTAGE/HYSTEROSCOPY WITH MYOSURE N/A 03/04/2016   Procedure: DILATATION & CURETTAGE/HYSTEROSCOPY ;  Surgeon: Azucena Fallen, MD;  Location: [facility];  Service: Gynecology;  Laterality: N/A;  polyp removal   . HERNIA REPAIR    . IR GENERIC HISTORICAL  05/15/2016   IR US GUIDE VASC ACCESS RIGHT 05/15/2016 Aletta Edouard, MD [facility]  . IR GENERIC HISTORICAL  05/15/2016   IR FLUORO GUIDE CV LINE RIGHT 05/15/2016 Aletta Edouard, MD [facility]  . IR REMOVAL TUN ACCESS W/ PORT W/O FL MOD SED  04/19/2018  . LAPAROSCOPIC CHOLECYSTECTOMY  2004  . LAPAROSCOPIC INCISIONAL / UMBILICAL / VENTRAL HERNIA REPAIR  2004   "w/gallbladder OR"  . LYMPH NODE BIOPSY N/A 03/26/2016   Procedure: Sentinel  LYMPH NODE BIOPSY;  Surgeon: Everitt Amber, MD;  Location: [facility];  Service: Gynecology;  Laterality: N/A;  . ROBOTIC ASSISTED TOTAL HYSTERECTOMY WITH BILATERAL SALPINGO OOPHERECTOMY N/A 03/26/2016   Procedure: XI ROBOTIC ASSISTED TOTAL Laproscopic HYSTERECTOMY WITH BILATERAL SALPINGO OOPHORECTOMY;  Surgeon: Everitt Amber, MD;  Location: [facility];  Service: Gynecology;  Laterality: N/A;  . TOTAL KNEE ARTHROPLASTY Left 10/09/2013   Procedure: LEFT TOTAL KNEE ARTHROPLASTY;  Surgeon: [name], MD;  Location: [facility];  Service: Orthopedics;  Laterality: Left;  . TOTAL KNEE ARTHROPLASTY Right 04/09/2014   Procedure: RIGHT TOTAL KNEE ARTHROPLASTY;  Surgeon: [name], MD;  Location: [facility];  Service: Orthopedics;  Laterality: Right;  . TUBAL LIGATION  1982  . VARICOSE VEIN  SURGERY Bilateral 1968  . WISDOM TOOTH EXTRACTION  1963     Allergies:   Allergies  Allergen Reactions  . Oxybutynin     Bleeding gums, felt sick     Social History:  reports that she has never smoked. She has never used smokeless tobacco. She reports that she does not drink alcohol or use drugs.   Family History: Family History  Problem Relation Age of Onset  . Heart disease Mother   . Heart disease Father   . CVA Father       Physical Exam: Vitals:   10/26/18 2211 10/27/18 0516 10/27/18 1422 10/27/18 1450  BP: (!) 130/55 (!) 95/57 (!) 121/51   Pulse: 66 (!) 59 69   Resp:  18 16   Temp: 97.6 F (36.4 C) 97.8 F (36.6 C) 97.6 F (36.4 C)   TempSrc: Oral Oral Oral   SpO2: 98% 96% 99%   Weight:    122.8 kg  Height:        Constitutional: Alert and awake, oriented x3, not in any acute distress. She is quite obese. Eyes: EOMI, irises appear normal, anicteric sclera,  ENMT: external ears and nose appear normal, normal hearing, Lips appear normal, oropharynx mucosa appears normal  Neck: neck appears normal, no masses, normal ROM, no thyromegaly, no JVD  CVS: S1-S2 clear, no murmur rubs or gallops, no LE edema, normal pedal pulses  Respiratory:  clear to auscultation bilaterally, no wheezing, rales or rhonchi. Respiratory effort increased mildly while on [place] O2 No accessory muscle use.  Abdomen: soft nontender, nondistended, normal bowel sounds, no hepatosplenomegaly, no hernias  Musculoskeletal: : no cyanosis, clubbing or edema noted bilaterally.  Right shoulder brace in place. Psych: judgement and insight appear normal, stable mood and affect, mental status Skin: no rashes or lesions or ulcers, no induration or nodules    Data reviewed:  I have personally reviewed the recent labs and imaging studies  Pertinent Labs:   K+ 5.2 Glucose 223 BUN 19/Creatinine 1.02/GFR 52 Hgb 9.4 A1c 6.2 on 1/29   Inpatient Medications:   Scheduled Meds: . acetaminophen  500  mg Oral Q6H  . allopurinol  300 mg Oral Q breakfast  . calcium carbonate  1 tablet Oral BID WC  . citalopram  20 mg Oral Daily  . docusate sodium  100 mg Oral BID  . enoxaparin (LOVENOX) injection  30 mg Subcutaneous Q24H  . fesoterodine  4 mg Oral Daily  . furosemide  20 mg Oral Daily  . hydrOXYzine  25 mg Oral QHS  . levothyroxine  125 mcg Oral Q0600  . lisinopril  20 mg Oral Q breakfast  . loratadine  10 mg Oral Daily  . Melatonin  3 mg Oral QHS  .  naproxen  250 mg Oral BID WC  . pantoprazole  40 mg Oral Daily  . pyridOXINE  100 mg Oral Daily  . traMADol  50 mg Oral Q6H  . vitamin B-12  100 mcg Oral Daily  . vitamin C  500 mg Oral Daily   Continuous Infusions:   Radiological Exams on Admission: Dg Chest Port 1 View  Result Date: 10/26/2018 CLINICAL DATA:  Dyspnea. EXAM: PORTABLE CHEST 1 VIEW COMPARISON:  Radiograph 08/24/2016 FINDINGS: Lower lung volumes from prior exam. Cardiomegaly is similar. Aortic atherosclerosis. Vascular congestion without evidence pulmonary edema. Streaky right perihilar and left lung base atelectasis. No large pleural effusion or pneumothorax. Right shoulder arthroplasty partially included. IMPRESSION: 1. Low lung volumes with cardiomegaly and vascular congestion. 2. Streaky right perihilar and left lung base atelectasis. Electronically Signed   By: Keith Rake M.D.   On: 10/26/2018 23:48   Dg Shoulder Right Port  Result Date: 10/26/2018 CLINICAL DATA:  Status post reversed total shoulder arthroplasty. EXAM: PORTABLE RIGHT SHOULDER COMPARISON:  Radiographs of October 18, 2018. FINDINGS: The glenoid and humeral components of the prosthesis are well situated. No dislocation is noted. IMPRESSION: Status post right shoulder arthroplasty. Electronically Signed   By: Marijo Conception, M.D.   On: 10/26/2018 19:30    Impression/Recommendations Principal Problem:   S/P reverse total shoulder arthroplasty, right Active Problems:   OSA on CPAP   Morbid  obesity with BMI of 45.0-49.9, adult (HCC)   Essential hypertension   Dyspnea on exertion  R shoulder arthroplasty -Fracture last week, shoulder replacement yesterday -Management per orthopedics, Dr. Corine Shelter  Dyspnea -Patient with severe obesity, experiencing persistent dyspnea with hypoxia since surgery -Will need to r/o PE by CTA - pending -OHS is clearly a consideration, but should not be so acute in development -May be associated with OSA - she does not wear CPAP at home, but sleeps sitting upright in a chair.  Here, she is in bed.  Will add CPAP if CTA is negative. 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  Our [facility] hospitalist team will follow the patient with you.   Time Spent: 50 minutes  Karmen Bongo M.D. [facility] 10/27/2018, 5:31 PM

## 2018-10-27 NOTE — Progress Notes (Signed)
Pt. Refused cpap. 

## 2018-10-27 NOTE — Clinical Social Work Note (Signed)
Clinical Social Work Assessment  Patient Details  Name: Melinda Kerr MRN: [id number] Date of Birth: 1934-11-16  Date of referral:  10/27/18               Reason for consult:  Discharge Planning                Permission sought to share information with:  Case Manager, Facility Sport and exercise psychologist, Family Supports Permission granted to share information::  Yes, Verbal Permission Granted  Name::     [name]  Agency::  SNFs  Relationship::  niece  Contact Information:  [phone]  Housing/Transportation Living arrangements for the past 2 months:  Single Family Home Source of Information:  Patient Patient Interpreter Needed:  None Criminal Activity/Legal Involvement Pertinent to Current Situation/Hospitalization:  No - Comment as needed Significant Relationships:  Adult Children, Siblings Lives with:  Self Do you feel safe going back to the place where you live?  No Need for family participation in patient care:  Yes (Comment)  Care giving concerns:  CSW received referral for possible SNF placement at time of discharge. Spoke with patient regarding possibility of SNF placement . Patient's  family  is currently unable to care for her at their home given patient's current needs and fall risk.  Patient and niece [name] expressed understanding of PT recommendation and are agreeable to SNF placement at time of discharge. CSW to continue to follow and assist with discharge planning needs.    Social Worker assessment / plan:  Spoke with patient and family at bedside (brother and nephew)  including niece [name] over the phone concerning possibility of rehab at [facility] before returning home.    Employment status:  Retired Forensic scientist:  Medicare PT Recommendations:  [place] / Referral to community resources:  [place]  Patient/Family's Response to care:  Patient and  family recognize need for rehab before returning home and are agreeable to a SNF  in [place]. They report preference for [facility]    . CSW explained insurance authorization process. Patient's family reported that they want patient to get stronger to be able to come back home.    Patient/Family's Understanding of and Emotional Response to Diagnosis, Current Treatment, and Prognosis:  Patient/family is realistic regarding therapy needs and expressed being hopeful for SNF placement. Patient expressed understanding of CSW role and discharge process as well as medical condition. No questions/concerns about plan or treatment.    Emotional Assessment Appearance:  Appears stated age Attitude/Demeanor/Rapport:  Gracious Affect (typically observed):  Accepting, Adaptable Orientation:  Oriented to Self, Oriented to Situation, Oriented to Place, Oriented to  Time Alcohol / Substance use:  Not Applicable Psych involvement (Current and /or in the community):  No (Comment)  Discharge Needs  Concerns to be addressed:  Discharge Planning Concerns Readmission within the last 30 days:  No Current discharge risk:  Dependent with Mobility Barriers to Discharge:  Continued Medical Work up   [employer], [place] 10/27/2018, 2:51 PM

## 2018-10-27 NOTE — Progress Notes (Signed)
Called Dr. Stann Mainland. Pt c/o SHOB, dyspnea, noted with decreased sats. Pt alert, mild distress noted. Alerted MD to situation. Administered nebulizer treatment and pt was assisted to chair by PT. Overall pt was able to ambulate, but continued to have SHOB. Continue to monitor.

## 2018-10-27 NOTE — Evaluation (Signed)
Physical Therapy Evaluation Patient Details Name: Melinda Kerr MRN: [id number] DOB: 1935-06-03 Today's Date: 10/27/2018   History of Present Illness  Pt is an 84 y.o. female who sustained R humeral fx in a fall. She underwent reverse TSA 10-26-18.   Clinical Impression  Pt admitted with above diagnosis. Pt currently with functional limitations due to the deficits listed below (see PT Problem List). PTA pt lived at home alone. Pt independent with mobility and ADLs, utilizing a cane for ambulation in the community. On eval, she required mod assist bed mobility, min assist sit to stand, and min/HHA ambulation 3 feet bed to recliner. Pt anxious on PT arrival due to c/o difficulty breathing. Pt on 4 L O2 with SpO2 96% with increased RR, wheezing and increased WOB noted. Pt agreeable to repositioning/mobility in attempts to assist with breathing. Pt on 4 L O2 throughout session with SpO2 >90%. RN present in room at end of session to administer a breathing treatment in the recliner. Pt reported feeling better in the recliner. RUE positioned on a pillow due to edema noted R hand. Pt will benefit from skilled PT to increase their independence and safety with mobility to allow discharge to the venue listed below.       Follow Up Recommendations SNF    Equipment Recommendations  Other (comment)(TBD at next venue)    Recommendations for Other Services       Precautions / Restrictions Precautions Precautions: Fall;Shoulder Shoulder Interventions: Shoulder sling/immobilizer Precaution Comments: Sling to remain in place until regional block wears off. Sling while sleeping x 6 weeks. OK to remove to use RW. Required Braces or Orthoses: Sling      Mobility  Bed Mobility Overal bed mobility: Needs Assistance Bed Mobility: Supine to Sit     Supine to sit: Mod assist;HOB elevated     General bed mobility comments: cues for sequencing, assist to elevate trunk, use of bed pad to scoot to  EOB  Transfers Overall transfer level: Needs assistance Equipment used: 1 person hand held assist Transfers: Sit to/from Stand Sit to Stand: Min assist         General transfer comment: assist to power up and stabilize standing balance  Ambulation/Gait Ambulation/Gait assistance: Min assist Gait Distance (Feet): 3 Feet Assistive device: 1 person hand held assist Gait Pattern/deviations: Wide base of support;Step-through pattern;Decreased stride length     General Gait Details: HHA on L to maintain balance bed to recliner  Stairs            Wheelchair Mobility    Modified Rankin (Stroke Patients Only)       Balance Overall balance assessment: Needs assistance Sitting-balance support: No upper extremity supported;Feet supported Sitting balance-Leahy Scale: Good     Standing balance support: Single extremity supported;During functional activity Standing balance-Leahy Scale: Poor Standing balance comment: reliant on external support                             Pertinent Vitals/Pain Pain Assessment: No/denies pain(due to nerve block)    Home Living Family/patient expects to be discharged to:: Private residence Living Arrangements: Alone Available Help at Discharge: Family;Available PRN/intermittently Type of Home: House Home Access: Stairs to enter   Entrance Stairs-Number of Steps: 1 Home Layout: One level Home Equipment: Walker - 4 wheels;Cane - single point;Shower seat      Prior Function Level of Independence: Independent with assistive device(s)  Comments: uses cane in community. Drives      Hand Dominance   Dominant Hand: Right    Extremity/Trunk Assessment   Upper Extremity Assessment Upper Extremity Assessment: Defer to OT evaluation    Lower Extremity Assessment Lower Extremity Assessment: Overall WFL for tasks assessed    Cervical / Trunk Assessment Cervical / Trunk Assessment: Normal  Communication    Communication: No difficulties  Cognition Arousal/Alertness: Awake/alert Behavior During Therapy: Anxious Overall Cognitive Status: Within Functional Limits for tasks assessed                                 General Comments: Pt anxious due to difficulty breathing. Pt on 4 L O2 with SpO2 96%.       General Comments      Exercises     Assessment/Plan    PT Assessment Patient needs continued PT services  PT Problem List Decreased balance;Decreased mobility;Obesity;Cardiopulmonary status limiting activity;Decreased activity tolerance       PT Treatment Interventions DME instruction;Functional mobility training;Balance training;Patient/family education;Gait training;Therapeutic activities;Therapeutic exercise    PT Goals (Current goals can be found in the Care Plan section)  Acute Rehab PT Goals Patient Stated Goal: independence PT Goal Formulation: With patient Time For Goal Achievement: 11/10/18 Potential to Achieve Goals: Good    Frequency Min 3X/week   Barriers to discharge Decreased caregiver support      Co-evaluation               AM-PAC PT "6 Clicks" Mobility  Outcome Measure Help needed turning from your back to your side while in a flat bed without using bedrails?: A Lot Help needed moving from lying on your back to sitting on the side of a flat bed without using bedrails?: A Lot Help needed moving to and from a bed to a chair (including a wheelchair)?: A Little Help needed standing up from a chair using your arms (e.g., wheelchair or bedside chair)?: A Little Help needed to walk in hospital room?: A Little Help needed climbing 3-5 steps with a railing? : A Lot 6 Click Score: 15    End of Session Equipment Utilized During Treatment: Oxygen Activity Tolerance: Patient tolerated treatment well Patient left: in chair;with call bell/phone within reach;with nursing/sitter in room Nurse Communication: Mobility status PT Visit Diagnosis: Other  abnormalities of gait and mobility (R26.89);Difficulty in walking, not elsewhere classified (R26.2)    Time: 6144-3154 PT Time Calculation (min) (ACUTE ONLY): 18 min   Charges:   PT Evaluation $PT Eval Moderate Complexity: 1 Mod          Lorrin Goodell, PT  Office # [phone] Pager [phone]   Lorriane Shire 10/27/2018, 10:52 AM

## 2018-10-27 NOTE — NC FL2 (Signed)
[place] LEVEL OF CARE SCREENING TOOL     IDENTIFICATION  Patient Name: Melinda Kerr Birthdate: Mar 26, 1935 Sex: female Admission Date (Current Location): 10/26/2018  [facility] and [place] Number:  Herbalist and Address:  [facility], [place] [address] [place], [place] [address]      Provider Number: [id number]  Attending Physician Name and Address:  Nicholes Stairs, MD  Relative Name and Phone Number:  [name] (niece) [phone]    Current Level of Care: Hospital Recommended Level of Care: [place] Prior Approval Number:    Date Approved/Denied: 10/10/13 PASRR Number: [id number]  Discharge Plan: SNF    Current Diagnoses: Patient Active Problem List   Diagnosis Date Noted  . Closed fracture of right proximal humerus 10/26/2018  . S/P reverse total shoulder arthroplasty, right 10/26/2018  . Acute appendicitis   . Diverticulitis of large intestine with abscess 07/13/2017  . Colitis 07/13/2017  . Aortic atherosclerosis ([place]) 09/12/2016  . Coronary artery calcification 09/12/2016  . Hiatal hernia 09/12/2016  . Lumbar disc disease 09/12/2016  . Chemotherapy-induced thrombocytopenia 08/04/2016  . Chemotherapy induced neutropenia ([place]) 05/26/2016  . Port catheter in place 05/26/2016  . Encounter for antineoplastic chemotherapy 05/26/2016  . Urinary frequency 04/24/2016  . Obstructive sleep apnea on CPAP 04/24/2016  . History of DVT (deep vein thrombosis) 04/24/2016  . Endometrial cancer, FIGO stage IIIC ([place]) 04/24/2016  . Malignant neoplasm of endometrium ([place]) 03/26/2016  . Dyspnea on exertion 03/19/2016  . Malignant neoplasm of uterus ([place]) 03/19/2016  . Status post right knee replacement 04/16/2014  . Essential hypertension 04/16/2014  . Postoperative anemia due to acute blood loss 04/12/2014  . Status post total left knee replacement 10/14/2013  . Thyroid activity decreased 10/14/2013  . UI  (urinary incontinence) 10/14/2013  . Dyslipidemia 10/14/2013  . Constipation 10/14/2013  . Gout 10/14/2013  . Anemia 10/14/2013  . Allergic rhinitis 10/14/2013  . Insomnia 10/14/2013  . Depression 10/14/2013  . OA (osteoarthritis) of knee 10/09/2013  . OSA on CPAP 08/10/2013  . Morbid obesity with BMI of 40.0-44.9, adult (HCC)     Orientation RESPIRATION BLADDER Height & Weight     Self, Time, Situation, Place  O2, Other (Comment)(nasa cannula 3L/min, occasional wheezing 6L/min simple mask, chronic nebulizer treatment) Continent, External catheter Weight: 112.9 kg Height:  5' (152.4 cm)  BEHAVIORAL SYMPTOMS/MOOD NEUROLOGICAL BOWEL NUTRITION STATUS      Continent Diet(see discharge summary)  AMBULATORY STATUS COMMUNICATION OF NEEDS Skin   Limited Assist Verbally Skin abrasions(abrasions left leg and right face, ecchymosis on abdomen, arm and face, MASD right abdomen, right shoulder closed surgical incision)                       Personal Care Assistance Level of Assistance  Bathing, Feeding, Dressing, Total care Bathing Assistance: Limited assistance Feeding assistance: Independent Dressing Assistance: Limited assistance Total Care Assistance: Limited assistance   Functional Limitations Info  Sight, Hearing, Speech Sight Info: Adequate Hearing Info: Adequate Speech Info: Adequate    SPECIAL CARE FACTORS FREQUENCY  PT (By licensed PT), OT (By licensed OT)     PT Frequency: min 5x weekly OT Frequency: min 5x weekly            Contractures Contractures Info: Not present    Additional Factors Info  Code Status, Allergies Code Status Info: full Allergies Info: Oxybutynin           Current Medications (10/27/2018):  This is the current hospital active medication list Current Facility-Administered Medications  Medication Dose Route Frequency Provider Last Rate Last Dose  . acetaminophen (TYLENOL) tablet 325-650 mg  325-650 mg Oral Q6H PRN Nicholes Stairs, MD      . acetaminophen (TYLENOL) tablet 500 mg  500 mg Oral Q6H Nicholes Stairs, MD   500 mg at 10/27/18 3536  . albuterol (PROVENTIL) (2.5 MG/3ML) 0.083% nebulizer solution 2.5 mg  2.5 mg Nebulization Q4H PRN Nicholes Stairs, MD   2.5 mg at 10/27/18 1035  . allopurinol (ZYLOPRIM) tablet 300 mg  300 mg Oral Q breakfast Nicholes Stairs, MD   300 mg at 10/27/18 0911  . calcium carbonate (OS-CAL - dosed in mg of elemental calcium) tablet 500 mg of elemental calcium  1 tablet Oral BID WC Nicholes Stairs, MD   500 mg of elemental calcium at 10/27/18 0912  . citalopram (CELEXA) tablet 20 mg  20 mg Oral Daily Nicholes Stairs, MD   20 mg at 10/27/18 0910  . docusate sodium (COLACE) capsule 100 mg  100 mg Oral BID Nicholes Stairs, MD   100 mg at 10/27/18 0910  . enoxaparin (LOVENOX) injection 30 mg  30 mg Subcutaneous Q24H Nicholes Stairs, MD   30 mg at 10/27/18 0913  . fesoterodine (TOVIAZ) tablet 4 mg  4 mg Oral Daily Nicholes Stairs, MD   4 mg at 10/27/18 1443  . furosemide (LASIX) tablet 20 mg  20 mg Oral Daily Nicholes Stairs, MD   20 mg at 10/27/18 0912  . HYDROcodone-acetaminophen (NORCO) 7.5-325 MG per tablet 1-2 tablet  1-2 tablet Oral Q4H PRN Nicholes Stairs, MD      . HYDROcodone-acetaminophen (NORCO/VICODIN) 5-325 MG per tablet 1-2 tablet  1-2 tablet Oral Q4H PRN Nicholes Stairs, MD      . hydrOXYzine (ATARAX/VISTARIL) tablet 25 mg  25 mg Oral QHS Nicholes Stairs, MD   25 mg at 10/26/18 2226  . levothyroxine (SYNTHROID, LEVOTHROID) tablet 125 mcg  125 mcg Oral Q0600 Nicholes Stairs, MD   125 mcg at 10/27/18 0511  . lisinopril (PRINIVIL,ZESTRIL) tablet 20 mg  20 mg Oral Q breakfast Nicholes Stairs, MD   Stopped at 10/27/18 0910  . loratadine (CLARITIN) tablet 10 mg  10 mg Oral Daily Nicholes Stairs, MD   10 mg at 10/27/18 1540  . Melatonin TABS 3 mg  3 mg Oral QHS Nicholes Stairs, MD   3 mg at  10/26/18 2227  . menthol-cetylpyridinium (CEPACOL) lozenge 3 mg  1 lozenge Oral PRN Nicholes Stairs, MD       Or  . phenol [facility]) mouth spray 1 spray  1 spray Mouth/Throat PRN Nicholes Stairs, MD   1 spray at 10/27/18 0805  . metoCLOPramide (REGLAN) tablet 5-10 mg  5-10 mg Oral Q8H PRN Nicholes Stairs, MD       Or  . metoCLOPramide [facility] injection 5-10 mg  5-10 mg Intravenous Q8H PRN Nicholes Stairs, MD      . morphine 2 MG/ML injection 0.5-1 mg  0.5-1 mg Intravenous Q2H PRN Nicholes Stairs, MD      . naproxen (NAPROSYN) tablet 250 mg  250 mg Oral BID WC Nicholes Stairs, MD   250 mg at 10/27/18 0867  . ondansetron (ZOFRAN) tablet 4 mg  4 mg Oral Q6H PRN Nicholes Stairs, MD       Or  .  ondansetron (ZOFRAN) injection 4 mg  4 mg Intravenous Q6H PRN Nicholes Stairs, MD      . pantoprazole (PROTONIX) EC tablet 40 mg  40 mg Oral Daily Nicholes Stairs, MD   40 mg at 10/27/18 0910  . polyethylene glycol (MIRALAX / GLYCOLAX) packet 17 g  17 g Oral Daily PRN Nicholes Stairs, MD      . pyridOXINE (VITAMIN B-6) tablet 100 mg  100 mg Oral Daily Nicholes Stairs, MD   100 mg at 10/27/18 0911  . traMADol (ULTRAM) tablet 50 mg  50 mg Oral Q6H Nicholes Stairs, MD   50 mg at 10/27/18 0511  . vitamin B-12 (CYANOCOBALAMIN) tablet 100 mcg  100 mcg Oral Daily Nicholes Stairs, MD   100 mcg at 10/27/18 0911  . vitamin C (ASCORBIC ACID) tablet 500 mg  500 mg Oral Daily Nicholes Stairs, MD   500 mg at 10/27/18 6440     Discharge Medications: Please see discharge summary for a list of discharge medications.  Relevant Imaging Results:  Relevant Lab Results:   Additional Information SSN: [id number]  Alberteen Sam, LCSW

## 2018-10-27 NOTE — Progress Notes (Signed)
Occupational Therapy Evaluation Patient Details Name: Melinda Kerr MRN: [id number] DOB: [birth year] Today's Date: 10/27/2018    History of Present Illness Pt is an 84 y.o. female who sustained R humeral fx in a fall. She underwent reverse TSA 10-26-18.    Clinical Impression   PTA pt PLOF Mod I with the use of AE in home setting. Pt is assisted at home from niece and lives home alone with limited support as required. Pt currently limited infunctional tasks due to dyspnea and limitation of RUE. Mod A in UB and LB dressing with VCs for sequencing and understanding protocol. Pt educated on shoulder protocol with use of sling and HEP with handout provided. Pt will benefit from acute OT for further assessment in functional tasks and address decreased function in ADLs for safe transition to next venue as recommended from physician. OT recommends DC setting of SNF due to decreased support at home and to increase strength and function/ independence for home setting.     Follow Up Recommendations  SNF;Supervision/Assistance - 24 hour    Equipment Recommendations       Recommendations for Other Services       Precautions / Restrictions Precautions Precautions: Fall;Shoulder Shoulder Interventions: Shoulder sling/immobilizer Precaution Booklet Issued: Yes (comment) Precaution Comments: Sling to remain in place until regional block wears off. AROM of elbow, wrist and hand. No AROM of shoulder. With ambulation use platform Wb on walker Required Braces or Orthoses: Sling Restrictions Weight Bearing Restrictions: No      Mobility Bed Mobility               General bed mobility comments: Next session to assess bed mobility once testing for PE and difficulty breathing is controlled.   Transfers                 General transfer comment: Next session to assess bed mobility once testing for PE and difficulty breathing is controlled.     Balance Overall balance assessment: Needs  assistance Sitting-balance support: No upper extremity supported;Feet supported Sitting balance-Leahy Scale: Good     Standing balance support: Single extremity supported;During functional activity Standing balance-Leahy Scale: Poor Standing balance comment: reliant on external support                           ADL either performed or assessed with clinical judgement   ADL Overall ADL's : Needs assistance/impaired Eating/Feeding: Set up   Grooming: Wash/dry hands;Wash/dry face;Oral care;Minimal assistance;Cueing for UE precautions   Upper Body Bathing: Moderate assistance;Cueing for sequencing;Cueing for safety;Adhering to UE precautions;Sitting;Cueing for UE precautions   Lower Body Bathing: Moderate assistance;Cueing for safety;Cueing for sequencing;Sit to/from stand   Upper Body Dressing : Moderate assistance;Cueing for sequencing;Cueing for UE precautions;Sitting   Lower Body Dressing: Moderate assistance;Cueing for safety;Cueing for sequencing;Sit to/from stand                 General ADL Comments: Pt unable to perform functional transfer due to difficulty breathing. Pt educated of protocal and precautions with visual handout provided with instructions.      Vision         Perception     Praxis      Pertinent Vitals/Pain Pain Assessment: Faces Pain Score: 0-No pain     Hand Dominance Right   Extremity/Trunk Assessment Upper Extremity Assessment Upper Extremity Assessment: RUE deficits/detail RUE: Unable to fully assess due to immobilization       Cervical /  Trunk Assessment Cervical / Trunk Assessment: Normal   Communication Communication Communication: No difficulties   Cognition Arousal/Alertness: Awake/alert Behavior During Therapy: Anxious(Difficulty breathing) Overall Cognitive Status: Within Functional Limits for tasks assessed                                 General Comments: Pt anxious due to difficulty  breathing. Pt on 4 L O2 with SpO2 96%.    General Comments  Pt reports going to SNF for DC    Exercises Exercises: Shoulder Shoulder Exercises Elbow Flexion: AROM;10 reps;Right Elbow Extension: AROM;10 reps;Right Wrist Flexion: AROM;5 reps;Right Wrist Extension: AROM;10 reps;Right Digit Composite Flexion: AROM;10 reps;Right Composite Extension: AROM;10 reps;Right   Shoulder Instructions      Home Living Family/patient expects to be discharged to:: Private residence Living Arrangements: Alone Available Help at Discharge: Family;Available PRN/intermittently Type of Home: House Home Access: Stairs to enter Entrance Stairs-Number of Steps: 1   Home Layout: One level     Bathroom Shower/Tub: Tub/shower unit;Walk-in shower   Bathroom Toilet: Standard     Home Equipment: Environmental consultant - 4 wheels;Cane - single point;Shower seat   Additional Comments: Plans for to DC to SNF      Prior Functioning/Environment Level of Independence: Independent with assistive device(s)        Comments: uses cane for community mobility but not within the home.        OT Problem List: Decreased safety awareness;Decreased range of motion;Decreased activity tolerance;Decreased knowledge of precautions      OT Treatment/Interventions: Self-care/ADL training;Therapeutic exercise;DME and/or AE instruction;Patient/family education    OT Goals(Current goals can be found in the care plan section) Acute Rehab OT Goals Patient Stated Goal: independence OT Goal Formulation: With patient Time For Goal Achievement: 11/10/18 Potential to Achieve Goals: Fair  OT Frequency: Min 3X/week   Barriers to D/C: Decreased caregiver support          Co-evaluation              AM-PAC OT "6 Clicks" Daily Activity     Outcome Measure Help from another person eating meals?: A Lot Help from another person taking care of personal grooming?: A Lot Help from another person toileting, which includes using  toliet, bedpan, or urinal?: A Lot Help from another person bathing (including washing, rinsing, drying)?: A Lot Help from another person to put on and taking off regular upper body clothing?: A Lot Help from another person to put on and taking off regular lower body clothing?: A Lot 6 Click Score: 12   End of Session Equipment Utilized During Treatment: Oxygen Nurse Communication: Precautions  Activity Tolerance: Patient limited by fatigue(treatment limited due to SOB.) Patient left: in bed;with call bell/phone within reach  OT Visit Diagnosis: Other (comment)                Time: 7622-6333 OT Time Calculation (min): 13 min Charges:  OT General Charges $OT Visit: 1 Visit OT Evaluation $OT Eval Moderate Complexity: [place], MSOT, OTR/L  Supplemental Rehabilitation Services  [phone]  Marius Ditch 10/27/2018, 3:23 PM

## 2018-10-27 NOTE — Progress Notes (Signed)
RT was called by the RN to attempt cpap with patient. RN stated that she had talked the pt. Into trying the cpap. RT attempted cpap with the patient but the pt. Could not tolerate the cpap. Pt.states she has not worn cpap in over 4 years due to the type of chair she rests in. RN was notified. Pt. Will remain on nasal cannula.

## 2018-10-27 NOTE — Progress Notes (Signed)
   Subjective:  Patient reports pain as mild.  No real complaints of pain in the right arm at this time.  Her biggest complaint is of some shortness of breath.  She is more short of breath and dyspneic while lying flat.  She is chair sitting upright with a nasal cannula at 2 L and seems to be more comfortable but does continue to drop her saturations when she has shallow breathing.  Otherwise denies any chest pain.  She denies nausea or vomiting.  Objective:   VITALS:   Vitals:   10/26/18 1945 10/26/18 2024 10/26/18 2211 10/27/18 0516  BP: (!) 125/57 (!) 105/57 (!) 130/55 (!) 95/57  Pulse: 91 79 66 (!) 59  Resp: (!) 24 18  18   Temp: 97.9 F (36.6 C) 98 F (36.7 C) 97.6 F (36.4 C) 97.8 F (36.6 C)  TempSrc:  Oral Oral Oral  SpO2: 99% 97% 98% 96%  Weight:      Height:        Neurologically intact Neurovascular intact Sensation intact distally Intact pulses distally Incision: dressing C/D/I   She has some sensory muscle use noted with diminished breathing.  Otherwise appears in no distress.  She is alert and oriented x3.  Good 2+ symmetric pulses at the wrist  Lab Results  Component Value Date   WBC 10.0 10/27/2018   HGB 9.4 (L) 10/27/2018   HCT 29.9 (L) 10/27/2018   MCV 99.3 10/27/2018   PLT 288 10/27/2018   BMET    Component Value Date/Time   NA 136 10/27/2018 0228   NA 138 03/01/2017 1325   K 5.2 (H) 10/27/2018 0228   K 4.2 03/01/2017 1325   CL 104 10/27/2018 0228   CO2 26 10/27/2018 0228   CO2 27 03/01/2017 1325   GLUCOSE 223 (H) 10/27/2018 0228   GLUCOSE 135 03/01/2017 1325   BUN 19 10/27/2018 0228   BUN 22.5 03/01/2017 1325   CREATININE 1.02 (H) 10/27/2018 0228   CREATININE 0.9 03/01/2017 1325   CALCIUM 8.2 (L) 10/27/2018 0228   CALCIUM 10.4 03/01/2017 1325   GFRNONAA 52 (L) 10/27/2018 0228   GFRAA 60 (L) 10/27/2018 0228     Assessment/Plan: 1 Day Post-Op   Principal Problem:   Closed fracture of right proximal humerus Active Problems:   S/P  reverse total shoulder arthroplasty, right   Advance diet Up with therapy  -Sling at all times unless doing occupational or physical therapy.  Okay for platform weightbearing to the right upper extremity on a walker.  -Surgical site appears okay and the block seems to be resolving with adequate pain control on oral medications.  -We will try to hold any narcotics at this time.    -Acute hypoxemia  Most likely an exacerbation of her mild stable chronic congestive heart failure.  She has chest x-ray this morning that has no obvious signs of fluid overload but clinically this may be the culprit.  We have consulted the hospitalist for their input and recommendations on treatment.  I appreciate their assistance.  We will follow-up on their recommendations.  BNP and CMP with magnesium ordered.  -Expected acute blood loss anemia   Stable at this time with no tachycardia or complaints of chest pain.  We will monitor.  Dispo:  SNF when medically able   Nicholes Stairs 10/27/2018, 11:06 AM   Geralynn Rile, MD [phone]

## 2018-10-27 NOTE — Progress Notes (Signed)
Pt up sitting in chair. Pt continues to have some dyspnea. TR consult and labs ordered per MD, as well as hospitalist consult. Pt visiting with family at this time. Continue to monitor.

## 2018-10-28 ENCOUNTER — Inpatient Hospital Stay (HOSPITAL_COMMUNITY): Payer: Medicare Other

## 2018-10-28 DIAGNOSIS — R0602 Shortness of breath: Secondary | ICD-10-CM

## 2018-10-28 LAB — CBC
HCT: 28.9 % — ABNORMAL LOW (ref 36.0–46.0)
Hemoglobin: 9.2 g/dL — ABNORMAL LOW (ref 12.0–15.0)
MCH: 31.6 pg (ref 26.0–34.0)
MCHC: 31.8 g/dL (ref 30.0–36.0)
MCV: 99.3 fL (ref 80.0–100.0)
Platelets: 349 10*3/uL (ref 150–400)
RBC: 2.91 MIL/uL — ABNORMAL LOW (ref 3.87–5.11)
RDW: 14.2 % (ref 11.5–15.5)
WBC: 14.7 10*3/uL — AB (ref 4.0–10.5)
nRBC: 0 % (ref 0.0–0.2)

## 2018-10-28 LAB — COMPREHENSIVE METABOLIC PANEL
ALBUMIN: 2.3 g/dL — AB (ref 3.5–5.0)
ALT: 29 U/L (ref 0–44)
AST: 36 U/L (ref 15–41)
Alkaline Phosphatase: 149 U/L — ABNORMAL HIGH (ref 38–126)
Anion gap: 8 (ref 5–15)
BUN: 33 mg/dL — ABNORMAL HIGH (ref 8–23)
CO2: 29 mmol/L (ref 22–32)
Calcium: 9.9 mg/dL (ref 8.9–10.3)
Chloride: 99 mmol/L (ref 98–111)
Creatinine, Ser: 0.85 mg/dL (ref 0.44–1.00)
GFR calc Af Amer: >60 mL/min (ref >60)
GFR calc non Af Amer: >60 mL/min (ref >60)
Glucose, Bld: 166 mg/dL — ABNORMAL HIGH (ref 70–99)
Potassium: 4.9 mmol/L (ref 3.5–5.1)
Sodium: 136 mmol/L (ref 135–145)
Total Bilirubin: 0.9 mg/dL (ref 0.3–1.2)
Total Protein: 5.1 g/dL — ABNORMAL LOW (ref 6.5–8.1)

## 2018-10-28 LAB — OCCULT BLOOD X 1 CARD TO LAB, STOOL: Fecal Occult Bld: NEGATIVE

## 2018-10-28 LAB — MAGNESIUM: Magnesium: 1.8 mg/dL (ref 1.7–2.4)

## 2018-10-28 MED ORDER — SENNA 8.6 MG PO TABS
2.0000 | ORAL_TABLET | Freq: Every day | ORAL | Status: DC
  Administered 2018-10-28 – 2018-10-30 (×3): 17.2 mg via ORAL
  Filled 2018-10-28 (×3): qty 2

## 2018-10-28 MED ORDER — ALUM & MAG HYDROXIDE-SIMETH 200-200-20 MG/5ML PO SUSP
30.0000 mL | Freq: Four times a day (QID) | ORAL | Status: DC | PRN
  Administered 2018-10-28: 30 mL via ORAL
  Filled 2018-10-28: qty 30

## 2018-10-28 MED ORDER — POLYETHYLENE GLYCOL 3350 17 G PO PACK
17.0000 g | PACK | Freq: Two times a day (BID) | ORAL | Status: DC
  Administered 2018-10-28 – 2018-10-31 (×6): 17 g via ORAL
  Filled 2018-10-28 (×7): qty 1

## 2018-10-28 NOTE — Progress Notes (Signed)
Occupational Therapy Treatment Patient Details Name: Melinda Kerr MRN: [id number] DOB: 1935-09-11 Today's Date: 10/28/2018    History of present illness Pt is an 84 y.o. female who sustained R humeral fx in a fall. She underwent reverse TSA 10-26-18.    OT comments  Pt progressing towards OT goals this session, max A to adjust sling and Pt able to perform short ambulation into bathroom for toilet transfer with mod A, max A for peri care utilizing grab bars for sit <>stand. Pt also educated on PROM protocol as listed below. Due to body habitus and chronic back pain, Pt struggles to perform pendulums (attempted and unable this session). She requires external assist from therapist for PROM (unable to reach across body for left arm to help right). Pt able to perform hand, wrist, elbow exercises at supervision level in chair. Pt is motivated to continue to work with therapy.   Pt will require WIDE equipment throughout hospital and SNF stay. WIDE BSC in room now.   O2 saturations remained above 91 throughout session.   Follow Up Recommendations  SNF;Supervision/Assistance - 24 hour    Equipment Recommendations  Other (comment)(defer to next venue of care)    Recommendations for Other Services      Precautions / Restrictions Precautions Precautions: Fall;Shoulder Type of Shoulder Precautions: passive protocol Shoulder Interventions: Shoulder sling/immobilizer;At all times;Off for dressing/bathing/exercises Precaution Booklet Issued: Yes (comment) Precaution Comments: reviewed shoulder dc handout in full and reviewed HEP (included PROM this session) Required Braces or Orthoses: Sling(6 weeks) Restrictions Weight Bearing Restrictions: Yes RUE Weight Bearing: Non weight bearing Other Position/Activity Restrictions: (Dr. Stann Mainland would like to maintain NWB but RW okay if needed )       Mobility Bed Mobility Overal bed mobility: Needs Assistance Bed Mobility: Supine to Sit     Supine to  sit: Mod assist;HOB elevated     General bed mobility comments: cues for sequencing; use of rail going toward L side of bed; assist to bring R hip to EOB with bed pad and to elevate trunk into sitting   Transfers Overall transfer level: Needs assistance Equipment used: (HHA and use of gait belt) Transfers: Sit to/from Stand Sit to Stand: Min assist;+2 safety/equipment;Min guard;From elevated surface         General transfer comment: increased assist required from EOB; use of grab bar to stand from commode with min guard/min A for balance    Balance Overall balance assessment: Needs assistance Sitting-balance support: No upper extremity supported;Feet supported Sitting balance-Leahy Scale: Good     Standing balance support: Single extremity supported;During functional activity Standing balance-Leahy Scale: Poor Standing balance comment: reliant on external support                           ADL either performed or assessed with clinical judgement   ADL Overall ADL's : Needs assistance/impaired     Grooming: Wash/dry hands;Set up;Sitting Grooming Details (indicate cue type and reason): Pt unable to maintain standing at sink for grooming                 Toilet Transfer: Moderate assistance;Ambulation Toilet Transfer Details (indicate cue type and reason): 1 person HHA, use of grab bars Toileting- Clothing Manipulation and Hygiene: Maximal assistance;Sit to/from stand Toileting - Clothing Manipulation Details (indicate cue type and reason): Pt able to stand using grab bars and peri care performed by OT     Functional mobility during ADLs: Moderate assistance(1 person  HHA)       Vision   Additional Comments: reports no changes in vision   Perception     Praxis      Cognition Arousal/Alertness: Awake/alert Behavior During Therapy: WFL for tasks assessed/performed Overall Cognitive Status: Within Functional Limits for tasks assessed                                           Exercises Exercises: Shoulder Shoulder Exercises Pendulum Exercise: Right(educated gentle pendulums, unable to demonstrate today) Shoulder Flexion: PROM;Right;Supine(educated to 90, requires external assist due to body habitus) Shoulder ABduction: PROM;Right;Seated(educated to 60, requires external assist due to body habitus) Shoulder External Rotation: PROM;Right;Seated(educated to 30, requires external assist due to body habitus) Elbow Flexion: AROM;Right;10 reps;Seated Wrist Flexion: AROM;Right;10 reps;Seated Wrist Extension: AROM;Right;10 reps;Seated Digit Composite Flexion: AROM;Right;10 reps;Seated Composite Extension: AROM;Right;10 reps;Seated Neck Flexion: AROM Neck Extension: AROM Neck Lateral Flexion - Right: AROM Neck Lateral Flexion - Left: AROM   Shoulder Instructions Shoulder Instructions Donning/doffing shirt without moving shoulder: Maximal assistance Method for sponge bathing under operated UE: Maximal assistance Correct positioning of sling/immobilizer: Maximal assistance ROM for elbow, wrist and digits of operated UE: Supervision/safety Sling wearing schedule (on at all times/off for ADL's): Independent     General Comments SpO2 maintained >91% on RA throughout session    Pertinent Vitals/ Pain       Pain Assessment: Faces Faces Pain Scale: Hurts little more Pain Location: R shoulder, hip, and back Pain Descriptors / Indicators: Sore Pain Intervention(s): Limited activity within patient's tolerance;Monitored during session;Repositioned  Home Living                                          Prior Functioning/Environment              Frequency  Min 3X/week        Progress Toward Goals  OT Goals(current goals can now be found in the care plan section)  Progress towards OT goals: Progressing toward goals  Acute Rehab OT Goals Patient Stated Goal: independence OT Goal Formulation: With  patient Time For Goal Achievement: 11/10/18 Potential to Achieve Goals: [place] Discharge plan remains appropriate;Frequency remains appropriate    Co-evaluation    PT/OT/SLP Co-Evaluation/Treatment: Yes Reason for Co-Treatment: For patient/therapist safety;To address functional/ADL transfers PT goals addressed during session: Mobility/safety with mobility;Balance OT goals addressed during session: ADL's and self-care;Strengthening/ROM      AM-PAC OT "6 Clicks" Daily Activity     Outcome Measure   Help from another person eating meals?: A Little Help from another person taking care of personal grooming?: A Little Help from another person toileting, which includes using toliet, bedpan, or urinal?: A Lot Help from another person bathing (including washing, rinsing, drying)?: A Lot Help from another person to put on and taking off regular upper body clothing?: A Lot Help from another person to put on and taking off regular lower body clothing?: A Lot 6 Click Score: 14    End of Session Equipment Utilized During Treatment: Gait belt  OT Visit Diagnosis: Pain Pain - Right/Left: Right Pain - part of body: Shoulder   Activity Tolerance Patient tolerated treatment well   Patient Left in chair;with call bell/phone within reach;with nursing/sitter in room   Nurse Communication Mobility status;Precautions(wide [facility]  now in room for Pt to use, please dc purewick)        Time: 9450-3888 OT Time Calculation (min): 35 min  Charges: OT General Charges $OT Visit: 1 Visit OT Treatments $Self Care/Home Management : 8-22 mins  Hulda Humphrey OTR/L Acute Rehabilitation Services Pager: [phone] Office: [place] 10/28/2018, 4:59 PM

## 2018-10-28 NOTE — Progress Notes (Signed)
   Subjective:  Patient reports pain as mild.  No real complaints of pain in the right arm.  The nerve block has resolved.  She denies any shortness of breath and feels as though she is reading much easier today.  She did have one episode of dark emesis early this morning.  She states it was related to the sausage that she ate.  No further episodes.  She has passed flatus as well as having 2 bowel movements today.  She denies chest pain.  No shortness of breath.  Objective:   VITALS:   Vitals:   10/27/18 1450 10/27/18 1954 10/28/18 0426 10/28/18 1354  BP:  (!) 111/96 (!) 128/56 (!) 137/57  Pulse:  89 84 77  Resp:  (!) 21 (!) 24 18  Temp:  97.7 F (36.5 C) 98 F (36.7 C) 97.9 F (36.6 C)  TempSrc:  Oral Oral Oral  SpO2:  100% 96% 100%  Weight: 122.8 kg     Height:        Neurologically intact Neurovascular intact Sensation intact distally Intact pulses distally Incision: dressing C/D/I   Breathing comfortably  Good 2+ symmetric pulses at the wrist  Lab Results  Component Value Date   WBC 14.7 (H) 10/28/2018   HGB 9.2 (L) 10/28/2018   HCT 28.9 (L) 10/28/2018   MCV 99.3 10/28/2018   PLT 349 10/28/2018   BMET    Component Value Date/Time   NA 136 10/28/2018 0921   NA 138 03/01/2017 1325   K 4.9 10/28/2018 0921   K 4.2 03/01/2017 1325   CL 99 10/28/2018 0921   CO2 29 10/28/2018 0921   CO2 27 03/01/2017 1325   GLUCOSE 166 (H) 10/28/2018 0921   GLUCOSE 135 03/01/2017 1325   BUN 33 (H) 10/28/2018 0921   BUN 22.5 03/01/2017 1325   CREATININE 0.85 10/28/2018 0921   CREATININE 0.9 03/01/2017 1325   CALCIUM 9.9 10/28/2018 0921   CALCIUM 10.4 03/01/2017 1325   GFRNONAA >60 10/28/2018 0921   GFRAA >60 10/28/2018 0921     Assessment/Plan: 2 Days Post-Op   Principal Problem:   S/P reverse total shoulder arthroplasty, right Active Problems:   OSA on CPAP   Morbid obesity with BMI of 45.0-49.9, adult (HCC)   Essential hypertension   Dyspnea on  exertion   Advance diet Up with therapy  -Sling at all times unless doing occupational or physical therapy.  Okay for weightbearing to the right upper extremity on a walker.  -Surgical site appears okay and the block seems to be resolving with adequate pain control on oral medications.  -Hold narcotics.  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  This has resolved at this time.  We will continue PRN breathing treatments.  PE study was negative.  -Expected acute blood loss anemia   Stable at this time with no tachycardia or complaints of chest pain.  We will monitor.  -Medical management:   Appreciate the medicine service for their following along and helping to manage her chronic stable as well as acute exacerbations of medical conditions.  We appreciate their recommendations.  Dispo:  SNF when able   Nicholes Stairs 10/28/2018, 3:47 PM   Geralynn Rile, MD [phone]

## 2018-10-28 NOTE — Care Management Important Message (Signed)
Important Message  Patient Details  Name: Melinda Kerr MRN: [id number] Date of Birth: [birth year]   Medicare Important Message Given:  Yes    Treyden Hakim 10/28/2018, 3:08 PM

## 2018-10-28 NOTE — Plan of Care (Signed)

## 2018-10-28 NOTE — Progress Notes (Signed)
  Echocardiogram 2D Echocardiogram has been performed.  Honore Wipperfurth T Yeshua Stryker 10/28/2018, 1:57 PM

## 2018-10-28 NOTE — Progress Notes (Addendum)
**Note De-Identified vi Obfusction** Consult NOTE    Melinda Kerr  JKK:[id number] DOB: [birth year] DOA: 10/26/2018 PCP: Mury Dus, MD   Requesting physicin: [facility] - orthopedics  Reson for consulttion: Needs 2-4L O2 here in the office.  Possibly phrenic nerve injury following the nerve block.  Also with bseline OSA nd morbid obesity.  Request evlution of hypoxi.          History of Present Illness: Melinda Kerr is n 84 y.o. femle with h/o uterine CA; DM; OSA on CPAP; morbid obesity (BMI 49.5); HTN; HLD; hypothyroidism; nd CHD presenting with fll nd proximl humerus fx.  She hd shoulder replcement yesterdy.  She reports feeling well except for problems with her brething nd wheezing.  She hs not hd this before.  It strted while she hs been in the hospitl.  She hs not received  brething tretment.  She did get " medicine" lst night nd tht helped.  Tody, she ws hving  bth nd it got worse.  Cough some lst night tht ws productive nd she ws given  pill nd the cough resolved.  She fell Tuesdy night  week go - they brought her to the ER but she ws splinted nd dischrged with pln for return for surgery.  Assessment & Pln:   Principl Problem:   S/P reverse totl shoulder rthroplsty, right Active Problems:   OSA on CPAP   Morbid obesity with BMI of 45.0-49.9, dult (HCC)   Essentil hypertension   Dyspne on exertion   R shoulder rthroplsty -Frcture lst week, reverse shoulder rthroplsty on 1/29 -Mngement per orthopedics, Dr. Corine Shelter  Dyspne  Acute Hypoxic Respirtory Filure -Currently requiring 1 L by Plys, wen s tolerted -suspect this is multifctoril, likely relted to lrge hitl herni nd possible phrenic nerve block with intersclene block yesterdy.  Her CT showed R sided telectsis nd lrge hitl herni.  She hs elevted BNP, but not grossly volume overloded, will follow up echocrdiogrm tody.   - Continue IS, flutter vlve, mobilize  -  continue orl lsix - CTA without PE - she refused CPAP lst night, unble to tolerte this  Drk Emesis  Distended Stomch on Imging: Per chrt review, nursing noted smll mount of "blck" emesis this AM.  Stomch ws distended on imging.  She notes feeling better since episode of emesis, no recurrent emesis.+Fltus, no BM yet.  Will follow FOBT nd hemoglobin, though pt ppers stble Continue orl PPI dily Bowel regimen Mobilize Consider GI consulttion if recurrent nd concern for GI bleed  Morbid obesity -Weight loss should be encourged but is going to be very chllenging t her ge  HTN -Continue Lisinopril, lsix  OSA -As noted bove, does not wer CPAP t home becuse she sleeps upright in  chir  HLD: prvsttin Overctive Bldder: toviz Hypothyroidism: synthroid  Depression: celex Gout: llopurinol  Follow pending lbs for tody  DVT prophylxis: lovenox Code Sttus: full  Fmily Communiction: none t bedside Disposition Pln: per surgery, pending improvement in respirtory sttus   Consultnts:   Ortho Surgery is primry  Procedures:   Reverse shoulder rthroplsty on 1/29  Antimicrobils: Anti-infectives (From dmission, onwrd)   Strt     Dose/Rte Route Frequency Ordered Stop   10/27/18 0600  ceFAZolin (ANCEF) IVPB 2g/100 mL premix     2 g 200 mL/hr over 30 Minutes Intrvenous On cll to O.R. 10/26/18 1245 10/26/18 1618   10/26/18 2030  ceFAZolin (ANCEF) IVPB 2g/100 mL premix     2 g 200 mL/hr over **Note De-Identified vi Obfusction** 30 Minutes Intrvenous Every 6 hours 10/26/18 2027 10/27/18 1000   10/26/18 1654  vncomycin (VNCOCIN) powder  Sttus:  Discontinued       s needed 10/26/18 1655 10/26/18 1825     Subjective: Emesis this morning, felt  bit better fter tht Still feels SOB.  Objective: Vitls:   10/27/18 1422 10/27/18 1450 10/27/18 1954 10/28/18 0426  BP: (!) 121/51  (!) 111/96 (!) 128/56  Pulse: 69  89 84  Resp: 16  (!) 21 (!) 24  Temp: 97.6  F (36.4 C)  97.7 F (36.5 C) 98 F (36.7 C)  TempSrc: Orl  Orl Orl  SpO2: 99%  100% 96%  Weight:  122.8 kg    Height:        Intke/Output Summry (Lst 24 hours) t 10/28/2018 0919 Lst dt filed t 10/28/2018 0429 Gross per 24 hour  Intke 660 ml  Output 1150 ml  Net -490 ml   Filed Weights   10/26/18 1302 10/27/18 1450  Weight: 112.9 kg 122.8 kg    Exmintion:  Generl exm: ppers clm nd comfortble, obese Respirtory system: [place] Prk in plce, difficult exm due to body hbitus, Cler to usculttion. Incresed wob. Crdiovsculr system: S1 & S2 herd, RRR.  Gstrointestinl system: bdomen is nondistended, soft nd nontender Centrl nervous system: lert nd oriented. No focl neurologicl deficits. Extremities: moving ll extremities, no LEE Skin: No rshes, lesions or ulcers Psychitry: Judgement nd insight pper norml. Mood & ffect pproprite.     Dt Reviewed: I hve personlly reviewed following lbs nd imging studies  CBC: Recent Lbs  Lb 10/26/18 1328 10/27/18 0228 10/27/18 1129  WBC 7.6 10.0 12.1*  HGB 10.7* 9.4* 9.4*  HCT 34.4* 29.9* 30.5*  MCV 99.7 99.3 100.0  PLT 305 288 073   Bsic Metbolic Pnel: Recent Lbs  Lb 10/26/18 1328 10/27/18 0228 10/27/18 1129  N 138 136 136  K 4.4 5.2* 4.8  CL 102 104 102  CO2 27 26 25   GLUCOSE 121* 223* 163*  BUN 19 19 22   CRETININE 0.80 1.02* 1.14*  CLCIUM 8.7* 8.2* 8.7*  MG  --   --  2.0   GFR: Estimted Cretinine Clernce: 46.7 mL/min () (by C-G formul bsed on SCr of 1.14 mg/dL (H)). Liver Function Tests: Recent Lbs  Lb 10/27/18 1129  ST 63*  LT 56*  LKPHOS 171*  BILITOT 0.7  PROT 5.2*  LBUMIN 2.5*   No results for input(s): LIPSE, MYLSE in the lst 168 hours. No results for input(s): MMONI in the lst 168 hours. Cogultion Profile: No results for input(s): INR, PROTIME in the lst 168 hours. Crdic Enzymes: No results for input(s): CKTOTL, CKMB,  CKMBINDEX, TROPONINI in the lst 168 hours. BNP (lst 3 results) No results for input(s): PROBNP in the lst 8760 hours. Hb1C: Recent Lbs    10/26/18 1329  HGB1C 6.2*   CBG: Recent Lbs  Lb 10/26/18 1310 10/26/18 1834  GLUCP 93 121*   Lipid Profile: No results for input(s): CHOL, HDL, LDLCLC, TRIG, CHOLHDL, LDLDIRECT in the lst 72 hours. Thyroid Function Tests: No results for input(s): TSH, T4TOTL, FREET4, T3FREE, THYROIDB in the lst 72 hours. nemi Pnel: No results for input(s): VITMINB12, FOLTE, FERRITIN, TIBC, IRON, RETICCTPCT in the lst 72 hours. Sepsis Lbs: No results for input(s): PROCLCITON, LTICCIDVEN in the lst 168 hours.  No results found for this or ny previous visit (from the pst 240 hour(s)).       Rdiology Studies: Ct ngio Chest Pe **Note De-Identified vi Obfusction** W Or Wo Contrst  Result Dte: 10/27/2018 CLINICL DT:  29 yer old femle with cute shortness of breth. Recent shoulder replcement. EXM: CT NGIOGRPHY CHEST WITH CONTRST TECHNIQUE: Multidetector CT imging of the chest ws performed using the stndrd protocol during bolus dministrtion of intrvenous contrst. Multiplnr CT imge reconstructions nd MIPs were obtined to evlute the vsculr ntomy. CONTRST:  162mL ISOVUE-370 IOPMIDOL (ISOVUE-370) INJECTION 76% COMPRISON:  10/26/2026 chest rdiogrph, 03/24/2016 chest CT nd other studies. FINDINGS: Crdiovsculr: This is  techniclly borderline study for the evlution of pulmonry emboli due to strek nd respirtory motion rtifct. No definite pulmonry emboli re identified. Crdiomegly gin identified. Coronry rtery nd ortic therosclerotic clcifictions re present. No evidence of thorcic ortic neurysm or pericrdil effusion. Medistinum/Nodes:  lrge hitl herni is gin noted with distended stomch nd esophgus. No enlrged lymph nodes or medistinl mss noted. Lungs/Pleur: Incresing RIGHT LOWER lobe telectsis noted in  prt due to very lrge hitl herni which contins  distended stomch. Moderte RIGHT UPPER lobe telectsis is identified. Mild LEFT bsilr telectsis is present. No definite irspce disese or consolidtion noted. No pleurl effusion or pneumothorx. Upper bdomen: No cute bnormlity Musculoskeletl: RIGHT shoulder rthroplsty chnges identified. No cute bony bnormlities re otherwise noted. Review of the MIP imges confirms the bove findings. IMPRESSION: 1. No definite evidence of pulmonry emboli. 2. Moderte RIGHT UPPER lobe telectsis nd incresing RIGHT LOWER lobe telectsis. Incresing RIGHT LOWER lobe telectsis is in prt due to very lrge hitl herni which now contins  distended stomch. 3. Crdiomegly 4. Coronry rtery nd ortic therosclerosis (ICD10-I70.0). Electroniclly Signed   By: Mrgrette Cnd M.D.   On: 10/27/2018 17:59   Dg Chest Port 1 View  Result Dte: 10/26/2018 CLINICL DT:  Dyspne. EXM: PORTBLE CHEST 1 VIEW COMPRISON:  Rdiogrph 08/24/2016 FINDINGS: Lower lung volumes from prior exm. Crdiomegly is similr. ortic therosclerosis. Vsculr congestion without evidence pulmonry edem. Streky right perihilr nd left lung bse telectsis. No lrge pleurl effusion or pneumothorx. Right shoulder rthroplsty prtilly included. IMPRESSION: 1. Low lung volumes with crdiomegly nd vsculr congestion. 2. Streky right perihilr nd left lung bse telectsis. Electroniclly Signed   By: Keith Rke M.D.   On: 10/26/2018 23:48   Dg Shoulder Right Port  Result Dte: 10/26/2018 CLINICL DT:  Sttus post reversed totl shoulder rthroplsty. EXM: PORTBLE RIGHT SHOULDER COMPRISON:  Rdiogrphs of Jnury 21, 2020. FINDINGS: The glenoid nd humerl components of the prosthesis re well situted. No disloction is noted. IMPRESSION: Sttus post right shoulder rthroplsty. Electroniclly Signed   By: Mrijo Conception, M.D.   On: 10/26/2018 19:30         Scheduled Meds: . llopurinol  300 mg Orl Q brekfst  . clcium crbonte  1 tblet Orl BID WC  . citloprm  20 mg Orl Dily  . docuste sodium  100 mg Orl BID  . enoxprin (LOVENOX) injection  30 mg Subcutneous Q24H  . fesoterodine  4 mg Orl Dily  . furosemide  20 mg Orl Dily  . hydrOXYzine  25 mg Orl QHS  . levothyroxine  125 mcg Orl Q0600  . lisinopril  20 mg Orl Q brekfst  . lortdine  10 mg Orl Dily  . Meltonin  3 mg Orl QHS  . nproxen  250 mg Orl BID WC  . pntoprzole  40 mg Orl Dily  . pyridOXINE  100 mg Orl Dily  . trMDol  50 mg Orl Q6H  . vitmin B-12  100 mcg Orl Dily  . vitmin C  500 mg Oral Daily   Continuous Infusions:   LOS: 2 days    Time spent: over 30 min    Fayrene Helper, MD [facility] Pager AMION  If 7PM-7AM, please contact night-coverage [email or web address] Password [facility] 10/28/2018, 9:19 AM

## 2018-10-28 NOTE — Progress Notes (Signed)
Patient complaining of filling full with nothing to eat since around breakfast time 1/30. Also complaining of indigestion and has thrown up a small amount with emesis being black. At the moment patient has some labored & shallow breathing, wheezing and tachypnic. Currently only 1L oxygen via nasal cannula with O2 ranging around 93%. No complaints of pain.

## 2018-10-28 NOTE — Progress Notes (Signed)
Pt refusing CPAP at this time. Pt states she tried to wear the previous night, but did not tolerate. RN aware. RT will continue to monitor.

## 2018-10-28 NOTE — Progress Notes (Signed)
Physical Therapy Treatment Patient Details Name: Melinda Kerr MRN: [id number] DOB: [birth year] Today's Date: 10/28/2018    History of Present Illness Pt is an 84 y.o. female who sustained R humeral fx in a fall. She underwent reverse TSA 10-26-18.     PT Comments    Patient seen for mobility progression. Pt is pleasant and agreeable to participate in therapy. Pt is making progress toward PT goals and tolerated mobility well with SpO2 maintained >91% on RA throughout session. Pt is able to ambulate ~25 ft total in room with mod A for balance and HHA. Continue to progress as tolerated with anticipated d/c to SNF for further skilled PT services.     Follow Up Recommendations  SNF     Equipment Recommendations  Other (comment)(TBD at next venue)    Recommendations for Other Services       Precautions / Restrictions Precautions Precautions: Fall;Shoulder Shoulder Interventions: Shoulder sling/immobilizer Required Braces or Orthoses: Sling(6 weeks) Restrictions Weight Bearing Restrictions: Yes RUE Weight Bearing: Non weight bearing Other Position/Activity Restrictions: (Dr. Stann Mainland would like to maintain NWB but RW okay if needed )    Mobility  Bed Mobility Overal bed mobility: Needs Assistance Bed Mobility: Supine to Sit     Supine to sit: Mod assist;HOB elevated     General bed mobility comments: cues for sequencing; use of rail going toward L side of bed; assist to bring R hip to EOB with bed pad and to elevate trunk into sitting   Transfers Overall transfer level: Needs assistance Equipment used: (HHA and use of gait belt) Transfers: Sit to/from Stand Sit to Stand: Min assist;+2 safety/equipment;Min guard;From elevated surface         General transfer comment: increased assist required from EOB; use of grab bar to stand from commode with min guard/min A for balance  Ambulation/Gait Ambulation/Gait assistance: Mod assist Gait Distance (Feet): (14 ft then 10  ft) Assistive device: 1 person hand held assist Gait Pattern/deviations: Wide base of support;Step-through pattern;Decreased stride length(lateral sway due to body habitus) Gait velocity: decreased   General Gait Details: assist for balance; cues for upright posture; pt maintains flexed posture and reports due to back pain   Stairs             Wheelchair Mobility    Modified Rankin (Stroke Patients Only)       Balance Overall balance assessment: Needs assistance Sitting-balance support: No upper extremity supported;Feet supported Sitting balance-Leahy Scale: Good     Standing balance support: Single extremity supported;During functional activity Standing balance-Leahy Scale: Poor Standing balance comment: reliant on external support                            Cognition Arousal/Alertness: Awake/alert Behavior During Therapy: WFL for tasks assessed/performed Overall Cognitive Status: Within Functional Limits for tasks assessed                                        Exercises      General Comments General comments (skin integrity, edema, etc.): SpO2 maintained >91% on RA throughout session      Pertinent Vitals/Pain Pain Assessment: Faces Faces Pain Scale: Hurts little more Pain Location: R shoulder, hip, and back Pain Descriptors / Indicators: Sore Pain Intervention(s): Limited activity within patient's tolerance;Monitored during session;Repositioned    Home Living  Prior Function            PT Goals (current goals can now be found in the care plan section) Acute Rehab PT Goals Patient Stated Goal: independence Progress towards PT goals: Progressing toward goals    Frequency    Min 3X/week      PT Plan Current plan remains appropriate    Co-evaluation PT/OT/SLP Co-Evaluation/Treatment: Yes Reason for Co-Treatment: For patient/therapist safety;To address functional/ADL transfers PT  goals addressed during session: Mobility/safety with mobility;Balance        AM-PAC PT "6 Clicks" Mobility   Outcome Measure  Help needed turning from your back to your side while in a flat bed without using bedrails?: A Lot Help needed moving from lying on your back to sitting on the side of a flat bed without using bedrails?: A Lot Help needed moving to and from a bed to a chair (including a wheelchair)?: A Little Help needed standing up from a chair using your arms (e.g., wheelchair or bedside chair)?: A Little Help needed to walk in hospital room?: A Little Help needed climbing 3-5 steps with a railing? : A Lot 6 Click Score: 15    End of Session Equipment Utilized During Treatment: Gait belt Activity Tolerance: Patient tolerated treatment well Patient left: in chair;with call bell/phone within reach Nurse Communication: Mobility status PT Visit Diagnosis: Other abnormalities of gait and mobility (R26.89);Difficulty in walking, not elsewhere classified (R26.2)     Time: 5056-9794 PT Time Calculation (min) (ACUTE ONLY): 35 min  Charges:  $Gait Training: 8-22 mins                     Earney Navy, PTA Acute Rehabilitation Services Pager: [phone] Office: [phone]     Darliss Cheney 10/28/2018, 4:24 PM

## 2018-10-29 LAB — ECHOCARDIOGRAM COMPLETE
Height: 60 in
Weight: 4331.6 oz

## 2018-10-29 LAB — COMPREHENSIVE METABOLIC PANEL
ALT: 22 U/L (ref 0–44)
AST: 28 U/L (ref 15–41)
Albumin: 2.1 g/dL — ABNORMAL LOW (ref 3.5–5.0)
Alkaline Phosphatase: 133 U/L — ABNORMAL HIGH (ref 38–126)
Anion gap: 6 (ref 5–15)
BUN: 29 mg/dL — ABNORMAL HIGH (ref 8–23)
CO2: 31 mmol/L (ref 22–32)
Calcium: 10.4 mg/dL — ABNORMAL HIGH (ref 8.9–10.3)
Chloride: 99 mmol/L (ref 98–111)
Creatinine, Ser: 0.92 mg/dL (ref 0.44–1.00)
GFR calc Af Amer: >60 mL/min (ref >60)
GFR calc non Af Amer: 58 mL/min — ABNORMAL LOW (ref >60)
Glucose, Bld: 113 mg/dL — ABNORMAL HIGH (ref 70–99)
Potassium: 5.1 mmol/L (ref 3.5–5.1)
Sodium: 136 mmol/L (ref 135–145)
Total Bilirubin: 0.7 mg/dL (ref 0.3–1.2)
Total Protein: 5.1 g/dL — ABNORMAL LOW (ref 6.5–8.1)

## 2018-10-29 LAB — CBC
HCT: 28.3 % — ABNORMAL LOW (ref 36.0–46.0)
Hemoglobin: 8.9 g/dL — ABNORMAL LOW (ref 12.0–15.0)
MCH: 30.6 pg (ref 26.0–34.0)
MCHC: 31.4 g/dL (ref 30.0–36.0)
MCV: 97.3 fL (ref 80.0–100.0)
PLATELETS: 347 10*3/uL (ref 150–400)
RBC: 2.91 MIL/uL — ABNORMAL LOW (ref 3.87–5.11)
RDW: 14.2 % (ref 11.5–15.5)
WBC: 11.7 10*3/uL — ABNORMAL HIGH (ref 4.0–10.5)
nRBC: 0 % (ref 0.0–0.2)

## 2018-10-29 LAB — TSH: TSH: 0.785 u[IU]/mL (ref 0.350–4.500)

## 2018-10-29 LAB — MAGNESIUM: Magnesium: 1.8 mg/dL (ref 1.7–2.4)

## 2018-10-29 NOTE — Progress Notes (Addendum)
Occupational Therapy Treatment Patient Details Name: Melinda Kerr MRN: [id number] DOB: [birth year] Today's Date: 10/29/2018    History of present illness Pt is an 84 y.o. female who sustained R humeral fx in a fall. She underwent reverse TSA 10-26-18.    OT comments  Pt. Seen for skilled OT. Declines oob today stating she is very tired. Agreeable to cont. Work with P/AROM to RUE.  Able to return demo with intermittent cues.    Follow Up Recommendations       Equipment Recommendations    SNF/24 7 S   Recommendations for Other Services      Precautions / Restrictions Precautions Precautions: Fall;Shoulder Type of Shoulder Precautions: passive protocol Shoulder Interventions: Shoulder sling/immobilizer;At all times;Off for dressing/bathing/exercises Precaution Comments: reviewed shoulder dc handout in full and reviewed HEP (included PROM this session) Restrictions RUE Weight Bearing: Non weight bearing       Mobility Bed Mobility                  Transfers                      Balance                                           ADL either performed or assessed with clinical judgement   ADL                                               Vision       Perception     Praxis      Cognition Arousal/Alertness: Awake/alert Behavior During Therapy: WFL for tasks assessed/performed Overall Cognitive Status: Within Functional Limits for tasks assessed                                          Exercises Shoulder Exercises Shoulder Flexion: PROM;Right;Supine Elbow Flexion: AROM;Right;10 reps;Seated Elbow Extension: AROM;10 reps;Right Wrist Flexion: AROM;Right;10 reps;Seated Wrist Extension: AROM;Right;10 reps;Seated Digit Composite Flexion: AROM;Right;10 reps;Seated Composite Extension: AROM;Right;10 reps;Seated Neck Flexion: AROM Neck Extension: AROM Neck Lateral Flexion - Right: AROM Neck Lateral  Flexion - Left: AROM   Shoulder Instructions       General Comments      Pertinent Vitals/ Pain       Pain Assessment: 0-10 Pain Score: 5  Pain Location: R shoulder Pain Descriptors / Indicators: Aching Pain Intervention(s): Limited activity within patient's tolerance;Monitored during session;Repositioned  Home Living                                          Prior Functioning/Environment              Frequency           Progress Toward Goals  OT Goals(current goals can now be found in the care plan section)  Progress towards OT goals: Progressing toward goals     Plan      Co-evaluation                 AM-PAC OT "  6 Clicks" Daily Activity     Outcome Measure                    End of Session        Activity Tolerance     Patient Left     Nurse Communication          Time: 1010-1021 OT Time Calculation (min): 11 min  Charges: OT General Charges $OT Visit: 1 Visit OT Treatments $Therapeutic Exercise: 8-22 mins   Janice Coffin, COTA/L 10/29/2018, 10:22 AM

## 2018-10-29 NOTE — Progress Notes (Signed)
Orthopedics Progress Note  Subjective: No new complaints this AM. Patient states that she is comfortable as long as they don't move it  Objective:  Vitals:   10/29/18 0516 10/29/18 0824  BP: 97/84 101/60  Pulse: 88 84  Resp:  16  Temp: 98.6 F (37 C) 98.1 F (36.7 C)  SpO2: 91% 95%    General: Awake and alert  Musculoskeletal: Right shoulder and arm with expected swelling and bruising, dressing intact Neurovascularly intact distally. No pain with AROM of the left arm.  Lab Results  Component Value Date   WBC 11.7 (H) 10/29/2018   HGB 8.9 (L) 10/29/2018   HCT 28.3 (L) 10/29/2018   MCV 97.3 10/29/2018   PLT 347 10/29/2018       Component Value Date/Time   NA 136 10/29/2018 0431   NA 138 03/01/2017 1325   K 5.1 10/29/2018 0431   K 4.2 03/01/2017 1325   CL 99 10/29/2018 0431   CO2 31 10/29/2018 0431   CO2 27 03/01/2017 1325   GLUCOSE 113 (H) 10/29/2018 0431   GLUCOSE 135 03/01/2017 1325   BUN 29 (H) 10/29/2018 0431   BUN 22.5 03/01/2017 1325   CREATININE 0.92 10/29/2018 0431   CREATININE 0.9 03/01/2017 1325   CALCIUM 10.4 (H) 10/29/2018 0431   CALCIUM 10.4 03/01/2017 1325   GFRNONAA 58 (L) 10/29/2018 0431   GFRAA >60 10/29/2018 0431    Lab Results  Component Value Date   INR 1.00 04/19/2018   INR 1.05 05/15/2016   INR 1.09 04/02/2014    Assessment/Plan: POD #3 s/p Procedure(s): REVERSE SHOULDER ARTHROPLASTY for displaced fracture Recommend continued therapy and discharge planning Stable from Ortho standpoint for discharge to SNF once bed available  Remo Lipps R. Veverly Fells, MD 10/29/2018 8:49 AM

## 2018-10-29 NOTE — Plan of Care (Signed)

## 2018-10-29 NOTE — Progress Notes (Signed)
This is the third night pt has declined cpap. Rt will now dc order. RT will monitor.

## 2018-10-29 NOTE — Progress Notes (Addendum)
Consult NOTE    Melinda Kerr  MVH:[id number] DOB: Jul 17, 1935 DOA: 10/26/2018 PCP: Maury Dus, MD   Requesting physician: Stann Mainland - orthopedics  Reason for consultation: Needs 2-4L O2 here in the office.  Possibly phrenic nerve injury following the nerve block.  Also with baseline OSA and morbid obesity.  Request evaluation of hypoxia.          History of Present Illness: Melinda Kerr is an 84 y.o. female with h/o uterine CA; DM; OSA on CPAP; morbid obesity (BMI 49.5); HTN; HLD; hypothyroidism; and CHD presenting with fall and proximal humerus fx.  She had shoulder replacement yesterday.  She reports feeling well except for problems with her breathing and wheezing.  She has not had this before.  It started while she has been in the hospital.  She has not received Brandi Armato breathing treatment.  She did get "Kerstie Agent medicine" last night and that helped.  Today, she was having Thai Hemrick bath and it got worse.  Cough some last night that was productive and she was given Khalee Mazo pill and the cough resolved.  She fell Tuesday night Chavela Justiniano week ago - they brought her to the ER but she was splinted and discharged with plan for return for surgery.  Assessment & Plan:   Principal Problem:   S/P reverse total shoulder arthroplasty, right Active Problems:   OSA on CPAP   Morbid obesity with BMI of 45.0-49.9, adult (HCC)   Essential hypertension   Dyspnea on exertion  Final Recommendations Seems improved from respiratory standpoint, would follow up repeat chest x ray as outpatient. Addendum: echo with diastolic dysfunction, with increased velocities across all valves, will follow TSH (I'll touch base with cardiology, but I still think with her improved sx, she's likely appropriate for d/c at this point) Should be ok for discharge from medicine standpoint as well. Continue OOB, mobilize, bowel regimen. Would recommend limiting scheduled NSAID given her ACEi for BP. Recommend outpatient follow up of hypercalcemia/mildly  elevated LFT's here.  Stopped calcium supplementation here. Recommend outpatient PCP follow up for repeat labs/imaging. Recommend outpatient cardiology follow up for diastolic dysfunction, elevated PA pressures   Please call if questions (will update note when echo is read). Will continue to follow while in house.  R shoulder arthroplasty -Fracture last week, reverse shoulder arthroplasty on 1/29 -Management per orthopedics, Dr. Corine Shelter -she's on scheduled naproxen for pain, would recommend limiting duration of this given her ACEi  Dyspnea  Acute Hypoxic Respiratory Failure -Resolved, now on RA -suspect this was multifactorial, likely related to large hiatal hernia and possible phrenic nerve block with interscalene block on day of surgery.  Her CT showed R sided atelectasis and large hiatal hernia.  She had elevated BNP, but is not grossly volume overloaded on exam.   -Her echo is pending, will follow up results - Continue IS, flutter valve, mobilize  - continue oral lasix - CTA without PE - she refused CPAP last night, unable to tolerate this  Dark Emesis  Distended Stomach on Imaging: Per chart review, nursing noted small amount of "black" emesis 1/31 AM.  Stomach was distended on imaging.   No recurrent emesis, + BM yesterday. FOBT negative, unlikely GI bleed.  ?related to sausage (color?). Continue oral PPI daily Bowel regimen Mobilize Low suspicion for GI bleed with negative FOBT and lack of recurrence.  Follow outpatient as needed.  Hypercalcemia: corrected to 11.3 yesterday, 11.9 today.  Will stop calcium supplementation.  Check PTH.  Will follow.  Morbid  obesity -encourage weight loss  HTN -Continue Lisinopril, lasix  OSA -As noted above, does not wear CPAP at home because she sleeps upright in [place] chair  HLD: pravastatin Overactive Bladder: toviaz Hypothyroidism: synthroid  Depression: celexa Gout: allopurinol Mildly elevated Liver Enzymes: outpatient follow  up  Follow pending labs for today  DVT prophylaxis: lovenox Code Status: full  Family Communication: none at bedside Disposition Plan: per surger   Consultants:   Ortho Surgery is primary  Procedures:   Reverse shoulder arthroplasty on 1/29  Antimicrobials: Anti-infectives (From admission, onward)   Start     Dose/Rate Route Frequency Ordered Stop   10/27/18 0600  ceFAZolin (ANCEF) IVPB 2g/100 mL premix     2 g 200 mL/hr over 30 Minutes Intravenous On call to O.R. 10/26/18 1245 10/26/18 1618   10/26/18 2030  ceFAZolin (ANCEF) IVPB 2g/100 mL premix     2 g 200 mL/hr over 30 Minutes Intravenous Every 6 hours 10/26/18 2027 10/27/18 1000   10/26/18 1654  vancomycin (VANCOCIN) powder  Status:  Discontinued       As needed 10/26/18 1655 10/26/18 1825     Subjective: Feeling better. Pain when she's moved around.  Objective: Vitals:   10/28/18 1654 10/28/18 2029 10/29/18 0516 10/29/18 0824  BP:  108/79 97/84 101/60  Pulse: 87 86 88 84  Resp: 18 16  16   Temp:  98.3 F (36.8 C) 98.6 F (37 C) 98.1 F (36.7 C)  TempSrc:  Oral Oral Oral  SpO2: 97% 93% 91% 95%  Weight:      Height:        Intake/Output Summary (Last 24 hours) at 10/29/2018 0950 Last data filed at 10/29/2018 0900 Gross per 24 hour  Intake 600 ml  Output 1500 ml  Net -900 ml   Filed Weights   10/26/18 1302 10/27/18 1450  Weight: 112.9 kg 122.8 kg    Examination:  General: No acute distress. Cardiovascular: Heart sounds show Wofford Stratton regular rate, and rhythm Lungs: Clear to auscultation bilaterally, difficult exam due to body habitus, but appears more comfortable than yesterday Abdomen: Soft, nontender, nondistended Neurological: Alert and oriented 3. Moves all extremities 4. Cranial nerves II through XII grossly intact. Skin: Warm and dry. No rashes or lesions. Extremities: R arm in sling Psychiatric: Mood and affect are normal. Insight and judgment are appropriate.    Data Reviewed: I have  personally reviewed following labs and imaging studies  CBC: Recent Labs  Lab 10/26/18 1328 10/27/18 0228 10/27/18 1129 10/28/18 0921 10/29/18 0431  WBC 7.6 10.0 12.1* 14.7* 11.7*  HGB 10.7* 9.4* 9.4* 9.2* 8.9*  HCT 34.4* 29.9* 30.5* 28.9* 28.3*  MCV 99.7 99.3 100.0 99.3 97.3  PLT 305 288 313 349 570   Basic Metabolic Panel: Recent Labs  Lab 10/26/18 1328 10/27/18 0228 10/27/18 1129 10/28/18 0921 10/29/18 0431  NA 138 136 136 136 136  K 4.4 5.2* 4.8 4.9 5.1  CL 102 104 102 99 99  CO2 27 26 25 29 31   GLUCOSE 121* 223* 163* 166* 113*  BUN 19 19 22  33* 29*  CREATININE 0.80 1.02* 1.14* 0.85 0.92  CALCIUM 8.7* 8.2* 8.7* 9.9 10.4*  MG  --   --  2.0 1.8 1.8   GFR: Estimated Creatinine Clearance: 57.8 mL/min (by C-G formula based on SCr of 0.92 mg/dL). Liver Function Tests: Recent Labs  Lab 10/27/18 1129 10/28/18 0921 10/29/18 0431  AST 63* 36 28  ALT 56* 29 22  ALKPHOS 171* 149* 133*  BILITOT 0.7 0.9 0.7  PROT 5.2* 5.1* 5.1*  ALBUMIN 2.5* 2.3* 2.1*   No results for input(s): LIPASE, AMYLASE in the last 168 hours. No results for input(s): AMMONIA in the last 168 hours. Coagulation Profile: No results for input(s): INR, PROTIME in the last 168 hours. Cardiac Enzymes: No results for input(s): CKTOTAL, CKMB, CKMBINDEX, TROPONINI in the last 168 hours. BNP (last 3 results) No results for input(s): PROBNP in the last 8760 hours. HbA1C: Recent Labs    10/26/18 1329  HGBA1C 6.2*   CBG: Recent Labs  Lab 10/26/18 1310 10/26/18 1834  GLUCAP 93 121*   Lipid Profile: No results for input(s): CHOL, HDL, LDLCALC, TRIG, CHOLHDL, LDLDIRECT in the last 72 hours. Thyroid Function Tests: No results for input(s): TSH, T4TOTAL, FREET4, T3FREE, THYROIDAB in the last 72 hours. Anemia Panel: No results for input(s): VITAMINB12, FOLATE, FERRITIN, TIBC, IRON, RETICCTPCT in the last 72 hours. Sepsis Labs: No results for input(s): PROCALCITON, LATICACIDVEN in the last 168  hours.  No results found for this or any previous visit (from the past 240 hour(s)).       Radiology Studies: Ct Angio Chest Pe W Or Wo Contrast  Result Date: 10/27/2018 CLINICAL DATA:  84 year old female with acute shortness of breath. Recent shoulder replacement. EXAM: CT ANGIOGRAPHY CHEST WITH CONTRAST TECHNIQUE: Multidetector CT imaging of the chest was performed using the standard protocol during bolus administration of intravenous contrast. Multiplanar CT image reconstructions and MIPs were obtained to evaluate the vascular anatomy. CONTRAST:  127mL ISOVUE-370 IOPAMIDOL (ISOVUE-370) INJECTION 76% COMPARISON:  10/26/2026 chest radiograph, 03/24/2016 chest CT and other studies. FINDINGS: Cardiovascular: This is Sianne Tejada technically borderline study for the evaluation of pulmonary emboli due to streak and respiratory motion artifact. No definite pulmonary emboli are identified. Cardiomegaly again identified. Coronary artery and aortic atherosclerotic calcifications are present. No evidence of thoracic aortic aneurysm or pericardial effusion. Mediastinum/Nodes: Snigdha Howser large hiatal hernia is again noted with distended stomach and esophagus. No enlarged lymph nodes or mediastinal mass noted. Lungs/Pleura: Increasing RIGHT LOWER lobe atelectasis noted in part due to very large hiatal hernia which contains Lynsee Wands distended stomach. Moderate RIGHT UPPER lobe atelectasis is identified. Mild LEFT basilar atelectasis is present. No definite airspace disease or consolidation noted. No pleural effusion or pneumothorax. Upper Abdomen: No acute abnormality Musculoskeletal: RIGHT shoulder arthroplasty changes identified. No acute bony abnormalities are otherwise noted. Review of the MIP images confirms the above findings. IMPRESSION: 1. No definite evidence of pulmonary emboli. 2. Moderate RIGHT UPPER lobe atelectasis and increasing RIGHT LOWER lobe atelectasis. Increasing RIGHT LOWER lobe atelectasis is in part due to very large  hiatal hernia which now contains Sayde Lish distended stomach. 3. Cardiomegaly 4. Coronary artery and Aortic Atherosclerosis (ICD10-I70.0). Electronically Signed   By: Margarette Canada M.D.   On: 10/27/2018 17:59        Scheduled Meds: . allopurinol  300 mg Oral Q breakfast  . calcium carbonate  1 tablet Oral BID WC  . citalopram  20 mg Oral Daily  . docusate sodium  100 mg Oral BID  . enoxaparin (LOVENOX) injection  30 mg Subcutaneous Q24H  . fesoterodine  4 mg Oral Daily  . furosemide  20 mg Oral Daily  . hydrOXYzine  25 mg Oral QHS  . levothyroxine  125 mcg Oral Q0600  . lisinopril  20 mg Oral Q breakfast  . loratadine  10 mg Oral Daily  . Melatonin  3 mg Oral QHS  . naproxen  250 mg Oral BID  WC  . pantoprazole  40 mg Oral Daily  . polyethylene glycol  17 g Oral BID  . pyridOXINE  100 mg Oral Daily  . senna  2 tablet Oral QHS  . traMADol  50 mg Oral Q6H  . vitamin B-12  100 mcg Oral Daily  . vitamin C  500 mg Oral Daily   Continuous Infusions:   LOS: 3 days    Time spent: over 30 min    Fayrene Helper, MD [facility] Pager [email or web address]  If 7PM-7AM, please contact night-coverage [email or web address] Password [email or web address] 10/29/2018, 9:50 AM

## 2018-10-30 LAB — CBC
HCT: 26.2 % — ABNORMAL LOW (ref 36.0–46.0)
HEMOGLOBIN: 8.6 g/dL — AB (ref 12.0–15.0)
MCH: 32.2 pg (ref 26.0–34.0)
MCHC: 32.8 g/dL (ref 30.0–36.0)
MCV: 98.1 fL (ref 80.0–100.0)
Platelets: 354 10*3/uL (ref 150–400)
RBC: 2.67 MIL/uL — ABNORMAL LOW (ref 3.87–5.11)
RDW: 14.5 % (ref 11.5–15.5)
WBC: 10.7 10*3/uL — ABNORMAL HIGH (ref 4.0–10.5)
nRBC: 0.3 % — ABNORMAL HIGH (ref 0.0–0.2)

## 2018-10-30 LAB — BASIC METABOLIC PANEL
Anion gap: 10 (ref 5–15)
BUN: 27 mg/dL — ABNORMAL HIGH (ref 8–23)
CALCIUM: 9.4 mg/dL (ref 8.9–10.3)
CO2: 29 mmol/L (ref 22–32)
Chloride: 96 mmol/L — ABNORMAL LOW (ref 98–111)
Creatinine, Ser: 1.03 mg/dL — ABNORMAL HIGH (ref 0.44–1.00)
GFR calc Af Amer: 59 mL/min — ABNORMAL LOW (ref >60)
GFR calc non Af Amer: 51 mL/min — ABNORMAL LOW (ref >60)
GLUCOSE: 110 mg/dL — AB (ref 70–99)
Potassium: 5 mmol/L (ref 3.5–5.1)
Sodium: 135 mmol/L (ref 135–145)

## 2018-10-30 LAB — PTH, INTACT AND CALCIUM
Calcium, Total (PTH): 10.1 mg/dL (ref 8.7–10.3)
PTH: 10 pg/mL — ABNORMAL LOW (ref 15–65)

## 2018-10-30 LAB — ALBUMIN: Albumin: 2.3 g/dL — ABNORMAL LOW (ref 3.5–5.0)

## 2018-10-30 NOTE — Progress Notes (Signed)
Vital signs taken prior to transferring Pt from chair to bed, 1 assist, R shoulder sling in place, chair locked in place, RN assisted Pt to stand up but when Pt pushed on chair, chair gave way and slid backwards, Pt fell on the floor, hit left side of the head on the drawer next to bed. Vital signs taken and recorded post fall, Pt denies pain on the head, no swelling, cut or bleeding upon assessment, no injuries noted. Pt assisted to chair and transferred back to bed. Relative at bedside. MD notified. Niece notified and updated. Rechecked Pt at 18:20, Pt denies pain, vital signs taken and recorded, will continue to monitor.

## 2018-10-30 NOTE — Progress Notes (Signed)
Occupational Therapy Treatment Patient Details Name: Melinda Kerr MRN: [id number] DOB: 08-25-1935 Today's Date: 10/30/2018    History of present illness Pt is an 84 y.o. female who sustained R humeral fx in a fall. She underwent reverse TSA 10-26-18.    OT comments  Pt. Able to complete bed mobility, stand pivot transfer, and grooming tasks min/mod a.  Encouraged to complete rom throughout the day.  Note d/c likely to snf in next day or so per pt. Report.   Follow Up Recommendations  SNF;Supervision/Assistance - 24 hour    Equipment Recommendations       Recommendations for Other Services      Precautions / Restrictions Precautions Precautions: Fall;Shoulder Type of Shoulder Precautions: passive protocol Shoulder Interventions: Shoulder sling/immobilizer;At all times;Off for dressing/bathing/exercises Precaution Comments: reviewed shoulder dc handout in full and reviewed HEP (included PROM this session) Required Braces or Orthoses: Sling Restrictions Weight Bearing Restrictions: Yes RUE Weight Bearing: Non weight bearing       Mobility Bed Mobility Overal bed mobility: Needs Assistance Bed Mobility: Supine to Sit     Supine to sit: Mod assist;HOB elevated        Transfers Overall transfer level: Needs assistance Equipment used: 1 person hand held assist;None Transfers: Sit to/from [facility] Sit to Stand: Min assist Stand pivot transfers: Mod assist       General transfer comment: cues for safety. attempting to scoot very far off of edge of bed. reviewed fall risk and encouraged pushing through b les to stand up    Balance                                           ADL either performed or assessed with clinical judgement   ADL Overall ADL's : Needs assistance/impaired     Grooming: Oral care;Set up;Sitting                   Toilet Transfer: Minimal assistance;Stand-pivot Toilet Transfer Details (indicate cue  type and reason): 1 person HHA, simulated during transfer from eob to recliner  (pt. was on bedpan upon arrival, total assist for clean up rolling R and bedpan removal) Toileting- Clothing Manipulation and Hygiene: Maximal assistance;Sit to/from stand       Functional mobility during ADLs: Moderate assistance       Vision       Perception     Praxis      Cognition Arousal/Alertness: Awake/alert Behavior During Therapy: WFL for tasks assessed/performed Overall Cognitive Status: Within Functional Limits for tasks assessed                                          Exercises     Shoulder Instructions       General Comments      Pertinent Vitals/ Pain       Pain Assessment: Faces Faces Pain Scale: Hurts little more Pain Location: R shoulder (during pivot) Pain Descriptors / Indicators: Aching  Home Living                                          Prior Functioning/Environment  Frequency  Min 3X/week        Progress Toward Goals  OT Goals(current goals can now be found in the care plan section)  Progress towards OT goals: Progressing toward goals     Plan Discharge plan remains appropriate;Frequency remains appropriate    Co-evaluation                 AM-PAC OT "6 Clicks" Daily Activity     Outcome Measure   Help from another person eating meals?: A Little Help from another person taking care of personal grooming?: A Little Help from another person toileting, which includes using toliet, bedpan, or urinal?: A Lot Help from another person bathing (including washing, rinsing, drying)?: A Lot Help from another person to put on and taking off regular upper body clothing?: A Lot Help from another person to put on and taking off regular lower body clothing?: A Lot 6 Click Score: 14    End of Session    OT Visit Diagnosis: Pain Pain - Right/Left: Right Pain - part of body: Shoulder   Activity  Tolerance Patient tolerated treatment well   Patient Left in chair;with call bell/phone within reach   Nurse Communication Other (comment)(notified that pt. needed purewik placement)        Time: 1001-1013 OT Time Calculation (min): 12 min  Charges: OT General Charges $OT Visit: 1 Visit OT Treatments $Self Care/Home Management : 8-22 mins   Janice Coffin, COTA/L 10/30/2018, 11:22 AM

## 2018-10-30 NOTE — Progress Notes (Signed)
Patient ID: Melinda Kerr, female   DOB: 02-28-1935, 84 y.o.   MRN: [id number] Subjective: 4 Days Post-Op Procedure(s) (LRB): REVERSE SHOULDER ARTHROPLASTY (Right)    Patient reports pain as mild.  Right shoulder doing ok, no events Did have question about sutures above right eye, placed Tuesday 1/28  Objective:   VITALS:   Vitals:   10/29/18 1955 10/30/18 0558  BP: 102/69 (!) 97/53  Pulse: 80 75  Resp:    Temp: 98.7 F (37.1 C) 98.4 F (36.9 C)  SpO2: 94% 91%    Neurovascular intact Incision: dressing C/D/I over right shoulder Right shoulder in sling with ice on it this am Right forehead laceration without drainage, healing, suture line intact  LABS Recent Labs    10/28/18 0921 10/29/18 0431 10/30/18 0311  HGB 9.2* 8.9* 8.6*  HCT 28.9* 28.3* 26.2*  WBC 14.7* 11.7* 10.7*  PLT 349 347 354    Recent Labs    10/28/18 0921 10/29/18 0431 10/30/18 0311  NA 136 136 135  K 4.9 5.1 5.0  BUN 33* 29* 27*  CREATININE 0.85 0.92 1.03*  GLUCOSE 166* 113* 110*    No results for input(s): LABPT, INR in the last 72 hours.   Assessment/Plan: 4 Days Post-Op Procedure(s) (LRB): REVERSE SHOULDER ARTHROPLASTY (Right)   Up with therapy Plan for discharge tomorrow Discharge to SNF, likely tomorrow  Will remind Stann Mainland about sutures above right eye so he can make appropriate arrangements to have them removed within 7-10 days from placement.  Otherwise shoulder therapy and follow up per Stann Mainland

## 2018-10-30 NOTE — Treatment Plan (Signed)
Called to bedside by nursing due to fall. TRH had previously signed off, requested primary service be made aware of fall. Evaluated patient at bedside.   On discussion with nursing, sounds like on standing from chair, rolled out from underneath her. She hit the L side of her head on the bedside table and hit her bottom on the floor. She denies hitting her back, right arm or shoulder. No loss of consciousness.  Vitals reviewed and stable HEENT without new bruising (bruising under R eye stable, stitches over R eye) PERRL No TTP of L arm, bilateral legs, stable pelvis.  Exam of RUE with pain, seems appropriate post op. CN 2-12 intact, intact sensation, good strength to all extremities (RUE limited with recent surgery).  Pt with mechanical fall. No concerning findings suggestive of need for new imaging at this time. Will continue to monitor. Chaplain as pt recently given bad news about Oviya Ammar close friend passing.

## 2018-10-30 NOTE — Plan of Care (Signed)

## 2018-10-30 NOTE — Progress Notes (Signed)
Consult NOTE    Melinda Kerr  JJK:[id number] DOB: April 15, 1935 DOA: 10/26/2018 PCP: Maury Dus, MD   Requesting physician: Stann Mainland - orthopedics  Reason for consultation: Needs 2-4L O2 here in the office.  Possibly phrenic nerve injury following the nerve block.  Also with baseline OSA and morbid obesity.  Request evaluation of hypoxia.          History of Present Illness: Melinda Kerr is an 84 y.o. female with h/o uterine CA; DM; OSA on CPAP; morbid obesity (BMI 49.5); HTN; HLD; hypothyroidism; and CHD presenting with fall and proximal humerus fx.  She had shoulder replacement yesterday.  She reports feeling well except for problems with her breathing and wheezing.  She has not had this before.  It started while she has been in the hospital.  She has not received a breathing treatment.  She did get "a medicine" last night and that helped.  Today, she was having a bath and it got worse.  Cough some last night that was productive and she was given a pill and the cough resolved.  She fell Tuesday night a week ago - they brought her to the ER but she was splinted and discharged with plan for return for surgery.  Assessment & Plan:   Principal Problem:   S/P reverse total shoulder arthroplasty, right Active Problems:   OSA on CPAP   Morbid obesity with BMI of 45.0-49.9, adult (HCC)   Essential hypertension   Dyspnea on exertion  Final Recommendations Seems improved from respiratory standpoint 1. Follow up repeat CXR outpatient 2. Would recommend cardiology outpatient follow up with echo with diastolic dysfunction and elevated PASP 3. Would avoid NSAIDs at discharge as she's on chronic ACEi 4. For hypercalcemia, continue to avoid calcium supplementation.  Improving.  Follow up with PCP as outpatient.  May need additional work up if persistent. 5. Ensure PCP follow up to review labs/imaging (also follow up mildly elevated LFT's here) Will sign off for now, please call if  questions  R shoulder arthroplasty -Fracture last week, reverse shoulder arthroplasty on 1/29 -Management per orthopedics, Dr. Corine Shelter -Will discontinue scheduled NSAID given ACEi  Dyspnea  Acute Hypoxic Respiratory Failure -Resolved, now on RA -suspect this was multifactorial, likely related to large hiatal hernia and possible phrenic nerve block with interscalene block on day of surgery.  Her CT showed R sided atelectasis and large hiatal hernia.  She had elevated BNP, but is not grossly volume overloaded on exam.   - diastolic dysfunction, elevated PASP (see report), will recommend outpatient cardiology follow up - Continue IS, flutter valve, mobilize  - continue oral lasix - CTA without PE - she refused CPAP last night, unable to tolerate this  Dark Emesis  Distended Stomach on Imaging: Per chart review, nursing noted small amount of "black" emesis 1/31 AM.  Stomach was distended on imaging.   No recurrent emesis, + BM yesterday. FOBT negative, unlikely GI bleed.  ?related to sausage (color?). Continue oral PPI daily Bowel regimen Mobilize Low suspicion for GI bleed with negative FOBT and lack of recurrence.  Follow outpatient as needed.  Hypercalcemia: Improved today, but still elevated.  Follow PTH (suggestive of non PTH mediated hypercalcemia).  Recommend continue to hold calcium supplements, follow up calcium as outpatient.  May need continued work up outpatient if persistently elevated.   Morbid obesity -encourage weight loss  HTN -Continue Lisinopril, lasix  OSA -As noted above, does not wear CPAP at home because she sleeps upright in  a chair  HLD: pravastatin Overactive Bladder: toviaz Hypothyroidism: synthroid  Depression: celexa Gout: allopurinol Mildly elevated Liver Enzymes: outpatient follow up  Follow pending labs for today  DVT prophylaxis: lovenox Code Status: full  Family Communication: none at bedside Disposition Plan: per  surger   Consultants:   Ortho Surgery is primary  Procedures:   Reverse shoulder arthroplasty on 1/29  Echo Study Conclusions  - Left ventricle: The cavity size was normal. Systolic function was   normal. The estimated ejection fraction was in the range of 60%   to 65%. Wall motion was normal; there were no regional wall   motion abnormalities. Features are consistent with a pseudonormal   left ventricular filling pattern, with concomitant abnormal   relaxation and increased filling pressure (grade 2 diastolic   dysfunction). - Mitral valve: Mildly calcified annulus. Mild, late   systolicprolapse, involving the anterior leaflet. - Left atrium: The atrium was moderately dilated. - Right ventricle: The cavity size was mildly dilated. Wall   thickness was normal. - Right atrium: The atrium was mildly dilated. - Pulmonary arteries: Systolic pressure was mildly increased. PA   peak pressure: 37 mm Hg (S). - Pericardium, extracardiac: A trivial pericardial effusion was   identified.  Impressions:  - Increased velocities are seen across all valves, consistent with   high cardiac output (consider anemia, sepsis, thyrotoxicosis,   etc.)   Antimicrobials: Anti-infectives (From admission, onward)   Start     Dose/Rate Route Frequency Ordered Stop   10/27/18 0600  ceFAZolin (ANCEF) IVPB 2g/100 mL premix     2 g 200 mL/hr over 30 Minutes Intravenous On call to O.R. 10/26/18 1245 10/26/18 1618   10/26/18 2030  ceFAZolin (ANCEF) IVPB 2g/100 mL premix     2 g 200 mL/hr over 30 Minutes Intravenous Every 6 hours 10/26/18 2027 10/27/18 1000   10/26/18 1654  vancomycin (VANCOCIN) powder  Status:  Discontinued       As needed 10/26/18 1655 10/26/18 1825     Subjective: Some pain in her arm. Overall better.  Objective: Vitals:   10/29/18 1415 10/29/18 1955 10/30/18 0558 10/30/18 0806  BP: (!) 118/52 102/69 (!) 97/53 101/82  Pulse: 79 80 75 73  Resp: 16   16  Temp: 98.6 F  (37 C) 98.7 F (37.1 C) 98.4 F (36.9 C) (!) 97.1 F (36.2 C)  TempSrc: Oral Oral Oral Oral  SpO2: 99% 94% 91% 94%  Weight:      Height:        Intake/Output Summary (Last 24 hours) at 10/30/2018 1323 Last data filed at 10/30/2018 0600 Gross per 24 hour  Intake 240 ml  Output 1650 ml  Net -1410 ml   Filed Weights   10/26/18 1302 10/27/18 1450  Weight: 112.9 kg 122.8 kg    Examination:  General: No acute distress. Cardiovascular: Heart sounds show a regular rate, and rhythm Lungs: Clear to auscultation bilaterally  Abdomen: Soft, nontender, nondistended  Neurological: Alert and oriented 3. Moves all extremities 4. Cranial nerves II through XII grossly intact. Skin: Warm and dry. No rashes or lesions. Extremities: R arm in sling Psychiatric: Mood and affect are normal. Insight and judgment are appropriate.   Data Reviewed: I have personally reviewed following labs and imaging studies  CBC: Recent Labs  Lab 10/27/18 0228 10/27/18 1129 10/28/18 0921 10/29/18 0431 10/30/18 0311  WBC 10.0 12.1* 14.7* 11.7* 10.7*  HGB 9.4* 9.4* 9.2* 8.9* 8.6*  HCT 29.9* 30.5* 28.9* 28.3* 26.2*  MCV  99.3 100.0 99.3 97.3 98.1  PLT 288 313 349 347 683   Basic Metabolic Panel: Recent Labs  Lab 10/27/18 0228 10/27/18 1129 10/28/18 0921 10/29/18 0431 10/30/18 0311  NA 136 136 136 136 135  K 5.2* 4.8 4.9 5.1 5.0  CL 104 102 99 99 96*  CO2 26 25 29 31 29   GLUCOSE 223* 163* 166* 113* 110*  BUN 19 22 33* 29* 27*  CREATININE 1.02* 1.14* 0.85 0.92 1.03*  CALCIUM 8.2* 8.7* 9.9 10.4* 9.4  MG  --  2.0 1.8 1.8  --    GFR: Estimated Creatinine Clearance: 51.7 mL/min (A) (by C-G formula based on SCr of 1.03 mg/dL (H)). Liver Function Tests: Recent Labs  Lab 10/27/18 1129 10/28/18 0921 10/29/18 0431 10/30/18 0819  AST 63* 36 28  --   ALT 56* 29 22  --   ALKPHOS 171* 149* 133*  --   BILITOT 0.7 0.9 0.7  --   PROT 5.2* 5.1* 5.1*  --   ALBUMIN 2.5* 2.3* 2.1* 2.3*   No results for  input(s): LIPASE, AMYLASE in the last 168 hours. No results for input(s): AMMONIA in the last 168 hours. Coagulation Profile: No results for input(s): INR, PROTIME in the last 168 hours. Cardiac Enzymes: No results for input(s): CKTOTAL, CKMB, CKMBINDEX, TROPONINI in the last 168 hours. BNP (last 3 results) No results for input(s): PROBNP in the last 8760 hours. HbA1C: No results for input(s): HGBA1C in the last 72 hours. CBG: Recent Labs  Lab 10/26/18 1310 10/26/18 1834  GLUCAP 93 121*   Lipid Profile: No results for input(s): CHOL, HDL, LDLCALC, TRIG, CHOLHDL, LDLDIRECT in the last 72 hours. Thyroid Function Tests: Recent Labs    10/29/18 1937  TSH 0.785   Anemia Panel: No results for input(s): VITAMINB12, FOLATE, FERRITIN, TIBC, IRON, RETICCTPCT in the last 72 hours. Sepsis Labs: No results for input(s): PROCALCITON, LATICACIDVEN in the last 168 hours.  No results found for this or any previous visit (from the past 240 hour(s)).       Radiology Studies: No results found.      Scheduled Meds: . allopurinol  300 mg Oral Q breakfast  . citalopram  20 mg Oral Daily  . docusate sodium  100 mg Oral BID  . enoxaparin (LOVENOX) injection  30 mg Subcutaneous Q24H  . fesoterodine  4 mg Oral Daily  . furosemide  20 mg Oral Daily  . hydrOXYzine  25 mg Oral QHS  . levothyroxine  125 mcg Oral Q0600  . lisinopril  20 mg Oral Q breakfast  . loratadine  10 mg Oral Daily  . Melatonin  3 mg Oral QHS  . naproxen  250 mg Oral BID WC  . pantoprazole  40 mg Oral Daily  . polyethylene glycol  17 g Oral BID  . pyridOXINE  100 mg Oral Daily  . senna  2 tablet Oral QHS  . traMADol  50 mg Oral Q6H  . vitamin B-12  100 mcg Oral Daily  . vitamin C  500 mg Oral Daily   Continuous Infusions:   LOS: 4 days    Time spent: over 30 min    Fayrene Helper, MD [facility] Pager [email or web address]  If 7PM-7AM, please contact night-coverage [email or web address] Password [email or web address] 10/30/2018,  1:23 PM

## 2018-10-31 DIAGNOSIS — Z6841 Body Mass Index (BMI) 40.0 and over, adult: Secondary | ICD-10-CM | POA: Diagnosis not present

## 2018-10-31 DIAGNOSIS — M6281 Muscle weakness (generalized): Secondary | ICD-10-CM | POA: Diagnosis not present

## 2018-10-31 DIAGNOSIS — D649 Anemia, unspecified: Secondary | ICD-10-CM | POA: Diagnosis not present

## 2018-10-31 DIAGNOSIS — E119 Type 2 diabetes mellitus without complications: Secondary | ICD-10-CM | POA: Diagnosis present

## 2018-10-31 DIAGNOSIS — M2550 Pain in unspecified joint: Secondary | ICD-10-CM | POA: Diagnosis not present

## 2018-10-31 DIAGNOSIS — J309 Allergic rhinitis, unspecified: Secondary | ICD-10-CM | POA: Diagnosis not present

## 2018-10-31 DIAGNOSIS — Z8672 Personal history of thrombophlebitis: Secondary | ICD-10-CM | POA: Diagnosis not present

## 2018-10-31 DIAGNOSIS — R2681 Unsteadiness on feet: Secondary | ICD-10-CM | POA: Diagnosis not present

## 2018-10-31 DIAGNOSIS — K449 Diaphragmatic hernia without obstruction or gangrene: Secondary | ICD-10-CM | POA: Diagnosis present

## 2018-10-31 DIAGNOSIS — R197 Diarrhea, unspecified: Secondary | ICD-10-CM | POA: Diagnosis not present

## 2018-10-31 DIAGNOSIS — S42351D Displaced comminuted fracture of shaft of humerus, right arm, subsequent encounter for fracture with routine healing: Secondary | ICD-10-CM | POA: Diagnosis not present

## 2018-10-31 DIAGNOSIS — E539 Vitamin B deficiency, unspecified: Secondary | ICD-10-CM | POA: Diagnosis not present

## 2018-10-31 DIAGNOSIS — F3289 Other specified depressive episodes: Secondary | ICD-10-CM | POA: Diagnosis not present

## 2018-10-31 DIAGNOSIS — Z9221 Personal history of antineoplastic chemotherapy: Secondary | ICD-10-CM | POA: Diagnosis not present

## 2018-10-31 DIAGNOSIS — F329 Major depressive disorder, single episode, unspecified: Secondary | ICD-10-CM | POA: Diagnosis present

## 2018-10-31 DIAGNOSIS — Z8542 Personal history of malignant neoplasm of other parts of uterus: Secondary | ICD-10-CM | POA: Diagnosis not present

## 2018-10-31 DIAGNOSIS — I10 Essential (primary) hypertension: Secondary | ICD-10-CM | POA: Diagnosis present

## 2018-10-31 DIAGNOSIS — Z96653 Presence of artificial knee joint, bilateral: Secondary | ICD-10-CM | POA: Diagnosis present

## 2018-10-31 DIAGNOSIS — Z79899 Other long term (current) drug therapy: Secondary | ICD-10-CM | POA: Diagnosis not present

## 2018-10-31 DIAGNOSIS — Z96611 Presence of right artificial shoulder joint: Secondary | ICD-10-CM | POA: Diagnosis not present

## 2018-10-31 DIAGNOSIS — S42201D Unspecified fracture of upper end of right humerus, subsequent encounter for fracture with routine healing: Secondary | ICD-10-CM | POA: Diagnosis not present

## 2018-10-31 DIAGNOSIS — C541 Malignant neoplasm of endometrium: Secondary | ICD-10-CM | POA: Diagnosis not present

## 2018-10-31 DIAGNOSIS — M109 Gout, unspecified: Secondary | ICD-10-CM | POA: Diagnosis present

## 2018-10-31 DIAGNOSIS — R0902 Hypoxemia: Secondary | ICD-10-CM | POA: Diagnosis not present

## 2018-10-31 DIAGNOSIS — Z90710 Acquired absence of both cervix and uterus: Secondary | ICD-10-CM | POA: Diagnosis not present

## 2018-10-31 DIAGNOSIS — E039 Hypothyroidism, unspecified: Secondary | ICD-10-CM | POA: Diagnosis present

## 2018-10-31 DIAGNOSIS — Z86718 Personal history of other venous thrombosis and embolism: Secondary | ICD-10-CM | POA: Diagnosis not present

## 2018-10-31 DIAGNOSIS — Z9079 Acquired absence of other genital organ(s): Secondary | ICD-10-CM | POA: Diagnosis not present

## 2018-10-31 DIAGNOSIS — Z7401 Bed confinement status: Secondary | ICD-10-CM | POA: Diagnosis not present

## 2018-10-31 DIAGNOSIS — N179 Acute kidney failure, unspecified: Secondary | ICD-10-CM | POA: Diagnosis not present

## 2018-10-31 DIAGNOSIS — Z923 Personal history of irradiation: Secondary | ICD-10-CM | POA: Diagnosis not present

## 2018-10-31 DIAGNOSIS — Z471 Aftercare following joint replacement surgery: Secondary | ICD-10-CM | POA: Diagnosis not present

## 2018-10-31 DIAGNOSIS — E785 Hyperlipidemia, unspecified: Secondary | ICD-10-CM | POA: Diagnosis present

## 2018-10-31 DIAGNOSIS — Z9049 Acquired absence of other specified parts of digestive tract: Secondary | ICD-10-CM | POA: Diagnosis not present

## 2018-10-31 DIAGNOSIS — Z741 Need for assistance with personal care: Secondary | ICD-10-CM | POA: Diagnosis not present

## 2018-10-31 DIAGNOSIS — K219 Gastro-esophageal reflux disease without esophagitis: Secondary | ICD-10-CM | POA: Diagnosis present

## 2018-10-31 DIAGNOSIS — G8929 Other chronic pain: Secondary | ICD-10-CM | POA: Diagnosis present

## 2018-10-31 DIAGNOSIS — K5900 Constipation, unspecified: Secondary | ICD-10-CM | POA: Diagnosis not present

## 2018-10-31 DIAGNOSIS — R41841 Cognitive communication deficit: Secondary | ICD-10-CM | POA: Diagnosis not present

## 2018-10-31 DIAGNOSIS — I959 Hypotension, unspecified: Secondary | ICD-10-CM | POA: Diagnosis not present

## 2018-10-31 DIAGNOSIS — D62 Acute posthemorrhagic anemia: Secondary | ICD-10-CM | POA: Diagnosis present

## 2018-10-31 DIAGNOSIS — R58 Hemorrhage, not elsewhere classified: Secondary | ICD-10-CM | POA: Diagnosis not present

## 2018-10-31 DIAGNOSIS — K922 Gastrointestinal hemorrhage, unspecified: Secondary | ICD-10-CM | POA: Diagnosis not present

## 2018-10-31 DIAGNOSIS — K254 Chronic or unspecified gastric ulcer with hemorrhage: Secondary | ICD-10-CM | POA: Diagnosis present

## 2018-10-31 DIAGNOSIS — G4733 Obstructive sleep apnea (adult) (pediatric): Secondary | ICD-10-CM | POA: Diagnosis present

## 2018-10-31 DIAGNOSIS — R092 Respiratory arrest: Secondary | ICD-10-CM | POA: Diagnosis not present

## 2018-10-31 DIAGNOSIS — G4700 Insomnia, unspecified: Secondary | ICD-10-CM | POA: Diagnosis not present

## 2018-10-31 NOTE — Clinical Social Work Placement (Signed)
   CLINICAL SOCIAL WORK PLACEMENT  NOTE  Date:  10/31/2018  Patient Details  Name: Melinda Kerr MRN: [id number] Date of Birth: [birth year]  Clinical Social Work is seeking post-discharge placement for this patient at the [place] level of care (*CSW will initial, date and re-position this form in  chart as items are completed):      Patient/family provided with [facility] Work Department's list of facilities offering this level of care within the geographic area requested by the patient (or if unable, by the patient's family).  Yes   Patient/family informed of their freedom to choose among providers that offer the needed level of care, that participate in Medicare, Medicaid or managed care program needed by the patient, have an available bed and are willing to accept the patient.      Patient/family informed of [facility]'s ownership interest in [facility] and [facility], as well as of the fact that they are under no obligation to receive care at these facilities.  PASRR submitted to EDS on       PASRR number received on 10/27/18     Existing PASRR number confirmed on       FL2 transmitted to all facilities in geographic area requested by pt/family on 10/27/18     FL2 transmitted to all facilities within larger geographic area on       Patient informed that his/her managed care company has contracts with or will negotiate with certain facilities, including the following:        Yes   Patient/family informed of bed offers received.  Patient chooses bed at [facility]     Physician recommends and patient chooses bed at      Patient to be transferred to [facility] on 10/31/18.  Patient to be transferred to facility by [facility]     Patient family notified on 10/31/18 of transfer.  Name of family member notified:  [name]     PHYSICIAN       Additional Comment:    _______________________________________________ Alberteen Sam,  LCSW 10/31/2018, 3:17 PM

## 2018-10-31 NOTE — Progress Notes (Signed)
Physical Therapy Treatment Patient Details Name: Melinda Kerr MRN: [id number] DOB: Feb 05, 1935 Today's Date: 10/31/2018    History of Present Illness Pt is an 84 y.o. female who sustained R humeral fx in a fall. She underwent reverse TSA 10-26-18.     PT Comments    Patient seen for mobility progression. Pt tolerated gait training distance 50 ft X 2 trials with mod A and seated rest break. Pt with 2/4 DOE with ambulation which pt reports is baseline. Continue to progress as tolerated with anticipated d/c to SNF for further skilled PT services.     Follow Up Recommendations  SNF     Equipment Recommendations  Other (comment)(TBD at next venue)    Recommendations for Other Services       Precautions / Restrictions Precautions Precautions: Fall;Shoulder Type of Shoulder Precautions: passive protocol Shoulder Interventions: Shoulder sling/immobilizer;At all times;Off for dressing/bathing/exercises Required Braces or Orthoses: Sling Restrictions Weight Bearing Restrictions: Yes RUE Weight Bearing: Non weight bearing Other Position/Activity Restrictions: (Dr. Stann Mainland would like to maintain NWB but RW okay if needed )    Mobility  Bed Mobility Overal bed mobility: Needs Assistance Bed Mobility: Supine to Sit     Supine to sit: HOB elevated;Mod assist     General bed mobility comments: assist to bring hips to EOB and to elevate trunk into sitting   Transfers Overall transfer level: Needs assistance Equipment used: 1 person hand held assist;None Transfers: Sit to/from [facility] Sit to Stand: Min assist Stand pivot transfers: Mod assist       General transfer comment: assist to power up and to steady for pivot from bed to [facility] and then to stand from [facility]  Ambulation/Gait Ambulation/Gait assistance: Mod assist(chair follow) Gait Distance (Feet): (50 ft X 2 trials with seated rest break due to DOE) Assistive device: 1 person hand held assist Gait  Pattern/deviations: Wide base of support;Step-through pattern;Decreased stride length(lateral sway due to body habitus) Gait velocity: decreased   General Gait Details: assist for balance; cues for forward gaze; lateral sway; 2/4 DOE    Marine scientist [place] (Stroke Patients Only)       Balance Overall balance assessment: Needs assistance Sitting-balance support: No upper extremity supported;Feet supported Sitting balance-Leahy Scale: Good     Standing balance support: Single extremity supported;During functional activity Standing balance-Leahy Scale: Poor Standing balance comment: reliant on external support                            Cognition Arousal/Alertness: Awake/alert Behavior During Therapy: WFL for tasks assessed/performed Overall Cognitive Status: Within Functional Limits for tasks assessed                                        Exercises      General Comments        Pertinent Vitals/Pain Pain Assessment: Faces Faces Pain Scale: Hurts little more Pain Location: R shoulder while mobilizing Pain Descriptors / Indicators: Sore Pain Intervention(s): Limited activity within patient's tolerance;Monitored during session;Repositioned    Home Living                      Prior Function            PT Goals (current goals  can now be found in the care plan section) Acute Rehab PT Goals Patient Stated Goal: independence Progress towards PT goals: Progressing toward goals    Frequency    Min 3X/week      PT Plan Current plan remains appropriate    Co-evaluation              AM-PAC PT "6 Clicks" Mobility   Outcome Measure  Help needed turning from your back to your side while in a flat bed without using bedrails?: A Lot Help needed moving from lying on your back to sitting on the side of a flat bed without using bedrails?: A Lot Help needed moving to and from a bed  to a chair (including a wheelchair)?: A Little Help needed standing up from a chair using your arms (e.g., wheelchair or bedside chair)?: A Little Help needed to walk in hospital room?: A Little Help needed climbing 3-5 steps with a railing? : A Lot 6 Click Score: 15    End of Session Equipment Utilized During Treatment: Gait belt Activity Tolerance: Patient tolerated treatment well Patient left: in chair;with call bell/phone within reach Nurse Communication: Mobility status PT Visit Diagnosis: Other abnormalities of gait and mobility (R26.89);Difficulty in walking, not elsewhere classified (R26.2)     Time: 3567-0141 PT Time Calculation (min) (ACUTE ONLY): 39 min  Charges:  $Gait Training: 23-37 mins                     Earney Navy, PTA Acute Rehabilitation Services Pager: [phone] Office: [phone]     Darliss Cheney 10/31/2018, 1:48 PM

## 2018-10-31 NOTE — Progress Notes (Signed)
Patient will DC GE:[id number] Anticipated DC date:10/31/2018 Family notified:[name] Transport TM:[id number]  Per MD patient ready for DC to [facility]. RN, patient, patient's family, and facility notified of DC. Discharge Summary sent to facility. RN given number for report [phone]. DC packet on chart. Ambulance transport requested for patient.  CSW signing off.  [place], [place]

## 2018-10-31 NOTE — Progress Notes (Signed)
   10/31/18 1100  Clinical Encounter Type  Visited With Patient  Visit Type Spiritual support  Responded to [facility] consult for emotional support for recent loss of best friend. Patient was alert and sitting up in bed. Talk with patient about her friend and how much she will miss her. Patient was a pleasure to talk with. Will revisit at patient request.

## 2018-10-31 NOTE — Progress Notes (Addendum)
Occupational Therapy Treatment Patient Details Name: Melinda Kerr MRN: [id number] DOB: [birth year] Today's Date: 10/31/2018    History of present illness Pt is an 84 y.o. female who sustained R humeral fx in a fall. She underwent reverse TSA 10-26-18.    OT comments  Pt progressing toward OT goals. Pt received in chair and ready to return to bed. Pt required 1 hand held assist, Mod A to power up to stand from chair to bed. While seated at EOB pt engaged in Shoulder PROM exercises with demonstration and verbal commands. Pt required Min A to Mod A from sit to supine, with assistance to position legs in bed and upper trunk to lie down. Pt is set for DC to SNF to maximize independence to transition to home setting and to continue receiving skilled OT services to progress towards independence. OT will sign off and follow plan accordingly.   Follow Up Recommendations  SNF;Supervision/Assistance - 24 hour    Equipment Recommendations  Other (comment)(defer to next venue of care)    Recommendations for Other Services      Precautions / Restrictions Precautions Precautions: Fall;Shoulder Type of Shoulder Precautions: passive protocol Shoulder Interventions: Shoulder sling/immobilizer;At all times;Off for dressing/bathing/exercises Precaution Booklet Issued: Yes (comment) Precaution Comments: reviewed shoulder dc handout in full and reviewed HEP (included PROM this session) Required Braces or Orthoses: Sling Restrictions Weight Bearing Restrictions: Yes RUE Weight Bearing: Non weight bearing Other Position/Activity Restrictions: (Dr. Stann Mainland would like to maintain NWB but RW okay if needed )       Mobility Bed Mobility Overal bed mobility: Needs Assistance Bed Mobility: Sit to Supine     Supine to sit: HOB elevated;Mod assist Sit to supine: Mod assist   General bed mobility comments: assist to bring legs to up in bed to supine position. Pt also required min to mod A of upper trunk  support to come to [facility].   Transfers Overall transfer level: Needs assistance Equipment used: 1 person hand held assist;None Transfers: Sit to/from [facility] Sit to Stand: Mod assist Stand pivot transfers: Mod assist       General transfer comment: assist to power up with 1 hand assist and to steady for transfer from chair to bed.    Balance Overall balance assessment: Needs assistance Sitting-balance support: No upper extremity supported;Feet supported Sitting balance-Leahy Scale: Good     Standing balance support: Single extremity supported;During functional activity Standing balance-Leahy Scale: Poor Standing balance comment: reliant on external support                           ADL either performed or assessed with clinical judgement   ADL Overall ADL's : Needs assistance/impaired                         Toilet Transfer: Minimal assistance;Stand-pivot Toilet Transfer Details (indicate cue type and reason): 1 person HHA, simulated during transfer from eob to recliner  (pt. was on bedpan upon arrival, total assist for clean up rolling R and bedpan removal)         Functional mobility during ADLs: Moderate assistance General ADL Comments: Pt educated and performed UE shoulder exercises to improve ROM of RUE.      Vision       Perception     Praxis      Cognition Arousal/Alertness: Awake/alert Behavior During Therapy: WFL for tasks assessed/performed Overall Cognitive Status: Within Functional Limits  for tasks assessed                                 General Comments: Pt anxious due to difficulty breathing. Pt on 4 L O2 with SpO2 96%.         Exercises Exercises: Shoulder Shoulder Exercises Pendulum Exercise: Right(educated gentle pendulums, unable to demonstrate today) Shoulder Flexion: PROM;Right;Supine;10 reps Shoulder ABduction: PROM;Right;Seated;10 reps(educated to 60, requires external assist due to  body habitus) Shoulder External Rotation: PROM;Right;Seated;10 reps(educated to 30, requires external assist due to body habitus) Elbow Flexion: AROM;Right;10 reps;Seated Elbow Extension: AROM;10 reps;Right Wrist Flexion: AROM;Right;10 reps;Seated Wrist Extension: AROM;Right;10 reps;Seated Digit Composite Flexion: AROM;Right;10 reps;Seated Composite Extension: AROM;Right;10 reps;Seated   Shoulder Instructions Shoulder Instructions Donning/doffing shirt without moving shoulder: Maximal assistance Method for sponge bathing under operated UE: Maximal assistance Correct positioning of sling/immobilizer: Maximal assistance ROM for elbow, wrist and digits of operated UE: Supervision/safety Sling wearing schedule (on at all times/off for ADL's): Independent Positioning of UE while sleeping: Maximal assistance     General Comments      Pertinent Vitals/ Pain       Pain Assessment: No/denies pain Pain Score: 1  Faces Pain Scale: Hurts little more Pain Location: R shoulder while mobilizing Pain Descriptors / Indicators: Sore Pain Intervention(s): Monitored during session;Repositioned  Home Living                                          Prior Functioning/Environment              Frequency  Min 3X/week        Progress Toward Goals  OT Goals(current goals can now be found in the care plan section)  Progress towards OT goals: Progressing toward goals  Acute Rehab OT Goals Patient Stated Goal: independence OT Goal Formulation: With patient Time For Goal Achievement: 11/10/18 Potential to Achieve Goals: Fair ADL Goals Pt Will Perform Eating: sitting;with modified independence Pt Will Perform Grooming: with modified independence;with adaptive equipment;sitting Pt Will Perform Upper Body Bathing: with modified independence;sitting Pt Will Perform Upper Body Dressing: with modified independence;sitting Pt/caregiver will Perform Home Exercise Program:  Increased strength;Increased ROM;Right Upper extremity;With Supervision;With written HEP provided  Plan Discharge plan remains appropriate;Frequency remains appropriate    Co-evaluation    PT/OT/SLP Co-Evaluation/Treatment: Yes            AM-PAC OT "6 Clicks" Daily Activity     Outcome Measure   Help from another person eating meals?: A Little Help from another person taking care of personal grooming?: A Little Help from another person toileting, which includes using toliet, bedpan, or urinal?: A Lot Help from another person bathing (including washing, rinsing, drying)?: A Lot Help from another person to put on and taking off regular upper body clothing?: A Lot Help from another person to put on and taking off regular lower body clothing?: A Lot 6 Click Score: 14    End of Session Equipment Utilized During Treatment: Gait belt  OT Visit Diagnosis: Pain Pain - Right/Left: Right Pain - part of body: Shoulder   Activity Tolerance Patient tolerated treatment well   Patient Left with call bell/phone within reach;in bed   Nurse Communication Other (comment)(notified that pt. needed purewik placement)        Time: 0277-4128 OT Time Calculation (min): 28 min  Charges: OT General Charges $OT Visit: 1 Visit OT Treatments $Self Care/Home Management : 8-22 mins $Therapeutic Exercise: 8-22 mins  Minus Breeding, MSOT, OTR/L  Supplemental Rehabilitation Services  [phone]   Marius Ditch 10/31/2018, 3:35 PM

## 2018-10-31 NOTE — Discharge Summary (Signed)
Patient ID: Melinda Kerr MRN: [id number] DOB/AGE: 82-Aug-1936 82 y.o.  Admit date: 10/26/2018 Discharge date:   Primary Diagnosis: Right proximal humerus comminuted fracture  Admission Diagnoses:  Past Medical History:  Diagnosis Date  . Arthritis    "shoulders, knees, lower back" (07/14/2017)  . CHF (congestive heart failure) ([place])    "while in hospital w/hysterectomy in 2017"  . Chronic lower back pain   . Depression   . Diverticulitis of large intestine with abscess 07/13/2017  . DVT (deep venous thrombosis) ([place])   . Dyspnea    "cause I'm over weight"  . Esophageal reflux   . Gout   . Hyperlipidemia    under control  . Hypertension   . Hypothyroidism   . Morbid obesity with BMI of 40.0-44.9, adult ([place])   . Nocturia   . OSA on CPAP    "not wearing it when I sleep in my lift chair" (07/14/2017)  . Osteoarthritis   . Phlebitis    hx of  . Pneumonia ?1989  . Spondylosis    with scoliosis  . Type II diabetes mellitus (HCC)    borderline , diet controlled   . Urinary frequency   . Uterine cancer (HCC)    S/P chemo, radiation, hysterectomy   Discharge Diagnoses:   Principal Problem:   S/P reverse total shoulder arthroplasty, right Active Problems:   OSA on CPAP   Morbid obesity with BMI of 45.0-49.9, adult (HCC)   Essential hypertension   Dyspnea on exertion  Estimated body mass index is 52.87 kg/m as calculated from the following:   Height as of this encounter: 5' (1.524 m).   Weight as of this encounter: 122.8 kg.  Procedure:  Procedure(s) (LRB): REVERSE SHOULDER ARTHROPLASTY (Right)   Consults: internal medicine  HPI: [facility] level fall.  She sustained a right comminuted proximal humerus fracture that was indicated for arthroplasty en the nature of the fracture and comminution. Laboratory Data: Admission on 10/26/2018  Component Date Value Ref Range Status  . Sodium  10/26/2018 138  135 - 145 mmol/L Final  . Potassium 10/26/2018 4.4  3.5 - 5.1 mmol/L Final  . Chloride 10/26/2018 102  98 - 111 mmol/L Final  . CO2 10/26/2018 27  22 - 32 mmol/L Final  . Glucose, Bld 10/26/2018 121* 70 - 99 mg/dL Final  . BUN 10/26/2018 19  8 - 23 mg/dL Final  . Creatinine, Ser 10/26/2018 0.80  0.44 - 1.00 mg/dL Final  . Calcium 10/26/2018 8.7* 8.9 - 10.3 mg/dL Final  . GFR calc non Af Amer 10/26/2018 >60  >60 mL/min Final  . GFR calc Af Amer 10/26/2018 >60  >60 mL/min Final  . Anion gap 10/26/2018 9  5 - 15 Final   Performed at [facility], [place] [address], [place], [place] [address]  . WBC 10/26/2018 7.6  4.0 - 10.5 K/uL Final  . RBC 10/26/2018 3.45* 3.87 - 5.11 MIL/uL Final  . Hemoglobin 10/26/2018 10.7* 12.0 - 15.0 g/dL Final  . HCT 10/26/2018 34.4* 36.0 - 46.0 % Final  . MCV 10/26/2018 99.7  80.0 - 100.0 fL Final  . MCH 10/26/2018 31.0  26.0 - 34.0 pg Final  . MCHC 10/26/2018 31.1  30.0 - 36.0 g/dL Final  . RDW 10/26/2018 14.2  11.5 - 15.5 % Final  . Platelets 10/26/2018 305  150 - 400 K/uL Final  . nRBC 10/26/2018 0.0  0.0 -  0.2 % Final   Performed at [facility], [place] [address] [place], [place] [address]  . ABO/RH(D) 10/26/2018 A NEG   Final  . Antibody Screen 10/26/2018 NEG   Final  . Sample Expiration 10/26/2018 10/29/2018   Final  . Cristela Blue D 10/26/2018    Final                   Value:POS Performed at [facility], [place] [address], [place], [place] [address]   . Hgb A1c MFr Bld 10/26/2018 6.2* 4.8 - 5.6 % Final   Comment: (NOTE) Pre diabetes:          5.7%-6.4% Diabetes:              >6.4% Glycemic control for   <7.0% adults with diabetes   . Mean Plasma Glucose 10/26/2018 131.24  mg/dL Final   Performed at [place] [address] [place], [place] [address]  . Glucose-Capillary 10/26/2018 93  70 - 99 mg/dL Final  . ABO/RH(D) 10/26/2018    Final                   Value:A NEG Performed at [facility], [place]  [address], [place], [place] [address]   . Glucose-Capillary 10/26/2018 121* 70 - 99 mg/dL Final  . WBC 10/27/2018 10.0  4.0 - 10.5 K/uL Final  . RBC 10/27/2018 3.01* 3.87 - 5.11 MIL/uL Final  . Hemoglobin 10/27/2018 9.4* 12.0 - 15.0 g/dL Final  . HCT 10/27/2018 29.9* 36.0 - 46.0 % Final  . MCV 10/27/2018 99.3  80.0 - 100.0 fL Final  . MCH 10/27/2018 31.2  26.0 - 34.0 pg Final  . MCHC 10/27/2018 31.4  30.0 - 36.0 g/dL Final  . RDW 10/27/2018 14.1  11.5 - 15.5 % Final  . Platelets 10/27/2018 288  150 - 400 K/uL Final  . nRBC 10/27/2018 0.0  0.0 - 0.2 % Final   Performed at [facility], [place] [address] [place], [place] [address]  . Sodium 10/27/2018 136  135 - 145 mmol/L Final  . Potassium 10/27/2018 5.2* 3.5 - 5.1 mmol/L Final  . Chloride 10/27/2018 104  98 - 111 mmol/L Final  . CO2 10/27/2018 26  22 - 32 mmol/L Final  . Glucose, Bld 10/27/2018 223* 70 - 99 mg/dL Final  . BUN 10/27/2018 19  8 - 23 mg/dL Final  . Creatinine, Ser 10/27/2018 1.02* 0.44 - 1.00 mg/dL Final  . Calcium 10/27/2018 8.2* 8.9 - 10.3 mg/dL Final  . GFR calc non Af Amer 10/27/2018 52* >60 mL/min Final  . GFR calc Af Amer 10/27/2018 60* >60 mL/min Final  . Anion gap 10/27/2018 6  5 - 15 Final   Performed at [facility], [place] [address] [place],  [address]  . WBC 10/27/2018 12.1* 4.0 - 10.5 K/uL Final  . RBC 10/27/2018 3.05* 3.87 - 5.11 MIL/uL Final  . Hemoglobin 10/27/2018 9.4* 12.0 - 15.0 g/dL Final  . HCT 10/27/2018 30.5* 36.0 - 46.0 % Final  . MCV 10/27/2018 100.0  80.0 - 100.0 fL Final  . MCH 10/27/2018 30.8  26.0 - 34.0 pg Final  . MCHC 10/27/2018 30.8  30.0 - 36.0 g/dL Final  . RDW 10/27/2018 14.2  11.5 - 15.5 % Final  . Platelets 10/27/2018 313  150 - 400 K/uL Final  . nRBC 10/27/2018 0.0  0.0 - 0.2 % Final   Performed at [facility], [address], [place], [place] [address]  . Sodium 10/27/2018 136  135 - 145 mmol/L Final  . Potassium 10/27/2018 4.8  3.5 - 5.1 mmol/L Final  .  Chloride 10/27/2018 102  98 - 111 mmol/L Final  . CO2 10/27/2018 25  22 - 32 mmol/L Final  . Glucose, Bld 10/27/2018 163* 70 - 99 mg/dL Final  . BUN 10/27/2018 22  8 - 23 mg/dL Final  . Creatinine, Ser 10/27/2018 1.14* 0.44 - 1.00 mg/dL Final  . Calcium 10/27/2018 8.7* 8.9 - 10.3 mg/dL Final  . Total Protein 10/27/2018 5.2* 6.5 - 8.1 g/dL Final  . Albumin 10/27/2018 2.5* 3.5 - 5.0 g/dL Final  . AST 10/27/2018 63* 15 - 41 U/L Final  . ALT 10/27/2018 56* 0 - 44 U/L Final  . Alkaline Phosphatase 10/27/2018 171* 38 - 126 U/L Final  . Total Bilirubin 10/27/2018 0.7  0.3 - 1.2 mg/dL Final  . GFR calc non Af Amer 10/27/2018 45* >60 mL/min Final  . GFR calc Af Amer 10/27/2018 52* >60 mL/min Final  . Anion gap 10/27/2018 9  5 - 15 Final   Performed at [facility], [place] [address], [place], [place] [address]  . B Natriuretic Peptide 10/27/2018 331.3* 0.0 - 100.0 pg/mL Final   Performed at [facility], [place] [address] [place], [place] [address]  . Magnesium 10/27/2018 2.0  1.7 - 2.4 mg/dL Final   Performed at [place] [address] [place], [place] [address]  . WBC 10/28/2018 14.7* 4.0 - 10.5 K/uL Final  . RBC 10/28/2018 2.91* 3.87 - 5.11 MIL/uL Final  . Hemoglobin 10/28/2018 9.2* 12.0 - 15.0 g/dL Final  . HCT 10/28/2018 28.9* 36.0 - 46.0 % Final  . MCV 10/28/2018 99.3  80.0 - 100.0 fL Final  . MCH 10/28/2018 31.6  26.0 - 34.0 pg Final  . MCHC 10/28/2018 31.8  30.0 - 36.0 g/dL Final  . RDW 10/28/2018 14.2  11.5 - 15.5 % Final  . Platelets 10/28/2018 349  150 - 400 K/uL Final  . nRBC 10/28/2018 0.0  0.0 - 0.2 % Final   Performed at [facility], [place] [address] [place], [place] [address]  . Sodium 10/28/2018 136  135 - 145 mmol/L Final  . Potassium 10/28/2018 4.9  3.5 - 5.1 mmol/L Final  . Chloride 10/28/2018 99  98 - 111 mmol/L Final  . CO2 10/28/2018 29  22 - 32 mmol/L Final  . Glucose, Bld 10/28/2018 166* 70 - 99 mg/dL Final  . BUN 10/28/2018 33* 8 - 23  mg/dL Final  . Creatinine, Ser 10/28/2018 0.85  0.44 - 1.00 mg/dL Final  . Calcium 10/28/2018 9.9  8.9 - 10.3 mg/dL Final  . Total Protein 10/28/2018 5.1* 6.5 - 8.1 g/dL Final  . Albumin 10/28/2018 2.3* 3.5 - 5.0 g/dL Final  . AST 10/28/2018 36  15 - 41 U/L Final  . ALT 10/28/2018 29  0 - 44 U/L Final  . Alkaline Phosphatase 10/28/2018 149* 38 - 126 U/L Final  . Total Bilirubin 10/28/2018 0.9  0.3 - 1.2 mg/dL Final  . GFR calc non Af Amer 10/28/2018 >60  >60 mL/min Final  . GFR calc Af Amer 10/28/2018 >60  >60 mL/min Final  . Anion gap 10/28/2018 8  5 - 15 Final   Performed at [facility], [place] [address] [place], [place] [address]  . Magnesium 10/28/2018 1.8  1.7 - 2.4 mg/dL Final  Performed at [facility], [place] [address] [place], [place] [address]  . Weight 10/28/2018 4,331.6  oz Final  . Height 10/28/2018 60  in Final  . BP 10/28/2018 128/56  mmHg Final  . Fecal Occult Bld 10/28/2018 NEGATIVE  NEGATIVE Final   Performed at [facility], [place] [address], [place], [place] [address]  . WBC 10/29/2018 11.7* 4.0 - 10.5 K/uL Final  . RBC 10/29/2018 2.91* 3.87 - 5.11 MIL/uL Final  . Hemoglobin 10/29/2018 8.9* 12.0 - 15.0 g/dL Final  . HCT 10/29/2018 28.3* 36.0 - 46.0 % Final  . MCV 10/29/2018 97.3  80.0 - 100.0 fL Final  . MCH 10/29/2018 30.6  26.0 - 34.0 pg Final  . MCHC 10/29/2018 31.4  30.0 - 36.0 g/dL Final  . RDW 10/29/2018 14.2  11.5 - 15.5 % Final  . Platelets 10/29/2018 347  150 - 400 K/uL Final  . nRBC 10/29/2018 0.0  0.0 - 0.2 % Final   Performed at [facility], [place] [address], [place], [place] [address]  . Sodium 10/29/2018 136  135 - 145 mmol/L Final  . Potassium 10/29/2018 5.1  3.5 - 5.1 mmol/L Final  . Chloride 10/29/2018 99  98 - 111 mmol/L Final  . CO2 10/29/2018 31  22 - 32 mmol/L Final  . Glucose, Bld 10/29/2018 113* 70 - 99 mg/dL Final  . BUN 10/29/2018 29* 8 - 23 mg/dL Final  . Creatinine, Ser 10/29/2018 0.92  0.44 - 1.00 mg/dL Final    . Calcium 10/29/2018 10.4* 8.9 - 10.3 mg/dL Final  . Total Protein 10/29/2018 5.1* 6.5 - 8.1 g/dL Final  . Albumin 10/29/2018 2.1* 3.5 - 5.0 g/dL Final  . AST 10/29/2018 28  15 - 41 U/L Final  . ALT 10/29/2018 22  0 - 44 U/L Final  . Alkaline Phosphatase 10/29/2018 133* 38 - 126 U/L Final  . Total Bilirubin 10/29/2018 0.7  0.3 - 1.2 mg/dL Final  . GFR calc non Af Amer 10/29/2018 58* >60 mL/min Final  . GFR calc Af Amer 10/29/2018 >60  >60 mL/min Final  . Anion gap 10/29/2018 6  5 - 15 Final   Performed at [facility], [place] [address], [place], [place] [address]  . Magnesium 10/29/2018 1.8  1.7 - 2.4 mg/dL Final   Performed at [facility], [place] [address], [place], [place] [address]  . PTH 10/29/2018 10* 15 - 65 pg/mL Final  . Calcium, Total (PTH) 10/29/2018 10.1  8.7 - 10.3 mg/dL Final  . PTH Interp 10/29/2018 Comment   Final   Comment: (NOTE) Interpretation                 Intact PTH    Calcium                                (pg/mL)      (mg/dL) Normal                          15 - 65     8.6 - 10.2 Primary Hyperparathyroidism         >65          >10.2 Secondary Hyperparathyroidism       >65          <10.2 Non-Parathyroid Hypercalcemia       <65          >  10.2 Hypoparathyroidism                  <15          < 8.6 Non-Parathyroid Hypocalcemia    15 - 65          < 8.6 Performed At: [facility] [place], [place] [id number] Rush Farmer MD OI:[id number]   . TSH 10/29/2018 0.785  0.350 - 4.500 uIU/mL Final   Comment: Performed by a 3rd Generation assay with a functional sensitivity of <=0.01 uIU/mL. Performed at [facility], [place] [address] [place], [place] [address]   . WBC 10/30/2018 10.7* 4.0 - 10.5 K/uL Final  . RBC 10/30/2018 2.67* 3.87 - 5.11 MIL/uL Final  . Hemoglobin 10/30/2018 8.6* 12.0 - 15.0 g/dL Final  . HCT 10/30/2018 26.2* 36.0 - 46.0 % Final  . MCV 10/30/2018 98.1  80.0 - 100.0 fL Final  . MCH 10/30/2018 32.2  26.0 -  34.0 pg Final  . MCHC 10/30/2018 32.8  30.0 - 36.0 g/dL Final  . RDW 10/30/2018 14.5  11.5 - 15.5 % Final  . Platelets 10/30/2018 354  150 - 400 K/uL Final  . nRBC 10/30/2018 0.3* 0.0 - 0.2 % Final   Performed at [facility], [place] [address] [place], [place] [address]  . Sodium 10/30/2018 135  135 - 145 mmol/L Final  . Potassium 10/30/2018 5.0  3.5 - 5.1 mmol/L Final  . Chloride 10/30/2018 96* 98 - 111 mmol/L Final  . CO2 10/30/2018 29  22 - 32 mmol/L Final  . Glucose, Bld 10/30/2018 110* 70 - 99 mg/dL Final  . BUN 10/30/2018 27* 8 - 23 mg/dL Final  . Creatinine, Ser 10/30/2018 1.03* 0.44 - 1.00 mg/dL Final  . Calcium 10/30/2018 9.4  8.9 - 10.3 mg/dL Final  . GFR calc non Af Amer 10/30/2018 51* >60 mL/min Final  . GFR calc Af Amer 10/30/2018 59* >60 mL/min Final  . Anion gap 10/30/2018 10  5 - 15 Final   Performed at [facility], [place] [address] [place], [place] [address]  . Albumin 10/30/2018 2.3* 3.5 - 5.0 g/dL Final   Performed at [facility], [place] [address], [place], [place] [address]     X-Rays:Dg Shoulder Right  Result Date: 10/19/2018 CLINICAL DATA:  84 year old female with fall and right shoulder pain. EXAM: RIGHT SHOULDER - 2+ VIEW COMPARISON:  None FINDINGS: Evaluation is limited due to patient's body habitus and soft tissue attenuation. There is a displaced comminuted appearing fracture of the right humeral head with a displaced fracture of the greater tuberosity. There is anterior dislocation of the shoulder. There is an area of airspace opacity in the visualized right lower lung field which may represent a developing infiltrate or a mass. Further evaluation with dedicated chest radiograph or CT is recommended. This may also represent an area of atelectasis due to mass effect caused by hiatal hernia seen on the CT of 10/21/2017. IMPRESSION: 1. Displaced comminuted fracture of the right humeral head with displaced fracture of the greater tuberosity. There is  anterior dislocation of the right shoulder. 2. Focal area of airspace opacity in the visualized right lower lung field. Further evaluation with dedicated chest radiograph or CT is recommended. Electronically Signed   By: Anner Crete M.D.   On: 10/19/2018 00:08   Ct Head Wo Contrast  Result Date: 10/19/2018 CLINICAL DATA:  84 year old female with fall. EXAM: CT HEAD WITHOUT CONTRAST CT  CERVICAL SPINE WITHOUT CONTRAST TECHNIQUE: Multidetector CT imaging of the head and cervical spine was performed following the standard protocol without intravenous contrast. Multiplanar CT image reconstructions of the cervical spine were also generated. COMPARISON:  None. FINDINGS: CT HEAD FINDINGS Brain: There is mild age-related atrophy and chronic microvascular ischemic changes. There is no acute intracranial hemorrhage. No mass effect or midline shift. No extra-axial fluid collection. Vascular: No hyperdense vessel or unexpected calcification. Skull: Normal. Negative for fracture or focal lesion. Sinuses/Orbits: No acute finding. Other: None CT CERVICAL SPINE FINDINGS Alignment: No acute subluxation. Skull base and vertebrae: No acute fracture. Osteopenia. Soft tissues and spinal canal: No prevertebral fluid or swelling. No visible canal hematoma. Disc levels:  Multilevel degenerative changes. Upper chest: Mild emphysema. Atherosclerotic calcification of the aortic arch. Other: Bilateral carotid bulb calcified plaques. IMPRESSION: 1. No acute intracranial hemorrhage. 2. No acute/traumatic cervical spine pathology. Electronically Signed   By: Anner Crete M.D.   On: 10/19/2018 00:38   Ct Angio Chest Pe W Or Wo Contrast  Result Date: 10/27/2018 CLINICAL DATA:  84 year old female with acute shortness of breath. Recent shoulder replacement. EXAM: CT ANGIOGRAPHY CHEST WITH CONTRAST TECHNIQUE: Multidetector CT imaging of the chest was performed using the standard protocol during bolus administration of intravenous  contrast. Multiplanar CT image reconstructions and MIPs were obtained to evaluate the vascular anatomy. CONTRAST:  135mL ISOVUE-370 IOPAMIDOL (ISOVUE-370) INJECTION 76% COMPARISON:  10/26/2026 chest radiograph, 03/24/2016 chest CT and other studies. FINDINGS: Cardiovascular: This is a technically borderline study for the evaluation of pulmonary emboli due to streak and respiratory motion artifact. No definite pulmonary emboli are identified. Cardiomegaly again identified. Coronary artery and aortic atherosclerotic calcifications are present. No evidence of thoracic aortic aneurysm or pericardial effusion. Mediastinum/Nodes: A large hiatal hernia is again noted with distended stomach and esophagus. No enlarged lymph nodes or mediastinal mass noted. Lungs/Pleura: Increasing RIGHT LOWER lobe atelectasis noted in part due to very large hiatal hernia which contains a distended stomach. Moderate RIGHT UPPER lobe atelectasis is identified. Mild LEFT basilar atelectasis is present. No definite airspace disease or consolidation noted. No pleural effusion or pneumothorax. Upper Abdomen: No acute abnormality Musculoskeletal: RIGHT shoulder arthroplasty changes identified. No acute bony abnormalities are otherwise noted. Review of the MIP images confirms the above findings. IMPRESSION: 1. No definite evidence of pulmonary emboli. 2. Moderate RIGHT UPPER lobe atelectasis and increasing RIGHT LOWER lobe atelectasis. Increasing RIGHT LOWER lobe atelectasis is in part due to very large hiatal hernia which now contains a distended stomach. 3. Cardiomegaly 4. Coronary artery and Aortic Atherosclerosis (ICD10-I70.0). Electronically Signed   By: Margarette Canada M.D.   On: 10/27/2018 17:59   Ct Cervical Spine Wo Contrast  Result Date: 10/19/2018 CLINICAL DATA:  84 year old female with fall. EXAM: CT HEAD WITHOUT CONTRAST CT CERVICAL SPINE WITHOUT CONTRAST TECHNIQUE: Multidetector CT imaging of the head and cervical spine was performed  following the standard protocol without intravenous contrast. Multiplanar CT image reconstructions of the cervical spine were also generated. COMPARISON:  None. FINDINGS: CT HEAD FINDINGS Brain: There is mild age-related atrophy and chronic microvascular ischemic changes. There is no acute intracranial hemorrhage. No mass effect or midline shift. No extra-axial fluid collection. Vascular: No hyperdense vessel or unexpected calcification. Skull: Normal. Negative for fracture or focal lesion. Sinuses/Orbits: No acute finding. Other: None CT CERVICAL SPINE FINDINGS Alignment: No acute subluxation. Skull base and vertebrae: No acute fracture. Osteopenia. Soft tissues and spinal canal: No prevertebral fluid or swelling. No visible canal hematoma. Disc  levels:  Multilevel degenerative changes. Upper chest: Mild emphysema. Atherosclerotic calcification of the aortic arch. Other: Bilateral carotid bulb calcified plaques. IMPRESSION: 1. No acute intracranial hemorrhage. 2. No acute/traumatic cervical spine pathology. Electronically Signed   By: Anner Crete M.D.   On: 10/19/2018 00:38   Ct Shoulder Right Wo Contrast  Result Date: 10/19/2018 CLINICAL DATA:  84 year old female with right shoulder fracture dislocation. EXAM: CT OF THE UPPER RIGHT EXTREMITY WITHOUT CONTRAST TECHNIQUE: Multidetector CT imaging of the upper right extremity was performed according to the standard protocol. COMPARISON:  Radiograph dated 10/18/2018 FINDINGS: Bones/Joint/Cartilage There is a comminuted and displaced fracture of the right humeral head with impaction of the humeral shaft at the neck. There may be slight anterior subluxation of the right shoulder. There is nondisplaced fracture of the posterior glenoid. The bones are osteopenic. There is a large amount of blood in the joint. Ligaments Suboptimally assessed by CT. Muscles and Tendons There is edema of the rotator cuff musculature. Soft tissues There is stranding and edema of the  soft tissues and fat in the region of the right axilla. IMPRESSION: 1. Displaced comminuted fracture of the right humeral head with impaction at the neck. 2. Nondisplaced fracture of the posterior glenoid. 3. Probable mild anterior subluxation of the right shoulder. 4. Large amount of blood within the right shoulder joint. Electronically Signed   By: Anner Crete M.D.   On: 10/19/2018 03:00   Dg Chest Port 1 View  Result Date: 10/26/2018 CLINICAL DATA:  Dyspnea. EXAM: PORTABLE CHEST 1 VIEW COMPARISON:  Radiograph 08/24/2016 FINDINGS: Lower lung volumes from prior exam. Cardiomegaly is similar. Aortic atherosclerosis. Vascular congestion without evidence pulmonary edema. Streaky right perihilar and left lung base atelectasis. No large pleural effusion or pneumothorax. Right shoulder arthroplasty partially included. IMPRESSION: 1. Low lung volumes with cardiomegaly and vascular congestion. 2. Streaky right perihilar and left lung base atelectasis. Electronically Signed   By: Keith Rake M.D.   On: 10/26/2018 23:48   Dg Shoulder Right Port  Result Date: 10/26/2018 CLINICAL DATA:  Status post reversed total shoulder arthroplasty. EXAM: PORTABLE RIGHT SHOULDER COMPARISON:  Radiographs of October 18, 2018. FINDINGS: The glenoid and humeral components of the prosthesis are well situated. No dislocation is noted. IMPRESSION: Status post right shoulder arthroplasty. Electronically Signed   By: Marijo Conception, M.D.   On: 10/26/2018 19:30    EKG: Orders placed or performed during the hospital encounter of 10/26/18  . EKG 12-Lead  . EKG 12-Lead  . EKG 12 lead  . EKG 12 lead     Hospital Course: Wilburta MARVELINE PROFETA is a 84 y.o. who was admitted to Hospital. They were brought to the operating room on 10/26/2018 and underwent Procedure(s): [place].  Patient tolerated the procedure well and was later transferred to the recovery room and then to the orthopaedic floor for postoperative  care.  They were given PO and IV analgesics for pain control following their surgery.  They were given 24 hours of postoperative antibiotics of  Anti-infectives (From admission, onward)   Start     Dose/Rate Route Frequency Ordered Stop   10/27/18 0600  ceFAZolin (ANCEF) IVPB 2g/100 mL premix     2 g 200 mL/hr over 30 Minutes Intravenous On call to O.R. 10/26/18 1245 10/26/18 1618   10/26/18 2030  ceFAZolin (ANCEF) IVPB 2g/100 mL premix     2 g 200 mL/hr over 30 Minutes Intravenous Every 6 hours 10/26/18 2027 10/27/18 1000   10/26/18  1654  vancomycin (VANCOCIN) powder  Status:  Discontinued       As needed 10/26/18 1655 10/26/18 1825     and started on DVT prophylaxis in the form of Lovenox.   PT and OT were ordered for total joint protocol. While admitted to the floor she developed some shortness of breath.  There was some concern for pulmonary embolus but ultimately had a negative workup for that.  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  That did resolve Withholding of of narcotics.  She continued to progress nicely with good pain control and ability to mobilize with therapy.  She was indicated for discharge home to skilled nursing facility.  She was getting Lovenox and SCDs while in the hospital.  She will transition to twice daily aspirin for DVT prophylaxis now that she is more ambulatory for this upper extremity surgery.  Diet: Regular diet Activity:NWB Follow-up:in 2 weeks Disposition - Nursing Home Discharged Condition: good   Discharge Instructions    Call MD / Call 911   Complete by:  As directed    If you experience chest pain or shortness of breath, CALL 911 and be transported to the hospital emergency room.  If you develope a fever above 101 F, pus (white drainage) or increased drainage or redness at the wound, or calf pain, call your surgeon's office.   Constipation Prevention    Complete by:  As directed    Drink plenty of fluids.  Prune juice may be helpful.  You may use a stool softener, such as Colace (over the counter) 100 mg twice a day.  Use MiraLax (over the counter) for constipation as needed.   Diet - low sodium heart healthy   Complete by:  As directed    Discharge instructions   Complete by:  As directed    Please follow up with your PCP after discharge to review your labs and imaging done here.  You should have a repeat chest x ray as an outpatient with your PCP.  Please follow up with cardiology as an outpatient.   You should have repeat labs to follow up your calcium which was mildly elevated while you were here and your liver enzymes (which were also slightly elevated).  Please follow up with orthopedics as scheduled.    Please ask your PCP to request records from this hospitalization so they know what was done and what the next steps will be.   Increase activity slowly as tolerated   Complete by:  As directed      Allergies as of 10/31/2018      Reactions   Oxybutynin    Bleeding gums, felt sick      Medication List    TAKE these medications   acetaminophen 500 MG tablet Commonly known as:  TYLENOL Take 1,000 mg by mouth 2 (two) times daily.   alendronate 70 MG tablet Commonly known as:  FOSAMAX Take 70 mg by mouth once a week. Take with a full glass of water on an empty stomach.   allopurinol 300 MG tablet Commonly known as:  ZYLOPRIM Take 300 mg by mouth daily with breakfast.   calcium gluconate 500 MG tablet Takes 1 tablet in am and 2 in the evening.   cetirizine 10 MG tablet Commonly known as:  ZYRTEC Take 10 mg by mouth daily.   citalopram 20 MG tablet Commonly known as:  CELEXA Take 20 mg by mouth daily.   FISH OIL PO Take 1 tablet by mouth daily.   furosemide 20 MG tablet Commonly known as:  LASIX Take 20 mg by mouth daily.   HAWTHORNE BERRY PO Take 500 mg by mouth 2 (two) times daily.   hydrOXYzine 25 MG  tablet Commonly known as:  ATARAX/VISTARIL Take 25 mg by mouth at bedtime.   levothyroxine 125 MCG tablet Commonly known as:  SYNTHROID, LEVOTHROID Take 125 mcg by mouth daily before breakfast.   lisinopril 20 MG tablet Commonly known as:  PRINIVIL,ZESTRIL Take 20 mg by mouth daily with breakfast.   Melatonin 2.5 MG Caps Take 2.5 mg by mouth at bedtime.   multivitamin with minerals tablet Take 1 tablet by mouth daily.   omeprazole 20 MG tablet Commonly known as:  PRILOSEC OTC Take 20 mg by mouth daily.   oxyCODONE-acetaminophen 5-325 MG tablet Commonly known as:  PERCOCET Take 1 tablet by mouth every 4 (four) hours as needed.   polyethylene glycol packet Commonly known as:  MIRALAX / GLYCOLAX Take 17 g by mouth daily as needed for mild constipation or moderate constipation.   pravastatin 10 MG tablet Commonly known as:  PRAVACHOL Take 10 mg by mouth every 7 (seven) days.   pyridOXINE 100 MG tablet Commonly known as:  VITAMIN B-6 Take 100 mg by mouth daily.   tolterodine 4 MG 24 hr capsule Commonly known as:  DETROL LA Take 4 mg by mouth daily.   traMADol 50 MG tablet Commonly known as:  ULTRAM Take 1-2 tablets (50-100 mg total) by mouth every 6 (six) hours as needed (mild pain).   VITAMIN B-12 PO Take 1 tablet by mouth daily.   vitamin C 500 MG tablet Commonly known as:  ASCORBIC ACID Take 500 mg by mouth daily.      Follow-up Information    Nicholes Stairs, MD In 2 weeks.   Specialty:  Orthopedic Surgery Why:  For wound re-check, For suture removal Contact information: [address] [place] [address] [phone]           Signed: Geralynn Rile, MD Orthopaedic Surgery 10/31/2018, 2:01 PM

## 2018-10-31 NOTE — Consult Note (Signed)
   [facility] CM Inpatient Consult   10/31/2018  ALAURA SCHIPPERS [birth year] [id number]   Patient screened for high risk score and hospitalizations.  Review reveals patient is for [facility] with a right proximal humerus comminuted fracture. No [facility] Care Management follow up needs with Medicare.  Please place a [facility] Care Management consult or for questions contact:   Natividad Brood, RN BSN [facility] Liaison  [phone] business mobile phone Toll free office [phone]

## 2018-11-02 DIAGNOSIS — D649 Anemia, unspecified: Secondary | ICD-10-CM | POA: Diagnosis not present

## 2018-11-02 DIAGNOSIS — N179 Acute kidney failure, unspecified: Secondary | ICD-10-CM | POA: Diagnosis not present

## 2018-11-02 DIAGNOSIS — Z96611 Presence of right artificial shoulder joint: Secondary | ICD-10-CM | POA: Diagnosis not present

## 2018-11-02 DIAGNOSIS — S42351D Displaced comminuted fracture of shaft of humerus, right arm, subsequent encounter for fracture with routine healing: Secondary | ICD-10-CM | POA: Diagnosis not present

## 2018-11-10 DIAGNOSIS — K5900 Constipation, unspecified: Secondary | ICD-10-CM | POA: Diagnosis not present

## 2018-11-10 DIAGNOSIS — Z96611 Presence of right artificial shoulder joint: Secondary | ICD-10-CM | POA: Diagnosis not present

## 2018-11-10 DIAGNOSIS — S42351D Displaced comminuted fracture of shaft of humerus, right arm, subsequent encounter for fracture with routine healing: Secondary | ICD-10-CM | POA: Diagnosis not present

## 2018-11-13 ENCOUNTER — Inpatient Hospital Stay (HOSPITAL_COMMUNITY)
Admission: EM | Admit: 2018-11-13 | Discharge: 2018-11-17 | DRG: 378 | Disposition: A | Discharge status: Skilled Nursing Facility | Payer: Medicare Other | Attending: Internal Medicine | Admitting: Internal Medicine

## 2018-11-13 DIAGNOSIS — R350 Frequency of micturition: Secondary | ICD-10-CM | POA: Diagnosis not present

## 2018-11-13 DIAGNOSIS — F329 Major depressive disorder, single episode, unspecified: Secondary | ICD-10-CM | POA: Diagnosis present

## 2018-11-13 DIAGNOSIS — K922 Gastrointestinal hemorrhage, unspecified: Secondary | ICD-10-CM | POA: Diagnosis not present

## 2018-11-13 DIAGNOSIS — E785 Hyperlipidemia, unspecified: Secondary | ICD-10-CM | POA: Diagnosis present

## 2018-11-13 DIAGNOSIS — I700 Atherosclerosis of aorta: Secondary | ICD-10-CM | POA: Diagnosis not present

## 2018-11-13 DIAGNOSIS — Z923 Personal history of irradiation: Secondary | ICD-10-CM

## 2018-11-13 DIAGNOSIS — R278 Other lack of coordination: Secondary | ICD-10-CM | POA: Diagnosis not present

## 2018-11-13 DIAGNOSIS — K5900 Constipation, unspecified: Secondary | ICD-10-CM | POA: Diagnosis not present

## 2018-11-13 DIAGNOSIS — C55 Malignant neoplasm of uterus, part unspecified: Secondary | ICD-10-CM | POA: Diagnosis not present

## 2018-11-13 DIAGNOSIS — E039 Hypothyroidism, unspecified: Secondary | ICD-10-CM | POA: Diagnosis present

## 2018-11-13 DIAGNOSIS — Z9221 Personal history of antineoplastic chemotherapy: Secondary | ICD-10-CM | POA: Diagnosis not present

## 2018-11-13 DIAGNOSIS — D62 Acute posthemorrhagic anemia: Secondary | ICD-10-CM | POA: Diagnosis present

## 2018-11-13 DIAGNOSIS — Z8542 Personal history of malignant neoplasm of other parts of uterus: Secondary | ICD-10-CM | POA: Diagnosis not present

## 2018-11-13 DIAGNOSIS — I10 Essential (primary) hypertension: Secondary | ICD-10-CM | POA: Diagnosis present

## 2018-11-13 DIAGNOSIS — R0902 Hypoxemia: Secondary | ICD-10-CM | POA: Diagnosis not present

## 2018-11-13 DIAGNOSIS — R58 Hemorrhage, not elsewhere classified: Secondary | ICD-10-CM | POA: Diagnosis not present

## 2018-11-13 DIAGNOSIS — G4733 Obstructive sleep apnea (adult) (pediatric): Secondary | ICD-10-CM | POA: Diagnosis present

## 2018-11-13 DIAGNOSIS — E119 Type 2 diabetes mellitus without complications: Secondary | ICD-10-CM | POA: Diagnosis present

## 2018-11-13 DIAGNOSIS — Z96611 Presence of right artificial shoulder joint: Secondary | ICD-10-CM | POA: Diagnosis not present

## 2018-11-13 DIAGNOSIS — G8929 Other chronic pain: Secondary | ICD-10-CM | POA: Diagnosis present

## 2018-11-13 DIAGNOSIS — Z8249 Family history of ischemic heart disease and other diseases of the circulatory system: Secondary | ICD-10-CM

## 2018-11-13 DIAGNOSIS — Z6841 Body Mass Index (BMI) 40.0 and over, adult: Secondary | ICD-10-CM

## 2018-11-13 DIAGNOSIS — R41841 Cognitive communication deficit: Secondary | ICD-10-CM | POA: Diagnosis not present

## 2018-11-13 DIAGNOSIS — Z9079 Acquired absence of other genital organ(s): Secondary | ICD-10-CM

## 2018-11-13 DIAGNOSIS — K449 Diaphragmatic hernia without obstruction or gangrene: Secondary | ICD-10-CM | POA: Diagnosis not present

## 2018-11-13 DIAGNOSIS — Z888 Allergy status to other drugs, medicaments and biological substances status: Secondary | ICD-10-CM

## 2018-11-13 DIAGNOSIS — M810 Age-related osteoporosis without current pathological fracture: Secondary | ICD-10-CM | POA: Diagnosis not present

## 2018-11-13 DIAGNOSIS — Z90722 Acquired absence of ovaries, bilateral: Secondary | ICD-10-CM

## 2018-11-13 DIAGNOSIS — K254 Chronic or unspecified gastric ulcer with hemorrhage: Principal | ICD-10-CM | POA: Diagnosis present

## 2018-11-13 DIAGNOSIS — Z9049 Acquired absence of other specified parts of digestive tract: Secondary | ICD-10-CM | POA: Diagnosis not present

## 2018-11-13 DIAGNOSIS — R2681 Unsteadiness on feet: Secondary | ICD-10-CM | POA: Diagnosis not present

## 2018-11-13 DIAGNOSIS — R52 Pain, unspecified: Secondary | ICD-10-CM | POA: Diagnosis not present

## 2018-11-13 DIAGNOSIS — Z79899 Other long term (current) drug therapy: Secondary | ICD-10-CM

## 2018-11-13 DIAGNOSIS — C541 Malignant neoplasm of endometrium: Secondary | ICD-10-CM | POA: Diagnosis not present

## 2018-11-13 DIAGNOSIS — R197 Diarrhea, unspecified: Secondary | ICD-10-CM | POA: Diagnosis not present

## 2018-11-13 DIAGNOSIS — Z96653 Presence of artificial knee joint, bilateral: Secondary | ICD-10-CM | POA: Diagnosis present

## 2018-11-13 DIAGNOSIS — Z8672 Personal history of thrombophlebitis: Secondary | ICD-10-CM | POA: Diagnosis not present

## 2018-11-13 DIAGNOSIS — I959 Hypotension, unspecified: Secondary | ICD-10-CM | POA: Diagnosis not present

## 2018-11-13 DIAGNOSIS — R112 Nausea with vomiting, unspecified: Secondary | ICD-10-CM | POA: Diagnosis not present

## 2018-11-13 DIAGNOSIS — I5020 Unspecified systolic (congestive) heart failure: Secondary | ICD-10-CM | POA: Diagnosis not present

## 2018-11-13 DIAGNOSIS — M109 Gout, unspecified: Secondary | ICD-10-CM | POA: Diagnosis present

## 2018-11-13 DIAGNOSIS — R279 Unspecified lack of coordination: Secondary | ICD-10-CM | POA: Diagnosis not present

## 2018-11-13 DIAGNOSIS — K259 Gastric ulcer, unspecified as acute or chronic, without hemorrhage or perforation: Secondary | ICD-10-CM | POA: Diagnosis not present

## 2018-11-13 DIAGNOSIS — K219 Gastro-esophageal reflux disease without esophagitis: Secondary | ICD-10-CM | POA: Diagnosis present

## 2018-11-13 DIAGNOSIS — Z90710 Acquired absence of both cervix and uterus: Secondary | ICD-10-CM

## 2018-11-13 DIAGNOSIS — Z86718 Personal history of other venous thrombosis and embolism: Secondary | ICD-10-CM | POA: Diagnosis not present

## 2018-11-13 DIAGNOSIS — Z79891 Long term (current) use of opiate analgesic: Secondary | ICD-10-CM

## 2018-11-13 DIAGNOSIS — Z743 Need for continuous supervision: Secondary | ICD-10-CM | POA: Diagnosis not present

## 2018-11-13 DIAGNOSIS — K920 Hematemesis: Secondary | ICD-10-CM | POA: Diagnosis not present

## 2018-11-13 DIAGNOSIS — Z7983 Long term (current) use of bisphosphonates: Secondary | ICD-10-CM

## 2018-11-13 DIAGNOSIS — M6281 Muscle weakness (generalized): Secondary | ICD-10-CM | POA: Diagnosis not present

## 2018-11-13 DIAGNOSIS — G4700 Insomnia, unspecified: Secondary | ICD-10-CM | POA: Diagnosis not present

## 2018-11-13 DIAGNOSIS — Z79890 Hormone replacement therapy (postmenopausal): Secondary | ICD-10-CM

## 2018-11-13 LAB — COMPREHENSIVE METABOLIC PANEL
ALT: 17 U/L (ref 0–44)
AST: 22 U/L (ref 15–41)
Albumin: 2.6 g/dL — ABNORMAL LOW (ref 3.5–5.0)
Alkaline Phosphatase: 128 U/L — ABNORMAL HIGH (ref 38–126)
Anion gap: 8 (ref 5–15)
BUN: 60 mg/dL — ABNORMAL HIGH (ref 8–23)
CHLORIDE: 100 mmol/L (ref 98–111)
CO2: 27 mmol/L (ref 22–32)
CREATININE: 1.14 mg/dL — AB (ref 0.44–1.00)
Calcium: 9.2 mg/dL (ref 8.9–10.3)
GFR calc Af Amer: 52 mL/min — ABNORMAL LOW (ref >60)
GFR calc non Af Amer: 45 mL/min — ABNORMAL LOW (ref >60)
Glucose, Bld: 163 mg/dL — ABNORMAL HIGH (ref 70–99)
Potassium: 5.6 mmol/L — ABNORMAL HIGH (ref 3.5–5.1)
Sodium: 135 mmol/L (ref 135–145)
Total Bilirubin: 0.8 mg/dL (ref 0.3–1.2)
Total Protein: 5.2 g/dL — ABNORMAL LOW (ref 6.5–8.1)

## 2018-11-13 LAB — CBC WITH DIFFERENTIAL/PLATELET
Abs Immature Granulocytes: 0.05 10*3/uL (ref 0.00–0.07)
Basophils Absolute: 0 10*3/uL (ref 0.0–0.1)
Basophils Relative: 0 %
EOS PCT: 0 %
Eosinophils Absolute: 0 10*3/uL (ref 0.0–0.5)
HEMATOCRIT: 24 % — AB (ref 36.0–46.0)
Hemoglobin: 7.2 g/dL — ABNORMAL LOW (ref 12.0–15.0)
Immature Granulocytes: 1 %
LYMPHS ABS: 1 10*3/uL (ref 0.7–4.0)
LYMPHS PCT: 10 %
MCH: 30 pg (ref 26.0–34.0)
MCHC: 30 g/dL (ref 30.0–36.0)
MCV: 100 fL (ref 80.0–100.0)
Monocytes Absolute: 0.5 10*3/uL (ref 0.1–1.0)
Monocytes Relative: 5 %
Neutro Abs: 8.4 10*3/uL — ABNORMAL HIGH (ref 1.7–7.7)
Neutrophils Relative %: 84 %
Platelets: 244 10*3/uL (ref 150–400)
RBC: 2.4 MIL/uL — ABNORMAL LOW (ref 3.87–5.11)
RDW: 14.1 % (ref 11.5–15.5)
WBC: 10.1 10*3/uL (ref 4.0–10.5)
nRBC: 0 % (ref 0.0–0.2)

## 2018-11-13 LAB — IRON AND TIBC
IRON: 46 ug/dL (ref 28–170)
Saturation Ratios: 16 % (ref 10.4–31.8)
TIBC: 283 ug/dL (ref 250–450)
UIBC: 237 ug/dL

## 2018-11-13 LAB — FERRITIN: Ferritin: 25 ng/mL (ref 11–307)

## 2018-11-13 LAB — PROTIME-INR
INR: 1.23
PROTHROMBIN TIME: 15.4 s — AB (ref 11.4–15.2)

## 2018-11-13 LAB — FOLATE: FOLATE: 17.5 ng/mL (ref >5.9)

## 2018-11-13 LAB — VITAMIN B12: Vitamin B-12: 618 pg/mL (ref 180–914)

## 2018-11-13 LAB — RETICULOCYTES
IMMATURE RETIC FRACT: 26.8 % — AB (ref 2.3–15.9)
RBC.: 2.1 MIL/uL — ABNORMAL LOW (ref 3.87–5.11)
RETIC CT PCT: 4.2 % — AB (ref 0.4–3.1)
Retic Count, Absolute: 88.2 10*3/uL (ref 19.0–186.0)

## 2018-11-13 LAB — POC OCCULT BLOOD, ED: Fecal Occult Bld: POSITIVE — AB

## 2018-11-13 LAB — PREPARE RBC (CROSSMATCH)

## 2018-11-13 MED ORDER — SODIUM CHLORIDE 0.9 % IV SOLN: 250.0000 mL | INTRAVENOUS | Status: DC | PRN

## 2018-11-13 MED ORDER — ONDANSETRON HCL 4 MG PO TABS: 4.0000 mg | ORAL_TABLET | Freq: Four times a day (QID) | ORAL | Status: DC | PRN

## 2018-11-13 MED ORDER — SODIUM CHLORIDE 0.9% FLUSH
3.0000 mL | Freq: Two times a day (BID) | INTRAVENOUS | Status: DC
  Administered 2018-11-14 – 2018-11-17 (×6): 3 mL via INTRAVENOUS

## 2018-11-13 MED ORDER — SODIUM CHLORIDE 0.9% FLUSH: 3.0000 mL | INTRAVENOUS | Status: DC | PRN

## 2018-11-13 MED ORDER — ONDANSETRON HCL 4 MG/2ML IJ SOLN: 4.0000 mg | Freq: Four times a day (QID) | INTRAMUSCULAR | Status: DC | PRN

## 2018-11-13 MED ORDER — SODIUM CHLORIDE 0.9 % IV BOLUS
1000.0000 mL | Freq: Once | INTRAVENOUS | Status: AC
  Administered 2018-11-13: 1000 mL via INTRAVENOUS

## 2018-11-13 MED ORDER — ONDANSETRON HCL 4 MG/2ML IJ SOLN
4.0000 mg | Freq: Once | INTRAMUSCULAR | Status: AC
  Administered 2018-11-13: 4 mg via INTRAVENOUS
  Filled 2018-11-13: qty 2

## 2018-11-13 MED ORDER — SODIUM CHLORIDE 0.9% IV SOLUTION: Freq: Once | INTRAVENOUS | Status: DC

## 2018-11-13 MED ORDER — PANTOPRAZOLE SODIUM 40 MG IV SOLR
40.0000 mg | Freq: Once | INTRAVENOUS | Status: AC
  Administered 2018-11-13: 40 mg via INTRAVENOUS
  Filled 2018-11-13: qty 40

## 2018-11-13 MED ORDER — SODIUM CHLORIDE 0.9 % IV SOLN
8.0000 mg/h | INTRAVENOUS | Status: DC
  Administered 2018-11-13 – 2018-11-14 (×2): 8 mg/h via INTRAVENOUS
  Filled 2018-11-13 (×4): qty 80

## 2018-11-13 NOTE — H&P (Signed)
History and Physical    Melinda Kerr:[id number] DOB: [birth year] DOA: 11/13/2018  PCP: Maury Dus, MD  Patient coming from: Rehab  Chief Complaint: Vomiting blood  HPI: Melinda Kerr is a 84 y.o. female with medical history significant of morbid obesity, congestive heart failure, endometrial cancer, hypertension just discharged on February 3 from orthopedic team for repair of a right proximal humerus comminuted fracture and reverse shoulder arthroplasty has been in rehab since comes in with 1 day of vomiting bright red blood at least 10 times today and then subsequently started having melanotic stool.  Patient denies any epigastric or any abdominal pain anywhere.  She denies any shortness of breath or chest pain.  She denies any fevers.  She denies any history of peptic ulcer disease.  She denies taking any NSAIDs however she does have on her med rec list from the rehab center that she is on oxycodone and Ultram since her recent injury.  At her discharge last week patient had a hemoglobin level of around 9 and is down to 7.2.  Patient is being referred for admission for upper GI bleed.  She has been started on a Protonix drip and has had 2 units of packed red blood cells ordered which have not begun yet.  Her blood pressure initially was low and systolics in the 87O this is improved after liter fluid with a systolic blood pressure over 100.  GI Dr. Darci Needle has been called who is planning on taking her for EGD in the morning.  Review of Systems: As per HPI otherwise 10 point review of systems negative.   Past Medical History:  Diagnosis Date  . Arthritis    "shoulders, knees, lower back" (07/14/2017)  . CHF (congestive heart failure) ([place])    "while in hospital w/hysterectomy in 2017"  . Chronic lower back pain   . Depression   . Diverticulitis of large intestine with abscess 07/13/2017  . DVT (deep venous thrombosis) ([place])   . Dyspnea    "cause I'm over weight"  . Esophageal reflux     . Gout   . Hyperlipidemia    under control  . Hypertension   . Hypothyroidism   . Morbid obesity with BMI of 40.0-44.9, adult ([place])   . Nocturia   . OSA on CPAP    "not wearing it when I sleep in my lift chair" (07/14/2017)  . Osteoarthritis   . Phlebitis    hx of  . Pneumonia ?1989  . Spondylosis    with scoliosis  . Type II diabetes mellitus (HCC)    borderline , diet controlled   . Urinary frequency   . Uterine cancer (HCC)    S/P chemo, radiation, hysterectomy    Past Surgical History:  Procedure Laterality Date  . APPENDECTOMY    . CATARACT EXTRACTION, BILATERAL Bilateral 2018  . COLONOSCOPY    . DILATATION & CURETTAGE/HYSTEROSCOPY WITH MYOSURE N/A 03/04/2016   Procedure: DILATATION & CURETTAGE/HYSTEROSCOPY ;  Surgeon: Azucena Fallen, MD;  Location: [facility];  Service: Gynecology;  Laterality: N/A;  polyp removal   . HERNIA REPAIR    . IR GENERIC HISTORICAL  05/15/2016   IR US GUIDE VASC ACCESS RIGHT 05/15/2016 Aletta Edouard, MD [facility]  . IR GENERIC HISTORICAL  05/15/2016   IR FLUORO GUIDE CV LINE RIGHT 05/15/2016 Aletta Edouard, MD [facility]  . IR REMOVAL TUN ACCESS W/ PORT W/O FL MOD SED  04/19/2018  . LAPAROSCOPIC CHOLECYSTECTOMY  2004  . LAPAROSCOPIC INCISIONAL /  UMBILICAL / VENTRAL HERNIA REPAIR  2004   "w/gallbladder OR"  . LYMPH NODE BIOPSY N/A 03/26/2016   Procedure: [place] LYMPH NODE BIOPSY;  Surgeon: Everitt Amber, MD;  Location: [facility];  Service: Gynecology;  Laterality: N/A;  . REVERSE SHOULDER ARTHROPLASTY Right 10/26/2018   Procedure: REVERSE SHOULDER ARTHROPLASTY;  Surgeon: Nicholes Stairs, MD;  Location: [facility];  Service: Orthopedics;  Laterality: Right;  . ROBOTIC ASSISTED TOTAL HYSTERECTOMY WITH BILATERAL SALPINGO OOPHERECTOMY N/A 03/26/2016   Procedure: XI ROBOTIC ASSISTED TOTAL Laproscopic HYSTERECTOMY WITH BILATERAL SALPINGO OOPHORECTOMY;  Surgeon: Everitt Amber, MD;  Location: [facility];  Service: Gynecology;  Laterality: N/A;  . TOTAL KNEE  ARTHROPLASTY Left 10/09/2013   Procedure: LEFT TOTAL KNEE ARTHROPLASTY;  Surgeon: [name], MD;  Location: [facility];  Service: Orthopedics;  Laterality: Left;  . TOTAL KNEE ARTHROPLASTY Right 04/09/2014   Procedure: RIGHT TOTAL KNEE ARTHROPLASTY;  Surgeon: [name], MD;  Location: [facility];  Service: Orthopedics;  Laterality: Right;  . TUBAL LIGATION  1982  . VARICOSE VEIN SURGERY Bilateral 1968  . [place]     reports that she has never smoked. She has never used smokeless tobacco. She reports that she does not drink alcohol or use drugs.  Allergies  Allergen Reactions  . Oxybutynin Other (See Comments)    Bleeding gums, felt sick    Family History  Problem Relation Age of Onset  . Heart disease Mother   . Heart disease Father   . CVA Father     Prior to Admission medications   Medication Sig Start Date End Date Taking? Authorizing Provider  acetaminophen (TYLENOL) 500 MG tablet Take 1,000 mg by mouth 2 (two) times daily.    Yes [provider]  alendronate (FOSAMAX) 70 MG tablet Take 70 mg by mouth every Wednesday. Take with a full glass of water on an empty stomach.    Yes [provider]  allopurinol (ZYLOPRIM) 300 MG tablet Take 300 mg by mouth daily.    Yes [provider]  calcium gluconate 500 MG tablet Take 1-2 tablets by mouth See admin instructions. Take 1 tablet (500 mg) by mouth every morning and 2 tablets (1000 mg) at bedtime   Yes [provider]  cetirizine (ZYRTEC) 10 MG tablet Take 10 mg by mouth daily.   Yes [provider]  citalopram (CELEXA) 20 MG tablet Take 20 mg by mouth daily.  01/10/15  Yes [provider]  furosemide (LASIX) 20 MG tablet Take 20 mg by mouth daily. 01/10/15  Yes [provider]  hydrOXYzine (ATARAX/VISTARIL) 25 MG tablet Take 25 mg by mouth at bedtime.   Yes [provider]  levothyroxine (SYNTHROID, LEVOTHROID) 125 MCG tablet Take 125 mcg by  mouth daily at 6 (six) AM.    Yes [provider]  lisinopril (PRINIVIL,ZESTRIL) 20 MG tablet Take 20 mg by mouth daily.    Yes [provider]  Melatonin 3 MG TABS Take 3 mg by mouth at bedtime.    Yes [provider]  nystatin (NYSTATIN) powder Apply topically See admin instructions. Apply topically to abdominal skin folds and under bilateral breast for intertrigo - twice daily - please make sure skin has been cleaned and is dry before applying   Yes [provider]  Omega-3 Fatty Acids (FISH OIL) 1000 MG CAPS Take 1,000 mg by mouth daily.    Yes [provider]  omeprazole (PRILOSEC OTC) 20 MG tablet Take 20  mg by mouth daily.   Yes [provider]  ondansetron (ZOFRAN) 4 MG tablet Take 4 mg by mouth 4 (four) times daily as needed for nausea or vomiting.   Yes [provider]  oxyCODONE-acetaminophen (PERCOCET) 5-325 MG tablet Take 1 tablet by mouth every 4 (four) hours as needed. Patient taking differently: Take 1 tablet by mouth every 4 (four) hours as needed (pain).  10/19/18  Yes Larene Pickett, PA-C  pravastatin (PRAVACHOL) 10 MG tablet Take 10 mg by mouth every Wednesday. At bedtime 02/14/18  Yes [provider]  pyridOXINE (VITAMIN B-6) 100 MG tablet Take 100 mg by mouth daily.   Yes [provider]  senna-docusate (SENNA S) 8.6-50 MG tablet Take 2 tablets by mouth daily.   Yes [provider]  tolterodine (DETROL LA) 4 MG 24 hr capsule Take 4 mg by mouth every evening.    Yes [provider]  traMADol (ULTRAM) 50 MG tablet Take 1-2 tablets (50-100 mg total) by mouth every 6 (six) hours as needed (mild pain). Patient taking differently: Take 50 mg by mouth every 6 (six) hours as needed (mild pain).  04/12/14  Yes [place], Alexzandrew L, PA-C  vitamin B-12 (CYANOCOBALAMIN) 100 MCG tablet Take 100 mcg by mouth daily.    Yes [provider]  vitamin C (ASCORBIC ACID) 500 MG tablet Take 500  mg by mouth daily.   Yes [provider]  polyethylene glycol (MIRALAX / GLYCOLAX) packet Take 17 g by mouth daily as needed for mild constipation or moderate constipation. Patient not taking: Reported on 11/13/2018 04/12/14   Joelene Millin, PA-C    Physical Exam: Vitals:   11/13/18 1750 11/13/18 1752 11/13/18 1900 11/13/18 1915  BP:  (!) 80/37 (!) 83/34 (!) 95/59  Pulse:  76 82 86  Resp:  20 (!) 23 20  Temp:  99.1 F (37.3 C)    TempSrc:  Rectal    SpO2: 99% 97% 95% 94%      Constitutional: NAD, calm, comfortable Vitals:   11/13/18 1750 11/13/18 1752 11/13/18 1900 11/13/18 1915  BP:  (!) 80/37 (!) 83/34 (!) 95/59  Pulse:  76 82 86  Resp:  20 (!) 23 20  Temp:  99.1 F (37.3 C)    TempSrc:  Rectal    SpO2: 99% 97% 95% 94%   Eyes: PERRL, lids and conjunctivae normal ENMT: Mucous membranes are moist. Posterior pharynx clear of any exudate or lesions.Normal dentition.  Neck: normal, supple, no masses, no thyromegaly Respiratory: clear to auscultation bilaterally, no wheezing, no crackles. Normal respiratory effort. No accessory muscle use.  Cardiovascular: Regular rate and rhythm, no murmurs / rubs / gallops. No extremity edema. 2+ pedal pulses. No carotid bruits.  Abdomen: no tenderness, no masses palpated. No hepatosplenomegaly. Bowel sounds positive.  Musculoskeletal: no clubbing / cyanosis. No joint deformity upper and lower extremities. Good ROM, no contractures. Normal muscle tone.  Skin: no rashes, lesions, ulcers. No induration Neurologic: CN 2-12 grossly intact. Sensation intact, DTR normal. Strength 5/5 in all 4.  Psychiatric: Normal judgment and insight. Alert and oriented x 3. Normal mood.    Labs on Admission: I have personally reviewed following labs and imaging studies  CBC: Recent Labs  Lab 11/13/18 1810  WBC 10.1  NEUTROABS 8.4*  HGB 7.2*  HCT 24.0*  MCV 100.0  PLT 573   Basic Metabolic Panel: Recent Labs  Lab 11/13/18 1810  NA  135  K 5.6*  CL 100  CO2  27  GLUCOSE 163*  BUN 60*  CREATININE 1.14*  CALCIUM 9.2   GFR: CrCl cannot be calculated (Unknown ideal weight.). Liver Function Tests: Recent Labs  Lab 11/13/18 1810  AST 22  ALT 17  ALKPHOS 128*  BILITOT 0.8  PROT 5.2*  ALBUMIN 2.6*   No results for input(s): LIPASE, AMYLASE in the last 168 hours. No results for input(s): AMMONIA in the last 168 hours. Coagulation Profile: Recent Labs  Lab 11/13/18 1925  INR 1.23   Cardiac Enzymes: No results for input(s): CKTOTAL, CKMB, CKMBINDEX, TROPONINI in the last 168 hours. BNP (last 3 results) No results for input(s): PROBNP in the last 8760 hours. HbA1C: No results for input(s): HGBA1C in the last 72 hours. CBG: No results for input(s): GLUCAP in the last 168 hours. Lipid Profile: No results for input(s): CHOL, HDL, LDLCALC, TRIG, CHOLHDL, LDLDIRECT in the last 72 hours. Thyroid Function Tests: No results for input(s): TSH, T4TOTAL, FREET4, T3FREE, THYROIDAB in the last 72 hours. Anemia Panel: No results for input(s): VITAMINB12, FOLATE, FERRITIN, TIBC, IRON, RETICCTPCT in the last 72 hours. Urine analysis:    Component Value Date/Time   COLORURINE STRAW (A) 07/13/2017 1858   APPEARANCEUR CLEAR 07/13/2017 1858   LABSPEC 1.042 (H) 07/13/2017 1858   LABSPEC 1.020 05/04/2016 1006   PHURINE 6.0 07/13/2017 1858   GLUCOSEU NEGATIVE 07/13/2017 1858   GLUCOSEU Negative 05/04/2016 1006   HGBUR SMALL (A) 07/13/2017 [place] 07/13/2017 1858   BILIRUBINUR Negative 05/04/2016 1006   [place] 07/13/2017 1858   PROTEINUR NEGATIVE 07/13/2017 1858   UROBILINOGEN 0.2 05/04/2016 1006   NITRITE NEGATIVE 07/13/2017 1858   LEUKOCYTESUR NEGATIVE 07/13/2017 1858   LEUKOCYTESUR Small 05/04/2016 1006   Sepsis Labs: !!!!!!!!!!!!!!!!!!!!!!!!!!!!!!!!!!!!!!!!!!!! @LABRCNTIP (procalcitonin:4,lacticidven:4) )No results found for this or any previous visit (from the past 240 hour(s)).     Radiological Exams on Admission: No results found.  Old chart reviewed Case discussed with PA in the emergency department  Assessment/Plan 84 year old female with acute upper GI bleed Principal Problem:   GIB (gastrointestinal bleeding)-patient has no history of peptic ulcer disease.  She has no history of liver disease either.  May be NSAID induced from recent injury requiring pain medications.  Systolic blood pressure much improved after IV fluid resuscitation in the emergency department.  Continue with transfusing 2 units of packed red blood cells tonight.  Admit to stepdown unit.  Placed on Protonix drip.  Anemia panel is also been pulled and is pending.  Keep n.p.o. after midnight and clear liquid until then for likely EGD in the morning.  If patient deteriorates call GI again tonight for more emergent endoscopy needs.  Active Problems:   Anemia associated with acute blood loss-anemia panel pending.  Transfuse 2 units packed red blood cells.    Essential hypertension-holding all blood pressure meds at this time in the setting of GI bleed with hypotension as above    Malignant neoplasm of uterus (HCC)-stable    History of DVT (deep vein thrombosis)-noted.  DVT prophylaxis with SCDs only.  Any further anticoagulation is currently contraindicated in setting of acute GI bleed    S/P reverse total shoulder arthroplasty, right-noted    DVT prophylaxis: SCDs Code Status: Full Family Communication: None Disposition Plan: Days Consults called: GI Dr. Penelope Coop Admission status: Admission   Missi Mcmackin A MD [facility]  If 7PM-7AM, please contact night-coverage [email or web address] Password [facility]  11/13/2018, 8:31 PM

## 2018-11-13 NOTE — ED Triage Notes (Signed)
Per [facility]: Patient to ED from [facility] c/o new onset N/V/D today, reportedly with some hematemesis. Patient denies abdominal pain, no fevers/chills. EMS VS: 93/52, P 83 regular, RR 18, 99% RA.

## 2018-11-13 NOTE — ED Notes (Signed)
Attempted to call report to floor x2. 

## 2018-11-13 NOTE — ED Notes (Signed)
Recollect blue top

## 2018-11-13 NOTE — ED Notes (Signed)
Attempted to call report to floor 

## 2018-11-13 NOTE — ED Provider Notes (Signed)
Medical screening examination/treatment/procedure(s) were conducted as a shared visit with non-physician practitioner(s) and myself.  I personally evaluated the patient during the encounter.   Pt presents with sx concerning for GI bleed.  Hypotensive in the ED but remains alert in no distress.   Will proceed with GI bleed workup, resucitate with IV fluids.  T&S.  Monitor closely.   Dorie Rank, MD 11/16/18 1335

## 2018-11-13 NOTE — ED Provider Notes (Signed)
[facility] EMERGENCY DEPARTMENT Provider Note   CSN: [id number] Arrival date & time: 11/13/18  1743     History   Chief Complaint Chief Complaint  Patient presents with  . Emesis  . Diarrhea    HPI Melinda Kerr is a 84 y.o. female with a past medical history of diverticulitis, hypertension, obesity, uterine cancer status post hysterectomy, DM 2, who presents today for evaluation of approximately 10 episodes of vomit mixed with bright red blood and dark brown/maroon diarrhea x3.  She reports that this started when she woke up this morning.  She denies any abdominal pain.  She reports generally feeling poorly and nauseous however denies any recent fevers or chills.  No chest pain or shortness of breath.  She says she has never had anything like this before.  She is not anticoagulated.  She recently had a surgery on her right shoulder, denies any concerns or complications with that.  She thinks that her symptoms may be related to eating a [place] sandwich last night.  HPI  Past Medical History:  Diagnosis Date  . Arthritis    "shoulders, knees, lower back" (07/14/2017)  . CHF (congestive heart failure) ([place])    "while in hospital w/hysterectomy in 2017"  . Chronic lower back pain   . Depression   . Diverticulitis of large intestine with abscess 07/13/2017  . DVT (deep venous thrombosis) ([place])   . Dyspnea    "cause I'm over weight"  . Esophageal reflux   . Gout   . Hyperlipidemia    under control  . Hypertension   . Hypothyroidism   . Morbid obesity with BMI of 40.0-44.9, adult ([place])   . Nocturia   . OSA on CPAP    "not wearing it when I sleep in my lift chair" (07/14/2017)  . Osteoarthritis   . Phlebitis    hx of  . Pneumonia ?1989  . Spondylosis    with scoliosis  . Type II diabetes mellitus (HCC)    borderline , diet controlled   . Urinary frequency   . Uterine cancer (HCC)    S/P chemo, radiation, hysterectomy    Patient Active  Problem List   Diagnosis Date Noted  . GIB (gastrointestinal bleeding) 11/13/2018  . Closed fracture of right proximal humerus 10/26/2018  . S/P reverse total shoulder arthroplasty, right 10/26/2018  . Acute appendicitis   . Diverticulitis of large intestine with abscess 07/13/2017  . Colitis 07/13/2017  . Aortic atherosclerosis ([place]) 09/12/2016  . Coronary artery calcification 09/12/2016  . Hiatal hernia 09/12/2016  . Lumbar disc disease 09/12/2016  . Chemotherapy-induced thrombocytopenia 08/04/2016  . Chemotherapy induced neutropenia ([place]) 05/26/2016  . Port catheter in place 05/26/2016  . Encounter for antineoplastic chemotherapy 05/26/2016  . Urinary frequency 04/24/2016  . Obstructive sleep apnea on CPAP 04/24/2016  . History of DVT (deep vein thrombosis) 04/24/2016  . Endometrial cancer, FIGO stage IIIC ([place]) 04/24/2016  . Malignant neoplasm of endometrium ([place]) 03/26/2016  . Dyspnea on exertion 03/19/2016  . Malignant neoplasm of uterus ([place]) 03/19/2016  . Status post right knee replacement 04/16/2014  . Essential hypertension 04/16/2014  . Postoperative anemia due to acute blood loss 04/12/2014  . Status post total left knee replacement 10/14/2013  . Thyroid activity decreased 10/14/2013  . UI (urinary incontinence) 10/14/2013  . Dyslipidemia 10/14/2013  . Constipation 10/14/2013  . Gout 10/14/2013  . Anemia 10/14/2013  . Allergic rhinitis 10/14/2013  . Insomnia 10/14/2013  . Depression 10/14/2013  .  OA (osteoarthritis) of knee 10/09/2013  . OSA on CPAP 08/10/2013  . Morbid obesity with BMI of 45.0-49.9, adult [facility])     Past Surgical History:  Procedure Laterality Date  . APPENDECTOMY    . CATARACT EXTRACTION, BILATERAL Bilateral 2018  . COLONOSCOPY    . DILATATION & CURETTAGE/HYSTEROSCOPY WITH MYOSURE N/A 03/04/2016   Procedure: DILATATION & CURETTAGE/HYSTEROSCOPY ;  Surgeon: Azucena Fallen, MD;  Location: [facility];  Service: Gynecology;  Laterality: N/A;  polyp  removal   . HERNIA REPAIR    . IR GENERIC HISTORICAL  05/15/2016   IR US GUIDE VASC ACCESS RIGHT 05/15/2016 Aletta Edouard, MD [facility]  . IR GENERIC HISTORICAL  05/15/2016   IR FLUORO GUIDE CV LINE RIGHT 05/15/2016 Aletta Edouard, MD [facility]  . IR REMOVAL TUN ACCESS W/ PORT W/O FL MOD SED  04/19/2018  . LAPAROSCOPIC CHOLECYSTECTOMY  2004  . LAPAROSCOPIC INCISIONAL / UMBILICAL / VENTRAL HERNIA REPAIR  2004   "w/gallbladder OR"  . LYMPH NODE BIOPSY N/A 03/26/2016   Procedure: [place] LYMPH NODE BIOPSY;  Surgeon: Everitt Amber, MD;  Location: [facility];  Service: Gynecology;  Laterality: N/A;  . REVERSE SHOULDER ARTHROPLASTY Right 10/26/2018   Procedure: REVERSE SHOULDER ARTHROPLASTY;  Surgeon: Nicholes Stairs, MD;  Location: [facility];  Service: Orthopedics;  Laterality: Right;  . ROBOTIC ASSISTED TOTAL HYSTERECTOMY WITH BILATERAL SALPINGO OOPHERECTOMY N/A 03/26/2016   Procedure: XI ROBOTIC ASSISTED TOTAL Laproscopic HYSTERECTOMY WITH BILATERAL SALPINGO OOPHORECTOMY;  Surgeon: Everitt Amber, MD;  Location: [facility];  Service: Gynecology;  Laterality: N/A;  . TOTAL KNEE ARTHROPLASTY Left 10/09/2013   Procedure: LEFT TOTAL KNEE ARTHROPLASTY;  Surgeon: [name], MD;  Location: [facility];  Service: Orthopedics;  Laterality: Left;  . TOTAL KNEE ARTHROPLASTY Right 04/09/2014   Procedure: RIGHT TOTAL KNEE ARTHROPLASTY;  Surgeon: [name], MD;  Location: [facility];  Service: Orthopedics;  Laterality: Right;  . TUBAL LIGATION  1982  . VARICOSE VEIN SURGERY Bilateral 1968  . [place]     OB History   No obstetric history on file.      Home Medications    Prior to Admission medications   Medication Sig Start Date End Date Taking? Authorizing Provider  acetaminophen (TYLENOL) 500 MG tablet Take 1,000 mg by mouth 2 (two) times daily.    Yes [provider]  alendronate (FOSAMAX) 70 MG tablet Take 70 mg by mouth every Wednesday. Take with a full glass of water on  an empty stomach.    Yes [provider]  allopurinol (ZYLOPRIM) 300 MG tablet Take 300 mg by mouth daily.    Yes [provider]  calcium gluconate 500 MG tablet Take 1-2 tablets by mouth See admin instructions. Take 1 tablet (500 mg) by mouth every morning and 2 tablets (1000 mg) at bedtime   Yes [provider]  cetirizine (ZYRTEC) 10 MG tablet Take 10 mg by mouth daily.   Yes [provider]  citalopram (CELEXA) 20 MG tablet Take 20 mg by mouth daily.  01/10/15  Yes [provider]  furosemide (LASIX) 20 MG tablet Take 20 mg by mouth daily. 01/10/15  Yes [provider]  hydrOXYzine (ATARAX/VISTARIL) 25 MG tablet Take 25 mg by mouth at bedtime.   Yes [provider]  levothyroxine (SYNTHROID, LEVOTHROID) 125 MCG tablet Take 125 mcg by mouth daily at 6 (six) AM.    Yes [provider]  lisinopril (PRINIVIL,ZESTRIL) 20 MG tablet Take 20  mg by mouth daily.    Yes [provider]  Melatonin 3 MG TABS Take 3 mg by mouth at bedtime.    Yes [provider]  nystatin (NYSTATIN) powder Apply topically See admin instructions. Apply topically to abdominal skin folds and under bilateral breast for intertrigo - twice daily - please make sure skin has been cleaned and is dry before applying   Yes [provider]  Omega-3 Fatty Acids (FISH OIL) 1000 MG CAPS Take 1,000 mg by mouth daily.    Yes [provider]  omeprazole (PRILOSEC OTC) 20 MG tablet Take 20 mg by mouth daily.   Yes [provider]  ondansetron (ZOFRAN) 4 MG tablet Take 4 mg by mouth 4 (four) times daily as needed for nausea or vomiting.   Yes [provider]  oxyCODONE-acetaminophen (PERCOCET) 5-325 MG tablet Take 1 tablet by mouth every 4 (four) hours as needed. Patient taking differently: Take 1 tablet by mouth every 4 (four) hours as needed (pain).  10/19/18  Yes Larene Pickett, PA-C  pravastatin (PRAVACHOL) 10 MG tablet  Take 10 mg by mouth every Wednesday. At bedtime 02/14/18  Yes [provider]  pyridOXINE (VITAMIN B-6) 100 MG tablet Take 100 mg by mouth daily.   Yes [provider]  senna-docusate (SENNA S) 8.6-50 MG tablet Take 2 tablets by mouth daily.   Yes [provider]  tolterodine (DETROL LA) 4 MG 24 hr capsule Take 4 mg by mouth every evening.    Yes [provider]  traMADol (ULTRAM) 50 MG tablet Take 1-2 tablets (50-100 mg total) by mouth every 6 (six) hours as needed (mild pain). Patient taking differently: Take 50 mg by mouth every 6 (six) hours as needed (mild pain).  04/12/14  Yes Perkins, Alexzandrew L, PA-C  vitamin B-12 (CYANOCOBALAMIN) 100 MCG tablet Take 100 mcg by mouth daily.    Yes [provider]  vitamin C (ASCORBIC ACID) 500 MG tablet Take 500 mg by mouth daily.   Yes [provider]  polyethylene glycol (MIRALAX / GLYCOLAX) packet Take 17 g by mouth daily as needed for mild constipation or moderate constipation. Patient not taking: Reported on 11/13/2018 04/12/14   Joelene Millin, PA-C    Family History Family History  Problem Relation Age of Onset  . Heart disease Mother   . Heart disease Father   . CVA Father     Social History Social History   Tobacco Use  . Smoking status: Never Smoker  . Smokeless tobacco: Never Used  Substance Use Topics  . Alcohol use: No  . Drug use: No     Allergies   Oxybutynin   Review of Systems Review of Systems  Constitutional: Negative for chills and fever.  HENT: Negative for congestion.   Eyes: Negative for visual disturbance.  Respiratory: Negative for chest tightness and shortness of breath.   Cardiovascular: Negative for chest pain.  Gastrointestinal: Positive for blood in stool, diarrhea, nausea and vomiting. Negative for abdominal pain.  Genitourinary: Negative for dysuria.  Musculoskeletal: Negative for back pain and neck pain.  Neurological: Negative for  weakness and headaches.  All other systems reviewed and are negative.    Physical Exam Updated Vital Signs BP (!) 95/59   Pulse 86   Temp 99.1 F (37.3 C) (Rectal)   Resp 20   SpO2 94%   Physical Exam Vitals signs and nursing note reviewed.  Constitutional:      General: She is not  in acute distress.    Appearance: She is well-developed. She is obese. She is ill-appearing.  HENT:     Head: Normocephalic and atraumatic.  Eyes:     Conjunctiva/sclera: Conjunctivae normal.  Neck:     Musculoskeletal: Normal range of motion and neck supple. No neck rigidity.  Cardiovascular:     Rate and Rhythm: Normal rate and regular rhythm.     Pulses:          Radial pulses are 1+ on the right side and 1+ on the left side.       Dorsalis pedis pulses are 1+ on the right side and 1+ on the left side.     Heart sounds: Normal heart sounds. No murmur.  Pulmonary:     Effort: Pulmonary effort is normal. No respiratory distress.     Breath sounds: Normal breath sounds. No decreased breath sounds.  Abdominal:     General: There is no distension.     Palpations: Abdomen is soft.     Tenderness: There is no abdominal tenderness. There is no guarding or rebound.  Musculoskeletal: Normal range of motion.  Skin:    General: Skin is warm and dry.  Neurological:     Mental Status: She is alert.     GCS: GCS eye subscore is 4. GCS verbal subscore is 5. GCS motor subscore is 6.  Psychiatric:        Attention and Perception: Attention normal.        Mood and Affect: Mood normal.        Speech: Speech normal.        Behavior: Behavior normal.      ED Treatments / Results  Labs (all labs ordered are listed, but only abnormal results are displayed) Labs Reviewed  COMPREHENSIVE METABOLIC PANEL - Abnormal; Notable for the following components:      Result Value   Potassium 5.6 (*)    Glucose, Bld 163 (*)    BUN 60 (*)    Creatinine, Ser 1.14 (*)    Total Protein 5.2 (*)    Albumin 2.6 (*)      Alkaline Phosphatase 128 (*)    GFR calc non Af Amer 45 (*)    GFR calc Af Amer 52 (*)    All other components within normal limits  CBC WITH DIFFERENTIAL/PLATELET - Abnormal; Notable for the following components:   RBC 2.40 (*)    Hemoglobin 7.2 (*)    HCT 24.0 (*)    Neutro Abs 8.4 (*)    All other components within normal limits  PROTIME-INR - Abnormal; Notable for the following components:   Prothrombin Time 15.4 (*)    All other components within normal limits  POC OCCULT BLOOD, ED - Abnormal; Notable for the following components:   Fecal Occult Bld POSITIVE (*)    All other components within normal limits  VITAMIN B12  FOLATE  IRON AND TIBC  FERRITIN  RETICULOCYTES  TYPE AND SCREEN  PREPARE RBC (CROSSMATCH)    EKG None  Radiology No results found.  Procedures .Critical Care Performed by: Lorin Glass, PA-C Authorized by: Lorin Glass, PA-C   Critical care provider statement:    Critical care time (minutes):  45   Critical care time was exclusive of:  Separately billable procedures and treating other patients and teaching time   Critical care was time spent personally by me on the following activities:  Discussions with consultants, evaluation of patient's response to treatment,  examination of patient, ordering and performing treatments and interventions, ordering and review of laboratory studies, ordering and review of radiographic studies, pulse oximetry, re-evaluation of patient's condition, obtaining history from patient or surrogate and review of old charts   (including critical care time)  Medications Ordered in ED Medications  0.9 %  sodium chloride infusion (Manually program via Guardrails IV Fluids) (has no administration in time range)  sodium chloride 0.9 % bolus 1,000 mL (1,000 mLs Intravenous New Bag/Given 11/13/18 1922)  pantoprazole (PROTONIX) 80 mg in sodium chloride 0.9 % 250 mL (0.32 mg/mL) infusion (has no administration in time  range)  pantoprazole (PROTONIX) injection 40 mg (has no administration in time range)  pantoprazole (PROTONIX) injection 40 mg (40 mg Intravenous Given 11/13/18 1821)  sodium chloride 0.9 % bolus 1,000 mL (0 mLs Intravenous Stopped 11/13/18 1912)  ondansetron (ZOFRAN) injection 4 mg (4 mg Intravenous Given 11/13/18 1821)     Initial Impression / Assessment and Plan / ED Course  I have reviewed the triage vital signs and the nursing notes.  Pertinent labs & imaging results that were available during my care of the patient were reviewed by me and considered in my medical decision making (see chart for details).  Clinical Course as of Nov 13 2020  Nancy Fetter Nov 13, 2018  1905 Patient already got one liter of IVF since this was drawn.   Potassium(!): 5.6 [EH]  6283 BP 83 systolic   [EH]  1517 BP improving  BP(!): 95/59 [EH]  1943 Spoke with Dr. Penelope Coop, recommends medical admission, keeping patient n.p.o.  He will plan to scope her in the morning.    [EH]  1959 Spoke with Dr. Shanon Brow who requested that I add on an anemia panel.  This is been ordered.   [EH]    Clinical Course User Index [EH] Lorin Glass, PA-C   Patient presents today for evaluation of approximately 10 episodes of bloody vomit and 3-4 episodes of bloody diarrhea.  On exam her bowel movements are frankly melanotic, are Hemoccult positive.  Upon arrival here she was hypotensive, blood pressures systolically in the 61Y to 80s.  She was given 1 L fluid bolus after which her blood pressure raised into the high 07P to 71G systolic.  Labs were obtained and reviewed, hemoglobin is significantly low at 7.2.  Given that she is still appears to be actively bleeding I suspect that this hemoglobin is lagging behind and is in fact lower than 7.2.  Given ongoing bleeding discussed blood administration with patient who accepts blood transfusion.  2 units of blood have been ordered.  Or potassium is slightly high at 6.5.  This was taken before  she got 1 L of IV fluids I suspect that since then this has been treated.    I spoke with on-call GI doctor from [facility] who recommends additional Protonix bolus and drip.  He asked that patient be kept n.p.o. with plans to scope in the morning.   I spoke with Dr. Shanon Brow who will admit patient to hospital.   Final Clinical Impressions(s) / ED Diagnoses   Final diagnoses:  Gastrointestinal hemorrhage, unspecified gastrointestinal hemorrhage type    ED Discharge Orders    None       Ollen Gross 11/13/18 2022    Dorie Rank, MD 11/16/18 [phone]

## 2018-11-14 ENCOUNTER — Inpatient Hospital Stay (HOSPITAL_COMMUNITY): Payer: Medicare Other | Admitting: Certified Registered Nurse Anesthetist

## 2018-11-14 ENCOUNTER — Encounter (HOSPITAL_COMMUNITY)
Admission: EM | Disposition: A | Discharge status: Skilled Nursing Facility | Payer: Self-pay | Source: Home / Self Care | Attending: Internal Medicine

## 2018-11-14 ENCOUNTER — Encounter (HOSPITAL_COMMUNITY): Payer: Self-pay | Admitting: *Deleted

## 2018-11-14 ENCOUNTER — Other Ambulatory Visit: Payer: Self-pay

## 2018-11-14 ENCOUNTER — Ambulatory Visit: Payer: Medicare Other | Admitting: Podiatry

## 2018-11-14 DIAGNOSIS — C541 Malignant neoplasm of endometrium: Secondary | ICD-10-CM

## 2018-11-14 DIAGNOSIS — Z96611 Presence of right artificial shoulder joint: Secondary | ICD-10-CM

## 2018-11-14 DIAGNOSIS — D62 Acute posthemorrhagic anemia: Secondary | ICD-10-CM

## 2018-11-14 DIAGNOSIS — Z86718 Personal history of other venous thrombosis and embolism: Secondary | ICD-10-CM

## 2018-11-14 DIAGNOSIS — I10 Essential (primary) hypertension: Secondary | ICD-10-CM

## 2018-11-14 HISTORY — PX: ESOPHAGOGASTRODUODENOSCOPY (EGD) WITH PROPOFOL: SHX5813

## 2018-11-14 LAB — BASIC METABOLIC PANEL
Anion gap: 4 — ABNORMAL LOW (ref 5–15)
BUN: 66 mg/dL — ABNORMAL HIGH (ref 8–23)
CO2: 26 mmol/L (ref 22–32)
Calcium: 8.1 mg/dL — ABNORMAL LOW (ref 8.9–10.3)
Chloride: 106 mmol/L (ref 98–111)
Creatinine, Ser: 0.91 mg/dL (ref 0.44–1.00)
GFR calc non Af Amer: 59 mL/min — ABNORMAL LOW (ref >60)
Glucose, Bld: 119 mg/dL — ABNORMAL HIGH (ref 70–99)
Potassium: 5 mmol/L (ref 3.5–5.1)
Sodium: 136 mmol/L (ref 135–145)

## 2018-11-14 LAB — CBC
HCT: 23.3 % — ABNORMAL LOW (ref 36.0–46.0)
Hemoglobin: 7.4 g/dL — ABNORMAL LOW (ref 12.0–15.0)
MCH: 30.1 pg (ref 26.0–34.0)
MCHC: 31.8 g/dL (ref 30.0–36.0)
MCV: 94.7 fL (ref 80.0–100.0)
Platelets: 264 10*3/uL (ref 150–400)
RBC: 2.46 MIL/uL — ABNORMAL LOW (ref 3.87–5.11)
RDW: 16.3 % — ABNORMAL HIGH (ref 11.5–15.5)
WBC: 11.2 10*3/uL — ABNORMAL HIGH (ref 4.0–10.5)
nRBC: 0 % (ref 0.0–0.2)

## 2018-11-14 LAB — PREPARE RBC (CROSSMATCH)

## 2018-11-14 SURGERY — ESOPHAGOGASTRODUODENOSCOPY (EGD) WITH PROPOFOL
Anesthesia: Monitor Anesthesia Care

## 2018-11-14 MED ORDER — ALBUMIN HUMAN 5 % IV SOLN
INTRAVENOUS | Status: DC | PRN
  Administered 2018-11-14: 12:00:00 via INTRAVENOUS

## 2018-11-14 MED ORDER — PROPOFOL 500 MG/50ML IV EMUL
INTRAVENOUS | Status: DC | PRN
  Administered 2018-11-14: 50 ug/kg/min via INTRAVENOUS

## 2018-11-14 MED ORDER — SODIUM CHLORIDE 0.9 % IV SOLN
INTRAVENOUS | Status: DC | PRN
  Administered 2018-11-14: 25 ug/min via INTRAVENOUS

## 2018-11-14 MED ORDER — EPHEDRINE SULFATE 50 MG/ML IJ SOLN
INTRAMUSCULAR | Status: DC | PRN
  Administered 2018-11-14 (×2): 10 mg via INTRAVENOUS

## 2018-11-14 MED ORDER — PROPOFOL 10 MG/ML IV BOLUS
INTRAVENOUS | Status: DC | PRN
  Administered 2018-11-14: 20 mg via INTRAVENOUS

## 2018-11-14 MED ORDER — LIDOCAINE HCL (CARDIAC) PF 100 MG/5ML IV SOSY
PREFILLED_SYRINGE | INTRAVENOUS | Status: DC | PRN
  Administered 2018-11-14: 60 mg via INTRATRACHEAL

## 2018-11-14 MED ORDER — ALBUMIN HUMAN 5 % IV SOLN
INTRAVENOUS | Status: AC
  Filled 2018-11-14: qty 250

## 2018-11-14 MED ORDER — SODIUM CHLORIDE 0.9 % IV BOLUS: 500.0000 mL | Freq: Once | INTRAVENOUS | Status: AC

## 2018-11-14 MED ORDER — SODIUM CHLORIDE 0.9 % IV BOLUS
500.0000 mL | Freq: Once | INTRAVENOUS | Status: AC
  Administered 2018-11-14: 500 mL via INTRAVENOUS

## 2018-11-14 MED ORDER — SODIUM CHLORIDE 0.9 % IV SOLN
INTRAVENOUS | Status: DC
  Administered 2018-11-14 (×3): via INTRAVENOUS

## 2018-11-14 MED ORDER — PANTOPRAZOLE SODIUM 40 MG PO TBEC
40.0000 mg | DELAYED_RELEASE_TABLET | Freq: Two times a day (BID) | ORAL | Status: DC
  Administered 2018-11-15 – 2018-11-17 (×5): 40 mg via ORAL
  Filled 2018-11-14 (×5): qty 1

## 2018-11-14 MED ORDER — SODIUM CHLORIDE 0.9 % IV SOLN
INTRAVENOUS | Status: DC
  Administered 2018-11-14: 10:00:00 via INTRAVENOUS

## 2018-11-14 MED ORDER — SODIUM CHLORIDE 0.9 % IV BOLUS: 500.0000 mL | Freq: Once | INTRAVENOUS | Status: DC

## 2018-11-14 MED ORDER — SODIUM CHLORIDE 0.9 % IV SOLN
INTRAVENOUS | Status: DC | PRN
  Administered 2018-11-14: 12:00:00 via INTRAVENOUS

## 2018-11-14 MED ORDER — PHENYLEPHRINE HCL 10 MG/ML IJ SOLN
INTRAMUSCULAR | Status: DC | PRN
  Administered 2018-11-14: 40 ug via INTRAVENOUS
  Administered 2018-11-14: 80 ug via INTRAVENOUS

## 2018-11-14 SURGICAL SUPPLY — 15 items

## 2018-11-14 NOTE — Op Note (Signed)
[facility] Patient Name: Melinda Kerr Procedure Date : 11/14/2018 MRN: [id number] Attending MD: Wonda Horner , MD Date of Birth: July 30, 1935 CSN: [id number] Age: 84 Admit Type: Inpatient Procedure:                Upper GI endoscopy Indications:              Hematemesis Providers:                Wonda Horner, MD, Dorise Hiss, RN, Vista Lawman,                            RN, Charolette Child, Technician, Haze Boyden,                            CRNA Referring MD:              Medicines:                Propofol per Anesthesia Complications:            No immediate complications. Estimated Blood Loss:     Estimated blood loss: none. Procedure:                Pre-Anesthesia Assessment:                           - Prior to the procedure, a History and Physical                            was performed, and patient medications and                            allergies were reviewed. The patient's tolerance of                            previous anesthesia was also reviewed. The risks                            and benefits of the procedure and the sedation                            options and risks were discussed with the patient.                            All questions were answered, and informed consent                            was obtained. Prior Anticoagulants: The patient has                            taken no previous anticoagulant or antiplatelet                            agents. ASA Grade Assessment: II - A patient with  mild systemic disease. After reviewing the risks                            and benefits, the patient was deemed in                            satisfactory condition to undergo the procedure.                           After obtaining informed consent, the endoscope was                            passed under direct vision. Throughout the                            procedure, the patient's blood pressure, pulse, and                             oxygen saturations were monitored continuously. The                            GIF-H190 ([id number]) Olympus gastroscope was                            introduced through the mouth, and advanced to the                            second part of duodenum. The upper GI endoscopy was                            accomplished without difficulty. The patient                            tolerated the procedure well. Scope In: Scope Out: Findings:      The examined esophagus was normal.      A large hiatal hernia was present.      A large hiatal hernia with multiple Cameron ulcers was found. There was       some old blood in the stomach, but no active bleeding.      The examined duodenum was normal. Impression:               - Normal esophagus.                           - Large hiatal hernia.                           - Large hiatal hernia with multiple Cameron ulcers.                           - Normal examined duodenum.                           - No specimens collected. Recommendation:           - Clear liquid diet  today. Then advance as                            tolerated.                           - Continue present medications. Procedure Code(s):        --- Professional ---                           [id number], Esophagogastroduodenoscopy, flexible,                            transoral; diagnostic, including collection of                            specimen(s) by brushing or washing, when performed                            (separate procedure) Diagnosis Code(s):        --- Professional ---                           K44.9, Diaphragmatic hernia without obstruction or                            gangrene                           K25.9, Gastric ulcer, unspecified as acute or                            chronic, without hemorrhage or perforation                           K92.0, Hematemesis CPT copyright 2018 American Medical Association. All rights reserved. The codes  documented in this report are preliminary and upon coder review may  be revised to meet current compliance requirements. Wonda Horner, MD 11/14/2018 12:44:31 PM This report has been signed electronically. Number of Addenda: 0

## 2018-11-14 NOTE — Transfer of Care (Signed)
Immediate Anesthesia Transfer of Care Note  Patient: Neah L Peake  Procedure(s) Performed: ESOPHAGOGASTRODUODENOSCOPY (EGD) WITH PROPOFOL (N/A )  Patient Location: Short Stay  Anesthesia Type:MAC  Level of Consciousness: drowsy and patient cooperative  Airway & Oxygen Therapy: Patient Spontanous Breathing and Patient connected to nasal cannula oxygen  Post-op Assessment: Report given to RN and Post -op Vital signs reviewed and stable  Post vital signs: Reviewed and stable  Last Vitals:  Vitals Value Taken Time  BP 89/43 11/14/2018 12:44 PM  Temp 37.1 C 11/14/2018 12:44 PM  Pulse 88 11/14/2018 12:49 PM  Resp 23 11/14/2018 12:49 PM  SpO2 99 % 11/14/2018 12:49 PM  Vitals shown include unvalidated device data.  Last Pain:  Vitals:   11/14/18 1244  TempSrc: Oral  PainSc: 0-No pain         Complications: No apparent anesthesia complications

## 2018-11-14 NOTE — H&P (Signed)
  Patient in endo dept for egd to evaluate hematemesis.  No change in PE forom earlier this morning. IMP: hematemesis Plan. EGD

## 2018-11-14 NOTE — Progress Notes (Signed)
Pt's BP's soft since returning from EGD today; 66/53, 92/39, 76/59, 77/28.  MD made aware.  Pt is awake, alert and completely asymptomatic currently.  If pt becomes symptomatic or BP's decrease, MD requests 500 ml NS bolus and page MD on call.  Will continue to monitor.

## 2018-11-14 NOTE — Anesthesia Preprocedure Evaluation (Signed)
Anesthesia Evaluation  Patient identified by MRN, date of birth, ID band Patient awake    Reviewed: Allergy & Precautions, H&P , NPO status , Patient's Chart, lab work & pertinent test results  Airway Mallampati: II   Neck ROM: full    Dental   Pulmonary shortness of breath, sleep apnea ,    breath sounds clear to auscultation       Cardiovascular hypertension, +CHF and + DVT   Rhythm:regular Rate:Normal     Neuro/Psych PSYCHIATRIC DISORDERS Depression    GI/Hepatic hiatal hernia, GERD  ,hematemesis   Endo/Other  diabetes, Type 2Hypothyroidism Morbid obesity  Renal/GU      Musculoskeletal  (+) Arthritis ,   Abdominal   Peds  Hematology  (+) Blood dyscrasia, anemia ,   Anesthesia Other Findings   Reproductive/Obstetrics                             Anesthesia Physical Anesthesia Plan  ASA: III  Anesthesia Plan: MAC   Post-op Pain Management:    Induction: Intravenous  PONV Risk Score and Plan: 2 and Propofol infusion and Treatment may vary due to age or medical condition  Airway Management Planned: Nasal Cannula  Additional Equipment:   Intra-op Plan:   Post-operative Plan:   Informed Consent: I have reviewed the patients History and Physical, chart, labs and discussed the procedure including the risks, benefits and alternatives for the proposed anesthesia with the patient or authorized representative who has indicated his/her understanding and acceptance.       Plan Discussed with: CRNA, Anesthesiologist and Surgeon  Anesthesia Plan Comments:         Anesthesia Quick Evaluation

## 2018-11-14 NOTE — Consult Note (Signed)
Subjective:   HPI  The patient is an 84 year old female who came to the emergency room yesterday because of hematemesis. She states that she was vomiting blood yesterday. Reports about 10 times. She tells me it was dark in color but the ER note says bright red. She also started to have melena. She denied epigastric pain. She has been taken a baby aspirin daily. Denies NSAIDs. She has never had an ulcer. She has not vomited since being in the hospital. We were asked to see her in regards to the upper GI bleed. She received 2 units of packed red cells last night.  Review of Systems No chest pain or shortness of breath  Past Medical History:  Diagnosis Date  . Arthritis    "shoulders, knees, lower back" (07/14/2017)  . CHF (congestive heart failure) ([place])    "while in hospital w/hysterectomy in 2017"  . Chronic lower back pain   . Depression   . Diverticulitis of large intestine with abscess 07/13/2017  . DVT (deep venous thrombosis) ([place])   . Dyspnea    "cause I'm over weight"  . Esophageal reflux   . Gout   . Hyperlipidemia    under control  . Hypertension   . Hypothyroidism   . Morbid obesity with BMI of 40.0-44.9, adult ([place])   . Nocturia   . OSA on CPAP    "not wearing it when I sleep in my lift chair" (07/14/2017)  . Osteoarthritis   . Phlebitis    hx of  . Pneumonia ?1989  . Spondylosis    with scoliosis  . Type II diabetes mellitus (HCC)    borderline , diet controlled   . Urinary frequency   . Uterine cancer (HCC)    S/P chemo, radiation, hysterectomy   Past Surgical History:  Procedure Laterality Date  . APPENDECTOMY    . CATARACT EXTRACTION, BILATERAL Bilateral 2018  . COLONOSCOPY    . DILATATION & CURETTAGE/HYSTEROSCOPY WITH MYOSURE N/A 03/04/2016   Procedure: DILATATION & CURETTAGE/HYSTEROSCOPY ;  Surgeon: Azucena Fallen, MD;  Location: [facility];  Service: Gynecology;  Laterality: N/A;  polyp removal   . HERNIA REPAIR    . IR GENERIC HISTORICAL  05/15/2016   IR  US GUIDE VASC ACCESS RIGHT 05/15/2016 Aletta Edouard, MD [facility]  . IR GENERIC HISTORICAL  05/15/2016   IR FLUORO GUIDE CV LINE RIGHT 05/15/2016 Aletta Edouard, MD [facility]  . IR REMOVAL TUN ACCESS W/ PORT W/O FL MOD SED  04/19/2018  . LAPAROSCOPIC CHOLECYSTECTOMY  2004  . LAPAROSCOPIC INCISIONAL / UMBILICAL / VENTRAL HERNIA REPAIR  2004   "w/gallbladder OR"  . LYMPH NODE BIOPSY N/A 03/26/2016   Procedure: [place] LYMPH NODE BIOPSY;  Surgeon: Everitt Amber, MD;  Location: [facility];  Service: Gynecology;  Laterality: N/A;  . REVERSE SHOULDER ARTHROPLASTY Right 10/26/2018   Procedure: REVERSE SHOULDER ARTHROPLASTY;  Surgeon: Nicholes Stairs, MD;  Location: [facility];  Service: Orthopedics;  Laterality: Right;  . ROBOTIC ASSISTED TOTAL HYSTERECTOMY WITH BILATERAL SALPINGO OOPHERECTOMY N/A 03/26/2016   Procedure: XI ROBOTIC ASSISTED TOTAL Laproscopic HYSTERECTOMY WITH BILATERAL SALPINGO OOPHORECTOMY;  Surgeon: Everitt Amber, MD;  Location: [facility];  Service: Gynecology;  Laterality: N/A;  . TOTAL KNEE ARTHROPLASTY Left 10/09/2013   Procedure: LEFT TOTAL KNEE ARTHROPLASTY;  Surgeon: [name], MD;  Location: [facility];  Service: Orthopedics;  Laterality: Left;  . TOTAL KNEE ARTHROPLASTY Right 04/09/2014   Procedure: RIGHT TOTAL KNEE ARTHROPLASTY;  Surgeon: [name], MD;  Location:  [facility];  Service: Orthopedics;  Laterality: Right;  . TUBAL LIGATION  1982  . VARICOSE VEIN SURGERY Bilateral 1968  . [place] EXTRACTION  1963   Social History   Socioeconomic History  . Marital status: Widowed    Spouse name: Not on file  . Number of children: 0  . Years of education: Not on file  . Highest education level: Not on file  Occupational History  . 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 Financial resource strain: Not on file  . Food insecurity:    Worry: Not on file    Inability: Not on file  . Transportation needs:    Medical: Not on file    Non-medical: Not on file   Tobacco Use  . Smoking status: Never Smoker  . Smokeless tobacco: Never Used  Substance and Sexual Activity  . Alcohol use: No  . Drug use: No  . Sexual activity: Never  Lifestyle  . Physical activity:    Days per week: Not on file    Minutes per session: Not on file  . Stress: Not on file  Relationships  . Social connections:    Talks on phone: Not on file    Gets together: Not on file    Attends religious service: Not on file    Active member of club or organization: Not on file    Attends meetings of clubs or organizations: Not on file    Relationship status: Not on file  . Intimate partner violence:    Fear of current or ex partner: Not on file    Emotionally abused: Not on file    Physically abused: Not on file    Forced sexual activity: Not on file  Other Topics Concern  . Not on file  Social History Narrative  . Not on file   family history includes CVA in her father; Heart disease in her father and mother.  Current Facility-Administered Medications:  .  0.9 %  sodium chloride infusion, 250 mL, Intravenous, PRN, Derrill Kay A, MD .  0.9 %  sodium chloride infusion, , Intravenous, Continuous, Anson Fret F, MD, Last Rate: 20 mL/hr at 11/14/18 0943 .  ondansetron (ZOFRAN) tablet 4 mg, 4 mg, Oral, Q6H PRN **OR** ondansetron (ZOFRAN) injection 4 mg, 4 mg, Intravenous, Q6H PRN, Derrill Kay A, MD .  pantoprazole (PROTONIX) 80 mg in sodium chloride 0.9 % 250 mL (0.32 mg/mL) infusion, 8 mg/hr, Intravenous, Continuous, Derrill Kay A, MD, Last Rate: 25 mL/hr at 11/14/18 0637, 8 mg/hr at 11/14/18 0637 .  sodium chloride flush (NS) 0.9 % injection 3 mL, 3 mL, Intravenous, Q12H, Derrill Kay A, MD, 3 mL at 11/14/18 0946 .  sodium chloride flush (NS) 0.9 % injection 3 mL, 3 mL, Intravenous, PRN, Phillips Grout, MD Allergies  Allergen Reactions  . Oxybutynin Other (See Comments)    Bleeding gums, felt sick     Objective:     BP (!) 112/54 (BP Location: Left Arm)    Pulse (!) 101   Temp 98.1 F (36.7 C) (Oral)   Resp 19   Ht 5' (1.524 m)   Wt 115 kg   SpO2 98%   BMI 49.51 kg/m   No acute distress  Heart regular rhythm no murmurs  Lungs clear  Abdomen: Bowel sounds present, soft, nontender, no hepatosplenomegaly  Laboratory No components found for: D1    Assessment:     Upper GI bleed      Plan:  She has been started on PPI therapy. She has received 2 units of packed red cells already. We will plan EGD later today to evaluate the upper GI tract. Lab Results  Component Value Date   HGB 7.4 (L) 11/14/2018   HGB 7.2 (L) 11/13/2018   HGB 8.6 (L) 10/30/2018   HGB 12.9 03/01/2017   HGB 11.4 (L) 10/22/2016   HGB 10.5 (L) 09/10/2016   HCT 23.3 (L) 11/14/2018   HCT 24.0 (L) 11/13/2018   HCT 26.2 (L) 10/30/2018   HCT 39.0 03/01/2017   HCT 35.2 10/22/2016   HCT 32.4 (L) 09/10/2016   ALKPHOS 128 (H) 11/13/2018   ALKPHOS 133 (H) 10/29/2018   ALKPHOS 149 (H) 10/28/2018   ALKPHOS 100 03/01/2017   ALKPHOS 90 10/22/2016   ALKPHOS 113 09/10/2016   AST 22 11/13/2018   AST 28 10/29/2018   AST 36 10/28/2018   AST 19 03/01/2017   AST 18 10/22/2016   AST 17 09/10/2016   ALT 17 11/13/2018   ALT 22 10/29/2018   ALT 29 10/28/2018   ALT 14 03/01/2017   ALT 13 10/22/2016   ALT 14 09/10/2016

## 2018-11-14 NOTE — Progress Notes (Signed)
PROGRESS NOTE    Melinda Kerr  QBH:[id number] DOB: June 01, 1935 DOA: 11/13/2018 PCP: Maury Dus, MD   Brief Narrative:  84 year old female with history of CHF, endometrial cancer, essential hypertension, morbid obesity recently underwent right proximal humerus fracture arthroplasty and was at rehab.  1 day prior to admission she developed hematemesis therefore brought to the hospital.  Denied any use of NSAIDs besides daily baby aspirin.  Has been on oxycodone and Ultram for her injuries.  Upon admission started on PPI drip and gastroenterology was consulted.  During admission received 2 units of PRBC.   Assessment & Plan:   Principal Problem:   GIB (gastrointestinal bleeding) Active Problems:   Essential hypertension   Malignant neoplasm of uterus (HCC)   History of DVT (deep vein thrombosis)   S/P reverse total shoulder arthroplasty, right   Anemia associated with acute blood loss  Upper GI bleed, acute -Continue PPI.  Closely follow hemoglobin -GI consulted, plans for EGD today - Status post units of PRBC, will make sure hemoglobin remained stable. -IV fluids as needed - Advance diet as tolerated after the procedur  Acute blood loss anemia -2 units of PRBC transfusion.  Closely monitor hemoglobin. - Baseline around 8.6, today at 7.4.  Essential hypertension -Blood pressure medications on hold in the setting of acute GI bleed.  Will resume when appropriate  History of DVT -Not on anticoagulation.  Maintain SCDs.  History of malignant uterine neoplasm -Follow-up outpatient   DVT prophylaxis: SCDs Code Status: Full code Family Communication: None at bedside Disposition Plan: Plans for endoscopy today.  Consultants:   GI  Procedures:   Endoscopy today  Antimicrobials:   None   Subjective: No further bleeding since she has been in the hospital.  Review of Systems Otherwise negative except as per HPI, including: General: Denies fever, chills, night  sweats or unintended weight loss. Resp: Denies cough, wheezing, shortness of breath. Cardiac: Denies chest pain, palpitations, orthopnea, paroxysmal nocturnal dyspnea. GI: Denies abdominal pain, nausea, vomiting, diarrhea or constipation GU: Denies dysuria, frequency, hesitancy or incontinence MS: Denies muscle aches, joint pain or swelling Neuro: Denies headache, neurologic deficits (focal weakness, numbness, tingling), abnormal gait Psych: Denies anxiety, depression, SI/HI/AVH Skin: Denies new rashes or lesions ID: Denies sick contacts, exotic exposures, travel  Objective: Vitals:   11/14/18 0200 11/14/18 0407 11/14/18 0500 11/14/18 0821  BP: (!) 111/58 (!) 102/52  (!) 112/54  Pulse:  82  (!) 101  Resp:  20  19  Temp: 98.5 F (36.9 C) 98.4 F (36.9 C)  98.1 F (36.7 C)  TempSrc: Oral Oral  Oral  SpO2:  96%  98%  Weight:   115 kg   Height:        Intake/Output Summary (Last 24 hours) at 11/14/2018 1132 Last data filed at 11/14/2018 0820 Gross per 24 hour  Intake 2973.06 ml  Output 1200 ml  Net 1773.06 ml   Filed Weights   11/13/18 2236 11/14/18 0500  Weight: 115.1 kg 115 kg    Examination:  General exam: Appears calm and comfortable, 2 L nasal cannula Respiratory system: Diminished breath sounds at the bases Cardiovascular system: S1 & S2 heard, RRR. No JVD, murmurs, rubs, gallops or clicks. No pedal edema. Gastrointestinal system: Abdomen is nondistended, soft and nontender. No organomegaly or masses felt. Normal bowel sounds heard. Central nervous system: Alert and oriented. No focal neurological deficits. Extremities: Symmetric 5 x 5 power. Skin: No rashes, lesions or ulcers Psychiatry: Judgement and insight appear normal. Mood &  affect appropriate.     Data Reviewed:   CBC: Recent Labs  Lab 11/13/18 1810 11/14/18 0355  WBC 10.1 11.2*  NEUTROABS 8.4*  --   HGB 7.2* 7.4*  HCT 24.0* 23.3*  MCV 100.0 94.7  PLT 244 122   Basic Metabolic Panel: Recent  Labs  Lab 11/13/18 1810 11/14/18 0355  NA 135 136  K 5.6* 5.0  CL 100 106  CO2 27 26  GLUCOSE 163* 119*  BUN 60* 66*  CREATININE 1.14* 0.91  CALCIUM 9.2 8.1*   GFR: Estimated Creatinine Clearance: 56.1 mL/min (by C-G formula based on SCr of 0.91 mg/dL). Liver Function Tests: Recent Labs  Lab 11/13/18 1810  AST 22  ALT 17  ALKPHOS 128*  BILITOT 0.8  PROT 5.2*  ALBUMIN 2.6*   No results for input(s): LIPASE, AMYLASE in the last 168 hours. No results for input(s): AMMONIA in the last 168 hours. Coagulation Profile: Recent Labs  Lab 11/13/18 1925  INR 1.23   Cardiac Enzymes: No results for input(s): CKTOTAL, CKMB, CKMBINDEX, TROPONINI in the last 168 hours. BNP (last 3 results) No results for input(s): PROBNP in the last 8760 hours. HbA1C: No results for input(s): HGBA1C in the last 72 hours. CBG: No results for input(s): GLUCAP in the last 168 hours. Lipid Profile: No results for input(s): CHOL, HDL, LDLCALC, TRIG, CHOLHDL, LDLDIRECT in the last 72 hours. Thyroid Function Tests: No results for input(s): TSH, T4TOTAL, FREET4, T3FREE, THYROIDAB in the last 72 hours. Anemia Panel: Recent Labs    11/13/18 2013  VITAMINB12 618  FOLATE 17.5  FERRITIN 25  TIBC 283  IRON 46  RETICCTPCT 4.2*   Sepsis Labs: No results for input(s): PROCALCITON, LATICACIDVEN in the last 168 hours.  No results found for this or any previous visit (from the past 240 hour(s)).       Radiology Studies: No results found.      Scheduled Meds: . sodium chloride flush  3 mL Intravenous Q12H   Continuous Infusions: . sodium chloride    . sodium chloride 20 mL/hr at 11/14/18 0943  . pantoprozole (PROTONIX) infusion 8 mg/hr (11/14/18 0637)     LOS: 1 day   Time spent= 35 mins     Arsenio Loader, MD [facility]  If 7PM-7AM, please contact night-coverage [email or web address] 11/14/2018, 11:32 AM

## 2018-11-15 ENCOUNTER — Encounter (HOSPITAL_COMMUNITY): Payer: Self-pay | Admitting: Gastroenterology

## 2018-11-15 LAB — CBC
HCT: 20.3 % — ABNORMAL LOW (ref 36.0–46.0)
HCT: 27.3 % — ABNORMAL LOW (ref 36.0–46.0)
Hemoglobin: 6.4 g/dL — CL (ref 12.0–15.0)
Hemoglobin: 8.5 g/dL — ABNORMAL LOW (ref 12.0–15.0)
MCH: 30.5 pg (ref 26.0–34.0)
MCH: 29.1 pg (ref 26.0–34.0)
MCHC: 31.5 g/dL (ref 30.0–36.0)
MCHC: 31.1 g/dL (ref 30.0–36.0)
MCV: 96.7 fL (ref 80.0–100.0)
MCV: 93.5 fL (ref 80.0–100.0)
PLATELETS: 198 10*3/uL (ref 150–400)
Platelets: 201 10*3/uL (ref 150–400)
RBC: 2.1 MIL/uL — AB (ref 3.87–5.11)
RBC: 2.92 MIL/uL — ABNORMAL LOW (ref 3.87–5.11)
RDW: 17 % — ABNORMAL HIGH (ref 11.5–15.5)
RDW: 17.5 % — AB (ref 11.5–15.5)
WBC: 10.2 10*3/uL (ref 4.0–10.5)
WBC: 8.8 10*3/uL (ref 4.0–10.5)
nRBC: 0.2 % (ref 0.0–0.2)
nRBC: 0 % (ref 0.0–0.2)

## 2018-11-15 LAB — BASIC METABOLIC PANEL
Anion gap: 5 (ref 5–15)
BUN: 34 mg/dL — ABNORMAL HIGH (ref 8–23)
CALCIUM: 8.3 mg/dL — AB (ref 8.9–10.3)
CO2: 23 mmol/L (ref 22–32)
Chloride: 109 mmol/L (ref 98–111)
Creatinine, Ser: 0.85 mg/dL (ref 0.44–1.00)
GFR calc Af Amer: >60 mL/min (ref >60)
GFR calc non Af Amer: >60 mL/min (ref >60)
Glucose, Bld: 95 mg/dL (ref 70–99)
Potassium: 4.6 mmol/L (ref 3.5–5.1)
Sodium: 137 mmol/L (ref 135–145)

## 2018-11-15 LAB — PREPARE RBC (CROSSMATCH)

## 2018-11-15 LAB — MAGNESIUM: Magnesium: 2 mg/dL (ref 1.7–2.4)

## 2018-11-15 MED ORDER — SODIUM CHLORIDE 0.9 % IV BOLUS
500.0000 mL | Freq: Once | INTRAVENOUS | Status: AC
  Administered 2018-11-15: 500 mL via INTRAVENOUS

## 2018-11-15 MED ORDER — ZOLPIDEM TARTRATE 5 MG PO TABS
5.0000 mg | ORAL_TABLET | Freq: Once | ORAL | Status: AC
  Administered 2018-11-15: 5 mg via ORAL
  Filled 2018-11-15: qty 1

## 2018-11-15 MED ORDER — SODIUM CHLORIDE 0.9% IV SOLUTION
Freq: Once | INTRAVENOUS | Status: AC
  Administered 2018-11-15: 13:00:00 via INTRAVENOUS

## 2018-11-15 MED ORDER — TRAMADOL HCL 50 MG PO TABS
50.0000 mg | ORAL_TABLET | Freq: Four times a day (QID) | ORAL | Status: DC | PRN
  Administered 2018-11-15 – 2018-11-17 (×3): 50 mg via ORAL
  Filled 2018-11-15 (×4): qty 1

## 2018-11-15 MED ORDER — ZOLPIDEM TARTRATE 5 MG PO TABS
5.0000 mg | ORAL_TABLET | Freq: Every evening | ORAL | Status: AC | PRN
  Administered 2018-11-15: 5 mg via ORAL
  Filled 2018-11-15: qty 1

## 2018-11-15 NOTE — Consult Note (Signed)
   [facility] CM Inpatient Consult   11/15/2018  Melinda Kerr 09-22-1935 [id number]    Patient screened for potential [facility] Care Management services due to hospital readmission.   Spoke with inpatient RNCM. Patient is from SNF. Likely to return there. No identifiable [facility] Care Management needs at this time.   Marthenia Rolling, MSN-Ed, RN,BSN [facility] Liaison [phone]

## 2018-11-15 NOTE — Evaluation (Signed)
Occupational Therapy Evaluation Patient Details Name: Melinda Kerr MRN: [id number] DOB: 1934/11/07 Today's Date: 11/15/2018    History of Present Illness 84 year old female with history of CHF, endometrial cancer, essential hypertension, morbid obesity recently underwent right proximal humerus fracture arthroplasty and was at rehab.  1 day prior to admission she developed hematemesis therefore brought to the hospital. Patient was at [facility] for rehabilitation 3 weeks s/p total shoulder arthroplasty RUE after having fell and sustained fracture.    Clinical Impression   This 84 yo female admitted with above presents to acute OT with decreased bed mobility, decreased sit<>stand, decreased transfers, decreased use of RUE, and decreased balance all affecting her safety and independence with her basic ADLs. She will benefit from acute OT with follow up back at SNF.  OF NOTE: patient with low HgB this am, however, asymptomatic throughout session. Patient requesting use of BSC, performed under direction of PT/OT team and assist OOB to chair with orders for activity with assist. VSS throughout session, patient now on 2 liters [place]    Follow Up Recommendations  SNF;Supervision/Assistance - 24 hour    Equipment Recommendations  Other (comment)(TBD next venue)       Precautions / Restrictions Precautions Type of Shoulder Precautions: pt states can use arm for ADLs  and no sling needed now but has not progressed to elevation; was OK for pendulums at last admission as well as PROM 30 (ER), 60 (Abd) and 90 (FF) Restrictions Weight Bearing Restrictions: Yes RUE Weight Bearing: Non weight bearing      Mobility Bed Mobility Overal bed mobility: Needs Assistance Bed Mobility: Sit to Supine     Supine to sit: Mod assist;HOB elevated Sit to supine: Mod assist   General bed mobility comments: moderate assist to elevate trunk to upright at EOB, assist to scoot to EOB. Increased time and effort.    Transfers Overall transfer level: Needs assistance Equipment used: 1 person hand held assist Transfers: Sit to/from [facility] Sit to Stand: Min assist Stand pivot transfers: Min assist       General transfer comment: min assist with UE supported on BSC during transition, increased effort. DId not elevate to full extension during transition    Balance Overall balance assessment: Needs assistance Sitting-balance support: No upper extremity supported;Feet supported Sitting balance-Leahy Scale: Good     Standing balance support: Single extremity supported;During functional activity Standing balance-Leahy Scale: Poor Standing balance comment: reliant on external support                           ADL either performed or assessed with clinical judgement   ADL Overall ADL's : Needs assistance/impaired Eating/Feeding: Modified independent;Sitting   Grooming: Minimal assistance;Sitting   Upper Body Bathing: Moderate assistance;Sitting Upper Body Bathing Details (indicate cue type and reason): Due to body habitus and decreased use of RUE Lower Body Bathing: Moderate assistance Lower Body Bathing Details (indicate cue type and reason): min A sit<>stand Upper Body Dressing : Moderate assistance;Sitting   Lower Body Dressing: Maximal assistance Lower Body Dressing Details (indicate cue type and reason): min A sit<>stand Toilet Transfer: Minimal assistance;Stand-pivot;BSC   Toileting- Clothing Manipulation and Hygiene: Total assistance Toileting - Clothing Manipulation Details (indicate cue type and reason): min A sit<>stand             Vision Patient Visual Report: No change from baseline              Pertinent Vitals/Pain  Pain Assessment: Faces Pain Intervention(s): Monitored during session     Hand Dominance Right   Extremity/Trunk Assessment Upper Extremity Assessment RUE Deficits / Details: Moving arm spontaneously at lap level  (elbow to hand) but not using it for [place] RUE Sensation: WNL RUE Coordination: decreased gross motor           Communication Communication Communication: No difficulties   Cognition Arousal/Alertness: Awake/alert Behavior During Therapy: WFL for tasks assessed/performed Overall Cognitive Status: Within Functional Limits for tasks assessed                                                Home Living Family/patient expects to be discharged to:: Skilled nursing facility Living Arrangements: Alone Available Help at Discharge: Family;Available PRN/intermittently Type of Home: House Home Access: Stairs to enter Entrance Stairs-Number of Steps: 1   Home Layout: One level     Bathroom Shower/Tub: Tub/shower unit;Walk-in shower   Bathroom Toilet: Standard     Home Equipment: Environmental consultant - 4 wheels;Cane - single point;Shower seat   Additional Comments: Plans for to DC to SNF      Prior Functioning/Environment Level of Independence: Needs assistance  Gait / Transfers Assistance Needed: was ambulating at rehab with chair follow ADL's / Homemaking Assistance Needed: assist for bathing and dressing at rehab   Comments: uses cane for community mobility but not within the home.        OT Problem List: Decreased range of motion;Decreased strength;Impaired balance (sitting and/or standing);Pain;Impaired UE functional use;Obesity      OT Treatment/Interventions: Self-care/ADL training;DME and/or AE instruction;Patient/family education;Therapeutic exercise;Balance training    OT Goals(Current goals can be found in the care plan section) Acute Rehab OT Goals Patient Stated Goal: be able to do more for myself OT Goal Formulation: With patient Time For Goal Achievement: 11/29/18 Potential to Achieve Goals: Good  OT Frequency: Min 3X/week   Barriers to D/C: Decreased caregiver support          Co-evaluation PT/OT/SLP Co-Evaluation/Treatment: Yes Reason for  Co-Treatment: For patient/therapist safety PT goals addressed during session: Mobility/safety with mobility OT goals addressed during session: ADL's and self-care;Strengthening/ROM      AM-PAC OT "6 Clicks" Daily Activity     Outcome Measure Help from another person eating meals?: None Help from another person taking care of personal grooming?: A Little Help from another person toileting, which includes using toliet, bedpan, or urinal?: A Lot Help from another person bathing (including washing, rinsing, drying)?: A Lot Help from another person to put on and taking off regular upper body clothing?: A Lot Help from another person to put on and taking off regular lower body clothing?: A Lot 6 Click Score: 15   End of Session    Activity Tolerance: Patient tolerated treatment well Patient left: in chair;with call bell/phone within reach  OT Visit Diagnosis: Unsteadiness on feet (R26.81);Other abnormalities of gait and mobility (R26.89);Muscle weakness (generalized) (M62.81);Pain Pain - Right/Left: Right Pain - part of body: Shoulder                Time: 5053-9767 OT Time Calculation (min): 24 min Charges:  OT General Charges $OT Visit: 1 Visit OT Evaluation $OT Eval Moderate Complexity: [place], OTR/L Acute [employer] Pager [phone] Office [phone]     Almon Register 11/15/2018, 12:19 PM

## 2018-11-15 NOTE — Progress Notes (Signed)
PROGRESS NOTE    Melinda Kerr  JSH:[id number] DOB: 1935-06-20 DOA: 11/13/2018 PCP: Maury Dus, MD   Brief Narrative:  84 year old female with history of CHF, endometrial cancer, essential hypertension, morbid obesity recently underwent right proximal humerus fracture arthroplasty and was at rehab.  1 day prior to admission she developed hematemesis therefore brought to the hospital.  Denied any use of NSAIDs besides daily baby aspirin.  Has been on oxycodone and Ultram for her injuries.  Upon admission started on PPI drip and gastroenterology was consulted.  During admission received 2 units of PRBC.  Status post endoscopy on 2/17 which showed Lysbeth Galas lesion but no obvious bleeding noted.  Hemoglobin still remains low therefore received 2 more units of PRBC.   Assessment & Plan:   Principal Problem:   GIB (gastrointestinal bleeding) Active Problems:   Essential hypertension   Malignant neoplasm of uterus (HCC)   History of DVT (deep vein thrombosis)   S/P reverse total shoulder arthroplasty, right   Anemia associated with acute blood loss  Upper GI bleed, acute Acute blood loss anemia -Continue PPI twice daily at this time.  Hemoglobin has dropped this morning to 6.4.  We will give 2 more units of PRBC transfusion.  Continue to closely monitor. -Endoscopy 12/17 showed Lysbeth Galas lesion but no obvious bleeding -Diet as tolerated. -Continue to provide supportive care.  We will see if GI is any further input at this time.  Essential hypertension -Blood pressure medications on hold in the setting of acute GI bleed.  Will resume when appropriate  History of DVT -No longer anticoagulation  History of malignant uterine neoplasm -Follow-up outpatient  DVT prophylaxis: SCDs Code Status: Full code Family Communication: None at bedside Disposition Plan: Maintain hospitalization to closely monitor hemoglobin.  She requires 2 more units of PRBC today.  Consultants:    GI  Procedures:   Endoscopy 2/17  Antimicrobials:   None   Subjective: Although no further bleeding has noted her hemoglobin has slightly dropped.  She denies any complaints at this time.  Review of Systems Otherwise negative except as per HPI, including: General = no fevers, chills, dizziness, malaise, fatigue HEENT/EYES = negative for pain, redness, loss of vision, double vision, blurred vision, loss of hearing, sore throat, hoarseness, dysphagia Cardiovascular= negative for chest pain, palpitation, murmurs, lower extremity swelling Respiratory/lungs= negative for shortness of breath, cough, hemoptysis, wheezing, mucus production Gastrointestinal= negative for nausea, vomiting, abdominal pain,  hematemesis Genitourinary= negative for Dysuria, Hematuria, Change in Urinary Frequency MSK = Negative for arthralgia, myalgias, Back Pain, Joint swelling  Neurology= Negative for headache, seizures, numbness, tingling  Psychiatry= Negative for anxiety, depression, suicidal and homocidal ideation Allergy/Immunology= Medication/Food allergy as listed  Skin= Negative for Rash, lesions, ulcers, itching   Objective: Vitals:   11/15/18 0400 11/15/18 0500 11/15/18 1010 11/15/18 1030  BP: (!) 95/50 (!) 92/57 (!) 100/42 (!) 89/42  Pulse: 73 74 68 99  Resp: 20 (!) 23 16 (!) 25  Temp:   98.1 F (36.7 C) 98.2 F (36.8 C)  TempSrc:   Oral Oral  SpO2: 100% 100% 100% 100%  Weight:  119.6 kg    Height:        Intake/Output Summary (Last 24 hours) at 11/15/2018 1109 Last data filed at 11/15/2018 1010 Gross per 24 hour  Intake 3867.01 ml  Output 2885 ml  Net 982.01 ml   Filed Weights   11/14/18 0500 11/14/18 1150 11/15/18 0500  Weight: 115 kg 115 kg 119.6 kg  Examination:  Constitutional: NAD, calm, comfortable, on 2 L nasal cannula Eyes: PERRL, lids and conjunctivae normal ENMT: Mucous membranes are moist. Posterior pharynx clear of any exudate or lesions.Normal dentition.   Neck: normal, supple, no masses, no thyromegaly Respiratory: Slightly diminished breath sounds at the bases Cardiovascular: Regular rate and rhythm, no murmurs / rubs / gallops. No extremity edema. 2+ pedal pulses. No carotid bruits.  Abdomen: no tenderness, no masses palpated. No hepatosplenomegaly. Bowel sounds positive.  Musculoskeletal: no clubbing / cyanosis. No joint deformity upper and lower extremities. Good ROM, no contractures. Normal muscle tone.  Skin: no rashes, lesions, ulcers. No induration Neurologic: CN 2-12 grossly intact. Sensation intact, DTR normal. Strength 4/5 in all 4.  Psychiatric: Normal judgment and insight. Alert and oriented x 3. Normal mood.    Data Reviewed:   CBC: Recent Labs  Lab 11/13/18 1810 11/14/18 0355 11/15/18 0300  WBC 10.1 11.2* 10.2  NEUTROABS 8.4*  --   --   HGB 7.2* 7.4* 6.4*  HCT 24.0* 23.3* 20.3*  MCV 100.0 94.7 96.7  PLT 244 264 466   Basic Metabolic Panel: Recent Labs  Lab 11/13/18 1810 11/14/18 0355 11/15/18 0300  NA 135 136 137  K 5.6* 5.0 4.6  CL 100 106 109  CO2 27 26 23   GLUCOSE 163* 119* 95  BUN 60* 66* 34*  CREATININE 1.14* 0.91 0.85  CALCIUM 9.2 8.1* 8.3*  MG  --   --  2.0   GFR: Estimated Creatinine Clearance: 61.5 mL/min (by C-G formula based on SCr of 0.85 mg/dL). Liver Function Tests: Recent Labs  Lab 11/13/18 1810  AST 22  ALT 17  ALKPHOS 128*  BILITOT 0.8  PROT 5.2*  ALBUMIN 2.6*   No results for input(s): LIPASE, AMYLASE in the last 168 hours. No results for input(s): AMMONIA in the last 168 hours. Coagulation Profile: Recent Labs  Lab 11/13/18 1925  INR 1.23   Cardiac Enzymes: No results for input(s): CKTOTAL, CKMB, CKMBINDEX, TROPONINI in the last 168 hours. BNP (last 3 results) No results for input(s): PROBNP in the last 8760 hours. HbA1C: No results for input(s): HGBA1C in the last 72 hours. CBG: No results for input(s): GLUCAP in the last 168 hours. Lipid Profile: No results  for input(s): CHOL, HDL, LDLCALC, TRIG, CHOLHDL, LDLDIRECT in the last 72 hours. Thyroid Function Tests: No results for input(s): TSH, T4TOTAL, FREET4, T3FREE, THYROIDAB in the last 72 hours. Anemia Panel: Recent Labs    11/13/18 2013  VITAMINB12 618  FOLATE 17.5  FERRITIN 25  TIBC 283  IRON 46  RETICCTPCT 4.2*   Sepsis Labs: No results for input(s): PROCALCITON, LATICACIDVEN in the last 168 hours.  No results found for this or any previous visit (from the past 240 hour(s)).       Radiology Studies: No results found.      Scheduled Meds: . sodium chloride   Intravenous Once  . pantoprazole  40 mg Oral BID AC  . sodium chloride flush  3 mL Intravenous Q12H   Continuous Infusions: . sodium chloride    . sodium chloride 150 mL/hr at 11/15/18 0409     LOS: 2 days   Time spent= 40 mins    Ankit Arsenio Loader, MD [facility]  If 7PM-7AM, please contact night-coverage [email or web address] 11/15/2018, 11:09 AM

## 2018-11-15 NOTE — Plan of Care (Addendum)
Care plan has been reviewed.  Problem: Fluid Volume: BI bleeding. Goal: Will show no signs and symptoms of excessive bleeding Outcome: Progressing: no sign of hypovolemia, HB 8.5 at 19:02 after 2 unit of PRC given today.     Problem: Bowel/Gastric: Goal: Will show no signs and symptoms of gastrointestinal bleeding Outcome: Progressing: will follow up CBC at am again.   Problem: Clinical Measurements: on cardiac monitor, sinus rhythm with 1st degree AV Block Goal: Cardiovascular complication will be avoided Outcome: Progressing, BP remains stable tonight , good peripheral perfusion and lower extremities no signs of distress. However, BP was low 80s/50s mmHg at day shift today. Will continue to monitor.   Problem: Nutrition: started full liquid diet today. Goal: Adequate nutrition will be maintained Outcome: Progressing,her appetite  increased, no complaint about nausea and vomiting.   Problem: Pain Managment: previous history of falling with right Humerus fracture.  Goal: General experience of comfort will improve Outcome: Progressing, well pain management, rest well tonight.   Mia Creek, RN, [place]

## 2018-11-15 NOTE — Progress Notes (Signed)
Notified by lab of critical hgb 6.4. Pt asymptomatic. Dr. Hilbert Bible notified. Awaiting orders. Will continue to monitor.

## 2018-11-15 NOTE — Anesthesia Postprocedure Evaluation (Signed)
Anesthesia Post Note  Patient: Melinda Kerr  Procedure(s) Performed: ESOPHAGOGASTRODUODENOSCOPY (EGD) WITH PROPOFOL (N/A )     Patient location during evaluation: Endoscopy Anesthesia Type: MAC Level of consciousness: awake and alert Pain management: pain level controlled Vital Signs Assessment: post-procedure vital signs reviewed and stable Respiratory status: spontaneous breathing, nonlabored ventilation, respiratory function stable and patient connected to nasal cannula oxygen Cardiovascular status: blood pressure returned to baseline and stable Postop Assessment: no apparent nausea or vomiting Anesthetic complications: no    Last Vitals:  Vitals:   11/15/18 0400 11/15/18 0500  BP: (!) 95/50 (!) 92/57  Pulse: 73 74  Resp: 20 (!) 23  Temp:    SpO2: 100% 100%    Last Pain:  Vitals:   11/14/18 2351  TempSrc: Oral  PainSc:                  [place]

## 2018-11-15 NOTE — NC FL2 (Signed)
[place] LEVEL OF CARE SCREENING TOOL     IDENTIFICATION  Patient Name: Melinda Kerr Birthdate: Feb 19, 1935 Sex: female Admission Date (Current Location): 11/13/2018  [facility] and [place] Number:  Herbalist and Address:  [facility] [place]. [facility], [place] [address], [place], [place] [address]      Provider Number: [id number]  Attending Physician Name and Address:  Damita Lack, MD  Relative Name and Phone Number:  Kathrynn Humble [phone]    Current Level of Care: Hospital Recommended Level of Care: [place] Prior Approval Number:    Date Approved/Denied:   PASRR Number:  [id number]   Discharge Plan: SNF    Current Diagnoses: Patient Active Problem List   Diagnosis Date Noted  . GIB (gastrointestinal bleeding) 11/13/2018  . Anemia associated with acute blood loss 11/13/2018  . Closed fracture of right proximal humerus 10/26/2018  . S/P reverse total shoulder arthroplasty, right 10/26/2018  . Acute appendicitis   . Diverticulitis of large intestine with abscess 07/13/2017  . Colitis 07/13/2017  . Aortic atherosclerosis ([place]) 09/12/2016  . Coronary artery calcification 09/12/2016  . Hiatal hernia 09/12/2016  . Lumbar disc disease 09/12/2016  . Chemotherapy-induced thrombocytopenia 08/04/2016  . Chemotherapy induced neutropenia ([place]) 05/26/2016  . Port catheter in place 05/26/2016  . Encounter for antineoplastic chemotherapy 05/26/2016  . Urinary frequency 04/24/2016  . Obstructive sleep apnea on CPAP 04/24/2016  . History of DVT (deep vein thrombosis) 04/24/2016  . Endometrial cancer, FIGO stage IIIC ([place]) 04/24/2016  . Malignant neoplasm of endometrium ([place]) 03/26/2016  . Dyspnea on exertion 03/19/2016  . Malignant neoplasm of uterus ([place]) 03/19/2016  . Status post right knee replacement 04/16/2014  . Essential hypertension 04/16/2014  . Postoperative anemia due to acute blood loss 04/12/2014  . Status post  total left knee replacement 10/14/2013  . Thyroid activity decreased 10/14/2013  . UI (urinary incontinence) 10/14/2013  . Dyslipidemia 10/14/2013  . Constipation 10/14/2013  . Gout 10/14/2013  . Anemia 10/14/2013  . Allergic rhinitis 10/14/2013  . Insomnia 10/14/2013  . Depression 10/14/2013  . OA (osteoarthritis) of knee 10/09/2013  . OSA on CPAP 08/10/2013  . Morbid obesity with BMI of 45.0-49.9, adult (HCC)     Orientation RESPIRATION BLADDER Height & Weight     Self, Time, Situation, Place  Normal External catheter Weight: 263 lb 10.7 oz (119.6 kg) Height:  5' (152.4 cm)  BEHAVIORAL SYMPTOMS/MOOD NEUROLOGICAL BOWEL NUTRITION STATUS      Continent Diet(please see discharge summary )  AMBULATORY STATUS COMMUNICATION OF NEEDS Skin     Verbally Other (Comment)(incison(closed) shoulder; right,  non pressure wound flank right, left  yeat rash in left abd folds)                       Personal Care Assistance Level of Assistance  Bathing, Feeding, Dressing Bathing Assistance: Limited assistance Feeding assistance: Independent Dressing Assistance: Limited assistance     Functional Limitations Info  Sight, Hearing, Speech Sight Info: Adequate Hearing Info: Adequate Speech Info: Adequate    SPECIAL CARE FACTORS FREQUENCY  PT (By licensed PT), OT (By licensed OT)     PT Frequency: 3x per week  OT Frequency: 3x per week             Contractures Contractures Info: Not present    Additional Factors Info  Code Status, Allergies Code Status Info: FULL Allergies Info: Oxybutynin  Current Medications (11/15/2018):  This is the current hospital active medication list Current Facility-Administered Medications  Medication Dose Route Frequency Provider Last Rate Last Dose  . 0.9 %  sodium chloride infusion  250 mL Intravenous PRN Anson Fret F, MD      . ondansetron [facility]) tablet 4 mg  4 mg Oral Q6H PRN Wonda Horner, MD       Or  . ondansetron  (ZOFRAN) injection 4 mg  4 mg Intravenous Q6H PRN Anson Fret F, MD      . pantoprazole (PROTONIX) EC tablet 40 mg  40 mg Oral BID AC Amin, Ankit Chirag, MD   40 mg at 11/15/18 1258  . sodium chloride flush (NS) 0.9 % injection 3 mL  3 mL Intravenous Q12H Anson Fret F, MD   3 mL at 11/15/18 0900  . sodium chloride flush (NS) 0.9 % injection 3 mL  3 mL Intravenous PRN Wonda Horner, MD         Discharge Medications: Please see discharge summary for a list of discharge medications.  Relevant Imaging Results:  Relevant Lab Results:   Additional Information SSN [id number]  Vinie Sill, LCSWA

## 2018-11-15 NOTE — Clinical Social Work Note (Signed)
Clinical Social Work Assessment  Patient Details  Name: Melinda Kerr MRN: [id number] Date of Birth: [birth year]  Date of referral:  11/14/18               Reason for consult:  Facility Placement                Permission sought to share information with:  Facility Sport and exercise psychologist, Family Supports Permission granted to share information::  Yes, Verbal Permission Granted  Name::     Melinda Kerr   Agency::  SNFs  Relationship::  Niece  Contact Information:  [phone]  Housing/Transportation Living arrangements for the past 2 months:  Single Family Home Source of Information:  Patient Patient Interpreter Needed:  None Criminal Activity/Legal Involvement Pertinent to Current Situation/Hospitalization:  No - Comment as needed Significant Relationships:  Other Family Members Lives with:  Self Do you feel safe going back to the place where you live?  No Need for family participation in patient care:  Yes (Comment)  Care giving concerns:  CSW received consult for discharge needs. CSW spoke with patient regarding PT recommendation of SNF placement at time of discharge. Patient states she lives alone in a single level home. Patient states her support sytem is her niece, Melinda Kerr. Patient requested CSW inform her niece of the recommendations.   Social Worker assessment / plan:  CSW spoke with patient concerning possibility of rehab at [facility] before returning home.   Employment status:  Disabled (Comment on whether or not currently receiving Disability) Insurance information:  Medicare PT Recommendations:  [place] / Referral to community resources:  [place]  Patient/Family's Response to care: Patient recognizes need for rehab before returning home and is agreeable to a SNF placement. Patient reported she was at a SNF for rehab for about 2 weeks but wanted to view other possible choices. CSW explained possible co-pay status. CSW will provide SNF  listings for patient and her niece to review.     Patient/Family's Understanding of and Emotional Response to Diagnosis, Current Treatment, and Prognosis:  Patient/family is realistic regarding therapy needs and expressed being hopeful for SNF placement. Patient expressed understanding of CSW role and discharge process as well as medical condition. No questions/concerns about plan or treatment at this time.   Emotional Assessment Appearance:  Appears stated age Attitude/Demeanor/Rapport:  Engaged Affect (typically observed):  Appropriate, Accepting, Pleasant Orientation:  Oriented to Self, Oriented to Place, Oriented to  Time, Oriented to Situation Alcohol / Substance use:  Not Applicable Psych involvement (Current and /or in the community):  No (Comment)  Discharge Needs  Concerns to be addressed:  Care Coordination Readmission within the last 30 days:  Yes Current discharge risk:  Dependent with Mobility Barriers to Discharge:  Continued Medical Work up   [employer], [place] 11/15/2018, 9:52 AM

## 2018-11-15 NOTE — Progress Notes (Signed)
[facility] Progress Note  Subjective: No further signs of active bleeding. Getting two units of PRCs today.  Objective: Vital signs in last 24 hours: Temp:  [97.6 F (36.4 C)-98.4 F (36.9 C)] 98 F (36.7 C) (02/18 1327) Pulse Rate:  [66-99] 66 (02/18 1327) Resp:  [15-25] 20 (02/18 1327) BP: (78-100)/(41-65) 86/62 (02/18 1327) SpO2:  [93 %-100 %] 96 % (02/18 1327) Weight:  [119.6 kg] 119.6 kg (02/18 0500) Weight change: -0.1 kg   PE:  no distress Abdomen soft  Lab Results: Results for orders placed or performed during the hospital encounter of 11/13/18 (from the past 24 hour(s))  Basic metabolic panel     Status: Abnormal   Collection Time: 11/15/18  3:00 AM  Result Value Ref Range   Sodium 137 135 - 145 mmol/L   Potassium 4.6 3.5 - 5.1 mmol/L   Chloride 109 98 - 111 mmol/L   CO2 23 22 - 32 mmol/L   Glucose, Bld 95 70 - 99 mg/dL   BUN 34 (H) 8 - 23 mg/dL   Creatinine, Ser 0.85 0.44 - 1.00 mg/dL   Calcium 8.3 (L) 8.9 - 10.3 mg/dL   GFR calc non Af Amer >60 >60 mL/min   GFR calc Af Amer >60 >60 mL/min   Anion gap 5 5 - 15  CBC     Status: Abnormal   Collection Time: 11/15/18  3:00 AM  Result Value Ref Range   WBC 10.2 4.0 - 10.5 K/uL   RBC 2.10 (L) 3.87 - 5.11 MIL/uL   Hemoglobin 6.4 (LL) 12.0 - 15.0 g/dL   HCT 20.3 (L) 36.0 - 46.0 %   MCV 96.7 80.0 - 100.0 fL   MCH 30.5 26.0 - 34.0 pg   MCHC 31.5 30.0 - 36.0 g/dL   RDW 17.0 (H) 11.5 - 15.5 %   Platelets 198 150 - 400 K/uL   nRBC 0.2 0.0 - 0.2 %  Magnesium     Status: None   Collection Time: 11/15/18  3:00 AM  Result Value Ref Range   Magnesium 2.0 1.7 - 2.4 mg/dL  Prepare RBC     Status: None   Collection Time: 11/15/18  7:56 AM  Result Value Ref Range   Order Confirmation      ORDER PROCESSED BY BLOOD BANK BB SAMPLE OR UNITS ALREADY AVAILABLE Performed at [facility], [address] [place], [place] [address]     Studies/Results: No results found.    Assessment: GI bleed from  [facility] ulcers on rim of hiatal hernia  Plan:   Continue PPIs Advance to full liquids.    SAM F Sherleen Pangborn 11/15/2018, 2:01 PM  Pager: [phone] If no answer or after 5 PM call [phone]

## 2018-11-15 NOTE — Evaluation (Signed)
Physical Therapy Evaluation Patient Details Name: Melinda Kerr MRN: [id number] DOB: [birth year] Today's Date: 11/15/2018   History of Present Illness  84 year old female with history of CHF, endometrial cancer, essential hypertension, morbid obesity recently underwent right proximal humerus fracture arthroplasty and was at rehab.  1 day prior to admission she developed hematemesis therefore brought to the hospital. Patient was at [facility] for rehabilitation 3 weeks s/p total shoulder arthroplasty RUE after having fell and sustained fracture.   Clinical Impression  Orders received for PT evaluation. Patient demonstrates deficits in functional mobility as indicated below. Will benefit from continued skilled PT to address deficits and maximize function. Will see as indicated and progress as tolerated.  Patient will need continued post acute rehabilitation for shoulder rehab from previous admission.   OF NOTE: patient with low HgB this am, however, asymptomatic throughout session. Patient requesting use of BSC, performed under direction of PT/OT team and assist OOB to chair with orders for activity with assist. VSS throughout session, patient now on 2 liters [place]    Follow Up Recommendations SNF    Equipment Recommendations  Other (comment)(TBD at next venue)    Recommendations for Other Services       Precautions / Restrictions Precautions Precautions: Fall;Shoulder Type of Shoulder Precautions: pt states can use arm for ADLs has not progressed to elevation Restrictions RUE Weight Bearing: Non weight bearing      Mobility  Bed Mobility Overal bed mobility: Needs Assistance Bed Mobility: Sit to Supine     Supine to sit: Mod assist;HOB elevated     General bed mobility comments: moderate assist to elevate trunk to upright at EOB, assist to scoot to EOB. Increased time and effort.   Transfers Overall transfer level: Needs assistance Equipment used: 1 person hand held  assist Transfers: Sit to/from [facility] Sit to Stand: Min assist Stand pivot transfers: Min assist       General transfer comment: min assist with UE supported on BSC during transition, increased effort. DId not elevate to full extension during transition  Ambulation/Gait             General Gait Details: deferred at this time  Stairs            Wheelchair Mobility    Modified Rankin (Stroke Patients Only)       Balance Overall balance assessment: Needs assistance Sitting-balance support: No upper extremity supported;Feet supported Sitting balance-Leahy Scale: Good     Standing balance support: Single extremity supported;During functional activity Standing balance-Leahy Scale: Poor Standing balance comment: reliant on external support                             Pertinent Vitals/Pain Pain Assessment: Faces Faces Pain Scale: Hurts little more Pain Location: r shoulder Pain Descriptors / Indicators: Sore Pain Intervention(s): Monitored during session    Home Living Family/patient expects to be discharged to:: Skilled nursing facility Living Arrangements: Alone Available Help at Discharge: Family;Available PRN/intermittently Type of Home: House Home Access: Stairs to enter   Entrance Stairs-Number of Steps: 1 Home Layout: One level Home Equipment: Walker - 4 wheels;Cane - single point;Shower seat Additional Comments: Plans for to DC to SNF    Prior Function Level of Independence: Needs assistance   Gait / Transfers Assistance Needed: was ambulating at rehab with chair follow  ADL's / Homemaking Assistance Needed: assist for bathing and dressing at rehab  Comments: uses cane for community  mobility but not within the home.     Hand Dominance   Dominant Hand: Right    Extremity/Trunk Assessment   Upper Extremity Assessment Upper Extremity Assessment: RUE deficits/detail RUE Deficits / Details: deficits as  anticipated post-op, block worn off now RUE Sensation: WNL RUE Coordination: decreased gross motor    Lower Extremity Assessment Lower Extremity Assessment: Generalized weakness    Cervical / Trunk Assessment Cervical / Trunk Assessment: Normal  Communication   Communication: No difficulties  Cognition Arousal/Alertness: Awake/alert Behavior During Therapy: WFL for tasks assessed/performed Overall Cognitive Status: Within Functional Limits for tasks assessed                                        General Comments      Exercises     Assessment/Plan    PT Assessment Patient needs continued PT services  PT Problem List Decreased balance;Decreased mobility;Obesity;Cardiopulmonary status limiting activity;Decreased activity tolerance       PT Treatment Interventions DME instruction;Functional mobility training;Balance training;Patient/family education;Gait training;Therapeutic activities;Therapeutic exercise    PT Goals (Current goals can be found in the Care Plan section)  Acute Rehab PT Goals Patient Stated Goal: independence PT Goal Formulation: With patient Time For Goal Achievement: 11/29/18 Potential to Achieve Goals: Good    Frequency Min 2X/week   Barriers to discharge Decreased caregiver support      Co-evaluation PT/OT/SLP Co-Evaluation/Treatment: Yes Reason for Co-Treatment: For patient/therapist safety PT goals addressed during session: Mobility/safety with mobility OT goals addressed during session: ADL's and self-care       AM-PAC PT "6 Clicks" Mobility  Outcome Measure Help needed turning from your back to your side while in a flat bed without using bedrails?: A Lot Help needed moving from lying on your back to sitting on the side of a flat bed without using bedrails?: A Lot Help needed moving to and from a bed to a chair (including a wheelchair)?: A Little Help needed standing up from a chair using your arms (e.g., wheelchair or  bedside chair)?: A Little Help needed to walk in hospital room?: A Little Help needed climbing 3-5 steps with a railing? : A Lot 6 Click Score: 15    End of Session Equipment Utilized During Treatment: Gait belt Activity Tolerance: Patient tolerated treatment well Patient left: in chair;with call bell/phone within reach Nurse Communication: Mobility status PT Visit Diagnosis: Other abnormalities of gait and mobility (R26.89);Difficulty in walking, not elsewhere classified (R26.2)    Time: 5852-7782 PT Time Calculation (min) (ACUTE ONLY): 24 min   Charges:   PT Evaluation $PT Eval Moderate Complexity: 1 Mod          Alben Deeds, PT DPT  Board Certified Neurologic Specialist Acute Rehabilitation Services Pager [phone] Office [phone]   Duncan Dull 11/15/2018, 8:51 AM

## 2018-11-16 LAB — TYPE AND SCREEN
ABO/RH(D): A NEG
Antibody Screen: NEGATIVE
Unit division: 0
Unit division: 0
Unit division: 0
Unit division: 0

## 2018-11-16 LAB — BPAM RBC
BLOOD PRODUCT EXPIRATION DATE: 202002272359
Blood Product Expiration Date: 202002292359
Blood Product Expiration Date: 202003022359
Blood Product Expiration Date: 202003022359
ISSUE DATE / TIME: 202002162333
ISSUE DATE / TIME: 202002181003
ISSUE DATE / TIME: 202002181353
ISSUE DATE / TIME: 202002162029
Unit Type and Rh: 600
Unit Type and Rh: 600
Unit Type and Rh: 600
Unit Type and Rh: 600

## 2018-11-16 LAB — BASIC METABOLIC PANEL
ANION GAP: 3 — AB (ref 5–15)
BUN: 15 mg/dL (ref 8–23)
CO2: 28 mmol/L (ref 22–32)
Calcium: 8.9 mg/dL (ref 8.9–10.3)
Chloride: 106 mmol/L (ref 98–111)
Creatinine, Ser: 0.77 mg/dL (ref 0.44–1.00)
GFR calc Af Amer: >60 mL/min (ref >60)
GFR calc non Af Amer: >60 mL/min (ref >60)
Glucose, Bld: 108 mg/dL — ABNORMAL HIGH (ref 70–99)
POTASSIUM: 4.7 mmol/L (ref 3.5–5.1)
Sodium: 137 mmol/L (ref 135–145)

## 2018-11-16 LAB — CBC
HCT: 26.4 % — ABNORMAL LOW (ref 36.0–46.0)
Hemoglobin: 8.5 g/dL — ABNORMAL LOW (ref 12.0–15.0)
MCH: 30.1 pg (ref 26.0–34.0)
MCHC: 32.2 g/dL (ref 30.0–36.0)
MCV: 93.6 fL (ref 80.0–100.0)
Platelets: 177 10*3/uL (ref 150–400)
RBC: 2.82 MIL/uL — ABNORMAL LOW (ref 3.87–5.11)
RDW: 17.5 % — ABNORMAL HIGH (ref 11.5–15.5)
WBC: 7.8 10*3/uL (ref 4.0–10.5)
nRBC: 0 % (ref 0.0–0.2)

## 2018-11-16 LAB — MAGNESIUM: Magnesium: 1.8 mg/dL (ref 1.7–2.4)

## 2018-11-16 MED ORDER — NON FORMULARY: 3.0000 mg | Freq: Every evening | Status: DC | PRN

## 2018-11-16 MED ORDER — MELATONIN 3 MG PO TABS
3.0000 mg | ORAL_TABLET | Freq: Every evening | ORAL | Status: DC | PRN
  Filled 2018-11-16: qty 1

## 2018-11-16 NOTE — Progress Notes (Signed)
PROGRESS NOTE  Melinda Kerr TIR:[id number] DOB: 05-30-1935 DOA: 11/13/2018 PCP: Maury Dus, MD   LOS: 3 days   Brief Narrative / Interim history: 84 year old female with history of CHF, endometrial cancer, essential hypertension, morbid obesity recently underwent right proximal humerus fracture arthroplasty and was at rehab.  1 day prior to admission she developed hematemesis therefore brought to the hospital.  Denied any use of NSAIDs besides daily baby aspirin.  Has been on oxycodone and Ultram for her injuries.  Upon admission started on PPI drip and gastroenterology was consulted.  During admission received 2 units of PRBC.  Status post endoscopy on 2/17 which showed Lysbeth Galas lesion but no obvious bleeding noted.  Hemoglobin still remains low therefore received 2 more units of PRBC.  Subjective: -feeling better this morning, denies chest pain or shortness of breath.  No abdominal pain, nausea or vomiting.  Assessment & Plan: Principal Problem:   GIB (gastrointestinal bleeding) Active Problems:   Essential hypertension   Malignant neoplasm of uterus (HCC)   History of DVT (deep vein thrombosis)   S/P reverse total shoulder arthroplasty, right   Anemia associated with acute blood loss   Principal Problem Acute blood loss anemia due to upper GI bleed -Continue PPI, patient was transfused a total of 4 units of packed red blood cells, hemoglobin is improved to 8.5 and has remained stable since yesterday -Currently on full liquid diet, advance to soft, continue to monitor CBC -GI to see, probable discharge on Thursday if hemoglobin remains stable  Active Problems Hypertension -Hold home BP meds in the setting of acute GI bleed, resume likely on discharge  History of DVT -No longer on anticoagulation  History of malignant uterine neoplasm -Outpatient follow-up  Status post total shoulder arthroplasty, right -addressed during her prior hospital stay, she went to an SNF.  She  will be discharged to SNF however she requests a different location.  Social worker involved  Scheduled Meds: . pantoprazole  40 mg Oral BID AC  . sodium chloride flush  3 mL Intravenous Q12H   Continuous Infusions: . sodium chloride     PRN Meds:.sodium chloride, ondansetron **OR** ondansetron (ZOFRAN) IV, sodium chloride flush, traMADol, zolpidem  DVT prophylaxis: SCDs Code Status: Full code Family Communication: no family at bedside  Disposition Plan: home when ready   Consultants:   GI  Procedures:   EGD  Antimicrobials:  None    Objective: Vitals:   11/16/18 0422 11/16/18 0425 11/16/18 0754 11/16/18 1154  BP: 135/65  (!) 121/56 128/78  Pulse: 70 69 66 82  Resp:      Temp: 98 F (36.7 C)  98.1 F (36.7 C) 98.1 F (36.7 C)  TempSrc: Oral  Oral Oral  SpO2: 100% 100% 100%   Weight:  116.9 kg    Height:  5' (1.524 m)      Intake/Output Summary (Last 24 hours) at 11/16/2018 1411 Last data filed at 11/16/2018 1152 Gross per 24 hour  Intake 770 ml  Output 3150 ml  Net -2380 ml   Filed Weights   11/14/18 1150 11/15/18 0500 11/16/18 0425  Weight: 115 kg 119.6 kg 116.9 kg    Examination:  Constitutional: NAD Eyes:, lids and conjunctivae normal ENMT: Mucous membranes are moist.  Neck: normal, supple Respiratory: clear to auscultation bilaterally, no wheezing, no crackles. Normal respiratory effort.  Cardiovascular: Regular rate and rhythm, no murmurs / rubs / gallops. No LE edema.  Abdomen: no tenderness. Bowel sounds positive.  Musculoskeletal: no clubbing /  cyanosis. Skin: no rashes Neurologic: no focal deficits   Data Reviewed: I have independently reviewed following labs and imaging studies   CBC: Recent Labs  Lab 11/13/18 1810 11/14/18 0355 11/15/18 0300 11/15/18 1902 11/16/18 0243  WBC 10.1 11.2* 10.2 8.8 7.8  NEUTROABS 8.4*  --   --   --   --   HGB 7.2* 7.4* 6.4* 8.5* 8.5*  HCT 24.0* 23.3* 20.3* 27.3* 26.4*  MCV 100.0 94.7 96.7 93.5  93.6  PLT 244 264 198 201 631   Basic Metabolic Panel: Recent Labs  Lab 11/13/18 1810 11/14/18 0355 11/15/18 0300 11/16/18 0243  NA 135 136 137 137  K 5.6* 5.0 4.6 4.7  CL 100 106 109 106  CO2 27 26 23 28   GLUCOSE 163* 119* 95 108*  BUN 60* 66* 34* 15  CREATININE 1.14* 0.91 0.85 0.77  CALCIUM 9.2 8.1* 8.3* 8.9  MG  --   --  2.0 1.8   GFR: Estimated Creatinine Clearance: 64.5 mL/min (by C-G formula based on SCr of 0.77 mg/dL). Liver Function Tests: Recent Labs  Lab 11/13/18 1810  AST 22  ALT 17  ALKPHOS 128*  BILITOT 0.8  PROT 5.2*  ALBUMIN 2.6*   No results for input(s): LIPASE, AMYLASE in the last 168 hours. No results for input(s): AMMONIA in the last 168 hours. Coagulation Profile: Recent Labs  Lab 11/13/18 1925  INR 1.23   Cardiac Enzymes: No results for input(s): CKTOTAL, CKMB, CKMBINDEX, TROPONINI in the last 168 hours. BNP (last 3 results) No results for input(s): PROBNP in the last 8760 hours. HbA1C: No results for input(s): HGBA1C in the last 72 hours. CBG: No results for input(s): GLUCAP in the last 168 hours. Lipid Profile: No results for input(s): CHOL, HDL, LDLCALC, TRIG, CHOLHDL, LDLDIRECT in the last 72 hours. Thyroid Function Tests: No results for input(s): TSH, T4TOTAL, FREET4, T3FREE, THYROIDAB in the last 72 hours. Anemia Panel: Recent Labs    11/13/18 2013  VITAMINB12 618  FOLATE 17.5  FERRITIN 25  TIBC 283  IRON 46  RETICCTPCT 4.2*   Urine analysis:    Component Value Date/Time   COLORURINE STRAW (A) 07/13/2017 1858   APPEARANCEUR CLEAR 07/13/2017 1858   LABSPEC 1.042 (H) 07/13/2017 1858   LABSPEC 1.020 05/04/2016 1006   PHURINE 6.0 07/13/2017 1858   GLUCOSEU NEGATIVE 07/13/2017 1858   GLUCOSEU Negative 05/04/2016 1006   HGBUR SMALL (A) 07/13/2017 1858   BILIRUBINUR NEGATIVE 07/13/2017 1858   BILIRUBINUR Negative 05/04/2016 1006   KETONESUR NEGATIVE 07/13/2017 1858   PROTEINUR NEGATIVE 07/13/2017 1858   UROBILINOGEN  0.2 05/04/2016 1006   NITRITE NEGATIVE 07/13/2017 1858   LEUKOCYTESUR NEGATIVE 07/13/2017 1858   LEUKOCYTESUR Small 05/04/2016 1006   Sepsis Labs: Invalid input(s): PROCALCITONIN, LACTICIDVEN  No results found for this or any previous visit (from the past 240 hour(s)).    Radiology Studies: No results found.   Marzetta Board, MD, PhD [facility]  Contact via  [email or web address]  [place] P: [phone]  F: [phone]

## 2018-11-16 NOTE — Progress Notes (Signed)
Occupational Therapy Treatment Patient Details Name: Melinda Kerr MRN: [id number] DOB: October 05, 1934 Today's Date: 11/16/2018    History of present illness 84 year old female with history of CHF, endometrial cancer, essential hypertension, morbid obesity recently underwent right proximal humerus fracture arthroplasty and was at rehab.  1 day prior to admission she developed hematemesis therefore brought to the hospital. Patient was at [facility] for rehabilitation 3 weeks s/p total shoulder arthroplasty RUE after having fell and sustained fracture.    OT comments  Pt education and performing RUE HEP for PROM shoulder movements listed below and AROM to elbow, wrist and hand. Pt continues to have some discomfort in RUE. Pt performing toilet hygiene with totalA and pt requiring assist with mobility in room hand held assist with gait belt as pt leans forward for mobility. Pt continues to require increased assist for ADL and mobility, unsafe to go home alone. Pt would benefit from continued OT skilled services for ADL, mobility and safety in SNF setting.    Follow Up Recommendations  SNF;Supervision/Assistance - 24 hour    Equipment Recommendations  Other (comment)(to be determined at next venue)    Recommendations for Other Services      Precautions / Restrictions Precautions Precautions: Fall;Shoulder Type of Shoulder Precautions: pt states can use arm for ADLs  and no sling needed now but has not progressed to elevation; was OK for pendulums at last admission as well as PROM 30 (ER), 60 (Abd) and 90 (FF) Shoulder Interventions: (no sling found) Precaution Booklet Issued: Yes (comment) Precaution Comments: reviewed shoulder dc handout in full and reviewed HEP (included PROM this session) Restrictions Weight Bearing Restrictions: Yes RUE Weight Bearing: Partial weight bearing RUE Partial Weight Bearing Percentage or Pounds: 50       Mobility Bed Mobility Overal bed mobility: Needs  Assistance Bed Mobility: Sit to Supine     Supine to sit: Mod assist Sit to supine: Mod assist   General bed mobility comments: moderate assist to elevate trunk to upright at EOB, assist to scoot to EOB. Increased time and effort.   Transfers Overall transfer level: Needs assistance Equipment used: 1 person hand held assist Transfers: Sit to/from Stand Sit to Stand: Min assist         General transfer comment: Pt minA for transfers stooped over and unable to tolerate standing upright    Balance Overall balance assessment: Needs assistance Sitting-balance support: No upper extremity supported;Feet supported Sitting balance-Leahy Scale: Good       Standing balance-Leahy Scale: Poor Standing balance comment: reliant on external support                           ADL either performed or assessed with clinical judgement   ADL Overall ADL's : Needs assistance/impaired                         Toilet Transfer: Minimal assistance;Comfort height toilet;Grab bars   Toileting- Clothing Manipulation and Hygiene: Total assistance       Functional mobility during ADLs: Minimal assistance(handheld) General ADL Comments: Pt educated and performed UE shoulder exercises to improve ROM of RUE. Pt continued to perform elbow, wrist and hand exercises upon leaving       Vision   Vision Assessment?: No apparent visual deficits   Perception     Praxis      Cognition Arousal/Alertness: Awake/alert Behavior During Therapy: WFL for tasks assessed/performed Overall Cognitive Status:  Within Functional Limits for tasks assessed                                          Exercises Exercises: Shoulder Shoulder Exercises Shoulder Flexion: PROM;Right;10 reps;Seated(to 90*) Shoulder ABduction: PROM;Right;Seated;10 reps(*60) Shoulder External Rotation: PROM;Right;Seated;10 reps(30*) Elbow Flexion: AROM;Right;10 reps;Seated Elbow Extension: AROM;10  reps;Right Wrist Flexion: AROM;Right;10 reps;Seated Wrist Extension: AROM;Right;10 reps;Seated Digit Composite Flexion: AROM;Right;10 reps;Seated Composite Extension: AROM;Right;10 reps;Seated   Shoulder Instructions       General Comments      Pertinent Vitals/ Pain       Pain Assessment: 0-10 Pain Score: 5  Pain Location: r shoulder Pain Descriptors / Indicators: Sore Pain Intervention(s): Limited activity within patient's tolerance;Repositioned  Home Living                                          Prior Functioning/Environment              Frequency  Min 3X/week        Progress Toward Goals  OT Goals(current goals can now be found in the care plan section)  Progress towards OT goals: Progressing toward goals  Acute Rehab OT Goals Patient Stated Goal: be able to do more for myself OT Goal Formulation: With patient Time For Goal Achievement: 11/29/18 Potential to Achieve Goals: Good ADL Goals Pt Will Perform Eating: sitting;with modified independence Pt Will Perform Grooming: with set-up;with supervision;sitting Pt Will Perform Upper Body Bathing: with min assist;sitting Pt Will Perform Lower Body Bathing: with min assist Pt Will Perform Upper Body Dressing: with modified independence;sitting Pt Will Transfer to Toilet: with min guard assist;ambulating;bedside commode Pt Will Perform Toileting - Clothing Manipulation and hygiene: with min assist Pt/caregiver will Perform Home Exercise Program: Increased ROM;Right Upper extremity;With minimal assist Additional ADL Goal #1: Pt will be S in and OOB for basic ADLs  Plan Discharge plan remains appropriate;Frequency remains appropriate    Co-evaluation                 AM-PAC OT "6 Clicks" Daily Activity     Outcome Measure   Help from another person eating meals?: None Help from another person taking care of personal grooming?: A Little Help from another person toileting, which  includes using toliet, bedpan, or urinal?: A Little Help from another person bathing (including washing, rinsing, drying)?: A Lot Help from another person to put on and taking off regular upper body clothing?: A Lot Help from another person to put on and taking off regular lower body clothing?: A Lot 6 Click Score: 16    End of Session Equipment Utilized During Treatment: Gait belt  OT Visit Diagnosis: Unsteadiness on feet (R26.81);Other abnormalities of gait and mobility (R26.89);Muscle weakness (generalized) (M62.81);Pain Pain - Right/Left: Right Pain - part of body: Shoulder   Activity Tolerance Patient tolerated treatment well   Patient Left in chair;with call bell/phone within reach   Nurse Communication Mobility status        Time: 4268-3419 OT Time Calculation (min): 32 min  Charges: OT General Charges $OT Visit: 1 Visit OT Treatments $Self Care/Home Management : 8-22 mins $Therapeutic Exercise: 8-22 mins  Darryl Nestle) Marsa Aris OTR/L Acute Rehabilitation Services Pager: [phone] Office: [phone]    Fredda Hammed 11/16/2018, 1:06 PM

## 2018-11-16 NOTE — Care Management Important Message (Signed)
Important Message  Patient Details  Name: Melinda Kerr MRN: [id number] Date of Birth: May 26, 1935   Medicare Important Message Given:  Yes    Leanard Dimaio P [place] 11/16/2018, 12:24 PM

## 2018-11-16 NOTE — Progress Notes (Signed)
Patient's family has accepted bed offer with [facility]- Patient will discharge to [facility] once she is medically stable.  Thurmond Butts, [place] Social Worker [phone]

## 2018-11-17 DIAGNOSIS — R41841 Cognitive communication deficit: Secondary | ICD-10-CM | POA: Diagnosis not present

## 2018-11-17 DIAGNOSIS — I5020 Unspecified systolic (congestive) heart failure: Secondary | ICD-10-CM | POA: Diagnosis not present

## 2018-11-17 DIAGNOSIS — M109 Gout, unspecified: Secondary | ICD-10-CM | POA: Diagnosis not present

## 2018-11-17 DIAGNOSIS — R279 Unspecified lack of coordination: Secondary | ICD-10-CM | POA: Diagnosis not present

## 2018-11-17 DIAGNOSIS — G4733 Obstructive sleep apnea (adult) (pediatric): Secondary | ICD-10-CM | POA: Diagnosis not present

## 2018-11-17 DIAGNOSIS — K5900 Constipation, unspecified: Secondary | ICD-10-CM | POA: Diagnosis not present

## 2018-11-17 DIAGNOSIS — K219 Gastro-esophageal reflux disease without esophagitis: Secondary | ICD-10-CM | POA: Diagnosis not present

## 2018-11-17 DIAGNOSIS — C55 Malignant neoplasm of uterus, part unspecified: Secondary | ICD-10-CM | POA: Diagnosis not present

## 2018-11-17 DIAGNOSIS — D649 Anemia, unspecified: Secondary | ICD-10-CM | POA: Diagnosis not present

## 2018-11-17 DIAGNOSIS — E785 Hyperlipidemia, unspecified: Secondary | ICD-10-CM | POA: Diagnosis not present

## 2018-11-17 DIAGNOSIS — R2681 Unsteadiness on feet: Secondary | ICD-10-CM | POA: Diagnosis not present

## 2018-11-17 DIAGNOSIS — R52 Pain, unspecified: Secondary | ICD-10-CM | POA: Diagnosis not present

## 2018-11-17 DIAGNOSIS — I10 Essential (primary) hypertension: Secondary | ICD-10-CM | POA: Diagnosis not present

## 2018-11-17 DIAGNOSIS — R112 Nausea with vomiting, unspecified: Secondary | ICD-10-CM | POA: Diagnosis not present

## 2018-11-17 DIAGNOSIS — Z86718 Personal history of other venous thrombosis and embolism: Secondary | ICD-10-CM | POA: Diagnosis not present

## 2018-11-17 DIAGNOSIS — M6281 Muscle weakness (generalized): Secondary | ICD-10-CM | POA: Diagnosis not present

## 2018-11-17 DIAGNOSIS — R58 Hemorrhage, not elsewhere classified: Secondary | ICD-10-CM | POA: Diagnosis not present

## 2018-11-17 DIAGNOSIS — C541 Malignant neoplasm of endometrium: Secondary | ICD-10-CM | POA: Diagnosis not present

## 2018-11-17 DIAGNOSIS — R350 Frequency of micturition: Secondary | ICD-10-CM | POA: Diagnosis not present

## 2018-11-17 DIAGNOSIS — K922 Gastrointestinal hemorrhage, unspecified: Secondary | ICD-10-CM | POA: Diagnosis not present

## 2018-11-17 DIAGNOSIS — G4700 Insomnia, unspecified: Secondary | ICD-10-CM | POA: Diagnosis not present

## 2018-11-17 DIAGNOSIS — M810 Age-related osteoporosis without current pathological fracture: Secondary | ICD-10-CM | POA: Diagnosis not present

## 2018-11-17 DIAGNOSIS — I700 Atherosclerosis of aorta: Secondary | ICD-10-CM | POA: Diagnosis not present

## 2018-11-17 DIAGNOSIS — M25569 Pain in unspecified knee: Secondary | ICD-10-CM | POA: Diagnosis not present

## 2018-11-17 DIAGNOSIS — F329 Major depressive disorder, single episode, unspecified: Secondary | ICD-10-CM | POA: Diagnosis not present

## 2018-11-17 DIAGNOSIS — D62 Acute posthemorrhagic anemia: Secondary | ICD-10-CM | POA: Diagnosis not present

## 2018-11-17 DIAGNOSIS — E039 Hypothyroidism, unspecified: Secondary | ICD-10-CM | POA: Diagnosis not present

## 2018-11-17 DIAGNOSIS — R278 Other lack of coordination: Secondary | ICD-10-CM | POA: Diagnosis not present

## 2018-11-17 DIAGNOSIS — Z743 Need for continuous supervision: Secondary | ICD-10-CM | POA: Diagnosis not present

## 2018-11-17 LAB — BASIC METABOLIC PANEL
Anion gap: 8 (ref 5–15)
BUN: 10 mg/dL (ref 8–23)
CO2: 26 mmol/L (ref 22–32)
Calcium: 9.2 mg/dL (ref 8.9–10.3)
Chloride: 102 mmol/L (ref 98–111)
Creatinine, Ser: 0.7 mg/dL (ref 0.44–1.00)
GFR calc Af Amer: >60 mL/min (ref >60)
Glucose, Bld: 118 mg/dL — ABNORMAL HIGH (ref 70–99)
Potassium: 4.1 mmol/L (ref 3.5–5.1)
Sodium: 136 mmol/L (ref 135–145)

## 2018-11-17 LAB — CBC
HCT: 28.9 % — ABNORMAL LOW (ref 36.0–46.0)
HEMOGLOBIN: 8.9 g/dL — AB (ref 12.0–15.0)
MCH: 29.2 pg (ref 26.0–34.0)
MCHC: 30.8 g/dL (ref 30.0–36.0)
MCV: 94.8 fL (ref 80.0–100.0)
Platelets: 205 10*3/uL (ref 150–400)
RBC: 3.05 MIL/uL — ABNORMAL LOW (ref 3.87–5.11)
RDW: 16.9 % — ABNORMAL HIGH (ref 11.5–15.5)
WBC: 7.7 10*3/uL (ref 4.0–10.5)
nRBC: 0 % (ref 0.0–0.2)

## 2018-11-17 LAB — MAGNESIUM: Magnesium: 1.6 mg/dL — ABNORMAL LOW (ref 1.7–2.4)

## 2018-11-17 MED ORDER — TRAMADOL HCL 50 MG PO TABS: 50.0000 mg | ORAL_TABLET | Freq: Four times a day (QID) | ORAL | 0 refills | Status: DC | PRN

## 2018-11-17 MED ORDER — OXYCODONE-ACETAMINOPHEN 5-325 MG PO TABS: 1.0000 | ORAL_TABLET | ORAL | 0 refills | Status: DC | PRN

## 2018-11-17 MED ORDER — PANTOPRAZOLE SODIUM 40 MG PO TBEC: 40.0000 mg | DELAYED_RELEASE_TABLET | Freq: Two times a day (BID) | ORAL | 1 refills | Status: DC

## 2018-11-17 NOTE — Progress Notes (Signed)
Patient left unit via PTAR at this time

## 2018-11-17 NOTE — Clinical Social Work Placement (Signed)
   CLINICAL SOCIAL WORK PLACEMENT  NOTE  Date:  11/17/2018  Patient Details  Name: Melinda Kerr MRN: [id number] Date of Birth: [birth year]  Clinical Social Work is seeking post-discharge placement for this patient at the [place] level of care (*CSW will initial, date and re-position this form in  chart as items are completed):  Yes   Patient/family provided with [facility] Work Department's list of facilities offering this level of care within the geographic area requested by the patient (or if unable, by the patient's family).  Yes   Patient/family informed of their freedom to choose among providers that offer the needed level of care, that participate in Medicare, Medicaid or managed care program needed by the patient, have an available bed and are willing to accept the patient.  Yes   Patient/family informed of [facility]'s ownership interest in [facility] and [facility], as well as of the fact that they are under no obligation to receive care at these facilities.  PASRR submitted to EDS on       PASRR number received on 11/15/18     Existing PASRR number confirmed on       FL2 transmitted to all facilities in geographic area requested by pt/family on       FL2 transmitted to all facilities within larger geographic area on       Patient informed that his/her managed care company has contracts with or will negotiate with certain facilities, including the following:        Yes   Patient/family informed of bed offers received.  Patient chooses bed at [place] [place]     Physician recommends and patient chooses bed at      Patient to be transferred to [facility] on 11/17/18.  Patient to be transferred to facility by PTAR     Patient family notified on 11/17/18 of transfer.  Name of family member notified:  Kathrynn Humble, niece     PHYSICIAN Please prepare prescriptions     Additional Comment:     _______________________________________________ Vinie Sill, [place] 11/17/2018, 10:05 AM

## 2018-11-17 NOTE — Progress Notes (Signed)
IV and telemetry discontinued at this time. CCMD notified.  

## 2018-11-17 NOTE — Progress Notes (Signed)
Patient will DC to: [facility] Date: 11/17/2018 Family Notified: [name] , niece Transport By: Corey Harold @ 11:30 am  RN, patient, and facility notified of DC. Discharge Summary sent to facility. RN given number for report [phone], room 107. Ambulance transport requested for patient.   Clinical Social Worker signing off. Thurmond Butts, [place] Social Worker [phone]

## 2018-11-17 NOTE — Progress Notes (Signed)
Report called to [place], LPN at [facility]

## 2018-11-17 NOTE — Discharge Summary (Signed)
Physician Discharge Summary  Melinda Kerr JTT:[id number] DOB: Mar 24, 1935 DOA: 11/13/2018  PCP: Maury Dus, MD  Admit date: 11/13/2018 Discharge date: 11/17/2018  Admitted From: SNF Disposition:  SNF  Recommendations for Outpatient Follow-up:  1. Follow up with PCP in 1-2 weeks 2. Please obtain BMP/CBC in one week 3. Lisinopril held on DC, please monitor blood pressure and consider restarting as medically indicated  Home Health: none Equipment/Devices: none  Discharge Condition: stable CODE STATUS: Full code Diet recommendation: heart healthy  HPI: Per admitting MD, Melinda Kerr is a 84 y.o. female with medical history significant of morbid obesity, congestive heart failure, endometrial cancer, hypertension just discharged on February 3 from orthopedic team for repair of a right proximal humerus comminuted fracture and reverse shoulder arthroplasty has been in rehab since comes in with 1 day of vomiting bright red blood at least 10 times today and then subsequently started having melanotic stool.  Patient denies any epigastric or any abdominal pain anywhere.  She denies any shortness of breath or chest pain.  She denies any fevers.  She denies any history of peptic ulcer disease.  She denies taking any NSAIDs however she does have on her med rec list from the rehab center that she is on oxycodone and Ultram since her recent injury.  At her discharge last week patient had a hemoglobin level of around 9 and is down to 7.2.  Patient is being referred for admission for upper GI bleed.  She has been started on a Protonix drip and has had 2 units of packed red blood cells ordered which have not begun yet.  Her blood pressure initially was low and systolics in the 00P this is improved after liter fluid with a systolic blood pressure over 100.  GI Dr. Darci Needle has been called who is planning on taking her for EGD in the morning.  Hospital Course: Principal Problem Acute blood loss anemia due to  upper GI bleed -Continue PPI, patient was transfused a total of 4 units of packed red blood cells, hemoglobin has improved and remained stable. GI was consulted and followed patient while hospitalized, she underwent an EGD which showed a cameron lesion. She was placed on PPI which she is to continue on discharge. She is tolerating a regular diet, has returned to baseline and will be discharged in stable condition.   Active Problems Hypertension -BP normal, hold Lisinopril  History of DVT -No longer on anticoagulation History of malignant uterine neoplasm -Outpatient follow-up Status post total shoulder arthroplasty, right -addressed during her prior hospital stay, she went to an SNF.  She will be discharged to SNF however she requests a different location.  Social worker involved  Discharge Diagnoses:  Principal Problem:   GIB (gastrointestinal bleeding) Active Problems:   Essential hypertension   Malignant neoplasm of uterus ([place])   History of DVT (deep vein thrombosis)   S/P reverse total shoulder arthroplasty, right   Anemia associated with acute blood loss     Discharge Instructions   Allergies as of 11/17/2018      Reactions   Oxybutynin Other (See Comments)   Bleeding gums, felt sick      Medication List    STOP taking these medications   lisinopril 20 MG tablet Commonly known as:  PRINIVIL,ZESTRIL   omeprazole 20 MG tablet Commonly known as:  PRILOSEC OTC     TAKE these medications   acetaminophen 500 MG tablet Commonly known as:  TYLENOL Take 1,000 mg by mouth  2 (two) times daily.   alendronate 70 MG tablet Commonly known as:  FOSAMAX Take 70 mg by mouth every Wednesday. Take with a full glass of water on an empty stomach.   allopurinol 300 MG tablet Commonly known as:  ZYLOPRIM Take 300 mg by mouth daily.   calcium gluconate 500 MG tablet Take 1-2 tablets by mouth See admin instructions. Take 1 tablet (500 mg) by mouth every morning and 2 tablets (1000  mg) at bedtime   cetirizine 10 MG tablet Commonly known as:  ZYRTEC Take 10 mg by mouth daily.   citalopram 20 MG tablet Commonly known as:  CELEXA Take 20 mg by mouth daily.   Fish Oil 1000 MG Caps Take 1,000 mg by mouth daily.   furosemide 20 MG tablet Commonly known as:  LASIX Take 20 mg by mouth daily.   hydrOXYzine 25 MG tablet Commonly known as:  ATARAX/VISTARIL Take 25 mg by mouth at bedtime.   levothyroxine 125 MCG tablet Commonly known as:  SYNTHROID, LEVOTHROID Take 125 mcg by mouth daily at 6 (six) AM.   Melatonin 3 MG Tabs Take 3 mg by mouth at bedtime.   nystatin powder Generic drug:  nystatin Apply topically See admin instructions. Apply topically to abdominal skin folds and under bilateral breast for intertrigo - twice daily - please make sure skin has been cleaned and is dry before applying   ondansetron 4 MG tablet Commonly known as:  ZOFRAN Take 4 mg by mouth 4 (four) times daily as needed for nausea or vomiting.   oxyCODONE-acetaminophen 5-325 MG tablet Commonly known as:  PERCOCET Take 1 tablet by mouth every 4 (four) hours as needed (pain).   pantoprazole 40 MG tablet Commonly known as:  PROTONIX Take 1 tablet (40 mg total) by mouth 2 (two) times daily before a meal.   polyethylene glycol packet Commonly known as:  MIRALAX / GLYCOLAX Take 17 g by mouth daily as needed for mild constipation or moderate constipation.   pravastatin 10 MG tablet Commonly known as:  PRAVACHOL Take 10 mg by mouth every Wednesday. At bedtime   pyridOXINE 100 MG tablet Commonly known as:  VITAMIN B-6 Take 100 mg by mouth daily.   SENNA S 8.6-50 MG tablet Generic drug:  senna-docusate Take 2 tablets by mouth daily.   tolterodine 4 MG 24 hr capsule Commonly known as:  DETROL LA Take 4 mg by mouth every evening.   traMADol 50 MG tablet Commonly known as:  ULTRAM Take 1 tablet (50 mg total) by mouth every 6 (six) hours as needed (mild pain).   vitamin B-12  100 MCG tablet Commonly known as:  CYANOCOBALAMIN Take 100 mcg by mouth daily.   vitamin C 500 MG tablet Commonly known as:  ASCORBIC ACID Take 500 mg by mouth daily.      Follow-up Information    Maury Dus, MD. Schedule an appointment as soon as possible for a visit in 1 week(s).   Specialty:  Family Medicine Contact information: [address] [place] [address] [phone]           Consultations:  GI  Procedures/Studies:  EGD Impression:               - Normal esophagus.                           - Large hiatal hernia.                           -  Large hiatal hernia with multiple Cameron ulcers.                           - Normal examined duodenum.                           - No specimens collected. Recommendation:           - Clear liquid diet today. Then advance as                            tolerated.                           - Continue present medications.   Dg Shoulder Right  Result Date: 10/19/2018 CLINICAL DATA:  84 year old female with fall and right shoulder pain. EXAM: RIGHT SHOULDER - 2+ VIEW COMPARISON:  None FINDINGS: Evaluation is limited due to patient's body habitus and soft tissue attenuation. There is a displaced comminuted appearing fracture of the right humeral head with a displaced fracture of the greater tuberosity. There is anterior dislocation of the shoulder. There is an area of airspace opacity in the visualized right lower lung field which may represent a developing infiltrate or a mass. Further evaluation with dedicated chest radiograph or CT is recommended. This may also represent an area of atelectasis due to mass effect caused by hiatal hernia seen on the CT of 10/21/2017. IMPRESSION: 1. Displaced comminuted fracture of the right humeral head with displaced fracture of the greater tuberosity. There is anterior dislocation of the right shoulder. 2. Focal area of airspace opacity in the visualized right lower lung  field. Further evaluation with dedicated chest radiograph or CT is recommended. Electronically Signed   By: Anner Crete M.D.   On: 10/19/2018 00:08   Ct Head Wo Contrast  Result Date: 10/19/2018 CLINICAL DATA:  84 year old female with fall. EXAM: CT HEAD WITHOUT CONTRAST CT CERVICAL SPINE WITHOUT CONTRAST TECHNIQUE: Multidetector CT imaging of the head and cervical spine was performed following the standard protocol without intravenous contrast. Multiplanar CT image reconstructions of the cervical spine were also generated. COMPARISON:  None. FINDINGS: CT HEAD FINDINGS Brain: There is mild age-related atrophy and chronic microvascular ischemic changes. There is no acute intracranial hemorrhage. No mass effect or midline shift. No extra-axial fluid collection. Vascular: No hyperdense vessel or unexpected calcification. Skull: Normal. Negative for fracture or focal lesion. Sinuses/Orbits: No acute finding. Other: None CT CERVICAL SPINE FINDINGS Alignment: No acute subluxation. Skull base and vertebrae: No acute fracture. Osteopenia. Soft tissues and spinal canal: No prevertebral fluid or swelling. No visible canal hematoma. Disc levels:  Multilevel degenerative changes. Upper chest: Mild emphysema. Atherosclerotic calcification of the aortic arch. Other: Bilateral carotid bulb calcified plaques. IMPRESSION: 1. No acute intracranial hemorrhage. 2. No acute/traumatic cervical spine pathology. Electronically Signed   By: Anner Crete M.D.   On: 10/19/2018 00:38   Ct Angio Chest Pe W Or Wo Contrast  Result Date: 10/27/2018 CLINICAL DATA:  84 year old female with acute shortness of breath. Recent shoulder replacement. EXAM: CT ANGIOGRAPHY CHEST WITH CONTRAST TECHNIQUE: Multidetector CT imaging of the chest was performed using the standard protocol during bolus administration of intravenous contrast. Multiplanar CT image reconstructions and MIPs were obtained to evaluate the vascular anatomy. CONTRAST:   126mL ISOVUE-370 IOPAMIDOL (ISOVUE-370) INJECTION 76% COMPARISON:  10/26/2026 chest radiograph,  03/24/2016 chest CT and other studies. FINDINGS: Cardiovascular: This is a technically borderline study for the evaluation of pulmonary emboli due to streak and respiratory motion artifact. No definite pulmonary emboli are identified. Cardiomegaly again identified. Coronary artery and aortic atherosclerotic calcifications are present. No evidence of thoracic aortic aneurysm or pericardial effusion. Mediastinum/Nodes: A large hiatal hernia is again noted with distended stomach and esophagus. No enlarged lymph nodes or mediastinal mass noted. Lungs/Pleura: Increasing RIGHT LOWER lobe atelectasis noted in part due to very large hiatal hernia which contains a distended stomach. Moderate RIGHT UPPER lobe atelectasis is identified. Mild LEFT basilar atelectasis is present. No definite airspace disease or consolidation noted. No pleural effusion or pneumothorax. Upper Abdomen: No acute abnormality Musculoskeletal: RIGHT shoulder arthroplasty changes identified. No acute bony abnormalities are otherwise noted. Review of the MIP images confirms the above findings. IMPRESSION: 1. No definite evidence of pulmonary emboli. 2. Moderate RIGHT UPPER lobe atelectasis and increasing RIGHT LOWER lobe atelectasis. Increasing RIGHT LOWER lobe atelectasis is in part due to very large hiatal hernia which now contains a distended stomach. 3. Cardiomegaly 4. Coronary artery and Aortic Atherosclerosis (ICD10-I70.0). Electronically Signed   By: Margarette Canada M.D.   On: 10/27/2018 17:59   Ct Cervical Spine Wo Contrast  Result Date: 10/19/2018 CLINICAL DATA:  84 year old female with fall. EXAM: CT HEAD WITHOUT CONTRAST CT CERVICAL SPINE WITHOUT CONTRAST TECHNIQUE: Multidetector CT imaging of the head and cervical spine was performed following the standard protocol without intravenous contrast. Multiplanar CT image reconstructions of the cervical  spine were also generated. COMPARISON:  None. FINDINGS: CT HEAD FINDINGS Brain: There is mild age-related atrophy and chronic microvascular ischemic changes. There is no acute intracranial hemorrhage. No mass effect or midline shift. No extra-axial fluid collection. Vascular: No hyperdense vessel or unexpected calcification. Skull: Normal. Negative for fracture or focal lesion. Sinuses/Orbits: No acute finding. Other: None CT CERVICAL SPINE FINDINGS Alignment: No acute subluxation. Skull base and vertebrae: No acute fracture. Osteopenia. Soft tissues and spinal canal: No prevertebral fluid or swelling. No visible canal hematoma. Disc levels:  Multilevel degenerative changes. Upper chest: Mild emphysema. Atherosclerotic calcification of the aortic arch. Other: Bilateral carotid bulb calcified plaques. IMPRESSION: 1. No acute intracranial hemorrhage. 2. No acute/traumatic cervical spine pathology. Electronically Signed   By: Anner Crete M.D.   On: 10/19/2018 00:38   Ct Shoulder Right Wo Contrast  Result Date: 10/19/2018 CLINICAL DATA:  84 year old female with right shoulder fracture dislocation. EXAM: CT OF THE UPPER RIGHT EXTREMITY WITHOUT CONTRAST TECHNIQUE: Multidetector CT imaging of the upper right extremity was performed according to the standard protocol. COMPARISON:  Radiograph dated 10/18/2018 FINDINGS: Bones/Joint/Cartilage There is a comminuted and displaced fracture of the right humeral head with impaction of the humeral shaft at the neck. There may be slight anterior subluxation of the right shoulder. There is nondisplaced fracture of the posterior glenoid. The bones are osteopenic. There is a large amount of blood in the joint. Ligaments Suboptimally assessed by CT. Muscles and Tendons There is edema of the rotator cuff musculature. Soft tissues There is stranding and edema of the soft tissues and fat in the region of the right axilla. IMPRESSION: 1. Displaced comminuted fracture of the right  humeral head with impaction at the neck. 2. Nondisplaced fracture of the posterior glenoid. 3. Probable mild anterior subluxation of the right shoulder. 4. Large amount of blood within the right shoulder joint. Electronically Signed   By: Anner Crete M.D.   On: 10/19/2018 03:00  Dg Chest Port 1 View  Result Date: 10/26/2018 CLINICAL DATA:  Dyspnea. EXAM: PORTABLE CHEST 1 VIEW COMPARISON:  Radiograph 08/24/2016 FINDINGS: Lower lung volumes from prior exam. Cardiomegaly is similar. Aortic atherosclerosis. Vascular congestion without evidence pulmonary edema. Streaky right perihilar and left lung base atelectasis. No large pleural effusion or pneumothorax. Right shoulder arthroplasty partially included. IMPRESSION: 1. Low lung volumes with cardiomegaly and vascular congestion. 2. Streaky right perihilar and left lung base atelectasis. Electronically Signed   By: Keith Rake M.D.   On: 10/26/2018 23:48   Dg Shoulder Right Port  Result Date: 10/26/2018 CLINICAL DATA:  Status post reversed total shoulder arthroplasty. EXAM: PORTABLE RIGHT SHOULDER COMPARISON:  Radiographs of October 18, 2018. FINDINGS: The glenoid and humeral components of the prosthesis are well situated. No dislocation is noted. IMPRESSION: Status post right shoulder arthroplasty. Electronically Signed   By: Marijo Conception, M.D.   On: 10/26/2018 19:30     Subjective: - no chest pain, shortness of breath, no abdominal pain, nausea or vomiting.   Discharge Exam: Vitals:   11/17/18 0750  BP: 97/68  Pulse:   Resp:   Temp:   SpO2:     General: Pt is alert, awake, not in acute distress Cardiovascular: RRR, S1/S2 +, no rubs, no gallops Respiratory: CTA bilaterally, no wheezing, no rhonchi Abdominal: Soft, NT, ND, bowel sounds +   The results of significant diagnostics from this hospitalization (including imaging, microbiology, ancillary and laboratory) are listed below for reference.     Microbiology: No results  found for this or any previous visit (from the past 240 hour(s)).   Labs: BNP (last 3 results) Recent Labs    10/27/18 1129  BNP 627.0*   Basic Metabolic Panel: Recent Labs  Lab 11/13/18 1810 11/14/18 0355 11/15/18 0300 11/16/18 0243 11/17/18 0243  NA 135 136 137 137 136  K 5.6* 5.0 4.6 4.7 4.1  CL 100 106 109 106 102  CO2 27 26 23 28 26   GLUCOSE 163* 119* 95 108* 118*  BUN 60* 66* 34* 15 10  CREATININE 1.14* 0.91 0.85 0.77 0.70  CALCIUM 9.2 8.1* 8.3* 8.9 9.2  MG  --   --  2.0 1.8 1.6*   Liver Function Tests: Recent Labs  Lab 11/13/18 1810  AST 22  ALT 17  ALKPHOS 128*  BILITOT 0.8  PROT 5.2*  ALBUMIN 2.6*   No results for input(s): LIPASE, AMYLASE in the last 168 hours. No results for input(s): AMMONIA in the last 168 hours. CBC: Recent Labs  Lab 11/13/18 1810 11/14/18 0355 11/15/18 0300 11/15/18 1902 11/16/18 0243 11/17/18 0243  WBC 10.1 11.2* 10.2 8.8 7.8 7.7  NEUTROABS 8.4*  --   --   --   --   --   HGB 7.2* 7.4* 6.4* 8.5* 8.5* 8.9*  HCT 24.0* 23.3* 20.3* 27.3* 26.4* 28.9*  MCV 100.0 94.7 96.7 93.5 93.6 94.8  PLT 244 264 198 201 177 205   Cardiac Enzymes: No results for input(s): CKTOTAL, CKMB, CKMBINDEX, TROPONINI in the last 168 hours. BNP: Invalid input(s): POCBNP CBG: No results for input(s): GLUCAP in the last 168 hours. D-Dimer No results for input(s): DDIMER in the last 72 hours. Hgb A1c No results for input(s): HGBA1C in the last 72 hours. Lipid Profile No results for input(s): CHOL, HDL, LDLCALC, TRIG, CHOLHDL, LDLDIRECT in the last 72 hours. Thyroid function studies No results for input(s): TSH, T4TOTAL, T3FREE, THYROIDAB in the last 72 hours.  Invalid input(s): FREET3 Anemia work  up No results for input(s): VITAMINB12, FOLATE, FERRITIN, TIBC, IRON, RETICCTPCT in the last 72 hours. Urinalysis    Component Value Date/Time   COLORURINE STRAW (A) 07/13/2017 1858   APPEARANCEUR CLEAR 07/13/2017 1858   LABSPEC 1.042 (H)  07/13/2017 1858   LABSPEC 1.020 05/04/2016 1006   PHURINE 6.0 07/13/2017 1858   GLUCOSEU NEGATIVE 07/13/2017 1858   GLUCOSEU Negative 05/04/2016 1006   HGBUR SMALL (A) 07/13/2017 1858   BILIRUBINUR NEGATIVE 07/13/2017 1858   BILIRUBINUR Negative 05/04/2016 [place] 07/13/2017 1858   PROTEINUR NEGATIVE 07/13/2017 1858   UROBILINOGEN 0.2 05/04/2016 1006   NITRITE NEGATIVE 07/13/2017 [place] 07/13/2017 1858   LEUKOCYTESUR [place] 05/04/2016 1006   Sepsis Labs Invalid input(s): PROCALCITONIN,  WBC,  LACTICIDVEN   Time coordinating discharge: 35 minutes  SIGNED:  Marzetta Board, MD  [facility] 11/17/2018, 9:44 AM

## 2018-11-20 DIAGNOSIS — D649 Anemia, unspecified: Secondary | ICD-10-CM | POA: Diagnosis not present

## 2018-11-20 DIAGNOSIS — K922 Gastrointestinal hemorrhage, unspecified: Secondary | ICD-10-CM | POA: Diagnosis not present

## 2018-11-20 DIAGNOSIS — I10 Essential (primary) hypertension: Secondary | ICD-10-CM | POA: Diagnosis not present

## 2018-11-20 DIAGNOSIS — M25569 Pain in unspecified knee: Secondary | ICD-10-CM | POA: Diagnosis not present

## 2018-11-20 DIAGNOSIS — C55 Malignant neoplasm of uterus, part unspecified: Secondary | ICD-10-CM | POA: Diagnosis not present

## 2018-11-24 ENCOUNTER — Other Ambulatory Visit: Payer: Self-pay | Admitting: *Deleted

## 2018-11-25 NOTE — Patient Outreach (Signed)
[facility]) Care Management  11/24/18  Melinda Kerr Aug 17, 1935 [id number]   Facility site visit to [place]. Collaboration with IDT and [facility] UM RN concerning patient's progress, discharge plan and potential care management needs. PT states patient continues to be Mod assist with ADLs but able to ambulate 142ft with CGA. Discharge planner states patient was receiving therapy at [facility] prior to coming to this SNF, but had a rehospitalization while there. Nursing states tramadol is effective in controlling patient's pain.  Patient admitted to SNF on 11/17/18 after a hospitalization for anemia related to previous shoulder surgery.  Planned discharge date is next week(week of 11/27/18) and discharge disposition is home alone.  Discharge planner states the patient's niece is the main support person. Patient will have [place] at discharge. Patient was evaluated for community based chronic disease management services with [facility] care Management Program as a benefit of patient's Next Gen Medicare.   Went to patient's bedside to speak with patient and further assess for care management needs.  Patient very pleasant, alert and oriented.  Patient states she feels she is making progress and is looking forward to being ready to go home. Patient states her main support person is her niece Melinda Kerr. Patient endorses her primary care provider to be Maury Dus MD. Patient states transportation needs will be provided by her niece. Patient states medication management provided by herself.  Patient discussed medication cost is not currently a barrier to care. Introduced Psychologist, forensic and gave patient [facility] Patient Packet with my contact information included.  Patient agreed to [place] services.  Patient gave [phone] as the best number to reach her. Patient also gave verbal permission to speak with her niece Melinda Kerr.    Referral placed for [facility] to engage patient for transition of care and evaluate for monthly home visits.   [facility] Care Management services do not interfere with or replace any services arranged by the facility discharge planner.   [facility] UM team member aware [facility] will be following for care management.   For additional questions please contact:   Debbra Digiulio RN, [facility] Liaison  [phone]) Business Mobile [phone]) Toll free office

## 2018-12-04 DIAGNOSIS — E039 Hypothyroidism, unspecified: Secondary | ICD-10-CM | POA: Diagnosis not present

## 2018-12-04 DIAGNOSIS — K219 Gastro-esophageal reflux disease without esophagitis: Secondary | ICD-10-CM | POA: Diagnosis not present

## 2018-12-04 DIAGNOSIS — R41841 Cognitive communication deficit: Secondary | ICD-10-CM | POA: Diagnosis not present

## 2018-12-04 DIAGNOSIS — S42201D Unspecified fracture of upper end of right humerus, subsequent encounter for fracture with routine healing: Secondary | ICD-10-CM | POA: Diagnosis not present

## 2018-12-04 DIAGNOSIS — Z9181 History of falling: Secondary | ICD-10-CM | POA: Diagnosis not present

## 2018-12-04 DIAGNOSIS — M109 Gout, unspecified: Secondary | ICD-10-CM | POA: Diagnosis not present

## 2018-12-04 DIAGNOSIS — Z86718 Personal history of other venous thrombosis and embolism: Secondary | ICD-10-CM | POA: Diagnosis not present

## 2018-12-04 DIAGNOSIS — K5900 Constipation, unspecified: Secondary | ICD-10-CM | POA: Diagnosis not present

## 2018-12-04 DIAGNOSIS — Z96611 Presence of right artificial shoulder joint: Secondary | ICD-10-CM | POA: Diagnosis not present

## 2018-12-04 DIAGNOSIS — M810 Age-related osteoporosis without current pathological fracture: Secondary | ICD-10-CM | POA: Diagnosis not present

## 2018-12-04 DIAGNOSIS — I5020 Unspecified systolic (congestive) heart failure: Secondary | ICD-10-CM | POA: Diagnosis not present

## 2018-12-04 DIAGNOSIS — F329 Major depressive disorder, single episode, unspecified: Secondary | ICD-10-CM | POA: Diagnosis not present

## 2018-12-04 DIAGNOSIS — D62 Acute posthemorrhagic anemia: Secondary | ICD-10-CM | POA: Diagnosis not present

## 2018-12-04 DIAGNOSIS — Z79891 Long term (current) use of opiate analgesic: Secondary | ICD-10-CM | POA: Diagnosis not present

## 2018-12-04 DIAGNOSIS — I110 Hypertensive heart disease with heart failure: Secondary | ICD-10-CM | POA: Diagnosis not present

## 2018-12-05 ENCOUNTER — Other Ambulatory Visit: Payer: Self-pay | Admitting: *Deleted

## 2018-12-05 DIAGNOSIS — K922 Gastrointestinal hemorrhage, unspecified: Secondary | ICD-10-CM | POA: Diagnosis not present

## 2018-12-05 DIAGNOSIS — M109 Gout, unspecified: Secondary | ICD-10-CM | POA: Diagnosis not present

## 2018-12-05 DIAGNOSIS — G4733 Obstructive sleep apnea (adult) (pediatric): Secondary | ICD-10-CM | POA: Diagnosis not present

## 2018-12-05 DIAGNOSIS — I701 Atherosclerosis of renal artery: Secondary | ICD-10-CM | POA: Diagnosis not present

## 2018-12-05 DIAGNOSIS — I509 Heart failure, unspecified: Secondary | ICD-10-CM | POA: Diagnosis not present

## 2018-12-05 DIAGNOSIS — F324 Major depressive disorder, single episode, in partial remission: Secondary | ICD-10-CM | POA: Diagnosis not present

## 2018-12-05 NOTE — Patient Outreach (Signed)
[facility]) Care Management  12/05/2018  [name] 1935/06/17 [id number]   Notified that member was discharged from SNF last week, also notified that member triggered red on EMMI dashboard for no transportation to follow up MD appointment.  Referral was received as member was recently admitted to hospital 2/16 for GI bleed, went to [facility] for rehab.  Per chart, she also has history of hypertension, OSA, Diverticulitis, and dyslipidemia.    Call placed to member to follow up on discharge.  Identity verified.  This care manager introduced self and stated purpose of call.  [facility] care management services explained.  Member report she was visited by home health agency yesterday and was set up with PT/OT, nursing, and aide services.  State she has all medications prescribed, denies the need for pharmacy assistance.  State her niece will provide transportation to MD appointments.  She denies the need for [facility] assistance at this time.  This care manager inquired about contact with niece/caregiver [name] to provide benefits of program and inquire about needs.  She report [name] was in the home at the time of the call.  This care manager was able to speak with niece, benefits of [facility] care management services explained.  She agrees with member stating she felt member had all needs addressed at this time but state she will contact [facility] should needs change.    Will not open case at this time.   Valente David, [place], MSN [place] [phone]

## 2018-12-06 DIAGNOSIS — S42201D Unspecified fracture of upper end of right humerus, subsequent encounter for fracture with routine healing: Secondary | ICD-10-CM | POA: Diagnosis not present

## 2018-12-06 DIAGNOSIS — M810 Age-related osteoporosis without current pathological fracture: Secondary | ICD-10-CM | POA: Diagnosis not present

## 2018-12-06 DIAGNOSIS — I110 Hypertensive heart disease with heart failure: Secondary | ICD-10-CM | POA: Diagnosis not present

## 2018-12-06 DIAGNOSIS — I5020 Unspecified systolic (congestive) heart failure: Secondary | ICD-10-CM | POA: Diagnosis not present

## 2018-12-06 DIAGNOSIS — D62 Acute posthemorrhagic anemia: Secondary | ICD-10-CM | POA: Diagnosis not present

## 2018-12-09 DIAGNOSIS — M810 Age-related osteoporosis without current pathological fracture: Secondary | ICD-10-CM | POA: Diagnosis not present

## 2018-12-09 DIAGNOSIS — D62 Acute posthemorrhagic anemia: Secondary | ICD-10-CM | POA: Diagnosis not present

## 2018-12-09 DIAGNOSIS — I5020 Unspecified systolic (congestive) heart failure: Secondary | ICD-10-CM | POA: Diagnosis not present

## 2018-12-09 DIAGNOSIS — I110 Hypertensive heart disease with heart failure: Secondary | ICD-10-CM | POA: Diagnosis not present

## 2018-12-09 DIAGNOSIS — S42201D Unspecified fracture of upper end of right humerus, subsequent encounter for fracture with routine healing: Secondary | ICD-10-CM | POA: Diagnosis not present

## 2018-12-12 DIAGNOSIS — S42201D Unspecified fracture of upper end of right humerus, subsequent encounter for fracture with routine healing: Secondary | ICD-10-CM | POA: Diagnosis not present

## 2018-12-12 DIAGNOSIS — I5020 Unspecified systolic (congestive) heart failure: Secondary | ICD-10-CM | POA: Diagnosis not present

## 2018-12-12 DIAGNOSIS — I110 Hypertensive heart disease with heart failure: Secondary | ICD-10-CM | POA: Diagnosis not present

## 2018-12-12 DIAGNOSIS — D62 Acute posthemorrhagic anemia: Secondary | ICD-10-CM | POA: Diagnosis not present

## 2018-12-12 DIAGNOSIS — M810 Age-related osteoporosis without current pathological fracture: Secondary | ICD-10-CM | POA: Diagnosis not present

## 2018-12-14 DIAGNOSIS — M810 Age-related osteoporosis without current pathological fracture: Secondary | ICD-10-CM | POA: Diagnosis not present

## 2018-12-14 DIAGNOSIS — I110 Hypertensive heart disease with heart failure: Secondary | ICD-10-CM | POA: Diagnosis not present

## 2018-12-14 DIAGNOSIS — I5020 Unspecified systolic (congestive) heart failure: Secondary | ICD-10-CM | POA: Diagnosis not present

## 2018-12-14 DIAGNOSIS — D62 Acute posthemorrhagic anemia: Secondary | ICD-10-CM | POA: Diagnosis not present

## 2018-12-14 DIAGNOSIS — S42201D Unspecified fracture of upper end of right humerus, subsequent encounter for fracture with routine healing: Secondary | ICD-10-CM | POA: Diagnosis not present

## 2018-12-15 DIAGNOSIS — M810 Age-related osteoporosis without current pathological fracture: Secondary | ICD-10-CM | POA: Diagnosis not present

## 2018-12-15 DIAGNOSIS — I110 Hypertensive heart disease with heart failure: Secondary | ICD-10-CM | POA: Diagnosis not present

## 2018-12-15 DIAGNOSIS — Z4789 Encounter for other orthopedic aftercare: Secondary | ICD-10-CM | POA: Diagnosis not present

## 2018-12-15 DIAGNOSIS — S42201D Unspecified fracture of upper end of right humerus, subsequent encounter for fracture with routine healing: Secondary | ICD-10-CM | POA: Diagnosis not present

## 2018-12-15 DIAGNOSIS — I5020 Unspecified systolic (congestive) heart failure: Secondary | ICD-10-CM | POA: Diagnosis not present

## 2018-12-15 DIAGNOSIS — D62 Acute posthemorrhagic anemia: Secondary | ICD-10-CM | POA: Diagnosis not present

## 2018-12-16 DIAGNOSIS — D62 Acute posthemorrhagic anemia: Secondary | ICD-10-CM | POA: Diagnosis not present

## 2018-12-16 DIAGNOSIS — M810 Age-related osteoporosis without current pathological fracture: Secondary | ICD-10-CM | POA: Diagnosis not present

## 2018-12-16 DIAGNOSIS — I110 Hypertensive heart disease with heart failure: Secondary | ICD-10-CM | POA: Diagnosis not present

## 2018-12-16 DIAGNOSIS — I5020 Unspecified systolic (congestive) heart failure: Secondary | ICD-10-CM | POA: Diagnosis not present

## 2018-12-16 DIAGNOSIS — S42201D Unspecified fracture of upper end of right humerus, subsequent encounter for fracture with routine healing: Secondary | ICD-10-CM | POA: Diagnosis not present

## 2018-12-19 DIAGNOSIS — S42201D Unspecified fracture of upper end of right humerus, subsequent encounter for fracture with routine healing: Secondary | ICD-10-CM | POA: Diagnosis not present

## 2018-12-19 DIAGNOSIS — M810 Age-related osteoporosis without current pathological fracture: Secondary | ICD-10-CM | POA: Diagnosis not present

## 2018-12-19 DIAGNOSIS — I110 Hypertensive heart disease with heart failure: Secondary | ICD-10-CM | POA: Diagnosis not present

## 2018-12-19 DIAGNOSIS — I5020 Unspecified systolic (congestive) heart failure: Secondary | ICD-10-CM | POA: Diagnosis not present

## 2018-12-19 DIAGNOSIS — D62 Acute posthemorrhagic anemia: Secondary | ICD-10-CM | POA: Diagnosis not present

## 2018-12-20 DIAGNOSIS — M810 Age-related osteoporosis without current pathological fracture: Secondary | ICD-10-CM | POA: Diagnosis not present

## 2018-12-20 DIAGNOSIS — S42201D Unspecified fracture of upper end of right humerus, subsequent encounter for fracture with routine healing: Secondary | ICD-10-CM | POA: Diagnosis not present

## 2018-12-20 DIAGNOSIS — I5020 Unspecified systolic (congestive) heart failure: Secondary | ICD-10-CM | POA: Diagnosis not present

## 2018-12-20 DIAGNOSIS — I110 Hypertensive heart disease with heart failure: Secondary | ICD-10-CM | POA: Diagnosis not present

## 2018-12-20 DIAGNOSIS — D62 Acute posthemorrhagic anemia: Secondary | ICD-10-CM | POA: Diagnosis not present

## 2018-12-21 DIAGNOSIS — M810 Age-related osteoporosis without current pathological fracture: Secondary | ICD-10-CM | POA: Diagnosis not present

## 2018-12-21 DIAGNOSIS — S42201D Unspecified fracture of upper end of right humerus, subsequent encounter for fracture with routine healing: Secondary | ICD-10-CM | POA: Diagnosis not present

## 2018-12-21 DIAGNOSIS — D62 Acute posthemorrhagic anemia: Secondary | ICD-10-CM | POA: Diagnosis not present

## 2018-12-21 DIAGNOSIS — I5020 Unspecified systolic (congestive) heart failure: Secondary | ICD-10-CM | POA: Diagnosis not present

## 2018-12-21 DIAGNOSIS — I110 Hypertensive heart disease with heart failure: Secondary | ICD-10-CM | POA: Diagnosis not present

## 2018-12-23 DIAGNOSIS — I110 Hypertensive heart disease with heart failure: Secondary | ICD-10-CM | POA: Diagnosis not present

## 2018-12-23 DIAGNOSIS — S42201D Unspecified fracture of upper end of right humerus, subsequent encounter for fracture with routine healing: Secondary | ICD-10-CM | POA: Diagnosis not present

## 2018-12-23 DIAGNOSIS — M810 Age-related osteoporosis without current pathological fracture: Secondary | ICD-10-CM | POA: Diagnosis not present

## 2018-12-23 DIAGNOSIS — D62 Acute posthemorrhagic anemia: Secondary | ICD-10-CM | POA: Diagnosis not present

## 2018-12-23 DIAGNOSIS — I5020 Unspecified systolic (congestive) heart failure: Secondary | ICD-10-CM | POA: Diagnosis not present

## 2018-12-26 DIAGNOSIS — D62 Acute posthemorrhagic anemia: Secondary | ICD-10-CM | POA: Diagnosis not present

## 2018-12-26 DIAGNOSIS — S42201D Unspecified fracture of upper end of right humerus, subsequent encounter for fracture with routine healing: Secondary | ICD-10-CM | POA: Diagnosis not present

## 2018-12-26 DIAGNOSIS — I5020 Unspecified systolic (congestive) heart failure: Secondary | ICD-10-CM | POA: Diagnosis not present

## 2018-12-26 DIAGNOSIS — I110 Hypertensive heart disease with heart failure: Secondary | ICD-10-CM | POA: Diagnosis not present

## 2018-12-26 DIAGNOSIS — M810 Age-related osteoporosis without current pathological fracture: Secondary | ICD-10-CM | POA: Diagnosis not present

## 2018-12-27 DIAGNOSIS — D62 Acute posthemorrhagic anemia: Secondary | ICD-10-CM | POA: Diagnosis not present

## 2018-12-27 DIAGNOSIS — I5020 Unspecified systolic (congestive) heart failure: Secondary | ICD-10-CM | POA: Diagnosis not present

## 2018-12-27 DIAGNOSIS — I110 Hypertensive heart disease with heart failure: Secondary | ICD-10-CM | POA: Diagnosis not present

## 2018-12-27 DIAGNOSIS — S42201D Unspecified fracture of upper end of right humerus, subsequent encounter for fracture with routine healing: Secondary | ICD-10-CM | POA: Diagnosis not present

## 2018-12-27 DIAGNOSIS — M810 Age-related osteoporosis without current pathological fracture: Secondary | ICD-10-CM | POA: Diagnosis not present

## 2018-12-28 DIAGNOSIS — S42201D Unspecified fracture of upper end of right humerus, subsequent encounter for fracture with routine healing: Secondary | ICD-10-CM | POA: Diagnosis not present

## 2018-12-28 DIAGNOSIS — I5020 Unspecified systolic (congestive) heart failure: Secondary | ICD-10-CM | POA: Diagnosis not present

## 2018-12-28 DIAGNOSIS — I110 Hypertensive heart disease with heart failure: Secondary | ICD-10-CM | POA: Diagnosis not present

## 2018-12-28 DIAGNOSIS — M810 Age-related osteoporosis without current pathological fracture: Secondary | ICD-10-CM | POA: Diagnosis not present

## 2018-12-28 DIAGNOSIS — D62 Acute posthemorrhagic anemia: Secondary | ICD-10-CM | POA: Diagnosis not present

## 2018-12-29 DIAGNOSIS — S42201D Unspecified fracture of upper end of right humerus, subsequent encounter for fracture with routine healing: Secondary | ICD-10-CM | POA: Diagnosis not present

## 2018-12-29 DIAGNOSIS — I110 Hypertensive heart disease with heart failure: Secondary | ICD-10-CM | POA: Diagnosis not present

## 2018-12-29 DIAGNOSIS — I5020 Unspecified systolic (congestive) heart failure: Secondary | ICD-10-CM | POA: Diagnosis not present

## 2018-12-29 DIAGNOSIS — M810 Age-related osteoporosis without current pathological fracture: Secondary | ICD-10-CM | POA: Diagnosis not present

## 2018-12-29 DIAGNOSIS — D62 Acute posthemorrhagic anemia: Secondary | ICD-10-CM | POA: Diagnosis not present

## 2019-01-02 DIAGNOSIS — I110 Hypertensive heart disease with heart failure: Secondary | ICD-10-CM | POA: Diagnosis not present

## 2019-01-02 DIAGNOSIS — S42201D Unspecified fracture of upper end of right humerus, subsequent encounter for fracture with routine healing: Secondary | ICD-10-CM | POA: Diagnosis not present

## 2019-01-02 DIAGNOSIS — M810 Age-related osteoporosis without current pathological fracture: Secondary | ICD-10-CM | POA: Diagnosis not present

## 2019-01-02 DIAGNOSIS — I5020 Unspecified systolic (congestive) heart failure: Secondary | ICD-10-CM | POA: Diagnosis not present

## 2019-01-02 DIAGNOSIS — D62 Acute posthemorrhagic anemia: Secondary | ICD-10-CM | POA: Diagnosis not present

## 2019-01-03 DIAGNOSIS — I110 Hypertensive heart disease with heart failure: Secondary | ICD-10-CM | POA: Diagnosis not present

## 2019-01-03 DIAGNOSIS — M810 Age-related osteoporosis without current pathological fracture: Secondary | ICD-10-CM | POA: Diagnosis not present

## 2019-01-03 DIAGNOSIS — Z79891 Long term (current) use of opiate analgesic: Secondary | ICD-10-CM | POA: Diagnosis not present

## 2019-01-03 DIAGNOSIS — K5900 Constipation, unspecified: Secondary | ICD-10-CM | POA: Diagnosis not present

## 2019-01-03 DIAGNOSIS — D62 Acute posthemorrhagic anemia: Secondary | ICD-10-CM | POA: Diagnosis not present

## 2019-01-03 DIAGNOSIS — S42201D Unspecified fracture of upper end of right humerus, subsequent encounter for fracture with routine healing: Secondary | ICD-10-CM | POA: Diagnosis not present

## 2019-01-03 DIAGNOSIS — M109 Gout, unspecified: Secondary | ICD-10-CM | POA: Diagnosis not present

## 2019-01-03 DIAGNOSIS — E039 Hypothyroidism, unspecified: Secondary | ICD-10-CM | POA: Diagnosis not present

## 2019-01-03 DIAGNOSIS — F329 Major depressive disorder, single episode, unspecified: Secondary | ICD-10-CM | POA: Diagnosis not present

## 2019-01-03 DIAGNOSIS — R41841 Cognitive communication deficit: Secondary | ICD-10-CM | POA: Diagnosis not present

## 2019-01-03 DIAGNOSIS — I5020 Unspecified systolic (congestive) heart failure: Secondary | ICD-10-CM | POA: Diagnosis not present

## 2019-01-03 DIAGNOSIS — Z86718 Personal history of other venous thrombosis and embolism: Secondary | ICD-10-CM | POA: Diagnosis not present

## 2019-01-03 DIAGNOSIS — Z96611 Presence of right artificial shoulder joint: Secondary | ICD-10-CM | POA: Diagnosis not present

## 2019-01-03 DIAGNOSIS — K219 Gastro-esophageal reflux disease without esophagitis: Secondary | ICD-10-CM | POA: Diagnosis not present

## 2019-01-03 DIAGNOSIS — Z9181 History of falling: Secondary | ICD-10-CM | POA: Diagnosis not present

## 2019-01-04 DIAGNOSIS — M810 Age-related osteoporosis without current pathological fracture: Secondary | ICD-10-CM | POA: Diagnosis not present

## 2019-01-04 DIAGNOSIS — D62 Acute posthemorrhagic anemia: Secondary | ICD-10-CM | POA: Diagnosis not present

## 2019-01-04 DIAGNOSIS — S42201D Unspecified fracture of upper end of right humerus, subsequent encounter for fracture with routine healing: Secondary | ICD-10-CM | POA: Diagnosis not present

## 2019-01-04 DIAGNOSIS — I110 Hypertensive heart disease with heart failure: Secondary | ICD-10-CM | POA: Diagnosis not present

## 2019-01-04 DIAGNOSIS — I5020 Unspecified systolic (congestive) heart failure: Secondary | ICD-10-CM | POA: Diagnosis not present

## 2019-01-05 DIAGNOSIS — D62 Acute posthemorrhagic anemia: Secondary | ICD-10-CM | POA: Diagnosis not present

## 2019-01-05 DIAGNOSIS — S42201D Unspecified fracture of upper end of right humerus, subsequent encounter for fracture with routine healing: Secondary | ICD-10-CM | POA: Diagnosis not present

## 2019-01-05 DIAGNOSIS — M810 Age-related osteoporosis without current pathological fracture: Secondary | ICD-10-CM | POA: Diagnosis not present

## 2019-01-05 DIAGNOSIS — I5020 Unspecified systolic (congestive) heart failure: Secondary | ICD-10-CM | POA: Diagnosis not present

## 2019-01-05 DIAGNOSIS — I110 Hypertensive heart disease with heart failure: Secondary | ICD-10-CM | POA: Diagnosis not present

## 2019-01-09 DIAGNOSIS — I5020 Unspecified systolic (congestive) heart failure: Secondary | ICD-10-CM | POA: Diagnosis not present

## 2019-01-09 DIAGNOSIS — D62 Acute posthemorrhagic anemia: Secondary | ICD-10-CM | POA: Diagnosis not present

## 2019-01-09 DIAGNOSIS — S42201D Unspecified fracture of upper end of right humerus, subsequent encounter for fracture with routine healing: Secondary | ICD-10-CM | POA: Diagnosis not present

## 2019-01-09 DIAGNOSIS — M810 Age-related osteoporosis without current pathological fracture: Secondary | ICD-10-CM | POA: Diagnosis not present

## 2019-01-09 DIAGNOSIS — I110 Hypertensive heart disease with heart failure: Secondary | ICD-10-CM | POA: Diagnosis not present

## 2019-01-10 DIAGNOSIS — M810 Age-related osteoporosis without current pathological fracture: Secondary | ICD-10-CM | POA: Diagnosis not present

## 2019-01-10 DIAGNOSIS — I5020 Unspecified systolic (congestive) heart failure: Secondary | ICD-10-CM | POA: Diagnosis not present

## 2019-01-10 DIAGNOSIS — I110 Hypertensive heart disease with heart failure: Secondary | ICD-10-CM | POA: Diagnosis not present

## 2019-01-10 DIAGNOSIS — D62 Acute posthemorrhagic anemia: Secondary | ICD-10-CM | POA: Diagnosis not present

## 2019-01-10 DIAGNOSIS — S42201D Unspecified fracture of upper end of right humerus, subsequent encounter for fracture with routine healing: Secondary | ICD-10-CM | POA: Diagnosis not present

## 2019-01-11 DIAGNOSIS — M810 Age-related osteoporosis without current pathological fracture: Secondary | ICD-10-CM | POA: Diagnosis not present

## 2019-01-11 DIAGNOSIS — I5020 Unspecified systolic (congestive) heart failure: Secondary | ICD-10-CM | POA: Diagnosis not present

## 2019-01-11 DIAGNOSIS — S42201D Unspecified fracture of upper end of right humerus, subsequent encounter for fracture with routine healing: Secondary | ICD-10-CM | POA: Diagnosis not present

## 2019-01-11 DIAGNOSIS — I110 Hypertensive heart disease with heart failure: Secondary | ICD-10-CM | POA: Diagnosis not present

## 2019-01-11 DIAGNOSIS — D62 Acute posthemorrhagic anemia: Secondary | ICD-10-CM | POA: Diagnosis not present

## 2019-01-13 DIAGNOSIS — S42201D Unspecified fracture of upper end of right humerus, subsequent encounter for fracture with routine healing: Secondary | ICD-10-CM | POA: Diagnosis not present

## 2019-01-13 DIAGNOSIS — M810 Age-related osteoporosis without current pathological fracture: Secondary | ICD-10-CM | POA: Diagnosis not present

## 2019-01-13 DIAGNOSIS — I110 Hypertensive heart disease with heart failure: Secondary | ICD-10-CM | POA: Diagnosis not present

## 2019-01-13 DIAGNOSIS — I5020 Unspecified systolic (congestive) heart failure: Secondary | ICD-10-CM | POA: Diagnosis not present

## 2019-01-13 DIAGNOSIS — D62 Acute posthemorrhagic anemia: Secondary | ICD-10-CM | POA: Diagnosis not present

## 2019-01-16 DIAGNOSIS — S42201D Unspecified fracture of upper end of right humerus, subsequent encounter for fracture with routine healing: Secondary | ICD-10-CM | POA: Diagnosis not present

## 2019-01-16 DIAGNOSIS — D62 Acute posthemorrhagic anemia: Secondary | ICD-10-CM | POA: Diagnosis not present

## 2019-01-16 DIAGNOSIS — I5020 Unspecified systolic (congestive) heart failure: Secondary | ICD-10-CM | POA: Diagnosis not present

## 2019-01-16 DIAGNOSIS — M810 Age-related osteoporosis without current pathological fracture: Secondary | ICD-10-CM | POA: Diagnosis not present

## 2019-01-16 DIAGNOSIS — I110 Hypertensive heart disease with heart failure: Secondary | ICD-10-CM | POA: Diagnosis not present

## 2019-01-18 DIAGNOSIS — D62 Acute posthemorrhagic anemia: Secondary | ICD-10-CM | POA: Diagnosis not present

## 2019-01-18 DIAGNOSIS — M810 Age-related osteoporosis without current pathological fracture: Secondary | ICD-10-CM | POA: Diagnosis not present

## 2019-01-18 DIAGNOSIS — S42201D Unspecified fracture of upper end of right humerus, subsequent encounter for fracture with routine healing: Secondary | ICD-10-CM | POA: Diagnosis not present

## 2019-01-18 DIAGNOSIS — I110 Hypertensive heart disease with heart failure: Secondary | ICD-10-CM | POA: Diagnosis not present

## 2019-01-18 DIAGNOSIS — I5020 Unspecified systolic (congestive) heart failure: Secondary | ICD-10-CM | POA: Diagnosis not present

## 2019-01-20 DIAGNOSIS — M810 Age-related osteoporosis without current pathological fracture: Secondary | ICD-10-CM | POA: Diagnosis not present

## 2019-01-20 DIAGNOSIS — I110 Hypertensive heart disease with heart failure: Secondary | ICD-10-CM | POA: Diagnosis not present

## 2019-01-20 DIAGNOSIS — S42201D Unspecified fracture of upper end of right humerus, subsequent encounter for fracture with routine healing: Secondary | ICD-10-CM | POA: Diagnosis not present

## 2019-01-20 DIAGNOSIS — I5020 Unspecified systolic (congestive) heart failure: Secondary | ICD-10-CM | POA: Diagnosis not present

## 2019-01-20 DIAGNOSIS — D62 Acute posthemorrhagic anemia: Secondary | ICD-10-CM | POA: Diagnosis not present

## 2019-01-23 DIAGNOSIS — D62 Acute posthemorrhagic anemia: Secondary | ICD-10-CM | POA: Diagnosis not present

## 2019-01-23 DIAGNOSIS — M810 Age-related osteoporosis without current pathological fracture: Secondary | ICD-10-CM | POA: Diagnosis not present

## 2019-01-23 DIAGNOSIS — S42201D Unspecified fracture of upper end of right humerus, subsequent encounter for fracture with routine healing: Secondary | ICD-10-CM | POA: Diagnosis not present

## 2019-01-23 DIAGNOSIS — I5020 Unspecified systolic (congestive) heart failure: Secondary | ICD-10-CM | POA: Diagnosis not present

## 2019-01-23 DIAGNOSIS — I110 Hypertensive heart disease with heart failure: Secondary | ICD-10-CM | POA: Diagnosis not present

## 2019-01-25 DIAGNOSIS — S42201D Unspecified fracture of upper end of right humerus, subsequent encounter for fracture with routine healing: Secondary | ICD-10-CM | POA: Diagnosis not present

## 2019-01-25 DIAGNOSIS — I110 Hypertensive heart disease with heart failure: Secondary | ICD-10-CM | POA: Diagnosis not present

## 2019-01-25 DIAGNOSIS — M810 Age-related osteoporosis without current pathological fracture: Secondary | ICD-10-CM | POA: Diagnosis not present

## 2019-01-25 DIAGNOSIS — I5020 Unspecified systolic (congestive) heart failure: Secondary | ICD-10-CM | POA: Diagnosis not present

## 2019-01-25 DIAGNOSIS — D62 Acute posthemorrhagic anemia: Secondary | ICD-10-CM | POA: Diagnosis not present

## 2019-01-27 DIAGNOSIS — I5020 Unspecified systolic (congestive) heart failure: Secondary | ICD-10-CM | POA: Diagnosis not present

## 2019-01-27 DIAGNOSIS — I110 Hypertensive heart disease with heart failure: Secondary | ICD-10-CM | POA: Diagnosis not present

## 2019-01-27 DIAGNOSIS — D62 Acute posthemorrhagic anemia: Secondary | ICD-10-CM | POA: Diagnosis not present

## 2019-01-27 DIAGNOSIS — M810 Age-related osteoporosis without current pathological fracture: Secondary | ICD-10-CM | POA: Diagnosis not present

## 2019-01-27 DIAGNOSIS — S42201D Unspecified fracture of upper end of right humerus, subsequent encounter for fracture with routine healing: Secondary | ICD-10-CM | POA: Diagnosis not present

## 2019-01-31 DIAGNOSIS — I110 Hypertensive heart disease with heart failure: Secondary | ICD-10-CM | POA: Diagnosis not present

## 2019-01-31 DIAGNOSIS — M810 Age-related osteoporosis without current pathological fracture: Secondary | ICD-10-CM | POA: Diagnosis not present

## 2019-01-31 DIAGNOSIS — I5020 Unspecified systolic (congestive) heart failure: Secondary | ICD-10-CM | POA: Diagnosis not present

## 2019-01-31 DIAGNOSIS — D62 Acute posthemorrhagic anemia: Secondary | ICD-10-CM | POA: Diagnosis not present

## 2019-01-31 DIAGNOSIS — S42201D Unspecified fracture of upper end of right humerus, subsequent encounter for fracture with routine healing: Secondary | ICD-10-CM | POA: Diagnosis not present

## 2019-02-09 ENCOUNTER — Telehealth: Payer: Self-pay

## 2019-02-09 NOTE — Telephone Encounter (Signed)
Pt calling to schedule f/u with Dr. Sondra Come as her last appt was canceled due to fall with injury.  Pt transferred to scheduler per pt request. Loma Sousa, RN BSN

## 2019-02-17 DIAGNOSIS — G4700 Insomnia, unspecified: Secondary | ICD-10-CM | POA: Diagnosis not present

## 2019-02-27 DIAGNOSIS — S42201D Unspecified fracture of upper end of right humerus, subsequent encounter for fracture with routine healing: Secondary | ICD-10-CM | POA: Diagnosis not present

## 2019-03-06 ENCOUNTER — Encounter: Payer: Self-pay | Admitting: Podiatry

## 2019-03-06 ENCOUNTER — Other Ambulatory Visit: Payer: Self-pay

## 2019-03-06 ENCOUNTER — Ambulatory Visit (INDEPENDENT_AMBULATORY_CARE_PROVIDER_SITE_OTHER): Payer: Medicare Other | Admitting: Podiatry

## 2019-03-06 VITALS — Temp 97.9°F

## 2019-03-06 DIAGNOSIS — E119 Type 2 diabetes mellitus without complications: Secondary | ICD-10-CM | POA: Diagnosis not present

## 2019-03-06 DIAGNOSIS — L84 Corns and callosities: Secondary | ICD-10-CM

## 2019-03-06 DIAGNOSIS — B351 Tinea unguium: Secondary | ICD-10-CM | POA: Diagnosis not present

## 2019-03-06 DIAGNOSIS — M79676 Pain in unspecified toe(s): Secondary | ICD-10-CM | POA: Diagnosis not present

## 2019-03-06 NOTE — Patient Instructions (Signed)

## 2019-03-10 NOTE — Progress Notes (Signed)
Subjective: Melinda Kerr presents to clinic for preventative diabetic(diet-controlled) foot care with cc of painful mycotic toenails and calluses which are aggravated when weightbearing with and without shoe gear.  This pain limits her daily activities. Pain symptoms resolve with periodic professional debridement.  Melinda Kerr relates since her last visit she fell and broke her shoulder. She had it repaired and went to [facility] for [employer]. While at [facility], she had an episode of GI bleeding and was admitted back to the hospital. After that hospitalization, she was sent to [place] [facility] [place].   Maury Dus, MD is her PCP and last visit was 2-3 weeks ago per patient recall.  Past Medical History:  Diagnosis Date  . Arthritis    "shoulders, knees, lower back" (07/14/2017)  . CHF (congestive heart failure) ([place])    "while in hospital w/hysterectomy in 2017"  . Chronic lower back pain   . Depression   . Diverticulitis of large intestine with abscess 07/13/2017  . DVT (deep venous thrombosis) ([place])   . Dyspnea    "cause I'm over weight"  . Esophageal reflux   . Gout   . Hyperlipidemia    under control  . Hypertension   . Hypothyroidism   . Morbid obesity with BMI of 40.0-44.9, adult ([place])   . Nocturia   . OSA on CPAP    "not wearing it when I sleep in my lift chair" (07/14/2017)  . Osteoarthritis   . Phlebitis    hx of  . Pneumonia ?1989  . Spondylosis    with scoliosis  . Type II diabetes mellitus (HCC)    borderline , diet controlled   . Urinary frequency   . Uterine cancer (HCC)    S/P chemo, radiation, hysterectomy   Past Surgical History:  Procedure Laterality Date  . APPENDECTOMY    . CATARACT EXTRACTION, BILATERAL Bilateral 2018  . COLONOSCOPY    . DILATATION & CURETTAGE/HYSTEROSCOPY WITH MYOSURE N/A 03/04/2016   Procedure: DILATATION & CURETTAGE/HYSTEROSCOPY ;  Surgeon: Azucena Fallen, MD;  Location: [facility];  Service: Gynecology;  Laterality:  N/A;  polyp removal   . ESOPHAGOGASTRODUODENOSCOPY (EGD) WITH PROPOFOL N/A 11/14/2018   Procedure: ESOPHAGOGASTRODUODENOSCOPY (EGD) WITH PROPOFOL;  Surgeon: Wonda Horner, MD;  Location[facility];  Service: Endoscopy;  Laterality: N/A;  . HERNIA REPAIR    . IR GENERIC HISTORICAL  05/15/2016   IR US GUIDE VASC ACCESS RIGHT 05/15/2016 Aletta Edouard, MD [facility]  . IR GENERIC HISTORICAL  05/15/2016   IR FLUORO GUIDE CV LINE RIGHT 05/15/2016 Aletta Edouard, MD [facility]  . IR REMOVAL TUN ACCESS W/ PORT W/O FL MOD SED  04/19/2018  . LAPAROSCOPIC CHOLECYSTECTOMY  2004  . LAPAROSCOPIC INCISIONAL / UMBILICAL / VENTRAL HERNIA REPAIR  2004   "w/gallbladder OR"  . LYMPH NODE BIOPSY N/A 03/26/2016   Procedure: [place] LYMPH NODE BIOPSY;  Surgeon: Everitt Amber, MD;  Location: [facility];  Service: Gynecology;  Laterality: N/A;  . REVERSE SHOULDER ARTHROPLASTY Right 10/26/2018   Procedure: REVERSE SHOULDER ARTHROPLASTY;  Surgeon: Nicholes Stairs, MD;  Location: [facility];  Service: Orthopedics;  Laterality: Right;  . ROBOTIC ASSISTED TOTAL HYSTERECTOMY WITH BILATERAL SALPINGO OOPHERECTOMY N/A 03/26/2016   Procedure: XI ROBOTIC ASSISTED TOTAL Laproscopic HYSTERECTOMY WITH BILATERAL SALPINGO OOPHORECTOMY;  Surgeon: Everitt Amber, MD;  Location: [facility];  Service: Gynecology;  Laterality: N/A;  . TOTAL KNEE ARTHROPLASTY Left 10/09/2013   Procedure: LEFT TOTAL KNEE ARTHROPLASTY;  Surgeon: [name], MD;  Location: [facility];  Service: Orthopedics;  Laterality: Left;  . TOTAL KNEE ARTHROPLASTY Right 04/09/2014   Procedure: RIGHT TOTAL KNEE ARTHROPLASTY;  Surgeon: [name], MD;  Location: [facility];  Service: Orthopedics;  Laterality: Right;  . TUBAL LIGATION  1982  . VARICOSE VEIN SURGERY Bilateral 1968  . WISDOM TOOTH EXTRACTION  1963   Current Outpatient Medications:  .  acetaminophen (TYLENOL) 500 MG tablet, Take 1,000 mg by mouth 2 (two) times daily. , Disp: , Rfl:  .  alendronate (FOSAMAX) 70 MG  tablet, Take 70 mg by mouth every Wednesday. Take with a full glass of water on an empty stomach. , Disp: , Rfl:  .  allopurinol (ZYLOPRIM) 300 MG tablet, Take 300 mg by mouth daily. , Disp: , Rfl:  .  calcium gluconate 500 MG tablet, Take 1-2 tablets by mouth See admin instructions. Take 1 tablet (500 mg) by mouth every morning and 2 tablets (1000 mg) at bedtime, Disp: , Rfl:  .  cetirizine (ZYRTEC) 10 MG tablet, Take 10 mg by mouth daily., Disp: , Rfl:  .  citalopram (CELEXA) 20 MG tablet, Take 20 mg by mouth daily. , Disp: , Rfl:  .  furosemide (LASIX) 20 MG tablet, Take 20 mg by mouth daily., Disp: , Rfl:  .  hydrOXYzine (ATARAX/VISTARIL) 25 MG tablet, Take 25 mg by mouth at bedtime., Disp: , Rfl:  .  hydrOXYzine (VISTARIL) 25 MG capsule, hydroxyzine pamoate 25 mg capsule, Disp: , Rfl:  .  levothyroxine (SYNTHROID, LEVOTHROID) 125 MCG tablet, Take 125 mcg by mouth daily at 6 (six) AM. , Disp: , Rfl:  .  Melatonin 3 MG TABS, Take 3 mg by mouth at bedtime. , Disp: , Rfl:  .  nystatin (NYSTATIN) powder, Apply topically See admin instructions. Apply topically to abdominal skin folds and under bilateral breast for intertrigo - twice daily - please make sure skin has been cleaned and is dry before applying, Disp: , Rfl:  .  Omega-3 Fatty Acids (FISH OIL) 1000 MG CAPS, Take 1,000 mg by mouth daily. , Disp: , Rfl:  .  ondansetron (ZOFRAN) 4 MG tablet, Take 4 mg by mouth 4 (four) times daily as needed for nausea or vomiting., Disp: , Rfl:  .  oxyCODONE-acetaminophen (PERCOCET) 5-325 MG tablet, Take 1 tablet by mouth every 4 (four) hours as needed (pain)., Disp: 10 tablet, Rfl: 0 .  pantoprazole (PROTONIX) 40 MG tablet, Take 1 tablet (40 mg total) by mouth 2 (two) times daily before a meal., Disp: 60 tablet, Rfl: 1 .  polyethylene glycol (MIRALAX / GLYCOLAX) packet, Take 17 g by mouth daily as needed for mild constipation or moderate constipation., Disp: 14 each, Rfl: 0 .  pravastatin (PRAVACHOL) 10 MG  tablet, Take 10 mg by mouth every Wednesday. At bedtime, Disp: , Rfl:  .  pyridOXINE (VITAMIN B-6) 100 MG tablet, Take 100 mg by mouth daily., Disp: , Rfl:  .  rosuvastatin (CRESTOR) 10 MG tablet, rosuvastatin 10 mg tablet, Disp: , Rfl:  .  senna-docusate (SENNA S) 8.6-50 MG tablet, Take 2 tablets by mouth daily., Disp: , Rfl:  .  tolterodine (DETROL LA) 4 MG 24 hr capsule, Take 4 mg by mouth every evening. , Disp: , Rfl:  .  traMADol (ULTRAM) 50 MG tablet, Take 1 tablet (50 mg total) by mouth every 6 (six) hours as needed (mild pain)., Disp: 10 tablet, Rfl: 0 .  vitamin B-12 (CYANOCOBALAMIN) 100 MCG tablet, Take 100 mcg by mouth daily. ,  Disp: , Rfl:  .  vitamin C (ASCORBIC ACID) 500 MG tablet, Take 500 mg by mouth daily., Disp: , Rfl:  .  Zoster Vaccine Adjuvanted (SHINGRIX) injection, Shingrix (PF) 50 mcg/0.5 mL intramuscular suspension, kit, Disp: , Rfl:    Allergies  Allergen Reactions  . Oxybutynin Other (See Comments)    Bleeding gums, felt sick     Objective: Vitals:   03/06/19 1420  Temp: 97.9 F (36.6 C)    Physical Examination:  Vascular  Examination: Capillary refill time immediate x 10 digits.  Palpable DP/PT pulses b/l.  Digital hair present b/l.  No edema noted b/l.  Skin temperature gradient WNL b/l.  Dermatological Examination: Skin with normal turgor, texture and tone b/l.  No open wounds b/l.  No interdigital macerations noted b/l.  Elongated, thick, discolored brittle toenails with subungual debris and pain on dorsal palpation of nailbeds 1-5 b/l.  Hyperkeratotic lesion(s) noted submet head 1 b/l.  No erythema, no edema, no drainage, no flocculence.  Musculoskeletal Examination: Muscle strength 5/5 to all muscle groups b/l.  No pain, crepitus or joint discomfort with active/passive ROM.  Neurological Examination: Sensation intact 5/5 b/l with 10 gram monofilament.  Vibratory sensation intact b/l.  Proprioceptive sensation intact  b/l.  Hemoglobin A1C Latest Ref Rng & Units 10/26/2018  HGBA1C 4.8 - 5.6 % 6.2(H)  Some recent data might be hidden   Assessment: 1. Mycotic nail infection with pain 1-5 b/l 2. Callus submet head 1 b/l 3. NIDDM (diet controlled)  Plan: 1. Toenails 1-5 b/l were debrided in length and girth without iatrogenic laceration. Calluses pared submetatarsal head(s) 1 b/l utilizing sterile scalpel blade without incident. Continue soft, supportive shoe gear daily. Report any pedal injuries to medical professional. Follow up 3 months. Patient/POA to call should there be a question/concern in there interim.

## 2019-03-12 NOTE — Progress Notes (Signed)
Radiation Oncology         ([phone]) [phone] ________________________________  Name: Melinda Kerr MRN: [id number]  Date: 03/13/2019  DOB: [birth year]  Follow-Up Visit Note  CC: Maury Dus, MD  Gordy Levan, MD    ICD-10-CM   1. Endometrial cancer, FIGO stage IIIC (HCC)  C54.1   2. 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 She last saw Dr. Harrington Challenger on 07/11/2018. She states she uses her vaginal dilator three times per week.  Her previous appointment was cancelled due to a fall that fractured her shoulder on 10/18/2017. She underwent surgery on 10/26/2018.  She missed her  follow-up with Dr. Denman George this year due to all of her medical issues.  She reports mild fatigue and an episode of diarrhea last night. She denies pain, hematuria or dysuria, nausea or vomiting, vaginal discharge or bleeding, and rectal bleeding.                         ALLERGIES:  is allergic to oxybutynin.  Meds: Current Outpatient Medications  Medication Sig Dispense Refill  . acetaminophen (TYLENOL) 500 MG tablet Take 1,000 mg by mouth 2 (two) times daily.     Marland Kitchen alendronate (FOSAMAX) 70 MG tablet Take 70 mg by mouth every Wednesday. Take with a full glass of water on an empty stomach.     Marland Kitchen allopurinol (ZYLOPRIM) 300 MG tablet Take 300 mg by mouth daily.     . calcium gluconate 500 MG tablet Take 1-2 tablets by mouth See admin instructions. Take 1 tablet (500 mg) by mouth every morning and 2 tablets (1000 mg) at bedtime    . cetirizine (ZYRTEC) 10 MG tablet Take 10 mg by mouth daily.    . citalopram (CELEXA) 20 MG tablet Take 20 mg by mouth daily.     . furosemide (LASIX) 20 MG tablet Take 20 mg by mouth daily.    . hydrOXYzine (ATARAX/VISTARIL) 25  MG tablet Take 25 mg by mouth at bedtime.    . hydrOXYzine (VISTARIL) 25 MG capsule hydroxyzine pamoate 25 mg capsule    . levothyroxine (SYNTHROID, LEVOTHROID) 125 MCG tablet Take 125 mcg by mouth daily at 6 (six) AM.     . Melatonin 3 MG TABS Take 3 mg by mouth at bedtime.     Marland Kitchen nystatin (NYSTATIN) powder Apply topically See admin instructions. Apply topically to abdominal skin folds and under bilateral breast for intertrigo - twice daily - please make sure skin has been cleaned and is dry before applying    . Omega-3 Fatty Acids (FISH OIL) 1000 MG CAPS Take 1,000 mg by mouth daily.     . ondansetron (ZOFRAN) 4 MG tablet Take 4 mg by mouth 4 (four) times daily as needed for nausea or vomiting.    . pantoprazole (PROTONIX) 40 MG tablet Take 1 tablet (40 mg total) by mouth 2 (two) times daily before a meal. 60 tablet 1  . polyethylene glycol (MIRALAX / GLYCOLAX) packet Take 17 g by mouth daily as needed for mild constipation or moderate constipation. 14 each 0  . pravastatin (PRAVACHOL) 10 MG tablet Take 10 mg by mouth every Wednesday. At  bedtime    . pyridOXINE (VITAMIN B-6) 100 MG tablet Take 100 mg by mouth daily.    Marland Kitchen senna-docusate (SENNA S) 8.6-50 MG tablet Take 2 tablets by mouth daily.    Marland Kitchen tolterodine (DETROL LA) 4 MG 24 hr capsule Take 4 mg by mouth every evening.     . traMADol (ULTRAM) 50 MG tablet Take 1 tablet (50 mg total) by mouth every 6 (six) hours as needed (mild pain). 10 tablet 0  . vitamin B-12 (CYANOCOBALAMIN) 100 MCG tablet Take 100 mcg by mouth daily.     . vitamin C (ASCORBIC ACID) 500 MG tablet Take 500 mg by mouth daily.    Marland Kitchen oxyCODONE-acetaminophen (PERCOCET) 5-325 MG tablet Take 1 tablet by mouth every 4 (four) hours as needed (pain). (Patient not taking: Reported on 03/13/2019) 10 tablet 0  . rosuvastatin (CRESTOR) 10 MG tablet rosuvastatin 10 mg tablet    . Zoster Vaccine Adjuvanted [facility]) injection Shingrix (PF) 50 mcg/0.5 mL intramuscular suspension, kit      No current facility-administered medications for this encounter.     Physical Findings: The patient is in no acute distress. Patient is alert and oriented.  weight is 243 lb 2 oz (110.3 kg). Her oral temperature is 98.2 F (36.8 C). Her blood pressure is 115/97 (abnormal) and her pulse is 89. Her respiration is 20 and oxygen saturation is 96%. .     Lungs are clear to auscultation bilaterally. Heart has regular rate and rhythm. No palpable cervical, supraclavicular, or axillary adenopathy. Abdomen soft, non-tender, normal bowel sounds.  On pelvic examination the external genitalia were unremarkable. A speculum exam was performed with a small speculum. There are no mucosal lesions noted in the vaginal vault. On bimanual examination there were no pelvic masses appreciated.   Lab Findings: Lab Results  Component Value Date   WBC 7.7 11/17/2018   HGB 8.9 (L) 11/17/2018   HCT 28.9 (L) 11/17/2018   MCV 94.8 11/17/2018   PLT 205 11/17/2018    Radiographic Findings: No results found.  Impression:    no evidence recurrence on clinical exam   Plan: She is now 2 years out from her radiation treatment and will be switched to every 28-monthfollow-up.  She will be seen by Dr. RDenman Georgein approximately 6 months and follow-up in radiation oncology in 1 year  ____________________________________ -----------------------------------  JBlair Promise PhD, MD  This document serves as a record of services personally performed by JGery Pray MD. It was created on his behalf by KWilburn Mylar a trained medical scribe. The creation of this record is based on the scribe's personal observations and the provider's statements to them. This document has been checked and approved by the attending provider.

## 2019-03-13 ENCOUNTER — Other Ambulatory Visit: Payer: Self-pay

## 2019-03-13 ENCOUNTER — Ambulatory Visit
Admission: RE | Admit: 2019-03-13 | Discharge: 2019-03-13 | Disposition: A | Discharge status: Home / Self Care | Payer: Medicare Other | Source: Ambulatory Visit | Attending: Radiation Oncology | Admitting: Radiation Oncology

## 2019-03-13 ENCOUNTER — Encounter: Payer: Self-pay | Admitting: Radiation Oncology

## 2019-03-13 VITALS — BP 115/97 | HR 89 | Temp 98.2°F | Resp 20 | Wt 243.1 lb

## 2019-03-13 DIAGNOSIS — C541 Malignant neoplasm of endometrium: Secondary | ICD-10-CM

## 2019-03-13 DIAGNOSIS — Z8542 Personal history of malignant neoplasm of other parts of uterus: Secondary | ICD-10-CM | POA: Diagnosis not present

## 2019-03-13 DIAGNOSIS — Z08 Encounter for follow-up examination after completed treatment for malignant neoplasm: Secondary | ICD-10-CM | POA: Diagnosis not present

## 2019-03-13 NOTE — Progress Notes (Signed)
Patient is here for a follow-up appointment denies any pain ,reports mild fatigue. Patient denies any hematuria or dysuria. Patient states that she had diarrhea last night. Patient denies any n/v. Patient denies any vaginal or rectal bleeding.Patient denies any vaginal discharge.Patient states that she uses the vaginal dilator three times per week. Vitals:   03/13/19 1600  BP: (!) 115/97  Pulse: 89  Resp: 20  Temp: 98.2 F (36.8 C)  TempSrc: Oral  SpO2: 96%  Weight: 243 lb 2 oz (110.3 kg)

## 2019-03-28 DIAGNOSIS — R921 Mammographic calcification found on diagnostic imaging of breast: Secondary | ICD-10-CM | POA: Diagnosis not present

## 2019-04-05 DIAGNOSIS — M8580 Other specified disorders of bone density and structure, unspecified site: Secondary | ICD-10-CM | POA: Diagnosis not present

## 2019-04-05 DIAGNOSIS — E1122 Type 2 diabetes mellitus with diabetic chronic kidney disease: Secondary | ICD-10-CM | POA: Diagnosis not present

## 2019-04-05 DIAGNOSIS — R609 Edema, unspecified: Secondary | ICD-10-CM | POA: Diagnosis not present

## 2019-04-05 DIAGNOSIS — M545 Low back pain: Secondary | ICD-10-CM | POA: Diagnosis not present

## 2019-04-05 DIAGNOSIS — E7800 Pure hypercholesterolemia, unspecified: Secondary | ICD-10-CM | POA: Diagnosis not present

## 2019-04-05 DIAGNOSIS — N3281 Overactive bladder: Secondary | ICD-10-CM | POA: Diagnosis not present

## 2019-04-05 DIAGNOSIS — G4700 Insomnia, unspecified: Secondary | ICD-10-CM | POA: Diagnosis not present

## 2019-04-05 DIAGNOSIS — I129 Hypertensive chronic kidney disease with stage 1 through stage 4 chronic kidney disease, or unspecified chronic kidney disease: Secondary | ICD-10-CM | POA: Diagnosis not present

## 2019-04-05 DIAGNOSIS — K219 Gastro-esophageal reflux disease without esophagitis: Secondary | ICD-10-CM | POA: Diagnosis not present

## 2019-04-05 DIAGNOSIS — M109 Gout, unspecified: Secondary | ICD-10-CM | POA: Diagnosis not present

## 2019-04-05 DIAGNOSIS — E039 Hypothyroidism, unspecified: Secondary | ICD-10-CM | POA: Diagnosis not present

## 2019-04-05 DIAGNOSIS — Z6841 Body Mass Index (BMI) 40.0 and over, adult: Secondary | ICD-10-CM | POA: Diagnosis not present

## 2019-04-08 ENCOUNTER — Encounter (HOSPITAL_COMMUNITY): Payer: Self-pay

## 2019-04-08 ENCOUNTER — Inpatient Hospital Stay (HOSPITAL_COMMUNITY)
Admission: EM | Admit: 2019-04-08 | Discharge: 2019-04-13 | DRG: 481 | Disposition: A | Discharge status: Skilled Nursing Facility | Payer: Medicare Other | Attending: Internal Medicine | Admitting: Internal Medicine

## 2019-04-08 ENCOUNTER — Other Ambulatory Visit: Payer: Self-pay

## 2019-04-08 ENCOUNTER — Emergency Department (HOSPITAL_COMMUNITY): Payer: Medicare Other

## 2019-04-08 DIAGNOSIS — D72829 Elevated white blood cell count, unspecified: Secondary | ICD-10-CM | POA: Diagnosis present

## 2019-04-08 DIAGNOSIS — Z9989 Dependence on other enabling machines and devices: Secondary | ICD-10-CM | POA: Diagnosis not present

## 2019-04-08 DIAGNOSIS — E039 Hypothyroidism, unspecified: Secondary | ICD-10-CM | POA: Diagnosis present

## 2019-04-08 DIAGNOSIS — I509 Heart failure, unspecified: Secondary | ICD-10-CM | POA: Diagnosis not present

## 2019-04-08 DIAGNOSIS — W19XXXA Unspecified fall, initial encounter: Secondary | ICD-10-CM | POA: Diagnosis present

## 2019-04-08 DIAGNOSIS — G4733 Obstructive sleep apnea (adult) (pediatric): Secondary | ICD-10-CM | POA: Diagnosis present

## 2019-04-08 DIAGNOSIS — E1165 Type 2 diabetes mellitus with hyperglycemia: Secondary | ICD-10-CM | POA: Diagnosis present

## 2019-04-08 DIAGNOSIS — R000 Tachycardia, unspecified: Secondary | ICD-10-CM | POA: Diagnosis not present

## 2019-04-08 DIAGNOSIS — K449 Diaphragmatic hernia without obstruction or gangrene: Secondary | ICD-10-CM | POA: Diagnosis not present

## 2019-04-08 DIAGNOSIS — E038 Other specified hypothyroidism: Secondary | ICD-10-CM

## 2019-04-08 DIAGNOSIS — Z9221 Personal history of antineoplastic chemotherapy: Secondary | ICD-10-CM

## 2019-04-08 DIAGNOSIS — S7221XA Displaced subtrochanteric fracture of right femur, initial encounter for closed fracture: Principal | ICD-10-CM | POA: Diagnosis present

## 2019-04-08 DIAGNOSIS — Z6841 Body Mass Index (BMI) 40.0 and over, adult: Secondary | ICD-10-CM

## 2019-04-08 DIAGNOSIS — Z96653 Presence of artificial knee joint, bilateral: Secondary | ICD-10-CM | POA: Diagnosis present

## 2019-04-08 DIAGNOSIS — T796XXD Traumatic ischemia of muscle, subsequent encounter: Secondary | ICD-10-CM | POA: Diagnosis not present

## 2019-04-08 DIAGNOSIS — D649 Anemia, unspecified: Secondary | ICD-10-CM | POA: Diagnosis not present

## 2019-04-08 DIAGNOSIS — Z9981 Dependence on supplemental oxygen: Secondary | ICD-10-CM

## 2019-04-08 DIAGNOSIS — K219 Gastro-esophageal reflux disease without esophagitis: Secondary | ICD-10-CM | POA: Diagnosis present

## 2019-04-08 DIAGNOSIS — S72001A Fracture of unspecified part of neck of right femur, initial encounter for closed fracture: Secondary | ICD-10-CM | POA: Diagnosis not present

## 2019-04-08 DIAGNOSIS — G4700 Insomnia, unspecified: Secondary | ICD-10-CM | POA: Diagnosis not present

## 2019-04-08 DIAGNOSIS — F329 Major depressive disorder, single episode, unspecified: Secondary | ICD-10-CM | POA: Diagnosis present

## 2019-04-08 DIAGNOSIS — C541 Malignant neoplasm of endometrium: Secondary | ICD-10-CM

## 2019-04-08 DIAGNOSIS — R0902 Hypoxemia: Secondary | ICD-10-CM | POA: Diagnosis not present

## 2019-04-08 DIAGNOSIS — Z8542 Personal history of malignant neoplasm of other parts of uterus: Secondary | ICD-10-CM | POA: Diagnosis not present

## 2019-04-08 DIAGNOSIS — S72351D Displaced comminuted fracture of shaft of right femur, subsequent encounter for closed fracture with routine healing: Secondary | ICD-10-CM | POA: Diagnosis not present

## 2019-04-08 DIAGNOSIS — Z4801 Encounter for change or removal of surgical wound dressing: Secondary | ICD-10-CM | POA: Diagnosis not present

## 2019-04-08 DIAGNOSIS — Z888 Allergy status to other drugs, medicaments and biological substances status: Secondary | ICD-10-CM

## 2019-04-08 DIAGNOSIS — T148XXA Other injury of unspecified body region, initial encounter: Secondary | ICD-10-CM

## 2019-04-08 DIAGNOSIS — M6282 Rhabdomyolysis: Secondary | ICD-10-CM | POA: Diagnosis not present

## 2019-04-08 DIAGNOSIS — C55 Malignant neoplasm of uterus, part unspecified: Secondary | ICD-10-CM | POA: Diagnosis present

## 2019-04-08 DIAGNOSIS — I110 Hypertensive heart disease with heart failure: Secondary | ICD-10-CM | POA: Diagnosis present

## 2019-04-08 DIAGNOSIS — K922 Gastrointestinal hemorrhage, unspecified: Secondary | ICD-10-CM | POA: Diagnosis present

## 2019-04-08 DIAGNOSIS — J9811 Atelectasis: Secondary | ICD-10-CM | POA: Diagnosis not present

## 2019-04-08 DIAGNOSIS — G8929 Other chronic pain: Secondary | ICD-10-CM | POA: Diagnosis present

## 2019-04-08 DIAGNOSIS — W19XXXD Unspecified fall, subsequent encounter: Secondary | ICD-10-CM | POA: Diagnosis not present

## 2019-04-08 DIAGNOSIS — S72351A Displaced comminuted fracture of shaft of right femur, initial encounter for closed fracture: Secondary | ICD-10-CM | POA: Diagnosis present

## 2019-04-08 DIAGNOSIS — K30 Functional dyspepsia: Secondary | ICD-10-CM | POA: Diagnosis not present

## 2019-04-08 DIAGNOSIS — I959 Hypotension, unspecified: Secondary | ICD-10-CM | POA: Diagnosis not present

## 2019-04-08 DIAGNOSIS — Z03818 Encounter for observation for suspected exposure to other biological agents ruled out: Secondary | ICD-10-CM | POA: Diagnosis not present

## 2019-04-08 DIAGNOSIS — I5032 Chronic diastolic (congestive) heart failure: Secondary | ICD-10-CM | POA: Diagnosis present

## 2019-04-08 DIAGNOSIS — Z90710 Acquired absence of both cervix and uterus: Secondary | ICD-10-CM | POA: Diagnosis not present

## 2019-04-08 DIAGNOSIS — D509 Iron deficiency anemia, unspecified: Secondary | ICD-10-CM | POA: Diagnosis not present

## 2019-04-08 DIAGNOSIS — M25561 Pain in right knee: Secondary | ICD-10-CM | POA: Diagnosis not present

## 2019-04-08 DIAGNOSIS — I2510 Atherosclerotic heart disease of native coronary artery without angina pectoris: Secondary | ICD-10-CM | POA: Diagnosis not present

## 2019-04-08 DIAGNOSIS — R52 Pain, unspecified: Secondary | ICD-10-CM | POA: Diagnosis not present

## 2019-04-08 DIAGNOSIS — M6281 Muscle weakness (generalized): Secondary | ICD-10-CM | POA: Diagnosis not present

## 2019-04-08 DIAGNOSIS — S8991XA Unspecified injury of right lower leg, initial encounter: Secondary | ICD-10-CM | POA: Diagnosis not present

## 2019-04-08 DIAGNOSIS — R531 Weakness: Secondary | ICD-10-CM | POA: Diagnosis not present

## 2019-04-08 DIAGNOSIS — J9601 Acute respiratory failure with hypoxia: Secondary | ICD-10-CM | POA: Diagnosis not present

## 2019-04-08 DIAGNOSIS — Z4789 Encounter for other orthopedic aftercare: Secondary | ICD-10-CM | POA: Diagnosis not present

## 2019-04-08 DIAGNOSIS — M1990 Unspecified osteoarthritis, unspecified site: Secondary | ICD-10-CM | POA: Diagnosis present

## 2019-04-08 DIAGNOSIS — S79929A Unspecified injury of unspecified thigh, initial encounter: Secondary | ICD-10-CM | POA: Diagnosis not present

## 2019-04-08 DIAGNOSIS — S7291XA Unspecified fracture of right femur, initial encounter for closed fracture: Secondary | ICD-10-CM | POA: Diagnosis not present

## 2019-04-08 DIAGNOSIS — I10 Essential (primary) hypertension: Secondary | ICD-10-CM | POA: Diagnosis not present

## 2019-04-08 DIAGNOSIS — Z1159 Encounter for screening for other viral diseases: Secondary | ICD-10-CM

## 2019-04-08 DIAGNOSIS — Y92019 Unspecified place in single-family (private) house as the place of occurrence of the external cause: Secondary | ICD-10-CM

## 2019-04-08 DIAGNOSIS — Z86718 Personal history of other venous thrombosis and embolism: Secondary | ICD-10-CM | POA: Diagnosis not present

## 2019-04-08 DIAGNOSIS — M79604 Pain in right leg: Secondary | ICD-10-CM | POA: Diagnosis not present

## 2019-04-08 DIAGNOSIS — F32A Depression, unspecified: Secondary | ICD-10-CM | POA: Diagnosis present

## 2019-04-08 DIAGNOSIS — I5030 Unspecified diastolic (congestive) heart failure: Secondary | ICD-10-CM | POA: Diagnosis not present

## 2019-04-08 DIAGNOSIS — Z79899 Other long term (current) drug therapy: Secondary | ICD-10-CM

## 2019-04-08 DIAGNOSIS — E118 Type 2 diabetes mellitus with unspecified complications: Secondary | ICD-10-CM

## 2019-04-08 DIAGNOSIS — E785 Hyperlipidemia, unspecified: Secondary | ICD-10-CM | POA: Diagnosis present

## 2019-04-08 DIAGNOSIS — Z7901 Long term (current) use of anticoagulants: Secondary | ICD-10-CM | POA: Diagnosis not present

## 2019-04-08 DIAGNOSIS — M2550 Pain in unspecified joint: Secondary | ICD-10-CM | POA: Diagnosis not present

## 2019-04-08 DIAGNOSIS — E538 Deficiency of other specified B group vitamins: Secondary | ICD-10-CM | POA: Diagnosis not present

## 2019-04-08 DIAGNOSIS — L299 Pruritus, unspecified: Secondary | ICD-10-CM | POA: Diagnosis not present

## 2019-04-08 DIAGNOSIS — S72141A Displaced intertrochanteric fracture of right femur, initial encounter for closed fracture: Secondary | ICD-10-CM | POA: Diagnosis not present

## 2019-04-08 DIAGNOSIS — M109 Gout, unspecified: Secondary | ICD-10-CM | POA: Diagnosis present

## 2019-04-08 DIAGNOSIS — M545 Low back pain: Secondary | ICD-10-CM | POA: Diagnosis present

## 2019-04-08 DIAGNOSIS — S299XXA Unspecified injury of thorax, initial encounter: Secondary | ICD-10-CM | POA: Diagnosis not present

## 2019-04-08 DIAGNOSIS — J309 Allergic rhinitis, unspecified: Secondary | ICD-10-CM | POA: Diagnosis not present

## 2019-04-08 DIAGNOSIS — R110 Nausea: Secondary | ICD-10-CM | POA: Diagnosis not present

## 2019-04-08 DIAGNOSIS — Z79890 Hormone replacement therapy (postmenopausal): Secondary | ICD-10-CM

## 2019-04-08 DIAGNOSIS — Z823 Family history of stroke: Secondary | ICD-10-CM

## 2019-04-08 DIAGNOSIS — T84194A Other mechanical complication of internal fixation device of right femur, initial encounter: Secondary | ICD-10-CM | POA: Diagnosis not present

## 2019-04-08 DIAGNOSIS — Z8249 Family history of ischemic heart disease and other diseases of the circulatory system: Secondary | ICD-10-CM

## 2019-04-08 DIAGNOSIS — M62838 Other muscle spasm: Secondary | ICD-10-CM | POA: Diagnosis not present

## 2019-04-08 DIAGNOSIS — Z7401 Bed confinement status: Secondary | ICD-10-CM | POA: Diagnosis not present

## 2019-04-08 DIAGNOSIS — K5900 Constipation, unspecified: Secondary | ICD-10-CM | POA: Diagnosis not present

## 2019-04-08 HISTORY — DX: Displaced comminuted fracture of shaft of right femur, initial encounter for closed fracture: S72.351A

## 2019-04-08 LAB — PROTIME-INR
INR: 1.1 (ref 0.8–1.2)
Prothrombin Time: 14.4 seconds (ref 11.4–15.2)

## 2019-04-08 LAB — COMPREHENSIVE METABOLIC PANEL
ALT: 20 U/L (ref 0–44)
AST: 37 U/L (ref 15–41)
Albumin: 3.4 g/dL — ABNORMAL LOW (ref 3.5–5.0)
Alkaline Phosphatase: 108 U/L (ref 38–126)
Anion gap: 8 (ref 5–15)
BUN: 11 mg/dL (ref 8–23)
CO2: 25 mmol/L (ref 22–32)
Calcium: 9.2 mg/dL (ref 8.9–10.3)
Chloride: 102 mmol/L (ref 98–111)
Creatinine, Ser: 0.87 mg/dL (ref 0.44–1.00)
GFR calc Af Amer: >60 mL/min (ref >60)
GFR calc non Af Amer: >60 mL/min (ref >60)
Glucose, Bld: 194 mg/dL — ABNORMAL HIGH (ref 70–99)
Potassium: 4.6 mmol/L (ref 3.5–5.1)
Sodium: 135 mmol/L (ref 135–145)
Total Bilirubin: 0.7 mg/dL (ref 0.3–1.2)
Total Protein: 6.3 g/dL — ABNORMAL LOW (ref 6.5–8.1)

## 2019-04-08 LAB — URINALYSIS, ROUTINE W REFLEX MICROSCOPIC
Bilirubin Urine: NEGATIVE
Glucose, UA: NEGATIVE mg/dL
Hgb urine dipstick: NEGATIVE
Ketones, ur: 5 mg/dL — AB
Leukocytes,Ua: NEGATIVE
Nitrite: NEGATIVE
Protein, ur: NEGATIVE mg/dL
Specific Gravity, Urine: 1.017 (ref 1.005–1.030)
pH: 7 (ref 5.0–8.0)

## 2019-04-08 LAB — CBC WITH DIFFERENTIAL/PLATELET
Abs Immature Granulocytes: 0.06 10*3/uL (ref 0.00–0.07)
Basophils Absolute: 0 10*3/uL (ref 0.0–0.1)
Basophils Relative: 0 %
Eosinophils Absolute: 0 10*3/uL (ref 0.0–0.5)
Eosinophils Relative: 0 %
HCT: 33.8 % — ABNORMAL LOW (ref 36.0–46.0)
Hemoglobin: 9.9 g/dL — ABNORMAL LOW (ref 12.0–15.0)
Immature Granulocytes: 1 %
Lymphocytes Relative: 4 %
Lymphs Abs: 0.4 10*3/uL — ABNORMAL LOW (ref 0.7–4.0)
MCH: 23.4 pg — ABNORMAL LOW (ref 26.0–34.0)
MCHC: 29.3 g/dL — ABNORMAL LOW (ref 30.0–36.0)
MCV: 79.9 fL — ABNORMAL LOW (ref 80.0–100.0)
Monocytes Absolute: 0.7 10*3/uL (ref 0.1–1.0)
Monocytes Relative: 6 %
Neutro Abs: 10.1 10*3/uL — ABNORMAL HIGH (ref 1.7–7.7)
Neutrophils Relative %: 89 %
Platelets: 287 10*3/uL (ref 150–400)
RBC: 4.23 MIL/uL (ref 3.87–5.11)
RDW: 18.6 % — ABNORMAL HIGH (ref 11.5–15.5)
WBC: 11.2 10*3/uL — ABNORMAL HIGH (ref 4.0–10.5)
nRBC: 0 % (ref 0.0–0.2)

## 2019-04-08 LAB — SARS CORONAVIRUS 2 BY RT PCR (HOSPITAL ORDER, PERFORMED IN ~~LOC~~ HOSPITAL LAB): SARS Coronavirus 2: NEGATIVE

## 2019-04-08 LAB — TYPE AND SCREEN
ABO/RH(D): A NEG
Antibody Screen: NEGATIVE

## 2019-04-08 LAB — CK: Total CK: 694 U/L — ABNORMAL HIGH (ref 38–234)

## 2019-04-08 MED ORDER — HYDROCODONE-ACETAMINOPHEN 5-325 MG PO TABS
1.0000 | ORAL_TABLET | Freq: Four times a day (QID) | ORAL | Status: DC | PRN
  Administered 2019-04-08: 2 via ORAL
  Filled 2019-04-08: qty 2

## 2019-04-08 MED ORDER — ACETAMINOPHEN 500 MG PO TABS
1000.0000 mg | ORAL_TABLET | Freq: Once | ORAL | Status: AC
  Administered 2019-04-08: 1000 mg via ORAL
  Filled 2019-04-08: qty 2

## 2019-04-08 MED ORDER — HYDROXYZINE HCL 25 MG PO TABS
25.0000 mg | ORAL_TABLET | Freq: Every day | ORAL | Status: DC
  Administered 2019-04-08 – 2019-04-12 (×5): 25 mg via ORAL
  Filled 2019-04-08 (×5): qty 1

## 2019-04-08 MED ORDER — HYDROCODONE-ACETAMINOPHEN 5-325 MG PO TABS
1.0000 | ORAL_TABLET | ORAL | Status: DC | PRN
  Administered 2019-04-08: 2 via ORAL
  Filled 2019-04-08: qty 2

## 2019-04-08 MED ORDER — INSULIN ASPART 100 UNIT/ML ~~LOC~~ SOLN
0.0000 [IU] | Freq: Three times a day (TID) | SUBCUTANEOUS | Status: DC
  Administered 2019-04-09: 2 [IU] via SUBCUTANEOUS
  Administered 2019-04-09 – 2019-04-10 (×2): 1 [IU] via SUBCUTANEOUS
  Administered 2019-04-10: 2 [IU] via SUBCUTANEOUS
  Administered 2019-04-10: 1 [IU] via SUBCUTANEOUS
  Administered 2019-04-11 (×2): 2 [IU] via SUBCUTANEOUS
  Administered 2019-04-12: 1 [IU] via SUBCUTANEOUS
  Administered 2019-04-12: 2 [IU] via SUBCUTANEOUS
  Administered 2019-04-13: 1 [IU] via SUBCUTANEOUS

## 2019-04-08 MED ORDER — MELATONIN 3 MG PO TABS
3.0000 mg | ORAL_TABLET | Freq: Every day | ORAL | Status: DC
  Administered 2019-04-08 – 2019-04-12 (×5): 3 mg via ORAL
  Filled 2019-04-08 (×6): qty 1

## 2019-04-08 MED ORDER — ENSURE PRE-SURGERY PO LIQD
296.0000 mL | Freq: Once | ORAL | Status: AC
  Administered 2019-04-08: 296 mL via ORAL
  Filled 2019-04-08: qty 296

## 2019-04-08 MED ORDER — TRANEXAMIC ACID-NACL 1000-0.7 MG/100ML-% IV SOLN
1000.0000 mg | INTRAVENOUS | Status: AC
  Administered 2019-04-09: 1000 mg via INTRAVENOUS

## 2019-04-08 MED ORDER — POVIDONE-IODINE 10 % EX SWAB: 2.0000 "application " | Freq: Once | CUTANEOUS | Status: DC

## 2019-04-08 MED ORDER — PANTOPRAZOLE SODIUM 40 MG PO TBEC
40.0000 mg | DELAYED_RELEASE_TABLET | Freq: Two times a day (BID) | ORAL | Status: DC
  Administered 2019-04-09 – 2019-04-13 (×8): 40 mg via ORAL
  Filled 2019-04-08 (×8): qty 1

## 2019-04-08 MED ORDER — LEVOTHYROXINE SODIUM 25 MCG PO TABS
125.0000 ug | ORAL_TABLET | Freq: Every day | ORAL | Status: DC
  Administered 2019-04-09 – 2019-04-13 (×5): 125 ug via ORAL
  Filled 2019-04-08 (×5): qty 1

## 2019-04-08 MED ORDER — POLYETHYLENE GLYCOL 3350 17 G PO PACK
17.0000 g | PACK | Freq: Every day | ORAL | Status: DC
  Administered 2019-04-10 – 2019-04-13 (×4): 17 g via ORAL
  Filled 2019-04-08 (×4): qty 1

## 2019-04-08 MED ORDER — ALLOPURINOL 300 MG PO TABS
300.0000 mg | ORAL_TABLET | Freq: Every day | ORAL | Status: DC
  Administered 2019-04-10 – 2019-04-13 (×4): 300 mg via ORAL
  Filled 2019-04-08 (×4): qty 1

## 2019-04-08 MED ORDER — CHLORHEXIDINE GLUCONATE 4 % EX LIQD
60.0000 mL | Freq: Once | CUTANEOUS | Status: AC
  Administered 2019-04-09: 4 via TOPICAL
  Filled 2019-04-08: qty 60

## 2019-04-08 MED ORDER — ENOXAPARIN SODIUM 40 MG/0.4ML ~~LOC~~ SOLN
40.0000 mg | SUBCUTANEOUS | Status: DC
  Administered 2019-04-09 – 2019-04-12 (×4): 40 mg via SUBCUTANEOUS
  Filled 2019-04-08 (×4): qty 0.4

## 2019-04-08 MED ORDER — WHITE PETROLATUM EX OINT
TOPICAL_OINTMENT | CUTANEOUS | Status: AC
  Administered 2019-04-08: 23:00:00
  Filled 2019-04-08: qty 28.35

## 2019-04-08 MED ORDER — FUROSEMIDE 20 MG PO TABS
20.0000 mg | ORAL_TABLET | Freq: Every day | ORAL | Status: DC
  Administered 2019-04-10 – 2019-04-13 (×4): 20 mg via ORAL
  Filled 2019-04-08 (×4): qty 1

## 2019-04-08 MED ORDER — MORPHINE SULFATE (PF) 2 MG/ML IV SOLN: 0.5000 mg | INTRAVENOUS | Status: DC | PRN

## 2019-04-08 MED ORDER — PRAVASTATIN SODIUM 10 MG PO TABS: 10.0000 mg | ORAL_TABLET | ORAL | Status: DC

## 2019-04-08 MED ORDER — GABAPENTIN 300 MG PO CAPS
300.0000 mg | ORAL_CAPSULE | Freq: Once | ORAL | Status: AC
  Administered 2019-04-08: 300 mg via ORAL
  Filled 2019-04-08: qty 1

## 2019-04-08 MED ORDER — CEFAZOLIN SODIUM-DEXTROSE 2-4 GM/100ML-% IV SOLN
2.0000 g | INTRAVENOUS | Status: AC
  Administered 2019-04-09: 2 g via INTRAVENOUS

## 2019-04-08 MED ORDER — CITALOPRAM HYDROBROMIDE 20 MG PO TABS
20.0000 mg | ORAL_TABLET | Freq: Every day | ORAL | Status: DC
  Administered 2019-04-10 – 2019-04-13 (×4): 20 mg via ORAL
  Filled 2019-04-08 (×3): qty 1
  Filled 2019-04-08: qty 2
  Filled 2019-04-08: qty 1

## 2019-04-08 MED ORDER — FENTANYL CITRATE (PF) 100 MCG/2ML IJ SOLN: 25.0000 ug | INTRAMUSCULAR | Status: DC | PRN

## 2019-04-08 MED ORDER — FESOTERODINE FUMARATE ER 8 MG PO TB24
8.0000 mg | ORAL_TABLET | Freq: Every day | ORAL | Status: DC
  Administered 2019-04-08 – 2019-04-12 (×5): 8 mg via ORAL
  Filled 2019-04-08 (×6): qty 1

## 2019-04-08 MED ORDER — ZOLPIDEM TARTRATE 5 MG PO TABS
5.0000 mg | ORAL_TABLET | Freq: Every day | ORAL | Status: DC
  Administered 2019-04-08 – 2019-04-12 (×5): 5 mg via ORAL
  Filled 2019-04-08 (×5): qty 1

## 2019-04-08 NOTE — Consult Note (Signed)
ORTHOPAEDIC CONSULTATION  REQUESTING PHYSICIAN: Norval Morton, MD  Chief Complaint: Fall, right hip pain  HPI: Melinda Kerr is a 84 y.o. female who complains of right hip pain and inability to bear weight after she fell in the early a.m. today.  She presented to the emergency department where x-rays showed a displaced subtrochanteric femur fracture.  Orthopedics was consulted for evaluation  Today she does have significant pain controlled with medicine.  She denies distal paresthesia.  She was previously ambulatory occasionally using a cane.  She lives alone.  Denies history of MI or CVA.  Remote history of DVT approximately 30 years ago.  No PE.  Unable to take oral NSAIDs with previous GI bleed/large hiatal hernia.  Past Medical History:  Diagnosis Date  . Arthritis    "shoulders, knees, lower back" (07/14/2017)  . CHF (congestive heart failure) ([place])    "while in hospital w/hysterectomy in 2017"  . Chronic lower back pain   . Depression   . Diverticulitis of large intestine with abscess 07/13/2017  . DVT (deep venous thrombosis) ([place])   . Dyspnea    "cause I'm over weight"  . Esophageal reflux   . Gout   . Hyperlipidemia    under control  . Hypertension   . Hypothyroidism   . Morbid obesity with BMI of 40.0-44.9, adult ([place])   . Nocturia   . OSA on CPAP    "not wearing it when I sleep in my lift chair" (07/14/2017)  . Osteoarthritis   . Phlebitis    hx of  . Pneumonia ?1989  . Spondylosis    with scoliosis  . Type II diabetes mellitus (HCC)    borderline , diet controlled   . Urinary frequency   . Uterine cancer (HCC)    S/P chemo, radiation, hysterectomy   Past Surgical History:  Procedure Laterality Date  . APPENDECTOMY    . CATARACT EXTRACTION, BILATERAL Bilateral 2018  . COLONOSCOPY    . DILATATION & CURETTAGE/HYSTEROSCOPY WITH MYOSURE N/A 03/04/2016   Procedure: DILATATION & CURETTAGE/HYSTEROSCOPY ;  Surgeon: Azucena Fallen, MD;  Location: [facility];   Service: Gynecology;  Laterality: N/A;  polyp removal   . ESOPHAGOGASTRODUODENOSCOPY (EGD) WITH PROPOFOL N/A 11/14/2018   Procedure: ESOPHAGOGASTRODUODENOSCOPY (EGD) WITH PROPOFOL;  Surgeon: Wonda Horner, MD;  Location: [facility]COPY;  Service: Endoscopy;  Laterality: N/A;  . HERNIA REPAIR    . IR GENERIC HISTORICAL  05/15/2016   IR US GUIDE VASC ACCESS RIGHT 05/15/2016 Aletta Edouard, MD [facility]  . IR GENERIC HISTORICAL  05/15/2016   IR FLUORO GUIDE CV LINE RIGHT 05/15/2016 Aletta Edouard, MD [facility]  . IR REMOVAL TUN ACCESS W/ PORT W/O FL MOD SED  04/19/2018  . LAPAROSCOPIC CHOLECYSTECTOMY  2004  . LAPAROSCOPIC INCISIONAL / UMBILICAL / VENTRAL HERNIA REPAIR  2004   "w/gallbladder OR"  . LYMPH NODE BIOPSY N/A 03/26/2016   Procedure: [place] LYMPH NODE BIOPSY;  Surgeon: Everitt Amber, MD;  Location: [facility];  Service: Gynecology;  Laterality: N/A;  . REVERSE SHOULDER ARTHROPLASTY Right 10/26/2018   Procedure: REVERSE SHOULDER ARTHROPLASTY;  Surgeon: Nicholes Stairs, MD;  Location: [place];  Service: Orthopedics;  Laterality: Right;  . ROBOTIC ASSISTED TOTAL HYSTERECTOMY WITH BILATERAL SALPINGO OOPHERECTOMY N/A 03/26/2016   Procedure: XI ROBOTIC ASSISTED TOTAL Laproscopic HYSTERECTOMY WITH BILATERAL SALPINGO OOPHORECTOMY;  Surgeon: Everitt Amber, MD;  Location: [facility];  Service: Gynecology;  Laterality: N/A;  . TOTAL KNEE ARTHROPLASTY Left 10/09/2013   Procedure:  LEFT TOTAL KNEE ARTHROPLASTY;  Surgeon: [name], MD;  Location: [facility];  Service: Orthopedics;  Laterality: Left;  . TOTAL KNEE ARTHROPLASTY Right 04/09/2014   Procedure: RIGHT TOTAL KNEE ARTHROPLASTY;  Surgeon: [name], MD;  Location: [facility];  Service: Orthopedics;  Laterality: Right;  . TUBAL LIGATION  1982  . VARICOSE VEIN SURGERY Bilateral 1968  . [place] EXTRACTION  1963   Social History   Socioeconomic History  . Marital status: Widowed    Spouse name: Not on file  . Number of children: 0  . Years  of education: Not on file  . Highest education level: Not on file  Occupational History  . 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 Financial resource strain: Not on file  . Food insecurity    Worry: Not on file    Inability: Not on file  . Transportation needs    Medical: Not on file    Non-medical: Not on file  Tobacco Use  . Smoking status: Never Smoker  . Smokeless tobacco: Never Used  Substance and Sexual Activity  . Alcohol use: No  . Drug use: No  . Sexual activity: Never  Lifestyle  . Physical activity    Days per week: Not on file    Minutes per session: Not on file  . Stress: Not on file  Relationships  . Social Herbalist on phone: Not on file    Gets together: Not on file    Attends religious service: Not on file    Active member of club or organization: Not on file    Attends meetings of clubs or organizations: Not on file    Relationship status: Not on file  Other Topics Concern  . Not on file  Social History Narrative  . Not on file   Family History  Problem Relation Age of Onset  . Heart disease Mother   . Heart disease Father   . CVA Father    Allergies  Allergen Reactions  . Oxybutynin Other (See Comments)    Bleeding gums, felt sick   Prior to Admission medications   Medication Sig Start Date End Date Taking? Authorizing Provider  acetaminophen (TYLENOL) 500 MG tablet Take 1,000 mg by mouth 2 (two) times daily.    Yes [provider]  alendronate (FOSAMAX) 70 MG tablet Take 70 mg by mouth every Friday. Take with a full glass of water on an empty stomach.    Yes [provider]  allopurinol (ZYLOPRIM) 300 MG tablet Take 300 mg by mouth daily.    Yes [provider]  calcium gluconate 500 MG tablet Take 1 tablet by mouth daily.    Yes [provider]  cetirizine (ZYRTEC) 10 MG tablet Take 10 mg by mouth daily.   Yes [provider]  citalopram (CELEXA) 20 MG tablet Take 20 mg by  mouth daily.  01/10/15  Yes [provider]  furosemide (LASIX) 20 MG tablet Take 20 mg by mouth daily. 01/10/15  Yes [provider]  hydrOXYzine (ATARAX/VISTARIL) 25 MG tablet Take 25 mg by mouth at bedtime.   Yes [provider]  levothyroxine (SYNTHROID, LEVOTHROID) 125 MCG tablet Take 125 mcg by mouth daily at 6 (six) AM.    Yes [provider]  Melatonin 3 MG TABS Take 3 mg by mouth at bedtime.    Yes [provider]  nystatin (NYSTATIN) powder Apply topically See admin instructions. Apply topically  to abdominal skin folds and under bilateral breast for intertrigo - twice daily - please make sure skin has been cleaned and is dry before applying   Yes [provider]  Omega-3 Fatty Acids (FISH OIL) 1000 MG CAPS Take 1,000 mg by mouth daily.    Yes [provider]  pantoprazole (PROTONIX) 40 MG tablet Take 1 tablet (40 mg total) by mouth 2 (two) times daily before a meal. 11/17/18  Yes Gherghe, Vella Redhead, MD  polyethylene glycol (MIRALAX / GLYCOLAX) packet Take 17 g by mouth daily as needed for mild constipation or moderate constipation. Patient taking differently: Take 17 g by mouth daily.  04/12/14  Yes Perkins, Alexzandrew L, PA-C  pravastatin (PRAVACHOL) 10 MG tablet Take 10 mg by mouth every Thursday. At bedtime 02/14/18  Yes [provider]  pyridOXINE (VITAMIN B-6) 100 MG tablet Take 100 mg by mouth daily.   Yes [provider]  tolterodine (DETROL LA) 4 MG 24 hr capsule Take 4 mg by mouth every evening.    Yes [provider]  traMADol (ULTRAM) 50 MG tablet Take 1 tablet (50 mg total) by mouth every 6 (six) hours as needed (mild pain). Patient taking differently: Take 50 mg by mouth 2 (two) times a day.  11/17/18  Yes Gherghe, Vella Redhead, MD  vitamin B-12 (CYANOCOBALAMIN) 100 MCG tablet Take 100 mcg by mouth daily.    Yes [provider]  vitamin C (ASCORBIC ACID) 500 MG tablet Take 500 mg by mouth  daily.   Yes [provider]  zolpidem (AMBIEN) 10 MG tablet Take 10 mg by mouth at bedtime. 02/17/19  Yes [provider]   Dg Chest 1 View  Result Date: 04/08/2019 CLINICAL DATA:  Fall.  Hip and knee pain. EXAM: CHEST  1 VIEW COMPARISON:  CT scan October 27, 2018. Chest x-ray October 26, 2018. FINDINGS: A masslike opacity containing air along the right side of the heart is consistent with the patient's known large hiatal hernia. No pneumothorax. The lungs are clear. No other acute abnormalities or changes. IMPRESSION: Large hiatal hernia.  No acute abnormalities. Electronically Signed   By: Dorise Bullion III M.D   On: 04/08/2019 14:13   Dg Knee 2 Views Right  Result Date: 04/08/2019 CLINICAL DATA:  Pain after fall EXAM: RIGHT KNEE - 1-2 VIEW COMPARISON:  None. FINDINGS: The patient is status post right knee replacement. Hardware is in good position. No fractures or effusions. IMPRESSION: No acute abnormalities.  Right knee replacement. Electronically Signed   By: Dorise Bullion III M.D   On: 04/08/2019 14:14   Dg Hip Unilat With Pelvis 2-3 Views Right  Result Date: 04/08/2019 CLINICAL DATA:  Pain after fall EXAM: DG HIP (WITH OR WITHOUT PELVIS) 2-3V RIGHT COMPARISON:  None. FINDINGS: There is a comminuted displaced angulated fracture through the proximal right femur including the intertrochanteric region, involving both the greater in inferior trochanters. No evidence of femoral head dislocation. The left hip is intact. The remainder of the pelvic bones are unremarkable. IMPRESSION: Comminuted displaced and angulated fracture through the right proximal femur centered in the intertrochanteric region. Electronically Signed   By: Dorise Bullion III M.D   On: 04/08/2019 14:14    Positive ROS: All other systems have been reviewed and were otherwise negative with the exception of those mentioned in the HPI and as above.  Objective: Labs cbc Recent Labs    04/08/19 1427  WBC  11.2*  HGB 9.9*  HCT 33.8*  PLT 287    Labs inflam No results for input(s): CRP in the last 72 hours.  Invalid input(s): ESR  Labs coag Recent Labs    04/08/19 1427  INR 1.1    Recent Labs    04/08/19 1427  NA 135  K 4.6  CL 102  CO2 25  GLUCOSE 194*  BUN 11  CREATININE 0.87  CALCIUM 9.2    Physical Exam: Vitals:   04/08/19 1700 04/08/19 1952  BP: (!) 156/91 (!) 133/58  Pulse: 84 88  Resp: 16 14  Temp: 98.1 F (36.7 C) 98.4 F (36.9 C)  SpO2: 96% 96%   General: Alert, morbidly obese female in no acute distress.  Calm, talkative. Mental status: Alert and Oriented x3 Neurologic: Speech Clear and organized, no gross focal findings or movement disorder appreciated. Respiratory: No cyanosis, no use of accessory musculature Cardiovascular: No pedal edema GI: Abdomen is soft and non-tender, non-distended. Skin: Warm and dry.  No lesions in the area of chief complaint. Extremities: Warm and well perfused w/o edema Psychiatric: Patient is competent for consent with normal mood and affect  MUSCULOSKELETAL:  Right lower extremity: Leg shortened and externally rotated.  EHL, FHL, dorsiflexion, plantarflexion intact.  Gross sensation intact distally.  Thigh and lateral hip soft compressible. Other extremities are atraumatic with painless ROM and NVI.  Assessment / Plan: Active Problems:   OSA on CPAP   Hypothyroidism   Anemia   Depression   Malignant neoplasm of uterus (HCC)   Hiatal hernia   Displaced comminuted fracture of shaft of right femur, initial encounter for closed fracture ([place])   Closed displaced right subtrochanteric femur fracture  Plan for Operative fixation the morning tomorrow by Dr. Percell Miller -NPO  -Medicine team to admit and perform pre-op clearance -PT/OT post op -Foley okay prn for comfort  -Likely to require Rehab or SNF placement upon discharge.   The risks benefits and alternatives of the procedure were discussed with the patient  including but not limited to infection, bleeding, nerve injury, the need for revision surgery, blood clots, cardiopulmonary complications, morbidity, mortality, among others.  The patient verbalizes understanding and wishes to proceed.  At the patient's request, Dr. Percell Miller also discussed the plan with the patient's niece [name] Yow.  Weightbearing: Bedrest.  Will amend post op. VTE prophylaxis: SCDs.  Will amend postop.  Planning for Lovenox. Pain control: Norco morphine.  Minimize narcotics if able. Follow-up plan: Will follow in acute inpatient post-op phase.  Plan for outpatient follow up with Dr. Alain Marion in about 2 weeks. Contact information:  Edmonia Lynch MD, Roxan Hockey PA-C  Prudencio Burly III PA-C 04/08/2019 9:55 PM

## 2019-04-08 NOTE — Plan of Care (Signed)
  Problem: Pain Managment: Goal: General experience of comfort will improve Outcome: Progressing   Problem: Safety: Goal: Ability to remain free from injury will improve Outcome: Progressing   Problem: Skin Integrity: Goal: Risk for impaired skin integrity will decrease Outcome: Progressing   

## 2019-04-08 NOTE — ED Provider Notes (Signed)
[facility] EMERGENCY DEPARTMENT Provider Note   CSN: [id number] Arrival date & time: 04/08/19  1222     History   Chief Complaint Chief Complaint  Patient presents with  . Fall  . Knee Pain    HPI Melinda Kerr is a 84 y.o. female.     HPI  84 year old female who lives independently who presents today complaining of right knee pain after fall.  She states around 2 AM in the morning.  States that her knee went out from under her.  She was on the floor and was unable to call for help.  Her niece came to check on this morning and subsequently EMS was called and she was transferred here to the hospital.  She denies striking her head or having head pain, neck pain, or chest pain.  She is complaining of pain in the right knee.  She denies being on blood thinner.  She denies fever, chills, COVID symptoms or exposure.  Past Medical History:  Diagnosis Date  . Arthritis    "shoulders, knees, lower back" (07/14/2017)  . CHF (congestive heart failure) ([place])    "while in hospital w/hysterectomy in 2017"  . Chronic lower back pain   . Depression   . Diverticulitis of large intestine with abscess 07/13/2017  . DVT (deep venous thrombosis) ([place])   . Dyspnea    "cause I'm over weight"  . Esophageal reflux   . Gout   . Hyperlipidemia    under control  . Hypertension   . Hypothyroidism   . Morbid obesity with BMI of 40.0-44.9, adult ([place])   . Nocturia   . OSA on CPAP    "not wearing it when I sleep in my lift chair" (07/14/2017)  . Osteoarthritis   . Phlebitis    hx of  . Pneumonia ?1989  . Spondylosis    with scoliosis  . Type II diabetes mellitus (HCC)    borderline , diet controlled   . Urinary frequency   . Uterine cancer (HCC)    S/P chemo, radiation, hysterectomy    Patient Active Problem List   Diagnosis Date Noted  . GIB (gastrointestinal bleeding) 11/13/2018  . Anemia associated with acute blood loss 11/13/2018  . Closed fracture of right  proximal humerus 10/26/2018  . S/P reverse total shoulder arthroplasty, right 10/26/2018  . Acute appendicitis   . Diverticulitis of large intestine with abscess 07/13/2017  . Colitis 07/13/2017  . Aortic atherosclerosis ([place]) 09/12/2016  . Coronary artery calcification 09/12/2016  . Hiatal hernia 09/12/2016  . Lumbar disc disease 09/12/2016  . Chemotherapy-induced thrombocytopenia 08/04/2016  . Chemotherapy induced neutropenia ([place]) 05/26/2016  . Port catheter in place 05/26/2016  . Encounter for antineoplastic chemotherapy 05/26/2016  . Urinary frequency 04/24/2016  . Obstructive sleep apnea on CPAP 04/24/2016  . History of DVT (deep vein thrombosis) 04/24/2016  . Endometrial cancer, FIGO stage IIIC ([place]) 04/24/2016  . Malignant neoplasm of endometrium ([place]) 03/26/2016  . Dyspnea on exertion 03/19/2016  . Malignant neoplasm of uterus ([place]) 03/19/2016  . Status post right knee replacement 04/16/2014  . Essential hypertension 04/16/2014  . Postoperative anemia due to acute blood loss 04/12/2014  . Status post total left knee replacement 10/14/2013  . Thyroid activity decreased 10/14/2013  . UI (urinary incontinence) 10/14/2013  . Dyslipidemia 10/14/2013  . Constipation 10/14/2013  . Gout 10/14/2013  . Anemia 10/14/2013  . Allergic rhinitis 10/14/2013  . Insomnia 10/14/2013  . Depression 10/14/2013  . OA (osteoarthritis) of  knee 10/09/2013  . OSA on CPAP 08/10/2013  . Morbid obesity with BMI of 45.0-49.9, adult [facility])     Past Surgical History:  Procedure Laterality Date  . APPENDECTOMY    . CATARACT EXTRACTION, BILATERAL Bilateral 2018  . COLONOSCOPY    . DILATATION & CURETTAGE/HYSTEROSCOPY WITH MYOSURE N/A 03/04/2016   Procedure: DILATATION & CURETTAGE/HYSTEROSCOPY ;  Surgeon: Azucena Fallen, MD;  Location: [facility];  Service: Gynecology;  Laterality: N/A;  polyp removal   . ESOPHAGOGASTRODUODENOSCOPY (EGD) WITH PROPOFOL N/A 11/14/2018   Procedure: ESOPHAGOGASTRODUODENOSCOPY  (EGD) WITH PROPOFOL;  Surgeon: Wonda Horner, MD;  Location: [facility] ENDOSCOPY;  Service: Endoscopy;  Laterality: N/A;  . HERNIA REPAIR    . IR GENERIC HISTORICAL  05/15/2016   IR US GUIDE VASC ACCESS RIGHT 05/15/2016 Aletta Edouard, MD [facility]  . IR GENERIC HISTORICAL  05/15/2016   IR FLUORO GUIDE CV LINE RIGHT 05/15/2016 Aletta Edouard, MD [facility]  . IR REMOVAL TUN ACCESS W/ PORT W/O FL MOD SED  04/19/2018  . LAPAROSCOPIC CHOLECYSTECTOMY  2004  . LAPAROSCOPIC INCISIONAL / UMBILICAL / VENTRAL HERNIA REPAIR  2004   "w/gallbladder OR"  . LYMPH NODE BIOPSY N/A 03/26/2016   Procedure: [place] LYMPH NODE BIOPSY;  Surgeon: Everitt Amber, MD;  Location: [facility];  Service: Gynecology;  Laterality: N/A;  . REVERSE SHOULDER ARTHROPLASTY Right 10/26/2018   Procedure: REVERSE SHOULDER ARTHROPLASTY;  Surgeon: Nicholes Stairs, MD;  Location: [facility];  Service: Orthopedics;  Laterality: Right;  . ROBOTIC ASSISTED TOTAL HYSTERECTOMY WITH BILATERAL SALPINGO OOPHERECTOMY N/A 03/26/2016   Procedure: XI ROBOTIC ASSISTED TOTAL Laproscopic HYSTERECTOMY WITH BILATERAL SALPINGO OOPHORECTOMY;  Surgeon: Everitt Amber, MD;  Location: [facility];  Service: Gynecology;  Laterality: N/A;  . TOTAL KNEE ARTHROPLASTY Left 10/09/2013   Procedure: LEFT TOTAL KNEE ARTHROPLASTY;  Surgeon: [name], MD;  Location: [facility];  Service: Orthopedics;  Laterality: Left;  . TOTAL KNEE ARTHROPLASTY Right 04/09/2014   Procedure: RIGHT TOTAL KNEE ARTHROPLASTY;  Surgeon: [name], MD;  Location: [facility];  Service: Orthopedics;  Laterality: Right;  . TUBAL LIGATION  1982  . VARICOSE VEIN SURGERY Bilateral 1968  . [place]     OB History   No obstetric history on file.      Home Medications    Prior to Admission medications   Medication Sig Start Date End Date Taking? Authorizing Provider  acetaminophen (TYLENOL) 500 MG tablet Take 1,000 mg by mouth 2 (two) times daily.     [provider]   alendronate (FOSAMAX) 70 MG tablet Take 70 mg by mouth every Wednesday. Take with a full glass of water on an empty stomach.     [provider]  allopurinol (ZYLOPRIM) 300 MG tablet Take 300 mg by mouth daily.     [provider]  calcium gluconate 500 MG tablet Take 1-2 tablets by mouth See admin instructions. Take 1 tablet (500 mg) by mouth every morning and 2 tablets (1000 mg) at bedtime    [provider]  cetirizine (ZYRTEC) 10 MG tablet Take 10 mg by mouth daily.    [provider]  citalopram (CELEXA) 20 MG tablet Take 20 mg by mouth daily.  01/10/15   [provider]  furosemide (LASIX) 20 MG tablet Take 20 mg by mouth daily. 01/10/15   [provider]  hydrOXYzine (ATARAX/VISTARIL) 25 MG tablet Take 25 mg by mouth at bedtime.    [provider]  hydrOXYzine (VISTARIL) 25  MG capsule hydroxyzine pamoate 25 mg capsule    [provider]  levothyroxine (SYNTHROID, LEVOTHROID) 125 MCG tablet Take 125 mcg by mouth daily at 6 (six) AM.     [provider]  Melatonin 3 MG TABS Take 3 mg by mouth at bedtime.     [provider]  nystatin (NYSTATIN) powder Apply topically See admin instructions. Apply topically to abdominal skin folds and under bilateral breast for intertrigo - twice daily - please make sure skin has been cleaned and is dry before applying    [provider]  Omega-3 Fatty Acids (FISH OIL) 1000 MG CAPS Take 1,000 mg by mouth daily.     [provider]  ondansetron (ZOFRAN) 4 MG tablet Take 4 mg by mouth 4 (four) times daily as needed for nausea or vomiting.    [provider]  oxyCODONE-acetaminophen (PERCOCET) 5-325 MG tablet Take 1 tablet by mouth every 4 (four) hours as needed (pain). Patient not taking: Reported on 03/13/2019 11/17/18   Caren Griffins, MD  pantoprazole (PROTONIX) 40 MG tablet Take 1 tablet (40 mg total) by mouth 2 (two) times daily before a meal.  11/17/18   Gherghe, Vella Redhead, MD  polyethylene glycol (MIRALAX / GLYCOLAX) packet Take 17 g by mouth daily as needed for mild constipation or moderate constipation. 04/12/14   Perkins, Alexzandrew L, PA-C  pravastatin (PRAVACHOL) 10 MG tablet Take 10 mg by mouth every Wednesday. At bedtime 02/14/18   [provider]  pyridOXINE (VITAMIN B-6) 100 MG tablet Take 100 mg by mouth daily.    [provider]  rosuvastatin (CRESTOR) 10 MG tablet rosuvastatin 10 mg tablet    [provider]  senna-docusate (SENNA S) 8.6-50 MG tablet Take 2 tablets by mouth daily.    [provider]  tolterodine (DETROL LA) 4 MG 24 hr capsule Take 4 mg by mouth every evening.     [provider]  traMADol (ULTRAM) 50 MG tablet Take 1 tablet (50 mg total) by mouth every 6 (six) hours as needed (mild pain). 11/17/18   Caren Griffins, MD  vitamin B-12 (CYANOCOBALAMIN) 100 MCG tablet Take 100 mcg by mouth daily.     [provider]  vitamin C (ASCORBIC ACID) 500 MG tablet Take 500 mg by mouth daily.    [provider]  Zoster Vaccine Adjuvanted [facility]) injection Shingrix (PF) 50 mcg/0.5 mL intramuscular suspension, kit    [provider]    Family History Family History  Problem Relation Age of Onset  . Heart disease Mother   . Heart disease Father   . CVA Father     Social History Social History   Tobacco Use  . Smoking status: Never Smoker  . Smokeless tobacco: Never Used  Substance Use Topics  . Alcohol use: No  . Drug use: No     Allergies   Oxybutynin   Review of Systems Review of Systems  All other systems reviewed and are negative.    Physical Exam Updated Vital Signs BP (!) 144/78 (BP Location: Right Arm)   Pulse 84   Temp 97.9 F (36.6 C) (Oral)   Resp (!) 21   Ht 1.651 m ('5\' 5"' )   Wt 108.9 kg   SpO2 96%   BMI 39.94 kg/m   Physical Exam Vitals signs and nursing note reviewed.  Constitutional:      General:  She is not in acute distress.    Appearance: Normal appearance. She is  obese. She is not ill-appearing.  HENT:     Head: Normocephalic and atraumatic.     Right Ear: External ear normal.     Left Ear: External ear normal.     Nose: Nose normal.  Eyes:     Extraocular Movements: Extraocular movements intact.     Pupils: Pupils are equal, round, and reactive to light.  Neck:     Musculoskeletal: Normal range of motion and neck supple.  Cardiovascular:     Rate and Rhythm: Normal rate and regular rhythm.     Pulses: Normal pulses.     Heart sounds: Normal heart sounds.  Pulmonary:     Effort: Pulmonary effort is normal.     Breath sounds: Normal breath sounds.  Abdominal:     General: Abdomen is flat.     Palpations: Abdomen is soft.  Musculoskeletal:     Comments: Right lower extremity externally rotated and shortened There is some contusion under on the right and left buttock with right being greater than left Pelvis is stable There is some diffuse tenderness around the right knee but no obvious crepitus No injuries noted distal Foot is normal with pulses and sensation intact  Skin:    Capillary Refill: Capillary refill takes less than 2 seconds.     Coloration: Skin is not jaundiced.     Findings: Bruising present.  Neurological:     General: No focal deficit present.     Mental Status: She is alert and oriented to person, place, and time.  Psychiatric:        Mood and Affect: Mood normal.      ED Treatments / Results  Labs (all labs ordered are listed, but only abnormal results are displayed) Labs Reviewed  SARS CORONAVIRUS 2 (HOSPITAL ORDER, [place] [facility])  CBC WITH DIFFERENTIAL/PLATELET  PROTIME-INR  COMPREHENSIVE METABOLIC PANEL  URINALYSIS, ROUTINE W REFLEX MICROSCOPIC  TYPE AND SCREEN    EKG EKG Interpretation  Date/Time:  Saturday April 08 2019 12:35:14 EDT Ventricular Rate:  84 PR Interval:    QRS Duration: 105 QT Interval:   359 QTC Calculation: 425 R Axis:   6 Text Interpretation:  Normal sinus rhythm with 1st degree A-V block Poor data quality No significant change since last tracing Confirmed by Pattricia Boss [phone]) on 04/08/2019 2:35:17 PM   Radiology Dg Chest 1 View  Result Date: 04/08/2019 CLINICAL DATA:  Fall.  Hip and knee pain. EXAM: CHEST  1 VIEW COMPARISON:  CT scan October 27, 2018. Chest x-Revel Stellmach October 26, 2018. FINDINGS: A masslike opacity containing air along the right side of the heart is consistent with the patient's known large hiatal hernia. No pneumothorax. The lungs are clear. No other acute abnormalities or changes. IMPRESSION: Large hiatal hernia.  No acute abnormalities. Electronically Signed   By: Dorise Bullion III M.D   On: 04/08/2019 14:13   Dg Knee 2 Views Right  Result Date: 04/08/2019 CLINICAL DATA:  Pain after fall EXAM: RIGHT KNEE - 1-2 VIEW COMPARISON:  None. FINDINGS: The patient is status post right knee replacement. Hardware is in good position. No fractures or effusions. IMPRESSION: No acute abnormalities.  Right knee replacement. Electronically Signed   By: Dorise Bullion III M.D   On: 04/08/2019 14:14   Dg Hip Unilat With Pelvis 2-3 Views Right  Result Date: 04/08/2019 CLINICAL DATA:  Pain after fall EXAM: DG HIP (WITH OR WITHOUT PELVIS) 2-3V RIGHT COMPARISON:  None. FINDINGS: There is a comminuted displaced  angulated fracture through the proximal right femur including the intertrochanteric region, involving both the greater in inferior trochanters. No evidence of femoral head dislocation. The left hip is intact. The remainder of the pelvic bones are unremarkable. IMPRESSION: Comminuted displaced and angulated fracture through the right proximal femur centered in the intertrochanteric region. Electronically Signed   By: Dorise Bullion III M.D   On: 04/08/2019 14:14    Procedures Procedures (including critical care time)  Medications Ordered in ED Medications  fentaNYL  (SUBLIMAZE) injection 25 mcg (has no administration in time range)     Initial Impression / Assessment and Plan / ED Course  I have reviewed the triage vital signs and the nursing notes.  Pertinent labs & imaging results that were available during my care of the patient were reviewed by me and considered in my medical decision making (see chart for details).    84 yo female presents today after fall with right hip and knee pain.  Evaluation here reveals right proximal femur to intertroch fx, stable anemia, and hyperglycemia. PMD Dr. Alyson Ingles Plan consult ortho Admit to hospitalist.-discussed with Dr. Tamala Julian and will see for admission Discussed with Dr. Percell Miller and plans repair for am  Final Clinical Impressions(s) / ED Diagnoses   Final diagnoses:  Closed right hip fracture, initial encounter [facility])  Fall, initial encounter    ED Discharge Orders    None       Pattricia Boss, MD 04/08/19 1550

## 2019-04-08 NOTE — ED Triage Notes (Addendum)
Pt brought in by [facility] from home for fall last night between 0200-0300. Pt was on the floor x9 hrs. Pt endorses right knee pain, possible shortening noted to right leg. Pt denies LOC, endorses mechanical fall. Pt states "my knee just gave out and down I went". Pt endorses hx of right knee replacement x5 years ago.

## 2019-04-08 NOTE — H&P (Signed)
History and Physical    Melinda Kerr WJX:[id number] DOB: 01-Jan-1935 DOA: 04/08/2019  Referring MD/NP/PA: Pattricia Boss, MD PCP: Maury Dus, MD  Patient coming from: Home via EMS  Chief Complaint: Fall  I have personally briefly reviewed patient's old medical records in [place] [facility]   HPI: Melinda Kerr is a 84 y.o. female with medical history significant of CHF, HLD, HTN, hypothyroidism, DM type II, endometrial cancer s/p total robotic hysterectomy, obesity, GI bleed, and OSA on CPAP; who presents after having a fall at approximately 2:30 AM this morning complaining of right knee pain.  Patient reports getting up and feeling as though her right knee gave out prior to her falling onto the floor.  She lives alone and normally ambulates with use of a cane.  She does not usually utilize a cane while in the house.  She was unable to get up off the floor after falling and unable to call for help.  Denies any loss of consciousness, trauma to her head, fever, chills, shortness of breath, palpitations, or chest pain.  Her niece came over to check on her late this morning and found her on the floor and called EMS.  Patient had been admitted into the hospital back in February of this year for GI bleed found to have large hiatal hernia with cameron ulcers.  ED Course: Upon admission into the emergency department patient was noted to be afebrile, respiration 21-27, and all other vital signs maintained.  Labs revealed WBC 11.2, hemoglobin 9.9, and glucose 194.  X-rays of her pelvis revealed a communicated and angulated fracture of the proximal left femur.  Patient was given 25 mcg of fentanyl.  TRH called to admit.  Review of Systems  Constitutional: Negative for fever and weight loss.  HENT: Negative for ear discharge and nosebleeds.   Eyes: Negative for photophobia and pain.  Respiratory: Negative for cough and shortness of breath.   Cardiovascular: Negative for chest pain and leg swelling.   Gastrointestinal: Negative for nausea and vomiting.  Genitourinary: Negative for dysuria and urgency.  Musculoskeletal: Positive for back pain, falls, joint pain and myalgias.  Neurological: Negative for focal weakness and loss of consciousness.  Endo/Heme/Allergies: Negative for polydipsia.  Psychiatric/Behavioral: Negative for memory loss and substance abuse.    Past Medical History:  Diagnosis Date  . Arthritis    "shoulders, knees, lower back" (07/14/2017)  . CHF (congestive heart failure) ([place])    "while in hospital w/hysterectomy in 2017"  . Chronic lower back pain   . Depression   . Diverticulitis of large intestine with abscess 07/13/2017  . DVT (deep venous thrombosis) ([place])   . Dyspnea    "cause I'm over weight"  . Esophageal reflux   . Gout   . Hyperlipidemia    under control  . Hypertension   . Hypothyroidism   . Morbid obesity with BMI of 40.0-44.9, adult ([place])   . Nocturia   . OSA on CPAP    "not wearing it when I sleep in my lift chair" (07/14/2017)  . Osteoarthritis   . Phlebitis    hx of  . Pneumonia ?1989  . Spondylosis    with scoliosis  . Type II diabetes mellitus (HCC)    borderline , diet controlled   . Urinary frequency   . Uterine cancer (HCC)    S/P chemo, radiation, hysterectomy    Past Surgical History:  Procedure Laterality Date  . APPENDECTOMY    . CATARACT EXTRACTION, BILATERAL Bilateral  2018  . COLONOSCOPY    . DILATATION & CURETTAGE/HYSTEROSCOPY WITH MYOSURE N/A 03/04/2016   Procedure: DILATATION & CURETTAGE/HYSTEROSCOPY ;  Surgeon: Azucena Fallen, MD;  Location: [facility];  Service: Gynecology;  Laterality: N/A;  polyp removal   . ESOPHAGOGASTRODUODENOSCOPY (EGD) WITH PROPOFOL N/A 11/14/2018   Procedure: ESOPHAGOGASTRODUODENOSCOPY (EGD) WITH PROPOFOL;  Surgeon: Wonda Horner, MD;  Location: [facility];  Service: Endoscopy;  Laterality: N/A;  . HERNIA REPAIR    . IR GENERIC HISTORICAL  05/15/2016   IR US GUIDE VASC ACCESS RIGHT  05/15/2016 Aletta Edouard, MD [facility]  . IR GENERIC HISTORICAL  05/15/2016   IR FLUORO GUIDE CV LINE RIGHT 05/15/2016 Aletta Edouard, MD [facility]  . IR REMOVAL TUN ACCESS W/ PORT W/O FL MOD SED  04/19/2018  . LAPAROSCOPIC CHOLECYSTECTOMY  2004  . LAPAROSCOPIC INCISIONAL / UMBILICAL / VENTRAL HERNIA REPAIR  2004   "w/gallbladder OR"  . LYMPH NODE BIOPSY N/A 03/26/2016   Procedure: [place] LYMPH NODE BIOPSY;  Surgeon: Everitt Amber, MD;  Location: [facility];  Service: Gynecology;  Laterality: N/A;  . REVERSE SHOULDER ARTHROPLASTY Right 10/26/2018   Procedure: REVERSE SHOULDER ARTHROPLASTY;  Surgeon: Nicholes Stairs, MD;  Location: [facility];  Service: Orthopedics;  Laterality: Right;  . ROBOTIC ASSISTED TOTAL HYSTERECTOMY WITH BILATERAL SALPINGO OOPHERECTOMY N/A 03/26/2016   Procedure: XI ROBOTIC ASSISTED TOTAL Laproscopic HYSTERECTOMY WITH BILATERAL SALPINGO OOPHORECTOMY;  Surgeon: Everitt Amber, MD;  Location: [facility];  Service: Gynecology;  Laterality: N/A;  . TOTAL KNEE ARTHROPLASTY Left 10/09/2013   Procedure: LEFT TOTAL KNEE ARTHROPLASTY;  Surgeon: [name], MD;  Location: [facility];  Service: Orthopedics;  Laterality: Left;  . TOTAL KNEE ARTHROPLASTY Right 04/09/2014   Procedure: RIGHT TOTAL KNEE ARTHROPLASTY;  Surgeon: [name], MD;  Location: [facility];  Service: Orthopedics;  Laterality: Right;  . TUBAL LIGATION  1982  . VARICOSE VEIN SURGERY Bilateral 1968  . [place]     reports that she has never smoked. She has never used smokeless tobacco. She reports that she does not drink alcohol or use drugs.  Allergies  Allergen Reactions  . Oxybutynin Other (See Comments)    Bleeding gums, felt sick    Family History  Problem Relation Age of Onset  . Heart disease Mother   . Heart disease Father   . CVA Father     Prior to Admission medications   Medication Sig Start Date End Date Taking? Authorizing Provider  acetaminophen (TYLENOL) 500 MG tablet Take  1,000 mg by mouth 2 (two) times daily.    Yes [provider]  alendronate (FOSAMAX) 70 MG tablet Take 70 mg by mouth every Friday. Take with a full glass of water on an empty stomach.    Yes [provider]  allopurinol (ZYLOPRIM) 300 MG tablet Take 300 mg by mouth daily.    Yes [provider]  calcium gluconate 500 MG tablet Take 1 tablet by mouth daily.    Yes [provider]  cetirizine (ZYRTEC) 10 MG tablet Take 10 mg by mouth daily.   Yes [provider]  citalopram (CELEXA) 20 MG tablet Take 20 mg by mouth daily.  01/10/15  Yes [provider]  furosemide (LASIX) 20 MG tablet Take 20 mg by mouth daily. 01/10/15  Yes [provider]  hydrOXYzine (ATARAX/VISTARIL) 25 MG tablet Take 25 mg by mouth at bedtime.   Yes [provider]  levothyroxine (SYNTHROID, LEVOTHROID) 125 MCG tablet Take  125 mcg by mouth daily at 6 (six) AM.    Yes [provider]  Melatonin 3 MG TABS Take 3 mg by mouth at bedtime.    Yes [provider]  nystatin (NYSTATIN) powder Apply topically See admin instructions. Apply topically to abdominal skin folds and under bilateral breast for intertrigo - twice daily - please make sure skin has been cleaned and is dry before applying   Yes [provider]  Omega-3 Fatty Acids (FISH OIL) 1000 MG CAPS Take 1,000 mg by mouth daily.    Yes [provider]  pantoprazole (PROTONIX) 40 MG tablet Take 1 tablet (40 mg total) by mouth 2 (two) times daily before a meal. 11/17/18  Yes Gherghe, Vella Redhead, MD  polyethylene glycol (MIRALAX / GLYCOLAX) packet Take 17 g by mouth daily as needed for mild constipation or moderate constipation. Patient taking differently: Take 17 g by mouth daily.  04/12/14  Yes Perkins, Alexzandrew L, PA-C  pravastatin (PRAVACHOL) 10 MG tablet Take 10 mg by mouth every Thursday. At bedtime 02/14/18  Yes [provider]  pyridOXINE (VITAMIN B-6) 100 MG  tablet Take 100 mg by mouth daily.   Yes [provider]  tolterodine (DETROL LA) 4 MG 24 hr capsule Take 4 mg by mouth every evening.    Yes [provider]  traMADol (ULTRAM) 50 MG tablet Take 1 tablet (50 mg total) by mouth every 6 (six) hours as needed (mild pain). Patient taking differently: Take 50 mg by mouth 2 (two) times a day.  11/17/18  Yes Gherghe, Vella Redhead, MD  vitamin B-12 (CYANOCOBALAMIN) 100 MCG tablet Take 100 mcg by mouth daily.    Yes [provider]  vitamin C (ASCORBIC ACID) 500 MG tablet Take 500 mg by mouth daily.   Yes [provider]  zolpidem (AMBIEN) 10 MG tablet Take 10 mg by mouth at bedtime. 02/17/19  Yes [provider]    Physical Exam:  Constitutional: Elderly female who appears to be in NAD while laying still Vitals:   04/08/19 1430 04/08/19 1445 04/08/19 1500 04/08/19 1515  BP: (!) 150/73 (!) 141/99 (!) 123/105 130/70  Pulse: 87 83 78 81  Resp: (!) 21 (!) 23 (!) 22 (!) 24  Temp:      TempSrc:      SpO2: 95% 97% (!) 81% 92%  Weight:      Height:       Eyes: PERRL, lids and conjunctivae normal ENMT: Mucous membranes are moist. Posterior pharynx clear of any exudate or lesions.  Neck: normal, supple, no masses, no thyromegaly Respiratory: clear to auscultation bilaterally, no wheezing, no crackles. Normal respiratory effort. No accessory muscle use.  Cardiovascular: Regular rate and rhythm, no murmurs / rubs / gallops. No extremity edema. 2+ pedal pulses. No carotid bruits.  Abdomen: no tenderness, no masses palpated. No hepatosplenomegaly. Bowel sounds positive.  Musculoskeletal: no clubbing / cyanosis.  Right leg is externally rotated and shortened. Skin: Bruising present of the right hip and buttock. Neurologic: CN 2-12 grossly intact. Sensation intact, DTR normal. Strength 5/5 in all 4.  Psychiatric: Normal judgment and insight. Alert and oriented x 3. Normal mood.     Labs on Admission: I have  personally reviewed following labs and imaging studies  CBC: Recent Labs  Lab 04/08/19 1427  WBC 11.2*  NEUTROABS 10.1*  HGB 9.9*  HCT 33.8*  MCV 79.9*  PLT 974   Basic Metabolic Panel: Recent Labs  Lab 04/08/19 1427  NA 135  K 4.6  CL 102  CO2 25  GLUCOSE 194*  BUN 11  CREATININE 0.87  CALCIUM 9.2   GFR: Estimated Creatinine Clearance: 61.2 mL/min (by C-G formula based on SCr of 0.87 mg/dL). Liver Function Tests: Recent Labs  Lab 04/08/19 1427  AST 37  ALT 20  ALKPHOS 108  BILITOT 0.7  PROT 6.3*  ALBUMIN 3.4*   No results for input(s): LIPASE, AMYLASE in the last 168 hours. No results for input(s): AMMONIA in the last 168 hours. Coagulation Profile: Recent Labs  Lab 04/08/19 1427  INR 1.1   Cardiac Enzymes: No results for input(s): CKTOTAL, CKMB, CKMBINDEX, TROPONINI in the last 168 hours. BNP (last 3 results) No results for input(s): PROBNP in the last 8760 hours. HbA1C: No results for input(s): HGBA1C in the last 72 hours. CBG: No results for input(s): GLUCAP in the last 168 hours. Lipid Profile: No results for input(s): CHOL, HDL, LDLCALC, TRIG, CHOLHDL, LDLDIRECT in the last 72 hours. Thyroid Function Tests: No results for input(s): TSH, T4TOTAL, FREET4, T3FREE, THYROIDAB in the last 72 hours. Anemia Panel: No results for input(s): VITAMINB12, FOLATE, FERRITIN, TIBC, IRON, RETICCTPCT in the last 72 hours. Urine analysis:    Component Value Date/Time   COLORURINE YELLOW 04/08/2019 1315   APPEARANCEUR HAZY (A) 04/08/2019 1315   LABSPEC 1.017 04/08/2019 1315   LABSPEC 1.020 05/04/2016 1006   PHURINE 7.0 04/08/2019 1315   GLUCOSEU NEGATIVE 04/08/2019 1315   GLUCOSEU Negative 05/04/2016 1006   HGBUR NEGATIVE 04/08/2019 1315   BILIRUBINUR NEGATIVE 04/08/2019 1315   BILIRUBINUR Negative 05/04/2016 1006   KETONESUR 5 (A) 04/08/2019 1315   PROTEINUR NEGATIVE 04/08/2019 1315   UROBILINOGEN 0.2 05/04/2016 1006   NITRITE NEGATIVE 04/08/2019 1315    LEUKOCYTESUR NEGATIVE 04/08/2019 1315   LEUKOCYTESUR Small 05/04/2016 1006   Sepsis Labs: No results found for this or any previous visit (from the past 240 hour(s)).   Radiological Exams on Admission: Dg Chest 1 View  Result Date: 04/08/2019 CLINICAL DATA:  Fall.  Hip and knee pain. EXAM: CHEST  1 VIEW COMPARISON:  CT scan October 27, 2018. Chest x-ray October 26, 2018. FINDINGS: A masslike opacity containing air along the right side of the heart is consistent with the patient's known large hiatal hernia. No pneumothorax. The lungs are clear. No other acute abnormalities or changes. IMPRESSION: Large hiatal hernia.  No acute abnormalities. Electronically Signed   By: Dorise Bullion III M.D   On: 04/08/2019 14:13   Dg Knee 2 Views Right  Result Date: 04/08/2019 CLINICAL DATA:  Pain after fall EXAM: RIGHT KNEE - 1-2 VIEW COMPARISON:  None. FINDINGS: The patient is status post right knee replacement. Hardware is in good position. No fractures or effusions. IMPRESSION: No acute abnormalities.  Right knee replacement. Electronically Signed   By: Dorise Bullion III M.D   On: 04/08/2019 14:14   Dg Hip Unilat With Pelvis 2-3 Views Right  Result Date: 04/08/2019 CLINICAL DATA:  Pain after fall EXAM: DG HIP (WITH OR WITHOUT PELVIS) 2-3V RIGHT COMPARISON:  None. FINDINGS: There is a comminuted displaced angulated fracture through the proximal right femur including the intertrochanteric region, involving both the greater in inferior trochanters. No evidence of femoral head dislocation. The left hip is intact. The remainder of the pelvic bones are unremarkable. IMPRESSION: Comminuted displaced and angulated fracture through the right proximal femur centered in the intertrochanteric region. Electronically Signed   By: Dorise Bullion III M.D   On: 04/08/2019 14:14  EKG: Independently reviewed.  Normal sinus rhythm 84 bpm  Assessment/Plan Communicated right proximal femur fracture due to fall: Acute.   Patient reports that her right knee gave out leading to her subsequently falling.  Patient was on the floor for approximately 9 hours prior to being found by her niece.  X-rays reveal communicated right proximal fracture.  Dr. Percell Miller of orthopedics was consulted and plans to repair in a.m. -Admit to a MedSurg bed -Hip fracture order set initiated -Nonweightbearing -Add on CPK -N.p.o. after midnight for possible surgical correction in a.m. -Hydrocodone/morphine as needed moderate to severe pain -Patient's niece Kathrynn Humble would like to be updated by orthopedics prior to any surgical procedure -Appreciate orthopedics consultative services, we will follow-up for further recommendations  Leukocytosis: WBC elevated at 11.2.  Suspect secondary to acute fracture.  X-ray and urinalysis negative for signs of infection.  COVID-19 testing also negative. -Recheck CBC in a.m.  Microcytic anemia: Chronic.  Hemoglobin 9.9 which appears improved from previous baseline of 7- 8 g/dL during hospitalization in February. -Recheck CBC in a.m.  Diabetes mellitus type 2: Blood glucose elevated at 198 on admission.  Suspect secondary to acute distress.  Last hemoglobin A1c noted to be 6.2 on 10/26/2018. -Hypoglycemic protocol -CBGs q. before meals with sensitive SSI  Diastolic CHF: Last echocardiogram obtained on 10/28/2018 noting EF of 60 to 65% with grade 2 diastolic dysfunction.  Patient appears to be euvolemic at this time.  Patient on furosemide 20 mg daily. 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  Patient status post adjuvant chemotherapy with 6 cycles of carboplatin and Taxol tassel and vaginal  brachytherapy completed in February 2018.   -Continue outpatient follow-up with Dr. Denman George  Hiatal hernia, GERD, history of GI bleed: Patient was previously admitted into the hospital in February of this year for GI bleed.  EGD revealed large hiatal hernia with Lysbeth Galas ulcers as likely cause of symptoms. -Continue Protonix  DVT prophylaxis: Lovenox Code Status: Full Family Communication: Discussed plan of care with the patient's niece over the phone Disposition Plan: Likely we will need rehab at discharge Consults called: Ortho  Admission status: Inpatient  Norval Morton MD [facility] Pager [phone]   If 7PM-7AM, please contact night-coverage [email or web address] Password [facility]  04/08/2019, 3:33 PM

## 2019-04-08 NOTE — ED Notes (Signed)
ED TO INPATIENT HANDOFF REPORT  ED Nurse Name and Phone #: Clemens Lachman [phone]  S Name/Age/Gender Melinda Kerr 84 y.o. female Room/Bed: 041C/041C  Code Status   Code Status: Full Code  Home/SNF/Other Rehab Patient oriented to: self, place, time and situation Is this baseline? Yes   Triage Complete: Triage complete  Chief Complaint fall  Triage Note Pt brought in by [facility] from home for fall last night between 0200-0300. Pt was on the floor x9 hrs. Pt endorses right knee pain, possible shortening noted to right leg. Pt denies LOC, endorses mechanical fall. Pt states "my knee just gave out and down I went". Pt endorses hx of right knee replacement x5 years ago.   Allergies Allergies  Allergen Reactions  . Oxybutynin Other (See Comments)    Bleeding gums, felt sick    Level of Care/Admitting Diagnosis ED Disposition    ED Disposition Condition [facility]: [place] [[id number]]  Level of Care: Med-Surg [16]  Covid Evaluation: Confirmed COVID Negative  Diagnosis: Displaced comminuted fracture of shaft of right femur, initial encounter for closed fracture [facility]) [[id number]]  Admitting Physician: Norval Morton [[id number]]  Attending Physician: Norval Morton [[id number]]  Estimated length of stay: past midnight tomorrow  Certification:: I certify this patient will need inpatient services for at least 2 midnights  PT Class (Do Not Modify): Inpatient [101]  PT Acc Code (Do Not Modify): Private [1]       B Medical/Surgery History Past Medical History:  Diagnosis Date  . Arthritis    "shoulders, knees, lower back" (07/14/2017)  . CHF (congestive heart failure) ([place])    "while in hospital w/hysterectomy in 2017"  . Chronic lower back pain   . Depression   . Diverticulitis of large intestine with abscess 07/13/2017  . DVT (deep venous thrombosis) ([place])   . Dyspnea    "cause I'm over weight"  . Esophageal reflux   . Gout   .  Hyperlipidemia    under control  . Hypertension   . Hypothyroidism   . Morbid obesity with BMI of 40.0-44.9, adult ([place])   . Nocturia   . OSA on CPAP    "not wearing it when I sleep in my lift chair" (07/14/2017)  . Osteoarthritis   . Phlebitis    hx of  . Pneumonia ?1989  . Spondylosis    with scoliosis  . Type II diabetes mellitus (HCC)    borderline , diet controlled   . Urinary frequency   . Uterine cancer (HCC)    S/P chemo, radiation, hysterectomy   Past Surgical History:  Procedure Laterality Date  . APPENDECTOMY    . CATARACT EXTRACTION, BILATERAL Bilateral 2018  . COLONOSCOPY    . DILATATION & CURETTAGE/HYSTEROSCOPY WITH MYOSURE N/A 03/04/2016   Procedure: DILATATION & CURETTAGE/HYSTEROSCOPY ;  Surgeon: Azucena Fallen, MD;  Location: [facility];  Service: Gynecology;  Laterality: N/A;  polyp removal   . ESOPHAGOGASTRODUODENOSCOPY (EGD) WITH PROPOFOL N/A 11/14/2018   Procedure: ESOPHAGOGASTRODUODENOSCOPY (EGD) WITH PROPOFOL;  Surgeon: Wonda Horner, MD;  Location: [facility];  Service: Endoscopy;  Laterality: N/A;  . HERNIA REPAIR    . IR GENERIC HISTORICAL  05/15/2016   IR US GUIDE VASC ACCESS RIGHT 05/15/2016 Aletta Edouard, MD [facility]  . IR GENERIC HISTORICAL  05/15/2016   IR FLUORO GUIDE CV LINE RIGHT 05/15/2016 Aletta Edouard, MD [facility]  . IR REMOVAL TUN ACCESS W/ PORT W/O FL MOD SED  04/19/2018  .  LAPAROSCOPIC CHOLECYSTECTOMY  2004  . LAPAROSCOPIC INCISIONAL / UMBILICAL / VENTRAL HERNIA REPAIR  2004   "w/gallbladder OR"  . LYMPH NODE BIOPSY N/A 03/26/2016   Procedure: [place] LYMPH NODE BIOPSY;  Surgeon: Everitt Amber, MD;  Location: [facility];  Service: Gynecology;  Laterality: N/A;  . REVERSE SHOULDER ARTHROPLASTY Right 10/26/2018   Procedure: REVERSE SHOULDER ARTHROPLASTY;  Surgeon: Nicholes Stairs, MD;  Location: [facility];  Service: Orthopedics;  Laterality: Right;  . ROBOTIC ASSISTED TOTAL HYSTERECTOMY WITH BILATERAL SALPINGO OOPHERECTOMY N/A 03/26/2016    Procedure: XI ROBOTIC ASSISTED TOTAL Laproscopic HYSTERECTOMY WITH BILATERAL SALPINGO OOPHORECTOMY;  Surgeon: Everitt Amber, MD;  Location: [facility];  Service: Gynecology;  Laterality: N/A;  . TOTAL KNEE ARTHROPLASTY Left 10/09/2013   Procedure: LEFT TOTAL KNEE ARTHROPLASTY;  Surgeon: [name], MD;  Location: [facility];  Service: Orthopedics;  Laterality: Left;  . TOTAL KNEE ARTHROPLASTY Right 04/09/2014   Procedure: RIGHT TOTAL KNEE ARTHROPLASTY;  Surgeon: [name], MD;  Location: [facility];  Service: Orthopedics;  Laterality: Right;  . TUBAL LIGATION  1982  . VARICOSE VEIN SURGERY Bilateral 1968  . [place]     A IV Location/Drains/Wounds Patient Lines/Drains/Airways Status   Active Line/Drains/Airways    Name:   Placement date:   Placement time:   Site:   Days:   Peripheral IV 04/08/19 Right Hand   04/08/19    -    Hand   less than 1   Peripheral IV 04/08/19 Right Antecubital   04/08/19    1426    Antecubital   less than 1   External Urinary Catheter   04/08/19    1337    -   less than 1   Incision (Closed) 10/26/18 Shoulder Right   10/26/18    1808     164   Incision - 5 Ports Abdomen Umbilicus Upper;Mid Left;Medial;Mid Left;Lateral;Mid Right;Medial;Mid   03/26/16    1140     1108          Intake/Output Last 24 hours No intake or output data in the 24 hours ending 04/08/19 1602  Labs/Imaging Results for orders placed or performed during the hospital encounter of 04/08/19 (from the past 48 hour(s))  Urinalysis, Routine w reflex microscopic     Status: Abnormal   Collection Time: 04/08/19  1:15 PM  Result Value Ref Range   Color, Urine YELLOW YELLOW   APPearance HAZY (A) CLEAR   Specific Gravity, Urine 1.017 1.005 - 1.030   pH 7.0 5.0 - 8.0   Glucose, UA NEGATIVE NEGATIVE mg/dL   Hgb urine dipstick NEGATIVE NEGATIVE   Bilirubin Urine NEGATIVE NEGATIVE   Ketones, ur 5 (A) NEGATIVE mg/dL   Protein, ur NEGATIVE NEGATIVE mg/dL   Nitrite NEGATIVE NEGATIVE    Leukocytes,Ua NEGATIVE NEGATIVE    Comment: Performed at [facility], [address], [place], [place] [address]  CBC WITH DIFFERENTIAL     Status: Abnormal   Collection Time: 04/08/19  2:27 PM  Result Value Ref Range   WBC 11.2 (H) 4.0 - 10.5 K/uL   RBC 4.23 3.87 - 5.11 MIL/uL   Hemoglobin 9.9 (L) 12.0 - 15.0 g/dL   HCT 33.8 (L) 36.0 - 46.0 %   MCV 79.9 (L) 80.0 - 100.0 fL   MCH 23.4 (L) 26.0 - 34.0 pg   MCHC 29.3 (L) 30.0 - 36.0 g/dL   RDW 18.6 (H) 11.5 - 15.5 %   Platelets 287 150 -  400 K/uL   nRBC 0.0 0.0 - 0.2 %   Neutrophils Relative % 89 %   Neutro Abs 10.1 (H) 1.7 - 7.7 K/uL   Lymphocytes Relative 4 %   Lymphs Abs 0.4 (L) 0.7 - 4.0 K/uL   Monocytes Relative 6 %   Monocytes Absolute 0.7 0.1 - 1.0 K/uL   Eosinophils Relative 0 %   Eosinophils Absolute 0.0 0.0 - 0.5 K/uL   Basophils Relative 0 %   Basophils Absolute 0.0 0.0 - 0.1 K/uL   Immature Granulocytes 1 %   Abs Immature Granulocytes 0.06 0.00 - 0.07 K/uL    Comment: Performed at [facility] [address], [place], [place] [address]  Protime-INR     Status: None   Collection Time: 04/08/19  2:27 PM  Result Value Ref Range   Prothrombin Time 14.4 11.4 - 15.2 seconds   INR 1.1 0.8 - 1.2    Comment: (NOTE) INR goal varies based on device and disease states. Performed at [facility], [place] [address], [place], [place] [address]   Type and screen [place]     Status: None   Collection Time: 04/08/19  2:27 PM  Result Value Ref Range   ABO/RH(D) A NEG    Antibody Screen NEG    Sample Expiration      04/11/2019,2359 Performed at [facility], [place] [address], [place], [place] [address]   Comprehensive metabolic panel     Status: Abnormal   Collection Time: 04/08/19  2:27 PM  Result Value Ref Range   Sodium 135 135 - 145 mmol/L   Potassium 4.6 3.5 - 5.1 mmol/L   Chloride 102 98 - 111 mmol/L   CO2 25 22 - 32 mmol/L   Glucose, Bld 194 (H) 70 - 99 mg/dL   BUN 11 8  - 23 mg/dL   Creatinine, Ser 0.87 0.44 - 1.00 mg/dL   Calcium 9.2 8.9 - 10.3 mg/dL   Total Protein 6.3 (L) 6.5 - 8.1 g/dL   Albumin 3.4 (L) 3.5 - 5.0 g/dL   AST 37 15 - 41 U/L   ALT 20 0 - 44 U/L   Alkaline Phosphatase 108 38 - 126 U/L   Total Bilirubin 0.7 0.3 - 1.2 mg/dL   GFR calc non Af Amer >60 >60 mL/min   GFR calc Af Amer >60 >60 mL/min   Anion gap 8 5 - 15    Comment: Performed at [facility] [address], [place], [place] [address]  SARS Coronavirus 2 (CEPHEID - Performed in [place] [place] [facility] lab), Hosp Order     Status: None   Collection Time: 04/08/19  2:27 PM   Specimen: Nasopharyngeal Swab  Result Value Ref Range   SARS Coronavirus 2 NEGATIVE NEGATIVE    Comment: (NOTE) If result is NEGATIVE SARS-CoV-2 target nucleic acids are NOT DETECTED. The SARS-CoV-2 RNA is generally detectable in upper and lower  respiratory specimens during the acute phase of infection. The lowest  concentration of SARS-CoV-2 viral copies this assay can detect is 250  copies / mL. A negative result does not preclude SARS-CoV-2 infection  and should not be used as the sole basis for treatment or other  patient management decisions.  A negative result may occur with  improper specimen collection / handling, submission of specimen other  than nasopharyngeal swab, presence of viral mutation(s) within the  areas targeted by this assay, and inadequate number of viral copies  (<  250 copies / mL). A negative result must be combined with clinical  observations, patient history, and epidemiological information. If result is POSITIVE SARS-CoV-2 target nucleic acids are DETECTED. The SARS-CoV-2 RNA is generally detectable in upper and lower  respiratory specimens dur ing the acute phase of infection.  Positive  results are indicative of active infection with SARS-CoV-2.  Clinical  correlation with patient history and other diagnostic information is  necessary to determine patient infection  status.  Positive results do  not rule out bacterial infection or co-infection with other viruses. If result is PRESUMPTIVE POSTIVE SARS-CoV-2 nucleic acids MAY BE PRESENT.   A presumptive positive result was obtained on the submitted specimen  and confirmed on repeat testing.  While 2019 novel coronavirus  (SARS-CoV-2) nucleic acids may be present in the submitted sample  additional confirmatory testing may be necessary for epidemiological  and / or clinical management purposes  to differentiate between  SARS-CoV-2 and other Sarbecovirus currently known to infect humans.  If clinically indicated additional testing with an alternate test  methodology [phone]) is advised. The SARS-CoV-2 RNA is generally  detectable in upper and lower respiratory sp ecimens during the acute  phase of infection. The expected result is Negative. Fact Sheet for Patients:  StrictlyIdeas.no Fact Sheet for Healthcare Providers: BankingDealers.co.za This test is not yet approved or cleared by the [place] FDA and has been authorized for detection and/or diagnosis of SARS-CoV-2 by FDA under an Emergency Use Authorization (EUA).  This EUA will remain in effect (meaning this test can be used) for the duration of the COVID-19 declaration under Section 564(b)(1) of the Act, 21 U.S.C. section 360bbb-3(b)(1), unless the authorization is terminated or revoked sooner. Performed at [facility], [address] [place], [place] [address]    Dg Chest 1 View  Result Date: 04/08/2019 CLINICAL DATA:  Fall.  Hip and knee pain. EXAM: CHEST  1 VIEW COMPARISON:  CT scan October 27, 2018. Chest x-ray October 26, 2018. FINDINGS: A masslike opacity containing air along the right side of the heart is consistent with the patient's known large hiatal hernia. No pneumothorax. The lungs are clear. No other acute abnormalities or changes. IMPRESSION: Large hiatal hernia.  No  acute abnormalities. Electronically Signed   By: Dorise Bullion III M.D   On: 04/08/2019 14:13   Dg Knee 2 Views Right  Result Date: 04/08/2019 CLINICAL DATA:  Pain after fall EXAM: RIGHT KNEE - 1-2 VIEW COMPARISON:  None. FINDINGS: The patient is status post right knee replacement. Hardware is in good position. No fractures or effusions. IMPRESSION: No acute abnormalities.  Right knee replacement. Electronically Signed   By: Dorise Bullion III M.D   On: 04/08/2019 14:14   Dg Hip Unilat With Pelvis 2-3 Views Right  Result Date: 04/08/2019 CLINICAL DATA:  Pain after fall EXAM: DG HIP (WITH OR WITHOUT PELVIS) 2-3V RIGHT COMPARISON:  None. FINDINGS: There is a comminuted displaced angulated fracture through the proximal right femur including the intertrochanteric region, involving both the greater in inferior trochanters. No evidence of femoral head dislocation. The left hip is intact. The remainder of the pelvic bones are unremarkable. IMPRESSION: Comminuted displaced and angulated fracture through the right proximal femur centered in the intertrochanteric region. Electronically Signed   By: Dorise Bullion III M.D   On: 04/08/2019 14:14    Pending Labs Unresulted Labs (From admission, onward)    Start     Ordered   04/09/19 0500  Type and screen  [place]  Once,   R    Comments: [place]    04/08/19 1553   04/09/19 2423  Basic metabolic panel  Tomorrow morning,   R     04/08/19 1553   04/09/19 0500  CBC  Tomorrow morning,   R     04/08/19 1553          Vitals/Pain Today's Vitals   04/08/19 1430 04/08/19 1445 04/08/19 1500 04/08/19 1515  BP: (!) 150/73 (!) 141/99 (!) 123/105 130/70  Pulse: 87 83 78 81  Resp: (!) 21 (!) 23 (!) 22 (!) 24  Temp:      TempSrc:      SpO2: 95% 97% (!) 81% 92%  Weight:      Height:      PainSc:        Isolation Precautions No active isolations  Medications Medications  allopurinol (ZYLOPRIM) tablet 300 mg  (has no administration in time range)  Melatonin TABS 3 mg (has no administration in time range)  polyethylene glycol (MIRALAX / GLYCOLAX) packet 17 g (has no administration in time range)  pantoprazole (PROTONIX) EC tablet 40 mg (has no administration in time range)  fesoterodine (TOVIAZ) tablet 8 mg (has no administration in time range)  levothyroxine (SYNTHROID) tablet 125 mcg (has no administration in time range)  zolpidem (AMBIEN) tablet 10 mg (has no administration in time range)  hydrOXYzine (ATARAX/VISTARIL) tablet 25 mg (has no administration in time range)  citalopram (CELEXA) tablet 20 mg (has no administration in time range)  furosemide (LASIX) tablet 20 mg (has no administration in time range)  pravastatin (PRAVACHOL) tablet 10 mg (has no administration in time range)  HYDROcodone-acetaminophen (NORCO/VICODIN) 5-325 MG per tablet 1-2 tablet (has no administration in time range)  morphine 2 MG/ML injection 0.5 mg (has no administration in time range)  enoxaparin (LOVENOX) injection 40 mg (has no administration in time range)    Mobility walks with device High fall risk        R Recommendations: See Admitting Provider Note  Report given to:   Additional Notes:

## 2019-04-08 NOTE — Progress Notes (Signed)
Patient refused CPAP for tonight. RT instructed patient to have RT called if she changes her mind. RT will monitor as needed. ?

## 2019-04-08 NOTE — H&P (View-Only) (Signed)
ORTHOPAEDIC CONSULTATION  REQUESTING PHYSICIAN: Norval Morton, MD  Chief Complaint: Fall, right hip pain  HPI: Melinda Kerr is a 84 y.o. female who complains of right hip pain and inability to bear weight after she fell in the early a.m. today.  She presented to the emergency department where x-rays showed a displaced subtrochanteric femur fracture.  Orthopedics was consulted for evaluation  Today she does have significant pain controlled with medicine.  She denies distal paresthesia.  She was previously ambulatory occasionally using a cane.  She lives alone.  Denies history of MI or CVA.  Remote history of DVT approximately 30 years ago.  No PE.  Unable to take oral NSAIDs with previous GI bleed/large hiatal hernia.  Past Medical History:  Diagnosis Date  . Arthritis    "shoulders, knees, lower back" (07/14/2017)  . CHF (congestive heart failure) ([place])    "while in hospital w/hysterectomy in 2017"  . Chronic lower back pain   . Depression   . Diverticulitis of large intestine with abscess 07/13/2017  . DVT (deep venous thrombosis) ([place])   . Dyspnea    "cause I'm over weight"  . Esophageal reflux   . Gout   . Hyperlipidemia    under control  . Hypertension   . Hypothyroidism   . Morbid obesity with BMI of 40.0-44.9, adult ([place])   . Nocturia   . OSA on CPAP    "not wearing it when I sleep in my lift chair" (07/14/2017)  . Osteoarthritis   . Phlebitis    hx of  . Pneumonia ?1989  . Spondylosis    with scoliosis  . Type II diabetes mellitus (HCC)    borderline , diet controlled   . Urinary frequency   . Uterine cancer (HCC)    S/P chemo, radiation, hysterectomy   Past Surgical History:  Procedure Laterality Date  . APPENDECTOMY    . CATARACT EXTRACTION, BILATERAL Bilateral 2018  . COLONOSCOPY    . DILATATION & CURETTAGE/HYSTEROSCOPY WITH MYOSURE N/A 03/04/2016   Procedure: DILATATION & CURETTAGE/HYSTEROSCOPY ;  Surgeon: Azucena Fallen, MD;  Location: [facility];   Service: Gynecology;  Laterality: N/A;  polyp removal   . ESOPHAGOGASTRODUODENOSCOPY (EGD) WITH PROPOFOL N/A 11/14/2018   Procedure: ESOPHAGOGASTRODUODENOSCOPY (EGD) WITH PROPOFOL;  Surgeon: Wonda Horner, MD;  Location: [facility] ENDOSCOPY;  Service: Endoscopy;  Laterality: N/A;  . HERNIA REPAIR    . IR GENERIC HISTORICAL  05/15/2016   IR US GUIDE VASC ACCESS RIGHT 05/15/2016 Aletta Edouard, MD [facility]  . IR GENERIC HISTORICAL  05/15/2016   IR FLUORO GUIDE CV LINE RIGHT 05/15/2016 Aletta Edouard, MD [facility]  . IR REMOVAL TUN ACCESS W/ PORT W/O FL MOD SED  04/19/2018  . LAPAROSCOPIC CHOLECYSTECTOMY  2004  . LAPAROSCOPIC INCISIONAL / UMBILICAL / VENTRAL HERNIA REPAIR  2004   "w/gallbladder OR"  . LYMPH NODE BIOPSY N/A 03/26/2016   Procedure: Sentinel LYMPH NODE BIOPSY;  Surgeon: Everitt Amber, MD;  Location: [facility];  Service: Gynecology;  Laterality: N/A;  . REVERSE SHOULDER ARTHROPLASTY Right 10/26/2018   Procedure: REVERSE SHOULDER ARTHROPLASTY;  Surgeon: Nicholes Stairs, MD;  Location: [place];  Service: Orthopedics;  Laterality: Right;  . ROBOTIC ASSISTED TOTAL HYSTERECTOMY WITH BILATERAL SALPINGO OOPHERECTOMY N/A 03/26/2016   Procedure: XI ROBOTIC ASSISTED TOTAL Laproscopic HYSTERECTOMY WITH BILATERAL SALPINGO OOPHORECTOMY;  Surgeon: Everitt Amber, MD;  Location: [facility];  Service: Gynecology;  Laterality: N/A;  . TOTAL KNEE ARTHROPLASTY Left 10/09/2013   Procedure:  LEFT TOTAL KNEE ARTHROPLASTY;  Surgeon: Frank V Aluisio, MD;  Location: [facility];  Service: Orthopedics;  Laterality: Left;  . TOTAL KNEE ARTHROPLASTY Right 04/09/2014   Procedure: RIGHT TOTAL KNEE ARTHROPLASTY;  Surgeon: Frank Aluisio V, MD;  Location: [facility];  Service: Orthopedics;  Laterality: Right;  . TUBAL LIGATION  1982  . VARICOSE VEIN SURGERY Bilateral 1968  . WISDOM TOOTH EXTRACTION  1963   Social History   Socioeconomic History  . Marital status: Widowed    Spouse name: Not on file  . Number of children: 0  . Years  of education: Not on file  . Highest education level: Not on file  Occupational History  . Occupation: Tax collection retired  Social Needs  . Financial resource strain: Not on file  . Food insecurity    Worry: Not on file    Inability: Not on file  . Transportation needs    Medical: Not on file    Non-medical: Not on file  Tobacco Use  . Smoking status: Never Smoker  . Smokeless tobacco: Never Used  Substance and Sexual Activity  . Alcohol use: No  . Drug use: No  . Sexual activity: Never  Lifestyle  . Physical activity    Days per week: Not on file    Minutes per session: Not on file  . Stress: Not on file  Relationships  . Social connections    Talks on phone: Not on file    Gets together: Not on file    Attends religious service: Not on file    Active member of club or organization: Not on file    Attends meetings of clubs or organizations: Not on file    Relationship status: Not on file  Other Topics Concern  . Not on file  Social History Narrative  . Not on file   Family History  Problem Relation Age of Onset  . Heart disease Mother   . Heart disease Father   . CVA Father    Allergies  Allergen Reactions  . Oxybutynin Other (See Comments)    Bleeding gums, felt sick   Prior to Admission medications   Medication Sig Start Date End Date Taking? Authorizing Provider  acetaminophen (TYLENOL) 500 MG tablet Take 1,000 mg by mouth 2 (two) times daily.    Yes [provider]  alendronate (FOSAMAX) 70 MG tablet Take 70 mg by mouth every Friday. Take with a full glass of water on an empty stomach.    Yes [provider]  allopurinol (ZYLOPRIM) 300 MG tablet Take 300 mg by mouth daily.    Yes [provider]  calcium gluconate 500 MG tablet Take 1 tablet by mouth daily.    Yes [provider]  cetirizine (ZYRTEC) 10 MG tablet Take 10 mg by mouth daily.   Yes [provider]  citalopram (CELEXA) 20 MG tablet Take 20 mg by  mouth daily.  01/10/15  Yes [provider]  furosemide (LASIX) 20 MG tablet Take 20 mg by mouth daily. 01/10/15  Yes [provider]  hydrOXYzine (ATARAX/VISTARIL) 25 MG tablet Take 25 mg by mouth at bedtime.   Yes [provider]  levothyroxine (SYNTHROID, LEVOTHROID) 125 MCG tablet Take 125 mcg by mouth daily at 6 (six) AM.    Yes [provider]  Melatonin 3 MG TABS Take 3 mg by mouth at bedtime.    Yes [provider]  nystatin (NYSTATIN) powder Apply topically See admin instructions. Apply topically   to abdominal skin folds and under bilateral breast for intertrigo - twice daily - please make sure skin has been cleaned and is dry before applying   Yes [provider]  Omega-3 Fatty Acids (FISH OIL) 1000 MG CAPS Take 1,000 mg by mouth daily.    Yes [provider]  pantoprazole (PROTONIX) 40 MG tablet Take 1 tablet (40 mg total) by mouth 2 (two) times daily before a meal. 11/17/18  Yes Gherghe, Vella Redhead, MD  polyethylene glycol (MIRALAX / GLYCOLAX) packet Take 17 g by mouth daily as needed for mild constipation or moderate constipation. Patient taking differently: Take 17 g by mouth daily.  04/12/14  Yes Perkins, Alexzandrew L, PA-C  pravastatin (PRAVACHOL) 10 MG tablet Take 10 mg by mouth every Thursday. At bedtime 02/14/18  Yes [provider]  pyridOXINE (VITAMIN B-6) 100 MG tablet Take 100 mg by mouth daily.   Yes [provider]  tolterodine (DETROL LA) 4 MG 24 hr capsule Take 4 mg by mouth every evening.    Yes [provider]  traMADol (ULTRAM) 50 MG tablet Take 1 tablet (50 mg total) by mouth every 6 (six) hours as needed (mild pain). Patient taking differently: Take 50 mg by mouth 2 (two) times a day.  11/17/18  Yes Gherghe, Vella Redhead, MD  vitamin B-12 (CYANOCOBALAMIN) 100 MCG tablet Take 100 mcg by mouth daily.    Yes [provider]  vitamin C (ASCORBIC ACID) 500 MG tablet Take 500 mg by mouth  daily.   Yes [provider]  zolpidem (AMBIEN) 10 MG tablet Take 10 mg by mouth at bedtime. 02/17/19  Yes [provider]   Dg Chest 1 View  Result Date: 04/08/2019 CLINICAL DATA:  Fall.  Hip and knee pain. EXAM: CHEST  1 VIEW COMPARISON:  CT scan October 27, 2018. Chest x-ray October 26, 2018. FINDINGS: A masslike opacity containing air along the right side of the heart is consistent with the patient's known large hiatal hernia. No pneumothorax. The lungs are clear. No other acute abnormalities or changes. IMPRESSION: Large hiatal hernia.  No acute abnormalities. Electronically Signed   By: Dorise Bullion III M.D   On: 04/08/2019 14:13   Dg Knee 2 Views Right  Result Date: 04/08/2019 CLINICAL DATA:  Pain after fall EXAM: RIGHT KNEE - 1-2 VIEW COMPARISON:  None. FINDINGS: The patient is status post right knee replacement. Hardware is in good position. No fractures or effusions. IMPRESSION: No acute abnormalities.  Right knee replacement. Electronically Signed   By: Dorise Bullion III M.D   On: 04/08/2019 14:14   Dg Hip Unilat With Pelvis 2-3 Views Right  Result Date: 04/08/2019 CLINICAL DATA:  Pain after fall EXAM: DG HIP (WITH OR WITHOUT PELVIS) 2-3V RIGHT COMPARISON:  None. FINDINGS: There is a comminuted displaced angulated fracture through the proximal right femur including the intertrochanteric region, involving both the greater in inferior trochanters. No evidence of femoral head dislocation. The left hip is intact. The remainder of the pelvic bones are unremarkable. IMPRESSION: Comminuted displaced and angulated fracture through the right proximal femur centered in the intertrochanteric region. Electronically Signed   By: Dorise Bullion III M.D   On: 04/08/2019 14:14    Positive ROS: All other systems have been reviewed and were otherwise negative with the exception of those mentioned in the HPI and as above.  Objective: Labs cbc Recent Labs    04/08/19 1427  WBC  11.2*  HGB 9.9*  HCT 33.8*  PLT 287    Labs inflam No results for input(s): CRP in the last 72 hours.  Invalid input(s): ESR  Labs coag Recent Labs    04/08/19 1427  INR 1.1    Recent Labs    04/08/19 1427  NA 135  K 4.6  CL 102  CO2 25  GLUCOSE 194*  BUN 11  CREATININE 0.87  CALCIUM 9.2    Physical Exam: Vitals:   04/08/19 1700 04/08/19 1952  BP: (!) 156/91 (!) 133/58  Pulse: 84 88  Resp: 16 14  Temp: 98.1 F (36.7 C) 98.4 F (36.9 C)  SpO2: 96% 96%   General: Alert, morbidly obese female in no acute distress.  Calm, talkative. Mental status: Alert and Oriented x3 Neurologic: Speech Clear and organized, no gross focal findings or movement disorder appreciated. Respiratory: No cyanosis, no use of accessory musculature Cardiovascular: No pedal edema GI: Abdomen is soft and non-tender, non-distended. Skin: Warm and dry.  No lesions in the area of chief complaint. Extremities: Warm and well perfused w/o edema Psychiatric: Patient is competent for consent with normal mood and affect  MUSCULOSKELETAL:  Right lower extremity: Leg shortened and externally rotated.  EHL, FHL, dorsiflexion, plantarflexion intact.  Gross sensation intact distally.  Thigh and lateral hip soft compressible. Other extremities are atraumatic with painless ROM and NVI.  Assessment / Plan: Active Problems:   OSA on CPAP   Hypothyroidism   Anemia   Depression   Malignant neoplasm of uterus (HCC)   Hiatal hernia   Displaced comminuted fracture of shaft of right femur, initial encounter for closed fracture ([place])   Closed displaced right subtrochanteric femur fracture  Plan for Operative fixation the morning tomorrow by Dr. Percell Miller -NPO  -Medicine team to admit and perform pre-op clearance -PT/OT post op -Foley okay prn for comfort  -Likely to require Rehab or SNF placement upon discharge.   The risks benefits and alternatives of the procedure were discussed with the patient  including but not limited to infection, bleeding, nerve injury, the need for revision surgery, blood clots, cardiopulmonary complications, morbidity, mortality, among others.  The patient verbalizes understanding and wishes to proceed.  At the patient's request, Dr. Percell Miller also discussed the plan with the patient's niece [name] Yow.  Weightbearing: Bedrest.  Will amend post op. VTE prophylaxis: SCDs.  Will amend postop.  Planning for Lovenox. Pain control: Norco morphine.  Minimize narcotics if able. Follow-up plan: Will follow in acute inpatient post-op phase.  Plan for outpatient follow up with Dr. Alain Marion in about 2 weeks. Contact information:  Edmonia Lynch MD, Roxan Hockey PA-C  Prudencio Burly III PA-C 04/08/2019 9:55 PM

## 2019-04-08 NOTE — ED Notes (Signed)
Patient transported to X-ray 

## 2019-04-09 ENCOUNTER — Encounter (HOSPITAL_COMMUNITY): Payer: Self-pay | Admitting: Anesthesiology

## 2019-04-09 ENCOUNTER — Inpatient Hospital Stay (HOSPITAL_COMMUNITY): Payer: Medicare Other | Admitting: Certified Registered Nurse Anesthetist

## 2019-04-09 ENCOUNTER — Inpatient Hospital Stay (HOSPITAL_COMMUNITY): Payer: Medicare Other

## 2019-04-09 ENCOUNTER — Encounter (HOSPITAL_COMMUNITY)
Admission: EM | Disposition: A | Discharge status: Skilled Nursing Facility | Payer: Self-pay | Source: Home / Self Care | Attending: Internal Medicine

## 2019-04-09 DIAGNOSIS — T796XXD Traumatic ischemia of muscle, subsequent encounter: Secondary | ICD-10-CM

## 2019-04-09 DIAGNOSIS — J9601 Acute respiratory failure with hypoxia: Secondary | ICD-10-CM

## 2019-04-09 HISTORY — PX: FEMUR IM NAIL: SHX1597

## 2019-04-09 LAB — BASIC METABOLIC PANEL
Anion gap: 5 (ref 5–15)
BUN: 9 mg/dL (ref 8–23)
CO2: 26 mmol/L (ref 22–32)
Calcium: 8.6 mg/dL — ABNORMAL LOW (ref 8.9–10.3)
Chloride: 103 mmol/L (ref 98–111)
Creatinine, Ser: 0.8 mg/dL (ref 0.44–1.00)
GFR calc Af Amer: >60 mL/min (ref >60)
GFR calc non Af Amer: >60 mL/min (ref >60)
Glucose, Bld: 146 mg/dL — ABNORMAL HIGH (ref 70–99)
Potassium: 3.9 mmol/L (ref 3.5–5.1)
Sodium: 134 mmol/L — ABNORMAL LOW (ref 135–145)

## 2019-04-09 LAB — CBC
HCT: 28.2 % — ABNORMAL LOW (ref 36.0–46.0)
Hemoglobin: 8.5 g/dL — ABNORMAL LOW (ref 12.0–15.0)
MCH: 23.3 pg — ABNORMAL LOW (ref 26.0–34.0)
MCHC: 30.1 g/dL (ref 30.0–36.0)
MCV: 77.3 fL — ABNORMAL LOW (ref 80.0–100.0)
Platelets: 272 10*3/uL (ref 150–400)
RBC: 3.65 MIL/uL — ABNORMAL LOW (ref 3.87–5.11)
RDW: 18.4 % — ABNORMAL HIGH (ref 11.5–15.5)
WBC: 7.8 10*3/uL (ref 4.0–10.5)
nRBC: 0 % (ref 0.0–0.2)

## 2019-04-09 LAB — GLUCOSE, CAPILLARY
Glucose-Capillary: 126 mg/dL — ABNORMAL HIGH (ref 70–99)
Glucose-Capillary: 143 mg/dL — ABNORMAL HIGH (ref 70–99)
Glucose-Capillary: 182 mg/dL — ABNORMAL HIGH (ref 70–99)
Glucose-Capillary: 153 mg/dL — ABNORMAL HIGH (ref 70–99)

## 2019-04-09 LAB — MRSA PCR SCREENING: MRSA by PCR: NEGATIVE

## 2019-04-09 SURGERY — INSERTION, INTRAMEDULLARY ROD, FEMUR
Anesthesia: General | Laterality: Right

## 2019-04-09 MED ORDER — ONDANSETRON HCL 4 MG/2ML IJ SOLN
INTRAMUSCULAR | Status: AC
  Filled 2019-04-09: qty 2

## 2019-04-09 MED ORDER — HYDROMORPHONE HCL 1 MG/ML IJ SOLN: 0.5000 mg | INTRAMUSCULAR | Status: DC | PRN

## 2019-04-09 MED ORDER — ONDANSETRON HCL 4 MG PO TABS: 4.0000 mg | ORAL_TABLET | Freq: Four times a day (QID) | ORAL | Status: DC | PRN

## 2019-04-09 MED ORDER — SUCCINYLCHOLINE CHLORIDE 20 MG/ML IJ SOLN
INTRAMUSCULAR | Status: DC | PRN
  Administered 2019-04-09: 80 mg via INTRAVENOUS

## 2019-04-09 MED ORDER — CEFAZOLIN SODIUM-DEXTROSE 2-4 GM/100ML-% IV SOLN
2.0000 g | Freq: Four times a day (QID) | INTRAVENOUS | Status: AC
  Administered 2019-04-09 (×2): 2 g via INTRAVENOUS
  Filled 2019-04-09 (×2): qty 100

## 2019-04-09 MED ORDER — FENTANYL CITRATE (PF) 250 MCG/5ML IJ SOLN
INTRAMUSCULAR | Status: AC
  Filled 2019-04-09: qty 5

## 2019-04-09 MED ORDER — PROPOFOL 10 MG/ML IV BOLUS
INTRAVENOUS | Status: AC
  Filled 2019-04-09: qty 20

## 2019-04-09 MED ORDER — BISACODYL 10 MG RE SUPP
10.0000 mg | Freq: Every day | RECTAL | Status: DC | PRN
  Administered 2019-04-11: 10 mg via RECTAL
  Filled 2019-04-09: qty 1

## 2019-04-09 MED ORDER — PROPOFOL 10 MG/ML IV BOLUS
INTRAVENOUS | Status: DC | PRN
  Administered 2019-04-09: 80 mg via INTRAVENOUS

## 2019-04-09 MED ORDER — FENTANYL CITRATE (PF) 250 MCG/5ML IJ SOLN
INTRAMUSCULAR | Status: DC | PRN
  Administered 2019-04-09 (×3): 50 ug via INTRAVENOUS

## 2019-04-09 MED ORDER — DEXAMETHASONE SODIUM PHOSPHATE 10 MG/ML IJ SOLN
INTRAMUSCULAR | Status: AC
  Filled 2019-04-09: qty 1

## 2019-04-09 MED ORDER — ROCURONIUM BROMIDE 10 MG/ML (PF) SYRINGE
PREFILLED_SYRINGE | INTRAVENOUS | Status: AC
  Filled 2019-04-09: qty 10

## 2019-04-09 MED ORDER — FENTANYL CITRATE (PF) 100 MCG/2ML IJ SOLN: 25.0000 ug | INTRAMUSCULAR | Status: DC | PRN

## 2019-04-09 MED ORDER — DOCUSATE SODIUM 100 MG PO CAPS
100.0000 mg | ORAL_CAPSULE | Freq: Two times a day (BID) | ORAL | Status: DC
  Administered 2019-04-09 – 2019-04-13 (×7): 100 mg via ORAL
  Filled 2019-04-09 (×7): qty 1

## 2019-04-09 MED ORDER — ACETAMINOPHEN 500 MG PO TABS
500.0000 mg | ORAL_TABLET | Freq: Four times a day (QID) | ORAL | Status: AC
  Administered 2019-04-09 – 2019-04-10 (×4): 500 mg via ORAL
  Filled 2019-04-09 (×4): qty 1

## 2019-04-09 MED ORDER — EPHEDRINE 5 MG/ML INJ
INTRAVENOUS | Status: AC
  Filled 2019-04-09: qty 10

## 2019-04-09 MED ORDER — POLYETHYLENE GLYCOL 3350 17 G PO PACK
17.0000 g | PACK | Freq: Every day | ORAL | Status: DC | PRN
  Administered 2019-04-10: 17 g via ORAL

## 2019-04-09 MED ORDER — ONDANSETRON HCL 4 MG/2ML IJ SOLN
INTRAMUSCULAR | Status: DC | PRN
  Administered 2019-04-09: 4 mg via INTRAVENOUS

## 2019-04-09 MED ORDER — LIDOCAINE 2% (20 MG/ML) 5 ML SYRINGE
INTRAMUSCULAR | Status: AC
  Filled 2019-04-09: qty 5

## 2019-04-09 MED ORDER — BACLOFEN 5 MG HALF TABLET
10.0000 mg | ORAL_TABLET | Freq: Three times a day (TID) | ORAL | Status: DC | PRN
  Administered 2019-04-13: 10 mg via ORAL
  Filled 2019-04-09: qty 2

## 2019-04-09 MED ORDER — FLEET ENEMA 7-19 GM/118ML RE ENEM: 1.0000 | ENEMA | Freq: Once | RECTAL | Status: DC | PRN

## 2019-04-09 MED ORDER — ENOXAPARIN SODIUM 40 MG/0.4ML ~~LOC~~ SOLN: 40.0000 mg | SUBCUTANEOUS | 0 refills | Status: DC

## 2019-04-09 MED ORDER — 0.9 % SODIUM CHLORIDE (POUR BTL) OPTIME
TOPICAL | Status: DC | PRN
  Administered 2019-04-09: 1000 mL

## 2019-04-09 MED ORDER — HYDROCODONE-ACETAMINOPHEN 5-325 MG PO TABS: 1.0000 | ORAL_TABLET | Freq: Four times a day (QID) | ORAL | 0 refills | Status: DC | PRN

## 2019-04-09 MED ORDER — PHENYLEPHRINE 40 MCG/ML (10ML) SYRINGE FOR IV PUSH (FOR BLOOD PRESSURE SUPPORT)
PREFILLED_SYRINGE | INTRAVENOUS | Status: AC
  Filled 2019-04-09: qty 10

## 2019-04-09 MED ORDER — SUGAMMADEX SODIUM 200 MG/2ML IV SOLN
INTRAVENOUS | Status: DC | PRN
  Administered 2019-04-09: 200 mg via INTRAVENOUS

## 2019-04-09 MED ORDER — ONDANSETRON HCL 4 MG/2ML IJ SOLN: 4.0000 mg | Freq: Four times a day (QID) | INTRAMUSCULAR | Status: DC | PRN

## 2019-04-09 MED ORDER — HYDROCODONE-ACETAMINOPHEN 5-325 MG PO TABS
1.0000 | ORAL_TABLET | ORAL | Status: DC | PRN
  Administered 2019-04-10 – 2019-04-12 (×7): 1 via ORAL
  Administered 2019-04-13: 2 via ORAL
  Filled 2019-04-09 (×2): qty 1
  Filled 2019-04-09: qty 2
  Filled 2019-04-09 (×5): qty 1

## 2019-04-09 MED ORDER — METOCLOPRAMIDE HCL 5 MG PO TABS: 5.0000 mg | ORAL_TABLET | Freq: Three times a day (TID) | ORAL | Status: DC | PRN

## 2019-04-09 MED ORDER — SUCCINYLCHOLINE CHLORIDE 200 MG/10ML IV SOSY
PREFILLED_SYRINGE | INTRAVENOUS | Status: AC
  Filled 2019-04-09: qty 10

## 2019-04-09 MED ORDER — ROCURONIUM BROMIDE 10 MG/ML (PF) SYRINGE
PREFILLED_SYRINGE | INTRAVENOUS | Status: DC | PRN
  Administered 2019-04-09: 50 mg via INTRAVENOUS

## 2019-04-09 MED ORDER — METOCLOPRAMIDE HCL 5 MG/ML IJ SOLN: 5.0000 mg | Freq: Three times a day (TID) | INTRAMUSCULAR | Status: DC | PRN

## 2019-04-09 MED ORDER — LACTATED RINGERS IV SOLN
INTRAVENOUS | Status: DC | PRN
  Administered 2019-04-09: 07:00:00 via INTRAVENOUS

## 2019-04-09 MED ORDER — TRANEXAMIC ACID-NACL 1000-0.7 MG/100ML-% IV SOLN
INTRAVENOUS | Status: AC
  Filled 2019-04-09: qty 100

## 2019-04-09 MED ORDER — DEXAMETHASONE SODIUM PHOSPHATE 10 MG/ML IJ SOLN
INTRAMUSCULAR | Status: DC | PRN
  Administered 2019-04-09: 5 mg via INTRAVENOUS

## 2019-04-09 MED ORDER — ACETAMINOPHEN 325 MG PO TABS: 325.0000 mg | ORAL_TABLET | Freq: Four times a day (QID) | ORAL | Status: DC | PRN

## 2019-04-09 MED ORDER — LIDOCAINE 2% (20 MG/ML) 5 ML SYRINGE
INTRAMUSCULAR | Status: DC | PRN
  Administered 2019-04-09: 100 mg via INTRAVENOUS

## 2019-04-09 SURGICAL SUPPLY — 38 items
BIT DRILL AO GAMMA 4.2X180 (BIT) ×2 IMPLANT
CLOSURE STERI-STRIP 1/2X4 (GAUZE/BANDAGES/DRESSINGS) ×1
CLSR STERI-STRIP ANTIMIC 1/2X4 (GAUZE/BANDAGES/DRESSINGS) ×2 IMPLANT
COVER MAYO STAND STRL (DRAPES) ×3 IMPLANT
COVER PERINEAL POST (MISCELLANEOUS) ×3 IMPLANT
COVER SURGICAL LIGHT HANDLE (MISCELLANEOUS) ×3 IMPLANT
COVER WAND RF STERILE (DRAPES) ×3 IMPLANT
DRAPE STERI IOBAN 125X83 (DRAPES) ×3 IMPLANT
DRSG MEPILEX BORDER 4X4 (GAUZE/BANDAGES/DRESSINGS) ×9 IMPLANT
DURAPREP 26ML APPLICATOR (WOUND CARE) ×3 IMPLANT
ELECT REM PT RETURN 9FT ADLT (ELECTROSURGICAL) ×3
ELECTRODE REM PT RTRN 9FT ADLT (ELECTROSURGICAL) ×1 IMPLANT
GLOVE BIO SURGEON STRL SZ7.5 (GLOVE) ×6 IMPLANT
GLOVE BIOGEL PI IND STRL 8 (GLOVE) ×2 IMPLANT
GLOVE BIOGEL PI INDICATOR 8 (GLOVE) ×4
GOWN STRL REUS W/ TWL LRG LVL3 (GOWN DISPOSABLE) ×2 IMPLANT
GOWN STRL REUS W/TWL LRG LVL3 (GOWN DISPOSABLE) ×9
GUIDEROD T2 3X1000 (ROD) ×2 IMPLANT
K-WIRE  3.2X450M STR (WIRE) ×4
K-WIRE 3.2X450M STR (WIRE) ×2
KIT BASIN OR (CUSTOM PROCEDURE TRAY) ×3 IMPLANT
KIT NAIL LONG 10X340X125 (Nail) ×2 IMPLANT
KIT TURNOVER KIT B (KITS) ×3 IMPLANT
KWIRE 3.2X450M STR (WIRE) IMPLANT
NS IRRIG 1000ML POUR BTL (IV SOLUTION) ×3 IMPLANT
PACK GENERAL/GYN (CUSTOM PROCEDURE TRAY) ×3 IMPLANT
PAD ARMBOARD 7.5X6 YLW CONV (MISCELLANEOUS) ×6 IMPLANT
SCREW LAG GAMMA 3 110MM (Screw) ×2 IMPLANT
SCREW LOCKING T2 F/T  5MMX50MM (Screw) ×2 IMPLANT
SCREW LOCKING T2 F/T 5MMX50MM (Screw) IMPLANT
SUT MNCRL AB 4-0 PS2 18 (SUTURE) ×3 IMPLANT
SUT VIC AB 0 CT1 27 (SUTURE) ×3
SUT VIC AB 0 CT1 27XBRD ANBCTR (SUTURE) IMPLANT
SUT VIC AB 2-0 CT1 27 (SUTURE) ×6
SUT VIC AB 2-0 CT1 TAPERPNT 27 (SUTURE) ×1 IMPLANT
TOWEL GREEN STERILE (TOWEL DISPOSABLE) ×3 IMPLANT
TOWEL OR NON WOVEN STRL DISP B (DISPOSABLE) ×3 IMPLANT
WATER STERILE IRR 1000ML POUR (IV SOLUTION) ×3 IMPLANT

## 2019-04-09 NOTE — Progress Notes (Addendum)
PROGRESS NOTE  SHAUNE MALACARA QTM:[id number] DOB: [birth year] DOA: 04/08/2019 PCP: Maury Dus, MD  Brief History   Melinda Kerr is a 84 y.o. female with medical history significant of CHF, HLD, HTN, hypothyroidism, DM type II, endometrial cancer s/p total robotic hysterectomy, obesity, GI bleed, and OSA on CPAP; who presents after having a fall at approximately 2:30 AM this morning complaining of right knee pain.  Patient reports getting up and feeling as though her right knee gave out prior to her falling onto the floor.  She lives alone and normally ambulates with use of a cane.  She does not usually utilize a cane while in the house.  She was unable to get up off the floor after falling and unable to call for help.  Denies any loss of consciousness, trauma to her head, fever, chills, shortness of breath, palpitations, or chest pain.  Her niece came over to check on her late this morning and found her on the floor and called EMS.  Patient had been admitted into the hospital back in February of this year for GI bleed found to have large hiatal hernia with cameron ulcers.  The patient was found to have a comminuted and angulated fracture of the proximal left femur. [facility] was called to admit and orthopedic surgery was consulted.  The patient was admitted to a medical bed. She went for ORIF this morning with Dr. Percell Miller. She has tolerated the procedure well.  Consultants  . Orthopedic surgery  Procedures  . ORIF left hip  Antibiotics   Anti-infectives (From admission, onward)   Start     Dose/Rate Route Frequency Ordered Stop   04/09/19 1345  ceFAZolin (ANCEF) IVPB 2g/100 mL premix     2 g 200 mL/hr over 30 Minutes Intravenous Every 6 hours 04/09/19 1037 04/10/19 0144   04/09/19 0600  ceFAZolin (ANCEF) IVPB 2g/100 mL premix     2 g 200 mL/hr over 30 Minutes Intravenous On call to O.R. 04/08/19 2211 04/09/19 6256    .   Subjective  The patient is resting comfortably. No new complaints.   Objective   Vitals:  Vitals:   04/09/19 1031 04/09/19 1158  BP: 133/67 (!) 144/75  Pulse: 85 80  Resp:    Temp: (!) 97.5 F (36.4 C) 97.8 F (36.6 C)  SpO2: 91% 95%    Exam:  Constitutional:  . The patient is awake, alert, and oriented x 3. No acute distress. Respiratory:  . No increased work of breathing. She is saturating in the low to mid nineties on 4L O2 by nasal cannula. . No wheezes, rales, or rhonchi.  . No tactile fremitus. Cardiovascular:  . Regular rate and rhythm . No murmurs, ectopy, or gallups. . No lateral PMI. No thrills. Abdomen:  . Abdomen is soft, non-tender, non-distended . No hernias, masses, or organomegaly are appreciated. Marland Kitchen Hypoactive bowel sounds.  Musculoskeletal:  . Good capillary refill . No cyanosis, clubbing, or edema. Skin:  . No rashes, lesions, ulcers . palpation of skin: no induration or nodules Neurologic:  . CN 2-12 intact . Sensation all 4 extremities intact Psychiatric:  . Affect and mood are appropriate.  I have personally reviewed the following:   Today's Data  . BMP, CBC, Vitals  Scheduled Meds: . acetaminophen  500 mg Oral Q6H  . allopurinol  300 mg Oral Daily  . citalopram  20 mg Oral Daily  . docusate sodium  100 mg Oral BID  . enoxaparin (LOVENOX) injection  40 mg Subcutaneous Q24H  . fesoterodine  8 mg Oral QHS  . furosemide  20 mg Oral Daily  . hydrOXYzine  25 mg Oral QHS  . insulin aspart  0-9 Units Subcutaneous TID WC  . levothyroxine  125 mcg Oral Q0600  . Melatonin  3 mg Oral QHS  . pantoprazole  40 mg Oral BID AC  . polyethylene glycol  17 g Oral Daily  . [START ON 04/13/2019] pravastatin  10 mg Oral Q7 days  . zolpidem  5 mg Oral QHS   Continuous Infusions: .  ceFAZolin (ANCEF) IV 2 g (04/09/19 1313)    Active Problems:   OSA on CPAP   Hypothyroidism   Anemia   Depression   Malignant neoplasm of uterus ([place])   Hiatal hernia   Displaced comminuted fracture of shaft of right femur, initial  encounter for closed fracture ([place])   LOS: 1 day    A & P   Communicated right proximal femur fracture due to fall: Acute.  Patient reports that her right knee gave out leading to her subsequently falling.  Patient was on the floor for approximately 9 hours prior to being found by her niece.  X-rays reveal communicated right proximal fracture.  Orthopedic surgery has been consulted and the patient has undergone ORIF of the right hip this morning and has tolerated the procedure well. Pain control is with morphine and hydrocodone.   Mild Rhabdomyolysis: Secondary to trauma of fall and being on the floor for 9 hours. Will give IV fluids cautiously and monitor CK as well as electrolytes, creatinine, and volume status.  Acyte hypoxic respiratory insufficiency: The patient is now requiring 3 L O2 by nasal cannula. She does not require O2 at home and did not require it prior to surgery. Likely due to atelectasis and anesthesia, but will check CXR to rule out volume overload. Incentive spirometry.  Leukocytosis: REactive and due to fall and fracture. COVID-19 testing negative.   Microcytic anemia: Chronic.  Hemoglobin 9.9 upon admission and 8.5 this following am. Previous baseline appears to be 7- 8 g/dL based upon labs obtained during hospitalization in February. Monitor.  Diabetes mellitus type 2: Blood glucose elevated at 198 on admission.  Suspect secondary to acute distress.  Last hemoglobin A1c noted to be 6.2 on 10/26/2018. The patient's glucoses will be followed with FSBS and SSI. She is on a carbohydrate controlled diet and a hypoglycemic protocol.  Diastolic CHF: Last echocardiogram obtained on 10/28/2018 noting EF of 60 to 65% with grade 2 diastolic dysfunction.  Patient appears to be euvolemic at this time.  Patient on furosemide 20 mg daily. She will receive cautious IV fluids due to mild rhabdomyolysis. Monitor creatinine, electrolytes, and volume status.  Hypothyroidism: Continue  levothyroxine as at home.  Depression: Continue Celexa as at home.  Hyperlipidemia: Continue pravastatin as at home.  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 Patient is status post adjuvant chemotherapy with 6 cycles of carboplatin and Taxol tassel and vaginal brachytherapy completed in February 2018.  Continue outpatient follow-up with Dr. Denman George  Hiatal hernia, GERD, history of GI bleed: Patient was previously admitted into the hospital in February of this year for GI bleed.  EGD revealed large hiatal hernia with Lysbeth Galas ulcers as likely cause of symptoms. She will be continued on Protonix as at home.  I have seen and examined this patient myself. I have spent 35 minutes  in her evaluation and care.  DVT prophylaxis: Lovenox Code Status: Full Family Communication: None available. Disposition Plan: Likely we will need rehab at discharge  Morrison Mcbryar, DO [facility] Direct contact: see [email or web address]  7PM-7AM contact night coverage as above  04/09/2019, 1:27 PM  LOS: 1 day   ADDENDUM: Chest x-ray demonstrates bilateral atelectasis which is the putative cause for her increased oxygen requirements.

## 2019-04-09 NOTE — Transfer of Care (Signed)
Immediate Anesthesia Transfer of Care Note  Patient: Melinda Kerr  Procedure(s) Performed: INTRAMEDULLARY (IM) NAIL INTERTROCH (Right )  Patient Location: PACU  Anesthesia Type:General  Level of Consciousness: drowsy and patient cooperative  Airway & Oxygen Therapy: Patient Spontanous Breathing and Patient connected to nasal cannula oxygen  Post-op Assessment: Report given to RN, Post -op Vital signs reviewed and stable and Patient moving all extremities X 4  Post vital signs: Reviewed and stable  Last Vitals:  Vitals Value Taken Time  BP 142/69 04/09/19 0927  Temp    Pulse 77 04/09/19 0928  Resp 22 04/09/19 0928  SpO2 93 % 04/09/19 0928  Vitals shown include unvalidated device data.  Last Pain:  Vitals:   04/09/19 0323  TempSrc: Oral  PainSc:          Complications: No apparent anesthesia complications

## 2019-04-09 NOTE — Progress Notes (Signed)
Patient refused CPAP for tonight 

## 2019-04-09 NOTE — Op Note (Signed)
DATE OF SURGERY:  04/09/2019  TIME: 9:15 AM  PATIENT NAME:  Melinda Kerr  AGE: 84 y.o.  PRE-OPERATIVE DIAGNOSIS:  Right hip fracture  POST-OPERATIVE DIAGNOSIS:  SAME  PROCEDURE:  INTRAMEDULLARY (IM) NAIL INTERTROCH  SURGEON:  Renette Butters  ASSISTANT:  Roxan Hockey, PA-C, he was present and scrubbed throughout the case, critical for completion in a timely fashion, and for retraction, instrumentation, and closure.   OPERATIVE IMPLANTS: [employer]ker Gamma Nail  PREOPERATIVE INDICATIONS:  [name] is a 84 y.o. year old who fell and suffered a hip fracture. She was brought into the ER and then admitted and optimized and then elected for surgical intervention.    The risks benefits and alternatives were discussed with the patient including but not limited to the risks of nonoperative treatment, versus surgical intervention including infection, bleeding, nerve injury, malunion, nonunion, hardware prominence, hardware failure, need for hardware removal, blood clots, cardiopulmonary complications, morbidity, mortality, among others, and they were willing to proceed.    OPERATIVE PROCEDURE:  The patient was brought to the operating room and placed in the supine position. General anesthesia was administered. She was placed on the fracture table.  Closed reduction was performed under C-arm guidance. Time out was then performed after sterile prep and drape. She received preoperative antibiotics.  Incision was made proximal to the greater trochanter. A guidewire was placed in the appropriate position. Confirmation was made on AP and lateral views. The above-named nail was opened. I opened the proximal femur with a reamer. I then placed the nail by hand easily down. I did not need to ream the femur.  Once the nail was completely seated, I placed a guidepin into the femoral head into the center center position. I measured the length, and then reamed the lateral cortex and up into the head.  I then placed the lag screw. Slight compression was applied. Anatomic fixation achieved. Bone quality was mediocre.  I then secured the proximal interlocking bolt, and took off a half a turn, and then removed the instruments, and took final C-arm pictures AP and lateral the entire length of the leg.  I then used perfect circles technique to place a distal interlock screw.   Anatomic reconstruction was achieved, and the wounds were irrigated copiously and closed with Vicryl followed by staples and sterile gauze for the skin. The patient was awakened and returned to PACU in stable and satisfactory condition. There no complications and the patient tolerated the procedure well.  She will be weightbearing as tolerated, and will be on chemical px  for a period of four weeks after discharge.   Edmonia Lynch, M.D.

## 2019-04-09 NOTE — Anesthesia Postprocedure Evaluation (Signed)
Anesthesia Post Note  Patient: Jenita Berton Mount  Procedure(s) Performed: INTRAMEDULLARY (IM) NAIL INTERTROCH (Right )     Patient location during evaluation: PACU Anesthesia Type: General Level of consciousness: awake and alert Pain management: pain level controlled Vital Signs Assessment: post-procedure vital signs reviewed and stable Respiratory status: spontaneous breathing, nonlabored ventilation, respiratory function stable and patient connected to nasal cannula oxygen Cardiovascular status: blood pressure returned to baseline and stable Postop Assessment: no apparent nausea or vomiting Anesthetic complications: no    Last Vitals:  Vitals:   04/09/19 1000 04/09/19 1031  BP: (!) 143/70 133/67  Pulse: 83 85  Resp: 17   Temp: (!) 36.3 C (!) 36.4 C  SpO2: 93% 91%    Last Pain:  Vitals:   04/09/19 1045  TempSrc:   PainSc: 0-No pain                 Effie Berkshire

## 2019-04-09 NOTE — Anesthesia Preprocedure Evaluation (Addendum)
Anesthesia Evaluation  Patient identified by MRN, date of birth, ID band Patient awake    Reviewed: Allergy & Precautions, NPO status , Patient's Chart, lab work & pertinent test results  Airway Mallampati: II  TM Distance: <3 FB Neck ROM: Full    Dental  (+) Teeth Intact, Dental Advisory Given   Pulmonary sleep apnea and Continuous Positive Airway Pressure Ventilation ,    breath sounds clear to auscultation       Cardiovascular hypertension, Pt. on medications + CAD and +CHF   Rhythm:Regular Rate:Normal     Neuro/Psych Depression negative neurological ROS     GI/Hepatic hiatal hernia, GERD  Medicated,  Endo/Other  diabetes, Type 2Hypothyroidism   Renal/GU      Musculoskeletal  (+) Arthritis ,   Abdominal (+) + obese,   Peds  Hematology   Anesthesia Other Findings - HLD  Reproductive/Obstetrics                           Lab Results  Component Value Date   WBC 7.8 04/09/2019   HGB 8.5 (L) 04/09/2019   HCT 28.2 (L) 04/09/2019   MCV 77.3 (L) 04/09/2019   PLT 272 04/09/2019   Lab Results  Component Value Date   CREATININE 0.80 04/09/2019   BUN 9 04/09/2019   NA 134 (L) 04/09/2019   K 3.9 04/09/2019   CL 103 04/09/2019   CO2 26 04/09/2019   Lab Results  Component Value Date   INR 1.1 04/08/2019   INR 1.23 11/13/2018   INR 1.00 04/19/2018   EKG: normal sinus rhythm, 1st degree AV block.  Echo: - Left ventricle: The cavity size was normal. Systolic function was   normal. The estimated ejection fraction was in the range of 60%   to 65%. Wall motion was normal; there were no regional wall   motion abnormalities. Features are consistent with a pseudonormal   left ventricular filling pattern, with concomitant abnormal   relaxation and increased filling pressure (grade 2 diastolic   dysfunction). - Mitral valve: Mildly calcified annulus. Mild, late   systolicprolapse, involving  the anterior leaflet. - Left atrium: The atrium was moderately dilated. - Right ventricle: The cavity size was mildly dilated. Wall   thickness was normal. - Right atrium: The atrium was mildly dilated. - Pulmonary arteries: Systolic pressure was mildly increased. PA   peak pressure: 37 mm Hg (S). - Pericardium, extracardiac: A trivial pericardial effusion was   identified.  Impressions:  - Increased velocities are seen across all valves, consistent with   high cardiac output (consider anemia, sepsis, thyrotoxicosis,   etc.)   Anesthesia Physical Anesthesia Plan  ASA: III  Anesthesia Plan: General   Post-op Pain Management:    Induction: Intravenous  PONV Risk Score and Plan: 4 or greater and Ondansetron and Treatment may vary due to age or medical condition  Airway Management Planned: Oral ETT  Additional Equipment: None  Intra-op Plan:   Post-operative Plan: Extubation in OR  Informed Consent: I have reviewed the patients History and Physical, chart, labs and discussed the procedure including the risks, benefits and alternatives for the proposed anesthesia with the patient or authorized representative who has indicated his/her understanding and acceptance.     Dental advisory given  Plan Discussed with: CRNA  Anesthesia Plan Comments:        Anesthesia Quick Evaluation

## 2019-04-09 NOTE — Interval H&P Note (Signed)
I participated in the care of this patient and agree with the above history, physical and evaluation. I performed a review of the history and a physical exam as detailed   Emmely Bittinger Daniel Anyelina Claycomb MD  

## 2019-04-09 NOTE — Anesthesia Procedure Notes (Addendum)
Procedure Name: Intubation Date/Time: 04/09/2019 8:13 AM Performed by: Julieta Bellini, CRNA Pre-anesthesia Checklist: Patient identified, Emergency Drugs available, Suction available and Patient being monitored Patient Re-evaluated:Patient Re-evaluated prior to induction Oxygen Delivery Method: Circle system utilized Preoxygenation: Pre-oxygenation with 100% oxygen Induction Type: IV induction Ventilation: Mask ventilation without difficulty Laryngoscope Size: Mac and 3 Grade View: Grade II Tube type: Oral Number of attempts: 2 Airway Equipment and Method: Stylet and Oral airway Placement Confirmation: ETT inserted through vocal cords under direct vision,  positive ETCO2 and breath sounds checked- equal and bilateral Secured at: 22 cm Tube secured with: Tape Dental Injury: Teeth and Oropharynx as per pre-operative assessment  Comments: DLx1 by CRNA, grade 3 view with Mac 3 no attempt. Dr .Smith Robert DL x1 with Grade 2 view, Mac 3 successful intubation

## 2019-04-10 LAB — GLUCOSE, CAPILLARY
Glucose-Capillary: 121 mg/dL — ABNORMAL HIGH (ref 70–99)
Glucose-Capillary: 140 mg/dL — ABNORMAL HIGH (ref 70–99)
Glucose-Capillary: 127 mg/dL — ABNORMAL HIGH (ref 70–99)
Glucose-Capillary: 139 mg/dL — ABNORMAL HIGH (ref 70–99)

## 2019-04-10 LAB — BASIC METABOLIC PANEL
Anion gap: 7 (ref 5–15)
BUN: 13 mg/dL (ref 8–23)
CO2: 28 mmol/L (ref 22–32)
Calcium: 9 mg/dL (ref 8.9–10.3)
Chloride: 103 mmol/L (ref 98–111)
Creatinine, Ser: 1 mg/dL (ref 0.44–1.00)
GFR calc Af Amer: >60 mL/min (ref >60)
GFR calc non Af Amer: 52 mL/min — ABNORMAL LOW (ref >60)
Glucose, Bld: 134 mg/dL — ABNORMAL HIGH (ref 70–99)
Potassium: 5 mmol/L (ref 3.5–5.1)
Sodium: 138 mmol/L (ref 135–145)

## 2019-04-10 NOTE — Progress Notes (Signed)
PROGRESS NOTE  NEVILLE PAULS JSH:[id number] DOB: [birth year] DOA: 04/08/2019 PCP: Maury Dus, MD  Brief History   Melinda Kerr is a 84 y.o. female with medical history significant of CHF, HLD, HTN, hypothyroidism, DM type II, endometrial cancer s/p total robotic hysterectomy, obesity, GI bleed, and OSA on CPAP; who presents after having a fall at approximately 2:30 AM this morning complaining of right knee pain.  Patient reports getting up and feeling as though her right knee gave out prior to her falling onto the floor.  She lives alone and normally ambulates with use of a cane.  She does not usually utilize a cane while in the house.  She was unable to get up off the floor after falling and unable to call for help.  Denies any loss of consciousness, trauma to her head, fever, chills, shortness of breath, palpitations, or chest pain.  Her niece came over to check on her late this morning and found her on the floor and called EMS.  Patient had been admitted into the hospital back in February of this year for GI bleed found to have large hiatal hernia with cameron ulcers.  The patient was found to have a comminuted and angulated fracture of the proximal left femur. TRH was called to admit and orthopedic surgery was consulted.  The patient was admitted to a medical bed. She went for ORIF this morning with Dr. Percell Miller. She has tolerated the procedure well. She has been evaluated by PT/OT. They recommend SNF prior to discharge to home.  Consultants  . Orthopedic surgery  Procedures  . ORIF left hip  Antibiotics   Anti-infectives (From admission, onward)   Start     Dose/Rate Route Frequency Ordered Stop   04/09/19 1345  ceFAZolin (ANCEF) IVPB 2g/100 mL premix     2 g 200 mL/hr over 30 Minutes Intravenous Every 6 hours 04/09/19 1037 04/09/19 2125   04/09/19 0600  ceFAZolin (ANCEF) IVPB 2g/100 mL premix     2 g 200 mL/hr over 30 Minutes Intravenous On call to O.R. 04/08/19 2211 04/09/19 8502       Subjective  The patient is resting comfortably. No new complaints.  Objective   Vitals:  Vitals:   04/10/19 1310 04/10/19 1352  BP:  (!) 105/54  Pulse: 92 85  Resp:  18  Temp:  98.4 F (36.9 C)  SpO2: 92% 93%    Exam:  Constitutional:  . The patient is awake, alert, and oriented x 3. No acute distress. Respiratory:  . No increased work of breathing. She is saturating in the low to mid nineties on 1L  O2 by nasal cannula this afternoon. . No wheezes, rales, or rhonchi.  . No tactile fremitus. Cardiovascular:  . Regular rate and rhythm . No murmurs, ectopy, or gallups. . No lateral PMI. No thrills. Abdomen:  . Abdomen is soft, non-tender, non-distended . No hernias, masses, or organomegaly are appreciated. Marland Kitchen Hypoactive bowel sounds.  Musculoskeletal:  . Good capillary refill . No cyanosis, clubbing, or edema. Skin:  . No rashes, lesions, ulcers . palpation of skin: no induration or nodules Neurologic:  . CN 2-12 intact . Sensation all 4 extremities intact Psychiatric:  . Affect and mood are appropriate.  I have personally reviewed the following:   Today's Data  . BMP, CBC, Vitals  Scheduled Meds: . allopurinol  300 mg Oral Daily  . citalopram  20 mg Oral Daily  . docusate sodium  100 mg Oral BID  .  enoxaparin (LOVENOX) injection  40 mg Subcutaneous Q24H  . fesoterodine  8 mg Oral QHS  . furosemide  20 mg Oral Daily  . hydrOXYzine  25 mg Oral QHS  . insulin aspart  0-9 Units Subcutaneous TID WC  . levothyroxine  125 mcg Oral Q0600  . Melatonin  3 mg Oral QHS  . pantoprazole  40 mg Oral BID AC  . polyethylene glycol  17 g Oral Daily  . [START ON 04/13/2019] pravastatin  10 mg Oral Q7 days  . zolpidem  5 mg Oral QHS   Continuous Infusions:   Active Problems:   OSA on CPAP   Hypothyroidism   Anemia   Depression   Malignant neoplasm of uterus (HCC)   Hiatal hernia   Displaced comminuted fracture of shaft of right femur, initial encounter for  closed fracture ([place])   LOS: 2 days    A & P   Communicated right proximal femur fracture due to fall: Acute.  Patient reports that her right knee gave out leading to her subsequently falling.  Patient was on the floor for approximately 9 hours prior to being found by her niece.  X-rays reveal communicated right proximal fracture.  Orthopedic surgery has been consulted and the patient has undergone ORIF of the right hip this morning and has tolerated the procedure well. Pain control is with morphine and hydrocodone. PT/OT have recommended SNF placement prior to going home.  Mild Rhabdomyolysis: Secondary to trauma of fall and being on the floor for 9 hours. Will give IV fluids cautiously and monitor CK as well as electrolytes, creatinine, and volume status.  Acyte hypoxic respiratory insufficiency: The patient is now requiring 1 L O2 by nasal cannula in order to maintain saturations in the low to mid 90's. She does not require O2 at home and did not require it prior to surgery. Likely due to atelectasis and anesthesia. CXR demonstrated bilateral atelectasis. She is on incentive spirometry.  Leukocytosis: Reactive and due to fall and fracture. COVID-19 testing negative.   Microcytic anemia: Chronic.  Hemoglobin 9.9 upon admission and 8.5 this am. Previous baseline appears to be 7- 8 g/dL based upon labs obtained during hospitalization in February. Monitor.  Diabetes mellitus type 2: Blood glucose elevated at 198 on admission.  Suspect secondary to acute distress.  Last hemoglobin A1c noted to be 6.2 on 10/26/2018. The patient's glucoses will be followed with FSBS and SSI. She is on a carbohydrate controlled diet and a hypoglycemic protocol.  Diastolic CHF: Last echocardiogram obtained on 10/28/2018 noting EF of 60 to 65% with grade 2 diastolic dysfunction.  Patient appears to be euvolemic at this time.  Patient on furosemide 20 mg daily. She will receive cautious IV fluids due to mild  rhabdomyolysis. Monitor creatinine, electrolytes, and volume status.  Hypothyroidism: Continue levothyroxine as at home.  Depression: Continue Celexa as at home.  Hyperlipidemia: Continue pravastatin as at home.  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 Patient is status post adjuvant chemotherapy with 6 cycles of carboplatin and Taxol tassel and vaginal brachytherapy completed in February 2018.  Continue outpatient follow-up with Dr. Denman George  Hiatal hernia, GERD, history of GI bleed: Patient was previously admitted into the hospital in February of this year for GI bleed.  EGD revealed large hiatal hernia with Lysbeth Galas ulcers as likely cause of symptoms. She will be continued on Protonix as at home.  I have seen  and examined this patient myself. I have spent 30 minutes in her evaluation and care.  DVT prophylaxis: Lovenox Code Status: Full Family Communication: None available. Disposition Plan: Likely we will need rehab at discharge  Bhargav Barbaro, DO [facility] Direct contact: see [email or web address]  7PM-7AM contact night coverage as above  04/10/2019, 1:27 PM  LOS: 1 day .

## 2019-04-10 NOTE — Plan of Care (Signed)

## 2019-04-10 NOTE — Consult Note (Addendum)
   [facility] CM Inpatient Consult   04/10/2019  Melinda Kerr 1935-04-02 [id number]    Patientreviewed for potential need of [facility] care management services;  has 23% high risk score of unplanned readmissions, with 3 hospitalizations and 1 ED visit in the past 6 months, under her Medicare/ [employer] plan. She was outreached by [facility] CM coordinators for EMMI follow-up calls in the past.  Chart review and MD history and physicaldated 04/08/19, reveal as follows: Melinda Kerr a 84 y.o.femalewith medical history significant ofCHF,HLD,HTN,hypothyroidism, DM type II, endometrial cancer s/ptotal robotic hysterectomy, obesity, GI bleed,and OSA on CPAP;large hiatal hernia with cameron ulcers. who presents after having a fall, with right knee pain. Patient reports her right knee gave out prior to her falling onto the floor. She lives alone and normally ambulates with use of a cane. She was unable to get up off the floor after falling and unable to call for help. Her niece came over to check on her and found her on the floor and called EMS.   She was dmitted after having a fall resulting in a closed displaced right subtrochanteric femur fracture, status post IM nail- (Intramedullary nailing) by Dr. Percell Miller.    Patient'sprimary care provider -Dr. Maury Dus with [facility] to provide transition of care follow-up.  PT/ OTevaluation note reviewed and shows being recommended for skilled level therapies (SNF) since it is unsafe for her to return home alone and will need ST-SNF for safe transition home.  If there are changes incurrentdisposition, pleaserefer to [facility] care management forfollow-up of needs as appropriate.   Of note, [facility] Care Management services does not replace or interfere with any services that are arranged by transition of care case management or social work.   [facility] post acute RN coordinator will be made aware of patient's disposition to any facility covered by  [employer] care management for post discharge follow up of needs.    For questions and additional information, please call:  Melinda Kerr, BSN, RN-BC [facility] Liaison Cell: [phone]

## 2019-04-10 NOTE — Progress Notes (Signed)
Occupational Therapy Evaluation Patient Details Name: Melinda Kerr MRN: [id number] DOB: 1935/01/17 Today's Date: 04/10/2019    History of Present Illness Melinda Kerr is a 84 y.o. female with medical history significant of CHF, HLD, HTN, hypothyroidism, DM type II, endometrial cancer s/p total robotic hysterectomy, obesity, GI bleed, and OSA on CPAP; who presents after having a fall resulting in a closed displaced right subtrochanteric femur fracture, s/p IM nail.    Clinical Impression   PTA, pt was living at home alone, and reports she was independent with ADL/IADL and functional mobility. Pt currently requires maxA for LB ADL and modA+2 for functional mobility with use of Denna Haggard. Due to decline in current level of function, pt would benefit from acute OT to address established goals to facilitate safe D/C to venue listed below. At this time, recommend SNF follow-up. Will continue to follow acutely.     Follow Up Recommendations  SNF    Equipment Recommendations  3 in 1 bedside commode    Recommendations for Other Services       Precautions / Restrictions Precautions Precautions: Fall Restrictions Weight Bearing Restrictions: No Other Position/Activity Restrictions: WBAT      Mobility Bed Mobility Overal bed mobility: Needs Assistance Bed Mobility: Supine to Sit     Supine to sit: Max assist;+2 for physical assistance     General bed mobility comments: maxA to progress BLE off bed and maxA to progress trunk to upright position;assist to progress hips to EOB  Transfers Overall transfer level: Needs assistance Equipment used: Ambulation equipment used Transfers: Sit to/from Stand Sit to Stand: Mod assist;+2 physical assistance         General transfer comment: modA to progress to standing;assist for knee flexion to position feet prior to stadning    Balance Overall balance assessment: Needs assistance Sitting-balance support: Single extremity  supported;Feet supported Sitting balance-Leahy Scale: Poor Sitting balance - Comments: reliant on LUE to support while seated EOB pt able to tolerate short duration unsupported sitting, demonstrated preference for single UE support    Standing balance support: Bilateral upper extremity supported Standing balance-Leahy Scale: Poor Standing balance comment: reliant on BUE support and assist from OT/PT to maintain standing balance                           ADL either performed or assessed with clinical judgement   ADL Overall ADL's : Needs assistance/impaired Eating/Feeding: Set up;Sitting   Grooming: Set up;Sitting Grooming Details (indicate cue type and reason): requires assistance to open containers Upper Body Bathing: Minimal assistance;Sitting   Lower Body Bathing: Sit to/from stand;Maximal assistance   Upper Body Dressing : Minimal assistance;Sitting   Lower Body Dressing: Sit to/from stand;Maximal assistance   Toilet Transfer: Moderate assistance;+2 for physical assistance Toilet Transfer Details (indicate cue type and reason): simulated from EOB to recliner with use of sara stedy Toileting- Clothing Manipulation and Hygiene: Total assistance       Functional mobility during ADLs: Moderate assistance;+2 for physical assistance General ADL Comments: use of sara stedy;modA+2 to powerup into standing     Vision Baseline Vision/History: Wears glasses       Perception     Praxis      Pertinent Vitals/Pain Pain Assessment: Faces Faces Pain Scale: Hurts whole lot Pain Location: RLE Pain Descriptors / Indicators: Discomfort;Grimacing;Moaning Pain Intervention(s): Limited activity within patient's tolerance;Monitored during session;Repositioned     Hand Dominance Right   Extremity/Trunk Assessment Upper Extremity  Assessment Upper Extremity Assessment: RUE deficits/detail;Generalized weakness;LUE deficits/detail RUE Deficits / Details: decreased shoulder  ER/IR;grossly 3-/5previous shoulder fx resulting in limited shoulder flexion ~70degrees;difficulty reaching bar on sara stedy; RUE Sensation: WNL RUE Coordination: decreased gross motor LUE Deficits / Details: limited shoulder flexion ROM ~110 degrees;grossly 3/5   Lower Extremity Assessment Lower Extremity Assessment: Defer to PT evaluation;RLE deficits/detail RLE Deficits / Details: s/p IM nail   Cervical / Trunk Assessment Cervical / Trunk Assessment: Normal   Communication Communication Communication: No difficulties   Cognition Arousal/Alertness: Awake/alert Behavior During Therapy: WFL for tasks assessed/performed Overall Cognitive Status: No family/caregiver present to determine baseline cognitive functioning Area of Impairment: Attention;Memory;Safety/judgement;Problem solving                   Current Attention Level: Selective Memory: Decreased short-term memory   Safety/Judgement: Decreased awareness of safety   Problem Solving: Slow processing;Requires verbal cues;Requires tactile cues     General Comments  VSS throughout    Exercises     Shoulder Instructions      Home Living Family/patient expects to be discharged to:: Skilled nursing facility                                 Additional Comments: Plans for to DC to SNF      Prior Functioning/Environment Level of Independence: Needs assistance  Gait / Transfers Assistance Needed: uses cane for community mobility but not within the home. ADL's / Homemaking Assistance Needed: reports independence with ADL/IADL            OT Problem List: Decreased strength;Decreased range of motion;Decreased activity tolerance;Impaired balance (sitting and/or standing);Decreased cognition;Decreased safety awareness;Decreased knowledge of use of DME or AE;Decreased knowledge of precautions;Pain;Impaired UE functional use      OT Treatment/Interventions: Self-care/ADL training;Therapeutic  exercise;DME and/or AE instruction;Therapeutic activities;Patient/family education;Balance training;Cognitive remediation/compensation    OT Goals(Current goals can be found in the care plan section) Acute Rehab OT Goals Patient Stated Goal: to return home and take care of herself OT Goal Formulation: With patient Time For Goal Achievement: 04/24/19 Potential to Achieve Goals: Good ADL Goals Pt Will Perform Grooming: with modified independence Pt Will Perform Upper Body Dressing: with modified independence Pt Will Perform Lower Body Dressing: with modified independence;sit to/from stand Pt Will Transfer to Toilet: with modified independence Pt Will Perform Tub/Shower Transfer: with modified independence  OT Frequency: Min 2X/week   Barriers to D/C: Decreased caregiver support  pt lives alone       Co-evaluation PT/OT/SLP Co-Evaluation/Treatment: Yes Reason for Co-Treatment: For patient/therapist safety;To address functional/ADL transfers   OT goals addressed during session: ADL's and self-care      AM-PAC OT "6 Clicks" Daily Activity     Outcome Measure Help from another person eating meals?: None Help from another person taking care of personal grooming?: A Little Help from another person toileting, which includes using toliet, bedpan, or urinal?: A Lot Help from another person bathing (including washing, rinsing, drying)?: A Lot Help from another person to put on and taking off regular upper body clothing?: A Little Help from another person to put on and taking off regular lower body clothing?: A Lot 6 Click Score: 16   End of Session Equipment Utilized During Treatment: Gait belt(stedy) Nurse Communication: Mobility status;Need for lift equipment  Activity Tolerance: Patient tolerated treatment well;Patient limited by pain Patient left: in chair;with call bell/phone within reach;with chair alarm set  OT Visit Diagnosis: Unsteadiness on feet (R26.81);Other abnormalities  of gait and mobility (R26.89);Repeated falls (R29.6);Muscle weakness (generalized) (M62.81);History of falling (Z91.81);Pain Pain - Right/Left: Right Pain - part of body: Hip;Leg                Time: 3700-5259 OT Time Calculation (min): 32 min Charges:  OT General Charges $OT Visit: 1 Visit OT Evaluation $OT Eval Moderate Complexity: [place] OTR/L Acute Rehabilitation Services Office: [place] 04/10/2019, 10:25 AM

## 2019-04-10 NOTE — Evaluation (Signed)
Physical Therapy Evaluation Patient Details Name: Melinda Kerr MRN: [id number] DOB: 06-Jun-1935 Today's Date: 04/10/2019   History of Present Illness  Melinda Kerr is a 84 y.o. female with medical history significant of CHF, HLD, HTN, hypothyroidism, DM type II, endometrial cancer s/p total robotic hysterectomy, obesity, GI bleed, and OSA on CPAP; who presents after having a fall resulting in a closed displaced right subtrochanteric femur fracture, s/p IM nail.   Clinical Impression  Pt admitted with above. Pt with 8/10 pain with movement in R LE. Pt very resistant to move R hip or knee, pt requiring maxAx2 for all mobility and the use of stedy for safe transfer to the recliner from the bed. Pt unsafe to return home alone and with need ST-SNF to achieve safe mod I level of function for safe transition home. Acute PT to cont to follow.      Follow Up Recommendations SNF;Supervision/Assistance - 24 hour    Equipment Recommendations  None recommended by PT(TBD at next venue)    Recommendations for Other Services       Precautions / Restrictions Precautions Precautions: Fall Restrictions Weight Bearing Restrictions: No Other Position/Activity Restrictions: WBAT      Mobility  Bed Mobility Overal bed mobility: Needs Assistance Bed Mobility: Supine to Sit     Supine to sit: Max assist;+2 for physical assistance     General bed mobility comments: maxA to progress BLE off bed and maxA to progress trunk to upright position;assist to progress hips to EOB  Transfers Overall transfer level: Needs assistance Equipment used: Ambulation equipment used Transfers: Sit to/from Stand Sit to Stand: Mod assist;+2 physical assistance         General transfer comment: modA to progress to standing;assist for knee flexion to position feet prior to stadning  Ambulation/Gait             General Gait Details: unable this date  Stairs            Wheelchair Mobility     Modified Rankin (Stroke Patients Only)       Balance Overall balance assessment: Needs assistance Sitting-balance support: Single extremity supported;Feet supported Sitting balance-Leahy Scale: Poor Sitting balance - Comments: reliant on LUE to support while seated EOB pt able to tolerate short duration unsupported sitting, demonstrated preference for single UE support    Standing balance support: Bilateral upper extremity supported Standing balance-Leahy Scale: Poor Standing balance comment: reliant on BUE support and assist from OT/PT to maintain standing balance                             Pertinent Vitals/Pain Pain Assessment: Faces Faces Pain Scale: Hurts whole lot Pain Location: RLE Pain Descriptors / Indicators: Discomfort;Grimacing;Moaning Pain Intervention(s): Limited activity within patient's tolerance    Home Living Family/patient expects to be discharged to:: Skilled nursing facility                 Additional Comments: Plans for to DC to SNF    Prior Function Level of Independence: Needs assistance   Gait / Transfers Assistance Needed: uses cane for community mobility but not within the home.  ADL's / Homemaking Assistance Needed: reports independence with ADL/IADL  Comments: uses cane for community mobility but not within the home.     Hand Dominance   Dominant Hand: Right    Extremity/Trunk Assessment   Upper Extremity Assessment Upper Extremity Assessment: Defer to OT evaluation RUE Deficits /  Details: decreased shoulder ER/IR;grossly 3-/5previous shoulder fx resulting in limited shoulder flexion ~70degrees;difficulty reaching bar on sara stedy; RUE Sensation: WNL RUE Coordination: decreased gross motor LUE Deficits / Details: limited shoulder flexion ROM ~110 degrees;grossly 3/5    Lower Extremity Assessment Lower Extremity Assessment: RLE deficits/detail RLE Deficits / Details: s/p IM nail, trace quad set, minimal LAQ, pain  with any attempt of ROM at hip or knee    Cervical / Trunk Assessment Cervical / Trunk Assessment: Normal  Communication   Communication: No difficulties  Cognition Arousal/Alertness: Awake/alert Behavior During Therapy: WFL for tasks assessed/performed Overall Cognitive Status: No family/caregiver present to determine baseline cognitive functioning Area of Impairment: Attention;Memory;Safety/judgement;Problem solving                   Current Attention Level: Selective Memory: Decreased short-term memory   Safety/Judgement: Decreased awareness of safety   Problem Solving: Slow processing;Requires verbal cues;Requires tactile cues        General Comments General comments (skin integrity, edema, etc.): VSS    Exercises     Assessment/Plan    PT Assessment Patient needs continued PT services  PT Problem List Decreased strength;Decreased range of motion;Decreased activity tolerance;Decreased balance;Decreased mobility;Decreased coordination;Decreased cognition;Decreased knowledge of use of DME       PT Treatment Interventions DME instruction;Gait training;Stair training;Functional mobility training;Therapeutic activities;Therapeutic exercise;Balance training;Neuromuscular re-education;Cognitive remediation    PT Goals (Current goals can be found in the Care Plan section)  Acute Rehab PT Goals Patient Stated Goal: get stronger PT Goal Formulation: With patient Time For Goal Achievement: 04/24/19 Potential to Achieve Goals: Good    Frequency Min 3X/week   Barriers to discharge Decreased caregiver support lives alone    Co-evaluation PT/OT/SLP Co-Evaluation/Treatment: Yes Reason for Co-Treatment: Complexity of the patient's impairments (multi-system involvement);For patient/therapist safety PT goals addressed during session: Mobility/safety with mobility OT goals addressed during session: ADL's and self-care       AM-PAC PT "6 Clicks" Mobility  Outcome  Measure Help needed turning from your back to your side while in a flat bed without using bedrails?: A Lot Help needed moving from lying on your back to sitting on the side of a flat bed without using bedrails?: A Lot Help needed moving to and from a bed to a chair (including a wheelchair)?: A Lot Help needed standing up from a chair using your arms (e.g., wheelchair or bedside chair)?: Total Help needed to walk in hospital room?: Total Help needed climbing 3-5 steps with a railing? : Total 6 Click Score: 9    End of Session Equipment Utilized During Treatment: Gait belt Activity Tolerance: Patient tolerated treatment well Patient left: in chair;with call bell/phone within reach;with chair alarm set Nurse Communication: Mobility status;Need for lift equipment(use steady to transfer pt back to bed) PT Visit Diagnosis: Unsteadiness on feet (R26.81);Muscle weakness (generalized) (M62.81);Difficulty in walking, not elsewhere classified (R26.2)    Time: 4034-7425 PT Time Calculation (min) (ACUTE ONLY): 37 min   Charges:   PT Evaluation $PT Eval Moderate Complexity: 1 Mod          Melinda Kerr, PT, DPT Acute Rehabilitation Services Pager #: [phone] Office #: [phone]   Melinda Kerr 04/10/2019, 11:42 AM

## 2019-04-10 NOTE — Plan of Care (Signed)
  Problem: Pain Managment: Goal: General experience of comfort will improve Outcome: Progressing   Problem: Safety: Goal: Ability to remain free from injury will improve Outcome: Progressing   Problem: Skin Integrity: Goal: Risk for impaired skin integrity will decrease Outcome: Progressing   

## 2019-04-10 NOTE — Progress Notes (Signed)
    Subjective: Patient reports pain as mild.  Tolerating diet.   No CP, SOB.  Not yet out of bed.  Objective:   VITALS:   Vitals:   04/09/19 1158 04/09/19 1612 04/09/19 2000 04/10/19 0459  BP: (!) 144/75 (!) 102/51 102/60 138/72  Pulse: 80 82 90 81  Resp:   19 19  Temp: 97.8 F (36.6 C) 98.5 F (36.9 C) 98.9 F (37.2 C) 98.4 F (36.9 C)  TempSrc: Oral Oral Oral Oral  SpO2: 95% 99% 99% 100%  Weight:      Height:       CBC Latest Ref Rng & Units 04/09/2019 04/08/2019 11/17/2018  WBC 4.0 - 10.5 K/uL 7.8 11.2(H) 7.7  Hemoglobin 12.0 - 15.0 g/dL 8.5(L) 9.9(L) 8.9(L)  Hematocrit 36.0 - 46.0 % 28.2(L) 33.8(L) 28.9(L)  Platelets 150 - 400 K/uL 272 287 205   BMP Latest Ref Rng & Units 04/10/2019 04/09/2019 04/08/2019  Glucose 70 - 99 mg/dL 134(H) 146(H) 194(H)  BUN 8 - 23 mg/dL 13 9 11   Creatinine 0.44 - 1.00 mg/dL 1.00 0.80 0.87  Sodium 135 - 145 mmol/L 138 134(L) 135  Potassium 3.5 - 5.1 mmol/L 5.0 3.9 4.6  Chloride 98 - 111 mmol/L 103 103 102  CO2 22 - 32 mmol/L 28 26 25   Calcium 8.9 - 10.3 mg/dL 9.0 8.6(L) 9.2   Intake/Output      07/12 0701 - 07/13 0700   P.O. 720   I.V. (mL/kg) 500 (4.6)   Total Intake(mL/kg) 1220 (11.2)   Urine (mL/kg/hr) 600 (0.2)   Blood 30   Total Output 630   Net +590          Physical Exam: General: NAD.  Supine in bed.  Asleep on arrival.  [place] in place.  Calm, conversant. MSK Neurovascularly intact Sensation intact distally Feet warm Dorsiflexion/Plantar flexion intact Incision: dressing C/D/I   Assessment: 1 Day Post-Op  S/P Procedure(s) (LRB): INTRAMEDULLARY (IM) NAIL INTERTROCH (Right) by Dr. Ernesta Amble. Percell Miller on 04/09/2019  Active Problems:   OSA on CPAP   Hypothyroidism   Anemia   Depression   Malignant neoplasm of uterus ([place])   Hiatal hernia   Displaced comminuted fracture of shaft of right femur, initial encounter for closed fracture ([place])   Closed displaced right subtrochanteric femur fracture, s/p IM nail Doing  well postop day 1 Pain controlled Not yet mobilized  Plan: Up with therapy Incentive Spirometry Apply ice PRN  Weightbearing: WBAT RLE Insicional and dressing care: Dressings left intact until follow-up Showering: Keep dressing dry VTE prophylaxis: Lovenox 40mg  qd, SCDs, ambulation Pain control: Minimize narcotics.  Norco, Dilaudid for severe/breakthrough pain. Follow - up plan: 2 weeks Contact information:  Edmonia Lynch MD, Roxan Hockey PA-C  Dispo: Therapy evaluations pending.  Discharge when mobilized and ready medically.  Likely will need SNF.  She lives alone.   Prudencio Burly III, PA-C 04/10/2019, 6:44 AM

## 2019-04-11 ENCOUNTER — Encounter (HOSPITAL_COMMUNITY): Payer: Self-pay | Admitting: Orthopedic Surgery

## 2019-04-11 LAB — GLUCOSE, CAPILLARY
Glucose-Capillary: 115 mg/dL — ABNORMAL HIGH (ref 70–99)
Glucose-Capillary: 158 mg/dL — ABNORMAL HIGH (ref 70–99)
Glucose-Capillary: 159 mg/dL — ABNORMAL HIGH (ref 70–99)
Glucose-Capillary: 153 mg/dL — ABNORMAL HIGH (ref 70–99)

## 2019-04-11 NOTE — Care Management Important Message (Signed)
Important Message  Patient Details  Name: Melinda Kerr MRN: [id number] Date of Birth: December 21, 1934   Medicare Important Message Given:  Yes     Memory Argue 04/11/2019, 12:45 PM    PATIENT HAS SIGNED IM

## 2019-04-11 NOTE — Progress Notes (Signed)
PROGRESS NOTE  Melinda Kerr OIB:[id number] DOB: [birth year] DOA: 04/08/2019 PCP: Maury Dus, MD  Brief History   Melinda Kerr is a 84 y.o. female with medical history significant of CHF, HLD, HTN, hypothyroidism, DM type II, endometrial cancer s/p total robotic hysterectomy, obesity, GI bleed, and OSA on CPAP; who presents after having a fall at approximately 2:30 AM this morning complaining of right knee pain.  Patient reports getting up and feeling as though her right knee gave out prior to her falling onto the floor.  She lives alone and normally ambulates with use of a cane.  She does not usually utilize a cane while in the house.  She was unable to get up off the floor after falling and unable to call for help.  Denies any loss of consciousness, trauma to her head, fever, chills, shortness of breath, palpitations, or chest pain.  Her niece came over to check on her late this morning and found her on the floor and called EMS.  Patient had been admitted into the hospital back in February of this year for GI bleed found to have large hiatal hernia with cameron ulcers.  The patient was found to have a comminuted and angulated fracture of the proximal left femur. [facility] was called to admit and orthopedic surgery was consulted.  The patient was admitted to a medical bed. She went for ORIF this morning with Dr. Percell Miller. She has tolerated the procedure well. She has been evaluated by PT/OT. They recommend SNF prior to discharge to home.  Consultants  . Orthopedic surgery  Procedures  . ORIF left hip  Antibiotics   Anti-infectives (From admission, onward)   Start     Dose/Rate Route Frequency Ordered Stop   04/09/19 1345  ceFAZolin (ANCEF) IVPB 2g/100 mL premix     2 g 200 mL/hr over 30 Minutes Intravenous Every 6 hours 04/09/19 1037 04/09/19 2125   04/09/19 0600  ceFAZolin (ANCEF) IVPB 2g/100 mL premix     2 g 200 mL/hr over 30 Minutes Intravenous On call to O.R. 04/08/19 2211 04/09/19 9450       Subjective  The patient is resting comfortably. No new complaints.  Objective   Vitals:  Vitals:   04/11/19 0932 04/11/19 1300  BP:    Pulse:    Resp:    Temp:    SpO2: 91% 96%    Exam:  Constitutional:  . The patient is awake, alert, and oriented x 3. No acute distress. Respiratory:  . No increased work of breathing. She is saturating in the low to mid nineties on 1L  O2 by nasal cannula this afternoon. . No wheezes, rales, or rhonchi.  . No tactile fremitus. Cardiovascular:  . Regular rate and rhythm . No murmurs, ectopy, or gallups. . No lateral PMI. No thrills. Abdomen:  . Abdomen is soft, non-tender, non-distended . No hernias, masses, or organomegaly are appreciated. Marland Kitchen Hypoactive bowel sounds.  Musculoskeletal:  . Good capillary refill . No cyanosis, clubbing, or edema. Skin:  . No rashes, lesions, ulcers . palpation of skin: no induration or nodules Neurologic:  . CN 2-12 intact . Sensation all 4 extremities intact Psychiatric:  . Affect and mood are appropriate.  I have personally reviewed the following:   Today's Data  . Vitals  Scheduled Meds: . allopurinol  300 mg Oral Daily  . citalopram  20 mg Oral Daily  . docusate sodium  100 mg Oral BID  . enoxaparin (LOVENOX) injection  40 mg  Subcutaneous Q24H  . fesoterodine  8 mg Oral QHS  . furosemide  20 mg Oral Daily  . hydrOXYzine  25 mg Oral QHS  . insulin aspart  0-9 Units Subcutaneous TID WC  . levothyroxine  125 mcg Oral Q0600  . Melatonin  3 mg Oral QHS  . pantoprazole  40 mg Oral BID AC  . polyethylene glycol  17 g Oral Daily  . [START ON 04/13/2019] pravastatin  10 mg Oral Q7 days  . zolpidem  5 mg Oral QHS   Continuous Infusions:   Active Problems:   OSA on CPAP   Hypothyroidism   Anemia   Depression   Malignant neoplasm of uterus ([facility])   Hiatal hernia   Displaced comminuted fracture of shaft of right femur, initial encounter for closed fracture ([place])   LOS: 3 days     A & P   Communicated right proximal femur fracture due to fall: Acute.  Patient reports that her right knee gave out leading to her subsequently falling.  Patient was on the floor for approximately 9 hours prior to being found by her niece.  X-rays reveal communicated right proximal fracture.  Orthopedic surgery has been consulted and the patient has undergone ORIF of the right hip this morning and has tolerated the procedure well. Pain control is with morphine and hydrocodone. PT/OT have recommended SNF placement prior to going home. Patient is awaiting placement.  Mild Rhabdomyolysis: Resolved.  Acyte hypoxic respiratory insufficiency: Resolved. The patient is now saturating in the low to mid nineties on room air. CXR demonstrated bilateral atelectasis. She is on incentive spirometry.  Leukocytosis: Resolved. Reactive and due to fall and fracture. COVID-19 testing negative.   Microcytic anemia: Chronic.  Hemoglobin 9.9 upon admission and 8.5 this am. Previous baseline appears to be 7- 8 g/dL based upon labs obtained during hospitalization in February. Monitor.  Diabetes mellitus type 2: Blood glucose elevated at 198 on admission.  Suspect secondary to acute distress.  Last hemoglobin A1c noted to be 6.2 on 10/26/2018. The patient's glucoses will be followed with FSBS and SSI. She is on a carbohydrate controlled diet and a hypoglycemic protocol. FSBS 115 - 158 in the last 24 hours.  Diastolic CHF: Last echocardiogram obtained on 10/28/2018 noting EF of 60 to 65% with grade 2 diastolic dysfunction.  Patient appears to be euvolemic at this time.  Patient on furosemide 20 mg daily. She will receive cautious IV fluids due to mild rhabdomyolysis. Monitor creatinine, electrolytes, and volume status. Stable.  Hypothyroidism: Continue levothyroxine as at home.  Depression: Continue Celexa as at home.  Hyperlipidemia: Continue pravastatin as at home.  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 Patient is status post adjuvant chemotherapy with 6 cycles of carboplatin and Taxol tassel and vaginal brachytherapy completed in February 2018.  Continue outpatient follow-up with Dr. Denman George  Hiatal hernia, GERD, history of GI bleed: Patient was previously admitted into the hospital in February of this year for GI bleed.  EGD revealed large hiatal hernia with Lysbeth Galas ulcers as likely cause of symptoms. She will be continued on Protonix as at home.  I have seen and examined this patient myself. I have spent 28 minutes in her evaluation and care.  DVT prophylaxis: Lovenox Code Status: Full Family Communication: None available. Disposition Plan: Likely we will need rehab at discharge  Janne Faulk, DO [facility] Direct contact: see [email or web address]  7PM-7AM  contact night coverage as above  04/10/2019, 1:27 PM  LOS: 1 day .

## 2019-04-11 NOTE — Progress Notes (Signed)
    Subjective: Patient reports pain as mild.  Tolerating diet.   No CP, SOB.  Slow to mobilize.  Objective:   VITALS:   Vitals:   04/10/19 1310 04/10/19 1352 04/10/19 2000 04/11/19 0352  BP:  (!) 105/54 127/69 139/66  Pulse: 92 85 93 83  Resp:  18 18 18   Temp:  98.4 F (36.9 C) 99 F (37.2 C) 98.2 F (36.8 C)  TempSrc:  Oral Oral Oral  SpO2: 92% 93% 90% 95%  Weight:      Height:       CBC Latest Ref Rng & Units 04/09/2019 04/08/2019 11/17/2018  WBC 4.0 - 10.5 K/uL 7.8 11.2(H) 7.7  Hemoglobin 12.0 - 15.0 g/dL 8.5(L) 9.9(L) 8.9(L)  Hematocrit 36.0 - 46.0 % 28.2(L) 33.8(L) 28.9(L)  Platelets 150 - 400 K/uL 272 287 205   BMP Latest Ref Rng & Units 04/10/2019 04/09/2019 04/08/2019  Glucose 70 - 99 mg/dL 134(H) 146(H) 194(H)  BUN 8 - 23 mg/dL 13 9 11   Creatinine 0.44 - 1.00 mg/dL 1.00 0.80 0.87  Sodium 135 - 145 mmol/L 138 134(L) 135  Potassium 3.5 - 5.1 mmol/L 5.0 3.9 4.6  Chloride 98 - 111 mmol/L 103 103 102  CO2 22 - 32 mmol/L 28 26 25   Calcium 8.9 - 10.3 mg/dL 9.0 8.6(L) 9.2   Intake/Output      07/13 0701 - 07/14 0700   P.O. 720   Total Intake(mL/kg) 720 (6.6)   Urine (mL/kg/hr) 2200 (0.8)   Total Output 2200   Net -1480       Urine Occurrence 3 x      Physical Exam: General: NAD.  Supine in bed.  Calm, conversant. MSK Neurovascularly intact Sensation intact distally Feet warm Dorsiflexion/Plantar flexion intact Incision: dressing C/D/I   Assessment: 2 Days Post-Op  S/P Procedure(s) (LRB): INTRAMEDULLARY (IM) NAIL INTERTROCH (Right) by Dr. Ernesta Amble. Percell Miller on 04/09/2019  Active Problems:   OSA on CPAP   Hypothyroidism   Anemia   Depression   Malignant neoplasm of uterus ([place])   Hiatal hernia   Displaced comminuted fracture of shaft of right femur, initial encounter for closed fracture ([place])   Closed displaced right subtrochanteric femur fracture, s/p IM nail Doing well postop day 2 Pain controlled Slow to mobilize  Plan: Up with therapy  Incentive Spirometry Apply ice PRN  Weightbearing: WBAT RLE Insicional and dressing care: Dressings left intact until follow-up Showering: Keep dressing dry VTE prophylaxis: Lovenox 40mg  qd, SCDs, ambulation Pain control: Minimize narcotics.  Norco, Dilaudid for severe/breakthrough pain. Follow - up plan: 2 weeks Contact information:  Edmonia Lynch MD, Roxan Hockey PA-C  Dispo: Therapy evaluations ongoing.  Discharge when mobilized and ready medically.  SNF recommended.  She lives alone.   Melinda Elizabeth Martensen III, PA-C 04/11/2019, 6:40 AM

## 2019-04-11 NOTE — Progress Notes (Signed)
Physical Therapy Treatment Patient Details Name: Melinda Kerr MRN: [id number] DOB: 1935-04-30 Today's Date: 04/11/2019    History of Present Illness Melinda Kerr is a 84 y.o. female with medical history significant of CHF, HLD, HTN, hypothyroidism, DM type II, endometrial cancer s/p total robotic hysterectomy, obesity, GI bleed, and OSA on CPAP; who presents after having a fall resulting in a closed displaced right subtrochanteric femur fracture, s/p IM nail.     PT Comments    Pt slowly progressing, pt did tolerate mobility better today from a pain stand point. Pt  Remains to have minimal active R LE movement, once EOB pt with more active LAQ in R LE however trace quad set. Pt with minimal R LE WBing tolerance as well. Cont to require stedy for safe transfer to chair. Acute PT to cont to follow.  Cont to recommend SNF upon d/c.     Follow Up Recommendations  SNF;Supervision/Assistance - 24 hour     Equipment Recommendations  None recommended by PT    Recommendations for Other Services       Precautions / Restrictions Precautions Precautions: Fall Precaution Comments: morbidly obese Restrictions Weight Bearing Restrictions: Yes RLE Weight Bearing: Weight bearing as tolerated    Mobility  Bed Mobility Overal bed mobility: Needs Assistance Bed Mobility: Supine to Sit     Supine to sit: Max assist;+2 for physical assistance     General bed mobility comments: maxA for R LE management, maxAX2 for trunk elevation and to bring to EOB  Transfers Overall transfer level: Needs assistance Equipment used: Ambulation equipment used Transfers: Sit to/from Stand Sit to Stand: Mod assist;+2 physical assistance         General transfer comment: modAx2 to power up, completed 3 sit to stand, pt unable to extend UEs to achieve full upright position. Attempted to march in place however pt unable to tolerate R LE WBing to raise L LE off surface  Ambulation/Gait              General Gait Details: unable this date   Stairs             Wheelchair Mobility    Modified Rankin (Stroke Patients Only)       Balance Overall balance assessment: Needs assistance Sitting-balance support: Single extremity supported;Feet supported Sitting balance-Leahy Scale: Poor Sitting balance - Comments: reliant on LUE to support while seated EOB pt able to tolerate short duration unsupported sitting, demonstrated preference for single UE support    Standing balance support: Bilateral upper extremity supported Standing balance-Leahy Scale: Poor Standing balance comment: reliant on BUE support and assist from OT/PT to maintain standing balance                            Cognition Arousal/Alertness: Awake/alert Behavior During Therapy: WFL for tasks assessed/performed Overall Cognitive Status: No family/caregiver present to determine baseline cognitive functioning Area of Impairment: Attention;Memory;Safety/judgement;Problem solving                   Current Attention Level: Sustained Memory: Decreased short-term memory(didn't do HEP)   Safety/Judgement: Decreased awareness of safety   Problem Solving: Slow processing;Difficulty sequencing;Requires verbal cues;Requires tactile cues General Comments: pt very distracted by pain, hard time following commands      Exercises General Exercises - Lower Extremity Quad Sets: (pt with trace quad set) Gluteal Sets: AROM;5 reps;Seated Long Arc Quad: AAROM;10 reps;Seated    General Comments General comments (  skin integrity, edema, etc.): VSS      Pertinent Vitals/Pain Pain Assessment: Faces Faces Pain Scale: Hurts whole lot Pain Location: R LE Pain Descriptors / Indicators: Discomfort;Grimacing;Moaning Pain Intervention(s): Limited activity within patient's tolerance    Home Living Family/patient expects to be discharged to:: Skilled nursing facility                    Prior Function             PT Goals (current goals can now be found in the care plan section) Progress towards PT goals: Progressing toward goals    Frequency    Min 3X/week      PT Plan Current plan remains appropriate    Co-evaluation              AM-PAC PT "6 Clicks" Mobility   Outcome Measure  Help needed turning from your back to your side while in a flat bed without using bedrails?: A Lot Help needed moving from lying on your back to sitting on the side of a flat bed without using bedrails?: A Lot Help needed moving to and from a bed to a chair (including a wheelchair)?: A Lot Help needed standing up from a chair using your arms (e.g., wheelchair or bedside chair)?: Total Help needed to walk in hospital room?: Total Help needed climbing 3-5 steps with a railing? : Total 6 Click Score: 9    End of Session Equipment Utilized During Treatment: Gait belt Activity Tolerance: Patient tolerated treatment well Patient left: in chair;with call bell/phone within reach;with chair alarm set Nurse Communication: Mobility status;Need for lift equipment PT Visit Diagnosis: Unsteadiness on feet (R26.81);Muscle weakness (generalized) (M62.81);Difficulty in walking, not elsewhere classified (R26.2)     Time: 9758-8325 PT Time Calculation (min) (ACUTE ONLY): 28 min  Charges:  $Gait Training: 8-22 mins $Therapeutic Exercise: 8-22 mins                     Kittie Plater, PT, DPT Acute Rehabilitation Services Pager #: [phone] Office #: [phone]    Berline Lopes 04/11/2019, 10:04 AM

## 2019-04-12 LAB — CBC WITH DIFFERENTIAL/PLATELET
Abs Immature Granulocytes: 0.09 K/uL — ABNORMAL HIGH (ref 0.00–0.07)
Basophils Absolute: 0 K/uL (ref 0.0–0.1)
Basophils Relative: 0 %
Eosinophils Absolute: 0 K/uL (ref 0.0–0.5)
Eosinophils Relative: 0 %
HCT: 23.4 % — ABNORMAL LOW (ref 36.0–46.0)
Hemoglobin: 7 g/dL — ABNORMAL LOW (ref 12.0–15.0)
Immature Granulocytes: 1 %
Lymphocytes Relative: 18 %
Lymphs Abs: 1.9 K/uL (ref 0.7–4.0)
MCH: 23.6 pg — ABNORMAL LOW (ref 26.0–34.0)
MCHC: 29.9 g/dL — ABNORMAL LOW (ref 30.0–36.0)
MCV: 78.8 fL — ABNORMAL LOW (ref 80.0–100.0)
Monocytes Absolute: 1.1 K/uL — ABNORMAL HIGH (ref 0.1–1.0)
Monocytes Relative: 10 %
Neutro Abs: 7.6 K/uL (ref 1.7–7.7)
Neutrophils Relative %: 71 %
Platelets: 245 K/uL (ref 150–400)
RBC: 2.97 MIL/uL — ABNORMAL LOW (ref 3.87–5.11)
RDW: 18.6 % — ABNORMAL HIGH (ref 11.5–15.5)
WBC: 10.7 K/uL — ABNORMAL HIGH (ref 4.0–10.5)
nRBC: 0.2 % (ref 0.0–0.2)

## 2019-04-12 LAB — BASIC METABOLIC PANEL
Anion gap: 8 (ref 5–15)
BUN: 25 mg/dL — ABNORMAL HIGH (ref 8–23)
CO2: 28 mmol/L (ref 22–32)
Calcium: 8.7 mg/dL — ABNORMAL LOW (ref 8.9–10.3)
Chloride: 101 mmol/L (ref 98–111)
Creatinine, Ser: 0.79 mg/dL (ref 0.44–1.00)
GFR calc Af Amer: >60 mL/min (ref >60)
GFR calc non Af Amer: >60 mL/min (ref >60)
Glucose, Bld: 128 mg/dL — ABNORMAL HIGH (ref 70–99)
Potassium: 4.3 mmol/L (ref 3.5–5.1)
Sodium: 137 mmol/L (ref 135–145)

## 2019-04-12 LAB — GLUCOSE, CAPILLARY
Glucose-Capillary: 120 mg/dL — ABNORMAL HIGH (ref 70–99)
Glucose-Capillary: 156 mg/dL — ABNORMAL HIGH (ref 70–99)
Glucose-Capillary: 125 mg/dL — ABNORMAL HIGH (ref 70–99)
Glucose-Capillary: 149 mg/dL — ABNORMAL HIGH (ref 70–99)

## 2019-04-12 MED ORDER — BACLOFEN 10 MG PO TABS: 10.0000 mg | ORAL_TABLET | Freq: Three times a day (TID) | ORAL | 0 refills | Status: DC | PRN

## 2019-04-12 MED ORDER — DOCUSATE SODIUM 100 MG PO CAPS: 100.0000 mg | ORAL_CAPSULE | Freq: Two times a day (BID) | ORAL | 0 refills | Status: DC

## 2019-04-12 NOTE — TOC Initial Note (Signed)
Transition of Care [facility]) - Initial/Assessment Note    Patient Details  Name: Melinda Kerr MRN: [id number] Date of Birth: [birth year]  [facility]) CM/SW Contact:    Alberteen Sam, [place] Phone Number: [phone] 04/12/2019, 3:03 PM  Clinical Narrative:                  CSW consulted with patient regarding discharge plan, she reports being in agreement with going to SNF at discharge for short term rehab with preference for [facility]. Patient gave CSW permission to send referral to [facility] for bed offer, as patient reports she was there earlier this year and really liked her stay there. CSW will follow up with patient on if they have bed avail.   Expected Discharge Plan: Skilled Nursing Facility Barriers to Discharge: Continued Medical Work up   Patient Goals and CMS Choice Patient states their goals for this hospitalization and ongoing recovery are:: to go to [facility] then go home CMS Medicare.gov Compare Post Acute Care list provided to:: Patient Choice offered to / list presented to : Patient  Expected Discharge Plan and Services Expected Discharge Plan: [place] Choice: [place] arrangements for the past 2 months: Single Family Home                                      Prior Living Arrangements/Services Living arrangements for the past 2 months: Single Family Home Lives with:: Self Patient language and need for interpreter reviewed:: Yes Do you feel safe going back to the place where you live?: No   needs short term rehab  Need for Family Participation in Patient Care: Yes (Comment) Care giver support system in place?: Yes (comment)   Criminal Activity/Legal Involvement Pertinent to Current Situation/Hospitalization: No - Comment as needed  Activities of Daily Living Home Assistive Devices/Equipment: Bedside commode/3-in-1, Walker (specify type) ADL Screening (condition at time of  admission) Patient's cognitive ability adequate to safely complete daily activities?: Yes Is the patient deaf or have difficulty hearing?: No Does the patient have difficulty seeing, even when wearing glasses/contacts?: No Does the patient have difficulty concentrating, remembering, or making decisions?: No Patient able to express need for assistance with ADLs?: Yes Does the patient have difficulty dressing or bathing?: Yes Independently performs ADLs?: Yes (appropriate for developmental age) Does the patient have difficulty walking or climbing stairs?: Yes Weakness of Legs: Right Weakness of Arms/Hands: Right  Permission Sought/Granted Permission sought to share information with : Case Manager, Investment banker, corporate granted to share info w AGENCY: SNFs        Emotional Assessment Appearance:: Appears stated age Attitude/Demeanor/Rapport: Gracious Affect (typically observed): Calm Orientation: : Oriented to Self, Oriented to Place, Oriented to  Time, Oriented to Situation Alcohol / Substance Use: Not Applicable Psych Involvement: No (comment)  Admission diagnosis:  Fall, initial encounter [W19.XXXA] Closed right hip fracture, initial encounter [facility]) [S72.001A] Patient Active Problem List   Diagnosis Date Noted  . Displaced comminuted fracture of shaft of right femur, initial encounter for closed fracture ([place]) 04/08/2019  . GIB (gastrointestinal bleeding) 11/13/2018  . Anemia associated with acute blood loss 11/13/2018  . Closed fracture of right proximal humerus 10/26/2018  . S/P reverse total shoulder arthroplasty, right 10/26/2018  . Acute appendicitis   . Diverticulitis of large  intestine with abscess 07/13/2017  . Colitis 07/13/2017  . Aortic atherosclerosis ([place]) 09/12/2016  . Coronary artery calcification 09/12/2016  . Hiatal hernia 09/12/2016  . Lumbar disc disease 09/12/2016  . Chemotherapy-induced thrombocytopenia 08/04/2016  .  Chemotherapy induced neutropenia ([place]) 05/26/2016  . Port catheter in place 05/26/2016  . Encounter for antineoplastic chemotherapy 05/26/2016  . Urinary frequency 04/24/2016  . Obstructive sleep apnea on CPAP 04/24/2016  . History of DVT (deep vein thrombosis) 04/24/2016  . Endometrial cancer, FIGO stage IIIC ([place]) 04/24/2016  . Malignant neoplasm of endometrium ([place]) 03/26/2016  . Dyspnea on exertion 03/19/2016  . Malignant neoplasm of uterus ([place]) 03/19/2016  . Status post right knee replacement 04/16/2014  . Essential hypertension 04/16/2014  . Postoperative anemia due to acute blood loss 04/12/2014  . Status post total left knee replacement 10/14/2013  . Hypothyroidism 10/14/2013  . UI (urinary incontinence) 10/14/2013  . Dyslipidemia 10/14/2013  . Constipation 10/14/2013  . Gout 10/14/2013  . Anemia 10/14/2013  . Allergic rhinitis 10/14/2013  . Insomnia 10/14/2013  . Depression 10/14/2013  . OA (osteoarthritis) of knee 10/09/2013  . OSA on CPAP 08/10/2013  . Morbid obesity with BMI of 45.0-49.9, adult ([place])    PCP:  Maury Dus, MD Pharmacy:   [facility] [phone] [address], [place] [address] [place] [address] Phone: [phone] Fax: [phone]     Social Determinants of Health (SDOH) Interventions    Readmission Risk Interventions No flowsheet data found.

## 2019-04-12 NOTE — Progress Notes (Signed)
Physical Therapy Treatment Patient Details Name: Melinda Kerr MRN: [id number] DOB: Jun 24, 1935 Today's Date: 04/12/2019    History of Present Illness Melinda Kerr is a 84 y.o. female with medical history significant of CHF, HLD, HTN, hypothyroidism, DM type II, endometrial cancer s/p total robotic hysterectomy, obesity, GI bleed, and OSA on CPAP; who presents after having a fall resulting in a closed displaced right subtrochanteric femur fracture, s/p IM nail.     PT Comments    Pt performed multiple transfers during session ranging from mod to max assistance +2.  On final trial from low commode she required max +3 to achieve standing.  Pt continues to progress slowly and remains limited due to B UE/LE weakness.  Continue to recommend snf placement to improve strength and funciton before returning home.      Follow Up Recommendations  SNF;Supervision/Assistance - 24 hour     Equipment Recommendations  None recommended by PT    Recommendations for Other Services       Precautions / Restrictions Precautions Precautions: Fall Precaution Comments: morbidly obese Restrictions Weight Bearing Restrictions: Yes RLE Weight Bearing: Weight bearing as tolerated    Mobility  Bed Mobility Overal bed mobility: Needs Assistance Bed Mobility: Supine to Sit;Sit to Supine     Supine to sit: Max assist;+2 for physical assistance     General bed mobility comments: Max +2 to advance LEs to edge of bed and to elevate trunk into sitting.  Pt with poor sitting balance edge of bed.  Transfers Overall transfer level: Needs assistance Equipment used: Ambulation equipment used;None;Rolling walker (2 wheeled) Transfers: Sit to/from Stand;Squat Pivot Transfers(Performed x1 with mod +2 from elevate bed height,  FACE to face x1 max +2 tech assisted with B hips to pivot to commode.  Once on commode unable to stand due to low position performed transfer with sara steady  +58max  assistance to achieve  standing) Sit to Stand: Mod assist;Max assist;+2 physical assistance(ranged from mod +2 to max +3.  As patient fatigues she requires increased assistance.)   Squat pivot transfers: Max assist;+2 physical assistance     General transfer comment: Mod + 2 to power up from edge of elevated bed x1 with RW.  Pt started to pee in standing and unable to achieve full upright stance.  Performed FACE to [facility] transfers from bed to bedside commde with max +2 via squat pivot.  Once seated on commode unable to rise from seat.  required +3 assistance to achieve standing while tech braced toilet to allow her hips to move out of bed side commode.  Pt fatigues as trial progressed and subsequently requires increased asistance.  Ambulation/Gait Ambulation/Gait assistance: (NT remains unable to stand more that 2 seconds gt is not appropriate at this time.)               Stairs             Wheelchair Mobility    Modified Rankin (Stroke Patients Only)       Balance Overall balance assessment: Needs assistance Sitting-balance support: Single extremity supported;Feet supported Sitting balance-Leahy Scale: Poor Sitting balance - Comments: reliant on LUE to support while seated EOB pt able to tolerate short duration unsupported sitting, demonstrated preference for single UE support    Standing balance support: Bilateral upper extremity supported Standing balance-Leahy Scale: Poor Standing balance comment: reliant on BUE support and assist from2-3 people to maintain standing balance  Cognition Arousal/Alertness: Awake/alert Behavior During Therapy: WFL for tasks assessed/performed Overall Cognitive Status: No family/caregiver present to determine baseline cognitive functioning Area of Impairment: Attention;Memory;Safety/judgement;Problem solving                   Current Attention Level: Sustained Memory: Decreased short-term memory    Safety/Judgement: Decreased awareness of safety   Problem Solving: Slow processing;Difficulty sequencing;Requires verbal cues;Requires tactile cues General Comments: pt very distracted by pain, hard time following commands      Exercises      General Comments        Pertinent Vitals/Pain Pain Assessment: Faces Faces Pain Scale: Hurts little more Pain Location: R LE Pain Descriptors / Indicators: Discomfort;Grimacing;Moaning Pain Intervention(s): Monitored during session;Repositioned    Home Living                      Prior Function            PT Goals (current goals can now be found in the care plan section) Acute Rehab PT Goals Patient Stated Goal: get stronger Potential to Achieve Goals: Good Progress towards PT goals: Progressing toward goals    Frequency    Min 3X/week      PT Plan Current plan remains appropriate    Co-evaluation              AM-PAC PT "6 Clicks" Mobility   Outcome Measure  Help needed turning from your back to your side while in a flat bed without using bedrails?: Total Help needed moving from lying on your back to sitting on the side of a flat bed without using bedrails?: Total Help needed moving to and from a bed to a chair (including a wheelchair)?: Total Help needed standing up from a chair using your arms (e.g., wheelchair or bedside chair)?: Total Help needed to walk in hospital room?: Total Help needed climbing 3-5 steps with a railing? : Total 6 Click Score: 6    End of Session Equipment Utilized During Treatment: Gait belt Activity Tolerance: Patient tolerated treatment well Patient left: in chair;with call bell/phone within reach;with chair alarm set Nurse Communication: Mobility status;Need for lift equipment PT Visit Diagnosis: Unsteadiness on feet (R26.81);Muscle weakness (generalized) (M62.81);Difficulty in walking, not elsewhere classified (R26.2)     Time: 5056-9794 PT Time Calculation (min)  (ACUTE ONLY): 40 min  Charges:  $Therapeutic Activity: 38-52 mins                     Governor Rooks, PTA Acute Rehabilitation Services Pager [phone] Office [phone]     [name] [name] 04/12/2019, 2:58 PM

## 2019-04-12 NOTE — Progress Notes (Signed)
RN spoke with pt's niece, [name], updated her on plan of care. All questions answered.

## 2019-04-12 NOTE — Progress Notes (Signed)
PROGRESS NOTE  Melinda Kerr ZOX:[id number] DOB: 03-16-1935 DOA: 04/08/2019 PCP: Maury Dus, MD  Brief History   [place] ANIELLE HEADRICK is a 84 y.o. female with medical history significant of CHF, HLD, HTN, hypothyroidism, DM type II, endometrial cancer s/p total robotic hysterectomy, obesity, GI bleed, and OSA on CPAP; who presents after having a fall at approximately 2:30 AM this morning complaining of right knee pain.  Patient reports getting up and feeling as though her right knee gave out prior to her falling onto the floor.  She lives alone and normally ambulates with use of a cane.  She does not usually utilize a cane while in the house.  She was unable to get up off the floor after falling and unable to call for help.  Denies any loss of consciousness, trauma to her head, fever, chills, shortness of breath, palpitations, or chest pain.  Her niece came over to check on her late this morning and found her on the floor and called EMS.  Patient had been admitted into the hospital back in February of this year for GI bleed found to have large hiatal hernia with cameron ulcers.  The patient was found to have a comminuted and angulated fracture of the proximal left femur. TRH was called to admit and orthopedic surgery was consulted.  The patient was admitted to a medical bed. She went for ORIF this morning with Dr. Percell Miller. She has tolerated the procedure well. She has been evaluated by PT/OT. They recommend SNF prior to returning home.  Consultants  . Orthopedic surgery  Procedures  . ORIF left hip  Antibiotics   Anti-infectives (From admission, onward)   Start     Dose/Rate Route Frequency Ordered Stop   04/09/19 1345  ceFAZolin (ANCEF) IVPB 2g/100 mL premix     2 g 200 mL/hr over 30 Minutes Intravenous Every 6 hours 04/09/19 1037 04/09/19 2125   04/09/19 0600  ceFAZolin (ANCEF) IVPB 2g/100 mL premix     2 g 200 mL/hr over 30 Minutes Intravenous On call to O.R. 04/08/19 2211 04/09/19 8119       Subjective  The patient is resting comfortably. No new complaints.  Objective   Vitals:  Vitals:   04/12/19 0415 04/12/19 0755  BP: 136/68 106/88  Pulse: 82 76  Resp: 16 16  Temp: 100.2 F (37.9 C) 98.4 F (36.9 C)  SpO2: 92% 96%    Exam:  Constitutional:  . The patient is awake, alert, and oriented x 3. No acute distress. Respiratory:  . No increased work of breathing. She is saturating in the low to mid nineties on 1L  O2 by nasal cannula this afternoon. . No wheezes, rales, or rhonchi.  . No tactile fremitus. Cardiovascular:  . Regular rate and rhythm . No murmurs, ectopy, or gallups. . No lateral PMI. No thrills. Abdomen:  . Abdomen is soft, non-tender, non-distended . No hernias, masses, or organomegaly are appreciated. Marland Kitchen Hypoactive bowel sounds.  Musculoskeletal:  . Good capillary refill . No cyanosis, clubbing, or edema. Skin:  . No rashes, lesions, ulcers . palpation of skin: no induration or nodules Neurologic:  . CN 2-12 intact . Sensation all 4 extremities intact Psychiatric:  . Affect and mood are appropriate.  I have personally reviewed the following:   Today's Data  . Vitals  Scheduled Meds: . allopurinol  300 mg Oral Daily  . citalopram  20 mg Oral Daily  . docusate sodium  100 mg Oral BID  . enoxaparin (  LOVENOX) injection  40 mg Subcutaneous Q24H  . fesoterodine  8 mg Oral QHS  . furosemide  20 mg Oral Daily  . hydrOXYzine  25 mg Oral QHS  . insulin aspart  0-9 Units Subcutaneous TID WC  . levothyroxine  125 mcg Oral Q0600  . Melatonin  3 mg Oral QHS  . pantoprazole  40 mg Oral BID AC  . polyethylene glycol  17 g Oral Daily  . [START ON 04/13/2019] pravastatin  10 mg Oral Q7 days  . zolpidem  5 mg Oral QHS   Continuous Infusions:   Active Problems:   OSA on CPAP   Hypothyroidism   Anemia   Depression   Malignant neoplasm of uterus (HCC)   Hiatal hernia   Displaced comminuted fracture of shaft of right femur, initial  encounter for closed fracture ([place])   LOS: 4 days    A & P   Communicated right proximal femur fracture due to fall: Acute.  Patient reports that her right knee gave out leading to her subsequently falling.  Patient was on the floor for approximately 9 hours prior to being found by her niece.  X-rays reveal communicated right proximal fracture.  Orthopedic surgery has been consulted and the patient has undergone ORIF of the right hip this morning and has tolerated the procedure well. Pain control is with morphine and hydrocodone. PT/OT have recommended SNF placement prior to going home. Patient is awaiting placement.  Mild Rhabdomyolysis: Resolved.  Acyte hypoxic respiratory insufficiency: Resolved. The patient is now saturating in the low to mid nineties on room air. CXR demonstrated bilateral atelectasis. She is on incentive spirometry.  Leukocytosis: Resolved. Reactive and due to fall and fracture. COVID-19 testing negative.   Microcytic anemia: Chronic.  Hemoglobin 9.9 upon admission and 8.5 this am. Previous baseline appears to be 7- 8 g/dL based upon labs obtained during hospitalization in February. Monitor.  Diabetes mellitus type 2: Blood glucose elevated at 198 on admission.  Suspect secondary to acute distress.  Last hemoglobin A1c noted to be 6.2 on 10/26/2018. The patient's glucoses will be followed with FSBS and SSI. She is on a carbohydrate controlled diet and a hypoglycemic protocol. FSBS 115 - 158 in the last 24 hours.  Diastolic CHF: Last echocardiogram obtained on 10/28/2018 noting EF of 60 to 65% with grade 2 diastolic dysfunction.  Patient appears to be euvolemic at this time.  Patient on furosemide 20 mg daily. She will receive cautious IV fluids due to mild rhabdomyolysis. Monitor creatinine, electrolytes, and volume status. Stable.  Hypothyroidism: Continue levothyroxine as at home.  Depression: Continue Celexa as at home.  Hyperlipidemia: Continue pravastatin as  at home.  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 Patient is status post adjuvant chemotherapy with 6 cycles of carboplatin and Taxol tassel and vaginal brachytherapy completed in February 2018.  Continue outpatient follow-up with Dr. Denman George  Hiatal hernia, GERD, history of GI bleed: Patient was previously admitted into the hospital in February of this year for GI bleed.  EGD revealed large hiatal hernia with Lysbeth Galas ulcers as likely cause of symptoms. She will be continued on Protonix as at home.  I have seen and examined this patient myself. I have spent 30 minutes in her evaluation and care.  DVT prophylaxis: Lovenox Code Status: Full Family Communication: None available. Disposition Plan: Likely we will need rehab at discharge  Aziel Morgan, DO [facility] Direct  contact: see [email or web address]  7PM-7AM contact night coverage as above  04/12/2019, 11:35 PM  LOS: 1 day .

## 2019-04-12 NOTE — Plan of Care (Signed)
  Problem: Health Behavior/Discharge Planning: Goal: Ability to manage health-related needs will improve Outcome: Progressing   Problem: Clinical Measurements: Goal: Respiratory complications will improve Outcome: Progressing   Problem: Activity: Goal: Risk for activity intolerance will decrease Outcome: Progressing   

## 2019-04-12 NOTE — NC FL2 (Signed)
[place] LEVEL OF CARE SCREENING TOOL     IDENTIFICATION  Patient Name: Melinda Kerr Birthdate: [birth year] Sex: female Admission Date (Current Location): 04/08/2019  [facility] [place] Number:  Herbalist and Address:  [facility], [place] [address], [place], [place] [address]      Provider Number: [id number]  Attending Physician Name and Address:  Karie Kirks, DO  Relative Name and Phone Number:  [name] (WCHEN)[phone]    Current Level of Care: Hospital Recommended Level of Care: [place] Prior Approval Number:    Date Approved/Denied:   PASRR Number: [id number]  Discharge Plan: SNF    Current Diagnoses: Patient Active Problem List   Diagnosis Date Noted  . Displaced comminuted fracture of shaft of right femur, initial encounter for closed fracture ([place]) 04/08/2019  . GIB (gastrointestinal bleeding) 11/13/2018  . Anemia associated with acute blood loss 11/13/2018  . Closed fracture of right proximal humerus 10/26/2018  . S/P reverse total shoulder arthroplasty, right 10/26/2018  . Acute appendicitis   . Diverticulitis of large intestine with abscess 07/13/2017  . Colitis 07/13/2017  . Aortic atherosclerosis ([place]) 09/12/2016  . Coronary artery calcification 09/12/2016  . Hiatal hernia 09/12/2016  . Lumbar disc disease 09/12/2016  . Chemotherapy-induced thrombocytopenia 08/04/2016  . Chemotherapy induced neutropenia ([place]) 05/26/2016  . Port catheter in place 05/26/2016  . Encounter for antineoplastic chemotherapy 05/26/2016  . Urinary frequency 04/24/2016  . Obstructive sleep apnea on CPAP 04/24/2016  . History of DVT (deep vein thrombosis) 04/24/2016  . Endometrial cancer, FIGO stage IIIC ([place]) 04/24/2016  . Malignant neoplasm of endometrium ([place]) 03/26/2016  . Dyspnea on exertion 03/19/2016  . Malignant neoplasm of uterus ([place]) 03/19/2016  . Status post right knee replacement 04/16/2014  .  Essential hypertension 04/16/2014  . Postoperative anemia due to acute blood loss 04/12/2014  . Status post total left knee replacement 10/14/2013  . Hypothyroidism 10/14/2013  . UI (urinary incontinence) 10/14/2013  . Dyslipidemia 10/14/2013  . Constipation 10/14/2013  . Gout 10/14/2013  . Anemia 10/14/2013  . Allergic rhinitis 10/14/2013  . Insomnia 10/14/2013  . Depression 10/14/2013  . OA (osteoarthritis) of knee 10/09/2013  . OSA on CPAP 08/10/2013  . Morbid obesity with BMI of 45.0-49.9, adult (HCC)     Orientation RESPIRATION BLADDER Height & Weight     Self, Time, Situation, Place  Normal Continent, External catheter Weight: 248 lb 12.8 oz (112.9 kg) Height:  5\' 5"  (165.1 cm)  BEHAVIORAL SYMPTOMS/MOOD NEUROLOGICAL BOWEL NUTRITION STATUS      Continent Diet(see discharge summary)  AMBULATORY STATUS COMMUNICATION OF NEEDS Skin   Extensive Assist Verbally Surgical wounds(right hip closed surgical incision, right shoulder closed surgical incision)                       Personal Care Assistance Level of Assistance  Bathing, Feeding, Dressing, Total care Bathing Assistance: Limited assistance Feeding assistance: Independent Dressing Assistance: Limited assistance Total Care Assistance: Maximum assistance   Functional Limitations Info  Sight, Hearing, Speech Sight Info: Adequate Hearing Info: Adequate Speech Info: Adequate    SPECIAL CARE FACTORS FREQUENCY  PT (By licensed PT), OT (By licensed OT)     PT Frequency: min 5x weekly OT Frequency: min 5x weekly            Contractures Contractures Info: Not present    Additional Factors Info  Code Status, Allergies Code Status Info: Full Allergies Info: Oxybutynin  Current Medications (04/12/2019):  This is the current hospital active medication list Current Facility-Administered Medications  Medication Dose Route Frequency Provider Last Rate Last Dose  . acetaminophen (TYLENOL) tablet  325-650 mg  325-650 mg Oral Q6H PRN Prudencio Burly III, PA-C      . allopurinol (ZYLOPRIM) tablet 300 mg  300 mg Oral Kerr Prudencio Burly III, PA-C   300 mg at 04/12/19 0801  . baclofen (LIORESAL) tablet 10 mg  10 mg Oral TID PRN Prudencio Burly III, PA-C      . bisacodyl (DULCOLAX) suppository 10 mg  10 mg Rectal Kerr PRN Prudencio Burly III, PA-C   10 mg at 04/11/19 1758  . citalopram (CELEXA) tablet 20 mg  20 mg Oral Kerr Prudencio Burly III, PA-C   20 mg at 04/12/19 0801  . docusate sodium (COLACE) capsule 100 mg  100 mg Oral BID Prudencio Burly III, PA-C   100 mg at 04/12/19 0802  . enoxaparin (LOVENOX) injection 40 mg  40 mg Subcutaneous Q24H Prudencio Burly III, PA-C   40 mg at 04/11/19 1704  . fesoterodine (TOVIAZ) tablet 8 mg  8 mg Oral QHS Prudencio Burly III, PA-C   8 mg at 04/11/19 2202  . furosemide (LASIX) tablet 20 mg  20 mg Oral Kerr Prudencio Burly III, PA-C   20 mg at 04/12/19 0801  . HYDROcodone-acetaminophen (NORCO/VICODIN) 5-325 MG per tablet 1-2 tablet  1-2 tablet Oral Q4H PRN Prudencio Burly III, PA-C   1 tablet at 04/12/19 1253  . HYDROmorphone (DILAUDID) injection 0.5 mg  0.5 mg Intravenous Q4H PRN Prudencio Burly III, PA-C      . hydrOXYzine (ATARAX/VISTARIL) tablet 25 mg  25 mg Oral QHS Prudencio Burly III, PA-C   25 mg at 04/11/19 2202  . insulin aspart (novoLOG) injection 0-9 Units  0-9 Units Subcutaneous TID WC Prudencio Burly III, PA-C   2 Units at 04/12/19 1253  . levothyroxine (SYNTHROID) tablet 125 mcg  125 mcg Oral Q0600 Prudencio Burly III, PA-C   125 mcg at 04/12/19 0522  . Melatonin TABS 3 mg  3 mg Oral QHS Prudencio Burly III, PA-C   3 mg at 04/11/19 2202  . metoCLOPramide (REGLAN) tablet 5-10 mg  5-10 mg Oral Q8H PRN Prudencio Burly III, PA-C       Or  . metoCLOPramide (REGLAN) injection 5-10 mg  5-10 mg Intravenous Q8H PRN  Prudencio Burly III, PA-C      . ondansetron [facility]) tablet 4 mg  4 mg Oral Q6H PRN Prudencio Burly III, PA-C       Or  . ondansetron [facility]) injection 4 mg  4 mg Intravenous Q6H PRN Prudencio Burly III, PA-C      . pantoprazole (PROTONIX) EC tablet 40 mg  40 mg Oral BID AC Prudencio Burly III, PA-C   40 mg at 04/12/19 0758  . polyethylene glycol (MIRALAX / GLYCOLAX) packet 17 g  17 g Oral Kerr Prudencio Burly III, PA-C   17 g at 04/12/19 0539  . polyethylene glycol (MIRALAX / GLYCOLAX) packet 17 g  17 g Oral Kerr PRN Prudencio Burly III, PA-C   17 g at 04/10/19 0819  . [START ON 04/13/2019] pravastatin (PRAVACHOL) tablet 10 mg  10 mg Oral Q7 days Prudencio Burly III, PA-C      . sodium phosphate (FLEET) 7-19 GM/118ML enema 1 enema  1  enema Rectal Once PRN Prudencio Burly III, PA-C      . zolpidem (AMBIEN) tablet 5 mg  5 mg Oral QHS Prudencio Burly III, PA-C   5 mg at 04/11/19 2201     Discharge Medications: Please see discharge summary for a list of discharge medications.  Relevant Imaging Results:  Relevant Lab Results:   Additional Information SSN: [id number]  Alberteen Sam, LCSW

## 2019-04-12 NOTE — Progress Notes (Signed)
    Subjective: Patient reports pain as mild, improving steadily.  Tolerating diet.   No CP, SOB.  Slow but improving mobilization.  Objective:   VITALS:   Vitals:   04/11/19 1745 04/11/19 2000 04/12/19 0415 04/12/19 0525  BP: 108/63 129/61 136/68   Pulse: 80 95 82   Resp:  18 16   Temp: 99.2 F (37.3 C) 99.3 F (37.4 C) 100.2 F (37.9 C)   TempSrc: Oral Oral Oral   SpO2: 94% 90% 92%   Weight:    112.9 kg  Height:       CBC Latest Ref Rng & Units 04/12/2019 04/09/2019 04/08/2019  WBC 4.0 - 10.5 K/uL 10.7(H) 7.8 11.2(H)  Hemoglobin 12.0 - 15.0 g/dL 7.0(L) 8.5(L) 9.9(L)  Hematocrit 36.0 - 46.0 % 23.4(L) 28.2(L) 33.8(L)  Platelets 150 - 400 K/uL 245 272 287   BMP Latest Ref Rng & Units 04/12/2019 04/10/2019 04/09/2019  Glucose 70 - 99 mg/dL 128(H) 134(H) 146(H)  BUN 8 - 23 mg/dL 25(H) 13 9  Creatinine 0.44 - 1.00 mg/dL 0.79 1.00 0.80  Sodium 135 - 145 mmol/L 137 138 134(L)  Potassium 3.5 - 5.1 mmol/L 4.3 5.0 3.9  Chloride 98 - 111 mmol/L 101 103 103  CO2 22 - 32 mmol/L 28 28 26   Calcium 8.9 - 10.3 mg/dL 8.7(L) 9.0 8.6(L)   Intake/Output      07/14 0701 - 07/15 0700 07/15 0701 - 07/16 0700   P.O. 720    Total Intake(mL/kg) 720 (6.4)    Urine (mL/kg/hr) 2600 (1)    Stool 0    Total Output 2600    Net -1880         Urine Occurrence 1 x    Stool Occurrence 1 x       Physical Exam: General: NAD.  Upright in bed.  Calm, talkative. MSK Neurovascularly intact Sensation intact distally Feet warm Dorsiflexion/Plantar flexion intact Incision: dressing C/D/I   Assessment: 3 Days Post-Op  S/P Procedure(s) (LRB): INTRAMEDULLARY (IM) NAIL INTERTROCH (Right) by Dr. Ernesta Amble. Percell Miller on 04/09/2019  Active Problems:   OSA on CPAP   Hypothyroidism   Anemia   Depression   Malignant neoplasm of uterus ([place])   Hiatal hernia   Displaced comminuted fracture of shaft of right femur, initial encounter for closed fracture ([place])   Closed displaced right subtrochanteric femur  fracture, s/p IM nail Doing well postop day 3 Pain controlled Slow to mobilize Hemoglobin down to 7.0<8.5.  This is acute on chronic with history of GI bleed.  Denies symptoms.  Plan: Up with therapy Incentive Spirometry Apply ice PRN  Weightbearing: WBAT RLE Insicional and dressing care: Dressings left intact until follow-up Showering: Keep dressing dry VTE prophylaxis: Lovenox 40mg  qd, SCDs, ambulation Pain control: Minimize narcotics.  Norco, Dilaudid for severe/breakthrough pain. Follow - up plan: 2 weeks Contact information:  Edmonia Lynch MD, Roxan Hockey PA-C  Dispo: Therapy evaluations ongoing.  SNF recommended-patient would like to go to [facility].  She lives alone.  Discharge when ready medically.      Follow up in the office with Dr. Alain Marion in 2 weeks.  Please call with questions.   Charna Elizabeth Martensen III, PA-C 04/12/2019, 7:49 AM

## 2019-04-13 DIAGNOSIS — R0989 Other specified symptoms and signs involving the circulatory and respiratory systems: Secondary | ICD-10-CM | POA: Diagnosis not present

## 2019-04-13 DIAGNOSIS — M25561 Pain in right knee: Secondary | ICD-10-CM | POA: Diagnosis not present

## 2019-04-13 DIAGNOSIS — Z7901 Long term (current) use of anticoagulants: Secondary | ICD-10-CM | POA: Diagnosis not present

## 2019-04-13 DIAGNOSIS — E039 Hypothyroidism, unspecified: Secondary | ICD-10-CM | POA: Diagnosis not present

## 2019-04-13 DIAGNOSIS — F329 Major depressive disorder, single episode, unspecified: Secondary | ICD-10-CM | POA: Diagnosis not present

## 2019-04-13 DIAGNOSIS — M25551 Pain in right hip: Secondary | ICD-10-CM | POA: Diagnosis not present

## 2019-04-13 DIAGNOSIS — F4320 Adjustment disorder, unspecified: Secondary | ICD-10-CM | POA: Diagnosis not present

## 2019-04-13 DIAGNOSIS — E538 Deficiency of other specified B group vitamins: Secondary | ICD-10-CM | POA: Diagnosis not present

## 2019-04-13 DIAGNOSIS — M62838 Other muscle spasm: Secondary | ICD-10-CM | POA: Diagnosis not present

## 2019-04-13 DIAGNOSIS — L299 Pruritus, unspecified: Secondary | ICD-10-CM | POA: Diagnosis not present

## 2019-04-13 DIAGNOSIS — R531 Weakness: Secondary | ICD-10-CM | POA: Diagnosis not present

## 2019-04-13 DIAGNOSIS — S72351A Displaced comminuted fracture of shaft of right femur, initial encounter for closed fracture: Secondary | ICD-10-CM | POA: Diagnosis not present

## 2019-04-13 DIAGNOSIS — K219 Gastro-esophageal reflux disease without esophagitis: Secondary | ICD-10-CM | POA: Diagnosis not present

## 2019-04-13 DIAGNOSIS — M6281 Muscle weakness (generalized): Secondary | ICD-10-CM | POA: Diagnosis not present

## 2019-04-13 DIAGNOSIS — R52 Pain, unspecified: Secondary | ICD-10-CM | POA: Diagnosis not present

## 2019-04-13 DIAGNOSIS — M109 Gout, unspecified: Secondary | ICD-10-CM | POA: Diagnosis not present

## 2019-04-13 DIAGNOSIS — R2681 Unsteadiness on feet: Secondary | ICD-10-CM | POA: Diagnosis not present

## 2019-04-13 DIAGNOSIS — R489 Unspecified symbolic dysfunctions: Secondary | ICD-10-CM | POA: Diagnosis not present

## 2019-04-13 DIAGNOSIS — C541 Malignant neoplasm of endometrium: Secondary | ICD-10-CM | POA: Diagnosis not present

## 2019-04-13 DIAGNOSIS — S72141D Displaced intertrochanteric fracture of right femur, subsequent encounter for closed fracture with routine healing: Secondary | ICD-10-CM | POA: Diagnosis not present

## 2019-04-13 DIAGNOSIS — E038 Other specified hypothyroidism: Secondary | ICD-10-CM | POA: Diagnosis not present

## 2019-04-13 DIAGNOSIS — S79929A Unspecified injury of unspecified thigh, initial encounter: Secondary | ICD-10-CM | POA: Diagnosis not present

## 2019-04-13 DIAGNOSIS — Z4801 Encounter for change or removal of surgical wound dressing: Secondary | ICD-10-CM | POA: Diagnosis not present

## 2019-04-13 DIAGNOSIS — I10 Essential (primary) hypertension: Secondary | ICD-10-CM | POA: Diagnosis not present

## 2019-04-13 DIAGNOSIS — Z9989 Dependence on other enabling machines and devices: Secondary | ICD-10-CM | POA: Diagnosis not present

## 2019-04-13 DIAGNOSIS — Z7401 Bed confinement status: Secondary | ICD-10-CM | POA: Diagnosis not present

## 2019-04-13 DIAGNOSIS — D649 Anemia, unspecified: Secondary | ICD-10-CM | POA: Diagnosis not present

## 2019-04-13 DIAGNOSIS — M810 Age-related osteoporosis without current pathological fracture: Secondary | ICD-10-CM | POA: Diagnosis not present

## 2019-04-13 DIAGNOSIS — Z20828 Contact with and (suspected) exposure to other viral communicable diseases: Secondary | ICD-10-CM | POA: Diagnosis not present

## 2019-04-13 DIAGNOSIS — K5900 Constipation, unspecified: Secondary | ICD-10-CM | POA: Diagnosis not present

## 2019-04-13 DIAGNOSIS — W19XXXD Unspecified fall, subsequent encounter: Secondary | ICD-10-CM | POA: Diagnosis not present

## 2019-04-13 DIAGNOSIS — M2550 Pain in unspecified joint: Secondary | ICD-10-CM | POA: Diagnosis not present

## 2019-04-13 DIAGNOSIS — K449 Diaphragmatic hernia without obstruction or gangrene: Secondary | ICD-10-CM | POA: Diagnosis not present

## 2019-04-13 DIAGNOSIS — R05 Cough: Secondary | ICD-10-CM | POA: Diagnosis not present

## 2019-04-13 DIAGNOSIS — J309 Allergic rhinitis, unspecified: Secondary | ICD-10-CM | POA: Diagnosis not present

## 2019-04-13 DIAGNOSIS — S72351D Displaced comminuted fracture of shaft of right femur, subsequent encounter for closed fracture with routine healing: Secondary | ICD-10-CM | POA: Diagnosis not present

## 2019-04-13 DIAGNOSIS — G4700 Insomnia, unspecified: Secondary | ICD-10-CM | POA: Diagnosis not present

## 2019-04-13 DIAGNOSIS — G4733 Obstructive sleep apnea (adult) (pediatric): Secondary | ICD-10-CM | POA: Diagnosis not present

## 2019-04-13 DIAGNOSIS — D509 Iron deficiency anemia, unspecified: Secondary | ICD-10-CM | POA: Diagnosis not present

## 2019-04-13 DIAGNOSIS — Z4789 Encounter for other orthopedic aftercare: Secondary | ICD-10-CM | POA: Diagnosis not present

## 2019-04-13 DIAGNOSIS — M79604 Pain in right leg: Secondary | ICD-10-CM | POA: Diagnosis not present

## 2019-04-13 DIAGNOSIS — R1319 Other dysphagia: Secondary | ICD-10-CM | POA: Diagnosis not present

## 2019-04-13 DIAGNOSIS — K30 Functional dyspepsia: Secondary | ICD-10-CM | POA: Diagnosis not present

## 2019-04-13 DIAGNOSIS — I5030 Unspecified diastolic (congestive) heart failure: Secondary | ICD-10-CM | POA: Diagnosis not present

## 2019-04-13 LAB — CBC WITH DIFFERENTIAL/PLATELET
Abs Immature Granulocytes: 0.07 10*3/uL (ref 0.00–0.07)
Basophils Absolute: 0 10*3/uL (ref 0.0–0.1)
Basophils Relative: 0 %
Eosinophils Absolute: 0.3 10*3/uL (ref 0.0–0.5)
Eosinophils Relative: 4 %
HCT: 25.1 % — ABNORMAL LOW (ref 36.0–46.0)
Hemoglobin: 7.4 g/dL — ABNORMAL LOW (ref 12.0–15.0)
Immature Granulocytes: 1 %
Lymphocytes Relative: 22 %
Lymphs Abs: 1.6 10*3/uL (ref 0.7–4.0)
MCH: 23.6 pg — ABNORMAL LOW (ref 26.0–34.0)
MCHC: 29.5 g/dL — ABNORMAL LOW (ref 30.0–36.0)
MCV: 79.9 fL — ABNORMAL LOW (ref 80.0–100.0)
Monocytes Absolute: 0.8 10*3/uL (ref 0.1–1.0)
Monocytes Relative: 11 %
Neutro Abs: 4.4 10*3/uL (ref 1.7–7.7)
Neutrophils Relative %: 62 %
Platelets: 250 10*3/uL (ref 150–400)
RBC: 3.14 MIL/uL — ABNORMAL LOW (ref 3.87–5.11)
RDW: 18.9 % — ABNORMAL HIGH (ref 11.5–15.5)
WBC: 7.1 10*3/uL (ref 4.0–10.5)
nRBC: 0.3 % — ABNORMAL HIGH (ref 0.0–0.2)

## 2019-04-13 LAB — GLUCOSE, CAPILLARY
Glucose-Capillary: 113 mg/dL — ABNORMAL HIGH (ref 70–99)
Glucose-Capillary: 125 mg/dL — ABNORMAL HIGH (ref 70–99)

## 2019-04-13 MED ORDER — ACETAMINOPHEN 325 MG PO TABS: 650.0000 mg | ORAL_TABLET | Freq: Four times a day (QID) | ORAL | 0 refills | Status: DC | PRN

## 2019-04-13 NOTE — Plan of Care (Signed)
  Problem: Safety: Goal: Ability to remain free from injury will improve Outcome: Progressing   Problem: Skin Integrity: Goal: Risk for impaired skin integrity will decrease Outcome: Progressing   

## 2019-04-13 NOTE — Progress Notes (Signed)
Patient going to [facility] room 208 sometime this afternoon; patient report given to receiving nurse;[name] Yow (niece) updated;waiting for transport.

## 2019-04-13 NOTE — Discharge Summary (Signed)
Physician Discharge Summary  Patient ID: Melinda Kerr MRN: [id number] DOB/AGE: 05-31-1935 84 y.o.  Admit date: 04/08/2019 Discharge date: 04/13/2019  Admission Diagnoses:  Discharge Diagnoses:  Active Problems:   Displaced comminuted fracture of shaft of right femur, initial encounter for closed fracture (HCC)   OSA on CPAP   Hypothyroidism   Anemia   Depression   Malignant neoplasm of uterus ([place])   Hiatal hernia    Discharged Condition: stable  Hospital Course: Patient is an 84 year old female with past medical history significant for congestive heart failure, hyperlipidemia, hypertension, hypothyroidism, DM type II, endometrial cancer s/ptotal robotic hysterectomy, obesity,GI bleed,and OSA on CPAP.  Patient was admitted following a fall with right knee pain.  Apparently, patient could not get up from the floor of 34.  Imaging studies done revealed a comminuted and angulated fracture of the proximal right femur.  Patient was admitted for further assessment and management.  Patient underwent intramedullary nailing of the comminuted proximal right fracture.  Patient has been cleared for discharge.  Patient be discharged to skilled nursing facility.  Patient will follow with the primary care provider and the orthopedic team within 1 week of discharge.  Right femoral fracture: Kindly see above. Orthopedic input is highly appreciated. Patient be discharged to skilled nursing facility. Patient will need to follow-up with a primary care provider and orthopedic team on discharge.   Mild Rhabdomyolysis:  Resolved.  Acyte hypoxic respiratory insufficiency:  Resolved. T CXR demonstrated bilateral atelectasis.  Patient improved with incentive spirometry.    Leukocytosis:  Resolved.  Likely reactive COVID-19 testing negative.   Microcytic anemia:  -Chronic.  -On admission, hemoglobin was 9.9 g/dL.  Hemoglobin has trended downwards during the hospital stay.   -Hemoglobin  prior to discharge was 7.4 g/dL.   -Continue to monitor H&H closely.    Diabetes mellitus type 2: Continue to optimize.   Diastolic CHF, chronic: -Stable.   -No symptoms.   -Last echocardiogram was on 10/28/2018.  Echo revealed EF of 60 to 65% with grade 2 diastolic dysfunction.    Hypothyroidism:  Continue levothyroxine.    Depression:  Continue Celexa.   Hyperlipidemia:  Continue pravastatin.    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 Patient is status post adjuvant chemotherapy with 6 cycles of carboplatin and Taxol tassel and vaginal brachytherapy completed in February 2018.Continue outpatient follow-up with Dr. Denman George  Hiatal hernia,GERD, history of GI bleed:  Patient was previously admitted into the hospital in February of this year for GI bleed. EGD revealed large hiatal hernia with Lysbeth Galas ulcers.  Continue Protonix.  Consults: orthopedic surgery  Treatments: Intramedullary nailing of right comminuted and displaced intertrochanteric femur fracture.   Discharge Exam: Blood pressure (!) 109/54, pulse 77, temperature (!) 97.4 F (36.3 C), temperature source Oral, resp. rate 16, height 5\' 5"  (1.651 m), weight 112.9 kg, SpO2 90 %.   Disposition: Discharge disposition: 03-Skilled Nursing Facility   Discharge Instructions    Activity as tolerated - No restrictions   Complete by: As directed    Call MD for:  persistant nausea and vomiting   Complete by: As directed    Call MD for:  severe uncontrolled pain   Complete by: As directed    Diet - low sodium heart healthy   Complete by: As directed    Diet Carb Modified   Complete by: As directed    Discharge instructions   Complete by:  As directed    PT/OT eval and treat Follow up with PCP in 7-10 days after discharge from SNF. Follow up with orthopedic surgery as directed.    Increase activity slowly   Complete by: As directed    Increase activity slowly   Complete by: As directed      Allergies as of 04/13/2019      Reactions   Oxybutynin Other (See Comments)   Bleeding gums, felt sick      Medication List    STOP taking these medications   traMADol 50 MG tablet Commonly known as: ULTRAM     TAKE these medications   acetaminophen 325 MG tablet Commonly known as: TYLENOL Take 2 tablets (650 mg total) by mouth every 6 (six) hours as needed for mild pain or moderate pain (pain score 1-3 or temp > 100.5). What changed:   medication strength  how much to take  when to take this  reasons to take this   alendronate 70 MG tablet Commonly known as: FOSAMAX Take 70 mg by mouth every Friday. Take with a full glass of water on an empty stomach.   allopurinol 300 MG tablet Commonly known as: ZYLOPRIM Take 300 mg by mouth daily.   baclofen 10 MG tablet Commonly known as: LIORESAL Take 1 tablet (10 mg total) by mouth 3 (three) times daily as needed for muscle spasms.   calcium gluconate 500 MG tablet Take 1 tablet by mouth daily.   cetirizine 10 MG tablet Commonly known as: ZYRTEC Take 10 mg by mouth daily.   citalopram 20 MG tablet Commonly known as: CELEXA Take 20 mg by mouth daily.   docusate sodium 100 MG capsule Commonly known as: COLACE Take 1 capsule (100 mg total) by mouth 2 (two) times daily.   enoxaparin 40 MG/0.4ML injection Commonly known as: LOVENOX Inject 0.4 mLs (40 mg total) into the skin daily for 30 doses. For 30 days post op for DVT prophylaxis   Fish Oil 1000 MG Caps Take 1,000 mg by mouth daily.   furosemide 20 MG tablet Commonly known as: LASIX Take 20 mg by mouth daily.   HYDROcodone-acetaminophen 5-325 MG tablet Commonly known as: Norco Take 1-2 tablets by mouth every 6 (six) hours as needed for severe pain (Use tylenol for mild and moderate pain).   hydrOXYzine 25 MG tablet Commonly known as:  ATARAX/VISTARIL Take 25 mg by mouth at bedtime.   levothyroxine 125 MCG tablet Commonly known as: SYNTHROID Take 125 mcg by mouth daily at 6 (six) AM.   Melatonin 3 MG Tabs Take 3 mg by mouth at bedtime.   nystatin powder Generic drug: nystatin Apply topically See admin instructions. Apply topically to abdominal skin folds and under bilateral breast for intertrigo - twice daily - please make sure skin has been cleaned and is dry before applying   pantoprazole 40 MG tablet Commonly known as: PROTONIX Take 1 tablet (40 mg total) by mouth 2 (two) times daily before a meal.   polyethylene glycol 17 g packet Commonly known as: MIRALAX / GLYCOLAX Take 17 g by mouth daily as needed for mild constipation or moderate constipation. What changed: when to take this   pravastatin 10 MG tablet Commonly known as: PRAVACHOL Take 10 mg by mouth every Thursday. At bedtime   pyridOXINE 100 MG tablet Commonly known as: VITAMIN B-6 Take 100 mg by mouth daily.   tolterodine 4 MG 24 hr capsule Commonly known as: DETROL LA Take 4 mg by mouth  every evening.   vitamin B-12 100 MCG tablet Commonly known as: CYANOCOBALAMIN Take 100 mcg by mouth daily.   vitamin C 500 MG tablet Commonly known as: ASCORBIC ACID Take 500 mg by mouth daily.   zolpidem 10 MG tablet Commonly known as: AMBIEN Take 10 mg by mouth at bedtime.       Contact information for follow-up providers    Renette Butters, MD. Schedule an appointment as soon as possible for a visit in 2 weeks.   Specialty: Orthopedic Surgery Contact information: [address] [place] [address] [phone]            Contact information for after-discharge care    Destination    [facility] Preferred SNF .   Service: Skilled Nursing Contact information: [place] [phone]                  Signed: Bonnell Public 04/13/2019, 12:42 PM

## 2019-04-13 NOTE — TOC Transition Note (Signed)
Transition of Care [facility]) - CM/SW Discharge Note   Patient Details  Name: MICHAELIA BEILFUSS MRN: [id number] Date of Birth: April 04, 1935  Transition of Care [facility]) CM/SW Contact:  Alberteen Sam, LCSW Phone Number: 04/13/2019, 1:40 PM   Clinical Narrative:     Patient will DC to: [facility] Anticipated DC date: 04/13/2019 Family notified: [name] Transport RR:[facility]  Per MD patient ready for DC to [facility]. RN, patient, patient's family, and facility notified of DC. Discharge Summary sent to facility. RN given number for report [phone] . DC packet on chart. Ambulance transport requested for patient.  CSW signing off.  [place], [place]   Final next level of care: Skilled Nursing Facility Barriers to Discharge: No Barriers Identified   Patient Goals and CMS Choice Patient states their goals for this hospitalization and ongoing recovery are:: to go to [facility] then go home CMS Medicare.gov Compare Post Acute Care list provided to:: Patient Choice offered to / list presented to : Patient  Discharge Placement PASRR number recieved: 04/12/19            Patient chooses bed at: [facility] Patient to be transferred to facility by: [place] Name of family member notified: [name] Patient and family notified of of transfer: 04/13/19  Discharge Plan and Services     Post Acute Care Choice: [place]                               Social Determinants of Health (SDOH) Interventions     Readmission Risk Interventions No flowsheet data found.

## 2019-04-16 DIAGNOSIS — D649 Anemia, unspecified: Secondary | ICD-10-CM | POA: Diagnosis not present

## 2019-04-16 DIAGNOSIS — K5900 Constipation, unspecified: Secondary | ICD-10-CM | POA: Diagnosis not present

## 2019-04-16 DIAGNOSIS — M810 Age-related osteoporosis without current pathological fracture: Secondary | ICD-10-CM | POA: Diagnosis not present

## 2019-04-16 DIAGNOSIS — R2681 Unsteadiness on feet: Secondary | ICD-10-CM | POA: Diagnosis not present

## 2019-04-16 DIAGNOSIS — G4733 Obstructive sleep apnea (adult) (pediatric): Secondary | ICD-10-CM | POA: Diagnosis not present

## 2019-04-16 DIAGNOSIS — E038 Other specified hypothyroidism: Secondary | ICD-10-CM | POA: Diagnosis not present

## 2019-04-16 DIAGNOSIS — S72351D Displaced comminuted fracture of shaft of right femur, subsequent encounter for closed fracture with routine healing: Secondary | ICD-10-CM | POA: Diagnosis not present

## 2019-04-21 ENCOUNTER — Other Ambulatory Visit: Payer: Self-pay | Admitting: *Deleted

## 2019-04-21 NOTE — Patient Outreach (Signed)
Member assessed for potential [facility] Care Management needs as a benefit of  [place] Medicare.  Member is currently at [facility] receiving rehab therapy.   Spoke with (covering)  [facility] RN who indicates facility reports that member likely to remain long term care at facility. Progress reportedly limited by member's pain.  Should member remain long term at facility, writer will sign off.  Discussed that Probation officer will continue to follow member while at [facility] and collaborate with team about disposition plans and potential [facility] Care Management needs.       Marthenia Rolling, MSN-Ed, RN,BSN [place] Acute Care Coordinator [phone] [facility]) [phone]  (Toll free office)

## 2019-04-27 ENCOUNTER — Other Ambulatory Visit: Payer: Self-pay | Admitting: *Deleted

## 2019-04-27 NOTE — Patient Outreach (Signed)
Member assessed for potential [facility] Care Management needs as a benefit of  [place] Medicare.  Member is currently receiving rehab therapy at [facility] SNF.   Member discussed with [facility] UM RN after her weekly telephonic IDT meeting with [facility] facility staff.  [facility] UM RN confirms, per facilty, that Mrs. Trefry will remain long term care.  Writer will sign off as there are no identifiable [facility] Care Management needs at this time.   Marthenia Rolling, MSN-Ed, RN,BSN [place] Acute Care Coordinator [phone] [facility]) [phone]  (Toll free office)

## 2019-04-28 DIAGNOSIS — S72141D Displaced intertrochanteric fracture of right femur, subsequent encounter for closed fracture with routine healing: Secondary | ICD-10-CM | POA: Diagnosis not present

## 2019-05-12 ENCOUNTER — Other Ambulatory Visit: Payer: Self-pay | Admitting: *Deleted

## 2019-05-12 NOTE — Patient Outreach (Signed)
Member assessed for potential [facility] Care Management needs as a benefit of  [place] Medicare.  Member is currently receiving rehab therapy at [facility].  Update received from [place] after her weekly telephonic IDT meeting with [facility] SN[facility] facility staff.  Mrs. Cherian will transition to long term care at the facility likely this week.   Writer will sign off. No identifiable [facility] Care Management needs at this time.   Marthenia Rolling, MSN-Ed, RN,BSN [place] Acute Care Coordinator [phone] [facility]) [phone]  (Toll free office)

## 2019-05-14 ENCOUNTER — Encounter (HOSPITAL_COMMUNITY): Payer: Self-pay

## 2019-05-14 ENCOUNTER — Inpatient Hospital Stay (HOSPITAL_COMMUNITY)
Admission: EM | Admit: 2019-05-14 | Discharge: 2019-05-16 | DRG: 065 | Disposition: A | Discharge status: Skilled Nursing Facility | Payer: Medicare Other | Source: Skilled Nursing Facility | Attending: Internal Medicine | Admitting: Internal Medicine

## 2019-05-14 ENCOUNTER — Other Ambulatory Visit: Payer: Self-pay

## 2019-05-14 ENCOUNTER — Emergency Department (HOSPITAL_COMMUNITY): Payer: Medicare Other

## 2019-05-14 ENCOUNTER — Inpatient Hospital Stay (HOSPITAL_COMMUNITY): Payer: Medicare Other

## 2019-05-14 DIAGNOSIS — I700 Atherosclerosis of aorta: Secondary | ICD-10-CM | POA: Diagnosis present

## 2019-05-14 DIAGNOSIS — I639 Cerebral infarction, unspecified: Secondary | ICD-10-CM | POA: Diagnosis present

## 2019-05-14 DIAGNOSIS — E785 Hyperlipidemia, unspecified: Secondary | ICD-10-CM | POA: Diagnosis not present

## 2019-05-14 DIAGNOSIS — R402252 Coma scale, best verbal response, oriented, at arrival to emergency department: Secondary | ICD-10-CM | POA: Diagnosis present

## 2019-05-14 DIAGNOSIS — E669 Obesity, unspecified: Secondary | ICD-10-CM | POA: Diagnosis not present

## 2019-05-14 DIAGNOSIS — R402142 Coma scale, eyes open, spontaneous, at arrival to emergency department: Secondary | ICD-10-CM | POA: Diagnosis present

## 2019-05-14 DIAGNOSIS — I6300 Cerebral infarction due to thrombosis of unspecified precerebral artery: Secondary | ICD-10-CM | POA: Diagnosis not present

## 2019-05-14 DIAGNOSIS — E119 Type 2 diabetes mellitus without complications: Secondary | ICD-10-CM | POA: Diagnosis present

## 2019-05-14 DIAGNOSIS — Z6841 Body Mass Index (BMI) 40.0 and over, adult: Secondary | ICD-10-CM

## 2019-05-14 DIAGNOSIS — R2681 Unsteadiness on feet: Secondary | ICD-10-CM | POA: Diagnosis not present

## 2019-05-14 DIAGNOSIS — E7800 Pure hypercholesterolemia, unspecified: Secondary | ICD-10-CM | POA: Diagnosis present

## 2019-05-14 DIAGNOSIS — Z79899 Other long term (current) drug therapy: Secondary | ICD-10-CM

## 2019-05-14 DIAGNOSIS — I10 Essential (primary) hypertension: Secondary | ICD-10-CM | POA: Diagnosis present

## 2019-05-14 DIAGNOSIS — Z79890 Hormone replacement therapy (postmenopausal): Secondary | ICD-10-CM

## 2019-05-14 DIAGNOSIS — G4700 Insomnia, unspecified: Secondary | ICD-10-CM | POA: Diagnosis present

## 2019-05-14 DIAGNOSIS — F419 Anxiety disorder, unspecified: Secondary | ICD-10-CM | POA: Diagnosis not present

## 2019-05-14 DIAGNOSIS — E1159 Type 2 diabetes mellitus with other circulatory complications: Secondary | ICD-10-CM

## 2019-05-14 DIAGNOSIS — G8191 Hemiplegia, unspecified affecting right dominant side: Secondary | ICD-10-CM | POA: Diagnosis present

## 2019-05-14 DIAGNOSIS — R0602 Shortness of breath: Secondary | ICD-10-CM

## 2019-05-14 DIAGNOSIS — Z8249 Family history of ischemic heart disease and other diseases of the circulatory system: Secondary | ICD-10-CM

## 2019-05-14 DIAGNOSIS — Z888 Allergy status to other drugs, medicaments and biological substances status: Secondary | ICD-10-CM

## 2019-05-14 DIAGNOSIS — I63412 Cerebral infarction due to embolism of left middle cerebral artery: Secondary | ICD-10-CM | POA: Diagnosis present

## 2019-05-14 DIAGNOSIS — R29701 NIHSS score 1: Secondary | ICD-10-CM | POA: Diagnosis present

## 2019-05-14 DIAGNOSIS — G459 Transient cerebral ischemic attack, unspecified: Secondary | ICD-10-CM | POA: Diagnosis present

## 2019-05-14 DIAGNOSIS — D509 Iron deficiency anemia, unspecified: Secondary | ICD-10-CM | POA: Diagnosis present

## 2019-05-14 DIAGNOSIS — I69314 Frontal lobe and executive function deficit following cerebral infarction: Secondary | ICD-10-CM | POA: Diagnosis not present

## 2019-05-14 DIAGNOSIS — I5032 Chronic diastolic (congestive) heart failure: Secondary | ICD-10-CM | POA: Diagnosis present

## 2019-05-14 DIAGNOSIS — Z7401 Bed confinement status: Secondary | ICD-10-CM | POA: Diagnosis not present

## 2019-05-14 DIAGNOSIS — R52 Pain, unspecified: Secondary | ICD-10-CM | POA: Diagnosis not present

## 2019-05-14 DIAGNOSIS — S7291XA Unspecified fracture of right femur, initial encounter for closed fracture: Secondary | ICD-10-CM | POA: Diagnosis not present

## 2019-05-14 DIAGNOSIS — M109 Gout, unspecified: Secondary | ICD-10-CM | POA: Diagnosis present

## 2019-05-14 DIAGNOSIS — G4733 Obstructive sleep apnea (adult) (pediatric): Secondary | ICD-10-CM | POA: Diagnosis present

## 2019-05-14 DIAGNOSIS — Z86718 Personal history of other venous thrombosis and embolism: Secondary | ICD-10-CM

## 2019-05-14 DIAGNOSIS — K5900 Constipation, unspecified: Secondary | ICD-10-CM | POA: Diagnosis present

## 2019-05-14 DIAGNOSIS — R0902 Hypoxemia: Secondary | ICD-10-CM | POA: Diagnosis present

## 2019-05-14 DIAGNOSIS — F329 Major depressive disorder, single episode, unspecified: Secondary | ICD-10-CM | POA: Diagnosis present

## 2019-05-14 DIAGNOSIS — R29810 Facial weakness: Secondary | ICD-10-CM | POA: Diagnosis present

## 2019-05-14 DIAGNOSIS — I63532 Cerebral infarction due to unspecified occlusion or stenosis of left posterior cerebral artery: Secondary | ICD-10-CM | POA: Diagnosis not present

## 2019-05-14 DIAGNOSIS — F32A Depression, unspecified: Secondary | ICD-10-CM | POA: Diagnosis present

## 2019-05-14 DIAGNOSIS — Z20828 Contact with and (suspected) exposure to other viral communicable diseases: Secondary | ICD-10-CM | POA: Diagnosis not present

## 2019-05-14 DIAGNOSIS — K449 Diaphragmatic hernia without obstruction or gangrene: Secondary | ICD-10-CM | POA: Diagnosis present

## 2019-05-14 DIAGNOSIS — Z7901 Long term (current) use of anticoagulants: Secondary | ICD-10-CM

## 2019-05-14 DIAGNOSIS — I469 Cardiac arrest, cause unspecified: Secondary | ICD-10-CM | POA: Diagnosis not present

## 2019-05-14 DIAGNOSIS — I6521 Occlusion and stenosis of right carotid artery: Secondary | ICD-10-CM | POA: Diagnosis not present

## 2019-05-14 DIAGNOSIS — R402362 Coma scale, best motor response, obeys commands, at arrival to emergency department: Secondary | ICD-10-CM | POA: Diagnosis present

## 2019-05-14 DIAGNOSIS — R4702 Dysphasia: Secondary | ICD-10-CM | POA: Diagnosis present

## 2019-05-14 DIAGNOSIS — I110 Hypertensive heart disease with heart failure: Secondary | ICD-10-CM | POA: Diagnosis present

## 2019-05-14 DIAGNOSIS — E039 Hypothyroidism, unspecified: Secondary | ICD-10-CM | POA: Diagnosis not present

## 2019-05-14 DIAGNOSIS — E1169 Type 2 diabetes mellitus with other specified complication: Secondary | ICD-10-CM | POA: Diagnosis not present

## 2019-05-14 DIAGNOSIS — M79651 Pain in right thigh: Secondary | ICD-10-CM | POA: Diagnosis not present

## 2019-05-14 DIAGNOSIS — I6389 Other cerebral infarction: Secondary | ICD-10-CM | POA: Diagnosis not present

## 2019-05-14 DIAGNOSIS — Z209 Contact with and (suspected) exposure to unspecified communicable disease: Secondary | ICD-10-CM | POA: Diagnosis not present

## 2019-05-14 DIAGNOSIS — M2550 Pain in unspecified joint: Secondary | ICD-10-CM | POA: Diagnosis not present

## 2019-05-14 DIAGNOSIS — K219 Gastro-esophageal reflux disease without esophagitis: Secondary | ICD-10-CM | POA: Diagnosis present

## 2019-05-14 DIAGNOSIS — Z7983 Long term (current) use of bisphosphonates: Secondary | ICD-10-CM

## 2019-05-14 DIAGNOSIS — Z823 Family history of stroke: Secondary | ICD-10-CM

## 2019-05-14 DIAGNOSIS — I4891 Unspecified atrial fibrillation: Secondary | ICD-10-CM | POA: Diagnosis not present

## 2019-05-14 DIAGNOSIS — I82409 Acute embolism and thrombosis of unspecified deep veins of unspecified lower extremity: Secondary | ICD-10-CM | POA: Diagnosis not present

## 2019-05-14 HISTORY — DX: Transient cerebral ischemic attack, unspecified: G45.9

## 2019-05-14 LAB — COMPREHENSIVE METABOLIC PANEL
ALT: 13 U/L (ref 0–44)
AST: 21 U/L (ref 15–41)
Albumin: 2.9 g/dL — ABNORMAL LOW (ref 3.5–5.0)
Alkaline Phosphatase: 112 U/L (ref 38–126)
Anion gap: 11 (ref 5–15)
BUN: 19 mg/dL (ref 8–23)
CO2: 27 mmol/L (ref 22–32)
Calcium: 9.2 mg/dL (ref 8.9–10.3)
Chloride: 98 mmol/L (ref 98–111)
Creatinine, Ser: 0.99 mg/dL (ref 0.44–1.00)
GFR calc Af Amer: >60 mL/min (ref >60)
GFR calc non Af Amer: 53 mL/min — ABNORMAL LOW (ref >60)
Glucose, Bld: 154 mg/dL — ABNORMAL HIGH (ref 70–99)
Potassium: 4.2 mmol/L (ref 3.5–5.1)
Sodium: 136 mmol/L (ref 135–145)
Total Bilirubin: 0.3 mg/dL (ref 0.3–1.2)
Total Protein: 5.5 g/dL — ABNORMAL LOW (ref 6.5–8.1)

## 2019-05-14 LAB — URINALYSIS, ROUTINE W REFLEX MICROSCOPIC
Bilirubin Urine: NEGATIVE
Glucose, UA: NEGATIVE mg/dL
Hgb urine dipstick: NEGATIVE
Ketones, ur: NEGATIVE mg/dL
Leukocytes,Ua: NEGATIVE
Nitrite: NEGATIVE
Protein, ur: NEGATIVE mg/dL
Specific Gravity, Urine: 1.018 (ref 1.005–1.030)
pH: 5 (ref 5.0–8.0)

## 2019-05-14 LAB — LIPID PANEL
Cholesterol: 174 mg/dL (ref 0–200)
HDL: 54 mg/dL (ref >40)
LDL Cholesterol: 107 mg/dL — ABNORMAL HIGH (ref 0–99)
Total CHOL/HDL Ratio: 3.2 RATIO
Triglycerides: 63 mg/dL (ref <150)
VLDL: 13 mg/dL (ref 0–40)

## 2019-05-14 LAB — DIFFERENTIAL
Abs Immature Granulocytes: 0.02 10*3/uL (ref 0.00–0.07)
Basophils Absolute: 0 10*3/uL (ref 0.0–0.1)
Basophils Relative: 1 %
Eosinophils Absolute: 0 10*3/uL (ref 0.0–0.5)
Eosinophils Relative: 0 %
Immature Granulocytes: 0 %
Lymphocytes Relative: 19 %
Lymphs Abs: 1.2 10*3/uL (ref 0.7–4.0)
Monocytes Absolute: 0.8 10*3/uL (ref 0.1–1.0)
Monocytes Relative: 12 %
Neutro Abs: 4.4 10*3/uL (ref 1.7–7.7)
Neutrophils Relative %: 68 %

## 2019-05-14 LAB — HEMOGLOBIN A1C
Hgb A1c MFr Bld: 5.2 % (ref 4.8–5.6)
Mean Plasma Glucose: 102.54 mg/dL

## 2019-05-14 LAB — CBC
HCT: 34.4 % — ABNORMAL LOW (ref 36.0–46.0)
Hemoglobin: 9.5 g/dL — ABNORMAL LOW (ref 12.0–15.0)
MCH: 23.7 pg — ABNORMAL LOW (ref 26.0–34.0)
MCHC: 27.6 g/dL — ABNORMAL LOW (ref 30.0–36.0)
MCV: 85.8 fL (ref 80.0–100.0)
Platelets: 340 10*3/uL (ref 150–400)
RBC: 4.01 MIL/uL (ref 3.87–5.11)
RDW: 18.2 % — ABNORMAL HIGH (ref 11.5–15.5)
WBC: 6.4 10*3/uL (ref 4.0–10.5)
nRBC: 0 % (ref 0.0–0.2)

## 2019-05-14 LAB — I-STAT CHEM 8, ED
BUN: 21 mg/dL (ref 8–23)
Calcium, Ion: 1.21 mmol/L (ref 1.15–1.40)
Chloride: 98 mmol/L (ref 98–111)
Creatinine, Ser: 1 mg/dL (ref 0.44–1.00)
Glucose, Bld: 143 mg/dL — ABNORMAL HIGH (ref 70–99)
HCT: 35 % — ABNORMAL LOW (ref 36.0–46.0)
Hemoglobin: 11.9 g/dL — ABNORMAL LOW (ref 12.0–15.0)
Potassium: 4.2 mmol/L (ref 3.5–5.1)
Sodium: 138 mmol/L (ref 135–145)
TCO2: 32 mmol/L (ref 22–32)

## 2019-05-14 LAB — RAPID URINE DRUG SCREEN, HOSP PERFORMED
Amphetamines: NOT DETECTED
Barbiturates: NOT DETECTED
Benzodiazepines: NOT DETECTED
Cocaine: NOT DETECTED
Opiates: POSITIVE — AB
Tetrahydrocannabinol: NOT DETECTED

## 2019-05-14 LAB — PROTIME-INR
INR: 1.3 — ABNORMAL HIGH (ref 0.8–1.2)
Prothrombin Time: 15.8 seconds — ABNORMAL HIGH (ref 11.4–15.2)

## 2019-05-14 LAB — ETHANOL: Alcohol, Ethyl (B): <10 mg/dL (ref <10)

## 2019-05-14 LAB — CBG MONITORING, ED: Glucose-Capillary: 162 mg/dL — ABNORMAL HIGH (ref 70–99)

## 2019-05-14 LAB — SARS CORONAVIRUS 2 BY RT PCR (HOSPITAL ORDER, PERFORMED IN ~~LOC~~ HOSPITAL LAB): SARS Coronavirus 2: NEGATIVE

## 2019-05-14 LAB — BRAIN NATRIURETIC PEPTIDE: B Natriuretic Peptide: 185.7 pg/mL — ABNORMAL HIGH (ref 0.0–100.0)

## 2019-05-14 LAB — APTT: aPTT: 32 seconds (ref 24–36)

## 2019-05-14 MED ORDER — APIXABAN 5 MG PO TABS: 10.0000 mg | ORAL_TABLET | Freq: Two times a day (BID) | ORAL | Status: DC

## 2019-05-14 MED ORDER — IOHEXOL 350 MG/ML SOLN
80.0000 mL | Freq: Once | INTRAVENOUS | Status: AC | PRN
  Administered 2019-05-14: 80 mL via INTRAVENOUS

## 2019-05-14 MED ORDER — ALENDRONATE SODIUM 70 MG PO TABS: 70.0000 mg | ORAL_TABLET | ORAL | Status: DC

## 2019-05-14 MED ORDER — HYDROCODONE-ACETAMINOPHEN 5-325 MG PO TABS
1.0000 | ORAL_TABLET | Freq: Four times a day (QID) | ORAL | Status: DC | PRN
  Administered 2019-05-15: 2 via ORAL
  Filled 2019-05-14: qty 2

## 2019-05-14 MED ORDER — POLYETHYLENE GLYCOL 3350 17 G PO PACK: 17.0000 g | PACK | Freq: Every day | ORAL | Status: DC | PRN

## 2019-05-14 MED ORDER — SENNOSIDES-DOCUSATE SODIUM 8.6-50 MG PO TABS
1.0000 | ORAL_TABLET | Freq: Every day | ORAL | Status: DC
  Administered 2019-05-15 – 2019-05-16 (×2): 1 via ORAL
  Filled 2019-05-14 (×2): qty 1

## 2019-05-14 MED ORDER — ZOLPIDEM TARTRATE 5 MG PO TABS
10.0000 mg | ORAL_TABLET | Freq: Every day | ORAL | Status: DC
  Administered 2019-05-15 (×2): 10 mg via ORAL
  Filled 2019-05-14 (×2): qty 2

## 2019-05-14 MED ORDER — LORATADINE 10 MG PO TABS
10.0000 mg | ORAL_TABLET | Freq: Every day | ORAL | Status: DC
  Administered 2019-05-15 – 2019-05-16 (×2): 10 mg via ORAL
  Filled 2019-05-14 (×2): qty 1

## 2019-05-14 MED ORDER — LEVOTHYROXINE SODIUM 25 MCG PO TABS
125.0000 ug | ORAL_TABLET | Freq: Every day | ORAL | Status: DC
  Administered 2019-05-15: 125 ug via ORAL
  Filled 2019-05-14: qty 1

## 2019-05-14 MED ORDER — ACETAMINOPHEN 325 MG PO TABS: 650.0000 mg | ORAL_TABLET | ORAL | Status: DC | PRN

## 2019-05-14 MED ORDER — ACETAMINOPHEN 650 MG RE SUPP: 650.0000 mg | RECTAL | Status: DC | PRN

## 2019-05-14 MED ORDER — STROKE: EARLY STAGES OF RECOVERY BOOK
Freq: Once | Status: AC
  Administered 2019-05-15: 12:00:00
  Filled 2019-05-14: qty 1

## 2019-05-14 MED ORDER — DOCUSATE SODIUM 100 MG PO CAPS
100.0000 mg | ORAL_CAPSULE | Freq: Two times a day (BID) | ORAL | Status: DC
  Administered 2019-05-15 – 2019-05-16 (×4): 100 mg via ORAL
  Filled 2019-05-14 (×4): qty 1

## 2019-05-14 MED ORDER — ACETAMINOPHEN 160 MG/5ML PO SOLN: 650.0000 mg | ORAL | Status: DC | PRN

## 2019-05-14 MED ORDER — FUROSEMIDE 40 MG PO TABS
40.0000 mg | ORAL_TABLET | Freq: Every day | ORAL | Status: DC
  Administered 2019-05-15 – 2019-05-16 (×2): 40 mg via ORAL
  Filled 2019-05-14 (×2): qty 1

## 2019-05-14 MED ORDER — MELATONIN 3 MG PO TABS
3.0000 mg | ORAL_TABLET | Freq: Every day | ORAL | Status: DC
  Administered 2019-05-15: 3 mg via ORAL
  Filled 2019-05-14 (×3): qty 1

## 2019-05-14 MED ORDER — ALLOPURINOL 100 MG PO TABS
300.0000 mg | ORAL_TABLET | Freq: Every day | ORAL | Status: DC
  Administered 2019-05-15 – 2019-05-16 (×2): 300 mg via ORAL
  Filled 2019-05-14: qty 1
  Filled 2019-05-14 (×2): qty 3

## 2019-05-14 MED ORDER — ACETAMINOPHEN 325 MG PO TABS: 650.0000 mg | ORAL_TABLET | Freq: Four times a day (QID) | ORAL | Status: DC | PRN

## 2019-05-14 MED ORDER — SENNOSIDES-DOCUSATE SODIUM 8.6-50 MG PO TABS: 1.0000 | ORAL_TABLET | Freq: Every evening | ORAL | Status: DC | PRN

## 2019-05-14 MED ORDER — PANTOPRAZOLE SODIUM 40 MG PO TBEC
40.0000 mg | DELAYED_RELEASE_TABLET | Freq: Two times a day (BID) | ORAL | Status: DC
  Administered 2019-05-15 – 2019-05-16 (×3): 40 mg via ORAL
  Filled 2019-05-14 (×3): qty 1

## 2019-05-14 MED ORDER — CITALOPRAM HYDROBROMIDE 10 MG PO TABS: 20.0000 mg | ORAL_TABLET | Freq: Every day | ORAL | Status: DC

## 2019-05-14 MED ORDER — FAMOTIDINE 20 MG PO TABS: 40.0000 mg | ORAL_TABLET | Freq: Every day | ORAL | Status: DC

## 2019-05-14 MED ORDER — VITAMIN B-12 1000 MCG PO TABS
1000.0000 ug | ORAL_TABLET | Freq: Every day | ORAL | Status: DC
  Administered 2019-05-15 – 2019-05-16 (×2): 1000 ug via ORAL
  Filled 2019-05-14 (×2): qty 1

## 2019-05-14 MED ORDER — FESOTERODINE FUMARATE ER 4 MG PO TB24
4.0000 mg | ORAL_TABLET | Freq: Every day | ORAL | Status: DC
  Administered 2019-05-15 – 2019-05-16 (×2): 4 mg via ORAL
  Filled 2019-05-14 (×3): qty 1

## 2019-05-14 MED ORDER — PRAVASTATIN SODIUM 10 MG PO TABS: 10.0000 mg | ORAL_TABLET | ORAL | Status: DC

## 2019-05-14 MED ORDER — CALCIUM CARBONATE ANTACID 500 MG PO CHEW
1.0000 | CHEWABLE_TABLET | Freq: Every day | ORAL | Status: DC
  Administered 2019-05-15 – 2019-05-16 (×2): 200 mg via ORAL
  Filled 2019-05-14 (×3): qty 1

## 2019-05-14 MED ORDER — CITALOPRAM HYDROBROMIDE 10 MG PO TABS
10.0000 mg | ORAL_TABLET | Freq: Every day | ORAL | Status: DC
  Administered 2019-05-15 – 2019-05-16 (×2): 10 mg via ORAL
  Filled 2019-05-14 (×2): qty 1

## 2019-05-14 MED ORDER — POLYSACCHARIDE IRON COMPLEX 150 MG PO CAPS
150.0000 mg | ORAL_CAPSULE | Freq: Every day | ORAL | Status: DC
  Filled 2019-05-14 (×2): qty 1

## 2019-05-14 MED ORDER — BACLOFEN 10 MG PO TABS
10.0000 mg | ORAL_TABLET | Freq: Three times a day (TID) | ORAL | Status: DC | PRN
  Filled 2019-05-14: qty 1

## 2019-05-14 NOTE — ED Notes (Signed)
Activated code stroke with phil from [place]

## 2019-05-14 NOTE — Consult Note (Addendum)
NEURO HOSPITALIST  CONSULT   Requesting Physician: Dr. Billy Fischer    Chief Complaint: SOB/ right sided weakness  History obtained from:  Patient  / Chart    HPI:                                                                                                                                         DAMA HEDGEPETH is an 84 y.o. female  With PMH uterine cancer, DM2, OSA ( CPAP), HTN, HLD, hypothyroidism, CHF, DVT who presented to [facility] ED from SNF with SOB. Code stroke activated in ED.  She states that she woke up this morning at 630 and was fine but was coughing and was short of breath.  After she had breakfast this morning then had right side weakness that caused her to drop her coffee. at 8:00 patient has a recent right  hip fracture (04/08/2019). Went to SNF after hip fracture. Had a pneumonia and finished omnicef 8/10. No prior history of stroke.   ED course: Patient right hand weakness appears to have improved near totally and she had minimum fine finger movement weakness in the right hand and right facial droop. BP: 136/59 BG:143 CTH: no hemorrhage  Date last known well: Date: 05/14/2019 Time last known well: Unable to determine tPA Given: No: outside of window, low NIHSS Modified Rankin: Rankin Score=2 NIHSS:1; right facial droop Patient denies any prior history of strokes, TIAs, seizures or significant neurological problems.   Past Medical History:  Diagnosis Date  . Arthritis    "shoulders, knees, lower back" (07/14/2017)  . CHF (congestive heart failure) ([place])    "while in hospital w/hysterectomy in 2017"  . Chronic lower back pain   . Depression   . Diverticulitis of large intestine with abscess 07/13/2017  . DVT (deep venous thrombosis) ([place])   . Dyspnea    "cause I'm over weight"  . Esophageal reflux   . Gout   . Hyperlipidemia    under control  . Hypertension   . Hypothyroidism   . Morbid obesity with BMI of 40.0-44.9,  adult ([place])   . Nocturia   . OSA on CPAP    "not wearing it when I sleep in my lift chair" (07/14/2017)  . Osteoarthritis   . Phlebitis    hx of  . Pneumonia ?1989  . Spondylosis    with scoliosis  . Type II diabetes mellitus (HCC)    borderline , diet controlled   . Urinary frequency   . Uterine cancer (HCC)    S/P chemo, radiation, hysterectomy    Past Surgical History:  Procedure Laterality Date  .  APPENDECTOMY    . CATARACT EXTRACTION, BILATERAL Bilateral 2018  . COLONOSCOPY    . DILATATION & CURETTAGE/HYSTEROSCOPY WITH MYOSURE N/A 03/04/2016   Procedure: DILATATION & CURETTAGE/HYSTEROSCOPY ;  Surgeon: Azucena Fallen, MD;  Location: [facility];  Service: Gynecology;  Laterality: N/A;  polyp removal   . ESOPHAGOGASTRODUODENOSCOPY (EGD) WITH PROPOFOL N/A 11/14/2018   Procedure: ESOPHAGOGASTRODUODENOSCOPY (EGD) WITH PROPOFOL;  Surgeon: Wonda Horner, MD;  Location: [facility];  Service: Endoscopy;  Laterality: N/A;  . FEMUR IM NAIL Right 04/09/2019   Procedure: INTRAMEDULLARY (IM) NAIL INTERTROCH;  Surgeon: Renette Butters, MD;  Location: [facility];  Service: Orthopedics;  Laterality: Right;  . HERNIA REPAIR    . IR GENERIC HISTORICAL  05/15/2016   IR US GUIDE VASC ACCESS RIGHT 05/15/2016 Aletta Edouard, MD [facility]  . IR GENERIC HISTORICAL  05/15/2016   IR FLUORO GUIDE CV LINE RIGHT 05/15/2016 Aletta Edouard, MD [facility]  . IR REMOVAL TUN ACCESS W/ PORT W/O FL MOD SED  04/19/2018  . LAPAROSCOPIC CHOLECYSTECTOMY  2004  . LAPAROSCOPIC INCISIONAL / UMBILICAL / VENTRAL HERNIA REPAIR  2004   "w/gallbladder OR"  . LYMPH NODE BIOPSY N/A 03/26/2016   Procedure: [place] LYMPH NODE BIOPSY;  Surgeon: Everitt Amber, MD;  Location: [facility];  Service: Gynecology;  Laterality: N/A;  . REVERSE SHOULDER ARTHROPLASTY Right 10/26/2018   Procedure: REVERSE SHOULDER ARTHROPLASTY;  Surgeon: Nicholes Stairs, MD;  Location: [place];  Service: Orthopedics;  Laterality: Right;  . ROBOTIC ASSISTED TOTAL  HYSTERECTOMY WITH BILATERAL SALPINGO OOPHERECTOMY N/A 03/26/2016   Procedure: XI ROBOTIC ASSISTED TOTAL Laproscopic HYSTERECTOMY WITH BILATERAL SALPINGO OOPHORECTOMY;  Surgeon: Everitt Amber, MD;  Location: [facility];  Service: Gynecology;  Laterality: N/A;  . TOTAL KNEE ARTHROPLASTY Left 10/09/2013   Procedure: LEFT TOTAL KNEE ARTHROPLASTY;  Surgeon: [name], MD;  Location: [facility];  Service: Orthopedics;  Laterality: Left;  . TOTAL KNEE ARTHROPLASTY Right 04/09/2014   Procedure: RIGHT TOTAL KNEE ARTHROPLASTY;  Surgeon: [name], MD;  Location: [facility];  Service: Orthopedics;  Laterality: Right;  . TUBAL LIGATION  1982  . VARICOSE VEIN SURGERY Bilateral 1968  . WISDOM TOOTH EXTRACTION  1963    Family History  Problem Relation Age of Onset  . Heart disease Mother   . Heart disease Father   . CVA Father          Social History:  reports that she has never smoked. She has never used smokeless tobacco. She reports that she does not drink alcohol or use drugs.  Allergies:  Allergies  Allergen Reactions  . Oxybutynin Other (See Comments)    Bleeding gums, felt sick    Medications:                                                                                                                           No current facility-administered medications for this encounter.    Current Outpatient Medications  Medication Sig Dispense Refill  .  acetaminophen (TYLENOL) 325 MG tablet Take 2 tablets (650 mg total) by mouth every 6 (six) hours as needed for mild pain or moderate pain (pain score 1-3 or temp > 100.5). 40 tablet 0  . alendronate (FOSAMAX) 70 MG tablet Take 70 mg by mouth every Friday. Take with a full glass of water on an empty stomach.     Marland Kitchen allopurinol (ZYLOPRIM) 300 MG tablet Take 300 mg by mouth daily.     . baclofen (LIORESAL) 10 MG tablet Take 1 tablet (10 mg total) by mouth 3 (three) times daily as needed for muscle spasms. 20 tablet 0  . calcium gluconate 500 MG tablet Take 1  tablet by mouth daily.     . cetirizine (ZYRTEC) 10 MG tablet Take 10 mg by mouth daily.    . citalopram (CELEXA) 20 MG tablet Take 20 mg by mouth daily.     Marland Kitchen docusate sodium (COLACE) 100 MG capsule Take 1 capsule (100 mg total) by mouth 2 (two) times daily. 10 capsule 0  . enoxaparin (LOVENOX) 40 MG/0.4ML injection Inject 0.4 mLs (40 mg total) into the skin daily for 30 doses. For 30 days post op for DVT prophylaxis 12 mL 0  . furosemide (LASIX) 20 MG tablet Take 20 mg by mouth daily.    Marland Kitchen HYDROcodone-acetaminophen (NORCO) 5-325 MG tablet Take 1-2 tablets by mouth every 6 (six) hours as needed for severe pain (Use tylenol for mild and moderate pain). 30 tablet 0  . hydrOXYzine (ATARAX/VISTARIL) 25 MG tablet Take 25 mg by mouth at bedtime.    Marland Kitchen levothyroxine (SYNTHROID, LEVOTHROID) 125 MCG tablet Take 125 mcg by mouth daily at 6 (six) AM.     . Melatonin 3 MG TABS Take 3 mg by mouth at bedtime.     Marland Kitchen nystatin (NYSTATIN) powder Apply topically See admin instructions. Apply topically to abdominal skin folds and under bilateral breast for intertrigo - twice daily - please make sure skin has been cleaned and is dry before applying    . Omega-3 Fatty Acids (FISH OIL) 1000 MG CAPS Take 1,000 mg by mouth daily.     . pantoprazole (PROTONIX) 40 MG tablet Take 1 tablet (40 mg total) by mouth 2 (two) times daily before a meal. 60 tablet 1  . polyethylene glycol (MIRALAX / GLYCOLAX) packet Take 17 g by mouth daily as needed for mild constipation or moderate constipation. (Patient taking differently: Take 17 g by mouth daily. ) 14 each 0  . pravastatin (PRAVACHOL) 10 MG tablet Take 10 mg by mouth every Thursday. At bedtime    . pyridOXINE (VITAMIN B-6) 100 MG tablet Take 100 mg by mouth daily.    Marland Kitchen tolterodine (DETROL LA) 4 MG 24 hr capsule Take 4 mg by mouth every evening.     . vitamin B-12 (CYANOCOBALAMIN) 100 MCG tablet Take 100 mcg by mouth daily.     . vitamin C (ASCORBIC ACID) 500 MG tablet Take 500 mg  by mouth daily.    Marland Kitchen zolpidem (AMBIEN) 10 MG tablet Take 10 mg by mouth at bedtime.       ROS:  ROS was performed and is negative except as noted in HPI    General Examination:                                                                                                      Blood pressure (!) 136/59, pulse 75, temperature 98.5 F (36.9 C), temperature source Oral, resp. rate (!) 22, height 5' (1.524 m), weight 108.9 kg, SpO2 98 %.  HEENT-  Normocephalic, no lesions, without obvious abnormality.  Normal external eye and conjunctiva. Cardiovascular- S1-S2 audible, pulses palpable throughout  Lungs-  Saturations within normal limits on 3 L [place]  Abdomen- All 4 quadrants palpated and non tender Extremities- Warm, dry and intact Musculoskeletal-no joint tenderness, deformity or swelling Skin-warm and dry, no hyperpigmentation, vitiligo, or suspicious lesions  Neurological Examination  She is awake alert oriented to time place and person.  She follows commands well.  Speech is clear without dysarthria or aphasia.  Extraocular movements are full range without nystagmus.  Visual fields seem full to bedside confrontational testing.  She has mild right low nasolabial fold asymmetry.  Tongue midline.  Motor system exam reveals no upper extremity drift.  Right lower extremity exam is limited due to recent hip fracture and pain.  She has good strength proximally in the upper extremities.  She has good grip strength on the right.  Fine finger movements are diminished on the right.  She orbits left over right upper extremity.  She has good distal strength in right lower extremity.  Normal strength on the left.  Sensation is symmetric bilaterally.  Deep tendon reflexes are symmetric.  Plantars are downgoing.  Gait not tested.  NIH stroke scale 1    Lab Results: Basic  Metabolic Panel: Recent Labs  Lab 05/14/19 1132  NA 138  K 4.2  CL 98  GLUCOSE 143*  BUN 21  CREATININE 1.00    CBC: Recent Labs  Lab 05/14/19 1132  HGB 11.9*  HCT 35.0*    CBG: Recent Labs  Lab 05/14/19 1034  GLUCAP 162*    Imaging: Ct Head Code Stroke Wo Contrast  Result Date: 05/14/2019 CLINICAL DATA:  Code stroke.  Right facial droop EXAM: CT HEAD WITHOUT CONTRAST TECHNIQUE: Contiguous axial images were obtained from the base of the skull through the vertex without intravenous contrast. COMPARISON:  10/19/2018 head CT report, images not available FINDINGS: Brain: Small area of cortex blurring in the posterior left frontal lobe. Mild chronic small vessel ischemic change in the cerebral white matter. No hemorrhage, hydrocephalus, or collection. Mild for age volume loss. Vascular: No hyperdense vessel. Skull: Negative Sinuses/Orbits: Negative Other: These results were communicated to Dr. Leonie Man at 11:50 amon 8/16/2020by text page via the [facility] messaging system. ASPECTS [facility] Stroke Program Early CT Score) - Ganglionic level infarction (caudate, lentiform nuclei, internal capsule, insula, M1-M3 cortex): 7 - Supraganglionic infarction (M4-M6 cortex): 2 Total score (0-10 with 10 being normal): 9 IMPRESSION: 1. Suspect small left posterior frontal cortex infarct. ASPECTS is 9. 2. No acute hemorrhage. Electronically Signed   By: Neva Seat.D.  On: 05/14/2019 11:52       Laurey Morale, MSN, NP-C [facility] [phone]  05/14/2019, 11:35 AM   Attending physician note to follow with Assessment and plan .   Assessment: Meloni L Bula is an 84 y.o. female  With PMH uterine cancer, DM2, OSA ( CPAP), HTN, HLD, hypothyroidism, CHF, DVT who presented to [facility] ED from SNF with right upper extremity weakness following cough and shortness of breath which appears to be improving.. Code stroke activated in ED. TPA not given d/t presenting outside of the window and  very low NIH stroke scale and rapidly improving deficits. Not IR candidate. CTH: no hemorrhage or hyperdense vessels.  Event most likely worsening of previous deficits in the setting of low oxygenation. Recommend TIA work up  Stroke Risk Factors - diabetes mellitus, hyperlipidemia and hypertension    Recommendations: -- BP goal : Permissive HTN upto 220/110 mmHg --MRI Brain when stable from respiratory standpoint to withstand MRI -- lipids, hgbA1c,, echocardiogram and telemetry monitoring Aspirin and Plavix for stroke prevention for 3 weeks followed by aspirin Check LE venous dopplers for DVT given p/h/o dvt and recent hip surgery to r/o paradoxical embolism I have personally obtained history,examined this patient, reviewed notes, independently viewed imaging studies, participated in medical decision making and plan of care.ROS completed by me personally and pertinent positives fully documented  I have made any additions or clarifications directly to the above note. Agree with note above.  She presented with right upper extremity weakness following shortness of breath and bout of coughing this morning and appears to be improving.  She possibly had a small left hemispheric infarct versus TIA.  She will need admission for ongoing stroke work-up.  Continue stroke work-up as listed above.  Dual antiplatelet therapy for 3 weeks followed by aspirin.  Long discussion with patient and with Dr. Darcey Nora ER MD and answered questions.  Greater than 50% time during this 60-minute consultation visit was spent on counseling and coordination of care about TIA and stroke and discussion about evaluation prevention and treatment and answering questions  Antony Contras, MD Medical Director [facility] Pager: [phone] 05/14/2019 1:14 PM    --please page stroke NP  Or  PA  Or MD from 8am -4 pm  as this patient from this time will be  followed by the stroke.   You can look them up on [email or web address]   Password [email or web address]

## 2019-05-14 NOTE — ED Notes (Signed)
[name]  Niece- [phone] updated on status and admission.

## 2019-05-14 NOTE — ED Triage Notes (Signed)
From SNF following increased dyspnea this AM with sats 88%  Had breakfast then had R side weakness, dropping her coffee.  On arrival pt has upper airway expiatory wheezing with c/o SOB.   Equal grip strength anad alert and oriented x 4.  Pt had hip fx and went to this center on 04-08-2019 and had bout of pneumonia finishing omnicef on 05-08-2019.  Plan to finish PT and possible placement in ALF.  Cough starting this AM after breathing tx.   Denies any urinary changes, CP or abd pain.

## 2019-05-14 NOTE — H&P (Addendum)
[facility]                                                                                    Patient Demographics  Melinda Kerr, is a 84 y.o. female  CSN: [id number]  MRN: [id number]  DOB - [birth year]  Admit Date - 05/14/2019  Outpatient Primary MD for the patient is Maury Dus, MD   With History of -  Past Medical History:  Diagnosis Date  . Arthritis    "shoulders, knees, lower back" (07/14/2017)  . CHF (congestive heart failure) ([place])    "while in hospital w/hysterectomy in 2017"  . Chronic lower back pain   . Depression   . Diverticulitis of large intestine with abscess 07/13/2017  . DVT (deep venous thrombosis) ([place])   . Dyspnea    "cause I'm over weight"  . Esophageal reflux   . Gout   . Hyperlipidemia    under control  . Hypertension   . Hypothyroidism   . Morbid obesity with BMI of 40.0-44.9, adult ([place])   . Nocturia   . OSA on CPAP    "not wearing it when I sleep in my lift chair" (07/14/2017)  . Osteoarthritis   . Phlebitis    hx of  . Pneumonia ?1989  . Spondylosis    with scoliosis  . Type II diabetes mellitus (HCC)    borderline , diet controlled   . Urinary frequency   . Uterine cancer (HCC)    S/P chemo, radiation, hysterectomy      Past Surgical History:  Procedure Laterality Date  . APPENDECTOMY    . CATARACT EXTRACTION, BILATERAL Bilateral 2018  . COLONOSCOPY    . DILATATION & CURETTAGE/HYSTEROSCOPY WITH MYOSURE N/A 03/04/2016   Procedure: DILATATION & CURETTAGE/HYSTEROSCOPY ;  Surgeon: Azucena Fallen, MD;  Location: [facility];  Service: Gynecology;  Laterality: N/A;  polyp removal   . ESOPHAGOGASTRODUODENOSCOPY (EGD) WITH PROPOFOL N/A 11/14/2018   Procedure: ESOPHAGOGASTRODUODENOSCOPY (EGD) WITH PROPOFOL;  Surgeon: Wonda Horner, MD;  Location: [facility];  Service: Endoscopy;  Laterality: N/A;  . FEMUR IM NAIL Right 04/09/2019   Procedure: INTRAMEDULLARY (IM) NAIL INTERTROCH;  Surgeon: Renette Butters, MD;  Location:  [facility];  Service: Orthopedics;  Laterality: Right;  . HERNIA REPAIR    . IR GENERIC HISTORICAL  05/15/2016   IR US GUIDE VASC ACCESS RIGHT 05/15/2016 Aletta Edouard, MD [facility]  . IR GENERIC HISTORICAL  05/15/2016   IR FLUORO GUIDE CV LINE RIGHT 05/15/2016 Aletta Edouard, MD [facility]  . IR REMOVAL TUN ACCESS W/ PORT W/O FL MOD SED  04/19/2018  . LAPAROSCOPIC CHOLECYSTECTOMY  2004  . LAPAROSCOPIC INCISIONAL / UMBILICAL / VENTRAL HERNIA REPAIR  2004   "w/gallbladder OR"  . LYMPH NODE BIOPSY N/A 03/26/2016   Procedure: Sent[place] LYMPH NODE BIOPSY;  Surgeon: Everitt Amber, MD;  Location: [facility];  Service: Gynecology;  Laterality: N/A;  . REVERSE SHOULDER ARTHROPLASTY Right 10/26/2018   Procedure: REVERSE SHOULDER ARTHROPLASTY;  Surgeon: Nicholes Stairs, MD;  Location: [facility];  Service: Orthopedics;  Laterality: Right;  . ROBOTIC ASSISTED TOTAL HYSTERECTOMY WITH BILATERAL SALPINGO OOPHERECTOMY N/A 03/26/2016   Procedure: XI ROBOTIC  ASSISTED TOTAL Laproscopic HYSTERECTOMY WITH BILATERAL SALPINGO OOPHORECTOMY;  Surgeon: Everitt Amber, MD;  Location: [facility];  Service: Gynecology;  Laterality: N/A;  . TOTAL KNEE ARTHROPLASTY Left 10/09/2013   Procedure: LEFT TOTAL KNEE ARTHROPLASTY;  Surgeon: [name], MD;  Location: [facility];  Service: Orthopedics;  Laterality: Left;  . TOTAL KNEE ARTHROPLASTY Right 04/09/2014   Procedure: RIGHT TOTAL KNEE ARTHROPLASTY;  Surgeon: [name], MD;  Location: [facility];  Service: Orthopedics;  Laterality: Right;  . TUBAL LIGATION  1982  . VARICOSE VEIN SURGERY Bilateral 1968  . [place]    in for   Chief Complaint  Patient presents with  . Code Stroke     HPI  Melinda Kerr  is a 84 y.o. female, SNF resident with past medical history significant for uterine cancer, OSA, congestive heart failure, DVT, morbid obesity, hypertension and hyperlipidemia who presented originally with shortness of breath. She also reported right-sided  weakness, sudden that started this a.m.  Her right hand became so weak and she dropped her coffee.  Patient had a recent hip fracture on 04/08/2019, repaired , ER work-up was negative for acute pulmonary embolism or edema by CT of chest, however CT of the head was concerning for stroke.  Physical examination showed normal motor power and neurology was consulted.    Review of Systems    In addition to the HPI above,  No Fever-chills, No Headache, No changes with Vision or hearing, No problems swallowing food or Liquids, No Chest pain,  No Abdominal pain, No Nausea or Vommitting, Bowel movements are regular, No Blood in stool or Urine, No dysuria, No new skin rashes or bruises, No new joints pains-aches,  No recent weight gain or loss, No polyuria, polydypsia or polyphagia, No significant Mental Stressors.  All systems were reviewed and were negative.   Social History Social History   Tobacco Use  . Smoking status: Never Smoker  . Smokeless tobacco: Never Used  Substance Use Topics  . Alcohol use: No     Family History Family History  Problem Relation Age of Onset  . Heart disease Mother   . Heart disease Father   . CVA Father      Prior to Admission medications   Medication Sig Start Date End Date Taking? Authorizing Provider  acetaminophen (TYLENOL) 325 MG tablet Take 2 tablets (650 mg total) by mouth every 6 (six) hours as needed for mild pain or moderate pain (pain score 1-3 or temp > 100.5). 04/13/19  Yes Dana Allan I, MD  allopurinol (ZYLOPRIM) 300 MG tablet Take 300 mg by mouth daily.    Yes [provider]  apixaban (ELIQUIS) 5 MG TABS tablet Take 10 mg by mouth 2 (two) times daily. Shortness of breath   Yes [provider]  calcium carbonate (TUMS - DOSED IN MG ELEMENTAL CALCIUM) 500 MG chewable tablet Chew 1 tablet by mouth daily.   Yes [provider]  cetirizine (ZYRTEC) 10 MG tablet Take 10 mg by mouth daily.   Yes [provider]  citalopram (CELEXA) 10 MG tablet Take 10 mg by mouth daily. 03/20/19  Yes [provider]  citalopram (CELEXA) 20 MG tablet Take 20 mg by mouth daily.  01/10/15  Yes [provider]  furosemide (LASIX) 20 MG tablet Take 40 mg by mouth daily.  01/10/15  Yes [provider]  hydrOXYzine (ATARAX/VISTARIL) 25 MG tablet Take 25 mg by mouth at bedtime.   Yes  [provider]  iron polysaccharides (NIFEREX) 150 MG capsule Take 150 mg by mouth daily.   Yes [provider]  levothyroxine (SYNTHROID, LEVOTHROID) 125 MCG tablet Take 125 mcg by mouth daily at 6 (six) AM.    Yes [provider]  Melatonin 3 MG TABS Take 3 mg by mouth at bedtime.    Yes [provider]  Omega-3 Fatty Acids (FISH OIL) 1000 MG CAPS Take 1,000 mg by mouth daily.    Yes [provider]  pravastatin (PRAVACHOL) 10 MG tablet Take 10 mg by mouth every Thursday. At bedtime 02/14/18  Yes [provider]  senna-docusate (SENOKOT-S) 8.6-50 MG tablet Take 1 tablet by mouth daily.   Yes [provider]  tolterodine (DETROL LA) 4 MG 24 hr capsule Take 4 mg by mouth every evening.    Yes [provider]  vitamin B-12 (CYANOCOBALAMIN) 1000 MCG tablet Take 1,000 mcg by mouth daily.    Yes [provider]  vitamin C (ASCORBIC ACID) 500 MG tablet Take 500 mg by mouth daily.   Yes [provider]  Vitamin D, Ergocalciferol, (DRISDOL) 1.25 MG (50000 UT) CAPS capsule Take 50,000 Units by mouth every 7 (seven) days. Monday   Yes [provider]  zolpidem (AMBIEN) 10 MG tablet Take 10 mg by mouth at bedtime. 02/17/19  Yes [provider]  alendronate (FOSAMAX) 70 MG tablet Take 70 mg by mouth every Friday. Take with a full glass of water on an empty stomach.     [provider]  baclofen (LIORESAL) 10 MG tablet Take 1 tablet (10 mg total) by mouth 3 (three) times daily as needed for muscle spasms.  04/12/19   Swayze, Ava, DO  calcium gluconate 500 MG tablet Take 1 tablet by mouth daily.     [provider]  docusate sodium (COLACE) 100 MG capsule Take 1 capsule (100 mg total) by mouth 2 (two) times daily. 04/12/19   Swayze, Ava, DO  enoxaparin (LOVENOX) 40 MG/0.4ML injection Inject 0.4 mLs (40 mg total) into the skin daily for 30 doses. For 30 days post op for DVT prophylaxis 04/09/19 05/09/19  Prudencio Burly III, PA-C  HYDROcodone-acetaminophen (NORCO) 5-325 MG tablet Take 1-2 tablets by mouth every 6 (six) hours as needed for severe pain (Use tylenol for mild and moderate pain). 04/09/19   Prudencio Burly III, PA-C  nystatin (NYSTATIN) powder Apply topically See admin instructions. Apply topically to abdominal skin folds and under bilateral breast for intertrigo - twice daily - please make sure skin has been cleaned and is dry before applying    [provider]  pantoprazole (PROTONIX) 40 MG tablet Take 1 tablet (40 mg total) by mouth 2 (two) times daily before a meal. 11/17/18   Gherghe, Vella Redhead, MD  polyethylene glycol (MIRALAX / GLYCOLAX) packet Take 17 g by mouth daily as needed for mild constipation or moderate constipation. Patient taking differently: Take 17 g by mouth daily.  04/12/14   Perkins, Alexzandrew L, PA-C  pyridOXINE (VITAMIN B-6) 100 MG tablet Take 100 mg by mouth daily.    [provider]    Allergies  Allergen Reactions  . Oxybutynin Other (See Comments)    Bleeding gums, felt sick    Physical Exam  Vitals  Blood pressure 113/64, pulse 68, temperature 98.5 F (36.9 C), temperature source Oral, resp. rate (!) 21, height 5' (1.524 m), weight 108.9 kg, SpO2 97 %.   General appearance, looks chronically ill,  obese, very pleasant HEENT no jaundice or pallor, mild right facial droop ,oral thrush Neck supple, no neck vein distention Chest good air entry bilaterally with decreased breath sounds at the bases Heart normal S1-S2  murmurs gallops rubs Abdomen soft, obese, nontender Extremities no clubbing cyanosis +1 edema in lower extremities Neuro other than the facial droop it was basically negative with normal motor power in upper extremities.  Lower extremities were examined and was limited on the right because of the hip fracture otherwise symmetrical.  Her mentation was normal Gait not tested Skin no rashes or ulcers Psych Pleasant no homicidal or suicidal ideations  Data Review  CBC Recent Labs  Lab 05/14/19 1130 05/14/19 1132  WBC 6.4  --   HGB 9.5* 11.9*  HCT 34.4* 35.0*  PLT 340  --   MCV 85.8  --   MCH 23.7*  --   MCHC 27.6*  --   RDW 18.2*  --   LYMPHSABS 1.2  --   MONOABS 0.8  --   EOSABS 0.0  --   BASOSABS 0.0  --    ------------------------------------------------------------------------------------------------------------------  Chemistries  Recent Labs  Lab 05/14/19 1130 05/14/19 1132  NA 136 138  K 4.2 4.2  CL 98 98  CO2 27  --   GLUCOSE 154* 143*  BUN 19 21  CREATININE 0.99 1.00  CALCIUM 9.2  --   AST 21  --   ALT 13  --   ALKPHOS 112  --   BILITOT 0.3  --    ------------------------------------------------------------------------------------------------------------------ estimated creatinine clearance is 48.5 mL/min (by C-G formula based on SCr of 1 mg/dL). ------------------------------------------------------------------------------------------------------------------ No results for input(s): TSH, T4TOTAL, T3FREE, THYROIDAB in the last 72 hours.  Invalid input(s): FREET3   Coagulation profile Recent Labs  Lab 05/14/19 1130  INR 1.3*   ------------------------------------------------------------------------------------------------------------------- No results for input(s): DDIMER in the last 72 hours. -------------------------------------------------------------------------------------------------------------------  Cardiac Enzymes No results for  input(s): CKMB, TROPONINI, MYOGLOBIN in the last 168 hours.  Invalid input(s): CK ------------------------------------------------------------------------------------------------------------------ Invalid input(s): POCBNP   ---------------------------------------------------------------------------------------------------------------  Urinalysis    Component Value Date/Time   COLORURINE COLORLESS (A) 05/14/2019 1331   APPEARANCEUR CLEAR 05/14/2019 1331   LABSPEC 1.018 05/14/2019 1331   LABSPEC 1.020 05/04/2016 1006   PHURINE 5.0 05/14/2019 1331   GLUCOSEU NEGATIVE 05/14/2019 1331   GLUCOSEU Negative 05/04/2016 1006   HGBUR NEGATIVE 05/14/2019 1331   BILIRUBINUR NEGATIVE 05/14/2019 1331   BILIRUBINUR Negative 05/04/2016 1006   KETONESUR NEGATIVE 05/14/2019 1331   PROTEINUR NEGATIVE 05/14/2019 1331   UROBILINOGEN 0.2 05/04/2016 1006   NITRITE NEGATIVE 05/14/2019 1331   LEUKOCYTESUR NEGATIVE 05/14/2019 1331   LEUKOCYTESUR Small 05/04/2016 1006    ----------------------------------------------------------------------------------------------------------------   Imaging results:   Ct Angio Chest Pe W And/or Wo Contrast  Result Date: 05/14/2019 CLINICAL DATA:  Short of breath.  Hip replacement. EXAM: CT ANGIOGRAPHY CHEST WITH CONTRAST TECHNIQUE: Multidetector CT imaging of the chest was performed using the standard protocol during bolus administration of intravenous contrast. Multiplanar CT image reconstructions and MIPs were obtained to evaluate the vascular anatomy. CONTRAST:  77mL OMNIPAQUE IOHEXOL 350 MG/ML SOLN COMPARISON:  CT 10/27/2018 FINDINGS: Cardiovascular: No filling defects within the pulmonary arteries to suggest acute pulmonary embolism. No acute findings of the aorta or great vessels. No pericardial fluid. Atelectasis in the medial RIGHT lower lobe associated large hiatal hernia which does limit evaluation of the segment. Mediastinum/Nodes: No axillary or supraclavicular  adenopathy. No mediastinal hilar adenopathy. Lungs/Pleura: No pulmonary infarction. Atelectasis in the  medial RIGHT lower lobe associated with the large hiatal hernia. Upper Abdomen: Large hiatal hernia extending in the medial RIGHT hemithorax. Musculoskeletal: No aggressive osseous lesion. Review of the MIP images confirms the above findings. IMPRESSION: 1. No evidence acute pulmonary embolism. 2. No acute pulmonary parenchymal findings. 3. Large hiatal hernia with associated RIGHT lower lobe atelectasis. Electronically Signed   By: Suzy Bouchard M.D.   On: 05/14/2019 12:38   Ct Head Code Stroke Wo Contrast  Result Date: 05/14/2019 CLINICAL DATA:  Code stroke.  Right facial droop EXAM: CT HEAD WITHOUT CONTRAST TECHNIQUE: Contiguous axial images were obtained from the base of the skull through the vertex without intravenous contrast. COMPARISON:  10/19/2018 head CT report, images not available FINDINGS: Brain: Small area of cortex blurring in the posterior left frontal lobe. Mild chronic small vessel ischemic change in the cerebral white matter. No hemorrhage, hydrocephalus, or collection. Mild for age volume loss. Vascular: No hyperdense vessel. Skull: Negative Sinuses/Orbits: Negative Other: These results were communicated to Dr. Leonie Man at 11:50 amon 8/16/2020by text page via the [facility] messaging system. ASPECTS [facility] Stroke Program Early CT Score) - Ganglionic level infarction (caudate, lentiform nuclei, internal capsule, insula, M1-M3 cortex): 7 - Supraganglionic infarction (M4-M6 cortex): 2 Total score (0-10 with 10 being normal): 9 IMPRESSION: 1. Suspect small left posterior frontal cortex infarct. ASPECTS is 9. 2. No acute hemorrhage. Electronically Signed   By: Monte Fantasia M.D.   On: 05/14/2019 11:52    My personal review of EKG: Rhythm NSR, at 72 bpm, no acute changes noted  Assessment & Plan  CVA/TIA Admit to telemetry Only physical abnormality was noted with facial droop CT of the  head concerning for small left posterior frontal cortex infarct Check MRI/MRA Neurology on consult  Subjective shortness of breath CT of chest was negative Patient saturating well Check lower extremity Dopplers and monitor telemetry  Hypertension Controlled on no medications  OSA Monitor oxygenation  Hypothyroidism Continue Synthroid  Hypercholesterolemia Continue with Pravachol  Anxiety/depression Continue Celexa  DVT Prophylaxis Eliquis  AM Labs Ordered, also please review Full Orders  Family Communication: Discussed with [name] her niece  Code Status full  Disposition Plan: Back to SNF  Time spent in minutes : 42 minutes  Condition GUARDED   @SIGNATURE @

## 2019-05-14 NOTE — ED Notes (Signed)
To MRI.  Talkative with clear speech.   Denies any pain, requesting food and fluids at this time.

## 2019-05-14 NOTE — ED Provider Notes (Signed)
[facility] EMERGENCY DEPARTMENT Provider Note   CSN: [id number] Arrival date & time: 05/14/19  1017  An emergency department physician performed an initial assessment on this suspected stroke patient at 1120.  History   Chief Complaint Chief Complaint  Patient presents with   Code Stroke    HPI Morgann L Djordjevic is a 84 y.o. female.     HPI  84yo female with history of CHF, remote DVT not on anticoagulation, hyperlipidemia, hypertension, DM, uterine cancer, recent hip fx in rehab, presents with concern for shortness of breath and episode of right sided arm weakness. Reports waking this AM with coughing and dyspnea.  Reports she feels better on O2.  At [place], suddenly ahd difficulty holding her coffee cup, kept dropping it. Lasted 30 minutes. Reports now she is improved, no numbness/weakness. Denies difficulty speaking, visual changes. Reports due to hip surgery having difficulty walking but no other acute changes.  NO fever, no headache, no n/v.   Past Medical History:  Diagnosis Date   Arthritis    "shoulders, knees, lower back" (07/14/2017)   CHF (congestive heart failure) ([place])    "while in hospital w/hysterectomy in 2017"   Chronic lower back pain    Depression    Diverticulitis of large intestine with abscess 07/13/2017   DVT (deep venous thrombosis) (HCC)    Dyspnea    "cause I'm over weight"   Esophageal reflux    Gout    Hyperlipidemia    under control   Hypertension    Hypothyroidism    Morbid obesity with BMI of 40.0-44.9, adult ([place])    Nocturia    OSA on CPAP    "not wearing it when I sleep in my lift chair" (07/14/2017)   Osteoarthritis    Phlebitis    hx of   Pneumonia ?1989   Spondylosis    with scoliosis   Type II diabetes mellitus ([place])    borderline , diet controlled    Urinary frequency    Uterine cancer ([place])    S/P chemo, radiation, hysterectomy    Patient Active Problem List   Diagnosis Date Noted     TIA (transient ischemic attack) 05/14/2019   Displaced comminuted fracture of shaft of right femur, initial encounter for closed fracture ([place]) 04/08/2019   GIB (gastrointestinal bleeding) 11/13/2018   Anemia associated with acute blood loss 11/13/2018   Closed fracture of right proximal humerus 10/26/2018   S/P reverse total shoulder arthroplasty, right 10/26/2018   Acute appendicitis    Diverticulitis of large intestine with abscess 07/13/2017   Colitis 07/13/2017   Aortic atherosclerosis ([place]) 09/12/2016   Coronary artery calcification 09/12/2016   Hiatal hernia 09/12/2016   Lumbar disc disease 09/12/2016   Chemotherapy-induced thrombocytopenia 08/04/2016   Chemotherapy induced neutropenia ([place]) 05/26/2016   Port catheter in place 05/26/2016   Encounter for antineoplastic chemotherapy 05/26/2016   Urinary frequency 04/24/2016   Obstructive sleep apnea on CPAP 04/24/2016   History of DVT (deep vein thrombosis) 04/24/2016   Endometrial cancer, FIGO stage IIIC ([place]) 04/24/2016   Malignant neoplasm of endometrium ([place]) 03/26/2016   Dyspnea on exertion 03/19/2016   Malignant neoplasm of uterus ([place]) 03/19/2016   Status post right knee replacement 04/16/2014   Essential hypertension 04/16/2014   Postoperative anemia due to acute blood loss 04/12/2014   Status post total left knee replacement 10/14/2013   Hypothyroidism 10/14/2013   UI (urinary incontinence) 10/14/2013   Dyslipidemia 10/14/2013   Constipation 10/14/2013   Gout 10/14/2013  Anemia 10/14/2013   Allergic rhinitis 10/14/2013   Insomnia 10/14/2013   Depression 10/14/2013   OA (osteoarthritis) of knee 10/09/2013   OSA on CPAP 08/10/2013   Morbid obesity with BMI of 45.0-49.9, adult [facility])     Past Surgical History:  Procedure Laterality Date   APPENDECTOMY     CATARACT EXTRACTION, BILATERAL Bilateral 2018   COLONOSCOPY     DILATATION & CURETTAGE/HYSTEROSCOPY WITH  MYOSURE N/A 03/04/2016   Procedure: DILATATION & CURETTAGE/HYSTEROSCOPY ;  Surgeon: Azucena Fallen, MD;  Location: [facility];  Service: Gynecology;  Laterality: N/A;  polyp removal    ESOPHAGOGASTRODUODENOSCOPY (EGD) WITH PROPOFOL N/A 11/14/2018   Procedure: ESOPHAGOGASTRODUODENOSCOPY (EGD) WITH PROPOFOL;  Surgeon: Wonda Horner, MD;  Location: [facility] END[facility]COPY;  Service: Endoscopy;  Laterality: N/A;   FEMUR IM NAIL Right 04/09/2019   Procedure: INTRAMEDULLARY (IM) NAIL INTERTROCH;  Surgeon: Renette Butters, MD;  Location: [facility];  Service: Orthopedics;  Laterality: Right;   HERNIA REPAIR     IR GENERIC HISTORICAL  05/15/2016   IR US GUIDE VASC ACCESS RIGHT 05/15/2016 Aletta Edouard, MD [facility]   IR GENERIC HISTORICAL  05/15/2016   IR FLUORO GUIDE CV LINE RIGHT 05/15/2016 Aletta Edouard, MD [facility]   IR REMOVAL TUN ACCESS W/ PORT W/O FL MOD SED  04/19/2018   LAPAROSCOPIC CHOLECYSTECTOMY  2004   LAPAROSCOPIC INCISIONAL / UMBILICAL / VENTRAL HERNIA REPAIR  2004   "w/gallbladder OR"   LYMPH NODE BIOPSY N/A 03/26/2016   Procedure: [place] LYMPH NODE BIOPSY;  Surgeon: Everitt Amber, MD;  Location: [facility];  Service: Gynecology;  Laterality: N/A;   REVERSE SHOULDER ARTHROPLASTY Right 10/26/2018   Procedure: REVERSE SHOULDER ARTHROPLASTY;  Surgeon: Nicholes Stairs, MD;  Location: [facility];  Service: Orthopedics;  Laterality: Right;   ROBOTIC ASSISTED TOTAL HYSTERECTOMY WITH BILATERAL SALPINGO OOPHERECTOMY N/A 03/26/2016   Procedure: XI ROBOTIC ASSISTED TOTAL Laproscopic HYSTERECTOMY WITH BILATERAL SALPINGO OOPHORECTOMY;  Surgeon: Everitt Amber, MD;  Location: [facility];  Service: Gynecology;  Laterality: N/A;   TOTAL KNEE ARTHROPLASTY Left 10/09/2013   Procedure: LEFT TOTAL KNEE ARTHROPLASTY;  Surgeon: [name], MD;  Location: [facility];  Service: Orthopedics;  Laterality: Left;   TOTAL KNEE ARTHROPLASTY Right 04/09/2014   Procedure: RIGHT TOTAL KNEE ARTHROPLASTY;  Surgeon: [name], MD;   Location: [facility];  Service: Orthopedics;  Laterality: Right;   TUBAL LIGATION  1982   VARICOSE VEIN SURGERY Bilateral 1968   WISDOM TOOTH EXTRACTION  1963     OB History   No obstetric history on file.      Home Medications    Prior to Admission medications   Medication Sig Start Date End Date Taking? Authorizing Provider  acetaminophen (TYLENOL) 325 MG tablet Take 2 tablets (650 mg total) by mouth every 6 (six) hours as needed for mild pain or moderate pain (pain score 1-3 or temp > 100.5). 04/13/19  Yes Bonnell Public, MD  albuterol (VENTOLIN HFA) 108 (90 Base) MCG/ACT inhaler Inhale 2 puffs into the lungs every 6 (six) hours as needed for wheezing or shortness of breath.   Yes [provider]  allopurinol (ZYLOPRIM) 300 MG tablet Take 300 mg by mouth daily.    Yes [provider]  apixaban (ELIQUIS) 5 MG TABS tablet Take 10 mg by mouth 2 (two) times daily. Shortness of breath   Yes [provider]  baclofen (LIORESAL) 10 MG tablet Take 1 tablet (10 mg total) by mouth 3 (three) times daily as  needed for muscle spasms. 04/12/19  Yes Swayze, Ava, DO  calcium carbonate (TUMS - DOSED IN MG ELEMENTAL CALCIUM) 500 MG chewable tablet Chew 1 tablet by mouth daily.   Yes [provider]  cetirizine (ZYRTEC) 10 MG tablet Take 10 mg by mouth daily.   Yes [provider]  citalopram (CELEXA) 10 MG tablet Take 10 mg by mouth daily. 03/20/19  Yes [provider]  citalopram (CELEXA) 20 MG tablet Take 20 mg by mouth daily.  01/10/15  Yes [provider]  enoxaparin (LOVENOX) 40 MG/0.4ML injection Inject 0.4 mLs (40 mg total) into the skin daily for 30 doses. For 30 days post op for DVT prophylaxis 04/09/19 05/14/19 Yes Prudencio Burly III, PA-C  furosemide (LASIX) 40 MG tablet Take 40 mg by mouth daily.  01/10/15  Yes [provider]  HYDROcodone-acetaminophen (NORCO) 5-325 MG tablet Take 1-2 tablets by mouth every 6 (six)  hours as needed for severe pain (Use tylenol for mild and moderate pain). Patient taking differently: Take 1 tablet by mouth every 6 (six) hours.  04/09/19  Yes Prudencio Burly III, PA-C  hydrOXYzine (ATARAX/VISTARIL) 25 MG tablet Take 25 mg by mouth at bedtime.   Yes [provider]  iron polysaccharides (NIFEREX) 150 MG capsule Take 150 mg by mouth daily.   Yes [provider]  levothyroxine (SYNTHROID, LEVOTHROID) 125 MCG tablet Take 125 mcg by mouth daily at 6 (six) AM.    Yes [provider]  magnesium hydroxide (MILK OF MAGNESIA) 400 MG/5ML suspension Take 30 mLs by mouth daily as needed for mild constipation.   Yes [provider]  Melatonin 3 MG TABS Take 3 mg by mouth at bedtime.    Yes [provider]  metoCLOPramide (REGLAN) 5 MG tablet Take 5 mg by mouth 4 (four) times daily.   Yes [provider]  nystatin (NYSTATIN) powder Apply 1 Bottle topically See admin instructions. Apply topically to abdominal skin folds and under bilateral breast for intertrigo - twice daily - please make sure skin has been cleaned and is dry before applying   Yes [provider]  Omega-3 Fatty Acids (FISH OIL) 1000 MG CAPS Take 1,000 mg by mouth daily.    Yes [provider]  OXYGEN Inhale 2 L into the lungs continuous. To maintain O2 sat greater then 90% every shift   Yes [provider]  pantoprazole (PROTONIX) 40 MG tablet Take 1 tablet (40 mg total) by mouth 2 (two) times daily before a meal. 11/17/18  Yes Gherghe, Vella Redhead, MD  polyethylene glycol (MIRALAX / GLYCOLAX) packet Take 17 g by mouth daily as needed for mild constipation or moderate constipation. Patient taking differently: Take 17 g by mouth 2 (two) times daily.  04/12/14  Yes Perkins, Alexzandrew L, PA-C  pravastatin (PRAVACHOL) 10 MG tablet Take 10 mg by mouth every Thursday. At bedtime 02/14/18  Yes [provider]  pyridOXINE (VITAMIN B-6) 100 MG  tablet Take 100 mg by mouth daily.   Yes [provider]  saccharomyces boulardii (FLORASTOR) 250 MG capsule Take 250 mg by mouth 2 (two) times daily.   Yes [provider]  senna-docusate (SENOKOT-S) 8.6-50 MG tablet Take 1 tablet by mouth daily.   Yes [provider]  tolterodine (DETROL LA) 4 MG 24 hr capsule Take 4 mg by mouth every evening.    Yes [provider]  vitamin B-12 (CYANOCOBALAMIN) 1000 MCG tablet Take 1,000 mcg by mouth daily.  Yes [provider]  vitamin C (ASCORBIC ACID) 500 MG tablet Take 500 mg by mouth daily.   Yes [provider]  Vitamin D, Ergocalciferol, (DRISDOL) 1.25 MG (50000 UT) CAPS capsule Take 50,000 Units by mouth every 7 (seven) days. Monday   Yes [provider]  zolpidem (AMBIEN) 10 MG tablet Take 10 mg by mouth at bedtime. 02/17/19  Yes [provider]  alendronate (FOSAMAX) 70 MG tablet Take 70 mg by mouth every Friday. Take with a full glass of water on an empty stomach.     [provider]    Family History Family History  Problem Relation Age of Onset   Heart disease Mother    Heart disease Father    CVA Father     Social History Social History   Tobacco Use   Smoking status: Never Smoker   Smokeless tobacco: Never Used  Substance Use Topics   Alcohol use: No   Drug use: No     Allergies   Oxybutynin   Review of Systems Review of Systems  Constitutional: Negative for fever.  HENT: Negative for congestion.   Eyes: Negative for visual disturbance.  Respiratory: Positive for cough and shortness of breath.   Cardiovascular: Negative for chest pain.  Gastrointestinal: Negative for abdominal pain, nausea and vomiting.  Genitourinary: Negative for dysuria.  Skin: Negative for rash.  Neurological: Positive for facial asymmetry (unknown to pt, noted on exam.) and weakness (30 minute episode). Negative for dizziness, speech difficulty,  light-headedness, numbness and headaches.     Physical Exam Updated Vital Signs BP 132/67 (BP Location: Right Arm)    Pulse 84    Temp 98.5 F (36.9 C) (Oral)    Resp 18    Ht 5' (1.524 m)    Wt 108.9 kg    SpO2 92%    BMI 46.87 kg/m   Physical Exam Vitals signs and nursing note reviewed.  Constitutional:      General: She is not in acute distress.    Appearance: She is well-developed. She is not diaphoretic.  HENT:     Head: Normocephalic and atraumatic.  Eyes:     General: No visual field deficit.    Conjunctiva/sclera: Conjunctivae normal.  Neck:     Musculoskeletal: Normal range of motion.  Cardiovascular:     Rate and Rhythm: Normal rate and regular rhythm.     Heart sounds: Normal heart sounds. No murmur. No friction rub. No gallop.   Pulmonary:     Effort: Pulmonary effort is normal. No respiratory distress.     Breath sounds: Normal breath sounds. No wheezing or rales.  Abdominal:     General: There is no distension.     Palpations: Abdomen is soft.     Tenderness: There is no abdominal tenderness. There is no guarding.  Musculoskeletal:        General: No tenderness.  Skin:    General: Skin is warm and dry.     Findings: No erythema or rash.  Neurological:     Mental Status: She is alert and oriented to person, place, and time.     GCS: GCS eye subscore is 4. GCS verbal subscore is 5. GCS motor subscore is 6.     Cranial Nerves: Facial asymmetry (right facial droop) present. No dysarthria.     Sensory: Sensation is intact. No sensory deficit.     Motor: Motor function is intact. No pronator drift. Weakness: RUE strength is limited by prior humerus  fx, normal grip strength, RLE strength exam imited by hip fx.     Coordination: Coordination is intact. Coordination normal.      ED Treatments / Results  Labs (all labs ordered are listed, but only abnormal results are displayed) Labs Reviewed  PROTIME-INR - Abnormal; Notable for the following components:       Result Value   Prothrombin Time 15.8 (*)    INR 1.3 (*)    All other components within normal limits  CBC - Abnormal; Notable for the following components:   Hemoglobin 9.5 (*)    HCT 34.4 (*)    MCH 23.7 (*)    MCHC 27.6 (*)    RDW 18.2 (*)    All other components within normal limits  COMPREHENSIVE METABOLIC PANEL - Abnormal; Notable for the following components:   Glucose, Bld 154 (*)    Total Protein 5.5 (*)    Albumin 2.9 (*)    GFR calc non Af Amer 53 (*)    All other components within normal limits  RAPID URINE DRUG SCREEN, HOSP PERFORMED - Abnormal; Notable for the following components:   Opiates POSITIVE (*)    All other components within normal limits  URINALYSIS, ROUTINE W REFLEX MICROSCOPIC - Abnormal; Notable for the following components:   Color, Urine COLORLESS (*)    All other components within normal limits  LIPID PANEL - Abnormal; Notable for the following components:   LDL Cholesterol 107 (*)    All other components within normal limits  BRAIN NATRIURETIC PEPTIDE - Abnormal; Notable for the following components:   B Natriuretic Peptide 185.7 (*)    All other components within normal limits  CBG MONITORING, ED - Abnormal; Notable for the following components:   Glucose-Capillary 162 (*)    All other components within normal limits  I-STAT CHEM 8, ED - Abnormal; Notable for the following components:   Glucose, Bld 143 (*)    Hemoglobin 11.9 (*)    HCT 35.0 (*)    All other components within normal limits  SARS CORONAVIRUS 2 (HOSPITAL ORDER, [place] [place] [facility])  ETHANOL  APTT  DIFFERENTIAL  HEMOGLOBIN A1C    EKG EKG Interpretation  Date/Time:  Sunday May 14 2019 10:26:13 EDT Ventricular Rate:  72 PR Interval:    QRS Duration: 103 QT Interval:  381 QTC Calculation: 417 R Axis:   -22 Text Interpretation:  Sinus rhythm Atrial premature complex Short PR interval Borderline left axis deviation No significant change since last  tracing Confirmed by Gareth Morgan [phone]) on 05/14/2019 12:44:11 PM   Radiology Ct Angio Chest Pe W And/or Wo Contrast  Result Date: 05/14/2019 CLINICAL DATA:  Short of breath.  Hip replacement. EXAM: CT ANGIOGRAPHY CHEST WITH CONTRAST TECHNIQUE: Multidetector CT imaging of the chest was performed using the standard protocol during bolus administration of intravenous contrast. Multiplanar CT image reconstructions and MIPs were obtained to evaluate the vascular anatomy. CONTRAST:  68mL OMNIPAQUE IOHEXOL 350 MG/ML SOLN COMPARISON:  CT 10/27/2018 FINDINGS: Cardiovascular: No filling defects within the pulmonary arteries to suggest acute pulmonary embolism. No acute findings of the aorta or great vessels. No pericardial fluid. Atelectasis in the medial RIGHT lower lobe associated large hiatal hernia which does limit evaluation of the segment. Mediastinum/Nodes: No axillary or supraclavicular adenopathy. No mediastinal hilar adenopathy. Lungs/Pleura: No pulmonary infarction. Atelectasis in the medial RIGHT lower lobe associated with the large hiatal hernia. Upper Abdomen: Large hiatal hernia extending in the medial RIGHT hemithorax. Musculoskeletal: No  aggressive osseous lesion. Review of the MIP images confirms the above findings. IMPRESSION: 1. No evidence acute pulmonary embolism. 2. No acute pulmonary parenchymal findings. 3. Large hiatal hernia with associated RIGHT lower lobe atelectasis. Electronically Signed   By: Suzy Bouchard M.D.   On: 05/14/2019 12:38   Mr Angio Head Wo Contrast  Result Date: 05/14/2019 CLINICAL DATA:  Stroke follow-up.  Right-sided weakness. EXAM: MRI HEAD WITHOUT CONTRAST MRA HEAD WITHOUT CONTRAST TECHNIQUE: Multiplanar, multiecho pulse sequences of the brain and surrounding structures were obtained without intravenous contrast. Angiographic images of the head were obtained using MRA technique without contrast. COMPARISON:  Head CT 05/14/2019 FINDINGS: MRI HEAD FINDINGS  Brain: There is a small acute cortical infarct posteriorly in the left frontal lobe corresponding to the low-density on CT. Mild cerebral atrophy is within normal limits for age. T2 hyperintensities in the cerebral white matter and pons are nonspecific but compatible with mild chronic small vessel ischemic disease. There are subcentimeter chronic infarcts in the cerebellum. Vascular: Major intracranial vascular flow voids are preserved. Skull and upper cervical spine: Unremarkable bone marrow signal. Disc degeneration at C3-4. Sinuses/Orbits: Bilateral cataract extraction. Mild right sphenoid sinus mucosal thickening. Clear mastoid air cells. Other: None. MRA HEAD FINDINGS The study is mildly to moderately motion degraded. The visualized distal vertebral arteries are widely patent to the basilar with the right being strongly dominant. Patent right PICA, left AICA, and bilateral SCA origins are visualized. The basilar artery is widely patent. There are small posterior communicating arteries bilaterally. Both PCAs are patent without evidence of significant proximal stenosis. The internal carotid arteries are patent from skull base to carotid termini. There is moderate motion artifact through the carotid siphons without definite flow limiting stenosis. ACAs and MCAs are patent with mild branch vessel irregularity but no evidence of proximal branch occlusion or flow limiting proximal stenosis. No aneurysm is identified. IMPRESSION: 1. Small acute posterior left frontal infarct. 2. Mild chronic small vessel ischemic disease. 3. Motion degraded head MRA without evidence of large vessel occlusion or flow limiting proximal stenosis. Electronically Signed   By: Logan Bores M.D.   On: 05/14/2019 18:21   Mr Brain Wo Contrast  Result Date: 05/14/2019 CLINICAL DATA:  Stroke follow-up.  Right-sided weakness. EXAM: MRI HEAD WITHOUT CONTRAST MRA HEAD WITHOUT CONTRAST TECHNIQUE: Multiplanar, multiecho pulse sequences of the  brain and surrounding structures were obtained without intravenous contrast. Angiographic images of the head were obtained using MRA technique without contrast. COMPARISON:  Head CT 05/14/2019 FINDINGS: MRI HEAD FINDINGS Brain: There is a small acute cortical infarct posteriorly in the left frontal lobe corresponding to the low-density on CT. Mild cerebral atrophy is within normal limits for age. T2 hyperintensities in the cerebral white matter and pons are nonspecific but compatible with mild chronic small vessel ischemic disease. There are subcentimeter chronic infarcts in the cerebellum. Vascular: Major intracranial vascular flow voids are preserved. Skull and upper cervical spine: Unremarkable bone marrow signal. Disc degeneration at C3-4. Sinuses/Orbits: Bilateral cataract extraction. Mild right sphenoid sinus mucosal thickening. Clear mastoid air cells. Other: None. MRA HEAD FINDINGS The study is mildly to moderately motion degraded. The visualized distal vertebral arteries are widely patent to the basilar with the right being strongly dominant. Patent right PICA, left AICA, and bilateral SCA origins are visualized. The basilar artery is widely patent. There are small posterior communicating arteries bilaterally. Both PCAs are patent without evidence of significant proximal stenosis. The internal carotid arteries are patent from skull base to carotid  termini. There is moderate motion artifact through the carotid siphons without definite flow limiting stenosis. ACAs and MCAs are patent with mild branch vessel irregularity but no evidence of proximal branch occlusion or flow limiting proximal stenosis. No aneurysm is identified. IMPRESSION: 1. Small acute posterior left frontal infarct. 2. Mild chronic small vessel ischemic disease. 3. Motion degraded head MRA without evidence of large vessel occlusion or flow limiting proximal stenosis. Electronically Signed   By: Logan Bores M.D.   On: 05/14/2019 18:21   Ct  Head Code Stroke Wo Contrast  Result Date: 05/14/2019 CLINICAL DATA:  Code stroke.  Right facial droop EXAM: CT HEAD WITHOUT CONTRAST TECHNIQUE: Contiguous axial images were obtained from the base of the skull through the vertex without intravenous contrast. COMPARISON:  10/19/2018 head CT report, images not available FINDINGS: Brain: Small area of cortex blurring in the posterior left frontal lobe. Mild chronic small vessel ischemic change in the cerebral white matter. No hemorrhage, hydrocephalus, or collection. Mild for age volume loss. Vascular: No hyperdense vessel. Skull: Negative Sinuses/Orbits: Negative Other: These results were communicated to Dr. Leonie Man at 11:50 amon 8/16/2020by text page via the [facility] messaging system. ASPECTS [facility] Stroke Program Early CT Score) - Ganglionic level infarction (caudate, lentiform nuclei, internal capsule, insula, M1-M3 cortex): 7 - Supraganglionic infarction (M4-M6 cortex): 2 Total score (0-10 with 10 being normal): 9 IMPRESSION: 1. Suspect small left posterior frontal cortex infarct. ASPECTS is 9. 2. No acute hemorrhage. Electronically Signed   By: Monte Fantasia M.D.   On: 05/14/2019 11:52    Procedures .Critical Care Performed by: Gareth Morgan, MD Authorized by: Gareth Morgan, MD   Critical care provider statement:    Critical care time (minutes):  30   Critical care was time spent personally by me on the following activities:  Discussions with consultants, evaluation of patient's response to treatment, examination of patient, ordering and performing treatments and interventions, ordering and review of laboratory studies, ordering and review of radiographic studies, pulse oximetry, re-evaluation of patient's condition, obtaining history from patient or surrogate and review of old charts   (including critical care time)  Medications Ordered in ED Medications  iohexol (OMNIPAQUE) 350 MG/ML injection 80 mL (80 mLs Intravenous Contrast Given  05/14/19 1148)     Initial Impression / Assessment and Plan / ED Course  I have reviewed the triage vital signs and the nursing notes.  Pertinent labs & imaging results that were available during my care of the patient were reviewed by me and considered in my medical decision making (see chart for details).        84yo female with history of CHF, remote DVT not on anticoagulation, hyperlipidemia, hypertension, DM, uterine cancer, recent hip fx in rehab, presents with concern for shortness of breath and episode of right sided arm weakness.  Hypoxic on arrival, no report of continuing focal weakness, however on my exam noted right facial droop and given limitations of RUE and RLE exam by fracture with right facial droop and within stroke window, called Code Stroke and Dr. Leonie Man came to bedside to evaluate.  Given mild and improving symptoms, she is not tPA or intervention candidate. CT does show signs of CVA.   CT PE study without signs of PE, pneumonia or other abnormalities. Question atelectasis contributing to cough, hypoxia.  COVID negative.   Plan to admit for continued CVA evaluation.  Final Clinical Impressions(s) / ED Diagnoses   Final diagnoses:  Cerebrovascular accident (CVA), unspecified mechanism ([place])  Shortness  of breath    ED Discharge Orders    None       Gareth Morgan, MD 05/14/19 2130

## 2019-05-15 ENCOUNTER — Inpatient Hospital Stay (HOSPITAL_COMMUNITY): Payer: Medicare Other

## 2019-05-15 ENCOUNTER — Other Ambulatory Visit: Payer: Self-pay | Admitting: Cardiology

## 2019-05-15 DIAGNOSIS — I6389 Other cerebral infarction: Secondary | ICD-10-CM

## 2019-05-15 DIAGNOSIS — I639 Cerebral infarction, unspecified: Secondary | ICD-10-CM

## 2019-05-15 DIAGNOSIS — I82409 Acute embolism and thrombosis of unspecified deep veins of unspecified lower extremity: Secondary | ICD-10-CM

## 2019-05-15 DIAGNOSIS — E1169 Type 2 diabetes mellitus with other specified complication: Secondary | ICD-10-CM

## 2019-05-15 HISTORY — DX: Cerebral infarction, unspecified: I63.9

## 2019-05-15 LAB — ECHOCARDIOGRAM COMPLETE BUBBLE STUDY
Height: 60 in
Weight: 3840 oz

## 2019-05-15 LAB — LIPID PANEL
Cholesterol: 174 mg/dL (ref 0–200)
HDL: 55 mg/dL (ref >40)
LDL Cholesterol: 109 mg/dL — ABNORMAL HIGH (ref 0–99)
Total CHOL/HDL Ratio: 3.2 RATIO
Triglycerides: 51 mg/dL (ref <150)
VLDL: 10 mg/dL (ref 0–40)

## 2019-05-15 LAB — HEMOGLOBIN A1C
Hgb A1c MFr Bld: 5.2 % (ref 4.8–5.6)
Mean Plasma Glucose: 102.54 mg/dL

## 2019-05-15 MED ORDER — ASPIRIN EC 325 MG PO TBEC
325.0000 mg | DELAYED_RELEASE_TABLET | Freq: Every day | ORAL | Status: DC
  Administered 2019-05-15 – 2019-05-16 (×2): 325 mg via ORAL
  Filled 2019-05-15 (×2): qty 1

## 2019-05-15 MED ORDER — CLOPIDOGREL BISULFATE 75 MG PO TABS
75.0000 mg | ORAL_TABLET | Freq: Every day | ORAL | Status: DC
  Administered 2019-05-15 – 2019-05-16 (×2): 75 mg via ORAL
  Filled 2019-05-15 (×2): qty 1

## 2019-05-15 MED ORDER — WHITE PETROLATUM EX OINT
TOPICAL_OINTMENT | CUTANEOUS | Status: AC
  Administered 2019-05-15: 20:00:00
  Filled 2019-05-15: qty 28.35

## 2019-05-15 MED ORDER — GADOBUTROL 1 MMOL/ML IV SOLN
10.0000 mL | Freq: Once | INTRAVENOUS | Status: AC | PRN
  Administered 2019-05-15: 10 mL via INTRAVENOUS

## 2019-05-15 MED ORDER — LEVOTHYROXINE SODIUM 25 MCG PO TABS
125.0000 ug | ORAL_TABLET | Freq: Every day | ORAL | Status: DC
  Administered 2019-05-16: 125 ug via ORAL
  Filled 2019-05-15: qty 1

## 2019-05-15 MED ORDER — ENOXAPARIN SODIUM 40 MG/0.4ML ~~LOC~~ SOLN
40.0000 mg | Freq: Every day | SUBCUTANEOUS | Status: DC
  Administered 2019-05-15 – 2019-05-16 (×2): 40 mg via SUBCUTANEOUS
  Filled 2019-05-15 (×3): qty 0.4

## 2019-05-15 NOTE — Progress Notes (Signed)
STROKE TEAM PROGRESS NOTE   INTERVAL HISTORY Patient states she is doing better today.  Her speech is improved.  She is right hand weakness is also a lot better.  [facility] Kitchen MRI brain confirms a small left frontal MCA branch infarct.  MRA of the brain and neck are suboptimal due to motion artifact but suggest 50% right ICA stenosis.  LDL cholesterol is elevated at 109 mg percent.  Hemoglobin A1c is 5.2.  Lower extremity venous Dopplers and echocardiogram are pending Carotid ultrasound shows only 1-39% bilateral stenosis.  2D echo shows normal ejection fraction.  Poor acoustic window limits ability to do bubble study.  TCD bubble study was also attempted but poor acoustic windows hence could not be done Vitals:   05/15/19 0700 05/15/19 0730 05/15/19 0800 05/15/19 0900  BP: (!) 162/66 139/66 (!) 159/76 104/66  Pulse: 70 70 69 72  Resp: 20 (!) 22 20 18   Temp:    98.5 F (36.9 C)  TempSrc:    Oral  SpO2: 97% 96% 97% 99%  Weight:      Height:        CBC:  Recent Labs  Lab 05/14/19 1130 05/14/19 1132  WBC 6.4  --   NEUTROABS 4.4  --   HGB 9.5* 11.9*  HCT 34.4* 35.0*  MCV 85.8  --   PLT 340  --     Basic Metabolic Panel:  Recent Labs  Lab 05/14/19 1130 05/14/19 1132  NA 136 138  K 4.2 4.2  CL 98 98  CO2 27  --   GLUCOSE 154* 143*  BUN 19 21  CREATININE 0.99 1.00  CALCIUM 9.2  --    Lipid Panel:     Component Value Date/Time   CHOL 174 05/15/2019 0208   TRIG 51 05/15/2019 0208   HDL 55 05/15/2019 0208   CHOLHDL 3.2 05/15/2019 0208   VLDL 10 05/15/2019 0208   LDLCALC 109 (H) 05/15/2019 0208   HgbA1c:  Lab Results  Component Value Date   HGBA1C 5.2 05/15/2019   Urine Drug Screen:     Component Value Date/Time   LABOPIA POSITIVE (A) 05/14/2019 1331   COCAINSCRNUR NONE DETECTED 05/14/2019 1331   LABBENZ NONE DETECTED 05/14/2019 1331   AMPHETMU NONE DETECTED 05/14/2019 1331   THCU NONE DETECTED 05/14/2019 1331   LABBARB NONE DETECTED 05/14/2019 1331    Alcohol Level      Component Value Date/Time   ETH <10 05/14/2019 1130    IMAGING Ct Angio Chest Pe W And/or Wo Contrast  Result Date: 05/14/2019 CLINICAL DATA:  Short of breath.  Hip replacement. EXAM: CT ANGIOGRAPHY CHEST WITH CONTRAST TECHNIQUE: Multidetector CT imaging of the chest was performed using the standard protocol during bolus administration of intravenous contrast. Multiplanar CT image reconstructions and MIPs were obtained to evaluate the vascular anatomy. CONTRAST:  62mL OMNIPAQUE IOHEXOL 350 MG/ML SOLN COMPARISON:  CT 10/27/2018 FINDINGS: Cardiovascular: No filling defects within the pulmonary arteries to suggest acute pulmonary embolism. No acute findings of the aorta or great vessels. No pericardial fluid. Atelectasis in the medial RIGHT lower lobe associated large hiatal hernia which does limit evaluation of the segment. Mediastinum/Nodes: No axillary or supraclavicular adenopathy. No mediastinal hilar adenopathy. Lungs/Pleura: No pulmonary infarction. Atelectasis in the medial RIGHT lower lobe associated with the large hiatal hernia. Upper Abdomen: Large hiatal hernia extending in the medial RIGHT hemithorax. Musculoskeletal: No aggressive osseous lesion. Review of the MIP images confirms the above findings. IMPRESSION: 1. No evidence acute pulmonary embolism.  2. No acute pulmonary parenchymal findings. 3. Large hiatal hernia with associated RIGHT lower lobe atelectasis. Electronically Signed   By: Suzy Bouchard M.D.   On: 05/14/2019 12:38   Mr Angio Head Wo Contrast  Result Date: 05/14/2019 CLINICAL DATA:  Stroke follow-up.  Right-sided weakness. EXAM: MRI HEAD WITHOUT CONTRAST MRA HEAD WITHOUT CONTRAST TECHNIQUE: Multiplanar, multiecho pulse sequences of the brain and surrounding structures were obtained without intravenous contrast. Angiographic images of the head were obtained using MRA technique without contrast. COMPARISON:  Head CT 05/14/2019 FINDINGS: MRI HEAD FINDINGS Brain: There is a  small acute cortical infarct posteriorly in the left frontal lobe corresponding to the low-density on CT. Mild cerebral atrophy is within normal limits for age. T2 hyperintensities in the cerebral white matter and pons are nonspecific but compatible with mild chronic small vessel ischemic disease. There are subcentimeter chronic infarcts in the cerebellum. Vascular: Major intracranial vascular flow voids are preserved. Skull and upper cervical spine: Unremarkable bone marrow signal. Disc degeneration at C3-4. Sinuses/Orbits: Bilateral cataract extraction. Mild right sphenoid sinus mucosal thickening. Clear mastoid air cells. Other: None. MRA HEAD FINDINGS The study is mildly to moderately motion degraded. The visualized distal vertebral arteries are widely patent to the basilar with the right being strongly dominant. Patent right PICA, left AICA, and bilateral SCA origins are visualized. The basilar artery is widely patent. There are small posterior communicating arteries bilaterally. Both PCAs are patent without evidence of significant proximal stenosis. The internal carotid arteries are patent from skull base to carotid termini. There is moderate motion artifact through the carotid siphons without definite flow limiting stenosis. ACAs and MCAs are patent with mild branch vessel irregularity but no evidence of proximal branch occlusion or flow limiting proximal stenosis. No aneurysm is identified. IMPRESSION: 1. Small acute posterior left frontal infarct. 2. Mild chronic small vessel ischemic disease. 3. Motion degraded head MRA without evidence of large vessel occlusion or flow limiting proximal stenosis. Electronically Signed   By: Logan Bores M.D.   On: 05/14/2019 18:21   Mr Angiogram Neck W Or Wo Contrast  Result Date: 05/15/2019 CLINICAL DATA:  Follow-up examination for acute stroke. EXAM: MRA NECK WITHOUT AND WITH CONTRAST TECHNIQUE: Multiplanar and multiecho pulse sequences of the neck were obtained  without and with intravenous contrast. Angiographic images of the neck were obtained using MRA technique without and with intravenous contrast. CONTRAST:  10 cc of Gadavist. COMPARISON:  Prior MRI and MRA of the head from 04/13/2019. FINDINGS: Source images reviewed. Visualized aortic arch of normal caliber with normal branch pattern. No hemodynamically significant stenosis seen about the origin of the great vessels. Visualized subclavian arteries widely patent. Right common carotid artery widely patent from its origin to the bifurcation without stenosis. Mild atherosclerotic change about the right bifurcation with associated short-segment stenosis of up to approximately 50% by NASCET criteria. Right ICA widely patent distally to the circle-of-Willis without stenosis, dissection, or occlusion. Left CCA tortuous but widely patent from its origin to the bifurcation without stenosis. Mild atheromatous irregularity about the left bifurcation/proximal left ICA without hemodynamically significant stenosis. Left ICA tortuous but widely patent distally to the circle-of-Willis without stenosis, dissection, or occlusion. Left vertebral artery diffusely hypoplastic and arises separately from the aortic arch. Right vertebral artery arises from the right subclavian artery. Vertebral arteries patent within the neck without stenosis, dissection, or occlusion. IMPRESSION: 1. Short-segment atheromatous stenosis of up to approximately 50% by NASCET criteria at the origin of the right ICA. 2. No hemodynamically  significant or critical flow limiting stenosis within the left carotid artery system. 3. Wide patency of both vertebral arteries within the neck. Right vertebral artery dominant. 4. Diffuse tortuosity of the major arterial vasculature of the neck, suggesting chronic underlying hypertension. Electronically Signed   By: Jeannine Boga M.D.   On: 05/15/2019 04:16   Mr Brain Wo Contrast  Result Date: 05/14/2019 CLINICAL  DATA:  Stroke follow-up.  Right-sided weakness. EXAM: MRI HEAD WITHOUT CONTRAST MRA HEAD WITHOUT CONTRAST TECHNIQUE: Multiplanar, multiecho pulse sequences of the brain and surrounding structures were obtained without intravenous contrast. Angiographic images of the head were obtained using MRA technique without contrast. COMPARISON:  Head CT 05/14/2019 FINDINGS: MRI HEAD FINDINGS Brain: There is a small acute cortical infarct posteriorly in the left frontal lobe corresponding to the low-density on CT. Mild cerebral atrophy is within normal limits for age. T2 hyperintensities in the cerebral white matter and pons are nonspecific but compatible with mild chronic small vessel ischemic disease. There are subcentimeter chronic infarcts in the cerebellum. Vascular: Major intracranial vascular flow voids are preserved. Skull and upper cervical spine: Unremarkable bone marrow signal. Disc degeneration at C3-4. Sinuses/Orbits: Bilateral cataract extraction. Mild right sphenoid sinus mucosal thickening. Clear mastoid air cells. Other: None. MRA HEAD FINDINGS The study is mildly to moderately motion degraded. The visualized distal vertebral arteries are widely patent to the basilar with the right being strongly dominant. Patent right PICA, left AICA, and bilateral SCA origins are visualized. The basilar artery is widely patent. There are small posterior communicating arteries bilaterally. Both PCAs are patent without evidence of significant proximal stenosis. The internal carotid arteries are patent from skull base to carotid termini. There is moderate motion artifact through the carotid siphons without definite flow limiting stenosis. ACAs and MCAs are patent with mild branch vessel irregularity but no evidence of proximal branch occlusion or flow limiting proximal stenosis. No aneurysm is identified. IMPRESSION: 1. Small acute posterior left frontal infarct. 2. Mild chronic small vessel ischemic disease. 3. Motion degraded  head MRA without evidence of large vessel occlusion or flow limiting proximal stenosis. Electronically Signed   By: Logan Bores M.D.   On: 05/14/2019 18:21   Ct Head Code Stroke Wo Contrast  Result Date: 05/14/2019 CLINICAL DATA:  Code stroke.  Right facial droop EXAM: CT HEAD WITHOUT CONTRAST TECHNIQUE: Contiguous axial images were obtained from the base of the skull through the vertex without intravenous contrast. COMPARISON:  10/19/2018 head CT report, images not available FINDINGS: Brain: Small area of cortex blurring in the posterior left frontal lobe. Mild chronic small vessel ischemic change in the cerebral white matter. No hemorrhage, hydrocephalus, or collection. Mild for age volume loss. Vascular: No hyperdense vessel. Skull: Negative Sinuses/Orbits: Negative Other: These results were communicated to Dr. Leonie Man at 11:50 amon 8/16/2020by text page via the [facility] messaging system. ASPECTS [facility] Stroke Program Early CT Score) - Ganglionic level infarction (caudate, lentiform nuclei, internal capsule, insula, M1-M3 cortex): 7 - Supraganglionic infarction (M4-M6 cortex): 2 Total score (0-10 with 10 being normal): 9 IMPRESSION: 1. Suspect small left posterior frontal cortex infarct. ASPECTS is 9. 2. No acute hemorrhage. Electronically Signed   By: Monte Fantasia M.D.   On: 05/14/2019 11:52    PHYSICAL EXAM Pleasant elderly Caucasian lady not in distress. . Afebrile. Head is nontraumatic. Neck is supple without bruit.    Cardiac exam no murmur or gallop. Lungs are clear to auscultation. Distal pulses are well felt. Neurological Examination  She is awake alert  oriented to time place and person.  She follows commands well.  Speech is clear without dysarthria or aphasia.  Extraocular movements are full range without nystagmus.  Visual fields seem full to bedside confrontational testing.  She has mild right low nasolabial fold asymmetry.  Tongue midline.  Motor system exam reveals no upper extremity  drift.  Right lower extremity exam is limited due to recent hip fracture and pain.  She has good strength proximally in the upper extremities.  She has good grip strength on the right.  Fine finger movements are diminished on the right.  She orbits left over right upper extremity.  Right lower extremity motor testing is limited due to recent hip surgery. She has good distal strength in right lower extremity.  Normal strength on the left.  Sensation is symmetric bilaterally.  Deep tendon reflexes are symmetric.  Plantars are downgoing.  Gait not tested.  NIH stroke scale 1 ASSESSMENT/PLAN Ms. ELINORA WEIGAND is a 84 y.o. female with history of cancer, DM2, OSA ( CPAP), HTN, HLD, hypothyroidism, CHF, DVT presenting to ED from SNF with SOB and R arm and face > leg weakness.   Stroke:   Left frontal infarct embolic secondary to unknown small vessel disease source  Code Stroke CT head suspect small left posterior frontal cortical infarct.  No hemorrhage ASPECTS 9    MRI mild posterior left frontal infarct.Small vessel disease. Atrophy.   MRA head motion degraded.  No LVO.  MRA neck right ICA 50% stenosis.  Left open.  Tortuosity in the neck  Carotid Doppler bilateral 1-39% carotid stenosis.  2D Echo normal ejection fraction.  Mildly dilated left atrium.  Bubble study could not be done due to poor acoustic window.  Lower extremity Doppler negative for DVT  TCD with bubble attempted but could not be done due to poor bony windows  LDL 109  HgbA1c 5.2  Lovenox 40 mg sq daily for VTE prophylaxis  lovenox prior to admission 30d treatment ended May 14, 2019, now on aspirin 81 mg daily and clopidogrel 75 mg daily. Continue DAPT at d/c.  Therapy recommendations:  pending   Disposition:  pending   Hypertension  Stable . Permissive hypertension (OK if < 220/120) but gradually normalize in 5-7 days . Long-term BP goal normotensive  Hyperlipidemia  Home meds:  Pravachol 10 and omega 3,  pravachol resumed in hospital  LDL 109, goal < 70  Continue statin at discharge  Diabetes type II Controlled  HgbA1c 5.2, goal < 7.0  Other Stroke Risk Factors  Advanced age  Morbid Obesity, Body mass index is 46.87 kg/m., recommend weight loss, diet and exercise as appropriate   Family hx stroke (father)  Obstructive sleep apnea, has CPAP   Chronic diastolic congestive heart failure  Other Active Problems  Hypothyroidism on synthroid  Anxiety/depression on Celexa  Recent fracture right femur s/p OR 03/2019 to SNF for rehab, discharged April 13, 2019 on Lovenox 40 mg x 30 days postop  Chronic microcytic anemia  Hiatal hernia history of GI bleed  Hospital day # 1  I have personally obtained history,examined this patient, reviewed notes, independently viewed imaging studies, participated in medical decision making and plan of care.ROS completed by me personally and pertinent positives fully documented  I have made any additions or clarifications directly to the above note.  Patient presented with sudden onset of expressive language difficulties and mild right-sided weakness which appears to have improved and MRI confirms embolic left MCA branch infarct.  Etiology likely cryptogenic.  Recommend aspirin and Plavix for 3 weeks followed by aspirin alone.  Outpatient 30-day heart monitor.  Patient is not a good long-term candidate for anticoagulation given history of recent GI bleed hence will not consider TEE and loop recorder. Follow-up as outpatient in the stroke clinic in 6 weeks.  Stroke team will sign off.  Discussed with Dr. [place]  Greater than 50% time during this 25-minute visit was spent on counseling and coordination of care about embolic stroke and discussion about prevention and treatment and answering questions. Antony Contras, MD Medical Director [facility] Pager: [phone] 05/15/2019 3:28 PM   To contact Stroke Continuity provider, please refer to  [email or web address] After hours, contact General Neurology

## 2019-05-15 NOTE — ED Notes (Signed)
Patient transported to MRI 

## 2019-05-15 NOTE — ED Triage Notes (Signed)
TC to [name] ,Pt's niece  To report pt being moved to [place] room 2.

## 2019-05-15 NOTE — Progress Notes (Signed)
Bilateral carotid duplex completed. Preliminary results in Chart review CV [place], [place] 05/15/2019, 2:00 pm

## 2019-05-15 NOTE — ED Notes (Signed)
Pt returned from MRI. Monitoring equipment replaced. Blankets given.

## 2019-05-15 NOTE — Progress Notes (Signed)

## 2019-05-15 NOTE — ED Notes (Signed)
ED TO INPATIENT HANDOFF REPORT  ED Nurse Name and Phone #:   S Name/Age/Gender Melinda Kerr 84 y.o. female Room/Bed: 040C/040C  Code Status   Code Status: Full Code  Home/SNF/Other Rehab Patient oriented to: self, place, time and situation Is this baseline? Yes   Triage Complete: Triage complete  Chief Complaint tia  Triage Note From SNF following increased dyspnea this AM with sats 88%  Had breakfast then had R side weakness, dropping her coffee.  On arrival pt has upper airway expiatory wheezing with c/o SOB.   Equal grip strength anad alert and oriented x 4.  Pt had hip fx and went to this center on 04-08-2019 and had bout of pneumonia finishing omnicef on 05-08-2019.  Plan to finish PT and possible placement in ALF.  Cough starting this AM after breathing tx.   Denies any urinary changes, CP or abd pain.     Allergies Allergies  Allergen Reactions  . Oxybutynin Other (See Comments)    Bleeding gums, felt sick    Level of Care/Admitting Diagnosis ED Disposition    ED Disposition Condition [place]: [place] [[id number]]  Level of Care: Telemetry Cardiac [103]  Covid Evaluation: Asymptomatic Screening Protocol (No Symptoms)  Diagnosis: TIA (transient ischemic attack) [086578]  Admitting Physician: Merton Border [[id number]]  Attending Physician: Laren Everts, ALI Marshal.Browner  Estimated length of stay: past midnight tomorrow  Certification:: I certify this patient will need inpatient services for at least 2 midnights  PT Class (Do Not Modify): Inpatient [101]  PT Acc Code (Do Not Modify): Private [1]       B Medical/Surgery History Past Medical History:  Diagnosis Date  . Arthritis    "shoulders, knees, lower back" (07/14/2017)  . CHF (congestive heart failure) ([place])    "while in hospital w/hysterectomy in 2017"  . Chronic lower back pain   . Depression   . Diverticulitis of large intestine with abscess 07/13/2017  . DVT (deep venous  thrombosis) ([place])   . Dyspnea    "cause I'm over weight"  . Esophageal reflux   . Gout   . Hyperlipidemia    under control  . Hypertension   . Hypothyroidism   . Morbid obesity with BMI of 40.0-44.9, adult ([place])   . Nocturia   . OSA on CPAP    "not wearing it when I sleep in my lift chair" (07/14/2017)  . Osteoarthritis   . Phlebitis    hx of  . Pneumonia ?1989  . Spondylosis    with scoliosis  . Type II diabetes mellitus (HCC)    borderline , diet controlled   . Urinary frequency   . Uterine cancer (HCC)    S/P chemo, radiation, hysterectomy   Past Surgical History:  Procedure Laterality Date  . APPENDECTOMY    . CATARACT EXTRACTION, BILATERAL Bilateral 2018  . COLONOSCOPY    . DILATATION & CURETTAGE/HYSTEROSCOPY WITH MYOSURE N/A 03/04/2016   Procedure: DILATATION & CURETTAGE/HYSTEROSCOPY ;  Surgeon: Azucena Fallen, MD;  Location: [facility];  Service: Gynecology;  Laterality: N/A;  polyp removal   . ESOPHAGOGASTRODUODENOSCOPY (EGD) WITH PROPOFOL N/A 11/14/2018   Procedure: ESOPHAGOGASTRODUODENOSCOPY (EGD) WITH PROPOFOL;  Surgeon: Wonda Horner, MD;  Location: [facility];  Service: Endoscopy;  Laterality: N/A;  . FEMUR IM NAIL Right 04/09/2019   Procedure: INTRAMEDULLARY (IM) NAIL INTERTROCH;  Surgeon: Renette Butters, MD;  Location: [facility];  Service: Orthopedics;  Laterality: Right;  . HERNIA REPAIR    .  IR GENERIC HISTORICAL  05/15/2016   IR US GUIDE VASC ACCESS RIGHT 05/15/2016 Aletta Edouard, MD [facility]  . IR GENERIC HISTORICAL  05/15/2016   IR FLUORO GUIDE CV LINE RIGHT 05/15/2016 Aletta Edouard, MD [facility]  . IR REMOVAL TUN ACCESS W/ PORT W/O FL MOD SED  04/19/2018  . LAPAROSCOPIC CHOLECYSTECTOMY  2004  . LAPAROSCOPIC INCISIONAL / UMBILICAL / VENTRAL HERNIA REPAIR  2004   "w/gallbladder OR"  . LYMPH NODE BIOPSY N/A 03/26/2016   Procedure: [place] LYMPH NODE BIOPSY;  Surgeon: Everitt Amber, MD;  Location: [facility];  Service: Gynecology;  Laterality: N/A;  . REVERSE  SHOULDER ARTHROPLASTY Right 10/26/2018   Procedure: REVERSE SHOULDER ARTHROPLASTY;  Surgeon: Nicholes Stairs, MD;  Location: [facility];  Service: Orthopedics;  Laterality: Right;  . ROBOTIC ASSISTED TOTAL HYSTERECTOMY WITH BILATERAL SALPINGO OOPHERECTOMY N/A 03/26/2016   Procedure: XI ROBOTIC ASSISTED TOTAL Laproscopic HYSTERECTOMY WITH BILATERAL SALPINGO OOPHORECTOMY;  Surgeon: Everitt Amber, MD;  Location: [facility];  Service: Gynecology;  Laterality: N/A;  . TOTAL KNEE ARTHROPLASTY Left 10/09/2013   Procedure: LEFT TOTAL KNEE ARTHROPLASTY;  Surgeon: [name], MD;  Location: [facility];  Service: Orthopedics;  Laterality: Left;  . TOTAL KNEE ARTHROPLASTY Right 04/09/2014   Procedure: RIGHT TOTAL KNEE ARTHROPLASTY;  Surgeon: [name], MD;  Location: [facility];  Service: Orthopedics;  Laterality: Right;  . TUBAL LIGATION  1982  . VARICOSE VEIN SURGERY Bilateral 1968  . [place]     A IV Location/Drains/Wounds Patient Lines/Drains/Airways Status   Active Line/Drains/Airways    Name:   Placement date:   Placement time:   Site:   Days:   Peripheral IV 04/08/19 Right Hand   04/08/19    -    Hand   37   Peripheral IV 04/08/19 Right Antecubital   04/08/19    1426    Antecubital   37   Peripheral IV 05/14/19 Left Antecubital   05/14/19    1000    Antecubital   1   External Urinary Catheter   04/08/19    1337    -   37   Incision (Closed) 10/26/18 Shoulder Right   10/26/18    1808     201   Incision (Closed) 04/09/19 Hip Right   04/09/19    0855     36   Incision - 5 Ports Abdomen Umbilicus Upper;Mid Left;Medial;Mid Left;Lateral;Mid Right;Medial;Mid   03/26/16    1140     1145          Intake/Output Last 24 hours No intake or output data in the 24 hours ending 05/15/19 9485  Labs/Imaging Results for orders placed or performed during the hospital encounter of 05/14/19 (from the past 48 hour(s))  CBG monitoring, ED     Status: Abnormal   Collection Time: 05/14/19 10:34  AM  Result Value Ref Range   Glucose-Capillary 162 (H) 70 - 99 mg/dL  Ethanol     Status: None   Collection Time: 05/14/19 11:30 AM  Result Value Ref Range   Alcohol, Ethyl (B) <10 <10 mg/dL    Comment: (NOTE) Lowest detectable limit for serum alcohol is 10 mg/dL. For medical purposes only. Performed at [facility], [place] [address], [place], [place] [address]   Protime-INR     Status: Abnormal   Collection Time: 05/14/19 11:30 AM  Result Value Ref Range   Prothrombin Time 15.8 (H) 11.4 - 15.2 seconds   INR  1.3 (H) 0.8 - 1.2    Comment: (NOTE) INR goal varies based on device and disease states. Performed at [facility], [place] [address] [place], [place] [address]   APTT     Status: None   Collection Time: 05/14/19 11:30 AM  Result Value Ref Range   aPTT 32 24 - 36 seconds    Comment: Performed at [facility] [address] [place], [place] [address]  CBC     Status: Abnormal   Collection Time: 05/14/19 11:30 AM  Result Value Ref Range   WBC 6.4 4.0 - 10.5 K/uL   RBC 4.01 3.87 - 5.11 MIL/uL   Hemoglobin 9.5 (L) 12.0 - 15.0 g/dL   HCT 34.4 (L) 36.0 - 46.0 %   MCV 85.8 80.0 - 100.0 fL   MCH 23.7 (L) 26.0 - 34.0 pg   MCHC 27.6 (L) 30.0 - 36.0 g/dL   RDW 18.2 (H) 11.5 - 15.5 %   Platelets 340 150 - 400 K/uL   nRBC 0.0 0.0 - 0.2 %    Comment: Performed at [facility] [address] [place], [place] [address]  Differential     Status: None   Collection Time: 05/14/19 11:30 AM  Result Value Ref Range   Neutrophils Relative % 68 %   Neutro Abs 4.4 1.7 - 7.7 K/uL   Lymphocytes Relative 19 %   Lymphs Abs 1.2 0.7 - 4.0 K/uL   Monocytes Relative 12 %   Monocytes Absolute 0.8 0.1 - 1.0 K/uL   Eosinophils Relative 0 %   Eosinophils Absolute 0.0 0.0 - 0.5 K/uL   Basophils Relative 1 %   Basophils Absolute 0.0 0.0 - 0.1 K/uL   Immature Granulocytes 0 %   Abs Immature Granulocytes 0.02 0.00 - 0.07 K/uL    Comment: Performed at [facility] [address] [place], [place] [address]  Comprehensive metabolic panel     Status: Abnormal   Collection Time: 05/14/19 11:30 AM  Result Value Ref Range   Sodium 136 135 - 145 mmol/L   Potassium 4.2 3.5 - 5.1 mmol/L   Chloride 98 98 - 111 mmol/L   CO2 27 22 - 32 mmol/L   Glucose, Bld 154 (H) 70 - 99 mg/dL   BUN 19 8 - 23 mg/dL   Creatinine, Ser 0.99 0.44 - 1.00 mg/dL   Calcium 9.2 8.9 - 10.3 mg/dL   Total Protein 5.5 (L) 6.5 - 8.1 g/dL   Albumin 2.9 (L) 3.5 - 5.0 g/dL   AST 21 15 - 41 U/L   ALT 13 0 - 44 U/L   Alkaline Phosphatase 112 38 - 126 U/L   Total Bilirubin 0.3 0.3 - 1.2 mg/dL   GFR calc non Af Amer 53 (L) >60 mL/min   GFR calc Af Amer >60 >60 mL/min   Anion gap 11 5 - 15    Comment: Performed at [facility] [address] [place], [place] [address]  Lipid panel     Status: Abnormal   Collection Time: 05/14/19 11:30 AM  Result Value Ref Range   Cholesterol 174 0 - 200 mg/dL   Triglycerides 63 <150 mg/dL   HDL 54 >40 mg/dL   Total CHOL/HDL Ratio 3.2 RATIO   VLDL 13 0 - 40 mg/dL   LDL Cholesterol 107 (H) 0 - 99 mg/dL    Comment:        Total Cholesterol/HDL:CHD Risk Coronary Heart Disease  Risk Table                     Men   Women  1/2 Average Risk   3.4   3.3  Average Risk       5.0   4.4  2 X Average Risk   9.6   7.1  3 X Average Risk  23.4   11.0        Use the calculated Patient Ratio above and the CHD Risk Table to determine the patient's CHD Risk.        ATP III CLASSIFICATION (LDL):  <100     mg/dL   Optimal  100-129  mg/dL   Near or Above                    Optimal  130-159  mg/dL   Borderline  160-189  mg/dL   High  >190     mg/dL   Very High Performed at [facility] [address] [place], [place] [address]   Hemoglobin A1c     Status: None   Collection Time: 05/14/19 11:30 AM  Result Value Ref Range   Hgb A1c MFr Bld 5.2 4.8 - 5.6 %    Comment: (NOTE) Pre diabetes:          5.7%-6.4% Diabetes:               >6.4% Glycemic control for   <7.0% adults with diabetes    Mean Plasma Glucose 102.54 mg/dL    Comment: Performed at [facility] [address] [place], [place] [address]  Brain natriuretic peptide     Status: Abnormal   Collection Time: 05/14/19 11:30 AM  Result Value Ref Range   B Natriuretic Peptide 185.7 (H) 0.0 - 100.0 pg/mL    Comment: Performed at [facility] [address], [place], [place] [address]  I-stat chem 8, ED     Status: Abnormal   Collection Time: 05/14/19 11:32 AM  Result Value Ref Range   Sodium 138 135 - 145 mmol/L   Potassium 4.2 3.5 - 5.1 mmol/L   Chloride 98 98 - 111 mmol/L   BUN 21 8 - 23 mg/dL   Creatinine, Ser 1.00 0.44 - 1.00 mg/dL   Glucose, Bld 143 (H) 70 - 99 mg/dL   Calcium, Ion 1.21 1.15 - 1.40 mmol/L   TCO2 32 22 - 32 mmol/L   Hemoglobin 11.9 (L) 12.0 - 15.0 g/dL   HCT 35.0 (L) 36.0 - 46.0 %  SARS Coronavirus 2 [facility] order, Performed in [facility] hospital lab) Nasopharyngeal Urine, Random     Status: None   Collection Time: 05/14/19 11:50 AM   Specimen: Urine, Random; Nasopharyngeal  Result Value Ref Range   SARS Coronavirus 2 NEGATIVE NEGATIVE    Comment: (NOTE) If result is NEGATIVE SARS-CoV-2 target nucleic acids are NOT DETECTED. The SARS-CoV-2 RNA is generally detectable in upper and lower  respiratory specimens during the acute phase of infection. The lowest  concentration of SARS-CoV-2 viral copies this assay can detect is 250  copies / mL. A negative result does not preclude SARS-CoV-2 infection  and should not be used as the sole basis for treatment or other  patient management decisions.  A negative result may occur with  improper specimen collection / handling, submission of specimen other  than nasopharyngeal swab, presence of viral mutation(s) within the  areas targeted by this assay, and  inadequate number of viral copies  (<250 copies / mL). A negative result must be combined with clinical  observations,  patient history, and epidemiological information. If result is POSITIVE SARS-CoV-2 target nucleic acids are DETECTED. The SARS-CoV-2 RNA is generally detectable in upper and lower  respiratory specimens dur ing the acute phase of infection.  Positive  results are indicative of active infection with SARS-CoV-2.  Clinical  correlation with patient history and other diagnostic information is  necessary to determine patient infection status.  Positive results do  not rule out bacterial infection or co-infection with other viruses. If result is PRESUMPTIVE POSTIVE SARS-CoV-2 nucleic acids MAY BE PRESENT.   A presumptive positive result was obtained on the submitted specimen  and confirmed on repeat testing.  While 2019 novel coronavirus  (SARS-CoV-2) nucleic acids may be present in the submitted sample  additional confirmatory testing may be necessary for epidemiological  and / or clinical management purposes  to differentiate between  SARS-CoV-2 and other Sarbecovirus currently known to infect humans.  If clinically indicated additional testing with an alternate test  methodology [id number]) is advised. The SARS-CoV-2 RNA is generally  detectable in upper and lower respiratory sp ecimens during the acute  phase of infection. The expected result is Negative. Fact Sheet for Patients:  StrictlyIdeas.no Fact Sheet for Healthcare Providers: BankingDealers.co.za This test is not yet approved or cleared by the [place] FDA and has been authorized for detection and/or diagnosis of SARS-CoV-2 by FDA under an Emergency Use Authorization (EUA).  This EUA will remain in effect (meaning this test can be used) for the duration of the COVID-19 declaration under Section 564(b)(1) of the Act, 21 U.S.C. section 360bbb-3(b)(1), unless the authorization is terminated or revoked sooner. Performed at [facility], [place] [address], [place],  [place] [address]   Urine rapid drug screen (hosp performed)     Status: Abnormal   Collection Time: 05/14/19  1:31 PM  Result Value Ref Range   Opiates POSITIVE (A) NONE DETECTED   Cocaine NONE DETECTED NONE DETECTED   Benzodiazepines NONE DETECTED NONE DETECTED   Amphetamines NONE DETECTED NONE DETECTED   Tetrahydrocannabinol NONE DETECTED NONE DETECTED   Barbiturates NONE DETECTED NONE DETECTED    Comment: (NOTE) DRUG SCREEN FOR MEDICAL PURPOSES ONLY.  IF CONFIRMATION IS NEEDED FOR ANY PURPOSE, NOTIFY LAB WITHIN 5 DAYS. LOWEST DETECTABLE LIMITS FOR URINE DRUG SCREEN Drug Class                     Cutoff (ng/mL) Amphetamine and metabolites    1000 Barbiturate and metabolites    200 Benzodiazepine                 211 Tricyclics and metabolites     300 Opiates and metabolites        300 Cocaine and metabolites        300 THC                            50 Performed at [facility], [place] [address], [place], [place] [address]   Urinalysis, Routine w reflex microscopic     Status: Abnormal   Collection Time: 05/14/19  1:31 PM  Result Value Ref Range   Color, Urine COLORLESS (A) YELLOW   APPearance CLEAR CLEAR   Specific Gravity, Urine 1.018 1.005 - 1.030   pH 5.0 5.0 - 8.0   Glucose, UA NEGATIVE NEGATIVE  mg/dL   Hgb urine dipstick NEGATIVE NEGATIVE   Bilirubin Urine NEGATIVE NEGATIVE   Ketones, ur NEGATIVE NEGATIVE mg/dL   Protein, ur NEGATIVE NEGATIVE mg/dL   Nitrite NEGATIVE NEGATIVE   Leukocytes,Ua NEGATIVE NEGATIVE    Comment: Performed at [facility] [address], [place], [place] [address]  Hemoglobin A1c     Status: None   Collection Time: 05/15/19  2:08 AM  Result Value Ref Range   Hgb A1c MFr Bld 5.2 4.8 - 5.6 %    Comment: (NOTE) Pre diabetes:          5.7%-6.4% Diabetes:              >6.4% Glycemic control for   <7.0% adults with diabetes    Mean Plasma Glucose 102.54 mg/dL    Comment: Performed at [facility] [address] [place] [address]  [place], [place] [address]  Lipid panel     Status: Abnormal   Collection Time: 05/15/19  2:08 AM  Result Value Ref Range   Cholesterol 174 0 - 200 mg/dL   Triglycerides 51 <150 mg/dL   HDL 55 >40 mg/dL   Total CHOL/HDL Ratio 3.2 RATIO   VLDL 10 0 - 40 mg/dL   LDL Cholesterol 109 (H) 0 - 99 mg/dL    Comment:        Total Cholesterol/HDL:CHD Risk Coronary Heart Disease Risk Table                     Men   Women  1/2 Average Risk   3.4   3.3  Average Risk       5.0   4.4  2 X Average Risk   9.6   7.1  3 X Average Risk  23.4   11.0        Use the calculated Patient Ratio above and the CHD Risk Table to determine the patient's CHD Risk.        ATP III CLASSIFICATION (LDL):  <100     mg/dL   Optimal  100-129  mg/dL   Near or Above                    Optimal  130-159  mg/dL   Borderline  160-189  mg/dL   High  >190     mg/dL   Very High Performed at [facility] [address] [place], [place] [address]    Ct Angio Chest Pe W And/or Wo Contrast  Result Date: 05/14/2019 CLINICAL DATA:  Short of breath.  Hip replacement. EXAM: CT ANGIOGRAPHY CHEST WITH CONTRAST TECHNIQUE: Multidetector CT imaging of the chest was performed using the standard protocol during bolus administration of intravenous contrast. Multiplanar CT image reconstructions and MIPs were obtained to evaluate the vascular anatomy. CONTRAST:  60mL OMNIPAQUE IOHEXOL 350 MG/ML SOLN COMPARISON:  CT 10/27/2018 FINDINGS: Cardiovascular: No filling defects within the pulmonary arteries to suggest acute pulmonary embolism. No acute findings of the aorta or great vessels. No pericardial fluid. Atelectasis in the medial RIGHT lower lobe associated large hiatal hernia which does limit evaluation of the segment. Mediastinum/Nodes: No axillary or supraclavicular adenopathy. No mediastinal hilar adenopathy. Lungs/Pleura: No pulmonary infarction. Atelectasis in the medial RIGHT lower lobe associated with the large hiatal hernia. Upper  Abdomen: Large hiatal hernia extending in the medial RIGHT hemithorax. Musculoskeletal: No aggressive osseous lesion. Review of the MIP images confirms the above findings. IMPRESSION: 1. No evidence acute pulmonary embolism. 2.  No acute pulmonary parenchymal findings. 3. Large hiatal hernia with associated RIGHT lower lobe atelectasis. Electronically Signed   By: Suzy Bouchard M.D.   On: 05/14/2019 12:38   Mr Angio Head Wo Contrast  Result Date: 05/14/2019 CLINICAL DATA:  Stroke follow-up.  Right-sided weakness. EXAM: MRI HEAD WITHOUT CONTRAST MRA HEAD WITHOUT CONTRAST TECHNIQUE: Multiplanar, multiecho pulse sequences of the brain and surrounding structures were obtained without intravenous contrast. Angiographic images of the head were obtained using MRA technique without contrast. COMPARISON:  Head CT 05/14/2019 FINDINGS: MRI HEAD FINDINGS Brain: There is a small acute cortical infarct posteriorly in the left frontal lobe corresponding to the low-density on CT. Mild cerebral atrophy is within normal limits for age. T2 hyperintensities in the cerebral white matter and pons are nonspecific but compatible with mild chronic small vessel ischemic disease. There are subcentimeter chronic infarcts in the cerebellum. Vascular: Major intracranial vascular flow voids are preserved. Skull and upper cervical spine: Unremarkable bone marrow signal. Disc degeneration at C3-4. Sinuses/Orbits: Bilateral cataract extraction. Mild right sphenoid sinus mucosal thickening. Clear mastoid air cells. Other: None. MRA HEAD FINDINGS The study is mildly to moderately motion degraded. The visualized distal vertebral arteries are widely patent to the basilar with the right being strongly dominant. Patent right PICA, left AICA, and bilateral SCA origins are visualized. The basilar artery is widely patent. There are small posterior communicating arteries bilaterally. Both PCAs are patent without evidence of significant proximal  stenosis. The internal carotid arteries are patent from skull base to carotid termini. There is moderate motion artifact through the carotid siphons without definite flow limiting stenosis. ACAs and MCAs are patent with mild branch vessel irregularity but no evidence of proximal branch occlusion or flow limiting proximal stenosis. No aneurysm is identified. IMPRESSION: 1. Small acute posterior left frontal infarct. 2. Mild chronic small vessel ischemic disease. 3. Motion degraded head MRA without evidence of large vessel occlusion or flow limiting proximal stenosis. Electronically Signed   By: Logan Bores M.D.   On: 05/14/2019 18:21   Mr Angiogram Neck W Or Wo Contrast  Result Date: 05/15/2019 CLINICAL DATA:  Follow-up examination for acute stroke. EXAM: MRA NECK WITHOUT AND WITH CONTRAST TECHNIQUE: Multiplanar and multiecho pulse sequences of the neck were obtained without and with intravenous contrast. Angiographic images of the neck were obtained using MRA technique without and with intravenous contrast. CONTRAST:  10 cc of Gadavist. COMPARISON:  Prior MRI and MRA of the head from 04/13/2019. FINDINGS: Source images reviewed. Visualized aortic arch of normal caliber with normal branch pattern. No hemodynamically significant stenosis seen about the origin of the great vessels. Visualized subclavian arteries widely patent. Right common carotid artery widely patent from its origin to the bifurcation without stenosis. Mild atherosclerotic change about the right bifurcation with associated short-segment stenosis of up to approximately 50% by NASCET criteria. Right ICA widely patent distally to the circle-of-Willis without stenosis, dissection, or occlusion. Left CCA tortuous but widely patent from its origin to the bifurcation without stenosis. Mild atheromatous irregularity about the left bifurcation/proximal left ICA without hemodynamically significant stenosis. Left ICA tortuous but widely patent distally to  the circle-of-Willis without stenosis, dissection, or occlusion. Left vertebral artery diffusely hypoplastic and arises separately from the aortic arch. Right vertebral artery arises from the right subclavian artery. Vertebral arteries patent within the neck without stenosis, dissection, or occlusion. IMPRESSION: 1. Short-segment atheromatous stenosis of up to approximately 50% by NASCET criteria at the origin of the right ICA. 2. No hemodynamically significant  or critical flow limiting stenosis within the left carotid artery system. 3. Wide patency of both vertebral arteries within the neck. Right vertebral artery dominant. 4. Diffuse tortuosity of the major arterial vasculature of the neck, suggesting chronic underlying hypertension. Electronically Signed   By: Jeannine Boga M.D.   On: 05/15/2019 04:16   Mr Brain Wo Contrast  Result Date: 05/14/2019 CLINICAL DATA:  Stroke follow-up.  Right-sided weakness. EXAM: MRI HEAD WITHOUT CONTRAST MRA HEAD WITHOUT CONTRAST TECHNIQUE: Multiplanar, multiecho pulse sequences of the brain and surrounding structures were obtained without intravenous contrast. Angiographic images of the head were obtained using MRA technique without contrast. COMPARISON:  Head CT 05/14/2019 FINDINGS: MRI HEAD FINDINGS Brain: There is a small acute cortical infarct posteriorly in the left frontal lobe corresponding to the low-density on CT. Mild cerebral atrophy is within normal limits for age. T2 hyperintensities in the cerebral white matter and pons are nonspecific but compatible with mild chronic small vessel ischemic disease. There are subcentimeter chronic infarcts in the cerebellum. Vascular: Major intracranial vascular flow voids are preserved. Skull and upper cervical spine: Unremarkable bone marrow signal. Disc degeneration at C3-4. Sinuses/Orbits: Bilateral cataract extraction. Mild right sphenoid sinus mucosal thickening. Clear mastoid air cells. Other: None. MRA HEAD FINDINGS  The study is mildly to moderately motion degraded. The visualized distal vertebral arteries are widely patent to the basilar with the right being strongly dominant. Patent right PICA, left AICA, and bilateral SCA origins are visualized. The basilar artery is widely patent. There are small posterior communicating arteries bilaterally. Both PCAs are patent without evidence of significant proximal stenosis. The internal carotid arteries are patent from skull base to carotid termini. There is moderate motion artifact through the carotid siphons without definite flow limiting stenosis. ACAs and MCAs are patent with mild branch vessel irregularity but no evidence of proximal branch occlusion or flow limiting proximal stenosis. No aneurysm is identified. IMPRESSION: 1. Small acute posterior left frontal infarct. 2. Mild chronic small vessel ischemic disease. 3. Motion degraded head MRA without evidence of large vessel occlusion or flow limiting proximal stenosis. Electronically Signed   By: Logan Bores M.D.   On: 05/14/2019 18:21   Ct Head Code Stroke Wo Contrast  Result Date: 05/14/2019 CLINICAL DATA:  Code stroke.  Right facial droop EXAM: CT HEAD WITHOUT CONTRAST TECHNIQUE: Contiguous axial images were obtained from the base of the skull through the vertex without intravenous contrast. COMPARISON:  10/19/2018 head CT report, images not available FINDINGS: Brain: Small area of cortex blurring in the posterior left frontal lobe. Mild chronic small vessel ischemic change in the cerebral white matter. No hemorrhage, hydrocephalus, or collection. Mild for age volume loss. Vascular: No hyperdense vessel. Skull: Negative Sinuses/Orbits: Negative Other: These results were communicated to Dr. Leonie Man at 11:50 amon 8/16/2020by text page via the [facility] messaging system. ASPECTS [facility] Stroke Program Early CT Score) - Ganglionic level infarction (caudate, lentiform nuclei, internal capsule, insula, M1-M3 cortex): 7 -  Supraganglionic infarction (M4-M6 cortex): 2 Total score (0-10 with 10 being normal): 9 IMPRESSION: 1. Suspect small left posterior frontal cortex infarct. ASPECTS is 9. 2. No acute hemorrhage. Electronically Signed   By: Monte Fantasia M.D.   On: 05/14/2019 11:52    Pending Labs Unresulted Labs (From admission, onward)   None      Vitals/Pain Today's Vitals   05/15/19 0630 05/15/19 0700 05/15/19 0730 05/15/19 0732  BP: 133/71 (!) 162/66 139/66   Pulse: 71 70 70   Resp: 19 20 (!)  22   Temp:      TempSrc:      SpO2: 96% 97% 96%   Weight:      Height:      PainSc:    Asleep    Isolation Precautions No active isolations  Medications Medications  acetaminophen (TYLENOL) tablet 650 mg (has no administration in time range)  HYDROcodone-acetaminophen (NORCO/VICODIN) 5-325 MG per tablet 1-2 tablet (2 tablets Oral Given 05/15/19 0021)  allopurinol (ZYLOPRIM) tablet 300 mg (has no administration in time range)  furosemide (LASIX) tablet 40 mg (has no administration in time range)  pravastatin (PRAVACHOL) tablet 10 mg (has no administration in time range)  citalopram (CELEXA) tablet 10 mg (has no administration in time range)  citalopram (CELEXA) tablet 20 mg (has no administration in time range)  zolpidem (AMBIEN) tablet 10 mg (10 mg Oral Given 05/15/19 0021)  levothyroxine (SYNTHROID) tablet 125 mcg (125 mcg Oral Given 05/15/19 6314)  calcium carbonate (TUMS - dosed in mg elemental calcium) chewable tablet 200 mg of elemental calcium (has no administration in time range)  docusate sodium (COLACE) capsule 100 mg (100 mg Oral Given 05/15/19 0021)  famotidine (PEPCID) tablet 40 mg (has no administration in time range)  pantoprazole (PROTONIX) EC tablet 40 mg (has no administration in time range)  polyethylene glycol (MIRALAX / GLYCOLAX) packet 17 g (has no administration in time range)  senna-docusate (Senokot-S) tablet 1 tablet (has no administration in time range)  fesoterodine (TOVIAZ)  tablet 4 mg (has no administration in time range)  iron polysaccharides (NIFEREX) capsule 150 mg (has no administration in time range)  vitamin B-12 (CYANOCOBALAMIN) tablet 1,000 mcg (has no administration in time range)  Melatonin TABS 3 mg (3 mg Oral Not Given 05/15/19 0156)  baclofen (LIORESAL) tablet 10 mg (has no administration in time range)  loratadine (CLARITIN) tablet 10 mg (has no administration in time range)   stroke: mapping our early stages of recovery book (has no administration in time range)  acetaminophen (TYLENOL) tablet 650 mg (has no administration in time range)    Or  acetaminophen (TYLENOL) solution 650 mg (has no administration in time range)    Or  acetaminophen (TYLENOL) suppository 650 mg (has no administration in time range)  senna-docusate (Senokot-S) tablet 1 tablet (has no administration in time range)  enoxaparin (LOVENOX) injection 40 mg (has no administration in time range)  clopidogrel (PLAVIX) tablet 75 mg (has no administration in time range)  aspirin EC tablet 325 mg (has no administration in time range)  iohexol (OMNIPAQUE) 350 MG/ML injection 80 mL (80 mLs Intravenous Contrast Given 05/14/19 1148)  gadobutrol (GADAVIST) 1 MMOL/ML injection 10 mL (10 mLs Intravenous Contrast Given 05/15/19 0338)    Mobility walks with person assist High fall risk   Focused Assessments Neuro Assessment Handoff:  Swallow screen pass? Yes    NIH Stroke Scale ( + Modified Stroke Scale Criteria)  Interval: Initial Level of Consciousness (1a.)   : Alert, keenly responsive LOC Questions (1b. )   +: Answers both questions correctly LOC Commands (1c. )   + : Performs both tasks correctly Best Gaze (2. )  +: Normal Visual (3. )  +: No visual loss Facial Palsy (4. )    : Minor paralysis Motor Arm, Left (5a. )   +: No drift Motor Arm, Right (5b. )   +: No drift Motor Leg, Left (6a. )   +: No drift Motor Leg, Right (6b. )   +: No drift Limb Ataxia (7. ):  Absent Sensory (8. )   +: Normal, no sensory loss Best Language (9. )   +: No aphasia Dysarthria (10. ): Normal Extinction/Inattention (11.)   +: No Abnormality Modified SS Total  +: 0 Complete NIHSS TOTAL: 1 Last date known well: 05/14/19 Last time known well: 0800 Neuro Assessment: Within Defined Limits Neuro Checks:   Initial (05/14/19 1125)  Last Documented NIHSS Modified Score: 0 (05/15/19 0700) Has TPA been given? No If patient is a Neuro Trauma and patient is going to OR before floor call report to [place] nurse: [phone] or [phone]     R Recommendations: See Admitting Provider Note  Report given to:   Additional Notes:

## 2019-05-15 NOTE — Evaluation (Signed)
Occupational Therapy Evaluation Patient Details Name: Melinda Kerr MRN: [id number] DOB: [birth year] Today's Date: 05/15/2019    History of Present Illness Pt is an 84 yo female s/p R sided weakness, dropping coffee. MRI revealed: small acute posterior L infarct frontal. PMHx: DMT2, OSA, HTN, HLD, DVT, CHF.   Clinical Impression   Pt PTA: Pt receiving skilled services in SNF setting s/p R femur fx with IM nailing. Pt was maximove for staff and +2 for therapists to transfer pt to w/c. Pt had been working on standing tolerance and mobility in parallel bars. Pt attempted transfer with stedy+1, pt's BLEs shaking too much to perform task. Pt requires +2 for transfer at this time to attempt stedy with +2 next time. Pt performing ADL tasks requiring increased assist set-upA for UB ADL and LB ADL with maxA to [place]. Pt would greatly benefit from continued OT skilled services for ADL, mobility and safety in SNF setting. OT following acutely.    Follow Up Recommendations  SNF;Supervision/Assistance - 24 hour    Equipment Recommendations  Other (comment)(to be determined)    Recommendations for Other Services       Precautions / Restrictions Precautions Precautions: None Restrictions Weight Bearing Restrictions: No      Mobility Bed Mobility Overal bed mobility: Needs Assistance Bed Mobility: Supine to Sit     Supine to sit: Max assist     General bed mobility comments: Pt requiring assist for LB maneuvering to EOB and trunk elevation with bed rail.  Transfers                 General transfer comment: attempted transfer with stedy+1, pt's BLEs shaking too much to perform task.    Balance Overall balance assessment: Needs assistance   Sitting balance-Leahy Scale: Poor Sitting balance - Comments: pt requires assist due to leaning backwards or leaning to side.                                   ADL either performed or assessed with clinical judgement   ADL  Overall ADL's : Needs assistance/impaired Eating/Feeding: Set up;Bed level   Grooming: Set up;Sitting;Bed level   Upper Body Bathing: Minimal assistance;Sitting   Lower Body Bathing: Maximal assistance;Total assistance;Sitting/lateral leans;Bed level   Upper Body Dressing : Minimal assistance;Sitting   Lower Body Dressing: Maximal assistance;Sitting/lateral leans;Sit to/from stand   Toilet Transfer: Total assistance Toilet Transfer Details (indicate cue type and reason): Pt attempting transfer with stedy with maxA+1, but pt;s BLEs shaking so pt unable to safely stand with confidence with stedy and requires increased time. Toileting- Clothing Manipulation and Hygiene: Maximal assistance;+2 for physical assistance;+2 for safety/equipment;Bed level       Functional mobility during ADLs: Total assistance General ADL Comments: Pt requiring increased assist for LB ADL as pt with increased weakness and poor activity tolerance.     Vision Baseline Vision/History: Wears glasses Wears Glasses: At all times Patient Visual Report: No change from baseline Vision Assessment?: No apparent visual deficits     Perception     Praxis      Pertinent Vitals/Pain Pain Assessment: Faces Faces Pain Scale: Hurts little more Pain Location: R leg Pain Descriptors / Indicators: Discomfort Pain Intervention(s): Limited activity within patient's tolerance     Hand Dominance Right   Extremity/Trunk Assessment Upper Extremity Assessment Upper Extremity Assessment: Generalized weakness;RUE deficits/detail;LUE deficits/detail RUE Deficits / Details: 3-/5 MM grade LUE Deficits /  Details: 3-/5 MM grade   Lower Extremity Assessment Lower Extremity Assessment: Defer to PT evaluation;Generalized weakness   Cervical / Trunk Assessment Cervical / Trunk Assessment: Other exceptions Cervical / Trunk Exceptions: large body habitus   Communication Communication Communication: No difficulties    Cognition Arousal/Alertness: Awake/alert Behavior During Therapy: WFL for tasks assessed/performed Overall Cognitive Status: Within Functional Limits for tasks assessed                                     General Comments       Exercises     Shoulder Instructions      Home Living Family/patient expects to be discharged to:: Skilled nursing facility                                 Additional Comments: Plans for to DC to SNF      Prior Functioning/Environment Level of Independence: Needs assistance  Gait / Transfers Assistance Needed: Previously hoyer lift with staff or +2 therapists OOB to w/c. ADL's / Homemaking Assistance Needed: Pt requiring assist for ADL. Nearly [place] for LB ADL. UB ADL with minA.   Comments: paying 9k/month for SNF.        OT Problem List: Decreased strength;Decreased safety awareness;Impaired balance (sitting and/or standing);Decreased activity tolerance;Pain;Decreased coordination      OT Treatment/Interventions: Self-care/ADL training;Therapeutic exercise;Neuromuscular education;Energy conservation;Therapeutic activities;Patient/family education;Balance training    OT Goals(Current goals can be found in the care plan section) Acute Rehab OT Goals Patient Stated Goal: to go back to SNF OT Goal Formulation: With patient Time For Goal Achievement: 05/29/19 Potential to Achieve Goals: Good ADL Goals Pt Will Perform Grooming: with modified independence;sitting Pt Will Perform Lower Body Dressing: with min assist;with adaptive equipment;sitting/lateral leans Pt Will Transfer to Toilet: with min assist;with +2 assist;squat pivot transfer;bedside commode Pt Will Perform Toileting - Clothing Manipulation and hygiene: with mod assist;with 2+ total assist;sit to/from stand Additional ADL Goal #1: Pt will increase to standing tolerance x2 mins in prep for ADL.  OT Frequency: Min 2X/week   Barriers to D/C:             Co-evaluation              AM-PAC OT "6 Clicks" Daily Activity     Outcome Measure Help from another person eating meals?: None Help from another person taking care of personal grooming?: A Little Help from another person toileting, which includes using toliet, bedpan, or urinal?: A Lot Help from another person bathing (including washing, rinsing, drying)?: A Lot Help from another person to put on and taking off regular upper body clothing?: A Little Help from another person to put on and taking off regular lower body clothing?: A Lot 6 Click Score: 16   End of Session Equipment Utilized During Treatment: Gait belt Nurse Communication: Mobility status  Activity Tolerance: Patient tolerated treatment well;Patient limited by fatigue;Patient limited by lethargy Patient left: in bed;with call bell/phone within reach;with bed alarm set  OT Visit Diagnosis: Unsteadiness on feet (R26.81);Muscle weakness (generalized) (M62.81)                Time: 3557-3220 OT Time Calculation (min): 30 min Charges:  OT General Charges $OT Visit: 1 Visit OT Evaluation $OT Eval Moderate Complexity: 1 Mod OT Treatments $Self Care/Home Management : 8-22 mins  Ebony Hail Harold Hedge) Marsa Aris  OTR/L Acute Rehabilitation Services Pager: [phone] Office: [place] 05/15/2019, 4:19 PM

## 2019-05-15 NOTE — Progress Notes (Signed)
Patient arrived from ED. A&O x4. States no pain. Skin intact, ecchymosis on abdomen. Call bell within reach. Bed in lowest position. Nurse will continue to monitor. [place]

## 2019-05-15 NOTE — Progress Notes (Addendum)
PROGRESS NOTE    Melinda Kerr  QMV:[id number] DOB: 1934-10-22 DOA: 05/14/2019 PCP: Maury Dus, MD    Brief Narrative:  Melinda Kerr  is a 84 y.o. female, SNF resident with past medical history significant for uterine cancer, OSA, congestive heart failure, DVT, morbid obesity, hypertension and hyperlipidemia who presented originally with shortness of breath. She also reported right-sided weakness, sudden that started this a.m.  Her right hand became so weak and she dropped her coffee.  Patient had a recent hip fracture on 04/08/2019, repaired.    ER work-up was negative for acute pulmonary embolism or edema by CT of chest, however CT of the head was concerning for stroke.  Physical examination showed normal motor power and neurology was consulted.   Assessment & Plan:   Principal Problem:   Acute CVA (cerebrovascular accident) [facility]) Active Problems:   Morbid obesity with BMI of 45.0-49.9, adult (HCC)   Hypothyroidism   Dyslipidemia   Depression   Essential hypertension   TIA (transient ischemic attack)   Acute left frontal CVA, suspect embolic Patient presenting from [facility] with difficulty with speech and associated right hand weakness at approximately 0630 on 05/13/2021. TPA was not administered secondary to presentation outside of window and low NIH score on arrival.  MR brain with small acute posterior left frontal infarct.  Lower extremity Dopplers negative for DVT.  Echocardiogram with EF 60 to 65%, impaired relaxation, LA mildly dilated.  MRA neck with up to 50% stenosis origin right ICA ; no hemodynamically significant/critical flow stenosis  Left carotid and wide patency of bilateral vertebral arteries.  Carotid Dopplers with right and left  ICA 1-39% stenosis.  --Neurology following, appreciate assistance --Hemoglobin A1c 5.2 --Total cholesterol 174, HDL 55, LDL 109, triglycerides 51 --Continue DAPT x 3 weeks with ASA and plavix; followed by aspirin alone --Continue  pravastatin --Pending PT/OT/SLP evaluations --Neurology recommends a 30-day cardiac event monitor, cardiology consulted for placement --Anticipate return to [facility] when work-up complete with therapy evaluation and neurology sign off  Dyspnea: Resolved Patient presented with dyspnea.  CT angiogram chest negative for PE, but notable for large hiatal hernia.  Currently oxygenating well on room air.  Depression: Continue Celexa 10 mg p.o. daily  Chronic diastolic congestive heart failure, compensated TTE 8/17 with EF 60-65%, impaired relaxation, LA mildly dilated. --Continue furosemide 40 mg p.o. daily --Monitor strict I's and O's and daily weights  Hypothyroidism: Continue levothyroxine 125 mcg p.o. daily  Allergic rhinitis: Continue loratadine 10 mg p.o. daily  GERD: Continue Protonix 40 mg p.o. twice daily  Hyperlipidemia: Total cholesterol 174, HDL 55, LDL 109, triglycerides 51 --Continue pravastatin   DVT prophylaxis: Lovenox Code Status: Full code Family Communication: None Disposition Plan: Continue inpatient, pending PT/OT evaluation, awaiting further recommendations from neurology service, Likely return to [facility] when medically ready.   Consultants:   Neurology  Procedures:  Transthoracic echocardiogram 05/15/2019: IMPRESSIONS    1. The left ventricle has normal systolic function with an ejection fraction of 60-65%. The cavity size was normal. Left ventricular diastolic Doppler parameters are consistent with impaired relaxation. No evidence of left ventricular regional wall  motion abnormalities.  2. The right ventricle has normal systolic function. The cavity was normal. There is no increase in right ventricular wall thickness.  3. Left atrial size was mildly dilated.  4. Mild aortic annular calcification noted.  5. There is moderate mitral annular calcification present.  6. The aorta is normal unless otherwise noted.  7. Poor acoustic windows for  bubble  study. Unable to determine if there is bubble cross over.  Carotid Dopplers 05/15/2019: Summary: Right Carotid: Velocities in the right ICA are consistent with a 1-39% stenosis.  Left Carotid: Velocities in the left ICA are consistent with a 1-39% stenosis.  Vertebrals:  Bilateral vertebral arteries demonstrate antegrade flow. Subclavians: Normal flow hemodynamics were seen in bilateral subclavian              arteries.  Bilateral lower extremity venous duplex ultrasound 05/15/2019: Summary: Right: There is no evidence of deep vein thrombosis in the lower extremity. No cystic structure found in the popliteal fossa. Left: There is no evidence of deep vein thrombosis in the lower extremity. No cystic structure found in the popliteal fossa.   Antimicrobials:  none   Subjective: Patient seen and examined at bedside, resting comfortably.  Currently having vascular studies performed.  Feels her speech, and weakness are now back to her normal baseline.  Awaiting therapy evaluation.  No other complaints or concerns at this time.  Denies headache, no fever/chills/night sweats, no nausea vomiting/diarrhea, no chest pain, no shortness of breath, no palpitations, no abdominal pain, no issues with bowel/bladder function, no paresthesias.  No acute events overnight per nursing staff.  Objective: Vitals:   05/15/19 0700 05/15/19 0730 05/15/19 0800 05/15/19 0900  BP: (!) 162/66 139/66 (!) 159/76 104/66  Pulse: 70 70 69 72  Resp: 20 (!) 22 20 18   Temp:    98.5 F (36.9 C)  TempSrc:    Oral  SpO2: 97% 96% 97% 99%  Weight:      Height:       No intake or output data in the 24 hours ending 05/15/19 1503 Filed Weights   05/14/19 1031  Weight: 108.9 kg    Examination:  General exam: Appears calm and comfortable  Respiratory system: Clear to auscultation. Respiratory effort normal. Cardiovascular system: S1 & S2 heard, RRR. No JVD, murmurs, rubs, gallops or clicks. No pedal  edema. Gastrointestinal system: Abdomen is nondistended, soft and nontender. No organomegaly or masses felt. Normal bowel sounds heard. Central nervous system: Alert and oriented. No focal neurological deficits. Extremities: Symmetric 5 x 5 power. Skin: No rashes, lesions or ulcers Psychiatry: Judgement and insight appear normal. Mood & affect appropriate.     Data Reviewed: I have personally reviewed following labs and imaging studies  CBC: Recent Labs  Lab 05/14/19 1130 05/14/19 1132  WBC 6.4  --   NEUTROABS 4.4  --   HGB 9.5* 11.9*  HCT 34.4* 35.0*  MCV 85.8  --   PLT 340  --    Basic Metabolic Panel: Recent Labs  Lab 05/14/19 1130 05/14/19 1132  NA 136 138  K 4.2 4.2  CL 98 98  CO2 27  --   GLUCOSE 154* 143*  BUN 19 21  CREATININE 0.99 1.00  CALCIUM 9.2  --    GFR: Estimated Creatinine Clearance: 48.5 mL/min (by C-G formula based on SCr of 1 mg/dL). Liver Function Tests: Recent Labs  Lab 05/14/19 1130  AST 21  ALT 13  ALKPHOS 112  BILITOT 0.3  PROT 5.5*  ALBUMIN 2.9*   No results for input(s): LIPASE, AMYLASE in the last 168 hours. No results for input(s): AMMONIA in the last 168 hours. Coagulation Profile: Recent Labs  Lab 05/14/19 1130  INR 1.3*   Cardiac Enzymes: No results for input(s): CKTOTAL, CKMB, CKMBINDEX, TROPONINI in the last 168 hours. BNP (last 3 results) No results for input(s): PROBNP in  the last 8760 hours. HbA1C: Recent Labs    05/14/19 1130 05/15/19 0208  HGBA1C 5.2 5.2   CBG: Recent Labs  Lab 05/14/19 1034  GLUCAP 162*   Lipid Profile: Recent Labs    05/14/19 1130 05/15/19 0208  CHOL 174 174  HDL 54 55  LDLCALC 107* 109*  TRIG 63 51  CHOLHDL 3.2 3.2   Thyroid Function Tests: No results for input(s): TSH, T4TOTAL, FREET4, T3FREE, THYROIDAB in the last 72 hours. Anemia Panel: No results for input(s): VITAMINB12, FOLATE, FERRITIN, TIBC, IRON, RETICCTPCT in the last 72 hours. Sepsis Labs: No results for  input(s): PROCALCITON, LATICACIDVEN in the last 168 hours.  Recent Results (from the past 240 hour(s))  SARS Coronavirus 2 [facility] order, Performed in [facility] hospital lab) Nasopharyngeal Urine, Random     Status: None   Collection Time: 05/14/19 11:50 AM   Specimen: Urine, Random; Nasopharyngeal  Result Value Ref Range Status   SARS Coronavirus 2 NEGATIVE NEGATIVE Final    Comment: (NOTE) If result is NEGATIVE SARS-CoV-2 target nucleic acids are NOT DETECTED. The SARS-CoV-2 RNA is generally detectable in upper and lower  respiratory specimens during the acute phase of infection. The lowest  concentration of SARS-CoV-2 viral copies this assay can detect is 250  copies / mL. A negative result does not preclude SARS-CoV-2 infection  and should not be used as the sole basis for treatment or other  patient management decisions.  A negative result may occur with  improper specimen collection / handling, submission of specimen other  than nasopharyngeal swab, presence of viral mutation(s) within the  areas targeted by this assay, and inadequate number of viral copies  (<250 copies / mL). A negative result must be combined with clinical  observations, patient history, and epidemiological information. If result is POSITIVE SARS-CoV-2 target nucleic acids are DETECTED. The SARS-CoV-2 RNA is generally detectable in upper and lower  respiratory specimens dur ing the acute phase of infection.  Positive  results are indicative of active infection with SARS-CoV-2.  Clinical  correlation with patient history and other diagnostic information is  necessary to determine patient infection status.  Positive results do  not rule out bacterial infection or co-infection with other viruses. If result is PRESUMPTIVE POSTIVE SARS-CoV-2 nucleic acids MAY BE PRESENT.   A presumptive positive result was obtained on the submitted specimen  and confirmed on repeat testing.  While 2019 novel coronavirus   (SARS-CoV-2) nucleic acids may be present in the submitted sample  additional confirmatory testing may be necessary for epidemiological  and / or clinical management purposes  to differentiate between  SARS-CoV-2 and other Sarbecovirus currently known to infect humans.  If clinically indicated additional testing with an alternate test  methodology [phone]) is advised. The SARS-CoV-2 RNA is generally  detectable in upper and lower respiratory sp ecimens during the acute  phase of infection. The expected result is Negative. Fact Sheet for Patients:  StrictlyIdeas.no Fact Sheet for Healthcare Providers: BankingDealers.co.za This test is not yet approved or cleared by the [place] FDA and has been authorized for detection and/or diagnosis of SARS-CoV-2 by FDA under an Emergency Use Authorization (EUA).  This EUA will remain in effect (meaning this test can be used) for the duration of the COVID-19 declaration under Section 564(b)(1) of the Act, 21 U.S.C. section 360bbb-3(b)(1), unless the authorization is terminated or revoked sooner. Performed at [facility], [place] [address], [place], [place] [address]  Radiology Studies: Ct Angio Chest Pe W And/or Wo Contrast  Result Date: 05/14/2019 CLINICAL DATA:  Short of breath.  Hip replacement. EXAM: CT ANGIOGRAPHY CHEST WITH CONTRAST TECHNIQUE: Multidetector CT imaging of the chest was performed using the standard protocol during bolus administration of intravenous contrast. Multiplanar CT image reconstructions and MIPs were obtained to evaluate the vascular anatomy. CONTRAST:  50mL OMNIPAQUE IOHEXOL 350 MG/ML SOLN COMPARISON:  CT 10/27/2018 FINDINGS: Cardiovascular: No filling defects within the pulmonary arteries to suggest acute pulmonary embolism. No acute findings of the aorta or great vessels. No pericardial fluid. Atelectasis in the medial RIGHT lower lobe associated large  hiatal hernia which does limit evaluation of the segment. Mediastinum/Nodes: No axillary or supraclavicular adenopathy. No mediastinal hilar adenopathy. Lungs/Pleura: No pulmonary infarction. Atelectasis in the medial RIGHT lower lobe associated with the large hiatal hernia. Upper Abdomen: Large hiatal hernia extending in the medial RIGHT hemithorax. Musculoskeletal: No aggressive osseous lesion. Review of the MIP images confirms the above findings. IMPRESSION: 1. No evidence acute pulmonary embolism. 2. No acute pulmonary parenchymal findings. 3. Large hiatal hernia with associated RIGHT lower lobe atelectasis. Electronically Signed   By: Suzy Bouchard M.D.   On: 05/14/2019 12:38   Mr Angio Head Wo Contrast  Result Date: 05/14/2019 CLINICAL DATA:  Stroke follow-up.  Right-sided weakness. EXAM: MRI HEAD WITHOUT CONTRAST MRA HEAD WITHOUT CONTRAST TECHNIQUE: Multiplanar, multiecho pulse sequences of the brain and surrounding structures were obtained without intravenous contrast. Angiographic images of the head were obtained using MRA technique without contrast. COMPARISON:  Head CT 05/14/2019 FINDINGS: MRI HEAD FINDINGS Brain: There is a small acute cortical infarct posteriorly in the left frontal lobe corresponding to the low-density on CT. Mild cerebral atrophy is within normal limits for age. T2 hyperintensities in the cerebral white matter and pons are nonspecific but compatible with mild chronic small vessel ischemic disease. There are subcentimeter chronic infarcts in the cerebellum. Vascular: Major intracranial vascular flow voids are preserved. Skull and upper cervical spine: Unremarkable bone marrow signal. Disc degeneration at C3-4. Sinuses/Orbits: Bilateral cataract extraction. Mild right sphenoid sinus mucosal thickening. Clear mastoid air cells. Other: None. MRA HEAD FINDINGS The study is mildly to moderately motion degraded. The visualized distal vertebral arteries are widely patent to the basilar  with the right being strongly dominant. Patent right PICA, left AICA, and bilateral SCA origins are visualized. The basilar artery is widely patent. There are small posterior communicating arteries bilaterally. Both PCAs are patent without evidence of significant proximal stenosis. The internal carotid arteries are patent from skull base to carotid termini. There is moderate motion artifact through the carotid siphons without definite flow limiting stenosis. ACAs and MCAs are patent with mild branch vessel irregularity but no evidence of proximal branch occlusion or flow limiting proximal stenosis. No aneurysm is identified. IMPRESSION: 1. Small acute posterior left frontal infarct. 2. Mild chronic small vessel ischemic disease. 3. Motion degraded head MRA without evidence of large vessel occlusion or flow limiting proximal stenosis. Electronically Signed   By: Logan Bores M.D.   On: 05/14/2019 18:21   Mr Angiogram Neck W Or Wo Contrast  Result Date: 05/15/2019 CLINICAL DATA:  Follow-up examination for acute stroke. EXAM: MRA NECK WITHOUT AND WITH CONTRAST TECHNIQUE: Multiplanar and multiecho pulse sequences of the neck were obtained without and with intravenous contrast. Angiographic images of the neck were obtained using MRA technique without and with intravenous contrast. CONTRAST:  10 cc of Gadavist. COMPARISON:  Prior MRI and MRA of the head  from 04/13/2019. FINDINGS: Source images reviewed. Visualized aortic arch of normal caliber with normal branch pattern. No hemodynamically significant stenosis seen about the origin of the great vessels. Visualized subclavian arteries widely patent. Right common carotid artery widely patent from its origin to the bifurcation without stenosis. Mild atherosclerotic change about the right bifurcation with associated short-segment stenosis of up to approximately 50% by NASCET criteria. Right ICA widely patent distally to the circle-of-Willis without stenosis, dissection,  or occlusion. Left CCA tortuous but widely patent from its origin to the bifurcation without stenosis. Mild atheromatous irregularity about the left bifurcation/proximal left ICA without hemodynamically significant stenosis. Left ICA tortuous but widely patent distally to the circle-of-Willis without stenosis, dissection, or occlusion. Left vertebral artery diffusely hypoplastic and arises separately from the aortic arch. Right vertebral artery arises from the right subclavian artery. Vertebral arteries patent within the neck without stenosis, dissection, or occlusion. IMPRESSION: 1. Short-segment atheromatous stenosis of up to approximately 50% by NASCET criteria at the origin of the right ICA. 2. No hemodynamically significant or critical flow limiting stenosis within the left carotid artery system. 3. Wide patency of both vertebral arteries within the neck. Right vertebral artery dominant. 4. Diffuse tortuosity of the major arterial vasculature of the neck, suggesting chronic underlying hypertension. Electronically Signed   By: Jeannine Boga M.D.   On: 05/15/2019 04:16   Mr Brain Wo Contrast  Result Date: 05/14/2019 CLINICAL DATA:  Stroke follow-up.  Right-sided weakness. EXAM: MRI HEAD WITHOUT CONTRAST MRA HEAD WITHOUT CONTRAST TECHNIQUE: Multiplanar, multiecho pulse sequences of the brain and surrounding structures were obtained without intravenous contrast. Angiographic images of the head were obtained using MRA technique without contrast. COMPARISON:  Head CT 05/14/2019 FINDINGS: MRI HEAD FINDINGS Brain: There is a small acute cortical infarct posteriorly in the left frontal lobe corresponding to the low-density on CT. Mild cerebral atrophy is within normal limits for age. T2 hyperintensities in the cerebral white matter and pons are nonspecific but compatible with mild chronic small vessel ischemic disease. There are subcentimeter chronic infarcts in the cerebellum. Vascular: Major intracranial  vascular flow voids are preserved. Skull and upper cervical spine: Unremarkable bone marrow signal. Disc degeneration at C3-4. Sinuses/Orbits: Bilateral cataract extraction. Mild right sphenoid sinus mucosal thickening. Clear mastoid air cells. Other: None. MRA HEAD FINDINGS The study is mildly to moderately motion degraded. The visualized distal vertebral arteries are widely patent to the basilar with the right being strongly dominant. Patent right PICA, left AICA, and bilateral SCA origins are visualized. The basilar artery is widely patent. There are small posterior communicating arteries bilaterally. Both PCAs are patent without evidence of significant proximal stenosis. The internal carotid arteries are patent from skull base to carotid termini. There is moderate motion artifact through the carotid siphons without definite flow limiting stenosis. ACAs and MCAs are patent with mild branch vessel irregularity but no evidence of proximal branch occlusion or flow limiting proximal stenosis. No aneurysm is identified. IMPRESSION: 1. Small acute posterior left frontal infarct. 2. Mild chronic small vessel ischemic disease. 3. Motion degraded head MRA without evidence of large vessel occlusion or flow limiting proximal stenosis. Electronically Signed   By: Logan Bores M.D.   On: 05/14/2019 18:21   Ct Head Code Stroke Wo Contrast  Result Date: 05/14/2019 CLINICAL DATA:  Code stroke.  Right facial droop EXAM: CT HEAD WITHOUT CONTRAST TECHNIQUE: Contiguous axial images were obtained from the base of the skull through the vertex without intravenous contrast. COMPARISON:  10/19/2018 head CT report,  images not available FINDINGS: Brain: Small area of cortex blurring in the posterior left frontal lobe. Mild chronic small vessel ischemic change in the cerebral white matter. No hemorrhage, hydrocephalus, or collection. Mild for age volume loss. Vascular: No hyperdense vessel. Skull: Negative Sinuses/Orbits: Negative  Other: These results were communicated to Dr. Leonie Man at 11:50 amon 8/16/2020by text page via the [facility] messaging system. ASPECTS [facility] Stroke Program Early CT Score) - Ganglionic level infarction (caudate, lentiform nuclei, internal capsule, insula, M1-M3 cortex): 7 - Supraganglionic infarction (M4-M6 cortex): 2 Total score (0-10 with 10 being normal): 9 IMPRESSION: 1. Suspect small left posterior frontal cortex infarct. ASPECTS is 9. 2. No acute hemorrhage. Electronically Signed   By: Monte Fantasia M.D.   On: 05/14/2019 11:52   Vas US Carotid  Result Date: 05/15/2019 Carotid Arterial Duplex Study Indications: CVA, Speech disturbance and Weakness. Performing Technologist: Toma Copier RVS  Examination Guidelines: A complete evaluation includes B-mode imaging, spectral Doppler, color Doppler, and power Doppler as needed of all accessible portions of each vessel. Bilateral testing is considered an integral part of a complete examination. Limited examinations for reoccurring indications may be performed as noted.  Right Carotid Findings: +----------+--------+--------+--------+--------------------+-------------------+             PSV cm/s EDV cm/s Stenosis Describe             Comments             +----------+--------+--------+--------+--------------------+-------------------+  CCA Prox   106      14                                     mild intimal wall                                                                changes              +----------+--------+--------+--------+--------------------+-------------------+  CCA Distal 56       5                                      mild intimal wall                                                                changes              +----------+--------+--------+--------+--------------------+-------------------+  ICA Prox   76       21       1-39%    heterogenous and     mild plaque on the                                          focal                far wall at  the                                                                  bifurcation          +----------+--------+--------+--------+--------------------+-------------------+  ICA Mid    64       15                                                          +----------+--------+--------+--------+--------------------+-------------------+  ICA Distal 41       10                                     tortuous             +----------+--------+--------+--------+--------------------+-------------------+  ECA        107      11                heterogenous         mild plaque          +----------+--------+--------+--------+--------------------+-------------------+ +----------+--------+-------+--------+-------------------+             PSV cm/s EDV cms Describe Arm Pressure (mmHG)  +----------+--------+-------+--------+-------------------+  Subclavian 127                                            +----------+--------+-------+--------+-------------------+ +---------+--------+--+--------+-+  Vertebral PSV cm/s 31 EDV cm/s 9  +---------+--------+--+--------+-+  Left Carotid Findings: +----------+--------+--------+--------+------------+-------------------------+             PSV cm/s EDV cm/s Stenosis Describe     Comments                   +----------+--------+--------+--------+------------+-------------------------+  CCA Prox   81       15                                                        +----------+--------+--------+--------+------------+-------------------------+  CCA Distal 109      19                             mild intimal wall changes  +----------+--------+--------+--------+------------+-------------------------+  ICA Prox   90       19       1-39%    heterogenous minimal palque             +----------+--------+--------+--------+------------+-------------------------+  ICA Mid    68       17                             tortuous                   +----------+--------+--------+--------+------------+-------------------------+  ICA Distal 66       24                             tortuous                   +----------+--------+--------+--------+------------+-------------------------+  ECA        167      12                             mild intimal wall changes  +----------+--------+--------+--------+------------+-------------------------+ +----------+--------+--------+--------+-------------------+  Subclavian PSV cm/s EDV cm/s Describe Arm Pressure (mmHG)  +----------+--------+--------+--------+-------------------+             116      11                                     +----------+--------+--------+--------+-------------------+ +---------+--------+--+--------+-+  Vertebral PSV cm/s 28 EDV cm/s 3  +---------+--------+--+--------+-+  Summary: Right Carotid: Velocities in the right ICA are consistent with a 1-39% stenosis. Left Carotid: Velocities in the left ICA are consistent with a 1-39% stenosis. Vertebrals:  Bilateral vertebral arteries demonstrate antegrade flow. Subclavians: Normal flow hemodynamics were seen in bilateral subclavian              arteries. *See table(s) above for measurements and observations.     Preliminary    Vas [place] Lower Extremity Venous (dvt)  Result Date: 05/15/2019  Lower Venous Study Indications: Stroke.  Limitations: Body habitus. Comparison Study: No previous study available for comparison Performing Technologist: Toma Copier RVS  Examination Guidelines: A complete evaluation includes B-mode imaging, spectral Doppler, color Doppler, and power Doppler as needed of all accessible portions of each vessel. Bilateral testing is considered an integral part of a complete examination. Limited examinations for reoccurring indications may be performed as noted.  +---------+---------------+---------+-----------+----------+-------+  RIGHT     Compressibility Phasicity Spontaneity Properties Summary  +---------+---------------+---------+-----------+----------+-------+  CFV       Full            Yes       Yes                              +---------+---------------+---------+-----------+----------+-------+  SFJ       Full                                                      +---------+---------------+---------+-----------+----------+-------+  FV Prox   Full            Yes       Yes                             +---------+---------------+---------+-----------+----------+-------+  FV Mid    Full                                                      +---------+---------------+---------+-----------+----------+-------+  FV Distal Full  Yes       Yes                             +---------+---------------+---------+-----------+----------+-------+  PFV       Full            Yes       Yes                             +---------+---------------+---------+-----------+----------+-------+  POP       Full            Yes       Yes                             +---------+---------------+---------+-----------+----------+-------+  PTV       Full                                                      +---------+---------------+---------+-----------+----------+-------+  PERO      Full                                                      +---------+---------------+---------+-----------+----------+-------+   +---------+---------------+---------+-----------+----------+-------+  LEFT      Compressibility Phasicity Spontaneity Properties Summary  +---------+---------------+---------+-----------+----------+-------+  CFV       Full            Yes       Yes                             +---------+---------------+---------+-----------+----------+-------+  SFJ       Full                                                      +---------+---------------+---------+-----------+----------+-------+  FV Prox   Full            Yes       Yes                             +---------+---------------+---------+-----------+----------+-------+  FV Mid    Full                                                       +---------+---------------+---------+-----------+----------+-------+  FV Distal Full            Yes       Yes                             +---------+---------------+---------+-----------+----------+-------+  PFV       Full  Yes       Yes                             +---------+---------------+---------+-----------+----------+-------+  POP       Full            Yes       Yes                             +---------+---------------+---------+-----------+----------+-------+  PTV       Full                                                      +---------+---------------+---------+-----------+----------+-------+  PERO      Full                                                      +---------+---------------+---------+-----------+----------+-------+     Summary: Right: There is no evidence of deep vein thrombosis in the lower extremity. No cystic structure found in the popliteal fossa. Left: There is no evidence of deep vein thrombosis in the lower extremity. No cystic structure found in the popliteal fossa.  *See table(s) above for measurements and observations. Electronically signed by Servando Snare MD on 05/15/2019 at 2:17:19 PM.    Final         Scheduled Meds:  allopurinol  300 mg Oral Daily   aspirin EC  325 mg Oral Daily   calcium carbonate  1 tablet Oral Daily   citalopram  10 mg Oral Daily   clopidogrel  75 mg Oral Daily   docusate sodium  100 mg Oral BID   enoxaparin (LOVENOX) injection  40 mg Subcutaneous Daily   fesoterodine  4 mg Oral Daily   furosemide  40 mg Oral Daily   iron polysaccharides  150 mg Oral Daily   [START ON 05/16/2019] levothyroxine  125 mcg Oral QAC breakfast   loratadine  10 mg Oral Daily   Melatonin  3 mg Oral QHS   pantoprazole  40 mg Oral BID AC   [START ON 05/18/2019] pravastatin  10 mg Oral Q Thu   senna-docusate  1 tablet Oral Daily   vitamin B-12  1,000 mcg Oral Daily   zolpidem  10 mg Oral QHS   Continuous Infusions:   LOS: 1 day     Time spent: 38 minutes spent on chart review, personally reviewed all imaging studies, labs, discussion with nursing staff, consultants, updating family and interview/physical exam; more than 50% of that time was spent in counseling and/or coordination of care.    Kavontae Pritchard J [place] DO [facility] Pager [phone] If 7PM-7AM, please contact night-coverage [email or web address] Password [facility] 05/15/2019, 3:03 PM

## 2019-05-15 NOTE — Progress Notes (Signed)
Bilateral lower extremity venous duplex completed. Preliminary results in Chart review CV Proc. [facility], RVS  05/15/2019,12:29 PM

## 2019-05-15 NOTE — Progress Notes (Signed)
  2D Echocardiogram has been performed.  Melinda Kerr 05/15/2019, 12:07 PM

## 2019-05-15 NOTE — Progress Notes (Signed)
SLP Cancellation Note  Patient Details Name: JASANI LENGEL MRN: [id number] DOB: 09-May-1935   Cancelled treatment:       Reason Eval/Treat Not Completed: Patient at procedure or test/unavailable. Will continue efforts.  Ilaisaane Marts L. Tivis Ringer, [place] Office number [phone] Pager [phone]    Juan Quam Laurice 05/15/2019, 2:43 PM

## 2019-05-16 MED ORDER — DOCUSATE SODIUM 100 MG PO CAPS: 100.0000 mg | ORAL_CAPSULE | Freq: Two times a day (BID) | ORAL | 0 refills | Status: DC

## 2019-05-16 MED ORDER — ASPIRIN EC 81 MG PO TBEC: 81.0000 mg | DELAYED_RELEASE_TABLET | Freq: Every day | ORAL | 0 refills | Status: AC

## 2019-05-16 MED ORDER — CLOPIDOGREL BISULFATE 75 MG PO TABS: 75.0000 mg | ORAL_TABLET | Freq: Every day | ORAL | 0 refills | Status: AC

## 2019-05-16 MED ORDER — HYDROCODONE-ACETAMINOPHEN 5-325 MG PO TABS: 1.0000 | ORAL_TABLET | Freq: Four times a day (QID) | ORAL | 0 refills | Status: DC | PRN

## 2019-05-16 NOTE — TOC Transition Note (Signed)
Transition of Care [facility]) - CM/SW Discharge Note   Patient Details  Name: Melinda Kerr MRN: [id number] Date of Birth: [birth year]  Transition of Care [facility]) CM/SW Contact:  Geralynn Ochs, LCSW Phone Number: 05/16/2019, 12:15 PM   Clinical Narrative:   Nurse to call report to [phone], Room 608    Final next level of care: Skilled Nursing Facility Barriers to Discharge: Barriers Resolved   Patient Goals and CMS Choice Patient states their goals for this hospitalization and ongoing recovery are:: get back to [facility] for rehab CMS Medicare.gov Compare Post Acute Care list provided to:: Patient Choice offered to / list presented to : Patient  Discharge Placement              Patient chooses bed at: [facility] Patient to be transferred to facility by: [place] Name of family member notified: [name] Patient and family notified of of transfer: 05/16/19  Discharge Plan and Services                                     Social Determinants of Health (SDOH) Interventions     Readmission Risk Interventions No flowsheet data found.

## 2019-05-16 NOTE — Progress Notes (Signed)
Patient discharging back to [facility]. Report has been called. All questions and concerns addressed. IV taken out, tele removed. All belongings sent with patient. [facility] called for transport. [place]

## 2019-05-16 NOTE — Progress Notes (Signed)
PT Cancellation Note  Patient Details Name: Melinda Kerr MRN: [id number] DOB: [birth year]   Cancelled Treatment:    Reason Eval/Treat Not Completed: Discussed with CSW, and pt does not require acute PT evaluation to return to SNF. Please see OT evaluation from 8/17 for current level of function. If needs change, please reconsult.    Thelma Comp 05/16/2019, 12:54 PM   Rolinda Roan, PT, DPT Acute Rehabilitation Services Pager: [phone] Office: [phone]

## 2019-05-16 NOTE — Consult Note (Signed)
   [facility] CM Inpatient Consult   05/16/2019  Melinda Kerr Mar 22, 1935 [id number]    Patient reviewedfor potential [place] Management services needsunderherMedicare/ NextGen plan, with a 31% extreme high risk score for unplanned readmissions and has 3 hospitalizations in the past 6 months. Patient has been recently followed by [facility] post acute RN for community needs.  Medical record review and MDhistory & physicaldated 05/14/19, reveal as follows: [name]  is a 84 y.o. female, SNF resident with past medical history significant for uterine cancer, OSA, congestive heart failure, DVT, morbid obesity, hypertension and hyperlipidemia, who presented originally with shortness of breath. [facility] and associated right hand weakness [facility]/2021.  Patient had a recent hip fracture on 04/08/2019, repaired. Her CT of the head was concerning for stroke, neurology was consulted.  (Acute left frontal CVA, suspect cryptogenic)  Review of medical record,showsthat patient has been receiving skilled services in SNF setting status post right femur fracture with IM nailing ([facility]). PT/ OT notes reviewed and are recommending return to SNF at d/c for continued rehab therapy.  Pleaserefer to [facility] care managementifthere areany dispositionchangesforfollow-upas appropriate.  Of note, [facility] Care Management services does not replace or interfere with any services that are arranged by transition of care case management or social work.  Will notify [facility] post acute RN coordinator of patient's disposition for post discharge follow upofneeds as appropriate.   For questions or referral, please contact:  Caleb Prigmore A. Conni Knighton, BSN, RN-BC [facility] Liaison Cell: [phone]

## 2019-05-16 NOTE — NC FL2 (Signed)
[place] LEVEL OF CARE SCREENING TOOL     IDENTIFICATION  Patient Name: Melinda Kerr Birthdate: April 08, 1935 Sex: female Admission Date (Current Location): 05/14/2019  [facility] [place] Number:  Herbalist and Address:  [facility] [address] [facility], [place] [address] [place], [place] [address]      Provider Number: [id number]  Attending Physician Name and Address:  [place] Donnamarie Poag, DO  Relative Name and Phone Number:       Current Level of Care: Hospital Recommended Level of Care: [place] Prior Approval Number:    Date Approved/Denied:   PASRR Number: [id number]  Discharge Plan: SNF    Current Diagnoses: Patient Active Problem List   Diagnosis Date Noted  . Acute CVA (cerebrovascular accident) ([place]) 05/15/2019  . TIA (transient ischemic attack) 05/14/2019  . Displaced comminuted fracture of shaft of right femur, initial encounter for closed fracture ([place]) 04/08/2019  . GIB (gastrointestinal bleeding) 11/13/2018  . Anemia associated with acute blood loss 11/13/2018  . Closed fracture of right proximal humerus 10/26/2018  . S/P reverse total shoulder arthroplasty, right 10/26/2018  . Acute appendicitis   . Diverticulitis of large intestine with abscess 07/13/2017  . Colitis 07/13/2017  . Aortic atherosclerosis ([place]) 09/12/2016  . Coronary artery calcification 09/12/2016  . Hiatal hernia 09/12/2016  . Lumbar disc disease 09/12/2016  . Chemotherapy-induced thrombocytopenia 08/04/2016  . Chemotherapy induced neutropenia ([place]) 05/26/2016  . Port catheter in place 05/26/2016  . Encounter for antineoplastic chemotherapy 05/26/2016  . Urinary frequency 04/24/2016  . Obstructive sleep apnea on CPAP 04/24/2016  . History of DVT (deep vein thrombosis) 04/24/2016  . Endometrial cancer, FIGO stage IIIC ([place]) 04/24/2016  . Malignant neoplasm of endometrium ([place]) 03/26/2016  . Dyspnea on exertion 03/19/2016  . Malignant  neoplasm of uterus ([place]) 03/19/2016  . Status post right knee replacement 04/16/2014  . Essential hypertension 04/16/2014  . Postoperative anemia due to acute blood loss 04/12/2014  . Status post total left knee replacement 10/14/2013  . Hypothyroidism 10/14/2013  . UI (urinary incontinence) 10/14/2013  . Dyslipidemia 10/14/2013  . Constipation 10/14/2013  . Gout 10/14/2013  . Anemia 10/14/2013  . Allergic rhinitis 10/14/2013  . Insomnia 10/14/2013  . Depression 10/14/2013  . OA (osteoarthritis) of knee 10/09/2013  . OSA on CPAP 08/10/2013  . Morbid obesity with BMI of 45.0-49.9, adult (HCC)     Orientation RESPIRATION BLADDER Height & Weight     Self, Time, Situation, Place  Normal Incontinent Weight: 240 lb (108.9 kg) Height:  5' (152.4 cm)  BEHAVIORAL SYMPTOMS/MOOD NEUROLOGICAL BOWEL NUTRITION STATUS      Continent Diet(heart healthy)  AMBULATORY STATUS COMMUNICATION OF NEEDS Skin   Extensive Assist Verbally Normal                       Personal Care Assistance Level of Assistance  Bathing, Feeding, Dressing Bathing Assistance: Maximum assistance Feeding assistance: Limited assistance Dressing Assistance: Maximum assistance     Functional Limitations Info  Sight, Hearing, Speech Sight Info: Adequate Hearing Info: Adequate Speech Info: Adequate    SPECIAL CARE FACTORS FREQUENCY  PT (By licensed PT), OT (By licensed OT)     PT Frequency: 5x/wk OT Frequency: 5x/wk            Contractures Contractures Info: Not present    Additional Factors Info  Code Status, Allergies, Psychotropic Code Status Info: Full Allergies Info: Oxybutynin Psychotropic Info: Celexa 10mg  daily  Current Medications (05/16/2019):  This is the current hospital active medication list Current Facility-Administered Medications  Medication Dose Route Frequency Provider Last Rate Last Dose  . acetaminophen (TYLENOL) tablet 650 mg  650 mg Oral Q4H PRN Merton Border, MD        Or  . acetaminophen (TYLENOL) solution 650 mg  650 mg Per Tube Q4H PRN Merton Border, MD       Or  . acetaminophen (TYLENOL) suppository 650 mg  650 mg Rectal Q4H PRN Merton Border, MD      . allopurinol (ZYLOPRIM) tablet 300 mg  300 mg Oral Daily Merton Border, MD   300 mg at 05/16/19 0844  . aspirin EC tablet 325 mg  325 mg Oral Daily Omar Person, NP   325 mg at 05/16/19 0845  . baclofen (LIORESAL) tablet 10 mg  10 mg Oral TID PRN Merton Border, MD      . calcium carbonate (TUMS - dosed in mg elemental calcium) chewable tablet 200 mg of elemental calcium  1 tablet Oral Daily Merton Border, MD   200 mg of elemental calcium at 05/16/19 0845  . citalopram (CELEXA) tablet 10 mg  10 mg Oral Daily Merton Border, MD   10 mg at 05/16/19 0845  . clopidogrel (PLAVIX) tablet 75 mg  75 mg Oral Daily Omar Person, NP   75 mg at 05/16/19 0845  . docusate sodium (COLACE) capsule 100 mg  100 mg Oral BID Merton Border, MD   100 mg at 05/16/19 0846  . enoxaparin (LOVENOX) injection 40 mg  40 mg Subcutaneous Daily Omar Person, NP   40 mg at 05/16/19 0847  . fesoterodine (TOVIAZ) tablet 4 mg  4 mg Oral Daily Merton Border, MD   4 mg at 05/16/19 0846  . furosemide (LASIX) tablet 40 mg  40 mg Oral Daily Merton Border, MD   40 mg at 05/16/19 0843  . HYDROcodone-acetaminophen (NORCO/VICODIN) 5-325 MG per tablet 1-2 tablet  1-2 tablet Oral Q6H PRN Merton Border, MD   2 tablet at 05/15/19 0021  . iron polysaccharides (NIFEREX) capsule 150 mg  150 mg Oral Daily Merton Border, MD      . levothyroxine (SYNTHROID) tablet 125 mcg  125 mcg Oral QAC breakfast [place] Eric J, DO   125 mcg at 05/16/19 8850  . loratadine (CLARITIN) tablet 10 mg  10 mg Oral Daily Merton Border, MD   10 mg at 05/16/19 0846  . Melatonin TABS 3 mg  3 mg Oral QHS Merton Border, MD   3 mg at 05/15/19 2243  . pantoprazole (PROTONIX) EC tablet 40 mg  40 mg Oral BID AC Merton Border, MD   40 mg at 05/16/19 0845  . polyethylene glycol (MIRALAX / GLYCOLAX)  packet 17 g  17 g Oral Daily PRN Merton Border, MD      . Derrill Memo ON 05/18/2019] pravastatin (PRAVACHOL) tablet 10 mg  10 mg Oral Q Adonis Huguenin, MD      . senna-docusate (Senokot-S) tablet 1 tablet  1 tablet Oral Daily Merton Border, MD   1 tablet at 05/16/19 0846  . senna-docusate (Senokot-S) tablet 1 tablet  1 tablet Oral QHS PRN Merton Border, MD      . vitamin B-12 (CYANOCOBALAMIN) tablet 1,000 mcg  1,000 mcg Oral Daily Merton Border, MD   1,000 mcg at 05/16/19 0845  . zolpidem (AMBIEN) tablet 10 mg  10 mg Oral QHS Merton Border, MD   10 mg at 05/15/19 2243  Discharge Medications: Please see discharge summary for a list of discharge medications.  Relevant Imaging Results:  Relevant Lab Results:   Additional Information SS#: [id number]  Geralynn Ochs, LCSW

## 2019-05-16 NOTE — Progress Notes (Signed)
SLP Cancellation Note  Patient Details Name: Melinda Kerr MRN: [id number] DOB: 03-09-1935   Cancelled treatment:  Pt D/Cing back to [facility] today; unable to complete speech/language evaluation.  Rec SLP eval at SNF to determine needs.  Acute care SLP service to s/o.         Juan Quam Laurice 05/16/2019, 1:10 PM

## 2019-05-16 NOTE — Discharge Summary (Signed)
Physician Discharge Summary  Melinda Kerr ZOX:[id number] DOB: 26-May-1935 DOA: 05/14/2019  PCP: Maury Dus, MD  Admit date: 05/14/2019 Discharge date: 05/16/2019  Admitted From: [facility] Disposition:  [facility]  Recommendations for Outpatient Follow-up:  1. Follow up with PCP in 1 week 2. Please obtain BMP/CBC in one week 3. Please follow up on the following pending results:  Home Health: No Equipment/Devices: oxygen 2L [place] prn  Discharge Condition: stable CODE STATUS: Full Code Diet recommendation: Heart Healthy / Carb Modified   History of present illness:  Hospital course:  Acute left frontal CVA, suspect cryptogenic Patient presenting from [facility] with difficulty with speech and associated right hand weakness at approximately 0630 on 05/13/2021. TPA was not administered secondary to presentation outside of window and low NIH score on arrival.  MR brain with small acute posterior left frontal infarct.  Lower extremity Dopplers negative for DVT.  Echocardiogram with EF 60 to 65%, impaired relaxation, LA mildly dilated.  MRA neck with up to 50% stenosis origin right ICA ; no hemodynamically significant/critical flow stenosis  Left carotid and wide patency of bilateral vertebral arteries.  Carotid Dopplers with right and left  ICA 1-39% stenosis.  Neurology was consulted and followed during hospital course.  Hemoglobin A1c 5.2, Total cholesterol 174, HDL 55, LDL 109, triglycerides 51.  Symptoms have spontaneously resolved.  Neurology recommends dual antiplatelet therapy for 3 weeks with Plavix followed by aspirin 81 mg p.o. daily alone.  Continue pravastatin.  Also cardiology was consulted to place a 30-day cardiac event monitor per neurology recommendations, they will coordinate outpatient.  Patient was evaluated by PT, OT, speech therapy with recommendations to return back to SNF.  Patient will discharge back to [facility] today.  Dyspnea: Resolved Patient presented with dyspnea.   CT angiogram chest negative for PE, but notable for large hiatal hernia.    Continue home oxygen as needed.  Depression: Continue Celexa 10 mg p.o. daily  Chronic diastolic congestive heart failure, compensated TTE 8/17 with EF 60-65%, impaired relaxation, LA mildly dilated. Continue furosemide 40 mg p.o. daily  Hypothyroidism: Continue levothyroxine 125 mcg p.o. daily  Allergic rhinitis: Continue loratadine 10 mg p.o. daily  GERD: Continue Protonix 40 mg p.o. twice daily  Hyperlipidemia: Total cholesterol 174, HDL 55, LDL 109, triglycerides 51. Continue pravastatin 10mg  PO daily.  Discharge Diagnoses:  Principal Problem:   Acute CVA (cerebrovascular accident) [facility]) Active Problems:   Morbid obesity with BMI of 45.0-49.9, adult (HCC)   Hypothyroidism   Dyslipidemia   Depression   Essential hypertension   TIA (transient ischemic attack)    Discharge Instructions  Discharge Instructions    Call MD for:  difficulty breathing, headache or visual disturbances   Complete by: As directed    Call MD for:  extreme fatigue   Complete by: As directed    Call MD for:  persistant dizziness or light-headedness   Complete by: As directed    Call MD for:  persistant nausea and vomiting   Complete by: As directed    Call MD for:  severe uncontrolled pain   Complete by: As directed    Call MD for:  temperature >100.4   Complete by: As directed    Diet - low sodium heart healthy   Complete by: As directed    Increase activity slowly   Complete by: As directed      Allergies as of 05/16/2019      Reactions   Oxybutynin Other (See Comments)  Bleeding gums, felt sick      Medication List    STOP taking these medications   Eliquis 5 MG Tabs tablet Generic drug: apixaban   enoxaparin 40 MG/0.4ML injection Commonly known as: LOVENOX     TAKE these medications   acetaminophen 325 MG tablet Commonly known as: TYLENOL Take 2 tablets (650 mg total) by mouth every 6  (six) hours as needed for mild pain or moderate pain (pain score 1-3 or temp > 100.5).   albuterol 108 (90 Base) MCG/ACT inhaler Commonly known as: VENTOLIN HFA Inhale 2 puffs into the lungs every 6 (six) hours as needed for wheezing or shortness of breath.   alendronate 70 MG tablet Commonly known as: FOSAMAX Take 70 mg by mouth every Friday. Take with a full glass of water on an empty stomach.   allopurinol 300 MG tablet Commonly known as: ZYLOPRIM Take 300 mg by mouth daily.   aspirin EC 81 MG tablet Take 1 tablet (81 mg total) by mouth daily.   baclofen 10 MG tablet Commonly known as: LIORESAL Take 1 tablet (10 mg total) by mouth 3 (three) times daily as needed for muscle spasms.   calcium carbonate 500 MG chewable tablet Commonly known as: TUMS - dosed in mg elemental calcium Chew 1 tablet by mouth daily.   cetirizine 10 MG tablet Commonly known as: ZYRTEC Take 10 mg by mouth daily.   citalopram 20 MG tablet Commonly known as: CELEXA Take 20 mg by mouth daily.   citalopram 10 MG tablet Commonly known as: CELEXA Take 10 mg by mouth daily.   clopidogrel 75 MG tablet Commonly known as: PLAVIX Take 1 tablet (75 mg total) by mouth daily for 21 days. Start taking on: May 17, 2019   docusate sodium 100 MG capsule Commonly known as: COLACE Take 1 capsule (100 mg total) by mouth 2 (two) times daily.   Fish Oil 1000 MG Caps Take 1,000 mg by mouth daily.   furosemide 40 MG tablet Commonly known as: LASIX Take 40 mg by mouth daily.   HYDROcodone-acetaminophen 5-325 MG tablet Commonly known as: Norco Take 1-2 tablets by mouth every 6 (six) hours as needed for severe pain (Use tylenol for mild and moderate pain). What changed:   how much to take  when to take this  reasons to take this   hydrOXYzine 25 MG tablet Commonly known as: ATARAX/VISTARIL Take 25 mg by mouth at bedtime.   iron polysaccharides 150 MG capsule Commonly known as: NIFEREX Take 150 mg  by mouth daily.   levothyroxine 125 MCG tablet Commonly known as: SYNTHROID Take 125 mcg by mouth daily at 6 (six) AM.   magnesium hydroxide 400 MG/5ML suspension Commonly known as: MILK OF MAGNESIA Take 30 mLs by mouth daily as needed for mild constipation.   Melatonin 3 MG Tabs Take 3 mg by mouth at bedtime.   metoCLOPramide 5 MG tablet Commonly known as: REGLAN Take 5 mg by mouth 4 (four) times daily.   nystatin powder Generic drug: nystatin Apply 1 Bottle topically See admin instructions. Apply topically to abdominal skin folds and under bilateral breast for intertrigo - twice daily - please make sure skin has been cleaned and is dry before applying   OXYGEN Inhale 2 L into the lungs continuous. To maintain O2 sat greater then 90% every shift   pantoprazole 40 MG tablet Commonly known as: PROTONIX Take 1 tablet (40 mg total) by mouth 2 (two) times daily before a meal.  polyethylene glycol 17 g packet Commonly known as: MIRALAX / GLYCOLAX Take 17 g by mouth daily as needed for mild constipation or moderate constipation. What changed: when to take this   pravastatin 10 MG tablet Commonly known as: PRAVACHOL Take 10 mg by mouth every Thursday. At bedtime   pyridOXINE 100 MG tablet Commonly known as: VITAMIN B-6 Take 100 mg by mouth daily.   saccharomyces boulardii 250 MG capsule Commonly known as: FLORASTOR Take 250 mg by mouth 2 (two) times daily.   senna-docusate 8.6-50 MG tablet Commonly known as: Senokot-S Take 1 tablet by mouth daily.   tolterodine 4 MG 24 hr capsule Commonly known as: DETROL LA Take 4 mg by mouth every evening.   vitamin B-12 1000 MCG tablet Commonly known as: CYANOCOBALAMIN Take 1,000 mcg by mouth daily.   vitamin C 500 MG tablet Commonly known as: ASCORBIC ACID Take 500 mg by mouth daily.   Vitamin D (Ergocalciferol) 1.25 MG (50000 UT) Caps capsule Commonly known as: DRISDOL Take 50,000 Units by mouth every 7 (seven) days.  Monday   zolpidem 10 MG tablet Commonly known as: AMBIEN Take 10 mg by mouth at bedtime.       Allergies  Allergen Reactions  . Oxybutynin Other (See Comments)    Bleeding gums, felt sick    Consultations:  Neurology   Procedures/Studies: Ct Angio Chest Pe W And/or Wo Contrast  Result Date: 05/14/2019 CLINICAL DATA:  Short of breath.  Hip replacement. EXAM: CT ANGIOGRAPHY CHEST WITH CONTRAST TECHNIQUE: Multidetector CT imaging of the chest was performed using the standard protocol during bolus administration of intravenous contrast. Multiplanar CT image reconstructions and MIPs were obtained to evaluate the vascular anatomy. CONTRAST:  60mL OMNIPAQUE IOHEXOL 350 MG/ML SOLN COMPARISON:  CT 10/27/2018 FINDINGS: Cardiovascular: No filling defects within the pulmonary arteries to suggest acute pulmonary embolism. No acute findings of the aorta or great vessels. No pericardial fluid. Atelectasis in the medial RIGHT lower lobe associated large hiatal hernia which does limit evaluation of the segment. Mediastinum/Nodes: No axillary or supraclavicular adenopathy. No mediastinal hilar adenopathy. Lungs/Pleura: No pulmonary infarction. Atelectasis in the medial RIGHT lower lobe associated with the large hiatal hernia. Upper Abdomen: Large hiatal hernia extending in the medial RIGHT hemithorax. Musculoskeletal: No aggressive osseous lesion. Review of the MIP images confirms the above findings. IMPRESSION: 1. No evidence acute pulmonary embolism. 2. No acute pulmonary parenchymal findings. 3. Large hiatal hernia with associated RIGHT lower lobe atelectasis. Electronically Signed   By: Suzy Bouchard M.D.   On: 05/14/2019 12:38   Mr Angio Head Wo Contrast  Result Date: 05/14/2019 CLINICAL DATA:  Stroke follow-up.  Right-sided weakness. EXAM: MRI HEAD WITHOUT CONTRAST MRA HEAD WITHOUT CONTRAST TECHNIQUE: Multiplanar, multiecho pulse sequences of the brain and surrounding structures were obtained  without intravenous contrast. Angiographic images of the head were obtained using MRA technique without contrast. COMPARISON:  Head CT 05/14/2019 FINDINGS: MRI HEAD FINDINGS Brain: There is a small acute cortical infarct posteriorly in the left frontal lobe corresponding to the low-density on CT. Mild cerebral atrophy is within normal limits for age. T2 hyperintensities in the cerebral white matter and pons are nonspecific but compatible with mild chronic small vessel ischemic disease. There are subcentimeter chronic infarcts in the cerebellum. Vascular: Major intracranial vascular flow voids are preserved. Skull and upper cervical spine: Unremarkable bone marrow signal. Disc degeneration at C3-4. Sinuses/Orbits: Bilateral cataract extraction. Mild right sphenoid sinus mucosal thickening. Clear mastoid air cells. Other: None. MRA HEAD FINDINGS  The study is mildly to moderately motion degraded. The visualized distal vertebral arteries are widely patent to the basilar with the right being strongly dominant. Patent right PICA, left AICA, and bilateral SCA origins are visualized. The basilar artery is widely patent. There are small posterior communicating arteries bilaterally. Both PCAs are patent without evidence of significant proximal stenosis. The internal carotid arteries are patent from skull base to carotid termini. There is moderate motion artifact through the carotid siphons without definite flow limiting stenosis. ACAs and MCAs are patent with mild branch vessel irregularity but no evidence of proximal branch occlusion or flow limiting proximal stenosis. No aneurysm is identified. IMPRESSION: 1. Small acute posterior left frontal infarct. 2. Mild chronic small vessel ischemic disease. 3. Motion degraded head MRA without evidence of large vessel occlusion or flow limiting proximal stenosis. Electronically Signed   By: Logan Bores M.D.   On: 05/14/2019 18:21   Mr Angiogram Neck W Or Wo Contrast  Result  Date: 05/15/2019 CLINICAL DATA:  Follow-up examination for acute stroke. EXAM: MRA NECK WITHOUT AND WITH CONTRAST TECHNIQUE: Multiplanar and multiecho pulse sequences of the neck were obtained without and with intravenous contrast. Angiographic images of the neck were obtained using MRA technique without and with intravenous contrast. CONTRAST:  10 cc of Gadavist. COMPARISON:  Prior MRI and MRA of the head from 04/13/2019. FINDINGS: Source images reviewed. Visualized aortic arch of normal caliber with normal branch pattern. No hemodynamically significant stenosis seen about the origin of the great vessels. Visualized subclavian arteries widely patent. Right common carotid artery widely patent from its origin to the bifurcation without stenosis. Mild atherosclerotic change about the right bifurcation with associated short-segment stenosis of up to approximately 50% by NASCET criteria. Right ICA widely patent distally to the circle-of-Willis without stenosis, dissection, or occlusion. Left CCA tortuous but widely patent from its origin to the bifurcation without stenosis. Mild atheromatous irregularity about the left bifurcation/proximal left ICA without hemodynamically significant stenosis. Left ICA tortuous but widely patent distally to the circle-of-Willis without stenosis, dissection, or occlusion. Left vertebral artery diffusely hypoplastic and arises separately from the aortic arch. Right vertebral artery arises from the right subclavian artery. Vertebral arteries patent within the neck without stenosis, dissection, or occlusion. IMPRESSION: 1. Short-segment atheromatous stenosis of up to approximately 50% by NASCET criteria at the origin of the right ICA. 2. No hemodynamically significant or critical flow limiting stenosis within the left carotid artery system. 3. Wide patency of both vertebral arteries within the neck. Right vertebral artery dominant. 4. Diffuse tortuosity of the major arterial vasculature of  the neck, suggesting chronic underlying hypertension. Electronically Signed   By: Jeannine Boga M.D.   On: 05/15/2019 04:16   Mr Brain Wo Contrast  Result Date: 05/14/2019 CLINICAL DATA:  Stroke follow-up.  Right-sided weakness. EXAM: MRI HEAD WITHOUT CONTRAST MRA HEAD WITHOUT CONTRAST TECHNIQUE: Multiplanar, multiecho pulse sequences of the brain and surrounding structures were obtained without intravenous contrast. Angiographic images of the head were obtained using MRA technique without contrast. COMPARISON:  Head CT 05/14/2019 FINDINGS: MRI HEAD FINDINGS Brain: There is a small acute cortical infarct posteriorly in the left frontal lobe corresponding to the low-density on CT. Mild cerebral atrophy is within normal limits for age. T2 hyperintensities in the cerebral white matter and pons are nonspecific but compatible with mild chronic small vessel ischemic disease. There are subcentimeter chronic infarcts in the cerebellum. Vascular: Major intracranial vascular flow voids are preserved. Skull and upper cervical spine: Unremarkable bone marrow  signal. Disc degeneration at C3-4. Sinuses/Orbits: Bilateral cataract extraction. Mild right sphenoid sinus mucosal thickening. Clear mastoid air cells. Other: None. MRA HEAD FINDINGS The study is mildly to moderately motion degraded. The visualized distal vertebral arteries are widely patent to the basilar with the right being strongly dominant. Patent right PICA, left AICA, and bilateral SCA origins are visualized. The basilar artery is widely patent. There are small posterior communicating arteries bilaterally. Both PCAs are patent without evidence of significant proximal stenosis. The internal carotid arteries are patent from skull base to carotid termini. There is moderate motion artifact through the carotid siphons without definite flow limiting stenosis. ACAs and MCAs are patent with mild branch vessel irregularity but no evidence of proximal branch  occlusion or flow limiting proximal stenosis. No aneurysm is identified. IMPRESSION: 1. Small acute posterior left frontal infarct. 2. Mild chronic small vessel ischemic disease. 3. Motion degraded head MRA without evidence of large vessel occlusion or flow limiting proximal stenosis. Electronically Signed   By: Logan Bores M.D.   On: 05/14/2019 18:21   Ct Head Code Stroke Wo Contrast  Result Date: 05/14/2019 CLINICAL DATA:  Code stroke.  Right facial droop EXAM: CT HEAD WITHOUT CONTRAST TECHNIQUE: Contiguous axial images were obtained from the base of the skull through the vertex without intravenous contrast. COMPARISON:  10/19/2018 head CT report, images not available FINDINGS: Brain: Small area of cortex blurring in the posterior left frontal lobe. Mild chronic small vessel ischemic change in the cerebral white matter. No hemorrhage, hydrocephalus, or collection. Mild for age volume loss. Vascular: No hyperdense vessel. Skull: Negative Sinuses/Orbits: Negative Other: These results were communicated to Dr. Leonie Man at 11:50 amon 8/16/2020by text page via the [facility] messaging system. ASPECTS [facility] Stroke Program Early CT Score) - Ganglionic level infarction (caudate, lentiform nuclei, internal capsule, insula, M1-M3 cortex): 7 - Supraganglionic infarction (M4-M6 cortex): 2 Total score (0-10 with 10 being normal): 9 IMPRESSION: 1. Suspect small left posterior frontal cortex infarct. ASPECTS is 9. 2. No acute hemorrhage. Electronically Signed   By: Monte Fantasia M.D.   On: 05/14/2019 11:52   Vas US Carotid  Result Date: 05/15/2019 Carotid Arterial Duplex Study Indications: CVA, Speech disturbance and Weakness. Performing Technologist: Toma Copier RVS  Examination Guidelines: A complete evaluation includes B-mode imaging, spectral Doppler, color Doppler, and power Doppler as needed of all accessible portions of each vessel. Bilateral testing is considered an integral part of a complete examination.  Limited examinations for reoccurring indications may be performed as noted.  Right Carotid Findings: +----------+--------+--------+--------+--------------------+-------------------+           PSV cm/sEDV cm/sStenosisDescribe            Comments            +----------+--------+--------+--------+--------------------+-------------------+ CCA Prox  106     14                                  mild intimal wall                                                         changes             +----------+--------+--------+--------+--------------------+-------------------+ CCA Distal56      5  mild intimal wall                                                         changes             +----------+--------+--------+--------+--------------------+-------------------+ ICA Prox  76      21      1-39%   heterogenous and    mild plaque on the                                    focal               far wall at the                                                           bifurcation         +----------+--------+--------+--------+--------------------+-------------------+ ICA Mid   64      15                                                      +----------+--------+--------+--------+--------------------+-------------------+ ICA Distal41      10                                  tortuous            +----------+--------+--------+--------+--------------------+-------------------+ ECA       107     11              heterogenous        mild plaque         +----------+--------+--------+--------+--------------------+-------------------+ +----------+--------+-------+--------+-------------------+           PSV cm/sEDV cmsDescribeArm Pressure (mmHG) +----------+--------+-------+--------+-------------------+ Subclavian127                                        +----------+--------+-------+--------+-------------------+  +---------+--------+--+--------+-+ VertebralPSV cm/s31EDV cm/s9 +---------+--------+--+--------+-+  Left Carotid Findings: +----------+--------+--------+--------+------------+-------------------------+           PSV cm/sEDV cm/sStenosisDescribe    Comments                  +----------+--------+--------+--------+------------+-------------------------+ CCA Prox  81      15                                                    +----------+--------+--------+--------+------------+-------------------------+ CCA Distal109     19                          mild intimal wall changes +----------+--------+--------+--------+------------+-------------------------+ ICA Prox  90      19  1-39%   heterogenousminimal palque            +----------+--------+--------+--------+------------+-------------------------+ ICA Mid   68      17                          tortuous                  +----------+--------+--------+--------+------------+-------------------------+ ICA Distal66      24                          tortuous                  +----------+--------+--------+--------+------------+-------------------------+ ECA       167     12                          mild intimal wall changes +----------+--------+--------+--------+------------+-------------------------+ +----------+--------+--------+--------+-------------------+ SubclavianPSV cm/sEDV cm/sDescribeArm Pressure (mmHG) +----------+--------+--------+--------+-------------------+           116     11                                  +----------+--------+--------+--------+-------------------+ +---------+--------+--+--------+-+ VertebralPSV cm/s28EDV cm/s3 +---------+--------+--+--------+-+  Summary: Right Carotid: Velocities in the right ICA are consistent with a 1-39% stenosis. Left Carotid: Velocities in the left ICA are consistent with a 1-39% stenosis. Vertebrals:  Bilateral vertebral arteries demonstrate  antegrade flow. Subclavians: Normal flow hemodynamics were seen in bilateral subclavian              arteries. *See table(s) above for measurements and observations.     Preliminary    Vas [place] Lower Extremity Venous (dvt)  Result Date: 05/15/2019  Lower Venous Study Indications: Stroke.  Limitations: Body habitus. Comparison Study: No previous study available for comparison Performing Technologist: Toma Copier RVS  Examination Guidelines: A complete evaluation includes B-mode imaging, spectral Doppler, color Doppler, and power Doppler as needed of all accessible portions of each vessel. Bilateral testing is considered an integral part of a complete examination. Limited examinations for reoccurring indications may be performed as noted.  +---------+---------------+---------+-----------+----------+-------+ RIGHT    CompressibilityPhasicitySpontaneityPropertiesSummary +---------+---------------+---------+-----------+----------+-------+ CFV      Full           Yes      Yes                          +---------+---------------+---------+-----------+----------+-------+ SFJ      Full                                                 +---------+---------------+---------+-----------+----------+-------+ FV Prox  Full           Yes      Yes                          +---------+---------------+---------+-----------+----------+-------+ FV Mid   Full                                                 +---------+---------------+---------+-----------+----------+-------+ FV DistalFull  Yes      Yes                          +---------+---------------+---------+-----------+----------+-------+ PFV      Full           Yes      Yes                          +---------+---------------+---------+-----------+----------+-------+ POP      Full           Yes      Yes                          +---------+---------------+---------+-----------+----------+-------+ PTV      Full                                                  +---------+---------------+---------+-----------+----------+-------+ PERO     Full                                                 +---------+---------------+---------+-----------+----------+-------+   +---------+---------------+---------+-----------+----------+-------+ LEFT     CompressibilityPhasicitySpontaneityPropertiesSummary +---------+---------------+---------+-----------+----------+-------+ CFV      Full           Yes      Yes                          +---------+---------------+---------+-----------+----------+-------+ SFJ      Full                                                 +---------+---------------+---------+-----------+----------+-------+ FV Prox  Full           Yes      Yes                          +---------+---------------+---------+-----------+----------+-------+ FV Mid   Full                                                 +---------+---------------+---------+-----------+----------+-------+ FV DistalFull           Yes      Yes                          +---------+---------------+---------+-----------+----------+-------+ PFV      Full           Yes      Yes                          +---------+---------------+---------+-----------+----------+-------+ POP      Full           Yes      Yes                          +---------+---------------+---------+-----------+----------+-------+  PTV      Full                                                 +---------+---------------+---------+-----------+----------+-------+ PERO     Full                                                 +---------+---------------+---------+-----------+----------+-------+     Summary: Right: There is no evidence of deep vein thrombosis in the lower extremity. No cystic structure found in the popliteal fossa. Left: There is no evidence of deep vein thrombosis in the lower extremity. No cystic structure found in  the popliteal fossa.  *See table(s) above for measurements and observations. Electronically signed by Servando Snare MD on 05/15/2019 at 2:17:19 PM.    Final      Transthoracic echocardiogram 05/15/2019: IMPRESSIONS   1. The left ventricle has normal systolic function with an ejection fraction of 60-65%. The cavity size was normal. Left ventricular diastolic Doppler parameters are consistent with impaired relaxation. No evidence of left ventricular regional wall  motion abnormalities. 2. The right ventricle has normal systolic function. The cavity was normal. There is no increase in right ventricular wall thickness. 3. Left atrial size was mildly dilated. 4. Mild aortic annular calcification noted. 5. There is moderate mitral annular calcification present. 6. The aorta is normal unless otherwise noted. 7. Poor acoustic windows for bubble study. Unable to determine if there is bubble cross over.  Carotid Dopplers 05/15/2019: Summary: Right Carotid: Velocities in the right ICA are consistent with a 1-39% stenosis.  Left Carotid: Velocities in the left ICA are consistent with a 1-39% stenosis.  Vertebrals: Bilateral vertebral arteries demonstrate antegrade flow. Subclavians: Normal flow hemodynamics were seen in bilateral subclavian arteries.  Bilateral lower extremity venous duplex ultrasound 05/15/2019: Summary: Right: There is no evidence of deep vein thrombosis in the lower extremity. No cystic structure found in the popliteal fossa. Left: There is no evidence of deep vein thrombosis in the lower extremity. No cystic structure found in the popliteal fossa.   Subjective: Patient seen and examined at bedside, resting comfortably.  Dysphasia and weakness have completely resolved.  Ready for discharge back home to SNF.  No specific complaints this morning.  Denies headache, no fever/chills/night sweats, no nausea vomiting/diarrhea, no chest pain, no palpitations, no  shortness of breath, no abdominal pain, no issues with bowel/bladder function, no cough/congestion, no weakness, no paresthesias.  No acute events overnight per nursing staff.   Discharge Exam: Vitals:   05/16/19 0700 05/16/19 1044  BP: (!) 141/83 (!) 121/99  Pulse: 72 93  Resp: 17 17  Temp: 97.8 F (36.6 C) 99 F (37.2 C)  SpO2: 99% 97%   Vitals:   05/16/19 0012 05/16/19 0311 05/16/19 0700 05/16/19 1044  BP: 133/79 (!) 151/76 (!) 141/83 (!) 121/99  Pulse: 74 76 72 93  Resp: 18 18 17 17   Temp: 98.6 F (37 C) 97.8 F (36.6 C) 97.8 F (36.6 C) 99 F (37.2 C)  TempSrc: Oral Oral Axillary Axillary  SpO2: 96% 97% 99% 97%  Weight:      Height:        General: Pt is alert, awake, not in acute distress Cardiovascular:  RRR, S1/S2 +, no rubs, no gallops Respiratory: CTA bilaterally, no wheezing, no rhonchi Abdominal: Soft, NT, ND, bowel sounds + Extremities: no edema, no cyanosis    The results of significant diagnostics from this hospitalization (including imaging, microbiology, ancillary and laboratory) are listed below for reference.     Microbiology: Recent Results (from the past 240 hour(s))  SARS Coronavirus 2 [facility] order, Performed in [facility] hospital lab) Nasopharyngeal Urine, Random     Status: None   Collection Time: 05/14/19 11:50 AM   Specimen: Urine, Random; Nasopharyngeal  Result Value Ref Range Status   SARS Coronavirus 2 NEGATIVE NEGATIVE Final    Comment: (NOTE) If result is NEGATIVE SARS-CoV-2 target nucleic acids are NOT DETECTED. The SARS-CoV-2 RNA is generally detectable in upper and lower  respiratory specimens during the acute phase of infection. The lowest  concentration of SARS-CoV-2 viral copies this assay can detect is 250  copies / mL. A negative result does not preclude SARS-CoV-2 infection  and should not be used as the sole basis for treatment or other  patient management decisions.  A negative result may occur with  improper  specimen collection / handling, submission of specimen other  than nasopharyngeal swab, presence of viral mutation(s) within the  areas targeted by this assay, and inadequate number of viral copies  (<250 copies / mL). A negative result must be combined with clinical  observations, patient history, and epidemiological information. If result is POSITIVE SARS-CoV-2 target nucleic acids are DETECTED. The SARS-CoV-2 RNA is generally detectable in upper and lower  respiratory specimens dur ing the acute phase of infection.  Positive  results are indicative of active infection with SARS-CoV-2.  Clinical  correlation with patient history and other diagnostic information is  necessary to determine patient infection status.  Positive results do  not rule out bacterial infection or co-infection with other viruses. If result is PRESUMPTIVE POSTIVE SARS-CoV-2 nucleic acids MAY BE PRESENT.   A presumptive positive result was obtained on the submitted specimen  and confirmed on repeat testing.  While 2019 novel coronavirus  (SARS-CoV-2) nucleic acids may be present in the submitted sample  additional confirmatory testing may be necessary for epidemiological  and / or clinical management purposes  to differentiate between  SARS-CoV-2 and other Sarbecovirus currently known to infect humans.  If clinically indicated additional testing with an alternate test  methodology [phone]) is advised. The SARS-CoV-2 RNA is generally  detectable in upper and lower respiratory sp ecimens during the acute  phase of infection. The expected result is Negative. Fact Sheet for Patients:  StrictlyIdeas.no Fact Sheet for Healthcare Providers: BankingDealers.co.za This test is not yet approved or cleared by the [place] FDA and has been authorized for detection and/or diagnosis of SARS-CoV-2 by FDA under an Emergency Use Authorization (EUA).  This EUA will remain in  effect (meaning this test can be used) for the duration of the COVID-19 declaration under Section 564(b)(1) of the Act, 21 U.S.C. section 360bbb-3(b)(1), unless the authorization is terminated or revoked sooner. Performed at [facility], [place] [address], [place], [place] [address]      Labs: BNP (last 3 results) Recent Labs    10/27/18 1129 05/14/19 1130  BNP 331.3* 916.6*   Basic Metabolic Panel: Recent Labs  Lab 05/14/19 1130 05/14/19 1132  NA 136 138  K 4.2 4.2  CL 98 98  CO2 27  --   GLUCOSE 154* 143*  BUN 19 21  CREATININE 0.99 1.00  CALCIUM 9.2  --    Liver Function Tests: Recent Labs  Lab 05/14/19 1130  AST 21  ALT 13  ALKPHOS 112  BILITOT 0.3  PROT 5.5*  ALBUMIN 2.9*   No results for input(s): LIPASE, AMYLASE in the last 168 hours. No results for input(s): AMMONIA in the last 168 hours. CBC: Recent Labs  Lab 05/14/19 1130 05/14/19 1132  WBC 6.4  --   NEUTROABS 4.4  --   HGB 9.5* 11.9*  HCT 34.4* 35.0*  MCV 85.8  --   PLT 340  --    Cardiac Enzymes: No results for input(s): CKTOTAL, CKMB, CKMBINDEX, TROPONINI in the last 168 hours. BNP: Invalid input(s): POCBNP CBG: Recent Labs  Lab 05/14/19 1034  GLUCAP 162*   D-Dimer No results for input(s): DDIMER in the last 72 hours. Hgb A1c Recent Labs    05/14/19 1130 05/15/19 0208  HGBA1C 5.2 5.2   Lipid Profile Recent Labs    05/14/19 1130 05/15/19 0208  CHOL 174 174  HDL 54 55  LDLCALC 107* 109*  TRIG 63 51  CHOLHDL 3.2 3.2   Thyroid function studies No results for input(s): TSH, T4TOTAL, T3FREE, THYROIDAB in the last 72 hours.  Invalid input(s): FREET3 Anemia work up No results for input(s): VITAMINB12, FOLATE, FERRITIN, TIBC, IRON, RETICCTPCT in the last 72 hours. Urinalysis    Component Value Date/Time   COLORURINE COLORLESS (A) 05/14/2019 1331   APPEARANCEUR CLEAR 05/14/2019 1331   LABSPEC 1.018 05/14/2019 1331   LABSPEC 1.020 05/04/2016 1006   PHURINE 5.0  05/14/2019 1331   GLUCOSEU NEGATIVE 05/14/2019 1331   GLUCOSEU Negative 05/04/2016 1006   HGBUR NEGATIVE 05/14/2019 1331   BILIRUBINUR NEGATIVE 05/14/2019 1331   BILIRUBINUR Negative 05/04/2016 1006   KETONESUR NEGATIVE 05/14/2019 1331   PROTEINUR NEGATIVE 05/14/2019 1331   UROBILINOGEN 0.2 05/04/2016 1006   NITRITE NEGATIVE 05/14/2019 1331   LEUKOCYTESUR NEGATIVE 05/14/2019 1331   LEUKOCYTESUR Small 05/04/2016 1006   Sepsis Labs Invalid input(s): PROCALCITONIN,  WBC,  LACTICIDVEN Microbiology Recent Results (from the past 240 hour(s))  SARS Coronavirus 2 [facility] order, Performed in [facility] hospital lab) Nasopharyngeal Urine, Random     Status: None   Collection Time: 05/14/19 11:50 AM   Specimen: Urine, Random; Nasopharyngeal  Result Value Ref Range Status   SARS Coronavirus 2 NEGATIVE NEGATIVE Final    Comment: (NOTE) If result is NEGATIVE SARS-CoV-2 target nucleic acids are NOT DETECTED. The SARS-CoV-2 RNA is generally detectable in upper and lower  respiratory specimens during the acute phase of infection. The lowest  concentration of SARS-CoV-2 viral copies this assay can detect is 250  copies / mL. A negative result does not preclude SARS-CoV-2 infection  and should not be used as the sole basis for treatment or other  patient management decisions.  A negative result may occur with  improper specimen collection / handling, submission of specimen other  than nasopharyngeal swab, presence of viral mutation(s) within the  areas targeted by this assay, and inadequate number of viral copies  (<250 copies / mL). A negative result must be combined with clinical  observations, patient history, and epidemiological information. If result is POSITIVE SARS-CoV-2 target nucleic acids are DETECTED. The SARS-CoV-2 RNA is generally detectable in upper and lower  respiratory specimens dur ing the acute phase of infection.  Positive  results are indicative of active infection with  SARS-CoV-2.  Clinical  correlation with patient history and other diagnostic information is  necessary to determine patient infection  status.  Positive results do  not rule out bacterial infection or co-infection with other viruses. If result is PRESUMPTIVE POSTIVE SARS-CoV-2 nucleic acids MAY BE PRESENT.   A presumptive positive result was obtained on the submitted specimen  and confirmed on repeat testing.  While 2019 novel coronavirus  (SARS-CoV-2) nucleic acids may be present in the submitted sample  additional confirmatory testing may be necessary for epidemiological  and / or clinical management purposes  to differentiate between  SARS-CoV-2 and other Sarbecovirus currently known to infect humans.  If clinically indicated additional testing with an alternate test  methodology [phone]) is advised. The SARS-CoV-2 RNA is generally  detectable in upper and lower respiratory sp ecimens during the acute  phase of infection. The expected result is Negative. Fact Sheet for Patients:  StrictlyIdeas.no Fact Sheet for Healthcare Providers: BankingDealers.co.za This test is not yet approved or cleared by the [place] FDA and has been authorized for detection and/or diagnosis of SARS-CoV-2 by FDA under an Emergency Use Authorization (EUA).  This EUA will remain in effect (meaning this test can be used) for the duration of the COVID-19 declaration under Section 564(b)(1) of the Act, 21 U.S.C. section 360bbb-3(b)(1), unless the authorization is terminated or revoked sooner. Performed at [facility], [place] [address] [place], [place] [address]      Time coordinating discharge: Over 30 minutes  SIGNED:    J [place] DO  [facility] 05/16/2019, 10:56 AM

## 2019-05-16 NOTE — Discharge Instructions (Signed)

## 2019-05-17 ENCOUNTER — Other Ambulatory Visit: Payer: Self-pay | Admitting: *Deleted

## 2019-05-17 DIAGNOSIS — M79604 Pain in right leg: Secondary | ICD-10-CM | POA: Diagnosis not present

## 2019-05-17 DIAGNOSIS — R278 Other lack of coordination: Secondary | ICD-10-CM | POA: Diagnosis not present

## 2019-05-17 DIAGNOSIS — Z9181 History of falling: Secondary | ICD-10-CM | POA: Diagnosis not present

## 2019-05-17 DIAGNOSIS — I639 Cerebral infarction, unspecified: Secondary | ICD-10-CM | POA: Diagnosis not present

## 2019-05-17 DIAGNOSIS — S72351D Displaced comminuted fracture of shaft of right femur, subsequent encounter for closed fracture with routine healing: Secondary | ICD-10-CM | POA: Diagnosis not present

## 2019-05-17 DIAGNOSIS — M6281 Muscle weakness (generalized): Secondary | ICD-10-CM | POA: Diagnosis not present

## 2019-05-17 NOTE — Patient Outreach (Signed)
Member assessed for potential [facility] Care Management needs as a benefit of  [place] [employer].  Confirmed with [facility] RN and Patient Melinda Kerr that [name] returned back to [facility] on 05/16/19 under private pay. She resides at [facility] as a long term resident.  Writer will sign off. No identifiable [facility] Care Management needs at this time.    Marthenia Rolling, MSN-Ed, RN,BSN [place] Acute Care Coordinator [phone] [facility]) [phone]  (Toll free office)

## 2019-05-18 ENCOUNTER — Telehealth: Payer: Self-pay | Admitting: *Deleted

## 2019-05-18 DIAGNOSIS — M79604 Pain in right leg: Secondary | ICD-10-CM | POA: Diagnosis not present

## 2019-05-18 DIAGNOSIS — R278 Other lack of coordination: Secondary | ICD-10-CM | POA: Diagnosis not present

## 2019-05-18 DIAGNOSIS — R0602 Shortness of breath: Secondary | ICD-10-CM | POA: Diagnosis not present

## 2019-05-18 DIAGNOSIS — M6281 Muscle weakness (generalized): Secondary | ICD-10-CM | POA: Diagnosis not present

## 2019-05-18 DIAGNOSIS — Z9181 History of falling: Secondary | ICD-10-CM | POA: Diagnosis not present

## 2019-05-18 DIAGNOSIS — S72351D Displaced comminuted fracture of shaft of right femur, subsequent encounter for closed fracture with routine healing: Secondary | ICD-10-CM | POA: Diagnosis not present

## 2019-05-18 DIAGNOSIS — I639 Cerebral infarction, unspecified: Secondary | ICD-10-CM | POA: Diagnosis not present

## 2019-05-18 NOTE — Telephone Encounter (Signed)
Spoke with patients nurse, Kelly, at [facility].  [employer] to ship a 30 day cardiac event monitor to: [facility] c/o Johara Olesky [address], [address] [place], [place]  Instructions will be included in the monitor kit, and patient or faculty may call [employer] directly at [phone] with questions. 

## 2019-05-19 DIAGNOSIS — M6281 Muscle weakness (generalized): Secondary | ICD-10-CM | POA: Diagnosis not present

## 2019-05-19 DIAGNOSIS — Z9181 History of falling: Secondary | ICD-10-CM | POA: Diagnosis not present

## 2019-05-19 DIAGNOSIS — M79604 Pain in right leg: Secondary | ICD-10-CM | POA: Diagnosis not present

## 2019-05-19 DIAGNOSIS — S72351D Displaced comminuted fracture of shaft of right femur, subsequent encounter for closed fracture with routine healing: Secondary | ICD-10-CM | POA: Diagnosis not present

## 2019-05-19 DIAGNOSIS — I639 Cerebral infarction, unspecified: Secondary | ICD-10-CM | POA: Diagnosis not present

## 2019-05-19 DIAGNOSIS — R278 Other lack of coordination: Secondary | ICD-10-CM | POA: Diagnosis not present

## 2019-05-22 DIAGNOSIS — Z9181 History of falling: Secondary | ICD-10-CM | POA: Diagnosis not present

## 2019-05-22 DIAGNOSIS — R278 Other lack of coordination: Secondary | ICD-10-CM | POA: Diagnosis not present

## 2019-05-22 DIAGNOSIS — S72351D Displaced comminuted fracture of shaft of right femur, subsequent encounter for closed fracture with routine healing: Secondary | ICD-10-CM | POA: Diagnosis not present

## 2019-05-22 DIAGNOSIS — I639 Cerebral infarction, unspecified: Secondary | ICD-10-CM | POA: Diagnosis not present

## 2019-05-22 DIAGNOSIS — M79604 Pain in right leg: Secondary | ICD-10-CM | POA: Diagnosis not present

## 2019-05-22 DIAGNOSIS — M6281 Muscle weakness (generalized): Secondary | ICD-10-CM | POA: Diagnosis not present

## 2019-05-23 DIAGNOSIS — M79604 Pain in right leg: Secondary | ICD-10-CM | POA: Diagnosis not present

## 2019-05-23 DIAGNOSIS — M6281 Muscle weakness (generalized): Secondary | ICD-10-CM | POA: Diagnosis not present

## 2019-05-23 DIAGNOSIS — R278 Other lack of coordination: Secondary | ICD-10-CM | POA: Diagnosis not present

## 2019-05-23 DIAGNOSIS — I639 Cerebral infarction, unspecified: Secondary | ICD-10-CM | POA: Diagnosis not present

## 2019-05-23 DIAGNOSIS — S72351D Displaced comminuted fracture of shaft of right femur, subsequent encounter for closed fracture with routine healing: Secondary | ICD-10-CM | POA: Diagnosis not present

## 2019-05-23 DIAGNOSIS — Z9181 History of falling: Secondary | ICD-10-CM | POA: Diagnosis not present

## 2019-05-24 DIAGNOSIS — M6281 Muscle weakness (generalized): Secondary | ICD-10-CM | POA: Diagnosis not present

## 2019-05-24 DIAGNOSIS — R278 Other lack of coordination: Secondary | ICD-10-CM | POA: Diagnosis not present

## 2019-05-24 DIAGNOSIS — G4700 Insomnia, unspecified: Secondary | ICD-10-CM | POA: Diagnosis not present

## 2019-05-24 DIAGNOSIS — Z9181 History of falling: Secondary | ICD-10-CM | POA: Diagnosis not present

## 2019-05-24 DIAGNOSIS — F329 Major depressive disorder, single episode, unspecified: Secondary | ICD-10-CM | POA: Diagnosis not present

## 2019-05-24 DIAGNOSIS — F410 Panic disorder [episodic paroxysmal anxiety] without agoraphobia: Secondary | ICD-10-CM | POA: Diagnosis not present

## 2019-05-24 DIAGNOSIS — F4320 Adjustment disorder, unspecified: Secondary | ICD-10-CM | POA: Diagnosis not present

## 2019-05-24 DIAGNOSIS — M79604 Pain in right leg: Secondary | ICD-10-CM | POA: Diagnosis not present

## 2019-05-24 DIAGNOSIS — I639 Cerebral infarction, unspecified: Secondary | ICD-10-CM | POA: Diagnosis not present

## 2019-05-24 DIAGNOSIS — S72351D Displaced comminuted fracture of shaft of right femur, subsequent encounter for closed fracture with routine healing: Secondary | ICD-10-CM | POA: Diagnosis not present

## 2019-05-25 ENCOUNTER — Ambulatory Visit (INDEPENDENT_AMBULATORY_CARE_PROVIDER_SITE_OTHER): Payer: Medicare Other

## 2019-05-25 DIAGNOSIS — S72351D Displaced comminuted fracture of shaft of right femur, subsequent encounter for closed fracture with routine healing: Secondary | ICD-10-CM | POA: Diagnosis not present

## 2019-05-25 DIAGNOSIS — Z9181 History of falling: Secondary | ICD-10-CM | POA: Diagnosis not present

## 2019-05-25 DIAGNOSIS — I639 Cerebral infarction, unspecified: Secondary | ICD-10-CM | POA: Diagnosis not present

## 2019-05-25 DIAGNOSIS — M6281 Muscle weakness (generalized): Secondary | ICD-10-CM | POA: Diagnosis not present

## 2019-05-25 DIAGNOSIS — I4891 Unspecified atrial fibrillation: Secondary | ICD-10-CM

## 2019-05-25 DIAGNOSIS — G459 Transient cerebral ischemic attack, unspecified: Secondary | ICD-10-CM | POA: Diagnosis not present

## 2019-05-25 DIAGNOSIS — R278 Other lack of coordination: Secondary | ICD-10-CM | POA: Diagnosis not present

## 2019-05-25 DIAGNOSIS — M79604 Pain in right leg: Secondary | ICD-10-CM | POA: Diagnosis not present

## 2019-05-26 DIAGNOSIS — M6281 Muscle weakness (generalized): Secondary | ICD-10-CM | POA: Diagnosis not present

## 2019-05-26 DIAGNOSIS — S72351D Displaced comminuted fracture of shaft of right femur, subsequent encounter for closed fracture with routine healing: Secondary | ICD-10-CM | POA: Diagnosis not present

## 2019-05-26 DIAGNOSIS — S72141D Displaced intertrochanteric fracture of right femur, subsequent encounter for closed fracture with routine healing: Secondary | ICD-10-CM | POA: Diagnosis not present

## 2019-05-26 DIAGNOSIS — R278 Other lack of coordination: Secondary | ICD-10-CM | POA: Diagnosis not present

## 2019-05-26 DIAGNOSIS — Z9181 History of falling: Secondary | ICD-10-CM | POA: Diagnosis not present

## 2019-05-26 DIAGNOSIS — I639 Cerebral infarction, unspecified: Secondary | ICD-10-CM | POA: Diagnosis not present

## 2019-05-26 DIAGNOSIS — M79604 Pain in right leg: Secondary | ICD-10-CM | POA: Diagnosis not present

## 2019-05-29 DIAGNOSIS — Z9181 History of falling: Secondary | ICD-10-CM | POA: Diagnosis not present

## 2019-05-29 DIAGNOSIS — M79604 Pain in right leg: Secondary | ICD-10-CM | POA: Diagnosis not present

## 2019-05-29 DIAGNOSIS — S72351D Displaced comminuted fracture of shaft of right femur, subsequent encounter for closed fracture with routine healing: Secondary | ICD-10-CM | POA: Diagnosis not present

## 2019-05-29 DIAGNOSIS — I639 Cerebral infarction, unspecified: Secondary | ICD-10-CM | POA: Diagnosis not present

## 2019-05-29 DIAGNOSIS — Z20828 Contact with and (suspected) exposure to other viral communicable diseases: Secondary | ICD-10-CM | POA: Diagnosis not present

## 2019-05-29 DIAGNOSIS — M6281 Muscle weakness (generalized): Secondary | ICD-10-CM | POA: Diagnosis not present

## 2019-05-29 DIAGNOSIS — R278 Other lack of coordination: Secondary | ICD-10-CM | POA: Diagnosis not present

## 2019-05-30 DIAGNOSIS — S72351D Displaced comminuted fracture of shaft of right femur, subsequent encounter for closed fracture with routine healing: Secondary | ICD-10-CM | POA: Diagnosis not present

## 2019-05-30 DIAGNOSIS — R2681 Unsteadiness on feet: Secondary | ICD-10-CM | POA: Diagnosis not present

## 2019-05-30 DIAGNOSIS — I639 Cerebral infarction, unspecified: Secondary | ICD-10-CM | POA: Diagnosis not present

## 2019-05-30 DIAGNOSIS — M6281 Muscle weakness (generalized): Secondary | ICD-10-CM | POA: Diagnosis not present

## 2019-05-30 DIAGNOSIS — Z9181 History of falling: Secondary | ICD-10-CM | POA: Diagnosis not present

## 2019-05-30 DIAGNOSIS — M79604 Pain in right leg: Secondary | ICD-10-CM | POA: Diagnosis not present

## 2019-05-30 DIAGNOSIS — R278 Other lack of coordination: Secondary | ICD-10-CM | POA: Diagnosis not present

## 2019-05-31 DIAGNOSIS — F4320 Adjustment disorder, unspecified: Secondary | ICD-10-CM | POA: Diagnosis not present

## 2019-05-31 DIAGNOSIS — F410 Panic disorder [episodic paroxysmal anxiety] without agoraphobia: Secondary | ICD-10-CM | POA: Diagnosis not present

## 2019-05-31 DIAGNOSIS — M6281 Muscle weakness (generalized): Secondary | ICD-10-CM | POA: Diagnosis not present

## 2019-05-31 DIAGNOSIS — M79604 Pain in right leg: Secondary | ICD-10-CM | POA: Diagnosis not present

## 2019-05-31 DIAGNOSIS — R2681 Unsteadiness on feet: Secondary | ICD-10-CM | POA: Diagnosis not present

## 2019-05-31 DIAGNOSIS — Z9181 History of falling: Secondary | ICD-10-CM | POA: Diagnosis not present

## 2019-05-31 DIAGNOSIS — I639 Cerebral infarction, unspecified: Secondary | ICD-10-CM | POA: Diagnosis not present

## 2019-05-31 DIAGNOSIS — R278 Other lack of coordination: Secondary | ICD-10-CM | POA: Diagnosis not present

## 2019-05-31 DIAGNOSIS — F329 Major depressive disorder, single episode, unspecified: Secondary | ICD-10-CM | POA: Diagnosis not present

## 2019-05-31 DIAGNOSIS — G4700 Insomnia, unspecified: Secondary | ICD-10-CM | POA: Diagnosis not present

## 2019-06-02 DIAGNOSIS — M79604 Pain in right leg: Secondary | ICD-10-CM | POA: Diagnosis not present

## 2019-06-02 DIAGNOSIS — R278 Other lack of coordination: Secondary | ICD-10-CM | POA: Diagnosis not present

## 2019-06-02 DIAGNOSIS — R2681 Unsteadiness on feet: Secondary | ICD-10-CM | POA: Diagnosis not present

## 2019-06-02 DIAGNOSIS — I639 Cerebral infarction, unspecified: Secondary | ICD-10-CM | POA: Diagnosis not present

## 2019-06-02 DIAGNOSIS — M6281 Muscle weakness (generalized): Secondary | ICD-10-CM | POA: Diagnosis not present

## 2019-06-02 DIAGNOSIS — Z9181 History of falling: Secondary | ICD-10-CM | POA: Diagnosis not present

## 2019-06-05 DIAGNOSIS — M79604 Pain in right leg: Secondary | ICD-10-CM | POA: Diagnosis not present

## 2019-06-05 DIAGNOSIS — R278 Other lack of coordination: Secondary | ICD-10-CM | POA: Diagnosis not present

## 2019-06-05 DIAGNOSIS — I639 Cerebral infarction, unspecified: Secondary | ICD-10-CM | POA: Diagnosis not present

## 2019-06-05 DIAGNOSIS — Z20828 Contact with and (suspected) exposure to other viral communicable diseases: Secondary | ICD-10-CM | POA: Diagnosis not present

## 2019-06-05 DIAGNOSIS — R2681 Unsteadiness on feet: Secondary | ICD-10-CM | POA: Diagnosis not present

## 2019-06-05 DIAGNOSIS — Z9181 History of falling: Secondary | ICD-10-CM | POA: Diagnosis not present

## 2019-06-05 DIAGNOSIS — M6281 Muscle weakness (generalized): Secondary | ICD-10-CM | POA: Diagnosis not present

## 2019-06-06 DIAGNOSIS — R278 Other lack of coordination: Secondary | ICD-10-CM | POA: Diagnosis not present

## 2019-06-06 DIAGNOSIS — M6281 Muscle weakness (generalized): Secondary | ICD-10-CM | POA: Diagnosis not present

## 2019-06-06 DIAGNOSIS — M79604 Pain in right leg: Secondary | ICD-10-CM | POA: Diagnosis not present

## 2019-06-06 DIAGNOSIS — I639 Cerebral infarction, unspecified: Secondary | ICD-10-CM | POA: Diagnosis not present

## 2019-06-06 DIAGNOSIS — R2681 Unsteadiness on feet: Secondary | ICD-10-CM | POA: Diagnosis not present

## 2019-06-06 DIAGNOSIS — Z9181 History of falling: Secondary | ICD-10-CM | POA: Diagnosis not present

## 2019-06-08 DIAGNOSIS — M79604 Pain in right leg: Secondary | ICD-10-CM | POA: Diagnosis not present

## 2019-06-08 DIAGNOSIS — Z9181 History of falling: Secondary | ICD-10-CM | POA: Diagnosis not present

## 2019-06-08 DIAGNOSIS — R2681 Unsteadiness on feet: Secondary | ICD-10-CM | POA: Diagnosis not present

## 2019-06-08 DIAGNOSIS — I639 Cerebral infarction, unspecified: Secondary | ICD-10-CM | POA: Diagnosis not present

## 2019-06-08 DIAGNOSIS — R278 Other lack of coordination: Secondary | ICD-10-CM | POA: Diagnosis not present

## 2019-06-08 DIAGNOSIS — M6281 Muscle weakness (generalized): Secondary | ICD-10-CM | POA: Diagnosis not present

## 2019-06-09 DIAGNOSIS — R278 Other lack of coordination: Secondary | ICD-10-CM | POA: Diagnosis not present

## 2019-06-09 DIAGNOSIS — R2681 Unsteadiness on feet: Secondary | ICD-10-CM | POA: Diagnosis not present

## 2019-06-09 DIAGNOSIS — Z9181 History of falling: Secondary | ICD-10-CM | POA: Diagnosis not present

## 2019-06-09 DIAGNOSIS — M79604 Pain in right leg: Secondary | ICD-10-CM | POA: Diagnosis not present

## 2019-06-09 DIAGNOSIS — I639 Cerebral infarction, unspecified: Secondary | ICD-10-CM | POA: Diagnosis not present

## 2019-06-09 DIAGNOSIS — M6281 Muscle weakness (generalized): Secondary | ICD-10-CM | POA: Diagnosis not present

## 2019-06-12 ENCOUNTER — Ambulatory Visit: Payer: Medicare Other | Admitting: Podiatry

## 2019-06-12 DIAGNOSIS — Z9181 History of falling: Secondary | ICD-10-CM | POA: Diagnosis not present

## 2019-06-12 DIAGNOSIS — M79604 Pain in right leg: Secondary | ICD-10-CM | POA: Diagnosis not present

## 2019-06-12 DIAGNOSIS — I639 Cerebral infarction, unspecified: Secondary | ICD-10-CM | POA: Diagnosis not present

## 2019-06-12 DIAGNOSIS — R2681 Unsteadiness on feet: Secondary | ICD-10-CM | POA: Diagnosis not present

## 2019-06-12 DIAGNOSIS — R278 Other lack of coordination: Secondary | ICD-10-CM | POA: Diagnosis not present

## 2019-06-12 DIAGNOSIS — Z20828 Contact with and (suspected) exposure to other viral communicable diseases: Secondary | ICD-10-CM | POA: Diagnosis not present

## 2019-06-12 DIAGNOSIS — M6281 Muscle weakness (generalized): Secondary | ICD-10-CM | POA: Diagnosis not present

## 2019-06-13 DIAGNOSIS — Z9181 History of falling: Secondary | ICD-10-CM | POA: Diagnosis not present

## 2019-06-13 DIAGNOSIS — R278 Other lack of coordination: Secondary | ICD-10-CM | POA: Diagnosis not present

## 2019-06-13 DIAGNOSIS — M6281 Muscle weakness (generalized): Secondary | ICD-10-CM | POA: Diagnosis not present

## 2019-06-13 DIAGNOSIS — M79604 Pain in right leg: Secondary | ICD-10-CM | POA: Diagnosis not present

## 2019-06-13 DIAGNOSIS — R2681 Unsteadiness on feet: Secondary | ICD-10-CM | POA: Diagnosis not present

## 2019-06-13 DIAGNOSIS — I639 Cerebral infarction, unspecified: Secondary | ICD-10-CM | POA: Diagnosis not present

## 2019-06-14 DIAGNOSIS — M79604 Pain in right leg: Secondary | ICD-10-CM | POA: Diagnosis not present

## 2019-06-14 DIAGNOSIS — R278 Other lack of coordination: Secondary | ICD-10-CM | POA: Diagnosis not present

## 2019-06-14 DIAGNOSIS — M6281 Muscle weakness (generalized): Secondary | ICD-10-CM | POA: Diagnosis not present

## 2019-06-14 DIAGNOSIS — F329 Major depressive disorder, single episode, unspecified: Secondary | ICD-10-CM | POA: Diagnosis not present

## 2019-06-14 DIAGNOSIS — Z9181 History of falling: Secondary | ICD-10-CM | POA: Diagnosis not present

## 2019-06-14 DIAGNOSIS — F4320 Adjustment disorder, unspecified: Secondary | ICD-10-CM | POA: Diagnosis not present

## 2019-06-14 DIAGNOSIS — G4700 Insomnia, unspecified: Secondary | ICD-10-CM | POA: Diagnosis not present

## 2019-06-14 DIAGNOSIS — I639 Cerebral infarction, unspecified: Secondary | ICD-10-CM | POA: Diagnosis not present

## 2019-06-14 DIAGNOSIS — F410 Panic disorder [episodic paroxysmal anxiety] without agoraphobia: Secondary | ICD-10-CM | POA: Diagnosis not present

## 2019-06-14 DIAGNOSIS — R2681 Unsteadiness on feet: Secondary | ICD-10-CM | POA: Diagnosis not present

## 2019-06-15 DIAGNOSIS — M79604 Pain in right leg: Secondary | ICD-10-CM | POA: Diagnosis not present

## 2019-06-15 DIAGNOSIS — M6281 Muscle weakness (generalized): Secondary | ICD-10-CM | POA: Diagnosis not present

## 2019-06-15 DIAGNOSIS — Z9181 History of falling: Secondary | ICD-10-CM | POA: Diagnosis not present

## 2019-06-15 DIAGNOSIS — R2681 Unsteadiness on feet: Secondary | ICD-10-CM | POA: Diagnosis not present

## 2019-06-15 DIAGNOSIS — R278 Other lack of coordination: Secondary | ICD-10-CM | POA: Diagnosis not present

## 2019-06-15 DIAGNOSIS — I639 Cerebral infarction, unspecified: Secondary | ICD-10-CM | POA: Diagnosis not present

## 2019-06-16 DIAGNOSIS — I639 Cerebral infarction, unspecified: Secondary | ICD-10-CM | POA: Diagnosis not present

## 2019-06-16 DIAGNOSIS — M6281 Muscle weakness (generalized): Secondary | ICD-10-CM | POA: Diagnosis not present

## 2019-06-16 DIAGNOSIS — M79604 Pain in right leg: Secondary | ICD-10-CM | POA: Diagnosis not present

## 2019-06-16 DIAGNOSIS — Z9181 History of falling: Secondary | ICD-10-CM | POA: Diagnosis not present

## 2019-06-16 DIAGNOSIS — R278 Other lack of coordination: Secondary | ICD-10-CM | POA: Diagnosis not present

## 2019-06-16 DIAGNOSIS — R2681 Unsteadiness on feet: Secondary | ICD-10-CM | POA: Diagnosis not present

## 2019-06-19 DIAGNOSIS — M79604 Pain in right leg: Secondary | ICD-10-CM | POA: Diagnosis not present

## 2019-06-19 DIAGNOSIS — R278 Other lack of coordination: Secondary | ICD-10-CM | POA: Diagnosis not present

## 2019-06-19 DIAGNOSIS — A0472 Enterocolitis due to Clostridium difficile, not specified as recurrent: Secondary | ICD-10-CM | POA: Diagnosis not present

## 2019-06-19 DIAGNOSIS — Z9181 History of falling: Secondary | ICD-10-CM | POA: Diagnosis not present

## 2019-06-19 DIAGNOSIS — R69 Illness, unspecified: Secondary | ICD-10-CM | POA: Diagnosis not present

## 2019-06-19 DIAGNOSIS — M6281 Muscle weakness (generalized): Secondary | ICD-10-CM | POA: Diagnosis not present

## 2019-06-19 DIAGNOSIS — I639 Cerebral infarction, unspecified: Secondary | ICD-10-CM | POA: Diagnosis not present

## 2019-06-19 DIAGNOSIS — R2681 Unsteadiness on feet: Secondary | ICD-10-CM | POA: Diagnosis not present

## 2019-06-20 DIAGNOSIS — R278 Other lack of coordination: Secondary | ICD-10-CM | POA: Diagnosis not present

## 2019-06-20 DIAGNOSIS — Z9181 History of falling: Secondary | ICD-10-CM | POA: Diagnosis not present

## 2019-06-20 DIAGNOSIS — I639 Cerebral infarction, unspecified: Secondary | ICD-10-CM | POA: Diagnosis not present

## 2019-06-20 DIAGNOSIS — M79604 Pain in right leg: Secondary | ICD-10-CM | POA: Diagnosis not present

## 2019-06-20 DIAGNOSIS — R2681 Unsteadiness on feet: Secondary | ICD-10-CM | POA: Diagnosis not present

## 2019-06-20 DIAGNOSIS — M6281 Muscle weakness (generalized): Secondary | ICD-10-CM | POA: Diagnosis not present

## 2019-06-21 DIAGNOSIS — R2681 Unsteadiness on feet: Secondary | ICD-10-CM | POA: Diagnosis not present

## 2019-06-21 DIAGNOSIS — Z9181 History of falling: Secondary | ICD-10-CM | POA: Diagnosis not present

## 2019-06-21 DIAGNOSIS — M6281 Muscle weakness (generalized): Secondary | ICD-10-CM | POA: Diagnosis not present

## 2019-06-21 DIAGNOSIS — I639 Cerebral infarction, unspecified: Secondary | ICD-10-CM | POA: Diagnosis not present

## 2019-06-21 DIAGNOSIS — R278 Other lack of coordination: Secondary | ICD-10-CM | POA: Diagnosis not present

## 2019-06-21 DIAGNOSIS — M79604 Pain in right leg: Secondary | ICD-10-CM | POA: Diagnosis not present

## 2019-06-22 DIAGNOSIS — M6281 Muscle weakness (generalized): Secondary | ICD-10-CM | POA: Diagnosis not present

## 2019-06-22 DIAGNOSIS — I639 Cerebral infarction, unspecified: Secondary | ICD-10-CM | POA: Diagnosis not present

## 2019-06-22 DIAGNOSIS — M79604 Pain in right leg: Secondary | ICD-10-CM | POA: Diagnosis not present

## 2019-06-22 DIAGNOSIS — R2681 Unsteadiness on feet: Secondary | ICD-10-CM | POA: Diagnosis not present

## 2019-06-22 DIAGNOSIS — Z9181 History of falling: Secondary | ICD-10-CM | POA: Diagnosis not present

## 2019-06-22 DIAGNOSIS — R278 Other lack of coordination: Secondary | ICD-10-CM | POA: Diagnosis not present

## 2019-06-23 DIAGNOSIS — I639 Cerebral infarction, unspecified: Secondary | ICD-10-CM | POA: Diagnosis not present

## 2019-06-23 DIAGNOSIS — R278 Other lack of coordination: Secondary | ICD-10-CM | POA: Diagnosis not present

## 2019-06-23 DIAGNOSIS — Z9181 History of falling: Secondary | ICD-10-CM | POA: Diagnosis not present

## 2019-06-23 DIAGNOSIS — M6281 Muscle weakness (generalized): Secondary | ICD-10-CM | POA: Diagnosis not present

## 2019-06-23 DIAGNOSIS — M79604 Pain in right leg: Secondary | ICD-10-CM | POA: Diagnosis not present

## 2019-06-23 DIAGNOSIS — R2681 Unsteadiness on feet: Secondary | ICD-10-CM | POA: Diagnosis not present

## 2019-06-26 DIAGNOSIS — Z9181 History of falling: Secondary | ICD-10-CM | POA: Diagnosis not present

## 2019-06-26 DIAGNOSIS — M79604 Pain in right leg: Secondary | ICD-10-CM | POA: Diagnosis not present

## 2019-06-26 DIAGNOSIS — R2681 Unsteadiness on feet: Secondary | ICD-10-CM | POA: Diagnosis not present

## 2019-06-26 DIAGNOSIS — R278 Other lack of coordination: Secondary | ICD-10-CM | POA: Diagnosis not present

## 2019-06-26 DIAGNOSIS — I639 Cerebral infarction, unspecified: Secondary | ICD-10-CM | POA: Diagnosis not present

## 2019-06-26 DIAGNOSIS — M6281 Muscle weakness (generalized): Secondary | ICD-10-CM | POA: Diagnosis not present

## 2019-06-27 DIAGNOSIS — Z9181 History of falling: Secondary | ICD-10-CM | POA: Diagnosis not present

## 2019-06-27 DIAGNOSIS — I639 Cerebral infarction, unspecified: Secondary | ICD-10-CM | POA: Diagnosis not present

## 2019-06-27 DIAGNOSIS — M79604 Pain in right leg: Secondary | ICD-10-CM | POA: Diagnosis not present

## 2019-06-27 DIAGNOSIS — R278 Other lack of coordination: Secondary | ICD-10-CM | POA: Diagnosis not present

## 2019-06-27 DIAGNOSIS — M6281 Muscle weakness (generalized): Secondary | ICD-10-CM | POA: Diagnosis not present

## 2019-06-27 DIAGNOSIS — R2681 Unsteadiness on feet: Secondary | ICD-10-CM | POA: Diagnosis not present

## 2019-06-28 DIAGNOSIS — M79604 Pain in right leg: Secondary | ICD-10-CM | POA: Diagnosis not present

## 2019-06-28 DIAGNOSIS — I639 Cerebral infarction, unspecified: Secondary | ICD-10-CM | POA: Diagnosis not present

## 2019-06-28 DIAGNOSIS — M6281 Muscle weakness (generalized): Secondary | ICD-10-CM | POA: Diagnosis not present

## 2019-06-28 DIAGNOSIS — R278 Other lack of coordination: Secondary | ICD-10-CM | POA: Diagnosis not present

## 2019-06-28 DIAGNOSIS — R2681 Unsteadiness on feet: Secondary | ICD-10-CM | POA: Diagnosis not present

## 2019-06-28 DIAGNOSIS — Z9181 History of falling: Secondary | ICD-10-CM | POA: Diagnosis not present

## 2019-06-29 ENCOUNTER — Telehealth: Payer: Self-pay | Admitting: *Deleted

## 2019-06-29 DIAGNOSIS — M6281 Muscle weakness (generalized): Secondary | ICD-10-CM | POA: Diagnosis not present

## 2019-06-29 DIAGNOSIS — Z7984 Long term (current) use of oral hypoglycemic drugs: Secondary | ICD-10-CM | POA: Diagnosis not present

## 2019-06-29 DIAGNOSIS — I639 Cerebral infarction, unspecified: Secondary | ICD-10-CM | POA: Diagnosis not present

## 2019-06-29 DIAGNOSIS — B351 Tinea unguium: Secondary | ICD-10-CM | POA: Diagnosis not present

## 2019-06-29 DIAGNOSIS — E1151 Type 2 diabetes mellitus with diabetic peripheral angiopathy without gangrene: Secondary | ICD-10-CM | POA: Diagnosis not present

## 2019-06-29 DIAGNOSIS — M79604 Pain in right leg: Secondary | ICD-10-CM | POA: Diagnosis not present

## 2019-06-29 DIAGNOSIS — R278 Other lack of coordination: Secondary | ICD-10-CM | POA: Diagnosis not present

## 2019-06-29 DIAGNOSIS — S72351D Displaced comminuted fracture of shaft of right femur, subsequent encounter for closed fracture with routine healing: Secondary | ICD-10-CM | POA: Diagnosis not present

## 2019-06-29 DIAGNOSIS — Z9181 History of falling: Secondary | ICD-10-CM | POA: Diagnosis not present

## 2019-06-29 NOTE — Telephone Encounter (Signed)
Shirley from radiation call and scheduled the patient for an appt. Enid Derry will contact the patient

## 2019-06-29 NOTE — Telephone Encounter (Signed)
CALLED PATIENT TO INFORM OF FU WITH DR. Denman George ON 08-28-19 - ARRIVAL TIME - 2 PM, LVM FOR A RETURN CALL

## 2019-06-30 DIAGNOSIS — R278 Other lack of coordination: Secondary | ICD-10-CM | POA: Diagnosis not present

## 2019-06-30 DIAGNOSIS — M79604 Pain in right leg: Secondary | ICD-10-CM | POA: Diagnosis not present

## 2019-06-30 DIAGNOSIS — Z9181 History of falling: Secondary | ICD-10-CM | POA: Diagnosis not present

## 2019-06-30 DIAGNOSIS — M6281 Muscle weakness (generalized): Secondary | ICD-10-CM | POA: Diagnosis not present

## 2019-06-30 DIAGNOSIS — I639 Cerebral infarction, unspecified: Secondary | ICD-10-CM | POA: Diagnosis not present

## 2019-06-30 DIAGNOSIS — S72351D Displaced comminuted fracture of shaft of right femur, subsequent encounter for closed fracture with routine healing: Secondary | ICD-10-CM | POA: Diagnosis not present

## 2019-07-04 DIAGNOSIS — S72351D Displaced comminuted fracture of shaft of right femur, subsequent encounter for closed fracture with routine healing: Secondary | ICD-10-CM | POA: Diagnosis not present

## 2019-07-04 DIAGNOSIS — R278 Other lack of coordination: Secondary | ICD-10-CM | POA: Diagnosis not present

## 2019-07-04 DIAGNOSIS — M79604 Pain in right leg: Secondary | ICD-10-CM | POA: Diagnosis not present

## 2019-07-04 DIAGNOSIS — Z9181 History of falling: Secondary | ICD-10-CM | POA: Diagnosis not present

## 2019-07-04 DIAGNOSIS — M6281 Muscle weakness (generalized): Secondary | ICD-10-CM | POA: Diagnosis not present

## 2019-07-04 DIAGNOSIS — I639 Cerebral infarction, unspecified: Secondary | ICD-10-CM | POA: Diagnosis not present

## 2019-07-05 DIAGNOSIS — M6281 Muscle weakness (generalized): Secondary | ICD-10-CM | POA: Diagnosis not present

## 2019-07-05 DIAGNOSIS — M79604 Pain in right leg: Secondary | ICD-10-CM | POA: Diagnosis not present

## 2019-07-05 DIAGNOSIS — I639 Cerebral infarction, unspecified: Secondary | ICD-10-CM | POA: Diagnosis not present

## 2019-07-05 DIAGNOSIS — R278 Other lack of coordination: Secondary | ICD-10-CM | POA: Diagnosis not present

## 2019-07-05 DIAGNOSIS — Z9181 History of falling: Secondary | ICD-10-CM | POA: Diagnosis not present

## 2019-07-05 DIAGNOSIS — S72351D Displaced comminuted fracture of shaft of right femur, subsequent encounter for closed fracture with routine healing: Secondary | ICD-10-CM | POA: Diagnosis not present

## 2019-07-06 ENCOUNTER — Other Ambulatory Visit: Payer: Self-pay | Admitting: Cardiology

## 2019-07-06 DIAGNOSIS — I639 Cerebral infarction, unspecified: Secondary | ICD-10-CM

## 2019-07-06 DIAGNOSIS — I4891 Unspecified atrial fibrillation: Secondary | ICD-10-CM

## 2019-07-07 DIAGNOSIS — M6281 Muscle weakness (generalized): Secondary | ICD-10-CM | POA: Diagnosis not present

## 2019-07-07 DIAGNOSIS — R278 Other lack of coordination: Secondary | ICD-10-CM | POA: Diagnosis not present

## 2019-07-07 DIAGNOSIS — I639 Cerebral infarction, unspecified: Secondary | ICD-10-CM | POA: Diagnosis not present

## 2019-07-07 DIAGNOSIS — Z9181 History of falling: Secondary | ICD-10-CM | POA: Diagnosis not present

## 2019-07-07 DIAGNOSIS — M79604 Pain in right leg: Secondary | ICD-10-CM | POA: Diagnosis not present

## 2019-07-07 DIAGNOSIS — S72351D Displaced comminuted fracture of shaft of right femur, subsequent encounter for closed fracture with routine healing: Secondary | ICD-10-CM | POA: Diagnosis not present

## 2019-07-12 DIAGNOSIS — Z9181 History of falling: Secondary | ICD-10-CM | POA: Diagnosis not present

## 2019-07-12 DIAGNOSIS — R278 Other lack of coordination: Secondary | ICD-10-CM | POA: Diagnosis not present

## 2019-07-12 DIAGNOSIS — M6281 Muscle weakness (generalized): Secondary | ICD-10-CM | POA: Diagnosis not present

## 2019-07-12 DIAGNOSIS — Z20828 Contact with and (suspected) exposure to other viral communicable diseases: Secondary | ICD-10-CM | POA: Diagnosis not present

## 2019-07-12 DIAGNOSIS — I639 Cerebral infarction, unspecified: Secondary | ICD-10-CM | POA: Diagnosis not present

## 2019-07-12 DIAGNOSIS — M79604 Pain in right leg: Secondary | ICD-10-CM | POA: Diagnosis not present

## 2019-07-12 DIAGNOSIS — S72351D Displaced comminuted fracture of shaft of right femur, subsequent encounter for closed fracture with routine healing: Secondary | ICD-10-CM | POA: Diagnosis not present

## 2019-07-13 DIAGNOSIS — S72351D Displaced comminuted fracture of shaft of right femur, subsequent encounter for closed fracture with routine healing: Secondary | ICD-10-CM | POA: Diagnosis not present

## 2019-07-13 DIAGNOSIS — M6281 Muscle weakness (generalized): Secondary | ICD-10-CM | POA: Diagnosis not present

## 2019-07-13 DIAGNOSIS — I639 Cerebral infarction, unspecified: Secondary | ICD-10-CM | POA: Diagnosis not present

## 2019-07-13 DIAGNOSIS — R278 Other lack of coordination: Secondary | ICD-10-CM | POA: Diagnosis not present

## 2019-07-13 DIAGNOSIS — M79604 Pain in right leg: Secondary | ICD-10-CM | POA: Diagnosis not present

## 2019-07-13 DIAGNOSIS — Z9181 History of falling: Secondary | ICD-10-CM | POA: Diagnosis not present

## 2019-07-18 DIAGNOSIS — Z20828 Contact with and (suspected) exposure to other viral communicable diseases: Secondary | ICD-10-CM | POA: Diagnosis not present

## 2019-07-26 DIAGNOSIS — F329 Major depressive disorder, single episode, unspecified: Secondary | ICD-10-CM | POA: Diagnosis not present

## 2019-07-26 DIAGNOSIS — F411 Generalized anxiety disorder: Secondary | ICD-10-CM | POA: Diagnosis not present

## 2019-07-26 DIAGNOSIS — F410 Panic disorder [episodic paroxysmal anxiety] without agoraphobia: Secondary | ICD-10-CM | POA: Diagnosis not present

## 2019-07-27 ENCOUNTER — Telehealth: Payer: Self-pay

## 2019-07-27 NOTE — Telephone Encounter (Signed)
Notes recorded by Marval Regal, RN on 07/27/2019 at 3:34 PM EDT  I called pts niece [name] and stated the cardiac event monitor shows no significant arrhythmias. She verbalized understanding and stated pt is still in nursing home at [facility]..  ------

## 2019-07-27 NOTE — Telephone Encounter (Signed)
-----   Message from Garvin Fila, MD sent at 07/25/2019  6:35 PM EDT ----- Cardiac event monitor shows no significant arrythmias

## 2019-07-28 DIAGNOSIS — F411 Generalized anxiety disorder: Secondary | ICD-10-CM | POA: Diagnosis not present

## 2019-07-28 DIAGNOSIS — F320 Major depressive disorder, single episode, mild: Secondary | ICD-10-CM | POA: Diagnosis not present

## 2019-07-28 DIAGNOSIS — S72141D Displaced intertrochanteric fracture of right femur, subsequent encounter for closed fracture with routine healing: Secondary | ICD-10-CM | POA: Diagnosis not present

## 2019-08-03 DIAGNOSIS — Z961 Presence of intraocular lens: Secondary | ICD-10-CM | POA: Diagnosis not present

## 2019-08-03 DIAGNOSIS — H04123 Dry eye syndrome of bilateral lacrimal glands: Secondary | ICD-10-CM | POA: Diagnosis not present

## 2019-08-03 DIAGNOSIS — I10 Essential (primary) hypertension: Secondary | ICD-10-CM | POA: Diagnosis not present

## 2019-08-04 DIAGNOSIS — F411 Generalized anxiety disorder: Secondary | ICD-10-CM | POA: Diagnosis not present

## 2019-08-07 DIAGNOSIS — E039 Hypothyroidism, unspecified: Secondary | ICD-10-CM | POA: Diagnosis not present

## 2019-08-07 DIAGNOSIS — Z79899 Other long term (current) drug therapy: Secondary | ICD-10-CM | POA: Diagnosis not present

## 2019-08-07 DIAGNOSIS — R69 Illness, unspecified: Secondary | ICD-10-CM | POA: Diagnosis not present

## 2019-08-07 DIAGNOSIS — D649 Anemia, unspecified: Secondary | ICD-10-CM | POA: Diagnosis not present

## 2019-08-07 DIAGNOSIS — E119 Type 2 diabetes mellitus without complications: Secondary | ICD-10-CM | POA: Diagnosis not present

## 2019-08-07 DIAGNOSIS — I509 Heart failure, unspecified: Secondary | ICD-10-CM | POA: Diagnosis not present

## 2019-08-07 DIAGNOSIS — E785 Hyperlipidemia, unspecified: Secondary | ICD-10-CM | POA: Diagnosis not present

## 2019-08-09 DIAGNOSIS — F329 Major depressive disorder, single episode, unspecified: Secondary | ICD-10-CM | POA: Diagnosis not present

## 2019-08-09 DIAGNOSIS — F411 Generalized anxiety disorder: Secondary | ICD-10-CM | POA: Diagnosis not present

## 2019-08-09 DIAGNOSIS — G4700 Insomnia, unspecified: Secondary | ICD-10-CM | POA: Diagnosis not present

## 2019-08-09 DIAGNOSIS — F410 Panic disorder [episodic paroxysmal anxiety] without agoraphobia: Secondary | ICD-10-CM | POA: Diagnosis not present

## 2019-08-18 DIAGNOSIS — F411 Generalized anxiety disorder: Secondary | ICD-10-CM | POA: Diagnosis not present

## 2019-08-27 DIAGNOSIS — Z20828 Contact with and (suspected) exposure to other viral communicable diseases: Secondary | ICD-10-CM | POA: Diagnosis not present

## 2019-08-28 ENCOUNTER — Inpatient Hospital Stay: Payer: Medicare Other | Attending: Gynecologic Oncology | Admitting: Gynecologic Oncology

## 2019-08-28 NOTE — Progress Notes (Deleted)
Follow-up Note: Gyn-Onc  Consult was initially requested by Dr. Benjie Karvonen for the evaluation of Melinda Kerr 84 y.o. female  CC:  No chief complaint on file.   Assessment/Plan:  Melinda Kerr  is a 84 y.o.  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  Hx of diverticular abscesses in October 2018.  Continue 6 monthly surveillance until February, 2023.  She will see ***.  HPI: Melinda Kerr is a 84 year old G0 who is seen in consultation of Dr Benjie Karvonen for serous endometrial cancer in the setting of morbid obesity (BMI 44). The patient reports a history of postmenopausal bleeding on 01/28/16. She then saw Dr Benjie Karvonen who performed a TVUS on 02/13/16 which revealed 6.2 x 5.3 x 4cm and a thickened endometrial stripe. There was an echogenic mass measuring 22.8cm felt to represent a dermoid cyst on the left ovary. The right ovary was normal. A D&C was performed on 03/04/16 and showed high grade serous carcinoma.   She has morbid obesity with a BMI of 44kg/m2. She has HTN, and a remote history of a spontaneous DVT treated with 6 months of coumadin. She has chronic back pain and scoliosis. The patient's surgical history is most notable for a laparoscopic cholecystectomy and an umbilical hernia repair without mesh in 2007.  Prior to surgery she underwent CT staging with a CT abdo/pelvis and chest. This showed some suspicious/borderline appearing nodes in her pelvic lymph nodes and at the left PA region (1.1cm in largest dimension).   On 03/26/16 she underwent robotic assisted total hysterectomy, BSO and SLN biopsy. Intraoperative findings were significant for a clinically suspicious left  obturator SLN. Final pathology confirmed a stage IIIC1 high grade serous carcinoma of the endometrium with a 3.8cm tumor invading 1.5 of 1.8cm of myometrial thickness with LVSI present. The ovaries and cervix were free of tumor, however the left obturator SLN contained a macrometastatic (>85mm) focus of disease.   She completed 6 cycles of chemotherapy (carboplatin and paclitaxel) between 05-04-16 thru 08-10-16. Treatment was complicated by bone marrow suppression, bone pains.  CT scan on 09/08/16 showed no gross metastases or recurrence.  She completed vaginal brachytherapy with Dr Sondra Come with in February, 2018.   On 07/13/17 she was admitted to [facility] with diverticulitis and diverticular abscesses seen on CT periappendiceal and on the left pelvis. There was no apparent metastatic recurrent disease. She was treated with antibiotics and has done well.   Interval Hx: She has no complaints today other than fatigue.  Current Meds:  Outpatient Encounter Medications as of 08/28/2019  Medication Sig  . acetaminophen (TYLENOL) 325 MG tablet Take 2 tablets (650 mg total) by mouth every 6 (six) hours as needed for mild pain or moderate pain (pain score 1-3 or temp > 100.5).  Marland Kitchen albuterol (VENTOLIN HFA) 108 (90 Base) MCG/ACT inhaler Inhale 2 puffs into the lungs every 6 (six) hours as needed for wheezing or shortness of breath.  Marland Kitchen alendronate (FOSAMAX) 70 MG tablet Take 70 mg by mouth every Friday. Take with a full glass of water on an empty stomach.   Marland Kitchen allopurinol (ZYLOPRIM) 300 MG tablet Take 300 mg by mouth daily.   . baclofen (LIORESAL) 10 MG tablet Take 1 tablet (  10 mg total) by mouth 3 (three) times daily as needed for muscle spasms.  . calcium carbonate (TUMS - DOSED IN MG ELEMENTAL CALCIUM) 500 MG chewable tablet Chew 1 tablet by mouth daily.  . cetirizine (ZYRTEC) 10 MG tablet Take 10 mg by mouth daily.  . citalopram (CELEXA) 10 MG tablet Take 10 mg by mouth daily.  . citalopram (CELEXA) 20  MG tablet Take 20 mg by mouth daily.   Marland Kitchen docusate sodium (COLACE) 100 MG capsule Take 1 capsule (100 mg total) by mouth 2 (two) times daily.  . furosemide (LASIX) 40 MG tablet Take 40 mg by mouth daily.   Marland Kitchen HYDROcodone-acetaminophen (NORCO) 5-325 MG tablet Take 1-2 tablets by mouth every 6 (six) hours as needed for severe pain (Use tylenol for mild and moderate pain).  . hydrOXYzine (ATARAX/VISTARIL) 25 MG tablet Take 25 mg by mouth at bedtime.  . iron polysaccharides (NIFEREX) 150 MG capsule Take 150 mg by mouth daily.  Marland Kitchen levothyroxine (SYNTHROID, LEVOTHROID) 125 MCG tablet Take 125 mcg by mouth daily at 6 (six) AM.   . magnesium hydroxide (MILK OF MAGNESIA) 400 MG/5ML suspension Take 30 mLs by mouth daily as needed for mild constipation.  . Melatonin 3 MG TABS Take 3 mg by mouth at bedtime.   . metoCLOPramide (REGLAN) 5 MG tablet Take 5 mg by mouth 4 (four) times daily.  Marland Kitchen nystatin (NYSTATIN) powder Apply 1 Bottle topically See admin instructions. Apply topically to abdominal skin folds and under bilateral breast for intertrigo - twice daily - please make sure skin has been cleaned and is dry before applying  . Omega-3 Fatty Acids (FISH OIL) 1000 MG CAPS Take 1,000 mg by mouth daily.   . OXYGEN Inhale 2 L into the lungs continuous. To maintain O2 sat greater then 90% every shift  . pantoprazole (PROTONIX) 40 MG tablet Take 1 tablet (40 mg total) by mouth 2 (two) times daily before a meal.  . polyethylene glycol (MIRALAX / GLYCOLAX) packet Take 17 g by mouth daily as needed for mild constipation or moderate constipation. (Patient taking differently: Take 17 g by mouth 2 (two) times daily. )  . pravastatin (PRAVACHOL) 10 MG tablet Take 10 mg by mouth every Thursday. At bedtime  . pyridOXINE (VITAMIN B-6) 100 MG tablet Take 100 mg by mouth daily.  Marland Kitchen saccharomyces boulardii (FLORASTOR) 250 MG capsule Take 250 mg by mouth 2 (two) times daily.  Marland Kitchen senna-docusate (SENOKOT-S) 8.6-50 MG tablet Take 1  tablet by mouth daily.  Marland Kitchen tolterodine (DETROL LA) 4 MG 24 hr capsule Take 4 mg by mouth every evening.   . vitamin B-12 (CYANOCOBALAMIN) 1000 MCG tablet Take 1,000 mcg by mouth daily.   . vitamin C (ASCORBIC ACID) 500 MG tablet Take 500 mg by mouth daily.  . Vitamin D, Ergocalciferol, (DRISDOL) 1.25 MG (50000 UT) CAPS capsule Take 50,000 Units by mouth every 7 (seven) days. Monday  . zolpidem (AMBIEN) 10 MG tablet Take 10 mg by mouth at bedtime.   No facility-administered encounter medications on file as of 08/28/2019.     Allergy:  Allergies  Allergen Reactions  . Oxybutynin Other (See Comments)    Bleeding gums, felt sick    Social Hx:   Social History   Socioeconomic History  . Marital status: Widowed    Spouse name: Not on file  . Number of children: 0  . Years of education: Not on file  . Highest education level: Not on file  Occupational History  .  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 Financial resource strain: Not on file  . Food insecurity    Worry: Not on file    Inability: Not on file  . Transportation needs    Medical: Not on file    Non-medical: Not on file  Tobacco Use  . Smoking status: Never Smoker  . Smokeless tobacco: Never Used  Substance and Sexual Activity  . Alcohol use: No  . Drug use: No  . Sexual activity: Never  Lifestyle  . Physical activity    Days per week: Not on file    Minutes per session: Not on file  . Stress: Not on file  Relationships  . Social Herbalist on phone: Not on file    Gets together: Not on file    Attends religious service: Not on file    Active member of club or organization: Not on file    Attends meetings of clubs or organizations: Not on file    Relationship status: Not on file  . Intimate partner violence    Fear of current or ex partner: Not on file    Emotionally abused: Not on file    Physically abused: Not on file    Forced sexual activity: Not on file  Other Topics Concern   . Not on file  Social History Narrative  . Not on file    Past Surgical Hx:  Past Surgical History:  Procedure Laterality Date  . APPENDECTOMY    . CATARACT EXTRACTION, BILATERAL Bilateral 2018  . COLONOSCOPY    . DILATATION & CURETTAGE/HYSTEROSCOPY WITH MYOSURE N/A 03/04/2016   Procedure: DILATATION & CURETTAGE/HYSTEROSCOPY ;  Surgeon: Azucena Fallen, MD;  Location: [facility];  Service: Gynecology;  Laterality: N/A;  polyp removal   . ESOPHAGOGASTRODUODENOSCOPY (EGD) WITH PROPOFOL N/A 11/14/2018   Procedure: ESOPHAGOGASTRODUODENOSCOPY (EGD) WITH PROPOFOL;  Surgeon: Wonda Horner, MD;  Location: [facility];  Service: Endoscopy;  Laterality: N/A;  . FEMUR IM NAIL Right 04/09/2019   Procedure: INTRAMEDULLARY (IM) NAIL INTERTROCH;  Surgeon: Renette Butters, MD;  Location: [place];  Service: Orthopedics;  Laterality: Right;  . HERNIA REPAIR    . IR GENERIC HISTORICAL  05/15/2016   IR US GUIDE VASC ACCESS RIGHT 05/15/2016 Aletta Edouard, MD [facility]  . IR GENERIC HISTORICAL  05/15/2016   IR FLUORO GUIDE CV LINE RIGHT 05/15/2016 Aletta Edouard, MD [facility]  . IR REMOVAL TUN ACCESS W/ PORT W/O FL MOD SED  04/19/2018  . LAPAROSCOPIC CHOLECYSTECTOMY  2004  . LAPAROSCOPIC INCISIONAL / UMBILICAL / VENTRAL HERNIA REPAIR  2004   "w/gallbladder OR"  . LYMPH NODE BIOPSY N/A 03/26/2016   Procedure: [place] LYMPH NODE BIOPSY;  Surgeon: Everitt Amber, MD;  Location: [facility];  Service: Gynecology;  Laterality: N/A;  . REVERSE SHOULDER ARTHROPLASTY Right 10/26/2018   Procedure: REVERSE SHOULDER ARTHROPLASTY;  Surgeon: Nicholes Stairs, MD;  Location: [place];  Service: Orthopedics;  Laterality: Right;  . ROBOTIC ASSISTED TOTAL HYSTERECTOMY WITH BILATERAL SALPINGO OOPHERECTOMY N/A 03/26/2016   Procedure: XI ROBOTIC ASSISTED TOTAL Laproscopic HYSTERECTOMY WITH BILATERAL SALPINGO OOPHORECTOMY;  Surgeon: Everitt Amber, MD;  Location: [facility];  Service: Gynecology;  Laterality: N/A;  . TOTAL KNEE ARTHROPLASTY Left  10/09/2013   Procedure: LEFT TOTAL KNEE ARTHROPLASTY;  Surgeon: [name], MD;  Location: [facility];  Service: Orthopedics;  Laterality: Left;  . TOTAL KNEE ARTHROPLASTY Right 04/09/2014   Procedure: RIGHT TOTAL KNEE ARTHROPLASTY;  Surgeon: [name],  MD;  Location: [facility];  Service: Orthopedics;  Laterality: Right;  . TUBAL LIGATION  1982  . VARICOSE VEIN SURGERY Bilateral 1968  . WISDOM TOOTH EXTRACTION  1963    Past Medical Hx:  Past Medical History:  Diagnosis Date  . Arthritis    "shoulders, knees, lower back" (07/14/2017)  . CHF (congestive heart failure) ([place])    "while in hospital w/hysterectomy in 2017"  . Chronic lower back pain   . Depression   . Diverticulitis of large intestine with abscess 07/13/2017  . DVT (deep venous thrombosis) ([place])   . Dyspnea    "cause I'm over weight"  . Esophageal reflux   . Gout   . Hyperlipidemia    under control  . Hypertension   . Hypothyroidism   . Morbid obesity with BMI of 40.0-44.9, adult ([place])   . Nocturia   . OSA on CPAP    "not wearing it when I sleep in my lift chair" (07/14/2017)  . Osteoarthritis   . Phlebitis    hx of  . Pneumonia ?1989  . Spondylosis    with scoliosis  . Type II diabetes mellitus (HCC)    borderline , diet controlled   . Urinary frequency   . Uterine cancer (HCC)    S/P chemo, radiation, hysterectomy    Past Gynecological History:  G0  No LMP recorded. Patient has had a hysterectomy.  Family Hx:  Family History  Problem Relation Age of Onset  . Heart disease Mother   . Heart disease Father   . CVA Father     Review of Systems:  Constitutional  Feels well,  + fatigue  ENT Normal appearing ears and nares bilaterally Skin/Breast  No rash, sores, jaundice, itching, dryness Cardiovascular  No chest pain, shortness of breath, or edema  Pulmonary  No cough or wheeze.  Gastro Intestinal  No nausea, vomitting, or diarrhoea. No bright red blood per rectum, no abdominal pain,  change in bowel movement, or constipation.  Genito Urinary  No frequency, urgency, dysuria, no bleeding Musculo Skeletal  No myalgia, arthralgia, joint swelling or pain  Neurologic  No weakness, numbness, change in gait,  Psychology  No depression, anxiety, insomnia.   Vitals:  There were no vitals taken for this visit.  Physical Exam: WD in NAD Neck  Supple NROM, without any enlargements.  Lymph Node Survey No cervical supraclavicular or inguinal adenopathy Cardiovascular  Pulse normal rate, regularity and rhythm. S1 and S2 normal.  Lungs  Clear to auscultation bilateraly. + audible wheezing on exertion. Increased work of breathing when positioning on bed. Good air movement.  Skin  No rash/lesions/breakdown  Psychiatry  Alert and oriented to person, place, and time  Abdomen  Normoactive bowel sounds, abdomen soft, non-tender and obese with pannus folds without evidence of hernia. Well healed incision sites. Back No CVA tenderness Genito Urinary  Vulva/vagina: Normal external female genitalia.  No lesions. No discharge or bleeding.  Bladder/urethra:  No lesions or masses, well supported bladder  Surgically absent cervix and uterus with no masses or lesions.  Vaginal cuff with no lesions  No pelvic masses Rectal  deferred Extremities  No bilateral cyanosis, clubbing or edema.   Thereasa Solo, MD  08/28/2019, 2:59 PM

## 2019-08-30 DIAGNOSIS — Z79899 Other long term (current) drug therapy: Secondary | ICD-10-CM | POA: Diagnosis not present

## 2019-08-30 DIAGNOSIS — I509 Heart failure, unspecified: Secondary | ICD-10-CM | POA: Diagnosis not present

## 2019-08-30 DIAGNOSIS — Z03818 Encounter for observation for suspected exposure to other biological agents ruled out: Secondary | ICD-10-CM | POA: Diagnosis not present

## 2019-08-30 DIAGNOSIS — D649 Anemia, unspecified: Secondary | ICD-10-CM | POA: Diagnosis not present

## 2019-08-30 DIAGNOSIS — U071 COVID-19: Secondary | ICD-10-CM | POA: Diagnosis not present

## 2019-08-30 DIAGNOSIS — R69 Illness, unspecified: Secondary | ICD-10-CM | POA: Diagnosis not present

## 2019-08-31 DIAGNOSIS — R69 Illness, unspecified: Secondary | ICD-10-CM | POA: Diagnosis not present

## 2019-08-31 DIAGNOSIS — Z03818 Encounter for observation for suspected exposure to other biological agents ruled out: Secondary | ICD-10-CM | POA: Diagnosis not present

## 2019-08-31 DIAGNOSIS — R0602 Shortness of breath: Secondary | ICD-10-CM | POA: Diagnosis not present

## 2019-08-31 DIAGNOSIS — I509 Heart failure, unspecified: Secondary | ICD-10-CM | POA: Diagnosis not present

## 2019-09-06 DIAGNOSIS — Z79899 Other long term (current) drug therapy: Secondary | ICD-10-CM | POA: Diagnosis not present

## 2019-09-06 DIAGNOSIS — D649 Anemia, unspecified: Secondary | ICD-10-CM | POA: Diagnosis not present

## 2019-09-13 DIAGNOSIS — D649 Anemia, unspecified: Secondary | ICD-10-CM | POA: Diagnosis not present

## 2019-09-13 DIAGNOSIS — Z79899 Other long term (current) drug therapy: Secondary | ICD-10-CM | POA: Diagnosis not present

## 2019-10-23 DIAGNOSIS — Z23 Encounter for immunization: Secondary | ICD-10-CM | POA: Diagnosis not present

## 2019-11-02 DIAGNOSIS — E1151 Type 2 diabetes mellitus with diabetic peripheral angiopathy without gangrene: Secondary | ICD-10-CM | POA: Diagnosis not present

## 2019-11-02 DIAGNOSIS — L602 Onychogryphosis: Secondary | ICD-10-CM | POA: Diagnosis not present

## 2019-11-09 DIAGNOSIS — R0602 Shortness of breath: Secondary | ICD-10-CM | POA: Diagnosis not present

## 2019-11-15 DIAGNOSIS — Z79899 Other long term (current) drug therapy: Secondary | ICD-10-CM | POA: Diagnosis not present

## 2019-11-15 DIAGNOSIS — Z03818 Encounter for observation for suspected exposure to other biological agents ruled out: Secondary | ICD-10-CM | POA: Diagnosis not present

## 2019-11-15 DIAGNOSIS — R0602 Shortness of breath: Secondary | ICD-10-CM | POA: Diagnosis not present

## 2019-11-15 DIAGNOSIS — D649 Anemia, unspecified: Secondary | ICD-10-CM | POA: Diagnosis not present

## 2019-11-15 DIAGNOSIS — F339 Major depressive disorder, recurrent, unspecified: Secondary | ICD-10-CM | POA: Diagnosis not present

## 2019-11-15 DIAGNOSIS — F410 Panic disorder [episodic paroxysmal anxiety] without agoraphobia: Secondary | ICD-10-CM | POA: Diagnosis not present

## 2019-11-15 DIAGNOSIS — G4700 Insomnia, unspecified: Secondary | ICD-10-CM | POA: Diagnosis not present

## 2019-11-15 DIAGNOSIS — F419 Anxiety disorder, unspecified: Secondary | ICD-10-CM | POA: Diagnosis not present

## 2019-11-27 DIAGNOSIS — E039 Hypothyroidism, unspecified: Secondary | ICD-10-CM | POA: Diagnosis not present

## 2019-11-27 DIAGNOSIS — Z03818 Encounter for observation for suspected exposure to other biological agents ruled out: Secondary | ICD-10-CM | POA: Diagnosis not present

## 2019-11-27 DIAGNOSIS — D649 Anemia, unspecified: Secondary | ICD-10-CM | POA: Diagnosis not present

## 2019-11-27 DIAGNOSIS — I509 Heart failure, unspecified: Secondary | ICD-10-CM | POA: Diagnosis not present

## 2019-11-27 DIAGNOSIS — Z79899 Other long term (current) drug therapy: Secondary | ICD-10-CM | POA: Diagnosis not present

## 2019-11-27 DIAGNOSIS — R69 Illness, unspecified: Secondary | ICD-10-CM | POA: Diagnosis not present

## 2019-11-27 DIAGNOSIS — E119 Type 2 diabetes mellitus without complications: Secondary | ICD-10-CM | POA: Diagnosis not present

## 2019-11-27 DIAGNOSIS — E785 Hyperlipidemia, unspecified: Secondary | ICD-10-CM | POA: Diagnosis not present

## 2019-11-29 DIAGNOSIS — Z9181 History of falling: Secondary | ICD-10-CM | POA: Diagnosis not present

## 2019-11-29 DIAGNOSIS — I5030 Unspecified diastolic (congestive) heart failure: Secondary | ICD-10-CM | POA: Diagnosis not present

## 2019-11-29 DIAGNOSIS — R2681 Unsteadiness on feet: Secondary | ICD-10-CM | POA: Diagnosis not present

## 2019-11-29 DIAGNOSIS — S72351D Displaced comminuted fracture of shaft of right femur, subsequent encounter for closed fracture with routine healing: Secondary | ICD-10-CM | POA: Diagnosis not present

## 2019-11-29 DIAGNOSIS — I639 Cerebral infarction, unspecified: Secondary | ICD-10-CM | POA: Diagnosis not present

## 2019-11-29 DIAGNOSIS — R278 Other lack of coordination: Secondary | ICD-10-CM | POA: Diagnosis not present

## 2019-11-29 DIAGNOSIS — M6281 Muscle weakness (generalized): Secondary | ICD-10-CM | POA: Diagnosis not present

## 2019-11-30 DIAGNOSIS — R2681 Unsteadiness on feet: Secondary | ICD-10-CM | POA: Diagnosis not present

## 2019-11-30 DIAGNOSIS — S72351D Displaced comminuted fracture of shaft of right femur, subsequent encounter for closed fracture with routine healing: Secondary | ICD-10-CM | POA: Diagnosis not present

## 2019-11-30 DIAGNOSIS — M6281 Muscle weakness (generalized): Secondary | ICD-10-CM | POA: Diagnosis not present

## 2019-11-30 DIAGNOSIS — R278 Other lack of coordination: Secondary | ICD-10-CM | POA: Diagnosis not present

## 2019-11-30 DIAGNOSIS — Z9181 History of falling: Secondary | ICD-10-CM | POA: Diagnosis not present

## 2019-11-30 DIAGNOSIS — I639 Cerebral infarction, unspecified: Secondary | ICD-10-CM | POA: Diagnosis not present

## 2019-12-01 DIAGNOSIS — S72351D Displaced comminuted fracture of shaft of right femur, subsequent encounter for closed fracture with routine healing: Secondary | ICD-10-CM | POA: Diagnosis not present

## 2019-12-01 DIAGNOSIS — Z9181 History of falling: Secondary | ICD-10-CM | POA: Diagnosis not present

## 2019-12-01 DIAGNOSIS — M6281 Muscle weakness (generalized): Secondary | ICD-10-CM | POA: Diagnosis not present

## 2019-12-01 DIAGNOSIS — R278 Other lack of coordination: Secondary | ICD-10-CM | POA: Diagnosis not present

## 2019-12-01 DIAGNOSIS — R2681 Unsteadiness on feet: Secondary | ICD-10-CM | POA: Diagnosis not present

## 2019-12-01 DIAGNOSIS — I639 Cerebral infarction, unspecified: Secondary | ICD-10-CM | POA: Diagnosis not present

## 2019-12-05 DIAGNOSIS — R278 Other lack of coordination: Secondary | ICD-10-CM | POA: Diagnosis not present

## 2019-12-05 DIAGNOSIS — R2681 Unsteadiness on feet: Secondary | ICD-10-CM | POA: Diagnosis not present

## 2019-12-05 DIAGNOSIS — Z9181 History of falling: Secondary | ICD-10-CM | POA: Diagnosis not present

## 2019-12-05 DIAGNOSIS — M6281 Muscle weakness (generalized): Secondary | ICD-10-CM | POA: Diagnosis not present

## 2019-12-05 DIAGNOSIS — I639 Cerebral infarction, unspecified: Secondary | ICD-10-CM | POA: Diagnosis not present

## 2019-12-05 DIAGNOSIS — S72351D Displaced comminuted fracture of shaft of right femur, subsequent encounter for closed fracture with routine healing: Secondary | ICD-10-CM | POA: Diagnosis not present

## 2019-12-08 DIAGNOSIS — I639 Cerebral infarction, unspecified: Secondary | ICD-10-CM | POA: Diagnosis not present

## 2019-12-08 DIAGNOSIS — M6281 Muscle weakness (generalized): Secondary | ICD-10-CM | POA: Diagnosis not present

## 2019-12-08 DIAGNOSIS — Z9181 History of falling: Secondary | ICD-10-CM | POA: Diagnosis not present

## 2019-12-08 DIAGNOSIS — R278 Other lack of coordination: Secondary | ICD-10-CM | POA: Diagnosis not present

## 2019-12-08 DIAGNOSIS — R2681 Unsteadiness on feet: Secondary | ICD-10-CM | POA: Diagnosis not present

## 2019-12-08 DIAGNOSIS — S72351D Displaced comminuted fracture of shaft of right femur, subsequent encounter for closed fracture with routine healing: Secondary | ICD-10-CM | POA: Diagnosis not present

## 2019-12-12 DIAGNOSIS — R278 Other lack of coordination: Secondary | ICD-10-CM | POA: Diagnosis not present

## 2019-12-12 DIAGNOSIS — R2681 Unsteadiness on feet: Secondary | ICD-10-CM | POA: Diagnosis not present

## 2019-12-12 DIAGNOSIS — I639 Cerebral infarction, unspecified: Secondary | ICD-10-CM | POA: Diagnosis not present

## 2019-12-12 DIAGNOSIS — Z9181 History of falling: Secondary | ICD-10-CM | POA: Diagnosis not present

## 2019-12-12 DIAGNOSIS — M6281 Muscle weakness (generalized): Secondary | ICD-10-CM | POA: Diagnosis not present

## 2019-12-12 DIAGNOSIS — S72351D Displaced comminuted fracture of shaft of right femur, subsequent encounter for closed fracture with routine healing: Secondary | ICD-10-CM | POA: Diagnosis not present

## 2019-12-13 DIAGNOSIS — R2681 Unsteadiness on feet: Secondary | ICD-10-CM | POA: Diagnosis not present

## 2019-12-13 DIAGNOSIS — Z9181 History of falling: Secondary | ICD-10-CM | POA: Diagnosis not present

## 2019-12-13 DIAGNOSIS — S72351D Displaced comminuted fracture of shaft of right femur, subsequent encounter for closed fracture with routine healing: Secondary | ICD-10-CM | POA: Diagnosis not present

## 2019-12-13 DIAGNOSIS — I639 Cerebral infarction, unspecified: Secondary | ICD-10-CM | POA: Diagnosis not present

## 2019-12-13 DIAGNOSIS — M6281 Muscle weakness (generalized): Secondary | ICD-10-CM | POA: Diagnosis not present

## 2019-12-13 DIAGNOSIS — R278 Other lack of coordination: Secondary | ICD-10-CM | POA: Diagnosis not present

## 2019-12-15 DIAGNOSIS — Z9181 History of falling: Secondary | ICD-10-CM | POA: Diagnosis not present

## 2019-12-15 DIAGNOSIS — R278 Other lack of coordination: Secondary | ICD-10-CM | POA: Diagnosis not present

## 2019-12-15 DIAGNOSIS — S72351D Displaced comminuted fracture of shaft of right femur, subsequent encounter for closed fracture with routine healing: Secondary | ICD-10-CM | POA: Diagnosis not present

## 2019-12-15 DIAGNOSIS — I639 Cerebral infarction, unspecified: Secondary | ICD-10-CM | POA: Diagnosis not present

## 2019-12-15 DIAGNOSIS — M6281 Muscle weakness (generalized): Secondary | ICD-10-CM | POA: Diagnosis not present

## 2019-12-15 DIAGNOSIS — R2681 Unsteadiness on feet: Secondary | ICD-10-CM | POA: Diagnosis not present

## 2019-12-18 DIAGNOSIS — R2681 Unsteadiness on feet: Secondary | ICD-10-CM | POA: Diagnosis not present

## 2019-12-18 DIAGNOSIS — M6281 Muscle weakness (generalized): Secondary | ICD-10-CM | POA: Diagnosis not present

## 2019-12-18 DIAGNOSIS — I639 Cerebral infarction, unspecified: Secondary | ICD-10-CM | POA: Diagnosis not present

## 2019-12-18 DIAGNOSIS — S72351D Displaced comminuted fracture of shaft of right femur, subsequent encounter for closed fracture with routine healing: Secondary | ICD-10-CM | POA: Diagnosis not present

## 2019-12-18 DIAGNOSIS — R278 Other lack of coordination: Secondary | ICD-10-CM | POA: Diagnosis not present

## 2019-12-18 DIAGNOSIS — Z9181 History of falling: Secondary | ICD-10-CM | POA: Diagnosis not present

## 2019-12-20 DIAGNOSIS — Z9181 History of falling: Secondary | ICD-10-CM | POA: Diagnosis not present

## 2019-12-20 DIAGNOSIS — R2681 Unsteadiness on feet: Secondary | ICD-10-CM | POA: Diagnosis not present

## 2019-12-20 DIAGNOSIS — R278 Other lack of coordination: Secondary | ICD-10-CM | POA: Diagnosis not present

## 2019-12-20 DIAGNOSIS — S72351D Displaced comminuted fracture of shaft of right femur, subsequent encounter for closed fracture with routine healing: Secondary | ICD-10-CM | POA: Diagnosis not present

## 2019-12-20 DIAGNOSIS — I639 Cerebral infarction, unspecified: Secondary | ICD-10-CM | POA: Diagnosis not present

## 2019-12-20 DIAGNOSIS — M6281 Muscle weakness (generalized): Secondary | ICD-10-CM | POA: Diagnosis not present

## 2019-12-21 DIAGNOSIS — M6281 Muscle weakness (generalized): Secondary | ICD-10-CM | POA: Diagnosis not present

## 2019-12-21 DIAGNOSIS — R278 Other lack of coordination: Secondary | ICD-10-CM | POA: Diagnosis not present

## 2019-12-21 DIAGNOSIS — R2681 Unsteadiness on feet: Secondary | ICD-10-CM | POA: Diagnosis not present

## 2019-12-21 DIAGNOSIS — I639 Cerebral infarction, unspecified: Secondary | ICD-10-CM | POA: Diagnosis not present

## 2019-12-21 DIAGNOSIS — Z9181 History of falling: Secondary | ICD-10-CM | POA: Diagnosis not present

## 2019-12-21 DIAGNOSIS — S72351D Displaced comminuted fracture of shaft of right femur, subsequent encounter for closed fracture with routine healing: Secondary | ICD-10-CM | POA: Diagnosis not present

## 2019-12-25 DIAGNOSIS — R278 Other lack of coordination: Secondary | ICD-10-CM | POA: Diagnosis not present

## 2019-12-25 DIAGNOSIS — Z9181 History of falling: Secondary | ICD-10-CM | POA: Diagnosis not present

## 2019-12-25 DIAGNOSIS — M6281 Muscle weakness (generalized): Secondary | ICD-10-CM | POA: Diagnosis not present

## 2019-12-25 DIAGNOSIS — I639 Cerebral infarction, unspecified: Secondary | ICD-10-CM | POA: Diagnosis not present

## 2019-12-25 DIAGNOSIS — R2681 Unsteadiness on feet: Secondary | ICD-10-CM | POA: Diagnosis not present

## 2019-12-25 DIAGNOSIS — S72351D Displaced comminuted fracture of shaft of right femur, subsequent encounter for closed fracture with routine healing: Secondary | ICD-10-CM | POA: Diagnosis not present

## 2019-12-26 DIAGNOSIS — M6281 Muscle weakness (generalized): Secondary | ICD-10-CM | POA: Diagnosis not present

## 2019-12-26 DIAGNOSIS — S72351D Displaced comminuted fracture of shaft of right femur, subsequent encounter for closed fracture with routine healing: Secondary | ICD-10-CM | POA: Diagnosis not present

## 2019-12-26 DIAGNOSIS — R278 Other lack of coordination: Secondary | ICD-10-CM | POA: Diagnosis not present

## 2019-12-26 DIAGNOSIS — I639 Cerebral infarction, unspecified: Secondary | ICD-10-CM | POA: Diagnosis not present

## 2019-12-26 DIAGNOSIS — R2681 Unsteadiness on feet: Secondary | ICD-10-CM | POA: Diagnosis not present

## 2019-12-26 DIAGNOSIS — Z9181 History of falling: Secondary | ICD-10-CM | POA: Diagnosis not present

## 2019-12-28 DIAGNOSIS — M6281 Muscle weakness (generalized): Secondary | ICD-10-CM | POA: Diagnosis not present

## 2019-12-28 DIAGNOSIS — I639 Cerebral infarction, unspecified: Secondary | ICD-10-CM | POA: Diagnosis not present

## 2019-12-28 DIAGNOSIS — R278 Other lack of coordination: Secondary | ICD-10-CM | POA: Diagnosis not present

## 2019-12-28 DIAGNOSIS — S72351D Displaced comminuted fracture of shaft of right femur, subsequent encounter for closed fracture with routine healing: Secondary | ICD-10-CM | POA: Diagnosis not present

## 2019-12-28 DIAGNOSIS — R2681 Unsteadiness on feet: Secondary | ICD-10-CM | POA: Diagnosis not present

## 2019-12-28 DIAGNOSIS — Z9181 History of falling: Secondary | ICD-10-CM | POA: Diagnosis not present

## 2019-12-28 DIAGNOSIS — I5031 Acute diastolic (congestive) heart failure: Secondary | ICD-10-CM | POA: Diagnosis not present

## 2020-01-16 DIAGNOSIS — I739 Peripheral vascular disease, unspecified: Secondary | ICD-10-CM | POA: Diagnosis not present

## 2020-01-16 DIAGNOSIS — L602 Onychogryphosis: Secondary | ICD-10-CM | POA: Diagnosis not present

## 2020-01-17 DIAGNOSIS — F410 Panic disorder [episodic paroxysmal anxiety] without agoraphobia: Secondary | ICD-10-CM | POA: Diagnosis not present

## 2020-01-17 DIAGNOSIS — G4700 Insomnia, unspecified: Secondary | ICD-10-CM | POA: Diagnosis not present

## 2020-01-17 DIAGNOSIS — F419 Anxiety disorder, unspecified: Secondary | ICD-10-CM | POA: Diagnosis not present

## 2020-01-17 DIAGNOSIS — F339 Major depressive disorder, recurrent, unspecified: Secondary | ICD-10-CM | POA: Diagnosis not present

## 2020-01-31 DIAGNOSIS — G4700 Insomnia, unspecified: Secondary | ICD-10-CM | POA: Diagnosis not present

## 2020-01-31 DIAGNOSIS — F339 Major depressive disorder, recurrent, unspecified: Secondary | ICD-10-CM | POA: Diagnosis not present

## 2020-01-31 DIAGNOSIS — F419 Anxiety disorder, unspecified: Secondary | ICD-10-CM | POA: Diagnosis not present

## 2020-01-31 DIAGNOSIS — F410 Panic disorder [episodic paroxysmal anxiety] without agoraphobia: Secondary | ICD-10-CM | POA: Diagnosis not present

## 2020-02-02 DIAGNOSIS — G4700 Insomnia, unspecified: Secondary | ICD-10-CM | POA: Diagnosis not present

## 2020-02-02 DIAGNOSIS — F339 Major depressive disorder, recurrent, unspecified: Secondary | ICD-10-CM | POA: Diagnosis not present

## 2020-02-02 DIAGNOSIS — F419 Anxiety disorder, unspecified: Secondary | ICD-10-CM | POA: Diagnosis not present

## 2020-02-02 DIAGNOSIS — F410 Panic disorder [episodic paroxysmal anxiety] without agoraphobia: Secondary | ICD-10-CM | POA: Diagnosis not present

## 2020-02-09 DIAGNOSIS — F410 Panic disorder [episodic paroxysmal anxiety] without agoraphobia: Secondary | ICD-10-CM | POA: Diagnosis not present

## 2020-02-09 DIAGNOSIS — F419 Anxiety disorder, unspecified: Secondary | ICD-10-CM | POA: Diagnosis not present

## 2020-02-09 DIAGNOSIS — F339 Major depressive disorder, recurrent, unspecified: Secondary | ICD-10-CM | POA: Diagnosis not present

## 2020-02-09 DIAGNOSIS — G4700 Insomnia, unspecified: Secondary | ICD-10-CM | POA: Diagnosis not present

## 2020-02-16 DIAGNOSIS — G4700 Insomnia, unspecified: Secondary | ICD-10-CM | POA: Diagnosis not present

## 2020-02-16 DIAGNOSIS — F339 Major depressive disorder, recurrent, unspecified: Secondary | ICD-10-CM | POA: Diagnosis not present

## 2020-02-16 DIAGNOSIS — F419 Anxiety disorder, unspecified: Secondary | ICD-10-CM | POA: Diagnosis not present

## 2020-02-16 DIAGNOSIS — F410 Panic disorder [episodic paroxysmal anxiety] without agoraphobia: Secondary | ICD-10-CM | POA: Diagnosis not present

## 2020-02-23 DIAGNOSIS — F410 Panic disorder [episodic paroxysmal anxiety] without agoraphobia: Secondary | ICD-10-CM | POA: Diagnosis not present

## 2020-02-23 DIAGNOSIS — G4700 Insomnia, unspecified: Secondary | ICD-10-CM | POA: Diagnosis not present

## 2020-02-23 DIAGNOSIS — F339 Major depressive disorder, recurrent, unspecified: Secondary | ICD-10-CM | POA: Diagnosis not present

## 2020-02-23 DIAGNOSIS — F419 Anxiety disorder, unspecified: Secondary | ICD-10-CM | POA: Diagnosis not present

## 2020-02-28 DIAGNOSIS — N390 Urinary tract infection, site not specified: Secondary | ICD-10-CM | POA: Diagnosis not present

## 2020-02-28 DIAGNOSIS — R69 Illness, unspecified: Secondary | ICD-10-CM | POA: Diagnosis not present

## 2020-02-28 DIAGNOSIS — R319 Hematuria, unspecified: Secondary | ICD-10-CM | POA: Diagnosis not present

## 2020-03-01 DIAGNOSIS — G4700 Insomnia, unspecified: Secondary | ICD-10-CM | POA: Diagnosis not present

## 2020-03-01 DIAGNOSIS — F339 Major depressive disorder, recurrent, unspecified: Secondary | ICD-10-CM | POA: Diagnosis not present

## 2020-03-01 DIAGNOSIS — F419 Anxiety disorder, unspecified: Secondary | ICD-10-CM | POA: Diagnosis not present

## 2020-03-01 DIAGNOSIS — F410 Panic disorder [episodic paroxysmal anxiety] without agoraphobia: Secondary | ICD-10-CM | POA: Diagnosis not present

## 2020-03-18 ENCOUNTER — Ambulatory Visit: Payer: Self-pay | Admitting: Radiation Oncology

## 2020-03-28 ENCOUNTER — Telehealth: Payer: Self-pay | Admitting: Radiation Oncology

## 2020-03-28 ENCOUNTER — Ambulatory Visit
Admission: RE | Admit: 2020-03-28 | Discharge: 2020-03-28 | Disposition: A | Discharge status: Home / Self Care | Payer: Medicare Other | Source: Ambulatory Visit | Attending: Radiation Oncology | Admitting: Radiation Oncology

## 2020-03-28 NOTE — Telephone Encounter (Signed)
Patient did not show for follow up appointment. Phone number listed has been disconnected. Phoned niece, Kathrynn Humble, to inquire. [name] reports her aunt has been placed at [facility]. She explains the physician Dr. Sharlett Iles has been seeing her aunt. Explained this RN would inform Dr. Sondra Come of this findings and verbalized appreciation for her time.

## 2020-04-07 DIAGNOSIS — R2243 Localized swelling, mass and lump, lower limb, bilateral: Secondary | ICD-10-CM | POA: Diagnosis not present

## 2020-04-07 DIAGNOSIS — I10 Essential (primary) hypertension: Secondary | ICD-10-CM | POA: Diagnosis not present

## 2020-04-07 DIAGNOSIS — G4733 Obstructive sleep apnea (adult) (pediatric): Secondary | ICD-10-CM | POA: Diagnosis not present

## 2020-04-22 DIAGNOSIS — M6281 Muscle weakness (generalized): Secondary | ICD-10-CM | POA: Diagnosis not present

## 2020-04-22 DIAGNOSIS — G459 Transient cerebral ischemic attack, unspecified: Secondary | ICD-10-CM | POA: Diagnosis not present

## 2020-04-22 DIAGNOSIS — R278 Other lack of coordination: Secondary | ICD-10-CM | POA: Diagnosis not present

## 2020-04-22 DIAGNOSIS — I5020 Unspecified systolic (congestive) heart failure: Secondary | ICD-10-CM | POA: Diagnosis not present

## 2020-04-22 DIAGNOSIS — R2681 Unsteadiness on feet: Secondary | ICD-10-CM | POA: Diagnosis not present

## 2020-04-22 DIAGNOSIS — I639 Cerebral infarction, unspecified: Secondary | ICD-10-CM | POA: Diagnosis not present

## 2020-04-22 DIAGNOSIS — S72351D Displaced comminuted fracture of shaft of right femur, subsequent encounter for closed fracture with routine healing: Secondary | ICD-10-CM | POA: Diagnosis not present

## 2020-04-22 DIAGNOSIS — Z9181 History of falling: Secondary | ICD-10-CM | POA: Diagnosis not present

## 2020-04-23 DIAGNOSIS — Z9181 History of falling: Secondary | ICD-10-CM | POA: Diagnosis not present

## 2020-04-23 DIAGNOSIS — R2681 Unsteadiness on feet: Secondary | ICD-10-CM | POA: Diagnosis not present

## 2020-04-23 DIAGNOSIS — G459 Transient cerebral ischemic attack, unspecified: Secondary | ICD-10-CM | POA: Diagnosis not present

## 2020-04-23 DIAGNOSIS — R278 Other lack of coordination: Secondary | ICD-10-CM | POA: Diagnosis not present

## 2020-04-23 DIAGNOSIS — S72351D Displaced comminuted fracture of shaft of right femur, subsequent encounter for closed fracture with routine healing: Secondary | ICD-10-CM | POA: Diagnosis not present

## 2020-04-23 DIAGNOSIS — M6281 Muscle weakness (generalized): Secondary | ICD-10-CM | POA: Diagnosis not present

## 2020-04-25 DIAGNOSIS — M6281 Muscle weakness (generalized): Secondary | ICD-10-CM | POA: Diagnosis not present

## 2020-04-25 DIAGNOSIS — S72351D Displaced comminuted fracture of shaft of right femur, subsequent encounter for closed fracture with routine healing: Secondary | ICD-10-CM | POA: Diagnosis not present

## 2020-04-25 DIAGNOSIS — G459 Transient cerebral ischemic attack, unspecified: Secondary | ICD-10-CM | POA: Diagnosis not present

## 2020-04-25 DIAGNOSIS — Z9181 History of falling: Secondary | ICD-10-CM | POA: Diagnosis not present

## 2020-04-25 DIAGNOSIS — R2681 Unsteadiness on feet: Secondary | ICD-10-CM | POA: Diagnosis not present

## 2020-04-25 DIAGNOSIS — R278 Other lack of coordination: Secondary | ICD-10-CM | POA: Diagnosis not present

## 2020-04-29 DIAGNOSIS — I5020 Unspecified systolic (congestive) heart failure: Secondary | ICD-10-CM | POA: Diagnosis not present

## 2020-04-29 DIAGNOSIS — M6281 Muscle weakness (generalized): Secondary | ICD-10-CM | POA: Diagnosis not present

## 2020-04-29 DIAGNOSIS — I639 Cerebral infarction, unspecified: Secondary | ICD-10-CM | POA: Diagnosis not present

## 2020-04-29 DIAGNOSIS — Z9181 History of falling: Secondary | ICD-10-CM | POA: Diagnosis not present

## 2020-04-29 DIAGNOSIS — S72351D Displaced comminuted fracture of shaft of right femur, subsequent encounter for closed fracture with routine healing: Secondary | ICD-10-CM | POA: Diagnosis not present

## 2020-04-29 DIAGNOSIS — R278 Other lack of coordination: Secondary | ICD-10-CM | POA: Diagnosis not present

## 2020-04-29 DIAGNOSIS — G459 Transient cerebral ischemic attack, unspecified: Secondary | ICD-10-CM | POA: Diagnosis not present

## 2020-05-01 DIAGNOSIS — M25551 Pain in right hip: Secondary | ICD-10-CM | POA: Diagnosis not present

## 2020-05-01 DIAGNOSIS — R102 Pelvic and perineal pain: Secondary | ICD-10-CM | POA: Diagnosis not present

## 2020-05-01 DIAGNOSIS — M25561 Pain in right knee: Secondary | ICD-10-CM | POA: Diagnosis not present

## 2020-05-01 DIAGNOSIS — M25552 Pain in left hip: Secondary | ICD-10-CM | POA: Diagnosis not present

## 2020-05-01 DIAGNOSIS — M79661 Pain in right lower leg: Secondary | ICD-10-CM | POA: Diagnosis not present

## 2020-05-01 DIAGNOSIS — M79651 Pain in right thigh: Secondary | ICD-10-CM | POA: Diagnosis not present

## 2020-05-06 DIAGNOSIS — S72141D Displaced intertrochanteric fracture of right femur, subsequent encounter for closed fracture with routine healing: Secondary | ICD-10-CM | POA: Diagnosis not present

## 2020-05-08 ENCOUNTER — Emergency Department (HOSPITAL_COMMUNITY): Payer: Medicare Other

## 2020-05-08 ENCOUNTER — Other Ambulatory Visit: Payer: Self-pay

## 2020-05-08 ENCOUNTER — Encounter (HOSPITAL_COMMUNITY): Payer: Self-pay | Admitting: Emergency Medicine

## 2020-05-08 ENCOUNTER — Inpatient Hospital Stay (HOSPITAL_COMMUNITY)
Admission: EM | Admit: 2020-05-08 | Discharge: 2020-05-11 | DRG: 481 | Disposition: A | Discharge status: Skilled Nursing Facility | Payer: Medicare Other | Source: Skilled Nursing Facility | Attending: Internal Medicine | Admitting: Internal Medicine

## 2020-05-08 DIAGNOSIS — Z823 Family history of stroke: Secondary | ICD-10-CM | POA: Diagnosis not present

## 2020-05-08 DIAGNOSIS — E119 Type 2 diabetes mellitus without complications: Secondary | ICD-10-CM | POA: Diagnosis present

## 2020-05-08 DIAGNOSIS — Z7983 Long term (current) use of bisphosphonates: Secondary | ICD-10-CM | POA: Diagnosis not present

## 2020-05-08 DIAGNOSIS — S72491A Other fracture of lower end of right femur, initial encounter for closed fracture: Secondary | ICD-10-CM | POA: Diagnosis not present

## 2020-05-08 DIAGNOSIS — Z6841 Body Mass Index (BMI) 40.0 and over, adult: Secondary | ICD-10-CM

## 2020-05-08 DIAGNOSIS — Z9221 Personal history of antineoplastic chemotherapy: Secondary | ICD-10-CM

## 2020-05-08 DIAGNOSIS — S72401A Unspecified fracture of lower end of right femur, initial encounter for closed fracture: Principal | ICD-10-CM | POA: Diagnosis present

## 2020-05-08 DIAGNOSIS — F331 Major depressive disorder, recurrent, moderate: Secondary | ICD-10-CM | POA: Diagnosis not present

## 2020-05-08 DIAGNOSIS — Y92129 Unspecified place in nursing home as the place of occurrence of the external cause: Secondary | ICD-10-CM

## 2020-05-08 DIAGNOSIS — M25551 Pain in right hip: Secondary | ICD-10-CM

## 2020-05-08 DIAGNOSIS — I110 Hypertensive heart disease with heart failure: Secondary | ICD-10-CM | POA: Diagnosis present

## 2020-05-08 DIAGNOSIS — I5032 Chronic diastolic (congestive) heart failure: Secondary | ICD-10-CM | POA: Diagnosis present

## 2020-05-08 DIAGNOSIS — Z7401 Bed confinement status: Secondary | ICD-10-CM | POA: Diagnosis not present

## 2020-05-08 DIAGNOSIS — I10 Essential (primary) hypertension: Secondary | ICD-10-CM | POA: Diagnosis present

## 2020-05-08 DIAGNOSIS — S72401D Unspecified fracture of lower end of right femur, subsequent encounter for closed fracture with routine healing: Secondary | ICD-10-CM | POA: Diagnosis not present

## 2020-05-08 DIAGNOSIS — Z8672 Personal history of thrombophlebitis: Secondary | ICD-10-CM

## 2020-05-08 DIAGNOSIS — R52 Pain, unspecified: Secondary | ICD-10-CM | POA: Diagnosis not present

## 2020-05-08 DIAGNOSIS — S7290XA Unspecified fracture of unspecified femur, initial encounter for closed fracture: Secondary | ICD-10-CM

## 2020-05-08 DIAGNOSIS — Z923 Personal history of irradiation: Secondary | ICD-10-CM

## 2020-05-08 DIAGNOSIS — Z8781 Personal history of (healed) traumatic fracture: Secondary | ICD-10-CM

## 2020-05-08 DIAGNOSIS — Z79890 Hormone replacement therapy (postmenopausal): Secondary | ICD-10-CM

## 2020-05-08 DIAGNOSIS — I639 Cerebral infarction, unspecified: Secondary | ICD-10-CM | POA: Diagnosis not present

## 2020-05-08 DIAGNOSIS — G4733 Obstructive sleep apnea (adult) (pediatric): Secondary | ICD-10-CM | POA: Diagnosis present

## 2020-05-08 DIAGNOSIS — W1830XA Fall on same level, unspecified, initial encounter: Secondary | ICD-10-CM | POA: Diagnosis present

## 2020-05-08 DIAGNOSIS — F329 Major depressive disorder, single episode, unspecified: Secondary | ICD-10-CM | POA: Diagnosis present

## 2020-05-08 DIAGNOSIS — D638 Anemia in other chronic diseases classified elsewhere: Secondary | ICD-10-CM | POA: Diagnosis not present

## 2020-05-08 DIAGNOSIS — S8991XA Unspecified injury of right lower leg, initial encounter: Secondary | ICD-10-CM | POA: Diagnosis not present

## 2020-05-08 DIAGNOSIS — F411 Generalized anxiety disorder: Secondary | ICD-10-CM | POA: Diagnosis not present

## 2020-05-08 DIAGNOSIS — K219 Gastro-esophageal reflux disease without esophagitis: Secondary | ICD-10-CM | POA: Diagnosis present

## 2020-05-08 DIAGNOSIS — Z90710 Acquired absence of both cervix and uterus: Secondary | ICD-10-CM | POA: Diagnosis not present

## 2020-05-08 DIAGNOSIS — M25561 Pain in right knee: Secondary | ICD-10-CM

## 2020-05-08 DIAGNOSIS — R2681 Unsteadiness on feet: Secondary | ICD-10-CM | POA: Diagnosis not present

## 2020-05-08 DIAGNOSIS — Z79899 Other long term (current) drug therapy: Secondary | ICD-10-CM | POA: Diagnosis not present

## 2020-05-08 DIAGNOSIS — Z86718 Personal history of other venous thrombosis and embolism: Secondary | ICD-10-CM | POA: Diagnosis not present

## 2020-05-08 DIAGNOSIS — M179 Osteoarthritis of knee, unspecified: Secondary | ICD-10-CM | POA: Diagnosis present

## 2020-05-08 DIAGNOSIS — M6258 Muscle wasting and atrophy, not elsewhere classified, other site: Secondary | ICD-10-CM | POA: Diagnosis not present

## 2020-05-08 DIAGNOSIS — R404 Transient alteration of awareness: Secondary | ICD-10-CM | POA: Diagnosis not present

## 2020-05-08 DIAGNOSIS — M170 Bilateral primary osteoarthritis of knee: Secondary | ICD-10-CM | POA: Diagnosis present

## 2020-05-08 DIAGNOSIS — E038 Other specified hypothyroidism: Secondary | ICD-10-CM

## 2020-05-08 DIAGNOSIS — E039 Hypothyroidism, unspecified: Secondary | ICD-10-CM | POA: Diagnosis not present

## 2020-05-08 DIAGNOSIS — M7989 Other specified soft tissue disorders: Secondary | ICD-10-CM | POA: Diagnosis not present

## 2020-05-08 DIAGNOSIS — M109 Gout, unspecified: Secondary | ICD-10-CM | POA: Diagnosis not present

## 2020-05-08 DIAGNOSIS — D649 Anemia, unspecified: Secondary | ICD-10-CM | POA: Diagnosis not present

## 2020-05-08 DIAGNOSIS — Z8249 Family history of ischemic heart disease and other diseases of the circulatory system: Secondary | ICD-10-CM | POA: Diagnosis not present

## 2020-05-08 DIAGNOSIS — Y9301 Activity, walking, marching and hiking: Secondary | ICD-10-CM | POA: Diagnosis present

## 2020-05-08 DIAGNOSIS — Z419 Encounter for procedure for purposes other than remedying health state, unspecified: Secondary | ICD-10-CM

## 2020-05-08 DIAGNOSIS — Z9049 Acquired absence of other specified parts of digestive tract: Secondary | ICD-10-CM

## 2020-05-08 DIAGNOSIS — F410 Panic disorder [episodic paroxysmal anxiety] without agoraphobia: Secondary | ICD-10-CM | POA: Diagnosis not present

## 2020-05-08 DIAGNOSIS — G4700 Insomnia, unspecified: Secondary | ICD-10-CM | POA: Diagnosis not present

## 2020-05-08 DIAGNOSIS — M1710 Unilateral primary osteoarthritis, unspecified knee: Secondary | ICD-10-CM | POA: Diagnosis present

## 2020-05-08 DIAGNOSIS — Z20822 Contact with and (suspected) exposure to covid-19: Secondary | ICD-10-CM | POA: Diagnosis present

## 2020-05-08 DIAGNOSIS — Z8719 Personal history of other diseases of the digestive system: Secondary | ICD-10-CM

## 2020-05-08 DIAGNOSIS — M1611 Unilateral primary osteoarthritis, right hip: Secondary | ICD-10-CM | POA: Diagnosis present

## 2020-05-08 DIAGNOSIS — Z96611 Presence of right artificial shoulder joint: Secondary | ICD-10-CM | POA: Diagnosis present

## 2020-05-08 DIAGNOSIS — R0902 Hypoxemia: Secondary | ICD-10-CM | POA: Diagnosis not present

## 2020-05-08 DIAGNOSIS — W19XXXA Unspecified fall, initial encounter: Secondary | ICD-10-CM | POA: Diagnosis not present

## 2020-05-08 DIAGNOSIS — R5381 Other malaise: Secondary | ICD-10-CM | POA: Diagnosis not present

## 2020-05-08 DIAGNOSIS — E785 Hyperlipidemia, unspecified: Secondary | ICD-10-CM | POA: Diagnosis present

## 2020-05-08 DIAGNOSIS — I959 Hypotension, unspecified: Secondary | ICD-10-CM | POA: Diagnosis not present

## 2020-05-08 DIAGNOSIS — S72351A Displaced comminuted fracture of shaft of right femur, initial encounter for closed fracture: Secondary | ICD-10-CM | POA: Diagnosis not present

## 2020-05-08 DIAGNOSIS — Z96652 Presence of left artificial knee joint: Secondary | ICD-10-CM | POA: Diagnosis present

## 2020-05-08 DIAGNOSIS — M9711XA Periprosthetic fracture around internal prosthetic right knee joint, initial encounter: Secondary | ICD-10-CM | POA: Diagnosis present

## 2020-05-08 DIAGNOSIS — Z8542 Personal history of malignant neoplasm of other parts of uterus: Secondary | ICD-10-CM

## 2020-05-08 DIAGNOSIS — Z8673 Personal history of transient ischemic attack (TIA), and cerebral infarction without residual deficits: Secondary | ICD-10-CM | POA: Diagnosis not present

## 2020-05-08 DIAGNOSIS — Z4789 Encounter for other orthopedic aftercare: Secondary | ICD-10-CM | POA: Diagnosis not present

## 2020-05-08 DIAGNOSIS — M2550 Pain in unspecified joint: Secondary | ICD-10-CM | POA: Diagnosis not present

## 2020-05-08 DIAGNOSIS — Z96651 Presence of right artificial knee joint: Secondary | ICD-10-CM | POA: Diagnosis not present

## 2020-05-08 DIAGNOSIS — Z885 Allergy status to narcotic agent status: Secondary | ICD-10-CM

## 2020-05-08 LAB — SARS CORONAVIRUS 2 BY RT PCR (HOSPITAL ORDER, PERFORMED IN ~~LOC~~ HOSPITAL LAB): SARS Coronavirus 2: NEGATIVE

## 2020-05-08 LAB — CBC WITH DIFFERENTIAL/PLATELET
Abs Immature Granulocytes: 0.05 10*3/uL (ref 0.00–0.07)
Basophils Absolute: 0 10*3/uL (ref 0.0–0.1)
Basophils Relative: 0 %
Eosinophils Absolute: 0 10*3/uL (ref 0.0–0.5)
Eosinophils Relative: 0 %
HCT: 41.5 % (ref 36.0–46.0)
Hemoglobin: 12.8 g/dL (ref 12.0–15.0)
Immature Granulocytes: 1 %
Lymphocytes Relative: 12 %
Lymphs Abs: 1.1 10*3/uL (ref 0.7–4.0)
MCH: 30.5 pg (ref 26.0–34.0)
MCHC: 30.8 g/dL (ref 30.0–36.0)
MCV: 99 fL (ref 80.0–100.0)
Monocytes Absolute: 0.9 10*3/uL (ref 0.1–1.0)
Monocytes Relative: 9 %
Neutro Abs: 7.4 10*3/uL (ref 1.7–7.7)
Neutrophils Relative %: 78 %
Platelets: 281 10*3/uL (ref 150–400)
RBC: 4.19 MIL/uL (ref 3.87–5.11)
RDW: 13.6 % (ref 11.5–15.5)
WBC: 9.4 10*3/uL (ref 4.0–10.5)
nRBC: 0 % (ref 0.0–0.2)

## 2020-05-08 LAB — BASIC METABOLIC PANEL
Anion gap: 13 (ref 5–15)
BUN: 15 mg/dL (ref 8–23)
CO2: 30 mmol/L (ref 22–32)
Calcium: 9.2 mg/dL (ref 8.9–10.3)
Chloride: 96 mmol/L — ABNORMAL LOW (ref 98–111)
Creatinine, Ser: 0.81 mg/dL (ref 0.44–1.00)
GFR calc Af Amer: >60 mL/min (ref >60)
GFR calc non Af Amer: >60 mL/min (ref >60)
Glucose, Bld: 144 mg/dL — ABNORMAL HIGH (ref 70–99)
Potassium: 4.1 mmol/L (ref 3.5–5.1)
Sodium: 139 mmol/L (ref 135–145)

## 2020-05-08 LAB — PROTIME-INR
INR: 1 (ref 0.8–1.2)
Prothrombin Time: 13.2 seconds (ref 11.4–15.2)

## 2020-05-08 MED ORDER — ONDANSETRON HCL 4 MG PO TABS: 4.0000 mg | ORAL_TABLET | Freq: Four times a day (QID) | ORAL | Status: DC | PRN

## 2020-05-08 MED ORDER — HYDROMORPHONE HCL 1 MG/ML IJ SOLN
0.5000 mg | INTRAMUSCULAR | Status: DC | PRN
  Administered 2020-05-08 – 2020-05-09 (×2): 1 mg via INTRAVENOUS
  Filled 2020-05-08 (×2): qty 1

## 2020-05-08 MED ORDER — MORPHINE SULFATE (PF) 4 MG/ML IV SOLN
4.0000 mg | INTRAVENOUS | Status: DC | PRN
  Administered 2020-05-08: 4 mg via INTRAVENOUS
  Filled 2020-05-08: qty 1

## 2020-05-08 MED ORDER — SODIUM CHLORIDE 0.9 % IV SOLN: INTRAVENOUS | Status: DC

## 2020-05-08 MED ORDER — ONDANSETRON HCL 4 MG/2ML IJ SOLN: 4.0000 mg | Freq: Four times a day (QID) | INTRAMUSCULAR | Status: DC | PRN

## 2020-05-08 NOTE — ED Notes (Signed)
Admitting Dr. Jonelle Sidle at bedside

## 2020-05-08 NOTE — Progress Notes (Signed)
Ortho Trauma Note  Patient has periprosthetic distal femur fracture. She will require ORIF of her femur. I have discussed case with Dr. Percell Miller. He is unable to perform surgery until at least late tomorrow evening. As a result I will plan to proceed with surgery tomorrow AM pending OR availability. Formal consult to follow in the AM.  Shona Needles, MD [facility] [phone] (office) [email or web address]

## 2020-05-08 NOTE — ED Provider Notes (Signed)
[place] [facility] EMERGENCY DEPARTMENT Provider Note   CSN: [id number] Arrival date & time: 05/08/20  1342     History No chief complaint on file.   Melinda Kerr is a 85 y.o. female with history of obesity, R hip fracture s/p fixation, and bilateral total knee replacements who presents with R leg pain. Pt is a resident at [facility]. She states she had a fall about one week ago. She was walking with her walker and her legs gave out on her and she fell on to her bottom. Since then she's been having severe pain in the R hip, R thigh, R knee. She was made non-weight bearing and they got portable xrays of the R hip, femur, knee, tib/fib, pelvis. Xrays of the right hip and right knee showed an age indeterminate mildly displaced fracture of the distal diaphysis of the femur and the proximal femur. An appointment at [facility] was made and they advised for her to be WBAT and to go to the ER if she wasn't improving. They started her on Norco TID and normally she does not take pain medicine or complaint of pain. PT came to work with her this morning but she was having severe pain with minimal ROM and therefore they decided to have her come to the ED. Pt is not on blood thinners.   HPI     Past Medical History:  Diagnosis Date   Arthritis    "shoulders, knees, lower back" (07/14/2017)   CHF (congestive heart failure) ([place])    "while in hospital w/hysterectomy in 2017"   Chronic lower back pain    Depression    Diverticulitis of large intestine with abscess 07/13/2017   DVT (deep venous thrombosis) (HCC)    Dyspnea    "cause I'm over weight"   Esophageal reflux    Gout    Hyperlipidemia    under control   Hypertension    Hypothyroidism    Morbid obesity with BMI of 40.0-44.9, adult ([place])    Nocturia    OSA on CPAP    "not wearing it when I sleep in my lift chair" (07/14/2017)   Osteoarthritis    Phlebitis    hx of   Pneumonia ?1989    Spondylosis    with scoliosis   Type II diabetes mellitus (HCC)    borderline , diet controlled    Urinary frequency    Uterine cancer ([place])    S/P chemo, radiation, hysterectomy    Patient Active Problem List   Diagnosis Date Noted   Acute CVA (cerebrovascular accident) ([place]) 05/15/2019   TIA (transient ischemic attack) 05/14/2019   Displaced comminuted fracture of shaft of right femur, initial encounter for closed fracture ([place]) 04/08/2019   GIB (gastrointestinal bleeding) 11/13/2018   Anemia associated with acute blood loss 11/13/2018   Closed fracture of right proximal humerus 10/26/2018   S/P reverse total shoulder arthroplasty, right 10/26/2018   Acute appendicitis    Diverticulitis of large intestine with abscess 07/13/2017   Colitis 07/13/2017   Aortic atherosclerosis ([place]) 09/12/2016   Coronary artery calcification 09/12/2016   Hiatal hernia 09/12/2016   Lumbar disc disease 09/12/2016   Chemotherapy-induced thrombocytopenia 08/04/2016   Chemotherapy induced neutropenia ([place]) 05/26/2016   Port catheter in place 05/26/2016   Encounter for antineoplastic chemotherapy 05/26/2016   Urinary frequency 04/24/2016   Obstructive sleep apnea on CPAP 04/24/2016   History of DVT (deep vein thrombosis) 04/24/2016   Endometrial cancer, FIGO stage IIIC ([place])  04/24/2016   Malignant neoplasm of endometrium ([place]) 03/26/2016   Dyspnea on exertion 03/19/2016   Malignant neoplasm of uterus ([place]) 03/19/2016   Status post right knee replacement 04/16/2014   Essential hypertension 04/16/2014   Postoperative anemia due to acute blood loss 04/12/2014   Status post total left knee replacement 10/14/2013   Hypothyroidism 10/14/2013   UI (urinary incontinence) 10/14/2013   Dyslipidemia 10/14/2013   Constipation 10/14/2013   Gout 10/14/2013   Anemia 10/14/2013   Allergic rhinitis 10/14/2013   Insomnia 10/14/2013   Depression 10/14/2013   OA  (osteoarthritis) of knee 10/09/2013   OSA on CPAP 08/10/2013   Morbid obesity with BMI of 45.0-49.9, adult [facility])     Past Surgical History:  Procedure Laterality Date   APPENDECTOMY     CATARACT EXTRACTION, BILATERAL Bilateral 2018   COLONOSCOPY     DILATATION & CURETTAGE/HYSTEROSCOPY WITH MYOSURE N/A 03/04/2016   Procedure: DILATATION & CURETTAGE/HYSTEROSCOPY ;  Surgeon: Azucena Fallen, MD;  Location: [facility];  Service: Gynecology;  Laterality: N/A;  polyp removal    ESOPHAGOGASTRODUODENOSCOPY (EGD) WITH PROPOFOL N/A 11/14/2018   Procedure: ESOPHAGOGASTRODUODENOSCOPY (EGD) WITH PROPOFOL;  Surgeon: Wonda Horner, MD;  Location: [facility];  Service: Endoscopy;  Laterality: N/A;   FEMUR IM NAIL Right 04/09/2019   Procedure: INTRAMEDULLARY (IM) NAIL INTERTROCH;  Surgeon: Renette Butters, MD;  Location: [facility];  Service: Orthopedics;  Laterality: Right;   HERNIA REPAIR     IR GENERIC HISTORICAL  05/15/2016   IR US GUIDE VASC ACCESS RIGHT 05/15/2016 Aletta Edouard, MD [facility]   IR GENERIC HISTORICAL  05/15/2016   IR FLUORO GUIDE CV LINE RIGHT 05/15/2016 Aletta Edouard, MD [facility]   IR REMOVAL TUN ACCESS W/ PORT W/O FL MOD SED  04/19/2018   LAPAROSCOPIC CHOLECYSTECTOMY  2004   LAPAROSCOPIC INCISIONAL / UMBILICAL / VENTRAL HERNIA REPAIR  2004   "w/gallbladder OR"   LYMPH NODE BIOPSY N/A 03/26/2016   Procedure: [place] LYMPH NODE BIOPSY;  Surgeon: Everitt Amber, MD;  Location: [facility];  Service: Gynecology;  Laterality: N/A;   REVERSE SHOULDER ARTHROPLASTY Right 10/26/2018   Procedure: REVERSE SHOULDER ARTHROPLASTY;  Surgeon: Nicholes Stairs, MD;  Location: [facility];  Service: Orthopedics;  Laterality: Right;   ROBOTIC ASSISTED TOTAL HYSTERECTOMY WITH BILATERAL SALPINGO OOPHERECTOMY N/A 03/26/2016   Procedure: XI ROBOTIC ASSISTED TOTAL Laproscopic HYSTERECTOMY WITH BILATERAL SALPINGO OOPHORECTOMY;  Surgeon: Everitt Amber, MD;  Location: [facility];  Service: Gynecology;   Laterality: N/A;   TOTAL KNEE ARTHROPLASTY Left 10/09/2013   Procedure: LEFT TOTAL KNEE ARTHROPLASTY;  Surgeon: [name], MD;  Location: [facility];  Service: Orthopedics;  Laterality: Left;   TOTAL KNEE ARTHROPLASTY Right 04/09/2014   Procedure: RIGHT TOTAL KNEE ARTHROPLASTY;  Surgeon: [name], MD;  Location: [facility];  Service: Orthopedics;  Laterality: Right;   TUBAL LIGATION  1982   VARICOSE VEIN SURGERY Bilateral 1968   WISDOM TOOTH EXTRACTION  1963     OB History   No obstetric history on file.     Family History  Problem Relation Age of Onset   Heart disease Mother    Heart disease Father    CVA Father     Social History   Tobacco Use   Smoking status: Never Smoker   Smokeless tobacco: Never Used  Vaping Use   Vaping Use: Never used  Substance Use Topics   Alcohol use: No   Drug use: No    Home Medications Prior to Admission medications  Medication Sig Start Date End Date Taking? Authorizing Provider  acetaminophen (TYLENOL) 325 MG tablet Take 2 tablets (650 mg total) by mouth every 6 (six) hours as needed for mild pain or moderate pain (pain score 1-3 or temp > 100.5). 04/13/19   Bonnell Public, MD  albuterol (VENTOLIN HFA) 108 (90 Base) MCG/ACT inhaler Inhale 2 puffs into the lungs every 6 (six) hours as needed for wheezing or shortness of breath.    [provider]  alendronate (FOSAMAX) 70 MG tablet Take 70 mg by mouth every Friday. Take with a full glass of water on an empty stomach.     [provider]  allopurinol (ZYLOPRIM) 300 MG tablet Take 300 mg by mouth daily.     [provider]  baclofen (LIORESAL) 10 MG tablet Take 1 tablet (10 mg total) by mouth 3 (three) times daily as needed for muscle spasms. 04/12/19   Swayze, Ava, DO  calcium carbonate (TUMS - DOSED IN MG ELEMENTAL CALCIUM) 500 MG chewable tablet Chew 1 tablet by mouth daily.    [provider]  cetirizine (ZYRTEC) 10 MG tablet Take 10  mg by mouth daily.    [provider]  citalopram (CELEXA) 10 MG tablet Take 10 mg by mouth daily. 03/20/19   [provider]  citalopram (CELEXA) 20 MG tablet Take 20 mg by mouth daily.  01/10/15   [provider]  docusate sodium (COLACE) 100 MG capsule Take 1 capsule (100 mg total) by mouth 2 (two) times daily. 05/16/19   [place] Donnamarie Poag, DO  furosemide (LASIX) 40 MG tablet Take 40 mg by mouth daily.  01/10/15   [provider]  HYDROcodone-acetaminophen (NORCO) 5-325 MG tablet Take 1-2 tablets by mouth every 6 (six) hours as needed for severe pain (Use tylenol for mild and moderate pain). 05/16/19   [place] Donnamarie Poag, DO  hydrOXYzine (ATARAX/VISTARIL) 25 MG tablet Take 25 mg by mouth at bedtime.    [provider]  iron polysaccharides (NIFEREX) 150 MG capsule Take 150 mg by mouth daily.    [provider]  levothyroxine (SYNTHROID, LEVOTHROID) 125 MCG tablet Take 125 mcg by mouth daily at 6 (six) AM.     [provider]  magnesium hydroxide (MILK OF MAGNESIA) 400 MG/5ML suspension Take 30 mLs by mouth daily as needed for mild constipation.    [provider]  Melatonin 3 MG TABS Take 3 mg by mouth at bedtime.     [provider]  metoCLOPramide (REGLAN) 5 MG tablet Take 5 mg by mouth 4 (four) times daily.    [provider]  nystatin (NYSTATIN) powder Apply 1 Bottle topically See admin instructions. Apply topically to abdominal skin folds and under bilateral breast for intertrigo - twice daily - please make sure skin has been cleaned and is dry before applying    [provider]  Omega-3 Fatty Acids (FISH OIL) 1000 MG CAPS Take 1,000 mg by mouth daily.     [provider]  OXYGEN Inhale 2 L into the lungs continuous. To maintain O2 sat greater then 90% every shift    [provider]  pantoprazole (PROTONIX) 40 MG tablet Take 1 tablet (40 mg total) by mouth 2 (two) times daily before a  meal. 11/17/18   Gherghe, Vella Redhead, MD  polyethylene glycol (MIRALAX / GLYCOLAX) packet Take 17 g by mouth daily as needed for mild constipation or moderate constipation. Patient taking differently: Take 17 g by mouth 2 (  two) times daily.  04/12/14   Perkins, Alexzandrew L, PA-C  pravastatin (PRAVACHOL) 10 MG tablet Take 10 mg by mouth every Thursday. At bedtime 02/14/18   [provider]  pyridOXINE (VITAMIN B-6) 100 MG tablet Take 100 mg by mouth daily.    [provider]  saccharomyces boulardii (FLORASTOR) 250 MG capsule Take 250 mg by mouth 2 (two) times daily.    [provider]  senna-docusate (SENOKOT-S) 8.6-50 MG tablet Take 1 tablet by mouth daily.    [provider]  tolterodine (DETROL LA) 4 MG 24 hr capsule Take 4 mg by mouth every evening.     [provider]  vitamin B-12 (CYANOCOBALAMIN) 1000 MCG tablet Take 1,000 mcg by mouth daily.     [provider]  vitamin C (ASCORBIC ACID) 500 MG tablet Take 500 mg by mouth daily.    [provider]  Vitamin D, Ergocalciferol, (DRISDOL) 1.25 MG (50000 UT) CAPS capsule Take 50,000 Units by mouth every 7 (seven) days. Monday    [provider]  zolpidem (AMBIEN) 10 MG tablet Take 10 mg by mouth at bedtime. 02/17/19   [provider]    Allergies    Oxybutynin  Review of Systems   Review of Systems  Respiratory: Negative for shortness of breath.   Cardiovascular: Negative for chest pain.  Gastrointestinal: Negative for abdominal pain.  Musculoskeletal: Positive for arthralgias and myalgias.  Neurological: Negative for weakness and numbness.  All other systems reviewed and are negative.   Physical Exam Updated Vital Signs BP 123/76 (BP Location: Right Arm)    Pulse 71    Temp 98.7 F (37.1 C) (Oral)    Resp 18    SpO2 90%   Physical Exam Vitals and nursing note reviewed.  Constitutional:      General: She is not in acute distress.    Appearance: She is  well-developed. She is obese. She is not ill-appearing.     Comments: Calm, cooperative. Mild tremor in the upper extremities  HENT:     Head: Normocephalic and atraumatic.  Eyes:     General: No scleral icterus.       Right eye: No discharge.        Left eye: No discharge.     Conjunctiva/sclera: Conjunctivae normal.     Pupils: Pupils are equal, round, and reactive to light.  Cardiovascular:     Rate and Rhythm: Normal rate and regular rhythm.  Pulmonary:     Effort: Pulmonary effort is normal. No respiratory distress.     Breath sounds: Normal breath sounds.  Abdominal:     General: There is no distension.  Musculoskeletal:     Cervical back: Normal range of motion.     Comments: Right hip: Mild tenderness over the anterior right hip. ROM deferred.  Right knee: Diffuse soft tissue swelling. Tenderness over the distal femur and knee. 2+DP pulse  Skin:    General: Skin is warm and dry.  Neurological:     Mental Status: She is alert and oriented to person, place, and time.  Psychiatric:        Behavior: Behavior normal.     ED Results / Procedures / Treatments   Labs (all labs ordered are listed, but only abnormal results are displayed) Labs Reviewed  BASIC METABOLIC PANEL  CBC WITH DIFFERENTIAL/PLATELET  PROTIME-INR    EKG None  Radiology No results found.  Procedures Procedures (including critical care time)  Medications Ordered in ED Medications  morphine 4 MG/ML injection 4 mg (has no administration in time range)    ED Course  I have reviewed the triage vital signs and the nursing notes.  Pertinent labs & imaging results that were available during my care of the patient were reviewed by me and considered in my medical decision making (see chart for details).  85 year old female presents with inability to ambulate after a fall last week. Outpatient xrays show indeterminate fractures of the proximal and distal femur. Most of her pain is over the distal  femur. She is N/V intact. Will obtain CT hip and knee since xrays were limited due to her body habitus.   At shift change, imaging is pending. Care signed out to J geiple PA-C who will f/u on results  MDM Rules/Calculators/A&P                           Final Clinical Impression(s) / ED Diagnoses Final diagnoses:  Fall, initial encounter  Right hip pain  Acute pain of right knee    Rx / DC Orders ED Discharge Orders    None       Recardo Evangelist, PA-C 05/08/20 1605    Sherwood Gambler, MD 05/08/20 1627

## 2020-05-08 NOTE — ED Provider Notes (Signed)
5:54 PM Signout from [employer] at shift change. CT shows fracture of distal femur. Will call pt's orthopedist for reccs. She c/o pain down into the R lower leg as well. Will obtain plain films of femur and tib/fib to see areas not imaged by CT.   BP 129/75   Pulse 73   Temp 98.7 F (37.1 C) (Oral)   Resp 17   SpO2 92%   6:24 PM Spoke with Dr. Doreatha Martin who has reviewed films.  Patient will likely require surgical repair of her femur fracture.  Plan for admission to hospitalist service.  Surgery will be no earlier than tomorrow.  I have updated the patient's guardian, [name] Yow.  She is aware of admission.  BP 129/75   Pulse 73   Temp 98.7 F (37.1 C) (Oral)   Resp 17   SpO2 92%       Carlisle Cater, PA-C 05/08/20 1831    Sherwood Gambler, MD 05/15/20 [id number]

## 2020-05-08 NOTE — H&P (Signed)
History and Physical   Melinda Kerr HWE:[id number] DOB: 1935/05/19 DOA: 05/08/2020  Referring MD/NP/PA: Dr. Verta Ellen  PCP: Maury Dus, MD   Outpatient Specialists: Dr. Edmonia Lynch, orthopedics  Patient coming from: [facility]  Chief Complaint: Fall with right femoral fracture  HPI: Melinda Kerr is a 85 y.o. female with medical history significant of CHF, previous DVT, previous humeral fracture, total knee replacement bilaterally, diabetes, diverticular disease, hypertension, hypothyroidism, morbid obesity, obstructive sleep apnea, hyperlipidemia, GERD, uterine cancer among other things who has had previous right total knee replacement as well as femur fracture with a rod in place after fixation.  Patient sustained another fall at the nursing facility and was brought with pain in her right lower leg.  X-rays and CTs showed right distal femur fracture involving the previous rod insertion as well as distal diaphysis.  The fall occurred about a week ago when she was walking with a walker.  Her legs gave out on her and she fell to her bottom.  She has been having severe pain in the right hip the right thigh and the right knee.  Initially was made nonweightbearing with x-rays initially showing no fractures.  Later that day it showed age-indeterminate displaced fracture.  Patient was taking Norco but the pain is unbearable now.  Range of motion has decreased and she was sent over here for further treatment.  Orthopedic already aware and intent to have surgical intervention on the patient tomorrow..  ED Course: Temperature 98.7 blood pressure 145/92 pulse 82 respirate 21 oxygen sats 90% room air.  CBC and chemistry largely within normal except for chloride 96.  COVID-19 is negative.  CT of the right hip and right knee shows no evidence of acute right hip fracture dislocation.  The distal end of the femur intramedullary nail is showing fracture at the distal fibula.  It is  comminuted and moderately displaced.  No evidence of hardware loosening or dislocation.  X-ray also showed the same findings.  Patient being admitted for orthopedic evaluation and treatment.  Review of Systems: As per HPI otherwise 10 point review of systems negative.    Past Medical History:  Diagnosis Date   Arthritis    "shoulders, knees, lower back" (07/14/2017)   CHF (congestive heart failure) ([place])    "while in hospital w/hysterectomy in 2017"   Chronic lower back pain    Depression    Diverticulitis of large intestine with abscess 07/13/2017   DVT (deep venous thrombosis) (HCC)    Dyspnea    "cause I'm over weight"   Esophageal reflux    Gout    Hyperlipidemia    under control   Hypertension    Hypothyroidism    Morbid obesity with BMI of 40.0-44.9, adult ([place])    Nocturia    OSA on CPAP    "not wearing it when I sleep in my lift chair" (07/14/2017)   Osteoarthritis    Phlebitis    hx of   Pneumonia ?1989   Spondylosis    with scoliosis   Type II diabetes mellitus ([place])    borderline , diet controlled    Urinary frequency    Uterine cancer ([place])    S/P chemo, radiation, hysterectomy    Past Surgical History:  Procedure Laterality Date   APPENDECTOMY     CATARACT EXTRACTION, BILATERAL Bilateral 2018   COLONOSCOPY     DILATATION & CURETTAGE/HYSTEROSCOPY WITH MYOSURE N/A 03/04/2016   Procedure: DILATATION & CURETTAGE/HYSTEROSCOPY ;  Surgeon: Criss Alvine  Benjie Karvonen, MD;  Location: [facility];  Service: Gynecology;  Laterality: N/A;  polyp removal    ESOPHAGOGASTRODUODENOSCOPY (EGD) WITH PROPOFOL N/A 11/14/2018   Procedure: ESOPHAGOGASTRODUODENOSCOPY (EGD) WITH PROPOFOL;  Surgeon: Wonda Horner, MD;  Location: [facility];  Service: Endoscopy;  Laterality: N/A;   FEMUR IM NAIL Right 04/09/2019   Procedure: INTRAMEDULLARY (IM) NAIL INTERTROCH;  Surgeon: Renette Butters, MD;  Location: [facility];  Service: Orthopedics;  Laterality: Right;   HERNIA REPAIR      IR GENERIC HISTORICAL  05/15/2016   IR US GUIDE VASC ACCESS RIGHT 05/15/2016 Aletta Edouard, MD [facility]   IR GENERIC HISTORICAL  05/15/2016   IR FLUORO GUIDE CV LINE RIGHT 05/15/2016 Aletta Edouard, MD [facility]   IR REMOVAL TUN ACCESS W/ PORT W/O FL MOD SED  04/19/2018   LAPAROSCOPIC CHOLECYSTECTOMY  2004   LAPAROSCOPIC INCISIONAL / UMBILICAL / VENTRAL HERNIA REPAIR  2004   "w/gallbladder OR"   LYMPH NODE BIOPSY N/A 03/26/2016   Procedure: Sentinel LYMPH NODE BIOPSY;  Surgeon: Everitt Amber, MD;  Location: [facility];  Service: Gynecology;  Laterality: N/A;   REVERSE SHOULDER ARTHROPLASTY Right 10/26/2018   Procedure: REVERSE SHOULDER ARTHROPLASTY;  Surgeon: Nicholes Stairs, MD;  Location: [facility];  Service: Orthopedics;  Laterality: Right;   ROBOTIC ASSISTED TOTAL HYSTERECTOMY WITH BILATERAL SALPINGO OOPHERECTOMY N/A 03/26/2016   Procedure: XI ROBOTIC ASSISTED TOTAL Laproscopic HYSTERECTOMY WITH BILATERAL SALPINGO OOPHORECTOMY;  Surgeon: Everitt Amber, MD;  Location: [facility];  Service: Gynecology;  Laterality: N/A;   TOTAL KNEE ARTHROPLASTY Left 10/09/2013   Procedure: LEFT TOTAL KNEE ARTHROPLASTY;  Surgeon: [name], MD;  Location: [facility];  Service: Orthopedics;  Laterality: Left;   TOTAL KNEE ARTHROPLASTY Right 04/09/2014   Procedure: RIGHT TOTAL KNEE ARTHROPLASTY;  Surgeon: [name], MD;  Location: [facility];  Service: Orthopedics;  Laterality: Right;   TUBAL LIGATION  1982   VARICOSE VEIN SURGERY Bilateral 1968   WISDOM TOOTH EXTRACTION  1963     reports that she has never smoked. She has never used smokeless tobacco. She reports that she does not drink alcohol and does not use drugs.  Allergies  Allergen Reactions   Oxybutynin Other (See Comments)    Bleeding gums, felt sick    Family History  Problem Relation Age of Onset   Heart disease Mother    Heart disease Father    CVA Father      Prior to Admission medications   Medication Sig Start  Date End Date Taking? Authorizing Provider  acetaminophen (TYLENOL) 325 MG tablet Take 2 tablets (650 mg total) by mouth every 6 (six) hours as needed for mild pain or moderate pain (pain score 1-3 or temp > 100.5). 04/13/19   Bonnell Public, MD  albuterol (VENTOLIN HFA) 108 (90 Base) MCG/ACT inhaler Inhale 2 puffs into the lungs every 6 (six) hours as needed for wheezing or shortness of breath.    [provider]  alendronate (FOSAMAX) 70 MG tablet Take 70 mg by mouth every Friday. Take with a full glass of water on an empty stomach.     [provider]  allopurinol (ZYLOPRIM) 300 MG tablet Take 300 mg by mouth daily.     [provider]  baclofen (LIORESAL) 10 MG tablet Take 1 tablet (10 mg total) by mouth 3 (three) times daily as needed for muscle spasms. 04/12/19   Swayze, Ava, DO  calcium carbonate (TUMS - DOSED IN MG ELEMENTAL CALCIUM) 500 MG chewable  tablet Chew 1 tablet by mouth daily.    [provider]  cetirizine (ZYRTEC) 10 MG tablet Take 10 mg by mouth daily.    [provider]  citalopram (CELEXA) 10 MG tablet Take 10 mg by mouth daily. 03/20/19   [provider]  citalopram (CELEXA) 20 MG tablet Take 20 mg by mouth daily.  01/10/15   [provider]  docusate sodium (COLACE) 100 MG capsule Take 1 capsule (100 mg total) by mouth 2 (two) times daily. 05/16/19   [place] Donnamarie Poag, DO  furosemide (LASIX) 40 MG tablet Take 40 mg by mouth daily.  01/10/15   [provider]  HYDROcodone-acetaminophen (NORCO) 5-325 MG tablet Take 1-2 tablets by mouth every 6 (six) hours as needed for severe pain (Use tylenol for mild and moderate pain). 05/16/19   [place] Donnamarie Poag, DO  hydrOXYzine (ATARAX/VISTARIL) 25 MG tablet Take 25 mg by mouth at bedtime.    [provider]  iron polysaccharides (NIFEREX) 150 MG capsule Take 150 mg by mouth daily.    [provider]  levothyroxine (SYNTHROID, LEVOTHROID) 125 MCG tablet Take  125 mcg by mouth daily at 6 (six) AM.     [provider]  magnesium hydroxide (MILK OF MAGNESIA) 400 MG/5ML suspension Take 30 mLs by mouth daily as needed for mild constipation.    [provider]  Melatonin 3 MG TABS Take 3 mg by mouth at bedtime.     [provider]  metoCLOPramide (REGLAN) 5 MG tablet Take 5 mg by mouth 4 (four) times daily.    [provider]  nystatin (NYSTATIN) powder Apply 1 Bottle topically See admin instructions. Apply topically to abdominal skin folds and under bilateral breast for intertrigo - twice daily - please make sure skin has been cleaned and is dry before applying    [provider]  Omega-3 Fatty Acids (FISH OIL) 1000 MG CAPS Take 1,000 mg by mouth daily.     [provider]  OXYGEN Inhale 2 L into the lungs continuous. To maintain O2 sat greater then 90% every shift    [provider]  pantoprazole (PROTONIX) 40 MG tablet Take 1 tablet (40 mg total) by mouth 2 (two) times daily before a meal. 11/17/18   Gherghe, Vella Redhead, MD  polyethylene glycol (MIRALAX / GLYCOLAX) packet Take 17 g by mouth daily as needed for mild constipation or moderate constipation. Patient taking differently: Take 17 g by mouth 2 (two) times daily.  04/12/14   Perkins, Alexzandrew L, PA-C  pravastatin (PRAVACHOL) 10 MG tablet Take 10 mg by mouth every Thursday. At bedtime 02/14/18   [provider]  pyridOXINE (VITAMIN B-6) 100 MG tablet Take 100 mg by mouth daily.    [provider]  saccharomyces boulardii (FLORASTOR) 250 MG capsule Take 250 mg by mouth 2 (two) times daily.    [provider]  senna-docusate (SENOKOT-S) 8.6-50 MG tablet Take 1 tablet by mouth daily.    [provider]  tolterodine (DETROL LA) 4 MG 24 hr capsule Take 4 mg by mouth every evening.     [provider]  vitamin B-12 (CYANOCOBALAMIN) 1000 MCG tablet Take 1,000 mcg by mouth daily.     [provider]  vitamin C (ASCORBIC ACID) 500 MG tablet Take 500 mg by mouth daily.    [provider]  Vitamin D, Ergocalciferol, (DRISDOL) 1.25 MG (50000 UT) CAPS capsule Take 50,000 Units by mouth every 7 (seven) days.  Monday    [provider]  zolpidem (AMBIEN) 10 MG tablet Take 10 mg by mouth at bedtime. 02/17/19   [provider]    Physical Exam: Vitals:   05/08/20 1344 05/08/20 1649 05/08/20 1857  BP: 123/76 129/75 (!) 145/92  Pulse: 71 73 82  Resp: 18 17 (!) 21  Temp: 98.7 F (37.1 C)    TempSrc: Oral    SpO2: 90% 92% 92%      Constitutional: Stable, no acute distress Vitals:   05/08/20 1344 05/08/20 1649 05/08/20 1857  BP: 123/76 129/75 (!) 145/92  Pulse: 71 73 82  Resp: 18 17 (!) 21  Temp: 98.7 F (37.1 C)    TempSrc: Oral    SpO2: 90% 92% 92%   Eyes: PERRL, lids and conjunctivae normal ENMT: Mucous membranes are moist. Posterior pharynx clear of any exudate or lesions.Normal dentition.  Neck: normal, supple, no masses, no thyromegaly Respiratory: clear to auscultation bilaterally, no wheezing, no crackles. Normal respiratory effort. No accessory muscle use.  Cardiovascular: Regular rate and rhythm, no murmurs / rubs / gallops. No extremity edema. 2+ pedal pulses. No carotid bruits.  Abdomen: no tenderness, no masses palpated. No hepatosplenomegaly. Bowel sounds positive.  Musculoskeletal: no clubbing / cyanosis. No joint deformity upper and lower extremities.  Swollen lower extremity around the knee with decreased range of motion, no contractures. Normal muscle tone.  Skin: no rashes, lesions, ulcers. No induration Neurologic: CN 2-12 grossly intact. Sensation intact, DTR normal. Strength 5/5 in all 4.  Psychiatric: Normal judgment and insight. Alert and oriented x 3. Normal mood.     Labs on Admission: I have personally reviewed following labs and imaging studies  CBC: Recent Labs  Lab 05/08/20 1644  WBC 9.4  NEUTROABS 7.4  HGB 12.8    HCT 41.5  MCV 99.0  PLT 322   Basic Metabolic Panel: Recent Labs  Lab 05/08/20 1644  NA 139  K 4.1  CL 96*  CO2 30  GLUCOSE 144*  BUN 15  CREATININE 0.81  CALCIUM 9.2   GFR: CrCl cannot be calculated (Unknown ideal weight.). Liver Function Tests: No results for input(s): AST, ALT, ALKPHOS, BILITOT, PROT, ALBUMIN in the last 168 hours. No results for input(s): LIPASE, AMYLASE in the last 168 hours. No results for input(s): AMMONIA in the last 168 hours. Coagulation Profile: Recent Labs  Lab 05/08/20 1644  INR 1.0   Cardiac Enzymes: No results for input(s): CKTOTAL, CKMB, CKMBINDEX, TROPONINI in the last 168 hours. BNP (last 3 results) No results for input(s): PROBNP in the last 8760 hours. HbA1C: No results for input(s): HGBA1C in the last 72 hours. CBG: No results for input(s): GLUCAP in the last 168 hours. Lipid Profile: No results for input(s): CHOL, HDL, LDLCALC, TRIG, CHOLHDL, LDLDIRECT in the last 72 hours. Thyroid Function Tests: No results for input(s): TSH, T4TOTAL, FREET4, T3FREE, THYROIDAB in the last 72 hours. Anemia Panel: No results for input(s): VITAMINB12, FOLATE, FERRITIN, TIBC, IRON, RETICCTPCT in the last 72 hours. Urine analysis:    Component Value Date/Time   COLORURINE COLORLESS (A) 05/14/2019 1331   APPEARANCEUR CLEAR 05/14/2019 1331   LABSPEC 1.018 05/14/2019 1331   LABSPEC 1.020 05/04/2016 1006   PHURINE 5.0 05/14/2019 1331   GLUCOSEU NEGATIVE 05/14/2019 1331   GLUCOSEU Negative 05/04/2016 1006   HGBUR NEGATIVE 05/14/2019 1331   BILIRUBINUR NEGATIVE 05/14/2019 1331   BILIRUBINUR Negative 05/04/2016 1006   KETONESUR NEGATIVE 05/14/2019 1331   PROTEINUR NEGATIVE 05/14/2019 1331   UROBILINOGEN  0.2 05/04/2016 1006   NITRITE NEGATIVE 05/14/2019 1331   LEUKOCYTESUR NEGATIVE 05/14/2019 1331   LEUKOCYTESUR Small 05/04/2016 1006   Sepsis Labs: @LABRCNTIP (procalcitonin:4,lacticidven:4) )No results found for this or any previous visit  (from the past 240 hour(s)).   Radiological Exams on Admission: DG Tibia/Fibula Right  Result Date: 05/08/2020 CLINICAL DATA:  Fall, right leg pain EXAM: RIGHT TIBIA AND FIBULA - 2 VIEW COMPARISON:  None. FINDINGS: Changes of right knee replacement. No acute bony abnormality. Specifically, no fracture, subluxation, or dislocation. IMPRESSION: No acute bony abnormality. Electronically Signed   By: Rolm Baptise M.D.   On: 05/08/2020 18:36   CT Knee Right Wo Contrast  Result Date: 05/08/2020 CLINICAL DATA:  Fall 1 week ago.  Knee trauma. EXAM: CT OF THE RIGHT KNEE WITHOUT CONTRAST TECHNIQUE: Multidetector CT imaging of the right knee was performed according to the standard protocol. Multiplanar CT image reconstructions were also generated. COMPARISON:  CT right hip same date. Prior radiographs 04/09/2019 and 04/08/2019. FINDINGS: Bones/Joint/Cartilage This examination extends from the distal thigh into the proximal lower leg. The examination of the right hip extends into the proximal thigh. There are portions of the mid thigh and mid femur which are not included in either CT exam. Therefore, further evaluation with right femur radiographs recommended. Patient is status post right femoral intramedullary nail fixation with a distal interlocking screw. Previous right total knee arthroplasty has also been performed. There is a comminuted and moderately displaced fracture of the distal femoral diaphysis, demonstrating up to 13 mm of medial displacement proximally. There is distal extension of the fracture into the lateral metaphyseal region adjacent to the femoral component of the total knee arthroplasty. No evidence of hardware loosening or dislocation. The proximal tibia and proximal fibula are intact. No large joint effusion identified, although evaluation of the soft tissues is limited by artifact from the surgical hardware. Ligaments Previous total knee arthroplasty. Muscles and Tendons Generalized muscular  atrophy.  No evidence of acute tendon injury. Soft tissues Mild soft tissue swelling in the distal thigh without focal hematoma. Soft tissue evaluation limited by artifact from the surgical hardware. IMPRESSION: 1. Comminuted and moderately displaced fracture of the distal femoral diaphysis status post right total knee arthroplasty and femoral intramedullary nail fixation. No evidence of hardware loosening or dislocation. 2. The proximal tibia and proximal fibula are intact. 3. There are portions of the mid right thigh and femur which are not included on this examination or the examination of the right hip. Plain film correlation recommended. Electronically Signed   By: Richardean Sale M.D.   On: 05/08/2020 17:20   CT Hip Right Wo Contrast  Result Date: 05/08/2020 CLINICAL DATA:  Fall 1 week ago.  Right hip pain. EXAM: CT OF THE RIGHT HIP WITHOUT CONTRAST TECHNIQUE: Multidetector CT imaging of the right hip was performed according to the standard protocol. Multiplanar CT image reconstructions were also generated. COMPARISON:  Radiographs 04/09/2019.  Pelvic CT 10/21/2017. FINDINGS: Bones/Joint/Cartilage Status post ORIF of a comminuted intertrochanteric right femur fracture with a dynamic femoral neck screw and long intramedullary nail. The distal end of the intramedullary nail is not included on this CT of the hip. No evidence of hardware loosening or failure. There is posttraumatic deformity of the proximal femur consistent with an old healed intertrochanteric fracture. There is associated heterotopic ossification. Residual fracture line is visible anteriorly on the coronal images, but there is solid healing posteriorly. No evidence of acute fracture, dislocation or femoral head avascular necrosis. Ligaments Suboptimally  assessed by CT. Muscles and Tendons Soft tissue evaluation limited by artifact from the femoral hardware. There is gluteus muscular atrophy. Soft tissues Soft tissue evaluation limited by  artifact from the femoral hardware. No large periarticular hematoma identified. IMPRESSION: 1. No evidence of acute right hip fracture, dislocation or femoral head avascular necrosis. 2. Old healed intertrochanteric fracture of the proximal femur status post ORIF. No evidence of hardware loosening. 3. The distal end of the femoral intramedullary nail is not included on this study. See separate examination of the right knee. Radiographic correlation should be considered. Electronically Signed   By: Richardean Sale M.D.   On: 05/08/2020 17:11   DG Femur Min 2 Views Right  Result Date: 05/08/2020 CLINICAL DATA:  Fall, right leg pain EXAM: RIGHT FEMUR 2 VIEWS COMPARISON:  04/08/2019 FINDINGS: There is an acute, oblique fracture of the distal diaphysis of the right femur with approximately 1 cm posteromedial displacement of the distal fracture fragment. Right hip ORIF has been performed utilizing a a gamma nail with a long intramedullary femoral rod and distal interlocking screw. Right total knee arthroplasty has been performed. Fracture plane appears to extend through the level of the distal interlocking screw of the intramedullary rod to the level of the anterior aspect of the a femoral component of right total knee arthroplasty. Healed right femoral neck fracture. Femoral head is still seated within the right acetabulum. Mild to moderate degenerative right hip arthritis is noted. IMPRESSION: 1. Acute, oblique fracture of the distal diaphysis of the right femur with approximately 1 cm posteromedial displacement of the distal fracture fragment. 2. Prior right hip ORIF. 3. Right total knee arthroplasty. Electronically Signed   By: Fidela Salisbury MD   On: 05/08/2020 18:42    EKG: Independently reviewed.  Normal sinus rhythm no significant findings  Assessment/Plan Principal Problem:   Closed fracture of right distal femur (HCC) Active Problems:   OA (osteoarthritis) of knee   Status post total left knee  replacement   Hypothyroidism   Essential hypertension   History of DVT (deep vein thrombosis)     #1 closed distal femoral fracture: Traumatic.  Patient has had previous surgery.  Patient will be admitted for pain control PT OT.  Orthopedics to see patient on possible surgery in the morning.  We'll keep her n.p.o. after midnight.  Continue supportive care.  #2 osteoarthritis: Again continue with regimen as per home.  #3 hypertension: Continue blood pressure control using home regimen.  #4 hypothyroidism: Continue to replace her thyroid.  #5 GERD: Continue with PPIs.  #6 history of CHF: Patient on Lasix.  Compensated at the moment.  #7 obstructive sleep apnea: Offer CPAP if used at home.  #8 morbid obesity: Dietary counseling   DVT prophylaxis: SCD but resume anticoagulation postoperatively Code Status: Full code Family Communication: No family at bedside Disposition Plan: Back to skilled facility Consults called: Dr. Annabell Sabal Admission status: Inpatient  Severity of Illness: The appropriate patient status for this patient is INPATIENT. Inpatient status is judged to be reasonable and necessary in order to provide the required intensity of service to ensure the patient's safety. The patient's presenting symptoms, physical exam findings, and initial radiographic and laboratory data in the context of their chronic comorbidities is felt to place them at high risk for further clinical deterioration. Furthermore, it is not anticipated that the patient will be medically stable for discharge from the hospital within 2 midnights of admission. The following factors support the patient status of inpatient.   "  The patient's presenting symptoms include fall with right lower extremity pain. " The worrisome physical exam findings include limitation in range of motion on the right lower leg with swelling. " The initial radiographic and laboratory data are worrisome because of evidence of distal  femoral fracture. " The chronic co-morbidities include hypertension and diet-controlled diabetes.   * I certify that at the point of admission it is my clinical judgment that the patient will require inpatient hospital care spanning beyond 2 midnights from the point of admission due to high intensity of service, high risk for further deterioration and high frequency of surveillance required.Barbette Merino MD [facility] Pager [phone]  If 7PM-7AM, please contact night-coverage [email or web address] Password [facility]  05/08/2020, 7:47 PM

## 2020-05-08 NOTE — ED Notes (Signed)
Patient transported to CT 

## 2020-05-08 NOTE — ED Notes (Signed)
Ortho paged for knee immobilizer

## 2020-05-08 NOTE — ED Triage Notes (Signed)
Pt here from [facility] with c/o right  Hip pain . Pt has had an xray already that did not show a break , pt could not work with PT this morning due to pain

## 2020-05-09 ENCOUNTER — Inpatient Hospital Stay (HOSPITAL_COMMUNITY): Payer: Medicare Other | Admitting: Anesthesiology

## 2020-05-09 ENCOUNTER — Inpatient Hospital Stay (HOSPITAL_COMMUNITY): Payer: Medicare Other

## 2020-05-09 ENCOUNTER — Encounter (HOSPITAL_COMMUNITY)
Admission: EM | Disposition: A | Discharge status: Skilled Nursing Facility | Payer: Self-pay | Source: Skilled Nursing Facility | Attending: Internal Medicine

## 2020-05-09 HISTORY — PX: HARDWARE REMOVAL: SHX979

## 2020-05-09 HISTORY — PX: ORIF FEMUR FRACTURE: SHX2119

## 2020-05-09 LAB — CBC
HCT: 39.8 % (ref 36.0–46.0)
Hemoglobin: 12.7 g/dL (ref 12.0–15.0)
MCH: 31.2 pg (ref 26.0–34.0)
MCHC: 31.9 g/dL (ref 30.0–36.0)
MCV: 97.8 fL (ref 80.0–100.0)
Platelets: 322 10*3/uL (ref 150–400)
RBC: 4.07 MIL/uL (ref 3.87–5.11)
RDW: 13.5 % (ref 11.5–15.5)
WBC: 10.6 10*3/uL — ABNORMAL HIGH (ref 4.0–10.5)
nRBC: 0 % (ref 0.0–0.2)

## 2020-05-09 LAB — COMPREHENSIVE METABOLIC PANEL
ALT: 54 U/L — ABNORMAL HIGH (ref 0–44)
AST: 56 U/L — ABNORMAL HIGH (ref 15–41)
Albumin: 2.9 g/dL — ABNORMAL LOW (ref 3.5–5.0)
Alkaline Phosphatase: 155 U/L — ABNORMAL HIGH (ref 38–126)
Anion gap: 11 (ref 5–15)
BUN: 15 mg/dL (ref 8–23)
CO2: 31 mmol/L (ref 22–32)
Calcium: 9.5 mg/dL (ref 8.9–10.3)
Chloride: 98 mmol/L (ref 98–111)
Creatinine, Ser: 0.9 mg/dL (ref 0.44–1.00)
GFR calc Af Amer: >60 mL/min (ref >60)
GFR calc non Af Amer: 59 mL/min — ABNORMAL LOW (ref >60)
Glucose, Bld: 151 mg/dL — ABNORMAL HIGH (ref 70–99)
Potassium: 3.9 mmol/L (ref 3.5–5.1)
Sodium: 140 mmol/L (ref 135–145)
Total Bilirubin: 2 mg/dL — ABNORMAL HIGH (ref 0.3–1.2)
Total Protein: 5.9 g/dL — ABNORMAL LOW (ref 6.5–8.1)

## 2020-05-09 LAB — SURGICAL PCR SCREEN
MRSA, PCR: NEGATIVE
Staphylococcus aureus: NEGATIVE

## 2020-05-09 LAB — GLUCOSE, CAPILLARY: Glucose-Capillary: 149 mg/dL — ABNORMAL HIGH (ref 70–99)

## 2020-05-09 SURGERY — OPEN REDUCTION INTERNAL FIXATION (ORIF) DISTAL FEMUR FRACTURE
Anesthesia: General | Site: Leg Upper | Laterality: Right

## 2020-05-09 MED ORDER — ONDANSETRON HCL 4 MG PO TABS: 4.0000 mg | ORAL_TABLET | Freq: Four times a day (QID) | ORAL | Status: DC | PRN

## 2020-05-09 MED ORDER — BACITRACIN ZINC 500 UNIT/GM EX OINT
TOPICAL_OINTMENT | CUTANEOUS | Status: AC
  Filled 2020-05-09: qty 28.35

## 2020-05-09 MED ORDER — ALBUTEROL SULFATE (2.5 MG/3ML) 0.083% IN NEBU: 3.0000 mL | INHALATION_SOLUTION | Freq: Four times a day (QID) | RESPIRATORY_TRACT | Status: DC | PRN

## 2020-05-09 MED ORDER — ASPIRIN EC 81 MG PO TBEC
81.0000 mg | DELAYED_RELEASE_TABLET | Freq: Every day | ORAL | Status: DC
  Administered 2020-05-10 – 2020-05-11 (×2): 81 mg via ORAL
  Filled 2020-05-09 (×2): qty 1

## 2020-05-09 MED ORDER — CEFAZOLIN SODIUM-DEXTROSE 2-4 GM/100ML-% IV SOLN
2.0000 g | Freq: Three times a day (TID) | INTRAVENOUS | Status: AC
  Administered 2020-05-09 – 2020-05-10 (×3): 2 g via INTRAVENOUS
  Filled 2020-05-09 (×3): qty 100

## 2020-05-09 MED ORDER — MORPHINE SULFATE (PF) 2 MG/ML IV SOLN
0.5000 mg | INTRAVENOUS | Status: DC | PRN
  Administered 2020-05-09: 1 mg via INTRAVENOUS
  Filled 2020-05-09: qty 1

## 2020-05-09 MED ORDER — 0.9 % SODIUM CHLORIDE (POUR BTL) OPTIME
TOPICAL | Status: DC | PRN
  Administered 2020-05-09: 1000 mL

## 2020-05-09 MED ORDER — CALCIUM CARBONATE ANTACID 500 MG PO CHEW
1.0000 | CHEWABLE_TABLET | Freq: Every day | ORAL | Status: DC
  Administered 2020-05-10 – 2020-05-11 (×2): 200 mg via ORAL
  Filled 2020-05-09 (×2): qty 1

## 2020-05-09 MED ORDER — METOCLOPRAMIDE HCL 5 MG PO TABS: 5.0000 mg | ORAL_TABLET | Freq: Three times a day (TID) | ORAL | Status: DC | PRN

## 2020-05-09 MED ORDER — ONDANSETRON HCL 4 MG/2ML IJ SOLN
INTRAMUSCULAR | Status: AC
  Filled 2020-05-09: qty 2

## 2020-05-09 MED ORDER — ACETAMINOPHEN 500 MG PO TABS
ORAL_TABLET | ORAL | Status: AC
  Administered 2020-05-09: 1000 mg via ORAL
  Filled 2020-05-09: qty 2

## 2020-05-09 MED ORDER — FENTANYL CITRATE (PF) 100 MCG/2ML IJ SOLN
INTRAMUSCULAR | Status: AC
  Filled 2020-05-09: qty 2

## 2020-05-09 MED ORDER — DIPHENHYDRAMINE HCL 12.5 MG/5ML PO ELIX: 12.5000 mg | ORAL_SOLUTION | ORAL | Status: DC | PRN

## 2020-05-09 MED ORDER — ACETAMINOPHEN 500 MG PO TABS: 1000.0000 mg | ORAL_TABLET | Freq: Once | ORAL | Status: AC

## 2020-05-09 MED ORDER — HYDROCODONE-ACETAMINOPHEN 5-325 MG PO TABS
1.0000 | ORAL_TABLET | ORAL | Status: DC | PRN
  Administered 2020-05-09: 2 via ORAL
  Administered 2020-05-09: 1 via ORAL
  Filled 2020-05-09: qty 1
  Filled 2020-05-09: qty 2

## 2020-05-09 MED ORDER — BACITRACIN 500 UNIT/GM EX OINT
TOPICAL_OINTMENT | CUTANEOUS | Status: DC | PRN
  Administered 2020-05-09: 1 via TOPICAL

## 2020-05-09 MED ORDER — ENOXAPARIN SODIUM 40 MG/0.4ML ~~LOC~~ SOLN
40.0000 mg | SUBCUTANEOUS | Status: DC
  Administered 2020-05-10 – 2020-05-11 (×2): 40 mg via SUBCUTANEOUS
  Filled 2020-05-09 (×2): qty 0.4

## 2020-05-09 MED ORDER — LIDOCAINE 2% (20 MG/ML) 5 ML SYRINGE
INTRAMUSCULAR | Status: AC
  Filled 2020-05-09: qty 10

## 2020-05-09 MED ORDER — FENTANYL CITRATE (PF) 250 MCG/5ML IJ SOLN
INTRAMUSCULAR | Status: AC
  Filled 2020-05-09: qty 5

## 2020-05-09 MED ORDER — LIDOCAINE 2% (20 MG/ML) 5 ML SYRINGE
INTRAMUSCULAR | Status: DC | PRN
  Administered 2020-05-09: 60 mg via INTRAVENOUS

## 2020-05-09 MED ORDER — CHLORHEXIDINE GLUCONATE 0.12 % MT SOLN: 15.0000 mL | Freq: Once | OROMUCOSAL | Status: AC

## 2020-05-09 MED ORDER — ZOLPIDEM TARTRATE 5 MG PO TABS
5.0000 mg | ORAL_TABLET | Freq: Every evening | ORAL | Status: DC | PRN
  Administered 2020-05-10: 5 mg via ORAL
  Filled 2020-05-09: qty 1

## 2020-05-09 MED ORDER — DEXAMETHASONE SODIUM PHOSPHATE 10 MG/ML IJ SOLN
INTRAMUSCULAR | Status: AC
  Filled 2020-05-09: qty 1

## 2020-05-09 MED ORDER — DOCUSATE SODIUM 100 MG PO CAPS
100.0000 mg | ORAL_CAPSULE | Freq: Two times a day (BID) | ORAL | Status: DC
  Administered 2020-05-09 – 2020-05-11 (×4): 100 mg via ORAL
  Filled 2020-05-09 (×4): qty 1

## 2020-05-09 MED ORDER — CALCIUM CARBONATE ANTACID 500 MG PO CHEW: 1.0000 | CHEWABLE_TABLET | Freq: Every day | ORAL | Status: DC

## 2020-05-09 MED ORDER — CHLORHEXIDINE GLUCONATE 0.12 % MT SOLN
OROMUCOSAL | Status: AC
  Administered 2020-05-09: 15 mL via OROMUCOSAL
  Filled 2020-05-09: qty 15

## 2020-05-09 MED ORDER — PHENYLEPHRINE 40 MCG/ML (10ML) SYRINGE FOR IV PUSH (FOR BLOOD PRESSURE SUPPORT)
PREFILLED_SYRINGE | INTRAVENOUS | Status: AC
  Filled 2020-05-09: qty 10

## 2020-05-09 MED ORDER — ONDANSETRON HCL 4 MG/2ML IJ SOLN
INTRAMUSCULAR | Status: AC
  Filled 2020-05-09: qty 4

## 2020-05-09 MED ORDER — PRAVASTATIN SODIUM 10 MG PO TABS
10.0000 mg | ORAL_TABLET | ORAL | Status: DC
  Administered 2020-05-09: 10 mg via ORAL
  Filled 2020-05-09 (×3): qty 1

## 2020-05-09 MED ORDER — ROCURONIUM BROMIDE 10 MG/ML (PF) SYRINGE
PREFILLED_SYRINGE | INTRAVENOUS | Status: DC | PRN
  Administered 2020-05-09: 40 mg via INTRAVENOUS

## 2020-05-09 MED ORDER — POLYETHYLENE GLYCOL 3350 17 G PO PACK: 17.0000 g | PACK | Freq: Every day | ORAL | Status: DC | PRN

## 2020-05-09 MED ORDER — ONDANSETRON HCL 4 MG/2ML IJ SOLN: 4.0000 mg | Freq: Four times a day (QID) | INTRAMUSCULAR | Status: DC | PRN

## 2020-05-09 MED ORDER — ACETAMINOPHEN 325 MG PO TABS
325.0000 mg | ORAL_TABLET | Freq: Four times a day (QID) | ORAL | Status: DC | PRN
  Administered 2020-05-10: 650 mg via ORAL
  Filled 2020-05-09: qty 2

## 2020-05-09 MED ORDER — ONDANSETRON HCL 4 MG/2ML IJ SOLN: 4.0000 mg | Freq: Once | INTRAMUSCULAR | Status: DC | PRN

## 2020-05-09 MED ORDER — POTASSIUM CHLORIDE IN NACL 20-0.9 MEQ/L-% IV SOLN: INTRAVENOUS | Status: DC

## 2020-05-09 MED ORDER — VANCOMYCIN HCL 1000 MG IV SOLR
INTRAVENOUS | Status: AC
  Filled 2020-05-09: qty 1000

## 2020-05-09 MED ORDER — PROPOFOL 10 MG/ML IV BOLUS
INTRAVENOUS | Status: AC
  Filled 2020-05-09: qty 20

## 2020-05-09 MED ORDER — LACTATED RINGERS IV SOLN: INTRAVENOUS | Status: DC

## 2020-05-09 MED ORDER — CITALOPRAM HYDROBROMIDE 20 MG PO TABS
10.0000 mg | ORAL_TABLET | Freq: Every day | ORAL | Status: DC
  Administered 2020-05-09 – 2020-05-11 (×3): 10 mg via ORAL
  Filled 2020-05-09 (×3): qty 1

## 2020-05-09 MED ORDER — ROCURONIUM BROMIDE 10 MG/ML (PF) SYRINGE
PREFILLED_SYRINGE | INTRAVENOUS | Status: AC
  Filled 2020-05-09: qty 20

## 2020-05-09 MED ORDER — METOCLOPRAMIDE HCL 5 MG/ML IJ SOLN: 5.0000 mg | Freq: Three times a day (TID) | INTRAMUSCULAR | Status: DC | PRN

## 2020-05-09 MED ORDER — HYDROCODONE-ACETAMINOPHEN 7.5-325 MG PO TABS
1.0000 | ORAL_TABLET | ORAL | Status: DC | PRN
  Administered 2020-05-10: 1 via ORAL
  Filled 2020-05-09: qty 1

## 2020-05-09 MED ORDER — LACTATED RINGERS IV SOLN: INTRAVENOUS | Status: DC | PRN

## 2020-05-09 MED ORDER — CEFAZOLIN SODIUM-DEXTROSE 2-3 GM-%(50ML) IV SOLR
INTRAVENOUS | Status: DC | PRN
  Administered 2020-05-09: 2 g via INTRAVENOUS

## 2020-05-09 MED ORDER — SUGAMMADEX SODIUM 200 MG/2ML IV SOLN
INTRAVENOUS | Status: DC | PRN
  Administered 2020-05-09: 200 mg via INTRAVENOUS

## 2020-05-09 MED ORDER — FENTANYL CITRATE (PF) 100 MCG/2ML IJ SOLN
25.0000 ug | INTRAMUSCULAR | Status: DC | PRN
  Administered 2020-05-09 (×2): 50 ug via INTRAVENOUS

## 2020-05-09 MED ORDER — VANCOMYCIN HCL 1000 MG IV SOLR
INTRAVENOUS | Status: DC | PRN
  Administered 2020-05-09: 1000 mg via TOPICAL

## 2020-05-09 MED ORDER — PROPOFOL 10 MG/ML IV BOLUS
INTRAVENOUS | Status: DC | PRN
  Administered 2020-05-09: 150 mg via INTRAVENOUS

## 2020-05-09 MED ORDER — HYDROXYZINE HCL 25 MG PO TABS
25.0000 mg | ORAL_TABLET | Freq: Every day | ORAL | Status: DC
  Administered 2020-05-09 – 2020-05-10 (×2): 25 mg via ORAL
  Filled 2020-05-09 (×2): qty 1

## 2020-05-09 MED ORDER — PANTOPRAZOLE SODIUM 40 MG PO TBEC
40.0000 mg | DELAYED_RELEASE_TABLET | Freq: Every day | ORAL | Status: DC
  Administered 2020-05-09 – 2020-05-11 (×3): 40 mg via ORAL
  Filled 2020-05-09 (×3): qty 1

## 2020-05-09 MED ORDER — LEVOTHYROXINE SODIUM 25 MCG PO TABS
125.0000 ug | ORAL_TABLET | Freq: Every day | ORAL | Status: DC
  Administered 2020-05-10 – 2020-05-11 (×2): 125 ug via ORAL
  Filled 2020-05-09 (×2): qty 1

## 2020-05-09 MED ORDER — FENTANYL CITRATE (PF) 250 MCG/5ML IJ SOLN
INTRAMUSCULAR | Status: DC | PRN
  Administered 2020-05-09 (×4): 50 ug via INTRAVENOUS

## 2020-05-09 MED ORDER — ONDANSETRON HCL 4 MG/2ML IJ SOLN
INTRAMUSCULAR | Status: DC | PRN
  Administered 2020-05-09: 4 mg via INTRAVENOUS

## 2020-05-09 MED ORDER — METHOCARBAMOL 1000 MG/10ML IJ SOLN
500.0000 mg | Freq: Four times a day (QID) | INTRAVENOUS | Status: DC | PRN
  Filled 2020-05-09: qty 5

## 2020-05-09 MED ORDER — METHOCARBAMOL 500 MG PO TABS
500.0000 mg | ORAL_TABLET | Freq: Four times a day (QID) | ORAL | Status: DC | PRN
  Administered 2020-05-09: 500 mg via ORAL
  Filled 2020-05-09: qty 1

## 2020-05-09 SURGICAL SUPPLY — 107 items
ADH SKN CLS APL DERMABOND .7 (GAUZE/BANDAGES/DRESSINGS) ×1
APL PRP STRL LF DISP 70% ISPRP (MISCELLANEOUS) ×1
BANDAGE ESMARK 6X9 LF (GAUZE/BANDAGES/DRESSINGS) ×1 IMPLANT
BIT DRILL 4.3 (BIT) IMPLANT
BIT DRILL 4.3 SHORT (BIT) ×1 IMPLANT
BIT DRILL 4.3MM SHORT (BIT) ×1
BIT DRILL LONG 3.3 (BIT) ×2 IMPLANT
BIT DRILL LONG 3.3MM (BIT) ×2
BIT DRILL QC 3.3X195 (BIT) ×2 IMPLANT
BLADE CLIPPER SURG (BLADE) IMPLANT
BNDG CMPR 9X6 STRL LF SNTH (GAUZE/BANDAGES/DRESSINGS) ×1
BNDG CMPR MED 10X6 ELC LF (GAUZE/BANDAGES/DRESSINGS) ×1
BNDG COHESIVE 6X5 TAN STRL LF (GAUZE/BANDAGES/DRESSINGS) ×3 IMPLANT
BNDG ELASTIC 4X5.8 VLCR STR LF (GAUZE/BANDAGES/DRESSINGS) ×3 IMPLANT
BNDG ELASTIC 6X10 VLCR STRL LF (GAUZE/BANDAGES/DRESSINGS) ×3 IMPLANT
BNDG ELASTIC 6X5.8 VLCR STR LF (GAUZE/BANDAGES/DRESSINGS) ×3 IMPLANT
BNDG ESMARK 6X9 LF (GAUZE/BANDAGES/DRESSINGS) ×3
BNDG GAUZE ELAST 4 BULKY (GAUZE/BANDAGES/DRESSINGS) ×6 IMPLANT
BRUSH SCRUB EZ PLAIN DRY (MISCELLANEOUS) ×6 IMPLANT
CANISTER SUCT 3000ML PPV (MISCELLANEOUS) ×3 IMPLANT
CAP LOCK NCB (Cap) ×14 IMPLANT
CHLORAPREP W/TINT 26 (MISCELLANEOUS) ×3 IMPLANT
CLOSURE WOUND 1/2 X4 (GAUZE/BANDAGES/DRESSINGS)
COVER SURGICAL LIGHT HANDLE (MISCELLANEOUS) ×6 IMPLANT
COVER WAND RF STERILE (DRAPES) ×3 IMPLANT
CUFF TOURN SGL QUICK 18X4 (TOURNIQUET CUFF) IMPLANT
CUFF TOURN SGL QUICK 24 (TOURNIQUET CUFF)
CUFF TOURN SGL QUICK 34 (TOURNIQUET CUFF)
CUFF TRNQT CYL 24X4X16.5-23 (TOURNIQUET CUFF) IMPLANT
CUFF TRNQT CYL 34X4.125X (TOURNIQUET CUFF) IMPLANT
DERMABOND ADVANCED (GAUZE/BANDAGES/DRESSINGS) ×2
DERMABOND ADVANCED .7 DNX12 (GAUZE/BANDAGES/DRESSINGS) IMPLANT
DRAPE C-ARM 42X72 X-RAY (DRAPES) ×3 IMPLANT
DRAPE C-ARMOR (DRAPES) ×3 IMPLANT
DRAPE HALF SHEET 40X57 (DRAPES) ×4 IMPLANT
DRAPE ORTHO SPLIT 77X108 STRL (DRAPES) ×6
DRAPE SURG 17X23 STRL (DRAPES) ×3 IMPLANT
DRAPE SURG ORHT 6 SPLT 77X108 (DRAPES) ×2 IMPLANT
DRAPE U-SHAPE 47X51 STRL (DRAPES) ×3 IMPLANT
DRILL BIT 4.3 (BIT) ×3
DRSG ADAPTIC 3X8 NADH LF (GAUZE/BANDAGES/DRESSINGS) ×3 IMPLANT
DRSG MEPILEX BORDER 4X12 (GAUZE/BANDAGES/DRESSINGS) IMPLANT
DRSG MEPILEX BORDER 4X4 (GAUZE/BANDAGES/DRESSINGS) IMPLANT
DRSG MEPILEX BORDER 4X8 (GAUZE/BANDAGES/DRESSINGS) ×4 IMPLANT
DRSG MEPITEL 4X7.2 (GAUZE/BANDAGES/DRESSINGS) ×2 IMPLANT
DRSG PAD ABDOMINAL 8X10 ST (GAUZE/BANDAGES/DRESSINGS) ×9 IMPLANT
ELECT REM PT RETURN 9FT ADLT (ELECTROSURGICAL) ×3
ELECTRODE REM PT RTRN 9FT ADLT (ELECTROSURGICAL) ×1 IMPLANT
GAUZE SPONGE 4X4 12PLY STRL (GAUZE/BANDAGES/DRESSINGS) ×3 IMPLANT
GAUZE SPONGE 4X4 12PLY STRL LF (GAUZE/BANDAGES/DRESSINGS) ×4 IMPLANT
GLOVE BIO SURGEON STRL SZ 6.5 (GLOVE) ×6 IMPLANT
GLOVE BIO SURGEON STRL SZ7.5 (GLOVE) ×12 IMPLANT
GLOVE BIO SURGEONS STRL SZ 6.5 (GLOVE) ×3
GLOVE BIOGEL PI IND STRL 6.5 (GLOVE) ×1 IMPLANT
GLOVE BIOGEL PI IND STRL 7.5 (GLOVE) ×1 IMPLANT
GLOVE BIOGEL PI INDICATOR 6.5 (GLOVE) ×2
GLOVE BIOGEL PI INDICATOR 7.5 (GLOVE) ×2
GOWN STRL REUS W/ TWL LRG LVL3 (GOWN DISPOSABLE) ×3 IMPLANT
GOWN STRL REUS W/TWL LRG LVL3 (GOWN DISPOSABLE) ×9
K-WIRE 2.0 (WIRE) ×6
K-WIRE FXSTD 280X2XNS SS (WIRE) ×2
KIT BASIN OR (CUSTOM PROCEDURE TRAY) ×3 IMPLANT
KIT TURNOVER KIT B (KITS) ×3 IMPLANT
KWIRE FXSTD 280X2XNS SS (WIRE) IMPLANT
MANIFOLD NEPTUNE II (INSTRUMENTS) ×3 IMPLANT
NEEDLE 22X1 1/2 (OR ONLY) (NEEDLE) IMPLANT
NS IRRIG 1000ML POUR BTL (IV SOLUTION) ×3 IMPLANT
PACK ORTHO EXTREMITY (CUSTOM PROCEDURE TRAY) ×3 IMPLANT
PACK TOTAL JOINT (CUSTOM PROCEDURE TRAY) ×3 IMPLANT
PAD ARMBOARD 7.5X6 YLW CONV (MISCELLANEOUS) ×6 IMPLANT
PAD CAST 4YDX4 CTTN HI CHSV (CAST SUPPLIES) ×1 IMPLANT
PADDING CAST COTTON 4X4 STRL (CAST SUPPLIES) ×3
PADDING CAST COTTON 6X4 STRL (CAST SUPPLIES) ×7 IMPLANT
PLATE FEM DIST NCB PP 278MM (Plate) ×2 IMPLANT
SCREW 5.0 70MM (Screw) ×6 IMPLANT
SCREW 5.0 80MM (Screw) ×4 IMPLANT
SCREW CORTICAL NCB 5.0X65 (Screw) ×2 IMPLANT
SCREW HUM NCB PA ST 4X60 (Screw) ×2 IMPLANT
SCREW NCB 4.0MX38M (Screw) ×2 IMPLANT
SCREW NCB 4.0MX42M (Screw) ×4 IMPLANT
SCREW NCB 5.0X85MM (Screw) ×2 IMPLANT
SPONGE LAP 18X18 RF (DISPOSABLE) ×3 IMPLANT
STAPLER VISISTAT 35W (STAPLE) ×3 IMPLANT
STOCKINETTE IMPERVIOUS LG (DRAPES) ×3 IMPLANT
STRIP CLOSURE SKIN 1/2X4 (GAUZE/BANDAGES/DRESSINGS) IMPLANT
SUCTION FRAZIER HANDLE 10FR (MISCELLANEOUS) ×3
SUCTION TUBE FRAZIER 10FR DISP (MISCELLANEOUS) ×1 IMPLANT
SUT ETHILON 3 0 PS 1 (SUTURE) ×6 IMPLANT
SUT MNCRL AB 3-0 PS2 18 (SUTURE) ×3 IMPLANT
SUT MNCRL AB 3-0 PS2 27 (SUTURE) ×2 IMPLANT
SUT MON AB 2-0 CT1 36 (SUTURE) ×3 IMPLANT
SUT PDS AB 2-0 CT1 27 (SUTURE) IMPLANT
SUT VIC AB 0 CT1 27 (SUTURE)
SUT VIC AB 0 CT1 27XBRD ANBCTR (SUTURE) IMPLANT
SUT VIC AB 1 CT1 27 (SUTURE)
SUT VIC AB 1 CT1 27XBRD ANBCTR (SUTURE) IMPLANT
SUT VIC AB 2-0 CT1 27 (SUTURE) ×6
SUT VIC AB 2-0 CT1 TAPERPNT 27 (SUTURE) ×2 IMPLANT
SYR CONTROL 10ML LL (SYRINGE) IMPLANT
TOWEL GREEN STERILE (TOWEL DISPOSABLE) ×6 IMPLANT
TOWEL GREEN STERILE FF (TOWEL DISPOSABLE) ×6 IMPLANT
TRAY FOLEY MTR SLVR 16FR STAT (SET/KITS/TRAYS/PACK) IMPLANT
TUBE CONNECTING 12'X1/4 (SUCTIONS) ×1
TUBE CONNECTING 12X1/4 (SUCTIONS) ×2 IMPLANT
UNDERPAD 30X36 HEAVY ABSORB (UNDERPADS AND DIAPERS) ×3 IMPLANT
WATER STERILE IRR 1000ML POUR (IV SOLUTION) ×6 IMPLANT
YANKAUER SUCT BULB TIP NO VENT (SUCTIONS) ×3 IMPLANT

## 2020-05-09 NOTE — Transfer of Care (Signed)
Immediate Anesthesia Transfer of Care Note  Patient: Melinda Kerr  Procedure(s) Performed: OPEN REDUCTION INTERNAL FIXATION (ORIF) DISTAL FEMUR FRACTURE (Right Leg Upper) HARDWARE REMOVAL RIGHT FEMUR (Right Leg Upper)  Patient Location: PACU  Anesthesia Type:General  Level of Consciousness: awake, alert , oriented, patient cooperative and responds to stimulation  Airway & Oxygen Therapy: Patient Spontanous Breathing and Patient connected to face mask oxygen  Post-op Assessment: Report given to RN and Post -op Vital signs reviewed and stable  Post vital signs: Reviewed and stable  Last Vitals:  Vitals Value Taken Time  BP 155/81 05/09/20 1308  Temp    Pulse 75 05/09/20 1309  Resp 20 05/09/20 1309  SpO2 98 % 05/09/20 1309  Vitals shown include unvalidated device data.  Last Pain:  Vitals:   05/09/20 0902  TempSrc:   PainSc: 5          Complications: No complications documented.

## 2020-05-09 NOTE — Op Note (Signed)
Orthopaedic Surgery Operative Note (CSN: [id number] ) Date of Surgery: 05/09/2020  Admit Date: 05/08/2020   Diagnoses: Pre-Op Diagnoses: Right periprosthetic distal femur fracture   Post-Op Diagnosis: Same  Procedures: 1. CPT 27511-Open reduction internal fixation of right distal femur fracture 2. CPT 20680-Removal of hardware right femur  Surgeons : Primary: Shona Needles, MD  Assistant: Patrecia Pace, PA-C  Location: OR 4   Anesthesia:General  Antibiotics: Ancef 2g preop with 1 gm vancomycin powder placed topically   Tourniquet time:None  Estimated Blood YNWG:956 mL  Complications:None   Specimens:None   Implants: Implant Name Type Inv. Item Serial No. Manufacturer Lot No. LRB No. Used Action  PLATE FEM DIST NCB PP 278MM - [id number] Plate PLATE FEM DIST NCB PP 278MM  [employer](ORTH,TRAU,BIO,SG)  Right 1 Implanted  SCREW 5.0 70MM - [id number] Screw SCREW 5.0 70MM  [employer](ORTH,TRAU,BIO,SG)  Right 1 Implanted  SCREW HUM NCB PA ST 4X60 - [id number] Screw SCREW HUM NCB PA ST 4X60  [employer](ORTH,TRAU,BIO,SG)  Right 1 Implanted  SCREW NCB 4.0MX42M - [id number] Screw SCREW NCB 4.0MX42M  [employer](ORTH,TRAU,BIO,SG)  Right 2 Implanted  SCREW NCB 4.0MX38M - [id number] Screw SCREW NCB 4.0MX38M  [employer](ORTH,TRAU,BIO,SG)  Right 1 Implanted  SCREW 5.0 70MM - [id number] Screw SCREW 5.0 70MM  [employer](ORTH,TRAU,BIO,SG)  Right 1 Implanted  SCREW NCB 5.0X85MM - [id number] Screw SCREW NCB 5.0X85MM  [employer](ORTH,TRAU,BIO,SG)  Right 1 Implanted  CAP LOCK NCB - [id number] Cap CAP LOCK NCB  [employer](ORTH,TRAU,BIO,SG)  Right 7 Implanted  SCREW 5.0 80MM - [id number] Screw SCREW 5.0 80MM  [employer](ORTH,TRAU,BIO,SG)  Right 2 Implanted     Indications for Surgery: 85 year old female who presents after a fall a week ago for which she had x-rays that showed no fracture.  Unfortunately she continued to have significant pain and presented to the emergency room yesterday.   X-rays and CT scan showed a displaced spiral periprosthetic distal femur fracture.  Due to the displacement and unstable nature of her injury I recommended proceeding to the operating room for open reduction internal fixation.  Risks and benefits were discussed with the patient and her niece.  Risks included but not limited to bleeding, infection, malunion, nonunion, hardware failure, hardware irritation, nerve or blood vessel injury, DVT, even the possibility of anesthetic complications.  The patient and her niece agreed to proceed with surgery and consent was obtained.  Operative Findings: 1.  Removal of distal interlock from previous gamma nail. 2.  Open reduction internal fixation of right periprosthetic distal femur fracture using [employer] NCB distal femoral locking plate.  Procedure: The patient was identified in the preoperative holding area. Consent was confirmed with the patient and their family and all questions were answered. The operative extremity was marked after confirmation with the patient. she was then brought back to the operating room by our anesthesia colleagues.  She was placed under general anesthetic and carefully transferred over to a radiolucent flat top table.  A bump was placed under her operative hip.  The right lower extremity was prepped and draped in usual sterile fashion.  A timeout was performed to verify the patient, the procedure, and the extremity.  Preoperative antibiotics were dosed.  Fluoroscopic imaging was obtained to show the unstable nature of her injury.  A lateral approach to the distal femur was carried down through skin and subcutaneous tissue.  The IT band was split in line with the incision.  I then exposed the lateral surface of the distal femur.  I developed a plane between the vastus lateralis and the lateral femoral shaft.  I then performed a reduction maneuver using a triangle.  I was able to align the AP and lateral appropriately.  I then  attached the 12 hole [employer] NCB plate to the targeting arm and slid this submuscularly along the lateral cortex of the femur.  I held the distal portion of the plate provisionally with a 2.0 mm K wire.  Proximal portion was held percutaneously with a 3.3 mm drill bit that was able to obtain bicortical purchase anterior to the femoral nail.  I placed 5.0 millimeter screws distally to bring the plate flush to bone.  2 screws were placed.  I then returned to the femoral shaft and placed 3 bicortical 4.0 millimeter screws.  These were all placed anterior to the femoral nail.  I returned to the distal segment and placed a 5.0 millimeter screws.  Using the targeting arm I placed a locking caps in the proximal 2 screws.  The distal femoral shaft screw had significant purchase in I was not able to advance the screw any further and was unable to place a locking cap.  The targeting arm was removed the locking caps were placed in the distal segment.  Final fluoroscopic imaging was obtained.  The incision was copiously irrigated.  A gram of vancomycin powder was placed into the incision.  A layer closure of 0 Vicryl, 2-0 Vicryl and 3-0 nylon was used.  Sterile dressing was placed.  The patient was awoken from anesthesia and taken to the PACU in stable condition  Post Op Plan/Instructions: The patient may be weightbearing as tolerated to the right lower extremity.  She will receive postoperative Ancef.  She will receive Lovenox for DVT prophylaxis.  We will have her mobilize with physical and Occupational Therapy.  I was present and performed the entire surgery.  Patrecia Pace, PA-C did assist me throughout the case. An assistant was necessary given the difficulty in approach, maintenance of reduction and ability to instrument the fracture.   Katha Hamming, MD [facility]

## 2020-05-09 NOTE — Consult Note (Signed)
Orthopaedic Trauma Service (OTS) Consult   Patient ID: Melinda Kerr MRN: [id number] DOB/AGE: 83-Jan-1936 83 y.o.  Reason for Consult:Right periprosthetic distal femur fracture Referring Physician: Dr. Sherwood Gambler, MD [facility]  HPI: Melinda Kerr is an 85 y.o. female with a history of congestive heart failure, previous DVT, previous total knee replacement and hip fracture obesity, diabetes obstructive sleep apnea who presented yesterday with continued pain.  She had a fall a week ago [facility] which did not show any fracture.  She continued to have severe pain for which she was taken to Dr. Debroah Loop office who did her hip nail in 2020.  Again x-rays did not show the fracture.  However she presented to the emergency room yesterday which showed a displaced spiral oblique distal femur fracture.  I was consulted for evaluation.  Patient was seen and evaluated on 5 N.  She is complaining of significant pain.  Worsens with movement.  Pain medication improves it.  She has no pain anywhere else.  I confirmed the story with her niece.  She currently resides at [facility].  Past Medical History:  Diagnosis Date  . Arthritis    "shoulders, knees, lower back" (07/14/2017)  . CHF (congestive heart failure) ([place])    "while in hospital w/hysterectomy in 2017"  . Chronic lower back pain   . Depression   . Diverticulitis of large intestine with abscess 07/13/2017  . DVT (deep venous thrombosis) ([place])   . Dyspnea    "cause I'm over weight"  . Esophageal reflux   . Gout   . Hyperlipidemia    under control  . Hypertension   . Hypothyroidism   . Morbid obesity with BMI of 40.0-44.9, adult ([place])   . Nocturia   . OSA on CPAP    "not wearing it when I sleep in my lift chair" (07/14/2017)  . Osteoarthritis   . Phlebitis    hx of  . Pneumonia ?1989  . Spondylosis    with scoliosis  . Type II diabetes mellitus (H[facility])    borderline , diet  controlled   . Urinary frequency   . Uterine cancer (H[facility])    S/P chemo, radiation, hysterectomy    Past Surgical History:  Procedure Laterality Date  . APPENDECTOMY    . CATARACT EXTRACTION, BILATERAL Bilateral 2018  . COLONOSCOPY    . DILATATION & CURETTAGE/HYSTEROSCOPY WITH MYOSURE N/A 03/04/2016   Procedure: DILATATION & CURETTAGE/HYSTEROSCOPY ;  Surgeon: Azucena Fallen, MD;  Location: [facility];  Service: Gynecology;  Laterality: N/A;  polyp removal   . ESOPHAGOGASTRODUODENOSCOPY (EGD) WITH PROPOFOL N/A 11/14/2018   Procedure: ESOPHAGOGASTRODUODENOSCOPY (EGD) WITH PROPOFOL;  Surgeon: Wonda Horner, MD;  Location: [facility];  Service: Endoscopy;  Laterality: N/A;  . FEMUR IM NAIL Right 04/09/2019   Procedure: INTRAMEDULLARY (IM) NAIL INTERTROCH;  Surgeon: Renette Butters, MD;  Location: [facility];  Service: Orthopedics;  Laterality: Right;  . HERNIA REPAIR    . IR GENERIC HISTORICAL  05/15/2016   IR US GUIDE VASC ACCESS RIGHT 05/15/2016 Aletta Edouard, MD [facility]  . IR GENERIC HISTORICAL  05/15/2016   IR FLUORO GUIDE CV LINE RIGHT 05/15/2016 Aletta Edouard, MD [facility]  . IR REMOVAL TUN ACCESS W/ PORT W/O FL MOD SED  04/19/2018  . LAPAROSCOPIC CHOLECYSTECTOMY  2004  . LAPAROSCOPIC INCISIONAL / UMBILICAL / VENTRAL HERNIA REPAIR  2004   "w/gallbladder OR"  . LYMPH NODE BIOPSY N/A 03/26/2016  Procedure: Sent[place] LYMPH NODE BIOPSY;  Surgeon: Everitt Amber, MD;  Location: [facility];  Service: Gynecology;  Laterality: N/A;  . REVERSE SHOULDER ARTHROPLASTY Right 10/26/2018   Procedure: REVERSE SHOULDER ARTHROPLASTY;  Surgeon: Nicholes Stairs, MD;  Location: [facility];  Service: Orthopedics;  Laterality: Right;  . ROBOTIC ASSISTED TOTAL HYSTERECTOMY WITH BILATERAL SALPINGO OOPHERECTOMY N/A 03/26/2016   Procedure: XI ROBOTIC ASSISTED TOTAL Laproscopic HYSTERECTOMY WITH BILATERAL SALPINGO OOPHORECTOMY;  Surgeon: Everitt Amber, MD;  Location: [facility];  Service: Gynecology;  Laterality: N/A;  . TOTAL  KNEE ARTHROPLASTY Left 10/09/2013   Procedure: LEFT TOTAL KNEE ARTHROPLASTY;  Surgeon: [name], MD;  Location: [facility];  Service: Orthopedics;  Laterality: Left;  . TOTAL KNEE ARTHROPLASTY Right 04/09/2014   Procedure: RIGHT TOTAL KNEE ARTHROPLASTY;  Surgeon: [name], MD;  Location: [facility];  Service: Orthopedics;  Laterality: Right;  . TUBAL LIGATION  1982  . VARICOSE VEIN SURGERY Bilateral 1968  . WISDOM TOOTH EXTRACTION  1963    Family History  Problem Relation Age of Onset  . Heart disease Mother   . Heart disease Father   . CVA Father     Social History:  reports that she has never smoked. She has never used smokeless tobacco. She reports that she does not drink alcohol and does not use drugs.  Allergies:  Allergies  Allergen Reactions  . Oxybutynin Other (See Comments)    Bleeding gums, felt sick    Medications:  No current facility-administered medications on file prior to encounter.   Current Outpatient Medications on File Prior to Encounter  Medication Sig Dispense Refill  . acetaminophen (TYLENOL) 325 MG tablet Take 2 tablets (650 mg total) by mouth every 6 (six) hours as needed for mild pain or moderate pain (pain score 1-3 or temp > 100.5). 40 tablet 0  . albuterol (VENTOLIN HFA) 108 (90 Base) MCG/ACT inhaler Inhale 2 puffs into the lungs every 6 (six) hours as needed for wheezing or shortness of breath.    Marland Kitchen alendronate (FOSAMAX) 70 MG tablet Take 70 mg by mouth every Friday. Take with a full glass of water on an empty stomach.     Marland Kitchen allopurinol (ZYLOPRIM) 300 MG tablet Take 300 mg by mouth daily.     . ARTIFICIAL TEAR OP Place 1 drop into both eyes in the morning, at noon, in the evening, and at bedtime.    Marland Kitchen aspirin EC 81 MG tablet Take 81 mg by mouth daily. Swallow whole.    . baclofen (LIORESAL) 10 MG tablet Take 1 tablet (10 mg total) by mouth 3 (three) times daily as needed for muscle spasms. (Patient taking differently: Take 10 mg by mouth at  bedtime. ) 20 tablet 0  . calcium carbonate (TUMS - DOSED IN MG ELEMENTAL CALCIUM) 500 MG chewable tablet Chew 1 tablet by mouth daily.    . cetirizine (ZYRTEC) 10 MG tablet Take 10 mg by mouth daily.    . citalopram (CELEXA) 10 MG tablet Take 10 mg by mouth daily.    . furosemide (LASIX) 40 MG tablet Take 40 mg by mouth daily.     Marland Kitchen HYDROcodone-acetaminophen (NORCO) 5-325 MG tablet Take 1-2 tablets by mouth every 6 (six) hours as needed for severe pain (Use tylenol for mild and moderate pain). (Patient taking differently: Take 1-2 tablets by mouth every 6 (six) hours as needed for moderate pain or severe pain. ) 30 tablet 0  . hydrOXYzine (ATARAX/VISTARIL) 25 MG tablet Take 25 mg  by mouth at bedtime.    . iron polysaccharides (NIFEREX) 150 MG capsule Take 150 mg by mouth daily.    Marland Kitchen levothyroxine (SYNTHROID, LEVOTHROID) 125 MCG tablet Take 125 mcg by mouth daily at 6 (six) AM.     . metoCLOPramide (REGLAN) 5 MG tablet Take 5 mg by mouth 4 (four) times daily.    Marland Kitchen nystatin (NYSTATIN) powder Apply 1 Bottle topically See admin instructions. Apply topically to abdominal skin folds and under bilateral breast for intertrigo - twice daily - please make sure skin has been cleaned and is dry before applying    . Omega-3 Fatty Acids (FISH OIL) 1000 MG CAPS Take 1,000 mg by mouth daily.     . OXYGEN Inhale 2 L into the lungs continuous. To maintain O2 sat greater then 90% every shift    . pantoprazole (PROTONIX) 40 MG tablet Take 1 tablet (40 mg total) by mouth 2 (two) times daily before a meal. 60 tablet 1  . pravastatin (PRAVACHOL) 10 MG tablet Take 10 mg by mouth every Thursday. At bedtime    . pyridOXINE (VITAMIN B-6) 100 MG tablet Take 100 mg by mouth daily.    Marland Kitchen saccharomyces boulardii (FLORASTOR) 250 MG capsule Take 250 mg by mouth 2 (two) times daily.    Marland Kitchen senna-docusate (SENOKOT-S) 8.6-50 MG tablet Take 1 tablet by mouth daily.    Marland Kitchen spironolactone (ALDACTONE) 25 MG tablet Take 25 mg by mouth daily.     Marland Kitchen tolterodine (DETROL LA) 4 MG 24 hr capsule Take 4 mg by mouth every evening.     . traMADol (ULTRAM) 50 MG tablet Take 50 mg by mouth every 8 (eight) hours as needed for moderate pain.    . vitamin B-12 (CYANOCOBALAMIN) 1000 MCG tablet Take 1,000 mcg by mouth daily.     . vitamin C (ASCORBIC ACID) 500 MG tablet Take 500 mg by mouth daily.    . Vitamin D, Ergocalciferol, (DRISDOL) 1.25 MG (50000 UT) CAPS capsule Take 50,000 Units by mouth every 7 (seven) days. Monday    . zolpidem (AMBIEN) 10 MG tablet Take 10 mg by mouth at bedtime.    . docusate sodium (COLACE) 100 MG capsule Take 1 capsule (100 mg total) by mouth 2 (two) times daily. (Patient not taking: Reported on 05/08/2020) 10 capsule 0  . polyethylene glycol (MIRALAX / GLYCOLAX) packet Take 17 g by mouth daily as needed for mild constipation or moderate constipation. (Patient not taking: Reported on 05/08/2020) 14 each 0    ROS: Constitutional: No fever or chills Vision: No changes in vision ENT: No difficulty swallowing CV: No chest pain Pulm: No SOB or wheezing GI: No nausea or vomiting GU: No urgency or inability to hold urine Skin: No poor wound healing Neurologic: No numbness or tingling Psychiatric: No depression or anxiety Heme: No bruising Allergic: No reaction to medications or food   Exam: Blood pressure 133/85, pulse 82, temperature 98.3 F (36.8 C), temperature source Oral, resp. rate 17, SpO2 94 %. General: No acute distress Orientation: Awake and alert Mood and Affect: Cooperative and pleasant Gait: Unable to ambulate due to her fracture Coordination and balance: Within normal limits  Right lower extremity: Reveals previous anterior total knee incision is healed.  Compartments are soft compressible.  Active dorsiflexion plantarflexion of her foot and toes.  Sensation intact to light touch to the dorsum and plantar aspect of her foot.  Pain with any attempted range of motion or movement of the leg.  Unable  to  perform any stability exam.  Left lower extremity: Skin without lesions. No tenderness to palpation. Full painless ROM, full strength in each muscle groups without evidence of instability.   Medical Decision Making: Data: Imaging: X-rays and CT scan are reviewed which shows a periprosthetic distal femur fracture around the previous distal interlocking lower end of the hip nail.  Labs:  Results for orders placed or performed during the hospital encounter of 05/08/20 (from the past 24 hour(s))  Basic metabolic panel     Status: Abnormal   Collection Time: 05/08/20  4:44 PM  Result Value Ref Range   Sodium 139 135 - 145 mmol/L   Potassium 4.1 3.5 - 5.1 mmol/L   Chloride 96 (L) 98 - 111 mmol/L   CO2 30 22 - 32 mmol/L   Glucose, Bld 144 (H) 70 - 99 mg/dL   BUN 15 8 - 23 mg/dL   Creatinine, Ser 0.81 0.44 - 1.00 mg/dL   Calcium 9.2 8.9 - 10.3 mg/dL   GFR calc non Af Amer >60 >60 mL/min   GFR calc Af Amer >60 >60 mL/min   Anion gap 13 5 - 15  CBC with Differential     Status: None   Collection Time: 05/08/20  4:44 PM  Result Value Ref Range   WBC 9.4 4.0 - 10.5 K/uL   RBC 4.19 3.87 - 5.11 MIL/uL   Hemoglobin 12.8 12.0 - 15.0 g/dL   HCT 41.5 36 - 46 %   MCV 99.0 80.0 - 100.0 fL   MCH 30.5 26.0 - 34.0 pg   MCHC 30.8 30.0 - 36.0 g/dL   RDW 13.6 11.5 - 15.5 %   Platelets 281 150 - 400 K/uL   nRBC 0.0 0.0 - 0.2 %   Neutrophils Relative % 78 %   Neutro Abs 7.4 1.7 - 7.7 K/uL   Lymphocytes Relative 12 %   Lymphs Abs 1.1 0.7 - 4.0 K/uL   Monocytes Relative 9 %   Monocytes Absolute 0.9 0 - 1 K/uL   Eosinophils Relative 0 %   Eosinophils Absolute 0.0 0 - 0 K/uL   Basophils Relative 0 %   Basophils Absolute 0.0 0 - 0 K/uL   Immature Granulocytes 1 %   Abs Immature Granulocytes 0.05 0.00 - 0.07 K/uL  Protime-INR     Status: None   Collection Time: 05/08/20  4:44 PM  Result Value Ref Range   Prothrombin Time 13.2 11.4 - 15.2 seconds   INR 1.0 0.8 - 1.2  SARS Coronavirus 2 by RT PCR  (hospital order, performed in [facility] [place] hospital lab) Nasopharyngeal Nasopharyngeal Swab     Status: None   Collection Time: 05/08/20  6:34 PM   Specimen: Nasopharyngeal Swab  Result Value Ref Range   SARS Coronavirus 2 NEGATIVE NEGATIVE  Comprehensive metabolic panel     Status: Abnormal   Collection Time: 05/09/20  3:21 AM  Result Value Ref Range   Sodium 140 135 - 145 mmol/L   Potassium 3.9 3.5 - 5.1 mmol/L   Chloride 98 98 - 111 mmol/L   CO2 31 22 - 32 mmol/L   Glucose, Bld 151 (H) 70 - 99 mg/dL   BUN 15 8 - 23 mg/dL   Creatinine, Ser 0.90 0.44 - 1.00 mg/dL   Calcium 9.5 8.9 - 10.3 mg/dL   Total Protein 5.9 (L) 6.5 - 8.1 g/dL   Albumin 2.9 (L) 3.5 - 5.0 g/dL   AST 56 (H) 15 - 41 U/L  ALT 54 (H) 0 - 44 U/L   Alkaline Phosphatase 155 (H) 38 - 126 U/L   Total Bilirubin 2.0 (H) 0.3 - 1.2 mg/dL   GFR calc non Af Amer 59 (L) >60 mL/min   GFR calc Af Amer >60 >60 mL/min   Anion gap 11 5 - 15  CBC     Status: Abnormal   Collection Time: 05/09/20  3:21 AM  Result Value Ref Range   WBC 10.6 (H) 4.0 - 10.5 K/uL   RBC 4.07 3.87 - 5.11 MIL/uL   Hemoglobin 12.7 12.0 - 15.0 g/dL   HCT 39.8 36 - 46 %   MCV 97.8 80.0 - 100.0 fL   MCH 31.2 26.0 - 34.0 pg   MCHC 31.9 30.0 - 36.0 g/dL   RDW 13.5 11.5 - 15.5 %   Platelets 322 150 - 400 K/uL   nRBC 0.0 0.0 - 0.2 %  Surgical pcr screen     Status: None   Collection Time: 05/09/20  5:04 AM   Specimen: Nasal Mucosa; Nasal Swab  Result Value Ref Range   MRSA, PCR NEGATIVE NEGATIVE   Staphylococcus aureus NEGATIVE NEGATIVE  Glucose, capillary     Status: Abnormal   Collection Time: 05/09/20  1:09 PM  Result Value Ref Range   Glucose-Capillary 149 (H) 70 - 99 mg/dL    Imaging or Labs ordered: None  Medical history and chart was reviewed and case discussed with medical provider.  Assessment/Plan: 85 year old female with a periprosthetic right distal femur fracture.  Due to the unstable nature of her injury I recommend proceeding  with open reduction internal fixation.  Risks and benefits were discussed with the patient and her niece.  They agreed to proceed with surgery.  We will plan to remove the distal interlocking and fix around the femoral nail.  Greater than 50 minutes was spent coordinating care with the emergency room provider, Dr. Percell Miller, obtaining history from the niece as well as the patient and performing physical exam.  Shona Needles, MD [facility] [phone] (office) [email or web address]

## 2020-05-09 NOTE — Progress Notes (Signed)
[facility] Progress Note  Patient: Melinda Kerr    PIR:[id number]  DOA: 05/08/2020     Date of Service: the patient was seen and examined on 05/09/2020  Brief hospital course: HFpEF, DVT, TKR, type II DM, HTN, hypothyroidism, obesity, OSA, HLD, GERD, endometrial cancer presents with a fall found to have right femur fracture. Currently plan is monitor postop recovery.  Assessment and Plan: 1 closed distal femoral fracture:  Periprostatic. Patient has had previous surgery.   Appreciate orthopedic assistance. Patient underwent  ORIF and removal of the hardware. Pain control, weightbearing status per orthopedics. PT OT per orthopedics. Continue bowel regimen.  2 osteoarthritis:  ontinue with regimen as per home.  3 hypertension:  Continue blood pressure control using home regimen.  4 hypothyroidism:  Continue to replace her thyroid.  5 GERD:  Continue with PPIs.  6 chronic HFpEF Patient on Lasix.  Compensated at the moment.  7 obstructive sleep apnea:  Offer CPAP   8 morbid obesity: Dietary counseling once more awake. Body mass index is 45.53 kg/m.    Interventions:        Diet: Cardiac diet DVT Prophylaxis:   enoxaparin (LOVENOX) injection 40 mg Start: 05/10/20 0800 SCDs Start: 05/09/20 1421 SCDs Start: 05/08/20 1948    Advance goals of care discussion: Full code  Family Communication: no family was present at bedside, at the time of interview.   Disposition:  Status is: Inpatient  Remains inpatient appropriate because:IV treatments appropriate due to intensity of illness or inability to take PO   Dispo: The patient is from: Home              Anticipated d/c is to: SNF              Anticipated d/c date is: 2 days              Patient currently is not medically stable to d/c.        Subjective: No nausea no vomiting.  Seen after the surgery.  Denies any acute complaint but continues to complain of pain since admission.  No fever no  chills.  Physical Exam:  General: Appear in moderate distress, no Rash; Oral Mucosa Clear, moist. no Abnormal Neck Mass Or lumps, Conjunctiva normal  Cardiovascular: S1 and S2 Present, no Murmur, Respiratory: good respiratory effort, Bilateral Air entry present and Clear to Auscultation, no Crackles, no wheezes Abdomen: Bowel Sound present, Soft and no tenderness Extremities: no Pedal edema, no calf tenderness Neurology: alert and oriented to time, place, and person affect appropriate. no new focal deficit Gait not checked due to patient safety concerns  Vitals:   05/09/20 1323 05/09/20 1338 05/09/20 1353 05/09/20 1430  BP: 134/74 134/81 137/90   Pulse: 77 79 82   Resp: 16 18 18    Temp:   98.2 F (36.8 C)   TempSrc:      SpO2: 99% 97% 97%   Weight:    116.6 kg  Height:    5\' 3"  (1.6 m)    Intake/Output Summary (Last 24 hours) at 05/09/2020 1940 Last data filed at 05/09/2020 1559 Gross per 24 hour  Intake 1919.21 ml  Output 850 ml  Net 1069.21 ml   Filed Weights   05/09/20 1430  Weight: 116.6 kg    Data Reviewed: I have personally reviewed and interpreted daily labs, tele strips, imagings as discussed above. I reviewed all nursing notes, pharmacy notes, vitals, pertinent old records I have discussed plan of care as described  above with RN and patient/family.  CBC: Recent Labs  Lab 05/08/20 1644 05/09/20 0321  WBC 9.4 10.6*  NEUTROABS 7.4  --   HGB 12.8 12.7  HCT 41.5 39.8  MCV 99.0 97.8  PLT 281 350   Basic Metabolic Panel: Recent Labs  Lab 05/08/20 1644 05/09/20 0321  NA 139 140  K 4.1 3.9  CL 96* 98  CO2 30 31  GLUCOSE 144* 151*  BUN 15 15  CREATININE 0.81 0.90  CALCIUM 9.2 9.5    Studies: DG C-Arm 1-60 Min  Result Date: 05/09/2020 CLINICAL DATA:  Open reduction internal fixation distal femur fracture. EXAM: RIGHT FEMUR 2 VIEWS; DG C-ARM 1-60 MIN COMPARISON:  Plain film of the RIGHT femur dated 05/08/2020 FINDINGS: Six intraoperative  fluoroscopic images of the RIGHT femur are provided. Plate and screw fixation hardware appears appropriately positioned, traversing the distal femur fracture site. Intramedullary rod remains in place. RIGHT knee arthroplasty hardware is partially imaged and appears appropriately positioned. IMPRESSION: Intraoperative fluoroscopic images demonstrating appropriate positioning of the plate and screw fixation hardware which traverses the distal femur fracture site. No evidence of surgical complicating feature. Electronically Signed   By: Franki Cabot M.D.   On: 05/09/2020 14:39   DG FEMUR, MIN 2 VIEWS RIGHT  Result Date: 05/09/2020 CLINICAL DATA:  Open reduction internal fixation distal femur fracture. EXAM: RIGHT FEMUR 2 VIEWS; DG C-ARM 1-60 MIN COMPARISON:  Plain film of the RIGHT femur dated 05/08/2020 FINDINGS: Six intraoperative fluoroscopic images of the RIGHT femur are provided. Plate and screw fixation hardware appears appropriately positioned, traversing the distal femur fracture site. Intramedullary rod remains in place. RIGHT knee arthroplasty hardware is partially imaged and appears appropriately positioned. IMPRESSION: Intraoperative fluoroscopic images demonstrating appropriate positioning of the plate and screw fixation hardware which traverses the distal femur fracture site. No evidence of surgical complicating feature. Electronically Signed   By: Franki Cabot M.D.   On: 05/09/2020 14:39   DG FEMUR PORT, MIN 2 VIEWS RIGHT  Result Date: 05/09/2020 CLINICAL DATA:  Periprostatic distal femur fracture status post fixation. EXAM: RIGHT FEMUR PORTABLE 2 VIEW COMPARISON:  Plain film of the RIGHT femur dated 05/08/2020. FINDINGS: Plate and screw fixation hardware appears intact and appropriately positioned, traversing the distal femur fracture site. Intramedullary rod appears intact and appropriately positioned. Distal femur fracture appears stable in alignment. IMPRESSION: Status post plate and screw  fixation of the distal femur fracture. No evidence of surgical complicating feature. Electronically Signed   By: Franki Cabot M.D.   On: 05/09/2020 16:43    Scheduled Meds: . [START ON 05/10/2020] aspirin EC  81 mg Oral Daily  . [START ON 05/10/2020] calcium carbonate  1 tablet Oral Daily  . citalopram  10 mg Oral Daily  . docusate sodium  100 mg Oral BID  . [START ON 05/10/2020] enoxaparin (LOVENOX) injection  40 mg Subcutaneous Q24H  . fentaNYL      . hydrOXYzine  25 mg Oral QHS  . [START ON 05/10/2020] levothyroxine  125 mcg Oral Q0600  . pantoprazole  40 mg Oral Daily  . pravastatin  10 mg Oral Q Thu   Continuous Infusions: . sodium chloride 100 mL/hr at 05/08/20 2020  .  ceFAZolin (ANCEF) IV     PRN Meds: [START ON 05/10/2020] acetaminophen, albuterol, diphenhydrAMINE, HYDROcodone-acetaminophen, HYDROcodone-acetaminophen, metoCLOPramide **OR** metoCLOPramide (REGLAN) injection, morphine injection, ondansetron **OR** ondansetron (ZOFRAN) IV, polyethylene glycol, zolpidem  Time spent: 35 minutes  Author: Berle Mull, MD [facility] 05/09/2020 7:40 PM  To reach On-call, see care teams to locate the attending and reach out via [email or web address]. Between 7PM-7AM, please contact night-coverage If you still have difficulty reaching the attending provider, please page the [facility] (Director on Call) for [facility] on amion for assistance.

## 2020-05-09 NOTE — Anesthesia Preprocedure Evaluation (Addendum)
Anesthesia Evaluation  Patient identified by MRN, date of birth, ID band Patient awake    Reviewed: Allergy & Precautions, NPO status , Patient's Chart, lab work & pertinent test results  Airway Mallampati: II  TM Distance: >3 FB Neck ROM: Full    Dental  (+) Teeth Intact, Caps, Chipped,    Pulmonary shortness of breath, sleep apnea ,    Pulmonary exam normal breath sounds clear to auscultation       Cardiovascular hypertension, Pt. on medications + CAD and +CHF  Normal cardiovascular exam Rhythm:Regular Rate:Normal     Neuro/Psych PSYCHIATRIC DISORDERS Depression TIACVA    GI/Hepatic Neg liver ROS, hiatal hernia, GERD  Medicated,  Endo/Other  diabetes, Well Controlled, Type 2Hypothyroidism Morbid obesity  Renal/GU negative Renal ROS     Musculoskeletal  (+) Arthritis , Right periprosthetic distal femur fracture   Abdominal   Peds  Hematology negative hematology ROS (+)   Anesthesia Other Findings Day of surgery medications reviewed with the patient.  Reproductive/Obstetrics Uterine cancer (HCC)  S/P chemo, radiation, hysterectomy                            Anesthesia Physical Anesthesia Plan  ASA: III  Anesthesia Plan: General   Post-op Pain Management:    Induction: Intravenous  PONV Risk Score and Plan: 3 and Dexamethasone, Ondansetron and Treatment may vary due to age or medical condition  Airway Management Planned: Oral ETT  Additional Equipment:   Intra-op Plan:   Post-operative Plan: Extubation in OR  Informed Consent: I have reviewed the patients History and Physical, chart, labs and discussed the procedure including the risks, benefits and alternatives for the proposed anesthesia with the patient or authorized representative who has indicated his/her understanding and acceptance.       Plan Discussed with: CRNA  Anesthesia Plan Comments:         Anesthesia  Quick Evaluation

## 2020-05-09 NOTE — Anesthesia Postprocedure Evaluation (Signed)
Anesthesia Post Note  Patient: Melinda Kerr  Procedure(s) Performed: OPEN REDUCTION INTERNAL FIXATION (ORIF) DISTAL FEMUR FRACTURE (Right Leg Upper) HARDWARE REMOVAL RIGHT FEMUR (Right Leg Upper)     Patient location during evaluation: PACU Anesthesia Type: General Level of consciousness: awake Pain management: pain level controlled Vital Signs Assessment: post-procedure vital signs reviewed and stable Respiratory status: spontaneous breathing, nonlabored ventilation, respiratory function stable and patient connected to nasal cannula oxygen Cardiovascular status: blood pressure returned to baseline and stable Postop Assessment: no apparent nausea or vomiting Anesthetic complications: no   No complications documented.  Last Vitals:  Vitals:   05/09/20 1338 05/09/20 1353  BP: 134/81 137/90  Pulse: 79 82  Resp: 18 18  Temp:  36.8 C  SpO2: 97% 97%    Last Pain:  Vitals:   05/09/20 1552  TempSrc:   PainSc: 7                  [name] P [name]

## 2020-05-09 NOTE — Plan of Care (Signed)

## 2020-05-09 NOTE — Anesthesia Procedure Notes (Signed)
Procedure Name: Intubation Date/Time: 05/09/2020 11:03 AM Performed by: Glynda Jaeger, CRNA Pre-anesthesia Checklist: Patient identified, Patient being monitored, Timeout performed, Emergency Drugs available and Suction available Patient Re-evaluated:Patient Re-evaluated prior to induction Oxygen Delivery Method: Circle System Utilized Preoxygenation: Pre-oxygenation with 100% oxygen Induction Type: IV induction Ventilation: Mask ventilation without difficulty Laryngoscope Size: 3 and Glidescope Grade View: Grade I Tube type: Oral Tube size: 7.0 mm Number of attempts: 1 Airway Equipment and Method: Video-laryngoscopy Placement Confirmation: ETT inserted through vocal cords under direct vision,  positive ETCO2 and breath sounds checked- equal and bilateral Secured at: 22 cm Tube secured with: Tape Dental Injury: Teeth and Oropharynx as per pre-operative assessment

## 2020-05-10 ENCOUNTER — Encounter (HOSPITAL_COMMUNITY): Payer: Self-pay | Admitting: Student

## 2020-05-10 LAB — CBC
HCT: 36.1 % (ref 36.0–46.0)
Hemoglobin: 11.4 g/dL — ABNORMAL LOW (ref 12.0–15.0)
MCH: 31.8 pg (ref 26.0–34.0)
MCHC: 31.6 g/dL (ref 30.0–36.0)
MCV: 100.8 fL — ABNORMAL HIGH (ref 80.0–100.0)
Platelets: 334 10*3/uL (ref 150–400)
RBC: 3.58 MIL/uL — ABNORMAL LOW (ref 3.87–5.11)
RDW: 13.9 % (ref 11.5–15.5)
WBC: 10.1 10*3/uL (ref 4.0–10.5)
nRBC: 0 % (ref 0.0–0.2)

## 2020-05-10 LAB — BASIC METABOLIC PANEL
Anion gap: 10 (ref 5–15)
BUN: 14 mg/dL (ref 8–23)
CO2: 32 mmol/L (ref 22–32)
Calcium: 9 mg/dL (ref 8.9–10.3)
Chloride: 99 mmol/L (ref 98–111)
Creatinine, Ser: 0.98 mg/dL (ref 0.44–1.00)
GFR calc Af Amer: >60 mL/min (ref >60)
GFR calc non Af Amer: 53 mL/min — ABNORMAL LOW (ref >60)
Glucose, Bld: 147 mg/dL — ABNORMAL HIGH (ref 70–99)
Potassium: 4 mmol/L (ref 3.5–5.1)
Sodium: 141 mmol/L (ref 135–145)

## 2020-05-10 LAB — VITAMIN D 25 HYDROXY (VIT D DEFICIENCY, FRACTURES): Vit D, 25-Hydroxy: 66.71 ng/mL (ref 30–100)

## 2020-05-10 MED ORDER — HYDROCODONE-ACETAMINOPHEN 5-325 MG PO TABS
1.0000 | ORAL_TABLET | ORAL | Status: DC | PRN
  Administered 2020-05-10 – 2020-05-11 (×4): 1 via ORAL
  Filled 2020-05-10 (×4): qty 1

## 2020-05-10 NOTE — Plan of Care (Signed)
°  Problem: Health Behavior/Discharge Planning: Goal: Ability to manage health-related needs will improve Outcome: Not Progressing   Problem: Activity: Goal: Risk for activity intolerance will decrease Outcome: Not Progressing   Problem: Pain Managment: Goal: General experience of comfort will improve Outcome: Progressing

## 2020-05-10 NOTE — Progress Notes (Signed)
[facility] Progress Note  S: Having some pain but controlled. No major issues overnight  O:  Vitals:   05/09/20 1930 05/10/20 0500  BP: 131/61 130/70  Pulse: 78 72  Resp: 17 16  Temp: 98.4 F (36.9 C) 98.1 F (36.7 C)  SpO2: 93% 94%    GEN: NAD RLE: Dressing in place, clean and dry. Moves toes and foot and endorses sensation. Warm and well perfused foot. Does not tolerate ROM of knee secondary to pain  Imaging: Stable postop imaging  Labs:  Results for orders placed or performed during the hospital encounter of 05/08/20 (from the past 24 hour(s))  Glucose, capillary     Status: Abnormal   Collection Time: 05/09/20  1:09 PM  Result Value Ref Range   Glucose-Capillary 149 (H) 70 - 99 mg/dL  Basic metabolic panel     Status: Abnormal   Collection Time: 05/10/20  3:41 AM  Result Value Ref Range   Sodium 141 135 - 145 mmol/L   Potassium 4.0 3.5 - 5.1 mmol/L   Chloride 99 98 - 111 mmol/L   CO2 32 22 - 32 mmol/L   Glucose, Bld 147 (H) 70 - 99 mg/dL   BUN 14 8 - 23 mg/dL   Creatinine, Ser 0.98 0.44 - 1.00 mg/dL   Calcium 9.0 8.9 - 10.3 mg/dL   GFR calc non Af Amer 53 (L) >60 mL/min   GFR calc Af Amer >60 >60 mL/min   Anion gap 10 5 - 15  CBC     Status: Abnormal   Collection Time: 05/10/20  3:41 AM  Result Value Ref Range   WBC 10.1 4.0 - 10.5 K/uL   RBC 3.58 (L) 3.87 - 5.11 MIL/uL   Hemoglobin 11.4 (L) 12.0 - 15.0 g/dL   HCT 36.1 36 - 46 %   MCV 100.8 (H) 80.0 - 100.0 fL   MCH 31.8 26.0 - 34.0 pg   MCHC 31.6 30.0 - 36.0 g/dL   RDW 13.9 11.5 - 15.5 %   Platelets 334 150 - 400 K/uL   nRBC 0.0 0.0 - 0.2 %    Assessment: 85 yo female s/p fall  Injuries: Right periprosthetic distal femur fracture s/p ORIF  Weightbearing: WBAT RLE  Insicional and dressing care: Dressing to be changed tomorrow vs Sunday  Orthopedic device(s):None  CV/Blood loss:Hgb 11.4 this AM, continue to monitor  Pain management: 1. Hydrocodone 5/325mg  q4hrs PRN 2. Morphine 0.5-1 mg q  2hrs PRN 3. Tylenol PRN for mild pain  VTE prophylaxis: Lovenox 40 mg to start today  ID: [email or web address] to complete today  Foley/Lines: None  Medical co-morbidities: per hospitalists  Impediments to Fracture Healing: vitamin d level pending  Dispo: SNF  Follow - up plan: 2 weeks  Shona Needles, MD [facility] [phone] (office) [email or web address]

## 2020-05-10 NOTE — Progress Notes (Signed)
[facility] Progress Note  Patient: Melinda Kerr    RFF:[id number]  DOA: 05/08/2020     Date of Service: the patient was seen and examined on 05/10/2020  Brief hospital course: HFpEF, DVT, TKR, type II DM, HTN, hypothyroidism, obesity, OSA, HLD, GERD, endometrial cancer presents with a fall found to have right femur fracture. Currently plan is monitor postop recovery.  Assessment and Plan: 1 closed distal femoral fracture:  Periprostatic. Patient has had previous surgery.   Appreciate orthopedic assistance. Patient underwent  ORIF and removal of the hardware. Pain control, weightbearing status per orthopedics. PT OT per orthopedics. Continue bowel regimen.  2 osteoarthritis:  ontinue with regimen as per home.  3 hypertension:  Continue blood pressure control using home regimen.  4 hypothyroidism:  Continue to replace her thyroid.  5 GERD:  Continue with PPIs.  6 chronic HFpEF Patient on Lasix.  Compensated at the moment.  7 obstructive sleep apnea:  Offer CPAP   8 morbid obesity:  Dietary counseling Body mass index is 45.53 kg/m.    Interventions:        Diet: Cardiac diet DVT Prophylaxis:   enoxaparin (LOVENOX) injection 40 mg Start: 05/10/20 0800 SCDs Start: 05/09/20 1421 SCDs Start: 05/08/20 1948    Advance goals of care discussion: Full code  Family Communication: no family was present at bedside, at the time of interview.   Disposition:  Status is: Inpatient  Remains inpatient appropriate because:IV treatments appropriate due to intensity of illness or inability to take PO   Dispo: The patient is from: Home              Anticipated d/c is to: SNF              Anticipated d/c date is: 2 days              Patient currently is not medically stable to d/c.  Subjective: Pain well controlled.  No nausea no vomiting.  No fever no chills.  Physical Exam:  General: Appear in mild distress, no Rash; Oral Mucosa Clear, moist. no  Abnormal Neck Mass Or lumps, Conjunctiva normal  Cardiovascular: S1 and S2 Present, no Murmur, Respiratory: good respiratory effort, Bilateral Air entry present and Clear to Auscultation, no Crackles, no wheezes Abdomen: Bowel Sound present, Soft and no tenderness Extremities: no Pedal edema, no calf tenderness Neurology: alert and oriented to time, place, and person affect appropriate. no new focal deficit Gait not checked due to patient safety concerns  Vitals:   05/10/20 0500 05/10/20 1216 05/10/20 1230 05/10/20 1959  BP: 130/70 (!) 94/40 129/68 (!) 110/56  Pulse: 72 71  70  Resp: 16 16  16   Temp: 98.1 F (36.7 C) 98.7 F (37.1 C)  98.3 F (36.8 C)  TempSrc: Axillary Oral    SpO2: 94% 96%  97%  Weight:      Height:        Intake/Output Summary (Last 24 hours) at 05/10/2020 2000 Last data filed at 05/10/2020 1804 Gross per 24 hour  Intake 2985.28 ml  Output 600 ml  Net 2385.28 ml   Filed Weights   05/09/20 1430  Weight: 116.6 kg    Data Reviewed: I have personally reviewed and interpreted daily labs, tele strips, imagings as discussed above. I reviewed all nursing notes, pharmacy notes, vitals, pertinent old records I have discussed plan of care as described above with RN and patient/family.  CBC: Recent Labs  Lab 05/08/20 1644 05/09/20 0321 05/10/20 0341  WBC 9.4 10.6* 10.1  NEUTROABS 7.4  --   --   HGB 12.8 12.7 11.4*  HCT 41.5 39.8 36.1  MCV 99.0 97.8 100.8*  PLT 281 322 909   Basic Metabolic Panel: Recent Labs  Lab 05/08/20 1644 05/09/20 0321 05/10/20 0341  NA 139 140 141  K 4.1 3.9 4.0  CL 96* 98 99  CO2 30 31 32  GLUCOSE 144* 151* 147*  BUN 15 15 14   CREATININE 0.81 0.90 0.98  CALCIUM 9.2 9.5 9.0    Studies: No results found.  Scheduled Meds: . aspirin EC  81 mg Oral Daily  . calcium carbonate  1 tablet Oral Daily  . citalopram  10 mg Oral Daily  . docusate sodium  100 mg Oral BID  . enoxaparin (LOVENOX) injection  40 mg Subcutaneous  Q24H  . hydrOXYzine  25 mg Oral QHS  . levothyroxine  125 mcg Oral Q0600  . pantoprazole  40 mg Oral Daily  . pravastatin  10 mg Oral Q Thu   Continuous Infusions: . sodium chloride 100 mL/hr at 05/09/20 2045   PRN Meds: acetaminophen, albuterol, diphenhydrAMINE, HYDROcodone-acetaminophen, morphine injection, ondansetron **OR** ondansetron (ZOFRAN) IV, polyethylene glycol, zolpidem  Time spent: 35 minutes  Author: Berle Mull, MD [facility] 05/10/2020 8:00 PM  To reach On-call, see care teams to locate the attending and reach out via [email or web address]. Between 7PM-7AM, please contact night-coverage If you still have difficulty reaching the attending provider, please page the [facility] (Director on Call) for [facility] on amion for assistance.

## 2020-05-10 NOTE — TOC Initial Note (Signed)
Transition of Care [facility]) - Initial/Assessment Note    Patient Details  Name: Melinda Kerr MRN: [id number] Date of Birth: [birth year]  Transition of Care [facility]) CM/SW Contact:    Curlene Labrum, RN Phone Number: 05/10/2020, 1:17 PM  Clinical Narrative:                 Case management met with the patient who is a S/P ORIF of spiral oblique distal femur fx on 05/09/2020 - who currently resides at [facility] and plans to return to the SNF after medical work up here at [facility]  I spoke with [facility] facility and updated the [facility] and sent it to them accordingly.  Will continue to follow the patient for transfer back when she is able.  Patient is up to date with [place].  Expected Discharge Plan: Skilled Nursing Facility Barriers to Discharge: Continued Medical Work up   Patient Goals and CMS Choice Patient states their goals for this hospitalization and ongoing recovery are:: Patient plans to transfer back to [facility] where she has lived for the past year. CMS Medicare.gov Compare Post Acute Care list provided to:: Patient Choice offered to / list presented to : Patient  Expected Discharge Plan and Services Expected Discharge Plan: [place]   Discharge Planning Services: CM Consult Post Acute Care Choice: [place] Living arrangements for the past 2 months: [place]                                      Prior Living Arrangements/Services Living arrangements for the past 2 months: [place] Lives with:: Facility Resident Patient language and need for interpreter reviewed:: Yes Do you feel safe going back to the place where you live?: Yes      Need for Family Participation in Patient Care: Yes (Comment) Care giver support system in place?: Yes (comment)   Criminal Activity/Legal Involvement Pertinent to Current Situation/Hospitalization: No - Comment as  needed  Activities of Daily Living Home Assistive Devices/Equipment: Environmental consultant (specify type), Wheelchair ADL Screening (condition at time of admission) Patient's cognitive ability adequate to safely complete daily activities?: No Is the patient deaf or have difficulty hearing?: Yes Does the patient have difficulty seeing, even when wearing glasses/contacts?: No Does the patient have difficulty concentrating, remembering, or making decisions?: No Patient able to express need for assistance with ADLs?: Yes Does the patient have difficulty dressing or bathing?: Yes Independently performs ADLs?: No Does the patient have difficulty walking or climbing stairs?: Yes Weakness of Legs: Both Weakness of Arms/Hands: Both  Permission Sought/Granted Permission sought to share information with : Case Manager Permission granted to share information with : Yes, Verbal Permission Granted     Permission granted to share info w AGENCY: [facility]  Permission granted to share info w Relationship: niece - [name] Yow     Emotional Assessment Appearance:: Appears stated age Attitude/Demeanor/Rapport: Gracious Affect (typically observed): Accepting Orientation: : Oriented to Self, Oriented to Place, Oriented to  Time, Oriented to Situation Alcohol / Substance Use: Not Applicable Psych Involvement: No (comment)  Admission diagnosis:  Right hip pain [M25.551] Closed fracture of right distal femur ([place]) [S72.401A] Fall, initial encounter [W19.XXXA] Acute pain of right knee [M25.561] Closed fracture of distal end of right femur, unspecified fracture morphology, initial encounter [facility]) [S72.401A] Patient Active Problem List   Diagnosis Date  Noted  . Closed fracture of right distal femur ([place]) 05/08/2020  . Acute CVA (cerebrovascular accident) ([place]) 05/15/2019  . TIA (transient ischemic attack) 05/14/2019  . Displaced comminuted fracture of shaft of right femur, initial encounter for closed fracture  ([place]) 04/08/2019  . GIB (gastrointestinal bleeding) 11/13/2018  . Anemia associated with acute blood loss 11/13/2018  . Closed fracture of right proximal humerus 10/26/2018  . S/P reverse total shoulder arthroplasty, right 10/26/2018  . Acute appendicitis   . Diverticulitis of large intestine with abscess 07/13/2017  . Colitis 07/13/2017  . Aortic atherosclerosis ([place]) 09/12/2016  . Coronary artery calcification 09/12/2016  . Hiatal hernia 09/12/2016  . Lumbar disc disease 09/12/2016  . Chemotherapy-induced thrombocytopenia 08/04/2016  . Chemotherapy induced neutropenia ([place]) 05/26/2016  . Port catheter in place 05/26/2016  . Encounter for antineoplastic chemotherapy 05/26/2016  . Urinary frequency 04/24/2016  . Obstructive sleep apnea on CPAP 04/24/2016  . History of DVT (deep vein thrombosis) 04/24/2016  . Endometrial cancer, FIGO stage IIIC ([place]) 04/24/2016  . Malignant neoplasm of endometrium ([place]) 03/26/2016  . Dyspnea on exertion 03/19/2016  . Malignant neoplasm of uterus ([place]) 03/19/2016  . Status post right knee replacement 04/16/2014  . Essential hypertension 04/16/2014  . Postoperative anemia due to acute blood loss 04/12/2014  . Status post total left knee replacement 10/14/2013  . Hypothyroidism 10/14/2013  . UI (urinary incontinence) 10/14/2013  . Dyslipidemia 10/14/2013  . Constipation 10/14/2013  . Gout 10/14/2013  . Anemia 10/14/2013  . Allergic rhinitis 10/14/2013  . Insomnia 10/14/2013  . Depression 10/14/2013  . OA (osteoarthritis) of knee 10/09/2013  . OSA on CPAP 08/10/2013  . Morbid obesity with BMI of 45.0-49.9, adult ([place])    PCP:  Maury Dus, MD Pharmacy:  No Pharmacies Listed    Social Determinants of Health (SDOH) Interventions    Readmission Risk Interventions Readmission Risk Prevention Plan 05/10/2020  Transportation Screening Complete  PCP or Specialist Appt within 5-7 Days Complete  Home Care Screening Complete  Medication Review  (RN CM) Complete  Some recent data might be hidden

## 2020-05-10 NOTE — Evaluation (Signed)
Physical Therapy Evaluation Patient Details Name: Melinda Kerr MRN: [id number] DOB: Oct 28, 1934 Today's Date: 05/10/2020   History of Present Illness  Pt is 85 yo female with pmh of CHF, DVT, TKA, hip fx with ORIF, DM, sleep apnea, and morbid obesity.  Pt presents s/pfall a week ago, inital imaging was negative, but she continued to have pain.  Pt was found to have spiral oblique distal femur fx and now s/p ORIF on 05/09/20.  Clinical Impression  Pt admitted with above diagnosis. Pt requiring total A x 2 for rolling and supine/sit.  Pt was unsafe to egress off bed due to short stature, morbid obesity, and tall air mattress with feet not reach floor.  For future treatments - recommend maximove to chair and then progress from there.  Pt presents with decreased mobility, strength, endurance, safety, ROM, and balance. Pt currently with functional limitations due to the deficits listed below (see PT Problem List). Pt will benefit from skilled PT to increase their independence and safety with mobility to allow discharge to the venue listed below.       Follow Up Recommendations SNF    Equipment Recommendations  None recommended by PT    Recommendations for Other Services       Precautions / Restrictions Precautions Precautions: Fall Precaution Comments: Morbid Obesity Restrictions RLE Weight Bearing: Weight bearing as tolerated      Mobility  Bed Mobility Overal bed mobility: Needs Assistance Bed Mobility: Rolling;Supine to Sit;Sit to Supine Rolling: Total assist;+2 for physical assistance   Supine to sit: Total assist;+2 for physical assistance Sit to supine: Total assist;+2 for physical assistance   General bed mobility comments: Pt reached toward rail but otherwise required total A of 2 for all mobility  Transfers                 General transfer comment: Deferred due to short stature, body habitus, and on bariatric air mattress with feet over a foot away from reaching  floor  Ambulation/Gait                Stairs            Wheelchair Mobility    Modified Rankin (Stroke Patients Only)       Balance Overall balance assessment: Needs assistance Sitting-balance support: Bilateral upper extremity supported Sitting balance-Leahy Scale: Zero Sitting balance - Comments: Sat EOB for 10 minutes with total A.  Again , some limitations due to body habitus/short stature and feet unable to reach floor       Standing balance comment: unable                             Pertinent Vitals/Pain Pain Assessment: Faces Faces Pain Scale: Hurts a little bit Pain Location: R femur Pain Descriptors / Indicators: Grimacing Pain Intervention(s): Limited activity within patient's tolerance;Monitored during session;Premedicated before session    Home Living Family/patient expects to be discharged to:: Skilled nursing facility                 Additional Comments: Pt from [facility] with plan to return    Prior Function Level of Independence: Needs assistance   Gait / Transfers Assistance Needed: Pt reports she was at SNF and ambulating short distances with assist.  States she could get up/down from lift chair without assist. Used RW.  ADL's / Homemaking Assistance Needed: Required assist for ADLs        Hand  Dominance        Extremity/Trunk Assessment   Upper Extremity Assessment Upper Extremity Assessment: Defer to OT evaluation;Generalized weakness    Lower Extremity Assessment Lower Extremity Assessment: LLE deficits/detail;RLE deficits/detail RLE Deficits / Details: ROM: ankle WFL, hip and knee limited due to pain and body habitus ; MMT : ankle 3/5, knee 2/5, hip 1/5 LLE Deficits / Details: ROM: WFL but some limitations due to body habitus; MMT : Ankle 5/5, knee 3/5, hip 2/5    Cervical / Trunk Assessment Cervical / Trunk Assessment: Other exceptions Cervical / Trunk Exceptions: Short stature and morbid obesity   Communication   Communication: No difficulties  Cognition Arousal/Alertness: Awake/alert Behavior During Therapy: WFL for tasks assessed/performed Overall Cognitive Status: Within Functional Limits for tasks assessed                                        General Comments General comments (skin integrity, edema, etc.): On 1 LPM O2 with sats 89-91% - assisted with use spirometer    Exercises General Exercises - Lower Extremity Ankle Circles/Pumps: AROM;Both;10 reps Long Arc Quad: AROM;AAROM;Both;5 reps;Seated   Assessment/Plan    PT Assessment Patient needs continued PT services  PT Problem List Decreased strength;Decreased safety awareness;Decreased mobility;Decreased range of motion;Decreased coordination;Decreased activity tolerance;Cardiopulmonary status limiting activity;Decreased balance;Decreased knowledge of use of DME;Pain       PT Treatment Interventions DME instruction;Therapeutic activities;Gait training;Therapeutic exercise;Patient/family education;Other (comment);Balance training;Functional mobility training (Recommend Maxi Move to chair and then work from there)    PT Goals (Current goals can be found in the Care Plan section)  Acute Rehab PT Goals Patient Stated Goal: return to [facility] PT Goal Formulation: With patient Time For Goal Achievement: 05/24/20 Potential to Achieve Goals: Fair Additional Goals Additional Goal #1: Pt will demonstrate 3/5 throughout bil LE to assist with transfers    Frequency Min 3X/week   Barriers to discharge        Co-evaluation PT/OT/SLP Co-Evaluation/Treatment: Yes Reason for Co-Treatment: Complexity of the patient's impairments (multi-system involvement);For patient/therapist safety PT goals addressed during session: Mobility/safety with mobility;Balance OT goals addressed during session: ADL's and self-care       AM-PAC PT "6 Clicks" Mobility  Outcome Measure Help needed turning from your back to your  side while in a flat bed without using bedrails?: Total Help needed moving from lying on your back to sitting on the side of a flat bed without using bedrails?: Total Help needed moving to and from a bed to a chair (including a wheelchair)?: Total Help needed standing up from a chair using your arms (e.g., wheelchair or bedside chair)?: Total Help needed to walk in hospital room?: Total Help needed climbing 3-5 steps with a railing? : Total 6 Click Score: 6    End of Session Equipment Utilized During Treatment: Oxygen Activity Tolerance: Patient tolerated treatment well Patient left: in bed;with call bell/phone within reach;with bed alarm set Nurse Communication: Mobility status;Need for lift equipment PT Visit Diagnosis: History of falling (Z91.81);Other abnormalities of gait and mobility (R26.89);Muscle weakness (generalized) (M62.81)    Time: 0814-4818 PT Time Calculation (min) (ACUTE ONLY): 29 min   Charges:   PT Evaluation $PT Eval Moderate Complexity: 1 Melina Schools, PT Acute Rehab Services Pager [phone] [facility] [phone]    Karlton Lemon 05/10/2020, 11:01 AM

## 2020-05-10 NOTE — Evaluation (Signed)
Occupational Therapy Evaluation Patient Details Name: Melinda Kerr MRN: [id number] DOB: 1935/02/20 Today's Date: 05/10/2020    History of Present Illness Pt is 85 yo female with pmh of CHF, DVT, TKA, hip fx with ORIF, DM, sleep apnea, and morbid obesity.  Pt presents s/pfall a week ago, inital imaging was negative, but she continued to have pain.  Pt was found to have spiral oblique distal femur fx and now s/p ORIF on 05/09/20.   Clinical Impression   Pt is a resident on [facility] and using a RW with assist for short distance ambulation. She was assisted for bathing, dressing and toileting and could groom and self feed. Pt presents with mild pain, impaired memory, generalized weakness, baseline shoulder limitations, poor sitting balance and photophobia. She requires +2 total assist for bed mobility and is unable to stand. Recommending up to chair with lift and nursing staff and will defer further OT to SNF.    Follow Up Recommendations  SNF;Supervision/Assistance - 24 hour    Equipment Recommendations  None recommended by OT    Recommendations for Other Services       Precautions / Restrictions Precautions Precautions: Fall Precaution Comments: Morbid Obesity Restrictions RLE Weight Bearing: Weight bearing as tolerated      Mobility Bed Mobility Overal bed mobility: Needs Assistance Bed Mobility: Rolling;Supine to Sit;Sit to Supine Rolling: Total assist;+2 for physical assistance   Supine to sit: Total assist;+2 for physical assistance Sit to supine: Total assist;+2 for physical assistance   General bed mobility comments: Pt reached toward rail but otherwise required total A of 2 for all mobility  Transfers                 General transfer comment: Deferred due to short stature, body habitus, and on bariatric air mattress with feet over a foot away from reaching floor    Balance Overall balance assessment: Needs assistance Sitting-balance support: Bilateral  upper extremity supported Sitting balance-Leahy Scale: Zero Sitting balance - Comments: Sat EOB for 10 minutes with total A.  Again , some limitations due to body habitus/short stature and feet unable to reach floor Postural control: Posterior lean     Standing balance comment: unable                           ADL either performed or assessed with clinical judgement   ADL Overall ADL's : Needs assistance/impaired Eating/Feeding: Set up;Bed level   Grooming: Oral care;Minimal assistance;Sitting                               Functional mobility during ADLs: Total assistance General ADL Comments: pt with baseline dependence in bathing and dressing, assisted pt onto bed pan at end of session and informed NT     Vision Patient Visual Report: No change from baseline;Other (comment) (photophobic)       Perception     Praxis      Pertinent Vitals/Pain Pain Assessment: Faces Faces Pain Scale: Hurts a little bit Pain Location: R femur Pain Descriptors / Indicators: Grimacing Pain Intervention(s): Monitored during session;Repositioned     Hand Dominance Right   Extremity/Trunk Assessment Upper Extremity Assessment Upper Extremity Assessment: Generalized weakness (long standing shoulder limitations)   Lower Extremity Assessment Lower Extremity Assessment: Defer to PT evaluation RLE Deficits / Details: ROM: ankle WFL, hip and knee limited due to pain and body habitus ;  MMT : ankle 3/5, knee 2/5, hip 1/5 LLE Deficits / Details: ROM: WFL but some limitations due to body habitus; MMT : Ankle 5/5, knee 3/5, hip 2/5   Cervical / Trunk Assessment Cervical / Trunk Assessment: Other exceptions Cervical / Trunk Exceptions: Short stature and morbid obesity, weakness   Communication Communication Communication: No difficulties   Cognition Arousal/Alertness: Awake/alert Behavior During Therapy: WFL for tasks assessed/performed Overall Cognitive Status: No  family/caregiver present to determine baseline cognitive functioning                                     General Comments  On 1 LPM O2 with sats 89-91% - assisted with use spirometer    Exercises General Exercises - Lower Extremity Ankle Circles/Pumps: AROM;Both;10 reps Long Arc Quad: AROM;AAROM;Both;5 reps;Seated   Shoulder Instructions      Home Living Family/patient expects to be discharged to:: Skilled nursing facility                                 Additional Comments: Pt from [facility] with plan to return      Prior Functioning/Environment Level of Independence: Needs assistance  Gait / Transfers Assistance Needed: Pt reports she was at SNF and ambulating short distances with assist.  States she could get up/down from lift chair without assist. Used RW. ADL's / Homemaking Assistance Needed: Required assist for bathing, dressing and toileting, could self feed and groom            OT Problem List: Decreased strength;Decreased range of motion;Decreased activity tolerance;Impaired balance (sitting and/or standing);Decreased coordination;Decreased cognition;Obesity;Impaired UE functional use;Pain      OT Treatment/Interventions:      OT Goals(Current goals can be found in the care plan section) Acute Rehab OT Goals Patient Stated Goal: return to [facility]  OT Frequency:     Barriers to D/C:            Co-evaluation PT/OT/SLP Co-Evaluation/Treatment: Yes Reason for Co-Treatment: To address functional/ADL transfers PT goals addressed during session: Mobility/safety with mobility;Balance OT goals addressed during session: ADL's and self-care      AM-PAC OT "6 Clicks" Daily Activity     Outcome Measure Help from another person eating meals?: A Little Help from another person taking care of personal grooming?: A Little Help from another person toileting, which includes using toliet, bedpan, or urinal?: Total Help from another person bathing  (including washing, rinsing, drying)?: Total Help from another person to put on and taking off regular upper body clothing?: A Lot Help from another person to put on and taking off regular lower body clothing?: Total 6 Click Score: 11   End of Session Equipment Utilized During Treatment: Oxygen (1L) Nurse Communication: Other (comment) (NT aware pt is on bed pan)  Activity Tolerance: Patient tolerated treatment well Patient left: in bed;with call bell/phone within reach  OT Visit Diagnosis: Pain;Muscle weakness (generalized) (M62.81)                Time: 3299-2426 OT Time Calculation (min): 42 min Charges:  OT General Charges $OT Visit: 1 Visit OT Evaluation $OT Eval Moderate Complexity: 1 Mod OT Treatments $Self Care/Home Management : 8-22 mins  Nestor Lewandowsky, OTR/L Acute Rehabilitation Services Pager: [phone] Office: [phone]  Malka So 05/10/2020, 11:30 AM

## 2020-05-10 NOTE — [place] FL2 (Signed)
[place] MEDICAID FL2 LEVEL OF CARE SCREENING TOOL     IDENTIFICATION  Patient Name: Melinda Kerr Birthdate: 10-Aug-1935 Sex: female Admission Date (Current Location): 05/08/2020  [facility] and [place] Number:  Herbalist and Address:  [facility], [place] [address], [place], [place] [address]      Provider Number: [id number]  Attending Physician Name and Address:  Lavina Hamman, MD  Relative Name and Phone Number:  Kathrynn Humble - [phone]    Current Level of Care: Hospital Recommended Level of Care: [place] Prior Approval Number:    Date Approved/Denied:   PASRR Number: [id number]  Discharge Plan: SNF    Current Diagnoses: Patient Active Problem List   Diagnosis Date Noted  . Closed fracture of right distal femur ([place]) 05/08/2020  . Acute CVA (cerebrovascular accident) ([place]) 05/15/2019  . TIA (transient ischemic attack) 05/14/2019  . Displaced comminuted fracture of shaft of right femur, initial encounter for closed fracture ([place]) 04/08/2019  . GIB (gastrointestinal bleeding) 11/13/2018  . Anemia associated with acute blood loss 11/13/2018  . Closed fracture of right proximal humerus 10/26/2018  . S/P reverse total shoulder arthroplasty, right 10/26/2018  . Acute appendicitis   . Diverticulitis of large intestine with abscess 07/13/2017  . Colitis 07/13/2017  . Aortic atherosclerosis ([place]) 09/12/2016  . Coronary artery calcification 09/12/2016  . Hiatal hernia 09/12/2016  . Lumbar disc disease 09/12/2016  . Chemotherapy-induced thrombocytopenia 08/04/2016  . Chemotherapy induced neutropenia ([place]) 05/26/2016  . Port catheter in place 05/26/2016  . Encounter for antineoplastic chemotherapy 05/26/2016  . Urinary frequency 04/24/2016  . Obstructive sleep apnea on CPAP 04/24/2016  . History of DVT (deep vein thrombosis) 04/24/2016  . Endometrial cancer, FIGO stage IIIC ([place]) 04/24/2016  . Malignant neoplasm of  endometrium ([place]) 03/26/2016  . Dyspnea on exertion 03/19/2016  . Malignant neoplasm of uterus ([place]) 03/19/2016  . Status post right knee replacement 04/16/2014  . Essential hypertension 04/16/2014  . Postoperative anemia due to acute blood loss 04/12/2014  . Status post total left knee replacement 10/14/2013  . Hypothyroidism 10/14/2013  . UI (urinary incontinence) 10/14/2013  . Dyslipidemia 10/14/2013  . Constipation 10/14/2013  . Gout 10/14/2013  . Anemia 10/14/2013  . Allergic rhinitis 10/14/2013  . Insomnia 10/14/2013  . Depression 10/14/2013  . OA (osteoarthritis) of knee 10/09/2013  . OSA on CPAP 08/10/2013  . Morbid obesity with BMI of 45.0-49.9, adult (HCC)     Orientation RESPIRATION BLADDER Height & Weight     Self, Time, Situation, Place  Normal, Other (Comment), O2 (obstructive sleep apnea - CPAP) Incontinent Weight: 116.6 kg Height:  5\' 3"  (160 cm)  BEHAVIORAL SYMPTOMS/MOOD NEUROLOGICAL BOWEL NUTRITION STATUS      Continent Diet (See Discharge Summary)  AMBULATORY STATUS COMMUNICATION OF NEEDS Skin   Extensive Assist Verbally Surgical wounds, Skin abrasions, Bruising                       Personal Care Assistance Level of Assistance  Bathing, Dressing Bathing Assistance: Maximum assistance   Dressing Assistance: Maximum assistance     Functional Limitations Info  Sight, Hearing, Speech Sight Info: Adequate Hearing Info: Adequate Speech Info: Adequate    SPECIAL CARE FACTORS FREQUENCY  PT (By licensed PT), OT (By licensed OT)     PT Frequency: 5 x per week OT Frequency: 5 x per week            Contractures Contractures Info:  Not present    Additional Factors Info  Code Status, Allergies, Psychotropic Code Status Info: Full code Allergies Info: oxybutynin Psychotropic Info: celexa, vistaril         Current Medications (05/10/2020):  This is the current hospital active medication list Current Facility-Administered Medications   Medication Dose Route Frequency Provider Last Rate Last Admin  . 0.9 %  sodium chloride infusion   Intravenous Continuous Delray Alt, PA-C 100 mL/hr at 05/09/20 2045 New Bag at 05/09/20 2045  . acetaminophen (TYLENOL) tablet 325-650 mg  325-650 mg Oral Q6H PRN Delray Alt, PA-C   650 mg at 05/10/20 0522  . albuterol (PROVENTIL) (2.5 MG/3ML) 0.083% nebulizer solution 3 mL  3 mL Inhalation Q6H PRN Delray Alt, PA-C      . aspirin EC tablet 81 mg  81 mg Oral Daily Patrecia Pace A, PA-C   81 mg at 05/10/20 0947  . calcium carbonate (TUMS - dosed in mg elemental calcium) chewable tablet 200 mg of elemental calcium  1 tablet Oral Daily Lavina Hamman, MD   200 mg of elemental calcium at 05/10/20 0837  . ceFAZolin (ANCEF) IVPB 2g/100 mL premix  2 g Intravenous Q8H Patrecia Pace A, PA-C 200 mL/hr at 05/10/20 0256 2 g at 05/10/20 0256  . citalopram (CELEXA) tablet 10 mg  10 mg Oral Daily Patrecia Pace A, PA-C   10 mg at 05/10/20 0962  . diphenhydrAMINE (BENADRYL) 12.5 MG/5ML elixir 12.5-25 mg  12.5-25 mg Oral Q4H PRN Delray Alt, PA-C      . docusate sodium (COLACE) capsule 100 mg  100 mg Oral BID Patrecia Pace A, PA-C   100 mg at 05/10/20 8366  . enoxaparin (LOVENOX) injection 40 mg  40 mg Subcutaneous Q24H Patrecia Pace A, PA-C   40 mg at 05/10/20 2947  . HYDROcodone-acetaminophen (NORCO/VICODIN) 5-325 MG per tablet 1 tablet  1 tablet Oral Q4H PRN Haddix, Thomasene Lot, MD   1 tablet at 05/10/20 0837  . hydrOXYzine (ATARAX/VISTARIL) tablet 25 mg  25 mg Oral QHS Patrecia Pace A, PA-C   25 mg at 05/09/20 2038  . levothyroxine (SYNTHROID) tablet 125 mcg  125 mcg Oral Q0600 Delray Alt, PA-C   125 mcg at 05/10/20 0524  . morphine 2 MG/ML injection 0.5-1 mg  0.5-1 mg Intravenous Q2H PRN Delray Alt, PA-C   1 mg at 05/09/20 1748  . ondansetron (ZOFRAN) tablet 4 mg  4 mg Oral Q6H PRN Patrecia Pace A, PA-C       Or  . ondansetron [facility]) injection 4 mg  4 mg Intravenous Q6H PRN Delray Alt, PA-C      . pantoprazole (PROTONIX) EC tablet 40 mg  40 mg Oral Daily Patrecia Pace A, PA-C   40 mg at 05/10/20 6546  . polyethylene glycol (MIRALAX / GLYCOLAX) packet 17 g  17 g Oral Daily PRN Delray Alt, PA-C      . pravastatin (PRAVACHOL) tablet 10 mg  10 mg Oral Q Thu Yacobi, Sarah A, PA-C   10 mg at 05/09/20 1557  . zolpidem (AMBIEN) tablet 5 mg  5 mg Oral QHS PRN Delray Alt, PA-C         Discharge Medications: Please see discharge summary for a list of discharge medications.  Relevant Imaging Results:  Relevant Lab Results:   Additional Information SS#: [id number]  Curlene Labrum, RN

## 2020-05-11 DIAGNOSIS — R2681 Unsteadiness on feet: Secondary | ICD-10-CM | POA: Diagnosis not present

## 2020-05-11 DIAGNOSIS — D649 Anemia, unspecified: Secondary | ICD-10-CM | POA: Diagnosis not present

## 2020-05-11 DIAGNOSIS — M2550 Pain in unspecified joint: Secondary | ICD-10-CM | POA: Diagnosis not present

## 2020-05-11 DIAGNOSIS — S72401D Unspecified fracture of lower end of right femur, subsequent encounter for closed fracture with routine healing: Secondary | ICD-10-CM | POA: Diagnosis not present

## 2020-05-11 DIAGNOSIS — M109 Gout, unspecified: Secondary | ICD-10-CM | POA: Diagnosis not present

## 2020-05-11 DIAGNOSIS — R404 Transient alteration of awareness: Secondary | ICD-10-CM | POA: Diagnosis not present

## 2020-05-11 DIAGNOSIS — M25551 Pain in right hip: Secondary | ICD-10-CM | POA: Diagnosis not present

## 2020-05-11 DIAGNOSIS — E785 Hyperlipidemia, unspecified: Secondary | ICD-10-CM | POA: Diagnosis not present

## 2020-05-11 DIAGNOSIS — F410 Panic disorder [episodic paroxysmal anxiety] without agoraphobia: Secondary | ICD-10-CM | POA: Diagnosis not present

## 2020-05-11 DIAGNOSIS — R5381 Other malaise: Secondary | ICD-10-CM | POA: Diagnosis not present

## 2020-05-11 DIAGNOSIS — Z7401 Bed confinement status: Secondary | ICD-10-CM | POA: Diagnosis not present

## 2020-05-11 DIAGNOSIS — S72401A Unspecified fracture of lower end of right femur, initial encounter for closed fracture: Secondary | ICD-10-CM | POA: Diagnosis not present

## 2020-05-11 DIAGNOSIS — I639 Cerebral infarction, unspecified: Secondary | ICD-10-CM | POA: Diagnosis not present

## 2020-05-11 DIAGNOSIS — I959 Hypotension, unspecified: Secondary | ICD-10-CM | POA: Diagnosis not present

## 2020-05-11 DIAGNOSIS — Z8673 Personal history of transient ischemic attack (TIA), and cerebral infarction without residual deficits: Secondary | ICD-10-CM | POA: Diagnosis not present

## 2020-05-11 DIAGNOSIS — G4733 Obstructive sleep apnea (adult) (pediatric): Secondary | ICD-10-CM | POA: Diagnosis not present

## 2020-05-11 DIAGNOSIS — L89610 Pressure ulcer of right heel, unstageable: Secondary | ICD-10-CM | POA: Diagnosis not present

## 2020-05-11 DIAGNOSIS — Z4789 Encounter for other orthopedic aftercare: Secondary | ICD-10-CM | POA: Diagnosis not present

## 2020-05-11 DIAGNOSIS — F329 Major depressive disorder, single episode, unspecified: Secondary | ICD-10-CM | POA: Diagnosis not present

## 2020-05-11 DIAGNOSIS — E039 Hypothyroidism, unspecified: Secondary | ICD-10-CM | POA: Diagnosis not present

## 2020-05-11 LAB — BASIC METABOLIC PANEL
Anion gap: 9 (ref 5–15)
BUN: 12 mg/dL (ref 8–23)
CO2: 26 mmol/L (ref 22–32)
Calcium: 8.5 mg/dL — ABNORMAL LOW (ref 8.9–10.3)
Chloride: 103 mmol/L (ref 98–111)
Creatinine, Ser: 0.79 mg/dL (ref 0.44–1.00)
GFR calc Af Amer: >60 mL/min (ref >60)
GFR calc non Af Amer: >60 mL/min (ref >60)
Glucose, Bld: 129 mg/dL — ABNORMAL HIGH (ref 70–99)
Potassium: 4.5 mmol/L (ref 3.5–5.1)
Sodium: 138 mmol/L (ref 135–145)

## 2020-05-11 LAB — CBC
HCT: 35 % — ABNORMAL LOW (ref 36.0–46.0)
Hemoglobin: 10.9 g/dL — ABNORMAL LOW (ref 12.0–15.0)
MCH: 31.9 pg (ref 26.0–34.0)
MCHC: 31.1 g/dL (ref 30.0–36.0)
MCV: 102.3 fL — ABNORMAL HIGH (ref 80.0–100.0)
Platelets: 261 10*3/uL (ref 150–400)
RBC: 3.42 MIL/uL — ABNORMAL LOW (ref 3.87–5.11)
RDW: 14 % (ref 11.5–15.5)
WBC: 10.6 10*3/uL — ABNORMAL HIGH (ref 4.0–10.5)
nRBC: 0 % (ref 0.0–0.2)

## 2020-05-11 MED ORDER — HYDROCODONE-ACETAMINOPHEN 5-325 MG PO TABS: 1.0000 | ORAL_TABLET | Freq: Four times a day (QID) | ORAL | 0 refills | Status: DC | PRN

## 2020-05-11 MED ORDER — ENOXAPARIN SODIUM 40 MG/0.4ML ~~LOC~~ SOLN: 40.0000 mg | SUBCUTANEOUS | 0 refills | Status: DC

## 2020-05-11 MED ORDER — ZOLPIDEM TARTRATE 10 MG PO TABS: 10.0000 mg | ORAL_TABLET | Freq: Every day | ORAL | 0 refills | Status: DC

## 2020-05-11 MED ORDER — FUROSEMIDE 40 MG PO TABS: 20.0000 mg | ORAL_TABLET | Freq: Every day | ORAL | Status: DC

## 2020-05-11 NOTE — Progress Notes (Signed)
Discharge summary packet with documents provided to [facility] staff. Pt remains alert/oriented in no apparent distress. No complaints voiced.

## 2020-05-11 NOTE — Plan of Care (Signed)
  Problem: Education: Goal: Knowledge of General Education information will improve Description: Including pain rating scale, medication(s)/side effects and non-pharmacologic comfort measures Outcome: Progressing   Problem: Health Behavior/Discharge Planning: Goal: Ability to manage health-related needs will improve Outcome: Progressing   Problem: Activity: Goal: Risk for activity intolerance will decrease Outcome: Progressing   

## 2020-05-11 NOTE — Progress Notes (Signed)
Orthopaedic Trauma Progress Note  S: Having some pain in right leg but controlled. No major issues overnight. Was able to get to edge of bed with therapies yesterday.   O:  Vitals:   05/10/20 1959 05/11/20 0500  BP: (!) 110/56 (!) 105/51  Pulse: 70 73  Resp: 16 15  Temp: 98.3 F (36.8 C) 98.1 F (36.7 C)  SpO2: 97% 98%    GEN: NAD RLE: Dressings removed, incisions C/D/I. Clean dressing applied. Moves toes and foot and endorses sensation. Warm and well perfused foot. Does not tolerate ROM of knee secondary to pain  Imaging: Stable postop imaging  Labs:  Results for orders placed or performed during the hospital encounter of 05/08/20 (from the past 24 hour(s))  Basic metabolic panel     Status: Abnormal   Collection Time: 05/11/20  4:29 AM  Result Value Ref Range   Sodium 138 135 - 145 mmol/L   Potassium 4.5 3.5 - 5.1 mmol/L   Chloride 103 98 - 111 mmol/L   CO2 26 22 - 32 mmol/L   Glucose, Bld 129 (H) 70 - 99 mg/dL   BUN 12 8 - 23 mg/dL   Creatinine, Ser 0.79 0.44 - 1.00 mg/dL   Calcium 8.5 (L) 8.9 - 10.3 mg/dL   GFR calc non Af Amer >60 >60 mL/min   GFR calc Af Amer >60 >60 mL/min   Anion gap 9 5 - 15  CBC     Status: Abnormal   Collection Time: 05/11/20  4:29 AM  Result Value Ref Range   WBC 10.6 (H) 4.0 - 10.5 K/uL   RBC 3.42 (L) 3.87 - 5.11 MIL/uL   Hemoglobin 10.9 (L) 12.0 - 15.0 g/dL   HCT 35.0 (L) 36 - 46 %   MCV 102.3 (H) 80.0 - 100.0 fL   MCH 31.9 26.0 - 34.0 pg   MCHC 31.1 30.0 - 36.0 g/dL   RDW 14.0 11.5 - 15.5 %   Platelets 261 150 - 400 K/uL   nRBC 0.0 0.0 - 0.2 %    Assessment: 85 yo female s/p fall  Injuries: Right periprosthetic distal femur fracture s/p ORIF  Weightbearing: WBAT RLE  Insicional and dressing care: Dressing to be changed tomorrow vs Sunday  Orthopedic device(s):None  CV/Blood loss:Hgb 10.9 this AM, continue to monitor  Pain management: 1. Hydrocodone 5/325mg  q4hrs PRN 2. Morphine 0.5-1 mg q 2hrs PRN 3. Tylenol PRN for mild  pain  VTE prophylaxis: Lovenox 40 mg   ID: Ancef 2 gm post op completed  Foley/Lines: None  Medical co-morbidities: per hospitalists  Impediments to Fracture Healing: Vit D level looks wonderful at 26, no need for supplementation  Dispo: Therapies as tolerated. Patient ok for d/c back to SNF once stable from medical standpoint. Will plan to hold Fosamax for 3 months.  D/c rx for pain med and DVT prophylaxis signed and placed in chart  Follow - up plan: 2 weeks for repeat x-rays  [place] Warzecha A. [facility] [phone] (office) [email or web address]

## 2020-05-11 NOTE — TOC Transition Note (Signed)
Transition of Care [facility]) - CM/SW Discharge Note   Patient Details  Name: Melinda Kerr MRN: [id number] Date of Birth: Apr 09, 1935  [facility] CM/SW Contact:  Gabrielle Dare Phone Number: 05/11/2020, 10:22 AM   Clinical Narrative:    Patient will Discharge To: [facility] Anticipated DC Date:05/11/20 Family Notified:yes, niece, Kathrynn Humble [phone] Transport By: Corey Harold    Per MD patient ready for DC to [facility] . RN, patient, patient's family, and facility notified of DC. Assessment, Fl2/[place], and Discharge Summary sent to facility. RN given number for report [phone], Room # 608). DC packet on chart. Ambulance transport requested for patient for 1:00pm  CSW signing off.  Reed Breech LCSWA [phone]     Final next level of care: Skilled Nursing Facility Barriers to Discharge: No Barriers Identified   Patient Goals and CMS Choice Patient states their goals for this hospitalization and ongoing recovery are:: Patient plans to transfer back to [facility] where she has lived for the past year. CMS Medicare.gov Compare Post Acute Care list provided to:: Patient Choice offered to / list presented to : Patient  Discharge Placement              Patient chooses bed at: [facility] Patient to be transferred to facility by: [facility] Name of family member notified: Kathrynn Humble Patient and family notified of of transfer: 05/11/20  Discharge Plan and Services   Discharge Planning Services: CM Consult Post Acute Care Choice: [place]                               Social Determinants of Health ([place]) Interventions     Readmission Risk Interventions Readmission Risk Prevention Plan 05/10/2020  Transportation Screening Complete  PCP or Specialist Appt within 5-7 Days Complete  Home Care Screening Complete  Medication Review (RN CM) Complete  Some recent data might be hidden

## 2020-05-11 NOTE — Progress Notes (Signed)
Report given to Hilda Blades, staff nurse at [facility]. All questions and concerns were answered.

## 2020-05-11 NOTE — Plan of Care (Signed)
  Problem: Education: Goal: Knowledge of General Education information will improve Description: Including pain rating scale, medication(s)/side effects and non-pharmacologic comfort measures Outcome: Progressing   Problem: Clinical Measurements: Goal: Ability to maintain clinical measurements within normal limits will improve Outcome: Progressing   Problem: Clinical Measurements: Goal: Will remain free from infection Outcome: Progressing   Problem: Activity: Goal: Risk for activity intolerance will decrease Outcome: Progressing   Problem: Elimination: Goal: Will not experience complications related to bowel motility Outcome: Progressing   Problem: Pain Managment: Goal: General experience of comfort will improve Outcome: Progressing   Problem: Safety: Goal: Ability to remain free from injury will improve Outcome: Progressing   Problem: Skin Integrity: Goal: Risk for impaired skin integrity will decrease Outcome: Progressing

## 2020-05-11 NOTE — Discharge Summary (Signed)
[facility] Discharge Summary   Patient: Melinda Kerr ZHG:[id number]  PCP: Maury Dus, MD  Date of admission: 05/08/2020   Date of discharge:  05/11/2020     Discharge Diagnoses:  Principal Problem:   Closed fracture of right distal femur ([place]) Active Problems:   OA (osteoarthritis) of knee   Status post total left knee replacement   Hypothyroidism   Essential hypertension   History of DVT (deep vein thrombosis)   Admitted From: SNF Disposition:  SNF   Recommendations for Outpatient Follow-up:  1. PCP: please follow up in 1 week consider increasing lasix dose back to 40 mg daily in 2-3 days if needed 2. Follow up LABS/TEST:  Cbc and bmp in 1 week   Follow-up Information    Haddix, Thomasene Lot, MD. Schedule an appointment as soon as possible for a visit in 2 weeks.   Specialty: Orthopedic Surgery Why: For wound re-check, for repeat x-rays Contact information: [place] [address] [phone]        Maury Dus, MD. Schedule an appointment as soon as possible for a visit in 1 week(s).   Specialty: Family Medicine Why: repeat CBC in 1 week. re-evaluate for the lasix dose.  Contact information: [place] [address] [phone]              Diet recommendation: Cardiac diet  Activity: The patient is advised to gradually reintroduce usual activities, as tolerated  Discharge Condition: stable  Code Status: Full code   History of present illness: As per the H and P dictated on admission, " Melinda Kerr is a 85 y.o. female with medical history significant of CHF, previous DVT, previous humeral fracture, total knee replacement bilaterally, diabetes, diverticular disease, hypertension, hypothyroidism, morbid obesity, obstructive sleep apnea, hyperlipidemia, GERD, uterine cancer among other things who has had previous right total knee replacement as well as femur fracture with a rod in place after fixation.   Patient sustained another fall at the nursing facility and was brought with pain in her right lower leg.  X-rays and CTs showed right distal femur fracture involving the previous rod insertion as well as distal diaphysis.  The fall occurred about a week ago when she was walking with a walker.  Her legs gave out on her and she fell to her bottom.  She has been having severe pain in the right hip the right thigh and the right knee.  Initially was made nonweightbearing with x-rays initially showing no fractures.  Later that day it showed age-indeterminate displaced fracture.  Patient was taking Norco but the pain is unbearable now.  Range of motion has decreased and she was sent over here for further treatment.  Orthopedic already aware and intent to have surgical intervention on the patient tomorrow."  Hospital Course:  Summary of her active problems in the hospital is as following. 1 closed distal femoral fracture: Periprostatic. Patient has had previous surgery.  Appreciate orthopedic assistance. Patient underwent ORIF and removal of the hardware. Pain control, weightbearing status per orthopedics. "Weightbearing: WBAT RLE Insicional and dressing care: Dressing to be changed tomorrow vs Sunday Orthopedic device(s):None Vit D level looks wonderful at 66, no need for supplementation. plan to hold Fosamax for 3 months." Continue bowel regimen.  2 osteoarthritis: continue with regimen as per home. plan to hold Fosamax for 3 months.  3 hypertension: BP soft Lasix dose reduced in half.  Continue blood pressure control using home regimen.  4  hypothyroidism: Continue to replace her thyroid.  5 GERD: Continue with PPIs.  6 chronic HFpEF Patient on Lasix. Compensated at the moment. Dose reduced in half Resume home dose when appropriate  7 obstructive sleep apnea:  8 morbid obesity: Dietary counseling Body mass index is 45.53 kg/m.  Pain control  - [employer]  Controlled Substance Reporting System database was reviewed. - 5 day supply was provided. - Patient was instructed, not to drive, operate heavy machinery, perform activities at heights, swimming or participation in water activities or provide baby sitting services while on Pain, Sleep and Anxiety Medications; until her outpatient Physician has advised to do so again.  - Also recommended to not to take more than prescribed Pain, Sleep and Anxiety Medications.  Patient was seen by physical therapy, who recommended SNF, which was arranged. On the day of the discharge the patient's vitals were stable, and no other acute medical condition were reported by patient. the patient was felt safe to be discharge at SNF with therapy.  Consultants: Orthopedics  Procedures: 1. CPT 27511-Open reduction internal fixation of right distal femur fracture 2. CPT 20680-Removal of hardware right femur  Discharge Exam: General: Appear in mild distress, no Rash; Oral Mucosa Clear, moist. Cardiovascular: S1 and S2 Present, no Murmur, Respiratory: normal respiratory effort, Bilateral Air entry present and faint basal Crackles, no wheezes Abdomen: Bowel Sound present, Soft and no tenderness, no hernia Extremities: no Pedal edema, no calf tenderness Neurology: alert and oriented to time, place, and person affect appropriate.  Filed Weights   05/09/20 1430  Weight: 116.6 kg   Vitals:   05/10/20 1959 05/11/20 0500  BP: (!) 110/56 (!) 105/51  Pulse: 70 73  Resp: 16 15  Temp: 98.3 F (36.8 C) 98.1 F (36.7 C)  SpO2: 97% 98%    DISCHARGE MEDICATION: Allergies as of 05/11/2020      Reactions   Oxybutynin Other (See Comments)   Bleeding gums, felt sick      Medication List    STOP taking these medications   alendronate 70 MG tablet Commonly known as: FOSAMAX     TAKE these medications   acetaminophen 325 MG tablet Commonly known as: TYLENOL Take 2 tablets (650 mg total) by mouth every 6 (six) hours  as needed for mild pain or moderate pain (pain score 1-3 or temp > 100.5).   albuterol 108 (90 Base) MCG/ACT inhaler Commonly known as: VENTOLIN HFA Inhale 2 puffs into the lungs every 6 (six) hours as needed for wheezing or shortness of breath.   allopurinol 300 MG tablet Commonly known as: ZYLOPRIM Take 300 mg by mouth daily.   ARTIFICIAL TEAR OP Place 1 drop into both eyes in the morning, at noon, in the evening, and at bedtime.   aspirin EC 81 MG tablet Take 81 mg by mouth daily. Swallow whole.   baclofen 10 MG tablet Commonly known as: LIORESAL Take 1 tablet (10 mg total) by mouth 3 (three) times daily as needed for muscle spasms. What changed: when to take this   calcium carbonate 500 MG chewable tablet Commonly known as: TUMS - dosed in mg elemental calcium Chew 1 tablet by mouth daily.   cetirizine 10 MG tablet Commonly known as: ZYRTEC Take 10 mg by mouth daily.   citalopram 10 MG tablet Commonly known as: CELEXA Take 10 mg by mouth daily.   docusate sodium 100 MG capsule Commonly known as: COLACE Take 1 capsule (100 mg total) by mouth 2 (two) times daily.  enoxaparin 40 MG/0.4ML injection Commonly known as: LOVENOX Inject 0.4 mLs (40 mg total) into the skin daily.   Fish Oil 1000 MG Caps Take 1,000 mg by mouth daily.   furosemide 40 MG tablet Commonly known as: LASIX Take 0.5 tablets (20 mg total) by mouth daily. What changed: how much to take   HYDROcodone-acetaminophen 5-325 MG tablet Commonly known as: NORCO/VICODIN Take 1 tablet by mouth every 6 (six) hours as needed for severe pain. What changed:   how much to take  reasons to take this   hydrOXYzine 25 MG tablet Commonly known as: ATARAX/VISTARIL Take 25 mg by mouth at bedtime.   iron polysaccharides 150 MG capsule Commonly known as: NIFEREX Take 150 mg by mouth daily.   levothyroxine 125 MCG tablet Commonly known as: SYNTHROID Take 125 mcg by mouth daily at 6 (six) AM.     metoCLOPramide 5 MG tablet Commonly known as: REGLAN Take 5 mg by mouth 4 (four) times daily.   nystatin powder Generic drug: nystatin Apply 1 Bottle topically See admin instructions. Apply topically to abdominal skin folds and under bilateral breast for intertrigo - twice daily - please make sure skin has been cleaned and is dry before applying   OXYGEN Inhale 2 L into the lungs continuous. To maintain O2 sat greater then 90% every shift   pantoprazole 40 MG tablet Commonly known as: PROTONIX Take 1 tablet (40 mg total) by mouth 2 (two) times daily before a meal.   polyethylene glycol 17 g packet Commonly known as: MIRALAX / GLYCOLAX Take 17 g by mouth daily as needed for mild constipation or moderate constipation.   pravastatin 10 MG tablet Commonly known as: PRAVACHOL Take 10 mg by mouth every Thursday. At bedtime   pyridOXINE 100 MG tablet Commonly known as: VITAMIN B-6 Take 100 mg by mouth daily.   saccharomyces boulardii 250 MG capsule Commonly known as: FLORASTOR Take 250 mg by mouth 2 (two) times daily.   senna-docusate 8.6-50 MG tablet Commonly known as: Senokot-S Take 1 tablet by mouth daily.   spironolactone 25 MG tablet Commonly known as: ALDACTONE Take 25 mg by mouth daily.   tolterodine 4 MG 24 hr capsule Commonly known as: DETROL LA Take 4 mg by mouth every evening.   traMADol 50 MG tablet Commonly known as: ULTRAM Take 50 mg by mouth every 8 (eight) hours as needed for moderate pain.   vitamin B-12 1000 MCG tablet Commonly known as: CYANOCOBALAMIN Take 1,000 mcg by mouth daily.   vitamin C 500 MG tablet Commonly known as: ASCORBIC ACID Take 500 mg by mouth daily.   Vitamin D (Ergocalciferol) 1.25 MG (50000 UNIT) Caps capsule Commonly known as: DRISDOL Take 50,000 Units by mouth every 7 (seven) days. Monday   zolpidem 10 MG tablet Commonly known as: AMBIEN Take 1 tablet (10 mg total) by mouth at bedtime.            Discharge Care  Instructions  (From admission, onward)         Start     Ordered   05/11/20 0000  Leave dressing on - Keep it clean, dry, and intact until clinic visit        05/11/20 0842         Allergies  Allergen Reactions  . Oxybutynin Other (See Comments)    Bleeding gums, felt sick   Discharge Instructions    Diet - low sodium heart healthy   Complete by: As directed  Increase activity slowly   Complete by: As directed    Leave dressing on - Keep it clean, dry, and intact until clinic visit   Complete by: As directed       The results of significant diagnostics from this hospitalization (including imaging, microbiology, ancillary and laboratory) are listed below for reference.    Significant Diagnostic Studies: DG Tibia/Fibula Right  Result Date: 05/08/2020 CLINICAL DATA:  Fall, right leg pain EXAM: RIGHT TIBIA AND FIBULA - 2 VIEW COMPARISON:  None. FINDINGS: Changes of right knee replacement. No acute bony abnormality. Specifically, no fracture, subluxation, or dislocation. IMPRESSION: No acute bony abnormality. Electronically Signed   By: Rolm Baptise M.D.   On: 05/08/2020 18:36   CT Knee Right Wo Contrast  Result Date: 05/08/2020 CLINICAL DATA:  Fall 1 week ago.  Knee trauma. EXAM: CT OF THE RIGHT KNEE WITHOUT CONTRAST TECHNIQUE: Multidetector CT imaging of the right knee was performed according to the standard protocol. Multiplanar CT image reconstructions were also generated. COMPARISON:  CT right hip same date. Prior radiographs 04/09/2019 and 04/08/2019. FINDINGS: Bones/Joint/Cartilage This examination extends from the distal thigh into the proximal lower leg. The examination of the right hip extends into the proximal thigh. There are portions of the mid thigh and mid femur which are not included in either CT exam. Therefore, further evaluation with right femur radiographs recommended. Patient is status post right femoral intramedullary nail fixation with a distal interlocking  screw. Previous right total knee arthroplasty has also been performed. There is a comminuted and moderately displaced fracture of the distal femoral diaphysis, demonstrating up to 13 mm of medial displacement proximally. There is distal extension of the fracture into the lateral metaphyseal region adjacent to the femoral component of the total knee arthroplasty. No evidence of hardware loosening or dislocation. The proximal tibia and proximal fibula are intact. No large joint effusion identified, although evaluation of the soft tissues is limited by artifact from the surgical hardware. Ligaments Previous total knee arthroplasty. Muscles and Tendons Generalized muscular atrophy.  No evidence of acute tendon injury. Soft tissues Mild soft tissue swelling in the distal thigh without focal hematoma. Soft tissue evaluation limited by artifact from the surgical hardware. IMPRESSION: 1. Comminuted and moderately displaced fracture of the distal femoral diaphysis status post right total knee arthroplasty and femoral intramedullary nail fixation. No evidence of hardware loosening or dislocation. 2. The proximal tibia and proximal fibula are intact. 3. There are portions of the mid right thigh and femur which are not included on this examination or the examination of the right hip. Plain film correlation recommended. Electronically Signed   By: Richardean Sale M.D.   On: 05/08/2020 17:20   CT Hip Right Wo Contrast  Result Date: 05/08/2020 CLINICAL DATA:  Fall 1 week ago.  Right hip pain. EXAM: CT OF THE RIGHT HIP WITHOUT CONTRAST TECHNIQUE: Multidetector CT imaging of the right hip was performed according to the standard protocol. Multiplanar CT image reconstructions were also generated. COMPARISON:  Radiographs 04/09/2019.  Pelvic CT 10/21/2017. FINDINGS: Bones/Joint/Cartilage Status post ORIF of a comminuted intertrochanteric right femur fracture with a dynamic femoral neck screw and long intramedullary nail. The distal  end of the intramedullary nail is not included on this CT of the hip. No evidence of hardware loosening or failure. There is posttraumatic deformity of the proximal femur consistent with an old healed intertrochanteric fracture. There is associated heterotopic ossification. Residual fracture line is visible anteriorly on the coronal images, but there is  solid healing posteriorly. No evidence of acute fracture, dislocation or femoral head avascular necrosis. Ligaments Suboptimally assessed by CT. Muscles and Tendons Soft tissue evaluation limited by artifact from the femoral hardware. There is gluteus muscular atrophy. Soft tissues Soft tissue evaluation limited by artifact from the femoral hardware. No large periarticular hematoma identified. IMPRESSION: 1. No evidence of acute right hip fracture, dislocation or femoral head avascular necrosis. 2. Old healed intertrochanteric fracture of the proximal femur status post ORIF. No evidence of hardware loosening. 3. The distal end of the femoral intramedullary nail is not included on this study. See separate examination of the right knee. Radiographic correlation should be considered. Electronically Signed   By: Richardean Sale M.D.   On: 05/08/2020 17:11   DG C-Arm 1-60 Min  Result Date: 05/09/2020 CLINICAL DATA:  Open reduction internal fixation distal femur fracture. EXAM: RIGHT FEMUR 2 VIEWS; DG C-ARM 1-60 MIN COMPARISON:  Plain film of the RIGHT femur dated 05/08/2020 FINDINGS: Six intraoperative fluoroscopic images of the RIGHT femur are provided. Plate and screw fixation hardware appears appropriately positioned, traversing the distal femur fracture site. Intramedullary rod remains in place. RIGHT knee arthroplasty hardware is partially imaged and appears appropriately positioned. IMPRESSION: Intraoperative fluoroscopic images demonstrating appropriate positioning of the plate and screw fixation hardware which traverses the distal femur fracture site. No  evidence of surgical complicating feature. Electronically Signed   By: Franki Cabot M.D.   On: 05/09/2020 14:39   DG FEMUR, MIN 2 VIEWS RIGHT  Result Date: 05/09/2020 CLINICAL DATA:  Open reduction internal fixation distal femur fracture. EXAM: RIGHT FEMUR 2 VIEWS; DG C-ARM 1-60 MIN COMPARISON:  Plain film of the RIGHT femur dated 05/08/2020 FINDINGS: Six intraoperative fluoroscopic images of the RIGHT femur are provided. Plate and screw fixation hardware appears appropriately positioned, traversing the distal femur fracture site. Intramedullary rod remains in place. RIGHT knee arthroplasty hardware is partially imaged and appears appropriately positioned. IMPRESSION: Intraoperative fluoroscopic images demonstrating appropriate positioning of the plate and screw fixation hardware which traverses the distal femur fracture site. No evidence of surgical complicating feature. Electronically Signed   By: Franki Cabot M.D.   On: 05/09/2020 14:39   DG Femur Min 2 Views Right  Result Date: 05/08/2020 CLINICAL DATA:  Fall, right leg pain EXAM: RIGHT FEMUR 2 VIEWS COMPARISON:  04/08/2019 FINDINGS: There is an acute, oblique fracture of the distal diaphysis of the right femur with approximately 1 cm posteromedial displacement of the distal fracture fragment. Right hip ORIF has been performed utilizing a a gamma nail with a long intramedullary femoral rod and distal interlocking screw. Right total knee arthroplasty has been performed. Fracture plane appears to extend through the level of the distal interlocking screw of the intramedullary rod to the level of the anterior aspect of the a femoral component of right total knee arthroplasty. Healed right femoral neck fracture. Femoral head is still seated within the right acetabulum. Mild to moderate degenerative right hip arthritis is noted. IMPRESSION: 1. Acute, oblique fracture of the distal diaphysis of the right femur with approximately 1 cm posteromedial  displacement of the distal fracture fragment. 2. Prior right hip ORIF. 3. Right total knee arthroplasty. Electronically Signed   By: Fidela Salisbury MD   On: 05/08/2020 18:42   DG FEMUR PORT, MIN 2 VIEWS RIGHT  Result Date: 05/09/2020 CLINICAL DATA:  Periprostatic distal femur fracture status post fixation. EXAM: RIGHT FEMUR PORTABLE 2 VIEW COMPARISON:  Plain film of the RIGHT femur dated 05/08/2020. FINDINGS: Plate  and screw fixation hardware appears intact and appropriately positioned, traversing the distal femur fracture site. Intramedullary rod appears intact and appropriately positioned. Distal femur fracture appears stable in alignment. IMPRESSION: Status post plate and screw fixation of the distal femur fracture. No evidence of surgical complicating feature. Electronically Signed   By: Franki Cabot M.D.   On: 05/09/2020 16:43    Microbiology: Recent Results (from the past 240 hour(s))  SARS Coronavirus 2 by RT PCR (hospital order, performed in [facility] hospital lab) Nasopharyngeal Nasopharyngeal Swab     Status: None   Collection Time: 05/08/20  6:34 PM   Specimen: Nasopharyngeal Swab  Result Value Ref Range Status   SARS Coronavirus 2 NEGATIVE NEGATIVE Final    Comment: (NOTE) SARS-CoV-2 target nucleic acids are NOT DETECTED.  The SARS-CoV-2 RNA is generally detectable in upper and lower respiratory specimens during the acute phase of infection. The lowest concentration of SARS-CoV-2 viral copies this assay can detect is 250 copies / mL. A negative result does not preclude SARS-CoV-2 infection and should not be used as the sole basis for treatment or other patient management decisions.  A negative result may occur with improper specimen collection / handling, submission of specimen other than nasopharyngeal swab, presence of viral mutation(s) within the areas targeted by this assay, and inadequate number of viral copies (<250 copies / mL). A negative result must be combined with  clinical observations, patient history, and epidemiological information.  Fact Sheet for Patients:   StrictlyIdeas.no  Fact Sheet for Healthcare Providers: BankingDealers.co.za  This test is not yet approved or  cleared by the [place] FDA and has been authorized for detection and/or diagnosis of SARS-CoV-2 by FDA under an Emergency Use Authorization (EUA).  This EUA will remain in effect (meaning this test can be used) for the duration of the COVID-19 declaration under Section 564(b)(1) of the Act, 21 U.S.C. section 360bbb-3(b)(1), unless the authorization is terminated or revoked sooner.  Performed at [facility], [place] [address] [place], [place] [address]   Surgical pcr screen     Status: None   Collection Time: 05/09/20  5:04 AM   Specimen: Nasal Mucosa; Nasal Swab  Result Value Ref Range Status   MRSA, PCR NEGATIVE NEGATIVE Final   Staphylococcus aureus NEGATIVE NEGATIVE Final    Comment: (NOTE) The Xpert SA Assay (FDA approved for NASAL specimens in patients 52 years of age and older), is one component of a comprehensive surveillance program. It is not intended to diagnose infection nor to guide or monitor treatment. Performed at [facility], [place] [address], [place],  [address]      Labs: CBC: Recent Labs  Lab 05/08/20 1644 05/09/20 0321 05/10/20 0341 05/11/20 0429  WBC 9.4 10.6* 10.1 10.6*  NEUTROABS 7.4  --   --   --   HGB 12.8 12.7 11.4* 10.9*  HCT 41.5 39.8 36.1 35.0*  MCV 99.0 97.8 100.8* 102.3*  PLT 281 322 334 798   Basic Metabolic Panel: Recent Labs  Lab 05/08/20 1644 05/09/20 0321 05/10/20 0341 05/11/20 0429  NA 139 140 141 138  K 4.1 3.9 4.0 4.5  CL 96* 98 99 103  CO2 30 31 32 26  GLUCOSE 144* 151* 147* 129*  BUN 15 15 14 12   CREATININE 0.81 0.90 0.98 0.79  CALCIUM 9.2 9.5 9.0 8.5*   Liver Function Tests: Recent Labs  Lab 05/09/20 0321  AST 56*  ALT 54*    ALKPHOS 155*  BILITOT 2.0*  PROT 5.9*  ALBUMIN 2.9*   No results for input(s): LIPASE, AMYLASE in the last 168 hours. No results for input(s): AMMONIA in the last 168 hours. Cardiac Enzymes: No results for input(s): CKTOTAL, CKMB, CKMBINDEX, TROPONINI in the last 168 hours. BNP (last 3 results) Recent Labs    05/14/19 1130  BNP 185.7*   CBG: Recent Labs  Lab 05/09/20 1309  GLUCAP 149*    Time spent: 35 minutes  Signed:  Berle Mull  [facility]  05/11/2020 10:30 AM

## 2020-05-11 NOTE — Discharge Instructions (Signed)
[facility] Discharge Instructions   General Discharge Instructions  WEIGHT BEARING STATUS: Weightbearing as tolerated right lower extremity  RANGE OF MOTION/ACTIVITY: Okay for knee motion as tolerated. Work on aggressive ankle motion to be able to obtain neutral dorsiflexion  Wound Care: Incisions can be left open to air if there is no drainage. If incision continues to have drainage, follow wound care instructions below. Okay to shower if no drainage from incisions.  DVT/PE prophylaxis: Lovenox x 30 days  Diet: as you were eating previously.  Can use over the counter stool softeners and bowel preparations, such as Miralax, to help with bowel movements.  Narcotics can be constipating.  Be sure to drink plenty of fluids  PAIN MEDICATION USE AND EXPECTATIONS  You have likely been given narcotic medications to help control your pain.  After a traumatic event that results in an fracture (broken bone) with or without surgery, it is ok to use narcotic pain medications to help control one's pain.  We understand that everyone responds to pain differently and each individual patient will be evaluated on a regular basis for the continued need for narcotic medications. Ideally, narcotic medication use should last no more than 6-8 weeks (coinciding with fracture healing).   As a patient it is your responsibility as well to monitor narcotic medication use and report the amount and frequency you use these medications when you come to your office visit.   We would also advise that if you are using narcotic medications, you should take a dose prior to therapy to maximize you participation.  IF YOU ARE ON NARCOTIC MEDICATIONS IT IS NOT PERMISSIBLE TO OPERATE A MOTOR VEHICLE (MOTORCYCLE/CAR/TRUCK/MOPED) OR HEAVY MACHINERY DO NOT MIX NARCOTICS WITH OTHER CNS (CENTRAL NERVOUS SYSTEM) DEPRESSANTS SUCH AS ALCOHOL   STOP SMOKING OR USING NICOTINE PRODUCTS!!!!  As discussed nicotine severely  impairs your body's ability to heal surgical and traumatic wounds but also impairs bone healing.  Wounds and bone heal by forming microscopic blood vessels (angiogenesis) and nicotine is a vasoconstrictor (essentially, shrinks blood vessels).  Therefore, if vasoconstriction occurs to these microscopic blood vessels they essentially disappear and are unable to deliver necessary nutrients to the healing tissue.  This is one modifiable factor that you can do to dramatically increase your chances of healing your injury.    (This means no smoking, no nicotine gum, patches, etc)  DO NOT USE NONSTEROIDAL ANTI-INFLAMMATORY DRUGS (NSAID'S)  Using products such as Advil (ibuprofen), Aleve (naproxen), Motrin (ibuprofen) for additional pain control during fracture healing can delay and/or prevent the healing response.  If you would like to take over the counter (OTC) medication, Tylenol (acetaminophen) is ok.  However, some narcotic medications that are given for pain control contain acetaminophen as well. Therefore, you should not exceed more than 4000 mg of tylenol in a day if you do not have liver disease.  Also note that there are may OTC medicines, such as cold medicines and allergy medicines that my contain tylenol as well.  If you have any questions about medications and/or interactions please ask your doctor/PA or your pharmacist.      ICE AND ELEVATE INJURED/OPERATIVE EXTREMITY  Using ice and elevating the injured extremity above your heart can help with swelling and pain control.  Icing in a pulsatile fashion, such as 20 minutes on and 20 minutes off, can be followed.    Do not place ice directly on skin. Make sure there is a barrier between to skin and the  ice pack.    Using frozen items such as frozen peas works well as the conform nicely to the are that needs to be iced.  USE AN ACE WRAP OR TED HOSE FOR SWELLING CONTROL  In addition to icing and elevation, Ace wraps or TED hose are used to help limit  and resolve swelling.  It is recommended to use Ace wraps or TED hose until you are informed to stop.    When using Ace Wraps start the wrapping distally (farthest away from the body) and wrap proximally (closer to the body)   Example: If you had surgery on your leg or thing and you do not have a splint on, start the ace wrap at the toes and work your way up to the thigh        If you had surgery on your upper extremity and do not have a splint on, start the ace wrap at your fingers and work your way up to the upper arm  IF YOU ARE IN A SPLINT OR CAST DO NOT [place]   If your splint gets wet for any reason please contact the office immediately. You may shower in your splint or cast as long as you keep it dry.  This can be done by wrapping in a cast cover or garbage back (or similar)  Do Not stick any thing down your splint or cast such as pencils, money, or hangers to try and scratch yourself with.  If you feel itchy take benadryl as prescribed on the bottle for itching  IF YOU ARE IN A CAM BOOT (BLACK BOOT)  You may remove boot periodically. Perform daily dressing changes as noted below.  Wash the liner of the boot regularly and wear a sock when wearing the boot. It is recommended that you sleep in the boot until told otherwise   CALL THE OFFICE WITH ANY QUESTIONS OR CONCERNS: [phone]   VISIT OUR WEBSITE FOR ADDITIONAL INFORMATION: [email or web address]     Discharge Wound Care Instructions  Do NOT apply any ointments, solutions or lotions to pin sites or surgical wounds.  These prevent needed drainage and even though solutions like hydrogen peroxide kill bacteria, they also damage cells lining the pin sites that help fight infection.  Applying lotions or ointments can keep the wounds moist and can cause them to breakdown and open up as well. This can increase the risk for infection. When in doubt call the office.  Surgical incisions should be dressed daily.  If any  drainage is noted, use one layer of adaptic, then gauze, Kerlix, and an ace wrap.  Once the incision is completely dry and without drainage, it may be left open to air out.  Showering may begin 36-48 hours later.  Cleaning gently with soap and water.  Traumatic wounds should be dressed daily as well.    One layer of adaptic, gauze, Kerlix, then ace wrap.  The adaptic can be discontinued once the draining has ceased    If you have a wet to dry dressing: wet the gauze with saline the squeeze as much saline out so the gauze is moist (not soaking wet), place moistened gauze over wound, then place a dry gauze over the moist one, followed by Kerlix wrap, then ace wrap.

## 2020-05-14 ENCOUNTER — Other Ambulatory Visit: Payer: Self-pay | Admitting: *Deleted

## 2020-05-14 NOTE — Patient Outreach (Signed)
Member screened for potential [facility] Care Management needs as a benefit of [place] Medicare.  Made aware by [facility] [facility] hospital liaison that member returned to [facility]. She was a long term resident prior.   Communication sent to facility SW to make aware writer is following for transition plans.    Marthenia Rolling, MSN-Ed, RN,BSN [place] Acute Care Coordinator [phone] [facility]) [phone]  (Toll free office)

## 2020-05-15 DIAGNOSIS — L89610 Pressure ulcer of right heel, unstageable: Secondary | ICD-10-CM | POA: Diagnosis not present

## 2020-05-16 ENCOUNTER — Other Ambulatory Visit: Payer: Self-pay | Admitting: *Deleted

## 2020-05-16 NOTE — Patient Outreach (Signed)
[facility] Post- Acute Care Coordinator follow up. Member screened for potential [facility] Care Management needs as a benefit of [place] Medicare.  Update received from [facility] SW indicating Melinda Kerr's transition plan is to return to long tem care at [facility].   No identifiable [facility] Care Management needs at this time.   Melinda Rolling, MSN-Ed, RN,BSN [place] Acute Care Coordinator [phone] [facility]) [phone]  (Toll free office)

## 2020-05-21 ENCOUNTER — Other Ambulatory Visit: Payer: Self-pay | Admitting: *Deleted

## 2020-05-21 NOTE — Patient Outreach (Signed)
[facility] Post- Acute Care Coordinator follow up. Member screened for potential [facility] Care Management needs as a benefit of [place] Medicare.  Per Patient Melinda Kerr member remains in [facility].   Communication sent to [facility] SW to request update on transtion plans.   Will continue to follow while member receiving skilled care.   Marthenia Rolling, MSN-Ed, RN,BSN [place] Acute Care Coordinator [phone] [facility]) [phone]  (Toll free office)

## 2020-05-22 DIAGNOSIS — L89610 Pressure ulcer of right heel, unstageable: Secondary | ICD-10-CM | POA: Diagnosis not present

## 2020-05-27 DIAGNOSIS — M6281 Muscle weakness (generalized): Secondary | ICD-10-CM | POA: Diagnosis not present

## 2020-05-27 DIAGNOSIS — R2681 Unsteadiness on feet: Secondary | ICD-10-CM | POA: Diagnosis not present

## 2020-05-27 DIAGNOSIS — S72401D Unspecified fracture of lower end of right femur, subsequent encounter for closed fracture with routine healing: Secondary | ICD-10-CM | POA: Diagnosis not present

## 2020-05-27 DIAGNOSIS — Z4789 Encounter for other orthopedic aftercare: Secondary | ICD-10-CM | POA: Diagnosis not present

## 2020-05-27 DIAGNOSIS — I5020 Unspecified systolic (congestive) heart failure: Secondary | ICD-10-CM | POA: Diagnosis not present

## 2020-05-28 ENCOUNTER — Other Ambulatory Visit: Payer: Self-pay | Admitting: *Deleted

## 2020-05-28 DIAGNOSIS — S72401D Unspecified fracture of lower end of right femur, subsequent encounter for closed fracture with routine healing: Secondary | ICD-10-CM | POA: Diagnosis not present

## 2020-05-28 DIAGNOSIS — Z4789 Encounter for other orthopedic aftercare: Secondary | ICD-10-CM | POA: Diagnosis not present

## 2020-05-28 DIAGNOSIS — I5020 Unspecified systolic (congestive) heart failure: Secondary | ICD-10-CM | POA: Diagnosis not present

## 2020-05-28 DIAGNOSIS — R2681 Unsteadiness on feet: Secondary | ICD-10-CM | POA: Diagnosis not present

## 2020-05-28 DIAGNOSIS — M6281 Muscle weakness (generalized): Secondary | ICD-10-CM | POA: Diagnosis not present

## 2020-05-28 NOTE — Patient Outreach (Signed)
[facility] Post- Acute Care Coordinator follow up.   [facility] previously reported member will return to LTC at [facility].   Marthenia Rolling, MSN-Ed, RN,BSN [place] Acute Care Coordinator [phone] [facility]) [phone]  (Toll free office)

## 2020-05-29 DIAGNOSIS — L89610 Pressure ulcer of right heel, unstageable: Secondary | ICD-10-CM | POA: Diagnosis not present

## 2020-05-30 DIAGNOSIS — M6281 Muscle weakness (generalized): Secondary | ICD-10-CM | POA: Diagnosis not present

## 2020-05-30 DIAGNOSIS — I5020 Unspecified systolic (congestive) heart failure: Secondary | ICD-10-CM | POA: Diagnosis not present

## 2020-05-30 DIAGNOSIS — Z4789 Encounter for other orthopedic aftercare: Secondary | ICD-10-CM | POA: Diagnosis not present

## 2020-05-30 DIAGNOSIS — R2681 Unsteadiness on feet: Secondary | ICD-10-CM | POA: Diagnosis not present

## 2020-05-30 DIAGNOSIS — S72401D Unspecified fracture of lower end of right femur, subsequent encounter for closed fracture with routine healing: Secondary | ICD-10-CM | POA: Diagnosis not present

## 2020-06-04 DIAGNOSIS — I5020 Unspecified systolic (congestive) heart failure: Secondary | ICD-10-CM | POA: Diagnosis not present

## 2020-06-04 DIAGNOSIS — R2681 Unsteadiness on feet: Secondary | ICD-10-CM | POA: Diagnosis not present

## 2020-06-04 DIAGNOSIS — M6281 Muscle weakness (generalized): Secondary | ICD-10-CM | POA: Diagnosis not present

## 2020-06-04 DIAGNOSIS — S72401D Unspecified fracture of lower end of right femur, subsequent encounter for closed fracture with routine healing: Secondary | ICD-10-CM | POA: Diagnosis not present

## 2020-06-04 DIAGNOSIS — Z4789 Encounter for other orthopedic aftercare: Secondary | ICD-10-CM | POA: Diagnosis not present

## 2020-06-05 DIAGNOSIS — S72401D Unspecified fracture of lower end of right femur, subsequent encounter for closed fracture with routine healing: Secondary | ICD-10-CM | POA: Diagnosis not present

## 2020-06-05 DIAGNOSIS — R2681 Unsteadiness on feet: Secondary | ICD-10-CM | POA: Diagnosis not present

## 2020-06-05 DIAGNOSIS — M6281 Muscle weakness (generalized): Secondary | ICD-10-CM | POA: Diagnosis not present

## 2020-06-05 DIAGNOSIS — I5020 Unspecified systolic (congestive) heart failure: Secondary | ICD-10-CM | POA: Diagnosis not present

## 2020-06-05 DIAGNOSIS — Z4789 Encounter for other orthopedic aftercare: Secondary | ICD-10-CM | POA: Diagnosis not present

## 2020-06-06 DIAGNOSIS — Z4789 Encounter for other orthopedic aftercare: Secondary | ICD-10-CM | POA: Diagnosis not present

## 2020-06-06 DIAGNOSIS — M6281 Muscle weakness (generalized): Secondary | ICD-10-CM | POA: Diagnosis not present

## 2020-06-06 DIAGNOSIS — R2681 Unsteadiness on feet: Secondary | ICD-10-CM | POA: Diagnosis not present

## 2020-06-06 DIAGNOSIS — S72401D Unspecified fracture of lower end of right femur, subsequent encounter for closed fracture with routine healing: Secondary | ICD-10-CM | POA: Diagnosis not present

## 2020-06-06 DIAGNOSIS — I5020 Unspecified systolic (congestive) heart failure: Secondary | ICD-10-CM | POA: Diagnosis not present

## 2020-06-07 DIAGNOSIS — I10 Essential (primary) hypertension: Secondary | ICD-10-CM | POA: Diagnosis not present

## 2020-06-10 DIAGNOSIS — Z4789 Encounter for other orthopedic aftercare: Secondary | ICD-10-CM | POA: Diagnosis not present

## 2020-06-10 DIAGNOSIS — S72401D Unspecified fracture of lower end of right femur, subsequent encounter for closed fracture with routine healing: Secondary | ICD-10-CM | POA: Diagnosis not present

## 2020-06-10 DIAGNOSIS — I5020 Unspecified systolic (congestive) heart failure: Secondary | ICD-10-CM | POA: Diagnosis not present

## 2020-06-10 DIAGNOSIS — R2681 Unsteadiness on feet: Secondary | ICD-10-CM | POA: Diagnosis not present

## 2020-06-10 DIAGNOSIS — M6281 Muscle weakness (generalized): Secondary | ICD-10-CM | POA: Diagnosis not present

## 2020-06-12 DIAGNOSIS — Z4789 Encounter for other orthopedic aftercare: Secondary | ICD-10-CM | POA: Diagnosis not present

## 2020-06-12 DIAGNOSIS — S72401D Unspecified fracture of lower end of right femur, subsequent encounter for closed fracture with routine healing: Secondary | ICD-10-CM | POA: Diagnosis not present

## 2020-06-12 DIAGNOSIS — R2681 Unsteadiness on feet: Secondary | ICD-10-CM | POA: Diagnosis not present

## 2020-06-12 DIAGNOSIS — M6281 Muscle weakness (generalized): Secondary | ICD-10-CM | POA: Diagnosis not present

## 2020-06-12 DIAGNOSIS — I5020 Unspecified systolic (congestive) heart failure: Secondary | ICD-10-CM | POA: Diagnosis not present

## 2020-06-13 DIAGNOSIS — I5020 Unspecified systolic (congestive) heart failure: Secondary | ICD-10-CM | POA: Diagnosis not present

## 2020-06-13 DIAGNOSIS — M6281 Muscle weakness (generalized): Secondary | ICD-10-CM | POA: Diagnosis not present

## 2020-06-13 DIAGNOSIS — Z4789 Encounter for other orthopedic aftercare: Secondary | ICD-10-CM | POA: Diagnosis not present

## 2020-06-13 DIAGNOSIS — S72401D Unspecified fracture of lower end of right femur, subsequent encounter for closed fracture with routine healing: Secondary | ICD-10-CM | POA: Diagnosis not present

## 2020-06-13 DIAGNOSIS — R2681 Unsteadiness on feet: Secondary | ICD-10-CM | POA: Diagnosis not present

## 2020-06-17 DIAGNOSIS — R2681 Unsteadiness on feet: Secondary | ICD-10-CM | POA: Diagnosis not present

## 2020-06-17 DIAGNOSIS — I5020 Unspecified systolic (congestive) heart failure: Secondary | ICD-10-CM | POA: Diagnosis not present

## 2020-06-17 DIAGNOSIS — M6281 Muscle weakness (generalized): Secondary | ICD-10-CM | POA: Diagnosis not present

## 2020-06-17 DIAGNOSIS — S72401D Unspecified fracture of lower end of right femur, subsequent encounter for closed fracture with routine healing: Secondary | ICD-10-CM | POA: Diagnosis not present

## 2020-06-17 DIAGNOSIS — Z4789 Encounter for other orthopedic aftercare: Secondary | ICD-10-CM | POA: Diagnosis not present

## 2020-06-18 DIAGNOSIS — S72401D Unspecified fracture of lower end of right femur, subsequent encounter for closed fracture with routine healing: Secondary | ICD-10-CM | POA: Diagnosis not present

## 2020-06-19 DIAGNOSIS — R2681 Unsteadiness on feet: Secondary | ICD-10-CM | POA: Diagnosis not present

## 2020-06-19 DIAGNOSIS — Z4789 Encounter for other orthopedic aftercare: Secondary | ICD-10-CM | POA: Diagnosis not present

## 2020-06-19 DIAGNOSIS — M6281 Muscle weakness (generalized): Secondary | ICD-10-CM | POA: Diagnosis not present

## 2020-06-19 DIAGNOSIS — I5020 Unspecified systolic (congestive) heart failure: Secondary | ICD-10-CM | POA: Diagnosis not present

## 2020-06-19 DIAGNOSIS — S72401D Unspecified fracture of lower end of right femur, subsequent encounter for closed fracture with routine healing: Secondary | ICD-10-CM | POA: Diagnosis not present

## 2020-06-20 DIAGNOSIS — I5020 Unspecified systolic (congestive) heart failure: Secondary | ICD-10-CM | POA: Diagnosis not present

## 2020-06-20 DIAGNOSIS — S72401D Unspecified fracture of lower end of right femur, subsequent encounter for closed fracture with routine healing: Secondary | ICD-10-CM | POA: Diagnosis not present

## 2020-06-20 DIAGNOSIS — Z4789 Encounter for other orthopedic aftercare: Secondary | ICD-10-CM | POA: Diagnosis not present

## 2020-06-20 DIAGNOSIS — M6281 Muscle weakness (generalized): Secondary | ICD-10-CM | POA: Diagnosis not present

## 2020-06-20 DIAGNOSIS — R2681 Unsteadiness on feet: Secondary | ICD-10-CM | POA: Diagnosis not present

## 2020-06-24 DIAGNOSIS — I5020 Unspecified systolic (congestive) heart failure: Secondary | ICD-10-CM | POA: Diagnosis not present

## 2020-06-24 DIAGNOSIS — S72401D Unspecified fracture of lower end of right femur, subsequent encounter for closed fracture with routine healing: Secondary | ICD-10-CM | POA: Diagnosis not present

## 2020-06-24 DIAGNOSIS — R2681 Unsteadiness on feet: Secondary | ICD-10-CM | POA: Diagnosis not present

## 2020-06-24 DIAGNOSIS — M6281 Muscle weakness (generalized): Secondary | ICD-10-CM | POA: Diagnosis not present

## 2020-06-24 DIAGNOSIS — Z4789 Encounter for other orthopedic aftercare: Secondary | ICD-10-CM | POA: Diagnosis not present

## 2020-06-26 DIAGNOSIS — Z4789 Encounter for other orthopedic aftercare: Secondary | ICD-10-CM | POA: Diagnosis not present

## 2020-06-26 DIAGNOSIS — R2681 Unsteadiness on feet: Secondary | ICD-10-CM | POA: Diagnosis not present

## 2020-06-26 DIAGNOSIS — S72401D Unspecified fracture of lower end of right femur, subsequent encounter for closed fracture with routine healing: Secondary | ICD-10-CM | POA: Diagnosis not present

## 2020-06-26 DIAGNOSIS — M6281 Muscle weakness (generalized): Secondary | ICD-10-CM | POA: Diagnosis not present

## 2020-06-26 DIAGNOSIS — I5020 Unspecified systolic (congestive) heart failure: Secondary | ICD-10-CM | POA: Diagnosis not present

## 2020-06-27 DIAGNOSIS — M6281 Muscle weakness (generalized): Secondary | ICD-10-CM | POA: Diagnosis not present

## 2020-06-27 DIAGNOSIS — Z4789 Encounter for other orthopedic aftercare: Secondary | ICD-10-CM | POA: Diagnosis not present

## 2020-06-27 DIAGNOSIS — R2681 Unsteadiness on feet: Secondary | ICD-10-CM | POA: Diagnosis not present

## 2020-06-27 DIAGNOSIS — I5020 Unspecified systolic (congestive) heart failure: Secondary | ICD-10-CM | POA: Diagnosis not present

## 2020-06-27 DIAGNOSIS — S72401D Unspecified fracture of lower end of right femur, subsequent encounter for closed fracture with routine healing: Secondary | ICD-10-CM | POA: Diagnosis not present

## 2020-07-01 DIAGNOSIS — Z4789 Encounter for other orthopedic aftercare: Secondary | ICD-10-CM | POA: Diagnosis not present

## 2020-07-01 DIAGNOSIS — M6281 Muscle weakness (generalized): Secondary | ICD-10-CM | POA: Diagnosis not present

## 2020-07-01 DIAGNOSIS — R2681 Unsteadiness on feet: Secondary | ICD-10-CM | POA: Diagnosis not present

## 2020-07-01 DIAGNOSIS — I5020 Unspecified systolic (congestive) heart failure: Secondary | ICD-10-CM | POA: Diagnosis not present

## 2020-07-01 DIAGNOSIS — S72401D Unspecified fracture of lower end of right femur, subsequent encounter for closed fracture with routine healing: Secondary | ICD-10-CM | POA: Diagnosis not present

## 2020-07-03 DIAGNOSIS — M6281 Muscle weakness (generalized): Secondary | ICD-10-CM | POA: Diagnosis not present

## 2020-07-03 DIAGNOSIS — S72401D Unspecified fracture of lower end of right femur, subsequent encounter for closed fracture with routine healing: Secondary | ICD-10-CM | POA: Diagnosis not present

## 2020-07-03 DIAGNOSIS — I5020 Unspecified systolic (congestive) heart failure: Secondary | ICD-10-CM | POA: Diagnosis not present

## 2020-07-03 DIAGNOSIS — Z4789 Encounter for other orthopedic aftercare: Secondary | ICD-10-CM | POA: Diagnosis not present

## 2020-07-03 DIAGNOSIS — R2681 Unsteadiness on feet: Secondary | ICD-10-CM | POA: Diagnosis not present

## 2020-07-04 DIAGNOSIS — I5020 Unspecified systolic (congestive) heart failure: Secondary | ICD-10-CM | POA: Diagnosis not present

## 2020-07-04 DIAGNOSIS — R2681 Unsteadiness on feet: Secondary | ICD-10-CM | POA: Diagnosis not present

## 2020-07-04 DIAGNOSIS — Z4789 Encounter for other orthopedic aftercare: Secondary | ICD-10-CM | POA: Diagnosis not present

## 2020-07-04 DIAGNOSIS — M6281 Muscle weakness (generalized): Secondary | ICD-10-CM | POA: Diagnosis not present

## 2020-07-04 DIAGNOSIS — S72401D Unspecified fracture of lower end of right femur, subsequent encounter for closed fracture with routine healing: Secondary | ICD-10-CM | POA: Diagnosis not present

## 2020-07-08 DIAGNOSIS — M6281 Muscle weakness (generalized): Secondary | ICD-10-CM | POA: Diagnosis not present

## 2020-07-08 DIAGNOSIS — I5020 Unspecified systolic (congestive) heart failure: Secondary | ICD-10-CM | POA: Diagnosis not present

## 2020-07-08 DIAGNOSIS — R2681 Unsteadiness on feet: Secondary | ICD-10-CM | POA: Diagnosis not present

## 2020-07-08 DIAGNOSIS — Z4789 Encounter for other orthopedic aftercare: Secondary | ICD-10-CM | POA: Diagnosis not present

## 2020-07-08 DIAGNOSIS — S72401D Unspecified fracture of lower end of right femur, subsequent encounter for closed fracture with routine healing: Secondary | ICD-10-CM | POA: Diagnosis not present

## 2020-07-09 DIAGNOSIS — R2681 Unsteadiness on feet: Secondary | ICD-10-CM | POA: Diagnosis not present

## 2020-07-09 DIAGNOSIS — I5020 Unspecified systolic (congestive) heart failure: Secondary | ICD-10-CM | POA: Diagnosis not present

## 2020-07-09 DIAGNOSIS — S72401D Unspecified fracture of lower end of right femur, subsequent encounter for closed fracture with routine healing: Secondary | ICD-10-CM | POA: Diagnosis not present

## 2020-07-09 DIAGNOSIS — Z4789 Encounter for other orthopedic aftercare: Secondary | ICD-10-CM | POA: Diagnosis not present

## 2020-07-09 DIAGNOSIS — M6281 Muscle weakness (generalized): Secondary | ICD-10-CM | POA: Diagnosis not present

## 2020-07-11 DIAGNOSIS — Z4789 Encounter for other orthopedic aftercare: Secondary | ICD-10-CM | POA: Diagnosis not present

## 2020-07-11 DIAGNOSIS — R2681 Unsteadiness on feet: Secondary | ICD-10-CM | POA: Diagnosis not present

## 2020-07-11 DIAGNOSIS — S72401D Unspecified fracture of lower end of right femur, subsequent encounter for closed fracture with routine healing: Secondary | ICD-10-CM | POA: Diagnosis not present

## 2020-07-11 DIAGNOSIS — M6281 Muscle weakness (generalized): Secondary | ICD-10-CM | POA: Diagnosis not present

## 2020-07-11 DIAGNOSIS — I5020 Unspecified systolic (congestive) heart failure: Secondary | ICD-10-CM | POA: Diagnosis not present

## 2020-07-15 DIAGNOSIS — S72401D Unspecified fracture of lower end of right femur, subsequent encounter for closed fracture with routine healing: Secondary | ICD-10-CM | POA: Diagnosis not present

## 2020-07-15 DIAGNOSIS — M6281 Muscle weakness (generalized): Secondary | ICD-10-CM | POA: Diagnosis not present

## 2020-07-15 DIAGNOSIS — R2681 Unsteadiness on feet: Secondary | ICD-10-CM | POA: Diagnosis not present

## 2020-07-15 DIAGNOSIS — I5020 Unspecified systolic (congestive) heart failure: Secondary | ICD-10-CM | POA: Diagnosis not present

## 2020-07-15 DIAGNOSIS — Z4789 Encounter for other orthopedic aftercare: Secondary | ICD-10-CM | POA: Diagnosis not present

## 2020-07-16 DIAGNOSIS — S72401D Unspecified fracture of lower end of right femur, subsequent encounter for closed fracture with routine healing: Secondary | ICD-10-CM | POA: Diagnosis not present

## 2020-07-16 DIAGNOSIS — I5020 Unspecified systolic (congestive) heart failure: Secondary | ICD-10-CM | POA: Diagnosis not present

## 2020-07-16 DIAGNOSIS — R2681 Unsteadiness on feet: Secondary | ICD-10-CM | POA: Diagnosis not present

## 2020-07-16 DIAGNOSIS — L602 Onychogryphosis: Secondary | ICD-10-CM | POA: Diagnosis not present

## 2020-07-16 DIAGNOSIS — Z4789 Encounter for other orthopedic aftercare: Secondary | ICD-10-CM | POA: Diagnosis not present

## 2020-07-16 DIAGNOSIS — M6281 Muscle weakness (generalized): Secondary | ICD-10-CM | POA: Diagnosis not present

## 2020-07-16 DIAGNOSIS — I739 Peripheral vascular disease, unspecified: Secondary | ICD-10-CM | POA: Diagnosis not present

## 2020-07-18 DIAGNOSIS — I5020 Unspecified systolic (congestive) heart failure: Secondary | ICD-10-CM | POA: Diagnosis not present

## 2020-07-18 DIAGNOSIS — M6281 Muscle weakness (generalized): Secondary | ICD-10-CM | POA: Diagnosis not present

## 2020-07-18 DIAGNOSIS — S72401D Unspecified fracture of lower end of right femur, subsequent encounter for closed fracture with routine healing: Secondary | ICD-10-CM | POA: Diagnosis not present

## 2020-07-18 DIAGNOSIS — Z4789 Encounter for other orthopedic aftercare: Secondary | ICD-10-CM | POA: Diagnosis not present

## 2020-07-18 DIAGNOSIS — R2681 Unsteadiness on feet: Secondary | ICD-10-CM | POA: Diagnosis not present

## 2020-07-23 DIAGNOSIS — Z4789 Encounter for other orthopedic aftercare: Secondary | ICD-10-CM | POA: Diagnosis not present

## 2020-07-23 DIAGNOSIS — I5020 Unspecified systolic (congestive) heart failure: Secondary | ICD-10-CM | POA: Diagnosis not present

## 2020-07-23 DIAGNOSIS — R2681 Unsteadiness on feet: Secondary | ICD-10-CM | POA: Diagnosis not present

## 2020-07-23 DIAGNOSIS — S72401D Unspecified fracture of lower end of right femur, subsequent encounter for closed fracture with routine healing: Secondary | ICD-10-CM | POA: Diagnosis not present

## 2020-07-23 DIAGNOSIS — M6281 Muscle weakness (generalized): Secondary | ICD-10-CM | POA: Diagnosis not present

## 2020-07-24 DIAGNOSIS — S72401D Unspecified fracture of lower end of right femur, subsequent encounter for closed fracture with routine healing: Secondary | ICD-10-CM | POA: Diagnosis not present

## 2020-07-24 DIAGNOSIS — M6281 Muscle weakness (generalized): Secondary | ICD-10-CM | POA: Diagnosis not present

## 2020-07-24 DIAGNOSIS — Z4789 Encounter for other orthopedic aftercare: Secondary | ICD-10-CM | POA: Diagnosis not present

## 2020-07-24 DIAGNOSIS — I5020 Unspecified systolic (congestive) heart failure: Secondary | ICD-10-CM | POA: Diagnosis not present

## 2020-07-24 DIAGNOSIS — R2681 Unsteadiness on feet: Secondary | ICD-10-CM | POA: Diagnosis not present

## 2020-07-25 DIAGNOSIS — I5020 Unspecified systolic (congestive) heart failure: Secondary | ICD-10-CM | POA: Diagnosis not present

## 2020-07-25 DIAGNOSIS — R2681 Unsteadiness on feet: Secondary | ICD-10-CM | POA: Diagnosis not present

## 2020-07-25 DIAGNOSIS — M6281 Muscle weakness (generalized): Secondary | ICD-10-CM | POA: Diagnosis not present

## 2020-07-25 DIAGNOSIS — S72401D Unspecified fracture of lower end of right femur, subsequent encounter for closed fracture with routine healing: Secondary | ICD-10-CM | POA: Diagnosis not present

## 2020-07-25 DIAGNOSIS — Z4789 Encounter for other orthopedic aftercare: Secondary | ICD-10-CM | POA: Diagnosis not present

## 2020-07-30 DIAGNOSIS — S72401D Unspecified fracture of lower end of right femur, subsequent encounter for closed fracture with routine healing: Secondary | ICD-10-CM | POA: Diagnosis not present

## 2020-08-08 DIAGNOSIS — F411 Generalized anxiety disorder: Secondary | ICD-10-CM | POA: Diagnosis not present

## 2020-08-08 DIAGNOSIS — F331 Major depressive disorder, recurrent, moderate: Secondary | ICD-10-CM | POA: Diagnosis not present

## 2020-08-08 DIAGNOSIS — G4700 Insomnia, unspecified: Secondary | ICD-10-CM | POA: Diagnosis not present

## 2020-08-08 DIAGNOSIS — F410 Panic disorder [episodic paroxysmal anxiety] without agoraphobia: Secondary | ICD-10-CM | POA: Diagnosis not present

## 2020-08-09 DIAGNOSIS — F411 Generalized anxiety disorder: Secondary | ICD-10-CM | POA: Diagnosis not present

## 2020-08-09 DIAGNOSIS — F410 Panic disorder [episodic paroxysmal anxiety] without agoraphobia: Secondary | ICD-10-CM | POA: Diagnosis not present

## 2020-08-09 DIAGNOSIS — F331 Major depressive disorder, recurrent, moderate: Secondary | ICD-10-CM | POA: Diagnosis not present

## 2020-08-09 DIAGNOSIS — G4700 Insomnia, unspecified: Secondary | ICD-10-CM | POA: Diagnosis not present

## 2020-08-11 DIAGNOSIS — R251 Tremor, unspecified: Secondary | ICD-10-CM | POA: Diagnosis not present

## 2020-08-11 DIAGNOSIS — R0600 Dyspnea, unspecified: Secondary | ICD-10-CM | POA: Diagnosis not present

## 2020-08-11 DIAGNOSIS — I10 Essential (primary) hypertension: Secondary | ICD-10-CM | POA: Diagnosis not present

## 2020-08-11 DIAGNOSIS — D638 Anemia in other chronic diseases classified elsewhere: Secondary | ICD-10-CM | POA: Diagnosis not present

## 2020-08-11 DIAGNOSIS — E039 Hypothyroidism, unspecified: Secondary | ICD-10-CM | POA: Diagnosis not present

## 2020-08-11 DIAGNOSIS — G4733 Obstructive sleep apnea (adult) (pediatric): Secondary | ICD-10-CM | POA: Diagnosis not present

## 2020-08-12 DIAGNOSIS — E119 Type 2 diabetes mellitus without complications: Secondary | ICD-10-CM | POA: Diagnosis not present

## 2020-08-12 DIAGNOSIS — I509 Heart failure, unspecified: Secondary | ICD-10-CM | POA: Diagnosis not present

## 2020-08-12 DIAGNOSIS — E039 Hypothyroidism, unspecified: Secondary | ICD-10-CM | POA: Diagnosis not present

## 2020-08-12 DIAGNOSIS — D509 Iron deficiency anemia, unspecified: Secondary | ICD-10-CM | POA: Diagnosis not present

## 2020-08-12 DIAGNOSIS — I10 Essential (primary) hypertension: Secondary | ICD-10-CM | POA: Diagnosis not present

## 2020-08-25 DIAGNOSIS — M810 Age-related osteoporosis without current pathological fracture: Secondary | ICD-10-CM | POA: Diagnosis not present

## 2020-08-25 DIAGNOSIS — R269 Unspecified abnormalities of gait and mobility: Secondary | ICD-10-CM | POA: Diagnosis not present

## 2020-08-25 DIAGNOSIS — I10 Essential (primary) hypertension: Secondary | ICD-10-CM | POA: Diagnosis not present

## 2020-08-25 DIAGNOSIS — G4733 Obstructive sleep apnea (adult) (pediatric): Secondary | ICD-10-CM | POA: Diagnosis not present

## 2020-08-25 DIAGNOSIS — S72001A Fracture of unspecified part of neck of right femur, initial encounter for closed fracture: Secondary | ICD-10-CM | POA: Diagnosis not present

## 2020-09-05 DIAGNOSIS — H04123 Dry eye syndrome of bilateral lacrimal glands: Secondary | ICD-10-CM | POA: Diagnosis not present

## 2020-09-05 DIAGNOSIS — I10 Essential (primary) hypertension: Secondary | ICD-10-CM | POA: Diagnosis not present

## 2020-09-05 DIAGNOSIS — Z961 Presence of intraocular lens: Secondary | ICD-10-CM | POA: Diagnosis not present

## 2021-01-17 ENCOUNTER — Other Ambulatory Visit (HOSPITAL_COMMUNITY): Payer: Self-pay | Admitting: Hematology & Oncology

## 2021-01-17 ENCOUNTER — Other Ambulatory Visit: Payer: Self-pay | Admitting: Hematology & Oncology

## 2021-01-17 DIAGNOSIS — S728X2D Other fracture of left femur, subsequent encounter for closed fracture with routine healing: Secondary | ICD-10-CM

## 2021-01-17 DIAGNOSIS — S72401D Unspecified fracture of lower end of right femur, subsequent encounter for closed fracture with routine healing: Secondary | ICD-10-CM

## 2021-01-27 ENCOUNTER — Other Ambulatory Visit (HOSPITAL_COMMUNITY): Payer: Self-pay | Admitting: Hematology & Oncology

## 2021-01-27 ENCOUNTER — Encounter (HOSPITAL_COMMUNITY): Payer: Self-pay

## 2021-01-27 ENCOUNTER — Ambulatory Visit (HOSPITAL_COMMUNITY)
Admission: RE | Admit: 2021-01-27 | Discharge: 2021-01-27 | Disposition: A | Discharge status: Home / Self Care | Payer: Medicare Other | Source: Ambulatory Visit | Attending: Hematology & Oncology | Admitting: Hematology & Oncology

## 2021-01-27 ENCOUNTER — Other Ambulatory Visit: Payer: Self-pay

## 2021-01-27 DIAGNOSIS — S728X2D Other fracture of left femur, subsequent encounter for closed fracture with routine healing: Secondary | ICD-10-CM | POA: Diagnosis present

## 2021-01-27 DIAGNOSIS — S72401D Unspecified fracture of lower end of right femur, subsequent encounter for closed fracture with routine healing: Secondary | ICD-10-CM

## 2021-02-03 ENCOUNTER — Encounter (HOSPITAL_COMMUNITY): Payer: Self-pay

## 2021-02-03 ENCOUNTER — Emergency Department (HOSPITAL_COMMUNITY): Payer: Medicare Other

## 2021-02-03 ENCOUNTER — Other Ambulatory Visit: Payer: Self-pay

## 2021-02-03 ENCOUNTER — Inpatient Hospital Stay (HOSPITAL_COMMUNITY)
Admission: EM | Admit: 2021-02-03 | Discharge: 2021-02-07 | DRG: 522 | Disposition: A | Discharge status: Skilled Nursing Facility | Payer: Medicare Other | Source: Ambulatory Visit | Attending: Family Medicine | Admitting: Family Medicine

## 2021-02-03 DIAGNOSIS — M545 Low back pain, unspecified: Secondary | ICD-10-CM | POA: Diagnosis present

## 2021-02-03 DIAGNOSIS — Z9989 Dependence on other enabling machines and devices: Secondary | ICD-10-CM

## 2021-02-03 DIAGNOSIS — I10 Essential (primary) hypertension: Secondary | ICD-10-CM | POA: Diagnosis not present

## 2021-02-03 DIAGNOSIS — W19XXXD Unspecified fall, subsequent encounter: Secondary | ICD-10-CM

## 2021-02-03 DIAGNOSIS — I4891 Unspecified atrial fibrillation: Secondary | ICD-10-CM | POA: Diagnosis not present

## 2021-02-03 DIAGNOSIS — S72002A Fracture of unspecified part of neck of left femur, initial encounter for closed fracture: Secondary | ICD-10-CM | POA: Diagnosis not present

## 2021-02-03 DIAGNOSIS — Z9981 Dependence on supplemental oxygen: Secondary | ICD-10-CM

## 2021-02-03 DIAGNOSIS — Z79890 Hormone replacement therapy (postmenopausal): Secondary | ICD-10-CM | POA: Diagnosis not present

## 2021-02-03 DIAGNOSIS — Z20822 Contact with and (suspected) exposure to covid-19: Secondary | ICD-10-CM | POA: Diagnosis not present

## 2021-02-03 DIAGNOSIS — R9431 Abnormal electrocardiogram [ECG] [EKG]: Secondary | ICD-10-CM

## 2021-02-03 DIAGNOSIS — E785 Hyperlipidemia, unspecified: Secondary | ICD-10-CM | POA: Diagnosis not present

## 2021-02-03 DIAGNOSIS — Z8542 Personal history of malignant neoplasm of other parts of uterus: Secondary | ICD-10-CM | POA: Diagnosis not present

## 2021-02-03 DIAGNOSIS — E039 Hypothyroidism, unspecified: Secondary | ICD-10-CM | POA: Diagnosis present

## 2021-02-03 DIAGNOSIS — I5032 Chronic diastolic (congestive) heart failure: Secondary | ICD-10-CM | POA: Diagnosis present

## 2021-02-03 DIAGNOSIS — I2510 Atherosclerotic heart disease of native coronary artery without angina pectoris: Secondary | ICD-10-CM | POA: Diagnosis present

## 2021-02-03 DIAGNOSIS — W07XXXA Fall from chair, initial encounter: Secondary | ICD-10-CM | POA: Diagnosis present

## 2021-02-03 DIAGNOSIS — M419 Scoliosis, unspecified: Secondary | ICD-10-CM | POA: Diagnosis not present

## 2021-02-03 DIAGNOSIS — Z8673 Personal history of transient ischemic attack (TIA), and cerebral infarction without residual deficits: Secondary | ICD-10-CM | POA: Diagnosis not present

## 2021-02-03 DIAGNOSIS — Z6841 Body Mass Index (BMI) 40.0 and over, adult: Secondary | ICD-10-CM | POA: Diagnosis not present

## 2021-02-03 DIAGNOSIS — G8929 Other chronic pain: Secondary | ICD-10-CM | POA: Diagnosis present

## 2021-02-03 DIAGNOSIS — G4733 Obstructive sleep apnea (adult) (pediatric): Secondary | ICD-10-CM

## 2021-02-03 DIAGNOSIS — Z86718 Personal history of other venous thrombosis and embolism: Secondary | ICD-10-CM

## 2021-02-03 DIAGNOSIS — Z7982 Long term (current) use of aspirin: Secondary | ICD-10-CM | POA: Diagnosis not present

## 2021-02-03 DIAGNOSIS — Z79899 Other long term (current) drug therapy: Secondary | ICD-10-CM | POA: Diagnosis not present

## 2021-02-03 DIAGNOSIS — M109 Gout, unspecified: Secondary | ICD-10-CM | POA: Diagnosis not present

## 2021-02-03 DIAGNOSIS — I110 Hypertensive heart disease with heart failure: Secondary | ICD-10-CM | POA: Diagnosis present

## 2021-02-03 DIAGNOSIS — Z923 Personal history of irradiation: Secondary | ICD-10-CM

## 2021-02-03 DIAGNOSIS — Z888 Allergy status to other drugs, medicaments and biological substances status: Secondary | ICD-10-CM

## 2021-02-03 DIAGNOSIS — F32A Depression, unspecified: Secondary | ICD-10-CM | POA: Diagnosis present

## 2021-02-03 DIAGNOSIS — Z8701 Personal history of pneumonia (recurrent): Secondary | ICD-10-CM

## 2021-02-03 DIAGNOSIS — Z8249 Family history of ischemic heart disease and other diseases of the circulatory system: Secondary | ICD-10-CM

## 2021-02-03 DIAGNOSIS — S7292XA Unspecified fracture of left femur, initial encounter for closed fracture: Secondary | ICD-10-CM | POA: Diagnosis present

## 2021-02-03 DIAGNOSIS — R350 Frequency of micturition: Secondary | ICD-10-CM | POA: Diagnosis present

## 2021-02-03 DIAGNOSIS — S72012A Unspecified intracapsular fracture of left femur, initial encounter for closed fracture: Secondary | ICD-10-CM | POA: Diagnosis present

## 2021-02-03 DIAGNOSIS — K219 Gastro-esophageal reflux disease without esophagitis: Secondary | ICD-10-CM | POA: Diagnosis present

## 2021-02-03 DIAGNOSIS — S82122A Displaced fracture of lateral condyle of left tibia, initial encounter for closed fracture: Secondary | ICD-10-CM

## 2021-02-03 DIAGNOSIS — Z9221 Personal history of antineoplastic chemotherapy: Secondary | ICD-10-CM

## 2021-02-03 HISTORY — DX: Unspecified fracture of left femur, initial encounter for closed fracture: S72.92XA

## 2021-02-03 HISTORY — DX: Unspecified atrial fibrillation: I48.91

## 2021-02-03 LAB — CBC WITH DIFFERENTIAL/PLATELET
Abs Immature Granulocytes: 0.02 10*3/uL (ref 0.00–0.07)
Basophils Absolute: 0.1 10*3/uL (ref 0.0–0.1)
Basophils Relative: 1 %
Eosinophils Absolute: 0.1 10*3/uL (ref 0.0–0.5)
Eosinophils Relative: 1 %
HCT: 46.9 % — ABNORMAL HIGH (ref 36.0–46.0)
Hemoglobin: 15.1 g/dL — ABNORMAL HIGH (ref 12.0–15.0)
Immature Granulocytes: 0 %
Lymphocytes Relative: 16 %
Lymphs Abs: 1.4 10*3/uL (ref 0.7–4.0)
MCH: 30.9 pg (ref 26.0–34.0)
MCHC: 32.2 g/dL (ref 30.0–36.0)
MCV: 96.1 fL (ref 80.0–100.0)
Monocytes Absolute: 0.8 10*3/uL (ref 0.1–1.0)
Monocytes Relative: 9 %
Neutro Abs: 6.6 10*3/uL (ref 1.7–7.7)
Neutrophils Relative %: 73 %
Platelets: 359 10*3/uL (ref 150–400)
RBC: 4.88 MIL/uL (ref 3.87–5.11)
RDW: 13.4 % (ref 11.5–15.5)
WBC: 9 10*3/uL (ref 4.0–10.5)
nRBC: 0 % (ref 0.0–0.2)

## 2021-02-03 LAB — BASIC METABOLIC PANEL
Anion gap: 13 (ref 5–15)
BUN: 14 mg/dL (ref 8–23)
CO2: 27 mmol/L (ref 22–32)
Calcium: 10.2 mg/dL (ref 8.9–10.3)
Chloride: 98 mmol/L (ref 98–111)
Creatinine, Ser: 0.66 mg/dL (ref 0.44–1.00)
GFR, Estimated: >60 mL/min (ref >60)
Glucose, Bld: 191 mg/dL — ABNORMAL HIGH (ref 70–99)
Potassium: 3.9 mmol/L (ref 3.5–5.1)
Sodium: 138 mmol/L (ref 135–145)

## 2021-02-03 LAB — TYPE AND SCREEN
ABO/RH(D): A NEG
Antibody Screen: NEGATIVE

## 2021-02-03 LAB — SURGICAL PCR SCREEN
MRSA, PCR: NEGATIVE
Staphylococcus aureus: NEGATIVE

## 2021-02-03 LAB — PROTIME-INR
INR: 1 (ref 0.8–1.2)
Prothrombin Time: 13.3 seconds (ref 11.4–15.2)

## 2021-02-03 MED ORDER — SODIUM CHLORIDE 0.9 % IV BOLUS
500.0000 mL | Freq: Once | INTRAVENOUS | Status: AC
  Administered 2021-02-03: 500 mL via INTRAVENOUS

## 2021-02-03 MED ORDER — ONDANSETRON HCL 4 MG/2ML IJ SOLN: 4.0000 mg | Freq: Once | INTRAMUSCULAR | Status: DC

## 2021-02-03 MED ORDER — FENTANYL CITRATE (PF) 100 MCG/2ML IJ SOLN: 25.0000 ug | INTRAMUSCULAR | Status: DC | PRN

## 2021-02-03 MED ORDER — FENTANYL CITRATE (PF) 100 MCG/2ML IJ SOLN
50.0000 ug | Freq: Once | INTRAMUSCULAR | Status: AC
  Administered 2021-02-03: 50 ug via INTRAMUSCULAR
  Filled 2021-02-03: qty 2

## 2021-02-03 MED ORDER — TRAZODONE HCL 50 MG PO TABS
50.0000 mg | ORAL_TABLET | Freq: Once | ORAL | Status: AC
  Administered 2021-02-03: 50 mg via ORAL
  Filled 2021-02-03: qty 1

## 2021-02-03 MED ORDER — MORPHINE SULFATE (PF) 2 MG/ML IV SOLN
0.5000 mg | INTRAVENOUS | Status: DC | PRN
Start: 2021-02-03 — End: 2021-02-07
  Administered 2021-02-04 – 2021-02-06 (×2): 0.5 mg via INTRAVENOUS
  Filled 2021-02-03 (×2): qty 1

## 2021-02-03 MED ORDER — HYDROCODONE-ACETAMINOPHEN 5-325 MG PO TABS
1.0000 | ORAL_TABLET | Freq: Four times a day (QID) | ORAL | Status: DC | PRN
  Administered 2021-02-03: 2 via ORAL
  Administered 2021-02-04: 1 via ORAL
  Administered 2021-02-04: 2 via ORAL
  Filled 2021-02-03 (×2): qty 2
  Filled 2021-02-03: qty 1

## 2021-02-03 MED ORDER — SENNA 8.6 MG PO TABS
1.0000 | ORAL_TABLET | Freq: Two times a day (BID) | ORAL | Status: DC
  Administered 2021-02-04 – 2021-02-07 (×6): 8.6 mg via ORAL
  Filled 2021-02-03 (×6): qty 1

## 2021-02-03 NOTE — ED Provider Notes (Signed)
[facility] DEPT Provider Note   CSN: [id number] Arrival date & time: 02/03/21  1546     History Chief Complaint  Patient presents with  . Hip Pain  . Fall    Melinda Kerr is a 86 y.o. female.  Patient is an 86 year old female with a history of DVT, CHF, diverticulitis, obesity, diabetes and prior uterine cancer who is presenting today with ongoing left hip pain.  Patient reports that she was sitting in her lift chair when she fell out onto the floor on her left hip.  Patient reports the pain is worse if you tried to move her leg.  She denies hitting her head or loss of consciousness.  She reports today was otherwise a normal day before she slid out of the chair.  She has not been feeling ill.  She does not take any anticoagulation.  The history is provided by the patient.  Hip Pain This is a new problem. The current episode started 6 to 12 hours ago. The problem occurs constantly. The problem has not changed since onset.Associated symptoms comments: Left hip. The symptoms are aggravated by bending and twisting. Nothing relieves the symptoms. She has tried nothing for the symptoms. The treatment provided no relief.  Fall       Past Medical History:  Diagnosis Date  . Arthritis    "shoulders, knees, lower back" (07/14/2017)  . CHF (congestive heart failure) ([place])    "while in hospital w/hysterectomy in 2017"  . Chronic lower back pain   . Depression   . Diverticulitis of large intestine with abscess 07/13/2017  . DVT (deep venous thrombosis) ([place])   . Dyspnea    "cause I'm over weight"  . Esophageal reflux   . Gout   . Hyperlipidemia    under control  . Hypertension   . Hypothyroidism   . Morbid obesity with BMI of 40.0-44.9, adult ([place])   . Nocturia   . OSA on CPAP    "not wearing it when I sleep in my lift chair" (07/14/2017)  . Osteoarthritis   . Phlebitis    hx of  . Pneumonia ?1989  . Spondylosis    with scoliosis  . Type II  diabetes mellitus (HCC)    borderline , diet controlled   . Urinary frequency   . Uterine cancer (HCC)    S/P chemo, radiation, hysterectomy    Patient Active Problem List   Diagnosis Date Noted  . Closed fracture of right distal femur ([place]) 05/08/2020  . Acute CVA (cerebrovascular accident) ([place]) 05/15/2019  . TIA (transient ischemic attack) 05/14/2019  . Displaced comminuted fracture of shaft of right femur, initial encounter for closed fracture ([place]) 04/08/2019  . GIB (gastrointestinal bleeding) 11/13/2018  . Anemia associated with acute blood loss 11/13/2018  . Closed fracture of right proximal humerus 10/26/2018  . S/P reverse total shoulder arthroplasty, right 10/26/2018  . Acute appendicitis   . Diverticulitis of large intestine with abscess 07/13/2017  . Colitis 07/13/2017  . Aortic atherosclerosis ([place]) 09/12/2016  . Coronary artery calcification 09/12/2016  . Hiatal hernia 09/12/2016  . Lumbar disc disease 09/12/2016  . Chemotherapy-induced thrombocytopenia 08/04/2016  . Chemotherapy induced neutropenia ([place]) 05/26/2016  . Port catheter in place 05/26/2016  . Encounter for antineoplastic chemotherapy 05/26/2016  . Urinary frequency 04/24/2016  . Obstructive sleep apnea on CPAP 04/24/2016  . History of DVT (deep vein thrombosis) 04/24/2016  . Endometrial cancer, FIGO stage IIIC ([place]) 04/24/2016  . Malignant neoplasm of  endometrium ([place]) 03/26/2016  . Dyspnea on exertion 03/19/2016  . Malignant neoplasm of uterus ([place]) 03/19/2016  . Status post right knee replacement 04/16/2014  . Essential hypertension 04/16/2014  . Postoperative anemia due to acute blood loss 04/12/2014  . Status post total left knee replacement 10/14/2013  . Hypothyroidism 10/14/2013  . UI (urinary incontinence) 10/14/2013  . Dyslipidemia 10/14/2013  . Constipation 10/14/2013  . Gout 10/14/2013  . Anemia 10/14/2013  . Allergic rhinitis 10/14/2013  . Insomnia 10/14/2013  . Depression  10/14/2013  . OA (osteoarthritis) of knee 10/09/2013  . OSA on CPAP 08/10/2013  . Morbid obesity with BMI of 45.0-49.9, adult [facility])     Past Surgical History:  Procedure Laterality Date  . APPENDECTOMY    . CATARACT EXTRACTION, BILATERAL Bilateral 2018  . COLONOSCOPY    . DILATATION & CURETTAGE/HYSTEROSCOPY WITH MYOSURE N/A 03/04/2016   Procedure: DILATATION & CURETTAGE/HYSTEROSCOPY ;  Surgeon: Azucena Fallen, MD;  Location: [facility];  Service: Gynecology;  Laterality: N/A;  polyp removal   . ESOPHAGOGASTRODUODENOSCOPY (EGD) WITH PROPOFOL N/A 11/14/2018   Procedure: ESOPHAGOGASTRODUODENOSCOPY (EGD) WITH PROPOFOL;  Surgeon: Wonda Horner, MD;  Location: [facility];  Service: Endoscopy;  Laterality: N/A;  . FEMUR IM NAIL Right 04/09/2019   Procedure: INTRAMEDULLARY (IM) NAIL INTERTROCH;  Surgeon: Renette Butters, MD;  Location: [facility];  Service: Orthopedics;  Laterality: Right;  . HARDWARE REMOVAL Right 05/09/2020   Procedure: HARDWARE REMOVAL RIGHT FEMUR;  Surgeon: Shona Needles, MD;  Location: [facility];  Service: Orthopedics;  Laterality: Right;  . HERNIA REPAIR    . IR GENERIC HISTORICAL  05/15/2016   IR US GUIDE VASC ACCESS RIGHT 05/15/2016 Aletta Edouard, MD [facility]  . IR GENERIC HISTORICAL  05/15/2016   IR FLUORO GUIDE CV LINE RIGHT 05/15/2016 Aletta Edouard, MD [facility]  . IR REMOVAL TUN ACCESS W/ PORT W/O FL MOD SED  04/19/2018  . LAPAROSCOPIC CHOLECYSTECTOMY  2004  . LAPAROSCOPIC INCISIONAL / UMBILICAL / VENTRAL HERNIA REPAIR  2004   "w/gallbladder OR"  . LYMPH NODE BIOPSY N/A 03/26/2016   Procedure: [place] LYMPH NODE BIOPSY;  Surgeon: Everitt Amber, MD;  Location: [facility];  Service: Gynecology;  Laterality: N/A;  . ORIF FEMUR FRACTURE Right 05/09/2020   Procedure: OPEN REDUCTION INTERNAL FIXATION (ORIF) DISTAL FEMUR FRACTURE;  Surgeon: Shona Needles, MD;  Location: [facility];  Service: Orthopedics;  Laterality: Right;  . REVERSE SHOULDER ARTHROPLASTY Right 10/26/2018   Procedure:  REVERSE SHOULDER ARTHROPLASTY;  Surgeon: Nicholes Stairs, MD;  Location: [facility];  Service: Orthopedics;  Laterality: Right;  . ROBOTIC ASSISTED TOTAL HYSTERECTOMY WITH BILATERAL SALPINGO OOPHERECTOMY N/A 03/26/2016   Procedure: XI ROBOTIC ASSISTED TOTAL Laproscopic HYSTERECTOMY WITH BILATERAL SALPINGO OOPHORECTOMY;  Surgeon: Everitt Amber, MD;  Location: [facility];  Service: Gynecology;  Laterality: N/A;  . TOTAL KNEE ARTHROPLASTY Left 10/09/2013   Procedure: LEFT TOTAL KNEE ARTHROPLASTY;  Surgeon: [name], MD;  Location: [facility];  Service: Orthopedics;  Laterality: Left;  . TOTAL KNEE ARTHROPLASTY Right 04/09/2014   Procedure: RIGHT TOTAL KNEE ARTHROPLASTY;  Surgeon: [name], MD;  Location: [facility];  Service: Orthopedics;  Laterality: Right;  . TUBAL LIGATION  1982  . VARICOSE VEIN SURGERY Bilateral 1968  . [place]     OB History   No obstetric history on file.     Family History  Problem Relation Age of Onset  . Heart disease Mother   . Heart disease Father   .  CVA Father     Social History   Tobacco Use  . Smoking status: Never Smoker  . Smokeless tobacco: Never Used  Vaping Use  . Vaping Use: Never used  Substance Use Topics  . Alcohol use: No  . Drug use: No    Home Medications Prior to Admission medications   Medication Sig Start Date End Date Taking? Authorizing Provider  acetaminophen (TYLENOL) 325 MG tablet Take 2 tablets (650 mg total) by mouth every 6 (six) hours as needed for mild pain or moderate pain (pain score 1-3 or temp > 100.5). 04/13/19   Bonnell Public, MD  albuterol (VENTOLIN HFA) 108 (90 Base) MCG/ACT inhaler Inhale 2 puffs into the lungs every 6 (six) hours as needed for wheezing or shortness of breath.    [provider]  allopurinol (ZYLOPRIM) 300 MG tablet Take 300 mg by mouth daily.     [provider]  ARTIFICIAL TEAR OP Place 1 drop into both eyes in the morning, at noon, in the evening, and  at bedtime.    [provider]  aspirin EC 81 MG tablet Take 81 mg by mouth daily. Swallow whole.    [provider]  baclofen (LIORESAL) 10 MG tablet Take 1 tablet (10 mg total) by mouth 3 (three) times daily as needed for muscle spasms. Patient taking differently: Take 10 mg by mouth at bedtime.  04/12/19   Swayze, Ava, DO  calcium carbonate (TUMS - DOSED IN MG ELEMENTAL CALCIUM) 500 MG chewable tablet Chew 1 tablet by mouth daily.    [provider]  cetirizine (ZYRTEC) 10 MG tablet Take 10 mg by mouth daily.    [provider]  citalopram (CELEXA) 10 MG tablet Take 10 mg by mouth daily. 03/20/19   [provider]  docusate sodium (COLACE) 100 MG capsule Take 1 capsule (100 mg total) by mouth 2 (two) times daily. Patient not taking: Reported on 05/08/2020 05/16/19   [place] Donnamarie Poag, DO  enoxaparin (LOVENOX) 40 MG/0.4ML injection Inject 0.4 mLs (40 mg total) into the skin daily. 05/11/20 06/10/20  Delray Alt, PA-C  furosemide (LASIX) 40 MG tablet Take 0.5 tablets (20 mg total) by mouth daily. 05/11/20   Lavina Hamman, MD  HYDROcodone-acetaminophen (NORCO/VICODIN) 5-325 MG tablet Take 1 tablet by mouth every 6 (six) hours as needed for severe pain. 05/11/20   Delray Alt, PA-C  hydrOXYzine (ATARAX/VISTARIL) 25 MG tablet Take 25 mg by mouth at bedtime.    [provider]  iron polysaccharides (NIFEREX) 150 MG capsule Take 150 mg by mouth daily.    [provider]  levothyroxine (SYNTHROID, LEVOTHROID) 125 MCG tablet Take 125 mcg by mouth daily at 6 (six) AM.     [provider]  metoCLOPramide (REGLAN) 5 MG tablet Take 5 mg by mouth 4 (four) times daily.    [provider]  nystatin (NYSTATIN) powder Apply 1 Bottle topically See admin instructions. Apply topically to abdominal skin folds and under bilateral breast for intertrigo - twice daily - please make sure skin has been cleaned and is dry before applying     [provider]  Omega-3 Fatty Acids (FISH OIL) 1000 MG CAPS Take 1,000 mg by mouth daily.     [provider]  OXYGEN Inhale 2 L into the lungs continuous. To maintain O2 sat greater then 90% every shift    [provider]  pantoprazole (PROTONIX) 40 MG tablet Take 1 tablet (40 mg total)  by mouth 2 (two) times daily before a meal. 11/17/18   Gherghe, Vella Redhead, MD  polyethylene glycol (MIRALAX / GLYCOLAX) packet Take 17 g by mouth daily as needed for mild constipation or moderate constipation. Patient not taking: Reported on 05/08/2020 04/12/14   Dara Lords, Alexzandrew L, PA-C  pravastatin (PRAVACHOL) 10 MG tablet Take 10 mg by mouth every Thursday. At bedtime 02/14/18   [provider]  pyridOXINE (VITAMIN B-6) 100 MG tablet Take 100 mg by mouth daily.    [provider]  saccharomyces boulardii (FLORASTOR) 250 MG capsule Take 250 mg by mouth 2 (two) times daily.    [provider]  senna-docusate (SENOKOT-S) 8.6-50 MG tablet Take 1 tablet by mouth daily.    [provider]  spironolactone (ALDACTONE) 25 MG tablet Take 25 mg by mouth daily.    [provider]  tolterodine (DETROL LA) 4 MG 24 hr capsule Take 4 mg by mouth every evening.     [provider]  traMADol (ULTRAM) 50 MG tablet Take 50 mg by mouth every 8 (eight) hours as needed for moderate pain.    [provider]  vitamin B-12 (CYANOCOBALAMIN) 1000 MCG tablet Take 1,000 mcg by mouth daily.     [provider]  vitamin C (ASCORBIC ACID) 500 MG tablet Take 500 mg by mouth daily.    [provider]  Vitamin D, Ergocalciferol, (DRISDOL) 1.25 MG (50000 UT) CAPS capsule Take 50,000 Units by mouth every 7 (seven) days. Monday    [provider]  zolpidem (AMBIEN) 10 MG tablet Take 1 tablet (10 mg total) by mouth at bedtime. 05/11/20   Lavina Hamman, MD    Allergies    Oxybutynin  Review of Systems   Review of Systems  All  other systems reviewed and are negative.   Physical Exam Updated Vital Signs BP (!) 133/97 (BP Location: Right Arm)   Pulse 86   Temp 99 F (37.2 C) (Oral)   Resp 20   Ht 5' (1.524 m)   Wt 111.1 kg   SpO2 98%   BMI 47.85 kg/m   Physical Exam Vitals and nursing note reviewed.  Constitutional:      General: She is not in acute distress.    Appearance: Normal appearance. She is well-developed.  HENT:     Head: Normocephalic and atraumatic.  Eyes:     Pupils: Pupils are equal, round, and reactive to light.  Cardiovascular:     Rate and Rhythm: Normal rate and regular rhythm.     Heart sounds: Normal heart sounds. No murmur heard. No friction rub.  Pulmonary:     Effort: Pulmonary effort is normal.     Breath sounds: Normal breath sounds. No wheezing or rales.  Abdominal:     General: Bowel sounds are normal. There is no distension.     Palpations: Abdomen is soft.     Tenderness: There is no abdominal tenderness. There is no guarding or rebound.  Musculoskeletal:        General: Tenderness and signs of injury present. Normal range of motion.     Comments: No edema.  Left lower extremity shortened and externally rotated.  Pain with any type of movement.  Right lower extremity seems at baseline.  No pain and full range of motion of the hip and knee  Skin:    General: Skin is warm and dry.     Findings: No rash.  Neurological:     Mental Status:  She is alert and oriented to person, place, and time. Mental status is at baseline.     Cranial Nerves: No cranial nerve deficit.  Psychiatric:        Mood and Affect: Mood normal.        Behavior: Behavior normal.     ED Results / Procedures / Treatments   Labs (all labs ordered are listed, but only abnormal results are displayed) Labs Reviewed  BASIC METABOLIC PANEL - Abnormal; Notable for the following components:      Result Value   Glucose, Bld 191 (*)    All other components within normal limits  CBC WITH  DIFFERENTIAL/PLATELET - Abnormal; Notable for the following components:   Hemoglobin 15.1 (*)    HCT 46.9 (*)    All other components within normal limits  SARS CORONAVIRUS 2 (TAT 6-24 HRS)  PROTIME-INR  TYPE AND SCREEN    EKG EKG Interpretation  Date/Time:  Monday Feb 03 2021 16:33:59 EDT Ventricular Rate:  82 PR Interval:    QRS Duration: 88 QT Interval:  561 QTC Calculation: 656 R Axis:   173 Text Interpretation: Atrial fibrillation Right axis deviation Low voltage, precordial leads Abnormal R-wave progression, late transition Borderline T abnormalities, diffuse leads Prolonged QT interval Confirmed by Blanchie Dessert [phone]) on 02/03/2021 5:00:41 PM   Radiology DG Hip Unilat With Pelvis 2-3 Views Left  Result Date: 02/03/2021 CLINICAL DATA:  Left hip pain. EXAM: DG HIP (WITH OR WITHOUT PELVIS) 2-3V LEFT COMPARISON:  None. FINDINGS: An acute fracture deformity is seen extending through the neck of the proximal left femur. Approximately 1/2 shaft width superior and lateral displacement of the distal fracture site is seen. There is no evidence of dislocation. A radiopaque intramedullary rod and compression screw device are seen within the proximal right femur. Degenerative changes are seen involving both hips in the form of joint space narrowing and acetabular sclerosis. Benign-appearing sclerosis is seen along the neck of the proximal right femur. Multiple phleboliths are noted within the pelvis. IMPRESSION: 1. Acute fracture of the proximal left femur. 2. Prior open reduction internal fixation of the proximal right femur. Electronically Signed   By: Virgina Norfolk M.D.   On: 02/03/2021 17:11    Procedures Procedures   Medications Ordered in ED Medications  fentaNYL (SUBLIMAZE) injection 25 mcg (has no administration in time range)  ondansetron (ZOFRAN) injection 4 mg (has no administration in time range)    ED Course  I have reviewed the triage vital signs and the nursing  notes.  Pertinent labs & imaging results that were available during my care of the patient were reviewed by me and considered in my medical decision making (see chart for details).    MDM Rules/Calculators/A&P                          Elderly female with multiple medical problems who is mostly bedbound but does stand and pivot presenting today after she fell out of a lift chair with significant pain in the left hip.  Her left hip is shortened and rotated.  Neurovascularly intact at this time.  Patient takes no anticoagulation.  She denies any head injury.  Hip fracture protocol initiated.  Patient given pain control.  6:29 PM On further investigation it appears that patient fell a few days ago.  She had a CT that showed a left hip femoral neck fracture 2 days ago.  She continues to have significant pain.  Dr. Lyla Glassing with orthopedics plans on fixing it on Wednesday.  Given patient's significant pain will be admitted until the surgery.  Labs are at baseline and reassuring.  EKG showed possible atrial fibrillation however there was significant artifact.  Patient's heart rate on reevaluation in the room is normal at 81 and regular.  MDM Number of Diagnoses or Management Options   Amount and/or Complexity of Data Reviewed Clinical lab tests: ordered and reviewed Tests in the radiology section of CPT: ordered and reviewed Tests in the medicine section of CPT: ordered and reviewed Independent visualization of images, tracings, or specimens: yes     Final Clinical Impression(s) / ED Diagnoses Final diagnoses:  Fall, subsequent encounter  Left displaced femoral neck fracture [facility])    Rx / DC Orders ED Discharge Orders    None       Blanchie Dessert, MD 02/03/21 1830

## 2021-02-03 NOTE — H&P (Signed)
History and Physical    Melinda Kerr [id number] DOB: 06-28-1935 DOA: 02/03/2021  PCP: Maury Dus, MD  Patient coming from: [facility]  I have personally briefly reviewed patient's old medical records in [place]  Chief Complaint: Fall  HPI: Melinda Kerr is a 86 y.o. female with medical history significant for hypertension, CAD, CVA, OSA on CPAP, hyperlipidemia, endometrial cancer in remission, s/p left total knee replacement, history of DVT who presents for mechanical fall.  Patient was sitting in her power lift chair today and lowered it too much to where she slid out and fell on her left hip.  Review, she had a prior CT of the left hip on 01/27/2021. Pt unsure who ordered this. I spoke with her HCPOA [name] yow on the phone and she says that pt was sent to [facility] after having persistent pain for a fall several weeks ago. However I do not see any documentation of an ER visit.  ED Course: She was afebrile and normotensive on room air.  CBC with no leukocytosis, hemoglobin elevated at 15.9.  BMP otherwise unremarkable.  Left hip x-ray shows left proximal femur fracture.  Orthopedic aware and will take to OR on Wed.   Review of Systems: Constitutional: No Weight Change, No Fever ENT/Mouth: No sore throat, No Rhinorrhea Eyes: No Eye Pain, No Vision Changes Cardiovascular: No Chest Pain, no SOB Respiratory: No Cough, No Sputum, No Wheezing, no Dyspnea  Gastrointestinal: No Nausea, No Vomiting, No Diarrhea, No Constipation, No Pain Genitourinary:  No Urgency, No Flank Pain Musculoskeletal: + Arthralgias, No Myalgias Skin: No Skin Lesions, No Pruritus, Neuro: no Weakness, No Numbness,  No Loss of Consciousness, No Syncope Psych: No Anxiety/Panic, No Depression, no decrease appetite Heme/Lymph: No Bruising, No Bleeding  Past Medical History:  Diagnosis Date  . Arthritis    "shoulders, knees, lower back" (07/14/2017)  . CHF (congestive heart failure) ([place])    "while  in hospital w/hysterectomy in 2017"  . Chronic lower back pain   . Depression   . Diverticulitis of large intestine with abscess 07/13/2017  . DVT (deep venous thrombosis) ([place])   . Dyspnea    "cause I'm over weight"  . Esophageal reflux   . Gout   . Hyperlipidemia    under control  . Hypertension   . Hypothyroidism   . Morbid obesity with BMI of 40.0-44.9, adult ([place])   . Nocturia   . OSA on CPAP    "not wearing it when I sleep in my lift chair" (07/14/2017)  . Osteoarthritis   . Phlebitis    hx of  . Pneumonia ?1989  . Spondylosis    with scoliosis  . Type II diabetes mellitus (HCC)    borderline , diet controlled   . Urinary frequency   . Uterine cancer (HCC)    S/P chemo, radiation, hysterectomy    Past Surgical History:  Procedure Laterality Date  . APPENDECTOMY    . CATARACT EXTRACTION, BILATERAL Bilateral 2018  . COLONOSCOPY    . DILATATION & CURETTAGE/HYSTEROSCOPY WITH MYOSURE N/A 03/04/2016   Procedure: DILATATION & CURETTAGE/HYSTEROSCOPY ;  Surgeon: Azucena Fallen, MD;  Location: [facility];  Service: Gynecology;  Laterality: N/A;  polyp removal   . ESOPHAGOGASTRODUODENOSCOPY (EGD) WITH PROPOFOL N/A 11/14/2018   Procedure: ESOPHAGOGASTRODUODENOSCOPY (EGD) WITH PROPOFOL;  Surgeon: Wonda Horner, MD;  Location: [facility];  Service: Endoscopy;  Laterality: N/A;  . FEMUR IM NAIL Right 04/09/2019   Procedure: INTRAMEDULLARY (IM) NAIL INTERTROCH;  Surgeon: Renette Butters, MD;  Location: [place];  Service: Orthopedics;  Laterality: Right;  . HARDWARE REMOVAL Right 05/09/2020   Procedure: HARDWARE REMOVAL RIGHT FEMUR;  Surgeon: Shona Needles, MD;  Location: [place];  Service: Orthopedics;  Laterality: Right;  . HERNIA REPAIR    . IR GENERIC HISTORICAL  05/15/2016   IR US GUIDE VASC ACCESS RIGHT 05/15/2016 Aletta Edouard, MD [facility]  . IR GENERIC HISTORICAL  05/15/2016   IR FLUORO GUIDE CV LINE RIGHT 05/15/2016 Aletta Edouard, MD [facility]  . IR REMOVAL TUN ACCESS W/  PORT W/O FL MOD SED  04/19/2018  . LAPAROSCOPIC CHOLECYSTECTOMY  2004  . LAPAROSCOPIC INCISIONAL / UMBILICAL / VENTRAL HERNIA REPAIR  2004   "w/gallbladder OR"  . LYMPH NODE BIOPSY N/A 03/26/2016   Procedure: [place] LYMPH NODE BIOPSY;  Surgeon: Everitt Amber, MD;  Location: [facility];  Service: Gynecology;  Laterality: N/A;  . ORIF FEMUR FRACTURE Right 05/09/2020   Procedure: OPEN REDUCTION INTERNAL FIXATION (ORIF) DISTAL FEMUR FRACTURE;  Surgeon: Shona Needles, MD;  Location: [place];  Service: Orthopedics;  Laterality: Right;  . REVERSE SHOULDER ARTHROPLASTY Right 10/26/2018   Procedure: REVERSE SHOULDER ARTHROPLASTY;  Surgeon: Nicholes Stairs, MD;  Location: [place];  Service: Orthopedics;  Laterality: Right;  . ROBOTIC ASSISTED TOTAL HYSTERECTOMY WITH BILATERAL SALPINGO OOPHERECTOMY N/A 03/26/2016   Procedure: XI ROBOTIC ASSISTED TOTAL Laproscopic HYSTERECTOMY WITH BILATERAL SALPINGO OOPHORECTOMY;  Surgeon: Everitt Amber, MD;  Location: [facility];  Service: Gynecology;  Laterality: N/A;  . TOTAL KNEE ARTHROPLASTY Left 10/09/2013   Procedure: LEFT TOTAL KNEE ARTHROPLASTY;  Surgeon: [name], MD;  Location: [facility];  Service: Orthopedics;  Laterality: Left;  . TOTAL KNEE ARTHROPLASTY Right 04/09/2014   Procedure: RIGHT TOTAL KNEE ARTHROPLASTY;  Surgeon: [name], MD;  Location: [facility];  Service: Orthopedics;  Laterality: Right;  . TUBAL LIGATION  1982  . VARICOSE VEIN SURGERY Bilateral 1968  . [place]     reports that she has never smoked. She has never used smokeless tobacco. She reports that she does not drink alcohol and does not use drugs. Social History  Allergies  Allergen Reactions  . Oxybutynin Other (See Comments)    Bleeding gums, felt sick    Family History  Problem Relation Age of Onset  . Heart disease Mother   . Heart disease Father   . CVA Father      Prior to Admission medications   Medication Sig Start Date End Date Taking? Authorizing  Provider  acetaminophen (TYLENOL) 325 MG tablet Take 2 tablets (650 mg total) by mouth every 6 (six) hours as needed for mild pain or moderate pain (pain score 1-3 or temp > 100.5). 04/13/19   Bonnell Public, MD  albuterol (VENTOLIN HFA) 108 (90 Base) MCG/ACT inhaler Inhale 2 puffs into the lungs every 6 (six) hours as needed for wheezing or shortness of breath.    [provider]  allopurinol (ZYLOPRIM) 300 MG tablet Take 300 mg by mouth daily.     [provider]  ARTIFICIAL TEAR OP Place 1 drop into both eyes in the morning, at noon, in the evening, and at bedtime.    [provider]  aspirin EC 81 MG tablet Take 81 mg by mouth daily. Swallow whole.    [provider]  baclofen (LIORESAL) 10 MG tablet Take 1 tablet (10 mg total) by mouth 3 (three) times daily as needed for muscle spasms.  Patient taking differently: Take 10 mg by mouth at bedtime.  04/12/19   Swayze, Ava, DO  calcium carbonate (TUMS - DOSED IN MG ELEMENTAL CALCIUM) 500 MG chewable tablet Chew 1 tablet by mouth daily.    [provider]  cetirizine (ZYRTEC) 10 MG tablet Take 10 mg by mouth daily.    [provider]  citalopram (CELEXA) 10 MG tablet Take 10 mg by mouth daily. 03/20/19   [provider]  docusate sodium (COLACE) 100 MG capsule Take 1 capsule (100 mg total) by mouth 2 (two) times daily. Patient not taking: Reported on 05/08/2020 05/16/19   [place] Donnamarie Poag, DO  enoxaparin (LOVENOX) 40 MG/0.4ML injection Inject 0.4 mLs (40 mg total) into the skin daily. 05/11/20 06/10/20  Delray Alt, PA-C  furosemide (LASIX) 40 MG tablet Take 0.5 tablets (20 mg total) by mouth daily. 05/11/20   Lavina Hamman, MD  HYDROcodone-acetaminophen (NORCO/VICODIN) 5-325 MG tablet Take 1 tablet by mouth every 6 (six) hours as needed for severe pain. 05/11/20   Delray Alt, PA-C  hydrOXYzine (ATARAX/VISTARIL) 25 MG tablet Take 25 mg by mouth at bedtime.    [provider]   iron polysaccharides (NIFEREX) 150 MG capsule Take 150 mg by mouth daily.    [provider]  levothyroxine (SYNTHROID, LEVOTHROID) 125 MCG tablet Take 125 mcg by mouth daily at 6 (six) AM.     [provider]  metoCLOPramide (REGLAN) 5 MG tablet Take 5 mg by mouth 4 (four) times daily.    [provider]  nystatin (NYSTATIN) powder Apply 1 Bottle topically See admin instructions. Apply topically to abdominal skin folds and under bilateral breast for intertrigo - twice daily - please make sure skin has been cleaned and is dry before applying    [provider]  Omega-3 Fatty Acids (FISH OIL) 1000 MG CAPS Take 1,000 mg by mouth daily.     [provider]  OXYGEN Inhale 2 L into the lungs continuous. To maintain O2 sat greater then 90% every shift    [provider]  pantoprazole (PROTONIX) 40 MG tablet Take 1 tablet (40 mg total) by mouth 2 (two) times daily before a meal. 11/17/18   Gherghe, Vella Redhead, MD  polyethylene glycol (MIRALAX / GLYCOLAX) packet Take 17 g by mouth daily as needed for mild constipation or moderate constipation. Patient not taking: Reported on 05/08/2020 04/12/14   Dara Lords, Alexzandrew L, PA-C  pravastatin (PRAVACHOL) 10 MG tablet Take 10 mg by mouth every Thursday. At bedtime 02/14/18   [provider]  pyridOXINE (VITAMIN B-6) 100 MG tablet Take 100 mg by mouth daily.    [provider]  saccharomyces boulardii (FLORASTOR) 250 MG capsule Take 250 mg by mouth 2 (two) times daily.    [provider]  senna-docusate (SENOKOT-S) 8.6-50 MG tablet Take 1 tablet by mouth daily.    [provider]  spironolactone (ALDACTONE) 25 MG tablet Take 25 mg by mouth daily.    [provider]  tolterodine (DETROL LA) 4 MG 24 hr capsule Take 4 mg by mouth every evening.     [provider]  traMADol (ULTRAM) 50 MG tablet Take 50 mg by mouth every 8 (eight) hours as needed for moderate pain.     [provider]  vitamin B-12 (CYANOCOBALAMIN) 1000 MCG tablet Take 1,000 mcg by mouth daily.     [provider]  vitamin C (ASCORBIC ACID) 500 MG tablet Take 500 mg by  mouth daily.    [provider]  Vitamin D, Ergocalciferol, (DRISDOL) 1.25 MG (50000 UT) CAPS capsule Take 50,000 Units by mouth every 7 (seven) days. Monday    [provider]  zolpidem (AMBIEN) 10 MG tablet Take 1 tablet (10 mg total) by mouth at bedtime. 05/11/20   Lavina Hamman, MD    Physical Exam: Vitals:   02/03/21 1800 02/03/21 1851 02/03/21 1859 02/03/21 2045  BP: 104/83  115/80 134/81  Pulse: 81  82 81  Resp: (!) 25  18 17   Temp:  98.7 F (37.1 C) 98.7 F (37.1 C) 97.7 F (36.5 C)  TempSrc:  Oral Oral Oral  SpO2: 96%  95% 95%  Weight:      Height:        Constitutional: NAD, calm, comfortable, elderly female laying at 20 degree incline in bed with bilateral upper extremity resting tremor Vitals:   02/03/21 1800 02/03/21 1851 02/03/21 1859 02/03/21 2045  BP: 104/83  115/80 134/81  Pulse: 81  82 81  Resp: (!) 25  18 17   Temp:  98.7 F (37.1 C) 98.7 F (37.1 C) 97.7 F (36.5 C)  TempSrc:  Oral Oral Oral  SpO2: 96%  95% 95%  Weight:      Height:       Eyes: PERRL, lids and conjunctivae normal ENMT: Mucous membranes are moist.  Neck: normal, supple Respiratory: clear to auscultation bilaterally, no wheezing, no crackles. Normal respiratory effort. No accessory muscle use.  Cardiovascular: Regular rate and rhythm, no murmurs / rubs / gallops. No extremity edema. 2+ pedal pulses.   Abdomen: no tenderness, no masses palpated.  Bowel sounds positive.  Musculoskeletal: no clubbing / cyanosis.  Normal muscle tone.  Left lower extremity shortened and internally rotated. Skin: no rashes, lesions, ulcers. No induration Neurologic: CN 2-12 grossly intact. Sensation intact.  Patient unable to lift left lower extremity due to intolerance to pain Psychiatric:  Alert and  oriented x 3. Normal mood.     Labs on Admission: I have personally reviewed following labs and imaging studies  CBC: Recent Labs  Lab 02/03/21 1635  WBC 9.0  NEUTROABS 6.6  HGB 15.1*  HCT 46.9*  MCV 96.1  PLT 034   Basic Metabolic Panel: Recent Labs  Lab 02/03/21 1635  NA 138  K 3.9  CL 98  CO2 27  GLUCOSE 191*  BUN 14  CREATININE 0.66  CALCIUM 10.2   GFR: Estimated Creatinine Clearance: 59.3 mL/min (by C-G formula based on SCr of 0.66 mg/dL). Liver Function Tests: No results for input(s): AST, ALT, ALKPHOS, BILITOT, PROT, ALBUMIN in the last 168 hours. No results for input(s): LIPASE, AMYLASE in the last 168 hours. No results for input(s): AMMONIA in the last 168 hours. Coagulation Profile: Recent Labs  Lab 02/03/21 1635  INR 1.0   Cardiac Enzymes: No results for input(s): CKTOTAL, CKMB, CKMBINDEX, TROPONINI in the last 168 hours. BNP (last 3 results) No results for input(s): PROBNP in the last 8760 hours. HbA1C: No results for input(s): HGBA1C in the last 72 hours. CBG: No results for input(s): GLUCAP in the last 168 hours. Lipid Profile: No results for input(s): CHOL, HDL, LDLCALC, TRIG, CHOLHDL, LDLDIRECT in the last 72 hours. Thyroid Function Tests: No results for input(s): TSH, T4TOTAL, FREET4, T3FREE, THYROIDAB in the last 72 hours. Anemia Panel: No results for input(s): VITAMINB12, FOLATE, FERRITIN, TIBC, IRON, RETICCTPCT in the last 72 hours. Urine analysis:    Component Value Date/Time   COLORURINE  COLORLESS (A) 05/14/2019 1331   APPEARANCEUR CLEAR 05/14/2019 1331   LABSPEC 1.018 05/14/2019 1331   LABSPEC 1.020 05/04/2016 1006   PHURINE 5.0 05/14/2019 1331   GLUCOSEU NEGATIVE 05/14/2019 1331   GLUCOSEU Negative 05/04/2016 1006   HGBUR NEGATIVE 05/14/2019 1331   BILIRUBINUR NEGATIVE 05/14/2019 1331   BILIRUBINUR Negative 05/04/2016 1006   KETONESUR NEGATIVE 05/14/2019 1331   PROTEINUR NEGATIVE 05/14/2019 1331   UROBILINOGEN 0.2  05/04/2016 1006   NITRITE NEGATIVE 05/14/2019 1331   LEUKOCYTESUR NEGATIVE 05/14/2019 1331   LEUKOCYTESUR Small 05/04/2016 1006    Radiological Exams on Admission: DG Hip Unilat With Pelvis 2-3 Views Left  Result Date: 02/03/2021 CLINICAL DATA:  Left hip pain. EXAM: DG HIP (WITH OR WITHOUT PELVIS) 2-3V LEFT COMPARISON:  None. FINDINGS: An acute fracture deformity is seen extending through the neck of the proximal left femur. Approximately 1/2 shaft width superior and lateral displacement of the distal fracture site is seen. There is no evidence of dislocation. A radiopaque intramedullary rod and compression screw device are seen within the proximal right femur. Degenerative changes are seen involving both hips in the form of joint space narrowing and acetabular sclerosis. Benign-appearing sclerosis is seen along the neck of the proximal right femur. Multiple phleboliths are noted within the pelvis. IMPRESSION: 1. Acute fracture of the proximal left femur. 2. Prior open reduction internal fixation of the proximal right femur. Electronically Signed   By: Virgina Norfolk M.D.   On: 02/03/2021 17:11      Assessment/Plan Left proximal femur fracture  -ortho to take to OR on Wed. Keep NPO on Tuesday at midnight .  -Hip fracture protocol -PRN pain management  Ligamentous injury of the left total knee arthroplasty - seen on CT femur  -appreciate ortho recommendations on whether MRI should be pursued   Prolonged QT -QTC of 530 - avoid QT prolongation meds  OSA continue CPAP  HTN -continue hypertensives once nursing home can be reached to help with med rec  Hx of CVA -continue daily aspirin   DVT prophylaxis:.SCDs Code Status: Full Family Communication: Plan discussed with patient at bedside and HCPOA on the phone disposition Plan: SNF with at least 2 midnight stays  Consults called:  Admission status: inpatient  Level of care: Med-Surg  Status is: Inpatient  Remains inpatient  appropriate because:Inpatient level of care appropriate due to severity of illness   Dispo: The patient is from: SNF              Anticipated d/c is to: SNF              Patient currently is not medically stable to d/c.   Difficult to place patient No         Orene Desanctis DO [facility]   If 7PM-7AM, please contact night-coverage [email or web address]   02/03/2021, 9:01 PM

## 2021-02-03 NOTE — ED Notes (Signed)
[place] [phone]

## 2021-02-03 NOTE — Progress Notes (Signed)
Pt refused nocturnal cpap tonight. Pt was encouraged to call, should she change her mind.  RN aware.

## 2021-02-03 NOTE — ED Notes (Signed)
Does the patient have any Covid s/s?  Curious since her test hasn't resulted.

## 2021-02-03 NOTE — ED Triage Notes (Signed)
Patient BIB [facility] from Orthopedic MD for possible left hip fracture. Patietn was transported by Corey Harold to MD office for L Hip pain. Orthopedic MD sent to ED for Hip fracture.Pt reports that she fell on floor from a lift chair

## 2021-02-03 NOTE — Plan of Care (Signed)
Plan of care initiated and discussed with the patient. 

## 2021-02-04 ENCOUNTER — Inpatient Hospital Stay (HOSPITAL_COMMUNITY): Payer: Medicare Other

## 2021-02-04 DIAGNOSIS — R9431 Abnormal electrocardiogram [ECG] [EKG]: Secondary | ICD-10-CM

## 2021-02-04 DIAGNOSIS — Z8673 Personal history of transient ischemic attack (TIA), and cerebral infarction without residual deficits: Secondary | ICD-10-CM

## 2021-02-04 DIAGNOSIS — I4891 Unspecified atrial fibrillation: Secondary | ICD-10-CM

## 2021-02-04 LAB — BASIC METABOLIC PANEL
Anion gap: 6 (ref 5–15)
BUN: 14 mg/dL (ref 8–23)
CO2: 33 mmol/L — ABNORMAL HIGH (ref 22–32)
Calcium: 9.8 mg/dL (ref 8.9–10.3)
Chloride: 100 mmol/L (ref 98–111)
Creatinine, Ser: 0.7 mg/dL (ref 0.44–1.00)
GFR, Estimated: >60 mL/min (ref >60)
Glucose, Bld: 158 mg/dL — ABNORMAL HIGH (ref 70–99)
Potassium: 3.5 mmol/L (ref 3.5–5.1)
Sodium: 139 mmol/L (ref 135–145)

## 2021-02-04 LAB — CBC WITH DIFFERENTIAL/PLATELET
Abs Immature Granulocytes: 0.04 10*3/uL (ref 0.00–0.07)
Basophils Absolute: 0.1 10*3/uL (ref 0.0–0.1)
Basophils Relative: 1 %
Eosinophils Absolute: 0.3 10*3/uL (ref 0.0–0.5)
Eosinophils Relative: 3 %
HCT: 44.6 % (ref 36.0–46.0)
Hemoglobin: 13.9 g/dL (ref 12.0–15.0)
Immature Granulocytes: 0 %
Lymphocytes Relative: 25 %
Lymphs Abs: 2.3 10*3/uL (ref 0.7–4.0)
MCH: 31 pg (ref 26.0–34.0)
MCHC: 31.2 g/dL (ref 30.0–36.0)
MCV: 99.6 fL (ref 80.0–100.0)
Monocytes Absolute: 1.1 10*3/uL — ABNORMAL HIGH (ref 0.1–1.0)
Monocytes Relative: 12 %
Neutro Abs: 5.3 10*3/uL (ref 1.7–7.7)
Neutrophils Relative %: 59 %
Platelets: 311 10*3/uL (ref 150–400)
RBC: 4.48 MIL/uL (ref 3.87–5.11)
RDW: 13.6 % (ref 11.5–15.5)
WBC: 9.1 10*3/uL (ref 4.0–10.5)
nRBC: 0 % (ref 0.0–0.2)

## 2021-02-04 LAB — ECHOCARDIOGRAM COMPLETE
Area-P 1/2: 1.34 cm2
Height: 60 in
S' Lateral: 2.4 cm
Weight: 3920 oz

## 2021-02-04 LAB — TSH: TSH: 2.473 u[IU]/mL (ref 0.350–4.500)

## 2021-02-04 LAB — SARS CORONAVIRUS 2 (TAT 6-24 HRS): SARS Coronavirus 2: NEGATIVE

## 2021-02-04 MED ORDER — ALBUTEROL SULFATE HFA 108 (90 BASE) MCG/ACT IN AERS: 2.0000 | INHALATION_SPRAY | Freq: Three times a day (TID) | RESPIRATORY_TRACT | Status: DC | PRN

## 2021-02-04 MED ORDER — ENSURE PRE-SURGERY PO LIQD
296.0000 mL | Freq: Once | ORAL | Status: AC
  Administered 2021-02-05: 296 mL via ORAL
  Filled 2021-02-04: qty 296

## 2021-02-04 MED ORDER — SACCHAROMYCES BOULARDII 250 MG PO CAPS
250.0000 mg | ORAL_CAPSULE | Freq: Two times a day (BID) | ORAL | Status: DC
  Administered 2021-02-04 – 2021-02-07 (×6): 250 mg via ORAL
  Filled 2021-02-04 (×5): qty 1

## 2021-02-04 MED ORDER — HYDROXYZINE HCL 25 MG PO TABS
25.0000 mg | ORAL_TABLET | Freq: Every day | ORAL | Status: DC
  Administered 2021-02-04 – 2021-02-06 (×3): 25 mg via ORAL
  Filled 2021-02-04 (×3): qty 1

## 2021-02-04 MED ORDER — BACLOFEN 10 MG PO TABS
10.0000 mg | ORAL_TABLET | Freq: Every day | ORAL | Status: DC
  Administered 2021-02-04 – 2021-02-06 (×3): 10 mg via ORAL
  Filled 2021-02-04 (×3): qty 1

## 2021-02-04 MED ORDER — OXYCODONE HCL 5 MG PO TABS: 2.5000 mg | ORAL_TABLET | ORAL | Status: DC | PRN

## 2021-02-04 MED ORDER — PRAVASTATIN SODIUM 20 MG PO TABS
10.0000 mg | ORAL_TABLET | ORAL | Status: DC
  Administered 2021-02-06: 10 mg via ORAL
  Filled 2021-02-04: qty 1

## 2021-02-04 MED ORDER — ACETAMINOPHEN 500 MG PO TABS
1000.0000 mg | ORAL_TABLET | Freq: Three times a day (TID) | ORAL | Status: DC
  Administered 2021-02-04 – 2021-02-07 (×7): 1000 mg via ORAL
  Filled 2021-02-04 (×7): qty 2

## 2021-02-04 MED ORDER — OXYCODONE HCL 5 MG PO TABS
5.0000 mg | ORAL_TABLET | ORAL | Status: DC | PRN
  Administered 2021-02-06 – 2021-02-07 (×2): 5 mg via ORAL
  Filled 2021-02-04 (×2): qty 1

## 2021-02-04 MED ORDER — PANTOPRAZOLE SODIUM 40 MG PO TBEC
40.0000 mg | DELAYED_RELEASE_TABLET | Freq: Two times a day (BID) | ORAL | Status: DC
  Administered 2021-02-04 – 2021-02-07 (×6): 40 mg via ORAL
  Filled 2021-02-04 (×6): qty 1

## 2021-02-04 MED ORDER — SENNOSIDES-DOCUSATE SODIUM 8.6-50 MG PO TABS: 1.0000 | ORAL_TABLET | Freq: Every day | ORAL | Status: DC

## 2021-02-04 MED ORDER — ZOLPIDEM TARTRATE 5 MG PO TABS
2.5000 mg | ORAL_TABLET | Freq: Every day | ORAL | Status: DC
  Administered 2021-02-04 – 2021-02-06 (×2): 2.5 mg via ORAL
  Filled 2021-02-04 (×2): qty 1

## 2021-02-04 MED ORDER — ALLOPURINOL 300 MG PO TABS
300.0000 mg | ORAL_TABLET | Freq: Every day | ORAL | Status: DC
  Administered 2021-02-04 – 2021-02-07 (×4): 300 mg via ORAL
  Filled 2021-02-04 (×4): qty 1

## 2021-02-04 MED ORDER — VITAMIN B-6 100 MG PO TABS
100.0000 mg | ORAL_TABLET | Freq: Every day | ORAL | Status: DC
  Administered 2021-02-05 – 2021-02-07 (×3): 100 mg via ORAL
  Filled 2021-02-04 (×3): qty 1

## 2021-02-04 MED ORDER — VITAMIN B-12 1000 MCG PO TABS
1000.0000 ug | ORAL_TABLET | Freq: Every day | ORAL | Status: DC
  Administered 2021-02-05 – 2021-02-07 (×3): 1000 ug via ORAL
  Filled 2021-02-04 (×3): qty 1

## 2021-02-04 MED ORDER — CITALOPRAM HYDROBROMIDE 20 MG PO TABS
10.0000 mg | ORAL_TABLET | Freq: Every day | ORAL | Status: DC
  Administered 2021-02-04 – 2021-02-07 (×4): 10 mg via ORAL
  Filled 2021-02-04 (×4): qty 1

## 2021-02-04 MED ORDER — LEVOTHYROXINE SODIUM 25 MCG PO TABS
125.0000 ug | ORAL_TABLET | Freq: Every day | ORAL | Status: DC
  Administered 2021-02-05 – 2021-02-07 (×3): 125 ug via ORAL
  Filled 2021-02-04 (×3): qty 1

## 2021-02-04 MED ORDER — ONDANSETRON 4 MG PO TBDP
4.0000 mg | ORAL_TABLET | Freq: Three times a day (TID) | ORAL | Status: DC | PRN
  Administered 2021-02-04: 4 mg via ORAL
  Filled 2021-02-04: qty 1

## 2021-02-04 MED ORDER — SIMETHICONE 80 MG PO CHEW: 160.0000 mg | CHEWABLE_TABLET | Freq: Three times a day (TID) | ORAL | Status: DC | PRN

## 2021-02-04 MED ORDER — ASPIRIN EC 81 MG PO TBEC
81.0000 mg | DELAYED_RELEASE_TABLET | Freq: Every day | ORAL | Status: DC
  Administered 2021-02-04: 81 mg via ORAL
  Filled 2021-02-04: qty 1

## 2021-02-04 NOTE — Progress Notes (Signed)
Patient has subacute L femoral neck fracture. Has been a nonambulator for about 2 years. Has intractable pain with transfers and ADLS, Discussed L hip hemi with patient and family. Plan for surgery tomorrow. NPO after MN. Hold chemical DVT ppx.

## 2021-02-04 NOTE — Progress Notes (Signed)
Pt refused CPAP qhs.  Pt states she does not use hers at home and does not want to use one while in the hospital.

## 2021-02-04 NOTE — Progress Notes (Signed)
  Echocardiogram 2D Echocardiogram has been performed.  Melinda Kerr 02/04/2021, 10:57 AM

## 2021-02-04 NOTE — Progress Notes (Addendum)
PROGRESS NOTE    WILLIS KUIPERS  DDU:[id number] DOB: 1935-03-25 DOA: 02/03/2021 PCP: Maury Dus, MD    Chief Complaint  Patient presents with  . Hip Pain  . Fall    Brief Narrative:  RAYNELL UPTON is Sabrea Sankey 86 y.o. female with medical history significant for hypertension, CAD, CVA, OSA on CPAP, hyperlipidemia, endometrial cancer in remission, s/p left total knee replacement, history of DVT who presents for mechanical fall (5-6 weeks ago).  Patient was sitting in her power lift chair today and lowered it too much to where she slid out and fell on her left hip.  Review, she had Christna Kulick prior CT of the left hip on 01/27/2021. Pt unsure who ordered this. I spoke with her HCPOA [name] yow on the phone and she says that pt was sent to [facility] after having persistent pain for Laverne Hursey fall several weeks ago. However I do not see any documentation of an ER visit.  Assessment & Plan:   Principal Problem:   Femur fracture, left (HCC) Active Problems:   OSA on CPAP   Essential hypertension   Prolonged QT interval   History of CVA (cerebrovascular accident)  Left proximal femur fracture  - x ray with acute fx of proximal L femur -ortho to take to OR on Wed. Keep NPO on Tuesday at midnight .  -scheduled APAP, oxycodone, morphine prn -post op dvt ppx per ortho -she's nonambulatory at baseline, but is having intractable pain  Ligamentous injury of the left total knee arthroplasty - seen on CT femur  - defer to orthopedics additional w/u and treatment  EKG with question of atrial fibrillation or atrial flutter She has tremor on exam, discussed with cardiology who reviewed echo and noted with normal E/Hisao Doo waves, c/w normal sinus rhythm Repeat EKG while holding arms still if possible Echo as below (normal EF, grade 2 diastolic dysfunction)   Prolonged QT -QTC of 530 - improved on EKG today - follow on telemetry - avoid QT prolongation meds - Repeat EKG 5/11  Gout -  allopurinol  Depession celexa (QT improved today, follow to make sure she continues to tolerate)  Insomnia Low dose ambien  Hypothyroid - synthroid   OSA continue CPAP  HTN  HFpEF -holding lasix, spironolactone  Hx of CVA -holding aspirin for now  GERD - PPI  ?Muscle spasms - on baclofen nightly, presume this is for spasms?  Hold reglan for now with prolonged QT, follow and consider resumption  DVT prophylaxis: SCD Code Status: DNR Family Communication: Kathrynn Humble, niece Disposition:   Status is: Inpatient  Remains inpatient appropriate because:Inpatient level of care appropriate due to severity of illness   Dispo: The patient is from: Home              Anticipated d/c is to: Home              Patient currently is not medically stable to d/c.   Difficult to place patient No       Consultants:   orthopedics  Procedures:  Echo IMPRESSIONS    1. Left ventricular ejection fraction, by estimation, is 60 to 65%. The  left ventricle has normal function. The left ventricle has no regional  wall motion abnormalities. Left ventricular diastolic parameters are  consistent with Grade II diastolic  dysfunction (pseudonormalization).  2. Right ventricular systolic function is normal. The right ventricular  size is normal. There is mildly elevated pulmonary artery systolic  pressure.  3. Left atrial size  was mildly dilated.  4. The mitral valve is normal in structure. No evidence of mitral valve  regurgitation. No evidence of mitral stenosis.  5. The aortic valve is tricuspid. Aortic valve regurgitation is not  visualized. Mild aortic valve sclerosis is present, with no evidence of  aortic valve stenosis.  6. The inferior vena cava is normal in size with greater than 50%  respiratory variability, suggesting right atrial pressure of 3 mmHg.   Antimicrobials: Anti-infectives (From admission, onward)   None         Subjective: Pain is improved    Objective: Vitals:   02/04/21 0154 02/04/21 0610 02/04/21 1128 02/04/21 1502  BP: 125/66 128/79 113/72 117/75  Pulse: 67 67 72 71  Resp: 16 16 16 16   Temp: 97.7 F (36.5 C) 98.3 F (36.8 C) 98.3 F (36.8 C) 98.4 F (36.9 C)  TempSrc: Oral  Oral Oral  SpO2: 96% 97% 97% 98%  Weight:   110.1 kg   Height:        Intake/Output Summary (Last 24 hours) at 02/04/2021 1734 Last data filed at 02/04/2021 0616 Gross per 24 hour  Intake 740.18 ml  Output 100 ml  Net 640.18 ml   Filed Weights   02/03/21 1615 02/04/21 1128  Weight: 111.1 kg 110.1 kg    Examination:  General exam: Appears calm and comfortable  Respiratory system: Clear to auscultation. Respiratory effort normal. Cardiovascular system: S1 & S2 heard, RRR. No JVD, murmurs, rubs, gallops or clicks. No pedal edema. Gastrointestinal system: Abdomen is nondistended, soft and nontender Central nervous system: Alert and oriented x3. No focal neurological deficits. Extremities: LLE shortened and externally rotated Skin: No rashes, lesions or ulcers Psychiatry: Judgement and insight appear normal. Mood & affect appropriate.     Data Reviewed: I have personally reviewed following labs and imaging studies  CBC: Recent Labs  Lab 02/03/21 1635 02/04/21 0335  WBC 9.0 9.1  NEUTROABS 6.6 5.3  HGB 15.1* 13.9  HCT 46.9* 44.6  MCV 96.1 99.6  PLT 359 382    Basic Metabolic Panel: Recent Labs  Lab 02/03/21 1635 02/04/21 0335  NA 138 139  K 3.9 3.5  CL 98 100  CO2 27 33*  GLUCOSE 191* 158*  BUN 14 14  CREATININE 0.66 0.70  CALCIUM 10.2 9.8    GFR: Estimated Creatinine Clearance: 58.9 mL/min (by C-G formula based on SCr of 0.7 mg/dL).  Liver Function Tests: No results for input(s): AST, ALT, ALKPHOS, BILITOT, PROT, ALBUMIN in the last 168 hours.  CBG: No results for input(s): GLUCAP in the last 168 hours.   Recent Results (from the past 240 hour(s))  SARS CORONAVIRUS 2 (TAT 6-24 HRS) Nasopharyngeal  Nasopharyngeal Swab     Status: None   Collection Time: 02/03/21  6:56 PM   Specimen: Nasopharyngeal Swab  Result Value Ref Range Status   SARS Coronavirus 2 NEGATIVE NEGATIVE Final    Comment: (NOTE) SARS-CoV-2 target nucleic acids are NOT DETECTED.  The SARS-CoV-2 RNA is generally detectable in upper and lower respiratory specimens during the acute phase of infection. Negative results do not preclude SARS-CoV-2 infection, do not rule out co-infections with other pathogens, and should not be used as the sole basis for treatment or other patient management decisions. Negative results must be combined with clinical observations, patient history, and epidemiological information. The expected result is Negative.  Fact Sheet for Patients: SugarRoll.be  Fact Sheet for Healthcare Providers: https://www.woods-mathews.com/  This test is not yet approved or cleared  by the [place] and  has been authorized for detection and/or diagnosis of SARS-CoV-2 by FDA under an Emergency Use Authorization (EUA). This EUA will remain  in effect (meaning this test can be used) for the duration of the COVID-19 declaration under Se ction 564(b)(1) of the Act, 21 U.S.C. section 360bbb-3(b)(1), unless the authorization is terminated or revoked sooner.  Performed at [facility], [place] [address] [place], [place] [address]   Surgical pcr screen     Status: None   Collection Time: 02/03/21  8:43 PM   Specimen: Nasal Mucosa; Nasal Swab  Result Value Ref Range Status   MRSA, PCR NEGATIVE NEGATIVE Final   Staphylococcus aureus NEGATIVE NEGATIVE Final    Comment: (NOTE) The Xpert SA Assay (FDA approved for NASAL specimens in patients 20 years of age and older), is one component of Dakia Schifano comprehensive surveillance program. It is not intended to diagnose infection nor to guide or monitor treatment. Performed at [facility], [place]  [address] [place], [place] [address]          Radiology Studies: ECHOCARDIOGRAM COMPLETE  Result Date: 02/04/2021    ECHOCARDIOGRAM REPORT   Patient Name:   KIMBERLLY NORGARD Date of Exam: 02/04/2021 Medical Rec #:  [id number]       Height:       60.0 in Accession #:    [id number]      Weight:       245.0 lb Date of Birth:  09-17-1935        BSA:          2.035 m Patient Age:    60 years        BP:           128/79 mmHg Patient Gender: F               HR:           71 bpm. Exam Location:  Inpatient Procedure: 2D Echo, Color Doppler and Cardiac Doppler Indications:    I48.91* Unspeicified atrial fibrillation  History:        Patient has prior history of Echocardiogram examinations, most                 recent 05/15/2019. CHF, Signs/Symptoms:Dyspnea; Risk                 Factors:Diabetes, Hypertension and Dyslipidemia.  Sonographer:    Bernadene Person RDCS Referring Phys: [phone] Naly Schwanz CALDWELL [place]  1. Left ventricular ejection fraction, by estimation, is 60 to 65%. The left ventricle has normal function. The left ventricle has no regional wall motion abnormalities. Left ventricular diastolic parameters are consistent with Grade II diastolic dysfunction (pseudonormalization).  2. Right ventricular systolic function is normal. The right ventricular size is normal. There is mildly elevated pulmonary artery systolic pressure.  3. Left atrial size was mildly dilated.  4. The mitral valve is normal in structure. No evidence of mitral valve regurgitation. No evidence of mitral stenosis.  5. The aortic valve is tricuspid. Aortic valve regurgitation is not visualized. Mild aortic valve sclerosis is present, with no evidence of aortic valve stenosis.  6. The inferior vena cava is normal in size with greater than 50% respiratory variability, suggesting right atrial pressure of 3 mmHg. FINDINGS  Left Ventricle: Left ventricular ejection fraction, by estimation, is 60 to 65%. The left ventricle has normal  function. The left ventricle has no regional wall motion abnormalities. The left  ventricular internal cavity size was normal in size. There is  no left ventricular hypertrophy. Left ventricular diastolic parameters are consistent with Grade II diastolic dysfunction (pseudonormalization). Right Ventricle: The right ventricular size is normal. No increase in right ventricular wall thickness. Right ventricular systolic function is normal. There is mildly elevated pulmonary artery systolic pressure. The tricuspid regurgitant velocity is 2.72  m/s, and with an assumed right atrial pressure of 5 mmHg, the estimated right ventricular systolic pressure is [id number] mmHg. Left Atrium: Left atrial size was mildly dilated. Right Atrium: Right atrial size was normal in size. Pericardium: There is no evidence of pericardial effusion. Presence of pericardial fat pad. Mitral Valve: The mitral valve is normal in structure. Mild mitral annular calcification. No evidence of mitral valve regurgitation. No evidence of mitral valve stenosis. Tricuspid Valve: The tricuspid valve is normal in structure. Tricuspid valve regurgitation is not demonstrated. No evidence of tricuspid stenosis. Aortic Valve: The aortic valve is tricuspid. Aortic valve regurgitation is not visualized. Mild aortic valve sclerosis is present, with no evidence of aortic valve stenosis. Pulmonic Valve: The pulmonic valve was normal in structure. Pulmonic valve regurgitation is not visualized. No evidence of pulmonic stenosis. Aorta: The aortic root is normal in size and structure. Venous: The inferior vena cava was not well visualized. The inferior vena cava is normal in size with greater than 50% respiratory variability, suggesting right atrial pressure of 3 mmHg. IAS/Shunts: No atrial level shunt detected by color flow Doppler.  LEFT VENTRICLE PLAX 2D LVIDd:         3.80 cm  Diastology LVIDs:         2.40 cm  LV e' medial:    5.52 cm/s LV PW:         0.90 cm  LV E/e'  medial:  16.2 LV IVS:        1.00 cm  LV e' lateral:   7.71 cm/s LVOT diam:     1.90 cm  LV E/e' lateral: 11.6 LV SV:         71 LV SV Index:   35 LVOT Area:     2.84 cm  RIGHT VENTRICLE RV S prime:     13.80 cm/s TAPSE (M-mode): 2.1 cm LEFT ATRIUM             Index       RIGHT ATRIUM           Index LA diam:        4.16 cm 2.04 cm/m  RA Area:     14.60 cm LA Vol (A2C):   55.4 ml 27.23 ml/m RA Volume:   36.00 ml  17.69 ml/m LA Vol (A4C):   50.1 ml 24.62 ml/m LA Biplane Vol: 54.4 ml 26.74 ml/m  AORTIC VALVE LVOT Vmax:   104.00 cm/s LVOT Vmean:  82.500 cm/s LVOT VTI:    0.252 m  AORTA Ao Root diam: 3.30 cm Ao Asc diam:  3.30 cm MITRAL VALVE               TRICUSPID VALVE MV Area (PHT): 1.34 cm    TR Peak grad:   29.6 mmHg MV Decel Time: 567 msec    TR Vmax:        272.00 cm/s MV E velocity: 89.70 cm/s MV Ivelise Castillo velocity: 78.20 cm/s  SHUNTS MV E/Charlynn Salih ratio:  1.15        Systemic VTI:  0.25 m  Systemic Diam: 1.90 cm Sanda Klein MD Electronically signed by Sanda Klein MD Signature Date/Time: 02/04/2021/11:29:07 AM    Final    DG Hip Unilat With Pelvis 2-3 Views Left  Result Date: 02/03/2021 CLINICAL DATA:  Left hip pain. EXAM: DG HIP (WITH OR WITHOUT PELVIS) 2-3V LEFT COMPARISON:  None. FINDINGS: An acute fracture deformity is seen extending through the neck of the proximal left femur. Approximately 1/2 shaft width superior and lateral displacement of the distal fracture site is seen. There is no evidence of dislocation. Delmore Sear radiopaque intramedullary rod and compression screw device are seen within the proximal right femur. Degenerative changes are seen involving both hips in the form of joint space narrowing and acetabular sclerosis. Benign-appearing sclerosis is seen along the neck of the proximal right femur. Multiple phleboliths are noted within the pelvis. IMPRESSION: 1. Acute fracture of the proximal left femur. 2. Prior open reduction internal fixation of the proximal right femur.  Electronically Signed   By: Virgina Norfolk M.D.   On: 02/03/2021 17:11        Scheduled Meds: . aspirin EC  81 mg Oral Daily  . senna  1 tablet Oral BID   Continuous Infusions:   LOS: 1 day    Time spent: over 30 min    Fayrene Helper, MD [facility]   To contact the attending provider between 7A-7P or the covering provider during after hours 7P-7A, please log into the web site [email or web address] and access using universal [place] password for that web site. If you do not have the password, please call the hospital operator.  02/04/2021, 5:34 PM

## 2021-02-05 ENCOUNTER — Inpatient Hospital Stay (HOSPITAL_COMMUNITY): Payer: Medicare Other | Admitting: Anesthesiology

## 2021-02-05 ENCOUNTER — Encounter (HOSPITAL_COMMUNITY)
Admission: EM | Disposition: A | Discharge status: Skilled Nursing Facility | Payer: Self-pay | Source: Ambulatory Visit | Attending: Family Medicine

## 2021-02-05 ENCOUNTER — Encounter (HOSPITAL_COMMUNITY): Payer: Self-pay | Admitting: Family Medicine

## 2021-02-05 ENCOUNTER — Inpatient Hospital Stay (HOSPITAL_COMMUNITY): Admission: RE | Admit: 2021-02-05 | Payer: Medicare Other | Source: Home / Self Care | Admitting: Orthopedic Surgery

## 2021-02-05 ENCOUNTER — Inpatient Hospital Stay (HOSPITAL_COMMUNITY): Payer: Medicare Other

## 2021-02-05 HISTORY — PX: ANTERIOR APPROACH HEMI HIP ARTHROPLASTY: SHX6690

## 2021-02-05 LAB — COMPREHENSIVE METABOLIC PANEL
ALT: 18 U/L (ref 0–44)
AST: 24 U/L (ref 15–41)
Albumin: 2.9 g/dL — ABNORMAL LOW (ref 3.5–5.0)
Alkaline Phosphatase: 102 U/L (ref 38–126)
Anion gap: 7 (ref 5–15)
BUN: 19 mg/dL (ref 8–23)
CO2: 30 mmol/L (ref 22–32)
Calcium: 9.8 mg/dL (ref 8.9–10.3)
Chloride: 100 mmol/L (ref 98–111)
Creatinine, Ser: 0.6 mg/dL (ref 0.44–1.00)
GFR, Estimated: >60 mL/min (ref >60)
Glucose, Bld: 233 mg/dL — ABNORMAL HIGH (ref 70–99)
Potassium: 3.5 mmol/L (ref 3.5–5.1)
Sodium: 137 mmol/L (ref 135–145)
Total Bilirubin: 0.5 mg/dL (ref 0.3–1.2)
Total Protein: 5.3 g/dL — ABNORMAL LOW (ref 6.5–8.1)

## 2021-02-05 LAB — CBC WITH DIFFERENTIAL/PLATELET
Abs Immature Granulocytes: 0.01 10*3/uL (ref 0.00–0.07)
Basophils Absolute: 0 10*3/uL (ref 0.0–0.1)
Basophils Relative: 1 %
Eosinophils Absolute: 0 10*3/uL (ref 0.0–0.5)
Eosinophils Relative: 0 %
HCT: 39.6 % (ref 36.0–46.0)
Hemoglobin: 12.7 g/dL (ref 12.0–15.0)
Immature Granulocytes: 0 %
Lymphocytes Relative: 23 %
Lymphs Abs: 1.7 10*3/uL (ref 0.7–4.0)
MCH: 31.5 pg (ref 26.0–34.0)
MCHC: 32.1 g/dL (ref 30.0–36.0)
MCV: 98.3 fL (ref 80.0–100.0)
Monocytes Absolute: 0.9 10*3/uL (ref 0.1–1.0)
Monocytes Relative: 11 %
Neutro Abs: 5 10*3/uL (ref 1.7–7.7)
Neutrophils Relative %: 65 %
Platelets: 312 10*3/uL (ref 150–400)
RBC: 4.03 MIL/uL (ref 3.87–5.11)
RDW: 13.3 % (ref 11.5–15.5)
WBC: 7.7 10*3/uL (ref 4.0–10.5)
nRBC: 0 % (ref 0.0–0.2)

## 2021-02-05 LAB — MAGNESIUM: Magnesium: 1.6 mg/dL — ABNORMAL LOW (ref 1.7–2.4)

## 2021-02-05 LAB — GLUCOSE, CAPILLARY: Glucose-Capillary: 182 mg/dL — ABNORMAL HIGH (ref 70–99)

## 2021-02-05 LAB — PHOSPHORUS: Phosphorus: 3.6 mg/dL (ref 2.5–4.6)

## 2021-02-05 SURGERY — HEMIARTHROPLASTY, HIP, DIRECT ANTERIOR APPROACH, FOR FRACTURE
Anesthesia: General | Laterality: Left

## 2021-02-05 MED ORDER — BUPIVACAINE-EPINEPHRINE (PF) 0.25% -1:200000 IJ SOLN
INTRAMUSCULAR | Status: AC
  Filled 2021-02-05: qty 30

## 2021-02-05 MED ORDER — FENTANYL CITRATE (PF) 100 MCG/2ML IJ SOLN
INTRAMUSCULAR | Status: AC
  Filled 2021-02-05: qty 2

## 2021-02-05 MED ORDER — VANCOMYCIN HCL 1500 MG/300ML IV SOLN
1500.0000 mg | INTRAVENOUS | Status: AC
  Administered 2021-02-05: 1500 mg via INTRAVENOUS
  Filled 2021-02-05: qty 300

## 2021-02-05 MED ORDER — SUGAMMADEX SODIUM 200 MG/2ML IV SOLN
INTRAVENOUS | Status: DC | PRN
  Administered 2021-02-05: 200 mg via INTRAVENOUS

## 2021-02-05 MED ORDER — POVIDONE-IODINE 10 % EX SWAB: 2.0000 "application " | Freq: Once | CUTANEOUS | Status: DC

## 2021-02-05 MED ORDER — CHLORHEXIDINE GLUCONATE CLOTH 2 % EX PADS
6.0000 | MEDICATED_PAD | Freq: Every day | CUTANEOUS | Status: DC
  Administered 2021-02-06: 6 via TOPICAL

## 2021-02-05 MED ORDER — PROPOFOL 10 MG/ML IV BOLUS
INTRAVENOUS | Status: AC
  Filled 2021-02-05: qty 20

## 2021-02-05 MED ORDER — POVIDONE-IODINE 10 % EX SWAB
2.0000 "application " | Freq: Once | CUTANEOUS | Status: AC
  Administered 2021-02-05: 2 via TOPICAL

## 2021-02-05 MED ORDER — ACETAMINOPHEN 10 MG/ML IV SOLN
INTRAVENOUS | Status: AC
  Filled 2021-02-05: qty 100

## 2021-02-05 MED ORDER — LACTATED RINGERS IV SOLN: INTRAVENOUS | Status: DC

## 2021-02-05 MED ORDER — MAGNESIUM SULFATE 2 GM/50ML IV SOLN
2.0000 g | Freq: Once | INTRAVENOUS | Status: AC
  Administered 2021-02-05: 2 g via INTRAVENOUS
  Filled 2021-02-05: qty 50

## 2021-02-05 MED ORDER — KETOROLAC TROMETHAMINE 30 MG/ML IJ SOLN
INTRAMUSCULAR | Status: AC
  Filled 2021-02-05: qty 1

## 2021-02-05 MED ORDER — FENTANYL CITRATE (PF) 100 MCG/2ML IJ SOLN
25.0000 ug | INTRAMUSCULAR | Status: DC | PRN
  Administered 2021-02-05 (×2): 25 ug via INTRAVENOUS
  Administered 2021-02-05: 50 ug via INTRAVENOUS

## 2021-02-05 MED ORDER — ONDANSETRON HCL 4 MG/2ML IJ SOLN
INTRAMUSCULAR | Status: DC | PRN
  Administered 2021-02-05: 4 mg via INTRAVENOUS

## 2021-02-05 MED ORDER — PROPOFOL 10 MG/ML IV BOLUS
INTRAVENOUS | Status: DC | PRN
  Administered 2021-02-05: 90 mg via INTRAVENOUS

## 2021-02-05 MED ORDER — SODIUM CHLORIDE (PF) 0.9 % IJ SOLN
INTRAMUSCULAR | Status: DC | PRN
  Administered 2021-02-05: 30 mL

## 2021-02-05 MED ORDER — FENTANYL CITRATE (PF) 250 MCG/5ML IJ SOLN
INTRAMUSCULAR | Status: DC | PRN
  Administered 2021-02-05 (×2): 50 ug via INTRAVENOUS
  Administered 2021-02-05: 100 ug via INTRAVENOUS
  Administered 2021-02-05: 50 ug via INTRAVENOUS

## 2021-02-05 MED ORDER — PHENYLEPHRINE HCL-NACL 10-0.9 MG/250ML-% IV SOLN
INTRAVENOUS | Status: DC | PRN
  Administered 2021-02-05: 40 ug/min via INTRAVENOUS

## 2021-02-05 MED ORDER — FENTANYL CITRATE (PF) 250 MCG/5ML IJ SOLN
INTRAMUSCULAR | Status: AC
  Filled 2021-02-05: qty 5

## 2021-02-05 MED ORDER — SODIUM CHLORIDE (PF) 0.9 % IJ SOLN
INTRAMUSCULAR | Status: AC
  Filled 2021-02-05: qty 30

## 2021-02-05 MED ORDER — OXYCODONE HCL 5 MG PO TABS: 5.0000 mg | ORAL_TABLET | Freq: Once | ORAL | Status: DC | PRN

## 2021-02-05 MED ORDER — BUPIVACAINE-EPINEPHRINE (PF) 0.5% -1:200000 IJ SOLN
INTRAMUSCULAR | Status: AC
  Filled 2021-02-05: qty 30

## 2021-02-05 MED ORDER — POTASSIUM CHLORIDE CRYS ER 20 MEQ PO TBCR
40.0000 meq | EXTENDED_RELEASE_TABLET | Freq: Once | ORAL | Status: DC
  Filled 2021-02-05: qty 2

## 2021-02-05 MED ORDER — CHLORHEXIDINE GLUCONATE 4 % EX LIQD
60.0000 mL | Freq: Once | CUTANEOUS | Status: DC
  Filled 2021-02-05: qty 60

## 2021-02-05 MED ORDER — ROCURONIUM BROMIDE 10 MG/ML (PF) SYRINGE
PREFILLED_SYRINGE | INTRAVENOUS | Status: DC | PRN
  Administered 2021-02-05: 50 mg via INTRAVENOUS

## 2021-02-05 MED ORDER — TRANEXAMIC ACID-NACL 1000-0.7 MG/100ML-% IV SOLN
1000.0000 mg | INTRAVENOUS | Status: AC
  Administered 2021-02-05: 1000 mg via INTRAVENOUS
  Filled 2021-02-05: qty 100

## 2021-02-05 MED ORDER — CHLORHEXIDINE GLUCONATE 0.12 % MT SOLN
15.0000 mL | Freq: Once | OROMUCOSAL | Status: AC
  Administered 2021-02-05: 15 mL via OROMUCOSAL

## 2021-02-05 MED ORDER — ACETAMINOPHEN 10 MG/ML IV SOLN
1000.0000 mg | Freq: Once | INTRAVENOUS | Status: DC | PRN
  Administered 2021-02-05: 1000 mg via INTRAVENOUS

## 2021-02-05 MED ORDER — OXYCODONE HCL 5 MG/5ML PO SOLN: 5.0000 mg | Freq: Once | ORAL | Status: DC | PRN

## 2021-02-05 MED ORDER — ACETAMINOPHEN 325 MG PO TABS: 325.0000 mg | ORAL_TABLET | ORAL | Status: DC | PRN

## 2021-02-05 MED ORDER — ACETAMINOPHEN 160 MG/5ML PO SOLN: 325.0000 mg | ORAL | Status: DC | PRN

## 2021-02-05 MED ORDER — SUCCINYLCHOLINE CHLORIDE 200 MG/10ML IV SOSY
PREFILLED_SYRINGE | INTRAVENOUS | Status: DC | PRN
  Administered 2021-02-05: 100 mg via INTRAVENOUS

## 2021-02-05 MED ORDER — KETOROLAC TROMETHAMINE 30 MG/ML IJ SOLN
INTRAMUSCULAR | Status: DC | PRN
  Administered 2021-02-05: 30 mg via INTRAMUSCULAR

## 2021-02-05 MED ORDER — 0.9 % SODIUM CHLORIDE (POUR BTL) OPTIME
TOPICAL | Status: DC | PRN
  Administered 2021-02-05: 1000 mL

## 2021-02-05 MED ORDER — BUPIVACAINE-EPINEPHRINE (PF) 0.5% -1:200000 IJ SOLN
INTRAMUSCULAR | Status: DC | PRN
  Administered 2021-02-05: 30 mL via PERINEURAL

## 2021-02-05 MED ORDER — LIDOCAINE 2% (20 MG/ML) 5 ML SYRINGE
INTRAMUSCULAR | Status: DC | PRN
  Administered 2021-02-05: 80 mg via INTRAVENOUS

## 2021-02-05 MED ORDER — SODIUM CHLORIDE 0.9 % IR SOLN
Status: DC | PRN
  Administered 2021-02-05: 3000 mL

## 2021-02-05 SURGICAL SUPPLY — 58 items
ADH SKN CLS APL DERMABOND .7 (GAUZE/BANDAGES/DRESSINGS) ×2
APL PRP STRL LF DISP 70% ISPRP (MISCELLANEOUS) ×1
BLADE CLIPPER SURG (BLADE) IMPLANT
CHLORAPREP W/TINT 26 (MISCELLANEOUS) ×2 IMPLANT
COVER SURGICAL LIGHT HANDLE (MISCELLANEOUS) ×2 IMPLANT
COVER WAND RF STERILE (DRAPES) ×2 IMPLANT
DERMABOND ADVANCED (GAUZE/BANDAGES/DRESSINGS) ×2
DERMABOND ADVANCED .7 DNX12 (GAUZE/BANDAGES/DRESSINGS) ×2 IMPLANT
DRAPE IMP U-DRAPE 54X76 (DRAPES) ×2 IMPLANT
DRAPE SHEET LG 3/4 BI-LAMINATE (DRAPES) ×4 IMPLANT
DRAPE STERI IOBAN 125X83 (DRAPES) ×2 IMPLANT
DRAPE U-SHAPE 47X51 STRL (DRAPES) ×4 IMPLANT
DRESSING PREVENA PLUS CUSTOM (GAUZE/BANDAGES/DRESSINGS) IMPLANT
DRSG AQUACEL AG ADV 3.5X10 (GAUZE/BANDAGES/DRESSINGS) ×2 IMPLANT
DRSG PREVENA PLUS CUSTOM (GAUZE/BANDAGES/DRESSINGS) ×2
ELECT REM PT RETURN 15FT ADLT (MISCELLANEOUS) ×2 IMPLANT
EVACUATOR 1/8 PVC DRAIN (DRAIN) IMPLANT
GLOVE SRG 8 PF TXTR STRL LF DI (GLOVE) ×1 IMPLANT
GLOVE SURG ENC MOIS LTX SZ8.5 (GLOVE) ×4 IMPLANT
GLOVE SURG ENC TEXT LTX SZ7.5 (GLOVE) ×4 IMPLANT
GLOVE SURG UNDER POLY LF SZ8 (GLOVE) ×2
GLOVE SURG UNDER POLY LF SZ8.5 (GLOVE) ×2 IMPLANT
GOWN STRL REUS W/ TWL LRG LVL3 (GOWN DISPOSABLE) ×1 IMPLANT
GOWN STRL REUS W/TWL 2XL LVL3 (GOWN DISPOSABLE) ×2 IMPLANT
GOWN STRL REUS W/TWL LRG LVL3 (GOWN DISPOSABLE) ×2
HANDPIECE INTERPULSE COAX TIP (DISPOSABLE) ×2
HEAD FEM UNIPOLAR 47 OD STRL (Hips) ×1 IMPLANT
HOOD PEEL AWAY FLYTE STAYCOOL (MISCELLANEOUS) ×6 IMPLANT
JET LAVAGE IRRISEPT WOUND (IRRIGATION / IRRIGATOR)
KIT DRSG PREVENA PLUS 7DAY 125 (MISCELLANEOUS) ×1 IMPLANT
KIT TURNOVER KIT A (KITS) ×2 IMPLANT
LAVAGE JET IRRISEPT WOUND (IRRIGATION / IRRIGATOR) IMPLANT
MANIFOLD NEPTUNE II (INSTRUMENTS) ×2 IMPLANT
MARKER SKIN DUAL TIP RULER LAB (MISCELLANEOUS) ×2 IMPLANT
NDL SPNL 18GX3.5 QUINCKE PK (NEEDLE) ×1 IMPLANT
NEEDLE SPNL 18GX3.5 QUINCKE PK (NEEDLE) ×2 IMPLANT
NS IRRIG 1000ML POUR BTL (IV SOLUTION) ×2 IMPLANT
PACK ANTERIOR HIP CUSTOM (KITS) ×2 IMPLANT
PENCIL SMOKE EVACUATOR (MISCELLANEOUS) IMPLANT
SAW OSC TIP CART 19.5X105X1.3 (SAW) ×2 IMPLANT
SEALER BIPOLAR AQUA 6.0 (INSTRUMENTS) ×2 IMPLANT
SET HNDPC FAN SPRY TIP SCT (DISPOSABLE) ×1 IMPLANT
SPACER FEM TAPERED +0 12/14 (Hips) ×1 IMPLANT
STAPLER VISISTAT 35W (STAPLE) ×1 IMPLANT
STEM TRI LOC GRIPTION SZ 9 STD IMPLANT
SUT ETHIBOND NAB CT1 #1 30IN (SUTURE) ×4 IMPLANT
SUT MNCRL AB 3-0 PS2 18 (SUTURE) ×2 IMPLANT
SUT MON AB 2-0 CT1 36 (SUTURE) ×3 IMPLANT
SUT STRATAFIX PDO 1 14 VIOLET (SUTURE) ×2
SUT STRATFX PDO 1 14 VIOLET (SUTURE) ×1
SUT VIC AB 1 CT1 27 (SUTURE) ×2
SUT VIC AB 1 CT1 27XBRD ANTBC (SUTURE) ×1 IMPLANT
SUT VIC AB 2-0 CT1 27 (SUTURE) ×2
SUT VIC AB 2-0 CT1 TAPERPNT 27 (SUTURE) ×1 IMPLANT
SUTURE STRATFX PDO 1 14 VIOLET (SUTURE) ×1 IMPLANT
TRAY FOLEY MTR SLVR 16FR STAT (SET/KITS/TRAYS/PACK) IMPLANT
TRI LOC GRIPTION SZ 9 STD ×2 IMPLANT
WATER STERILE IRR 1000ML POUR (IV SOLUTION) ×4 IMPLANT

## 2021-02-05 NOTE — Consult Note (Signed)
ORTHOPAEDIC CONSULTATION  REQUESTING PHYSICIAN: Arrien, Jimmy [place],*  PCP:  Maury Dus, MD  Chief Complaint: Intractable left hip pain  HPI: Melinda Kerr is a 86 y.o. female with a past medical history of hypertension, CAD, CVA, OSA on CPAP, endometrial cancer in remission, hyperlipidemia, previous DVT who had a fall onto the left hip about 5 or 6 weeks ago.  She has had progressive left hip pain.  Last year, she underwent ORIF of a right distal femur periprosthetic femur fracture by Dr. Doreatha Martin.  She has been nonambulatory for about 2 years.  She has severe left hip pain with transferring and with her ADLs.  Past Medical History:  Diagnosis Date  . Arthritis    "shoulders, knees, lower back" (07/14/2017)  . CHF (congestive heart failure) ([place])    "while in hospital w/hysterectomy in 2017"  . Chronic lower back pain   . Depression   . Diverticulitis of large intestine with abscess 07/13/2017  . DVT (deep venous thrombosis) ([place])   . Dyspnea    "cause I'm over weight"  . Esophageal reflux   . Gout   . Hyperlipidemia    under control  . Hypertension   . Hypothyroidism   . Morbid obesity with BMI of 40.0-44.9, adult ([place])   . Nocturia   . OSA on CPAP    "not wearing it when I sleep in my lift chair" (07/14/2017)  . Osteoarthritis   . Phlebitis    hx of  . Pneumonia ?1989  . Spondylosis    with scoliosis  . Type II diabetes mellitus (HCC)    borderline , diet controlled   . Urinary frequency   . Uterine cancer (HCC)    S/P chemo, radiation, hysterectomy   Past Surgical History:  Procedure Laterality Date  . APPENDECTOMY    . CATARACT EXTRACTION, BILATERAL Bilateral 2018  . COLONOSCOPY    . DILATATION & CURETTAGE/HYSTEROSCOPY WITH MYOSURE N/A 03/04/2016   Procedure: DILATATION & CURETTAGE/HYSTEROSCOPY ;  Surgeon: Azucena Fallen, MD;  Location: [facility];  Service: Gynecology;  Laterality: N/A;  polyp removal   . ESOPHAGOGASTRODUODENOSCOPY (EGD) WITH PROPOFOL  N/A 11/14/2018   Procedure: ESOPHAGOGASTRODUODENOSCOPY (EGD) WITH PROPOFOL;  Surgeon: Wonda Horner, MD;  Location: [facility];  Service: Endoscopy;  Laterality: N/A;  . FEMUR IM NAIL Right 04/09/2019   Procedure: INTRAMEDULLARY (IM) NAIL INTERTROCH;  Surgeon: Renette Butters, MD;  Location: [facility];  Service: Orthopedics;  Laterality: Right;  . HARDWARE REMOVAL Right 05/09/2020   Procedure: HARDWARE REMOVAL RIGHT FEMUR;  Surgeon: Shona Needles, MD;  Location: [facility];  Service: Orthopedics;  Laterality: Right;  . HERNIA REPAIR    . IR GENERIC HISTORICAL  05/15/2016   IR US GUIDE VASC ACCESS RIGHT 05/15/2016 Aletta Edouard, MD [facility]  . IR GENERIC HISTORICAL  05/15/2016   IR FLUORO GUIDE CV LINE RIGHT 05/15/2016 Aletta Edouard, MD [facility]  . IR REMOVAL TUN ACCESS W/ PORT W/O FL MOD SED  04/19/2018  . LAPAROSCOPIC CHOLECYSTECTOMY  2004  . LAPAROSCOPIC INCISIONAL / UMBILICAL / VENTRAL HERNIA REPAIR  2004   "w/gallbladder OR"  . LYMPH NODE BIOPSY N/A 03/26/2016   Procedure: [place] LYMPH NODE BIOPSY;  Surgeon: Everitt Amber, MD;  Location: [facility];  Service: Gynecology;  Laterality: N/A;  . ORIF FEMUR FRACTURE Right 05/09/2020   Procedure: OPEN REDUCTION INTERNAL FIXATION (ORIF) DISTAL FEMUR FRACTURE;  Surgeon: Shona Needles, MD;  Location: [facility];  Service: Orthopedics;  Laterality: Right;  .  REVERSE SHOULDER ARTHROPLASTY Right 10/26/2018   Procedure: REVERSE SHOULDER ARTHROPLASTY;  Surgeon: Nicholes Stairs, MD;  Location: [facility];  Service: Orthopedics;  Laterality: Right;  . ROBOTIC ASSISTED TOTAL HYSTERECTOMY WITH BILATERAL SALPINGO OOPHERECTOMY N/A 03/26/2016   Procedure: XI ROBOTIC ASSISTED TOTAL Laproscopic HYSTERECTOMY WITH BILATERAL SALPINGO OOPHORECTOMY;  Surgeon: Everitt Amber, MD;  Location: [facility];  Service: Gynecology;  Laterality: N/A;  . TOTAL KNEE ARTHROPLASTY Left 10/09/2013   Procedure: LEFT TOTAL KNEE ARTHROPLASTY;  Surgeon: [name], MD;  Location: [facility];  Service:  Orthopedics;  Laterality: Left;  . TOTAL KNEE ARTHROPLASTY Right 04/09/2014   Procedure: RIGHT TOTAL KNEE ARTHROPLASTY;  Surgeon: [name], MD;  Location: [facility];  Service: Orthopedics;  Laterality: Right;  . TUBAL LIGATION  1982  . VARICOSE VEIN SURGERY Bilateral 1968  . [place] EXTRACTION  1963   Social History   Socioeconomic History  . Marital status: Widowed    Spouse name: Not on file  . Number of children: 0  . Years of education: Not on file  . Highest education level: Not on file  Occupational History  . 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 Smoking status: Never Smoker  . Smokeless tobacco: Never Used  Vaping Use  . Vaping Use: Never used  Substance and Sexual Activity  . Alcohol use: No  . Drug use: No  . Sexual activity: Never  Other Topics Concern  . Not on file  Social History Narrative  . Not on file   Social Determinants of Health   Financial Resource Strain: Not on file  Food Insecurity: Not on file  Transportation Needs: Not on file  Physical Activity: Not on file  Stress: Not on file  Social Connections: Not on file   Family History  Problem Relation Age of Onset  . Heart disease Mother   . Heart disease Father   . CVA Father    Allergies  Allergen Reactions  . Oxybutynin Other (See Comments)    Bleeding gums, felt sick   Prior to Admission medications   Medication Sig Start Date End Date Taking? Authorizing Provider  albuterol (VENTOLIN HFA) 108 (90 Base) MCG/ACT inhaler Inhale 2 puffs into the lungs every 8 (eight) hours as needed for wheezing.   Yes [provider]  allopurinol (ZYLOPRIM) 300 MG tablet Take 300 mg by mouth daily.    Yes [provider]  aspirin EC 81 MG tablet Take 81 mg by mouth daily. Swallow whole.   Yes [provider]  baclofen (LIORESAL) 10 MG tablet Take 1 tablet (10 mg total) by mouth 3 (three) times daily as needed for muscle spasms. Patient taking differently:  Take 10 mg by mouth at bedtime. 04/12/19  Yes Swayze, Ava, DO  calcium carbonate (TUMS - DOSED IN MG ELEMENTAL CALCIUM) 500 MG chewable tablet Chew 1 tablet by mouth daily.   Yes [provider]  cetirizine (ZYRTEC) 10 MG tablet Take 10 mg by mouth daily.   Yes [provider]  citalopram (CELEXA) 10 MG tablet Take 10 mg by mouth daily. 03/20/19  Yes [provider]  diclofenac Sodium (VOLTAREN) 1 % GEL Apply 2 g topically every 12 (twelve) hours as needed (pain).   Yes [provider]  docusate sodium (COLACE) 100 MG capsule Take 1 capsule (100 mg total) by mouth 2 (two) times daily. 05/16/19  Yes [place] Eric J, DO  furosemide (LASIX) 40 MG tablet Take 0.5 tablets (20 mg total)  by mouth daily. Patient taking differently: Take 40 mg by mouth daily. 05/11/20  Yes Lavina Hamman, MD  HYDROcodone-acetaminophen (NORCO/VICODIN) 5-325 MG tablet Take 1 tablet by mouth every 6 (six) hours as needed for severe pain. Patient taking differently: Take 1 tablet by mouth every 6 (six) hours. 05/11/20  Yes Delray Alt, PA-C  hydrOXYzine (ATARAX/VISTARIL) 25 MG tablet Take 25 mg by mouth at bedtime.   Yes [provider]  iron polysaccharides (NIFEREX) 150 MG capsule Take 150 mg by mouth daily.   Yes [provider]  levothyroxine (SYNTHROID, LEVOTHROID) 125 MCG tablet Take 125 mcg by mouth daily at 6 (six) AM.    Yes [provider]  metoCLOPramide (REGLAN) 5 MG tablet Take 5 mg by mouth 2 (two) times daily at 8 am and 10 pm.   Yes [provider]  nystatin (MYCOSTATIN/NYSTOP) powder Apply 1 Bottle topically See admin instructions. Apply topically to abdominal skin folds and under bilateral breast for intertrigo - twice daily - please make sure skin has been cleaned and is dry before applying   Yes [provider]  Omega-3 Fatty Acids (FISH OIL) 1000 MG CAPS Take 1,000 mg by mouth daily.    Yes [provider]  pantoprazole  (PROTONIX) 40 MG tablet Take 1 tablet (40 mg total) by mouth 2 (two) times daily before a meal. 11/17/18  Yes Gherghe, Vella Redhead, MD  polyethylene glycol (MIRALAX / GLYCOLAX) packet Take 17 g by mouth daily as needed for mild constipation or moderate constipation. Patient taking differently: Take 17 g by mouth daily. 04/12/14  Yes Perkins, Alexzandrew L, PA-C  pravastatin (PRAVACHOL) 10 MG tablet Take 10 mg by mouth every Thursday. At bedtime 02/14/18  Yes [provider]  pyridOXINE (VITAMIN B-6) 100 MG tablet Take 100 mg by mouth daily.   Yes [provider]  saccharomyces boulardii (FLORASTOR) 250 MG capsule Take 250 mg by mouth 2 (two) times daily.   Yes [provider]  senna-docusate (SENOKOT-S) 8.6-50 MG tablet Take 1 tablet by mouth daily.   Yes [provider]  Simethicone 250 MG CAPS Take 250 mg by mouth every 8 (eight) hours as needed (gas).   Yes [provider]  spironolactone (ALDACTONE) 25 MG tablet Take 25 mg by mouth daily.   Yes [provider]  vitamin B-12 (CYANOCOBALAMIN) 1000 MCG tablet Take 1,000 mcg by mouth daily.    Yes [provider]  vitamin C (ASCORBIC ACID) 500 MG tablet Take 500 mg by mouth daily.   Yes [provider]  Vitamin D, Ergocalciferol, (DRISDOL) 1.25 MG (50000 UT) CAPS capsule Take 50,000 Units by mouth every 7 (seven) days. Monday   Yes [provider]  zolpidem (AMBIEN) 10 MG tablet Take 1 tablet (10 mg total) by mouth at bedtime. 05/11/20  Yes Lavina Hamman, MD  acetaminophen (TYLENOL) 325 MG tablet Take 2 tablets (650 mg total) by mouth every 6 (six) hours as needed for mild pain or moderate pain (pain score 1-3 or temp > 100.5). 04/13/19   Bonnell Public, MD  enoxaparin (LOVENOX) 40 MG/0.4ML injection Inject 0.4 mLs (40 mg total) into the skin daily. 05/11/20 06/10/20  Delray Alt, PA-C   ECHOCARDIOGRAM COMPLETE  Result Date: 02/04/2021    ECHOCARDIOGRAM REPORT    Patient Name:   ZURISADAI HELMINIAK Date of Exam: 02/04/2021 Medical Rec #:  [id number]       Height:       60.0  in Accession #:    [id number]      Weight:       245.0 lb Date of Birth:  [birth year]        BSA:          2.035 m Patient Age:    55 years        BP:           128/79 mmHg Patient Gender: F               HR:           71 bpm. Exam Location:  Inpatient Procedure: 2D Echo, Color Doppler and Cardiac Doppler Indications:    I48.91* Unspeicified atrial fibrillation  History:        Patient has prior history of Echocardiogram examinations, most                 recent 05/15/2019. CHF, Signs/Symptoms:Dyspnea; Risk                 Factors:Diabetes, Hypertension and Dyslipidemia.  Sonographer:    Bernadene Person RDCS Referring Phys: [phone] A CALDWELL [place]  1. Left ventricular ejection fraction, by estimation, is 60 to 65%. The left ventricle has normal function. The left ventricle has no regional wall motion abnormalities. Left ventricular diastolic parameters are consistent with Grade II diastolic dysfunction (pseudonormalization).  2. Right ventricular systolic function is normal. The right ventricular size is normal. There is mildly elevated pulmonary artery systolic pressure.  3. Left atrial size was mildly dilated.  4. The mitral valve is normal in structure. No evidence of mitral valve regurgitation. No evidence of mitral stenosis.  5. The aortic valve is tricuspid. Aortic valve regurgitation is not visualized. Mild aortic valve sclerosis is present, with no evidence of aortic valve stenosis.  6. The inferior vena cava is normal in size with greater than 50% respiratory variability, suggesting right atrial pressure of 3 mmHg. FINDINGS  Left Ventricle: Left ventricular ejection fraction, by estimation, is 60 to 65%. The left ventricle has normal function. The left ventricle has no regional wall motion abnormalities. The left ventricular internal cavity size was normal in size. There is  no left  ventricular hypertrophy. Left ventricular diastolic parameters are consistent with Grade II diastolic dysfunction (pseudonormalization). Right Ventricle: The right ventricular size is normal. No increase in right ventricular wall thickness. Right ventricular systolic function is normal. There is mildly elevated pulmonary artery systolic pressure. The tricuspid regurgitant velocity is 2.72  m/s, and with an assumed right atrial pressure of 5 mmHg, the estimated right ventricular systolic pressure is [id number] mmHg. Left Atrium: Left atrial size was mildly dilated. Right Atrium: Right atrial size was normal in size. Pericardium: There is no evidence of pericardial effusion. Presence of pericardial fat pad. Mitral Valve: The mitral valve is normal in structure. Mild mitral annular calcification. No evidence of mitral valve regurgitation. No evidence of mitral valve stenosis. Tricuspid Valve: The tricuspid valve is normal in structure. Tricuspid valve regurgitation is not demonstrated. No evidence of tricuspid stenosis. Aortic Valve: The aortic valve is tricuspid. Aortic valve regurgitation is not visualized. Mild aortic valve sclerosis is present, with no evidence of aortic valve stenosis. Pulmonic Valve: The pulmonic valve was normal in structure. Pulmonic valve regurgitation is not visualized. No evidence of pulmonic stenosis. Aorta: The aortic root is normal in size and structure. Venous: The inferior vena cava was not well visualized. The inferior vena cava is normal in size with greater than  50% respiratory variability, suggesting right atrial pressure of 3 mmHg. IAS/Shunts: No atrial level shunt detected by color flow Doppler.  LEFT VENTRICLE PLAX 2D LVIDd:         3.80 cm  Diastology LVIDs:         2.40 cm  LV e' medial:    5.52 cm/s LV PW:         0.90 cm  LV E/e' medial:  16.2 LV IVS:        1.00 cm  LV e' lateral:   7.71 cm/s LVOT diam:     1.90 cm  LV E/e' lateral: 11.6 LV SV:         71 LV SV Index:   35 LVOT  Area:     2.84 cm  RIGHT VENTRICLE RV S prime:     13.80 cm/s TAPSE (M-mode): 2.1 cm LEFT ATRIUM             Index       RIGHT ATRIUM           Index LA diam:        4.16 cm 2.04 cm/m  RA Area:     14.60 cm LA Vol (A2C):   55.4 ml 27.23 ml/m RA Volume:   36.00 ml  17.69 ml/m LA Vol (A4C):   50.1 ml 24.62 ml/m LA Biplane Vol: 54.4 ml 26.74 ml/m  AORTIC VALVE LVOT Vmax:   104.00 cm/s LVOT Vmean:  82.500 cm/s LVOT VTI:    0.252 m  AORTA Ao Root diam: 3.30 cm Ao Asc diam:  3.30 cm MITRAL VALVE               TRICUSPID VALVE MV Area (PHT): 1.34 cm    TR Peak grad:   29.6 mmHg MV Decel Time: 567 msec    TR Vmax:        272.00 cm/s MV E velocity: 89.70 cm/s MV A velocity: 78.20 cm/s  SHUNTS MV E/A ratio:  1.15        Systemic VTI:  0.25 m                            Systemic Diam: 1.90 cm Dani Gobble Croitoru MD Electronically signed by Sanda Klein MD Signature Date/Time: 02/04/2021/11:29:07 AM    Final    DG Hip Unilat With Pelvis 2-3 Views Left  Result Date: 02/03/2021 CLINICAL DATA:  Left hip pain. EXAM: DG HIP (WITH OR WITHOUT PELVIS) 2-3V LEFT COMPARISON:  None. FINDINGS: An acute fracture deformity is seen extending through the neck of the proximal left femur. Approximately 1/2 shaft width superior and lateral displacement of the distal fracture site is seen. There is no evidence of dislocation. A radiopaque intramedullary rod and compression screw device are seen within the proximal right femur. Degenerative changes are seen involving both hips in the form of joint space narrowing and acetabular sclerosis. Benign-appearing sclerosis is seen along the neck of the proximal right femur. Multiple phleboliths are noted within the pelvis. IMPRESSION: 1. Acute fracture of the proximal left femur. 2. Prior open reduction internal fixation of the proximal right femur. Electronically Signed   By: Virgina Norfolk M.D.   On: 02/03/2021 17:11    Positive ROS: All other systems have been reviewed and were otherwise  negative with the exception of those mentioned in the HPI and as above.  Physical Exam: General: Alert, no acute distress Cardiovascular: No pedal edema Respiratory: No  cyanosis, no use of accessory musculature GI: No organomegaly, abdomen is soft and non-tender Skin: No lesions in the area of chief complaint Neurologic: Sensation intact distally Psychiatric: Patient is competent for consent with normal mood and affect Lymphatic: No axillary or cervical lymphadenopathy  MUSCULOSKELETAL: Examination of the left hip reveals that the extremity is shortened and externally rotated.  No skin wounds or lesions around the hip.  The pannicular skin fold is clear.  She has pain with attempted logrolling of the hip.  She is neurovascularly intact distally.  Assessment: Subacute left femoral neck fracture. Nonambulator. Intractable left hip pain.  Plan: I discussed the findings with the patient and her family.  We discussed that nonambulator's are typically treated nonoperatively.  However, in this case she is having intractable hip pain that is ruining her quality of life.  Therefore, she has been indicated for hemiarthroplasty of the left hip for pain control purposes.  We reviewed the risk, benefits, and alternatives.  Please see statement of risk.  Plan for surgery today.  The risks, benefits, and alternatives were discussed with the patient. There are risks associated with the surgery including, but not limited to, problems with anesthesia (death), infection, instability (giving out of the joint), dislocation, differences in leg length/angulation/rotation, fracture of bones, loosening or failure of implants, hematoma (blood accumulation) which may require surgical drainage, blood clots, pulmonary embolism, nerve injury (foot drop and lateral thigh numbness), and blood vessel injury. The patient understands these risks and elects to proceed.  Bertram Savin, MD [phone]    02/05/2021 3:48  PM

## 2021-02-05 NOTE — Op Note (Signed)
OPERATIVE REPORT  SURGEON: Rod Can, MD   ASSISTANT: Cherlynn June, PA-C  PREOPERATIVE DIAGNOSIS: Subacute displaced Left femoral neck fracture.   POSTOPERATIVE DIAGNOSIS: Subacute displaced Left femoral neck fracture.   PROCEDURE: Left hip hemiarthroplasty, anterior approach.   IMPLANTS: DePuy Tri Lock stem, size 9, std offset, with a +0 mm spacer and a 47 mm monopolar head ball.  ANESTHESIA:  General  ANTIBIOTICS: 2g ancef.  ESTIMATED BLOOD LOSS:-350 mL    DRAINS: None.  COMPLICATIONS: None   CONDITION: PACU - hemodynamically stable.   BRIEF CLINICAL NOTE: Melinda Kerr is a 86 y.o. female with a displaced Left femoral neck fracture. The patient was admitted to the hospitalist service and underwent perioperative risk stratification and medical optimization. She is a nonambulator who has been having severe left hip pain for the past 6 weeks. The risks, benefits, and alternatives to hemiarthroplasty were explained, and the patient elected to proceed.  PROCEDURE IN DETAIL: The patient was taken to the operating room and general anesthesia was induced on the hospital bed.  The patient was then positioned on the Hana table.  All bony prominences were well padded.  The hip was prepped and draped in the normal sterile surgical fashion.  A time-out was called verifying side and site of surgery. Antibiotics were given within 60 minutes of beginning the procedure.   The direct anterior approach to the hip was performed through the Hueter interval.  Lateral femoral circumflex vessels were treated with the Auqumantys. The anterior capsule was exposed and an inverted T capsulotomy was made.  Fracture hematoma was encountered and evacuated. The patient was found to have a comminuted Left subcapital femoral neck fracture.  There was no evidence of tumor or infection.  I freshened the femoral neck cut with a saw.  I removed the femoral neck fragment.  A corkscrew was placed into the  head and the head was removed.  This was passed to the back table and was measured.   Acetabular exposure was achieved.  I examined the articular cartilage which was intact.  The labrum was intact. A 47 mm trial head was placed and found to have excellent fit.   I then gained femoral exposure taking care to protect the abductors and greater trochanter.  This was performed using standard external rotation, extension, and adduction.  The capsule was peeled off the inner aspect of the greater trochanter, taking care to preserve the short external rotators. A cookie cutter was used to enter the femoral canal, and then the femoral canal finder was used to confirm location.  I then sequentially broached up to a size 9.  Calcar planer was used on the femoral neck remnant.  I paced a std neck and a 36 + 1.5 head ball. The hip was reduced.  Leg lengths were checked fluoroscopically.  The hip was dislocated and trial components were removed.  I placed the real stem followed by the real spacer and head ball.  A single reduction maneuver was performed and the hip was reduced.  Fluoroscopy was used to confirm component position and leg lengths.  At 90 degrees of external rotation and extension, the hip was stable to an anterior directed force.   The wound was copiously irrigated with Irrisept solution and normal saline using pule lavage.  Marcaine solution was injected into the periarticular soft tissue.  The wound was closed in layers using #1 Vicryl and V-Loc for the fascia, 2-0 Vicryl for the subcutaneous fat, 2-0 Monocryl for the  deep dermal layer, and staples for the skin.  Customizable Prevena incisional negative pressure dressing was applied, and suction was placed to 125 mm Hg without leak. The patient was then awakened from anesthesia and transported to the recovery room in stable condition.  Sponge, needle, and instrument counts were correct at the end of the case x2.  The patient tolerated the procedure well and  there were no known complications.  Please note that a surgical assistant was a medical necessity for this procedure to perform it in a safe and expeditious manner. Assistant was necessary to provide appropriate retraction of vital neurovascular structures, to prevent femoral fracture, and to allow for anatomic placement of the prosthesis.

## 2021-02-05 NOTE — Anesthesia Procedure Notes (Signed)
Procedure Name: Intubation Date/Time: 02/05/2021 5:04 PM Performed by: Montel Clock, CRNA Pre-anesthesia Checklist: Patient identified, Emergency Drugs available, Suction available, Patient being monitored and Timeout performed Patient Re-evaluated:Patient Re-evaluated prior to induction Oxygen Delivery Method: Circle system utilized Preoxygenation: Pre-oxygenation with 100% oxygen Induction Type: IV induction, Rapid sequence and Cricoid Pressure applied Laryngoscope Size: Mac and 3 Grade View: Grade II Tube type: Oral Tube size: 7.0 mm Number of attempts: 1 Airway Equipment and Method: Stylet Placement Confirmation: ETT inserted through vocal cords under direct vision,  positive ETCO2 and breath sounds checked- equal and bilateral Secured at: 21 cm Tube secured with: Tape Dental Injury: Teeth and Oropharynx as per pre-operative assessment

## 2021-02-05 NOTE — Progress Notes (Signed)
Pt continues to refuse CPAP qhs.  Pt encouraged to contact RT should she change her mind. 

## 2021-02-05 NOTE — Anesthesia Preprocedure Evaluation (Addendum)
Anesthesia Evaluation  Patient identified by MRN, date of birth, ID band Patient awake    Reviewed: Allergy & Precautions, NPO status , Patient's Chart, lab work & pertinent test results  Airway Mallampati: II  TM Distance: >3 FB Neck ROM: Full    Dental  (+) Teeth Intact, Dental Advisory Given   Pulmonary sleep apnea ,    breath sounds clear to auscultation       Cardiovascular hypertension, Pt. on medications + CAD and +CHF   Rhythm:Regular Rate:Normal     Neuro/Psych PSYCHIATRIC DISORDERS Depression TIACVA    GI/Hepatic Neg liver ROS, hiatal hernia, GERD  Medicated,  Endo/Other  diabetes, Type 2Hypothyroidism   Renal/GU negative Renal ROS     Musculoskeletal  (+) Arthritis ,   Abdominal (+) + obese,   Peds  Hematology negative hematology ROS (+)   Anesthesia Other Findings   Reproductive/Obstetrics                            Anesthesia Physical Anesthesia Plan  ASA: III  Anesthesia Plan: General   Post-op Pain Management:    Induction: Intravenous  PONV Risk Score and Plan: 4 or greater and Ondansetron and Treatment may vary due to age or medical condition  Airway Management Planned: Oral ETT  Additional Equipment: None  Intra-op Plan:   Post-operative Plan: Extubation in OR  Informed Consent: I have reviewed the patients History and Physical, chart, labs and discussed the procedure including the risks, benefits and alternatives for the proposed anesthesia with the patient or authorized representative who has indicated his/her understanding and acceptance.   Patient has DNR.  Discussed DNR with patient and Suspend DNR.   Dental advisory given  Plan Discussed with: CRNA  Anesthesia Plan Comments: (Lab Results      Component                Value               Date                      WBC                      7.7                 02/05/2021                HGB                       12.7                02/05/2021                HCT                      39.6                02/05/2021                MCV                      98.3                02/05/2021                PLT  312                 02/05/2021            Lab Results      Component                Value               Date                      CREATININE               0.60                02/05/2021                BUN                      19                  02/05/2021                NA                       137                 02/05/2021                K                        3.5                 02/05/2021                CL                       100                 02/05/2021                CO2                      30                  02/05/2021            Lab Results      Component                Value               Date                      INR                      1.0                 02/03/2021                INR                      1.0                 05/08/2020                INR  1.3 (H)             05/14/2019            Echo:  1. Left ventricular ejection fraction, by estimation, is 60 to 65%. The  left ventricle has normal function. The left ventricle has no regional  wall motion abnormalities. Left ventricular diastolic parameters are  consistent with Grade II diastolic  dysfunction (pseudonormalization).  2. Right ventricular systolic function is normal. The right ventricular  size is normal. There is mildly elevated pulmonary artery systolic  pressure.  3. Left atrial size was mildly dilated.  4. The mitral valve is normal in structure. No evidence of mitral valve  regurgitation. No evidence of mitral stenosis.  5. The aortic valve is tricuspid. Aortic valve regurgitation is not  visualized. Mild aortic valve sclerosis is present, with no evidence of  aortic valve stenosis.  6. The inferior vena cava is normal in size with greater than  50%  respiratory variability, suggesting right atrial pressure of 3 mmHg. )       Anesthesia Quick Evaluation

## 2021-02-05 NOTE — Transfer of Care (Signed)
Immediate Anesthesia Transfer of Care Note  Patient: Melinda Kerr  Procedure(s) Performed: ANTERIOR APPROACH HEMI HIP ARTHROPLASTY (Left )  Patient Location: PACU  Anesthesia Type:General  Level of Consciousness: awake and patient cooperative  Airway & Oxygen Therapy: Patient Spontanous Breathing and Patient connected to face mask oxygen  Post-op Assessment: Report given to RN and Post -op Vital signs reviewed and stable  Post vital signs: Reviewed and stable--pt shivering; warm blankets applied  Last Vitals:  Vitals Value Taken Time  BP 114/100 02/05/21 1931  Temp    Pulse 81 02/05/21 1932  Resp 21 02/05/21 1932  SpO2 100 % 02/05/21 1932  Vitals shown include unvalidated device data.  Last Pain:  Vitals:   02/05/21 0825  TempSrc:   PainSc: 0-No pain      Patients Stated Pain Goal: 4 (24/26/83 4196)  Complications: No complications documented.

## 2021-02-05 NOTE — Progress Notes (Addendum)
PROGRESS NOTE    Melinda Kerr  GGY:[id number] DOB: 07/29/1935 DOA: 02/03/2021 PCP: Melinda Dus, MD    Brief Narrative:  Melinda Kerr was admitted to the hospital with left proximal femur fracture.  86 year old female past medical history for hypertension, coronary artery disease, CVA, OSA, dyslipidemia, endometrial cancer, history of DVT who presented after mechanical fall.  Patient was sitting in her power lift chair when she slipped out and fell on her left hip. On her initial physical examination blood pressure 104/83, heart rate 81, respiratory 25, temperature 98.7, oxygen saturation 96% her lungs are clear to auscultation bilaterally, heart S1-S2, present rhythmic, soft abdomen, no lower extremity edema, left lower extremity was shortened and internally rotated.  Left hip film with acute fracture of the proximal left femur.   Plan for surgical intervention today.   Assessment & Plan:   Principal Problem:   Femur fracture, left (HCC) Active Problems:   OSA on CPAP   Essential hypertension   Prolonged QT interval   History of CVA (cerebrovascular accident)   1. Left proximal femur fracture. Continue pain control (morphine and oxycodonel) and dvt prophylaxis.  Patient scheduled for surgical procedure today.  Follow up with PT, OT and orthopedic recommendations.   2. HTN. Chronic diastolic heart failure, prolonged qtc.  Continue blood pressure monitoring, for now ill hold on diuretic therapy, no signs of heart failure decompensation. Avoid qtc prolonging medications Keep K at 4 and Mg at 2.   3. Hx of CVA. Continue neuro checks per unit protocol.   4. Depression no agitation, continue with celexa.   5. Hypothyroid. On levothyroxine   6. Obesity class 3 and OSA.  On Cpap   7. Osteoarthritis and gout. No acute flare, continue PT and OT post surgery  Continue with allopurinol and baaclofen   8. Hypomagnesemia/ hypokalemia. Mg is 1,6, renal function stable with serum  cr at 0,60 and K at 3,5.  Continue Mg and K correction.  Follow renal function in am.   Continue to have high risk for medical complications related to acute fracture  Status is: Inpatient  Remains inpatient appropriate because:Inpatient level of care appropriate due to severity of illness   Dispo: The patient is from: Home              Anticipated d/c is to: Home              Patient currently is not medically stable to d/c.   Difficult to place patient No   DVT prophylaxis: Enoxaparin   Code Status:   DNR   Family Communication:  No family at the bedside       Consultants:   Orthopedics     Subjective: Patient with no nausea or vomiting, no chest pain or dyspnea. Continue to have pain on left hip, improved with analgesics.   Objective: Vitals:   02/04/21 1502 02/04/21 1956 02/04/21 2358 02/05/21 0457  BP: 117/75 (!) 113/96 118/65 (!) 122/107  Pulse: 71 73 77 72  Resp: 16 14 14 14   Temp: 98.4 F (36.9 C) 98.9 F (37.2 C) 98.2 F (36.8 C) 98.6 F (37 C)  TempSrc: Oral Oral Oral Oral  SpO2: 98% 92% (!) 85% 91%  Weight:      Height:        Intake/Output Summary (Last 24 hours) at 02/05/2021 1227 Last data filed at 02/05/2021 0502 Gross per 24 hour  Intake --  Output 575 ml  Net -575 ml   [employer]  02/03/21 1615 02/04/21 1128  Weight: 111.1 kg 110.1 kg    Examination:   General: Not in pain or dyspnea. Deconditioned  Neurology: Awake and alert, non focal  E ENT: no pallor, no icterus, oral mucosa moist Cardiovascular: No JVD. S1-S2 present, rhythmic, no gallops, rubs, or murmurs. No lower extremity edema. Pulmonary: positive breath sounds bilaterally, adequate air movement, no wheezing, rhonchi or rales. Gastrointestinal. Abdomen soft and non tender Skin. No rashes Musculoskeletal: left leg shortened.      Data Reviewed: I have personally reviewed following labs and imaging studies  CBC: Recent Labs  Lab 02/03/21 1635 02/04/21 0335  02/05/21 0524  WBC 9.0 9.1 7.7  NEUTROABS 6.6 5.3 5.0  HGB 15.1* 13.9 12.7  HCT 46.9* 44.6 39.6  MCV 96.1 99.6 98.3  PLT 359 311 301   Basic Metabolic Panel: Recent Labs  Lab 02/03/21 1635 02/04/21 0335 02/05/21 0524  NA 138 139 137  K 3.9 3.5 3.5  CL 98 100 100  CO2 27 33* 30  GLUCOSE 191* 158* 233*  BUN 14 14 19   CREATININE 0.66 0.70 0.60  CALCIUM 10.2 9.8 9.8  MG  --   --  1.6*  PHOS  --   --  3.6   GFR: Estimated Creatinine Clearance: 58.9 mL/min (by C-G formula based on SCr of 0.6 mg/dL). Liver Function Tests: Recent Labs  Lab 02/05/21 0524  AST 24  ALT 18  ALKPHOS 102  BILITOT 0.5  PROT 5.3*  ALBUMIN 2.9*   No results for input(s): LIPASE, AMYLASE in the last 168 hours. No results for input(s): AMMONIA in the last 168 hours. Coagulation Profile: Recent Labs  Lab 02/03/21 1635  INR 1.0   Cardiac Enzymes: No results for input(s): CKTOTAL, CKMB, CKMBINDEX, TROPONINI in the last 168 hours. BNP (last 3 results) No results for input(s): PROBNP in the last 8760 hours. HbA1C: No results for input(s): HGBA1C in the last 72 hours. CBG: No results for input(s): GLUCAP in the last 168 hours. Lipid Profile: No results for input(s): CHOL, HDL, LDLCALC, TRIG, CHOLHDL, LDLDIRECT in the last 72 hours. Thyroid Function Tests: Recent Labs    02/04/21 0823  TSH 2.473   Anemia Panel: No results for input(s): VITAMINB12, FOLATE, FERRITIN, TIBC, IRON, RETICCTPCT in the last 72 hours.    Radiology Studies: I have reviewed all of the imaging during this hospital visit personally     Scheduled Meds: . acetaminophen  1,000 mg Oral Q8H  . allopurinol  300 mg Oral Daily  . baclofen  10 mg Oral QHS  . chlorhexidine  60 mL Topical Once  . citalopram  10 mg Oral Daily  . hydrOXYzine  25 mg Oral QHS  . levothyroxine  125 mcg Oral Q0600  . pantoprazole  40 mg Oral BID  . povidone-iodine  2 application Topical Once  . povidone-iodine  2 application Topical Once   . [START ON 02/06/2021] pravastatin  10 mg Oral Once per day on Thu  . pyridOXINE  100 mg Oral Daily  . saccharomyces boulardii  250 mg Oral BID  . senna  1 tablet Oral BID  . vitamin B-12  1,000 mcg Oral Daily  . zolpidem  2.5 mg Oral QHS   Continuous Infusions: . tranexamic acid    . vancomycin       LOS: 2 days        Arlesia Kiel Gerome Apley, MD

## 2021-02-06 LAB — BASIC METABOLIC PANEL
Anion gap: 6 (ref 5–15)
BUN: 18 mg/dL (ref 8–23)
CO2: 33 mmol/L — ABNORMAL HIGH (ref 22–32)
Calcium: 10 mg/dL (ref 8.9–10.3)
Chloride: 102 mmol/L (ref 98–111)
Creatinine, Ser: 0.82 mg/dL (ref 0.44–1.00)
GFR, Estimated: >60 mL/min (ref >60)
Glucose, Bld: 148 mg/dL — ABNORMAL HIGH (ref 70–99)
Potassium: 4 mmol/L (ref 3.5–5.1)
Sodium: 141 mmol/L (ref 135–145)

## 2021-02-06 MED ORDER — ACETAMINOPHEN 500 MG PO TABS
1000.0000 mg | ORAL_TABLET | Freq: Three times a day (TID) | ORAL | 0 refills | Status: DC
Start: 2021-02-06 — End: 2021-03-13

## 2021-02-06 MED ORDER — ASPIRIN EC 81 MG PO TBEC: 81.0000 mg | DELAYED_RELEASE_TABLET | Freq: Two times a day (BID) | ORAL | 0 refills | Status: AC

## 2021-02-06 MED ORDER — ENOXAPARIN SODIUM 40 MG/0.4ML IJ SOSY
40.0000 mg | PREFILLED_SYRINGE | Freq: Every day | INTRAMUSCULAR | Status: DC
  Administered 2021-02-06 – 2021-02-07 (×2): 40 mg via SUBCUTANEOUS
  Filled 2021-02-06 (×2): qty 0.4

## 2021-02-06 MED ORDER — OXYCODONE HCL 5 MG PO TABS: 5.0000 mg | ORAL_TABLET | ORAL | 0 refills | Status: AC | PRN

## 2021-02-06 NOTE — TOC Initial Note (Signed)
Transition of Care [facility]) - Initial/Assessment Note    Patient Details  Name: Melinda Kerr MRN: [id number] Date of Birth: 16-Jul-1935  Transition of [facility]) CM/SW Contact:    Jaxiel Kines, Marjie Skiff, RN Phone Number: 02/06/2021, 2:56 PM  Clinical Narrative:                 Pt is from [place] facility and will go back there at dc. FL2 completed and TOC will continue to follow and assist with getting pt back to facility.   Expected Discharge Plan: Long Term Nursing Home Barriers to Discharge: Continued Medical Work up   Expected Discharge Plan and Services Expected Discharge Plan: [place]   Discharge Planning Services: CM Consult   Living arrangements for the past 2 months: [place]                    Prior Living Arrangements/Services Living arrangements for the past 2 months: [place] Lives with:: Facility Resident                   Activities of Daily Living Home Assistive Devices/Equipment: None (per pt) ADL Screening (condition at time of admission) Patient's cognitive ability adequate to safely complete daily activities?: No Is the patient deaf or have difficulty hearing?: Yes Does the patient have difficulty seeing, even when wearing glasses/contacts?: No Does the patient have difficulty concentrating, remembering, or making decisions?: Yes Patient able to express need for assistance with ADLs?: Yes Does the patient have difficulty dressing or bathing?: Yes Independently performs ADLs?: No Communication: Independent Dressing (OT): Needs assistance Is this a change from baseline?: Pre-admission baseline Grooming: Needs assistance Is this a change from baseline?: Pre-admission baseline Feeding: Independent Bathing: Needs assistance Is this a change from baseline?: Pre-admission baseline Toileting: Needs assistance Is this a change from baseline?: Pre-admission baseline In/Out Bed: Needs assistance Is this a  change from baseline?: Pre-admission baseline Walks in Home: Needs assistance Is this a change from baseline?: Pre-admission baseline Does the patient have difficulty walking or climbing stairs?: Yes Weakness of Legs: Left Weakness of Arms/Hands: None  Permission Sought/Granted                  Emotional Assessment       Orientation: : Oriented to Self,Oriented to Place,Oriented to Situation Alcohol / Substance Use: Not Applicable Psych Involvement: No (comment)  Admission diagnosis:  Femur fracture, left ([place]) [S72.92XA] Left displaced femoral neck fracture ([place]) [S72.002A] Fall, subsequent encounter [W19.XXXD] Patient Active Problem List   Diagnosis Date Noted  . Prolonged QT interval 02/04/2021  . History of CVA (cerebrovascular accident) 02/04/2021  . Femur fracture, left ([place]) 02/03/2021  . Closed fracture of right distal femur ([place]) 05/08/2020  . Acute CVA (cerebrovascular accident) ([place]) 05/15/2019  . TIA (transient ischemic attack) 05/14/2019  . Displaced comminuted fracture of shaft of right femur, initial encounter for closed fracture ([place]) 04/08/2019  . GIB (gastrointestinal bleeding) 11/13/2018  . Anemia associated with acute blood loss 11/13/2018  . Closed fracture of right proximal humerus 10/26/2018  . S/P reverse total shoulder arthroplasty, right 10/26/2018  . Acute appendicitis   . Diverticulitis of large intestine with abscess 07/13/2017  . Colitis 07/13/2017  . Aortic atherosclerosis ([place]) 09/12/2016  . Coronary artery calcification 09/12/2016  . Hiatal hernia 09/12/2016  . Lumbar disc disease 09/12/2016  . Chemotherapy-induced thrombocytopenia 08/04/2016  . Chemotherapy induced neutropenia ([place]) 05/26/2016  . Port catheter in place 05/26/2016  .  Encounter for antineoplastic chemotherapy 05/26/2016  . Urinary frequency 04/24/2016  . Obstructive sleep apnea on CPAP 04/24/2016  . History of DVT (deep vein thrombosis) 04/24/2016  . Endometrial  cancer, FIGO stage IIIC ([place]) 04/24/2016  . Malignant neoplasm of endometrium ([place]) 03/26/2016  . Dyspnea on exertion 03/19/2016  . Malignant neoplasm of uterus ([place]) 03/19/2016  . Status post right knee replacement 04/16/2014  . Essential hypertension 04/16/2014  . Postoperative anemia due to acute blood loss 04/12/2014  . Status post total left knee replacement 10/14/2013  . Hypothyroidism 10/14/2013  . UI (urinary incontinence) 10/14/2013  . Dyslipidemia 10/14/2013  . Constipation 10/14/2013  . Gout 10/14/2013  . Anemia 10/14/2013  . Allergic rhinitis 10/14/2013  . Insomnia 10/14/2013  . Depression 10/14/2013  . OA (osteoarthritis) of knee 10/09/2013  . OSA on CPAP 08/10/2013  . Morbid obesity with BMI of 45.0-49.9, adult ([place])    PCP:  Maury Dus, MD Pharmacy:  No Pharmacies Listed    Social Determinants of Health (SDOH) Interventions    Readmission Risk Interventions Readmission Risk Prevention Plan 02/06/2021 05/10/2020  Transportation Screening Complete Complete  PCP or Specialist Appt within 5-7 Days Complete Complete  Home Care Screening Complete Complete  Medication Review (RN CM) Complete Complete  Some recent data might be hidden

## 2021-02-06 NOTE — Anesthesia Postprocedure Evaluation (Signed)
Anesthesia Post Note  Patient: Shya L Brouillet  Procedure(s) Performed: ANTERIOR APPROACH HEMI HIP ARTHROPLASTY (Left )     Patient location during evaluation: PACU Anesthesia Type: General Level of consciousness: awake and alert Pain management: pain level controlled Vital Signs Assessment: post-procedure vital signs reviewed and stable Respiratory status: spontaneous breathing, nonlabored ventilation, respiratory function stable and patient connected to nasal cannula oxygen Cardiovascular status: blood pressure returned to baseline and stable Postop Assessment: no apparent nausea or vomiting Anesthetic complications: no   No complications documented.  Last Vitals:  Vitals:   02/06/21 0002 02/06/21 0408  BP: 101/71 (!) 106/54  Pulse: 71 64  Resp: 14 14  Temp: 36.8 C (!) 36.4 C  SpO2: 99% 99%    Last Pain:  Vitals:   02/06/21 0408  TempSrc: Oral  PainSc:                  Effie Berkshire

## 2021-02-06 NOTE — NC FL2 (Signed)
[place] LEVEL OF CARE SCREENING TOOL     IDENTIFICATION  Patient Name: Melinda Kerr Birthdate: 1935/08/01 Sex: female Admission Date (Current Location): 02/03/2021  [facility] and [place] Number:  Herbalist and Address:  [facility],  [place] [address] [place]      Provider Number: [id number]  Attending Physician Name and Address:  Shawna Clamp, MD  Relative Name and Phone Number:       Current Level of Care: Hospital Recommended Level of Care: [place] Prior Approval Number:    Date Approved/Denied:   PASRR Number:    Discharge Plan: SNF    Current Diagnoses: Patient Active Problem List   Diagnosis Date Noted  . Prolonged QT interval 02/04/2021  . History of CVA (cerebrovascular accident) 02/04/2021  . Femur fracture, left ([place]) 02/03/2021  . Closed fracture of right distal femur ([place]) 05/08/2020  . Acute CVA (cerebrovascular accident) ([place]) 05/15/2019  . TIA (transient ischemic attack) 05/14/2019  . Displaced comminuted fracture of shaft of right femur, initial encounter for closed fracture ([place]) 04/08/2019  . GIB (gastrointestinal bleeding) 11/13/2018  . Anemia associated with acute blood loss 11/13/2018  . Closed fracture of right proximal humerus 10/26/2018  . S/P reverse total shoulder arthroplasty, right 10/26/2018  . Acute appendicitis   . Diverticulitis of large intestine with abscess 07/13/2017  . Colitis 07/13/2017  . Aortic atherosclerosis ([place]) 09/12/2016  . Coronary artery calcification 09/12/2016  . Hiatal hernia 09/12/2016  . Lumbar disc disease 09/12/2016  . Chemotherapy-induced thrombocytopenia 08/04/2016  . Chemotherapy induced neutropenia ([place]) 05/26/2016  . Port catheter in place 05/26/2016  . Encounter for antineoplastic chemotherapy 05/26/2016  . Urinary frequency 04/24/2016  . Obstructive sleep apnea on CPAP 04/24/2016  . History of DVT (deep vein thrombosis) 04/24/2016  .  Endometrial cancer, FIGO stage IIIC ([place]) 04/24/2016  . Malignant neoplasm of endometrium ([place]) 03/26/2016  . Dyspnea on exertion 03/19/2016  . Malignant neoplasm of uterus ([place]) 03/19/2016  . Status post right knee replacement 04/16/2014  . Essential hypertension 04/16/2014  . Postoperative anemia due to acute blood loss 04/12/2014  . Status post total left knee replacement 10/14/2013  . Hypothyroidism 10/14/2013  . UI (urinary incontinence) 10/14/2013  . Dyslipidemia 10/14/2013  . Constipation 10/14/2013  . Gout 10/14/2013  . Anemia 10/14/2013  . Allergic rhinitis 10/14/2013  . Insomnia 10/14/2013  . Depression 10/14/2013  . OA (osteoarthritis) of knee 10/09/2013  . OSA on CPAP 08/10/2013  . Morbid obesity with BMI of 45.0-49.9, adult (HCC)     Orientation RESPIRATION BLADDER Height & Weight     Self,Situation,Place  Normal Incontinent Weight: 110.1 kg Height:  5' (152.4 cm)  BEHAVIORAL SYMPTOMS/MOOD NEUROLOGICAL BOWEL NUTRITION STATUS      Incontinent Diet (See dc summary)  AMBULATORY STATUS COMMUNICATION OF NEEDS Skin   Total Care Verbally Surgical wounds                       Personal Care Assistance Level of Assistance  Bathing,Feeding,Dressing Bathing Assistance: Maximum assistance Feeding assistance: Limited assistance Dressing Assistance: Maximum assistance     Functional Limitations Info  Sight,Hearing,Speech Sight Info: Impaired Hearing Info: Adequate Speech Info: Adequate    SPECIAL CARE FACTORS FREQUENCY                       Contractures Contractures Info: Not present    Additional Factors Info  Code Status,Allergies Code Status Info:  DNR Allergies Info: Oxybutynin           Current Medications (02/06/2021):  This is the current hospital active medication list Current Facility-Administered Medications  Medication Dose Route Frequency Provider Last Rate Last Admin  . acetaminophen (TYLENOL) tablet 1,000 mg  1,000 mg Oral Q8H  Elodia Florence., MD   1,000 mg at 02/06/21 1410  . albuterol (VENTOLIN HFA) 108 (90 Base) MCG/ACT inhaler 2 puff  2 puff Inhalation Q8H PRN Elodia Florence., MD      . allopurinol (ZYLOPRIM) tablet 300 mg  300 mg Oral Daily Elodia Florence., MD   300 mg at 02/06/21 0957  . baclofen (LIORESAL) tablet 10 mg  10 mg Oral QHS Elodia Florence., MD   10 mg at 02/05/21 2210  . citalopram (CELEXA) tablet 10 mg  10 mg Oral Daily Elodia Florence., MD   10 mg at 02/06/21 0956  . enoxaparin (LOVENOX) injection 40 mg  40 mg Subcutaneous Daily Swinteck, Aaron Edelman, MD   40 mg at 02/06/21 1037  . hydrOXYzine (ATARAX/VISTARIL) tablet 25 mg  25 mg Oral QHS Elodia Florence., MD   25 mg at 02/05/21 2210  . lactated ringers infusion   Intravenous Continuous Rod Can, MD   Stopped at 02/06/21 [phone]  . levothyroxine (SYNTHROID) tablet 125 mcg  125 mcg Oral Q0600 Elodia Florence., MD   125 mcg at 02/06/21 0543  . morphine 2 MG/ML injection 0.5 mg  0.5 mg Intravenous Q2H PRN Tu, Ching T, DO   0.5 mg at 02/04/21 0016  . ondansetron (ZOFRAN-ODT) disintegrating tablet 4 mg  4 mg Oral Q8H PRN Elodia Florence., MD   4 mg at 02/04/21 1835  . oxyCODONE (Oxy IR/ROXICODONE) immediate release tablet 2.5 mg  2.5 mg Oral Q4H PRN Elodia Florence., MD       Or  . oxyCODONE (Oxy IR/ROXICODONE) immediate release tablet 5 mg  5 mg Oral Q4H PRN Elodia Florence., MD      . pantoprazole (PROTONIX) EC tablet 40 mg  40 mg Oral BID Elodia Florence., MD   40 mg at 02/06/21 0957  . potassium chloride SA (KLOR-CON) CR tablet 40 mEq  40 mEq Oral Once Arrien, Jimmy Picket, MD      . pravastatin (PRAVACHOL) tablet 10 mg  10 mg Oral Once per day on Thu Powell, Kendra Opitz., MD      . pyridOXINE (VITAMIN B-6) tablet 100 mg  100 mg Oral Daily Elodia Florence., MD   100 mg at 02/06/21 0955  . saccharomyces boulardii (FLORASTOR) capsule 250 mg  250 mg Oral BID Elodia Florence., MD   250 mg at 02/06/21 0955  . senna (SENOKOT) tablet 8.6 mg  1 tablet Oral BID Tu, Ching T, DO   8.6 mg at 02/06/21 0956  . simethicone (MYLICON) chewable tablet 160 mg  160 mg Oral Q8H PRN Elodia Florence., MD      . vitamin B-12 (CYANOCOBALAMIN) tablet 1,000 mcg  1,000 mcg Oral Daily Elodia Florence., MD   1,000 mcg at 02/06/21 0957  . zolpidem (AMBIEN) tablet 2.5 mg  2.5 mg Oral QHS Elodia Florence., MD   2.5 mg at 02/04/21 2155     Discharge Medications: Please see discharge summary for a list of discharge medications.  Relevant Imaging Results:  Relevant Lab Results:  Additional Information SS#: [id number]  Konnor Jorden, Marjie Skiff, RN

## 2021-02-06 NOTE — Progress Notes (Signed)
Pt continues to refuse cpap qhs.

## 2021-02-06 NOTE — Progress Notes (Signed)
PROGRESS NOTE    Melinda Kerr  EGB:[id number] DOB: [birth year] DOA: 02/03/2021 PCP: Maury Dus, MD    Brief Narrative:  This 86 years old female with PMH significant for hypertension, coronary artery disease, CVA, OSA, dyslipidemia, endometrial cancer, history of DVT who presented to the ED s/p mechanical fall.  Patient was sitting in her power lift chair when she slipped out and fell on her left hip.  X-ray of left hip showed acute fracture of proximal left femur.  Orthopedic consulted,  patient underwent open reduction internal fixation.  Assessment & Plan:   Principal Problem:   Femur fracture, left (HCC) Active Problems:   OSA on CPAP   Essential hypertension   Prolonged QT interval   History of CVA (cerebrovascular accident)   Left proximal femur fracture; Patient presented status post fall with left femur fracture. Orthopedic consulted, patient underwent open reduction internal fixation. Ortho recommended, weightbearing as tolerated,  adequate pain control. PT and OT evaluation recommended SNF.   Hypertension: Continue home blood pressure medications.  Diastolic CHF: No signs of decompensation, continue home medications.  Depression: Continue Celexa.  No behavioral problems  Hypothyroidism : continue levothyroxine  Osteoarthritis/ gout :  No acute flare continue allopurinol and baclofen  Hypomagnesemia and hypokalemia :  replaced and improved.   DVT prophylaxis: Lovenox Code Status: DNR Family Communication: No family at bedside Disposition Plan:   Status is: Inpatient  Remains inpatient appropriate because:Inpatient level of care appropriate due to severity of illness   Dispo: The patient is from: Home              Anticipated d/c is to: SNF              Patient currently is not medically stable to d/c.   Difficult to place patient No  Consultants:  Orthopedics  Procedures: ORIF Antimicrobials: Anti-infectives (From admission, onward)   Start      Dose/Rate Route Frequency Ordered Stop   02/05/21 1400  vancomycin (VANCOREADY) IVPB 1500 mg/300 mL        1,500 mg 150 mL/hr over 120 Minutes Intravenous On call to O.R. 02/05/21 1115 02/05/21 2213      Subjective: Patient was seen and examined at bedside.  No overnight events.  She is lying comfortably on the bed. she is a s/p left ORIF postoperative day 1. Patient developed urinary retention and failed voiding trial.  Objective: Vitals:   02/06/21 0002 02/06/21 0408 02/06/21 0954 02/06/21 1402  BP: 101/71 (!) 106/54 95/74 (!) 131/97  Pulse: 71 64 69 77  Resp: 14 14 18 18   Temp: 98.2 F (36.8 C) (!) 97.5 F (36.4 C) 98.8 F (37.1 C) 99 F (37.2 C)  TempSrc: Oral Oral Oral Oral  SpO2: 99% 99% 100% 100%  Weight:      Height:        Intake/Output Summary (Last 24 hours) at 02/06/2021 1531 Last data filed at 02/06/2021 1400 Gross per 24 hour  Intake 1640 ml  Output 1000 ml  Net 640 ml   Filed Weights   02/03/21 1615 02/04/21 1128  Weight: 111.1 kg 110.1 kg    Examination:  General exam: Appears calm and comfortable, not in any acute distress. Respiratory system: Clear to auscultation. Respiratory effort normal. Cardiovascular system: S1 & S2 heard, RRR. No JVD, murmurs, rubs, gallops or clicks. No pedal edema. Gastrointestinal system: Abdomen is nondistended, soft and nontender. No organomegaly or masses felt. Normal bowel sounds heard. Central nervous system: Alert and oriented.  No focal neurological deficits. Extremities: Left lower extremity tenderness, no swelling. Skin: No rashes, lesions or ulcers Psychiatry: Judgement and insight appear normal. Mood & affect appropriate.     Data Reviewed: I have personally reviewed following labs and imaging studies  CBC: Recent Labs  Lab 02/03/21 1635 02/04/21 0335 02/05/21 0524  WBC 9.0 9.1 7.7  NEUTROABS 6.6 5.3 5.0  HGB 15.1* 13.9 12.7  HCT 46.9* 44.6 39.6  MCV 96.1 99.6 98.3  PLT 359 311 073   Basic  Metabolic Panel: Recent Labs  Lab 02/03/21 1635 02/04/21 0335 02/05/21 0524 02/06/21 0522  NA 138 139 137 141  K 3.9 3.5 3.5 4.0  CL 98 100 100 102  CO2 27 33* 30 33*  GLUCOSE 191* 158* 233* 148*  BUN 14 14 19 18   CREATININE 0.66 0.70 0.60 0.82  CALCIUM 10.2 9.8 9.8 10.0  MG  --   --  1.6*  --   PHOS  --   --  3.6  --    GFR: Estimated Creatinine Clearance: 57.5 mL/min (by C-G formula based on SCr of 0.82 mg/dL). Liver Function Tests: Recent Labs  Lab 02/05/21 0524  AST 24  ALT 18  ALKPHOS 102  BILITOT 0.5  PROT 5.3*  ALBUMIN 2.9*   No results for input(s): LIPASE, AMYLASE in the last 168 hours. No results for input(s): AMMONIA in the last 168 hours. Coagulation Profile: Recent Labs  Lab 02/03/21 1635  INR 1.0   Cardiac Enzymes: No results for input(s): CKTOTAL, CKMB, CKMBINDEX, TROPONINI in the last 168 hours. BNP (last 3 results) No results for input(s): PROBNP in the last 8760 hours. HbA1C: No results for input(s): HGBA1C in the last 72 hours. CBG: Recent Labs  Lab 02/05/21 1931  GLUCAP 182*   Lipid Profile: No results for input(s): CHOL, HDL, LDLCALC, TRIG, CHOLHDL, LDLDIRECT in the last 72 hours. Thyroid Function Tests: Recent Labs    02/04/21 0823  TSH 2.473   Anemia Panel: No results for input(s): VITAMINB12, FOLATE, FERRITIN, TIBC, IRON, RETICCTPCT in the last 72 hours. Sepsis Labs: No results for input(s): PROCALCITON, LATICACIDVEN in the last 168 hours.  Recent Results (from the past 240 hour(s))  SARS CORONAVIRUS 2 (TAT 6-24 HRS) Nasopharyngeal Nasopharyngeal Swab     Status: None   Collection Time: 02/03/21  6:56 PM   Specimen: Nasopharyngeal Swab  Result Value Ref Range Status   SARS Coronavirus 2 NEGATIVE NEGATIVE Final    Comment: (NOTE) SARS-CoV-2 target nucleic acids are NOT DETECTED.  The SARS-CoV-2 RNA is generally detectable in upper and lower respiratory specimens during the acute phase of infection. Negative results do  not preclude SARS-CoV-2 infection, do not rule out co-infections with other pathogens, and should not be used as the sole basis for treatment or other patient management decisions. Negative results must be combined with clinical observations, patient history, and epidemiological information. The expected result is Negative.  Fact Sheet for Patients: SugarRoll.be  Fact Sheet for Healthcare Providers: https://www.woods-mathews.com/  This test is not yet approved or cleared by the [place] FDA and  has been authorized for detection and/or diagnosis of SARS-CoV-2 by FDA under an Emergency Use Authorization (EUA). This EUA will remain  in effect (meaning this test can be used) for the duration of the COVID-19 declaration under Se ction 564(b)(1) of the Act, 21 U.S.C. section 360bbb-3(b)(1), unless the authorization is terminated or revoked sooner.  Performed at [facility], [place] [address], [place], [place] [address]  Surgical pcr screen     Status: None   Collection Time: 02/03/21  8:43 PM   Specimen: Nasal Mucosa; Nasal Swab  Result Value Ref Range Status   MRSA, PCR NEGATIVE NEGATIVE Final   Staphylococcus aureus NEGATIVE NEGATIVE Final    Comment: (NOTE) The Xpert SA Assay (FDA approved for NASAL specimens in patients 58 years of age and older), is one component of a comprehensive surveillance program. It is not intended to diagnose infection nor to guide or monitor treatment. Performed at [facility], [place] [address], [place], [place] [address]          Radiology Studies: Pelvis Portable  Result Date: 02/05/2021 CLINICAL DATA:  Left hip arthroplasty. EXAM: PORTABLE PELVIS 1-2 VIEWS COMPARISON:  Radiograph 02/03/2021 FINDINGS: Left hip arthroplasty in expected alignment. No periprosthetic lucency. Recent postsurgical change includes air and edema in the soft tissues with lateral skin staples.  Postsurgical change of the right proximal femur, intact hardware where visualized. Bones diffusely under mineralized. IMPRESSION: Left hip arthroplasty without immediate postoperative complication. Electronically Signed   By: Keith Rake M.D.   On: 02/05/2021 20:20   DG C-Arm 1-60 Min-No Report  Result Date: 02/05/2021 CLINICAL DATA:  Left hemiarthroplasty. EXAM: OPERATIVE LEFT HIP (WITH PELVIS IF PERFORMED) TECHNIQUE: Fluoroscopic spot image(s) were submitted for interpretation post-operatively. COMPARISON:  Radiograph 02/03/2021 FINDINGS: Two fluoroscopic spot views obtained in the operating room of the pelvis and left hip. Left hip arthroplasty in expected alignment. Fluoroscopy time 6 seconds. Dose 1.2470 mGy. IMPRESSION: Procedural fluoroscopy for left hip arthroplasty. Electronically Signed   By: Keith Rake M.D.   On: 02/05/2021 18:35   DG HIP OPERATIVE UNILAT W OR W/O PELVIS LEFT  Result Date: 02/05/2021 CLINICAL DATA:  Left hemiarthroplasty. EXAM: OPERATIVE LEFT HIP (WITH PELVIS IF PERFORMED) TECHNIQUE: Fluoroscopic spot image(s) were submitted for interpretation post-operatively. COMPARISON:  Radiograph 02/03/2021 FINDINGS: Two fluoroscopic spot views obtained in the operating room of the pelvis and left hip. Left hip arthroplasty in expected alignment. Fluoroscopy time 6 seconds. Dose 1.2470 mGy. IMPRESSION: Procedural fluoroscopy for left hip arthroplasty. Electronically Signed   By: Keith Rake M.D.   On: 02/05/2021 18:35        Scheduled Meds: . acetaminophen  1,000 mg Oral Q8H  . allopurinol  300 mg Oral Daily  . baclofen  10 mg Oral QHS  . citalopram  10 mg Oral Daily  . enoxaparin (LOVENOX) injection  40 mg Subcutaneous Daily  . hydrOXYzine  25 mg Oral QHS  . levothyroxine  125 mcg Oral Q0600  . pantoprazole  40 mg Oral BID  . potassium chloride  40 mEq Oral Once  . pravastatin  10 mg Oral Once per day on Thu  . pyridOXINE  100 mg Oral Daily  . saccharomyces  boulardii  250 mg Oral BID  . senna  1 tablet Oral BID  . vitamin B-12  1,000 mcg Oral Daily  . zolpidem  2.5 mg Oral QHS   Continuous Infusions: . lactated ringers Stopped (02/06/21 0546)     LOS: 3 days    Time spent: 25 mins    Melinda Speers, MD [facility]   If 7PM-7AM, please contact night-coverage

## 2021-02-06 NOTE — Progress Notes (Signed)
    Subjective:  Patient reports pain as mild to moderate.  Denies N/V/CP/SOB.   Objective:   VITALS:   Vitals:   02/05/21 2200 02/05/21 2259 02/06/21 0002 02/06/21 0408  BP: 119/64 104/70 101/71 (!) 106/54  Pulse: 76 73 71 64  Resp: 14 14 14 14   Temp: 97.9 F (36.6 C) 97.9 F (36.6 C) 98.2 F (36.8 C) (!) 97.5 F (36.4 C)  TempSrc: Oral Oral Oral Oral  SpO2: 99% 99% 99% 99%  Weight:      Height:        NAD ABD soft Neurovascular intact Sensation intact distally Intact pulses distally Dorsiflexion/Plantar flexion intact Incision: Prevena dressing connected to house vac and functioning properly   Lab Results  Component Value Date   WBC 7.7 02/05/2021   HGB 12.7 02/05/2021   HCT 39.6 02/05/2021   MCV 98.3 02/05/2021   PLT 312 02/05/2021   BMET    Component Value Date/Time   NA 141 02/06/2021 0522   NA 138 03/01/2017 1325   K 4.0 02/06/2021 0522   K 4.2 03/01/2017 1325   CL 102 02/06/2021 0522   CO2 33 (H) 02/06/2021 0522   CO2 27 03/01/2017 1325   GLUCOSE 148 (H) 02/06/2021 0522   GLUCOSE 135 03/01/2017 1325   BUN 18 02/06/2021 0522   BUN 22.5 03/01/2017 1325   CREATININE 0.82 02/06/2021 0522   CREATININE 0.9 03/01/2017 1325   CALCIUM 10.0 02/06/2021 0522   CALCIUM 10.1 10/29/2018 1007   CALCIUM 10.4 03/01/2017 1325   GFRNONAA >60 02/06/2021 0522   GFRAA >60 05/11/2020 0429     Assessment/Plan: 1 Day Post-Op   Principal Problem:   Femur fracture, left (HCC) Active Problems:   OSA on CPAP   Essential hypertension   Prolonged QT interval   History of CVA (cerebrovascular accident)   WBAT with walker DVT ppx: Aspirin, SCDs, TEDS PO pain control PT/OT Prevena should be disconnected from house vacuum and connected to portable Prevena unit at discharge (unit in the room) Dispo: Planning Follow up one week post discharge with Dr.[facility]te[facility] clinic for removal of Prevena dressing    Dorothyann Peng 02/06/2021, 8:24 AM [facility] now [facility] [address], [place], [place] [address] Phone: [phone] [email or web address] Facebook  [employer]

## 2021-02-06 NOTE — Care Management Important Message (Signed)
Important Message  Patient Details IM Letter given to the Patient. Name: Melinda Kerr MRN: [id number] Date of Birth: [birth year]   Medicare Important Message Given:  Yes     Kerin Salen 02/06/2021, 10:59 AM

## 2021-02-07 LAB — CBC
HCT: 35.9 % — ABNORMAL LOW (ref 36.0–46.0)
Hemoglobin: 11.6 g/dL — ABNORMAL LOW (ref 12.0–15.0)
MCH: 31.5 pg (ref 26.0–34.0)
MCHC: 32.3 g/dL (ref 30.0–36.0)
MCV: 97.6 fL (ref 80.0–100.0)
Platelets: 245 10*3/uL (ref 150–400)
RBC: 3.68 MIL/uL — ABNORMAL LOW (ref 3.87–5.11)
RDW: 13.2 % (ref 11.5–15.5)
WBC: 12.7 10*3/uL — ABNORMAL HIGH (ref 4.0–10.5)
nRBC: 0 % (ref 0.0–0.2)

## 2021-02-07 LAB — BASIC METABOLIC PANEL
Anion gap: 3 — ABNORMAL LOW (ref 5–15)
BUN: 17 mg/dL (ref 8–23)
CO2: 33 mmol/L — ABNORMAL HIGH (ref 22–32)
Calcium: 9.9 mg/dL (ref 8.9–10.3)
Chloride: 100 mmol/L (ref 98–111)
Creatinine, Ser: 0.77 mg/dL (ref 0.44–1.00)
GFR, Estimated: >60 mL/min (ref >60)
Glucose, Bld: 177 mg/dL — ABNORMAL HIGH (ref 70–99)
Potassium: 3.4 mmol/L — ABNORMAL LOW (ref 3.5–5.1)
Sodium: 136 mmol/L (ref 135–145)

## 2021-02-07 LAB — RESP PANEL BY RT-PCR (FLU A&B, COVID) ARPGX2
Influenza A by PCR: NEGATIVE
Influenza B by PCR: NEGATIVE
SARS Coronavirus 2 by RT PCR: NEGATIVE

## 2021-02-07 LAB — MAGNESIUM: Magnesium: 1.7 mg/dL (ref 1.7–2.4)

## 2021-02-07 LAB — PHOSPHORUS: Phosphorus: 2.7 mg/dL (ref 2.5–4.6)

## 2021-02-07 MED ORDER — POTASSIUM CHLORIDE 20 MEQ PO PACK
40.0000 meq | PACK | Freq: Once | ORAL | Status: AC
  Administered 2021-02-07: 40 meq via ORAL
  Filled 2021-02-07: qty 2

## 2021-02-07 NOTE — Discharge Summary (Addendum)
Physician Discharge Summary  OLIVEAH ZWACK WVP:[id number] DOB: Apr 16, 1935 DOA: 02/03/2021  PCP: Maury Dus, MD  Admit date: 02/03/2021.  Discharge date: 02/07/2021  Admitted From: SNF  Disposition: SNF ( [facility])  Recommendations for Outpatient Follow-up:  1. Follow up with PCP in 1-2 weeks. 2. Please obtain BMP/CBC in one week. 3. Advised to follow-up with orthopedics as scheduled.   4. Advised to reduce benzodiazepines. 5. Advised weightbearing as tolerated.  Home Health: None. Equipment/Devices: None.  Discharge Condition: Stable CODE STATUS:DNR Diet recommendation: Heart Healthy   Brief Summary / Hospital course: This 86 years old female with PMH significant for hypertension, coronary artery disease, CVA, OSA on CPAP, dyslipidemia, endometrial cancer, history of DVT who presented to the ED s/p mechanical fall.  Patient was sitting in her power lift chair and lowered it too much to where she slipped out and fell on her left hip. In the ED she was in a lot of pain,   X-ray of left hip showed acute fracture of proximal left femur.  Orthopedics was consulted,  patient underwent open reduction internal fixation(left hip hemiarthroplasty) tolerated well.  Patient has been doing fine.  Pain is better controlled.  PT recommended a skilled nursing facility for rehab.  Patient was admitted from [facility], and patient is being discharged to [facility] for rehab.  Ortho recommended weightbearing as tolerated with walker, aspirin 81 mg twice daily, SCDs for DVT prophylaxis,  continue PT and OT.  Portable Prevena wound VAC at discharge and follow-up with Dr. Lyla Glassing clinic for removal of Prevena dressing in 1 week.  She was managed for below problems in the hospital.   Discharge Diagnoses:  Principal Problem:   Femur fracture, left (HCC) Active Problems:   OSA on CPAP   Essential hypertension   Prolonged QT interval   History of CVA (cerebrovascular accident)  Left proximal femur  fracture; Patient presented status post fall with left femur fracture. Orthopedic consulted, patient underwent open reduction internal fixation. Ortho recommended, weightbearing as tolerated,  adequate pain control. PT and OT evaluation recommended SNF.  Hypertension: Continue home blood pressure medications.  Diastolic CHF: No signs of decompensation, continue home medications.  Depression: Continue Celexa.  No behavioral problems.  Hypothyroidism : continue levothyroxine  Osteoarthritis/ gout :  No acute flare,  continue allopurinol and baclofen  Hypomagnesemia and hypokalemia :  replaced and improved.   Discharge Instructions  Discharge Instructions    Call MD for:  difficulty breathing, headache or visual disturbances   Complete by: As directed    Call MD for:  persistant dizziness or light-headedness   Complete by: As directed    Call MD for:  persistant nausea and vomiting   Complete by: As directed    Diet - low sodium heart healthy   Complete by: As directed    Diet Carb Modified   Complete by: As directed    Discharge instructions   Complete by: As directed    Advised to follow-up with primary care physician in 1 week. Advised to follow-up with orthopedic as scheduled.   Advised to reduce benzodiazepines. Advised weightbearing as tolerated.   Discharge wound care:   Complete by: As directed    Follow [facility] for wound care.   Increase activity slowly   Complete by: As directed      Allergies as of 02/07/2021      Reactions   Oxybutynin Other (See Comments)   Bleeding gums, felt sick      Medication List  STOP taking these medications   enoxaparin 40 MG/0.4ML injection Commonly known as: LOVENOX   HYDROcodone-acetaminophen 5-325 MG tablet Commonly known as: NORCO/VICODIN   pravastatin 10 MG tablet Commonly known as: PRAVACHOL   zolpidem 10 MG tablet Commonly known as: AMBIEN     TAKE these medications   acetaminophen 500 MG  tablet Commonly known as: TYLENOL Take 2 tablets (1,000 mg total) by mouth every 8 (eight) hours. What changed:   medication strength  how much to take  when to take this  reasons to take this   albuterol 108 (90 Base) MCG/ACT inhaler Commonly known as: VENTOLIN HFA Inhale 2 puffs into the lungs every 8 (eight) hours as needed for wheezing.   allopurinol 300 MG tablet Commonly known as: ZYLOPRIM Take 300 mg by mouth daily.   aspirin EC 81 MG tablet Take 1 tablet (81 mg total) by mouth 2 (two) times daily with a meal. Swallow whole. What changed: when to take this   baclofen 10 MG tablet Commonly known as: LIORESAL Take 1 tablet (10 mg total) by mouth 3 (three) times daily as needed for muscle spasms. What changed: when to take this   calcium carbonate 500 MG chewable tablet Commonly known as: TUMS - dosed in mg elemental calcium Chew 1 tablet by mouth daily.   cetirizine 10 MG tablet Commonly known as: ZYRTEC Take 10 mg by mouth daily.   citalopram 10 MG tablet Commonly known as: CELEXA Take 10 mg by mouth daily.   diclofenac Sodium 1 % Gel Commonly known as: VOLTAREN Apply 2 g topically every 12 (twelve) hours as needed (pain).   docusate sodium 100 MG capsule Commonly known as: COLACE Take 1 capsule (100 mg total) by mouth 2 (two) times daily.   Fish Oil 1000 MG Caps Take 1,000 mg by mouth daily.   furosemide 40 MG tablet Commonly known as: LASIX Take 0.5 tablets (20 mg total) by mouth daily. What changed: how much to take   hydrOXYzine 25 MG tablet Commonly known as: ATARAX/VISTARIL Take 25 mg by mouth at bedtime.   iron polysaccharides 150 MG capsule Commonly known as: NIFEREX Take 150 mg by mouth daily.   levothyroxine 125 MCG tablet Commonly known as: SYNTHROID Take 125 mcg by mouth daily at 6 (six) AM.   metoCLOPramide 5 MG tablet Commonly known as: REGLAN Take 5 mg by mouth 2 (two) times daily at 8 am and 10 pm.   nystatin  powder Commonly known as: MYCOSTATIN/NYSTOP Apply 1 Bottle topically See admin instructions. Apply topically to abdominal skin folds and under bilateral breast for intertrigo - twice daily - please make sure skin has been cleaned and is dry before applying   oxyCODONE 5 MG immediate release tablet Commonly known as: Oxy IR/ROXICODONE Take 1 tablet (5 mg total) by mouth every 4 (four) hours as needed for up to 7 days for severe pain.   pantoprazole 40 MG tablet Commonly known as: PROTONIX Take 1 tablet (40 mg total) by mouth 2 (two) times daily before a meal.   polyethylene glycol 17 g packet Commonly known as: MIRALAX / GLYCOLAX Take 17 g by mouth daily as needed for mild constipation or moderate constipation. What changed: when to take this   pyridOXINE 100 MG tablet Commonly known as: VITAMIN B-6 Take 100 mg by mouth daily.   saccharomyces boulardii 250 MG capsule Commonly known as: FLORASTOR Take 250 mg by mouth 2 (two) times daily.   senna-docusate 8.6-50 MG tablet Commonly  known as: Senokot-S Take 1 tablet by mouth daily.   Simethicone 250 MG Caps Take 250 mg by mouth every 8 (eight) hours as needed (gas).   spironolactone 25 MG tablet Commonly known as: ALDACTONE Take 25 mg by mouth daily.   vitamin B-12 1000 MCG tablet Commonly known as: CYANOCOBALAMIN Take 1,000 mcg by mouth daily.   vitamin C 500 MG tablet Commonly known as: ASCORBIC ACID Take 500 mg by mouth daily.   Vitamin D (Ergocalciferol) 1.25 MG (50000 UNIT) Caps capsule Commonly known as: DRISDOL Take 50,000 Units by mouth every 7 (seven) days. Monday            Discharge Care Instructions  (From admission, onward)         Start     Ordered   02/07/21 0000  Discharge wound care:       Comments: [facility] for wound care.   02/07/21 1151          Contact information for follow-up providers    Swinteck, Aaron Edelman, MD. Schedule an appointment as soon as possible for a visit in 1  week.   Specialty: Orthopedic Surgery Why: [facility] removal Contact information: [address] [place] [address] [phone]            Contact information for after-discharge care    Destination    [facility]erred [facility]F .   Service: Skilled Nursing Contact information: [place] [phone]                 Allergies  Allergen Reactions  . Oxybutynin Other (See Comments)    Bleeding gums, felt sick    Consultations:  [facility]   Procedures/Studies: Pelvis Portable  Result Date: 02/05/2021 CLINICAL DATA:  Left hip arthroplasty. EXAM: PORTABLE PELVIS 1-2 VIEWS COMPARISON:  Radiograph 02/03/2021 FINDINGS: Left hip arthroplasty in expected alignment. No periprosthetic lucency. Recent postsurgical change includes air and edema in the soft tissues with lateral skin staples. Postsurgical change of the right proximal femur, intact hardware where visualized. Bones diffusely under mineralized. IMPRESSION: Left hip arthroplasty without immediate postoperative complication. Electronically Signed   By: Keith Rake M.D.   On: 02/05/2021 20:20   CT FEMUR LEFT WO CONTRAST  Result Date: 01/28/2021 CLINICAL DATA:  Complains of left hip and left knee pain status post fall 3 weeks ago EXAM: CT OF THE LOWER LEFT EXTREMITY WITHOUT CONTRAST TECHNIQUE: Multidetector CT imaging of the lower left extremity was performed according to the standard protocol. COMPARISON:  Right femur radiograph May 09, 2020 utilized for comparison of topogram, CT abdomen pelvis 07/13/2017 FINDINGS: Bones/Joint/Cartilage There is a subcapital left femoral neck fracture with mild displacement and varus angulation. There is mild sclerosis of the femoral head similar to prior CT abdomen pelvis in October 2018. No definite AVN at this time. There is adjacent periosteal reaction suggesting this is a subacute fracture. There is a  left total knee arthroplasty with varus alignment and rotation with lateral joint space widening. There is pubic symphysis irregularity compatible with osteitis pubis. Ligaments Suboptimally assessed by CT. Muscles and Tendons There is mild left lower extremity muscle atrophy. Soft tissues There is subcutaneous edema most prominent laterally. Other: Topogram demonstrates right femur intramedullary fixation with unchanged frontal project alignment of the distal femur fracture, lateral plate screw fixation hardware, and right total knee arthroplasty. IMPRESSION: Subacute, subcapital left femoral neck fracture with mild displacement and varus angulation. Varus alignment and rotation of  the left total knee arthroplasty with lateral joint space widening, which could be seen with ligamentous injury. Recommend orthopedic referral and left knee radiographs. These results will be called to the ordering clinician or representative by the Radiologist Assistant, and communication documented in the PACS or [employer]. Electronically Signed   By: Caprice Renshaw   On: 01/28/2021 11:04   DG C-Arm 1-60 Min-No Report  Result Date: 02/05/2021 CLINICAL DATA:  Left hemiarthroplasty. EXAM: OPERATIVE LEFT HIP (WITH PELVIS IF PERFORMED) TECHNIQUE: Fluoroscopic spot image(s) were submitted for interpretation post-operatively. COMPARISON:  Radiograph 02/03/2021 FINDINGS: Two fluoroscopic spot views obtained in the operating room of the pelvis and left hip. Left hip arthroplasty in expected alignment. Fluoroscopy time 6 seconds. Dose 1.2470 mGy. IMPRESSION: Procedural fluoroscopy for left hip arthroplasty. Electronically Signed   By: Narda Rutherford M.D.   On: 02/05/2021 18:35   ECHOCARDIOGRAM COMPLETE  Result Date: 02/04/2021    ECHOCARDIOGRAM REPORT   Patient Name:   Melinda Kerr Date of Exam: 02/04/2021 Medical Rec #:  [id number]       Height:       60.0 in Accession #:    [id number]      Weight:       245.0 lb Date of Birth:   Nov 09, 1934        BSA:          2.035 m Patient Age:    86 years        BP:           128/79 mmHg Patient Gender: F               HR:           71 bpm. Exam Location:  Inpatient Procedure: 2D Echo, Color Doppler and Cardiac Doppler Indications:    I48.91* Unspeicified atrial fibrillation  History:        Patient has prior history of Echocardiogram examinations, most                 recent 05/15/2019. CHF, Signs/Symptoms:Dyspnea; Risk                 Factors:Diabetes, Hypertension and Dyslipidemia.  Sonographer:    Eulah Pont RDCS Referring Phys: [phone] A CALDWELL POWELL JR IMPRESSIONS  1. Left ventricular ejection fraction, by estimation, is 60 to 65%. The left ventricle has normal function. The left ventricle has no regional wall motion abnormalities. Left ventricular diastolic parameters are consistent with Grade II diastolic dysfunction (pseudonormalization).  2. Right ventricular systolic function is normal. The right ventricular size is normal. There is mildly elevated pulmonary artery systolic pressure.  3. Left atrial size was mildly dilated.  4. The mitral valve is normal in structure. No evidence of mitral valve regurgitation. No evidence of mitral stenosis.  5. The aortic valve is tricuspid. Aortic valve regurgitation is not visualized. Mild aortic valve sclerosis is present, with no evidence of aortic valve stenosis.  6. The inferior vena cava is normal in size with greater than 50% respiratory variability, suggesting right atrial pressure of 3 mmHg. FINDINGS  Left Ventricle: Left ventricular ejection fraction, by estimation, is 60 to 65%. The left ventricle has normal function. The left ventricle has no regional wall motion abnormalities. The left ventricular internal cavity size was normal in size. There is  no left ventricular hypertrophy. Left ventricular diastolic parameters are consistent with Grade II diastolic dysfunction (pseudonormalization). Right Ventricle: The right ventricular size is  normal. No increase in right ventricular  wall thickness. Right ventricular systolic function is normal. There is mildly elevated pulmonary artery systolic pressure. The tricuspid regurgitant velocity is 2.72  m/s, and with an assumed right atrial pressure of 5 mmHg, the estimated right ventricular systolic pressure is [id number] mmHg. Left Atrium: Left atrial size was mildly dilated. Right Atrium: Right atrial size was normal in size. Pericardium: There is no evidence of pericardial effusion. Presence of pericardial fat pad. Mitral Valve: The mitral valve is normal in structure. Mild mitral annular calcification. No evidence of mitral valve regurgitation. No evidence of mitral valve stenosis. Tricuspid Valve: The tricuspid valve is normal in structure. Tricuspid valve regurgitation is not demonstrated. No evidence of tricuspid stenosis. Aortic Valve: The aortic valve is tricuspid. Aortic valve regurgitation is not visualized. Mild aortic valve sclerosis is present, with no evidence of aortic valve stenosis. Pulmonic Valve: The pulmonic valve was normal in structure. Pulmonic valve regurgitation is not visualized. No evidence of pulmonic stenosis. Aorta: The aortic root is normal in size and structure. Venous: The inferior vena cava was not well visualized. The inferior vena cava is normal in size with greater than 50% respiratory variability, suggesting right atrial pressure of 3 mmHg. IAS/Shunts: No atrial level shunt detected by color flow Doppler.  LEFT VENTRICLE PLAX 2D LVIDd:         3.80 cm  Diastology LVIDs:         2.40 cm  LV e' medial:    5.52 cm/s LV PW:         0.90 cm  LV E/e' medial:  16.2 LV IVS:        1.00 cm  LV e' lateral:   7.71 cm/s LVOT diam:     1.90 cm  LV E/e' lateral: 11.6 LV SV:         71 LV SV Index:   35 LVOT Area:     2.84 cm  RIGHT VENTRICLE RV S prime:     13.80 cm/s TAPSE (M-mode): 2.1 cm LEFT ATRIUM             Index       RIGHT ATRIUM           Index LA diam:        4.16 cm 2.04 cm/m   RA Area:     14.60 cm LA Vol (A2C):   55.4 ml 27.23 ml/m RA Volume:   36.00 ml  17.69 ml/m LA Vol (A4C):   50.1 ml 24.62 ml/m LA Biplane Vol: 54.4 ml 26.74 ml/m  AORTIC VALVE LVOT Vmax:   104.00 cm/s LVOT Vmean:  82.500 cm/s LVOT VTI:    0.252 m  AORTA Ao Root diam: 3.30 cm Ao Asc diam:  3.30 cm MITRAL VALVE               TRICUSPID VALVE MV Area (PHT): 1.34 cm    TR Peak grad:   29.6 mmHg MV Decel Time: 567 msec    TR Vmax:        272.00 cm/s MV E velocity: 89.70 cm/s MV A velocity: 78.20 cm/s  SHUNTS MV E/A ratio:  1.15        Systemic VTI:  0.25 m                            Systemic Diam: 1.90 cm Dani Gobble Croitoru MD Electronically signed by Sanda Klein MD Signature Date/Time: 02/04/2021/11:29:07 AM    Final  DG HIP OPERATIVE UNILAT W OR W/O PELVIS LEFT  Result Date: 02/05/2021 CLINICAL DATA:  Left hemiarthroplasty. EXAM: OPERATIVE LEFT HIP (WITH PELVIS IF PERFORMED) TECHNIQUE: Fluoroscopic spot image(s) were submitted for interpretation post-operatively. COMPARISON:  Radiograph 02/03/2021 FINDINGS: Two fluoroscopic spot views obtained in the operating room of the pelvis and left hip. Left hip arthroplasty in expected alignment. Fluoroscopy time 6 seconds. Dose 1.2470 mGy. IMPRESSION: Procedural fluoroscopy for left hip arthroplasty. Electronically Signed   By: Keith Rake M.D.   On: 02/05/2021 18:35   DG Hip Unilat With Pelvis 2-3 Views Left  Result Date: 02/03/2021 CLINICAL DATA:  Left hip pain. EXAM: DG HIP (WITH OR WITHOUT PELVIS) 2-3V LEFT COMPARISON:  None. FINDINGS: An acute fracture deformity is seen extending through the neck of the proximal left femur. Approximately 1/2 shaft width superior and lateral displacement of the distal fracture site is seen. There is no evidence of dislocation. A radiopaque intramedullary rod and compression screw device are seen within the proximal right femur. Degenerative changes are seen involving both hips in the form of joint space narrowing and  acetabular sclerosis. Benign-appearing sclerosis is seen along the neck of the proximal right femur. Multiple phleboliths are noted within the pelvis. IMPRESSION: 1. Acute fracture of the proximal left femur. 2. Prior open reduction internal fixation of the proximal right femur. Electronically Signed   By: Virgina Norfolk M.D.   On: 02/03/2021 17:11   (Open reduction and internal fixation)   Subjective: Patient was seen and examined at bedside.  Overnight events noted,  Patient reports feeling much better,  Patient reports pain is controlled,  Patient is being discharged to skilled nursing facility for rehab.  Discharge Exam: Vitals:   02/07/21 0011 02/07/21 0428  BP: (!) 95/57 (!) 145/97  Pulse: 62 64  Resp: 16 16  Temp: 99.2 F (37.3 C) 98.6 F (37 C)  SpO2: 100% 99%   Vitals:   02/06/21 1402 02/06/21 2034 02/07/21 0011 02/07/21 0428  BP: (!) 131/97 109/88 (!) 95/57 (!) 145/97  Pulse: 77 77 62 64  Resp: 18 18 16 16   Temp: 99 F (37.2 C) 99.8 F (37.7 C) 99.2 F (37.3 C) 98.6 F (37 C)  TempSrc: Oral Oral Oral Oral  SpO2: 100% 99% 100% 99%  Weight:      Height:        General: Pt is alert, awake, not in acute distress Cardiovascular: RRR, S1/S2 +, no rubs, no gallops Respiratory: CTA bilaterally, no wheezing, no rhonchi Abdominal: Soft, NT, ND, bowel sounds + Extremities: no edema, no cyanosis    The results of significant diagnostics from this hospitalization (including imaging, microbiology, ancillary and laboratory) are listed below for reference.     Microbiology: Recent Results (from the past 240 hour(s))  SARS CORONAVIRUS 2 (TAT 6-24 HRS) Nasopharyngeal Nasopharyngeal Swab     Status: None   Collection Time: 02/03/21  6:56 PM   Specimen: Nasopharyngeal Swab  Result Value Ref Range Status   SARS Coronavirus 2 NEGATIVE NEGATIVE Final    Comment: (NOTE) SARS-CoV-2 target nucleic acids are NOT DETECTED.  The SARS-CoV-2 RNA is generally detectable in upper  and lower respiratory specimens during the acute phase of infection. Negative results do not preclude SARS-CoV-2 infection, do not rule out co-infections with other pathogens, and should not be used as the sole basis for treatment or other patient management decisions. Negative results must be combined with clinical observations, patient history, and epidemiological information. The expected result is Negative.  Fact Sheet for Patients: SugarRoll.be  Fact Sheet for Healthcare Providers: https://www.woods-mathews.com/  This test is not yet approved or cleared by the [place] FDA and  has been authorized for detection and/or diagnosis of SARS-CoV-2 by FDA under an Emergency Use Authorization (EUA). This EUA will remain  in effect (meaning this test can be used) for the duration of the COVID-19 declaration under Se ction 564(b)(1) of the Act, 21 U.S.C. section 360bbb-3(b)(1), unless the authorization is terminated or revoked sooner.  Performed at [facility], [place] [address], [place], [place] [address]   Surgical pcr screen     Status: None   Collection Time: 02/03/21  8:43 PM   Specimen: Nasal Mucosa; Nasal Swab  Result Value Ref Range Status   MRSA, PCR NEGATIVE NEGATIVE Final   Staphylococcus aureus NEGATIVE NEGATIVE Final    Comment: (NOTE) The Xpert SA Assay (FDA approved for NASAL specimens in patients 61 years of age and older), is one component of a comprehensive surveillance program. It is not intended to diagnose infection nor to guide or monitor treatment. Performed at [facility], [place] [address], [place], [place] [address]   Resp Panel by RT-PCR (Flu A&B, Covid) Nasopharyngeal Swab     Status: None   Collection Time: 02/07/21  9:47 AM   Specimen: Nasopharyngeal Swab; Nasopharyngeal(NP) swabs in vial transport medium  Result Value Ref Range Status   SARS Coronavirus 2 by RT PCR NEGATIVE NEGATIVE  Final    Comment: (NOTE) SARS-CoV-2 target nucleic acids are NOT DETECTED.  The SARS-CoV-2 RNA is generally detectable in upper respiratory specimens during the acute phase of infection. The lowest concentration of SARS-CoV-2 viral copies this assay can detect is 138 copies/mL. A negative result does not preclude SARS-Cov-2 infection and should not be used as the sole basis for treatment or other patient management decisions. A negative result may occur with  improper specimen collection/handling, submission of specimen other than nasopharyngeal swab, presence of viral mutation(s) within the areas targeted by this assay, and inadequate number of viral copies(<138 copies/mL). A negative result must be combined with clinical observations, patient history, and epidemiological information. The expected result is Negative.  Fact Sheet for Patients:  EntrepreneurPulse.com.au  Fact Sheet for Healthcare Providers:  IncredibleEmployment.be  This test is no t yet approved or cleared by the [place] FDA and  has been authorized for detection and/or diagnosis of SARS-CoV-2 by FDA under an Emergency Use Authorization (EUA). This EUA will remain  in effect (meaning this test can be used) for the duration of the COVID-19 declaration under Section 564(b)(1) of the Act, 21 U.S.C.section 360bbb-3(b)(1), unless the authorization is terminated  or revoked sooner.       Influenza A by PCR NEGATIVE NEGATIVE Final   Influenza B by PCR NEGATIVE NEGATIVE Final    Comment: (NOTE) The Xpert Xpress SARS-CoV-2/FLU/RSV plus assay is intended as an aid in the diagnosis of influenza from Nasopharyngeal swab specimens and should not be used as a sole basis for treatment. Nasal washings and aspirates are unacceptable for Xpert Xpress SARS-CoV-2/FLU/RSV testing.  Fact Sheet for Patients: EntrepreneurPulse.com.au  Fact Sheet for Healthcare  Providers: IncredibleEmployment.be  This test is not yet approved or cleared by the [place] FDA and has been authorized for detection and/or diagnosis of SARS-CoV-2 by FDA under an Emergency Use Authorization (EUA). This EUA will remain in effect (meaning this test can be used) for the duration of the COVID-19 declaration under Section 564(b)(1) of  the Act, 21 U.S.C. section 360bbb-3(b)(1), unless the authorization is terminated or revoked.  Performed at [facility], [place] [address] [place], [place] [address]      Labs: BNP (last 3 results) No results for input(s): BNP in the last 8760 hours. Basic Metabolic Panel: Recent Labs  Lab 02/03/21 1635 02/04/21 0335 02/05/21 0524 02/06/21 0522 02/07/21 0623  NA 138 139 137 141 136  K 3.9 3.5 3.5 4.0 3.4*  CL 98 100 100 102 100  CO2 27 33* 30 33* 33*  GLUCOSE 191* 158* 233* 148* 177*  BUN 14 14 19 18 17   CREATININE 0.66 0.70 0.60 0.82 0.77  CALCIUM 10.2 9.8 9.8 10.0 9.9  MG  --   --  1.6*  --  1.7  PHOS  --   --  3.6  --  2.7   Liver Function Tests: Recent Labs  Lab 02/05/21 0524  AST 24  ALT 18  ALKPHOS 102  BILITOT 0.5  PROT 5.3*  ALBUMIN 2.9*   No results for input(s): LIPASE, AMYLASE in the last 168 hours. No results for input(s): AMMONIA in the last 168 hours. CBC: Recent Labs  Lab 02/03/21 1635 02/04/21 0335 02/05/21 0524 02/07/21 0623  WBC 9.0 9.1 7.7 12.7*  NEUTROABS 6.6 5.3 5.0  --   HGB 15.1* 13.9 12.7 11.6*  HCT 46.9* 44.6 39.6 35.9*  MCV 96.1 99.6 98.3 97.6  PLT 359 311 312 245   Cardiac Enzymes: No results for input(s): CKTOTAL, CKMB, CKMBINDEX, TROPONINI in the last 168 hours. BNP: Invalid input(s): POCBNP CBG: Recent Labs  Lab 02/05/21 1931  GLUCAP 182*   D-Dimer No results for input(s): DDIMER in the last 72 hours. Hgb A1c No results for input(s): HGBA1C in the last 72 hours. Lipid Profile No results for input(s): CHOL, HDL, LDLCALC,  TRIG, CHOLHDL, LDLDIRECT in the last 72 hours. Thyroid function studies No results for input(s): TSH, T4TOTAL, T3FREE, THYROIDAB in the last 72 hours.  Invalid input(s): FREET3 Anemia work up No results for input(s): VITAMINB12, FOLATE, FERRITIN, TIBC, IRON, RETICCTPCT in the last 72 hours. Urinalysis    Component Value Date/Time   COLORURINE COLORLESS (A) 05/14/2019 1331   APPEARANCEUR CLEAR 05/14/2019 1331   LABSPEC 1.018 05/14/2019 1331   LABSPEC 1.020 05/04/2016 1006   PHURINE 5.0 05/14/2019 1331   GLUCOSEU NEGATIVE 05/14/2019 1331   GLUCOSEU Negative 05/04/2016 1006   HGBUR NEGATIVE 05/14/2019 1331   BILIRUBINUR NEGATIVE 05/14/2019 1331   BILIRUBINUR Negative 05/04/2016 1006   KETONESUR NEGATIVE 05/14/2019 1331   PROTEINUR NEGATIVE 05/14/2019 1331   UROBILINOGEN 0.2 05/04/2016 1006   NITRITE NEGATIVE 05/14/2019 1331   LEUKOCYTESUR NEGATIVE 05/14/2019 1331   LEUKOCYTESUR Small 05/04/2016 1006   Sepsis Labs Invalid input(s): PROCALCITONIN,  WBC,  LACTICIDVEN Microbiology Recent Results (from the past 240 hour(s))  SARS CORONAVIRUS 2 (TAT 6-24 HRS) Nasopharyngeal Nasopharyngeal Swab     Status: None   Collection Time: 02/03/21  6:56 PM   Specimen: Nasopharyngeal Swab  Result Value Ref Range Status   SARS Coronavirus 2 NEGATIVE NEGATIVE Final    Comment: (NOTE) SARS-CoV-2 target nucleic acids are NOT DETECTED.  The SARS-CoV-2 RNA is generally detectable in upper and lower respiratory specimens during the acute phase of infection. Negative results do not preclude SARS-CoV-2 infection, do not rule out co-infections with other pathogens, and should not be used as the sole basis for treatment or other patient management decisions. Negative results must be combined with clinical observations, patient history, and  epidemiological information. The expected result is Negative.  Fact Sheet for Patients: SugarRoll.be  Fact Sheet for Healthcare  Providers: https://www.woods-mathews.com/  This test is not yet approved or cleared by the [place] FDA and  has been authorized for detection and/or diagnosis of SARS-CoV-2 by FDA under an Emergency Use Authorization (EUA). This EUA will remain  in effect (meaning this test can be used) for the duration of the COVID-19 declaration under Se ction 564(b)(1) of the Act, 21 U.S.C. section 360bbb-3(b)(1), unless the authorization is terminated or revoked sooner.  Performed at [facility], [place] [address] [place], [place] [address]   Surgical pcr screen     Status: None   Collection Time: 02/03/21  8:43 PM   Specimen: Nasal Mucosa; Nasal Swab  Result Value Ref Range Status   MRSA, PCR NEGATIVE NEGATIVE Final   Staphylococcus aureus NEGATIVE NEGATIVE Final    Comment: (NOTE) The Xpert SA Assay (FDA approved for NASAL specimens in patients 75 years of age and older), is one component of a comprehensive surveillance program. It is not intended to diagnose infection nor to guide or monitor treatment. Performed at [facility], [place] [address] [place], [place] [address]   Resp Panel by RT-PCR (Flu A&B, Covid) Nasopharyngeal Swab     Status: None   Collection Time: 02/07/21  9:47 AM   Specimen: Nasopharyngeal Swab; Nasopharyngeal(NP) swabs in vial transport medium  Result Value Ref Range Status   SARS Coronavirus 2 by RT PCR NEGATIVE NEGATIVE Final    Comment: (NOTE) SARS-CoV-2 target nucleic acids are NOT DETECTED.  The SARS-CoV-2 RNA is generally detectable in upper respiratory specimens during the acute phase of infection. The lowest concentration of SARS-CoV-2 viral copies this assay can detect is 138 copies/mL. A negative result does not preclude SARS-Cov-2 infection and should not be used as the sole basis for treatment or other patient management decisions. A negative result may occur with  improper specimen collection/handling,  submission of specimen other than nasopharyngeal swab, presence of viral mutation(s) within the areas targeted by this assay, and inadequate number of viral copies(<138 copies/mL). A negative result must be combined with clinical observations, patient history, and epidemiological information. The expected result is Negative.  Fact Sheet for Patients:  EntrepreneurPulse.com.au  Fact Sheet for Healthcare Providers:  IncredibleEmployment.be  This test is no t yet approved or cleared by the [place] FDA and  has been authorized for detection and/or diagnosis of SARS-CoV-2 by FDA under an Emergency Use Authorization (EUA). This EUA will remain  in effect (meaning this test can be used) for the duration of the COVID-19 declaration under Section 564(b)(1) of the Act, 21 U.S.C.section 360bbb-3(b)(1), unless the authorization is terminated  or revoked sooner.       Influenza A by PCR NEGATIVE NEGATIVE Final   Influenza B by PCR NEGATIVE NEGATIVE Final    Comment: (NOTE) The Xpert Xpress SARS-CoV-2/FLU/RSV plus assay is intended as an aid in the diagnosis of influenza from Nasopharyngeal swab specimens and should not be used as a sole basis for treatment. Nasal washings and aspirates are unacceptable for Xpert Xpress SARS-CoV-2/FLU/RSV testing.  Fact Sheet for Patients: EntrepreneurPulse.com.au  Fact Sheet for Healthcare Providers: IncredibleEmployment.be  This test is not yet approved or cleared by the [place] FDA and has been authorized for detection and/or diagnosis of SARS-CoV-2 by FDA under an Emergency Use Authorization (EUA). This EUA will remain in effect (meaning this test can be used) for the duration  of the COVID-19 declaration under Section 564(b)(1) of the Act, 21 U.S.C. section 360bbb-3(b)(1), unless the authorization is terminated or revoked.  Performed at [facility], [place] [address], [place], [place] [address]      Time coordinating discharge: Over 30 minutes  SIGNED:   Shawna Clamp, MD  [facility] 02/07/2021, 12:44 PM Pager   If 7PM-7AM, please contact night-coverage [email or web address]

## 2021-02-07 NOTE — Progress Notes (Signed)
    Subjective:  Patient reports pain as mild to moderate.  Denies N/V/CP/SOB.   Objective:   VITALS:   Vitals:   02/06/21 1402 02/06/21 2034 02/07/21 0011 02/07/21 0428  BP: (!) 131/97 109/88 (!) 95/57 (!) 145/97  Pulse: 77 77 62 64  Resp: 18 18 16 16   Temp: 99 F (37.2 C) 99.8 F (37.7 C) 99.2 F (37.3 C) 98.6 F (37 C)  TempSrc: Oral Oral Oral Oral  SpO2: 100% 99% 100% 99%  Weight:      Height:        NAD ABD soft Neurovascular intact Sensation intact distally Intact pulses distally Dorsiflexion/Plantar flexion intact Incision: Prevena dressing connected to house vac and functioning properly   Lab Results  Component Value Date   WBC 12.7 (H) 02/07/2021   HGB 11.6 (L) 02/07/2021   HCT 35.9 (L) 02/07/2021   MCV 97.6 02/07/2021   PLT 245 02/07/2021   BMET    Component Value Date/Time   NA 136 02/07/2021 0623   NA 138 03/01/2017 1325   K 3.4 (L) 02/07/2021 0623   K 4.2 03/01/2017 1325   CL 100 02/07/2021 0623   CO2 33 (H) 02/07/2021 0623   CO2 27 03/01/2017 1325   GLUCOSE 177 (H) 02/07/2021 0623   GLUCOSE 135 03/01/2017 1325   BUN 17 02/07/2021 0623   BUN 22.5 03/01/2017 1325   CREATININE 0.77 02/07/2021 0623   CREATININE 0.9 03/01/2017 1325   CALCIUM 9.9 02/07/2021 0623   CALCIUM 10.1 10/29/2018 1007   CALCIUM 10.4 03/01/2017 1325   GFRNONAA >60 02/07/2021 0623   GFRAA >60 05/11/2020 0429     Assessment/Plan: 2 Days Post-Op   Principal Problem:   Femur fracture, left (HCC) Active Problems:   OSA on CPAP   Essential hypertension   Prolonged QT interval   History of CVA (cerebrovascular accident)   WBAT with walker DVT ppx: Aspirin, SCDs, TEDS PO pain control PT/OT Prevena should be disconnected from house vacuum and connected to portable Prevena unit at discharge (unit in the room) Dispo: Planning Follow up one week post discharge with Dr.Swinteck's clinic for removal of Prevena dressing    Dorothyann Peng 02/07/2021, 9:12  AM [facility] is now [facility] [address], [place], [place] [address] Phone: [phone] [email or web address] Facebook  [employer]

## 2021-02-07 NOTE — TOC Transition Note (Signed)
Transition of Care [facility]) - CM/SW Discharge Note   Patient Details  Name: Melinda Kerr MRN: [id number] Date of Birth: 02-26-1935  [facility] CM/SW Contact:  Carolan Avedisian, Marjie Skiff, RN Phone Number: 02/07/2021, 1:51 PM   Clinical Narrative:    Pt to dc back to [facility] today. She will go to room 608 at [facility]. She will transport via [facility]. Yellow DNR on chart for transport. RN to call report to [phone].   Final next level of care: Long Term Nursing Home Barriers to Discharge: Continued Medical Work up   Discharge Plan and Services   Discharge Planning Services: CM Consult          Readmission Risk Interventions Readmission Risk Prevention Plan 02/06/2021 05/10/2020  Transportation Screening Complete Complete  PCP or Specialist Appt within 5-7 Days Complete Complete  Home Care Screening Complete Complete  Medication Review (RN CM) Complete Complete  Some recent data might be hidden

## 2021-02-07 NOTE — Discharge Instructions (Signed)
Advised to follow-up with primary care physician in 1 week. Advised to follow-up with orthopedic as scheduled.   Advised to reduce benzodiazepines. Advised weightbearing as tolerated.      Dr. Rod Can Joint Replacement Specialist [facility] [address] [place], [place] [address] [phone]   TOTAL HIP REPLACEMENT POSTOPERATIVE DIRECTIONS    Hip Rehabilitation, Guidelines Following Surgery   WEIGHT BEARING Weight bearing as tolerated with assist device (walker, cane, etc) as directed, use it as long as suggested by your surgeon or therapist, typically at least 4-6 weeks.  The results of a hip operation are greatly improved after range of motion and muscle strengthening exercises. Follow all safety measures which are given to protect your hip. If any of these exercises cause increased pain or swelling in your joint, decrease the amount until you are comfortable again. Then slowly increase the exercises. Call your caregiver if you have problems or questions.   HOME CARE INSTRUCTIONS  Most of the following instructions are designed to prevent the dislocation of your new hip.  . Remove items at home which could result in a fall. This includes throw rugs or furniture in walking pathways.  . Continue medications as instructed at time of discharge.  You may have some home medications which will be placed on hold until you complete the course of blood thinner medication.  You may start showering once you are discharged home. Do not remove your dressing. . Do not put on socks or shoes without following the instructions of your caregivers.   . Sit on chairs with arms. Use the chair arms to help push yourself up when arising.  . Arrange for the use of a toilet seat elevator so you are not sitting low.   Walk with walker as instructed.  . You may resume a sexual relationship in one month or when given the OK by your caregiver.  Marland Kitchen Use walker as long as  suggested by your caregivers.  . You may put full weight on your legs and walk as much as is comfortable. . Avoid periods of inactivity such as sitting longer than an hour when not asleep. This helps prevent blood clots.  Dennis Bast may return to work once you are cleared by Engineer, production.  . Do not drive a car for 6 weeks or until released by your surgeon.  . Do not drive while taking narcotics.  . Wear elastic stockings for two weeks following surgery during the day but you may remove then at night.  . Make sure you keep all of your appointments after your operation with all of your doctors and caregivers. You should call the office at the above phone number and make an appointment for approximately two weeks after the date of your surgery. . Please pick up a stool softener and laxative for home use as long as you are requiring pain medications.  ICE to the affected hip every three hours for 30 minutes at a time and then as needed for pain and swelling. Continue to use ice on the hip for pain and swelling from surgery. You may notice swelling that will progress down to the foot and ankle.  This is normal after surgery.  Elevate the leg when you are not up walking on it.   . It is important for you to complete the blood thinner medication as prescribed by your doctor.  Continue to use the breathing machine which will help keep your temperature down.  It  is common for your temperature to cycle up and down following surgery, especially at night when you are not up moving around and exerting yourself.  The breathing machine keeps your lungs expanded and your temperature down.  RANGE OF MOTION AND STRENGTHENING EXERCISES  These exercises are designed to help you keep full movement of your hip joint. Follow your caregiver's or physical therapist's instructions. Perform all exercises about fifteen times, three times per day or as directed. Exercise both hips, even if you have had only one joint replacement.  These exercises can be done on a training (exercise) mat, on the floor, on a table or on a bed. Use whatever works the best and is most comfortable for you. Use music or television while you are exercising so that the exercises are a pleasant break in your day. This will make your life better with the exercises acting as a break in routine you can look forward to.  . Lying on your back, slowly slide your foot toward your buttocks, raising your knee up off the floor. Then slowly slide your foot back down until your leg is straight again.  . Lying on your back spread your legs as far apart as you can without causing discomfort.  . Lying on your side, raise your upper leg and foot straight up from the floor as far as is comfortable. Slowly lower the leg and repeat.  . Lying on your back, tighten up the muscle in the front of your thigh (quadriceps muscles). You can do this by keeping your leg straight and trying to raise your heel off the floor. This helps strengthen the largest muscle supporting your knee.  . Lying on your back, tighten up the muscles of your buttocks both with the legs straight and with the knee bent at a comfortable angle while keeping your heel on the floor.   SKILLED REHAB INSTRUCTIONS: If the patient is transferred to a skilled rehab facility following release from the hospital, a list of the current medications will be sent to the facility for the patient to continue.  When discharged from the skilled rehab facility, please have the facility set up the patient's [place] prior to being released. Also, the skilled facility will be responsible for providing the patient with their medications at time of release from the facility to include their pain medication and their blood thinner medication. If the patient is still at the rehab facility at time of the two week follow up appointment, the skilled rehab facility will also need to assist the patient in arranging follow  up appointment in our office and any transportation needs.  POST-OPERATIVE OPIOID TAPER INSTRUCTIONS: . It is important to wean off of your opioid medication as soon as possible. If you do not need pain medication after your surgery it is ok to stop day one. Marland Kitchen Opioids include: o Codeine, Hydrocodone(Norco, Vicodin), Oxycodone(Percocet, oxycontin) and hydromorphone amongst others.  . Long term and even short term use of opiods can cause: o Increased pain response o Dependence o Constipation o Depression o Respiratory depression o And more.  . Withdrawal symptoms can include o Flu like symptoms o Nausea, vomiting o And more . Techniques to manage these symptoms o Hydrate well o Eat regular healthy meals o Stay active o Use relaxation techniques(deep breathing, meditating, yoga) . Do Not substitute Alcohol to help with tapering . If you have been on opioids for less than two weeks and do not have pain than  it is ok to stop all together.  . Plan to wean off of opioids o This plan should start within one week post op of your joint replacement. o Maintain the same interval or time between taking each dose and first decrease the dose.  o Cut the total daily intake of opioids by one tablet each day o Next start to increase the time between doses. o The last dose that should be eliminated is the evening dose.      MAKE SURE YOU:  . Understand these instructions.  . Will watch your condition.  . Will get help right away if you are not doing well or get worse.  Pick up stool softner and laxative for home use following surgery while on pain medications. Keep dressing clean and dry. Do not remove your dressing. Charge VAC unit every night. Continue to use ice for pain and swelling after surgery. Do not use any lotions or creams on the incision until instructed by your surgeon. Total Hip Protocol. Call [phone] ASAP to schedule a follow up appointment within 7 days of discharge for  VAC removal.

## 2021-02-10 ENCOUNTER — Encounter (HOSPITAL_COMMUNITY): Payer: Self-pay | Admitting: Orthopedic Surgery

## 2021-03-04 ENCOUNTER — Other Ambulatory Visit: Payer: Self-pay

## 2021-03-04 ENCOUNTER — Inpatient Hospital Stay (HOSPITAL_COMMUNITY)
Admission: EM | Admit: 2021-03-04 | Discharge: 2021-03-13 | DRG: 570 | Disposition: A | Discharge status: Skilled Nursing Facility | Payer: Medicare Other | Source: Skilled Nursing Facility | Attending: Internal Medicine | Admitting: Internal Medicine

## 2021-03-04 ENCOUNTER — Emergency Department (HOSPITAL_COMMUNITY): Payer: Medicare Other

## 2021-03-04 DIAGNOSIS — I10 Essential (primary) hypertension: Secondary | ICD-10-CM | POA: Diagnosis present

## 2021-03-04 DIAGNOSIS — Z96642 Presence of left artificial hip joint: Secondary | ICD-10-CM | POA: Diagnosis not present

## 2021-03-04 DIAGNOSIS — L02416 Cutaneous abscess of left lower limb: Secondary | ICD-10-CM | POA: Diagnosis present

## 2021-03-04 DIAGNOSIS — T8459XA Infection and inflammatory reaction due to other internal joint prosthesis, initial encounter: Secondary | ICD-10-CM

## 2021-03-04 DIAGNOSIS — Z9221 Personal history of antineoplastic chemotherapy: Secondary | ICD-10-CM | POA: Diagnosis not present

## 2021-03-04 DIAGNOSIS — E785 Hyperlipidemia, unspecified: Secondary | ICD-10-CM | POA: Diagnosis present

## 2021-03-04 DIAGNOSIS — K219 Gastro-esophageal reflux disease without esophagitis: Secondary | ICD-10-CM | POA: Diagnosis present

## 2021-03-04 DIAGNOSIS — D649 Anemia, unspecified: Secondary | ICD-10-CM | POA: Diagnosis present

## 2021-03-04 DIAGNOSIS — E119 Type 2 diabetes mellitus without complications: Secondary | ICD-10-CM | POA: Diagnosis present

## 2021-03-04 DIAGNOSIS — Z8701 Personal history of pneumonia (recurrent): Secondary | ICD-10-CM

## 2021-03-04 DIAGNOSIS — L03114 Cellulitis of left upper limb: Secondary | ICD-10-CM

## 2021-03-04 DIAGNOSIS — Z8542 Personal history of malignant neoplasm of other parts of uterus: Secondary | ICD-10-CM

## 2021-03-04 DIAGNOSIS — Z9049 Acquired absence of other specified parts of digestive tract: Secondary | ICD-10-CM

## 2021-03-04 DIAGNOSIS — F32A Depression, unspecified: Secondary | ICD-10-CM | POA: Diagnosis present

## 2021-03-04 DIAGNOSIS — Z823 Family history of stroke: Secondary | ICD-10-CM | POA: Diagnosis not present

## 2021-03-04 DIAGNOSIS — Z95828 Presence of other vascular implants and grafts: Secondary | ICD-10-CM

## 2021-03-04 DIAGNOSIS — B964 Proteus (mirabilis) (morganii) as the cause of diseases classified elsewhere: Secondary | ICD-10-CM | POA: Diagnosis present

## 2021-03-04 DIAGNOSIS — Z923 Personal history of irradiation: Secondary | ICD-10-CM

## 2021-03-04 DIAGNOSIS — I2510 Atherosclerotic heart disease of native coronary artery without angina pectoris: Secondary | ICD-10-CM | POA: Diagnosis present

## 2021-03-04 DIAGNOSIS — Z90710 Acquired absence of both cervix and uterus: Secondary | ICD-10-CM | POA: Diagnosis not present

## 2021-03-04 DIAGNOSIS — Z86718 Personal history of other venous thrombosis and embolism: Secondary | ICD-10-CM

## 2021-03-04 DIAGNOSIS — I5032 Chronic diastolic (congestive) heart failure: Secondary | ICD-10-CM | POA: Diagnosis present

## 2021-03-04 DIAGNOSIS — Z20822 Contact with and (suspected) exposure to covid-19: Secondary | ICD-10-CM | POA: Diagnosis present

## 2021-03-04 DIAGNOSIS — Z66 Do not resuscitate: Secondary | ICD-10-CM | POA: Diagnosis present

## 2021-03-04 DIAGNOSIS — G4733 Obstructive sleep apnea (adult) (pediatric): Secondary | ICD-10-CM

## 2021-03-04 DIAGNOSIS — M109 Gout, unspecified: Secondary | ICD-10-CM | POA: Diagnosis present

## 2021-03-04 DIAGNOSIS — I110 Hypertensive heart disease with heart failure: Secondary | ICD-10-CM | POA: Diagnosis present

## 2021-03-04 DIAGNOSIS — M25552 Pain in left hip: Secondary | ICD-10-CM

## 2021-03-04 DIAGNOSIS — Z96653 Presence of artificial knee joint, bilateral: Secondary | ICD-10-CM | POA: Diagnosis present

## 2021-03-04 DIAGNOSIS — E038 Other specified hypothyroidism: Secondary | ICD-10-CM | POA: Diagnosis not present

## 2021-03-04 DIAGNOSIS — E039 Hypothyroidism, unspecified: Secondary | ICD-10-CM | POA: Diagnosis present

## 2021-03-04 DIAGNOSIS — L03116 Cellulitis of left lower limb: Secondary | ICD-10-CM | POA: Diagnosis present

## 2021-03-04 DIAGNOSIS — S72002A Fracture of unspecified part of neck of left femur, initial encounter for closed fracture: Secondary | ICD-10-CM | POA: Diagnosis present

## 2021-03-04 DIAGNOSIS — J449 Chronic obstructive pulmonary disease, unspecified: Secondary | ICD-10-CM | POA: Diagnosis present

## 2021-03-04 DIAGNOSIS — Z9989 Dependence on other enabling machines and devices: Secondary | ICD-10-CM | POA: Diagnosis not present

## 2021-03-04 DIAGNOSIS — Z8249 Family history of ischemic heart disease and other diseases of the circulatory system: Secondary | ICD-10-CM

## 2021-03-04 DIAGNOSIS — Z6841 Body Mass Index (BMI) 40.0 and over, adult: Secondary | ICD-10-CM | POA: Diagnosis not present

## 2021-03-04 DIAGNOSIS — Z7982 Long term (current) use of aspirin: Secondary | ICD-10-CM

## 2021-03-04 DIAGNOSIS — Z8672 Personal history of thrombophlebitis: Secondary | ICD-10-CM

## 2021-03-04 DIAGNOSIS — D72819 Decreased white blood cell count, unspecified: Secondary | ICD-10-CM | POA: Diagnosis not present

## 2021-03-04 DIAGNOSIS — Z79890 Hormone replacement therapy (postmenopausal): Secondary | ICD-10-CM

## 2021-03-04 DIAGNOSIS — Z79899 Other long term (current) drug therapy: Secondary | ICD-10-CM

## 2021-03-04 DIAGNOSIS — M545 Low back pain, unspecified: Secondary | ICD-10-CM | POA: Diagnosis present

## 2021-03-04 DIAGNOSIS — Z888 Allergy status to other drugs, medicaments and biological substances status: Secondary | ICD-10-CM

## 2021-03-04 DIAGNOSIS — G8929 Other chronic pain: Secondary | ICD-10-CM | POA: Diagnosis present

## 2021-03-04 DIAGNOSIS — Z96611 Presence of right artificial shoulder joint: Secondary | ICD-10-CM | POA: Diagnosis present

## 2021-03-04 DIAGNOSIS — L0390 Cellulitis, unspecified: Secondary | ICD-10-CM

## 2021-03-04 DIAGNOSIS — Z8673 Personal history of transient ischemic attack (TIA), and cerebral infarction without residual deficits: Secondary | ICD-10-CM

## 2021-03-04 LAB — CBC WITH DIFFERENTIAL/PLATELET
Abs Immature Granulocytes: 0.09 10*3/uL — ABNORMAL HIGH (ref 0.00–0.07)
Basophils Absolute: 0.1 10*3/uL (ref 0.0–0.1)
Basophils Relative: 1 %
Eosinophils Absolute: 0 10*3/uL (ref 0.0–0.5)
Eosinophils Relative: 0 %
HCT: 36.8 % (ref 36.0–46.0)
Hemoglobin: 11.5 g/dL — ABNORMAL LOW (ref 12.0–15.0)
Immature Granulocytes: 1 %
Lymphocytes Relative: 13 %
Lymphs Abs: 1.3 10*3/uL (ref 0.7–4.0)
MCH: 31.3 pg (ref 26.0–34.0)
MCHC: 31.3 g/dL (ref 30.0–36.0)
MCV: 100 fL (ref 80.0–100.0)
Monocytes Absolute: 0.9 10*3/uL (ref 0.1–1.0)
Monocytes Relative: 9 %
Neutro Abs: 7.6 10*3/uL (ref 1.7–7.7)
Neutrophils Relative %: 76 %
Platelets: 503 10*3/uL — ABNORMAL HIGH (ref 150–400)
RBC: 3.68 MIL/uL — ABNORMAL LOW (ref 3.87–5.11)
RDW: 14.8 % (ref 11.5–15.5)
WBC: 9.9 10*3/uL (ref 4.0–10.5)
nRBC: 0 % (ref 0.0–0.2)

## 2021-03-04 LAB — COMPREHENSIVE METABOLIC PANEL
ALT: 13 U/L (ref 0–44)
AST: 22 U/L (ref 15–41)
Albumin: 2.4 g/dL — ABNORMAL LOW (ref 3.5–5.0)
Alkaline Phosphatase: 138 U/L — ABNORMAL HIGH (ref 38–126)
Anion gap: 7 (ref 5–15)
BUN: 9 mg/dL (ref 8–23)
CO2: 33 mmol/L — ABNORMAL HIGH (ref 22–32)
Calcium: 9.2 mg/dL (ref 8.9–10.3)
Chloride: 97 mmol/L — ABNORMAL LOW (ref 98–111)
Creatinine, Ser: 0.67 mg/dL (ref 0.44–1.00)
GFR, Estimated: >60 mL/min (ref >60)
Glucose, Bld: 164 mg/dL — ABNORMAL HIGH (ref 70–99)
Potassium: 3.9 mmol/L (ref 3.5–5.1)
Sodium: 137 mmol/L (ref 135–145)
Total Bilirubin: 0.5 mg/dL (ref 0.3–1.2)
Total Protein: 5.2 g/dL — ABNORMAL LOW (ref 6.5–8.1)

## 2021-03-04 LAB — RESP PANEL BY RT-PCR (FLU A&B, COVID) ARPGX2
Influenza A by PCR: NEGATIVE
Influenza B by PCR: NEGATIVE
SARS Coronavirus 2 by RT PCR: NEGATIVE

## 2021-03-04 LAB — LACTIC ACID, PLASMA: Lactic Acid, Venous: 1.5 mmol/L (ref 0.5–1.9)

## 2021-03-04 LAB — PROCALCITONIN: Procalcitonin: <0.1 ng/mL

## 2021-03-04 MED ORDER — ACETAMINOPHEN 650 MG RE SUPP: 650.0000 mg | Freq: Four times a day (QID) | RECTAL | Status: DC | PRN

## 2021-03-04 MED ORDER — OXYCODONE HCL 5 MG PO TABS
5.0000 mg | ORAL_TABLET | ORAL | Status: DC | PRN
  Administered 2021-03-11 – 2021-03-13 (×2): 5 mg via ORAL
  Filled 2021-03-04 (×3): qty 1

## 2021-03-04 MED ORDER — VANCOMYCIN HCL 10 G IV SOLR
2250.0000 mg | Freq: Once | INTRAVENOUS | Status: AC
  Administered 2021-03-04: 2250 mg via INTRAVENOUS
  Filled 2021-03-04: qty 2000

## 2021-03-04 MED ORDER — VANCOMYCIN HCL 1000 MG/200ML IV SOLN
1000.0000 mg | INTRAVENOUS | Status: DC
  Administered 2021-03-05 – 2021-03-09 (×5): 1000 mg via INTRAVENOUS
  Filled 2021-03-04 (×6): qty 200

## 2021-03-04 MED ORDER — ENOXAPARIN SODIUM 40 MG/0.4ML IJ SOSY
40.0000 mg | PREFILLED_SYRINGE | INTRAMUSCULAR | Status: DC
  Administered 2021-03-04 – 2021-03-05 (×2): 40 mg via SUBCUTANEOUS
  Filled 2021-03-04 (×4): qty 0.4

## 2021-03-04 MED ORDER — ACETAMINOPHEN 325 MG PO TABS: 650.0000 mg | ORAL_TABLET | Freq: Four times a day (QID) | ORAL | Status: DC | PRN

## 2021-03-04 MED ORDER — POLYETHYLENE GLYCOL 3350 17 G PO PACK: 17.0000 g | PACK | Freq: Every day | ORAL | Status: DC | PRN

## 2021-03-04 MED ORDER — PIPERACILLIN-TAZOBACTAM 3.375 G IVPB
3.3750 g | Freq: Three times a day (TID) | INTRAVENOUS | Status: DC
  Administered 2021-03-04 – 2021-03-10 (×18): 3.375 g via INTRAVENOUS
  Filled 2021-03-04 (×17): qty 50

## 2021-03-04 NOTE — ED Provider Notes (Signed)
[place] EMERGENCY DEPARTMENT Provider Note   CSN: [id number] Arrival date & time: 03/04/21  1144     History Chief Complaint  Patient presents with  . Hip Pain    Redness and r/o infection    Melinda Kerr is a 86 y.o. female.  86 y.o female with a PMH of CHF, DVT, pretension presents to the ED with a chief complaint of left hip redness.  Patient reports she was seen by her orthopedist Dr. Lyla Glassing in office today who recommended further follow-up in the emergency department.  Patient had repair of a subacute displaced left femoral neck fracture on Feb 07, 2021.  She was admitted for furthering managing.  Reports that she also received 2 sets of antibiotics which she has been taking although these are unknown.  According to her records none of these are listed.  [facility], where she has been receiving rehabilitation.  Reports no increasing pain to the left hip, however redness has worsened. She reports not being ambulatory at baseline. No prior hx of MRSA. No fever, no abdominal pain no leg swelling. No additional trauma.      The history is provided by the patient.       Past Medical History:  Diagnosis Date  . Arthritis    "shoulders, knees, lower back" (07/14/2017)  . CHF (congestive heart failure) ([place])    "while in hospital w/hysterectomy in 2017"  . Chronic lower back pain   . Depression   . Diverticulitis of large intestine with abscess 07/13/2017  . DVT (deep venous thrombosis) ([place])   . Dyspnea    "cause I'm over weight"  . Esophageal reflux   . Gout   . Hyperlipidemia    under control  . Hypertension   . Hypothyroidism   . Morbid obesity with BMI of 40.0-44.9, adult ([place])   . Nocturia   . OSA on CPAP    "not wearing it when I sleep in my lift chair" (07/14/2017)  . Osteoarthritis   . Phlebitis    hx of  . Pneumonia ?1989  . Spondylosis    with scoliosis  . Type II diabetes mellitus (HCC)     borderline , diet controlled   . Urinary frequency   . Uterine cancer (HCC)    S/P chemo, radiation, hysterectomy    Patient Active Problem List   Diagnosis Date Noted  . Prolonged QT interval 02/04/2021  . History of CVA (cerebrovascular accident) 02/04/2021  . Femur fracture, left ([place]) 02/03/2021  . Closed fracture of right distal femur ([place]) 05/08/2020  . Acute CVA (cerebrovascular accident) ([place]) 05/15/2019  . TIA (transient ischemic attack) 05/14/2019  . Displaced comminuted fracture of shaft of right femur, initial encounter for closed fracture ([place]) 04/08/2019  . GIB (gastrointestinal bleeding) 11/13/2018  . Anemia associated with acute blood loss 11/13/2018  . Closed fracture of right proximal humerus 10/26/2018  . S/P reverse total shoulder arthroplasty, right 10/26/2018  . Acute appendicitis   . Diverticulitis of large intestine with abscess 07/13/2017  . Colitis 07/13/2017  . Aortic atherosclerosis ([place]) 09/12/2016  . Coronary artery calcification 09/12/2016  . Hiatal hernia 09/12/2016  . Lumbar disc disease 09/12/2016  . Chemotherapy-induced thrombocytopenia 08/04/2016  . Chemotherapy induced neutropenia ([place]) 05/26/2016  . Port catheter in place 05/26/2016  . Encounter for antineoplastic chemotherapy 05/26/2016  . Urinary frequency 04/24/2016  . Obstructive sleep apnea on CPAP 04/24/2016  . History of DVT (deep vein  thrombosis) 04/24/2016  . Endometrial cancer, FIGO stage IIIC ([place]) 04/24/2016  . Malignant neoplasm of endometrium ([place]) 03/26/2016  . Dyspnea on exertion 03/19/2016  . Malignant neoplasm of uterus ([place]) 03/19/2016  . Status post right knee replacement 04/16/2014  . Essential hypertension 04/16/2014  . Postoperative anemia due to acute blood loss 04/12/2014  . Status post total left knee replacement 10/14/2013  . Hypothyroidism 10/14/2013  . UI (urinary incontinence) 10/14/2013  . Dyslipidemia 10/14/2013  . Constipation 10/14/2013  . Gout  10/14/2013  . Anemia 10/14/2013  . Allergic rhinitis 10/14/2013  . Insomnia 10/14/2013  . Depression 10/14/2013  . OA (osteoarthritis) of knee 10/09/2013  . OSA on CPAP 08/10/2013  . Morbid obesity with BMI of 45.0-49.9, adult [facility])     Past Surgical History:  Procedure Laterality Date  . ANTERIOR APPROACH HEMI HIP ARTHROPLASTY Left 02/05/2021   Procedure: ANTERIOR APPROACH HEMI HIP ARTHROPLASTY;  Surgeon: Rod Can, MD;  Location: [facility];  Service: Orthopedics;  Laterality: Left;  . APPENDECTOMY    . CATARACT EXTRACTION, BILATERAL Bilateral 2018  . COLONOSCOPY    . DILATATION & CURETTAGE/HYSTEROSCOPY WITH MYOSURE N/A 03/04/2016   Procedure: DILATATION & CURETTAGE/HYSTEROSCOPY ;  Surgeon: Azucena Fallen, MD;  Location: [place] [facility];  Service: Gynecology;  Laterality: N/A;  polyp removal   . ESOPHAGOGASTRODUODENOSCOPY (EGD) WITH PROPOFOL N/A 11/14/2018   Procedure: ESOPHAGOGASTRODUODENOSCOPY (EGD) WITH PROPOFOL;  Surgeon: Wonda Horner, MD;  Location: [facility] ENDOSCOPY;  Service: Endoscopy;  Laterality: N/A;  . FEMUR IM NAIL Right 04/09/2019   Procedure: INTRAMEDULLARY (IM) NAIL INTERTROCH;  Surgeon: Renette Butters, MD;  Location: [place];  Service: Orthopedics;  Laterality: Right;  . HARDWARE REMOVAL Right 05/09/2020   Procedure: HARDWARE REMOVAL RIGHT FEMUR;  Surgeon: Shona Needles, MD;  Location: [place];  Service: Orthopedics;  Laterality: Right;  . HERNIA REPAIR    . IR GENERIC HISTORICAL  05/15/2016   IR US GUIDE VASC ACCESS RIGHT 05/15/2016 Aletta Edouard, MD [facility]  . IR GENERIC HISTORICAL  05/15/2016   IR FLUORO GUIDE CV LINE RIGHT 05/15/2016 Aletta Edouard, MD [facility]  . IR REMOVAL TUN ACCESS W/ PORT W/O FL MOD SED  04/19/2018  . LAPAROSCOPIC CHOLECYSTECTOMY  2004  . LAPAROSCOPIC INCISIONAL / UMBILICAL / VENTRAL HERNIA REPAIR  2004   "w/gallbladder OR"  . LYMPH NODE BIOPSY N/A 03/26/2016   Procedure: [place] LYMPH NODE BIOPSY;  Surgeon: Everitt Amber, MD;  Location: [facility];   Service: Gynecology;  Laterality: N/A;  . ORIF FEMUR FRACTURE Right 05/09/2020   Procedure: OPEN REDUCTION INTERNAL FIXATION (ORIF) DISTAL FEMUR FRACTURE;  Surgeon: Shona Needles, MD;  Location: [place];  Service: Orthopedics;  Laterality: Right;  . REVERSE SHOULDER ARTHROPLASTY Right 10/26/2018   Procedure: REVERSE SHOULDER ARTHROPLASTY;  Surgeon: Nicholes Stairs, MD;  Location: [place];  Service: Orthopedics;  Laterality: Right;  . ROBOTIC ASSISTED TOTAL HYSTERECTOMY WITH BILATERAL SALPINGO OOPHERECTOMY N/A 03/26/2016   Procedure: XI ROBOTIC ASSISTED TOTAL Laproscopic HYSTERECTOMY WITH BILATERAL SALPINGO OOPHORECTOMY;  Surgeon: Everitt Amber, MD;  Location: [facility];  Service: Gynecology;  Laterality: N/A;  . TOTAL KNEE ARTHROPLASTY Left 10/09/2013   Procedure: LEFT TOTAL KNEE ARTHROPLASTY;  Surgeon: [name], MD;  [facility];  Service: Orthopedics;  Laterality: Left;  . TOTAL KNEE ARTHROPLASTY Right 04/09/2014   Procedure: RIGHT TOTAL KNEE ARTHROPLASTY;  Surgeon: [name], MD;  [facility];  Service: Orthopedics;  Laterality: Right;  . TUBAL LIGATION  1982  . VARICOSE VEIN  SURGERY Bilateral 1968  . [place]     OB History   No obstetric history on file.     Family History  Problem Relation Age of Onset  . Heart disease Mother   . Heart disease Father   . CVA Father     Social History   Tobacco Use  . Smoking status: Never Smoker  . Smokeless tobacco: Never Used  Vaping Use  . Vaping Use: Never used  Substance Use Topics  . Alcohol use: No  . Drug use: No    Home Medications Prior to Admission medications   Medication Sig Start Date End Date Taking? Authorizing Provider  acetaminophen (TYLENOL) 500 MG tablet Take 2 tablets (1,000 mg total) by mouth every 8 (eight) hours. 02/06/21 03/08/21  Dorothyann Peng, PA  albuterol (VENTOLIN HFA) 108 (90 Base) MCG/ACT inhaler Inhale 2 puffs into the lungs every 8 (eight) hours as needed for  wheezing.    [provider]  allopurinol (ZYLOPRIM) 300 MG tablet Take 300 mg by mouth daily.     [provider]  aspirin EC 81 MG tablet Take 1 tablet (81 mg total) by mouth 2 (two) times daily with a meal. Swallow whole. 02/06/21 03/23/21  Dorothyann Peng, PA  baclofen (LIORESAL) 10 MG tablet Take 1 tablet (10 mg total) by mouth 3 (three) times daily as needed for muscle spasms. Patient taking differently: Take 10 mg by mouth at bedtime. 04/12/19   Swayze, Ava, DO  calcium carbonate (TUMS - DOSED IN MG ELEMENTAL CALCIUM) 500 MG chewable tablet Chew 1 tablet by mouth daily.    [provider]  cetirizine (ZYRTEC) 10 MG tablet Take 10 mg by mouth daily.    [provider]  citalopram (CELEXA) 10 MG tablet Take 10 mg by mouth daily. 03/20/19   [provider]  diclofenac Sodium (VOLTAREN) 1 % GEL Apply 2 g topically every 12 (twelve) hours as needed (pain).    [provider]  docusate sodium (COLACE) 100 MG capsule Take 1 capsule (100 mg total) by mouth 2 (two) times daily. 05/16/19   [place] Donnamarie Poag, DO  furosemide (LASIX) 40 MG tablet Take 0.5 tablets (20 mg total) by mouth daily. Patient taking differently: Take 40 mg by mouth daily. 05/11/20   Lavina Hamman, MD  hydrOXYzine (ATARAX/VISTARIL) 25 MG tablet Take 25 mg by mouth at bedtime.    [provider]  iron polysaccharides (NIFEREX) 150 MG capsule Take 150 mg by mouth daily.    [provider]  levothyroxine (SYNTHROID, LEVOTHROID) 125 MCG tablet Take 125 mcg by mouth daily at 6 (six) AM.     [provider]  metoCLOPramide (REGLAN) 5 MG tablet Take 5 mg by mouth 2 (two) times daily at 8 am and 10 pm.    [provider]  nystatin (MYCOSTATIN/NYSTOP) powder Apply 1 Bottle topically See admin instructions. Apply topically to abdominal skin folds and under bilateral breast for intertrigo - twice daily - please make sure skin has been cleaned and is dry  before applying    [provider]  Omega-3 Fatty Acids (FISH OIL) 1000 MG CAPS Take 1,000 mg by mouth daily.     [provider]  pantoprazole (PROTONIX) 40 MG tablet Take 1 tablet (40 mg total) by mouth 2 (two) times daily before a meal. 11/17/18   Gherghe, Vella Redhead, MD  polyethylene glycol (MIRALAX / GLYCOLAX) packet Take 17 g by mouth daily  as needed for mild constipation or moderate constipation. Patient taking differently: Take 17 g by mouth daily. 04/12/14   Perkins, Alexzandrew L, PA-C  pyridOXINE (VITAMIN B-6) 100 MG tablet Take 100 mg by mouth daily.    [provider]  saccharomyces boulardii (FLORASTOR) 250 MG capsule Take 250 mg by mouth 2 (two) times daily.    [provider]  senna-docusate (SENOKOT-S) 8.6-50 MG tablet Take 1 tablet by mouth daily.    [provider]  Simethicone 250 MG CAPS Take 250 mg by mouth every 8 (eight) hours as needed (gas).    [provider]  spironolactone (ALDACTONE) 25 MG tablet Take 25 mg by mouth daily.    [provider]  vitamin B-12 (CYANOCOBALAMIN) 1000 MCG tablet Take 1,000 mcg by mouth daily.     [provider]  vitamin C (ASCORBIC ACID) 500 MG tablet Take 500 mg by mouth daily.    [provider]  Vitamin D, Ergocalciferol, (DRISDOL) 1.25 MG (50000 UT) CAPS capsule Take 50,000 Units by mouth every 7 (seven) days. Monday    [provider]    Allergies    Oxybutynin  Review of Systems   Review of Systems  Constitutional: Negative for fever.  Respiratory: Negative for shortness of breath.   Cardiovascular: Negative for chest pain.  Gastrointestinal: Negative for abdominal pain and nausea.  Genitourinary: Negative for flank pain.  Musculoskeletal: Positive for arthralgias and myalgias.  Skin: Positive for color change and wound.  Neurological: Negative for light-headedness and headaches.  All other systems reviewed and are negative.   Physical  Exam Updated Vital Signs BP (!) 141/119 (BP Location: Left Wrist)   Pulse 70   Temp 98.9 F (37.2 C) (Oral)   Resp 20   Wt 110.1 kg   SpO2 92%   BMI 47.40 kg/m   Physical Exam Vitals and nursing note reviewed.  Constitutional:      Appearance: Normal appearance.  HENT:     Head: Normocephalic and atraumatic.     Mouth/Throat:     Mouth: Mucous membranes are moist.  Cardiovascular:     Rate and Rhythm: Normal rate.  Pulmonary:     Effort: Pulmonary effort is normal.  Abdominal:     General: Abdomen is flat.  Musculoskeletal:     Cervical back: Normal range of motion and neck supple.     Left hip: Tenderness present. No deformity, lacerations, bony tenderness or crepitus. Decreased range of motion. Normal strength.       Legs:     Comments: Significant erythema, pitting edema 2+, pulses present bilaterally.  Limited ROM due to pain.  Skin:    General: Skin is warm and dry.  Neurological:     Mental Status: She is alert and oriented to person, place, and time.     ED Results / Procedures / Treatments   Labs (all labs ordered are listed, but only abnormal results are displayed) Labs Reviewed  CBC WITH DIFFERENTIAL/PLATELET - Abnormal; Notable for the following components:      Result Value   RBC 3.68 (*)    Hemoglobin 11.5 (*)    Platelets 503 (*)    Abs Immature Granulocytes 0.09 (*)    All other components within normal limits  COMPREHENSIVE METABOLIC PANEL - Abnormal; Notable for the following components:   Chloride 97 (*)    CO2 33 (*)    Glucose, Bld 164 (*)    Total Protein 5.2 (*)    Albumin  2.4 (*)    Alkaline Phosphatase 138 (*)    All other components within normal limits  CULTURE, BLOOD (ROUTINE X 2)  CULTURE, BLOOD (ROUTINE X 2)  LACTIC ACID, PLASMA  LACTIC ACID, PLASMA    EKG None  Radiology CT Hip Left Wo Contrast  Result Date: 03/04/2021 CLINICAL DATA:  Left hip redness, soft tissue infection. History of left hip arthroplasty on  02/05/2021 EXAM: CT OF THE LEFT HIP WITHOUT CONTRAST TECHNIQUE: Multidetector CT imaging of the left hip was performed according to the standard protocol. Multiplanar CT image reconstructions were also generated. COMPARISON:  02/05/2021, 02/03/2021 x-ray FINDINGS: Bones/Joint/Cartilage Postsurgical changes from left hip hemiarthroplasty. No evidence of periprosthetic fracture or lucency. Ill-defined mineralization within the soft tissues adjacent to the greater and lesser trochanters, which may reflect developing heterotopic ossification. No appreciable periprosthetic fluid collection. Ligaments Suboptimally assessed by CT. Muscles and Tendons Mild atrophy of the proximal thigh musculature. No acute musculotendinous abnormality by CT. Soft tissues Lobulated fluid and air containing collection within the subcutaneous soft tissues overlying the anterolateral aspect of the left hip and proximal thigh measuring approximately 11.2 x 5.0 x 8.3 cm. Collection abuts the does not extend deep to the superficial investing fascia of the proximal thigh musculature. There is surrounding subcutaneous edema. Overlying skin thickening. No gas within the surrounding soft tissues outside of the collection. Mildly prominent left inguinal lymph nodes, likely reactive. IMPRESSION: 1. Lobulated fluid and air containing collection within the subcutaneous soft tissues overlying the anterolateral aspect of the left hip and proximal thigh measuring approximately 11 x 5 x 8 cm, concerning for abscess versus infected postoperative seroma or hematoma. 2. Postsurgical changes from left hip hemiarthroplasty without evidence of periprosthetic fracture or loosening. No periprosthetic fluid collection. Previously described fluid collection does not appear to communicate with the left hip joint space. 3. Mildly prominent left inguinal lymph nodes, likely reactive. Electronically Signed   By: Davina Poke D.O.   On: 03/04/2021 13:10     Procedures Procedures   Medications Ordered in ED Medications - No data to display  ED Course  I have reviewed the triage vital signs and the nursing notes.  Pertinent labs & imaging results that were available during my care of the patient were reviewed by me and considered in my medical decision making (see chart for details).  Clinical Course as of 03/04/21 1437  Tue Mar 04, 2021  1345 Lactic Acid, Venous: 1.5 [JS]    Clinical Course User Index [JS] Janeece Fitting, PA-C   MDM Rules/Calculators/A&P   Patient arrived in the ED from rehab facility [facility].  Patient is there status post left hip fracture done on 02/07/2021 by Dr. Lyla Glassing.  Reports increased redness, palpable swelling along with changes in skin and increased pain since his position home.  Sent in to rule out infection.  During evaluation patient reports dressing was changed today, no increase in drainage.  Has had increasing redness along with streaking in the skin, some suspicion for cellulitis.  She also reports she was given 2 different prescription of antibiotics, these are not listed on her medical records.  He is currently not on a blood thinner.  There is no calf tenderness, no lower extremity swelling.  However left hip region does appear erythematous, with streaking in the skin and pitting edema.  Limited range of motion due to pain.  Arrived in the ED afebrile, with stable vital signs.Will obtain septic work-up for further evaluation.  12:16 PM spoke to Legrand Como  Dellis Filbert, [place] [facility], who has no further recommendations at this time.  He will contact Dr. Lyla Glassing if any additional intervention needs to be performed.  Interpretation of her lab work reveal a CBC without any leukocytosis, hemoglobin 11.5, in the same range as her prior 1 3 weeks ago.  CMP without any electrolyte derangement, creatinine levels within normal limits.  LFTs are unremarkable.  Lactic acid is negative, blood cultures have been ordered  her vitals remained stable without any signs of fever.  Will obtain CT left hip.  CT Left hip showed:  1. Lobulated fluid and air containing collection within the  subcutaneous soft tissues overlying the anterolateral aspect of the  left hip and proximal thigh measuring approximately 11 x 5 x 8 cm,  concerning for abscess versus infected postoperative seroma or  hematoma.  2. Postsurgical changes from left hip hemiarthroplasty without  evidence of periprosthetic fracture or loosening. No periprosthetic  fluid collection. Previously described fluid collection does not  appear to communicate with the left hip joint space.  3. Mildly prominent left inguinal lymph nodes, likely reactive.     1:48 PM these results were discussed with [facility] Odor orthopedic PA.  Awaiting orthopedic recommendation at this time.  Patient's lactic is negative, awaiting further recommendations.  2:10 PM spoke to [facility] Odor Ortho PA who recommended admission via hospitalist.  If wound does not improve in the next 3 days, likely will be I&D in the operating room by Dr. Javier Docker.  Patient will need to be treated with IV antibiotics vancomycin likely treated for cellulitis.  2:36 PM spoke to hospitalist service who will admit patient for further management of her cellulitis of the left hip.   Portions of this note were generated with [employer]. Dictation errors may occur despite best attempts at proofreading.  Final Clinical Impression(s) / ED Diagnoses Final diagnoses:  Left hip pain  Cellulitis of left upper extremity    Rx / DC Orders ED Discharge Orders    None       Janeece Fitting, PA-C 03/04/21 1437    Lacretia Leigh, MD 03/10/21 1452

## 2021-03-04 NOTE — H&P (View-Only) (Signed)
Reason for Consult:Left hip pain Referring Physician: Eric [place] Time called: [phone] Time at bedside: [place] is an 86 y.o. female.  HPI: Carisha was seen for a f/u appt with Dr. Lyla Glassing and was noted to have redness and swelling of the left hip. She was sent to the ED for evaluation. CT and labs were unremarkable.   Past Medical History:  Diagnosis Date  . Arthritis    "shoulders, knees, lower back" (07/14/2017)  . CHF (congestive heart failure) ([place])    "while in hospital w/hysterectomy in 2017"  . Chronic lower back pain   . Depression   . Diverticulitis of large intestine with abscess 07/13/2017  . DVT (deep venous thrombosis) ([place])   . Dyspnea    "cause I'm over weight"  . Esophageal reflux   . Gout   . Hyperlipidemia    under control  . Hypertension   . Hypothyroidism   . Morbid obesity with BMI of 40.0-44.9, adult ([place])   . Nocturia   . OSA on CPAP    "not wearing it when I sleep in my lift chair" (07/14/2017)  . Osteoarthritis   . Phlebitis    hx of  . Pneumonia ?1989  . Spondylosis    with scoliosis  . Type II diabetes mellitus (HCC)    borderline , diet controlled   . Urinary frequency   . Uterine cancer (HCC)    S/P chemo, radiation, hysterectomy    Past Surgical History:  Procedure Laterality Date  . ANTERIOR APPROACH HEMI HIP ARTHROPLASTY Left 02/05/2021   Procedure: ANTERIOR APPROACH HEMI HIP ARTHROPLASTY;  Surgeon: Rod Can, MD;  [facility];  Service: Orthopedics;  Laterality: Left;  . APPENDECTOMY    . CATARACT EXTRACTION, BILATERAL Bilateral 2018  . COLONOSCOPY    . DILATATION & CURETTAGE/HYSTEROSCOPY WITH MYOSURE N/A 03/04/2016   Procedure: DILATATION & CURETTAGE/HYSTEROSCOPY ;  Surgeon: Azucena Fallen, MD;  Location: [facility];  Service: Gynecology;  Laterality: N/A;  polyp removal   . ESOPHAGOGASTRODUODENOSCOPY (EGD) WITH PROPOFOL N/A 11/14/2018   Procedure: ESOPHAGOGASTRODUODENOSCOPY (EGD) WITH PROPOFOL;  Surgeon: Wonda Horner, MD;  Location: [facility] ENDOSCOPY;  Service: Endoscopy;  Laterality: N/A;  . FEMUR IM NAIL Right 04/09/2019   Procedure: INTRAMEDULLARY (IM) NAIL INTERTROCH;  Surgeon: Renette Butters, MD;  Location: [place];  Service: Orthopedics;  Laterality: Right;  . HARDWARE REMOVAL Right 05/09/2020   Procedure: HARDWARE REMOVAL RIGHT FEMUR;  Surgeon: Shona Needles, MD;  Location: [facility];  Service: Orthopedics;  Laterality: Right;  . HERNIA REPAIR    . IR GENERIC HISTORICAL  05/15/2016   IR US GUIDE VASC ACCESS RIGHT 05/15/2016 Aletta Edouard, MD [facility]  . IR GENERIC HISTORICAL  05/15/2016   IR FLUORO GUIDE CV LINE RIGHT 05/15/2016 Aletta Edouard, MD [facility]  . IR REMOVAL TUN ACCESS W/ PORT W/O FL MOD SED  04/19/2018  . LAPAROSCOPIC CHOLECYSTECTOMY  2004  . LAPAROSCOPIC INCISIONAL / UMBILICAL / VENTRAL HERNIA REPAIR  2004   "w/gallbladder OR"  . LYMPH NODE BIOPSY N/A 03/26/2016   Procedure: Sentinel LYMPH NODE BIOPSY;  Surgeon: Everitt Amber, MD;  [facility];  Service: Gynecology;  Laterality: N/A;  . ORIF FEMUR FRACTURE Right 05/09/2020   Procedure: OPEN REDUCTION INTERNAL FIXATION (ORIF) DISTAL FEMUR FRACTURE;  Surgeon: Shona Needles, MD;  Location: [facility] [facility];  Service: Orthopedics;  Laterality: Right;  . REVERSE SHOULDER ARTHROPLASTY Right 10/26/2018   Procedure: REVERSE SHOULDER ARTHROPLASTY;  Surgeon: Victorino December  Saralyn Pilar, MD;  Location: [place];  Service: Orthopedics;  Laterality: Right;  . ROBOTIC ASSISTED TOTAL HYSTERECTOMY WITH BILATERAL SALPINGO OOPHERECTOMY N/A 03/26/2016   Procedure: XI ROBOTIC ASSISTED TOTAL Laproscopic HYSTERECTOMY WITH BILATERAL SALPINGO OOPHORECTOMY;  Surgeon: Everitt Amber, MD;  [facility];  Service: Gynecology;  Laterality: N/A;  . TOTAL KNEE ARTHROPLASTY Left 10/09/2013   Procedure: LEFT TOTAL KNEE ARTHROPLASTY;  Surgeon: [name], MD;  [facility];  Service: Orthopedics;  Laterality: Left;  . TOTAL KNEE ARTHROPLASTY Right 04/09/2014   Procedure:  RIGHT TOTAL KNEE ARTHROPLASTY;  Surgeon: [name], MD;  [facility];  Service: Orthopedics;  Laterality: Right;  . TUBAL LIGATION  1982  . VARICOSE VEIN SURGERY Bilateral 1968  . WISDOM TOOTH EXTRACTION  1963    Family History  Problem Relation Age of Onset  . Heart disease Mother   . Heart disease Father   . CVA Father     Social History:  reports that she has never smoked. She has never used smokeless tobacco. She reports that she does not drink alcohol and does not use drugs.  Allergies:  Allergies  Allergen Reactions  . Oxybutynin Other (See Comments)    Bleeding gums, felt sick    Medications: I have reviewed the patient's current medications.  Results for orders placed or performed during the hospital encounter of 03/04/21 (from the past 48 hour(s))  CBC with Differential     Status: Abnormal   Collection Time: 03/04/21 12:18 PM  Result Value Ref Range   WBC 9.9 4.0 - 10.5 K/uL   RBC 3.68 (L) 3.87 - 5.11 MIL/uL   Hemoglobin 11.5 (L) 12.0 - 15.0 g/dL   HCT 36.8 36.0 - 46.0 %   MCV 100.0 80.0 - 100.0 fL   MCH 31.3 26.0 - 34.0 pg   MCHC 31.3 30.0 - 36.0 g/dL   RDW 14.8 11.5 - 15.5 %   Platelets 503 (H) 150 - 400 K/uL   nRBC 0.0 0.0 - 0.2 %   Neutrophils Relative % 76 %   Neutro Abs 7.6 1.7 - 7.7 K/uL   Lymphocytes Relative 13 %   Lymphs Abs 1.3 0.7 - 4.0 K/uL   Monocytes Relative 9 %   Monocytes Absolute 0.9 0.1 - 1.0 K/uL   Eosinophils Relative 0 %   Eosinophils Absolute 0.0 0.0 - 0.5 K/uL   Basophils Relative 1 %   Basophils Absolute 0.1 0.0 - 0.1 K/uL   Immature Granulocytes 1 %   Abs Immature Granulocytes 0.09 (H) 0.00 - 0.07 K/uL    Comment: Performed at [facility], [address], [place], [place] [address]  Comprehensive metabolic panel     Status: Abnormal   Collection Time: 03/04/21 12:18 PM  Result Value Ref Range   Sodium 137 135 - 145 mmol/L   Potassium 3.9 3.5 - 5.1 mmol/L   Chloride 97 (L) 98 - 111 mmol/L   CO2 33 (H) 22 -  32 mmol/L   Glucose, Bld 164 (H) 70 - 99 mg/dL    Comment: Glucose reference range applies only to samples taken after fasting for at least 8 hours.   BUN 9 8 - 23 mg/dL   Creatinine, Ser 0.67 0.44 - 1.00 mg/dL   Calcium 9.2 8.9 - 10.3 mg/dL   Total Protein 5.2 (L) 6.5 - 8.1 g/dL   Albumin 2.4 (L) 3.5 - 5.0 g/dL   AST 22 15 - 41 U/L   ALT 13 0 -  44 U/L   Alkaline Phosphatase 138 (H) 38 - 126 U/L   Total Bilirubin 0.5 0.3 - 1.2 mg/dL   GFR, Estimated >60 >60 mL/min    Comment: (NOTE) Calculated using the CKD-EPI Creatinine Equation (2021)    Anion gap 7 5 - 15    Comment: Performed at [facility] [address] [place], [place] [address]  Lactic acid, plasma     Status: None   Collection Time: 03/04/21 12:18 PM  Result Value Ref Range   Lactic Acid, Venous 1.5 0.5 - 1.9 mmol/L    Comment: Performed at [facility] [address] [place], [place] [address]    CT Hip Left Wo Contrast  Result Date: 03/04/2021 CLINICAL DATA:  Left hip redness, soft tissue infection. History of left hip arthroplasty on 02/05/2021 EXAM: CT OF THE LEFT HIP WITHOUT CONTRAST TECHNIQUE: Multidetector CT imaging of the left hip was performed according to the standard protocol. Multiplanar CT image reconstructions were also generated. COMPARISON:  02/05/2021, 02/03/2021 x-ray FINDINGS: Bones/Joint/Cartilage Postsurgical changes from left hip hemiarthroplasty. No evidence of periprosthetic fracture or lucency. Ill-defined mineralization within the soft tissues adjacent to the greater and lesser trochanters, which may reflect developing heterotopic ossification. No appreciable periprosthetic fluid collection. Ligaments Suboptimally assessed by CT. Muscles and Tendons Mild atrophy of the proximal thigh musculature. No acute musculotendinous abnormality by CT. Soft tissues Lobulated fluid and air containing collection within the subcutaneous soft tissues overlying the anterolateral aspect of the left hip  and proximal thigh measuring approximately 11.2 x 5.0 x 8.3 cm. Collection abuts the does not extend deep to the superficial investing fascia of the proximal thigh musculature. There is surrounding subcutaneous edema. Overlying skin thickening. No gas within the surrounding soft tissues outside of the collection. Mildly prominent left inguinal lymph nodes, likely reactive. IMPRESSION: 1. Lobulated fluid and air containing collection within the subcutaneous soft tissues overlying the anterolateral aspect of the left hip and proximal thigh measuring approximately 11 x 5 x 8 cm, concerning for abscess versus infected postoperative seroma or hematoma. 2. Postsurgical changes from left hip hemiarthroplasty without evidence of periprosthetic fracture or loosening. No periprosthetic fluid collection. Previously described fluid collection does not appear to communicate with the left hip joint space. 3. Mildly prominent left inguinal lymph nodes, likely reactive. Electronically Signed   By: Davina Poke D.O.   On: 03/04/2021 13:10    Review of Systems  Constitutional: Negative for chills, diaphoresis and fever.  HENT: Negative for ear discharge, ear pain, hearing loss and tinnitus.   Eyes: Negative for photophobia and pain.  Respiratory: Negative for cough and shortness of breath.   Cardiovascular: Negative for chest pain.  Gastrointestinal: Negative for abdominal pain, nausea and vomiting.  Genitourinary: Negative for dysuria, flank pain, frequency and urgency.  Musculoskeletal: Positive for arthralgias (Left hip). Negative for back pain, myalgias and neck pain.  Neurological: Negative for dizziness and headaches.  Hematological: Does not bruise/bleed easily.  Psychiatric/Behavioral: The patient is not nervous/anxious.    Blood pressure (!) 141/119, pulse 70, temperature 98.9 F (37.2 C), temperature source Oral, resp. rate 20, weight 110.1 kg, SpO2 92 %. Physical Exam Constitutional:      General:  She is not in acute distress.    Appearance: She is well-developed. She is not diaphoretic.  HENT:     Head: Normocephalic and atraumatic.  Eyes:     General: No scleral icterus.       Right eye: No discharge.  Left eye: No discharge.     Conjunctiva/sclera: Conjunctivae normal.  Cardiovascular:     Rate and Rhythm: Normal rate and regular rhythm.  Pulmonary:     Effort: Pulmonary effort is normal. No respiratory distress.  Musculoskeletal:     Cervical back: Normal range of motion.     Comments: Left hip: Indurated, erythematous area lateral/post prox thigh, not centered on incision which is C/D/I. Mild TTP.  Skin:    General: Skin is warm and dry.  Neurological:     Mental Status: She is alert.  Psychiatric:        Behavior: Behavior normal.     Assessment/Plan: S/p left THA -- Plan for treatment as if cellulitis is etiology. Given OP abx regimen would recommend vanc +/- other abx. If she doesn't improve in next 36 hours will plan on formal I&D in OR by Dr. Lyla Glassing on Thursday.    Lisette Abu, PA-C Orthopedic Surgery [phone] 03/04/2021, 2:57 PM

## 2021-03-04 NOTE — Consult Note (Signed)
Reason for Consult:Left hip pain Referring Physician: Eric [place] Time called: 1443 Time at bedside: [name] is an 86 y.o. female.  HPI: Melinda Kerr was seen for a f/u appt with Dr. Lyla Glassing and was noted to have redness and swelling of the left hip. She was sent to the ED for evaluation. CT and labs were unremarkable.   Past Medical History:  Diagnosis Date  . Arthritis    "shoulders, knees, lower back" (07/14/2017)  . CHF (congestive heart failure) ([place])    "while in hospital w/hysterectomy in 2017"  . Chronic lower back pain   . Depression   . Diverticulitis of large intestine with abscess 07/13/2017  . DVT (deep venous thrombosis) ([place])   . Dyspnea    "cause I'm over weight"  . Esophageal reflux   . Gout   . Hyperlipidemia    under control  . Hypertension   . Hypothyroidism   . Morbid obesity with BMI of 40.0-44.9, adult ([place])   . Nocturia   . OSA on CPAP    "not wearing it when I sleep in my lift chair" (07/14/2017)  . Osteoarthritis   . Phlebitis    hx of  . Pneumonia ?1989  . Spondylosis    with scoliosis  . Type II diabetes mellitus (HCC)    borderline , diet controlled   . Urinary frequency   . Uterine cancer (HCC)    S/P chemo, radiation, hysterectomy    Past Surgical History:  Procedure Laterality Date  . ANTERIOR APPROACH HEMI HIP ARTHROPLASTY Left 02/05/2021   Procedure: ANTERIOR APPROACH HEMI HIP ARTHROPLASTY;  Surgeon: Rod Can, MD;  [facility];  Service: Orthopedics;  Laterality: Left;  . APPENDECTOMY    . CATARACT EXTRACTION, BILATERAL Bilateral 2018  . COLONOSCOPY    . DILATATION & CURETTAGE/HYSTEROSCOPY WITH MYOSURE N/A 03/04/2016   Procedure: DILATATION & CURETTAGE/HYSTEROSCOPY ;  Surgeon: Azucena Fallen, MD;  Location: [facility];  Service: Gynecology;  Laterality: N/A;  polyp removal   . ESOPHAGOGASTRODUODENOSCOPY (EGD) WITH PROPOFOL N/A 11/14/2018   Procedure: ESOPHAGOGASTRODUODENOSCOPY (EGD) WITH PROPOFOL;  Surgeon: Wonda Horner, MD;  Location: [facility] ENDOSCOPY;  Service: Endoscopy;  Laterality: N/A;  . FEMUR IM NAIL Right 04/09/2019   Procedure: INTRAMEDULLARY (IM) NAIL INTERTROCH;  Surgeon: Renette Butters, MD;  Location: [facility];  Service: Orthopedics;  Laterality: Right;  . HARDWARE REMOVAL Right 05/09/2020   Procedure: HARDWARE REMOVAL RIGHT FEMUR;  Surgeon: Shona Needles, MD;  Location: [place];  Service: Orthopedics;  Laterality: Right;  . HERNIA REPAIR    . IR GENERIC HISTORICAL  05/15/2016   IR US GUIDE VASC ACCESS RIGHT 05/15/2016 Aletta Edouard, MD [facility]  . IR GENERIC HISTORICAL  05/15/2016   IR FLUORO GUIDE CV LINE RIGHT 05/15/2016 Aletta Edouard, MD [facility]  . IR REMOVAL TUN ACCESS W/ PORT W/O FL MOD SED  04/19/2018  . LAPAROSCOPIC CHOLECYSTECTOMY  2004  . LAPAROSCOPIC INCISIONAL / UMBILICAL / VENTRAL HERNIA REPAIR  2004   "w/gallbladder OR"  . LYMPH NODE BIOPSY N/A 03/26/2016   Procedure: Sentinel LYMPH NODE BIOPSY;  Surgeon: Everitt Amber, MD;  [facility];  Service: Gynecology;  Laterality: N/A;  . ORIF FEMUR FRACTURE Right 05/09/2020   Procedure: OPEN REDUCTION INTERNAL FIXATION (ORIF) DISTAL FEMUR FRACTURE;  Surgeon: Shona Needles, MD;  Location: [facility];  Service: Orthopedics;  Laterality: Right;  . REVERSE SHOULDER ARTHROPLASTY Right 10/26/2018   Procedure: REVERSE SHOULDER ARTHROPLASTY;  Surgeon: Victorino December  Saralyn Pilar, MD;  Location: [place];  Service: Orthopedics;  Laterality: Right;  . ROBOTIC ASSISTED TOTAL HYSTERECTOMY WITH BILATERAL SALPINGO OOPHERECTOMY N/A 03/26/2016   Procedure: XI ROBOTIC ASSISTED TOTAL Laproscopic HYSTERECTOMY WITH BILATERAL SALPINGO OOPHORECTOMY;  Surgeon: Everitt Amber, MD;  [facility];  Service: Gynecology;  Laterality: N/A;  . TOTAL KNEE ARTHROPLASTY Left 10/09/2013   Procedure: LEFT TOTAL KNEE ARTHROPLASTY;  Surgeon: [name], MD;  [facility];  Service: Orthopedics;  Laterality: Left;  . TOTAL KNEE ARTHROPLASTY Right 04/09/2014   Procedure:  RIGHT TOTAL KNEE ARTHROPLASTY;  Surgeon: [name], MD;  [facility];  Service: Orthopedics;  Laterality: Right;  . TUBAL LIGATION  1982  . VARICOSE VEIN SURGERY Bilateral 1968  . WISDOM TOOTH EXTRACTION  1963    Family History  Problem Relation Age of Onset  . Heart disease Mother   . Heart disease Father   . CVA Father     Social History:  reports that she has never smoked. She has never used smokeless tobacco. She reports that she does not drink alcohol and does not use drugs.  Allergies:  Allergies  Allergen Reactions  . Oxybutynin Other (See Comments)    Bleeding gums, felt sick    Medications: I have reviewed the patient's current medications.  Results for orders placed or performed during the hospital encounter of 03/04/21 (from the past 48 hour(s))  CBC with Differential     Status: Abnormal   Collection Time: 03/04/21 12:18 PM  Result Value Ref Range   WBC 9.9 4.0 - 10.5 K/uL   RBC 3.68 (L) 3.87 - 5.11 MIL/uL   Hemoglobin 11.5 (L) 12.0 - 15.0 g/dL   HCT 36.8 36.0 - 46.0 %   MCV 100.0 80.0 - 100.0 fL   MCH 31.3 26.0 - 34.0 pg   MCHC 31.3 30.0 - 36.0 g/dL   RDW 14.8 11.5 - 15.5 %   Platelets 503 (H) 150 - 400 K/uL   nRBC 0.0 0.0 - 0.2 %   Neutrophils Relative % 76 %   Neutro Abs 7.6 1.7 - 7.7 K/uL   Lymphocytes Relative 13 %   Lymphs Abs 1.3 0.7 - 4.0 K/uL   Monocytes Relative 9 %   Monocytes Absolute 0.9 0.1 - 1.0 K/uL   Eosinophils Relative 0 %   Eosinophils Absolute 0.0 0.0 - 0.5 K/uL   Basophils Relative 1 %   Basophils Absolute 0.1 0.0 - 0.1 K/uL   Immature Granulocytes 1 %   Abs Immature Granulocytes 0.09 (H) 0.00 - 0.07 K/uL    Comment: Performed at [facility], [address], [place], [place] [address]  Comprehensive metabolic panel     Status: Abnormal   Collection Time: 03/04/21 12:18 PM  Result Value Ref Range   Sodium 137 135 - 145 mmol/L   Potassium 3.9 3.5 - 5.1 mmol/L   Chloride 97 (L) 98 - 111 mmol/L   CO2 33 (H) 22 -  32 mmol/L   Glucose, Bld 164 (H) 70 - 99 mg/dL    Comment: Glucose reference range applies only to samples taken after fasting for at least 8 hours.   BUN 9 8 - 23 mg/dL   Creatinine, Ser 0.67 0.44 - 1.00 mg/dL   Calcium 9.2 8.9 - 10.3 mg/dL   Total Protein 5.2 (L) 6.5 - 8.1 g/dL   Albumin 2.4 (L) 3.5 - 5.0 g/dL   AST 22 15 - 41 U/L   ALT 13 0 -  44 U/L   Alkaline Phosphatase 138 (H) 38 - 126 U/L   Total Bilirubin 0.5 0.3 - 1.2 mg/dL   GFR, Estimated >60 >60 mL/min    Comment: (NOTE) Calculated using the CKD-EPI Creatinine Equation (2021)    Anion gap 7 5 - 15    Comment: Performed at [facility] [address] [place], [place] [address]  Lactic acid, plasma     Status: None   Collection Time: 03/04/21 12:18 PM  Result Value Ref Range   Lactic Acid, Venous 1.5 0.5 - 1.9 mmol/L    Comment: Performed at [facility] [address], [place],  [address]    CT Hip Left Wo Contrast  Result Date: 03/04/2021 CLINICAL DATA:  Left hip redness, soft tissue infection. History of left hip arthroplasty on 02/05/2021 EXAM: CT OF THE LEFT HIP WITHOUT CONTRAST TECHNIQUE: Multidetector CT imaging of the left hip was performed according to the standard protocol. Multiplanar CT image reconstructions were also generated. COMPARISON:  02/05/2021, 02/03/2021 x-ray FINDINGS: Bones/Joint/Cartilage Postsurgical changes from left hip hemiarthroplasty. No evidence of periprosthetic fracture or lucency. Ill-defined mineralization within the soft tissues adjacent to the greater and lesser trochanters, which may reflect developing heterotopic ossification. No appreciable periprosthetic fluid collection. Ligaments Suboptimally assessed by CT. Muscles and Tendons Mild atrophy of the proximal thigh musculature. No acute musculotendinous abnormality by CT. Soft tissues Lobulated fluid and air containing collection within the subcutaneous soft tissues overlying the anterolateral aspect of the left hip  and proximal thigh measuring approximately 11.2 x 5.0 x 8.3 cm. Collection abuts the does not extend deep to the superficial investing fascia of the proximal thigh musculature. There is surrounding subcutaneous edema. Overlying skin thickening. No gas within the surrounding soft tissues outside of the collection. Mildly prominent left inguinal lymph nodes, likely reactive. IMPRESSION: 1. Lobulated fluid and air containing collection within the subcutaneous soft tissues overlying the anterolateral aspect of the left hip and proximal thigh measuring approximately 11 x 5 x 8 cm, concerning for abscess versus infected postoperative seroma or hematoma. 2. Postsurgical changes from left hip hemiarthroplasty without evidence of periprosthetic fracture or loosening. No periprosthetic fluid collection. Previously described fluid collection does not appear to communicate with the left hip joint space. 3. Mildly prominent left inguinal lymph nodes, likely reactive. Electronically Signed   By: Davina Poke D.O.   On: 03/04/2021 13:10    Review of Systems  Constitutional: Negative for chills, diaphoresis and fever.  HENT: Negative for ear discharge, ear pain, hearing loss and tinnitus.   Eyes: Negative for photophobia and pain.  Respiratory: Negative for cough and shortness of breath.   Cardiovascular: Negative for chest pain.  Gastrointestinal: Negative for abdominal pain, nausea and vomiting.  Genitourinary: Negative for dysuria, flank pain, frequency and urgency.  Musculoskeletal: Positive for arthralgias (Left hip). Negative for back pain, myalgias and neck pain.  Neurological: Negative for dizziness and headaches.  Hematological: Does not bruise/bleed easily.  Psychiatric/Behavioral: The patient is not nervous/anxious.    Blood pressure (!) 141/119, pulse 70, temperature 98.9 F (37.2 C), temperature source Oral, resp. rate 20, weight 110.1 kg, SpO2 92 %. Physical Exam Constitutional:      General:  She is not in acute distress.    Appearance: She is well-developed. She is not diaphoretic.  HENT:     Head: Normocephalic and atraumatic.  Eyes:     General: No scleral icterus.       Right eye: No discharge.  Left eye: No discharge.     Conjunctiva/sclera: Conjunctivae normal.  Cardiovascular:     Rate and Rhythm: Normal rate and regular rhythm.  Pulmonary:     Effort: Pulmonary effort is normal. No respiratory distress.  Musculoskeletal:     Cervical back: Normal range of motion.     Comments: Left hip: Indurated, erythematous area lateral/post prox thigh, not centered on incision which is C/D/I. Mild TTP.  Skin:    General: Skin is warm and dry.  Neurological:     Mental Status: She is alert.  Psychiatric:        Behavior: Behavior normal.     Assessment/Plan: S/p left THA -- Plan for treatment as if cellulitis is etiology. Given OP abx regimen would recommend vanc +/- other abx. If she doesn't improve in next 36 hours will plan on formal I&D in OR by Dr. Lyla Glassing on Thursday.    Lisette Abu, PA-C Orthopedic Surgery [phone] 03/04/2021, 2:57 PM

## 2021-03-04 NOTE — ED Notes (Signed)
Procalcitonin added to earlier blood work

## 2021-03-04 NOTE — Discharge Instructions (Addendum)
Keep dressing clean and dry. Do not remove. Charge VAC unit nightly. Take antibiotics as directed by your infectious disease doctor. Call [phone] ASAP to schedule a follow up appointment in 1 week for remove of your negative pressure dressing.

## 2021-03-04 NOTE — Progress Notes (Signed)
Pharmacy Antibiotic Note  Melinda Kerr is a 86 y.o. female admitted on 03/04/2021 with cellulitis.  Pharmacy has been consulted for vancomycin and pip/tazo dosing.  SCr is at baseline.  Plan: Start Pip/tazo 3.375mg  IV q8hr   Continue: 2250 mg IV Vancomycin x 1 dose, then scheduled 1000mg  q24h IV Vancomycin  Calculated AUC of 510 (Cmax 31.6; Cmin 13.9) Levels not ordered at this time Adjust as needed for renal function  Weight: 110.1 kg (242 lb 11.7 oz)  Temp (24hrs), Avg:98.9 F (37.2 C), Min:98.9 F (37.2 C), Max:98.9 F (37.2 C)  Recent Labs  Lab 03/04/21 1218  WBC 9.9  CREATININE 0.67  LATICACIDVEN 1.5    Estimated Creatinine Clearance: 58.9 mL/min (by C-G formula based on SCr of 0.67 mg/dL).    Allergies  Allergen Reactions  . Oxybutynin Other (See Comments)    Bleeding gums, felt sick    Antimicrobials this admission: Vancomycin 6/7 >> Pip/tazo 6/7 >>  Dose adjustments this admission: n/a  Thank you for allowing pharmacy to be a part of this patient's care.  Norina Buzzard, PharmD PGY1 Pharmacy Resident 03/04/2021 4:00 PM

## 2021-03-04 NOTE — ED Triage Notes (Signed)
Pt from [facility] home, there for rehab post hip surgery.  Seen ortho today and sent here for redness to left hip, area.  States pain 10/10.  Pulses palpable

## 2021-03-04 NOTE — H&P (Addendum)
History and Physical    Melinda Kerr MWN:[id number] DOB: 1935-06-14 DOA: 03/04/2021  PCP: Maury Dus, MD  Patient coming from: [facility]  I have personally briefly reviewed patient's old medical records in [place]  Chief Complaint: Left Hip Pain  HPI: Melinda Kerr is a 86 y.o. female with medical history significant of chronic diastolic congestive heart failure, essential hypertension, CAD, history of CVA, OSA on CPAP, HLD, endometrial cancer, Hx DVT who presents to [facility] ED from her orthopedist office for worsening left hip redness and pain.  Patient recently underwent Hemi arthroplasty left hip following fracture on 02/05/2021 by Dr. Lyla Glassing.  Over the last few weeks, patient reports she is been on 2 different sets of antibiotics.  Seen by Dr. Lyla Glassing today who recommended evaluation in the ED for further work-up.  Currently, has only been able to stand from a sitting position at her skilled nursing facility.  Patient denies any recent falls since her last hospitalization.  No fever, no fatigue, no chills/night sweats, no nausea cefonicid/diarrhea, no chest pain, no shortness of breath, no cough/congestion.  ED Course: Temperature 98.9 F, HR 70, RR 20, BP 141/119, SPO2 92% on room air.  WBC 9.9, hemoglobin 11.5.  Sodium 137, potassium 3.9, chloride 97, CO2 33, BUN 9, creatinine 0.67, glucose 164.  Lactic acid 1.5.  Covid-19 PCR negative.  CT left hip without contrast with lobulated fluid/air-containing collection subcutaneous soft tissue anterior lateral aspect left hip/proximal thigh measuring 11 x 5 x 8 cm concerning for abscess versus infected postoperative seroma or hematoma; does not appear to communicate with the left hip joint space.  Orthopedics was consulted.  Patient started on IV vancomycin.  Duration consulted for further evaluation and management of postoperative cellulitis/abscess vs seroma vs hematoma.  Review of Systems:   Constitutional - No Fatigue, No  Weight Loss Vision - No impaired vision, decreased visual acuity Ear/Nose/Mouth/Throat - No decreased hearing, no congestion Respiratory - No shortness of breath, no exertional dyspnea, chronic cough Cardiovascular - No chest pain, no palpitations, no peripheral edema Gastrointestinal - No nausea, no diarrhea, no constipation, Genitourinary - No excessive urination, no urinary incontinence Integumentary - + erythema with associated swelling and pain to left hip/groin Neurologic - No numbness, no tingling, no dizziness, no headaches, no confusion or memory loss   Past Medical History:  Diagnosis Date  . Arthritis    "shoulders, knees, lower back" (07/14/2017)  . CHF (congestive heart failure) ([place])    "while in hospital w/hysterectomy in 2017"  . Chronic lower back pain   . Depression   . Diverticulitis of large intestine with abscess 07/13/2017  . DVT (deep venous thrombosis) ([place])   . Dyspnea    "cause I'm over weight"  . Esophageal reflux   . Gout   . Hyperlipidemia    under control  . Hypertension   . Hypothyroidism   . Morbid obesity with BMI of 40.0-44.9, adult ([place])   . Nocturia   . OSA on CPAP    "not wearing it when I sleep in my lift chair" (07/14/2017)  . Osteoarthritis   . Phlebitis    hx of  . Pneumonia ?1989  . Spondylosis    with scoliosis  . Type II diabetes mellitus (HCC)    borderline , diet controlled   . Urinary frequency   . Uterine cancer (HCC)    S/P chemo, radiation, hysterectomy    Past Surgical History:  Procedure Laterality Date  . ANTERIOR  APPROACH HEMI HIP ARTHROPLASTY Left 02/05/2021   Procedure: ANTERIOR APPROACH HEMI HIP ARTHROPLASTY;  Surgeon: Rod Can, MD;  Location: [facility];  Service: Orthopedics;  Laterality: Left;  . APPENDECTOMY    . CATARACT EXTRACTION, BILATERAL Bilateral 2018  . COLONOSCOPY    . DILATATION & CURETTAGE/HYSTEROSCOPY WITH MYOSURE N/A 03/04/2016   Procedure: DILATATION & CURETTAGE/HYSTEROSCOPY ;  Surgeon:  Azucena Fallen, MD;  Location: [facility];  Service: Gynecology;  Laterality: N/A;  polyp removal   . ESOPHAGOGASTRODUODENOSCOPY (EGD) WITH PROPOFOL N/A 11/14/2018   Procedure: ESOPHAGOGASTRODUODENOSCOPY (EGD) WITH PROPOFOL;  Surgeon: Wonda Horner, MD;  Location: [facility];  Service: Endoscopy;  Laterality: N/A;  . FEMUR IM NAIL Right 04/09/2019   Procedure: INTRAMEDULLARY (IM) NAIL INTERTROCH;  Surgeon: Renette Butters, MD;  Location: [facility];  Service: Orthopedics;  Laterality: Right;  . HARDWARE REMOVAL Right 05/09/2020   Procedure: HARDWARE REMOVAL RIGHT FEMUR;  Surgeon: Shona Needles, MD;  Location: [facility];  Service: Orthopedics;  Laterality: Right;  . HERNIA REPAIR    . IR GENERIC HISTORICAL  05/15/2016   IR US GUIDE VASC ACCESS RIGHT 05/15/2016 Aletta Edouard, MD [facility]  . IR GENERIC HISTORICAL  05/15/2016   IR FLUORO GUIDE CV LINE RIGHT 05/15/2016 Aletta Edouard, MD [facility]  . IR REMOVAL TUN ACCESS W/ PORT W/O FL MOD SED  04/19/2018  . LAPAROSCOPIC CHOLECYSTECTOMY  2004  . LAPAROSCOPIC INCISIONAL / UMBILICAL / VENTRAL HERNIA REPAIR  2004   "w/gallbladder OR"  . LYMPH NODE BIOPSY N/A 03/26/2016   Procedure: Sent[place] LYMPH NODE BIOPSY;  Surgeon: Everitt Amber, MD;  Location: [facility];  Service: Gynecology;  Laterality: N/A;  . ORIF FEMUR FRACTURE Right 05/09/2020   Procedure: OPEN REDUCTION INTERNAL FIXATION (ORIF) DISTAL FEMUR FRACTURE;  Surgeon: Shona Needles, MD;  Location: [facility];  Service: Orthopedics;  Laterality: Right;  . REVERSE SHOULDER ARTHROPLASTY Right 10/26/2018   Procedure: REVERSE SHOULDER ARTHROPLASTY;  Surgeon: Nicholes Stairs, MD;  Location: [place];  Service: Orthopedics;  Laterality: Right;  . ROBOTIC ASSISTED TOTAL HYSTERECTOMY WITH BILATERAL SALPINGO OOPHERECTOMY N/A 03/26/2016   Procedure: XI ROBOTIC ASSISTED TOTAL Laproscopic HYSTERECTOMY WITH BILATERAL SALPINGO OOPHORECTOMY;  Surgeon: Everitt Amber, MD;  Location: [facility];  Service: Gynecology;  Laterality: N/A;   . TOTAL KNEE ARTHROPLASTY Left 10/09/2013   Procedure: LEFT TOTAL KNEE ARTHROPLASTY;  Surgeon: [name], MD;  Location: [facility];  Service: Orthopedics;  Laterality: Left;  . TOTAL KNEE ARTHROPLASTY Right 04/09/2014   Procedure: RIGHT TOTAL KNEE ARTHROPLASTY;  Surgeon: [name], MD;  Location: [facility];  Service: Orthopedics;  Laterality: Right;  . TUBAL LIGATION  1982  . VARICOSE VEIN SURGERY Bilateral 1968  . [place]     reports that she has never smoked. She has never used smokeless tobacco. She reports that she does not drink alcohol and does not use drugs.  Allergies  Allergen Reactions  . Oxybutynin Other (See Comments)    Bleeding gums, felt sick    Family History  Problem Relation Age of Onset  . Heart disease Mother   . Heart disease Father   . CVA Father     Family history reviewed and not pertinent   Prior to Admission medications   Medication Sig Start Date End Date Taking? Authorizing Provider  cefadroxil (DURICEF) 500 MG capsule Take 500 mg by mouth 2 (two) times daily.   Yes [provider]  acetaminophen (TYLENOL) 500 MG tablet Take 2 tablets (  1,000 mg total) by mouth every 8 (eight) hours. 02/06/21 03/08/21  Dorothyann Peng, PA  albuterol (VENTOLIN HFA) 108 (90 Base) MCG/ACT inhaler Inhale 2 puffs into the lungs every 8 (eight) hours as needed for wheezing.    [provider]  allopurinol (ZYLOPRIM) 300 MG tablet Take 300 mg by mouth daily.     [provider]  aspirin EC 81 MG tablet Take 1 tablet (81 mg total) by mouth 2 (two) times daily with a meal. Swallow whole. 02/06/21 03/23/21  Dorothyann Peng, PA  baclofen (LIORESAL) 10 MG tablet Take 1 tablet (10 mg total) by mouth 3 (three) times daily as needed for muscle spasms. Patient taking differently: Take 10 mg by mouth at bedtime. 04/12/19   Swayze, Ava, DO  calcium carbonate (TUMS - DOSED IN MG ELEMENTAL CALCIUM) 500 MG chewable tablet Chew 1 tablet by  mouth daily.    [provider]  cetirizine (ZYRTEC) 10 MG tablet Take 10 mg by mouth daily.    [provider]  citalopram (CELEXA) 10 MG tablet Take 10 mg by mouth daily. 03/20/19   [provider]  diclofenac Sodium (VOLTAREN) 1 % GEL Apply 2 g topically every 12 (twelve) hours as needed (pain).    [provider]  docusate sodium (COLACE) 100 MG capsule Take 1 capsule (100 mg total) by mouth 2 (two) times daily. 05/16/19   [place] Donnamarie Poag, DO  furosemide (LASIX) 40 MG tablet Take 0.5 tablets (20 mg total) by mouth daily. Patient taking differently: Take 40 mg by mouth daily. 05/11/20   Lavina Hamman, MD  hydrOXYzine (ATARAX/VISTARIL) 25 MG tablet Take 25 mg by mouth at bedtime.    [provider]  iron polysaccharides (NIFEREX) 150 MG capsule Take 150 mg by mouth daily.    [provider]  levothyroxine (SYNTHROID, LEVOTHROID) 125 MCG tablet Take 125 mcg by mouth daily at 6 (six) AM.     [provider]  metoCLOPramide (REGLAN) 5 MG tablet Take 5 mg by mouth 2 (two) times daily at 8 am and 10 pm.    [provider]  nystatin (MYCOSTATIN/NYSTOP) powder Apply 1 Bottle topically See admin instructions. Apply topically to abdominal skin folds and under bilateral breast for intertrigo - twice daily - please make sure skin has been cleaned and is dry before applying    [provider]  Omega-3 Fatty Acids (FISH OIL) 1000 MG CAPS Take 1,000 mg by mouth daily.     [provider]  pantoprazole (PROTONIX) 40 MG tablet Take 1 tablet (40 mg total) by mouth 2 (two) times daily before a meal. 11/17/18   Gherghe, Vella Redhead, MD  polyethylene glycol (MIRALAX / GLYCOLAX) packet Take 17 g by mouth daily as needed for mild constipation or moderate constipation. Patient taking differently: Take 17 g by mouth daily. 04/12/14   Perkins, Alexzandrew L, PA-C  pyridOXINE (VITAMIN B-6) 100 MG tablet Take 100 mg by mouth daily.     [provider]  saccharomyces boulardii (FLORASTOR) 250 MG capsule Take 250 mg by mouth 2 (two) times daily.    [provider]  senna-docusate (SENOKOT-S) 8.6-50 MG tablet Take 1 tablet by mouth daily.    [provider]  Simethicone 250 MG CAPS Take 250 mg by mouth every 8 (eight) hours as needed (gas).    [provider]  spironolactone (ALDACTONE) 25 MG tablet Take 25 mg by mouth daily.    [provider]  vitamin B-12 (CYANOCOBALAMIN) 1000 MCG tablet Take 1,000 mcg by mouth daily.     [provider]  vitamin C (ASCORBIC ACID) 500 MG tablet Take 500 mg by mouth daily.    [provider]  Vitamin D, Ergocalciferol, (DRISDOL) 1.25 MG (50000 UT) CAPS capsule Take 50,000 Units by mouth every 7 (seven) days. Monday    [provider]    Physical Exam: Vitals:   03/04/21 1415 03/04/21 1430 03/04/21 1445 03/04/21 1535  BP: (!) 141/119  123/78 (!) 134/103  Pulse: 70  80 73  Resp: 20  17 20   Temp:      TempSrc:      SpO2: 92%  95% 95%  Weight:  110.1 kg      Constitutional: NAD, calm, comfortable, obese, elderly in appearance Eyes: PERRL, lids and conjunctivae normal ENMT: Mucous membranes are moist. Posterior pharynx clear of any exudate or lesions.Normal dentition.  Neck: normal, supple, no masses, no thyromegaly Respiratory: clear to auscultation bilaterally, no wheezing, no crackles. Normal respiratory effort. No accessory muscle use.  On room air Cardiovascular: Regular rate and rhythm, no murmurs / rubs / gallops. No extremity edema. 2+ pedal pulses. No carotid bruits.  Abdomen: no tenderness, no masses palpated. No hepatosplenomegaly. Bowel sounds positive.  Musculoskeletal: no clubbing / cyanosis. No joint deformity upper and lower extremities. Good ROM, no contractures. Normal muscle tone.  Skin: Erythematous/indurated rash lateral/posterior proximal left thigh which is mildly tender to palpation Neurologic:  CN 2-12 grossly intact. Sensation intact, DTR normal. Strength 5/5 in all 4.  Psychiatric: Normal judgment and insight. Alert and oriented x 3. Normal mood.           Labs on Admission: I have personally reviewed following labs and imaging studies  CBC: Recent Labs  Lab 03/04/21 1218  WBC 9.9  NEUTROABS 7.6  HGB 11.5*  HCT 36.8  MCV 100.0  PLT 956*   Basic Metabolic Panel: Recent Labs  Lab 03/04/21 1218  NA 137  K 3.9  CL 97*  CO2 33*  GLUCOSE 164*  BUN 9  CREATININE 0.67  CALCIUM 9.2   GFR: Estimated Creatinine Clearance: 58.9 mL/min (by C-G formula based on SCr of 0.67 mg/dL). Liver Function Tests: Recent Labs  Lab 03/04/21 1218  AST 22  ALT 13  ALKPHOS 138*  BILITOT 0.5  PROT 5.2*  ALBUMIN 2.4*   No results for input(s): LIPASE, AMYLASE in the last 168 hours. No results for input(s): AMMONIA in the last 168 hours. Coagulation Profile: No results for input(s): INR, PROTIME in the last 168 hours. Cardiac Enzymes: No results for input(s): CKTOTAL, CKMB, CKMBINDEX, TROPONINI in the last 168 hours. BNP (last 3 results) No results for input(s): PROBNP in the last 8760 hours. HbA1C: No results for input(s): HGBA1C in the last 72 hours. CBG: No results for input(s): GLUCAP in the last 168 hours. Lipid Profile: No results for input(s): CHOL, HDL, LDLCALC, TRIG, CHOLHDL, LDLDIRECT in the last 72 hours. Thyroid Function Tests: No results for input(s): TSH, T4TOTAL, FREET4, T3FREE, THYROIDAB in the last 72 hours. Anemia Panel: No results for input(s): VITAMINB12, FOLATE, FERRITIN, TIBC, IRON, RETICCTPCT in the last 72 hours. Urine analysis:    Component Value Date/Time   COLORURINE COLORLESS (A) 05/14/2019 1331   APPEARANCEUR CLEAR 05/14/2019 1331   LABSPEC 1.018 05/14/2019 1331   LABSPEC 1.020 05/04/2016 1006   PHURINE 5.0 05/14/2019 1331   GLUCOSEU NEGATIVE 05/14/2019 1331   GLUCOSEU Negative 05/04/2016 1006   HGBUR NEGATIVE  05/14/2019 1331    BILIRUBINUR NEGATIVE 05/14/2019 1331   BILIRUBINUR Negative 05/04/2016 1006   KETONESUR NEGATIVE 05/14/2019 1331   PROTEINUR NEGATIVE 05/14/2019 1331   UROBILINOGEN 0.2 05/04/2016 1006   NITRITE NEGATIVE 05/14/2019 1331   LEUKOCYTESUR NEGATIVE 05/14/2019 1331   LEUKOCYTESUR Small 05/04/2016 1006    Radiological Exams on Admission: CT Hip Left Wo Contrast  Result Date: 03/04/2021 CLINICAL DATA:  Left hip redness, soft tissue infection. History of left hip arthroplasty on 02/05/2021 EXAM: CT OF THE LEFT HIP WITHOUT CONTRAST TECHNIQUE: Multidetector CT imaging of the left hip was performed according to the standard protocol. Multiplanar CT image reconstructions were also generated. COMPARISON:  02/05/2021, 02/03/2021 x-ray FINDINGS: Bones/Joint/Cartilage Postsurgical changes from left hip hemiarthroplasty. No evidence of periprosthetic fracture or lucency. Ill-defined mineralization within the soft tissues adjacent to the greater and lesser trochanters, which may reflect developing heterotopic ossification. No appreciable periprosthetic fluid collection. Ligaments Suboptimally assessed by CT. Muscles and Tendons Mild atrophy of the proximal thigh musculature. No acute musculotendinous abnormality by CT. Soft tissues Lobulated fluid and air containing collection within the subcutaneous soft tissues overlying the anterolateral aspect of the left hip and proximal thigh measuring approximately 11.2 x 5.0 x 8.3 cm. Collection abuts the does not extend deep to the superficial investing fascia of the proximal thigh musculature. There is surrounding subcutaneous edema. Overlying skin thickening. No gas within the surrounding soft tissues outside of the collection. Mildly prominent left inguinal lymph nodes, likely reactive. IMPRESSION: 1. Lobulated fluid and air containing collection within the subcutaneous soft tissues overlying the anterolateral aspect of the left hip and proximal thigh measuring approximately 11  x 5 x 8 cm, concerning for abscess versus infected postoperative seroma or hematoma. 2. Postsurgical changes from left hip hemiarthroplasty without evidence of periprosthetic fracture or loosening. No periprosthetic fluid collection. Previously described fluid collection does not appear to communicate with the left hip joint space. 3. Mildly prominent left inguinal lymph nodes, likely reactive. Electronically Signed   By: Davina Poke D.O.   On: 03/04/2021 13:10    EKG: Independently reviewed.   Assessment/Plan Principal Problem:   Abscess of hip, left Active Problems:   OSA on CPAP   Morbid obesity with BMI of 45.0-49.9, adult (HCC)   Hypothyroidism   Dyslipidemia   Depression   Cellulitis    Cellulitis left hip/groin Concern for left hip abscess vs seroma vs hematoma Patient presenting from her orthopedist office with progressive erythema, swelling, induration of her left hip/groin area.  Patient reports has been on 2 rounds of oral antibiotics outpatient without much effect.  Patient is afebrile without leukocytosis.  Lactic acid 1.5.  CT left hip without contrast with lobulated fluid collection subcutaneous soft tissues anterior lateral left hip measuring 11 x 5 x 8 cm concerning for abscess versus seroma versus hematoma.  Seen by orthopedics, Hilbert Odor, PA with recommendation of IV antibiotics; and if does not improve over next 24-40 hours, may need surgical intervention. --Admit inpatient, med/surg --Blood cultures ordered --Vancomycin/Zosyn, pharmacy consulted for dosing and monitoring --Check procalcitonin --Follow CBC daily  Chronic diastolic congestive heart failure, compensated Hypothyroidism Essential hypertension CAD History of CVA Hyperlipidemia Resume home medications following reconciliation from pharmacy. MAR not sent from [place], pharmacy working on.   OSA --Continue nocturnal CPAP  Morbid obesity Body mass index is 47.4 kg/m.  Discussed with patient  needs for aggressive lifestyle changes/weight loss as this complicates all facets of care.  Outpatient follow-up with PCP.  May benefit from  bariatric evaluation outpatient.    DVT prophylaxis: Lovenox Code Status: DNR Family Communication: No family present at bedside, patient declined calling her niece who was already updated Disposition Plan: Anticipate discharge back to SNF when medically stable Consults called: Orthopedics Admission status: Inpatient Level of care: Med-Surg   Severity of Illness: The appropriate patient status for this patient is INPATIENT. Inpatient status is judged to be reasonable and necessary in order to provide the required intensity of service to ensure the patient's safety. The patient's presenting symptoms, physical exam findings, and initial radiographic and laboratory data in the context of their chronic comorbidities is felt to place them at high risk for further clinical deterioration. Furthermore, it is not anticipated that the patient will be medically stable for discharge from the hospital within 2 midnights of admission. The following factors support the patient status of inpatient.   " The patient's presenting symptoms include worsening left hip pain with associated erythema, failed 2 outpatient antibiotic trials " The worrisome physical exam findings include erythema/induration, tenderness to palpation left thigh " The initial radiographic and laboratory data are worrisome because of 11 x 5 x 8 cm lesion concerning for abscess versus seroma versus hematoma on CT scan " The chronic co-morbidities include morbid obesity, diastolic CHF, HTN, CAD, OSA, HLD, history of endometrial cancer.   * I certify that at the point of admission it is my clinical judgment that the patient will require inpatient hospital care spanning beyond 2 midnights from the point of admission due to high intensity of service, high risk for further deterioration and high frequency of  surveillance required.*    Yareli Carthen J [place] DO [facility] Available via Epic secure chat 7am-7pm After these hours, please refer to coverage provider listed on [email or web address] 03/04/2021, 3:47 PM

## 2021-03-04 NOTE — Progress Notes (Signed)
Pharmacy Antibiotic Note  Melinda Kerr is a 86 y.o. female admitted on 03/04/2021 with cellulitis.  Pharmacy has been consulted for vancomycin dosing.  SCr is at baseline.  Plan: Give 2250 mg IV Vancomycin x 1 dose now, then scheduled 1000mg  q24h IV Vancomycin  Calculated AUC of 510 (Cmax 31.6; Cmin 13.9) Levels not ordered at this time Adjust as needed for renal function     Temp (24hrs), Avg:98.9 F (37.2 C), Min:98.9 F (37.2 C), Max:98.9 F (37.2 C)  Recent Labs  Lab 03/04/21 1218  WBC 9.9  CREATININE 0.67  LATICACIDVEN 1.5    CrCl cannot be calculated (Unknown ideal weight.).    Allergies  Allergen Reactions  . Oxybutynin Other (See Comments)    Bleeding gums, felt sick    Antimicrobials this admission: Vancomycin 6/7 >>  Dose adjustments this admission: n/a  Thank you for allowing pharmacy to be a part of this patient's care.  Heloise Purpura 03/04/2021 2:20 PM

## 2021-03-05 DIAGNOSIS — G4733 Obstructive sleep apnea (adult) (pediatric): Secondary | ICD-10-CM | POA: Diagnosis not present

## 2021-03-05 DIAGNOSIS — L02416 Cutaneous abscess of left lower limb: Secondary | ICD-10-CM | POA: Diagnosis not present

## 2021-03-05 DIAGNOSIS — Z9989 Dependence on other enabling machines and devices: Secondary | ICD-10-CM

## 2021-03-05 DIAGNOSIS — E785 Hyperlipidemia, unspecified: Secondary | ICD-10-CM | POA: Diagnosis not present

## 2021-03-05 DIAGNOSIS — L03114 Cellulitis of left upper limb: Secondary | ICD-10-CM

## 2021-03-05 LAB — CBC
HCT: 41.3 % (ref 36.0–46.0)
Hemoglobin: 12.8 g/dL (ref 12.0–15.0)
MCH: 31 pg (ref 26.0–34.0)
MCHC: 31 g/dL (ref 30.0–36.0)
MCV: 100 fL (ref 80.0–100.0)
Platelets: 503 10*3/uL — ABNORMAL HIGH (ref 150–400)
RBC: 4.13 MIL/uL (ref 3.87–5.11)
RDW: 14.7 % (ref 11.5–15.5)
WBC: 13.1 10*3/uL — ABNORMAL HIGH (ref 4.0–10.5)
nRBC: 0 % (ref 0.0–0.2)

## 2021-03-05 LAB — BASIC METABOLIC PANEL
Anion gap: 11 (ref 5–15)
BUN: 7 mg/dL — ABNORMAL LOW (ref 8–23)
CO2: 27 mmol/L (ref 22–32)
Calcium: 9.5 mg/dL (ref 8.9–10.3)
Chloride: 100 mmol/L (ref 98–111)
Creatinine, Ser: 0.64 mg/dL (ref 0.44–1.00)
GFR, Estimated: >60 mL/min (ref >60)
Glucose, Bld: 126 mg/dL — ABNORMAL HIGH (ref 70–99)
Potassium: 4.2 mmol/L (ref 3.5–5.1)
Sodium: 138 mmol/L (ref 135–145)

## 2021-03-05 LAB — PROCALCITONIN: Procalcitonin: <0.1 ng/mL

## 2021-03-05 MED ORDER — POLYETHYLENE GLYCOL 3350 17 G PO PACK: 17.0000 g | PACK | Freq: Every day | ORAL | Status: DC | PRN

## 2021-03-05 MED ORDER — OXYCODONE HCL 5 MG PO TABS
5.0000 mg | ORAL_TABLET | ORAL | Status: DC | PRN
  Administered 2021-03-07 – 2021-03-12 (×3): 5 mg via ORAL

## 2021-03-05 MED ORDER — SODIUM CHLORIDE 0.9 % IV SOLN
INTRAVENOUS | Status: DC | PRN
  Administered 2021-03-05: 250 mL via INTRAVENOUS

## 2021-03-05 MED ORDER — PANTOPRAZOLE SODIUM 40 MG PO TBEC
40.0000 mg | DELAYED_RELEASE_TABLET | Freq: Two times a day (BID) | ORAL | Status: DC
  Administered 2021-03-05 – 2021-03-13 (×16): 40 mg via ORAL
  Filled 2021-03-05 (×16): qty 1

## 2021-03-05 MED ORDER — FUROSEMIDE 20 MG PO TABS
20.0000 mg | ORAL_TABLET | Freq: Every day | ORAL | Status: DC
  Administered 2021-03-05 – 2021-03-07 (×3): 20 mg via ORAL
  Filled 2021-03-05 (×3): qty 1

## 2021-03-05 MED ORDER — SPIRONOLACTONE 25 MG PO TABS
25.0000 mg | ORAL_TABLET | Freq: Every day | ORAL | Status: DC
  Administered 2021-03-05 – 2021-03-07 (×3): 25 mg via ORAL
  Filled 2021-03-05 (×3): qty 1

## 2021-03-05 MED ORDER — LEVOTHYROXINE SODIUM 25 MCG PO TABS
125.0000 ug | ORAL_TABLET | Freq: Every day | ORAL | Status: DC
  Administered 2021-03-06 – 2021-03-13 (×7): 125 ug via ORAL
  Filled 2021-03-05 (×7): qty 1

## 2021-03-05 MED ORDER — ALLOPURINOL 300 MG PO TABS
300.0000 mg | ORAL_TABLET | Freq: Every day | ORAL | Status: DC
  Administered 2021-03-05 – 2021-03-13 (×9): 300 mg via ORAL
  Filled 2021-03-05 (×9): qty 1

## 2021-03-05 MED ORDER — DOCUSATE SODIUM 100 MG PO CAPS
100.0000 mg | ORAL_CAPSULE | Freq: Two times a day (BID) | ORAL | Status: DC
  Administered 2021-03-05 – 2021-03-07 (×4): 100 mg via ORAL
  Filled 2021-03-05 (×4): qty 1

## 2021-03-05 MED ORDER — OXYCODONE HCL 5 MG PO TABS
5.0000 mg | ORAL_TABLET | ORAL | Status: DC
  Administered 2021-03-05 – 2021-03-11 (×25): 5 mg via ORAL
  Filled 2021-03-05 (×27): qty 1

## 2021-03-05 MED ORDER — METOCLOPRAMIDE HCL 5 MG PO TABS
5.0000 mg | ORAL_TABLET | Freq: Two times a day (BID) | ORAL | Status: DC
  Administered 2021-03-05 – 2021-03-13 (×16): 5 mg via ORAL
  Filled 2021-03-05 (×15): qty 1

## 2021-03-05 MED ORDER — BACLOFEN 10 MG PO TABS: 10.0000 mg | ORAL_TABLET | Freq: Three times a day (TID) | ORAL | Status: DC | PRN

## 2021-03-05 MED ORDER — CITALOPRAM HYDROBROMIDE 20 MG PO TABS
10.0000 mg | ORAL_TABLET | Freq: Every day | ORAL | Status: DC
  Administered 2021-03-05 – 2021-03-13 (×9): 10 mg via ORAL
  Filled 2021-03-05 (×9): qty 1

## 2021-03-05 MED ORDER — ASPIRIN EC 81 MG PO TBEC
81.0000 mg | DELAYED_RELEASE_TABLET | Freq: Two times a day (BID) | ORAL | Status: DC
  Administered 2021-03-05 – 2021-03-13 (×13): 81 mg via ORAL
  Filled 2021-03-05 (×15): qty 1

## 2021-03-05 MED ORDER — HYDROXYZINE HCL 25 MG PO TABS
25.0000 mg | ORAL_TABLET | Freq: Every evening | ORAL | Status: DC | PRN
  Administered 2021-03-05 – 2021-03-12 (×6): 25 mg via ORAL
  Filled 2021-03-05 (×6): qty 1

## 2021-03-05 NOTE — Progress Notes (Signed)
Patient refused CPAP tonight 

## 2021-03-05 NOTE — ED Notes (Addendum)
Patient is resting comfortable, Bilateral Prevalon boots (from home, came with pt) in place.

## 2021-03-05 NOTE — Plan of Care (Signed)

## 2021-03-05 NOTE — ED Notes (Signed)
Breakfast order placed ?

## 2021-03-05 NOTE — Progress Notes (Addendum)
PROGRESS NOTE    BRIGETTE HOPFER  PPI:[id number] DOB: 12-13-1934 DOA: 03/04/2021 PCP: Maury Dus, MD    Brief Narrative:  86 year old female who was admitted to the hospital with worsening left hip pain and redness.  She recently underwent hemiarthroplasty of the left hip on 02/05/2021.  Over the last few weeks, she reported taking 2 courses of antibiotics for cellulitis of this region without significant benefit.  She was admitted to the hospital for IV antibiotics.  Orthopedics following.   Assessment & Plan:   Principal Problem:   Abscess of hip, left Active Problems:   OSA on CPAP   Morbid obesity with BMI of 45.0-49.9, adult (HCC)   Hypothyroidism   Dyslipidemia   Depression   Cellulitis   Cellulitis of left hip/groin -Concern for underlying abscess versus seroma -Orthopedics following -She has failed several outpatient courses of oral antibiotics -Currently on intravenous antibiotics -Does not appear to be extending past margins that were marked yesterday, although significant erythema persists -May need to have formal I&D  Chronic diastolic congestive heart failure -Compensated -Continue home dose of Lasix and aldactone  Hypothyroidism -Continue on Synthroid  Hypertension -Blood pressure currently stable -Use hydralazine as needed  Obstructive sleep apnea -Continue on CPAP   DVT prophylaxis: enoxaparin (LOVENOX) injection 40 mg Start: 03/04/21 1800 SCDs Start: 03/04/21 1629  Code Status: DNR Family Communication: updated patient's niece [name] over the phone Disposition Plan: Status is: Inpatient  Remains inpatient appropriate because:IV treatments appropriate due to intensity of illness or inability to take PO and Inpatient level of care appropriate due to severity of illness   Dispo: The patient is from: SNF              Anticipated d/c is to: SNF              Patient currently is not medically stable to d/c.   Difficult to place patient  No         Consultants:  ortho  Procedures:    Antimicrobials:  Vancomycin 6/7> Zosyn 6/7>   Subjective: Denies any worsening pain in her hip  Objective: Vitals:   03/05/21 0400 03/05/21 0600 03/05/21 0737 03/05/21 0815  BP: (!) 156/84 (!) 174/84  (!) 149/90  Pulse: 69 67  74  Resp: (!) 21 (!) 23  18  Temp:   98.4 F (36.9 C)   TempSrc:   Oral   SpO2: 94% 95%  96%  Weight:      Height:       No intake or output data in the 24 hours ending 03/05/21 1032 Filed Weights   03/04/21 1430 03/05/21 0103  Weight: 110.1 kg 108.9 kg    Examination:  General exam: Appears calm and comfortable  Respiratory system: Clear to auscultation. Respiratory effort normal. Cardiovascular system: S1 & S2 heard, RRR. No JVD, murmurs, rubs, gallops or clicks. No pedal edema. Gastrointestinal system: Abdomen is nondistended, soft and nontender. No organomegaly or masses felt. Normal bowel sounds heard. Central nervous system: Alert and oriented. No focal neurological deficits. Extremities: Symmetric 5 x 5 power. Skin: large area of erythema and swelling over left hip. Erythema does not extend passed the margins that were marked from yesterday Psychiatry: Judgement and insight appear normal. Mood & affect appropriate.     Data Reviewed: I have personally reviewed following labs and imaging studies  CBC: Recent Labs  Lab 03/04/21 1218 03/05/21 0326  WBC 9.9 13.1*  NEUTROABS 7.6  --   HGB 11.5* 12.8  HCT 36.8 41.3  MCV 100.0 100.0  PLT 503* 867*   Basic Metabolic Panel: Recent Labs  Lab 03/04/21 1218 03/05/21 0755  NA 137 138  K 3.9 4.2  CL 97* 100  CO2 33* 27  GLUCOSE 164* 126*  BUN 9 7*  CREATININE 0.67 0.64  CALCIUM 9.2 9.5   GFR: Estimated Creatinine Clearance: 64.3 mL/min (by C-G formula based on SCr of 0.64 mg/dL). Liver Function Tests: Recent Labs  Lab 03/04/21 1218  AST 22  ALT 13  ALKPHOS 138*  BILITOT 0.5  PROT 5.2*  ALBUMIN 2.4*   No  results for input(s): LIPASE, AMYLASE in the last 168 hours. No results for input(s): AMMONIA in the last 168 hours. Coagulation Profile: No results for input(s): INR, PROTIME in the last 168 hours. Cardiac Enzymes: No results for input(s): CKTOTAL, CKMB, CKMBINDEX, TROPONINI in the last 168 hours. BNP (last 3 results) No results for input(s): PROBNP in the last 8760 hours. HbA1C: No results for input(s): HGBA1C in the last 72 hours. CBG: No results for input(s): GLUCAP in the last 168 hours. Lipid Profile: No results for input(s): CHOL, HDL, LDLCALC, TRIG, CHOLHDL, LDLDIRECT in the last 72 hours. Thyroid Function Tests: No results for input(s): TSH, T4TOTAL, FREET4, T3FREE, THYROIDAB in the last 72 hours. Anemia Panel: No results for input(s): VITAMINB12, FOLATE, FERRITIN, TIBC, IRON, RETICCTPCT in the last 72 hours. Sepsis Labs: Recent Labs  Lab 03/04/21 1218 03/04/21 1629 03/05/21 0326  PROCALCITON  --  <0.10 <0.10  LATICACIDVEN 1.5  --   --     Recent Results (from the past 240 hour(s))  Blood culture (routine x 2)     Status: None (Preliminary result)   Collection Time: 03/04/21 12:07 PM   Specimen: BLOOD  Result Value Ref Range Status   Specimen Description BLOOD LEFT ANTECUBITAL  Final   Special Requests   Final    BOTTLES DRAWN AEROBIC AND ANAEROBIC Blood Culture adequate volume   Culture   Final    NO GROWTH < 24 HOURS Performed at [facility], [place] [address] [place], [place] [address]    Report Status PENDING  Incomplete  Blood culture (routine x 2)     Status: None (Preliminary result)   Collection Time: 03/04/21  4:09 PM   Specimen: BLOOD  Result Value Ref Range Status   Specimen Description BLOOD SITE NOT SPECIFIED  Final   Special Requests   Final    BOTTLES DRAWN AEROBIC AND ANAEROBIC Blood Culture results may not be optimal due to an inadequate volume of blood received in culture bottles   Culture   Final    NO GROWTH < 24 HOURS Performed at  [facility], [place] [address] [place], [place] [address]    Report Status PENDING  Incomplete  Resp Panel by RT-PCR (Flu A&B, Covid) Nasopharyngeal Swab     Status: None   Collection Time: 03/04/21  5:24 PM   Specimen: Nasopharyngeal Swab; Nasopharyngeal(NP) swabs in vial transport medium  Result Value Ref Range Status   SARS Coronavirus 2 by RT PCR NEGATIVE NEGATIVE Final    Comment: (NOTE) SARS-CoV-2 target nucleic acids are NOT DETECTED.  The SARS-CoV-2 RNA is generally detectable in upper respiratory specimens during the acute phase of infection. The lowest concentration of SARS-CoV-2 viral copies this assay can detect is 138 copies/mL. A negative result does not preclude SARS-Cov-2 infection and should not be used as the sole basis for treatment or other patient management  decisions. A negative result may occur with  improper specimen collection/handling, submission of specimen other than nasopharyngeal swab, presence of viral mutation(s) within the areas targeted by this assay, and inadequate number of viral copies(<138 copies/mL). A negative result must be combined with clinical observations, patient history, and epidemiological information. The expected result is Negative.  Fact Sheet for Patients:  EntrepreneurPulse.com.au  Fact Sheet for Healthcare Providers:  IncredibleEmployment.be  This test is no t yet approved or cleared by the [place] FDA and  has been authorized for detection and/or diagnosis of SARS-CoV-2 by FDA under an Emergency Use Authorization (EUA). This EUA will remain  in effect (meaning this test can be used) for the duration of the COVID-19 declaration under Section 564(b)(1) of the Act, 21 U.S.C.section 360bbb-3(b)(1), unless the authorization is terminated  or revoked sooner.       Influenza A by PCR NEGATIVE NEGATIVE Final   Influenza B by PCR NEGATIVE NEGATIVE Final    Comment: (NOTE) The Xpert  Xpress SARS-CoV-2/FLU/RSV plus assay is intended as an aid in the diagnosis of influenza from Nasopharyngeal swab specimens and should not be used as a sole basis for treatment. Nasal washings and aspirates are unacceptable for Xpert Xpress SARS-CoV-2/FLU/RSV testing.  Fact Sheet for Patients: EntrepreneurPulse.com.au  Fact Sheet for Healthcare Providers: IncredibleEmployment.be  This test is not yet approved or cleared by the [place] FDA and has been authorized for detection and/or diagnosis of SARS-CoV-2 by FDA under an Emergency Use Authorization (EUA). This EUA will remain in effect (meaning this test can be used) for the duration of the COVID-19 declaration under Section 564(b)(1) of the Act, 21 U.S.C. section 360bbb-3(b)(1), unless the authorization is terminated or revoked.  Performed at [facility], [place] [address], [place], [place] [address]          Radiology Studies: CT Hip Left Wo Contrast  Result Date: 03/04/2021 CLINICAL DATA:  Left hip redness, soft tissue infection. History of left hip arthroplasty on 02/05/2021 EXAM: CT OF THE LEFT HIP WITHOUT CONTRAST TECHNIQUE: Multidetector CT imaging of the left hip was performed according to the standard protocol. Multiplanar CT image reconstructions were also generated. COMPARISON:  02/05/2021, 02/03/2021 x-ray FINDINGS: Bones/Joint/Cartilage Postsurgical changes from left hip hemiarthroplasty. No evidence of periprosthetic fracture or lucency. Ill-defined mineralization within the soft tissues adjacent to the greater and lesser trochanters, which may reflect developing heterotopic ossification. No appreciable periprosthetic fluid collection. Ligaments Suboptimally assessed by CT. Muscles and Tendons Mild atrophy of the proximal thigh musculature. No acute musculotendinous abnormality by CT. Soft tissues Lobulated fluid and air containing collection within the subcutaneous soft  tissues overlying the anterolateral aspect of the left hip and proximal thigh measuring approximately 11.2 x 5.0 x 8.3 cm. Collection abuts the does not extend deep to the superficial investing fascia of the proximal thigh musculature. There is surrounding subcutaneous edema. Overlying skin thickening. No gas within the surrounding soft tissues outside of the collection. Mildly prominent left inguinal lymph nodes, likely reactive. IMPRESSION: 1. Lobulated fluid and air containing collection within the subcutaneous soft tissues overlying the anterolateral aspect of the left hip and proximal thigh measuring approximately 11 x 5 x 8 cm, concerning for abscess versus infected postoperative seroma or hematoma. 2. Postsurgical changes from left hip hemiarthroplasty without evidence of periprosthetic fracture or loosening. No periprosthetic fluid collection. Previously described fluid collection does not appear to communicate with the left hip joint space. 3. Mildly prominent left inguinal lymph nodes, likely reactive. Electronically Signed  By: Davina Poke D.O.   On: 03/04/2021 13:10        Scheduled Meds:  enoxaparin (LOVENOX) injection  40 mg Subcutaneous Q24H   Continuous Infusions:  piperacillin-tazobactam (ZOSYN)  IV 3.375 g (03/05/21 0609)   vancomycin       LOS: 1 day    Time spent: 10mins    Kathie Dike, MD [facility]   If 7PM-7AM, please contact night-coverage [email or web address]  03/05/2021, 10:32 AM

## 2021-03-06 ENCOUNTER — Encounter (HOSPITAL_COMMUNITY): Payer: Self-pay | Admitting: Internal Medicine

## 2021-03-06 DIAGNOSIS — L03114 Cellulitis of left upper limb: Secondary | ICD-10-CM | POA: Diagnosis not present

## 2021-03-06 DIAGNOSIS — G4733 Obstructive sleep apnea (adult) (pediatric): Secondary | ICD-10-CM | POA: Diagnosis not present

## 2021-03-06 DIAGNOSIS — L02416 Cutaneous abscess of left lower limb: Secondary | ICD-10-CM | POA: Diagnosis not present

## 2021-03-06 DIAGNOSIS — I5032 Chronic diastolic (congestive) heart failure: Secondary | ICD-10-CM | POA: Diagnosis present

## 2021-03-06 DIAGNOSIS — E785 Hyperlipidemia, unspecified: Secondary | ICD-10-CM | POA: Diagnosis not present

## 2021-03-06 LAB — CBC
HCT: 37.7 % (ref 36.0–46.0)
Hemoglobin: 11.9 g/dL — ABNORMAL LOW (ref 12.0–15.0)
MCH: 30.8 pg (ref 26.0–34.0)
MCHC: 31.6 g/dL (ref 30.0–36.0)
MCV: 97.7 fL (ref 80.0–100.0)
Platelets: 483 10*3/uL — ABNORMAL HIGH (ref 150–400)
RBC: 3.86 MIL/uL — ABNORMAL LOW (ref 3.87–5.11)
RDW: 14.8 % (ref 11.5–15.5)
WBC: 13.2 10*3/uL — ABNORMAL HIGH (ref 4.0–10.5)
nRBC: 0 % (ref 0.0–0.2)

## 2021-03-06 LAB — BASIC METABOLIC PANEL
Anion gap: 7 (ref 5–15)
BUN: 5 mg/dL — ABNORMAL LOW (ref 8–23)
CO2: 29 mmol/L (ref 22–32)
Calcium: 9.4 mg/dL (ref 8.9–10.3)
Chloride: 101 mmol/L (ref 98–111)
Creatinine, Ser: 0.78 mg/dL (ref 0.44–1.00)
GFR, Estimated: >60 mL/min (ref >60)
Glucose, Bld: 150 mg/dL — ABNORMAL HIGH (ref 70–99)
Potassium: 4 mmol/L (ref 3.5–5.1)
Sodium: 137 mmol/L (ref 135–145)

## 2021-03-06 NOTE — Progress Notes (Signed)
Patient refused CPAP HS tonight. Patient stated she doesn't wear CPAP at home.

## 2021-03-06 NOTE — Progress Notes (Signed)
Patient ID: Melinda Kerr, female   DOB: April 28, 1935, 86 y.o.   MRN: [id number]   LOS: 2 days   Subjective: Pt feeling better, less pain and thinks her hip looks better.   Objective: Vital signs in last 24 hours: Temp:  [98.3 F (36.8 C)-98.9 F (37.2 C)] 98.4 F (36.9 C) (06/09 0757) Pulse Rate:  [66-98] 87 (06/09 0757) Resp:  [16-21] 16 (06/09 0757) BP: (108-181)/(62-100) 116/62 (06/09 0757) SpO2:  [91 %-98 %] 98 % (06/09 0757) Last BM Date: 03/06/21   Laboratory  CBC Recent Labs    03/05/21 0326 03/06/21 0150  WBC 13.1* 13.2*  HGB 12.8 11.9*  HCT 41.3 37.7  PLT 503* 483*   BMET Recent Labs    03/05/21 0755 03/06/21 0150  NA 138 137  K 4.2 4.0  CL 100 101  CO2 27 29  GLUCOSE 126* 150*  BUN 7* 5*  CREATININE 0.64 0.78  CALCIUM 9.5 9.4     Physical Exam Left hip: Erythema decreased in intensity and receded from marked margins. Small fluctuant area has arisen that is weeping, appears serous from pads, on lateral thigh about 4x6cm. No TTP, incision C/D/I.   Assessment/Plan: S/p left hip replacement -- Encouraged by overall improvement but with new fluctuant area will ask IR to obtain sample of fluid collection to make sure not abscess.    Lisette Abu, PA-C Orthopedic Surgery [phone] 03/06/2021

## 2021-03-06 NOTE — Progress Notes (Signed)
PROGRESS NOTE    Melinda Kerr  PPJ:[id number] DOB: [birth year] DOA: 03/04/2021 PCP: Maury Dus, MD    Brief Narrative:  86 year old female who was admitted to the hospital with worsening left hip pain and redness.  She recently underwent hemiarthroplasty of the left hip on 02/05/2021.  Over the last few weeks, she reported taking 2 courses of antibiotics for cellulitis of this region without significant benefit.  She was admitted to the hospital for IV antibiotics.  Orthopedics following.   Assessment & Plan:   Principal Problem:   Abscess of hip, left Active Problems:   OSA on CPAP   Hypothyroidism   Dyslipidemia   Depression   Essential hypertension   Cellulitis   Chronic diastolic CHF (congestive heart failure) (HCC)   Cellulitis of left hip/groin -Concern for underlying abscess versus seroma -Orthopedics following -She has failed several outpatient courses of oral antibiotics -Currently on intravenous antibiotics -overall erythema appears to be improving -there is a small fluctuant area that has formed -IR consulted for drainage  Chronic diastolic congestive heart failure -Compensated -Continue home dose of Lasix and aldactone  Hypothyroidism -Continue on Synthroid  Hypertension -Blood pressure currently stable -Use hydralazine as needed  Obstructive sleep apnea -Continue on CPAP   DVT prophylaxis: enoxaparin (LOVENOX) injection 40 mg Start: 03/04/21 1800 SCDs Start: 03/04/21 1629  Code Status: DNR Family Communication: updated patient's niece [name] over the phone Disposition Plan: Status is: Inpatient  Remains inpatient appropriate because:IV treatments appropriate due to intensity of illness or inability to take PO and Inpatient level of care appropriate due to severity of illness   Dispo: The patient is from: SNF              Anticipated d/c is to: SNF              Patient currently is not medically stable to d/c.   Difficult to place patient  No         Consultants:  ortho  Procedures:    Antimicrobials:  Vancomycin 6/7> Zosyn 6/7>   Subjective: Denies any pain in her hip, no shortness of breath  Objective: Vitals:   03/05/21 1351 03/05/21 1653 03/05/21 2127 03/06/21 0757  BP: 108/77 (!) 125/100 122/86 116/62  Pulse: 98 75 78 87  Resp: 18 17 18 16   Temp: 98.9 F (37.2 C) 98.9 F (37.2 C) 98.3 F (36.8 C) 98.4 F (36.9 C)  TempSrc: Oral Oral Oral Oral  SpO2: 96% 92% 92% 98%  Weight:      Height:        Intake/Output Summary (Last 24 hours) at 03/06/2021 1343 Last data filed at 03/06/2021 0650 Gross per 24 hour  Intake --  Output 1600 ml  Net -1600 ml   Filed Weights   03/04/21 1430 03/05/21 0103  Weight: 110.1 kg 108.9 kg    Examination:  General exam: Alert, awake, oriented x 3 Respiratory system: Clear to auscultation. Respiratory effort normal. Cardiovascular system:RRR. No murmurs, rubs, gallops. Gastrointestinal system: Abdomen is nondistended, soft and nontender. No organomegaly or masses felt. Normal bowel sounds heard. Central nervous system: Alert and oriented. No focal neurological deficits. Extremities: No C/C/E, +pedal pulses Skin: overall erythema in left hip appears to be improving Psychiatry: Judgement and insight appear normal. Mood & affect appropriate.      Data Reviewed: I have personally reviewed following labs and imaging studies  CBC: Recent Labs  Lab 03/04/21 1218 03/05/21 0326 03/06/21 0150  WBC 9.9 13.1* 13.2*  NEUTROABS  7.6  --   --   HGB 11.5* 12.8 11.9*  HCT 36.8 41.3 37.7  MCV 100.0 100.0 97.7  PLT 503* 503* 592*   Basic Metabolic Panel: Recent Labs  Lab 03/04/21 1218 03/05/21 0755 03/06/21 0150  NA 137 138 137  K 3.9 4.2 4.0  CL 97* 100 101  CO2 33* 27 29  GLUCOSE 164* 126* 150*  BUN 9 7* 5*  CREATININE 0.67 0.64 0.78  CALCIUM 9.2 9.5 9.4   GFR: Estimated Creatinine Clearance: 64.3 mL/min (by C-G formula based on SCr of 0.78  mg/dL). Liver Function Tests: Recent Labs  Lab 03/04/21 1218  AST 22  ALT 13  ALKPHOS 138*  BILITOT 0.5  PROT 5.2*  ALBUMIN 2.4*   No results for input(s): LIPASE, AMYLASE in the last 168 hours. No results for input(s): AMMONIA in the last 168 hours. Coagulation Profile: No results for input(s): INR, PROTIME in the last 168 hours. Cardiac Enzymes: No results for input(s): CKTOTAL, CKMB, CKMBINDEX, TROPONINI in the last 168 hours. BNP (last 3 results) No results for input(s): PROBNP in the last 8760 hours. HbA1C: No results for input(s): HGBA1C in the last 72 hours. CBG: No results for input(s): GLUCAP in the last 168 hours. Lipid Profile: No results for input(s): CHOL, HDL, LDLCALC, TRIG, CHOLHDL, LDLDIRECT in the last 72 hours. Thyroid Function Tests: No results for input(s): TSH, T4TOTAL, FREET4, T3FREE, THYROIDAB in the last 72 hours. Anemia Panel: No results for input(s): VITAMINB12, FOLATE, FERRITIN, TIBC, IRON, RETICCTPCT in the last 72 hours. Sepsis Labs: Recent Labs  Lab 03/04/21 1218 03/04/21 1629 03/05/21 0326  PROCALCITON  --  <0.10 <0.10  LATICACIDVEN 1.5  --   --     Recent Results (from the past 240 hour(s))  Blood culture (routine x 2)     Status: None (Preliminary result)   Collection Time: 03/04/21 12:07 PM   Specimen: BLOOD  Result Value Ref Range Status   Specimen Description BLOOD LEFT ANTECUBITAL  Final   Special Requests   Final    BOTTLES DRAWN AEROBIC AND ANAEROBIC Blood Culture adequate volume   Culture   Final    NO GROWTH 2 DAYS Performed at [facility], [place] [address] [place], [place] [address]    Report Status PENDING  Incomplete  Blood culture (routine x 2)     Status: None (Preliminary result)   Collection Time: 03/04/21  4:09 PM   Specimen: BLOOD  Result Value Ref Range Status   Specimen Description BLOOD SITE NOT SPECIFIED  Final   Special Requests   Final    BOTTLES DRAWN AEROBIC AND ANAEROBIC Blood Culture results  may not be optimal due to an inadequate volume of blood received in culture bottles   Culture   Final    NO GROWTH 2 DAYS Performed at [facility], [place] [address], [place], [place] [address]    Report Status PENDING  Incomplete  Resp Panel by RT-PCR (Flu A&B, Covid) Nasopharyngeal Swab     Status: None   Collection Time: 03/04/21  5:24 PM   Specimen: Nasopharyngeal Swab; Nasopharyngeal(NP) swabs in vial transport medium  Result Value Ref Range Status   SARS Coronavirus 2 by RT PCR NEGATIVE NEGATIVE Final    Comment: (NOTE) SARS-CoV-2 target nucleic acids are NOT DETECTED.  The SARS-CoV-2 RNA is generally detectable in upper respiratory specimens during the acute phase of infection. The lowest concentration of SARS-CoV-2 viral copies this assay can detect is 138  copies/mL. A negative result does not preclude SARS-Cov-2 infection and should not be used as the sole basis for treatment or other patient management decisions. A negative result may occur with  improper specimen collection/handling, submission of specimen other than nasopharyngeal swab, presence of viral mutation(s) within the areas targeted by this assay, and inadequate number of viral copies(<138 copies/mL). A negative result must be combined with clinical observations, patient history, and epidemiological information. The expected result is Negative.  Fact Sheet for Patients:  EntrepreneurPulse.com.au  Fact Sheet for Healthcare Providers:  IncredibleEmployment.be  This test is no t yet approved or cleared by the [place] FDA and  has been authorized for detection and/or diagnosis of SARS-CoV-2 by FDA under an Emergency Use Authorization (EUA). This EUA will remain  in effect (meaning this test can be used) for the duration of the COVID-19 declaration under Section 564(b)(1) of the Act, 21 U.S.C.section 360bbb-3(b)(1), unless the authorization is terminated  or revoked  sooner.       Influenza A by PCR NEGATIVE NEGATIVE Final   Influenza B by PCR NEGATIVE NEGATIVE Final    Comment: (NOTE) The Xpert Xpress SARS-CoV-2/FLU/RSV plus assay is intended as an aid in the diagnosis of influenza from Nasopharyngeal swab specimens and should not be used as a sole basis for treatment. Nasal washings and aspirates are unacceptable for Xpert Xpress SARS-CoV-2/FLU/RSV testing.  Fact Sheet for Patients: EntrepreneurPulse.com.au  Fact Sheet for Healthcare Providers: IncredibleEmployment.be  This test is not yet approved or cleared by the [place] FDA and has been authorized for detection and/or diagnosis of SARS-CoV-2 by FDA under an Emergency Use Authorization (EUA). This EUA will remain in effect (meaning this test can be used) for the duration of the COVID-19 declaration under Section 564(b)(1) of the Act, 21 U.S.C. section 360bbb-3(b)(1), unless the authorization is terminated or revoked.  Performed at [facility], [place] [address] [place], [place] [address]          Radiology Studies: No results found.       Scheduled Meds:  allopurinol  300 mg Oral Daily   aspirin EC  81 mg Oral BID WC   citalopram  10 mg Oral Daily   docusate sodium  100 mg Oral BID   enoxaparin (LOVENOX) injection  40 mg Subcutaneous Q24H   furosemide  20 mg Oral Daily   levothyroxine  125 mcg Oral QAC breakfast   metoCLOPramide  5 mg Oral BID AC & HS   oxyCODONE  5 mg Oral Q4H while awake   pantoprazole  40 mg Oral BID AC   spironolactone  25 mg Oral Daily   Continuous Infusions:  sodium chloride 250 mL (03/05/21 1457)   piperacillin-tazobactam (ZOSYN)  IV 3.375 g (03/06/21 0718)   vancomycin 1,000 mg (03/05/21 1954)     LOS: 2 days    Time spent: 63mins    Kathie Dike, MD [facility]   If 7PM-7AM, please contact night-coverage [email or web address]  03/06/2021, 1:43 PM

## 2021-03-06 NOTE — H&P (Signed)
Chief Complaint: Patient was seen in consultation today for image guided aspiration and paossible drain placement of a subcutaneous fluid collection overlying the anterolateral aspect of the left hip Chief Complaint  Patient presents with   Hip Pain    Redness and r/o infection   at the request of Silvestre Gunner PA-C   Referring Physician(s): Silvestre Gunner PA-C   Supervising Physician: Aletta Edouard  Patient Status: [facility] - In-pt  History of Present Illness: Melinda Kerr is a 86 y.o. female with past medical history of CHF, hypertension, hyperlipidemia, hypothyroidism, OSA on CPAP, pneumonia, DM type II, uterine cancer s/p chemoradiation and hysterectomy, diverticulitis of large intestine with abscess in 2018, osteoarthritis s/p left total hip replacement on 02/05/2021 who was referred to IR for a image guided aspiration and possible drain placement of a subcutaneous fluid collection overlying anterolateral aspect of the left hip. Patient had a follow-up visit with Dr. Lyla Glassing in the office on 03/04/2021 and was found to have redness of the skin over the left hip, was recommended to go to ED for further evaluation.  Patient was afebrile and had no leukocytosis at the presentation to ED. Patient underwent CT of left hip which showed:  1. Lobulated fluid and air containing collection within the subcutaneous soft tissues overlying the anterolateral aspect of the left hip and proximal thigh measuring approximately 11 x 5 x 8 cm, concerning for abscess versus infected postoperative seroma or hematoma. 2. Postsurgical changes from left hip hemiarthroplasty without evidence of periprosthetic fracture or loosening. No periprosthetic fluid collection. Previously described fluid collection does not appear to communicate with the left hip joint space. 3. Mildly prominent left inguinal lymph nodes, likely reactive.  Patient was started on abx and admitted for further evaluation and  managament of the sq fluid collection over left hip. Patient remained afebrile upon admission but developed leukocytopenia on 03/05/21 despite being treated with abx. Orthopedics was consulted for the fluid collection, who recommended IR consult for aspiration and possible drain placement.   IR was requested for image guided aspiration and possible drain placement of the left sq fluid collection over left hip.   Patient laying in bed, not in acute distress.  Denies left hip pain currently.  Denise headache, fever, chills, shortness of breath, cough, chest pain, abdominal pain, nausea ,vomiting, and bleeding.    Past Medical History:  Diagnosis Date   Arthritis    "shoulders, knees, lower back" (07/14/2017)   CHF (congestive heart failure) ([place])    "while in hospital w/hysterectomy in 2017"   Chronic lower back pain    Depression    Diverticulitis of large intestine with abscess 07/13/2017   DVT (deep venous thrombosis) (HCC)    Dyspnea    "cause I'm over weight"   Esophageal reflux    Gout    Hyperlipidemia    under control   Hypertension    Hypothyroidism    Morbid obesity with BMI of 40.0-44.9, adult ([place])    Nocturia    OSA on CPAP    "not wearing it when I sleep in my lift chair" (07/14/2017)   Osteoarthritis    Phlebitis    hx of   Pneumonia ?1989   Spondylosis    with scoliosis   Type II diabetes mellitus (HCC)    borderline , diet controlled    Urinary frequency    Uterine cancer (HCC)    S/P chemo, radiation, hysterectomy    Past Surgical History:  Procedure Laterality Date  ANTERIOR APPROACH HEMI HIP ARTHROPLASTY Left 02/05/2021   Procedure: ANTERIOR APPROACH HEMI HIP ARTHROPLASTY;  Surgeon: Rod Can, MD;  Location: [facility];  Service: Orthopedics;  Laterality: Left;   APPENDECTOMY     CATARACT EXTRACTION, BILATERAL Bilateral 2018   COLONOSCOPY     DILATATION & CURETTAGE/HYSTEROSCOPY WITH MYOSURE N/A 03/04/2016   Procedure: DILATATION &  CURETTAGE/HYSTEROSCOPY ;  Surgeon: Azucena Fallen, MD;  Location: [facility];  Service: Gynecology;  Laterality: N/A;  polyp removal    ESOPHAGOGASTRODUODENOSCOPY (EGD) WITH PROPOFOL N/A 11/14/2018   Procedure: ESOPHAGOGASTRODUODENOSCOPY (EGD) WITH PROPOFOL;  Surgeon: Wonda Horner, MD;  Location: [facility];  Service: Endoscopy;  Laterality: N/A;   FEMUR IM NAIL Right 04/09/2019   Procedure: INTRAMEDULLARY (IM) NAIL INTERTROCH;  Surgeon: Renette Butters, MD;  Location: [place];  Service: Orthopedics;  Laterality: Right;   HARDWARE REMOVAL Right 05/09/2020   Procedure: HARDWARE REMOVAL RIGHT FEMUR;  Surgeon: Shona Needles, MD;  Location: [place];  Service: Orthopedics;  Laterality: Right;   HERNIA REPAIR     IR GENERIC HISTORICAL  05/15/2016   IR US GUIDE VASC ACCESS RIGHT 05/15/2016 Aletta Edouard, MD [facility]   IR GENERIC HISTORICAL  05/15/2016   IR FLUORO GUIDE CV LINE RIGHT 05/15/2016 Aletta Edouard, MD [facility]   IR REMOVAL TUN ACCESS W/ PORT W/O FL MOD SED  04/19/2018   LAPAROSCOPIC CHOLECYSTECTOMY  2004   LAPAROSCOPIC INCISIONAL / UMBILICAL / VENTRAL HERNIA REPAIR  2004   "w/gallbladder OR"   LYMPH NODE BIOPSY N/A 03/26/2016   Procedure: Sentinel LYMPH NODE BIOPSY;  Surgeon: Everitt Amber, MD;  Location: [facility];  Service: Gynecology;  Laterality: N/A;   ORIF FEMUR FRACTURE Right 05/09/2020   Procedure: OPEN REDUCTION INTERNAL FIXATION (ORIF) DISTAL FEMUR FRACTURE;  Surgeon: Shona Needles, MD;  Location: [place];  Service: Orthopedics;  Laterality: Right;   REVERSE SHOULDER ARTHROPLASTY Right 10/26/2018   Procedure: REVERSE SHOULDER ARTHROPLASTY;  Surgeon: Nicholes Stairs, MD;  Location: [place];  Service: Orthopedics;  Laterality: Right;   ROBOTIC ASSISTED TOTAL HYSTERECTOMY WITH BILATERAL SALPINGO OOPHERECTOMY N/A 03/26/2016   Procedure: XI ROBOTIC ASSISTED TOTAL Laproscopic HYSTERECTOMY WITH BILATERAL SALPINGO OOPHORECTOMY;  Surgeon: Everitt Amber, MD;  Location: [facility];  Service:  Gynecology;  Laterality: N/A;   TOTAL KNEE ARTHROPLASTY Left 10/09/2013   Procedure: LEFT TOTAL KNEE ARTHROPLASTY;  Surgeon: [name], MD;  Location: [facility];  Service: Orthopedics;  Laterality: Left;   TOTAL KNEE ARTHROPLASTY Right 04/09/2014   Procedure: RIGHT TOTAL KNEE ARTHROPLASTY;  Surgeon: [name], MD;  Location: [facility];  Service: Orthopedics;  Laterality: Right;   TUBAL LIGATION  1982   VARICOSE VEIN SURGERY Bilateral 1968   WISDOM TOOTH EXTRACTION  1963    Allergies: Oxybutynin  Medications: Prior to Admission medications   Medication Sig Start Date End Date Taking? Authorizing Provider  acetaminophen (TYLENOL) 500 MG tablet Take 2 tablets (1,000 mg total) by mouth every 8 (eight) hours. 02/06/21 03/08/21 Yes Cherlynn June B, PA  albuterol (VENTOLIN HFA) 108 (90 Base) MCG/ACT inhaler Inhale 2 puffs into the lungs every 8 (eight) hours as needed for wheezing.   Yes [provider]  allopurinol (ZYLOPRIM) 300 MG tablet Take 300 mg by mouth daily.    Yes [provider]  aspirin EC 81 MG tablet Take 1 tablet (81 mg total) by mouth 2 (two) times daily with a meal. Swallow whole. 02/06/21 03/23/21 Yes Cherlynn June B, PA  baclofen (LIORESAL) 10 MG  tablet Take 1 tablet (10 mg total) by mouth 3 (three) times daily as needed for muscle spasms. 04/12/19  Yes Swayze, Ava, DO  calcium carbonate (TUMS - DOSED IN MG ELEMENTAL CALCIUM) 500 MG chewable tablet Chew 1 tablet by mouth daily.   Yes [provider]  cefadroxil (DURICEF) 500 MG capsule Take 500 mg by mouth 2 (two) times daily. 02/21/21 03/08/21 Yes [provider]  citalopram (CELEXA) 10 MG tablet Take 10 mg by mouth daily. 03/20/19  Yes [provider]  diclofenac Sodium (VOLTAREN) 1 % GEL Apply 2 g topically every 12 (twelve) hours as needed (pain).   Yes [provider]  docusate sodium (COLACE) 100 MG capsule Take 1 capsule (100 mg total) by mouth 2 (two) times daily.  05/16/19  Yes [place] Eric J, DO  furosemide (LASIX) 40 MG tablet Take 0.5 tablets (20 mg total) by mouth daily. 05/11/20  Yes Lavina Hamman, MD  hydrOXYzine (ATARAX/VISTARIL) 25 MG tablet Take 25 mg by mouth at bedtime.   Yes [provider]  levothyroxine (SYNTHROID, LEVOTHROID) 125 MCG tablet Take 125 mcg by mouth daily before breakfast.   Yes [provider]  metoCLOPramide (REGLAN) 5 MG tablet Take 5 mg by mouth 2 (two) times daily at 8 am and 10 pm.   Yes [provider]  oxyCODONE (OXY IR/ROXICODONE) 5 MG immediate release tablet Take 5 mg by mouth See admin instructions. Take 5 mg by mouth every four hours while awake and an additional 5 mg every four hours as needed for severe pain   Yes [provider]  OXYGEN Inhale 2 L/min into the lungs See admin instructions. 2 L/min every shift for shortness of breath and every 8 hours as needed to maintain sats of at least 90%   Yes [provider]  pantoprazole (PROTONIX) 40 MG tablet Take 1 tablet (40 mg total) by mouth 2 (two) times daily before a meal. 11/17/18  Yes Gherghe, Vella Redhead, MD  polyethylene glycol (MIRALAX / GLYCOLAX) packet Take 17 g by mouth daily as needed for mild constipation or moderate constipation. Patient taking differently: Take 17 g by mouth daily as needed for mild constipation (MIX AND DRINK). 04/12/14  Yes Perkins, Alexzandrew L, PA-C  Polysaccharide-Iron Complex 150 MG CAPS Take 150 mg by mouth in the morning.   Yes [provider]  senna-docusate (SENOKOT-S) 8.6-50 MG tablet Take 1 tablet by mouth daily.   Yes [provider]  Simethicone 250 MG CAPS Take 250 mg by mouth every 8 (eight) hours as needed (gas).   Yes [provider]  spironolactone (ALDACTONE) 25 MG tablet Take 25 mg by mouth daily.   Yes [provider]  Vitamin D, Ergocalciferol, (DRISDOL) 1.25 MG (50000 UT) CAPS capsule Take 50,000 Units by mouth every Monday.   Yes [provider]  cetirizine (ZYRTEC) 10 MG tablet Take 10 mg by mouth daily. Patient not taking: Reported on 03/04/2021    [provider]  iron polysaccharides (NIFEREX) 150 MG capsule Take 150 mg by mouth daily. Patient not taking: Reported on 03/04/2021    [provider]  nystatin (MYCOSTATIN/NYSTOP) powder Apply 1 Bottle topically See admin instructions. Apply topically to abdominal skin folds and under bilateral breast for intertrigo - twice daily - please make sure skin has been cleaned and is dry before applying    [provider]  Omega-3 Fatty Acids (FISH OIL) 1000 MG CAPS Take 1,000 mg by mouth daily.  Patient not taking: Reported on 03/04/2021    [provider]  pyridOXINE (VITAMIN B-6) 100 MG tablet Take 100 mg by mouth daily. Patient not taking: Reported on 03/04/2021    [provider]  saccharomyces boulardii (FLORASTOR) 250 MG capsule Take 250 mg by mouth 2 (two) times daily. Patient not taking: Reported on 03/04/2021    [provider]  vitamin B-12 (CYANOCOBALAMIN) 1000 MCG tablet Take 1,000 mcg by mouth daily.  Patient not taking: Reported on 03/04/2021    [provider]  vitamin C (ASCORBIC ACID) 500 MG tablet Take 500 mg by mouth daily. Patient not taking: Reported on 03/04/2021    [provider]     Family History  Problem Relation Age of Onset   Heart disease Mother    Heart disease Father    CVA Father     Social History   Socioeconomic History   Marital status: Widowed    Spouse name: Not on file   Number of children: 0   Years of education: Not on file   Highest education level: Not on file  Occupational History   Occupation: Tax collection retired  Tobacco Use   Smoking status: Never   Smokeless tobacco: Never  Vaping Use   Vaping Use: Never used  Substance and Sexual Activity   Alcohol use: No   Drug use: No   Sexual activity: Never  Other Topics Concern   Not on file  Social History  Narrative   Not on file   Social Determinants of Health   Financial Resource Strain: Not on file  Food Insecurity: Not on file  Transportation Needs: Not on file  Physical Activity: Not on file  Stress: Not on file  Social Connections: Not on file     Review of Systems: A 12 point ROS discussed and pertinent positives are indicated in the HPI above.  All other systems are negative.   Vital Signs: BP 116/62 (BP Location: Left Arm)   Pulse 87   Temp 98.4 F (36.9 C) (Oral)   Resp 16   Ht 5\' 5"  (1.651 m)   Wt 240 lb (108.9 kg)   SpO2 98%   BMI 39.94 kg/m   Physical Exam Vitals reviewed.  Constitutional:      General: She is not in acute distress.    Appearance: Normal appearance. She is not ill-appearing.  HENT:     Head: Normocephalic and atraumatic.     Mouth/Throat:     Mouth: Mucous membranes are moist.  Cardiovascular:     Rate and Rhythm: Normal rate and regular rhythm.     Pulses: Normal pulses.     Heart sounds: Normal heart sounds.  Pulmonary:     Effort: Pulmonary effort is normal.     Breath sounds: Normal breath sounds.  Abdominal:     General: Abdomen is flat.     Palpations: Abdomen is soft.  Musculoskeletal:     Cervical back: Neck supple.  Skin:    General: Skin is warm.     Findings: Erythema present.     Comments: Erythematous and indurated rash lateral/posterior proximal left thigh. Non odorous, serous drainage noted. Skin is hard and warm to touch. No TTP.   Neurological:     Mental Status: She is alert and oriented to person, place, and time.  Psychiatric:        Mood and Affect: Mood normal.        Behavior: Behavior normal.  Judgment: Judgment normal.    MD Evaluation Airway: WNL Heart: WNL Abdomen: WNL Chest/ Lungs: WNL ASA  Classification: 2 Mallampati/Airway Score: Two  Imaging: Pelvis Portable  Result Date: 02/05/2021 CLINICAL DATA:  Left hip arthroplasty. EXAM: PORTABLE PELVIS 1-2 VIEWS COMPARISON:  Radiograph  02/03/2021 FINDINGS: Left hip arthroplasty in expected alignment. No periprosthetic lucency. Recent postsurgical change includes air and edema in the soft tissues with lateral skin staples. Postsurgical change of the right proximal femur, intact hardware where visualized. Bones diffusely under mineralized. IMPRESSION: Left hip arthroplasty without immediate postoperative complication. Electronically Signed   By: Keith Rake M.D.   On: 02/05/2021 20:20   CT Hip Left Wo Contrast  Result Date: 03/04/2021 CLINICAL DATA:  Left hip redness, soft tissue infection. History of left hip arthroplasty on 02/05/2021 EXAM: CT OF THE LEFT HIP WITHOUT CONTRAST TECHNIQUE: Multidetector CT imaging of the left hip was performed according to the standard protocol. Multiplanar CT image reconstructions were also generated. COMPARISON:  02/05/2021, 02/03/2021 x-ray FINDINGS: Bones/Joint/Cartilage Postsurgical changes from left hip hemiarthroplasty. No evidence of periprosthetic fracture or lucency. Ill-defined mineralization within the soft tissues adjacent to the greater and lesser trochanters, which may reflect developing heterotopic ossification. No appreciable periprosthetic fluid collection. Ligaments Suboptimally assessed by CT. Muscles and Tendons Mild atrophy of the proximal thigh musculature. No acute musculotendinous abnormality by CT. Soft tissues Lobulated fluid and air containing collection within the subcutaneous soft tissues overlying the anterolateral aspect of the left hip and proximal thigh measuring approximately 11.2 x 5.0 x 8.3 cm. Collection abuts the does not extend deep to the superficial investing fascia of the proximal thigh musculature. There is surrounding subcutaneous edema. Overlying skin thickening. No gas within the surrounding soft tissues outside of the collection. Mildly prominent left inguinal lymph nodes, likely reactive. IMPRESSION: 1. Lobulated fluid and air containing collection within the  subcutaneous soft tissues overlying the anterolateral aspect of the left hip and proximal thigh measuring approximately 11 x 5 x 8 cm, concerning for abscess versus infected postoperative seroma or hematoma. 2. Postsurgical changes from left hip hemiarthroplasty without evidence of periprosthetic fracture or loosening. No periprosthetic fluid collection. Previously described fluid collection does not appear to communicate with the left hip joint space. 3. Mildly prominent left inguinal lymph nodes, likely reactive. Electronically Signed   By: Davina Poke D.O.   On: 03/04/2021 13:10   DG C-Arm 1-60 Min-No Report  Result Date: 02/05/2021 CLINICAL DATA:  Left hemiarthroplasty. EXAM: OPERATIVE LEFT HIP (WITH PELVIS IF PERFORMED) TECHNIQUE: Fluoroscopic spot image(s) were submitted for interpretation post-operatively. COMPARISON:  Radiograph 02/03/2021 FINDINGS: Two fluoroscopic spot views obtained in the operating room of the pelvis and left hip. Left hip arthroplasty in expected alignment. Fluoroscopy time 6 seconds. Dose 1.2470 mGy. IMPRESSION: Procedural fluoroscopy for left hip arthroplasty. Electronically Signed   By: Keith Rake M.D.   On: 02/05/2021 18:35   DG HIP OPERATIVE UNILAT W OR W/O PELVIS LEFT  Result Date: 02/05/2021 CLINICAL DATA:  Left hemiarthroplasty. EXAM: OPERATIVE LEFT HIP (WITH PELVIS IF PERFORMED) TECHNIQUE: Fluoroscopic spot image(s) were submitted for interpretation post-operatively. COMPARISON:  Radiograph 02/03/2021 FINDINGS: Two fluoroscopic spot views obtained in the operating room of the pelvis and left hip. Left hip arthroplasty in expected alignment. Fluoroscopy time 6 seconds. Dose 1.2470 mGy. IMPRESSION: Procedural fluoroscopy for left hip arthroplasty. Electronically Signed   By: Keith Rake M.D.   On: 02/05/2021 18:35    Labs:  CBC: Recent Labs    02/07/21 3818 03/04/21 1218 03/05/21  0326 03/06/21 0150  WBC 12.7* 9.9 13.1* 13.2*  HGB 11.6* 11.5*  12.8 11.9*  HCT 35.9* 36.8 41.3 37.7  PLT 245 503* 503* 483*    COAGS: Recent Labs    05/08/20 1644 02/03/21 1635  INR 1.0 1.0    BMP: Recent Labs    05/08/20 1644 05/09/20 0321 05/10/20 0341 05/11/20 0429 02/03/21 1635 02/07/21 0623 03/04/21 1218 03/05/21 0755 03/06/21 0150  NA 139 140 141 138   < > 136 137 138 137  K 4.1 3.9 4.0 4.5   < > 3.4* 3.9 4.2 4.0  CL 96* 98 99 103   < > 100 97* 100 101  CO2 30 31 32 26   < > 33* 33* 27 29  GLUCOSE 144* 151* 147* 129*   < > 177* 164* 126* 150*  BUN 15 15 14 12    < > 17 9 7* 5*  CALCIUM 9.2 9.5 9.0 8.5*   < > 9.9 9.2 9.5 9.4  CREATININE 0.81 0.90 0.98 0.79   < > 0.77 0.67 0.64 0.78  GFRNONAA >60 59* 53* >60   < > >60 >60 >60 >60  GFRAA >60 >60 >60 >60  --   --   --   --   --    < > = values in this interval not displayed.    LIVER FUNCTION TESTS: Recent Labs    05/09/20 0321 02/05/21 0524 03/04/21 1218  BILITOT 2.0* 0.5 0.5  AST 56* 24 22  ALT 54* 18 13  ALKPHOS 155* 102 138*  PROT 5.9* 5.3* 5.2*  ALBUMIN 2.9* 2.9* 2.4*    TUMOR MARKERS: No results for input(s): AFPTM, CEA, CA199, CHROMGRNA in the last 8760 hours.  Assessment and Plan: 86 y.o. female with recent left total hip replacement on 02/05/21, currently admitted due to cellulitis and subcutaneous fluid collection over left hip.   Patient presented to ED with left hip pain and redness over the left hip skin, was found to have a subcutaneous fluid collection overlying the left hip.  Patient was afebrile and had no leukocytosis at the time of presentation to ED. Patient was started on antibiotic and was admitted, and developed leukocytosis despite being treated with antibiotic. Remains afebrile.   IR was requested for a image guided aspiration and possible drain placement of the left sq fluid collection over left hip.  Case was reviewed and and approved for CT-guided aspiration and possible drain placement per Dr. Kathlene Cote.  N.p.o. midnight 03/07/2021 Lovenox  and ASA held Vital stable, afebrile CBC with leukocytopenia 13.2 and mild anemia 11.9,  platelet 483 INR 1.0 on 02/03/21, no repeat INR needed per Dr. Kathlene Cote.   Risks and benefits discussed with the patient including bleeding, infection, damage to adjacent structures, bowel perforation/fistula connection, and sepsis.  All of the patient's questions were answered, patient is agreeable to proceed. Consent signed and in chart.   Thank you for this interesting consult.  I greatly enjoyed meeting Isabellamarie L Heskett and look forward to participating in their care.  A copy of this report was sent to the requesting provider on this date.  Electronically Signed: Tera Mater, PA-C 03/06/2021, 2:56 PM   I spent a total of 30 minutes    in face to face in clinical consultation, greater than 50% of which was counseling/coordinating care for aspiration and drain placement of a subcutaneous fluid collection overlying left hip.

## 2021-03-06 NOTE — TOC Progression Note (Signed)
Transition of Care [facility]) - Progression Note    Patient Details  Name: Melinda Kerr MRN: [id number] Date of Birth: 1934-11-22  Transition of Care [facility]) CM/SW Contact  Milinda Antis, [place] Phone Number: 03/06/2021, 1:36 PM  Clinical Narrative:    CSW spoke with Madison at [facility] in [place] [phone] and confirmed that the patient is a long term resident at the facility.  CSW informed that the facility would only need the discharge summary sent to them when the patient is discharged.         Expected Discharge Plan and Services                                                 Social Determinants of Health (SDOH) Interventions    Readmission Risk Interventions Readmission Risk Prevention Plan 02/06/2021 05/10/2020  Transportation Screening Complete Complete  PCP or Specialist Appt within 5-7 Days Complete Complete  Home Care Screening Complete Complete  Medication Review (RN CM) Complete Complete  Some recent data might be hidden

## 2021-03-06 NOTE — Plan of Care (Signed)

## 2021-03-07 ENCOUNTER — Inpatient Hospital Stay (HOSPITAL_COMMUNITY): Payer: Medicare Other

## 2021-03-07 DIAGNOSIS — L02416 Cutaneous abscess of left lower limb: Secondary | ICD-10-CM | POA: Diagnosis not present

## 2021-03-07 DIAGNOSIS — E038 Other specified hypothyroidism: Secondary | ICD-10-CM

## 2021-03-07 DIAGNOSIS — I10 Essential (primary) hypertension: Secondary | ICD-10-CM | POA: Diagnosis not present

## 2021-03-07 DIAGNOSIS — I5032 Chronic diastolic (congestive) heart failure: Secondary | ICD-10-CM

## 2021-03-07 LAB — CBC
HCT: 35.1 % — ABNORMAL LOW (ref 36.0–46.0)
Hemoglobin: 11.1 g/dL — ABNORMAL LOW (ref 12.0–15.0)
MCH: 30.9 pg (ref 26.0–34.0)
MCHC: 31.6 g/dL (ref 30.0–36.0)
MCV: 97.8 fL (ref 80.0–100.0)
Platelets: 462 10*3/uL — ABNORMAL HIGH (ref 150–400)
RBC: 3.59 MIL/uL — ABNORMAL LOW (ref 3.87–5.11)
RDW: 14.7 % (ref 11.5–15.5)
WBC: 13.8 10*3/uL — ABNORMAL HIGH (ref 4.0–10.5)
nRBC: 0 % (ref 0.0–0.2)

## 2021-03-07 LAB — BASIC METABOLIC PANEL
Anion gap: 8 (ref 5–15)
BUN: 8 mg/dL (ref 8–23)
CO2: 27 mmol/L (ref 22–32)
Calcium: 9 mg/dL (ref 8.9–10.3)
Chloride: 101 mmol/L (ref 98–111)
Creatinine, Ser: 0.93 mg/dL (ref 0.44–1.00)
GFR, Estimated: >60 mL/min (ref >60)
Glucose, Bld: 145 mg/dL — ABNORMAL HIGH (ref 70–99)
Potassium: 3.7 mmol/L (ref 3.5–5.1)
Sodium: 136 mmol/L (ref 135–145)

## 2021-03-07 MED ORDER — MIDAZOLAM HCL 2 MG/2ML IJ SOLN
INTRAMUSCULAR | Status: AC
  Filled 2021-03-07: qty 2

## 2021-03-07 MED ORDER — SODIUM CHLORIDE 0.9 % IV SOLN
INTRAVENOUS | Status: AC | PRN
  Administered 2021-03-07: 250 mL via INTRAVENOUS

## 2021-03-07 MED ORDER — FENTANYL CITRATE (PF) 100 MCG/2ML IJ SOLN
INTRAMUSCULAR | Status: AC | PRN
  Administered 2021-03-07 (×2): 25 ug via INTRAVENOUS

## 2021-03-07 MED ORDER — FENTANYL CITRATE (PF) 100 MCG/2ML IJ SOLN
INTRAMUSCULAR | Status: AC
  Filled 2021-03-07: qty 2

## 2021-03-07 MED ORDER — LIDOCAINE-EPINEPHRINE 1 %-1:100000 IJ SOLN
INTRAMUSCULAR | Status: AC | PRN
  Administered 2021-03-07: 10 mL

## 2021-03-07 MED ORDER — SODIUM CHLORIDE 0.9% FLUSH: 5.0000 mL | Freq: Three times a day (TID) | INTRAVENOUS | Status: DC

## 2021-03-07 MED ORDER — LIDOCAINE-EPINEPHRINE 1 %-1:100000 IJ SOLN
INTRAMUSCULAR | Status: AC
  Filled 2021-03-07: qty 1

## 2021-03-07 MED ORDER — ENOXAPARIN SODIUM 40 MG/0.4ML IJ SOSY: 40.0000 mg | PREFILLED_SYRINGE | Freq: Every day | INTRAMUSCULAR | Status: DC

## 2021-03-07 MED ORDER — MIDAZOLAM HCL 2 MG/2ML IJ SOLN
INTRAMUSCULAR | Status: AC | PRN
  Administered 2021-03-07 (×2): 0.5 mg via INTRAVENOUS

## 2021-03-07 NOTE — Progress Notes (Signed)
PROGRESS NOTE    ELFRIEDE BONINI   JSH:[id number]  DOB: 1935-03-10  DOA: 03/04/2021 PCP: Maury Dus, MD   Brief Narrative:  Melinda Kerr is an 86 year old female with a history of hypothyroidism, essential hypertension and dyslipidemia who presents to the hospital for left hip pain which has increased since her hemiarthroplasty on 02/05/2021.  She is taken 2 courses of antibiotics for cellulitis as an outpatient.  She is admitted to the hospital for IV antibiotics   Subjective: Complains of having pain in her left groin.    Assessment & Plan:   Principal Problem: Cellulitis and abscess of hip, left-postop -WBC 13.8 -Abscess area appears to be draining a little bit today -170 cc of purulent fluid drained today for IR placed a CT-guided 10 French catheter -Follow-up on culture-continue Vanco and Zosyn -Continue oxycodone for pain control  Active Problems: Gout - Continue allopurinol    Hypothyroidism -Continue Synthroid    Essential hypertension   Chronic diastolic CHF (congestive heart failure) (HCC) -Hold Lasix and Aldactone for now as creatinine is rising-follow creatinine  Normocytic anemia - Hemoglobin ranges from 11-12  Time spent in minutes: 35 DVT prophylaxis: enoxaparin (LOVENOX) injection 40 mg Start: 03/08/21 1000 SCDs Start: 03/04/21 1629 Code Status: DO NOT RESUSCITATE Family Communication:  Level of Care: Level of care: Med-Surg Disposition Plan:  Status is: Inpatient  Remains inpatient appropriate because:IV treatments appropriate due to intensity of illness or inability to take PO  Dispo: The patient is from: SNF              Anticipated d/c is to: SNF              Patient currently is not medically stable to d/c.   Difficult to place patient No      Consultants:  Orthopedic surgery Procedures:  IR -CT-guided drain placement Antimicrobials:  Anti-infectives (From admission, onward)    Start     Dose/Rate Route Frequency Ordered Stop    03/05/21 1500  vancomycin (VANCOREADY) IVPB 1000 mg/200 mL        1,000 mg 200 mL/hr over 60 Minutes Intravenous Every 24 hours 03/04/21 1505     03/04/21 1600  piperacillin-tazobactam (ZOSYN) IVPB 3.375 g        3.375 g 12.5 mL/hr over 240 Minutes Intravenous Every 8 hours 03/04/21 1546     03/04/21 1445  vancomycin (VANCOCIN) 2,250 mg in sodium chloride 0.9 % 500 mL IVPB        2,250 mg 250 mL/hr over 120 Minutes Intravenous  Once 03/04/21 1437 03/04/21 1741        Objective: Vitals:   03/07/21 1140 03/07/21 1145 03/07/21 1221 03/07/21 1501  BP: 114/66 131/69 116/60 (!) 131/105  Pulse: 66 65 66 70  Resp: 16 19 16 17   Temp:   99 F (37.2 C) 97.8 F (36.6 C)  TempSrc:   Oral Oral  SpO2: 99% 95% 100% 95%  Weight:      Height:        Intake/Output Summary (Last 24 hours) at 03/07/2021 1617 Last data filed at 03/07/2021 1553 Gross per 24 hour  Intake 996.46 ml  Output 770 ml  Net 226.46 ml   Filed Weights   03/04/21 1430 03/05/21 0103  Weight: 110.1 kg 108.9 kg    Examination: General exam: Appears comfortable  HEENT: PERRLA, oral mucosa moist, no sclera icterus or thrush Respiratory system: Clear to auscultation. Respiratory effort normal. Cardiovascular system: S1 & S2 heard, RRR.  Gastrointestinal system: Abdomen soft, non-tender, nondistended. Normal bowel sounds. Central nervous system: Alert and oriented. No focal neurological deficits. Extremities: No cyanosis, clubbing -extensively erythematous indurated and tender area in left groin and upper thigh Skin: No rashes or ulcers Psychiatry:  Mood & affect appropriate.     Data Reviewed: I have personally reviewed following labs and imaging studies  CBC: Recent Labs  Lab 03/04/21 1218 03/05/21 0326 03/06/21 0150 03/07/21 0438  WBC 9.9 13.1* 13.2* 13.8*  NEUTROABS 7.6  --   --   --   HGB 11.5* 12.8 11.9* 11.1*  HCT 36.8 41.3 37.7 35.1*  MCV 100.0 100.0 97.7 97.8  PLT 503* 503* 483* 462*   Basic  Metabolic Panel: Recent Labs  Lab 03/04/21 1218 03/05/21 0755 03/06/21 0150 03/07/21 0438  NA 137 138 137 136  K 3.9 4.2 4.0 3.7  CL 97* 100 101 101  CO2 33* 27 29 27   GLUCOSE 164* 126* 150* 145*  BUN 9 7* 5* 8  CREATININE 0.67 0.64 0.78 0.93  CALCIUM 9.2 9.5 9.4 9.0   GFR: Estimated Creatinine Clearance: 55.3 mL/min (by C-G formula based on SCr of 0.93 mg/dL). Liver Function Tests: Recent Labs  Lab 03/04/21 1218  AST 22  ALT 13  ALKPHOS 138*  BILITOT 0.5  PROT 5.2*  ALBUMIN 2.4*   No results for input(s): LIPASE, AMYLASE in the last 168 hours. No results for input(s): AMMONIA in the last 168 hours. Coagulation Profile: No results for input(s): INR, PROTIME in the last 168 hours. Cardiac Enzymes: No results for input(s): CKTOTAL, CKMB, CKMBINDEX, TROPONINI in the last 168 hours. BNP (last 3 results) No results for input(s): PROBNP in the last 8760 hours. HbA1C: No results for input(s): HGBA1C in the last 72 hours. CBG: No results for input(s): GLUCAP in the last 168 hours. Lipid Profile: No results for input(s): CHOL, HDL, LDLCALC, TRIG, CHOLHDL, LDLDIRECT in the last 72 hours. Thyroid Function Tests: No results for input(s): TSH, T4TOTAL, FREET4, T3FREE, THYROIDAB in the last 72 hours. Anemia Panel: No results for input(s): VITAMINB12, FOLATE, FERRITIN, TIBC, IRON, RETICCTPCT in the last 72 hours. Urine analysis:    Component Value Date/Time   COLORURINE COLORLESS (A) 05/14/2019 1331   APPEARANCEUR CLEAR 05/14/2019 1331   LABSPEC 1.018 05/14/2019 1331   LABSPEC 1.020 05/04/2016 1006   PHURINE 5.0 05/14/2019 1331   GLUCOSEU NEGATIVE 05/14/2019 1331   GLUCOSEU Negative 05/04/2016 1006   HGBUR NEGATIVE 05/14/2019 1331   BILIRUBINUR NEGATIVE 05/14/2019 1331   BILIRUBINUR Negative 05/04/2016 1006   KETONESUR NEGATIVE 05/14/2019 1331   PROTEINUR NEGATIVE 05/14/2019 1331   UROBILINOGEN 0.2 05/04/2016 1006   NITRITE NEGATIVE 05/14/2019 1331   LEUKOCYTESUR  NEGATIVE 05/14/2019 1331   LEUKOCYTESUR Small 05/04/2016 1006   Sepsis Labs: @LABRCNTIP (procalcitonin:4,lacticidven:4) ) Recent Results (from the past 240 hour(s))  Blood culture (routine x 2)     Status: None (Preliminary result)   Collection Time: 03/04/21 12:07 PM   Specimen: BLOOD  Result Value Ref Range Status   Specimen Description BLOOD LEFT ANTECUBITAL  Final   Special Requests   Final    BOTTLES DRAWN AEROBIC AND ANAEROBIC Blood Culture adequate volume   Culture   Final    NO GROWTH 3 DAYS Performed at [facility], [place] [address] [place], [place] [address]    Report Status PENDING  Incomplete  Blood culture (routine x 2)     Status: None (Preliminary result)   Collection Time: 03/04/21  4:09 PM  Specimen: BLOOD  Result Value Ref Range Status   Specimen Description BLOOD SITE NOT SPECIFIED  Final   Special Requests   Final    BOTTLES DRAWN AEROBIC AND ANAEROBIC Blood Culture results may not be optimal due to an inadequate volume of blood received in culture bottles   Culture   Final    NO GROWTH 3 DAYS Performed at [facility], [place] [address] [place], [place] [address]    Report Status PENDING  Incomplete  Resp Panel by RT-PCR (Flu A&B, Covid) Nasopharyngeal Swab     Status: None   Collection Time: 03/04/21  5:24 PM   Specimen: Nasopharyngeal Swab; Nasopharyngeal(NP) swabs in vial transport medium  Result Value Ref Range Status   SARS Coronavirus 2 by RT PCR NEGATIVE NEGATIVE Final    Comment: (NOTE) SARS-CoV-2 target nucleic acids are NOT DETECTED.  The SARS-CoV-2 RNA is generally detectable in upper respiratory specimens during the acute phase of infection. The lowest concentration of SARS-CoV-2 viral copies this assay can detect is 138 copies/mL. A negative result does not preclude SARS-Cov-2 infection and should not be used as the sole basis for treatment or other patient management decisions. A negative result may occur with  improper  specimen collection/handling, submission of specimen other than nasopharyngeal swab, presence of viral mutation(s) within the areas targeted by this assay, and inadequate number of viral copies(<138 copies/mL). A negative result must be combined with clinical observations, patient history, and epidemiological information. The expected result is Negative.  Fact Sheet for Patients:  EntrepreneurPulse.com.au  Fact Sheet for Healthcare Providers:  IncredibleEmployment.be  This test is no t yet approved or cleared by the [place] FDA and  has been authorized for detection and/or diagnosis of SARS-CoV-2 by FDA under an Emergency Use Authorization (EUA). This EUA will remain  in effect (meaning this test can be used) for the duration of the COVID-19 declaration under Section 564(b)(1) of the Act, 21 U.S.C.section 360bbb-3(b)(1), unless the authorization is terminated  or revoked sooner.       Influenza A by PCR NEGATIVE NEGATIVE Final   Influenza B by PCR NEGATIVE NEGATIVE Final    Comment: (NOTE) The Xpert Xpress SARS-CoV-2/FLU/RSV plus assay is intended as an aid in the diagnosis of influenza from Nasopharyngeal swab specimens and should not be used as a sole basis for treatment. Nasal washings and aspirates are unacceptable for Xpert Xpress SARS-CoV-2/FLU/RSV testing.  Fact Sheet for Patients: EntrepreneurPulse.com.au  Fact Sheet for Healthcare Providers: IncredibleEmployment.be  This test is not yet approved or cleared by the [place] FDA and has been authorized for detection and/or diagnosis of SARS-CoV-2 by FDA under an Emergency Use Authorization (EUA). This EUA will remain in effect (meaning this test can be used) for the duration of the COVID-19 declaration under Section 564(b)(1) of the Act, 21 U.S.C. section 360bbb-3(b)(1), unless the authorization is terminated or revoked.  Performed at  [facility], [place] [address] [place], [place] [address]          Radiology Studies: CT IMAGE GUIDED FLUID DRAIN BY CATHETER  Result Date: 03/07/2021 INDICATION: Recent left total hip replacement, now with indeterminate fluid collection about the anterolateral aspect of the left thigh worrisome for abscess. Please perform percutaneous aspiration and/or drainage catheter placement. EXAM: ULTRASOUND AND CT IMAGE GUIDED FLUID DRAIN BY CATHETER COMPARISON:  Left hip CT-03/04/2021 MEDICATIONS: The patient is currently admitted to the hospital and receiving intravenous antibiotics. The antibiotics were administered within an appropriate time frame  prior to the initiation of the procedure. ANESTHESIA/SEDATION: Moderate (conscious) sedation was employed during this procedure. A total of Versed 1 mg and Fentanyl 50 mcg was administered intravenously. Moderate Sedation Time: 22 minutes. The patient's level of consciousness and vital signs were monitored continuously by radiology nursing throughout the procedure under my direct supervision. CONTRAST:  None COMPLICATIONS: None immediate. PROCEDURE: Informed written consent was obtained from the patient after a discussion of the risks, benefits and alternatives to treatment. The patient was placed supine on the CT gantry and a pre procedural CT was performed re-demonstrating the known abscess/fluid collection within the subcutaneous tissues of the anterolateral aspect of the left thigh with dominant air in fluid containing component measuring at least 8.2 x 5.0 cm (image 23, series 2). The CT gantry table position was marked and the collection was identified sonographically. The procedure was planned. A timeout was performed prior to the initiation of the procedure. The skin overlying the lateral aspect the left thigh was prepped and draped in the usual sterile fashion. The overlying soft tissues were anesthetized with 1% lidocaine with epinephrine. Under  direct ultrasound guidance, the inferior aspect the complex fluid collection was accessed with an 18 gauge trocar needle allowing coiling of a short Amplatz wire within the collection. Multiple ultrasound images were saved procedural documentation purposes. Appropriate positioning was confirmed with a limited CT scan. The tract was serially dilated allowing placement of a 10 [place] all-purpose drainage catheter. Appropriate positioning was confirmed with a limited postprocedural CT scan. Approximately 170 ml of purulent fluid was aspirated. The tube was connected to a JP bulb and sutured in place. A dressing was placed. The patient tolerated the procedure well without immediate post procedural complication. IMPRESSION: Successful ultrasound and CT guided placement of a 10 French all purpose drain catheter into the abscess within the subcutaneous tissues about the anterolateral aspect the left thigh with aspiration of 170 mL of purulent fluid. Samples were sent to the laboratory as requested by the ordering clinical team. Electronically Signed   By: Sandi Mariscal M.D.   On: 03/07/2021 12:42      Scheduled Meds:  allopurinol  300 mg Oral Daily   aspirin EC  81 mg Oral BID WC   citalopram  10 mg Oral Daily   docusate sodium  100 mg Oral BID   [START ON 03/08/2021] enoxaparin (LOVENOX) injection  40 mg Subcutaneous Daily   furosemide  20 mg Oral Daily   levothyroxine  125 mcg Oral QAC breakfast   metoCLOPramide  5 mg Oral BID AC & HS   oxyCODONE  5 mg Oral Q4H while awake   pantoprazole  40 mg Oral BID AC   sodium chloride flush  5 mL Intracatheter Q8H   spironolactone  25 mg Oral Daily   Continuous Infusions:  sodium chloride 250 mL (03/05/21 1457)   piperacillin-tazobactam (ZOSYN)  IV 3.375 g (03/07/21 1421)   vancomycin 1,000 mg (03/07/21 1614)     LOS: 3 days      Debbe Odea, MD [facility] Pager: [email or web address] 03/07/2021, 4:17 PM

## 2021-03-07 NOTE — Plan of Care (Signed)
70 ml of output on JP drain    Problem: Education: Goal: Knowledge of General Education information will improve Description: Including pain rating scale, medication(s)/side effects and non-pharmacologic comfort measures Outcome: Progressing   Problem: Activity: Goal: Risk for activity intolerance will decrease Outcome: Progressing   Problem: Pain Managment: Goal: General experience of comfort will improve Outcome: Progressing   Problem: Safety: Goal: Ability to remain free from injury will improve Outcome: Progressing   Problem: Skin Integrity: Goal: Risk for impaired skin integrity will decrease Outcome: Progressing

## 2021-03-07 NOTE — Progress Notes (Signed)
Patient refused CPAP HS tonight. Patient stated she doesn't wear CPAP at home.

## 2021-03-07 NOTE — Evaluation (Signed)
Physical Therapy Evaluation Patient Details Name: Melinda Kerr MRN: [id number] DOB: 1935/08/01 Today's Date: 03/07/2021   History of Present Illness  86 y.o. female [facility] ED on 03/04/2021 with L hip pain and redness after recent L hip hemiarthroplasty on 02/05/2021. Pt underwent US and CT guided catheter placement to drain left anterior-lateral thigh abscess on 03/07/2021. PMH includes chronic diastolic congestive heart failure, essential hypertension, CAD, history of CVA, OSA on CPAP, HLD, endometrial cancer, Hx DVT.  Clinical Impression  Pt presents with significant deficits in functional mobility, gait, balance, strength, power, and is limited by pain. Pt requires significant physical assistance for all bed mobility, and is unable to tolerate even a brief period of sitting upright with back support due to reports of back pain. Pt reports recently working on standing with 2 person assistance in parallel bars at [employer], but also reports other than working with therapy she does not leave the bed and requires assistance for all mobility. Pt will benefit from acute PT services to aide in progressing toward goals and reducing caregiver burden with transfers.    Follow Up Recommendations SNF    Equipment Recommendations  None recommended by PT    Recommendations for Other Services       Precautions / Restrictions Precautions Precautions: Fall Precaution Comments: L hip drain Restrictions Weight Bearing Restrictions: No      Mobility  Bed Mobility Overal bed mobility: Needs Assistance Bed Mobility: Supine to Sit;Sit to Supine     Supine to sit: Total assist;+2 for physical assistance;HOB elevated Sit to supine: Total assist;+2 for physical assistance   General bed mobility comments: helicopter technique    Transfers Overall transfer level:  (pt unable to tolerate sitting at edge of bed)                  Ambulation/Gait                Stairs             Wheelchair Mobility    Modified Rankin (Stroke Patients Only)       Balance                                             Pertinent Vitals/Pain Pain Assessment: 0-10 Pain Score: 5  Pain Location: L hip Pain Descriptors / Indicators: Aching Pain Intervention(s): Monitored during session    Home Living Family/patient expects to be discharged to:: Skilled nursing facility                 Additional Comments: Pt from [employer] with plan to return    Prior Function Level of Independence: Needs assistance   Gait / Transfers Assistance Needed: pt reports being largely bed bound with use of lift for transfers and physical assistance for all functional mobility. Pt also reports working with therapies on standing, and working on ambulating in parallel bars before recent hip fracture, needing 2 person assistance for all mobility.  ADL's / Homemaking Assistance Needed: Required assist for bathing, dressing and toileting, could self feed and groom        Hand Dominance   Dominant Hand: Right    Extremity/Trunk Assessment   Upper Extremity Assessment Upper Extremity Assessment: RUE deficits/detail;LUE deficits/detail RUE Deficits / Details: shoulder flexion limited to 90 degrees actively, 4-/5 grossly LUE Deficits / Details: 4-/5 gross strength, AROM  WFL    Lower Extremity Assessment Lower Extremity Assessment: Generalized weakness (grossly 2-/5 hip and knee strength, 3/5 PF/DF)    Cervical / Trunk Assessment Cervical / Trunk Assessment: Other exceptions;Kyphotic Cervical / Trunk Exceptions: morbid obesity  Communication   Communication: No difficulties  Cognition Arousal/Alertness: Awake/alert Behavior During Therapy: WFL for tasks assessed/performed Overall Cognitive Status: No family/caregiver present to determine baseline cognitive functioning                                 General Comments: pt follows commands well and responds  to all questions appropriately      General Comments General comments (skin integrity, edema, etc.): VSS on RA    Exercises     Assessment/Plan    PT Assessment Patient needs continued PT services  PT Problem List Decreased strength;Decreased activity tolerance;Pain;Decreased balance;Decreased mobility       PT Treatment Interventions DME instruction;Functional mobility training;Therapeutic activities;Therapeutic exercise;Balance training;Patient/family education    PT Goals (Current goals can be found in the Care Plan section)  Acute Rehab PT Goals Patient Stated Goal: to reduce pain and improve functional mobility quality PT Goal Formulation: With patient Time For Goal Achievement: 03/21/21 Potential to Achieve Goals: Poor    Frequency Min 2X/week   Barriers to discharge        Co-evaluation               AM-PAC PT "6 Clicks" Mobility  Outcome Measure Help needed turning from your back to your side while in a flat bed without using bedrails?: Total Help needed moving from lying on your back to sitting on the side of a flat bed without using bedrails?: Total Help needed moving to and from a bed to a chair (including a wheelchair)?: Total Help needed standing up from a chair using your arms (e.g., wheelchair or bedside chair)?: Total Help needed to walk in hospital room?: Total Help needed climbing 3-5 steps with a railing? : Total 6 Click Score: 6    End of Session   Activity Tolerance: Patient limited by pain Patient left: in bed;with call bell/phone within reach;with bed alarm set Nurse Communication: Mobility status;Need for lift equipment PT Visit Diagnosis: Other abnormalities of gait and mobility (R26.89);Pain Pain - Right/Left: Left Pain - part of body: Hip    Time: 7654-6503 PT Time Calculation (min) (ACUTE ONLY): 15 min   Charges:   PT Evaluation $PT Eval Low Complexity: 1 Low          [name], PT, DPT Acute Rehabilitation Pager:  [phone]   [name] 03/07/2021, 2:55 PM

## 2021-03-07 NOTE — Procedures (Signed)
Pre procedural Dx: Left thigh fluid collection/abscess Post procedural Dx: Same  Successful [place] and CT guided placed of a 10 Fr drainage catheter placement into the left anterior-lateral thigh abscess yielding 170 cc of purulent fluid.   A representative aspirated sample was capped and sent to the laboratory for analysis.    EBL: Trace Complications: None immediate  Ronny Bacon, MD Pager #: [phone]

## 2021-03-08 ENCOUNTER — Encounter (HOSPITAL_COMMUNITY)
Admission: EM | Disposition: A | Discharge status: Skilled Nursing Facility | Payer: Self-pay | Source: Skilled Nursing Facility | Attending: Internal Medicine

## 2021-03-08 ENCOUNTER — Inpatient Hospital Stay (HOSPITAL_COMMUNITY): Payer: Medicare Other

## 2021-03-08 ENCOUNTER — Encounter (HOSPITAL_COMMUNITY): Payer: Self-pay | Admitting: Internal Medicine

## 2021-03-08 ENCOUNTER — Inpatient Hospital Stay (HOSPITAL_COMMUNITY): Payer: Medicare Other | Admitting: Certified Registered Nurse Anesthetist

## 2021-03-08 DIAGNOSIS — L02416 Cutaneous abscess of left lower limb: Secondary | ICD-10-CM | POA: Diagnosis not present

## 2021-03-08 DIAGNOSIS — L03114 Cellulitis of left upper limb: Secondary | ICD-10-CM | POA: Diagnosis not present

## 2021-03-08 DIAGNOSIS — I5032 Chronic diastolic (congestive) heart failure: Secondary | ICD-10-CM | POA: Diagnosis not present

## 2021-03-08 DIAGNOSIS — F32A Depression, unspecified: Secondary | ICD-10-CM

## 2021-03-08 DIAGNOSIS — S72002A Fracture of unspecified part of neck of left femur, initial encounter for closed fracture: Secondary | ICD-10-CM

## 2021-03-08 HISTORY — PX: I & D EXTREMITY: SHX5045

## 2021-03-08 HISTORY — PX: APPLICATION OF WOUND VAC: SHX5189

## 2021-03-08 LAB — SURGICAL PCR SCREEN
MRSA, PCR: NEGATIVE
Staphylococcus aureus: NEGATIVE

## 2021-03-08 SURGERY — IRRIGATION AND DEBRIDEMENT EXTREMITY
Anesthesia: General | Site: Hip | Laterality: Left

## 2021-03-08 MED ORDER — DEXAMETHASONE SODIUM PHOSPHATE 10 MG/ML IJ SOLN
INTRAMUSCULAR | Status: AC
  Filled 2021-03-08: qty 1

## 2021-03-08 MED ORDER — ROCURONIUM BROMIDE 10 MG/ML (PF) SYRINGE
PREFILLED_SYRINGE | INTRAVENOUS | Status: DC | PRN
  Administered 2021-03-08: 50 mg via INTRAVENOUS

## 2021-03-08 MED ORDER — SUGAMMADEX SODIUM 200 MG/2ML IV SOLN
INTRAVENOUS | Status: DC | PRN
  Administered 2021-03-08: 200 mg via INTRAVENOUS

## 2021-03-08 MED ORDER — POVIDONE-IODINE 10 % EX SWAB
2.0000 "application " | Freq: Once | CUTANEOUS | Status: AC
  Administered 2021-03-08: 2 via TOPICAL

## 2021-03-08 MED ORDER — LIDOCAINE 2% (20 MG/ML) 5 ML SYRINGE
INTRAMUSCULAR | Status: DC | PRN
  Administered 2021-03-08: 20 mg via INTRAVENOUS

## 2021-03-08 MED ORDER — ROCURONIUM BROMIDE 10 MG/ML (PF) SYRINGE
PREFILLED_SYRINGE | INTRAVENOUS | Status: AC
  Filled 2021-03-08: qty 10

## 2021-03-08 MED ORDER — ONDANSETRON HCL 4 MG PO TABS: 4.0000 mg | ORAL_TABLET | Freq: Four times a day (QID) | ORAL | Status: DC | PRN

## 2021-03-08 MED ORDER — HYDRALAZINE HCL 20 MG/ML IJ SOLN: 10.0000 mg | Freq: Four times a day (QID) | INTRAMUSCULAR | Status: DC | PRN

## 2021-03-08 MED ORDER — CHLORHEXIDINE GLUCONATE 4 % EX LIQD
60.0000 mL | Freq: Once | CUTANEOUS | Status: AC
  Administered 2021-03-08: 4 via TOPICAL

## 2021-03-08 MED ORDER — CHLORHEXIDINE GLUCONATE 0.12 % MT SOLN: 15.0000 mL | Freq: Once | OROMUCOSAL | Status: AC

## 2021-03-08 MED ORDER — LACTATED RINGERS IV SOLN: INTRAVENOUS | Status: DC

## 2021-03-08 MED ORDER — FENTANYL CITRATE (PF) 250 MCG/5ML IJ SOLN
INTRAMUSCULAR | Status: AC
  Filled 2021-03-08: qty 5

## 2021-03-08 MED ORDER — TOBRAMYCIN SULFATE 1.2 G IJ SOLR
INTRAMUSCULAR | Status: AC
  Filled 2021-03-08: qty 1.2

## 2021-03-08 MED ORDER — DEXAMETHASONE SODIUM PHOSPHATE 10 MG/ML IJ SOLN
INTRAMUSCULAR | Status: DC | PRN
  Administered 2021-03-08: 20 mg via INTRAVENOUS
  Administered 2021-03-08: 5 mg via INTRAVENOUS

## 2021-03-08 MED ORDER — PROPOFOL 10 MG/ML IV BOLUS
INTRAVENOUS | Status: DC | PRN
  Administered 2021-03-08: 70 mg via INTRAVENOUS

## 2021-03-08 MED ORDER — METOCLOPRAMIDE HCL 5 MG/ML IJ SOLN: 5.0000 mg | Freq: Three times a day (TID) | INTRAMUSCULAR | Status: DC | PRN

## 2021-03-08 MED ORDER — LIDOCAINE HCL (PF) 2 % IJ SOLN
INTRAMUSCULAR | Status: AC
  Filled 2021-03-08: qty 5

## 2021-03-08 MED ORDER — METOCLOPRAMIDE HCL 5 MG PO TABS: 5.0000 mg | ORAL_TABLET | Freq: Three times a day (TID) | ORAL | Status: DC | PRN

## 2021-03-08 MED ORDER — TOBRAMYCIN SULFATE 1.2 G IJ SOLR
INTRAMUSCULAR | Status: DC | PRN
  Administered 2021-03-08: 1.2 g via TOPICAL

## 2021-03-08 MED ORDER — CEFAZOLIN SODIUM-DEXTROSE 2-4 GM/100ML-% IV SOLN
2.0000 g | INTRAVENOUS | Status: AC
  Administered 2021-03-08: 2 g via INTRAVENOUS
  Filled 2021-03-08: qty 100

## 2021-03-08 MED ORDER — ONDANSETRON HCL 4 MG/2ML IJ SOLN: 4.0000 mg | Freq: Four times a day (QID) | INTRAMUSCULAR | Status: DC | PRN

## 2021-03-08 MED ORDER — FENTANYL CITRATE (PF) 250 MCG/5ML IJ SOLN
INTRAMUSCULAR | Status: DC | PRN
  Administered 2021-03-08 (×2): 50 ug via INTRAVENOUS

## 2021-03-08 MED ORDER — CHLORHEXIDINE GLUCONATE 0.12 % MT SOLN
OROMUCOSAL | Status: AC
  Administered 2021-03-08: 15 mL via OROMUCOSAL
  Filled 2021-03-08: qty 15

## 2021-03-08 MED ORDER — ONDANSETRON HCL 4 MG/2ML IJ SOLN
INTRAMUSCULAR | Status: DC | PRN
  Administered 2021-03-08: 4 mg via INTRAVENOUS

## 2021-03-08 MED ORDER — DOCUSATE SODIUM 100 MG PO CAPS
100.0000 mg | ORAL_CAPSULE | Freq: Two times a day (BID) | ORAL | Status: DC
  Administered 2021-03-08 – 2021-03-13 (×10): 100 mg via ORAL
  Filled 2021-03-08 (×10): qty 1

## 2021-03-08 MED ORDER — PROPOFOL 10 MG/ML IV BOLUS
INTRAVENOUS | Status: AC
  Filled 2021-03-08: qty 20

## 2021-03-08 MED ORDER — 0.9 % SODIUM CHLORIDE (POUR BTL) OPTIME
TOPICAL | Status: DC | PRN
  Administered 2021-03-08: 1000 mL

## 2021-03-08 MED ORDER — SENNA 8.6 MG PO TABS
1.0000 | ORAL_TABLET | Freq: Two times a day (BID) | ORAL | Status: DC
  Administered 2021-03-08 – 2021-03-13 (×10): 8.6 mg via ORAL
  Filled 2021-03-08 (×10): qty 1

## 2021-03-08 MED ORDER — FENTANYL CITRATE (PF) 100 MCG/2ML IJ SOLN: 25.0000 ug | INTRAMUSCULAR | Status: DC | PRN

## 2021-03-08 MED ORDER — MUPIROCIN 2 % EX OINT
1.0000 "application " | TOPICAL_OINTMENT | Freq: Two times a day (BID) | CUTANEOUS | Status: AC
  Administered 2021-03-09 – 2021-03-12 (×8): 1 via NASAL
  Filled 2021-03-08 (×2): qty 22

## 2021-03-08 MED ORDER — SODIUM CHLORIDE 0.9 % IR SOLN
Status: DC | PRN
  Administered 2021-03-08: 3000 mL

## 2021-03-08 SURGICAL SUPPLY — 36 items
ALCOHOL 70% 16 OZ (MISCELLANEOUS) ×1 IMPLANT
APL PRP STRL LF DISP 70% ISPRP (MISCELLANEOUS) ×2
CANISTER WOUND CARE 500ML ATS (WOUND CARE) ×1 IMPLANT
CHLORAPREP W/TINT 26 (MISCELLANEOUS) ×1 IMPLANT
COVER PERINEAL POST (MISCELLANEOUS) ×1 IMPLANT
COVER SURGICAL LIGHT HANDLE (MISCELLANEOUS) ×3 IMPLANT
DRAIN HEMOVAC 1/8 X 5 (WOUND CARE) ×1 IMPLANT
DRAPE STERI IOBAN 125X83 (DRAPES) ×1 IMPLANT
DRAPE U-SHAPE 47X51 STRL (DRAPES) ×2 IMPLANT
DRESSING PREVENA PLUS CUSTOM (GAUZE/BANDAGES/DRESSINGS) IMPLANT
DRSG PREVENA PLUS CUSTOM (GAUZE/BANDAGES/DRESSINGS) ×3
DRSG TEGADERM 4X4.75 (GAUZE/BANDAGES/DRESSINGS) ×1 IMPLANT
ELECT BLADE 4.0 EZ CLEAN MEGAD (MISCELLANEOUS) ×3
ELECTRODE BLDE 4.0 EZ CLN MEGD (MISCELLANEOUS) IMPLANT
EVACUATOR SILICONE 100CC (DRAIN) ×1 IMPLANT
GLOVE BIOGEL PI IND STRL 7.5 (GLOVE) ×4 IMPLANT
GLOVE BIOGEL PI INDICATOR 7.5 (GLOVE) ×1
GLOVE SURG PR MICRO ENCORE 7.5 (GLOVE) ×3 IMPLANT
GOWN STRL REUS W/ TWL LRG LVL3 (GOWN DISPOSABLE) ×2 IMPLANT
GOWN STRL REUS W/ TWL XL LVL3 (GOWN DISPOSABLE) ×6 IMPLANT
GOWN STRL REUS W/TWL LRG LVL3 (GOWN DISPOSABLE) ×3
GOWN STRL REUS W/TWL XL LVL3 (GOWN DISPOSABLE) ×3
HANDPIECE INTERPULSE COAX TIP (DISPOSABLE) ×3
JET LAVAGE IRRISEPT WOUND (IRRIGATION / IRRIGATOR) ×3
KIT BASIN OR (CUSTOM PROCEDURE TRAY) ×3 IMPLANT
LAVAGE JET IRRISEPT WOUND (IRRIGATION / IRRIGATOR) IMPLANT
MANIFOLD NEPTUNE II (INSTRUMENTS) ×3 IMPLANT
PACK ORTHO EXTREMITY (CUSTOM PROCEDURE TRAY) ×3 IMPLANT
SET HNDPC FAN SPRY TIP SCT (DISPOSABLE) ×2 IMPLANT
SPONGE DRAIN TRACH 4X4 STRL 2S (GAUZE/BANDAGES/DRESSINGS) ×1 IMPLANT
SUT ETHILON 2 0 PSLX (SUTURE) ×4 IMPLANT
SUT ETHILON 3 0 PS 1 (SUTURE) ×1 IMPLANT
SUT MON AB 2-0 CT1 36 (SUTURE) ×1 IMPLANT
SWAB COLLECTION DEVICE MRSA (MISCELLANEOUS) ×1 IMPLANT
SWAB CULTURE ESWAB REG 1ML (MISCELLANEOUS) ×1 IMPLANT
TOWEL GREEN STERILE (TOWEL DISPOSABLE) ×6 IMPLANT

## 2021-03-08 NOTE — Progress Notes (Addendum)
PROGRESS NOTE    Melinda Kerr   YIA:[id number]  DOB: Jun 28, 1935  DOA: 03/04/2021 PCP: Maury Dus, MD   Brief Narrative:  Melinda Kerr is an 86 year old female with a history of hypothyroidism, essential hypertension and dyslipidemia who presents to the hospital for left hip pain which has increased since her hemiarthroplasty on 02/05/2021.  She is taken 2 courses of antibiotics for cellulitis as an outpatient.  She is admitted to the hospital for IV antibiotics   Subjective: Having pain in left groin and thigh    Assessment & Plan:   Principal Problem: Cellulitis and abscess of left thigh post op-  s/p left hemiarthoplasthy 5/11 -WBC ~ 13 -Abscess area appears to be draining a little bit today -6/10- - IR placed a CT-guided 10 French catheter - culture growing rare gram neg rods- going for I and D today- 170 cc of purulent fluid drained  - continue Vanco and Zosyn - blood cultures negative -Continue oxycodone for pain control  Active Problems: Gout - Continue allopurinol    Hypothyroidism -Continue Synthroid    Essential hypertension   Chronic diastolic CHF (congestive heart failure) (HCC) -Hold Lasix and Aldactone for now as creatinine is rising-follow creatinine  Normocytic anemia - Hemoglobin ranges from 11-12  Time spent in minutes: 35 DVT prophylaxis: enoxaparin (LOVENOX) injection 40 mg Start: 03/08/21 1000 SCDs Start: 03/04/21 1629 Code Status: DO NOT RESUSCITATE Family Communication:  Level of Care: Level of care: Med-Surg Disposition Plan:  Status is: Inpatient  Remains inpatient appropriate because:IV treatments appropriate due to intensity of illness or inability to take PO  Dispo: The patient is from: SNF              Anticipated d/c is to: SNF              Patient currently is not medically stable to d/c.   Difficult to place patient No      Consultants:  Orthopedic surgery Procedures:  IR -CT-guided drain placement Antimicrobials:   Anti-infectives (From admission, onward)    Start     Dose/Rate Route Frequency Ordered Stop   03/08/21 1213  tobramycin (NEBCIN) powder  Status:  Discontinued          As needed 03/08/21 1213 03/08/21 1304   03/08/21 0945  ceFAZolin (ANCEF) IVPB 2g/100 mL premix        2 g 200 mL/hr over 30 Minutes Intravenous To ShortStay Surgical 03/08/21 0445 03/08/21 1155   03/05/21 1500  [MAR Hold]  vancomycin (VANCOREADY) IVPB 1000 mg/200 mL        (MAR Hold since Sat 03/08/2021 at 0937.Hold Reason: Transfer to a Procedural area)   1,000 mg 200 mL/hr over 60 Minutes Intravenous Every 24 hours 03/04/21 1505     03/04/21 1600  [MAR Hold]  piperacillin-tazobactam (ZOSYN) IVPB 3.375 g        (MAR Hold since Sat 03/08/2021 at 0937.Hold Reason: Transfer to a Procedural area)   3.375 g 12.5 mL/hr over 240 Minutes Intravenous Every 8 hours 03/04/21 1546     03/04/21 1445  vancomycin (VANCOCIN) 2,250 mg in sodium chloride 0.9 % 500 mL IVPB        2,250 mg 250 mL/hr over 120 Minutes Intravenous  Once 03/04/21 1437 03/04/21 1741        Objective: Vitals:   03/08/21 0500 03/08/21 0912 03/08/21 0950 03/08/21 1310  BP:  134/66 126/71 122/73  Pulse:  65 65 66  Resp:  18  (!) 21  Temp:  (!) 97.5 F (36.4 C) 98.1 F (36.7 C) 97.9 F (36.6 C)  TempSrc:   Oral   SpO2:  96% 98% 100%  Weight: 109.7 kg     Height:        Intake/Output Summary (Last 24 hours) at 03/08/2021 1329 Last data filed at 03/08/2021 1251 Gross per 24 hour  Intake 1527.18 ml  Output 805 ml  Net 722.18 ml    Filed Weights   03/04/21 1430 03/05/21 0103 03/08/21 0500  Weight: 110.1 kg 108.9 kg 109.7 kg    Examination: General exam: Appears comfortable  HEENT: PERRLA, oral mucosa moist, no sclera icterus or thrush Respiratory system: Clear to auscultation. Respiratory effort normal. Cardiovascular system: S1 & S2 heard, regular rate and rhythm Gastrointestinal system: Abdomen soft, non-tender, nondistended. Normal bowel  sounds   Central nervous system: Alert and oriented. No focal neurological deficits. Extremities: No cyanosis, clubbing - drain in left upper thigh draining dark fluid- surrounding erythema and induration Skin: No rashes or ulcers Psychiatry:  Mood & affect appropriate.      Data Reviewed: I have personally reviewed following labs and imaging studies  CBC: Recent Labs  Lab 03/04/21 1218 03/05/21 0326 03/06/21 0150 03/07/21 0438  WBC 9.9 13.1* 13.2* 13.8*  NEUTROABS 7.6  --   --   --   HGB 11.5* 12.8 11.9* 11.1*  HCT 36.8 41.3 37.7 35.1*  MCV 100.0 100.0 97.7 97.8  PLT 503* 503* 483* 462*    Basic Metabolic Panel: Recent Labs  Lab 03/04/21 1218 03/05/21 0755 03/06/21 0150 03/07/21 0438  NA 137 138 137 136  K 3.9 4.2 4.0 3.7  CL 97* 100 101 101  CO2 33* 27 29 27   GLUCOSE 164* 126* 150* 145*  BUN 9 7* 5* 8  CREATININE 0.67 0.64 0.78 0.93  CALCIUM 9.2 9.5 9.4 9.0    GFR: Estimated Creatinine Clearance: 55.5 mL/min (by C-G formula based on SCr of 0.93 mg/dL). Liver Function Tests: Recent Labs  Lab 03/04/21 1218  AST 22  ALT 13  ALKPHOS 138*  BILITOT 0.5  PROT 5.2*  ALBUMIN 2.4*    No results for input(s): LIPASE, AMYLASE in the last 168 hours. No results for input(s): AMMONIA in the last 168 hours. Coagulation Profile: No results for input(s): INR, PROTIME in the last 168 hours. Cardiac Enzymes: No results for input(s): CKTOTAL, CKMB, CKMBINDEX, TROPONINI in the last 168 hours. BNP (last 3 results) No results for input(s): PROBNP in the last 8760 hours. HbA1C: No results for input(s): HGBA1C in the last 72 hours. CBG: No results for input(s): GLUCAP in the last 168 hours. Lipid Profile: No results for input(s): CHOL, HDL, LDLCALC, TRIG, CHOLHDL, LDLDIRECT in the last 72 hours. Thyroid Function Tests: No results for input(s): TSH, T4TOTAL, FREET4, T3FREE, THYROIDAB in the last 72 hours. Anemia Panel: No results for input(s): VITAMINB12, FOLATE,  FERRITIN, TIBC, IRON, RETICCTPCT in the last 72 hours. Urine analysis:    Component Value Date/Time   COLORURINE COLORLESS (A) 05/14/2019 1331   APPEARANCEUR CLEAR 05/14/2019 1331   LABSPEC 1.018 05/14/2019 1331   LABSPEC 1.020 05/04/2016 1006   PHURINE 5.0 05/14/2019 1331   GLUCOSEU NEGATIVE 05/14/2019 1331   GLUCOSEU Negative 05/04/2016 1006   HGBUR NEGATIVE 05/14/2019 1331   BILIRUBINUR NEGATIVE 05/14/2019 1331   BILIRUBINUR Negative 05/04/2016 1006   KETONESUR NEGATIVE 05/14/2019 1331   PROTEINUR NEGATIVE 05/14/2019 1331   UROBILINOGEN 0.2 05/04/2016 1006   NITRITE NEGATIVE 05/14/2019 1331   LEUKOCYTESUR  NEGATIVE 05/14/2019 1331   LEUKOCYTESUR Small 05/04/2016 1006   Sepsis Labs: @LABRCNTIP (procalcitonin:4,lacticidven:4) ) Recent Results (from the past 240 hour(s))  Blood culture (routine x 2)     Status: None (Preliminary result)   Collection Time: 03/04/21 12:07 PM   Specimen: BLOOD  Result Value Ref Range Status   Specimen Description BLOOD LEFT ANTECUBITAL  Final   Special Requests   Final    BOTTLES DRAWN AEROBIC AND ANAEROBIC Blood Culture adequate volume   Culture   Final    NO GROWTH 4 DAYS Performed at [facility], [place] [address] [place], [place] [address]    Report Status PENDING  Incomplete  Blood culture (routine x 2)     Status: None (Preliminary result)   Collection Time: 03/04/21  4:09 PM   Specimen: BLOOD  Result Value Ref Range Status   Specimen Description BLOOD SITE NOT SPECIFIED  Final   Special Requests   Final    BOTTLES DRAWN AEROBIC AND ANAEROBIC Blood Culture results may not be optimal due to an inadequate volume of blood received in culture bottles   Culture   Final    NO GROWTH 4 DAYS Performed at [facility], [place] [address], [place], [place] [address]    Report Status PENDING  Incomplete  Resp Panel by RT-PCR (Flu A&B, Covid) Nasopharyngeal Swab     Status: None   Collection Time: 03/04/21  5:24 PM   Specimen:  Nasopharyngeal Swab; Nasopharyngeal(NP) swabs in vial transport medium  Result Value Ref Range Status   SARS Coronavirus 2 by RT PCR NEGATIVE NEGATIVE Final    Comment: (NOTE) SARS-CoV-2 target nucleic acids are NOT DETECTED.  The SARS-CoV-2 RNA is generally detectable in upper respiratory specimens during the acute phase of infection. The lowest concentration of SARS-CoV-2 viral copies this assay can detect is 138 copies/mL. A negative result does not preclude SARS-Cov-2 infection and should not be used as the sole basis for treatment or other patient management decisions. A negative result may occur with  improper specimen collection/handling, submission of specimen other than nasopharyngeal swab, presence of viral mutation(s) within the areas targeted by this assay, and inadequate number of viral copies(<138 copies/mL). A negative result must be combined with clinical observations, patient history, and epidemiological information. The expected result is Negative.  Fact Sheet for Patients:  EntrepreneurPulse.com.au  Fact Sheet for Healthcare Providers:  IncredibleEmployment.be  This test is no t yet approved or cleared by the [place] FDA and  has been authorized for detection and/or diagnosis of SARS-CoV-2 by FDA under an Emergency Use Authorization (EUA). This EUA will remain  in effect (meaning this test can be used) for the duration of the COVID-19 declaration under Section 564(b)(1) of the Act, 21 U.S.C.section 360bbb-3(b)(1), unless the authorization is terminated  or revoked sooner.       Influenza A by PCR NEGATIVE NEGATIVE Final   Influenza B by PCR NEGATIVE NEGATIVE Final    Comment: (NOTE) The Xpert Xpress SARS-CoV-2/FLU/RSV plus assay is intended as an aid in the diagnosis of influenza from Nasopharyngeal swab specimens and should not be used as a sole basis for treatment. Nasal washings and aspirates are unacceptable for  Xpert Xpress SARS-CoV-2/FLU/RSV testing.  Fact Sheet for Patients: EntrepreneurPulse.com.au  Fact Sheet for Healthcare Providers: IncredibleEmployment.be  This test is not yet approved or cleared by the [place] FDA and has been authorized for detection and/or diagnosis of SARS-CoV-2 by FDA under an Emergency Use Authorization (EUA).  This EUA will remain in effect (meaning this test can be used) for the duration of the COVID-19 declaration under Section 564(b)(1) of the Act, 21 U.S.C. section 360bbb-3(b)(1), unless the authorization is terminated or revoked.  Performed at [facility], [place] [address], [place], [place] [address]   Aerobic/Anaerobic Culture w Gram Stain (surgical/deep wound)     Status: None (Preliminary result)   Collection Time: 03/07/21 11:53 AM   Specimen: Abscess  Result Value Ref Range Status   Specimen Description ABSCESS  Final   Special Requests DRAIN  Final   Gram Stain   Final    ABUNDANT WBC PRESENT,BOTH PMN AND MONONUCLEAR RARE GRAM NEGATIVE RODS    Culture   Final    ABUNDANT GRAM NEGATIVE RODS IDENTIFICATION AND SUSCEPTIBILITIES TO FOLLOW Performed at [facility], [place] [address], [place], [place] [address]    Report Status PENDING  Incomplete  Surgical PCR screen     Status: None   Collection Time: 03/08/21  9:32 AM   Specimen: Nasal Mucosa; Nasal Swab  Result Value Ref Range Status   MRSA, PCR NEGATIVE NEGATIVE Final   Staphylococcus aureus NEGATIVE NEGATIVE Final    Comment: (NOTE) The Xpert SA Assay (FDA approved for NASAL specimens in patients 54 years of age and older), is one component of a comprehensive surveillance program. It is not intended to diagnose infection nor to guide or monitor treatment. Performed at [facility], [place] [address] [place], [place] [address]          Radiology Studies: CT IMAGE GUIDED FLUID DRAIN BY CATHETER  Result Date:  03/07/2021 INDICATION: Recent left total hip replacement, now with indeterminate fluid collection about the anterolateral aspect of the left thigh worrisome for abscess. Please perform percutaneous aspiration and/or drainage catheter placement. EXAM: ULTRASOUND AND CT IMAGE GUIDED FLUID DRAIN BY CATHETER COMPARISON:  Left hip CT-03/04/2021 MEDICATIONS: The patient is currently admitted to the hospital and receiving intravenous antibiotics. The antibiotics were administered within an appropriate time frame prior to the initiation of the procedure. ANESTHESIA/SEDATION: Moderate (conscious) sedation was employed during this procedure. A total of Versed 1 mg and Fentanyl 50 mcg was administered intravenously. Moderate Sedation Time: 22 minutes. The patient's level of consciousness and vital signs were monitored continuously by radiology nursing throughout the procedure under my direct supervision. CONTRAST:  None COMPLICATIONS: None immediate. PROCEDURE: Informed written consent was obtained from the patient after a discussion of the risks, benefits and alternatives to treatment. The patient was placed supine on the CT gantry and a pre procedural CT was performed re-demonstrating the known abscess/fluid collection within the subcutaneous tissues of the anterolateral aspect of the left thigh with dominant air in fluid containing component measuring at least 8.2 x 5.0 cm (image 23, series 2). The CT gantry table position was marked and the collection was identified sonographically. The procedure was planned. A timeout was performed prior to the initiation of the procedure. The skin overlying the lateral aspect the left thigh was prepped and draped in the usual sterile fashion. The overlying soft tissues were anesthetized with 1% lidocaine with epinephrine. Under direct ultrasound guidance, the inferior aspect the complex fluid collection was accessed with an 18 gauge trocar needle allowing coiling of a short Amplatz wire  within the collection. Multiple ultrasound images were saved procedural documentation purposes. Appropriate positioning was confirmed with a limited CT scan. The tract was serially dilated allowing placement of a 10 [place] all-purpose drainage catheter. Appropriate  positioning was confirmed with a limited postprocedural CT scan. Approximately 170 ml of purulent fluid was aspirated. The tube was connected to a JP bulb and sutured in place. A dressing was placed. The patient tolerated the procedure well without immediate post procedural complication. IMPRESSION: Successful ultrasound and CT guided placement of a 10 French all purpose drain catheter into the abscess within the subcutaneous tissues about the anterolateral aspect the left thigh with aspiration of 170 mL of purulent fluid. Samples were sent to the laboratory as requested by the ordering clinical team. Electronically Signed   By: Sandi Mariscal M.D.   On: 03/07/2021 12:42      Scheduled Meds:  [MAR Hold] allopurinol  300 mg Oral Daily   [MAR Hold] aspirin EC  81 mg Oral BID WC   [MAR Hold] citalopram  10 mg Oral Daily   [MAR Hold] docusate sodium  100 mg Oral BID   [MAR Hold] enoxaparin (LOVENOX) injection  40 mg Subcutaneous Daily   [MAR Hold] levothyroxine  125 mcg Oral QAC breakfast   [MAR Hold] metoCLOPramide  5 mg Oral BID AC & HS   [MAR Hold] mupirocin ointment  1 application Nasal BID   [MAR Hold] oxyCODONE  5 mg Oral Q4H while awake   [MAR Hold] pantoprazole  40 mg Oral BID AC   [MAR Hold] sodium chloride flush  5 mL Intracatheter Q8H   Continuous Infusions:  [MAR Hold] sodium chloride 250 mL (03/05/21 1457)   lactated ringers 10 mL/hr at 03/08/21 0954   [MAR Hold] piperacillin-tazobactam (ZOSYN)  IV 12.5 mL/hr at 03/08/21 0543   [MAR Hold] vancomycin Stopped (03/07/21 1714)     LOS: 4 days      Debbe Odea, MD [facility] Pager: [email or web address] 03/08/2021, 1:29 PM

## 2021-03-08 NOTE — Progress Notes (Signed)
   Subjective:  Recheck left hip Pt had drain placed yesterday with purulent material drained Plan for I&D later this morning Denies any new symptoms or issues Currently NPO  Patient reports pain as mild.  Objective:   VITALS:   Vitals:   03/07/21 1501 03/07/21 2024  BP: (!) 131/105 124/73  Pulse: 70 73  Resp: 17 16  Temp: 97.8 F (36.6 C)   SpO2: 95% 91%    Left hip dressing intact with drain intact with continued fluid Nv intact distally Mild edema bilateral distally Not taken thru any rom  LABS Recent Labs    03/06/21 0150 03/07/21 0438  HGB 11.9* 11.1*  HCT 37.7 35.1*  WBC 13.2* 13.8*  PLT 483* 462*    Recent Labs    03/05/21 0755 03/06/21 0150 03/07/21 0438  NA 138 137 136  K 4.2 4.0 3.7  BUN 7* 5* 8  CREATININE 0.64 0.78 0.93  GLUCOSE 126* 150* 145*     Assessment/Plan:  Plan for I&D of left hip later this morning Keep NPO Pain management  Non weight bearing left lower extremity     Brad Luna [place], [place] is now [facility]  [place] [address] [place], [place], [place] [address] Phone: [phone] [email or web address] Facebook  [employer]

## 2021-03-08 NOTE — Anesthesia Procedure Notes (Signed)
Procedure Name: Intubation Date/Time: 03/08/2021 11:23 AM Performed by: Inda Coke, CRNA Pre-anesthesia Checklist: Patient identified, Emergency Drugs available, Suction available and Patient being monitored Patient Re-evaluated:Patient Re-evaluated prior to induction Oxygen Delivery Method: Circle System Utilized Preoxygenation: Pre-oxygenation with 100% oxygen Induction Type: IV induction Ventilation: Mask ventilation without difficulty Laryngoscope Size: Mac and 3 Grade View: Grade II Tube type: Oral Tube size: 7.0 mm Number of attempts: 1 Airway Equipment and Method: Stylet and Oral airway Placement Confirmation: ETT inserted through vocal cords under direct vision, positive ETCO2 and breath sounds checked- equal and bilateral Secured at: 22 cm Tube secured with: Tape Dental Injury: Teeth and Oropharynx as per pre-operative assessment

## 2021-03-08 NOTE — Interval H&P Note (Signed)
History and Physical Interval Note:  03/08/2021 10:34 AM  Melinda Kerr  has presented today for surgery, with the diagnosis of Left Hip surgical site infection.  The various methods of treatment have been discussed with the patient and family. After consideration of risks, benefits and other options for treatment, the patient has consented to  Procedure(s): [place] (Left) as a surgical intervention.  The patient's history has been reviewed, patient examined, no change in status, stable for surgery.  I have reviewed the patient's chart and labs.  Questions were answered to the patient's satisfaction.     Hilton Cork Ingri Diemer

## 2021-03-08 NOTE — Anesthesia Postprocedure Evaluation (Signed)
Anesthesia Post Note  Patient: Keeghan Berton Mount  Procedure(s) Performed: LEFT HIP IRRIGATION AND DEBRIDEMENT (Left) APPLICATION OF WOUND VAC (Left: Hip)     Patient location during evaluation: PACU Anesthesia Type: General Level of consciousness: awake and alert and patient cooperative Pain management: pain level controlled Vital Signs Assessment: post-procedure vital signs reviewed and stable Respiratory status: spontaneous breathing, nonlabored ventilation, respiratory function stable and patient connected to nasal cannula oxygen Cardiovascular status: blood pressure returned to baseline and stable Postop Assessment: no apparent nausea or vomiting Anesthetic complications: no   No notable events documented.  Last Vitals:  Vitals:   03/08/21 1340 03/08/21 1355  BP: 109/66 117/67  Pulse: 74 74  Resp: (!) 22 17  Temp:  36.7 C  SpO2: 97% 97%    Last Pain:  Vitals:   03/08/21 1340  TempSrc:   PainSc: 0-No pain                 Demetrica Zipp,E. Shaquetta Arcos

## 2021-03-08 NOTE — Op Note (Signed)
OPERATIVE REPORT   03/08/2021  1:08 PM  PATIENT:  Melinda Kerr   SURGEON:  Bertram Savin, MD  ASSISTANT:  Staff.   PREOPERATIVE DIAGNOSIS:  Left lateral thigh superficial abscess.  POSTOPERATIVE DIAGNOSIS:  Same.  PROCEDURE:  1.  Excisional debridement of skin and subcutaneous tissue left hip. 2. Application of negative pressure incisional dressing.  ANESTHESIA:   GETA.  ANTIBIOTICS:  Receiving scheduled Zosyn and Vancomycin.  IMPLANTS:  None.  SPECIMENS:  Left hip superficial abscess for gram stain and culture.  TUBES AND DRAINS: 1. 10 mm flat JP drain in subcutaneous tissue. 2. Prevena negative pressure incisional dressing at 125 mm Hg.  COMPLICATIONS:  None.  DISPOSITION:  Stable to PACU.  SURGICAL INDICATIONS:  Melinda Kerr is a 86 y.o. female who underwent left hip hemiarthroplasty for pain control on 02/05/21. She developed 2 small areas of delayed wound healing. During her postoperative course, she developed skin breakdown on the lateral thigh from the use of adhesive tape to secure her dressing. She later developed cellulitis at the site of breakdown, which did not respond to oral antibiotics. She was admitted to the hospitalist for IV antibiotics. She then developed fluctuance over this same site, and IR performed an aspiration with placement of a pigtail drain, which yielded purulent material. Gram stain showed gram negative rods, and the culture is pending. She was then indicated for formal irrigation and debridement.  The risks, benefits, and alternatives were discussed with the patient preoperatively including but not limited to the risks of infection, bleeding, nerve / blood vessel injury, cardiopulmonary complications, the need for repeat surgery, among others, and the patient was willing to proceed.  PROCEDURE IN DETAIL: The patient was identified in the holding area. The surgical site was marked. She was taken to the operating room, and general  anesthesia was obtained. She was transferred to the [facility] table, and all bony prominences were well padded. The pigtail drain was removed, and the hip was prepped and draped in the normal sterile fashion. She received 2 g Ancef within 60 minutes of beginning the procedure and was already receiving scheduled IV antibiotics.  Using a #10 blade, I excised the skin and subcutaneous tissue of the distal half of her previous incision. No purulent material was encountered. I raised full thickness skin flaps using blunt digital dissection. I then used mayo scissors to reach the area of lateral fluctuance, which did not communicate with the hip incision. A small amount of seropurulent fluid was encountered and evacuated. A sample was sent for culture. Using a rongeur, I excised all nonviable fatty tissue. I then inspected the fascial suture line, which was intact.  The wound was irrigated with Irrisept solution, followed by 3 L of normal saline using pulse lavage. 2.4 grams of Tobramycin powder was placed within the wound bed. A stab incision was made with a #10 blade distally in line with the incision, and a 10 mm flat JP drain was placed in the subcutaneous tissue. The wound was closed with 2-0 Monocryl for the deep dermal layer, followed by 2-0 nylon vertical mattress sutures. Customizable Prevena negative pressure dressing was applied and hooked up to the VAC unit at 125 mm Hg without any leak. A drain sponge and Tegaderm were applied to the drain site. The patient was then awakened from anesthesia, and taken to the recovery room in stable condition. Sponge, needle, and instrument counts were correct x2. There were no known complications.    Debridement type:  Excisional Debridement  Side: left  Body Location: thigh   Tools used for debridement: scalpel and rongeur  Pre-debridement Wound size (cm):   Length: None        Width: None     Depth: None   Post-debridement Wound size (cm):   Length: 8 cm         Width: N/A     Depth: 4 cm   Debridement depth beyond dead/damaged tissue down to healthy viable tissue: yes  Tissue layer involved: skin, subcutaneous tissue  Nature of tissue removed: Non-viable tissue  Irrigation volume: 3 L saline     Irrigation fluid type: Irricept  POSTOPERATIVE PLAN:  The patient will be admitted to the hospitalist. Resume ASA 81 mg PO BID for DVT prophylaxis. Continue IV vancomycin and Zosyn for now, and tailor to cultures. She will require ID consult for antibiotic selection and duration. She will likely need a PICC line for prolonged IV antibiotics. Upon discharge, the house [facility] unit will be exchanged for a portable Prevena suction unit. She will need to schedule a follow up appointment within 7 days of discharge for [facility] removal. Plan for discharge with the JP drain.

## 2021-03-08 NOTE — Transfer of Care (Signed)
Immediate Anesthesia Transfer of Care Note  Patient: Melinda Kerr  Procedure(s) Performed: LEFT HIP IRRIGATION AND DEBRIDEMENT (Left) APPLICATION OF WOUND VAC (Left: Hip)  Patient Location: PACU  Anesthesia Type:General  Level of Consciousness: awake, alert  and oriented  Airway & Oxygen Therapy: Patient Spontanous Breathing and Patient connected to face mask oxygen  Post-op Assessment: Report given to RN and Post -op Vital signs reviewed and stable  Post vital signs: Reviewed and stable  Last Vitals:  Vitals Value Taken Time  BP 122/73 03/08/21 1310  Temp    Pulse 66 03/08/21 1312  Resp 18 03/08/21 1312  SpO2 100 % 03/08/21 1312  Vitals shown include unvalidated device data.  Last Pain:  Vitals:   03/08/21 0950  TempSrc: Oral  PainSc: 6       Patients Stated Pain Goal: 1 (09/20/48 7530)  Complications: No notable events documented.

## 2021-03-08 NOTE — Anesthesia Preprocedure Evaluation (Addendum)
Anesthesia Evaluation  Patient identified by MRN, date of birth, ID band Patient awake    Reviewed: Allergy & Precautions, NPO status , Patient's Chart, lab work & pertinent test results  History of Anesthesia Complications Negative for: history of anesthetic complications  Airway Mallampati: II  TM Distance: >3 FB Neck ROM: Full    Dental  (+) Missing, Caps, Dental Advisory Given   Pulmonary sleep apnea and Continuous Positive Airway Pressure Ventilation , COPD,  COPD inhaler,    breath sounds clear to auscultation       Cardiovascular hypertension, Pt. on medications + DVT   Rhythm:Regular Rate:Normal  01/2021 ECHO: EF 60-65%, normal LVF, grade 2 DD, no significant valvular abnormalities   Neuro/Psych Depression TIA   GI/Hepatic GERD  Medicated,  Endo/Other  diabetes (glu 145)Hypothyroidism Morbid obesity  Renal/GU    Uterine cancer: Chemo, XRT, surgery    Musculoskeletal  (+) Arthritis ,   Abdominal (+) + obese,   Peds  Hematology  (+) Blood dyscrasia (Hb 11.1), anemia ,   Anesthesia Other Findings   Reproductive/Obstetrics                            Anesthesia Physical Anesthesia Plan  ASA: 3  Anesthesia Plan: General   Post-op Pain Management:    Induction: Intravenous  PONV Risk Score and Plan: 3 and Ondansetron, Dexamethasone and Treatment may vary due to age or medical condition  Airway Management Planned: Oral ETT  Additional Equipment: None  Intra-op Plan:   Post-operative Plan: Extubation in OR  Informed Consent: I have reviewed the patients History and Physical, chart, labs and discussed the procedure including the risks, benefits and alternatives for the proposed anesthesia with the patient or authorized representative who has indicated his/her understanding and acceptance.   Patient has DNR.  Discussed DNR with patient and Suspend DNR.   Dental advisory  given  Plan Discussed with: CRNA and Surgeon  Anesthesia Plan Comments:        Anesthesia Quick Evaluation

## 2021-03-09 ENCOUNTER — Encounter (HOSPITAL_COMMUNITY): Payer: Self-pay | Admitting: Orthopedic Surgery

## 2021-03-09 ENCOUNTER — Inpatient Hospital Stay: Payer: Self-pay

## 2021-03-09 DIAGNOSIS — I5032 Chronic diastolic (congestive) heart failure: Secondary | ICD-10-CM | POA: Diagnosis not present

## 2021-03-09 DIAGNOSIS — L02416 Cutaneous abscess of left lower limb: Secondary | ICD-10-CM | POA: Diagnosis not present

## 2021-03-09 DIAGNOSIS — F32A Depression, unspecified: Secondary | ICD-10-CM | POA: Diagnosis not present

## 2021-03-09 DIAGNOSIS — L03114 Cellulitis of left upper limb: Secondary | ICD-10-CM | POA: Diagnosis not present

## 2021-03-09 LAB — BASIC METABOLIC PANEL
Anion gap: 5 (ref 5–15)
BUN: 9 mg/dL (ref 8–23)
CO2: 30 mmol/L (ref 22–32)
Calcium: 9.2 mg/dL (ref 8.9–10.3)
Chloride: 100 mmol/L (ref 98–111)
Creatinine, Ser: 0.75 mg/dL (ref 0.44–1.00)
GFR, Estimated: >60 mL/min (ref >60)
Glucose, Bld: 207 mg/dL — ABNORMAL HIGH (ref 70–99)
Potassium: 3.9 mmol/L (ref 3.5–5.1)
Sodium: 135 mmol/L (ref 135–145)

## 2021-03-09 LAB — CBC
HCT: 32.7 % — ABNORMAL LOW (ref 36.0–46.0)
Hemoglobin: 10.1 g/dL — ABNORMAL LOW (ref 12.0–15.0)
MCH: 30.5 pg (ref 26.0–34.0)
MCHC: 30.9 g/dL (ref 30.0–36.0)
MCV: 98.8 fL (ref 80.0–100.0)
Platelets: 432 10*3/uL — ABNORMAL HIGH (ref 150–400)
RBC: 3.31 MIL/uL — ABNORMAL LOW (ref 3.87–5.11)
RDW: 14.4 % (ref 11.5–15.5)
WBC: 13.2 10*3/uL — ABNORMAL HIGH (ref 4.0–10.5)
nRBC: 0 % (ref 0.0–0.2)

## 2021-03-09 LAB — CULTURE, BLOOD (ROUTINE X 2)
Culture: NO GROWTH
Culture: NO GROWTH
Special Requests: ADEQUATE

## 2021-03-09 LAB — VANCOMYCIN, RANDOM: Vancomycin Rm: 14

## 2021-03-09 LAB — VANCOMYCIN, TROUGH: Vancomycin Tr: 12 ug/mL — ABNORMAL LOW (ref 15–20)

## 2021-03-09 MED ORDER — VANCOMYCIN HCL 1250 MG/250ML IV SOLN: 1250.0000 mg | INTRAVENOUS | Status: DC

## 2021-03-09 NOTE — Plan of Care (Signed)

## 2021-03-09 NOTE — Progress Notes (Signed)
Patient refuses NIV for the night

## 2021-03-09 NOTE — Progress Notes (Signed)
Occupational Therapy Evaluation Patient Details Name: Melinda Kerr MRN: [id number] DOB: 1935-04-05 Today's Date: 03/09/2021    History of Present Illness 86 y.o. female presents to [facility] ED on 03/04/2021 with L hip pain and redness after recent L hip hemiarthroplasty on 02/05/2021. Pt underwent US and CT guided catheter placement to drain left anterior-lateral thigh abscess on 03/07/2021. PMH includes chronic diastolic congestive heart failure, essential hypertension, CAD, history of CVA, OSA on CPAP, HLD, endometrial cancer, Hx DVT.   Clinical Impression   Melinda Kerr was evaluated s/p the above impairments. PTA pt was at [facility] and required assistance with all ADLs at bed level, she reports practicing walking with rehab +2, and was transferring dependently. Upon my arrival pt noted with resting tremor in all four extremities and tremor was exacerbated with functional activity of extremity and ROM. Pt states she does not have tremor at baseline, and may be because "she is cold." Offered warm blanket & she declined. Pt refused sitting EOB this session due to back pain. She is currently max/total assist for all bed mobility. Pt would benefit from OT acutely to progress function in all ADLs and mobility. Recommend d/c back to SNF    Follow Up Recommendations  SNF;Supervision/Assistance - 24 hour    Equipment Recommendations  None recommended by OT       Precautions / Restrictions Precautions Precautions: Fall Precaution Comments: L hip drain Restrictions Weight Bearing Restrictions: Yes LLE Weight Bearing: Weight bearing as tolerated      Mobility Bed Mobility Overal bed mobility: Needs Assistance Bed Mobility: Rolling Rolling: Max assist         General bed mobility comments: pt refused sitting EOB due to back pain    Transfers Overall transfer level: Needs assistance               General transfer comment: deferred this session due to safety    Balance Overall balance assessment:  Needs assistance                                         ADL either performed or assessed with clinical judgement   ADL Overall ADL's : Needs assistance/impaired Eating/Feeding: Set up;Bed level   Grooming: Wash/dry hands;Wash/dry face;Oral care;Applying deodorant;Brushing hair;Bed level   Upper Body Bathing: Moderate assistance;Bed level   Lower Body Bathing: Maximal assistance;Bed level   Upper Body Dressing : Moderate assistance;Bed level   Lower Body Dressing: Total assistance;Bed level   Toilet Transfer: Total assistance Toilet Transfer Details (indicate cue type and reason): bed level Toileting- Clothing Manipulation and Hygiene: Total assistance;Bed level       Functional mobility during ADLs: Total assistance General ADL Comments: ADLs at bed level; pt refused to attempt sitting EOB     Vision         Perception     Praxis      Pertinent Vitals/Pain Pain Assessment: Faces Faces Pain Scale: Hurts little more Pain Location: L hip Pain Descriptors / Indicators: Aching Pain Intervention(s): Limited activity within patient's tolerance;Monitored during session     Hand Dominance Right   Extremity/Trunk Assessment Upper Extremity Assessment Upper Extremity Assessment: RUE deficits/detail RUE Deficits / Details: shoulder flexion limited to 90 degrees actively, 4-/5 grossly RUE Sensation: WNL RUE Coordination: decreased fine motor (slow and deliberate) LUE Deficits / Details: 4-/5 gross strength, AROM WFL       Cervical / Trunk  Assessment Cervical / Trunk Assessment: Other exceptions;Kyphotic (increased body habitus, pt reports sever back pain with bed mobility) Cervical / Trunk Exceptions: morbid obesity   Communication Communication Communication: No difficulties   Cognition Arousal/Alertness: Awake/alert Behavior During Therapy: WFL for tasks assessed/performed Overall Cognitive Status: No family/caregiver present to determine  baseline cognitive functioning                                 General Comments: follows commands, answers questions appropriately; oriented x4   General Comments  pt with generalized resting tremor throughout all 4 extremeties, she reports they are because she is cold, offered warm blanket and pt declined. LUE tremor exacerbated with shoulder flexion, ROM and functional tasks    Exercises     Shoulder Instructions      Home Living Family/patient expects to be discharged to:: Skilled nursing facility                                 Additional Comments: Pt from [facility] with plan to return; pt states she does not bathe at [facility], and requries assistance for dressing; she was working on walking with parallel bars prior to this admission (`)      Prior Functioning/Environment Level of Independence: Needs assistance  Gait / Transfers Assistance Needed: pt reports being largely bed bound with use of lift for transfers and physical assistance for all functional mobility. Pt also reports working with therapies on standing, and working on ambulating in parallel bars before recent hip fracture, needing 2 person assistance for all mobility. ADL's / Homemaking Assistance Needed: Required assist for bathing, dressing and toileting, could self feed and groom Communication / Swallowing Assistance Needed: [facility]          OT Problem List: Decreased strength;Decreased range of motion;Decreased activity tolerance;Decreased safety awareness;Decreased knowledge of use of DME or AE;Decreased knowledge of precautions;Pain      OT Treatment/Interventions: Self-care/ADL training;Therapeutic exercise;DME and/or AE instruction;Therapeutic activities;Balance training;Patient/family education    OT Goals(Current goals can be found in the care plan section) Acute Rehab OT Goals Patient Stated Goal: reduce pain and walk with PT again OT Goal Formulation: With patient Time For Goal  Achievement: 03/23/21 Potential to Achieve Goals: Good ADL Goals Pt Will Perform Eating: Independently;sitting Pt Will Perform Grooming: with set-up;sitting Pt Will Perform Upper Body Bathing: with set-up;sitting Pt Will Perform Lower Body Bathing: with mod assist;sit to/from stand Pt Will Perform Upper Body Dressing: with set-up;sitting Pt Will Perform Lower Body Dressing: with mod assist;sit to/from stand Pt Will Transfer to Toilet: with mod assist;stand pivot transfer;bedside commode  OT Frequency: Min 2X/week    AM-PAC OT "6 Clicks" Daily Activity     Outcome Measure Help from another person eating meals?: A Little Help from another person taking care of personal grooming?: A Little Help from another person toileting, which includes using toliet, bedpan, or urinal?: Total Help from another person bathing (including washing, rinsing, drying)?: A Lot Help from another person to put on and taking off regular upper body clothing?: A Lot Help from another person to put on and taking off regular lower body clothing?: A Lot 6 Click Score: 13   End of Session Nurse Communication: Mobility status  Activity Tolerance: Patient limited by pain Patient left: in bed;with call bell/phone within reach  OT Visit Diagnosis: Other abnormalities of gait and mobility (R26.89);Pain  Pain - part of body:  (back)                Time: 1348-1400 OT Time Calculation (min): 12 min Charges:  OT General Charges $OT Visit: 1 Visit OT Evaluation $OT Eval Moderate Complexity: 1 Mod    Shanasia Ibrahim A Lanayah Gartley 03/09/2021, 2:13 PM

## 2021-03-09 NOTE — Progress Notes (Signed)
PT Progress Note  New order received for PT eval. Pt currently on caseload. Eval completed 6/10. Pt returned to OR 6/11 for I&D of hip abscess. PT to continue per current POC.  Lorrin Goodell, PT  Office # [phone] Pager [phone]

## 2021-03-09 NOTE — Progress Notes (Signed)
Subjective: 1 Day Post-Op Procedure(s) (LRB): LEFT HIP IRRIGATION AND DEBRIDEMENT (Left) APPLICATION OF WOUND VAC (Left) Patient reports pain as mild.   Patient seen in rounds for Dr. Lyla Glassing. Patient is resting in bed comfortably this morning. No acute events overnight. She denies CP, SHOB, N/V.  We will start therapy today.   Objective: Vital signs in last 24 hours: Temp:  [97.5 F (36.4 C)-98.4 F (36.9 C)] 98 F (36.7 C) (06/12 0033) Pulse Rate:  [65-100] 100 (06/12 0033) Resp:  [16-22] 16 (06/12 0033) BP: (109-134)/(61-73) 122/61 (06/12 0033) SpO2:  [94 %-100 %] 98 % (06/12 0033) Weight:  [111.6 kg] 111.6 kg (06/12 0500)  Intake/Output from previous day:  Intake/Output Summary (Last 24 hours) at 03/09/2021 0902 Last data filed at 03/09/2021 0531 Gross per 24 hour  Intake 993.35 ml  Output 810 ml  Net 183.35 ml     Intake/Output this shift: No intake/output data recorded.  Labs: Recent Labs    03/07/21 0438 03/09/21 0335  HGB 11.1* 10.1*   Recent Labs    03/07/21 0438 03/09/21 0335  WBC 13.8* 13.2*  RBC 3.59* 3.31*  HCT 35.1* 32.7*  PLT 462* 432*   Recent Labs    03/07/21 0438 03/09/21 0335  NA 136 135  K 3.7 3.9  CL 101 100  CO2 27 30  BUN 8 9  CREATININE 0.93 0.75  GLUCOSE 145* 207*  CALCIUM 9.0 9.2   No results for input(s): LABPT, INR in the last 72 hours.  Exam: General - Patient is Alert and Appropriate Extremity - Neurologically intact Sensation intact distally Intact pulses distally Dorsiflexion/Plantar flexion intact Dressing - dressing C/D/I Wound vac in place and functioning JP drain in place with some output Motor Function - intact, moving foot and toes well on exam.   Past Medical History:  Diagnosis Date   Arthritis    "shoulders, knees, lower back" (07/14/2017)   CHF (congestive heart failure) ([place])    "while in hospital w/hysterectomy in 2017"   Chronic lower back pain    Depression    Diverticulitis of large  intestine with abscess 07/13/2017   DVT (deep venous thrombosis) ([facility])    Dyspnea    "cause I'm over weight"   Esophageal reflux    Gout    Hyperlipidemia    under control   Hypertension    Hypothyroidism    Morbid obesity with BMI of 40.0-44.9, adult ([place])    Nocturia    OSA on CPAP    "not wearing it when I sleep in my lift chair" (07/14/2017)   Osteoarthritis    Phlebitis    hx of   Pneumonia ?1989   Spondylosis    with scoliosis   Type II diabetes mellitus (HCC)    borderline , diet controlled    Urinary frequency    Uterine cancer ([facility])    S/P chemo, radiation, hysterectomy    Assessment/Plan: 1 Day Post-Op Procedure(s) (LRB): LEFT HIP IRRIGATION AND DEBRIDEMENT (Left) APPLICATION OF WOUND VAC (Left) Principal Problem:   Abscess of hip, left Active Problems:   OSA on CPAP   Hypothyroidism   Dyslipidemia   Depression   Essential hypertension   Cellulitis   Chronic diastolic CHF (congestive heart failure) (H[facility])  Estimated body mass index is 40.94 kg/m as calculated from the following:   Height as of this encounter: 5\' 5"  (1.651 m).   Weight as of this encounter: 111.6 kg. Advance diet Up with therapy  DVT Prophylaxis -  Aspirin 81 BID Weight bearing as tolerated Wound vac to remain in place - will transition to [place] prior to discharge JP drain to remain in place, continue drain care  Will consult ID today, possible PICC line placement Cultures from CT guided drain reveal abundant Proteus Mirabilis Susceptibilities pending Intra-op cultures taken, pending On Vanc and Zosyn currently   Griffith Citron, PA-C Orthopedic Surgery [phone] 03/09/2021, 9:02 AM

## 2021-03-09 NOTE — Progress Notes (Signed)
Pt refused cpap for tonight. Pt does not wear cpap at home.

## 2021-03-09 NOTE — Progress Notes (Signed)
Pharmacy Antibiotic Note  Melinda Kerr is a 86 y.o. female admitted on 03/04/2021 with cellulitis.  Pharmacy has been consulted for vancomycin and Zosyn dosing.  1 day postop I&D and application of wound vac for left hip. Cultures from L hip are showing abundant proteus mirabilis - pending susceptibilities. Ortho to consult ID, possible PICC.   Vancomycin levels collected today with calculated AUC of 394 (subtherapeutic).  Plan: Increase vancomycin to 1250 mg IV q24h (estimated AUC 493) Continue Zosyn 3.375mg  IV q8hr   Height: 5\' 5"  (165.1 cm) Weight: 111.6 kg (246 lb 0.5 oz) IBW/kg (Calculated) : 57  Temp (24hrs), Avg:98.4 F (36.9 C), Min:98 F (36.7 C), Max:98.7 F (37.1 C)  Recent Labs  Lab 03/04/21 1218 03/05/21 0326 03/05/21 0755 03/06/21 0150 03/07/21 0438 03/09/21 0335 03/09/21 0831  WBC 9.9 13.1*  --  13.2* 13.8* 13.2*  --   CREATININE 0.67  --  0.64 0.78 0.93 0.75  --   LATICACIDVEN 1.5  --   --   --   --   --   --   VANCORANDOM  --   --   --   --   --   --  14     Estimated Creatinine Clearance: 65.1 mL/min (by C-G formula based on SCr of 0.75 mg/dL).    Allergies  Allergen Reactions   Oxybutynin Other (See Comments)    Bleeding gums, felt sick    Antimicrobials this admission: Vancomycin 6/7 >> Pip/tazo 6/7 >>  Microbiology: 6/7 BCx - NGTD 6/10 abscess - proteus (pending susceptibilities) 6/11 OR culture - pending  Thank you for allowing pharmacy to be a part of this patient's care.  Kerby Nora, PharmD, BCPS Clinical Pharmacist Please check [email or web address] for unit-specific pharmacist phone numbers

## 2021-03-09 NOTE — Progress Notes (Addendum)
PROGRESS NOTE    CAMDYNN MARANTO   EGB:[id number]  DOB: 1935-07-28  DOA: 03/04/2021 PCP: Maury Dus, MD   Brief Narrative:  Melinda Kerr is an 86 year old female with a history of hypothyroidism, essential hypertension and dyslipidemia who presents to the hospital for left hip pain which has increased since her hemiarthroplasty on 02/05/2021.  She is taken 2 courses of antibiotics for cellulitis as an outpatient.  She is admitted to the hospital for IV antibiotics   Subjective: She has no complaints today.    Assessment & Plan:   Principal Problem: Cellulitis and abscess of left thigh post op-  s/p left hemiarthoplasthy 5/11 -WBC ~ 13 -Abscess area appears to be draining a little bit today -6/10- - IR placed a CT-guided 10 French catheter- 170 cc of purulent fluid drained - 6/11> went to OR for I and D by Dr Lyla Glassing- has wound vac now - culture growing abundant proteus Mirabilis - she has been on Vanco and Zosyn- I will dc Vanc today - blood cultures negative - Ortho notes state that they will consult ID today  Active Problems: Gout - Continue allopurinol    Hypothyroidism -Continue Synthroid    Essential hypertension   Chronic diastolic CHF (congestive heart failure) (HCC) -Hold Lasix and Aldactone for now as creatinine was rising -following creatinine  Normocytic anemia - Hemoglobin ranges from 11-12  Time spent in minutes: 35 DVT prophylaxis: SCDs Start: 03/08/21 1425 Code Status: DO NOT RESUSCITATE Family Communication:  Level of Care: Level of care: Med-Surg Disposition Plan:  Status is: Inpatient  Remains inpatient appropriate because:IV treatments appropriate due to intensity of illness or inability to take PO  Dispo: The patient is from: SNF              Anticipated d/c is to: SNF              Patient currently is not medically stable to d/c.   Difficult to place patient No      Consultants:  Orthopedic surgery Procedures:  IR -CT-guided  drain placement Antimicrobials:  Anti-infectives (From admission, onward)    Start     Dose/Rate Route Frequency Ordered Stop   03/10/21 1500  vancomycin (VANCOREADY) IVPB 1250 mg/250 mL        1,250 mg 166.7 mL/hr over 90 Minutes Intravenous Every 24 hours 03/09/21 1500     03/08/21 1213  tobramycin (NEBCIN) powder  Status:  Discontinued          As needed 03/08/21 1213 03/08/21 1304   03/08/21 0945  ceFAZolin (ANCEF) IVPB 2g/100 mL premix        2 g 200 mL/hr over 30 Minutes Intravenous To ShortStay Surgical 03/08/21 0445 03/08/21 1155   03/05/21 1500  vancomycin (VANCOREADY) IVPB 1000 mg/200 mL  Status:  Discontinued        1,000 mg 200 mL/hr over 60 Minutes Intravenous Every 24 hours 03/04/21 1505 03/09/21 1500   03/04/21 1600  piperacillin-tazobactam (ZOSYN) IVPB 3.375 g        3.375 g 12.5 mL/hr over 240 Minutes Intravenous Every 8 hours 03/04/21 1546     03/04/21 1445  vancomycin (VANCOCIN) 2,250 mg in sodium chloride 0.9 % 500 mL IVPB        2,250 mg 250 mL/hr over 120 Minutes Intravenous  Once 03/04/21 1437 03/04/21 1741        Objective: Vitals:   03/09/21 0033 03/09/21 0500 03/09/21 0945 03/09/21 1424  BP: 122/61  (!) 115/58  115/64  Pulse: 100  63 67  Resp: 16  17 17   Temp: 98 F (36.7 C)  98.5 F (36.9 C) 98.7 F (37.1 C)  TempSrc: Oral  Oral Oral  SpO2: 98%  100% 99%  Weight:  111.6 kg    Height:        Intake/Output Summary (Last 24 hours) at 03/09/2021 1711 Last data filed at 03/09/2021 1215 Gross per 24 hour  Intake 443.35 ml  Output 830 ml  Net -386.65 ml    Filed Weights   03/05/21 0103 03/08/21 0500 03/09/21 0500  Weight: 108.9 kg 109.7 kg 111.6 kg    Examination: General exam: Appears comfortable  HEENT: PERRLA, oral mucosa moist, no sclera icterus or thrush Respiratory system: Clear to auscultation. Respiratory effort normal. Cardiovascular system: S1 & S2 heard, regular rate and rhythm Gastrointestinal system: Abdomen soft, non-tender,  nondistended. Normal bowel sounds   Central nervous system: Alert and oriented. No focal neurological deficits. Extremities: No cyanosis, clubbing or edema- drain in left groin draining blood tinged fluid- erythema and induration resolved Skin: No rashes or ulcers Psychiatry:  Mood & affect appropriate.      Data Reviewed: I have personally reviewed following labs and imaging studies  CBC: Recent Labs  Lab 03/04/21 1218 03/05/21 0326 03/06/21 0150 03/07/21 0438 03/09/21 0335  WBC 9.9 13.1* 13.2* 13.8* 13.2*  NEUTROABS 7.6  --   --   --   --   HGB 11.5* 12.8 11.9* 11.1* 10.1*  HCT 36.8 41.3 37.7 35.1* 32.7*  MCV 100.0 100.0 97.7 97.8 98.8  PLT 503* 503* 483* 462* 432*    Basic Metabolic Panel: Recent Labs  Lab 03/04/21 1218 03/05/21 0755 03/06/21 0150 03/07/21 0438 03/09/21 0335  NA 137 138 137 136 135  K 3.9 4.2 4.0 3.7 3.9  CL 97* 100 101 101 100  CO2 33* 27 29 27 30   GLUCOSE 164* 126* 150* 145* 207*  BUN 9 7* 5* 8 9  CREATININE 0.67 0.64 0.78 0.93 0.75  CALCIUM 9.2 9.5 9.4 9.0 9.2    GFR: Estimated Creatinine Clearance: 65.1 mL/min (by C-G formula based on SCr of 0.75 mg/dL). Liver Function Tests: Recent Labs  Lab 03/04/21 1218  AST 22  ALT 13  ALKPHOS 138*  BILITOT 0.5  PROT 5.2*  ALBUMIN 2.4*    No results for input(s): LIPASE, AMYLASE in the last 168 hours. No results for input(s): AMMONIA in the last 168 hours. Coagulation Profile: No results for input(s): INR, PROTIME in the last 168 hours. Cardiac Enzymes: No results for input(s): CKTOTAL, CKMB, CKMBINDEX, TROPONINI in the last 168 hours. BNP (last 3 results) No results for input(s): PROBNP in the last 8760 hours. HbA1C: No results for input(s): HGBA1C in the last 72 hours. CBG: No results for input(s): GLUCAP in the last 168 hours. Lipid Profile: No results for input(s): CHOL, HDL, LDLCALC, TRIG, CHOLHDL, LDLDIRECT in the last 72 hours. Thyroid Function Tests: No results for input(s):  TSH, T4TOTAL, FREET4, T3FREE, THYROIDAB in the last 72 hours. Anemia Panel: No results for input(s): VITAMINB12, FOLATE, FERRITIN, TIBC, IRON, RETICCTPCT in the last 72 hours. Urine analysis:    Component Value Date/Time   COLORURINE COLORLESS (A) 05/14/2019 1331   APPEARANCEUR CLEAR 05/14/2019 1331   LABSPEC 1.018 05/14/2019 1331   LABSPEC 1.020 05/04/2016 1006   PHURINE 5.0 05/14/2019 1331   GLUCOSEU NEGATIVE 05/14/2019 1331   GLUCOSEU Negative 05/04/2016 1006   HGBUR NEGATIVE 05/14/2019 1331   BILIRUBINUR NEGATIVE 05/14/2019  [place] Negative 05/04/2016 1006   [place] 05/14/2019 1331   PROTEINUR NEGATIVE 05/14/2019 1331   UROBILINOGEN 0.2 05/04/2016 1006   NITRITE NEGATIVE 05/14/2019 1331   LEUKOCYTESUR NEGATIVE 05/14/2019 1331   LEUKOCYTESUR Small 05/04/2016 1006   Sepsis Labs: @LABRCNTIP (procalcitonin:4,lacticidven:4) ) Recent Results (from the past 240 hour(s))  Blood culture (routine x 2)     Status: None   Collection Time: 03/04/21 12:07 PM   Specimen: BLOOD  Result Value Ref Range Status   Specimen Description BLOOD LEFT ANTECUBITAL  Final   Special Requests   Final    BOTTLES DRAWN AEROBIC AND ANAEROBIC Blood Culture adequate volume   Culture   Final    NO GROWTH 5 DAYS Performed at [facility], [place] [address] [place], [place] [address]    Report Status 03/09/2021 FINAL  Final  Blood culture (routine x 2)     Status: None   Collection Time: 03/04/21  4:09 PM   Specimen: BLOOD  Result Value Ref Range Status   Specimen Description BLOOD SITE NOT SPECIFIED  Final   Special Requests   Final    BOTTLES DRAWN AEROBIC AND ANAEROBIC Blood Culture results may not be optimal due to an inadequate volume of blood received in culture bottles   Culture   Final    NO GROWTH 5 DAYS Performed at [facility], [place] [address] [place], [place] [address]    Report Status 03/09/2021 FINAL  Final  Resp Panel by RT-PCR (Flu A&B, Covid)  Nasopharyngeal Swab     Status: None   Collection Time: 03/04/21  5:24 PM   Specimen: Nasopharyngeal Swab; Nasopharyngeal(NP) swabs in vial transport medium  Result Value Ref Range Status   SARS Coronavirus 2 by RT PCR NEGATIVE NEGATIVE Final    Comment: (NOTE) SARS-CoV-2 target nucleic acids are NOT DETECTED.  The SARS-CoV-2 RNA is generally detectable in upper respiratory specimens during the acute phase of infection. The lowest concentration of SARS-CoV-2 viral copies this assay can detect is 138 copies/mL. A negative result does not preclude SARS-Cov-2 infection and should not be used as the sole basis for treatment or other patient management decisions. A negative result may occur with  improper specimen collection/handling, submission of specimen other than nasopharyngeal swab, presence of viral mutation(s) within the areas targeted by this assay, and inadequate number of viral copies(<138 copies/mL). A negative result must be combined with clinical observations, patient history, and epidemiological information. The expected result is Negative.  Fact Sheet for Patients:  EntrepreneurPulse.com.au  Fact Sheet for Healthcare Providers:  IncredibleEmployment.be  This test is no t yet approved or cleared by the [place] FDA and  has been authorized for detection and/or diagnosis of SARS-CoV-2 by FDA under an Emergency Use Authorization (EUA). This EUA will remain  in effect (meaning this test can be used) for the duration of the COVID-19 declaration under Section 564(b)(1) of the Act, 21 U.S.C.section 360bbb-3(b)(1), unless the authorization is terminated  or revoked sooner.       Influenza A by PCR NEGATIVE NEGATIVE Final   Influenza B by PCR NEGATIVE NEGATIVE Final    Comment: (NOTE) The Xpert Xpress SARS-CoV-2/FLU/RSV plus assay is intended as an aid in the diagnosis of influenza from Nasopharyngeal swab specimens and should not be  used as a sole basis for treatment. Nasal washings and aspirates are unacceptable for Xpert Xpress SARS-CoV-2/FLU/RSV testing.  Fact Sheet for Patients: EntrepreneurPulse.com.au  Fact Sheet for Healthcare Providers: IncredibleEmployment.be  This test is not yet approved or cleared by the [place] and has been authorized for detection and/or diagnosis of SARS-CoV-2 by FDA under an Emergency Use Authorization (EUA). This EUA will remain in effect (meaning this test can be used) for the duration of the COVID-19 declaration under Section 564(b)(1) of the Act, 21 U.S.C. section 360bbb-3(b)(1), unless the authorization is terminated or revoked.  Performed at [facility], [place] [address], [place], [place] [address]   Aerobic/Anaerobic Culture w Gram Stain (surgical/deep wound)     Status: None (Preliminary result)   Collection Time: 03/07/21 11:53 AM   Specimen: Abscess  Result Value Ref Range Status   Specimen Description ABSCESS  Final   Special Requests DRAIN  Final   Gram Stain   Final    ABUNDANT WBC PRESENT,BOTH PMN AND MONONUCLEAR RARE GRAM NEGATIVE RODS Performed at [facility], [address] [place], [place] [address]    Culture   Final    ABUNDANT PROTEUS MIRABILIS NO ANAEROBES ISOLATED; CULTURE IN PROGRESS FOR 5 DAYS    Report Status PENDING  Incomplete   Organism ID, Bacteria PROTEUS MIRABILIS  Final      Susceptibility   Proteus mirabilis - MIC*    AMPICILLIN <=2 SENSITIVE Sensitive     CEFAZOLIN <=4 SENSITIVE Sensitive     CEFEPIME <=0.12 SENSITIVE Sensitive     CEFTAZIDIME <=1 SENSITIVE Sensitive     CEFTRIAXONE <=0.25 SENSITIVE Sensitive     CIPROFLOXACIN <=0.25 SENSITIVE Sensitive     GENTAMICIN <=1 SENSITIVE Sensitive     IMIPENEM 2 SENSITIVE Sensitive     TRIMETH/SULFA <=20 SENSITIVE Sensitive     AMPICILLIN/SULBACTAM <=2 SENSITIVE Sensitive     PIP/TAZO <=4 SENSITIVE Sensitive     * ABUNDANT  PROTEUS MIRABILIS  Surgical PCR screen     Status: None   Collection Time: 03/08/21  9:32 AM   Specimen: Nasal Mucosa; Nasal Swab  Result Value Ref Range Status   MRSA, PCR NEGATIVE NEGATIVE Final   Staphylococcus aureus NEGATIVE NEGATIVE Final    Comment: (NOTE) The Xpert SA Assay (FDA approved for NASAL specimens in patients 21 years of age and older), is one component of a comprehensive surveillance program. It is not intended to diagnose infection nor to guide or monitor treatment. Performed at [facility], [place] [address] [place], [place] [address]   Aerobic/Anaerobic Culture w Gram Stain (surgical/deep wound)     Status: None (Preliminary result)   Collection Time: 03/08/21 12:14 PM   Specimen: Other Source; Body Fluid  Result Value Ref Range Status   Specimen Description ABSCESS LEFT HIP  Final   Special Requests LEFT HIP  Final   Gram Stain PENDING  Incomplete   Culture   Final    NO GROWTH < 24 HOURS Performed at [facility], [place] [address], [place], [place] [address]    Report Status PENDING  Incomplete         Radiology Studies: DG Pelvis Portable  Result Date: 03/08/2021 CLINICAL DATA:  86 year old female status post left hip incision and drainage EXAM: PORTABLE PELVIS 1-2 VIEWS COMPARISON:  02/05/2021 FINDINGS: Visualized bony pelvis without acute displaced fracture. Osteopenia. Surgical changes of the right hip again noted with antegrade intramedullary rod and cannulated interlocking screw. Plate and screw fixation proximal right femur incompletely imaged. Similar sclerotic appearance at the femoral neck and proximal right femur. Redemonstration of early surgical changes of left hip arthroplasty. No erosive changes  or sclerotic changes of the proximal left femur. Tiny rounded lucencies within the soft tissues lateral to the greater trochanter, potentially secondary to recent surgery. IMPRESSION: Similar appearance of postsurgical changes of left hip  arthroplasty with no bony erosive changes or sclerotic changes. Tiny lucencies within the soft tissues lateral to the greater trochanter may be related to the patient's recent surgery. Unchanged appearance of chronic surgical changes of the right proximal femur/femoral neck. Electronically Signed   By: Corrie Mckusick D.O.   On: 03/08/2021 15:16   [place] EKG SITE RITE  Result Date: 03/09/2021 If Site Rite image not attached, placement could not be confirmed due to current cardiac rhythm.     Scheduled Meds:  allopurinol  300 mg Oral Daily   aspirin EC  81 mg Oral BID WC   citalopram  10 mg Oral Daily   docusate sodium  100 mg Oral BID   levothyroxine  125 mcg Oral QAC breakfast   metoCLOPramide  5 mg Oral BID AC & HS   mupirocin ointment  1 application Nasal BID   oxyCODONE  5 mg Oral Q4H while awake   pantoprazole  40 mg Oral BID AC   senna  1 tablet Oral BID   Continuous Infusions:  piperacillin-tazobactam (ZOSYN)  IV 3.375 g (03/09/21 1433)   [START ON 03/10/2021] vancomycin       LOS: 5 days      Debbe Odea, MD [facility] Pager: [email or web address] 03/09/2021, 5:11 PM

## 2021-03-10 DIAGNOSIS — L03114 Cellulitis of left upper limb: Secondary | ICD-10-CM | POA: Diagnosis not present

## 2021-03-10 DIAGNOSIS — L02416 Cutaneous abscess of left lower limb: Secondary | ICD-10-CM | POA: Diagnosis not present

## 2021-03-10 DIAGNOSIS — I5032 Chronic diastolic (congestive) heart failure: Secondary | ICD-10-CM | POA: Diagnosis not present

## 2021-03-10 DIAGNOSIS — F32A Depression, unspecified: Secondary | ICD-10-CM | POA: Diagnosis not present

## 2021-03-10 LAB — CBC
HCT: 32.9 % — ABNORMAL LOW (ref 36.0–46.0)
Hemoglobin: 9.9 g/dL — ABNORMAL LOW (ref 12.0–15.0)
MCH: 30.6 pg (ref 26.0–34.0)
MCHC: 30.1 g/dL (ref 30.0–36.0)
MCV: 101.5 fL — ABNORMAL HIGH (ref 80.0–100.0)
Platelets: 382 10*3/uL (ref 150–400)
RBC: 3.24 MIL/uL — ABNORMAL LOW (ref 3.87–5.11)
RDW: 14.4 % (ref 11.5–15.5)
WBC: 11.4 10*3/uL — ABNORMAL HIGH (ref 4.0–10.5)
nRBC: 0 % (ref 0.0–0.2)

## 2021-03-10 LAB — BASIC METABOLIC PANEL
Anion gap: 6 (ref 5–15)
BUN: 15 mg/dL (ref 8–23)
CO2: 29 mmol/L (ref 22–32)
Calcium: 9.4 mg/dL (ref 8.9–10.3)
Chloride: 101 mmol/L (ref 98–111)
Creatinine, Ser: 0.88 mg/dL (ref 0.44–1.00)
GFR, Estimated: >60 mL/min (ref >60)
Glucose, Bld: 173 mg/dL — ABNORMAL HIGH (ref 70–99)
Potassium: 4 mmol/L (ref 3.5–5.1)
Sodium: 136 mmol/L (ref 135–145)

## 2021-03-10 MED ORDER — CEFAZOLIN SODIUM-DEXTROSE 2-4 GM/100ML-% IV SOLN
2.0000 g | Freq: Three times a day (TID) | INTRAVENOUS | Status: DC
  Administered 2021-03-10 – 2021-03-13 (×9): 2 g via INTRAVENOUS
  Filled 2021-03-10 (×12): qty 100

## 2021-03-10 NOTE — Progress Notes (Signed)
PROGRESS NOTE    Melinda Kerr   WCB:[id number]  DOB: [birth year]  DOA: 03/04/2021 PCP: Maury Dus, MD   Brief Narrative:  Melinda Kerr is an 86 year old female with a history of hypothyroidism, essential hypertension and dyslipidemia who presents to the hospital for left hip pain which has increased since her hemiarthroplasty on 02/05/2021.  She is taken 2 courses of antibiotics for cellulitis as an outpatient.  She is admitted to the hospital for IV antibiotics   Subjective: She has no complaints today.    Assessment & Plan:   Principal Problem: Cellulitis and abscess of left thigh post op-  s/p left hemiarthoplasthy 5/11 -WBC ~ 13 -6/10- - IR placed a CT-guided 10 French catheter- 170 cc of purulent fluid drained - 6/11> went to OR for I and D by Dr Lyla Glassing- has wound vac now - culture growing abundant proteus Mirabilis - she has been on Vanco and Zosyn-  dc Vanc  6/12- changing Zosyn to Ancef today based on sensitivities - ID consulted by ortho - blood cultures negative - WBC improving, erythema resolved, induration improved    Active Problems: Gout - Continue allopurinol    Hypothyroidism -Continue Synthroid    Essential hypertension   Chronic diastolic CHF (congestive heart failure) (HCC) -Hold Lasix and Aldactone for now as creatinine was rising -following creatinine  Normocytic anemia - Hemoglobin ranges from 11-12  Time spent in minutes: 35 DVT prophylaxis: SCDs Start: 03/08/21 1425 Code Status: DO NOT RESUSCITATE Family Communication:  Level of Care: Level of care: Med-Surg Disposition Plan:  Status is: Inpatient  Remains inpatient appropriate because:IV treatments appropriate due to intensity of illness or inability to take PO  Dispo: The patient is from: SNF              Anticipated d/c is to: SNF              Patient currently is not medically stable to d/c.   Difficult to place patient No      Consultants:  Orthopedic  surgery IR ID Procedures:  IR -CT-guided drain placement Antimicrobials:  Anti-infectives (From admission, onward)    Start     Dose/Rate Route Frequency Ordered Stop   03/10/21 1500  vancomycin (VANCOREADY) IVPB 1250 mg/250 mL  Status:  Discontinued        1,250 mg 166.7 mL/hr over 90 Minutes Intravenous Every 24 hours 03/09/21 1500 03/09/21 1718   03/10/21 0915  ceFAZolin (ANCEF) IVPB 1 g/50 mL premix       Note to Pharmacy: Pharmacy may adjust dose for treatment of left groin abscess   1 g 100 mL/hr over 30 Minutes Intravenous Every 8 hours 03/10/21 0820     03/08/21 1213  tobramycin (NEBCIN) powder  Status:  Discontinued          As needed 03/08/21 1213 03/08/21 1304   03/08/21 0945  ceFAZolin (ANCEF) IVPB 2g/100 mL premix        2 g 200 mL/hr over 30 Minutes Intravenous To [facility] 03/08/21 0445 03/08/21 1155   03/05/21 1500  vancomycin (VANCOREADY) IVPB 1000 mg/200 mL  Status:  Discontinued        1,000 mg 200 mL/hr over 60 Minutes Intravenous Every 24 hours 03/04/21 1505 03/09/21 1500   03/04/21 1600  piperacillin-tazobactam (ZOSYN) IVPB 3.375 g  Status:  Discontinued        3.375 g 12.5 mL/hr over 240 Minutes Intravenous Every 8 hours 03/04/21 1546 03/10/21 0820   03/04/21  1445  vancomycin (VANCOCIN) 2,250 mg in sodium chloride 0.9 % 500 mL IVPB        2,250 mg 250 mL/hr over 120 Minutes Intravenous  Once 03/04/21 1437 03/04/21 1741        Objective: Vitals:   03/09/21 1951 03/10/21 0500 03/10/21 0524 03/10/21 0734  BP: (!) 107/53  125/61 (!) 106/59  Pulse: 64  62 60  Resp: 18  18 16   Temp: 98.8 F (37.1 C)  98.6 F (37 C) 98.3 F (36.8 C)  TempSrc: Oral  Oral Oral  SpO2: 98%  100% 100%  Weight:  115.4 kg    Height:        Intake/Output Summary (Last 24 hours) at 03/10/2021 0820 Last data filed at 03/10/2021 0550 Gross per 24 hour  Intake 250 ml  Output 800 ml  Net -550 ml    Filed Weights   03/08/21 0500 03/09/21 0500 03/10/21 0500   Weight: 109.7 kg 111.6 kg 115.4 kg    Examination: General exam: Appears comfortable  HEENT: PERRLA, oral mucosa moist, no sclera icterus or thrush Respiratory system: Clear to auscultation. Respiratory effort normal. Cardiovascular system: S1 & S2 heard, regular rate and rhythm Gastrointestinal system: Abdomen soft, non-tender, nondistended. Normal bowel sounds   Central nervous system: Alert and oriented. No focal neurological deficits. Extremities: No cyanosis, clubbing or edema- left upper thigh drain with pink tinged fluid Skin: No rashes or ulcers Psychiatry:  Mood & affect appropriate.      Data Reviewed: I have personally reviewed following labs and imaging studies  CBC: Recent Labs  Lab 03/04/21 1218 03/05/21 0326 03/06/21 0150 03/07/21 0438 03/09/21 0335 03/10/21 0339  WBC 9.9 13.1* 13.2* 13.8* 13.2* 11.4*  NEUTROABS 7.6  --   --   --   --   --   HGB 11.5* 12.8 11.9* 11.1* 10.1* 9.9*  HCT 36.8 41.3 37.7 35.1* 32.7* 32.9*  MCV 100.0 100.0 97.7 97.8 98.8 101.5*  PLT 503* 503* 483* 462* 432* 235    Basic Metabolic Panel: Recent Labs  Lab 03/05/21 0755 03/06/21 0150 03/07/21 0438 03/09/21 0335 03/10/21 0339  NA 138 137 136 135 136  K 4.2 4.0 3.7 3.9 4.0  CL 100 101 101 100 101  CO2 27 29 27 30 29   GLUCOSE 126* 150* 145* 207* 173*  BUN 7* 5* 8 9 15   CREATININE 0.64 0.78 0.93 0.75 0.88  CALCIUM 9.5 9.4 9.0 9.2 9.4    GFR: Estimated Creatinine Clearance: 60.4 mL/min (by C-G formula based on SCr of 0.88 mg/dL). Liver Function Tests: Recent Labs  Lab 03/04/21 1218  AST 22  ALT 13  ALKPHOS 138*  BILITOT 0.5  PROT 5.2*  ALBUMIN 2.4*    No results for input(s): LIPASE, AMYLASE in the last 168 hours. No results for input(s): AMMONIA in the last 168 hours. Coagulation Profile: No results for input(s): INR, PROTIME in the last 168 hours. Cardiac Enzymes: No results for input(s): CKTOTAL, CKMB, CKMBINDEX, TROPONINI in the last 168 hours. BNP (last  3 results) No results for input(s): PROBNP in the last 8760 hours. HbA1C: No results for input(s): HGBA1C in the last 72 hours. CBG: No results for input(s): GLUCAP in the last 168 hours. Lipid Profile: No results for input(s): CHOL, HDL, LDLCALC, TRIG, CHOLHDL, LDLDIRECT in the last 72 hours. Thyroid Function Tests: No results for input(s): TSH, T4TOTAL, FREET4, T3FREE, THYROIDAB in the last 72 hours. Anemia Panel: No results for input(s): VITAMINB12, FOLATE, FERRITIN, TIBC, IRON, RETICCTPCT  in the last 72 hours. Urine analysis:    Component Value Date/Time   COLORURINE COLORLESS (A) 05/14/2019 1331   APPEARANCEUR CLEAR 05/14/2019 1331   LABSPEC 1.018 05/14/2019 1331   LABSPEC 1.020 05/04/2016 1006   PHURINE 5.0 05/14/2019 1331   GLUCOSEU NEGATIVE 05/14/2019 1331   GLUCOSEU Negative 05/04/2016 1006   HGBUR NEGATIVE 05/14/2019 1331   BILIRUBINUR NEGATIVE 05/14/2019 1331   BILIRUBINUR Negative 05/04/2016 1006   KETONESUR NEGATIVE 05/14/2019 1331   PROTEINUR NEGATIVE 05/14/2019 1331   UROBILINOGEN 0.2 05/04/2016 1006   NITRITE NEGATIVE 05/14/2019 1331   LEUKOCYTESUR NEGATIVE 05/14/2019 1331   LEUKOCYTESUR Small 05/04/2016 1006   Sepsis Labs: @LABRCNTIP (procalcitonin:4,lacticidven:4) ) Recent Results (from the past 240 hour(s))  Blood culture (routine x 2)     Status: None   Collection Time: 03/04/21 12:07 PM   Specimen: BLOOD  Result Value Ref Range Status   Specimen Description BLOOD LEFT ANTECUBITAL  Final   Special Requests   Final    BOTTLES DRAWN AEROBIC AND ANAEROBIC Blood Culture adequate volume   Culture   Final    NO GROWTH 5 DAYS Performed at [facility], [place] [address], [place], [place] [address]    Report Status 03/09/2021 FINAL  Final  Blood culture (routine x 2)     Status: None   Collection Time: 03/04/21  4:09 PM   Specimen: BLOOD  Result Value Ref Range Status   Specimen Description BLOOD SITE NOT SPECIFIED  Final   Special Requests   Final     BOTTLES DRAWN AEROBIC AND ANAEROBIC Blood Culture results may not be optimal due to an inadequate volume of blood received in culture bottles   Culture   Final    NO GROWTH 5 DAYS Performed at [facility], [place] [address], [place], [place] [address]    Report Status 03/09/2021 FINAL  Final  Resp Panel by RT-PCR (Flu A&B, Covid) Nasopharyngeal Swab     Status: None   Collection Time: 03/04/21  5:24 PM   Specimen: Nasopharyngeal Swab; Nasopharyngeal(NP) swabs in vial transport medium  Result Value Ref Range Status   SARS Coronavirus 2 by RT PCR NEGATIVE NEGATIVE Final    Comment: (NOTE) SARS-CoV-2 target nucleic acids are NOT DETECTED.  The SARS-CoV-2 RNA is generally detectable in upper respiratory specimens during the acute phase of infection. The lowest concentration of SARS-CoV-2 viral copies this assay can detect is 138 copies/mL. A negative result does not preclude SARS-Cov-2 infection and should not be used as the sole basis for treatment or other patient management decisions. A negative result may occur with  improper specimen collection/handling, submission of specimen other than nasopharyngeal swab, presence of viral mutation(s) within the areas targeted by this assay, and inadequate number of viral copies(<138 copies/mL). A negative result must be combined with clinical observations, patient history, and epidemiological information. The expected result is Negative.  Fact Sheet for Patients:  EntrepreneurPulse.com.au  Fact Sheet for Healthcare Providers:  IncredibleEmployment.be  This test is no t yet approved or cleared by the [place] FDA and  has been authorized for detection and/or diagnosis of SARS-CoV-2 by FDA under an Emergency Use Authorization (EUA). This EUA will remain  in effect (meaning this test can be used) for the duration of the COVID-19 declaration under Section 564(b)(1) of the Act, 21 U.S.C.section  360bbb-3(b)(1), unless the authorization is terminated  or revoked sooner.       Influenza A by PCR NEGATIVE NEGATIVE Final  Influenza B by PCR NEGATIVE NEGATIVE Final    Comment: (NOTE) The Xpert Xpress SARS-CoV-2/FLU/RSV plus assay is intended as an aid in the diagnosis of influenza from Nasopharyngeal swab specimens and should not be used as a sole basis for treatment. Nasal washings and aspirates are unacceptable for Xpert Xpress SARS-CoV-2/FLU/RSV testing.  Fact Sheet for Patients: EntrepreneurPulse.com.au  Fact Sheet for Healthcare Providers: IncredibleEmployment.be  This test is not yet approved or cleared by the [place] FDA and has been authorized for detection and/or diagnosis of SARS-CoV-2 by FDA under an Emergency Use Authorization (EUA). This EUA will remain in effect (meaning this test can be used) for the duration of the COVID-19 declaration under Section 564(b)(1) of the Act, 21 U.S.C. section 360bbb-3(b)(1), unless the authorization is terminated or revoked.  Performed at [facility], [place] [address], [place], [place] [address]   Aerobic/Anaerobic Culture w Gram Stain (surgical/deep wound)     Status: None (Preliminary result)   Collection Time: 03/07/21 11:53 AM   Specimen: Abscess  Result Value Ref Range Status   Specimen Description ABSCESS  Final   Special Requests DRAIN  Final   Gram Stain   Final    ABUNDANT WBC PRESENT,BOTH PMN AND MONONUCLEAR RARE GRAM NEGATIVE RODS Performed at [facility], [address] [place], [place] [address]    Culture   Final    ABUNDANT PROTEUS MIRABILIS NO ANAEROBES ISOLATED; CULTURE IN PROGRESS FOR 5 DAYS    Report Status PENDING  Incomplete   Organism ID, Bacteria PROTEUS MIRABILIS  Final      Susceptibility   Proteus mirabilis - MIC*    AMPICILLIN <=2 SENSITIVE Sensitive     CEFAZOLIN <=4 SENSITIVE Sensitive     CEFEPIME <=0.12 SENSITIVE Sensitive      CEFTAZIDIME <=1 SENSITIVE Sensitive     CEFTRIAXONE <=0.25 SENSITIVE Sensitive     CIPROFLOXACIN <=0.25 SENSITIVE Sensitive     GENTAMICIN <=1 SENSITIVE Sensitive     IMIPENEM 2 SENSITIVE Sensitive     TRIMETH/SULFA <=20 SENSITIVE Sensitive     AMPICILLIN/SULBACTAM <=2 SENSITIVE Sensitive     PIP/TAZO <=4 SENSITIVE Sensitive     * ABUNDANT PROTEUS MIRABILIS  Surgical PCR screen     Status: None   Collection Time: 03/08/21  9:32 AM   Specimen: Nasal Mucosa; Nasal Swab  Result Value Ref Range Status   MRSA, PCR NEGATIVE NEGATIVE Final   Staphylococcus aureus NEGATIVE NEGATIVE Final    Comment: (NOTE) The Xpert SA Assay (FDA approved for NASAL specimens in patients 80 years of age and older), is one component of a comprehensive surveillance program. It is not intended to diagnose infection nor to guide or monitor treatment. Performed at [facility], [place] [address] [place], [place] [address]   Aerobic/Anaerobic Culture w Gram Stain (surgical/deep wound)     Status: None (Preliminary result)   Collection Time: 03/08/21 12:14 PM   Specimen: Other Source; Body Fluid  Result Value Ref Range Status   Specimen Description ABSCESS LEFT HIP  Final   Special Requests LEFT HIP  Final   Gram Stain   Final    RARE WBC PRESENT,BOTH PMN AND MONONUCLEAR NO ORGANISMS SEEN    Culture   Final    NO GROWTH < 24 HOURS Performed at [facility], [place] [address] [place], [place] [address]    Report Status PENDING  Incomplete         Radiology Studies: DG Pelvis Portable  Result Date: 03/08/2021 CLINICAL DATA:  86 year old female status post left hip incision and drainage EXAM: PORTABLE PELVIS 1-2 VIEWS COMPARISON:  02/05/2021 FINDINGS: Visualized bony pelvis without acute displaced fracture. Osteopenia. Surgical changes of the right hip again noted with antegrade intramedullary rod and cannulated interlocking screw. Plate and screw fixation proximal right femur incompletely imaged.  Similar sclerotic appearance at the femoral neck and proximal right femur. Redemonstration of early surgical changes of left hip arthroplasty. No erosive changes or sclerotic changes of the proximal left femur. Tiny rounded lucencies within the soft tissues lateral to the greater trochanter, potentially secondary to recent surgery. IMPRESSION: Similar appearance of postsurgical changes of left hip arthroplasty with no bony erosive changes or sclerotic changes. Tiny lucencies within the soft tissues lateral to the greater trochanter may be related to the patient's recent surgery. Unchanged appearance of chronic surgical changes of the right proximal femur/femoral neck. Electronically Signed   By: Corrie Mckusick D.O.   On: 03/08/2021 15:16   [place] EKG SITE RITE  Result Date: 03/09/2021 If Site Rite image not attached, placement could not be confirmed due to current cardiac rhythm.     Scheduled Meds:  allopurinol  300 mg Oral Daily   aspirin EC  81 mg Oral BID WC   citalopram  10 mg Oral Daily   docusate sodium  100 mg Oral BID   levothyroxine  125 mcg Oral QAC breakfast   metoCLOPramide  5 mg Oral BID AC & HS   mupirocin ointment  1 application Nasal BID   oxyCODONE  5 mg Oral Q4H while awake   pantoprazole  40 mg Oral BID AC   senna  1 tablet Oral BID   Continuous Infusions:   ceFAZolin (ANCEF) IV       LOS: 6 days      Debbe Odea, MD [facility] Pager: [email or web address] 03/10/2021, 8:20 AM

## 2021-03-10 NOTE — Care Management Important Message (Signed)
Important Message  Patient Details  Name: Melinda Kerr MRN: [id number] Date of Birth: Jun 26, 1935   Medicare Important Message Given:  Yes     [place] P [name] 03/10/2021, 2:38 PM

## 2021-03-10 NOTE — Plan of Care (Signed)
  Problem: Pain Managment: Goal: General experience of comfort will improve Outcome: Progressing   Problem: Safety: Goal: Ability to remain free from injury will improve Outcome: Progressing   Problem: Skin Integrity: Goal: Risk for impaired skin integrity will decrease Outcome: Progressing   

## 2021-03-11 ENCOUNTER — Inpatient Hospital Stay: Payer: Self-pay

## 2021-03-11 DIAGNOSIS — I5032 Chronic diastolic (congestive) heart failure: Secondary | ICD-10-CM | POA: Diagnosis not present

## 2021-03-11 DIAGNOSIS — I10 Essential (primary) hypertension: Secondary | ICD-10-CM | POA: Diagnosis not present

## 2021-03-11 DIAGNOSIS — E038 Other specified hypothyroidism: Secondary | ICD-10-CM | POA: Diagnosis not present

## 2021-03-11 DIAGNOSIS — Z96642 Presence of left artificial hip joint: Secondary | ICD-10-CM

## 2021-03-11 DIAGNOSIS — M25552 Pain in left hip: Secondary | ICD-10-CM

## 2021-03-11 DIAGNOSIS — L02416 Cutaneous abscess of left lower limb: Secondary | ICD-10-CM | POA: Diagnosis not present

## 2021-03-11 MED ORDER — WHITE PETROLATUM EX OINT
TOPICAL_OINTMENT | CUTANEOUS | Status: AC
  Filled 2021-03-11: qty 28.35

## 2021-03-11 NOTE — Progress Notes (Signed)
    Subjective:  Patient reports pain as mild.  Denies N/V/CP/SOB. Patient resting and eating lunch  Objective:   VITALS:   Vitals:   03/10/21 1516 03/10/21 1943 03/11/21 0500 03/11/21 0837  BP: 126/60 106/61  125/64  Pulse: 62 63  (!) 59  Resp: 16 16  16   Temp: 98.3 F (36.8 C) 99.2 F (37.3 C)  98 F (36.7 C)  TempSrc: Oral   Oral  SpO2: 98% 97%  100%  Weight:   111.6 kg   Height:        NAD ABD soft Neurovascular intact Sensation intact distally Intact pulses distally Dorsiflexion/Plantar flexion intact Incision: dressing C/D/I and incisional vac in place and functioning correctly JP drain in place   Lab Results  Component Value Date   WBC 11.4 (H) 03/10/2021   HGB 9.9 (L) 03/10/2021   HCT 32.9 (L) 03/10/2021   MCV 101.5 (H) 03/10/2021   PLT 382 03/10/2021   BMET    Component Value Date/Time   NA 136 03/10/2021 0339   NA 138 03/01/2017 1325   K 4.0 03/10/2021 0339   K 4.2 03/01/2017 1325   CL 101 03/10/2021 0339   CO2 29 03/10/2021 0339   CO2 27 03/01/2017 1325   GLUCOSE 173 (H) 03/10/2021 0339   GLUCOSE 135 03/01/2017 1325   BUN 15 03/10/2021 0339   BUN 22.5 03/01/2017 1325   CREATININE 0.88 03/10/2021 0339   CREATININE 0.9 03/01/2017 1325   CALCIUM 9.4 03/10/2021 0339   CALCIUM 10.1 10/29/2018 1007   CALCIUM 10.4 03/01/2017 1325   GFRNONAA >60 03/10/2021 0339   GFRAA >60 05/11/2020 0429     Assessment/Plan: 3 Days Post-Op   Principal Problem:   Abscess of hip, left Active Problems:   OSA on CPAP   Hypothyroidism   Dyslipidemia   Depression   Essential hypertension   Cellulitis   Chronic diastolic CHF (congestive heart failure) (HCC)   WBAT with walker DVT ppx: Aspirin, SCDs, TEDS PO pain control PT/OT Disconnect house vac from prevena dressing at discharge and change to portable vacuum unit. JP drain to stay in place until follow up appointment with orthopedics PICC line ordered.  ID following. Appreciate abx recommendations.   IV Ancef 2g q8. Plan to update abx depending on cultures Follow up with Dr.Swinteck's clinic one week post discharge for incisional vacuum dressing removal and for JP drain removal Dispo: Pending     Dorothyann Peng 03/11/2021, 12:28 PM  [facility] is now [facility] [address] [place], [place],  [address] Phone: [phone] [email or web address]  [employer]

## 2021-03-11 NOTE — Plan of Care (Signed)

## 2021-03-11 NOTE — Progress Notes (Signed)
Physical Therapy Treatment Patient Details Name: Melinda Kerr MRN: [id number] DOB: Feb 26, 1935 Today's Date: 03/11/2021    History of Present Illness 86 y.o. female presents to [facility] ED on 03/04/2021 with L hip pain and redness after recent L hip hemiarthroplasty on 02/05/2021. Pt underwent US and CT guided catheter placement to drain left anterior-lateral thigh abscess on 03/07/2021. PMH includes chronic diastolic congestive heart failure, essential hypertension, CAD, history of CVA, OSA on CPAP, HLD, endometrial cancer, Hx DVT.    PT Comments    Pt declines any bed mobility tasks, states she isn't "doing that well", but is agreeable to bed-level exercises. Pt performed exercises well with min-mod PT assist, but overall has very little tolerance. PT continuing to recommend SNF level of care post-acutely.    Follow Up Recommendations  SNF     Equipment Recommendations  None recommended by PT    Recommendations for Other Services       Precautions / Restrictions Precautions Precautions: Fall Precaution Comments: L hip drain Restrictions Weight Bearing Restrictions: No LLE Weight Bearing: Weight bearing as tolerated    Mobility  Bed Mobility               General bed mobility comments: pt declines    Transfers                 General transfer comment: pt declines  Ambulation/Gait                 Stairs             Wheelchair Mobility    Modified Rankin (Stroke Patients Only)       Balance                                            Cognition Arousal/Alertness: Awake/alert Behavior During Therapy: WFL for tasks assessed/performed Overall Cognitive Status: No family/caregiver present to determine baseline cognitive functioning                                 General Comments: follows commands, answers questions appropriately; oriented x4. Flat affect and trembling throughout session, suspect anxiety       Exercises General Exercises - Upper Extremity Shoulder Flexion: AAROM;Both;15 reps;Supine Elbow Flexion: AAROM;Both;10 reps;Supine Elbow Extension: AAROM;Both;10 reps;Supine General Exercises - Lower Extremity Ankle Circles/Pumps: AROM;Both;20 reps;Supine Quad Sets: Both;10 reps;Supine;AAROM Heel Slides: AAROM;Left;10 reps;Supine Hip ABduction/ADduction: AAROM;Left;10 reps;Supine    General Comments        Pertinent Vitals/Pain Pain Assessment: Faces Faces Pain Scale: Hurts even more Pain Location: L hip Pain Descriptors / Indicators: Aching;Sore;Discomfort;Grimacing Pain Intervention(s): Limited activity within patient's tolerance;Monitored during session;Repositioned    Home Living                      Prior Function            PT Goals (current goals can now be found in the care plan section) Acute Rehab PT Goals Patient Stated Goal: walk in those bars at [employer] PT Goal Formulation: With patient Time For Goal Achievement: 03/21/21 Potential to Achieve Goals: Poor Progress towards PT goals: Progressing toward goals    Frequency    Min 2X/week      PT Plan Current plan remains appropriate    Co-evaluation  AM-PAC PT "6 Clicks" Mobility   Outcome Measure  Help needed turning from your back to your side while in a flat bed without using bedrails?: Total Help needed moving from lying on your back to sitting on the side of a flat bed without using bedrails?: Total Help needed moving to and from a bed to a chair (including a wheelchair)?: Total Help needed standing up from a chair using your arms (e.g., wheelchair or bedside chair)?: Total Help needed to walk in hospital room?: Total Help needed climbing 3-5 steps with a railing? : Total 6 Click Score: 6    End of Session   Activity Tolerance: Patient limited by pain;Other (comment) (anxiety about mobility) Patient left: in bed;with call bell/phone within reach;with bed alarm  set Nurse Communication: Mobility status PT Visit Diagnosis: Other abnormalities of gait and mobility (R26.89);Pain Pain - Right/Left: Left Pain - part of body: Hip     Time: 3953-2023 PT Time Calculation (min) (ACUTE ONLY): 18 min  Charges:  $Therapeutic Exercise: 8-22 mins                     Stacie Glaze, PT DPT Acute Rehabilitation Services Pager [phone]  Office [phone]    Roxine Caddy E Ruffin Pyo 03/11/2021, 4:44 PM

## 2021-03-11 NOTE — Consult Note (Addendum)
[facility]    Date of Admission:  03/04/2021     Reason for Consult: Abscess of left hip     Referring Physician: Orthopedic surgery  Current antibiotics: Cefazolin 6/13--present  Previous antibiotics: Vancomycin 6/7--6/12 Piperacillin tazobactam 6/7--6/12  ASSESSMENT:    Left hip abscess and recent hip arthroplasty (02/05/21): Abscess occurred over the site of prior skin breakdown from adhesive tape (not at site of prior incision).  Status post IR guided drain placement 6/10 with cultures yielding Proteus mirabilis.  Subsequently had formal incision and drainage 03/08/2021 with cultures no growth to date.  I&D was carried out through original incision site to reach the area of abscess with placement of wound vac and JP drain.  She is currently on cefazolin and doing well.  Abscess appears to have been contained to the skin and subcutaneous tissue.  Based on imaging, the fluid collection did not appear to communicate with the left hip joint space and the fascial suture line is intact.   PLAN:    Continue cefazolin 2 g IV every 8 hours Follow-up pending cultures Discussed with orthopedics and will plan for PICC line placement and 3-4 weeks of IV antibiotics. Will follow    Principal Problem:   Abscess of hip, left Active Problems:   OSA on CPAP   Hypothyroidism   Dyslipidemia   Depression   Essential hypertension   Cellulitis   Chronic diastolic CHF (congestive heart failure) (HCC)   MEDICATIONS:    Scheduled Meds:  allopurinol  300 mg Oral Daily   aspirin EC  81 mg Oral BID WC   citalopram  10 mg Oral Daily   docusate sodium  100 mg Oral BID   levothyroxine  125 mcg Oral QAC breakfast   metoCLOPramide  5 mg Oral BID AC & HS   mupirocin ointment  1 application Nasal BID   oxyCODONE  5 mg Oral Q4H while awake   pantoprazole  40 mg Oral BID AC   senna  1 tablet Oral BID   Continuous Infusions:   ceFAZolin (ANCEF) IV 2 g (03/11/21 0331)    PRN Meds:.baclofen, hydrALAZINE, hydrOXYzine, metoCLOPramide **OR** metoCLOPramide (REGLAN) injection, ondansetron **OR** ondansetron (ZOFRAN) IV, oxyCODONE, oxyCODONE, polyethylene glycol  HPI:    Melinda Kerr is a 86 y.o. female with a past medical history of hypothyroidism, hypertension, dyslipidemia, and recent left hip hemiarthroplasty (02/05/2021) who presented with left hip swelling and erythema.  Following her initial surgery on 5/11 she developed 2 small areas of delayed wound healing and developed skin breakdown on the lateral thigh from the use of adhesive tape to secure the dressing.  She later developed cellulitis in the site of breakdown which did not respond to oral antibiotics.  She then developed fluctuance over the same site.  She was subsequently admitted for IV antibiotics on 6/7.  She underwent IR CT-guided drain placement on 6/10 with 170 cc of purulent fluid drained.  Cultures from this procedure yielded a pansensitive Proteus mirabilis.  The following day on 6/11 she went to the OR for incision and drainage with Dr. Lyla Glassing with placement of a wound VAC.  Her OR cultures are currently no growth and peripheral blood cultures were also negative.  Upon admission she had a white count of 13.1 which is improved to 11.4 when most recently checked yesterday.  Based on the results of her cultures her antibiotics have been narrowed to cefazolin and we have been consulted for further antibiotic recommendations.  In review of her operative note she underwent excisional debridement of skin and subcutaneous tissue of the left hip with application of the negative pressure incisional dressing.  CT scan of her left hip from 6/7 showed a lobulated fluid and air-containing collection within the subcutaneous soft tissues overlying the anterior lateral aspect of the left hip and proximal thigh measuring approximately 11 x 8 x 5 cm.  Her left hip hemiarthroplasty was without evidence of  periprosthetic fracture or loosening and there was no periprosthetic fluid collection.  The fluid collection that was described did not appear to communicate with the left hip joint space.   Past Medical History:  Diagnosis Date   Arthritis    "shoulders, knees, lower back" (07/14/2017)   CHF (congestive heart failure) ([place])    "while in hospital w/hysterectomy in 2017"   Chronic lower back pain    Depression    Diverticulitis of large intestine with abscess 07/13/2017   DVT (deep venous thrombosis) (HCC)    Dyspnea    "cause I'm over weight"   Esophageal reflux    Gout    Hyperlipidemia    under control   Hypertension    Hypothyroidism    Morbid obesity with BMI of 40.0-44.9, adult ([place])    Nocturia    OSA on CPAP    "not wearing it when I sleep in my lift chair" (07/14/2017)   Osteoarthritis    Phlebitis    hx of   Pneumonia ?1989   Spondylosis    with scoliosis   Type II diabetes mellitus (HCC)    borderline , diet controlled    Urinary frequency    Uterine cancer (HCC)    S/P chemo, radiation, hysterectomy    Social History   Tobacco Use   Smoking status: Never   Smokeless tobacco: Never  Vaping Use   Vaping Use: Never used  Substance Use Topics   Alcohol use: No   Drug use: No    Family History  Problem Relation Age of Onset   Heart disease Mother    Heart disease Father    CVA Father     Allergies  Allergen Reactions   Oxybutynin Other (See Comments)    Bleeding gums, felt sick    Review of Systems  Constitutional:  Negative for chills and fever.  Respiratory: Negative.    Cardiovascular: Negative.   Gastrointestinal: Negative.   Genitourinary: Negative.   Musculoskeletal:  Positive for joint pain.  Skin: Negative.   All other systems reviewed and are negative.  OBJECTIVE:   Blood pressure 125/64, pulse (!) 59, temperature 98 F (36.7 C), temperature source Oral, resp. rate 16, height 5\' 5"  (1.651 m), weight 111.6 kg, SpO2 100 %. Body  mass index is 40.94 kg/m.  Physical Exam Constitutional:      General: She is not in acute distress.    Appearance: Normal appearance.  Cardiovascular:     Rate and Rhythm: Normal rate and regular rhythm.     Pulses: Normal pulses.  Pulmonary:     Effort: Pulmonary effort is normal. No respiratory distress.     Breath sounds: Normal breath sounds.  Abdominal:     General: There is no distension.     Palpations: Abdomen is soft.     Tenderness: There is no abdominal tenderness.  Musculoskeletal:        General: No tenderness.     Comments: Left hip wound vac in place along with surgical drain.   Skin:  General: Skin is warm and dry.     Findings: Bruising present. No rash.  Neurological:     General: No focal deficit present.     Mental Status: She is alert and oriented to person, place, and time.  Psychiatric:        Mood and Affect: Mood normal.        Behavior: Behavior normal.     Lab Results: Lab Results  Component Value Date   WBC 11.4 (H) 03/10/2021   HGB 9.9 (L) 03/10/2021   HCT 32.9 (L) 03/10/2021   MCV 101.5 (H) 03/10/2021   PLT 382 03/10/2021    Lab Results  Component Value Date   NA 136 03/10/2021   K 4.0 03/10/2021   CO2 29 03/10/2021   GLUCOSE 173 (H) 03/10/2021   BUN 15 03/10/2021   CREATININE 0.88 03/10/2021   CALCIUM 9.4 03/10/2021   GFRNONAA >60 03/10/2021   GFRAA >60 05/11/2020    Lab Results  Component Value Date   ALT 13 03/04/2021   AST 22 03/04/2021   ALKPHOS 138 (H) 03/04/2021   BILITOT 0.5 03/04/2021    No results found for: CRP  No results found for: ESRSEDRATE  I have reviewed the micro and lab results in Epic.  Imaging: No results found.   Imaging independently reviewed in Epic.  [facility]ious [facility] [phone] pager 03/11/2021, 9:57 AM

## 2021-03-11 NOTE — Progress Notes (Addendum)
PROGRESS NOTE    Melinda Kerr   MVE:[id number]  DOB: 21-Sep-1935  DOA: 03/04/2021 PCP: Maury Dus, MD   Brief Narrative:  Melinda Kerr is an 86 year old female with a history of hypothyroidism, essential hypertension and dyslipidemia who presents to the hospital for left hip pain which has increased since her hemiarthroplasty on 02/05/2021.  She is taken 2 courses of antibiotics for cellulitis as an outpatient.  She is admitted to the hospital for IV antibiotics   Subjective: She has no complaints.     Assessment & Plan:   Principal Problem: Cellulitis and abscess of left thigh post op-  s/p left hemiarthoplasthy 5/11 -WBC ~ 13 -6/10- - IR placed a CT-guided 10 French catheter- 170 cc of purulent fluid drained - 6/11> went to OR for I and D by Dr Lyla Glassing- has wound vac now - culture growing abundant proteus Mirabilis - she has been on Vanco and Zosyn-  dc Vanc  6/12 - 6/13> changed Zosyn to Ancef  based on sensitivities  -  ID consulted by ortho- they are recommending a PICC and IV antibiotics - I have placed order for PICC and notified SW (who will notify SNF) - blood cultures negative - WBC improving, erythema resolved, induration improved    Active Problems: Gout - Continue allopurinol    Hypothyroidism -Continue Synthroid    Essential hypertension   Chronic diastolic CHF (congestive heart failure) (HCC) -Hold Lasix and Aldactone for now as creatinine was rising -following creatinine, daily weights, I and O - BP stable  Normocytic anemia - Hemoglobin ranges from 11-12  Time spent in minutes: 35- > 50% of time taken in coordination of care with ortho, ID and SW DVT prophylaxis: SCDs Start: 03/08/21 1425 Code Status: DO NOT RESUSCITATE Family Communication:  Level of Care: Level of care: Med-Surg Disposition Plan:  Status is: Inpatient  Remains inpatient appropriate because:IV treatments appropriate due to intensity of illness or inability to take  PO  Dispo: The patient is from: SNF              Anticipated d/c is to: SNF              Patient currently is not medically stable to d/c.   Difficult to place patient No Consultants:  Orthopedic surgery IR ID Procedures:  IR -CT-guided drain placement Antimicrobials:  Anti-infectives (From admission, onward)    Start     Dose/Rate Route Frequency Ordered Stop   03/10/21 1500  vancomycin (VANCOREADY) IVPB 1250 mg/250 mL  Status:  Discontinued        1,250 mg 166.7 mL/hr over 90 Minutes Intravenous Every 24 hours 03/09/21 1500 03/09/21 1718   03/10/21 0915  ceFAZolin (ANCEF) IVPB 2g/100 mL premix       Note to Pharmacy: Pharmacy may adjust dose for treatment of left groin abscess   2 g 200 mL/hr over 30 Minutes Intravenous Every 8 hours 03/10/21 0820     03/08/21 1213  tobramycin (NEBCIN) powder  Status:  Discontinued          As needed 03/08/21 1213 03/08/21 1304   03/08/21 0945  ceFAZolin (ANCEF) IVPB 2g/100 mL premix        2 g 200 mL/hr over 30 Minutes Intravenous To [facility] 03/08/21 0445 03/08/21 1155   03/05/21 1500  vancomycin (VANCOREADY) IVPB 1000 mg/200 mL  Status:  Discontinued        1,000 mg 200 mL/hr over 60 Minutes Intravenous Every 24 hours 03/04/21  1505 03/09/21 1500   03/04/21 1600  piperacillin-tazobactam (ZOSYN) IVPB 3.375 g  Status:  Discontinued        3.375 g 12.5 mL/hr over 240 Minutes Intravenous Every 8 hours 03/04/21 1546 03/10/21 0820   03/04/21 1445  vancomycin (VANCOCIN) 2,250 mg in sodium chloride 0.9 % 500 mL IVPB        2,250 mg 250 mL/hr over 120 Minutes Intravenous  Once 03/04/21 1437 03/04/21 1741        Objective: Vitals:   03/10/21 1943 03/11/21 0500 03/11/21 0837 03/11/21 1517  BP: 106/61  125/64 129/65  Pulse: 63  (!) 59 67  Resp: 16  16 17   Temp: 99.2 F (37.3 C)  98 F (36.7 C) 97.8 F (36.6 C)  TempSrc:   Oral Oral  SpO2: 97%  100% 99%  Weight:  111.6 kg    Height:        Intake/Output Summary (Last 24  hours) at 03/11/2021 1615 Last data filed at 03/11/2021 1100 Gross per 24 hour  Intake 420 ml  Output 1780 ml  Net -1360 ml    Filed Weights   03/09/21 0500 03/10/21 0500 03/11/21 0500  Weight: 111.6 kg 115.4 kg 111.6 kg    Examination: General exam: Appears comfortable  HEENT: PERRLA, oral mucosa moist, no sclera icterus or thrush Respiratory system: Clear to auscultation. Respiratory effort normal. Cardiovascular system: S1 & S2 heard, regular rate and rhythm Gastrointestinal system: Abdomen soft, non-tender, nondistended. Normal bowel sounds   Central nervous system: Alert and oriented to person only. No focal neurological deficits. Extremities: No cyanosis, clubbing or edema- drain in  upper outer aspect of left thigh - blood tinged fluid in bulb- mild tenderness around drain Skin: No rashes or ulcers Psychiatry:  Mood & affect appropriate.       Data Reviewed: I have personally reviewed following labs and imaging studies  CBC: Recent Labs  Lab 03/05/21 0326 03/06/21 0150 03/07/21 0438 03/09/21 0335 03/10/21 0339  WBC 13.1* 13.2* 13.8* 13.2* 11.4*  HGB 12.8 11.9* 11.1* 10.1* 9.9*  HCT 41.3 37.7 35.1* 32.7* 32.9*  MCV 100.0 97.7 97.8 98.8 101.5*  PLT 503* 483* 462* 432* 229    Basic Metabolic Panel: Recent Labs  Lab 03/05/21 0755 03/06/21 0150 03/07/21 0438 03/09/21 0335 03/10/21 0339  NA 138 137 136 135 136  K 4.2 4.0 3.7 3.9 4.0  CL 100 101 101 100 101  CO2 27 29 27 30 29   GLUCOSE 126* 150* 145* 207* 173*  BUN 7* 5* 8 9 15   CREATININE 0.64 0.78 0.93 0.75 0.88  CALCIUM 9.5 9.4 9.0 9.2 9.4    GFR: Estimated Creatinine Clearance: 59.2 mL/min (by C-G formula based on SCr of 0.88 mg/dL). Liver Function Tests: No results for input(s): AST, ALT, ALKPHOS, BILITOT, PROT, ALBUMIN in the last 168 hours.  No results for input(s): LIPASE, AMYLASE in the last 168 hours. No results for input(s): AMMONIA in the last 168 hours. Coagulation Profile: No results  for input(s): INR, PROTIME in the last 168 hours. Cardiac Enzymes: No results for input(s): CKTOTAL, CKMB, CKMBINDEX, TROPONINI in the last 168 hours. BNP (last 3 results) No results for input(s): PROBNP in the last 8760 hours. HbA1C: No results for input(s): HGBA1C in the last 72 hours. CBG: No results for input(s): GLUCAP in the last 168 hours. Lipid Profile: No results for input(s): CHOL, HDL, LDLCALC, TRIG, CHOLHDL, LDLDIRECT in the last 72 hours. Thyroid Function Tests: No results for input(s): TSH, T4TOTAL,  FREET4, T3FREE, THYROIDAB in the last 72 hours. Anemia Panel: No results for input(s): VITAMINB12, FOLATE, FERRITIN, TIBC, IRON, RETICCTPCT in the last 72 hours. Urine analysis:    Component Value Date/Time   COLORURINE COLORLESS (A) 05/14/2019 1331   APPEARANCEUR CLEAR 05/14/2019 1331   LABSPEC 1.018 05/14/2019 1331   LABSPEC 1.020 05/04/2016 1006   PHURINE 5.0 05/14/2019 1331   GLUCOSEU NEGATIVE 05/14/2019 1331   GLUCOSEU Negative 05/04/2016 1006   HGBUR NEGATIVE 05/14/2019 1331   BILIRUBINUR NEGATIVE 05/14/2019 1331   BILIRUBINUR Negative 05/04/2016 1006   KETONESUR NEGATIVE 05/14/2019 1331   PROTEINUR NEGATIVE 05/14/2019 1331   UROBILINOGEN 0.2 05/04/2016 1006   NITRITE NEGATIVE 05/14/2019 1331   LEUKOCYTESUR NEGATIVE 05/14/2019 1331   LEUKOCYTESUR Small 05/04/2016 1006   Sepsis Labs: @LABRCNTIP (procalcitonin:4,lacticidven:4) ) Recent Results (from the past 240 hour(s))  Blood culture (routine x 2)     Status: None   Collection Time: 03/04/21 12:07 PM   Specimen: BLOOD  Result Value Ref Range Status   Specimen Description BLOOD LEFT ANTECUBITAL  Final   Special Requests   Final    BOTTLES DRAWN AEROBIC AND ANAEROBIC Blood Culture adequate volume   Culture   Final    NO GROWTH 5 DAYS Performed at [facility], [place] [address], [place], [place] [address]    Report Status 03/09/2021 FINAL  Final  Blood culture (routine x 2)     Status: None    Collection Time: 03/04/21  4:09 PM   Specimen: BLOOD  Result Value Ref Range Status   Specimen Description BLOOD SITE NOT SPECIFIED  Final   Special Requests   Final    BOTTLES DRAWN AEROBIC AND ANAEROBIC Blood Culture results may not be optimal due to an inadequate volume of blood received in culture bottles   Culture   Final    NO GROWTH 5 DAYS Performed at [facility], [place] [address] [place], [place] [address]    Report Status 03/09/2021 FINAL  Final  Resp Panel by RT-PCR (Flu A&B, Covid) Nasopharyngeal Swab     Status: None   Collection Time: 03/04/21  5:24 PM   Specimen: Nasopharyngeal Swab; Nasopharyngeal(NP) swabs in vial transport medium  Result Value Ref Range Status   SARS Coronavirus 2 by RT PCR NEGATIVE NEGATIVE Final    Comment: (NOTE) SARS-CoV-2 target nucleic acids are NOT DETECTED.  The SARS-CoV-2 RNA is generally detectable in upper respiratory specimens during the acute phase of infection. The lowest concentration of SARS-CoV-2 viral copies this assay can detect is 138 copies/mL. A negative result does not preclude SARS-Cov-2 infection and should not be used as the sole basis for treatment or other patient management decisions. A negative result may occur with  improper specimen collection/handling, submission of specimen other than nasopharyngeal swab, presence of viral mutation(s) within the areas targeted by this assay, and inadequate number of viral copies(<138 copies/mL). A negative result must be combined with clinical observations, patient history, and epidemiological information. The expected result is Negative.  Fact Sheet for Patients:  EntrepreneurPulse.com.au  Fact Sheet for Healthcare Providers:  IncredibleEmployment.be  This test is no t yet approved or cleared by the [place] FDA and  has been authorized for detection and/or diagnosis of SARS-CoV-2 by FDA under an Emergency Use Authorization  (EUA). This EUA will remain  in effect (meaning this test can be used) for the duration of the COVID-19 declaration under Section 564(b)(1) of the Act, 21 U.S.C.section 360bbb-3(b)(1), unless the authorization is  terminated  or revoked sooner.       Influenza A by PCR NEGATIVE NEGATIVE Final   Influenza B by PCR NEGATIVE NEGATIVE Final    Comment: (NOTE) The Xpert Xpress SARS-CoV-2/FLU/RSV plus assay is intended as an aid in the diagnosis of influenza from Nasopharyngeal swab specimens and should not be used as a sole basis for treatment. Nasal washings and aspirates are unacceptable for Xpert Xpress SARS-CoV-2/FLU/RSV testing.  Fact Sheet for Patients: EntrepreneurPulse.com.au  Fact Sheet for Healthcare Providers: IncredibleEmployment.be  This test is not yet approved or cleared by the [place] FDA and has been authorized for detection and/or diagnosis of SARS-CoV-2 by FDA under an Emergency Use Authorization (EUA). This EUA will remain in effect (meaning this test can be used) for the duration of the COVID-19 declaration under Section 564(b)(1) of the Act, 21 U.S.C. section 360bbb-3(b)(1), unless the authorization is terminated or revoked.  Performed at [facility], [place] [address], [place], [place] [address]   Aerobic/Anaerobic Culture w Gram Stain (surgical/deep wound)     Status: None (Preliminary result)   Collection Time: 03/07/21 11:53 AM   Specimen: Abscess  Result Value Ref Range Status   Specimen Description ABSCESS  Final   Special Requests DRAIN  Final   Gram Stain   Final    ABUNDANT WBC PRESENT,BOTH PMN AND MONONUCLEAR RARE GRAM NEGATIVE RODS Performed at [facility], [address], [place], [place] [address]    Culture   Final    ABUNDANT PROTEUS MIRABILIS NO ANAEROBES ISOLATED; CULTURE IN PROGRESS FOR 5 DAYS    Report Status PENDING  Incomplete   Organism ID, Bacteria PROTEUS MIRABILIS  Final       Susceptibility   Proteus mirabilis - MIC*    AMPICILLIN <=2 SENSITIVE Sensitive     CEFAZOLIN <=4 SENSITIVE Sensitive     CEFEPIME <=0.12 SENSITIVE Sensitive     CEFTAZIDIME <=1 SENSITIVE Sensitive     CEFTRIAXONE <=0.25 SENSITIVE Sensitive     CIPROFLOXACIN <=0.25 SENSITIVE Sensitive     GENTAMICIN <=1 SENSITIVE Sensitive     IMIPENEM 2 SENSITIVE Sensitive     TRIMETH/SULFA <=20 SENSITIVE Sensitive     AMPICILLIN/SULBACTAM <=2 SENSITIVE Sensitive     PIP/TAZO <=4 SENSITIVE Sensitive     * ABUNDANT PROTEUS MIRABILIS  Surgical PCR screen     Status: None   Collection Time: 03/08/21  9:32 AM   Specimen: Nasal Mucosa; Nasal Swab  Result Value Ref Range Status   MRSA, PCR NEGATIVE NEGATIVE Final   Staphylococcus aureus NEGATIVE NEGATIVE Final    Comment: (NOTE) The Xpert SA Assay (FDA approved for NASAL specimens in patients 37 years of age and older), is one component of a comprehensive surveillance program. It is not intended to diagnose infection nor to guide or monitor treatment. Performed at [facility], [place] [address], [place], [place] [address]   Aerobic/Anaerobic Culture w Gram Stain (surgical/deep wound)     Status: None (Preliminary result)   Collection Time: 03/08/21 12:14 PM   Specimen: Other Source; Body Fluid  Result Value Ref Range Status   Specimen Description ABSCESS LEFT HIP  Final   Special Requests LEFT HIP  Final   Gram Stain   Final    RARE WBC PRESENT,BOTH PMN AND MONONUCLEAR NO ORGANISMS SEEN    Culture   Final    RARE PROTEUS MIRABILIS SUSCEPTIBILITIES TO FOLLOW NO ANAEROBES ISOLATED Performed at [facility], [place] [address],  [place], [place] [address]    Report Status PENDING  Incomplete         Radiology Studies: [place] EKG SITE RITE  Result Date: 03/11/2021 If Site Rite image not attached, placement could not be confirmed due to current cardiac rhythm.     Scheduled Meds:  allopurinol  300 mg Oral Daily   aspirin EC  81 mg  Oral BID WC   citalopram  10 mg Oral Daily   docusate sodium  100 mg Oral BID   levothyroxine  125 mcg Oral QAC breakfast   metoCLOPramide  5 mg Oral BID AC & HS   mupirocin ointment  1 application Nasal BID   oxyCODONE  5 mg Oral Q4H while awake   pantoprazole  40 mg Oral BID AC   senna  1 tablet Oral BID   Continuous Infusions:   ceFAZolin (ANCEF) IV 2 g (03/11/21 1004)     LOS: 7 days      Debbe Odea, MD [facility] Pager: [email or web address] 03/11/2021, 4:15 PM

## 2021-03-11 NOTE — Plan of Care (Signed)
  Problem: Activity: Goal: Risk for activity intolerance will decrease Outcome: Progressing   Problem: Coping: Goal: Level of anxiety will decrease Outcome: Progressing   Problem: Pain Managment: Goal: General experience of comfort will improve Outcome: Progressing   Problem: Safety: Goal: Ability to remain free from injury will improve Outcome: Progressing   Problem: Skin Integrity: Goal: Risk for impaired skin integrity will decrease Outcome: Progressing   

## 2021-03-12 ENCOUNTER — Inpatient Hospital Stay: Payer: Self-pay

## 2021-03-12 ENCOUNTER — Inpatient Hospital Stay (HOSPITAL_COMMUNITY): Payer: Medicare Other

## 2021-03-12 DIAGNOSIS — L02416 Cutaneous abscess of left lower limb: Secondary | ICD-10-CM | POA: Diagnosis not present

## 2021-03-12 DIAGNOSIS — Z95828 Presence of other vascular implants and grafts: Secondary | ICD-10-CM

## 2021-03-12 LAB — AEROBIC/ANAEROBIC CULTURE W GRAM STAIN (SURGICAL/DEEP WOUND)

## 2021-03-12 MED ORDER — CHLORHEXIDINE GLUCONATE CLOTH 2 % EX PADS
6.0000 | MEDICATED_PAD | Freq: Every day | CUTANEOUS | Status: DC
  Administered 2021-03-12 – 2021-03-13 (×2): 6 via TOPICAL

## 2021-03-12 MED ORDER — FUROSEMIDE 20 MG PO TABS
20.0000 mg | ORAL_TABLET | Freq: Every day | ORAL | Status: DC
  Administered 2021-03-12 – 2021-03-13 (×2): 20 mg via ORAL
  Filled 2021-03-12 (×2): qty 1

## 2021-03-12 MED ORDER — SPIRONOLACTONE 25 MG PO TABS
25.0000 mg | ORAL_TABLET | Freq: Every day | ORAL | Status: DC
  Administered 2021-03-12 – 2021-03-13 (×2): 25 mg via ORAL
  Filled 2021-03-12 (×2): qty 1

## 2021-03-12 MED ORDER — SODIUM CHLORIDE 0.9% FLUSH: 10.0000 mL | INTRAVENOUS | Status: DC | PRN

## 2021-03-12 NOTE — Progress Notes (Signed)
PHARMACY CONSULT NOTE FOR:  OUTPATIENT  PARENTERAL ANTIBIOTIC THERAPY (OPAT)  Indication: Left hip abscess Regimen: Cefazolin 2 gm IV Q 8 hours  End date: 03/22/21  IV antibiotic discharge orders are pended. To discharging provider:  please sign these orders via discharge navigator,  Select New Orders & click on the button choice - Manage This Unsigned Work.     Thank you for allowing pharmacy to be a part of this patient's care.   Jimmy Footman, PharmD, BCPS, BCIDP Infectious Diseases Clinical Pharmacist Phone: [phone] 03/12/2021, 9:59 AM

## 2021-03-12 NOTE — Progress Notes (Signed)
PROGRESS NOTE    Melinda Kerr   JSE:[id number]  DOB: December 29, 1934  DOA: 03/04/2021 PCP: Maury Dus, MD   Brief Narrative:  Melinda Kerr is an 86 year old female with a history of hypothyroidism, essential hypertension and dyslipidemia who presents to the hospital for left hip pain which has increased since her hemiarthroplasty on 02/05/2021.  She is taken 2 courses of antibiotics for cellulitis as an outpatient.  She is admitted to the hospital for IV antibiotics   Subjective: Review of systems somewhat limited but denies nausea vomiting diarrhea constipation headache fevers or chills.    Assessment & Plan:   Cellulitis and abscess of left thigh post op-  s/p left hemiarthoplasthy 5/11 -6/10- - IR placed a CT-guided 10 French catheter- 170 cc of purulent fluid drained - 6/11> went to OR for I and D by Dr Lyla Glassing- has wound vac now with minimal output - culture growing abundant proteus Mirabilis - she has been on Vanco and Zosyn-  dc Vanc  6/12 - 6/13> changed Zosyn to Ancef  based on sensitivities  -  ID consulted by ortho- they are recommending a PICC and IV antibiotics with Ancef through 03/22/2021 - blood cultures previously negative  Gout - Continue allopurinol; not in acute exacerbation    Hypothyroidism -Continue Synthroid  Essential hypertension Chronic diastolic CHF (congestive heart failure) (HCC) -Restart Lasix and Aldactone follow labs  Normocytic anemia - Hemoglobin ranges from 11-12  Time spent in minutes: 74min DVT prophylaxis: SCDs Start: 03/08/21 1425 Code Status: DO NOT RESUSCITATE Family Communication:  Level of Care: Level of care: Med-Surg Disposition Plan:  Status is: Inpatient  Remains inpatient appropriate because:IV treatments appropriate due to intensity of illness or inability to take PO  Dispo: The patient is from: SNF              Anticipated d/c is to: SNF              Patient currently is medically stable to d/c.   Difficult to  place patient No  Consultants:  Orthopedic surgery IR ID  Procedures:  IR -CT-guided drain placement  Antimicrobials:  Anti-infectives (From admission, onward)    Start     Dose/Rate Route Frequency Ordered Stop   03/10/21 1500  vancomycin (VANCOREADY) IVPB 1250 mg/250 mL  Status:  Discontinued        1,250 mg 166.7 mL/hr over 90 Minutes Intravenous Every 24 hours 03/09/21 1500 03/09/21 1718   03/10/21 0915  ceFAZolin (ANCEF) IVPB 2g/100 mL premix       Note to Pharmacy: Pharmacy may adjust dose for treatment of left groin abscess   2 g 200 mL/hr over 30 Minutes Intravenous Every 8 hours 03/10/21 0820     03/08/21 1213  tobramycin (NEBCIN) powder  Status:  Discontinued          As needed 03/08/21 1213 03/08/21 1304   03/08/21 0945  ceFAZolin (ANCEF) IVPB 2g/100 mL premix        2 g 200 mL/hr over 30 Minutes Intravenous To [facility] 03/08/21 0445 03/08/21 1155   03/05/21 1500  vancomycin (VANCOREADY) IVPB 1000 mg/200 mL  Status:  Discontinued        1,000 mg 200 mL/hr over 60 Minutes Intravenous Every 24 hours 03/04/21 1505 03/09/21 1500   03/04/21 1600  piperacillin-tazobactam (ZOSYN) IVPB 3.375 g  Status:  Discontinued        3.375 g 12.5 mL/hr over 240 Minutes Intravenous Every 8 hours 03/04/21 1546 03/10/21  0820   03/04/21 1445  vancomycin (VANCOCIN) 2,250 mg in sodium chloride 0.9 % 500 mL IVPB        2,250 mg 250 mL/hr over 120 Minutes Intravenous  Once 03/04/21 1437 03/04/21 1741        Objective: Vitals:   03/11/21 0837 03/11/21 1517 03/11/21 2245 03/12/21 0939  BP: 125/64 129/65 126/66 119/65  Pulse: (!) 59 67 61 63  Resp: 16 17 15 16   Temp: 98 F (36.7 C) 97.8 F (36.6 C) 98.9 F (37.2 C) 98.2 F (36.8 C)  TempSrc: Oral Oral  Oral  SpO2: 100% 99% 99% 100%  Weight:      Height:        Intake/Output Summary (Last 24 hours) at 03/12/2021 1535 Last data filed at 03/12/2021 1044 Gross per 24 hour  Intake 408.79 ml  Output 1530 ml  Net -1121.21  ml    Filed Weights   03/09/21 0500 03/10/21 0500 03/11/21 0500  Weight: 111.6 kg 115.4 kg 111.6 kg    Examination:  General exam: Appears comfortable, in no acute distress HEENT: PERRLA, oral mucosa moist, no sclera icterus or thrush Respiratory system: Clear to auscultation. Respiratory effort normal. Cardiovascular system: S1 & S2 heard, regular rate and rhythm Gastrointestinal system: Abdomen soft, non-tender, nondistended. Normal bowel sounds   Central nervous system: Alert and oriented to person only. No focal neurological deficits. Extremities: No cyanosis, clubbing or edema-wound VAC with minimal output Skin: No rashes or ulcers Psychiatry:  Mood & affect appropriate.     Data Reviewed: I have personally reviewed following labs and imaging studies  CBC: Recent Labs  Lab 03/06/21 0150 03/07/21 0438 03/09/21 0335 03/10/21 0339  WBC 13.2* 13.8* 13.2* 11.4*  HGB 11.9* 11.1* 10.1* 9.9*  HCT 37.7 35.1* 32.7* 32.9*  MCV 97.7 97.8 98.8 101.5*  PLT 483* 462* 432* 315    Basic Metabolic Panel: Recent Labs  Lab 03/06/21 0150 03/07/21 0438 03/09/21 0335 03/10/21 0339  NA 137 136 135 136  K 4.0 3.7 3.9 4.0  CL 101 101 100 101  CO2 29 27 30 29   GLUCOSE 150* 145* 207* 173*  BUN 5* 8 9 15   CREATININE 0.78 0.93 0.75 0.88  CALCIUM 9.4 9.0 9.2 9.4    GFR: Estimated Creatinine Clearance: 59.2 mL/min (by C-G formula based on SCr of 0.88 mg/dL). Liver Function Tests: No results for input(s): AST, ALT, ALKPHOS, BILITOT, PROT, ALBUMIN in the last 168 hours.  No results for input(s): LIPASE, AMYLASE in the last 168 hours. No results for input(s): AMMONIA in the last 168 hours. Coagulation Profile: No results for input(s): INR, PROTIME in the last 168 hours. Cardiac Enzymes: No results for input(s): CKTOTAL, CKMB, CKMBINDEX, TROPONINI in the last 168 hours. BNP (last 3 results) No results for input(s): PROBNP in the last 8760 hours. HbA1C: No results for input(s):  HGBA1C in the last 72 hours. CBG: No results for input(s): GLUCAP in the last 168 hours. Lipid Profile: No results for input(s): CHOL, HDL, LDLCALC, TRIG, CHOLHDL, LDLDIRECT in the last 72 hours. Thyroid Function Tests: No results for input(s): TSH, T4TOTAL, FREET4, T3FREE, THYROIDAB in the last 72 hours. Anemia Panel: No results for input(s): VITAMINB12, FOLATE, FERRITIN, TIBC, IRON, RETICCTPCT in the last 72 hours. Urine analysis:    Component Value Date/Time   COLORURINE COLORLESS (A) 05/14/2019 1331   APPEARANCEUR CLEAR 05/14/2019 1331   LABSPEC 1.018 05/14/2019 1331   LABSPEC 1.020 05/04/2016 1006   PHURINE 5.0 05/14/2019 1331  GLUCOSEU NEGATIVE 05/14/2019 1331   GLUCOSEU Negative 05/04/2016 1006   HGBUR NEGATIVE 05/14/2019 1331   BILIRUBINUR NEGATIVE 05/14/2019 1331   BILIRUBINUR Negative 05/04/2016 1006   KETONESUR NEGATIVE 05/14/2019 1331   PROTEINUR NEGATIVE 05/14/2019 1331   UROBILINOGEN 0.2 05/04/2016 1006   NITRITE NEGATIVE 05/14/2019 1331   LEUKOCYTESUR NEGATIVE 05/14/2019 1331   LEUKOCYTESUR Small 05/04/2016 1006   Sepsis Labs: @LABRCNTIP (procalcitonin:4,lacticidven:4) ) Recent Results (from the past 240 hour(s))  Blood culture (routine x 2)     Status: None   Collection Time: 03/04/21 12:07 PM   Specimen: BLOOD  Result Value Ref Range Status   Specimen Description BLOOD LEFT ANTECUBITAL  Final   Special Requests   Final    BOTTLES DRAWN AEROBIC AND ANAEROBIC Blood Culture adequate volume   Culture   Final    NO GROWTH 5 DAYS Performed at [facility], [place] [address] [place], [place] [address]    Report Status 03/09/2021 FINAL  Final  Blood culture (routine x 2)     Status: None   Collection Time: 03/04/21  4:09 PM   Specimen: BLOOD  Result Value Ref Range Status   Specimen Description BLOOD SITE NOT SPECIFIED  Final   Special Requests   Final    BOTTLES DRAWN AEROBIC AND ANAEROBIC Blood Culture results may not be optimal due to an inadequate  volume of blood received in culture bottles   Culture   Final    NO GROWTH 5 DAYS Performed at [facility], [place] [address] [place], [place] [address]    Report Status 03/09/2021 FINAL  Final  Resp Panel by RT-PCR (Flu A&B, Covid) Nasopharyngeal Swab     Status: None   Collection Time: 03/04/21  5:24 PM   Specimen: Nasopharyngeal Swab; Nasopharyngeal(NP) swabs in vial transport medium  Result Value Ref Range Status   SARS Coronavirus 2 by RT PCR NEGATIVE NEGATIVE Final    Comment: (NOTE) SARS-CoV-2 target nucleic acids are NOT DETECTED.  The SARS-CoV-2 RNA is generally detectable in upper respiratory specimens during the acute phase of infection. The lowest concentration of SARS-CoV-2 viral copies this assay can detect is 138 copies/mL. A negative result does not preclude SARS-Cov-2 infection and should not be used as the sole basis for treatment or other patient management decisions. A negative result may occur with  improper specimen collection/handling, submission of specimen other than nasopharyngeal swab, presence of viral mutation(s) within the areas targeted by this assay, and inadequate number of viral copies(<138 copies/mL). A negative result must be combined with clinical observations, patient history, and epidemiological information. The expected result is Negative.  Fact Sheet for Patients:  EntrepreneurPulse.com.au  Fact Sheet for Healthcare Providers:  IncredibleEmployment.be  This test is no t yet approved or cleared by the [place] FDA and  has been authorized for detection and/or diagnosis of SARS-CoV-2 by FDA under an Emergency Use Authorization (EUA). This EUA will remain  in effect (meaning this test can be used) for the duration of the COVID-19 declaration under Section 564(b)(1) of the Act, 21 U.S.C.section 360bbb-3(b)(1), unless the authorization is terminated  or revoked sooner.       Influenza A by PCR  NEGATIVE NEGATIVE Final   Influenza B by PCR NEGATIVE NEGATIVE Final    Comment: (NOTE) The Xpert Xpress SARS-CoV-2/FLU/RSV plus assay is intended as an aid in the diagnosis of influenza from Nasopharyngeal swab specimens and should not be used as a sole basis for treatment. Nasal washings and  aspirates are unacceptable for Xpert Xpress SARS-CoV-2/FLU/RSV testing.  Fact Sheet for Patients: EntrepreneurPulse.com.au  Fact Sheet for Healthcare Providers: IncredibleEmployment.be  This test is not yet approved or cleared by the [place] FDA and has been authorized for detection and/or diagnosis of SARS-CoV-2 by FDA under an Emergency Use Authorization (EUA). This EUA will remain in effect (meaning this test can be used) for the duration of the COVID-19 declaration under Section 564(b)(1) of the Act, 21 U.S.C. section 360bbb-3(b)(1), unless the authorization is terminated or revoked.  Performed at [facility], [place] [address], [place], [place] [address]   Aerobic/Anaerobic Culture w Gram Stain (surgical/deep wound)     Status: None   Collection Time: 03/07/21 11:53 AM   Specimen: Abscess  Result Value Ref Range Status   Specimen Description ABSCESS  Final   Special Requests DRAIN  Final   Gram Stain   Final    ABUNDANT WBC PRESENT,BOTH PMN AND MONONUCLEAR RARE GRAM NEGATIVE RODS    Culture   Final    ABUNDANT PROTEUS MIRABILIS NO ANAEROBES ISOLATED Performed at [facility], [address], [place], [place] [address]    Report Status 03/12/2021 FINAL  Final   Organism ID, Bacteria PROTEUS MIRABILIS  Final      Susceptibility   Proteus mirabilis - MIC*    AMPICILLIN <=2 SENSITIVE Sensitive     CEFAZOLIN <=4 SENSITIVE Sensitive     CEFEPIME <=0.12 SENSITIVE Sensitive     CEFTAZIDIME <=1 SENSITIVE Sensitive     CEFTRIAXONE <=0.25 SENSITIVE Sensitive     CIPROFLOXACIN <=0.25 SENSITIVE Sensitive     GENTAMICIN <=1 SENSITIVE  Sensitive     IMIPENEM 2 SENSITIVE Sensitive     TRIMETH/SULFA <=20 SENSITIVE Sensitive     AMPICILLIN/SULBACTAM <=2 SENSITIVE Sensitive     PIP/TAZO <=4 SENSITIVE Sensitive     * ABUNDANT PROTEUS MIRABILIS  Surgical PCR screen     Status: None   Collection Time: 03/08/21  9:32 AM   Specimen: Nasal Mucosa; Nasal Swab  Result Value Ref Range Status   MRSA, PCR NEGATIVE NEGATIVE Final   Staphylococcus aureus NEGATIVE NEGATIVE Final    Comment: (NOTE) The Xpert SA Assay (FDA approved for NASAL specimens in patients 52 years of age and older), is one component of a comprehensive surveillance program. It is not intended to diagnose infection nor to guide or monitor treatment. Performed at [facility], [place] [address] [place], [place] [address]   Aerobic/Anaerobic Culture w Gram Stain (surgical/deep wound)     Status: None   Collection Time: 03/08/21 12:14 PM   Specimen: Other Source; Body Fluid  Result Value Ref Range Status   Specimen Description ABSCESS LEFT HIP  Final   Special Requests LEFT HIP  Final   Gram Stain   Final    RARE WBC PRESENT,BOTH PMN AND MONONUCLEAR NO ORGANISMS SEEN    Culture   Final    RARE PROTEUS MIRABILIS NO ANAEROBES ISOLATED Performed at [facility], [address], [place],  [address]    Report Status 03/12/2021 FINAL  Final   Organism ID, Bacteria PROTEUS MIRABILIS  Final      Susceptibility   Proteus mirabilis - MIC*    AMPICILLIN <=2 SENSITIVE Sensitive     CEFAZOLIN <=4 SENSITIVE Sensitive     CEFEPIME <=0.12 SENSITIVE Sensitive     CEFTAZIDIME <=1 SENSITIVE Sensitive     CEFTRIAXONE <=0.25 SENSITIVE Sensitive     CIPROFLOXACIN <=0.25  SENSITIVE Sensitive     GENTAMICIN <=1 SENSITIVE Sensitive     IMIPENEM 2 SENSITIVE Sensitive     TRIMETH/SULFA <=20 SENSITIVE Sensitive     AMPICILLIN/SULBACTAM <=2 SENSITIVE Sensitive     PIP/TAZO <=4 SENSITIVE Sensitive     * RARE PROTEUS MIRABILIS         Radiology  Studies: DG Chest Port 1 View  Result Date: 03/12/2021 CLINICAL DATA:  Status post PICC line placement EXAM: PORTABLE CHEST 1 VIEW COMPARISON:  Film from earlier in the same day. FINDINGS: Cardiac shadow remains enlarged. Aortic calcifications are again seen. Left-sided PICC line is noted in the proximal superior vena cava slightly advanced when compared with the prior exam. This could be advanced 3 cm as clinically indicated. Lungs are hypoinflated but otherwise clear. No bony abnormality is seen. Old right shoulder replacement is noted. IMPRESSION: Left-sided PICC line is been advanced somewhat now lying in the proximal superior vena cava. This could be advanced up to 3 cm distally into the superior vena cava. Electronically Signed   By: Inez Catalina M.D.   On: 03/12/2021 13:01   DG CHEST PORT 1 VIEW  Result Date: 03/12/2021 CLINICAL DATA:  86 year old female PICC line placement. EXAM: PORTABLE CHEST 1 VIEW COMPARISON:  Chest CTA 05/14/2019 and earlier. FINDINGS: Portable AP semi upright view at 1012 hours. Chronic right total shoulder arthroplasty. Stable lung volumes and mediastinal contours. Left side PICC line tip projects to the right of the trachea compatible with placement at the innominate vein confluence. This is about 3 cm above the carina. No pneumothorax. Allowing for portable technique the lungs are clear. Paucity of bowel gas in the upper abdomen. No acute osseous abnormality identified. IMPRESSION: 1. Left side PICC line tip at the innominate vein confluence. Advance up to 3 cm to allow for placement at the SVC level. 2. No acute cardiopulmonary abnormality. Electronically Signed   By: Genevie Ann M.D.   On: 03/12/2021 10:25   [place] EKG Site Rite  Result Date: 03/12/2021 If Site Rite image not attached, placement could not be confirmed due to current cardiac rhythm.  [place] EKG SITE RITE  Result Date: 03/11/2021 If Site Rite image not attached, placement could not be confirmed due to current  cardiac rhythm.     Scheduled Meds:  allopurinol  300 mg Oral Daily   aspirin EC  81 mg Oral BID WC   Chlorhexidine Gluconate Cloth  6 each Topical Daily   citalopram  10 mg Oral Daily   docusate sodium  100 mg Oral BID   levothyroxine  125 mcg Oral QAC breakfast   metoCLOPramide  5 mg Oral BID AC & HS   mupirocin ointment  1 application Nasal BID   pantoprazole  40 mg Oral BID AC   senna  1 tablet Oral BID   Continuous Infusions:   ceFAZolin (ANCEF) IV 2 g (03/12/21 1343)     LOS: 8 days   Little Ishikawa, DO [facility] Pager: Secure chat  After 7 PM: [email or web address] 03/12/2021, 3:35 PM

## 2021-03-12 NOTE — TOC Progression Note (Signed)
Transition of Care [facility]) - Progression Note    Patient Details  Name: Melinda Kerr MRN: [id number] Date of Birth: [birth year]  Transition of Care [facility] CM/SW Contact  Milinda Antis, LCSWA Phone Number: 03/12/2021, 4:14 PM  Clinical Narrative:     CSW called [place] with admissions at [facility], [phone].  CSW was informed that the patient could return to the facility with a PICC line.   CSW will call facility and send d/c summary once summary is available.    Expected Discharge Plan and Services                                                 Social Determinants of Health (SDOH) Interventions    Readmission Risk Interventions Readmission Risk Prevention Plan 02/06/2021 05/10/2020  Transportation Screening Complete Complete  PCP or Specialist Appt within 5-7 Days Complete Complete  Home Care Screening Complete Complete  Medication Review (RN CM) Complete Complete  Some recent data might be hidden

## 2021-03-12 NOTE — Progress Notes (Signed)
Peripherally Inserted Central Catheter Placement  The IV Nurse has discussed with the patient and/or persons authorized to consent for the patient, the purpose of this procedure and the potential benefits and risks involved with this procedure.  The benefits include less needle sticks, lab draws from the catheter, and the patient may be discharged home with the catheter. Risks include, but not limited to, infection, bleeding, blood clot (thrombus formation), and puncture of an artery; nerve damage and irregular heartbeat and possibility to perform a PICC exchange if needed/ordered by physician.  Alternatives to this procedure were also discussed.  Bard Power PICC patient education guide, fact sheet on infection prevention and patient information card has been provided to patient /or left at bedside.    PICC Placement Documentation  PICC Single Lumen 55/01/58 Left Basilic 42 cm (Active)  Indication for Insertion or Continuance of Line Prolonged intravenous therapies 03/12/21 0903  Exposed Catheter (cm) 0 cm 03/12/21 6825  Site Assessment Clean;Dry;Intact 03/12/21 0903  Line Status Flushed;Blood return noted;Saline locked 03/12/21 0903  Dressing Type Transparent 03/12/21 0903  Dressing Status Clean;Dry;Intact 03/12/21 0903  Antimicrobial disc in place? Yes 03/12/21 0903  Dressing Change Due 03/19/21 03/12/21 0903       Scotty Court 03/12/2021, 9:06 AM

## 2021-03-12 NOTE — Progress Notes (Signed)
Left sided PICC at innominate vein. For exchange PICC.

## 2021-03-12 NOTE — Progress Notes (Addendum)
[facility] for Infectious Disease  Date of Admission:  03/04/2021           Reason for visit: Follow up on left hip abscess  Current antibiotics: Cefazolin 6/13--present   Previous antibiotics: Vancomycin 6/7--6/12 Piperacillin tazobactam 6/7--6/12  ASSESSMENT:    Left hip abscess and recent hip arthroplasty (02/05/2021): Abscess occurred over the site of prior skin breakdown from adhesive tape (not at the site of her prior surgical incision).  She is status post IR guided drain placement 03/07/2021 into the fluid collection with cultures yielding Proteus mirabilis.  She subsequently had formal I&D on 03/08/2021 with Dr. Lyla Glassing.  Those cultures also grew Proteus.  Discussed with orthopedics and they noted that her I&D was carried out through the original incision site.  In order to reach the area of abscess they tunneled through to reach the area of concern.  They then placed a wound VAC and JP drain.  This was done in order to avoid creating a second incision site.  She is currently on cefazolin and continues to do well.  PICC line was placed today as it was felt she warranted a course of IV therapy after discussing with orthopedics.  The abscess appeared to be contained to the skin and subcutaneous tissue and did not communicate with the left hip joint space and the fascial suture line was intact at the time of her I&D.  PLAN:    Continue cefazolin 2 g every 8 hours.  Will plan for 2 weeks of IV antibiotics from her washout procedure through 03/22/2021.  We will then plan to see her back in clinic and consider transition to oral antibiotics for an additional 1 to 2 weeks for completion of therapy.  Discussed with pharmacist this morning and likely will do amoxicillin 500 mg 3 times daily or Keflex 500 mg 4 times daily at that time. See OP AT note below.  Please call with any questions.  Will arrange follow up 03/20/21 with Dr Megan Salon as I will be out of town during that week.      Diagnosis: Left hip abcess  Culture Result: Proteus mirabilis  Allergies  Allergen Reactions   Oxybutynin Other (See Comments)    Bleeding gums, felt sick    OPAT Orders Discharge antibiotics to be given via PICC line Discharge antibiotics: Per pharmacy protocol cefazolin  Duration: 2 weeks End Date: 03/22/21  [facility] Care Per Protocol:  Home health RN for IV administration and teaching; PICC line care and labs.    Labs weekly while on IV antibiotics: _x_ CBC with differential _x_ BMP __ CMP __ CRP __ ESR __ Vancomycin trough __ CK  _x_ Please pull PIC at completion of IV antibiotics __ Please leave PIC in place until doctor has seen patient or been notified  Fax weekly labs to [phone]  Clinic Follow Up Appt: 03/20/21 @ 11am with Dr Megan Salon    Principal Problem:   Abscess of hip, left Active Problems:   OSA on CPAP   Hypothyroidism   Dyslipidemia   Depression   Essential hypertension   Cellulitis   Chronic diastolic CHF (congestive heart failure) (HCC)    MEDICATIONS:    Scheduled Meds:  allopurinol  300 mg Oral Daily   aspirin EC  81 mg Oral BID WC   citalopram  10 mg Oral Daily   docusate sodium  100 mg Oral BID   levothyroxine  125 mcg Oral QAC breakfast   metoCLOPramide  5 mg Oral BID AC & HS   mupirocin ointment  1 application Nasal BID   pantoprazole  40 mg Oral BID AC   senna  1 tablet Oral BID   white petrolatum       Continuous Infusions:   ceFAZolin (ANCEF) IV 2 g (03/11/21 2250)   PRN Meds:.baclofen, hydrALAZINE, hydrOXYzine, ondansetron **OR** ondansetron (ZOFRAN) IV, oxyCODONE, oxyCODONE, polyethylene glycol  SUBJECTIVE:    No acute events Got picc line today Afebrile, hip pain is moderate but controlled.  No other new complaints  Review of Systems  All other systems reviewed and are negative.    OBJECTIVE:   Blood pressure 119/65, pulse 63, temperature 98.2 F (36.8 C), temperature source Oral, resp. rate  16, height '5\' 5"'  (1.651 m), weight 111.6 kg, SpO2 100 %. Body mass index is 40.94 kg/m.  Physical Exam Constitutional:      General: She is not in acute distress.    Appearance: Normal appearance.  Eyes:     Extraocular Movements: Extraocular movements intact.     Conjunctiva/sclera: Conjunctivae normal.  Musculoskeletal:     Comments: LUE PICC line Left hip JP drain and wound vac in place.   Skin:    General: Skin is warm and dry.     Findings: No rash.  Neurological:     General: No focal deficit present.     Mental Status: She is alert and oriented to person, place, and time.  Psychiatric:        Mood and Affect: Mood normal.        Behavior: Behavior normal.     Lab Results: Lab Results  Component Value Date   WBC 11.4 (H) 03/10/2021   HGB 9.9 (L) 03/10/2021   HCT 32.9 (L) 03/10/2021   MCV 101.5 (H) 03/10/2021   PLT 382 03/10/2021    Lab Results  Component Value Date   NA 136 03/10/2021   K 4.0 03/10/2021   CO2 29 03/10/2021   GLUCOSE 173 (H) 03/10/2021   BUN 15 03/10/2021   CREATININE 0.88 03/10/2021   CALCIUM 9.4 03/10/2021   GFRNONAA >60 03/10/2021   GFRAA >60 05/11/2020    Lab Results  Component Value Date   ALT 13 03/04/2021   AST 22 03/04/2021   ALKPHOS 138 (H) 03/04/2021   BILITOT 0.5 03/04/2021    No results found for: CRP  No results found for: ESRSEDRATE   I have reviewed the micro and lab results in Epic.  Imaging: [place] EKG SITE RITE  Result Date: 03/11/2021 If Site Rite image not attached, placement could not be confirmed due to current cardiac rhythm.    Imaging independently reviewed in Epic.    Raynelle Highland for Infectious Disease [facility] [phone] pager 03/12/2021, 10:24 AM  I spent greater than 35 minutes with the patient including greater than 50% of time in face to face counsel of the patient and in coordination of their care.

## 2021-03-12 NOTE — Progress Notes (Signed)
Occupational Therapy Treatment Patient Details Name: Melinda Kerr MRN: [id number] DOB: [birth year] Today's Date: 03/12/2021    History of present illness 86 y.o. female presents to [facility] ED on 03/04/2021 with L hip pain and redness after recent L hip hemiarthroplasty on 02/05/2021. Pt underwent US and CT guided catheter placement to drain left anterior-lateral thigh abscess on 03/07/2021. PMH includes chronic diastolic congestive heart failure, essential hypertension, CAD, history of CVA, OSA on CPAP, HLD, endometrial cancer, Hx DVT.   OT comments  Melinda Kerr is incrementally progressing towards her goals she continues to be limited by pain with any movement. Session focused on progressing bed mobility with plans to transfer OOB. Pt required +2 assistance for rolling and required max vc for sequencing to use BUE. Pt actively resisted any BLE movement towards the EOB and verbalized her fear of falling stating "I fell in this room last time with my shoulder." Due to pain and fear, pt practiced pulling into long sitting supported in chair position. Max A +2 to pull trunk off of back support, maintained position for 3 second prior to needed rest break. Pt continues to benefit from OT acutely. D/c plan remains appropriate.    Follow Up Recommendations  SNF;Supervision/Assistance - 24 hour    Equipment Recommendations  None recommended by OT       Precautions / Restrictions Precautions Precautions: Fall Precaution Comments: L hip drain Restrictions Weight Bearing Restrictions: Yes LLE Weight Bearing: Weight bearing as tolerated       Mobility Bed Mobility Overal bed mobility: Needs Assistance Bed Mobility: Rolling Rolling: Total assist;+2 for physical assistance;+2 for safety/equipment         General bed mobility comments: pt unable to roll to sidelying due to pain; pt actively resisting against +2 assistance for bed mobility    Transfers Overall transfer level: Needs assistance                General transfer comment: pt declines        ADL either performed or assessed with clinical judgement   ADL Overall ADL's : Needs assistance/impaired     Grooming: Wash/dry face;Bed level;Set up         Functional mobility during ADLs: Total assistance General ADL Comments: session focused on bed mobility and strengthening to progress completing functional tasks OOB      Cognition Arousal/Alertness: Awake/alert Behavior During Therapy: WFL for tasks assessed/performed Overall Cognitive Status: No family/caregiver present to determine baseline cognitive functioning             General Comments: follows commands, answers questions appropriately; oriented x4. Flat affect and trembling throughout session, suspect anxiety              General Comments No new concerns    Pertinent Vitals/ Pain       Pain Assessment: Faces Faces Pain Scale: Hurts even more Pain Location: L hip, back Pain Descriptors / Indicators: Aching;Sore;Discomfort;Grimacing Pain Intervention(s): Limited activity within patient's tolerance;Monitored during session   Frequency  Min 2X/week        Progress Toward Goals  OT Goals(current goals can now be found in the care plan section)  Progress towards OT goals: Not progressing toward goals - comment (Pt self limiting - pain and refusing mobility)  Acute Rehab OT Goals Patient Stated Goal: walk in those bars at [facility] OT Goal Formulation: With patient Time For Goal Achievement: 03/23/21 Potential to Achieve Goals: Good ADL Goals Pt Will Perform Eating: Independently;sitting Pt Will Perform Grooming: with  set-up;sitting Pt Will Perform Upper Body Bathing: with set-up;sitting Pt Will Perform Lower Body Bathing: with mod assist;sit to/from stand Pt Will Perform Upper Body Dressing: with set-up;sitting Pt Will Perform Lower Body Dressing: with mod assist;sit to/from stand Pt Will Transfer to Toilet: with mod assist;stand pivot  transfer;bedside commode  Plan Discharge plan remains appropriate       AM-PAC OT "6 Clicks" Daily Activity     Outcome Measure   Help from another person eating meals?: A Little Help from another person taking care of personal grooming?: A Little Help from another person toileting, which includes using toliet, bedpan, or urinal?: Total Help from another person bathing (including washing, rinsing, drying)?: A Lot Help from another person to put on and taking off regular upper body clothing?: A Lot Help from another person to put on and taking off regular lower body clothing?: A Lot 6 Click Score: 13    End of Session    OT Visit Diagnosis: Other abnormalities of gait and mobility (R26.89);Pain Pain - part of body: Hip (back)   Activity Tolerance Patient limited by pain   Patient Left in bed;with call bell/phone within reach   Nurse Communication Mobility status        Time: 3943-2003 OT Time Calculation (min): 30 min  Charges: OT General Charges $OT Visit: 1 Visit OT Treatments $Therapeutic Activity: 23-37 mins    Kada Friesen A Jisella Ashenfelter 03/12/2021, 4:02 PM

## 2021-03-13 DIAGNOSIS — L02416 Cutaneous abscess of left lower limb: Secondary | ICD-10-CM | POA: Diagnosis not present

## 2021-03-13 LAB — CBC
HCT: 32.4 % — ABNORMAL LOW (ref 36.0–46.0)
Hemoglobin: 10.1 g/dL — ABNORMAL LOW (ref 12.0–15.0)
MCH: 31.2 pg (ref 26.0–34.0)
MCHC: 31.2 g/dL (ref 30.0–36.0)
MCV: 100 fL (ref 80.0–100.0)
Platelets: 384 10*3/uL (ref 150–400)
RBC: 3.24 MIL/uL — ABNORMAL LOW (ref 3.87–5.11)
RDW: 14.3 % (ref 11.5–15.5)
WBC: 9.2 10*3/uL (ref 4.0–10.5)
nRBC: 0 % (ref 0.0–0.2)

## 2021-03-13 LAB — BASIC METABOLIC PANEL
Anion gap: 3 — ABNORMAL LOW (ref 5–15)
BUN: 14 mg/dL (ref 8–23)
CO2: 35 mmol/L — ABNORMAL HIGH (ref 22–32)
Calcium: 9.4 mg/dL (ref 8.9–10.3)
Chloride: 100 mmol/L (ref 98–111)
Creatinine, Ser: 0.68 mg/dL (ref 0.44–1.00)
GFR, Estimated: >60 mL/min (ref >60)
Glucose, Bld: 146 mg/dL — ABNORMAL HIGH (ref 70–99)
Potassium: 4.2 mmol/L (ref 3.5–5.1)
Sodium: 138 mmol/L (ref 135–145)

## 2021-03-13 MED ORDER — HEPARIN SOD (PORK) LOCK FLUSH 100 UNIT/ML IV SOLN
250.0000 [IU] | INTRAVENOUS | Status: AC | PRN
  Administered 2021-03-13: 250 [IU]
  Filled 2021-03-13: qty 2.5

## 2021-03-13 MED ORDER — MAGNESIUM CITRATE PO SOLN
1.0000 | Freq: Once | ORAL | Status: AC
  Administered 2021-03-13: 1 via ORAL
  Filled 2021-03-13: qty 296

## 2021-03-13 MED ORDER — CEFAZOLIN IV (FOR PTA / DISCHARGE USE ONLY): 2.0000 g | Freq: Three times a day (TID) | INTRAVENOUS | 0 refills | Status: AC

## 2021-03-13 NOTE — Care Management Important Message (Signed)
Important Message  Patient Details  Name: Melinda Kerr MRN: [id number] Date of Birth: [birth year]   Medicare Important Message Given:  Yes     Gifford Ballon P [place] 03/13/2021, 3:29 PM

## 2021-03-13 NOTE — Progress Notes (Signed)
Report given to [facility] staff nurse, all questions and concerns were fully answered.

## 2021-03-13 NOTE — TOC Progression Note (Addendum)
Transition of Care [facility]) - Progression Note    Patient Details  Name: Melinda Kerr MRN: [id number] Date of Birth: [birth year]  Transition of Care [facility]) CM/SW [place], [place] Phone Number: 03/13/2021, 8:48 AM  Clinical Narrative:    CSW observed that the d/c order is in for the patient.  [place] with the SNF ([facility]) was called in an attempt to alert the agency of the patient's return.  There was no answer.  CSW left a message and is awaiting a response.  Pending: Agency confirmation that they are prepared for patient's return and discharge summary.   09:16-  CSW informed that the facility can accept the patient today.  D/C summary has been completed.      Expected Discharge Plan and Services           Expected Discharge Date: 03/13/21                                     Social Determinants of Health (SDOH) Interventions    Readmission Risk Interventions Readmission Risk Prevention Plan 02/06/2021 05/10/2020  Transportation Screening Complete Complete  PCP or Specialist Appt within 5-7 Days Complete Complete  Home Care Screening Complete Complete  Medication Review (RN CM) Complete Complete  Some recent data might be hidden

## 2021-03-13 NOTE — Plan of Care (Signed)

## 2021-03-13 NOTE — Discharge Summary (Signed)
Physician Discharge Summary  [name] IRS:[id number] DOB: 1935-05-07 DOA: 03/04/2021  PCP: Melinda Dus, MD  Admit date: 03/04/2021 Discharge date: 03/13/2021  Admitted From: SNF Disposition:  SNF  Recommendations for Outpatient Follow-up:  Follow up with PCP in 1-2 weeks Please obtain BMP/CBC in one week  Discharge Condition:Stable  CODE STATUS:DNR  Diet recommendation:  Regular diet as tolerated  Brief/Interim Summary: Melinda Kerr is an 86 year old female with a history of hypothyroidism, essential hypertension and dyslipidemia who presents to the hospital for left hip pain which has increased since her hemiarthroplasty on 02/05/2021.  She is taken 2 courses of antibiotics for cellulitis as an outpatient.  She is admitted to the hospital for IV antibiotics.   Cellulitis and abscess of left thigh post op-  s/p left hemiarthoplasthy 5/11 -6/10- - IR placed a CT-guided 10 French catheter- 170 cc of purulent fluid drained - 6/11> went to OR for I and D by Dr Melinda Kerr- has wound vac now with minimal output - Culture growing abundant proteus Mirabilis 6/13> changed Zosyn to Ancef  based on sensitivities through 03/22/21 - ID consulted by ortho- they are recommending a PICC and IV antibiotics with Ancef through 03/22/2021   Gout - Continue allopurinol; not in acute exacerbation   Hypothyroidism - Continue Synthroid   Essential hypertension Chronic diastolic CHF (congestive heart failure) (HCC) - Restart Lasix and Aldactone follow labs   Normocytic anemia - Hemoglobin ranges from 11-12  Discharge Diagnoses:  Principal Problem:   Abscess of hip, left Active Problems:   OSA on CPAP   Hypothyroidism   Dyslipidemia   Depression   Essential hypertension   Cellulitis   Chronic diastolic CHF (congestive heart failure) [facility])    Discharge Instructions  Discharge Instructions     [facility] pharmacist to adjust dose for Vancomycin, Aminoglycosides and other  anti-infective therapies as requested by physician.   Complete by: As directed    [facility] to provide Cath Flo 40m   Complete by: As directed    Administer for PICC line occlusion and as ordered by physician for other access device issues.   Anaphylaxis Kit: Provided to treat any anaphylactic reaction to the medication being provided to the patient if First Dose or when requested by physician   Complete by: As directed    Epinephrine 136mml vial / amp: Administer 0.73m88m0.73ml11mubcutaneously once for moderate to severe anaphylaxis, nurse to call physician and pharmacy when reaction occurs and call 911 if needed for immediate care   Diphenhydramine 50mg57mIV vial: Administer 25-50mg 96mM PRN for first dose reaction, rash, itching, mild reaction, nurse to call physician and pharmacy when reaction occurs   Sodium Chloride 0.9% NS 500ml I42mdminister if needed for hypovolemic blood pressure drop or as ordered by physician after call to physician with anaphylactic reaction   Call MD for:  redness, tenderness, or signs of infection (pain, swelling, redness, odor or green/yellow discharge around incision site)   Complete by: As directed    Call MD for:  severe uncontrolled pain   Complete by: As directed    Call MD for:  temperature >100.4   Complete by: As directed    Change dressing on IV access line weekly and PRN   Complete by: As directed    Diet - low sodium heart healthy   Complete by: As directed    Discharge wound care:   Complete by: As directed    Per surgery   Flush IV  access with Sodium Chloride 0.9% and Heparin 10 units/ml or 100 units/ml   Complete by: As directed    Home infusion instructions - [facility]   Complete by: As directed    Instructions: Flush IV access with Sodium Chloride 0.9% and Heparin 10units/ml or 100units/ml   Change dressing on IV access line: Weekly and PRN   Instructions Cath Flo 75m: Administer for PICC Line occlusion and as  ordered by physician for other access device   [facility] pharmacist to adjust dose for: Vancomycin, Aminoglycosides and other anti-infective therapies as requested by physician   Increase activity slowly   Complete by: As directed    Method of administration may be changed at the discretion of home infusion pharmacist based upon assessment of the patient and/or caregiver's ability to self-administer the medication ordered   Complete by: As directed       Allergies as of 03/13/2021       Reactions   Oxybutynin Other (See Comments)   Bleeding gums, felt sick        Medication List     STOP taking these medications    acetaminophen 500 MG tablet Commonly known as: TYLENOL   cefadroxil 500 MG capsule Commonly known as: DURICEF   cetirizine 10 MG tablet Commonly known as: ZYRTEC   Fish Oil 1000 MG Caps   pyridOXINE 100 MG tablet Commonly known as: VITAMIN B-6   saccharomyces boulardii 250 MG capsule Commonly known as: FLORASTOR   vitamin B-12 1000 MCG tablet Commonly known as: CYANOCOBALAMIN   vitamin C 500 MG tablet Commonly known as: ASCORBIC ACID       TAKE these medications    albuterol 108 (90 Base) MCG/ACT inhaler Commonly known as: VENTOLIN HFA Inhale 2 puffs into the lungs every 8 (eight) hours as needed for wheezing.   allopurinol 300 MG tablet Commonly known as: ZYLOPRIM Take 300 mg by mouth daily.   aspirin EC 81 MG tablet Take 1 tablet (81 mg total) by mouth 2 (two) times daily with a meal. Swallow whole.   baclofen 10 MG tablet Commonly known as: LIORESAL Take 1 tablet (10 mg total) by mouth 3 (three) times daily as needed for muscle spasms.   calcium carbonate 500 MG chewable tablet Commonly known as: TUMS - dosed in mg elemental calcium Chew 1 tablet by mouth daily.   ceFAZolin  IVPB Commonly known as: ANCEF Inject 2 g into the vein every 8 (eight) hours for 10 days. Indication:  Left thigh abscess First Dose: Yes Last  Day of Therapy:  03/22/21 Labs - Once weekly:  CBC/D and BMP, Labs - Every other week:  ESR and CRP Method of administration: IV Push Method of administration may be changed at the discretion of home infusion pharmacist based upon assessment of the patient and/or caregiver's ability to self-administer the medication ordered.   citalopram 10 MG tablet Commonly known as: CELEXA Take 10 mg by mouth daily.   diclofenac Sodium 1 % Gel Commonly known as: VOLTAREN Apply 2 g topically every 12 (twelve) hours as needed (pain).   docusate sodium 100 MG capsule Commonly known as: COLACE Take 1 capsule (100 mg total) by mouth 2 (two) times daily.   furosemide 40 MG tablet Commonly known as: LASIX Take 0.5 tablets (20 mg total) by mouth daily.   hydrOXYzine 25 MG tablet Commonly known as: ATARAX/VISTARIL Take 25 mg by mouth at bedtime.   levothyroxine 125 MCG tablet Commonly known as: SYNTHROID Take  125 mcg by mouth daily before breakfast.   metoCLOPramide 5 MG tablet Commonly known as: REGLAN Take 5 mg by mouth 2 (two) times daily at 8 am and 10 pm.   nystatin powder Commonly known as: MYCOSTATIN/NYSTOP Apply 1 Bottle topically See admin instructions. Apply topically to abdominal skin folds and under bilateral breast for intertrigo - twice daily - please make sure skin has been cleaned and is dry before applying   oxyCODONE 5 MG immediate release tablet Commonly known as: Oxy IR/ROXICODONE Take 5 mg by mouth See admin instructions. Take 5 mg by mouth every four hours while awake and an additional 5 mg every four hours as needed for severe pain   OXYGEN Inhale 2 L/min into the lungs See admin instructions. 2 L/min every shift for shortness of breath and every 8 hours as needed to maintain sats of at least 90%   pantoprazole 40 MG tablet Commonly known as: PROTONIX Take 1 tablet (40 mg total) by mouth 2 (two) times daily before a meal.   polyethylene glycol 17 g packet Commonly  known as: MIRALAX / GLYCOLAX Take 17 g by mouth daily as needed for mild constipation or moderate constipation. What changed: reasons to take this   Polysaccharide-Iron Complex 150 MG Caps Take 150 mg by mouth in the morning. What changed: Another medication with the same name was removed. Continue taking this medication, and follow the directions you see here.   senna-docusate 8.6-50 MG tablet Commonly known as: Senokot-S Take 1 tablet by mouth daily.   Simethicone 250 MG Caps Take 250 mg by mouth every 8 (eight) hours as needed (gas).   spironolactone 25 MG tablet Commonly known as: ALDACTONE Take 25 mg by mouth daily.   Vitamin D (Ergocalciferol) 1.25 MG (50000 UNIT) Caps capsule Commonly known as: DRISDOL Take 50,000 Units by mouth every Monday.               Discharge Care Instructions  (From admission, onward)           Start     Ordered   03/13/21 0000  Change dressing on IV access line weekly and PRN  (Home infusion instructions - [facility] )        03/13/21 0802   03/13/21 0000  Discharge wound care:       Comments: Per surgery   03/13/21 0802            Follow-up Information     Swinteck, Aaron Edelman, MD. Schedule an appointment as soon as possible for a visit on 03/21/2021.   Specialty: Orthopedic Surgery Why: removal of negative pressure dressing Contact information: [address] [place] [address] [phone]                Allergies  Allergen Reactions   Oxybutynin Other (See Comments)    Bleeding gums, felt sick    Consultations: IR, ID, Orthopedic Sx  Procedures/Studies: DG Pelvis Portable  Result Date: 03/08/2021 CLINICAL DATA:  86 year old female status post left hip incision and drainage EXAM: PORTABLE PELVIS 1-2 VIEWS COMPARISON:  02/05/2021 FINDINGS: Visualized bony pelvis without acute displaced fracture. Osteopenia. Surgical changes of the right hip again noted with antegrade  intramedullary rod and cannulated interlocking screw. Plate and screw fixation proximal right femur incompletely imaged. Similar sclerotic appearance at the femoral neck and proximal right femur. Redemonstration of early surgical changes of left hip arthroplasty. No erosive changes or sclerotic changes of the proximal left femur. Tiny rounded  lucencies within the soft tissues lateral to the greater trochanter, potentially secondary to recent surgery. IMPRESSION: Similar appearance of postsurgical changes of left hip arthroplasty with no bony erosive changes or sclerotic changes. Tiny lucencies within the soft tissues lateral to the greater trochanter may be related to the patient's recent surgery. Unchanged appearance of chronic surgical changes of the right proximal femur/femoral neck. Electronically Signed   By: Corrie Mckusick D.O.   On: 03/08/2021 15:16   CT Hip Left Wo Contrast  Result Date: 03/04/2021 CLINICAL DATA:  Left hip redness, soft tissue infection. History of left hip arthroplasty on 02/05/2021 EXAM: CT OF THE LEFT HIP WITHOUT CONTRAST TECHNIQUE: Multidetector CT imaging of the left hip was performed according to the standard protocol. Multiplanar CT image reconstructions were also generated. COMPARISON:  02/05/2021, 02/03/2021 x-ray FINDINGS: Bones/Joint/Cartilage Postsurgical changes from left hip hemiarthroplasty. No evidence of periprosthetic fracture or lucency. Ill-defined mineralization within the soft tissues adjacent to the greater and lesser trochanters, which may reflect developing heterotopic ossification. No appreciable periprosthetic fluid collection. Ligaments Suboptimally assessed by CT. Muscles and Tendons Mild atrophy of the proximal thigh musculature. No acute musculotendinous abnormality by CT. Soft tissues Lobulated fluid and air containing collection within the subcutaneous soft tissues overlying the anterolateral aspect of the left hip and proximal thigh measuring approximately  11.2 x 5.0 x 8.3 cm. Collection abuts the does not extend deep to the superficial investing fascia of the proximal thigh musculature. There is surrounding subcutaneous edema. Overlying skin thickening. No gas within the surrounding soft tissues outside of the collection. Mildly prominent left inguinal lymph nodes, likely reactive. IMPRESSION: 1. Lobulated fluid and air containing collection within the subcutaneous soft tissues overlying the anterolateral aspect of the left hip and proximal thigh measuring approximately 11 x 5 x 8 cm, concerning for abscess versus infected postoperative seroma or hematoma. 2. Postsurgical changes from left hip hemiarthroplasty without evidence of periprosthetic fracture or loosening. No periprosthetic fluid collection. Previously described fluid collection does not appear to communicate with the left hip joint space. 3. Mildly prominent left inguinal lymph nodes, likely reactive. Electronically Signed   By: Davina Poke D.O.   On: 03/04/2021 13:10   DG Chest Port 1 View  Result Date: 03/12/2021 CLINICAL DATA:  Status post PICC line placement EXAM: PORTABLE CHEST 1 VIEW COMPARISON:  Film from earlier in the same day. FINDINGS: Cardiac shadow remains enlarged. Aortic calcifications are again seen. Left-sided PICC line is noted in the proximal superior vena cava slightly advanced when compared with the prior exam. This could be advanced 3 cm as clinically indicated. Lungs are hypoinflated but otherwise clear. No bony abnormality is seen. Old right shoulder replacement is noted. IMPRESSION: Left-sided PICC line is been advanced somewhat now lying in the proximal superior vena cava. This could be advanced up to 3 cm distally into the superior vena cava. Electronically Signed   By: Inez Catalina M.D.   On: 03/12/2021 13:01   DG CHEST PORT 1 VIEW  Result Date: 03/12/2021 CLINICAL DATA:  86 year old female PICC line placement. EXAM: PORTABLE CHEST 1 VIEW COMPARISON:  Chest CTA  05/14/2019 and earlier. FINDINGS: Portable AP semi upright view at 1012 hours. Chronic right total shoulder arthroplasty. Stable lung volumes and mediastinal contours. Left side PICC line tip projects to the right of the trachea compatible with placement at the innominate vein confluence. This is about 3 cm above the carina. No pneumothorax. Allowing for portable technique the lungs are clear. Paucity of bowel gas  in the upper abdomen. No acute osseous abnormality identified. IMPRESSION: 1. Left side PICC line tip at the innominate vein confluence. Advance up to 3 cm to allow for placement at the SVC level. 2. No acute cardiopulmonary abnormality. Electronically Signed   By: Genevie Ann M.D.   On: 03/12/2021 10:25   CT IMAGE GUIDED FLUID DRAIN BY CATHETER  Result Date: 03/07/2021 INDICATION: Recent left total hip replacement, now with indeterminate fluid collection about the anterolateral aspect of the left thigh worrisome for abscess. Please perform percutaneous aspiration and/or drainage catheter placement. EXAM: ULTRASOUND AND CT IMAGE GUIDED FLUID DRAIN BY CATHETER COMPARISON:  Left hip CT-03/04/2021 MEDICATIONS: The patient is currently admitted to the hospital and receiving intravenous antibiotics. The antibiotics were administered within an appropriate time frame prior to the initiation of the procedure. ANESTHESIA/SEDATION: Moderate (conscious) sedation was employed during this procedure. A total of Versed 1 mg and Fentanyl 50 mcg was administered intravenously. Moderate Sedation Time: 22 minutes. The patient's level of consciousness and vital signs were monitored continuously by radiology nursing throughout the procedure under my direct supervision. CONTRAST:  None COMPLICATIONS: None immediate. PROCEDURE: Informed written consent was obtained from the patient after a discussion of the risks, benefits and alternatives to treatment. The patient was placed supine on the CT gantry and a pre procedural CT was  performed re-demonstrating the known abscess/fluid collection within the subcutaneous tissues of the anterolateral aspect of the left thigh with dominant air in fluid containing component measuring at least 8.2 x 5.0 cm (image 23, series 2). The CT gantry table position was marked and the collection was identified sonographically. The procedure was planned. A timeout was performed prior to the initiation of the procedure. The skin overlying the lateral aspect the left thigh was prepped and draped in the usual sterile fashion. The overlying soft tissues were anesthetized with 1% lidocaine with epinephrine. Under direct ultrasound guidance, the inferior aspect the complex fluid collection was accessed with an 18 gauge trocar needle allowing coiling of a short Amplatz wire within the collection. Multiple ultrasound images were saved procedural documentation purposes. Appropriate positioning was confirmed with a limited CT scan. The tract was serially dilated allowing placement of a 10 [place] all-purpose drainage catheter. Appropriate positioning was confirmed with a limited postprocedural CT scan. Approximately 170 ml of purulent fluid was aspirated. The tube was connected to a JP bulb and sutured in place. A dressing was placed. The patient tolerated the procedure well without immediate post procedural complication. IMPRESSION: Successful ultrasound and CT guided placement of a 10 French all purpose drain catheter into the abscess within the subcutaneous tissues about the anterolateral aspect the left thigh with aspiration of 170 mL of purulent fluid. Samples were sent to the laboratory as requested by the ordering clinical team. Electronically Signed   By: Sandi Mariscal M.D.   On: 03/07/2021 12:42   [place] EKG Site Rite  Result Date: 03/12/2021 If Site Rite image not attached, placement could not be confirmed due to current cardiac rhythm.  [place] EKG SITE RITE  Result Date: 03/11/2021 If Site Rite image not attached,  placement could not be confirmed due to current cardiac rhythm.  [place] EKG SITE RITE  Result Date: 03/09/2021 If Site Rite image not attached, placement could not be confirmed due to current cardiac rhythm.    Subjective: No acute issues/events overnight   Discharge Exam: Vitals:   03/12/21 2004 03/13/21 0728  BP: 121/67 135/74  Pulse: 68 63  Resp: 16 17  Temp: 98.9 F (37.2 C) 97.7 F (36.5 C)  SpO2: 99% 100%   Vitals:   03/12/21 0939 03/12/21 1541 03/12/21 2004 03/13/21 0728  BP: 119/65 133/66 121/67 135/74  Pulse: 63 69 68 63  Resp: _0 Temp: 98.2 F (36.8 C) 98.7 F (37.1 C) 98.9 F (37.2 C) 97.7 F (36.5 C)  TempSrc: Oral Oral Oral Oral  SpO2: 100% 98% 99% 100%  Weight:      Height:        General: Pt is alert, awake, not in acute distress Cardiovascular: RRR, S1/S2 +, no rubs, no gallops Respiratory: CTA bilaterally, no wheezing, no rhonchi Abdominal: Soft, NT, ND, bowel sounds + Extremities: no edema, no cyanosis    The results of significant diagnostics from this hospitalization (including imaging, microbiology, ancillary and laboratory) are listed below for reference.     Microbiology: Recent Results (from the past 240 hour(s))  Blood culture (routine x 2)     Status: None   Collection Time: 03/04/21 12:07 PM   Specimen: BLOOD  Result Value Ref Range Status   Specimen Description BLOOD LEFT ANTECUBITAL  Final   Special Requests   Final    BOTTLES DRAWN AEROBIC AND ANAEROBIC Blood Culture adequate volume   Culture   Final    NO GROWTH 5 DAYS Performed at [facility], [address] [place], [place] [address]    Report Status 03/09/2021 FINAL  Final  Blood culture (routine x 2)     Status: None   Collection Time: 03/04/21  4:09 PM   Specimen: BLOOD  Result Value Ref Range Status   Specimen Description BLOOD SITE NOT SPECIFIED  Final   Special Requests   Final    BOTTLES DRAWN AEROBIC AND ANAEROBIC Blood Culture results may not  be optimal due to an inadequate volume of blood received in culture bottles   Culture   Final    NO GROWTH 5 DAYS Performed at [facility], [place] [address] [place], [place] [address]    Report Status 03/09/2021 FINAL  Final  Resp Panel by RT-PCR (Flu A&B, Covid) Nasopharyngeal Swab     Status: None   Collection Time: 03/04/21  5:24 PM   Specimen: Nasopharyngeal Swab; Nasopharyngeal(NP) swabs in vial transport medium  Result Value Ref Range Status   SARS Coronavirus 2 by RT PCR NEGATIVE NEGATIVE Final    Comment: (NOTE) SARS-CoV-2 target nucleic acids are NOT DETECTED.  The SARS-CoV-2 RNA is generally detectable in upper respiratory specimens during the acute phase of infection. The lowest concentration of SARS-CoV-2 viral copies this assay can detect is 138 copies/mL. A negative result does not preclude SARS-Cov-2 infection and should not be used as the sole basis for treatment or other patient management decisions. A negative result may occur with  improper specimen collection/handling, submission of specimen other than nasopharyngeal swab, presence of viral mutation(s) within the areas targeted by this assay, and inadequate number of viral copies(<138 copies/mL). A negative result must be combined with clinical observations, patient history, and epidemiological information. The expected result is Negative.  Fact Sheet for Patients:  EntrepreneurPulse.com.au  Fact Sheet for Healthcare Providers:  IncredibleEmployment.be  This test is no t yet approved or cleared by the [place] FDA and  has been authorized for detection and/or diagnosis of SARS-CoV-2 by FDA under an Emergency Use Authorization (EUA). This EUA will remain  in effect (meaning this test can be used) for the duration of the  COVID-19 declaration under Section 564(b)(1) of the Act, 21 U.S.C.section 360bbb-3(b)(1), unless the authorization is terminated  or revoked  sooner.       Influenza A by PCR NEGATIVE NEGATIVE Final   Influenza B by PCR NEGATIVE NEGATIVE Final    Comment: (NOTE) The Xpert Xpress SARS-CoV-2/FLU/RSV plus assay is intended as an aid in the diagnosis of influenza from Nasopharyngeal swab specimens and should not be used as a sole basis for treatment. Nasal washings and aspirates are unacceptable for Xpert Xpress SARS-CoV-2/FLU/RSV testing.  Fact Sheet for Patients: EntrepreneurPulse.com.au  Fact Sheet for Healthcare Providers: IncredibleEmployment.be  This test is not yet approved or cleared by the [place] FDA and has been authorized for detection and/or diagnosis of SARS-CoV-2 by FDA under an Emergency Use Authorization (EUA). This EUA will remain in effect (meaning this test can be used) for the duration of the COVID-19 declaration under Section 564(b)(1) of the Act, 21 U.S.C. section 360bbb-3(b)(1), unless the authorization is terminated or revoked.  Performed at [facility], [place] [address], [place], [place] [address]   Aerobic/Anaerobic Culture w Gram Stain (surgical/deep wound)     Status: None   Collection Time: 03/07/21 11:53 AM   Specimen: Abscess  Result Value Ref Range Status   Specimen Description ABSCESS  Final   Special Requests DRAIN  Final   Gram Stain   Final    ABUNDANT WBC PRESENT,BOTH PMN AND MONONUCLEAR RARE GRAM NEGATIVE RODS    Culture   Final    ABUNDANT PROTEUS MIRABILIS NO ANAEROBES ISOLATED Performed at [facility], [address], [place], [place] [address]    Report Status 03/12/2021 FINAL  Final   Organism ID, Bacteria PROTEUS MIRABILIS  Final      Susceptibility   Proteus mirabilis - MIC*    AMPICILLIN <=2 SENSITIVE Sensitive     CEFAZOLIN <=4 SENSITIVE Sensitive     CEFEPIME <=0.12 SENSITIVE Sensitive     CEFTAZIDIME <=1 SENSITIVE Sensitive     CEFTRIAXONE <=0.25 SENSITIVE Sensitive     CIPROFLOXACIN <=0.25 SENSITIVE  Sensitive     GENTAMICIN <=1 SENSITIVE Sensitive     IMIPENEM 2 SENSITIVE Sensitive     TRIMETH/SULFA <=20 SENSITIVE Sensitive     AMPICILLIN/SULBACTAM <=2 SENSITIVE Sensitive     PIP/TAZO <=4 SENSITIVE Sensitive     * ABUNDANT PROTEUS MIRABILIS  Surgical PCR screen     Status: None   Collection Time: 03/08/21  9:32 AM   Specimen: Nasal Mucosa; Nasal Swab  Result Value Ref Range Status   MRSA, PCR NEGATIVE NEGATIVE Final   Staphylococcus aureus NEGATIVE NEGATIVE Final    Comment: (NOTE) The Xpert SA Assay (FDA approved for NASAL specimens in patients 23 years of age and older), is one component of a comprehensive surveillance program. It is not intended to diagnose infection nor to guide or monitor treatment. Performed at [facility], [place] [address] [place], [place] [address]   Aerobic/Anaerobic Culture w Gram Stain (surgical/deep wound)     Status: None   Collection Time: 03/08/21 12:14 PM   Specimen: Other Source; Body Fluid  Result Value Ref Range Status   Specimen Description ABSCESS LEFT HIP  Final   Special Requests LEFT HIP  Final   Gram Stain   Final    RARE WBC PRESENT,BOTH PMN AND MONONUCLEAR NO ORGANISMS SEEN    Culture   Final    RARE PROTEUS MIRABILIS NO ANAEROBES ISOLATED Performed at [facility],  [address], [place], [place] [address]    Report Status 03/12/2021 FINAL  Final   Organism ID, Bacteria PROTEUS MIRABILIS  Final      Susceptibility   Proteus mirabilis - MIC*    AMPICILLIN <=2 SENSITIVE Sensitive     CEFAZOLIN <=4 SENSITIVE Sensitive     CEFEPIME <=0.12 SENSITIVE Sensitive     CEFTAZIDIME <=1 SENSITIVE Sensitive     CEFTRIAXONE <=0.25 SENSITIVE Sensitive     CIPROFLOXACIN <=0.25 SENSITIVE Sensitive     GENTAMICIN <=1 SENSITIVE Sensitive     IMIPENEM 2 SENSITIVE Sensitive     TRIMETH/SULFA <=20 SENSITIVE Sensitive     AMPICILLIN/SULBACTAM <=2 SENSITIVE Sensitive     PIP/TAZO <=4 SENSITIVE Sensitive     * RARE PROTEUS  MIRABILIS     Labs: BNP (last 3 results) No results for input(s): BNP in the last 8760 hours. Basic Metabolic Panel: Recent Labs  Lab 03/07/21 0438 03/09/21 0335 03/10/21 0339 03/13/21 0221  NA 136 135 136 138  K 3.7 3.9 4.0 4.2  CL 101 100 101 100  CO2 _0 35*  GLUCOSE 145* 207* 173* 146*  BUN _1 CREATININE 0.93 0.75 0.88 0.68  CALCIUM 9.0 9.2 9.4 9.4   Liver Function Tests: No results for input(s): AST, ALT, ALKPHOS, BILITOT, PROT, ALBUMIN in the last 168 hours. No results for input(s): LIPASE, AMYLASE in the last 168 hours. No results for input(s): AMMONIA in the last 168 hours. CBC: Recent Labs  Lab 03/07/21 0438 03/09/21 0335 03/10/21 0339 03/13/21 0221  WBC 13.8* 13.2* 11.4* 9.2  HGB 11.1* 10.1* 9.9* 10.1*  HCT 35.1* 32.7* 32.9* 32.4*  MCV 97.8 98.8 101.5* 100.0  PLT 462* 432* 382 384   Cardiac Enzymes: No results for input(s): CKTOTAL, CKMB, CKMBINDEX, TROPONINI in the last 168 hours. BNP: Invalid input(s): POCBNP CBG: No results for input(s): GLUCAP in the last 168 hours. D-Dimer No results for input(s): DDIMER in the last 72 hours. Hgb A1c No results for input(s): HGBA1C in the last 72 hours. Lipid Profile No results for input(s): CHOL, HDL, LDLCALC, TRIG, CHOLHDL, LDLDIRECT in the last 72 hours. Thyroid function studies No results for input(s): TSH, T4TOTAL, T3FREE, THYROIDAB in the last 72 hours.  Invalid input(s): FREET3 Anemia work up No results for input(s): VITAMINB12, FOLATE, FERRITIN, TIBC, IRON, RETICCTPCT in the last 72 hours. Urinalysis    Component Value Date/Time   COLORURINE COLORLESS (A) 05/14/2019 1331   APPEARANCEUR CLEAR 05/14/2019 1331   LABSPEC 1.018 05/14/2019 1331   LABSPEC 1.020 05/04/2016 1006   PHURINE 5.0 05/14/2019 1331   GLUCOSEU NEGATIVE 05/14/2019 1331   GLUCOSEU Negative 05/04/2016 1006   HGBUR NEGATIVE 05/14/2019 1331   BILIRUBINUR NEGATIVE 05/14/2019 1331   BILIRUBINUR Negative 05/04/2016 1006    KETONESUR NEGATIVE 05/14/2019 1331   PROTEINUR NEGATIVE 05/14/2019 1331   UROBILINOGEN 0.2 05/04/2016 1006   NITRITE NEGATIVE 05/14/2019 1331   LEUKOCYTESUR NEGATIVE 05/14/2019 1331   LEUKOCYTESUR Small 05/04/2016 1006   Sepsis Labs Invalid input(s): PROCALCITONIN,  WBC,  LACTICIDVEN Microbiology Recent Results (from the past 240 hour(s))  Blood culture (routine x 2)     Status: None   Collection Time: 03/04/21 12:07 PM   Specimen: BLOOD  Result Value Ref Range Status   Specimen Description BLOOD LEFT ANTECUBITAL  Final   Special Requests   Final    BOTTLES DRAWN AEROBIC AND ANAEROBIC Blood Culture adequate volume   Culture   Final    NO  GROWTH 5 DAYS Performed at [facility], [place] [address], [place], [place] [address]    Report Status 03/09/2021 FINAL  Final  Blood culture (routine x 2)     Status: None   Collection Time: 03/04/21  4:09 PM   Specimen: BLOOD  Result Value Ref Range Status   Specimen Description BLOOD SITE NOT SPECIFIED  Final   Special Requests   Final    BOTTLES DRAWN AEROBIC AND ANAEROBIC Blood Culture results may not be optimal due to an inadequate volume of blood received in culture bottles   Culture   Final    NO GROWTH 5 DAYS Performed at [facility], [place] [address] [place], [place] [address]    Report Status 03/09/2021 FINAL  Final  Resp Panel by RT-PCR (Flu A&B, Covid) Nasopharyngeal Swab     Status: None   Collection Time: 03/04/21  5:24 PM   Specimen: Nasopharyngeal Swab; Nasopharyngeal(NP) swabs in vial transport medium  Result Value Ref Range Status   SARS Coronavirus 2 by RT PCR NEGATIVE NEGATIVE Final    Comment: (NOTE) SARS-CoV-2 target nucleic acids are NOT DETECTED.  The SARS-CoV-2 RNA is generally detectable in upper respiratory specimens during the acute phase of infection. The lowest concentration of SARS-CoV-2 viral copies this assay can detect is 138 copies/mL. A negative result does not preclude  SARS-Cov-2 infection and should not be used as the sole basis for treatment or other patient management decisions. A negative result may occur with  improper specimen collection/handling, submission of specimen other than nasopharyngeal swab, presence of viral mutation(s) within the areas targeted by this assay, and inadequate number of viral copies(<138 copies/mL). A negative result must be combined with clinical observations, patient history, and epidemiological information. The expected result is Negative.  Fact Sheet for Patients:  EntrepreneurPulse.com.au  Fact Sheet for Healthcare Providers:  IncredibleEmployment.be  This test is no t yet approved or cleared by the [place] FDA and  has been authorized for detection and/or diagnosis of SARS-CoV-2 by FDA under an Emergency Use Authorization (EUA). This EUA will remain  in effect (meaning this test can be used) for the duration of the COVID-19 declaration under Section 564(b)(1) of the Act, 21 U.S.C.section 360bbb-3(b)(1), unless the authorization is terminated  or revoked sooner.       Influenza A by PCR NEGATIVE NEGATIVE Final   Influenza B by PCR NEGATIVE NEGATIVE Final    Comment: (NOTE) The Xpert Xpress SARS-CoV-2/FLU/RSV plus assay is intended as an aid in the diagnosis of influenza from Nasopharyngeal swab specimens and should not be used as a sole basis for treatment. Nasal washings and aspirates are unacceptable for Xpert Xpress SARS-CoV-2/FLU/RSV testing.  Fact Sheet for Patients: EntrepreneurPulse.com.au  Fact Sheet for Healthcare Providers: IncredibleEmployment.be  This test is not yet approved or cleared by the [place] FDA and has been authorized for detection and/or diagnosis of SARS-CoV-2 by FDA under an Emergency Use Authorization (EUA). This EUA will remain in effect (meaning this test can be used) for the duration of  the COVID-19 declaration under Section 564(b)(1) of the Act, 21 U.S.C. section 360bbb-3(b)(1), unless the authorization is terminated or revoked.  Performed at [facility], [place] [address] [place], [place] [address]   Aerobic/Anaerobic Culture w Gram Stain (surgical/deep wound)     Status: None   Collection Time: 03/07/21 11:53 AM   Specimen: Abscess  Result Value Ref Range Status   Specimen Description ABSCESS  Final  Special Requests DRAIN  Final   Gram Stain   Final    ABUNDANT WBC PRESENT,BOTH PMN AND MONONUCLEAR RARE GRAM NEGATIVE RODS    Culture   Final    ABUNDANT PROTEUS MIRABILIS NO ANAEROBES ISOLATED Performed at [facility], [address], [place], [place] [address]    Report Status 03/12/2021 FINAL  Final   Organism ID, Bacteria PROTEUS MIRABILIS  Final      Susceptibility   Proteus mirabilis - MIC*    AMPICILLIN <=2 SENSITIVE Sensitive     CEFAZOLIN <=4 SENSITIVE Sensitive     CEFEPIME <=0.12 SENSITIVE Sensitive     CEFTAZIDIME <=1 SENSITIVE Sensitive     CEFTRIAXONE <=0.25 SENSITIVE Sensitive     CIPROFLOXACIN <=0.25 SENSITIVE Sensitive     GENTAMICIN <=1 SENSITIVE Sensitive     IMIPENEM 2 SENSITIVE Sensitive     TRIMETH/SULFA <=20 SENSITIVE Sensitive     AMPICILLIN/SULBACTAM <=2 SENSITIVE Sensitive     PIP/TAZO <=4 SENSITIVE Sensitive     * ABUNDANT PROTEUS MIRABILIS  Surgical PCR screen     Status: None   Collection Time: 03/08/21  9:32 AM   Specimen: Nasal Mucosa; Nasal Swab  Result Value Ref Range Status   MRSA, PCR NEGATIVE NEGATIVE Final   Staphylococcus aureus NEGATIVE NEGATIVE Final    Comment: (NOTE) The Xpert SA Assay (FDA approved for NASAL specimens in patients 38 years of age and older), is one component of a comprehensive surveillance program. It is not intended to diagnose infection nor to guide or monitor treatment. Performed at [facility], [place] [address], [place], [place] [address]   Aerobic/Anaerobic Culture  w Gram Stain (surgical/deep wound)     Status: None   Collection Time: 03/08/21 12:14 PM   Specimen: Other Source; Body Fluid  Result Value Ref Range Status   Specimen Description ABSCESS LEFT HIP  Final   Special Requests LEFT HIP  Final   Gram Stain   Final    RARE WBC PRESENT,BOTH PMN AND MONONUCLEAR NO ORGANISMS SEEN    Culture   Final    RARE PROTEUS MIRABILIS NO ANAEROBES ISOLATED Performed at [facility], [address], [place], [place] [address]    Report Status 03/12/2021 FINAL  Final   Organism ID, Bacteria PROTEUS MIRABILIS  Final      Susceptibility   Proteus mirabilis - MIC*    AMPICILLIN <=2 SENSITIVE Sensitive     CEFAZOLIN <=4 SENSITIVE Sensitive     CEFEPIME <=0.12 SENSITIVE Sensitive     CEFTAZIDIME <=1 SENSITIVE Sensitive     CEFTRIAXONE <=0.25 SENSITIVE Sensitive     CIPROFLOXACIN <=0.25 SENSITIVE Sensitive     GENTAMICIN <=1 SENSITIVE Sensitive     IMIPENEM 2 SENSITIVE Sensitive     TRIMETH/SULFA <=20 SENSITIVE Sensitive     AMPICILLIN/SULBACTAM <=2 SENSITIVE Sensitive     PIP/TAZO <=4 SENSITIVE Sensitive     * RARE PROTEUS MIRABILIS     Time coordinating discharge: Over 30 minutes  SIGNED:   Little Ishikawa, DO [facility] 03/13/2021, 9:21 AM Pager   If 7PM-7AM, please contact night-coverage [email or web address]

## 2021-03-13 NOTE — TOC Transition Note (Signed)
Transition of Care [facility]) - CM/SW Discharge Note   Patient Details  Name: Melinda Kerr MRN: [id number] Date of Birth: 1935/07/28  [facility] CM/SW Contact:  Milinda Antis, [place] Phone Number: 03/13/2021, 9:59 AM   Clinical Narrative:    Patient will DC to: [facility] Anticipated DC date: 03/13/2021 Family notified: Yes Transport by: Corey Harold   Per MD patient ready for DC to SNF. RN to call report prior to discharge [phone]. RN, patient, patient's family, and facility notified of DC. Discharge Summary and FL2 sent to facility. DC packet on chart. Ambulance transport requested for patient.   CSW will sign off for now as social work intervention is no longer needed. Please consult [place] again if new needs arise.     Final next level of care: Skilled Nursing Facility Barriers to Discharge: Barriers Resolved   Patient Goals and CMS Choice        Discharge Placement              Patient chooses bed at: [facility], [place] Patient to be transferred to facility by: [place] Name of family member notified: Yow (POA), [name] (Niece)   [phone] Patient and family notified of of transfer: 03/13/21  Discharge Plan and Services                                     Social Determinants of Health (SDOH) Interventions     Readmission Risk Interventions Readmission Risk Prevention Plan 02/06/2021 05/10/2020  Transportation Screening Complete Complete  PCP or Specialist Appt within 5-7 Days Complete Complete  Home Care Screening Complete Complete  Medication Review (RN CM) Complete Complete  Some recent data might be hidden

## 2021-03-13 NOTE — Progress Notes (Signed)
Discharge summary packet provided to  PTAR staff/pertinent documents. Pt remains alert/oriented in no apparent distress, D/C to [facility] as ordered, No complaints. Still awaiting from IV team to d/c picc line.

## 2021-03-20 ENCOUNTER — Telehealth: Payer: Self-pay

## 2021-03-20 ENCOUNTER — Ambulatory Visit: Payer: Medicare Other | Admitting: Internal Medicine

## 2021-03-20 NOTE — Telephone Encounter (Signed)
Patient missed appointment today with Dr. Megan Salon. Patient is currently in a facility [facility]) and per the nurse Cleon Gustin) at [facility] they were unaware the patient had an appointment. Patient also due to stop IV antibiotics on 03/22/21.  Patient has been rescheduled to see Dr. Juleen China on 03/25/21.  Verbal orders given to Aja to extend patient's IV antibiotics until her visit with Dr. Juleen China 03/25/21 per Dr. Megan Salon. I have provided Aja with  patient's new appointment date,time and location.  Franklyn Cafaro T Brooks Sailors

## 2021-03-25 ENCOUNTER — Other Ambulatory Visit: Payer: Self-pay

## 2021-03-25 ENCOUNTER — Ambulatory Visit (INDEPENDENT_AMBULATORY_CARE_PROVIDER_SITE_OTHER): Payer: Medicare Other | Admitting: Internal Medicine

## 2021-03-25 ENCOUNTER — Telehealth: Payer: Self-pay

## 2021-03-25 ENCOUNTER — Encounter: Payer: Self-pay | Admitting: Internal Medicine

## 2021-03-25 VITALS — BP 109/65 | HR 66 | Temp 98.4°F

## 2021-03-25 DIAGNOSIS — Z96642 Presence of left artificial hip joint: Secondary | ICD-10-CM | POA: Diagnosis not present

## 2021-03-25 DIAGNOSIS — L02416 Cutaneous abscess of left lower limb: Secondary | ICD-10-CM | POA: Diagnosis present

## 2021-03-25 NOTE — Telephone Encounter (Signed)
I spoke with Hinton Dyer with [facility] and advised her that patient picc line was removed here in the office today. Hinton Dyer also informed that patient will need to take Amoxicillin 500 mg TID x 10 days per Dr. Juleen China. Hinton Dyer verbalized understanding. Guinevere Stephenson T Brooks Sailors

## 2021-03-25 NOTE — Assessment & Plan Note (Signed)
Patient has completed 17 days of IV antibiotics from the date of her washout procedure with orthopedic surgery.  She appears to be doing well and overall recovering.  There does not appear to be any evidence of worsening infection at this time.  Will plan to discontinue her IV antibiotics at this time and remove her PICC line.  We will give her an additional 10 days of oral amoxicillin 500 mg 3 times daily to complete a 4-week course of therapy from the date of her incision and drainage (End date = 04/04/21) given the complexity of her infection and recent right hip arthroplasty.  RTC as needed.

## 2021-03-25 NOTE — Progress Notes (Signed)
[place] for Infectious Disease  Reason for Consult: Hospital follow-up for left hip abscess  Referring Provider: Dr. Lyla Glassing   HPI:    Melinda Kerr is a 86 y.o. female with PMHx as below who presents to the clinic for left hip abscess.   Patient was recently hospitalized at [facility] from 03/04/2021 to 03/13/2021 for a left hip abscess in the setting of recent hip arthroplasty on 02/05/2021.  Melinda Kerr had an abscess that occurred over the site of a prior skin breakdown from adhesive tape, but not at the site of her prior surgical incision.  This had been treated with outpatient antibiotics but did not fully resolve.  Melinda Kerr was subsequently admitted and underwent IR guided drain placement on 6/10 into the fluid collection with cultures yielding Proteus mirabilis.  Melinda Kerr subsequently had a formal incision and drainage on 03/08/2021 with Dr. Lyla Glassing.  These cultures also grew Proteus.  In discussion with orthopedics, they noted that the I&D was carried out through her original incision site from her hip arthroplasty.  This was done in order to reach the area of abscess without creating a second incision site.  A wound VAC was placed at the time of her I&D as well as a JP drain.  Melinda Kerr was treated with cefazolin and did well during her hospitalization and a PICC line was placed for IV antimicrobial therapy after discussing with her surgeon.  The abscess was felt to be contained to the skin and subcutaneous tissue and did not communicate with the left hip joint space and the fascial suture was intact at the time of her surgery.  Melinda Kerr was discharged to complete 2 weeks of IV antibiotics from her washout procedure through 03/22/2021.  However, Melinda Kerr missed her follow-up appointment on 6/23 with Dr. Megan Salon and her IV antibiotics were extended through today.  Melinda Kerr presents today with her niece who states that her left hip area looks much better than previous.  Melinda Kerr saw her orthopedic surgeon on Friday.    Patient's Medications  New Prescriptions   No medications on file  Previous Medications   ALBUTEROL (VENTOLIN HFA) 108 (90 BASE) MCG/ACT INHALER    Inhale 2 puffs into the lungs every 8 (eight) hours as needed for wheezing.   ALLOPURINOL (ZYLOPRIM) 300 MG TABLET    Take 300 mg by mouth daily.    BACLOFEN (LIORESAL) 10 MG TABLET    Take 1 tablet (10 mg total) by mouth 3 (three) times daily as needed for muscle spasms.   CALCIUM CARBONATE (TUMS - DOSED IN MG ELEMENTAL CALCIUM) 500 MG CHEWABLE TABLET    Chew 1 tablet by mouth daily.   CITALOPRAM (CELEXA) 10 MG TABLET    Take 10 mg by mouth daily.   DICLOFENAC SODIUM (VOLTAREN) 1 % GEL    Apply 2 g topically every 12 (twelve) hours as needed (pain).   DOCUSATE SODIUM (COLACE) 100 MG CAPSULE    Take 1 capsule (100 mg total) by mouth 2 (two) times daily.   FUROSEMIDE (LASIX) 40 MG TABLET    Take 0.5 tablets (20 mg total) by mouth daily.   HYDROXYZINE (ATARAX/VISTARIL) 25 MG TABLET    Take 25 mg by mouth at bedtime.   LEVOTHYROXINE (SYNTHROID, LEVOTHROID) 125 MCG TABLET    Take 125 mcg by mouth daily before breakfast.   METOCLOPRAMIDE (REGLAN) 5 MG TABLET    Take 5 mg by mouth 2 (two) times daily at 8 am and 10 pm.  NYSTATIN (MYCOSTATIN/NYSTOP) POWDER    Apply 1 Bottle topically See admin instructions. Apply topically to abdominal skin folds and under bilateral breast for intertrigo - twice daily - please make sure skin has been cleaned and is dry before applying   OXYCODONE (OXY IR/ROXICODONE) 5 MG IMMEDIATE RELEASE TABLET    Take 5 mg by mouth See admin instructions. Take 5 mg by mouth every four hours while awake and an additional 5 mg every four hours as needed for severe pain   OXYGEN    Inhale 2 L/min into the lungs See admin instructions. 2 L/min every shift for shortness of breath and every 8 hours as needed to maintain sats of at least 90%   PANTOPRAZOLE (PROTONIX) 40 MG TABLET    Take 1 tablet (40 mg total) by mouth 2 (two) times daily  before a meal.   POLYETHYLENE GLYCOL (MIRALAX / GLYCOLAX) PACKET    Take 17 g by mouth daily as needed for mild constipation or moderate constipation.   POLYSACCHARIDE-IRON COMPLEX 150 MG CAPS    Take 150 mg by mouth in the morning.   SENNA-DOCUSATE (SENOKOT-S) 8.6-50 MG TABLET    Take 1 tablet by mouth daily.   SIMETHICONE 250 MG CAPS    Take 250 mg by mouth every 8 (eight) hours as needed (gas).   SPIRONOLACTONE (ALDACTONE) 25 MG TABLET    Take 25 mg by mouth daily.   VITAMIN D, ERGOCALCIFEROL, (DRISDOL) 1.25 MG (50000 UT) CAPS CAPSULE    Take 50,000 Units by mouth every Monday.  Modified Medications   No medications on file  Discontinued Medications   No medications on file      Past Medical History:  Diagnosis Date   Arthritis    "shoulders, knees, lower back" (07/14/2017)   CHF (congestive heart failure) ([place])    "while in hospital w/hysterectomy in 2017"   Chronic lower back pain    Depression    Diverticulitis of large intestine with abscess 07/13/2017   DVT (deep venous thrombosis) (HCC)    Dyspnea    "cause I'm over weight"   Esophageal reflux    Gout    Hyperlipidemia    under control   Hypertension    Hypothyroidism    Morbid obesity with BMI of 40.0-44.9, adult ([place])    Nocturia    OSA on CPAP    "not wearing it when I sleep in my lift chair" (07/14/2017)   Osteoarthritis    Phlebitis    hx of   Pneumonia ?1989   Spondylosis    with scoliosis   Type II diabetes mellitus (HCC)    borderline , diet controlled    Urinary frequency    Uterine cancer (HCC)    S/P chemo, radiation, hysterectomy    Social History   Tobacco Use   Smoking status: Never   Smokeless tobacco: Never  Vaping Use   Vaping Use: Never used  Substance Use Topics   Alcohol use: No   Drug use: No    Family History  Problem Relation Age of Onset   Heart disease Mother    Heart disease Father    CVA Father     Allergies  Allergen Reactions   Oxybutynin Other (See Comments)     Bleeding gums, felt sick    Review of Systems  Constitutional:  Negative for chills and fever.  Cardiovascular: Negative.   Gastrointestinal: Negative.   Musculoskeletal:  Negative for joint pain.     OBJECTIVE:  Vitals:   03/25/21 1452  BP: 109/65  Pulse: 66  Temp: 98.4 F (36.9 C)  TempSrc: Oral     There is no height or weight on file to calculate BMI.  Physical Exam Constitutional:      Comments: Lying in stretcher, no acute distress.   Pulmonary:     Effort: Pulmonary effort is normal. No respiratory distress.  Musculoskeletal:     Comments: Left UE picc in place.  Skin:    General: Skin is warm and dry.  Neurological:     General: No focal deficit present.     Mental Status: Melinda Kerr is oriented to person, place, and time.  Psychiatric:        Mood and Affect: Mood normal.        Behavior: Behavior normal.     Labs and Microbiology:  CBC Latest Ref Rng & Units 03/13/2021 03/10/2021 03/09/2021  WBC 4.0 - 10.5 K/uL 9.2 11.4(H) 13.2(H)  Hemoglobin 12.0 - 15.0 g/dL 10.1(L) 9.9(L) 10.1(L)  Hematocrit 36.0 - 46.0 % 32.4(L) 32.9(L) 32.7(L)  Platelets 150 - 400 K/uL 384 382 432(H)   CMP Latest Ref Rng & Units 03/13/2021 03/10/2021 03/09/2021  Glucose 70 - 99 mg/dL 146(H) 173(H) 207(H)  BUN 8 - 23 mg/dL 14 15 9   Creatinine 0.44 - 1.00 mg/dL 0.68 0.88 0.75  Sodium 135 - 145 mmol/L 138 136 135  Potassium 3.5 - 5.1 mmol/L 4.2 4.0 3.9  Chloride 98 - 111 mmol/L 100 101 100  CO2 22 - 32 mmol/L 35(H) 29 30  Calcium 8.9 - 10.3 mg/dL 9.4 9.4 9.2  Total Protein 6.5 - 8.1 g/dL - - -  Total Bilirubin 0.3 - 1.2 mg/dL - - -  Alkaline Phos 38 - 126 U/L - - -  AST 15 - 41 U/L - - -  ALT 0 - 44 U/L - - -        ASSESSMENT & PLAN:    Abscess of hip, left Patient has completed 17 days of IV antibiotics from the date of her washout procedure with orthopedic surgery.  Melinda Kerr appears to be doing well and overall recovering.  There does not appear to be any evidence of worsening  infection at this time.  Will plan to discontinue her IV antibiotics at this time and remove her PICC line.  We will give her an additional 10 days of oral amoxicillin 500 mg 3 times daily to complete a 4-week course of therapy from the date of her incision and drainage (End date = 04/04/21) given the complexity of her infection and recent right hip arthroplasty.  RTC as needed.      Raynelle Highland for Infectious Disease [facility] 03/25/2021, 3:18 PM

## 2021-03-25 NOTE — Progress Notes (Signed)
Per verbal order from Dr. Juleen China, 46 cm PICC removed from left basilic, tip intact. No sutures present. RN confirmed length per chart. Dressing was clean and dry. Petroleum dressing applied. Patient advised no heavy lifting with this arm and to leave dressing in place for 24 hours and not to shower affected arm for 24 hours. Advised patient to call office or seek emergency medical care if dressing becomes soaked with blood, swelling, or sharp pain presents. Advised patient to seek emergent care if develops neurological symptoms, chest pain, or shortness of breath. Patient verbalized understanding and agreement. RN notified Hinton Dyer at [employer] of removal. Darel Hong to keep dressing on for 24 hours and to avoid getting it wet.   Beryle Flock, RN

## 2021-03-25 NOTE — Patient Instructions (Addendum)
Discontinue cefazolin today and remove PICC line.  Start amoxicillin 500mg  three times daily for 10 days.  End date = 04/04/21  Continue to follow up with orthopedic surgery as needed  Continue wound care  Follow up with our clinic as needed   Raynelle Highland for Infectious [facility] 03/25/2021, 3:14 PM

## 2022-06-24 ENCOUNTER — Encounter: Payer: Self-pay | Admitting: Physician Assistant

## 2022-07-23 NOTE — Progress Notes (Signed)
07/24/2022 Melinda Kerr [id number] 1935/07/26  Referring provider: Maury Dus, MD Primary GI doctor: Dr. Bryan Lemma (Dr. Cristina Gong 2019)  ASSESSMENT AND PLAN: 87 year old female multiple comorbidities longstanding history of IDA dating back to at least 2015 baseline appears to be between 67 and 11 since June 2022.  Most recent hemoglobin 9.9 and 9.3 in September and October at [facility].  FOBT positive CT abdomen and pelvis 2019 no acute process, large hiatal hernia EGD 2020 in the hospital for hematemesis large hiatal hernia Lysbeth Galas lesions No overt GI bleeding, is on iron once daily We will check labs today with iron, B12, CBC to see where patient is at currently.  See if we can increase iron or continue supportive care. Most likely with patient's history and no overt GI bleeding and GERD this is likely related to large hiatal hernia with Lysbeth Galas lesions, had recent CT abdomen and pelvis 2019 without evidence of cancer. Due to patient is multiple comorbidities,, respiratory failure on hypoxia with oxygen use and age patient prefers not to have endoscopic evaluation. We will maximize medical therapy, increase Protonix to twice daily, add on Carafate 3 times daily. Check labs for anemia especially iron and B12. Based on these labs will decide if patient would benefit from IV iron. Pending labs and further recommendations from Dr. Bryan Lemma can consider CT abdomen pelvis and chest to evaluate further.  Gerd with hiatal hernia EGD 2020 Lysbeth Galas lesions Most likely this is a source of GI bleeding, maximize medical therapy increase from tonics twice daily, Carafate 3 times daily, close follow-up.  Loose stools Once a day, chronic, no hematochezia, no abdominal pain. Increase fiber add on Carafate Consider CT abdomen pelvis, stool studies but not happening frequently enough. Likely component of pelvic floor  Multiple comorbid conditions Chronic diastolic CHF (congestive heart  failure) (HCC) Morbid obesity (HCC) Chronic respiratory failure with hypoxia (HCC)  On 3 L nasal cannula, increased work of breathing while here Obstructive sleep apnea on CPAP  Patient is not a good candidate for endoscopic evaluation, risk outweigh benefit.  We will do conservative management with most likely source of bleeding which is Lysbeth Galas lesions, if patient does not respond, starts to have overt GI bleeding, transfusion dependent anemia, can consider hospitalization with endoscopic evaluation.     Patient Care Team: Maury Dus, MD as PCP - General (Family Medicine)  HISTORY OF PRESENT ILLNESS: 87 y.o. female with a past medical history of history of DVT, grade 2 diastolic dysfunction, EF 60 to 65%, mildly elevated pulmonary artery systolic pressure, no AS on echo 02/04/2021, hyperlipidemia, hypertension, hypothyroidism, morbid obesity, OSA on CPAP, type 2 diabetes, history of uterine cancer status postchemotherapy radiation and hysterectomy, history of GERD, diverticulosis, constipation and others listed below presents for evaluation of anemia.   Used to follow-up with [facility], Dr. Cristina Gong. 10/2018 hospitalization for hematemesis requiring 2 PRBCs with EGD showing large hiatal hernia with multiple cameron lesions, normal esophagus/duodenum, no Biopsys'  Patient appears to have had intermittent anemia since 2015.   Since 03/06/2021 patient's had hemoglobin 10-11. At [facility]  06/15/2022 HGB was 9.9 with MCV 79.2       07/20/2022 HGB 9.3 with MCV 77.7 She had positive FOBT with [facility] but no overt bleeding.  She has BM once a day, loose with a lot of gas, or up to 3-4 x a day, never formed, has AB pain.  Denies black stool, blood in her stool but she does not see it, has adult diapers changed.  Denies GERD. She states if she eats something that upsets her stomach she has GERD. No dysphagia. She is on low dose ASA, she is on protonix once a day. She is on an iron tablet '150mg'$  once a day  since June.  Has lost about 30 lbs over 3 years at [facility] but states this is from not eating sweets.  09/2017 lower AB pain CT AB pelvis with contrast showed no acute findings, colonic diverticulosis without diverticulitis, large hiatal hernia, aortic atherosclerosis  Memory is not as good, in wheel chair with O2 [place] 3 L, has niece [name] with her.  She is at [facility] x 3 years, states she has had a lot of SOB lately.   She  reports that she has never smoked. She has never used smokeless tobacco. She reports that she does not drink alcohol and does not use drugs.  Current Medications:   Current Outpatient Medications (Endocrine & Metabolic):    levothyroxine (SYNTHROID, LEVOTHROID) 125 MCG tablet, Take 125 mcg by mouth daily before breakfast.  Current Outpatient Medications (Cardiovascular):    furosemide (LASIX) 40 MG tablet, Take 0.5 tablets (20 mg total) by mouth daily.   spironolactone (ALDACTONE) 25 MG tablet, Take 25 mg by mouth daily.  Current Outpatient Medications (Respiratory):    albuterol (VENTOLIN HFA) 108 (90 Base) MCG/ACT inhaler, Inhale 2 puffs into the lungs every 8 (eight) hours as needed for wheezing.  Current Outpatient Medications (Analgesics):    allopurinol (ZYLOPRIM) 300 MG tablet, Take 300 mg by mouth daily.    oxyCODONE (OXY IR/ROXICODONE) 5 MG immediate release tablet, Take 5 mg by mouth See admin instructions. Take 5 mg by mouth every four hours while awake and an additional 5 mg every four hours as needed for severe pain  Current Outpatient Medications (Hematological):    Polysaccharide-Iron Complex 150 MG CAPS, Take 150 mg by mouth in the morning.  Current Outpatient Medications (Other):    baclofen (LIORESAL) 10 MG tablet, Take 1 tablet (10 mg total) by mouth 3 (three) times daily as needed for muscle spasms.   calcium carbonate (TUMS - DOSED IN MG ELEMENTAL CALCIUM) 500 MG chewable tablet, Chew 1 tablet by mouth daily.   citalopram (CELEXA) 10 MG tablet, Take  10 mg by mouth daily.   diclofenac Sodium (VOLTAREN) 1 % GEL, Apply 2 g topically every 12 (twelve) hours as needed (pain).   docusate sodium (COLACE) 100 MG capsule, Take 1 capsule (100 mg total) by mouth 2 (two) times daily.   DULoxetine HCl 30 MG CSDR, Take by mouth.   hydrOXYzine (ATARAX/VISTARIL) 25 MG tablet, Take 25 mg by mouth at bedtime.   metoCLOPramide (REGLAN) 5 MG tablet, Take 5 mg by mouth 2 (two) times daily at 8 am and 10 pm.   nystatin (MYCOSTATIN/NYSTOP) powder, Apply 1 Bottle topically See admin instructions. Apply topically to abdominal skin folds and under bilateral breast for intertrigo - twice daily - please make sure skin has been cleaned and is dry before applying   OXYGEN, Inhale 2 L/min into the lungs See admin instructions. 2 L/min every shift for shortness of breath and every 8 hours as needed to maintain sats of at least 90%   pantoprazole (PROTONIX) 40 MG tablet, Take 1 tablet (40 mg total) by mouth 2 (two) times daily before a meal.   polyethylene glycol (MIRALAX / GLYCOLAX) packet, Take 17 g by mouth daily as needed for mild constipation or moderate constipation.   senna-docusate (SENOKOT-S) 8.6-50 MG tablet, Take  1 tablet by mouth daily.   Simethicone 250 MG CAPS, Take 250 mg by mouth every 8 (eight) hours as needed (gas).   sucralfate (CARAFATE) 1 g tablet, Take 1 tablet (1 g total) by mouth 4 (four) times daily -  with meals and at bedtime.   Vitamin D, Ergocalciferol, (DRISDOL) 1.25 MG (50000 UT) CAPS capsule, Take 50,000 Units by mouth every Monday.  Medical History:  Past Medical History:  Diagnosis Date   Arthritis    "shoulders, knees, lower back" (07/14/2017)   CHF (congestive heart failure) ([place])    "while in hospital w/hysterectomy in 2017"   Chronic lower back pain    Depression    Diverticulitis of large intestine with abscess 07/13/2017   DVT (deep venous thrombosis) (HCC)    Dyspnea    "cause I'm over weight"   Esophageal reflux    Gout     Hiatal hernia    Hyperlipidemia    under control   Hypertension    Hypothyroidism    Morbid obesity with BMI of 40.0-44.9, adult ([place])    Nocturia    OSA on CPAP    "not wearing it when I sleep in my lift chair" (07/14/2017)   Osteoarthritis    Phlebitis    hx of   Pneumonia ?1989   Spondylosis    with scoliosis   Type II diabetes mellitus (HCC)    borderline , diet controlled    Urinary frequency    Uterine cancer (HCC)    S/P chemo, radiation, hysterectomy   Allergies:  Allergies  Allergen Reactions   Oxybutynin Other (See Comments)    Bleeding gums, felt sick     Surgical History:  She  has a past surgical history that includes Varicose vein surgery (Bilateral, 1968); Wisdom tooth extraction (1963); Total knee arthroplasty (Left, 10/09/2013); Total knee arthroplasty (Right, 04/09/2014); Dilatation & curettage/hysteroscopy with myosure (N/A, 03/04/2016); Robotic assisted total hysterectomy with bilateral salpingo oophorectomy (N/A, 03/26/2016); Lymph node biopsy (N/A, 03/26/2016); ir generic historical (05/15/2016); ir generic historical (05/15/2016); Cataract extraction, bilateral (Bilateral, 2018); Laparoscopic cholecystectomy (2004); Hernia repair; Laparoscopic incisional / umbilical / ventral hernia repair (2004); Appendectomy; Tubal ligation (1982); IR REMOVAL TUN ACCESS W/ PORT W/O FL MOD SED (04/19/2018); Colonoscopy; Reverse shoulder arthroplasty (Right, 10/26/2018); Esophagogastroduodenoscopy (egd) with propofol (N/A, 11/14/2018); Femur IM nail (Right, 04/09/2019); ORIF femur fracture (Right, 05/09/2020); Hardware Removal (Right, 05/09/2020); Anterior approach hemi hip arthroplasty (Left, 02/05/2021); I & D extremity (Left, 9/48/5462); and Application if wound vac (Left, 03/08/2021). Family History:  Her family history includes CVA in her father; Heart disease in her father and mother.  REVIEW OF SYSTEMS  : All other systems reviewed and negative except where noted in the History of  Present Illness.  PHYSICAL EXAM: BP 126/80   Pulse 74   Ht '5\' 5"'$  (1.651 m)   Wt 225 lb (102.1 kg)   BMI 37.44 kg/m  General:   Obese, chronically ill-appearing female in a wheelchair Head:   Normocephalic and atraumatic. Eyes:  sclerae anicteric,conjunctive pink  Heart:   regular rate and rhythm, holosystolic murmur Pulm: Diffuse wheezing, 3 L nasal cannula, increased work of breathing Abdomen:   Soft, Obese AB, Sluggish bowel sounds. No tenderness , No organomegaly appreciated due to body habitus. Rectal: Not evaluated Extremities:  Without edema. Msk: Symmetrical without gross deformities. Peripheral pulses intact.  Neurologic:  Alert and  oriented x4;  No focal deficits.  Skin:   Dry and intact without significant lesions or rashes. Psychiatric:  Cooperative. Normal mood and affect.    Vladimir Crofts, PA-C 12:32 PM

## 2022-07-24 ENCOUNTER — Ambulatory Visit (INDEPENDENT_AMBULATORY_CARE_PROVIDER_SITE_OTHER): Payer: Medicare Other | Admitting: Physician Assistant

## 2022-07-24 ENCOUNTER — Other Ambulatory Visit (INDEPENDENT_AMBULATORY_CARE_PROVIDER_SITE_OTHER): Payer: Medicare Other

## 2022-07-24 ENCOUNTER — Other Ambulatory Visit: Payer: Self-pay

## 2022-07-24 ENCOUNTER — Encounter: Payer: Self-pay | Admitting: *Deleted

## 2022-07-24 VITALS — BP 126/80 | HR 74 | Ht 65.0 in | Wt 225.0 lb

## 2022-07-24 DIAGNOSIS — D509 Iron deficiency anemia, unspecified: Secondary | ICD-10-CM

## 2022-07-24 DIAGNOSIS — K449 Diaphragmatic hernia without obstruction or gangrene: Secondary | ICD-10-CM

## 2022-07-24 DIAGNOSIS — R195 Other fecal abnormalities: Secondary | ICD-10-CM | POA: Diagnosis not present

## 2022-07-24 DIAGNOSIS — I5032 Chronic diastolic (congestive) heart failure: Secondary | ICD-10-CM

## 2022-07-24 DIAGNOSIS — J9611 Chronic respiratory failure with hypoxia: Secondary | ICD-10-CM

## 2022-07-24 DIAGNOSIS — K922 Gastrointestinal hemorrhage, unspecified: Secondary | ICD-10-CM

## 2022-07-24 DIAGNOSIS — G4733 Obstructive sleep apnea (adult) (pediatric): Secondary | ICD-10-CM

## 2022-07-24 LAB — VITAMIN B12: Vitamin B-12: 355 pg/mL (ref 211–911)

## 2022-07-24 LAB — COMPREHENSIVE METABOLIC PANEL
ALT: 10 U/L (ref 0–35)
AST: 13 U/L (ref 0–37)
Albumin: 3.6 g/dL (ref 3.5–5.2)
Alkaline Phosphatase: 101 U/L (ref 39–117)
BUN: 20 mg/dL (ref 6–23)
CO2: 34 mEq/L — ABNORMAL HIGH (ref 19–32)
Calcium: 10.3 mg/dL (ref 8.4–10.5)
Chloride: 95 mEq/L — ABNORMAL LOW (ref 96–112)
Creatinine, Ser: 0.64 mg/dL (ref 0.40–1.20)
GFR: 80.55 mL/min (ref >60.00)
Glucose, Bld: 267 mg/dL — ABNORMAL HIGH (ref 70–99)
Potassium: 3.9 mEq/L (ref 3.5–5.1)
Sodium: 137 mEq/L (ref 135–145)
Total Bilirubin: 0.3 mg/dL (ref 0.2–1.2)
Total Protein: 6.4 g/dL (ref 6.0–8.3)

## 2022-07-24 LAB — CBC WITH DIFFERENTIAL/PLATELET
Basophils Absolute: 0 10*3/uL (ref 0.0–0.1)
Basophils Relative: 0.4 % (ref 0.0–3.0)
Eosinophils Absolute: 0.1 10*3/uL (ref 0.0–0.7)
Eosinophils Relative: 0.8 % (ref 0.0–5.0)
HCT: 31.8 % — ABNORMAL LOW (ref 36.0–46.0)
Hemoglobin: 9.7 g/dL — ABNORMAL LOW (ref 12.0–15.0)
Lymphocytes Relative: 11.9 % — ABNORMAL LOW (ref 12.0–46.0)
Lymphs Abs: 1.3 10*3/uL (ref 0.7–4.0)
MCHC: 30.4 g/dL (ref 30.0–36.0)
MCV: 76.9 fl — ABNORMAL LOW (ref 78.0–100.0)
Monocytes Absolute: 0.8 10*3/uL (ref 0.1–1.0)
Monocytes Relative: 7.8 % (ref 3.0–12.0)
Neutro Abs: 8.5 10*3/uL — ABNORMAL HIGH (ref 1.4–7.7)
Neutrophils Relative %: 79.1 % — ABNORMAL HIGH (ref 43.0–77.0)
Platelets: 413 10*3/uL — ABNORMAL HIGH (ref 150.0–400.0)
RBC: 4.13 Mil/uL (ref 3.87–5.11)
RDW: 17.3 % — ABNORMAL HIGH (ref 11.5–15.5)
WBC: 10.7 10*3/uL — ABNORMAL HIGH (ref 4.0–10.5)

## 2022-07-24 LAB — IBC + FERRITIN
Ferritin: 6.2 ng/mL — ABNORMAL LOW (ref 10.0–291.0)
Iron: 21 ug/dL — ABNORMAL LOW (ref 42–145)
Saturation Ratios: 4.3 % — ABNORMAL LOW (ref 20.0–50.0)
TIBC: 488.6 ug/dL — ABNORMAL HIGH (ref 250.0–450.0)
Transferrin: 349 mg/dL (ref 212.0–360.0)

## 2022-07-24 MED ORDER — PANTOPRAZOLE SODIUM 40 MG PO TBEC: 40.0000 mg | DELAYED_RELEASE_TABLET | Freq: Two times a day (BID) | ORAL | 0 refills | Status: AC

## 2022-07-24 MED ORDER — SUCRALFATE 1 G PO TABS: 1.0000 g | ORAL_TABLET | Freq: Three times a day (TID) | ORAL | 0 refills | Status: DC

## 2022-07-24 NOTE — Patient Instructions (Addendum)
Please take your proton pump inhibitor medication, increase protonix to 40 mg twice a day Add on carafate up to 3 x a day, if too big can mix with water and drink  Please take this medication 30 minutes to 1 hour before meals- this makes it more effective.  Avoid spicy and acidic foods Avoid fatty foods Limit your intake of coffee, tea, alcohol, and carbonated drinks Work to maintain a healthy weight Keep the head of the bed elevated at least 3 inches with blocks or a wedge pillow if you are having any nighttime symptoms Stay upright for 2 hours after eating Avoid meals and snacks three to four hours before bedtime  Go to the ER if you have any severe weakness, severe AB pain, vomit blood, dark red blood in your bowel movement, shortenss of breath or chest pain.   Your provider has requested that you go to the basement level for lab work before leaving today. Press "B" on the elevator. The lab is located at the first door on the left as you exit the elevator.  Please follow up with Vicie Mutters, PA-C or Dr Gerrit Heck in 2 months.

## 2022-07-29 NOTE — Progress Notes (Signed)
Agree with the assessment and plan as outlined by Vicie Mutters, PA-C.  Agree that she has high likelihood for recurrent Cameron ulcers causing occult bleeding and anemia.  Given significant underlying comorbidities and patient preference, agree with plan to proceed with more conservative measures rather than repeat endoscopy at this juncture.  Plan for labs and maximizing medical management as outlined to start.  If require endoscopic procedures, these would need to be scheduled in the hospital setting due to elevated periprocedural risks with significant underlying comorbidities.  Masoud Nyce, DO, [facility]

## 2022-08-20 ENCOUNTER — Inpatient Hospital Stay (HOSPITAL_COMMUNITY): Payer: Medicare Other

## 2022-08-20 ENCOUNTER — Emergency Department (HOSPITAL_COMMUNITY): Payer: Medicare Other

## 2022-08-20 ENCOUNTER — Inpatient Hospital Stay (HOSPITAL_COMMUNITY)
Admission: EM | Admit: 2022-08-20 | Discharge: 2022-08-31 | DRG: 480 | Disposition: A | Discharge status: Skilled Nursing Facility | Payer: Medicare Other | Source: Skilled Nursing Facility | Attending: Internal Medicine | Admitting: Internal Medicine

## 2022-08-20 ENCOUNTER — Other Ambulatory Visit: Payer: Self-pay

## 2022-08-20 ENCOUNTER — Encounter (HOSPITAL_COMMUNITY): Payer: Self-pay

## 2022-08-20 DIAGNOSIS — Z96651 Presence of right artificial knee joint: Secondary | ICD-10-CM | POA: Diagnosis present

## 2022-08-20 DIAGNOSIS — E038 Other specified hypothyroidism: Secondary | ICD-10-CM

## 2022-08-20 DIAGNOSIS — I5033 Acute on chronic diastolic (congestive) heart failure: Secondary | ICD-10-CM | POA: Diagnosis not present

## 2022-08-20 DIAGNOSIS — Z9981 Dependence on supplemental oxygen: Secondary | ICD-10-CM

## 2022-08-20 DIAGNOSIS — Z8673 Personal history of transient ischemic attack (TIA), and cerebral infarction without residual deficits: Secondary | ICD-10-CM | POA: Diagnosis not present

## 2022-08-20 DIAGNOSIS — Z8249 Family history of ischemic heart disease and other diseases of the circulatory system: Secondary | ICD-10-CM | POA: Diagnosis not present

## 2022-08-20 DIAGNOSIS — F0390 Unspecified dementia without behavioral disturbance: Secondary | ICD-10-CM | POA: Diagnosis present

## 2022-08-20 DIAGNOSIS — E873 Alkalosis: Secondary | ICD-10-CM | POA: Diagnosis present

## 2022-08-20 DIAGNOSIS — M9712XA Periprosthetic fracture around internal prosthetic left knee joint, initial encounter: Secondary | ICD-10-CM | POA: Diagnosis present

## 2022-08-20 DIAGNOSIS — E669 Obesity, unspecified: Secondary | ICD-10-CM | POA: Diagnosis not present

## 2022-08-20 DIAGNOSIS — Z7401 Bed confinement status: Secondary | ICD-10-CM

## 2022-08-20 DIAGNOSIS — R339 Retention of urine, unspecified: Secondary | ICD-10-CM | POA: Diagnosis present

## 2022-08-20 DIAGNOSIS — Z9221 Personal history of antineoplastic chemotherapy: Secondary | ICD-10-CM

## 2022-08-20 DIAGNOSIS — D72829 Elevated white blood cell count, unspecified: Secondary | ICD-10-CM | POA: Diagnosis present

## 2022-08-20 DIAGNOSIS — S72402A Unspecified fracture of lower end of left femur, initial encounter for closed fracture: Secondary | ICD-10-CM

## 2022-08-20 DIAGNOSIS — I5032 Chronic diastolic (congestive) heart failure: Secondary | ICD-10-CM | POA: Diagnosis present

## 2022-08-20 DIAGNOSIS — S82191A Other fracture of upper end of right tibia, initial encounter for closed fracture: Secondary | ICD-10-CM | POA: Diagnosis present

## 2022-08-20 DIAGNOSIS — Z823 Family history of stroke: Secondary | ICD-10-CM

## 2022-08-20 DIAGNOSIS — Y92122 Bedroom in nursing home as the place of occurrence of the external cause: Secondary | ICD-10-CM

## 2022-08-20 DIAGNOSIS — M9711XA Periprosthetic fracture around internal prosthetic right knee joint, initial encounter: Secondary | ICD-10-CM | POA: Diagnosis present

## 2022-08-20 DIAGNOSIS — D62 Acute posthemorrhagic anemia: Secondary | ICD-10-CM | POA: Diagnosis present

## 2022-08-20 DIAGNOSIS — Z96642 Presence of left artificial hip joint: Secondary | ICD-10-CM | POA: Diagnosis present

## 2022-08-20 DIAGNOSIS — I4891 Unspecified atrial fibrillation: Secondary | ICD-10-CM | POA: Diagnosis not present

## 2022-08-20 DIAGNOSIS — Z9049 Acquired absence of other specified parts of digestive tract: Secondary | ICD-10-CM

## 2022-08-20 DIAGNOSIS — Z6837 Body mass index (BMI) 37.0-37.9, adult: Secondary | ICD-10-CM

## 2022-08-20 DIAGNOSIS — K219 Gastro-esophageal reflux disease without esophagitis: Secondary | ICD-10-CM | POA: Diagnosis present

## 2022-08-20 DIAGNOSIS — E119 Type 2 diabetes mellitus without complications: Secondary | ICD-10-CM | POA: Diagnosis present

## 2022-08-20 DIAGNOSIS — M25552 Pain in left hip: Secondary | ICD-10-CM | POA: Diagnosis present

## 2022-08-20 DIAGNOSIS — D509 Iron deficiency anemia, unspecified: Secondary | ICD-10-CM | POA: Diagnosis present

## 2022-08-20 DIAGNOSIS — S82401A Unspecified fracture of shaft of right fibula, initial encounter for closed fracture: Secondary | ICD-10-CM | POA: Diagnosis present

## 2022-08-20 DIAGNOSIS — D649 Anemia, unspecified: Secondary | ICD-10-CM

## 2022-08-20 DIAGNOSIS — W19XXXA Unspecified fall, initial encounter: Secondary | ICD-10-CM | POA: Diagnosis not present

## 2022-08-20 DIAGNOSIS — G4733 Obstructive sleep apnea (adult) (pediatric): Secondary | ICD-10-CM | POA: Diagnosis present

## 2022-08-20 DIAGNOSIS — S72352A Displaced comminuted fracture of shaft of left femur, initial encounter for closed fracture: Principal | ICD-10-CM | POA: Diagnosis present

## 2022-08-20 DIAGNOSIS — J9610 Chronic respiratory failure, unspecified whether with hypoxia or hypercapnia: Secondary | ICD-10-CM | POA: Diagnosis not present

## 2022-08-20 DIAGNOSIS — K5289 Other specified noninfective gastroenteritis and colitis: Secondary | ICD-10-CM | POA: Diagnosis present

## 2022-08-20 DIAGNOSIS — Z90710 Acquired absence of both cervix and uterus: Secondary | ICD-10-CM

## 2022-08-20 DIAGNOSIS — I110 Hypertensive heart disease with heart failure: Secondary | ICD-10-CM | POA: Diagnosis present

## 2022-08-20 DIAGNOSIS — D75839 Thrombocytosis, unspecified: Secondary | ICD-10-CM | POA: Diagnosis present

## 2022-08-20 DIAGNOSIS — M810 Age-related osteoporosis without current pathological fracture: Secondary | ICD-10-CM | POA: Diagnosis present

## 2022-08-20 DIAGNOSIS — E039 Hypothyroidism, unspecified: Secondary | ICD-10-CM | POA: Diagnosis present

## 2022-08-20 DIAGNOSIS — R600 Localized edema: Secondary | ICD-10-CM | POA: Diagnosis not present

## 2022-08-20 DIAGNOSIS — Z79890 Hormone replacement therapy (postmenopausal): Secondary | ICD-10-CM | POA: Diagnosis not present

## 2022-08-20 DIAGNOSIS — Z66 Do not resuscitate: Secondary | ICD-10-CM | POA: Diagnosis present

## 2022-08-20 DIAGNOSIS — W06XXXA Fall from bed, initial encounter: Secondary | ICD-10-CM | POA: Diagnosis present

## 2022-08-20 DIAGNOSIS — I480 Paroxysmal atrial fibrillation: Secondary | ICD-10-CM | POA: Diagnosis present

## 2022-08-20 DIAGNOSIS — Y92129 Unspecified place in nursing home as the place of occurrence of the external cause: Secondary | ICD-10-CM | POA: Diagnosis not present

## 2022-08-20 DIAGNOSIS — Z79899 Other long term (current) drug therapy: Secondary | ICD-10-CM

## 2022-08-20 DIAGNOSIS — K5909 Other constipation: Secondary | ICD-10-CM | POA: Diagnosis not present

## 2022-08-20 DIAGNOSIS — Z9989 Dependence on other enabling machines and devices: Secondary | ICD-10-CM | POA: Diagnosis not present

## 2022-08-20 DIAGNOSIS — E785 Hyperlipidemia, unspecified: Secondary | ICD-10-CM | POA: Diagnosis present

## 2022-08-20 DIAGNOSIS — Z96611 Presence of right artificial shoulder joint: Secondary | ICD-10-CM | POA: Diagnosis present

## 2022-08-20 DIAGNOSIS — Z8542 Personal history of malignant neoplasm of other parts of uterus: Secondary | ICD-10-CM

## 2022-08-20 DIAGNOSIS — Z86718 Personal history of other venous thrombosis and embolism: Secondary | ICD-10-CM

## 2022-08-20 DIAGNOSIS — R338 Other retention of urine: Secondary | ICD-10-CM | POA: Diagnosis not present

## 2022-08-20 LAB — CBC WITH DIFFERENTIAL/PLATELET
Abs Immature Granulocytes: 0.38 10*3/uL — ABNORMAL HIGH (ref 0.00–0.07)
Basophils Absolute: 0.1 10*3/uL (ref 0.0–0.1)
Basophils Relative: 0 %
Eosinophils Absolute: 0 10*3/uL (ref 0.0–0.5)
Eosinophils Relative: 0 %
HCT: 30.4 % — ABNORMAL LOW (ref 36.0–46.0)
Hemoglobin: 8.6 g/dL — ABNORMAL LOW (ref 12.0–15.0)
Immature Granulocytes: 1 %
Lymphocytes Relative: 4 %
Lymphs Abs: 1.2 10*3/uL (ref 0.7–4.0)
MCH: 22.8 pg — ABNORMAL LOW (ref 26.0–34.0)
MCHC: 28.3 g/dL — ABNORMAL LOW (ref 30.0–36.0)
MCV: 80.6 fL (ref 80.0–100.0)
Monocytes Absolute: 1.9 10*3/uL — ABNORMAL HIGH (ref 0.1–1.0)
Monocytes Relative: 6 %
Neutro Abs: 29.5 10*3/uL — ABNORMAL HIGH (ref 1.7–7.7)
Neutrophils Relative %: 89 %
Platelets: 481 10*3/uL — ABNORMAL HIGH (ref 150–400)
RBC: 3.77 MIL/uL — ABNORMAL LOW (ref 3.87–5.11)
RDW: 17.5 % — ABNORMAL HIGH (ref 11.5–15.5)
WBC: 33.1 10*3/uL — ABNORMAL HIGH (ref 4.0–10.5)
nRBC: 0 % (ref 0.0–0.2)

## 2022-08-20 LAB — URINALYSIS, ROUTINE W REFLEX MICROSCOPIC
Bacteria, UA: NONE SEEN
Bilirubin Urine: NEGATIVE
Glucose, UA: NEGATIVE mg/dL
Hgb urine dipstick: NEGATIVE
Ketones, ur: NEGATIVE mg/dL
Nitrite: NEGATIVE
Protein, ur: NEGATIVE mg/dL
Specific Gravity, Urine: 1.016 (ref 1.005–1.030)
pH: 5 (ref 5.0–8.0)

## 2022-08-20 LAB — ECHOCARDIOGRAM COMPLETE
Height: 65 in
S' Lateral: 2.2 cm
Weight: 3601.43 oz

## 2022-08-20 LAB — PROTIME-INR
INR: 1.2 (ref 0.8–1.2)
Prothrombin Time: 15.4 seconds — ABNORMAL HIGH (ref 11.4–15.2)

## 2022-08-20 LAB — SURGICAL PCR SCREEN
MRSA, PCR: POSITIVE — AB
Staphylococcus aureus: POSITIVE — AB

## 2022-08-20 LAB — BASIC METABOLIC PANEL
Anion gap: 7 (ref 5–15)
BUN: 23 mg/dL (ref 8–23)
CO2: 34 mmol/L — ABNORMAL HIGH (ref 22–32)
Calcium: 9.4 mg/dL (ref 8.9–10.3)
Chloride: 99 mmol/L (ref 98–111)
Creatinine, Ser: 0.76 mg/dL (ref 0.44–1.00)
GFR, Estimated: >60 mL/min (ref >60)
Glucose, Bld: 235 mg/dL — ABNORMAL HIGH (ref 70–99)
Potassium: 3.7 mmol/L (ref 3.5–5.1)
Sodium: 140 mmol/L (ref 135–145)

## 2022-08-20 LAB — TSH: TSH: 1.007 u[IU]/mL (ref 0.350–4.500)

## 2022-08-20 LAB — GLUCOSE, CAPILLARY
Glucose-Capillary: 265 mg/dL — ABNORMAL HIGH (ref 70–99)
Glucose-Capillary: 278 mg/dL — ABNORMAL HIGH (ref 70–99)

## 2022-08-20 MED ORDER — CHLORHEXIDINE GLUCONATE CLOTH 2 % EX PADS
6.0000 | MEDICATED_PAD | Freq: Every day | CUTANEOUS | Status: AC
  Administered 2022-08-21 – 2022-08-25 (×5): 6 via TOPICAL

## 2022-08-20 MED ORDER — MUPIROCIN 2 % EX OINT
1.0000 | TOPICAL_OINTMENT | Freq: Two times a day (BID) | CUTANEOUS | Status: AC
  Administered 2022-08-20 – 2022-08-24 (×9): 1 via NASAL
  Filled 2022-08-20: qty 22

## 2022-08-20 MED ORDER — SODIUM CHLORIDE 0.9 % IV SOLN: INTRAVENOUS | Status: DC

## 2022-08-20 MED ORDER — ONDANSETRON HCL 4 MG/2ML IJ SOLN: 4.0000 mg | Freq: Four times a day (QID) | INTRAMUSCULAR | Status: DC | PRN

## 2022-08-20 MED ORDER — PANTOPRAZOLE SODIUM 40 MG PO TBEC
40.0000 mg | DELAYED_RELEASE_TABLET | Freq: Two times a day (BID) | ORAL | Status: DC
  Administered 2022-08-20 – 2022-08-31 (×21): 40 mg via ORAL
  Filled 2022-08-20 (×21): qty 1

## 2022-08-20 MED ORDER — OXYCODONE HCL 5 MG PO TABS
5.0000 mg | ORAL_TABLET | ORAL | Status: DC | PRN
  Administered 2022-08-20 – 2022-08-21 (×3): 5 mg via ORAL
  Administered 2022-08-22 – 2022-08-23 (×2): 10 mg via ORAL
  Administered 2022-08-25: 5 mg via ORAL
  Filled 2022-08-20: qty 2
  Filled 2022-08-20 (×2): qty 1
  Filled 2022-08-20: qty 2
  Filled 2022-08-20 (×2): qty 1

## 2022-08-20 MED ORDER — MORPHINE SULFATE (PF) 2 MG/ML IV SOLN
0.5000 mg | INTRAVENOUS | Status: DC | PRN
  Administered 2022-08-21 – 2022-08-22 (×3): 0.5 mg via INTRAVENOUS
  Filled 2022-08-20 (×3): qty 1

## 2022-08-20 MED ORDER — FUROSEMIDE 20 MG PO TABS
20.0000 mg | ORAL_TABLET | Freq: Every day | ORAL | Status: DC
  Administered 2022-08-20 – 2022-08-25 (×5): 20 mg via ORAL
  Filled 2022-08-20 (×5): qty 1

## 2022-08-20 MED ORDER — INSULIN ASPART 100 UNIT/ML IJ SOLN
0.0000 [IU] | Freq: Three times a day (TID) | INTRAMUSCULAR | Status: DC
  Administered 2022-08-20: 5 [IU] via SUBCUTANEOUS
  Administered 2022-08-21 (×2): 3 [IU] via SUBCUTANEOUS
  Administered 2022-08-21: 5 [IU] via SUBCUTANEOUS
  Administered 2022-08-22 – 2022-08-23 (×3): 3 [IU] via SUBCUTANEOUS
  Administered 2022-08-23: 5 [IU] via SUBCUTANEOUS
  Administered 2022-08-23: 2 [IU] via SUBCUTANEOUS
  Administered 2022-08-24: 1 [IU] via SUBCUTANEOUS
  Administered 2022-08-24: 3 [IU] via SUBCUTANEOUS
  Administered 2022-08-24: 5 [IU] via SUBCUTANEOUS
  Administered 2022-08-25: 2 [IU] via SUBCUTANEOUS
  Administered 2022-08-25: 3 [IU] via SUBCUTANEOUS
  Administered 2022-08-25 – 2022-08-26 (×3): 2 [IU] via SUBCUTANEOUS

## 2022-08-20 MED ORDER — ACETAMINOPHEN 325 MG PO TABS
650.0000 mg | ORAL_TABLET | Freq: Four times a day (QID) | ORAL | Status: DC | PRN
  Administered 2022-08-20: 650 mg via ORAL
  Filled 2022-08-20: qty 2

## 2022-08-20 MED ORDER — METOPROLOL TARTRATE 12.5 MG HALF TABLET
12.5000 mg | ORAL_TABLET | Freq: Two times a day (BID) | ORAL | Status: DC
  Administered 2022-08-20 – 2022-08-25 (×10): 12.5 mg via ORAL
  Filled 2022-08-20 (×11): qty 1

## 2022-08-20 MED ORDER — ADULT MULTIVITAMIN W/MINERALS CH
1.0000 | ORAL_TABLET | Freq: Every day | ORAL | Status: DC
  Administered 2022-08-20 – 2022-08-26 (×6): 1 via ORAL
  Filled 2022-08-20 (×6): qty 1

## 2022-08-20 MED ORDER — GLUCERNA SHAKE PO LIQD
237.0000 mL | Freq: Three times a day (TID) | ORAL | Status: DC
  Administered 2022-08-20 – 2022-08-26 (×17): 237 mL via ORAL
  Filled 2022-08-20 (×20): qty 237

## 2022-08-20 MED ORDER — MORPHINE SULFATE (PF) 4 MG/ML IV SOLN
4.0000 mg | INTRAVENOUS | Status: DC | PRN
  Administered 2022-08-20: 4 mg via INTRAVENOUS
  Filled 2022-08-20: qty 1

## 2022-08-20 MED ORDER — INSULIN ASPART 100 UNIT/ML IJ SOLN
0.0000 [IU] | Freq: Every day | INTRAMUSCULAR | Status: DC
  Administered 2022-08-20: 3 [IU] via SUBCUTANEOUS
  Administered 2022-08-21 – 2022-08-25 (×5): 2 [IU] via SUBCUTANEOUS

## 2022-08-20 MED ORDER — LEVOTHYROXINE SODIUM 125 MCG PO TABS
125.0000 ug | ORAL_TABLET | Freq: Every day | ORAL | Status: DC
  Administered 2022-08-20 – 2022-08-31 (×10): 125 ug via ORAL
  Filled 2022-08-20 (×10): qty 1

## 2022-08-20 NOTE — Assessment & Plan Note (Addendum)
-   A1c 7.5% - continue SSI and CBG monitoring

## 2022-08-20 NOTE — Progress Notes (Signed)
  Echocardiogram 2D Echocardiogram has been performed.  Melinda Kerr 08/20/2022, 10:54 AM

## 2022-08-20 NOTE — Assessment & Plan Note (Addendum)
-   patient does have prior history of afib; noted on EKG on 02/03/21; at the time she had repair of original left femoral fracture; likely had afib precipitated by the acute stress - she's not been on anticoagulation and I agree, risk outweighs benefit given age and bleed risk - rate control strategy will be pursued; still some RVR on admission - TSH normal, 1.007 - continue lopressor - repeat echo now: EF 60-65%, no RWMA, mild LVH, indeterminate diastology (was Gr II DD previously on May 2022 echo); overall her echo is stable and unchanged

## 2022-08-20 NOTE — Assessment & Plan Note (Addendum)
Verified DNR orders from SNF order sheet

## 2022-08-20 NOTE — Assessment & Plan Note (Addendum)
-   Complicates overall prognosis and care - Body mass index is 37.46 kg/m.

## 2022-08-20 NOTE — Hospital Course (Addendum)
Ms. Daus is an 87 yo female with PMH afib, dementia, obesity, chronic dCHF, CVA, DMII who presented after a fall out of the bed. She resides at [facility]. She was brought to the ER after her fall.  X-rays and CT head obtained on admission.  Imaging shows left comminuted displaced distal femoral fracture.  She was admitted for evaluation with orthopedic surgery.  She underwent ORIF on 08/22/2022. On 11/29 found to have a possible sigmoid volvulus as well as a right tibia-fibula fracture.  GI consulted.  Orthopedic was reconsulted.

## 2022-08-20 NOTE — Anesthesia Preprocedure Evaluation (Addendum)
Anesthesia Evaluation  Patient identified by MRN, date of birth, ID band Patient confused    Reviewed: Allergy & Precautions, NPO status , Patient's Chart, lab work & pertinent test results  Airway Mallampati: IV  TM Distance: >3 FB Neck ROM: Full    Dental no notable dental hx. (+) Missing, Caps, Dental Advisory Given   Pulmonary sleep apnea and Continuous Positive Airway Pressure Ventilation    Pulmonary exam normal breath sounds clear to auscultation       Cardiovascular hypertension, + CAD, +CHF and + DVT  Normal cardiovascular exam+ dysrhythmias Atrial Fibrillation  Rhythm:Regular Rate:Normal  08/20/2022  ECHO  1. Left ventricular ejection fraction, by estimation, is 60 to 65%. The  left ventricle has normal function. The left ventricle has no regional  wall motion abnormalities. There is mild concentric left ventricular  hypertrophy. Left ventricular diastolic  parameters are indeterminate.   2. Right ventricular systolic function is normal. The right ventricular  size is normal. There is mildly elevated pulmonary artery systolic  pressure.   3. Left atrial size was moderately dilated.   4. Prominent epicardial fat pad unchanged from 2022 study.   5. The mitral valve is grossly normal. No evidence of mitral valve  regurgitation. No evidence of mitral stenosis.   6. The aortic valve was not well visualized. Aortic valve regurgitation  is not visualized. Aortic valve sclerosis is present, with no evidence of  aortic valve stenosis.   7. The inferior vena cava is normal in size with greater than 50%  respiratory variability, suggesting right atrial pressure of 3 mmHg.      Neuro/Psych  PSYCHIATRIC DISORDERS  Depression   Dementia TIACVA    GI/Hepatic hiatal hernia,GERD  ,,  Endo/Other  diabetes, Type 2Hypothyroidism    Renal/GU Lab Results      Component                Value               Date                       CREATININE               0.76                08/20/2022                    K                        3.7                 08/20/2022                    Musculoskeletal  (+) Arthritis ,    Abdominal  (+) + obese (BMI 37.5)  Peds  Hematology  (+) Blood dyscrasia, anemia Lab Results      Component                Value               Date                      WBC                      16.0 (H)            08/21/2022  HGB                      5.8 (LL)            08/21/2022                HCT                      20.1 (L)            08/21/2022                MCV                      78.8 (L)            08/21/2022                PLT                      282                 08/21/2022                 Anesthesia Other Findings Uterine CA  Reproductive/Obstetrics                             Anesthesia Physical Anesthesia Plan  ASA: 4 and emergent  Anesthesia Plan: General   Post-op Pain Management: Ofirmev IV (intra-op)*   Induction: Intravenous  PONV Risk Score and Plan: Treatment may vary due to age or medical condition  Airway Management Planned: Oral ETT  Additional Equipment: None  Intra-op Plan:   Post-operative Plan: Extubation in OR and Possible Post-op intubation/ventilation  Informed Consent: I have reviewed the patients History and Physical, chart, labs and discussed the procedure including the risks, benefits and alternatives for the proposed anesthesia with the patient or authorized representative who has indicated his/her understanding and acceptance.   Patient has DNR.  Suspend DNR and Discussed DNR with power of attorney.   Dental advisory given and Consent reviewed with POA  Plan Discussed with: CRNA  Anesthesia Plan Comments:         Anesthesia Quick Evaluation

## 2022-08-20 NOTE — Progress Notes (Signed)
Unable to complete the admission assessment due to patient's diminished cognitive ability.  Melinda Kerr

## 2022-08-20 NOTE — Assessment & Plan Note (Addendum)
Stable. Continue synthroid 125 mcg daily - TSH normal

## 2022-08-20 NOTE — Progress Notes (Signed)
Patient with closed L periprosthetic femur fracture after fall overnight. Will need ORIF. Surgery timing today vs tomorrow pending OR availability and medical clearance. Full consult note to follow.

## 2022-08-20 NOTE — Progress Notes (Signed)
   08/20/22 0445  Revised Cardiac Risk Index (RCRI)  High Risk Surgery?  0  History of Ischemic Heart Disease? 0  History of Congestive Heart Failure? 1  History of Cerebrovascular Disease? 1  Pre-Op Treatment w/ Insulin? 0  Pre-Operative Creatinine > 2 mg/dL? 0  Total Points 2  Perioperative Risk of Major Cardiac Event is (%) 6.6

## 2022-08-20 NOTE — Consult Note (Signed)
ORTHOPAEDIC CONSULTATION  REQUESTING PHYSICIAN: Dwyane Dee, MD  Chief Complaint: Left femur fracture  HPI: Melinda Kerr is a 87 y.o. female who has a history of dementia and has been at [facility] for a couple of years now.  Spoke with her niece [name] to obtain additional history.  Reportedly patient has been on and nonambulatory for over a year.  Unfortunately she has had multiple lower extremity fractures over the last few years including left femoral neck fracture for which she had a hemiarthroplasty and 2 right periprosthetic femur fractures for which she underwent open reduction internal fixation.  Reportedly overnight she had a fall out of bed and was brought to the emergency room.  X-rays demonstrated displaced periprosthetic distal femur fracture.  She also had new onset atrial fibrillation.  Not on anticoagulation at baseline.  Patient this morning appears comfortable.  Oriented only to self.  Denies any significant pain.  Denies distal numbness and tingling.  Past Medical History:  Diagnosis Date   Acute CVA (cerebrovascular accident) ([place]) 05/15/2019   Arthritis    "shoulders, knees, lower back" (07/14/2017)   Atrial fibrillation ([place]) 02/03/2021   CHF (congestive heart failure) ([place])    "while in hospital w/hysterectomy in 2017"   Chronic lower back pain    Depression    Displaced comminuted fracture of shaft of right femur, initial encounter for closed fracture ([place]) 04/08/2019   Diverticulitis of large intestine with abscess 07/13/2017   DVT (deep venous thrombosis) (HCC)    Dyspnea    "cause I'm over weight"   Esophageal reflux    Femur fracture, left ([place]) 02/03/2021   Gout    Hiatal hernia    Hyperlipidemia    under control   Hypertension    Hypothyroidism    Morbid obesity with BMI of 40.0-44.9, adult ([place])    Nocturia    OSA on CPAP    "not wearing it when I sleep in my lift chair" (07/14/2017)   Osteoarthritis    Phlebitis    hx of   Pneumonia ?1989    Spondylosis    with scoliosis   TIA (transient ischemic attack) 05/14/2019   Type II diabetes mellitus (HCC)    borderline , diet controlled    Urinary frequency    Uterine cancer ([place])    S/P chemo, radiation, hysterectomy   Past Surgical History:  Procedure Laterality Date   ANTERIOR APPROACH HEMI HIP ARTHROPLASTY Left 02/05/2021   Procedure: ANTERIOR APPROACH HEMI HIP ARTHROPLASTY;  Surgeon: Rod Can, MD;  Location: [facility];  Service: Orthopedics;  Laterality: Left;   APPENDECTOMY     APPLICATION OF WOUND VAC Left 03/08/2021   Procedure: APPLICATION OF WOUND VAC;  Surgeon: Rod Can, MD;  Location: [place];  Service: Orthopedics;  Laterality: Left;   CATARACT EXTRACTION, BILATERAL Bilateral 2018   COLONOSCOPY     DILATATION & CURETTAGE/HYSTEROSCOPY WITH MYOSURE N/A 03/04/2016   Procedure: DILATATION & CURETTAGE/HYSTEROSCOPY ;  Surgeon: Azucena Fallen, MD;  Location: [facility];  Service: Gynecology;  Laterality: N/A;  polyp removal    ESOPHAGOGASTRODUODENOSCOPY (EGD) WITH PROPOFOL N/A 11/14/2018   Procedure: ESOPHAGOGASTRODUODENOSCOPY (EGD) WITH PROPOFOL;  Surgeon: Wonda Horner, MD;  Location: [facility];  Service: Endoscopy;  Laterality: N/A;   FEMUR IM NAIL Right 04/09/2019   Procedure: INTRAMEDULLARY (IM) NAIL INTERTROCH;  Surgeon: Renette Butters, MD;  Location: [place];  Service: Orthopedics;  Laterality: Right;   HARDWARE REMOVAL Right 05/09/2020   Procedure: HARDWARE REMOVAL RIGHT FEMUR;  Surgeon:  Haddix, Thomasene Lot, MD;  Location: [place];  Service: Orthopedics;  Laterality: Right;   HERNIA REPAIR     I & D EXTREMITY Left 03/08/2021   Procedure: LEFT HIP IRRIGATION AND DEBRIDEMENT;  Surgeon: Rod Can, MD;  Location: [place];  Service: Orthopedics;  Laterality: Left;   IR GENERIC HISTORICAL  05/15/2016   IR US GUIDE VASC ACCESS RIGHT 05/15/2016 Aletta Edouard, MD [facility]   IR GENERIC HISTORICAL  05/15/2016   IR FLUORO GUIDE CV LINE RIGHT 05/15/2016 Aletta Edouard, MD  [facility]   IR REMOVAL TUN ACCESS W/ PORT W/O FL MOD SED  04/19/2018   LAPAROSCOPIC CHOLECYSTECTOMY  2004   LAPAROSCOPIC INCISIONAL / UMBILICAL / VENTRAL HERNIA REPAIR  2004   "w/gallbladder OR"   LYMPH NODE BIOPSY N/A 03/26/2016   Procedure: [place] LYMPH NODE BIOPSY;  Surgeon: Everitt Amber, MD;  Location: [facility];  Service: Gynecology;  Laterality: N/A;   ORIF FEMUR FRACTURE Right 05/09/2020   Procedure: OPEN REDUCTION INTERNAL FIXATION (ORIF) DISTAL FEMUR FRACTURE;  Surgeon: Shona Needles, MD;  Location: [place];  Service: Orthopedics;  Laterality: Right;   REVERSE SHOULDER ARTHROPLASTY Right 10/26/2018   Procedure: REVERSE SHOULDER ARTHROPLASTY;  Surgeon: Nicholes Stairs, MD;  Location: [place];  Service: Orthopedics;  Laterality: Right;   ROBOTIC ASSISTED TOTAL HYSTERECTOMY WITH BILATERAL SALPINGO OOPHERECTOMY N/A 03/26/2016   Procedure: XI ROBOTIC ASSISTED TOTAL Laproscopic HYSTERECTOMY WITH BILATERAL SALPINGO OOPHORECTOMY;  Surgeon: Everitt Amber, MD;  Location: [facility];  Service: Gynecology;  Laterality: N/A;   TOTAL KNEE ARTHROPLASTY Left 10/09/2013   Procedure: LEFT TOTAL KNEE ARTHROPLASTY;  Surgeon: [name], MD;  Location: [facility];  Service: Orthopedics;  Laterality: Left;   TOTAL KNEE ARTHROPLASTY Right 04/09/2014   Procedure: RIGHT TOTAL KNEE ARTHROPLASTY;  Surgeon: [name], MD;  Location: [facility];  Service: Orthopedics;  Laterality: Right;   TUBAL LIGATION  1982   VARICOSE VEIN SURGERY Bilateral 1968   WISDOM TOOTH EXTRACTION  1963   Social History   Socioeconomic History   Marital status: Widowed    Spouse name: Not on file   Number of children: 0   Years of education: Not on file   Highest education level: Not on file  Occupational History   Occupation: Tax collection retired  Tobacco Use   Smoking status: Never   Smokeless tobacco: Never  Vaping Use   Vaping Use: Never used  Substance and Sexual Activity   Alcohol use: No   Drug use: No   Sexual  activity: Never  Other Topics Concern   Not on file  Social History Narrative   Not on file   Social Determinants of Health   Financial Resource Strain: Not on file  Food Insecurity: Not on file  Transportation Needs: Not on file  Physical Activity: Not on file  Stress: Not on file  Social Connections: Not on file   Family History  Problem Relation Age of Onset   Heart disease Mother    Heart disease Father    CVA Father    Allergies  Allergen Reactions   Oxybutynin Other (See Comments)    Bleeding gums, felt sick     Positive ROS: All other systems have been reviewed and were otherwise negative with the exception of those mentioned in the HPI and as above.  Physical Exam: General: Alert, no acute distress Cardiovascular: No pedal edema Respiratory: No cyanosis, no use of accessory musculature Skin: No lesions in the area of chief  complaint Neurologic: Sensation intact distally  MUSCULOSKELETAL:  LLE No traumatic wounds, ecchymosis, or rash  Tenderness about the distal femur mild swelling  No groin pain with log roll  Sens DPN, SPN, TN intact  Motor EHL, ext, flex 5/5  DP 2+, PT 2+, No significant edema   IMAGING: X-rays left femur reviewed demonstrate a highly comminuted periprosthetic distal femur fracture  Assessment: Principal Problem:   Displaced comminuted fracture of shaft of left femur, initial encounter for closed fracture (HCC) Active Problems:   OSA on CPAP   Hypothyroidism   History of CVA (cerebrovascular accident)   Chronic diastolic CHF (congestive heart failure) (HCC)   Bedbound   Obesity (BMI 30-39.9)   A-fib ([place])   Type 2 diabetes mellitus without complications ([place])   DNR (do not resuscitate)   Left periprosthetic distal femur fracture  Plan: Discussed with the patient's niece [name] who is her power of attorney that unfortunately she has sustained a displaced periprosthetic distal femur fracture.  Although she is nonambulatory  fixation will help with pain control and general hygiene care, and mobility.  We will plan for open reduction internal fixation tomorrow morning pending OR availability.  Please have patient n.p.o. after midnight.  She will be nonweightbearing in a knee immobilizer until surgery.  Patient very anemic at baseline.  Hemoglobin 8.6.  Discussed with niece anticipation of needing a blood transfusion perioperatively.    Willaim Sheng, MD  Contact information:   [id number] 7am-5pm epic message Dr. Zachery Dakins, or call office for patient follow up: [phone] After hours and holidays please check [email or web address] for group call information for [facility]

## 2022-08-20 NOTE — Assessment & Plan Note (Addendum)
-   not unexpected given dementia and hip fx at this time - foley attempted by nursing multiple times without success - foley able to be placed in OR  - okay to d/c foley today and will do TOV; start bladder scans after foley removal

## 2022-08-20 NOTE — Progress Notes (Signed)
Orthopedic Tech Progress Note Patient Details:  Melinda Kerr Jan 10, 1935 [id number]  Patient ID: Melinda Kerr, female   DOB: Apr 16, 1935, 87 y.o.   MRN: [id number]  [name] 08/20/2022, 9:43 AM Left knee immobilizer applied

## 2022-08-20 NOTE — Subjective & Objective (Addendum)
CC: fall at SNF HPI: 87 year old female history of bedbound status, morbid obesity BMI 37, history of chronic diastolic heart failure, history of CVA, type 2 diabetes, who fell out of bed at the nursing home.  Reportedly patient try to get out of bed and fell.  Patient had external rotation of her left leg.  Brought to the hospital by EMS.  Left femur x-rays demonstrated comminuted displaced distal left femur fracture.  She has had a prior knee replacement.  Hip x-rays were negative for fractures.  She has a left hip arthroplasty.  She also has a right hip screw with intramedullary rod on the right-hand side.  Chest x-ray negative for pneumonia.  She has a large hiatal hernia.  EKG shows rate controlled A-fib.  She has no prior history of A-fib.  She does have a prior history of a DVT but is currently not on anticoagulation.  Patient unable to give history or review of systems due to poor historical recall.  EDP is contacted orthopedics who wants patient kept NPO.  [facility] hospitalist contacted for admission.

## 2022-08-20 NOTE — H&P (Addendum)
History and Physical    Melinda Kerr WYO:[id number] DOB: 07-08-1935 DOA: 08/20/2022  DOS: the patient was seen and examined on 08/20/2022  PCP: Maury Dus, MD   Patient coming from: SNF - [facility]  I have personally briefly reviewed patient's old medical records in [place]  CC: fall at SNF HPI: 87 year old female history of bedbound status, morbid obesity BMI 37, history of chronic diastolic heart failure, history of CVA, type 2 diabetes, who fell out of bed at the nursing home.  Reportedly patient try to get out of bed and fell.  Patient had external rotation of her left leg.  Brought to the hospital by EMS.  Left femur x-rays demonstrated comminuted displaced distal left femur fracture.  She has had a prior knee replacement.  Hip x-rays were negative for fractures.  She has a left hip arthroplasty.  She also has a right hip screw with intramedullary rod on the right-hand side.  Chest x-ray negative for pneumonia.  She has a large hiatal hernia.  EKG shows rate controlled A-fib.  She has no prior history of A-fib.  She does have a prior history of a DVT but is currently not on anticoagulation.  Patient unable to give history or review of systems due to poor historical recall.  EDP is contacted orthopedics who wants patient kept NPO.  [facility] hospitalist contacted for admission.    ED Course: left femur xrays show comminuted and displaced distal left femur fracture.  Review of Systems:  Review of Systems  Unable to perform ROS: Dementia    Past Medical History:  Diagnosis Date   Acute CVA (cerebrovascular accident) ([place]) 05/15/2019   Arthritis    "shoulders, knees, lower back" (07/14/2017)   CHF (congestive heart failure) ([place])    "while in hospital w/hysterectomy in 2017"   Chronic lower back pain    Depression    Displaced comminuted fracture of shaft of right femur, initial encounter for closed fracture ([place]) 04/08/2019   Diverticulitis of large intestine  with abscess 07/13/2017   DVT (deep venous thrombosis) (HCC)    Dyspnea    "cause I'm over weight"   Esophageal reflux    Femur fracture, left ([place]) 02/03/2021   Gout    Hiatal hernia    Hyperlipidemia    under control   Hypertension    Hypothyroidism    Morbid obesity with BMI of 40.0-44.9, adult ([place])    Nocturia    OSA on CPAP    "not wearing it when I sleep in my lift chair" (07/14/2017)   Osteoarthritis    Phlebitis    hx of   Pneumonia ?1989   Spondylosis    with scoliosis   TIA (transient ischemic attack) 05/14/2019   Type II diabetes mellitus (HCC)    borderline , diet controlled    Urinary frequency    Uterine cancer ([place])    S/P chemo, radiation, hysterectomy    Past Surgical History:  Procedure Laterality Date   ANTERIOR APPROACH HEMI HIP ARTHROPLASTY Left 02/05/2021   Procedure: ANTERIOR APPROACH HEMI HIP ARTHROPLASTY;  Surgeon: Rod Can, MD;  Location: [facility];  Service: Orthopedics;  Laterality: Left;   APPENDECTOMY     APPLICATION OF WOUND VAC Left 03/08/2021   Procedure: APPLICATION OF WOUND VAC;  Surgeon: Rod Can, MD;  Location: [place];  Service: Orthopedics;  Laterality: Left;   CATARACT EXTRACTION, BILATERAL Bilateral 2018   COLONOSCOPY     DILATATION & CURETTAGE/HYSTEROSCOPY WITH MYOSURE N/A 03/04/2016  Procedure: DILATATION & CURETTAGE/HYSTEROSCOPY ;  Surgeon: Azucena Fallen, MD;  Location: [facility];  Service: Gynecology;  Laterality: N/A;  polyp removal    ESOPHAGOGASTRODUODENOSCOPY (EGD) WITH PROPOFOL N/A 11/14/2018   Procedure: ESOPHAGOGASTRODUODENOSCOPY (EGD) WITH PROPOFOL;  Surgeon: Wonda Horner, MD;  Location: [facility];  Service: Endoscopy;  Laterality: N/A;   FEMUR IM NAIL Right 04/09/2019   Procedure: INTRAMEDULLARY (IM) NAIL INTERTROCH;  Surgeon: Renette Butters, MD;  Location: [place] [place];  Service: Orthopedics;  Laterality: Right;   HARDWARE REMOVAL Right 05/09/2020   Procedure: HARDWARE REMOVAL RIGHT FEMUR;  Surgeon: Shona Needles, MD;  Location: [place];  Service: Orthopedics;  Laterality: Right;   HERNIA REPAIR     I & D EXTREMITY Left 03/08/2021   Procedure: LEFT HIP IRRIGATION AND DEBRIDEMENT;  Surgeon: Rod Can, MD;  Location: [facility];  Service: Orthopedics;  Laterality: Left;   IR GENERIC HISTORICAL  05/15/2016   IR US GUIDE VASC ACCESS RIGHT 05/15/2016 Aletta Edouard, MD [facility]   IR GENERIC HISTORICAL  05/15/2016   IR FLUORO GUIDE CV LINE RIGHT 05/15/2016 Aletta Edouard, MD [facility]   IR REMOVAL TUN ACCESS W/ PORT W/O FL MOD SED  04/19/2018   LAPAROSCOPIC CHOLECYSTECTOMY  2004   LAPAROSCOPIC INCISIONAL / UMBILICAL / VENTRAL HERNIA REPAIR  2004   "w/gallbladder OR"   LYMPH NODE BIOPSY N/A 03/26/2016   Procedure: [place] LYMPH NODE BIOPSY;  Surgeon: Everitt Amber, MD;  [facility]: [facility];  Service: Gynecology;  Laterality: N/A;   ORIF FEMUR FRACTURE Right 05/09/2020   Procedure: OPEN REDUCTION INTERNAL FIXATION (ORIF) DISTAL FEMUR FRACTURE;  Surgeon: Shona Needles, MD;  Location: [place] [place];  Service: Orthopedics;  Laterality: Right;   REVERSE SHOULDER ARTHROPLASTY Right 10/26/2018   Procedure: REVERSE SHOULDER ARTHROPLASTY;  Surgeon: Nicholes Stairs, MD;  Location: [place];  Service: Orthopedics;  Laterality: Right;   ROBOTIC ASSISTED TOTAL HYSTERECTOMY WITH BILATERAL SALPINGO OOPHERECTOMY N/A 03/26/2016   Procedure: XI ROBOTIC ASSISTED TOTAL Laproscopic HYSTERECTOMY WITH BILATERAL SALPINGO OOPHORECTOMY;  Surgeon: Everitt Amber, MD;  [facility];  Service: Gynecology;  Laterality: N/A;   TOTAL KNEE ARTHROPLASTY Left 10/09/2013   Procedure: LEFT TOTAL KNEE ARTHROPLASTY;  Surgeon: [name], MD;  [facility];  Service: Orthopedics;  Laterality: Left;   TOTAL KNEE ARTHROPLASTY Right 04/09/2014   Procedure: RIGHT TOTAL KNEE ARTHROPLASTY;  Surgeon: [name], MD;  [facility];  Service: Orthopedics;  Laterality: Right;   TUBAL LIGATION  1982   VARICOSE VEIN SURGERY Bilateral 1968   WISDOM  TOOTH EXTRACTION  1963     reports that she has never smoked. She has never used smokeless tobacco. She reports that she does not drink alcohol and does not use drugs.  Allergies  Allergen Reactions   Oxybutynin Other (See Comments)    Bleeding gums, felt sick    Family History  Problem Relation Age of Onset   Heart disease Mother    Heart disease Father    CVA Father     Prior to Admission medications   Medication Sig Start Date End Date Taking? Authorizing Provider  albuterol (VENTOLIN HFA) 108 (90 Base) MCG/ACT inhaler Inhale 2 puffs into the lungs every 8 (eight) hours as needed for wheezing.    [provider]  allopurinol (ZYLOPRIM) 300 MG tablet Take 300 mg by mouth daily.     [provider]  baclofen (LIORESAL) 10 MG tablet Take 1 tablet (10 mg total) by mouth 3 (three)  times daily as needed for muscle spasms. 04/12/19   Swayze, Ava, DO  calcium carbonate (TUMS - DOSED IN MG ELEMENTAL CALCIUM) 500 MG chewable tablet Chew 1 tablet by mouth daily.    [provider]  citalopram (CELEXA) 10 MG tablet Take 10 mg by mouth daily. 03/20/19   [provider]  diclofenac Sodium (VOLTAREN) 1 % GEL Apply 2 g topically every 12 (twelve) hours as needed (pain).    [provider]  docusate sodium (COLACE) 100 MG capsule Take 1 capsule (100 mg total) by mouth 2 (two) times daily. 05/16/19   [place] Donnamarie Poag, DO  DULoxetine HCl 30 MG CSDR Take by mouth.    [provider]  furosemide (LASIX) 40 MG tablet Take 0.5 tablets (20 mg total) by mouth daily. 05/11/20   Lavina Hamman, MD  hydrOXYzine (ATARAX/VISTARIL) 25 MG tablet Take 25 mg by mouth at bedtime.    [provider]  levothyroxine (SYNTHROID, LEVOTHROID) 125 MCG tablet Take 125 mcg by mouth daily before breakfast.    [provider]  metoCLOPramide (REGLAN) 5 MG tablet Take 5 mg by mouth 2 (two) times daily at 8 am and 10 pm.    [provider]  nystatin  (MYCOSTATIN/NYSTOP) powder Apply 1 Bottle topically See admin instructions. Apply topically to abdominal skin folds and under bilateral breast for intertrigo - twice daily - please make sure skin has been cleaned and is dry before applying    [provider]  oxyCODONE (OXY IR/ROXICODONE) 5 MG immediate release tablet Take 5 mg by mouth See admin instructions. Take 5 mg by mouth every four hours while awake and an additional 5 mg every four hours as needed for severe pain    [provider]  OXYGEN Inhale 2 L/min into the lungs See admin instructions. 2 L/min every shift for shortness of breath and every 8 hours as needed to maintain sats of at least 90%    [provider]  pantoprazole (PROTONIX) 40 MG tablet Take 1 tablet (40 mg total) by mouth 2 (two) times daily before a meal. 07/24/22 10/22/22  Vicie Mutters R, PA-C  polyethylene glycol (MIRALAX / GLYCOLAX) packet Take 17 g by mouth daily as needed for mild constipation or moderate constipation. 04/12/14   Perkins, Alexzandrew L, PA-C  Polysaccharide-Iron Complex 150 MG CAPS Take 150 mg by mouth in the morning.    [provider]  senna-docusate (SENOKOT-S) 8.6-50 MG tablet Take 1 tablet by mouth daily.    [provider]  Simethicone 250 MG CAPS Take 250 mg by mouth every 8 (eight) hours as needed (gas).    [provider]  spironolactone (ALDACTONE) 25 MG tablet Take 25 mg by mouth daily.    [provider]  sucralfate (CARAFATE) 1 g tablet Take 1 tablet (1 g total) by mouth 4 (four) times daily -  with meals and at bedtime. 07/24/22   Vladimir Crofts, PA-C  Vitamin D, Ergocalciferol, (DRISDOL) 1.25 MG (50000 UT) CAPS capsule Take 50,000 Units by mouth every Monday.    [provider]    Physical Exam: Vitals:   08/20/22 0215 08/20/22 0315 08/20/22 0345 08/20/22 0430  BP: 115/87 (!) 126/106 127/72 134/65  Pulse: (!) 121 (!) 101 92 (!) 107  Resp: (!) 22 (!) 26 (!) 25  (!) 23  Temp:      TempSrc:      SpO2: 96% 98% 91% 97%  Weight:  Height:        Physical Exam Vitals and nursing note reviewed.  Constitutional:      General: She is not in acute distress.    Appearance: She is obese. She is not ill-appearing, toxic-appearing or diaphoretic.  HENT:     Head: Normocephalic and atraumatic.     Nose: Nose normal.  Cardiovascular:     Rate and Rhythm: Normal rate. Rhythm irregular.  Pulmonary:     Effort: Pulmonary effort is normal.  Abdominal:     General: There is no distension.     Tenderness: There is no abdominal tenderness.  Musculoskeletal:     Comments: Left lower extremity shortened and externally rotated.  Skin:    General: Skin is warm and dry.     Capillary Refill: Capillary refill takes less than 2 seconds.  Neurological:     Mental Status: She is disoriented.      Labs on Admission: I have personally reviewed following labs and imaging studies  CBC: Recent Labs  Lab 08/20/22 0140  WBC 33.1*  NEUTROABS 29.5*  HGB 8.6*  HCT 30.4*  MCV 80.6  PLT 341*   Basic Metabolic Panel: Recent Labs  Lab 08/20/22 0140  NA 140  K 3.7  CL 99  CO2 34*  GLUCOSE 235*  BUN 23  CREATININE 0.76  CALCIUM 9.4   GFR: Estimated Creatinine Clearance: 60.9 mL/min (by C-G formula based on SCr of 0.76 mg/dL). Liver Function Tests: No results for input(s): "AST", "ALT", "ALKPHOS", "BILITOT", "PROT", "ALBUMIN" in the last 168 hours. No results for input(s): "LIPASE", "AMYLASE" in the last 168 hours. No results for input(s): "AMMONIA" in the last 168 hours. Coagulation Profile: Recent Labs  Lab 08/20/22 0140  INR 1.2   Cardiac Enzymes: No results for input(s): "CKTOTAL", "CKMB", "CKMBINDEX", "TROPONINI", "TROPONINIHS" in the last 168 hours. BNP (last 3 results) No results for input(s): "PROBNP" in the last 8760 hours. HbA1C: No results for input(s): "HGBA1C" in the last 72 hours. CBG: No results for input(s): "GLUCAP" in the  last 168 hours. Lipid Profile: No results for input(s): "CHOL", "HDL", "LDLCALC", "TRIG", "CHOLHDL", "LDLDIRECT" in the last 72 hours. Thyroid Function Tests: No results for input(s): "TSH", "T4TOTAL", "FREET4", "T3FREE", "THYROIDAB" in the last 72 hours. Anemia Panel: No results for input(s): "VITAMINB12", "FOLATE", "FERRITIN", "TIBC", "IRON", "RETICCTPCT" in the last 72 hours. Urine analysis:    Component Value Date/Time   COLORURINE COLORLESS (A) 05/14/2019 1331   APPEARANCEUR CLEAR 05/14/2019 1331   LABSPEC 1.018 05/14/2019 1331   LABSPEC 1.020 05/04/2016 1006   PHURINE 5.0 05/14/2019 1331   GLUCOSEU NEGATIVE 05/14/2019 1331   GLUCOSEU Negative 05/04/2016 1006   HGBUR NEGATIVE 05/14/2019 1331   BILIRUBINUR NEGATIVE 05/14/2019 1331   BILIRUBINUR Negative 05/04/2016 1006   KETONESUR NEGATIVE 05/14/2019 1331   PROTEINUR NEGATIVE 05/14/2019 1331   UROBILINOGEN 0.2 05/04/2016 1006   NITRITE NEGATIVE 05/14/2019 1331   LEUKOCYTESUR NEGATIVE 05/14/2019 1331   LEUKOCYTESUR Small 05/04/2016 1006    Radiological Exams on Admission: I have personally reviewed images DG Femur Min 2 Views Left  Result Date: 08/20/2022 CLINICAL DATA:  Fall, thigh pain EXAM: LEFT FEMUR 2 VIEWS COMPARISON:  None FINDINGS: Prior left hip and knee replacement. There is a comminuted fracture through the distal femoral metaphysis just above the knee hardware. Posterior displacement of distal fragments. IMPRESSION: Comminuted displaced distal femoral fracture. Electronically Signed   By: Rolm Baptise M.D.   On: 08/20/2022 03:19   DG Hip Unilat With Pelvis  2-3 Views Left  Result Date: 08/20/2022 CLINICAL DATA:  Fall EXAM: DG HIP (WITH OR WITHOUT PELVIS) 2-3V LEFT COMPARISON:  None Available. FINDINGS: Left hip arthroplasty is present. There is no acute fracture or dislocation identified. Right-sided hip screw and intramedullary nail are present. There is heterotopic ossification surrounding the right hip. Moderate  degenerative changes affect the right hip. IMPRESSION: 1. No acute findings. 2. Moderate degenerative changes of the right hip. Electronically Signed   By: Ronney Asters M.D.   On: 08/20/2022 01:24   DG Chest 1 View  Result Date: 08/20/2022 CLINICAL DATA:  Fall. EXAM: CHEST  1 VIEW COMPARISON:  Chest x-ray 03/12/2021.  Chest CT 05/14/2019. FINDINGS: Large hiatal hernia is again seen. Heart is mildly enlarged. There central pulmonary vascular congestion. There is no focal lung infiltrate, pleural effusion or pneumothorax. There is minimal bibasilar atelectasis. No acute fractures are seen. Right shoulder arthroplasty is present. IMPRESSION: 1. Mild cardiomegaly with central pulmonary vascular congestion. 2. Large hiatal hernia. Electronically Signed   By: Ronney Asters M.D.   On: 08/20/2022 01:22    EKG: My personal interpretation of EKG shows: afib      Assessment/Plan Principal Problem:   Displaced comminuted fracture of shaft of left femur, initial encounter for closed fracture (HCC) Active Problems:   A-fib (HCC)   OSA on CPAP   Hypothyroidism   History of CVA (cerebrovascular accident)   Chronic diastolic CHF (congestive heart failure) (HCC)   Bedbound   Obesity (BMI 30-39.9)   Type 2 diabetes mellitus without complications ([place])   DNR (do not resuscitate)    Assessment and Plan: * Displaced comminuted fracture of shaft of left femur, initial encounter for closed fracture ([place]) Admit to inpatient med/surg bed. EDP has contacted orthopedicsZachery Dakins) who wanted pt to be kept NPO. Prn oxycodone for pain.    08/20/22 0445  Revised Cardiac Risk Index (RCRI)  High Risk Surgery?  0  History of Ischemic Heart Disease? 0  History of Congestive Heart Failure? 1  History of Cerebrovascular Disease? 1  Pre-Op Treatment w/ Insulin? 0  Pre-Operative Creatinine > 2 mg/dL? 0  Total Points 2  Perioperative Risk of Major Cardiac Event is (%) 6.6     A-fib (HCC) No prior history  of afib. Not currently on anticoagulation. Does have hx of DVT in the past but is currently not on systemic anticoagulation. Hold off on systemic anticoagulation until decision made about ORIF. Check TSH. Check echo.  Type 2 diabetes mellitus without complications (HCC) Add SSI. Check A1c.  Obesity (BMI 30-39.9) BMI 37.46  Bedbound Chronic.  Chronic diastolic CHF (congestive heart failure) (HCC) Stable.  History of CVA (cerebrovascular accident) Chronic.  Hypothyroidism Stable. Continue synthroid 125 mcg daily.  OSA on CPAP Stable. Continue CPAP.  DNR (do not resuscitate) Verified DNR orders from SNF order sheet.        DVT prophylaxis: SCDs Code Status: DNR/DNI(Do NOT Intubate). Reviewed DNR orders from SNF Family Communication: no family at bedside  Disposition Plan: return to SNF  Consults called: EDP has consulted orthopedicsZachery Dakins) Admission status: Inpatient, Med-Surg   Kristopher Oppenheim, DO [facility] 08/20/2022, 4:50 AM

## 2022-08-20 NOTE — Progress Notes (Signed)
Urology RN called to help insert foley.  3E staff attempted x3 and 4th floor staff attempted x1.  Insertion attempt was unsuccessful.  Pt's RN going to notify MD. Andre Lefort

## 2022-08-20 NOTE — Assessment & Plan Note (Signed)
Chronic. 

## 2022-08-20 NOTE — Assessment & Plan Note (Signed)
Stable.  -Continue CPAP

## 2022-08-20 NOTE — Assessment & Plan Note (Addendum)
-   no s/s exacerbation - noted to have Gr 2 DD on May 2022 echo; repeat echo unable to appreciate diastology; EF remains preserved at 60-65%

## 2022-08-20 NOTE — ED Triage Notes (Signed)
BIBA from nursing facility for a fall, she is bed bound got up from bed and fell, unwitnessed fall staff heard her screaming, 61mg fentanyl, external rotation of Lt leg, has UTI on abx, 259mNS, 20 RH

## 2022-08-20 NOTE — Progress Notes (Signed)
Initial Nutrition Assessment  DOCUMENTATION CODES:   Obesity unspecified  INTERVENTION:  - Add Glucerna Shake po TID, each supplement provides 220 kcal and 10 grams of protein  - Add MVI q day.   NUTRITION DIAGNOSIS:   Increased nutrient needs related to hip fracture as evidenced by estimated needs.  GOAL:   Patient will meet greater than or equal to 90% of their needs  MONITOR:   PO intake, Supplement acceptance  REASON FOR ASSESSMENT:   Consult Hip fracture protocol  ASSESSMENT:   87 y.o. female admits related fall at SNF. PMH includes: heart failure, CVA, T2DM, bed bound. Pt is currently receiving medical management for displaced comminuted fracture of shaft of left femur.  Meds include: lasix, sliding scale insulin. Labs reviewed: WNL.   Pt is currently Oriented x1. Needs increased related to fracture. Spoke with RN who reports that the pt just ate a few bites of her lunch tray. RD will add Glucerna supplements to help her meet her needs. Will continue to monitor PO intakes.   NUTRITION - FOCUSED PHYSICAL EXAM:  RD unable to assess, attempt at follow up.  Diet Order:   Diet Order             Diet Heart Room service appropriate? Yes; Fluid consistency: Thin  Diet effective now                   EDUCATION NEEDS:   Not appropriate for education at this time  Skin:  Skin Assessment: Reviewed RN Assessment  Last BM:  unknown  Height:   Ht Readings from Last 1 Encounters:  08/20/22 '5\' 5"'$  (1.651 m)    Weight:   Wt Readings from Last 1 Encounters:  08/20/22 102.1 kg    Ideal Body Weight:  56.8 kg  BMI:  Body mass index is 37.46 kg/m.  Estimated Nutritional Needs:   Kcal:  2979-8921 kcals  Protein:  100-125 gm  Fluid:  >/= 2L  Nikola Marone Graciela Husbands, RD, LDN, CNSC.

## 2022-08-20 NOTE — Assessment & Plan Note (Addendum)
-   s/p fall from bed; likely some contribution from her dementia - xray shows left comminuted displaced distal femoral fracture - evaluated by ortho and underwent ORIF on 08/22/22 - continue pain control  - follow up PT/OT evals post op - patient from [facility] - continue neurovas checks to B/L LE due to significant bruising/swelling

## 2022-08-20 NOTE — ED Provider Notes (Signed)
[facility] DEPT Provider Note   CSN: [id number] Arrival date & time: 08/20/22  0003     History  Chief Complaint  Patient presents with   Melinda Kerr Melinda Kerr is a 87 y.o. female.  The history is provided by the patient and the EMS personnel.  Fall  She has history of hypertension, diabetes, hyperlipidemia, gout, uterine cancer, heart failure and is brought in by ambulance from a skilled nursing facility after patient fell out of bed.  Fall was unwitnessed.  She is complaining of pain in her left leg.  Left leg was noted to be externally rotated.  She was given fentanyl in route.   Home Medications Prior to Admission medications   Medication Sig Start Date End Date Taking? Authorizing Provider  albuterol (VENTOLIN HFA) 108 (90 Base) MCG/ACT inhaler Inhale 2 puffs into the lungs every 8 (eight) hours as needed for wheezing.    [provider]  allopurinol (ZYLOPRIM) 300 MG tablet Take 300 mg by mouth daily.     [provider]  baclofen (LIORESAL) 10 MG tablet Take 1 tablet (10 mg total) by mouth 3 (three) times daily as needed for muscle spasms. 04/12/19   Swayze, Ava, DO  calcium carbonate (TUMS - DOSED IN MG ELEMENTAL CALCIUM) 500 MG chewable tablet Chew 1 tablet by mouth daily.    [provider]  citalopram (CELEXA) 10 MG tablet Take 10 mg by mouth daily. 03/20/19   [provider]  diclofenac Sodium (VOLTAREN) 1 % GEL Apply 2 g topically every 12 (twelve) hours as needed (pain).    [provider]  docusate sodium (COLACE) 100 MG capsule Take 1 capsule (100 mg total) by mouth 2 (two) times daily. 05/16/19   [place] Donnamarie Poag, DO  DULoxetine HCl 30 MG CSDR Take by mouth.    [provider]  furosemide (LASIX) 40 MG tablet Take 0.5 tablets (20 mg total) by mouth daily. 05/11/20   Lavina Hamman, MD  hydrOXYzine (ATARAX/VISTARIL) 25 MG tablet Take 25 mg by mouth at bedtime.    [provider]  levothyroxine (SYNTHROID, LEVOTHROID) 125 MCG tablet Take 125 mcg by mouth daily before breakfast.    [provider]  metoCLOPramide (REGLAN) 5 MG tablet Take 5 mg by mouth 2 (two) times daily at 8 am and 10 pm.    [provider]  nystatin (MYCOSTATIN/NYSTOP) powder Apply 1 Bottle topically See admin instructions. Apply topically to abdominal skin folds and under bilateral breast for intertrigo - twice daily - please make sure skin has been cleaned and is dry before applying    [provider]  oxyCODONE (OXY IR/ROXICODONE) 5 MG immediate release tablet Take 5 mg by mouth See admin instructions. Take 5 mg by mouth every four hours while awake and an additional 5 mg every four hours as needed for severe pain    [provider]  OXYGEN Inhale 2 L/min into the lungs See admin instructions. 2 L/min every shift for shortness of breath and every 8 hours as needed to maintain sats of at least 90%    [provider]  pantoprazole (PROTONIX) 40 MG tablet Take 1 tablet (40 mg total) by mouth 2 (two) times daily before a meal. 07/24/22 10/22/22  Vicie Mutters R, PA-C  polyethylene glycol (MIRALAX / GLYCOLAX) packet Take 17 g by mouth daily as needed for mild constipation or moderate constipation. 04/12/14   Joelene Millin, PA-C  Polysaccharide-Iron  Complex 150 MG CAPS Take 150 mg by mouth in the morning.    [provider]  senna-docusate (SENOKOT-S) 8.6-50 MG tablet Take 1 tablet by mouth daily.    [provider]  Simethicone 250 MG CAPS Take 250 mg by mouth every 8 (eight) hours as needed (gas).    [provider]  spironolactone (ALDACTONE) 25 MG tablet Take 25 mg by mouth daily.    [provider]  sucralfate (CARAFATE) 1 g tablet Take 1 tablet (1 g total) by mouth 4 (four) times daily -  with meals and at bedtime. 07/24/22   Vladimir Crofts, PA-C  Vitamin D, Ergocalciferol, (DRISDOL) 1.25 MG (50000 UT) CAPS  capsule Take 50,000 Units by mouth every Monday.    [provider]      Allergies    Oxybutynin    Review of Systems   Review of Systems  All other systems reviewed and are negative.   Physical Exam Updated Vital Signs BP (!) 149/98 (BP Location: Right Arm)   Pulse 80   Temp 97.9 F (36.6 C) (Oral)   Resp (!) 21   Ht '5\' 5"'$  (1.651 m)   Wt 102.1 kg   SpO2 99%   BMI 37.46 kg/m  Physical Exam Vitals and nursing note reviewed.   87 year old female, resting comfortably and in no acute distress. Vital signs are significant for elevated blood pressure and borderline elevated respiratory rate. Oxygen saturation is 99%, which is normal. Head is normocephalic and atraumatic. PERRLA, EOMI. Oropharynx is clear. Neck is nontender and supple without adenopathy or JVD. Back is nontender and there is no CVA tenderness. Lungs are clear without rales, wheezes, or rhonchi. Chest is nontender. Heart has regular rate and rhythm without murmur. Abdomen is soft, flat, nontender. Extremities: Left leg is shortened and externally rotated.  There is pain on passive movement of the left leg, tenderness is noted with palpation of the left hip. Neurologic: Awake and alert, oriented to person and place but not time, cranial nerves are intact, moves all extremities equally except for left leg which she does not move because of pain.  ED Results / Procedures / Treatments   Labs (all labs ordered are listed, but only abnormal results are displayed) Labs Reviewed  BASIC METABOLIC PANEL - Abnormal; Notable for the following components:      Result Value   CO2 34 (*)    Glucose, Bld 235 (*)    All other components within normal limits  CBC WITH DIFFERENTIAL/PLATELET - Abnormal; Notable for the following components:   WBC 33.1 (*)    RBC 3.77 (*)    Hemoglobin 8.6 (*)    HCT 30.4 (*)    MCH 22.8 (*)    MCHC 28.3 (*)    RDW 17.5 (*)    Platelets 481 (*)    Neutro Abs 29.5 (*)    Monocytes  Absolute 1.9 (*)    Abs Immature Granulocytes 0.38 (*)    All other components within normal limits  PROTIME-INR - Abnormal; Notable for the following components:   Prothrombin Time 15.4 (*)    All other components within normal limits  TYPE AND SCREEN   EKG EKG Interpretation  Date/Time:  Thursday August 20 2022 01:30:35 EST Ventricular Rate:  98 PR Interval:    QRS Duration: 73 QT Interval:  369 QTC Calculation: 472 R Axis:   -33 Text Interpretation: Atrial flutter/fibrillation Left axis deviation When compared with ECG of 02/05/2021, Atrial  fibrillation has replaced Sinus rhythm QT has shortened Confirmed by Delora Fuel ([id number]) on 08/20/2022 1:33:35 AM  Radiology DG Femur Min 2 Views Left  Result Date: 08/20/2022 CLINICAL DATA:  Fall, thigh pain EXAM: LEFT FEMUR 2 VIEWS COMPARISON:  None FINDINGS: Prior left hip and knee replacement. There is a comminuted fracture through the distal femoral metaphysis just above the knee hardware. Posterior displacement of distal fragments. IMPRESSION: Comminuted displaced distal femoral fracture. Electronically Signed   By: Rolm Baptise M.D.   On: 08/20/2022 03:19   DG Hip Unilat With Pelvis 2-3 Views Left  Result Date: 08/20/2022 CLINICAL DATA:  Fall EXAM: DG HIP (WITH OR WITHOUT PELVIS) 2-3V LEFT COMPARISON:  None Available. FINDINGS: Left hip arthroplasty is present. There is no acute fracture or dislocation identified. Right-sided hip screw and intramedullary nail are present. There is heterotopic ossification surrounding the right hip. Moderate degenerative changes affect the right hip. IMPRESSION: 1. No acute findings. 2. Moderate degenerative changes of the right hip. Electronically Signed   By: Ronney Asters M.D.   On: 08/20/2022 01:24   DG Chest 1 View  Result Date: 08/20/2022 CLINICAL DATA:  Fall. EXAM: CHEST  1 VIEW COMPARISON:  Chest x-ray 03/12/2021.  Chest CT 05/14/2019. FINDINGS: Large hiatal hernia is again seen. Heart is  mildly enlarged. There central pulmonary vascular congestion. There is no focal lung infiltrate, pleural effusion or pneumothorax. There is minimal bibasilar atelectasis. No acute fractures are seen. Right shoulder arthroplasty is present. IMPRESSION: 1. Mild cardiomegaly with central pulmonary vascular congestion. 2. Large hiatal hernia. Electronically Signed   By: Ronney Asters M.D.   On: 08/20/2022 01:22    CLINICAL DATA: Head trauma, fall  EXAM: CT HEAD WITHOUT CONTRAST  TECHNIQUE: Contiguous axial images were obtained from the base of the skull through the vertex without intravenous contrast.  RADIATION DOSE REDUCTION: This exam was performed according to the departmental dose-optimization program which includes automated exposure control, adjustment of the mA and/or kV according to patient size and/or use of iterative reconstruction technique.  COMPARISON: 05/14/2019  FINDINGS: Brain: There is periventricular white matter decreased attenuation consistent with small vessel ischemic changes. Ventricles, sulci and cisterns are prominent consistent with age related involutional changes. No acute intracranial hemorrhage, mass effect or shift. No hydrocephalus.  There is new hypoattenuation in the left frontal lobe which could represent sequelae of a CVA. Consider MRI if this is a concern clinically.  Vascular: Intracerebral vascular atheromatous changes noted..  Skull: Normal. Negative for fracture or focal lesion.  Sinuses/Orbits: No acute finding.  IMPRESSION: Atrophy and chronic small vessel ischemic changes. Left frontal decreased attenuation which is new suggesting possible interval CVA. No evidence of mass effect or shift.   Electronically Signed By: Sammie Bench M.D. On: 08/20/2022 01:28   CLINICAL DATA: Neck trauma (Age >= 65y)  EXAM: CT CERVICAL SPINE WITHOUT CONTRAST  TECHNIQUE: Multidetector CT imaging of the cervical spine was performed  without intravenous contrast. Multiplanar CT image reconstructions were also generated.  RADIATION DOSE REDUCTION: This exam was performed according to the departmental dose-optimization program which includes automated exposure control, adjustment of the mA and/or kV according to patient size and/or use of iterative reconstruction technique.  COMPARISON: None Available.  FINDINGS: Alignment: Normal.  Skull base and vertebrae: No acute fracture. No primary bone lesion or focal pathologic process.  Soft tissues and spinal canal: No prevertebral fluid or swelling. No visible canal hematoma.  Disc levels: Extensive degenerative disc disease identified at each cervical level with  loss of disc spaces, anterior and posterior projecting marginal osteophytes. There is multilevel facet joint osteoarthritis as well as uncovertebral joint degenerative changes at multiple levels.  Upper chest: Negative.  IMPRESSION: Extensive degenerative changes. No acute traumatic abnormalities.   Electronically Signed By: Sammie Bench M.D. On: 08/20/2022 01:31   Procedures Procedures  Cardiac monitor shows atrial fibrillation, per my interpretation  Medications Ordered in ED Medications  0.9 %  sodium chloride infusion (has no administration in time range)  morphine (PF) 4 MG/ML injection 4 mg (has no administration in time range)   ED Course/ Medical Decision Making/ A&P                           Medical Decision Making Amount and/or Complexity of Data Reviewed Labs: ordered. Radiology: ordered.  Risk Prescription drug management. Decision regarding hospitalization.   Fall with left hip injury concerning for fracture.  Because of dementia, I have ordered CT of head and cervical spine as well as x-rays of the left hip and routine preoperative labs and ECG.  I have ordered morphine for pain.  I have reviewed and interpreted her electrocardiogram, my interpretation is atrial  fibrillation with controlled ventricular response.  Atrial fibrillation had not been present on prior electrocardiogram.  I have reviewed and interpreted her laboratory test, and my interpretation is chronic metabolic alkalosis, normocytic anemia which is slightly worse than baseline, thrombocytosis which has been present previously, marked leukocytosis, likely leukemoid reaction.  CT of head and cervical spine showed no acute injury.  I have independently viewed the images, and agree with radiologist's interpretation.  X-rays of the left hip show left hip arthroplasty without fracture.  I have independently viewed the images, and agree with radiologist interpretation.  At this point, I suspect the fracture is more distal and I have ordered an x-ray of the left femur.  Chest x-ray shows cardiomegaly with central pulmonary vascular congestion and a hiatal hernia.  I have independently viewed the image, and agree with the radiologist's interpretation.  Left femur x-ray shows a comminuted displaced distal femur fracture proximal to the total left knee arthroplasty hardware.  I have independently viewed the images, and agree with the radiologist's interpretation.  I have discussed the case with Dr. Zachery Dakins, on-call for orthopedics, who agrees to see the patient in consultation.  Case is discussed with Dr. Bridgett Larsson of [facility], who agrees to admit the patient.  Regarding atrial fibrillation, this is apparently a new diagnosis.  Patient was not aware of being in atrial fibrillation, so she is not a candidate for cardioversion tonight.  At this point, I feel anticoagulation should be held until surgical repair of her femur fracture is accomplished.  CHA2DS2-VASc score is 9, which clearly puts her in a range where anticoagulation is recommended.  CHA2DS2/VAS Stroke Risk Points  Current as of just now     9 >= 2 Points: High Risk  1 - 1.99 Points: Medium Risk  0 Points: Low Risk    No Change      Details     This score determines the patient's risk of having a stroke if the  patient has atrial fibrillation.       Points Metrics  1 Has Congestive Heart Failure:  Yes    Current as of just now  1 Has Vascular Disease:  Yes    Current as of just now  1 Has Hypertension:  Yes    Current as  of just now  2 Age:  78    Current as of just now  1 Has Diabetes:  Yes    Current as of just now  2 Had Stroke:  Yes  Had TIA:  Yes  Had Thromboembolism:  Yes    Current as of just now  1 Female:  Yes    Current as of just now    Final Clinical Impression(s) / ED Diagnoses Final diagnoses:  Fall at nursing home, initial encounter  Closed fracture of distal end of left femur, initial encounter ([facility])  Atrial fibrillation with controlled ventricular response ([facility])  Normocytic anemia  Metabolic alkalosis  Leukocytosis, unspecified type  Thrombocytosis    Rx / DC Orders ED Discharge Orders     None         Delora Fuel, MD 44/61/90 [phone]

## 2022-08-20 NOTE — Progress Notes (Addendum)
Progress Note    MERRILYN LEGLER   UVO:[id number]  DOB: [birth year]  DOA: 08/20/2022     0 PCP: Maury Dus, MD  Initial CC: fall out of bed  Hospital Course: Ms. Helbert is an 87 yo female with PMH afib, dementia, obesity, chronic dCHF, CVA, DMII who presented after a fall out of the bed. She resides at [facility]. She was brought to the ER after her fall.  X-rays and CT head obtained on admission.  Imaging shows left comminuted displaced distal femoral fracture.  She was admitted for evaluation with orthopedic surgery.  Interval History:  Seen this morning resting in bed still having left lower extremity pain from fracture.  Assessment and Plan: * Displaced comminuted fracture of shaft of left femur, initial encounter for closed fracture ([facility]) - s/p fall from bed; likely some contribution from her dementia - xray shows left comminuted displaced distal femoral fracture -Continue immobilizer and bedrest -Tentative plan for surgical repair on 08/21/2022 - continue pain control   A-fib ([facility]) - patient does have prior history of afib; noted on EKG on 02/03/21; at the time she had repair of original left femoral fracture; likely had afib precipitated by the acute stress - she's not been on anticoagulation and I agree, risk outweighs benefit given age and bleed risk - rate control strategy will be pursued; still some RVR on admission - TSH normal, 1.007 - start lopressor - repeat echo now: EF 60-65%, no RWMA, mild LVH, indeterminate diastology (was Gr II DD previously on May 2022 echo); overall her echo is stable and unchanged - as noted on H&P, RCRI 6.6%, but she is considered appropriate risk for repair given periprosthetic fracture and the pain control that repair would provide  Chronic diastolic CHF (congestive heart failure) ([facility]) - no s/s exacerbation - noted to have Gr 2 DD on May 2022 echo; repeat echo unable to appreciate diastology; EF remains preserved at 60-65%  Acute  urinary retention - not unexpected given dementia and hip fx at this time - foley attempted by nursing multiple times without success - bladder scan ~230pm showed 212 cc - for now continue on q6h bladder scans; if retains >350, then may need to ask for urology assistance for foley placement (I've discussed with urology)  DNR (do not resuscitate) Verified DNR orders from SNF order sheet  Type 2 diabetes mellitus without complications ([facility]) Add SSI. Check A1c.  Obesity (BMI 74-25.9) - Complicates overall prognosis and care - Body mass index is 37.46 kg/m.  Bedbound Chronic.  History of CVA (cerebrovascular accident) Chronic. - s/p surgery, would be reasonable to continue on asa   Hypothyroidism Stable. Continue synthroid 125 mcg daily - TSH normal  OSA on CPAP Stable. Continue CPAP.   Old records reviewed in assessment of this patient  Antimicrobials:   DVT prophylaxis:  SCDs Start: 08/20/22 0731   Code Status:   Code Status: DNR  Mobility Assessment (last 72 hours)     Mobility Assessment     Row Name 08/20/22 0945 08/20/22 0650         Does patient have an order for bedrest or is patient medically unstable Yes- Bedfast (Level 1) - Complete Yes- Bedfast (Level 1) - Complete               Barriers to discharge:  Disposition Plan:  [facility] Status is: Inpt  Objective: Blood pressure 114/68, pulse 90, temperature 98.7 F (37.1 C), temperature source Axillary, resp. rate 20, height '5\' 5"'$  (  1.651 m), weight 102.1 kg, SpO2 97 %.  Examination:  Physical Exam Constitutional:      General: She is not in acute distress.    Appearance: Normal appearance.  HENT:     Head: Normocephalic and atraumatic.     Mouth/Throat:     Mouth: Mucous membranes are moist.  Eyes:     Extraocular Movements: Extraocular movements intact.  Cardiovascular:     Rate and Rhythm: Tachycardia present. Rhythm irregular.  Pulmonary:     Effort: Pulmonary effort is normal.      Breath sounds: Normal breath sounds.  Abdominal:     General: Bowel sounds are normal. There is no distension.     Palpations: Abdomen is soft.     Tenderness: There is no abdominal tenderness.  Musculoskeletal:     Cervical back: Normal range of motion.     Comments: LLE noted in knee immobilizer  Skin:    General: Skin is warm and dry.  Neurological:     Mental Status: She is alert. Mental status is at baseline.  Psychiatric:        Mood and Affect: Mood normal.      Consultants:  Orthopedic surgery  Procedures:    Data Reviewed: Results for orders placed or performed during the hospital encounter of 08/20/22 (from the past 24 hour(s))  Basic metabolic panel     Status: Abnormal   Collection Time: 08/20/22  1:40 AM  Result Value Ref Range   Sodium 140 135 - 145 mmol/L   Potassium 3.7 3.5 - 5.1 mmol/L   Chloride 99 98 - 111 mmol/L   CO2 34 (H) 22 - 32 mmol/L   Glucose, Bld 235 (H) 70 - 99 mg/dL   BUN 23 8 - 23 mg/dL   Creatinine, Ser 0.76 0.44 - 1.00 mg/dL   Calcium 9.4 8.9 - 10.3 mg/dL   GFR, Estimated >60 >60 mL/min   Anion gap 7 5 - 15  CBC with Differential     Status: Abnormal   Collection Time: 08/20/22  1:40 AM  Result Value Ref Range   WBC 33.1 (H) 4.0 - 10.5 K/uL   RBC 3.77 (L) 3.87 - 5.11 MIL/uL   Hemoglobin 8.6 (L) 12.0 - 15.0 g/dL   HCT 30.4 (L) 36.0 - 46.0 %   MCV 80.6 80.0 - 100.0 fL   MCH 22.8 (L) 26.0 - 34.0 pg   MCHC 28.3 (L) 30.0 - 36.0 g/dL   RDW 17.5 (H) 11.5 - 15.5 %   Platelets 481 (H) 150 - 400 K/uL   nRBC 0.0 0.0 - 0.2 %   Neutrophils Relative % 89 %   Neutro Abs 29.5 (H) 1.7 - 7.7 K/uL   Lymphocytes Relative 4 %   Lymphs Abs 1.2 0.7 - 4.0 K/uL   Monocytes Relative 6 %   Monocytes Absolute 1.9 (H) 0.1 - 1.0 K/uL   Eosinophils Relative 0 %   Eosinophils Absolute 0.0 0.0 - 0.5 K/uL   Basophils Relative 0 %   Basophils Absolute 0.1 0.0 - 0.1 K/uL   Immature Granulocytes 1 %   Abs Immature Granulocytes 0.38 (H) 0.00 - 0.07 K/uL   Protime-INR     Status: Abnormal   Collection Time: 08/20/22  1:40 AM  Result Value Ref Range   Prothrombin Time 15.4 (H) 11.4 - 15.2 seconds   INR 1.2 0.8 - 1.2  Type and screen [facility]     Status: None   Collection Time: 08/20/22  1:40 AM  Result Value Ref Range   ABO/RH(D) A NEG    Antibody Screen NEG    Sample Expiration      08/23/2022,2359 Performed at [facility], [place] [address] [place], [place] [address]   Surgical PCR screen     Status: Abnormal   Collection Time: 08/20/22  7:34 AM   Specimen: Nasal Mucosa; Nasal Swab  Result Value Ref Range   MRSA, PCR POSITIVE (A) NEGATIVE   Staphylococcus aureus POSITIVE (A) NEGATIVE  TSH     Status: None   Collection Time: 08/20/22  9:08 AM  Result Value Ref Range   TSH 1.007 0.350 - 4.500 uIU/mL    I have Reviewed nursing notes, Vitals, and Lab results since pt's last encounter. Pertinent lab results : see above I have ordered test including BMP, CBC, Mg I have reviewed the last note from staff over past 24 hours I have discussed pt's care plan and test results with nursing staff, case manager  Time spent: Greater than 50% of the 55 minute visit was spent in counseling/coordination of care for the patient as laid out in the A&P.    LOS: 0 days   Dwyane Dee, MD [facility] 08/20/2022, 2:50 PM

## 2022-08-20 NOTE — Assessment & Plan Note (Addendum)
Chronic. - s/p surgery, would be reasonable to continue on asa

## 2022-08-20 NOTE — Progress Notes (Signed)
Foley insertion attempt  unsuccessful after 4 attempts with 4 different RNs including urology RN. Notified MD.  Yehuda Mao, RN

## 2022-08-21 ENCOUNTER — Inpatient Hospital Stay (HOSPITAL_COMMUNITY): Payer: Medicare Other

## 2022-08-21 ENCOUNTER — Inpatient Hospital Stay: Payer: Self-pay

## 2022-08-21 DIAGNOSIS — R338 Other retention of urine: Secondary | ICD-10-CM

## 2022-08-21 DIAGNOSIS — E119 Type 2 diabetes mellitus without complications: Secondary | ICD-10-CM | POA: Diagnosis not present

## 2022-08-21 DIAGNOSIS — S72402A Unspecified fracture of lower end of left femur, initial encounter for closed fracture: Secondary | ICD-10-CM

## 2022-08-21 DIAGNOSIS — I4891 Unspecified atrial fibrillation: Secondary | ICD-10-CM | POA: Diagnosis not present

## 2022-08-21 DIAGNOSIS — S72352A Displaced comminuted fracture of shaft of left femur, initial encounter for closed fracture: Secondary | ICD-10-CM | POA: Diagnosis not present

## 2022-08-21 DIAGNOSIS — D649 Anemia, unspecified: Secondary | ICD-10-CM

## 2022-08-21 DIAGNOSIS — W19XXXA Unspecified fall, initial encounter: Secondary | ICD-10-CM

## 2022-08-21 DIAGNOSIS — Y92129 Unspecified place in nursing home as the place of occurrence of the external cause: Secondary | ICD-10-CM

## 2022-08-21 LAB — CBC WITH DIFFERENTIAL/PLATELET
Abs Immature Granulocytes: 0.1 10*3/uL — ABNORMAL HIGH (ref 0.00–0.07)
Basophils Absolute: 0 10*3/uL (ref 0.0–0.1)
Basophils Relative: 0 %
Eosinophils Absolute: 0 10*3/uL (ref 0.0–0.5)
Eosinophils Relative: 0 %
HCT: 20.1 % — ABNORMAL LOW (ref 36.0–46.0)
Hemoglobin: 5.8 g/dL — CL (ref 12.0–15.0)
Immature Granulocytes: 1 %
Lymphocytes Relative: 11 %
Lymphs Abs: 1.8 10*3/uL (ref 0.7–4.0)
MCH: 22.7 pg — ABNORMAL LOW (ref 26.0–34.0)
MCHC: 28.9 g/dL — ABNORMAL LOW (ref 30.0–36.0)
MCV: 78.8 fL — ABNORMAL LOW (ref 80.0–100.0)
Monocytes Absolute: 2.2 10*3/uL — ABNORMAL HIGH (ref 0.1–1.0)
Monocytes Relative: 14 %
Neutro Abs: 11.9 10*3/uL — ABNORMAL HIGH (ref 1.7–7.7)
Neutrophils Relative %: 74 %
Platelets: 282 10*3/uL (ref 150–400)
RBC: 2.55 MIL/uL — ABNORMAL LOW (ref 3.87–5.11)
RDW: 17.5 % — ABNORMAL HIGH (ref 11.5–15.5)
WBC: 16 10*3/uL — ABNORMAL HIGH (ref 4.0–10.5)
nRBC: 0 % (ref 0.0–0.2)

## 2022-08-21 LAB — GLUCOSE, CAPILLARY
Glucose-Capillary: 214 mg/dL — ABNORMAL HIGH (ref 70–99)
Glucose-Capillary: 254 mg/dL — ABNORMAL HIGH (ref 70–99)
Glucose-Capillary: 237 mg/dL — ABNORMAL HIGH (ref 70–99)
Glucose-Capillary: 243 mg/dL — ABNORMAL HIGH (ref 70–99)

## 2022-08-21 LAB — HEMOGLOBIN A1C
Hgb A1c MFr Bld: 7.5 % — ABNORMAL HIGH (ref 4.8–5.6)
Mean Plasma Glucose: 169 mg/dL

## 2022-08-21 LAB — BASIC METABOLIC PANEL
Anion gap: 7 (ref 5–15)
BUN: 39 mg/dL — ABNORMAL HIGH (ref 8–23)
CO2: 33 mmol/L — ABNORMAL HIGH (ref 22–32)
Calcium: 9.5 mg/dL (ref 8.9–10.3)
Chloride: 99 mmol/L (ref 98–111)
Creatinine, Ser: 0.96 mg/dL (ref 0.44–1.00)
GFR, Estimated: 58 mL/min — ABNORMAL LOW (ref >60)
Glucose, Bld: 262 mg/dL — ABNORMAL HIGH (ref 70–99)
Potassium: 4.1 mmol/L (ref 3.5–5.1)
Sodium: 139 mmol/L (ref 135–145)

## 2022-08-21 LAB — PREPARE RBC (CROSSMATCH)

## 2022-08-21 LAB — MAGNESIUM: Magnesium: 1.9 mg/dL (ref 1.7–2.4)

## 2022-08-21 LAB — URINE CULTURE: Culture: NO GROWTH

## 2022-08-21 MED ORDER — SODIUM CHLORIDE 0.9% IV SOLUTION: Freq: Once | INTRAVENOUS | Status: DC

## 2022-08-21 MED ORDER — TRANEXAMIC ACID-NACL 1000-0.7 MG/100ML-% IV SOLN
1000.0000 mg | INTRAVENOUS | Status: AC
  Administered 2022-08-22: 1000 mg via INTRAVENOUS

## 2022-08-21 MED ORDER — CEFAZOLIN SODIUM-DEXTROSE 2-4 GM/100ML-% IV SOLN
2.0000 g | INTRAVENOUS | Status: AC
  Administered 2022-08-22: 2 g via INTRAVENOUS

## 2022-08-21 MED ORDER — CHLORHEXIDINE GLUCONATE 4 % EX LIQD: 60.0000 mL | CUTANEOUS | Status: DC

## 2022-08-21 MED ORDER — SODIUM CHLORIDE 0.9% IV SOLUTION: Freq: Once | INTRAVENOUS | Status: AC

## 2022-08-21 MED ORDER — POVIDONE-IODINE 10 % EX SWAB: 2.0000 | Freq: Once | CUTANEOUS | Status: DC

## 2022-08-21 MED ORDER — INSULIN GLARGINE-YFGN 100 UNIT/ML ~~LOC~~ SOLN
20.0000 [IU] | Freq: Every day | SUBCUTANEOUS | Status: DC
  Administered 2022-08-21 – 2022-08-26 (×5): 20 [IU] via SUBCUTANEOUS
  Filled 2022-08-21 (×6): qty 0.2

## 2022-08-21 MED ORDER — VANCOMYCIN HCL 1500 MG/300ML IV SOLN
1500.0000 mg | INTRAVENOUS | Status: AC
  Administered 2022-08-22: 1500 mg via INTRAVENOUS
  Filled 2022-08-21: qty 300

## 2022-08-21 MED ORDER — INSULIN GLARGINE 100 UNITS/ML SOLOSTAR PEN: 20.0000 [IU] | PEN_INJECTOR | Freq: Every day | SUBCUTANEOUS | Status: DC

## 2022-08-21 NOTE — H&P (View-Only) (Signed)
     Subjective: Patient's hemoglobin down to 5.8 this morning.  Starting first unit blood transfusion.  Patient lying comfortably in bed no issues.  Spoke with power of attorney, [name] over the phone discussed plan for transfusion today and likely surgery tomorrow.  Objective:   VITALS:   Vitals:   08/20/22 2200 08/21/22 0516 08/21/22 0631 08/21/22 0701  BP: 113/71 101/68 101/62 116/63  Pulse: 72 90 93 86  Resp:  _0 Temp:  (!) 97.4 F (36.3 C) 98 F (36.7 C) 97.8 F (36.6 C)  TempSrc:  Oral Oral Oral  SpO2:  100% 91% 100%  Weight:      Height:        Intact pulses distally Dorsiflexion/Plantar flexion intact No cellulitis present Compartment soft Moderate swelling about the distal left thigh  Lab Results  Component Value Date   WBC 16.0 (H) 08/21/2022   HGB 5.8 (LL) 08/21/2022   HCT 20.1 (L) 08/21/2022   MCV 78.8 (L) 08/21/2022   PLT 282 08/21/2022   BMET    Component Value Date/Time   NA 139 08/21/2022 0343   NA 138 03/01/2017 1325   K 4.1 08/21/2022 0343   K 4.2 03/01/2017 1325   CL 99 08/21/2022 0343   CO2 33 (H) 08/21/2022 0343   CO2 27 03/01/2017 1325   GLUCOSE 262 (H) 08/21/2022 0343   GLUCOSE 135 03/01/2017 1325   BUN 39 (H) 08/21/2022 0343   BUN 22.5 03/01/2017 1325   CREATININE 0.96 08/21/2022 0343   CREATININE 0.9 03/01/2017 1325   CALCIUM 9.5 08/21/2022 0343   CALCIUM 10.1 10/29/2018 1007   CALCIUM 10.4 03/01/2017 1325   EGFR 58 (L) 03/01/2017 1325   GFRNONAA 58 (L) 08/21/2022 0343    Assessment/Plan: Day of Surgery   Principal Problem:   Displaced comminuted fracture of shaft of left femur, initial encounter for closed fracture (HCC) Active Problems:   OSA on CPAP   Hypothyroidism   History of CVA (cerebrovascular accident)   Chronic diastolic CHF (congestive heart failure) (HCC)   Bedbound   Obesity (BMI 30-39.9)   A-fib ([place])   Type 2 diabetes mellitus without complications ([place])   DNR (do not resuscitate)   Acute urinary  retention  Left periprosthetic distal femur fracture Acute blood loss anemia secondary to fracture  Patient received 2 units of blood transfusion today.  Assuming she is medically stable and optimized can proceed with open reduction for fixation of her distal femur fracture tomorrow.  Discussed goals of surgery being pain control, bed mobility and hygiene care by the nursing staff at the facility which she resides.  Risks of surgery including infection, nerve injury, bleeding, risks of anesthesia, nonunion, malunion, hardware failure.   Infant Zink A Devian Bartolomei 08/21/2022, 7:16 AM   Charlies Constable, MD  Contact information:   [phone] 7am-5pm epic message Dr. Zachery Dakins, or call office for patient follow up: [phone] After hours and holidays please check [email or web address] for group call information for [facility]

## 2022-08-21 NOTE — Evaluation (Addendum)
SLP Cancellation Note  Patient Details Name: Melinda Kerr MRN: [id number] DOB: [birth year]   Cancelled treatment:       Reason Eval/Treat Not Completed: Other (comment);Patient at procedure or test/unavailable (Pt is getting PICC line at this time; will continue efforts)  RN reports pt is coughing with water but tolerating solids.  She is to receive blood and is tentatively scheduled for orthopedic surgery 08/22/2022. Will continue efforts.   Kathleen Lime, MS [facility] SLP Acute Rehab Services Office [phone] Pager [phone]   [name] 08/21/2022, 3:30 PM   SLP attempted BSE again - pt still getting PICC.  1629

## 2022-08-21 NOTE — Progress Notes (Signed)
Pt blood transfusion halted this AM at 15 minute mark due to IV leaking. Max amount transfused 30 mL.

## 2022-08-21 NOTE — Progress Notes (Signed)
     Subjective: Patient's hemoglobin down to 5.8 this morning.  Starting first unit blood transfusion.  Patient lying comfortably in bed no issues.  Spoke with power of attorney, [name] over the phone discussed plan for transfusion today and likely surgery tomorrow.  Objective:   VITALS:   Vitals:   08/20/22 2200 08/21/22 0516 08/21/22 0631 08/21/22 0701  BP: 113/71 101/68 101/62 116/63  Pulse: 72 90 93 86  Resp:  20 15 20  Temp:  (!) 97.4 F (36.3 C) 98 F (36.7 C) 97.8 F (36.6 C)  TempSrc:  Oral Oral Oral  SpO2:  100% 91% 100%  Weight:      Height:        Intact pulses distally Dorsiflexion/Plantar flexion intact No cellulitis present Compartment soft Moderate swelling about the distal left thigh  Lab Results  Component Value Date   WBC 16.0 (H) 08/21/2022   HGB 5.8 (LL) 08/21/2022   HCT 20.1 (L) 08/21/2022   MCV 78.8 (L) 08/21/2022   PLT 282 08/21/2022   BMET    Component Value Date/Time   NA 139 08/21/2022 0343   NA 138 03/01/2017 1325   K 4.1 08/21/2022 0343   K 4.2 03/01/2017 1325   CL 99 08/21/2022 0343   CO2 33 (H) 08/21/2022 0343   CO2 27 03/01/2017 1325   GLUCOSE 262 (H) 08/21/2022 0343   GLUCOSE 135 03/01/2017 1325   BUN 39 (H) 08/21/2022 0343   BUN 22.5 03/01/2017 1325   CREATININE 0.96 08/21/2022 0343   CREATININE 0.9 03/01/2017 1325   CALCIUM 9.5 08/21/2022 0343   CALCIUM 10.1 10/29/2018 1007   CALCIUM 10.4 03/01/2017 1325   EGFR 58 (L) 03/01/2017 1325   GFRNONAA 58 (L) 08/21/2022 0343    Assessment/Plan: Day of Surgery   Principal Problem:   Displaced comminuted fracture of shaft of left femur, initial encounter for closed fracture (HCC) Active Problems:   OSA on CPAP   Hypothyroidism   History of CVA (cerebrovascular accident)   Chronic diastolic CHF (congestive heart failure) (HCC)   Bedbound   Obesity (BMI 30-39.9)   A-fib (HCC)   Type 2 diabetes mellitus without complications (HCC)   DNR (do not resuscitate)   Acute urinary  retention  Left periprosthetic distal femur fracture Acute blood loss anemia secondary to fracture  Patient received 2 units of blood transfusion today.  Assuming she is medically stable and optimized can proceed with open reduction for fixation of her distal femur fracture tomorrow.  Discussed goals of surgery being pain control, bed mobility and hygiene care by the nursing staff at the facility which she resides.  Risks of surgery including infection, nerve injury, bleeding, risks of anesthesia, nonunion, malunion, hardware failure.   Sitara Cashwell A Amma Crear 08/21/2022, 7:16 AM   Anvitha Hutmacher, MD  Contact information:   Weekdays 7am-5pm epic message Dr. Othal Kubitz, or call office for patient follow up: [phone] After hours and holidays please check [email or web address] for group call information for [facility]   

## 2022-08-21 NOTE — TOC Initial Note (Signed)
Transition of Care [facility]) - Initial/Assessment Note    Patient Details  Name: Melinda Kerr MRN: [id number] Date of Birth: 08/23/1935  Transition of [facility]) CM/SW Contact:    Lennart Pall, LCSW Phone Number: 08/21/2022, 2:20 PM  Clinical Narrative:                 Met with pt today and spoke with her niece/ POA as well as admissions coordinator with [facility].  All confirm plan for pt to return to [facility] when medically ready.  Pt has been a resident in [facility] bed for ~ 3 years.  Niece notes that pt is not ambulatory at baseline.  Explained that we will await therapy to evaluate her following surgery and would anticipate may be ready for d/c early next week.  All aware and agreeable with plan.  Expected Discharge Plan: Long Term Nursing Home Barriers to Discharge: Continued Medical Work up   Patient Goals and CMS Choice Patient states their goals for this hospitalization and ongoing recovery are:: return to SNF      Expected Discharge Plan and Services Expected Discharge Plan: [place] In-house Referral: Clinical Social Work     Living arrangements for the past 2 months: [place]                 DME Arranged: N/A DME Agency: NA                  Prior Living Arrangements/Services Living arrangements for the past 2 months: [place] Lives with:: Facility Resident Patient language and need for interpreter reviewed:: Yes Do you feel safe going back to the place where you live?: Yes      Need for Family Participation in Patient Care: No (Comment) Care giver support system in place?: Yes (comment)   Criminal Activity/Legal Involvement Pertinent to Current Situation/Hospitalization: No - Comment as needed  Activities of Daily Living Home Assistive Devices/Equipment: None ADL Screening (condition at time of admission) Patient's cognitive ability adequate to safely complete daily activities?: No Is the patient  deaf or have difficulty hearing?: No Does the patient have difficulty seeing, even when wearing glasses/contacts?: No Does the patient have difficulty concentrating, remembering, or making decisions?: Yes Patient able to express need for assistance with ADLs?: No Does the patient have difficulty dressing or bathing?: Yes Independently performs ADLs?: No Communication: Independent Dressing (OT): Dependent Is this a change from baseline?: Pre-admission baseline Grooming: Dependent Is this a change from baseline?: Pre-admission baseline Feeding: Needs assistance Is this a change from baseline?: Pre-admission baseline Bathing: Dependent Is this a change from baseline?: Pre-admission baseline Toileting: Dependent Is this a change from baseline?: Pre-admission baseline In/Out Bed: Dependent Is this a change from baseline?: Pre-admission baseline Walks in Home: Dependent Is this a change from baseline?: Pre-admission baseline Does the patient have difficulty walking or climbing stairs?: Yes Weakness of Legs: Both Weakness of Arms/Hands: Both  Permission Sought/Granted Permission sought to share information with : Facility Sport and exercise psychologist, Family Supports Permission granted to share information with : Yes, Verbal Permission Granted  Share Information with NAME: Keenan Bachelor  Permission granted to share info w AGENCY: [facility]  Permission granted to share info w Relationship: niece/ POA  Permission granted to share info w Contact Information: [phone]  Emotional Assessment Appearance:: Appears stated age Attitude/Demeanor/Rapport: Gracious Affect (typically observed): Accepting Orientation: : Oriented to Self Alcohol / Substance Use: Not Applicable Psych Involvement: No (comment)  Admission  diagnosis:  Metabolic alkalosis [W58.0] Normocytic anemia [D64.9] Thrombocytosis [D75.839] Atrial fibrillation with controlled ventricular response ([place]) [I48.91] Fall at nursing home,  initial encounter [W19.XXXA, Y92.129] Closed fracture of distal end of left femur, initial encounter ([place]) [S72.402A] Displaced comminuted fracture of shaft of left femur, initial encounter for closed fracture ([place]) [S72.352A] Leukocytosis, unspecified type [D72.829] Patient Active Problem List   Diagnosis Date Noted   Displaced comminuted fracture of shaft of left femur, initial encounter for closed fracture ([place]) 08/20/2022   Bedbound 08/20/2022   Obesity (BMI 30-39.9) 08/20/2022   A-fib ([place]) 08/20/2022   Type 2 diabetes mellitus without complications ([place]) 99/83/3825   DNR (do not resuscitate) 08/20/2022   Acute urinary retention 08/20/2022   Chronic diastolic CHF (congestive heart failure) ([place]) 03/06/2021   Prolonged QT interval 02/04/2021   History of CVA (cerebrovascular accident) 02/04/2021   Closed fracture of right distal femur ([place]) 05/08/2020   Anemia associated with acute blood loss 11/13/2018   Closed fracture of right proximal humerus 10/26/2018   S/P reverse total shoulder arthroplasty, right 10/26/2018   Diverticulitis of large intestine with abscess 07/13/2017   Aortic atherosclerosis ([place]) 09/12/2016   Coronary artery calcification 09/12/2016   Hiatal hernia 09/12/2016   Lumbar disc disease 09/12/2016   Chemotherapy-induced thrombocytopenia 08/04/2016   Chemotherapy induced neutropenia ([place]) 05/26/2016   Port catheter in place 05/26/2016   Encounter for antineoplastic chemotherapy 05/26/2016   Urinary frequency 04/24/2016   Obstructive sleep apnea on CPAP 04/24/2016   History of DVT (deep vein thrombosis) 04/24/2016   Endometrial cancer, FIGO stage IIIC ([place]) 04/24/2016   Malignant neoplasm of endometrium ([place]) 03/26/2016   Dyspnea on exertion 03/19/2016   Malignant neoplasm of uterus ([place]) 03/19/2016   Status post right knee replacement 04/16/2014   Essential hypertension 04/16/2014   Postoperative anemia due to acute blood loss 04/12/2014   Status post  total left knee replacement 10/14/2013   Hypothyroidism 10/14/2013   UI (urinary incontinence) 10/14/2013   Dyslipidemia 10/14/2013   Constipation 10/14/2013   Gout 10/14/2013   Anemia 10/14/2013   Allergic rhinitis 10/14/2013   Insomnia 10/14/2013   Depression 10/14/2013   OA (osteoarthritis) of knee 10/09/2013   OSA on CPAP 08/10/2013   PCP:  Maury Dus, MD Pharmacy:   [place], [place] - [address] [place] [address] Phone: [phone] Fax: [phone]     Social Determinants of Health (SDOH) Interventions    Readmission Risk Interventions    08/21/2022    2:17 PM 02/06/2021    2:54 PM 05/10/2020   12:58 PM  Readmission Risk Prevention Plan  Transportation Screening Complete Complete Complete  PCP or Specialist Appt within 5-7 Days Complete Complete Complete  Home Care Screening Complete Complete Complete  Medication Review (RN CM) Complete Complete Complete

## 2022-08-21 NOTE — Progress Notes (Signed)
Progress Note    Melinda Kerr   KXF:[id number]  DOB: 1935/07/27  DOA: 08/20/2022     1 PCP: Maury Dus, MD  Initial CC: fall out of bed  Hospital Course: Melinda Kerr is an 87 yo female with PMH afib, dementia, obesity, chronic dCHF, CVA, DMII who presented after a fall out of the bed. She resides at [facility]. She was brought to the ER after her fall.  X-rays and CT head obtained on admission.  Imaging shows left comminuted displaced distal femoral fracture.  She was admitted for evaluation with orthopedic surgery.  Interval History:  Difficulty with IV access this morning.  Hemoglobin also down trended.  Some increased right thigh bruising and pain. Blood has been ordered and surgery postponed until tomorrow.  Assessment and Plan: * Displaced comminuted fracture of shaft of left femur, initial encounter for closed fracture ([place]) - s/p fall from bed; likely some contribution from her dementia - xray shows left comminuted displaced distal femoral fracture -Continue immobilizer and bedrest -Tentative plan for surgical repair on 08/22/2022 (postponed due to worsened anemia) - continue pain control   A-fib ([place]) - patient does have prior history of afib; noted on EKG on 02/03/21; at the time she had repair of original left femoral fracture; likely had afib precipitated by the acute stress - she's not been on anticoagulation and I agree, risk outweighs benefit given age and bleed risk - rate control strategy will be pursued; still some RVR on admission - TSH normal, 1.007 - start lopressor - repeat echo now: EF 60-65%, no RWMA, mild LVH, indeterminate diastology (was Gr II DD previously on May 2022 echo); overall her echo is stable and unchanged - as noted on H&P, RCRI 6.6%, but she is considered appropriate risk for repair given periprosthetic fracture and the pain control that repair would provide  Chronic diastolic CHF (congestive heart failure) ([place]) - no s/s exacerbation -  noted to have Gr 2 DD on May 2022 echo; repeat echo unable to appreciate diastology; EF remains preserved at 60-65%  Acute urinary retention - not unexpected given dementia and hip fx at this time - foley attempted by nursing multiple times without success - bladder scan ~230pm showed 212 cc - for now continue on q6h bladder scans; if retains >350, then may need to ask for urology assistance for foley placement (I've discussed with urology)  DNR (do not resuscitate) Verified DNR orders from SNF order sheet  Type 2 diabetes mellitus without complications ([place]) - I9C 7.5% - continue SSI and CBG monitoring  Obesity (BMI 78-93.8) - Complicates overall prognosis and care - Body mass index is 37.46 kg/m.  Bedbound Chronic.  History of CVA (cerebrovascular accident) Chronic. - s/p surgery, would be reasonable to continue on asa   Microcytic anemia - Baseline hemoglobin around 9 to 10 g/dL.  Admitted with hemoglobin 8.6 g/dL with further downtrend this morning to 5.8 g/dL - Etiology suspected due to bleeding from fall - Right hip actually more edematous and bruised. -Continue monitoring both thighs for any signs of possible compartment syndrome - 2 units PRBC ordered 08/21/2022 - PICC ordered for difficult IV access  Hypothyroidism Stable. Continue synthroid 125 mcg daily - TSH normal  OSA on CPAP Stable. Continue CPAP.   Old records reviewed in assessment of this patient  Antimicrobials:   DVT prophylaxis:  SCDs Start: 08/20/22 0731   Code Status:   Code Status: DNR  Mobility Assessment (last 72 hours)     Mobility  Assessment     Row Name 08/21/22 0739 08/20/22 0945 08/20/22 0650       Does patient have an order for bedrest or is patient medically unstable Yes- Bedfast (Level 1) - Complete Yes- Bedfast (Level 1) - Complete Yes- Bedfast (Level 1) - Complete              Barriers to discharge:  Disposition Plan:  [facility] Status is:  Inpt  Objective: Blood pressure (!) 115/59, pulse 84, temperature 98.4 F (36.9 C), temperature source Oral, resp. rate 18, height '5\' 5"'$  (1.651 m), weight 102.1 kg, SpO2 90 %.  Examination:  Physical Exam Constitutional:      General: She is not in acute distress.    Appearance: Normal appearance.  HENT:     Head: Normocephalic and atraumatic.     Mouth/Throat:     Mouth: Mucous membranes are moist.  Eyes:     Extraocular Movements: Extraocular movements intact.  Cardiovascular:     Rate and Rhythm: Normal rate. Rhythm irregular.  Pulmonary:     Effort: Pulmonary effort is normal.     Breath sounds: Normal breath sounds.  Abdominal:     General: Bowel sounds are normal. There is no distension.     Palpations: Abdomen is soft.     Tenderness: There is no abdominal tenderness.  Musculoskeletal:     Cervical back: Normal range of motion.     Comments: LLE noted in knee immobilizer.  Right thigh appreciated with increased bruising in lower thigh and upper leg with some tenderness to palpation.  Compartments remain soft.  Skin:    General: Skin is warm and dry.  Neurological:     Mental Status: She is alert. Mental status is at baseline.  Psychiatric:        Mood and Affect: Mood normal.      Consultants:  Orthopedic surgery  Procedures:    Data Reviewed: Results for orders placed or performed during the hospital encounter of 08/20/22 (from the past 24 hour(s))  Glucose, capillary     Status: Abnormal   Collection Time: 08/20/22  5:01 PM  Result Value Ref Range   Glucose-Capillary 265 (H) 70 - 99 mg/dL  Urinalysis, Routine w reflex microscopic Urine, Clean Catch     Status: Abnormal   Collection Time: 08/20/22  5:18 PM  Result Value Ref Range   Color, Urine YELLOW YELLOW   APPearance CLEAR CLEAR   Specific Gravity, Urine 1.016 1.005 - 1.030   pH 5.0 5.0 - 8.0   Glucose, UA NEGATIVE NEGATIVE mg/dL   Hgb urine dipstick NEGATIVE NEGATIVE   Bilirubin Urine NEGATIVE  NEGATIVE   Ketones, ur NEGATIVE NEGATIVE mg/dL   Protein, ur NEGATIVE NEGATIVE mg/dL   Nitrite NEGATIVE NEGATIVE   Leukocytes,Ua TRACE (A) NEGATIVE   RBC / HPF 0-5 0 - 5 RBC/hpf   WBC, UA 6-10 0 - 5 WBC/hpf   Bacteria, UA NONE SEEN NONE SEEN   Squamous Epithelial / LPF 0-5 0 - 5   Hyaline Casts, UA PRESENT   Glucose, capillary     Status: Abnormal   Collection Time: 08/20/22 10:02 PM  Result Value Ref Range   Glucose-Capillary 278 (H) 70 - 99 mg/dL   Comment 1 Notify RN    Comment 2 Document in Chart   Basic metabolic panel     Status: Abnormal   Collection Time: 08/21/22  3:43 AM  Result Value Ref Range   Sodium 139 135 - 145 mmol/L  Potassium 4.1 3.5 - 5.1 mmol/L   Chloride 99 98 - 111 mmol/L   CO2 33 (H) 22 - 32 mmol/L   Glucose, Bld 262 (H) 70 - 99 mg/dL   BUN 39 (H) 8 - 23 mg/dL   Creatinine, Ser 0.96 0.44 - 1.00 mg/dL   Calcium 9.5 8.9 - 10.3 mg/dL   GFR, Estimated 58 (L) >60 mL/min   Anion gap 7 5 - 15  CBC with Differential/Platelet     Status: Abnormal   Collection Time: 08/21/22  3:43 AM  Result Value Ref Range   WBC 16.0 (H) 4.0 - 10.5 K/uL   RBC 2.55 (L) 3.87 - 5.11 MIL/uL   Hemoglobin 5.8 (LL) 12.0 - 15.0 g/dL   HCT 20.1 (L) 36.0 - 46.0 %   MCV 78.8 (L) 80.0 - 100.0 fL   MCH 22.7 (L) 26.0 - 34.0 pg   MCHC 28.9 (L) 30.0 - 36.0 g/dL   RDW 17.5 (H) 11.5 - 15.5 %   Platelets 282 150 - 400 K/uL   nRBC 0.0 0.0 - 0.2 %   Neutrophils Relative % 74 %   Neutro Abs 11.9 (H) 1.7 - 7.7 K/uL   Lymphocytes Relative 11 %   Lymphs Abs 1.8 0.7 - 4.0 K/uL   Monocytes Relative 14 %   Monocytes Absolute 2.2 (H) 0.1 - 1.0 K/uL   Eosinophils Relative 0 %   Eosinophils Absolute 0.0 0.0 - 0.5 K/uL   Basophils Relative 0 %   Basophils Absolute 0.0 0.0 - 0.1 K/uL   Immature Granulocytes 1 %   Abs Immature Granulocytes 0.10 (H) 0.00 - 0.07 K/uL  Magnesium     Status: None   Collection Time: 08/21/22  3:43 AM  Result Value Ref Range   Magnesium 1.9 1.7 - 2.4 mg/dL  Prepare  RBC (crossmatch)     Status: None   Collection Time: 08/21/22  4:47 AM  Result Value Ref Range   Order Confirmation      ORDER PROCESSED BY BLOOD BANK Performed at [facility], [address], [place], [place] [address]   Glucose, capillary     Status: Abnormal   Collection Time: 08/21/22  7:27 AM  Result Value Ref Range   Glucose-Capillary 214 (H) 70 - 99 mg/dL  Glucose, capillary     Status: Abnormal   Collection Time: 08/21/22 12:58 PM  Result Value Ref Range   Glucose-Capillary 254 (H) 70 - 99 mg/dL    I have Reviewed nursing notes, Vitals, and Lab results since pt's last encounter. Pertinent lab results : see above I have ordered test including BMP, CBC, Mg I have reviewed the last note from staff over past 24 hours I have discussed pt's care plan and test results with nursing staff, case manager  Time spent: Greater than 50% of the 55 minute visit was spent in counseling/coordination of care for the patient as laid out in the A&P.    LOS: 1 day   Dwyane Dee, MD [facility] 08/21/2022, 2:36 PM

## 2022-08-21 NOTE — Progress Notes (Signed)
CXR images personally reviewed, central line in good position above the right atrium.     Noe Gens, MSN, APRN, NP-C, AGACNP-BC [facility] 08/21/2022, 6:48 PM   Please see [email or web address] for pager details.   From 7A-7P if no response, please call [phone] After hours, please call ELink [phone]

## 2022-08-21 NOTE — Progress Notes (Signed)
Date and time results received: 08/21/22 04:10  Test: Hgb Critical Value: 5.8  Name of Provider Notified: Raenette Rover, PA-C  Orders Received? Or Actions Taken?: Orders Received - See Orders for details

## 2022-08-21 NOTE — Progress Notes (Signed)
Attempted PICC placement in the left upper arm x's 2 without success. Catheter would not pass the should area. Also attempted to place midline times 2 in both upper arms without success. Patient has very deep small torturous veins. We would recommend central line be place by IR if access is still needed. Thank you.

## 2022-08-21 NOTE — Assessment & Plan Note (Addendum)
-   Baseline hemoglobin around 9 to 10 g/dL.  Admitted with hemoglobin 8.6 g/dL with further downtrend to 5.8 g/dL - Etiology suspected due to bleeding from fall - Right hip actually more edematous and bruised. -Continue monitoring both thighs for any signs of possible compartment syndrome - s/p 2  units PRBC on 11/24 and again 2 units PRBC on 11/25 - required RIJ placement as PICC was unable to be placed - continue trending H/H -Hemoglobin appears to have stabilized

## 2022-08-21 NOTE — Inpatient Diabetes Management (Signed)
Inpatient Diabetes Program Recommendations  AACE/ADA: New Consensus Statement on Inpatient Glycemic Control (2015)  Target Ranges:  Prepandial:   less than 140 mg/dL      Peak postprandial:   less than 180 mg/dL (1-2 hours)      Critically ill patients:  140 - 180 mg/dL   Lab Results  Component Value Date   GLUCAP 214 (H) 08/21/2022   HGBA1C 7.5 (H) 08/20/2022    Latest Reference Range & Units 08/20/22 17:01 08/20/22 22:02 08/21/22 07:27  Glucose-Capillary 70 - 99 mg/dL 265 (H) 278 (H) 214 (H)  (H): Data is abnormally high Review of Glycemic Control  Diabetes history: DM2 Outpatient Diabetes medications: Lantus 30 units at HS, Humalog 5 units TID Current orders for Inpatient glycemic control: Novolog 0-9 units TID correction scale, Novolog 0-5 units HS scale  Inpatient Diabetes Program Recommendations:   Noted that blood sugars have been greater than 200 mg/dl.   Recommend adding Semglee 15 units daily (1/2 of home dose of Lantus) and continue Novolog SENSITIVE correction scale and HS scale as ordered if blood sugars continue to be elevated.  Harvel Ricks RN BSN CDE Diabetes Coordinator Pager: [phone]  8am-5pm

## 2022-08-21 NOTE — Procedures (Signed)
Central Venous Catheter Insertion Procedure Note  KAETLIN BULLEN  [id number]  [birth year]  Date:08/21/22  Time:6:42 PM   Provider Performing:Inika Bellanger Alfredo Martinez   Procedure: Insertion of Non-tunneled Central Venous [phone]) with US guidance ([id number])   Indication(s) Difficult access  Consent Risks of the procedure as well as the alternatives and risks of each were explained to the patient and/or caregiver.  Consent for the procedure was obtained and is signed in the bedside chart  Anesthesia Topical only with 1% lidocaine   Timeout Verified patient identification, verified procedure, site/side was marked, verified correct patient position, special equipment/implants available, medications/allergies/relevant history reviewed, required imaging and test results available.  Sterile Technique Maximal sterile technique including full sterile barrier drape, hand hygiene, sterile gown, sterile gloves, mask, hair covering, sterile ultrasound probe cover (if used).  Procedure Description Area of catheter insertion was cleaned with chlorhexidine and draped in sterile fashion.  With real-time ultrasound guidance a central venous catheter was placed into the right internal jugular vein. Nonpulsatile blood flow and easy flushing noted in all ports.  The catheter was sutured in place and sterile dressing applied.   R IJ [place] assessment > wire in vessel.   Complications/Tolerance None; patient tolerated the procedure well. Chest X-ray is ordered to verify placement for internal jugular or subclavian cannulation.   Chest x-ray is not ordered for femoral cannulation.  EBL Minimal  Specimen(s) None   Noe Gens, MSN, APRN, NP-C, AGACNP-BC [facility] 08/21/2022, 6:43 PM   Please see [email or web address] for pager details.   From 7A-7P if no response, please call [phone] After hours, please call ELink [phone]

## 2022-08-21 NOTE — Plan of Care (Signed)
  Problem: Education: Goal: Knowledge of General Education information will improve Description: Including pain rating scale, medication(s)/side effects and non-pharmacologic comfort measures Outcome: Progressing   Problem: Clinical Measurements: Goal: Cardiovascular complication will be avoided Outcome: Progressing   Problem: Nutrition: Goal: Adequate nutrition will be maintained Outcome: Progressing

## 2022-08-22 ENCOUNTER — Other Ambulatory Visit: Payer: Self-pay

## 2022-08-22 ENCOUNTER — Inpatient Hospital Stay (HOSPITAL_COMMUNITY): Payer: Medicare Other | Admitting: Anesthesiology

## 2022-08-22 ENCOUNTER — Inpatient Hospital Stay (HOSPITAL_COMMUNITY): Payer: Medicare Other

## 2022-08-22 ENCOUNTER — Encounter (HOSPITAL_COMMUNITY)
Admission: EM | Disposition: A | Discharge status: Skilled Nursing Facility | Payer: Self-pay | Source: Skilled Nursing Facility | Attending: Internal Medicine

## 2022-08-22 DIAGNOSIS — M9712XA Periprosthetic fracture around internal prosthetic left knee joint, initial encounter: Secondary | ICD-10-CM | POA: Diagnosis not present

## 2022-08-22 DIAGNOSIS — G4733 Obstructive sleep apnea (adult) (pediatric): Secondary | ICD-10-CM | POA: Diagnosis not present

## 2022-08-22 DIAGNOSIS — S72352A Displaced comminuted fracture of shaft of left femur, initial encounter for closed fracture: Secondary | ICD-10-CM | POA: Diagnosis not present

## 2022-08-22 DIAGNOSIS — R338 Other retention of urine: Secondary | ICD-10-CM | POA: Diagnosis not present

## 2022-08-22 DIAGNOSIS — I4891 Unspecified atrial fibrillation: Secondary | ICD-10-CM | POA: Diagnosis not present

## 2022-08-22 DIAGNOSIS — D509 Iron deficiency anemia, unspecified: Secondary | ICD-10-CM | POA: Diagnosis not present

## 2022-08-22 DIAGNOSIS — Z9989 Dependence on other enabling machines and devices: Secondary | ICD-10-CM | POA: Diagnosis not present

## 2022-08-22 HISTORY — PX: ORIF FEMUR FRACTURE: SHX2119

## 2022-08-22 LAB — PREPARE RBC (CROSSMATCH)

## 2022-08-22 LAB — CBC WITH DIFFERENTIAL/PLATELET
Abs Immature Granulocytes: 0.3 10*3/uL — ABNORMAL HIGH (ref 0.00–0.07)
Basophils Absolute: 0 10*3/uL (ref 0.0–0.1)
Basophils Relative: 0 %
Eosinophils Absolute: 0 10*3/uL (ref 0.0–0.5)
Eosinophils Relative: 0 %
HCT: 23.5 % — ABNORMAL LOW (ref 36.0–46.0)
Hemoglobin: 7.4 g/dL — ABNORMAL LOW (ref 12.0–15.0)
Immature Granulocytes: 2 %
Lymphocytes Relative: 8 %
Lymphs Abs: 1.5 10*3/uL (ref 0.7–4.0)
MCH: 25.2 pg — ABNORMAL LOW (ref 26.0–34.0)
MCHC: 31.5 g/dL (ref 30.0–36.0)
MCV: 79.9 fL — ABNORMAL LOW (ref 80.0–100.0)
Monocytes Absolute: 2 10*3/uL — ABNORMAL HIGH (ref 0.1–1.0)
Monocytes Relative: 11 %
Neutro Abs: 14.2 10*3/uL — ABNORMAL HIGH (ref 1.7–7.7)
Neutrophils Relative %: 79 %
Platelets: 244 10*3/uL (ref 150–400)
RBC: 2.94 MIL/uL — ABNORMAL LOW (ref 3.87–5.11)
RDW: 17.3 % — ABNORMAL HIGH (ref 11.5–15.5)
WBC: 18 10*3/uL — ABNORMAL HIGH (ref 4.0–10.5)
nRBC: 0.2 % (ref 0.0–0.2)

## 2022-08-22 LAB — GLUCOSE, CAPILLARY
Glucose-Capillary: 205 mg/dL — ABNORMAL HIGH (ref 70–99)
Glucose-Capillary: 214 mg/dL — ABNORMAL HIGH (ref 70–99)
Glucose-Capillary: 223 mg/dL — ABNORMAL HIGH (ref 70–99)
Glucose-Capillary: 219 mg/dL — ABNORMAL HIGH (ref 70–99)

## 2022-08-22 LAB — CBC
HCT: 29.3 % — ABNORMAL LOW (ref 36.0–46.0)
Hemoglobin: 9.4 g/dL — ABNORMAL LOW (ref 12.0–15.0)
MCH: 26.6 pg (ref 26.0–34.0)
MCHC: 32.1 g/dL (ref 30.0–36.0)
MCV: 83 fL (ref 80.0–100.0)
Platelets: 201 10*3/uL (ref 150–400)
RBC: 3.53 MIL/uL — ABNORMAL LOW (ref 3.87–5.11)
RDW: 17.1 % — ABNORMAL HIGH (ref 11.5–15.5)
WBC: 31.1 10*3/uL — ABNORMAL HIGH (ref 4.0–10.5)
nRBC: 0.3 % — ABNORMAL HIGH (ref 0.0–0.2)

## 2022-08-22 LAB — BASIC METABOLIC PANEL
Anion gap: 6 (ref 5–15)
BUN: 39 mg/dL — ABNORMAL HIGH (ref 8–23)
CO2: 33 mmol/L — ABNORMAL HIGH (ref 22–32)
Calcium: 9.3 mg/dL (ref 8.9–10.3)
Chloride: 101 mmol/L (ref 98–111)
Creatinine, Ser: 0.74 mg/dL (ref 0.44–1.00)
GFR, Estimated: >60 mL/min (ref >60)
Glucose, Bld: 236 mg/dL — ABNORMAL HIGH (ref 70–99)
Potassium: 3.5 mmol/L (ref 3.5–5.1)
Sodium: 140 mmol/L (ref 135–145)

## 2022-08-22 LAB — MAGNESIUM: Magnesium: 2 mg/dL (ref 1.7–2.4)

## 2022-08-22 SURGERY — OPEN REDUCTION INTERNAL FIXATION (ORIF) DISTAL FEMUR FRACTURE
Anesthesia: General | Site: Leg Upper | Laterality: Left

## 2022-08-22 MED ORDER — ACETAMINOPHEN 10 MG/ML IV SOLN
INTRAVENOUS | Status: AC
  Filled 2022-08-22: qty 100

## 2022-08-22 MED ORDER — STERILE WATER FOR IRRIGATION IR SOLN
Status: DC | PRN
  Administered 2022-08-22: 1000 mL

## 2022-08-22 MED ORDER — SODIUM CHLORIDE 0.9% FLUSH
10.0000 mL | INTRAVENOUS | Status: DC | PRN
  Administered 2022-08-24: 30 mL

## 2022-08-22 MED ORDER — FENTANYL CITRATE (PF) 100 MCG/2ML IJ SOLN
INTRAMUSCULAR | Status: AC
  Filled 2022-08-22: qty 2

## 2022-08-22 MED ORDER — TRANEXAMIC ACID-NACL 1000-0.7 MG/100ML-% IV SOLN
INTRAVENOUS | Status: AC
  Filled 2022-08-22: qty 100

## 2022-08-22 MED ORDER — 0.9 % SODIUM CHLORIDE (POUR BTL) OPTIME
TOPICAL | Status: DC | PRN
  Administered 2022-08-22: 1000 mL

## 2022-08-22 MED ORDER — BUPIVACAINE HCL (PF) 0.25 % IJ SOLN
INTRAMUSCULAR | Status: DC | PRN
  Administered 2022-08-22: 30 mL

## 2022-08-22 MED ORDER — DEXAMETHASONE SODIUM PHOSPHATE 10 MG/ML IJ SOLN
INTRAMUSCULAR | Status: AC
  Filled 2022-08-22: qty 1

## 2022-08-22 MED ORDER — ONDANSETRON HCL 4 MG/2ML IJ SOLN
INTRAMUSCULAR | Status: DC | PRN
  Administered 2022-08-22: 4 mg via INTRAVENOUS

## 2022-08-22 MED ORDER — BUPIVACAINE HCL 0.25 % IJ SOLN
INTRAMUSCULAR | Status: AC
  Filled 2022-08-22: qty 1

## 2022-08-22 MED ORDER — ROCURONIUM BROMIDE 10 MG/ML (PF) SYRINGE
PREFILLED_SYRINGE | INTRAVENOUS | Status: DC | PRN
  Administered 2022-08-22: 100 mg via INTRAVENOUS

## 2022-08-22 MED ORDER — FENTANYL CITRATE (PF) 100 MCG/2ML IJ SOLN
INTRAMUSCULAR | Status: DC | PRN
  Administered 2022-08-22: 25 ug via INTRAVENOUS

## 2022-08-22 MED ORDER — VANCOMYCIN HCL 1000 MG IV SOLR
INTRAVENOUS | Status: DC | PRN
  Administered 2022-08-22: 1000 mg via TOPICAL

## 2022-08-22 MED ORDER — ROCURONIUM BROMIDE 10 MG/ML (PF) SYRINGE
PREFILLED_SYRINGE | INTRAVENOUS | Status: AC
  Filled 2022-08-22: qty 10

## 2022-08-22 MED ORDER — CEFAZOLIN SODIUM-DEXTROSE 2-4 GM/100ML-% IV SOLN
2.0000 g | Freq: Three times a day (TID) | INTRAVENOUS | Status: AC
  Administered 2022-08-22 (×2): 2 g via INTRAVENOUS
  Filled 2022-08-22 (×2): qty 100

## 2022-08-22 MED ORDER — SODIUM CHLORIDE 0.9% FLUSH
10.0000 mL | Freq: Two times a day (BID) | INTRAVENOUS | Status: DC
  Administered 2022-08-22 – 2022-08-26 (×7): 10 mL
  Administered 2022-08-26: 20 mL
  Administered 2022-08-27 – 2022-08-29 (×4): 10 mL
  Administered 2022-08-30: 40 mL

## 2022-08-22 MED ORDER — SUGAMMADEX SODIUM 200 MG/2ML IV SOLN
INTRAVENOUS | Status: DC | PRN
  Administered 2022-08-22: 200 mg via INTRAVENOUS

## 2022-08-22 MED ORDER — ACETAMINOPHEN 325 MG PO TABS
650.0000 mg | ORAL_TABLET | Freq: Four times a day (QID) | ORAL | Status: DC | PRN
  Administered 2022-08-23 – 2022-08-26 (×4): 650 mg via ORAL
  Filled 2022-08-22 (×4): qty 2

## 2022-08-22 MED ORDER — ACETAMINOPHEN 10 MG/ML IV SOLN
INTRAVENOUS | Status: DC | PRN
  Administered 2022-08-22: 1000 mg via INTRAVENOUS

## 2022-08-22 MED ORDER — SUCCINYLCHOLINE CHLORIDE 200 MG/10ML IV SOSY
PREFILLED_SYRINGE | INTRAVENOUS | Status: AC
  Filled 2022-08-22: qty 10

## 2022-08-22 MED ORDER — CEFAZOLIN SODIUM-DEXTROSE 2-4 GM/100ML-% IV SOLN
INTRAVENOUS | Status: AC
  Filled 2022-08-22: qty 100

## 2022-08-22 MED ORDER — VANCOMYCIN HCL 1000 MG IV SOLR
INTRAVENOUS | Status: AC
  Filled 2022-08-22: qty 20

## 2022-08-22 MED ORDER — PROPOFOL 10 MG/ML IV BOLUS
INTRAVENOUS | Status: AC
  Filled 2022-08-22: qty 20

## 2022-08-22 MED ORDER — PROPOFOL 10 MG/ML IV BOLUS
INTRAVENOUS | Status: DC | PRN
  Administered 2022-08-22: 50 mg via INTRAVENOUS

## 2022-08-22 MED ORDER — PHENYLEPHRINE HCL-NACL 20-0.9 MG/250ML-% IV SOLN
INTRAVENOUS | Status: DC | PRN
  Administered 2022-08-22: 50 ug/min via INTRAVENOUS

## 2022-08-22 MED ORDER — ETOMIDATE 2 MG/ML IV SOLN
INTRAVENOUS | Status: AC
  Filled 2022-08-22: qty 10

## 2022-08-22 MED ORDER — LACTATED RINGERS IV SOLN: INTRAVENOUS | Status: DC | PRN

## 2022-08-22 MED ORDER — ISOPROPYL ALCOHOL 70 % SOLN
Status: AC
  Filled 2022-08-22: qty 480

## 2022-08-22 MED ORDER — PROPOFOL 500 MG/50ML IV EMUL
INTRAVENOUS | Status: DC | PRN
  Administered 2022-08-22: 100 ug/kg/min via INTRAVENOUS

## 2022-08-22 MED ORDER — FENTANYL CITRATE PF 50 MCG/ML IJ SOSY: 25.0000 ug | PREFILLED_SYRINGE | INTRAMUSCULAR | Status: DC | PRN

## 2022-08-22 SURGICAL SUPPLY — 70 items
ADH SKN CLS APL DERMABOND .7 (GAUZE/BANDAGES/DRESSINGS) ×1
APL PRP STRL LF DISP 70% ISPRP (MISCELLANEOUS) ×1
BAG COUNTER SPONGE SURGICOUNT (BAG) ×2 IMPLANT
BAG SPNG CNTER NS LX DISP (BAG) ×1
BIT DRILL 4.3 (BIT) ×1
BIT DRILL 4.3X300MM (BIT) IMPLANT
BIT DRILL LONG 3.3 (BIT) IMPLANT
BIT DRILL QC 3.3X195 (BIT) IMPLANT
BLADE CLIPPER SURG (BLADE) IMPLANT
BLADE SAW SAG 25X90X1.19 (BLADE) IMPLANT
BNDG CMPR MED 10X6 ELC LF (GAUZE/BANDAGES/DRESSINGS) ×1
BNDG ELASTIC 6X10 VLCR STRL LF (GAUZE/BANDAGES/DRESSINGS) IMPLANT
BNDG GAUZE DERMACEA FLUFF 4 (GAUZE/BANDAGES/DRESSINGS) IMPLANT
BNDG GZE DERMACEA 4 6PLY (GAUZE/BANDAGES/DRESSINGS) ×2
CAP LOCK NCB (Cap) IMPLANT
CHLORAPREP W/TINT 26 (MISCELLANEOUS) ×2 IMPLANT
DERMABOND ADVANCED .7 DNX12 (GAUZE/BANDAGES/DRESSINGS) ×2 IMPLANT
DRAPE C-ARM 42X120 X-RAY (DRAPES) ×2 IMPLANT
DRAPE HIP W/POCKET STRL (MISCELLANEOUS) ×2 IMPLANT
DRAPE IMP U-DRAPE 54X76 (DRAPES) ×2 IMPLANT
DRAPE ORTHO SPLIT 77X108 STRL (DRAPES) ×2
DRAPE SURG ORHT 6 SPLT 77X108 (DRAPES) ×4 IMPLANT
DRAPE U-SHAPE 47X51 STRL (DRAPES) ×2 IMPLANT
DRSG ADAPTIC 3X8 NADH LF (GAUZE/BANDAGES/DRESSINGS) IMPLANT
DRSG MEPILEX POST OP 4X12 (GAUZE/BANDAGES/DRESSINGS) ×2 IMPLANT
DRSG TEGADERM 4X10 (GAUZE/BANDAGES/DRESSINGS) IMPLANT
ELECT REM PT RETURN 15FT ADLT (MISCELLANEOUS) ×2 IMPLANT
GAUZE PAD ABD 8X10 STRL (GAUZE/BANDAGES/DRESSINGS) ×4 IMPLANT
GAUZE SPONGE 4X4 12PLY STRL (GAUZE/BANDAGES/DRESSINGS) ×2 IMPLANT
GLOVE BIO SURGEON STRL SZ7.5 (GLOVE) ×2 IMPLANT
GLOVE BIOGEL PI IND STRL 7.5 (GLOVE) ×2 IMPLANT
GLOVE BIOGEL PI IND STRL 8 (GLOVE) ×2 IMPLANT
GLOVE PI ORTHO PRO STRL SZ8 (GLOVE) ×2 IMPLANT
GOWN STRL REUS W/ TWL LRG LVL3 (GOWN DISPOSABLE) ×2 IMPLANT
GOWN STRL REUS W/ TWL XL LVL3 (GOWN DISPOSABLE) ×2 IMPLANT
GOWN STRL REUS W/TWL LRG LVL3 (GOWN DISPOSABLE) ×1
GOWN STRL REUS W/TWL XL LVL3 (GOWN DISPOSABLE) ×1
HOOD PEEL AWAY T7 (MISCELLANEOUS) ×4 IMPLANT
K-WIRE 2.0 (WIRE) ×2
K-WIRE FXSTD 280X2XNS SS (WIRE) ×2
KIT BASIN OR (CUSTOM PROCEDURE TRAY) ×2 IMPLANT
KIT TURNOVER KIT A (KITS) IMPLANT
KWIRE FXSTD 280X2XNS SS (WIRE) IMPLANT
MANIFOLD NEPTUNE II (INSTRUMENTS) ×2 IMPLANT
NS IRRIG 1000ML POUR BTL (IV SOLUTION) ×2 IMPLANT
PACK TOTAL JOINT (CUSTOM PROCEDURE TRAY) ×2 IMPLANT
PAD ARMBOARD 7.5X6 YLW CONV (MISCELLANEOUS) ×4 IMPLANT
PLATE DIST FEM 12H (Plate) IMPLANT
SCREW 5.0 80MM (Screw) IMPLANT
SCREW CANN NCB PA 6.2X4.4/5 (Screw) IMPLANT
SCREW CORT NCB SELFTAP 5.0X50 (Screw) IMPLANT
SCREW NCB 4.0X40MM (Screw) IMPLANT
SCREW NCB 5.0X38 (Screw) IMPLANT
SCREW NCB 5.0X85MM (Screw) IMPLANT
STAPLER VISISTAT 35W (STAPLE) IMPLANT
SUT ETHILON 3 0 PS 1 (SUTURE) IMPLANT
SUT MNCRL AB 3-0 PS2 18 (SUTURE) IMPLANT
SUT STRATAFIX 0 PDS 27 VIOLET (SUTURE)
SUT STRATAFIX PDO 1 14 VIOLET (SUTURE)
SUT STRATFX PDO 1 14 VIOLET (SUTURE)
SUT VIC AB 0 CT1 36 (SUTURE) IMPLANT
SUT VIC AB 2-0 CT1 27 (SUTURE) ×2
SUT VIC AB 2-0 CT1 TAPERPNT 27 (SUTURE) IMPLANT
SUT VIC AB 2-0 CT2 27 (SUTURE) IMPLANT
SUTURE STRATFX 0 PDS 27 VIOLET (SUTURE) IMPLANT
SUTURE STRATFX PDO 1 14 VIOLET (SUTURE) IMPLANT
TOWEL OR 17X26 10 PK STRL BLUE (TOWEL DISPOSABLE) ×4 IMPLANT
TOWEL OR NON WOVEN STRL DISP B (DISPOSABLE) ×2 IMPLANT
TRAY FOLEY MTR SLVR 16FR STAT (SET/KITS/TRAYS/PACK) IMPLANT
WATER STERILE IRR 1000ML POUR (IV SOLUTION) ×4 IMPLANT

## 2022-08-22 NOTE — Anesthesia Procedure Notes (Signed)
Procedure Name: Intubation Date/Time: 08/22/2022 7:51 AM  Performed by: Cleda Daub, CRNAPre-anesthesia Checklist: Patient identified, Emergency Drugs available, Suction available and Patient being monitored Patient Re-evaluated:Patient Re-evaluated prior to induction Oxygen Delivery Method: Circle system utilized Preoxygenation: Pre-oxygenation with 100% oxygen Induction Type: IV induction Ventilation: Mask ventilation without difficulty Laryngoscope Size: Glidescope and 3 Grade View: Grade I Tube type: Oral Tube size: 7.0 mm Number of attempts: 1 Airway Equipment and Method: Stylet and Oral airway Placement Confirmation: ETT inserted through vocal cords under direct vision, positive ETCO2 and breath sounds checked- equal and bilateral Secured at: 20 cm Tube secured with: Tape Dental Injury: Teeth and Oropharynx as per pre-operative assessment

## 2022-08-22 NOTE — Interval H&P Note (Signed)
The patient has been re-examined, and the chart reviewed, and there have been no interval changes to the documented history and physical.    Plan for ORIF Left distal femur fracture today. Hgb improved to 7.4 after 2 units pRBCs overnight. Hemodynamically stable. Will have 2 additional units for periop available today.  The operative side was examined and the patient was confirmed to have sensation to DPN, SPN, TN intact, Motor EHL, ext, flex 5/5, and DP 2+, No significant edema.   The risks, benefits, and alternatives have been discussed at length with patient's niece, and the patient is willing to proceed.  Left leg marked. Consent has been signed.

## 2022-08-22 NOTE — Plan of Care (Signed)
Plan of care reviewed. 

## 2022-08-22 NOTE — Progress Notes (Signed)
Progress Note    Melinda Kerr   BZJ:[id number]  DOB: 05-30-1935  DOA: 08/20/2022     2 PCP: Maury Dus, MD  Initial CC: fall out of bed  Hospital Course: Melinda Kerr is an 87 yo female with PMH afib, dementia, obesity, chronic dCHF, CVA, DMII who presented after a fall out of the bed. She resides at [facility]. She was brought to the ER after her fall.  X-rays and CT head obtained on admission.  Imaging shows left comminuted displaced distal femoral fracture.  She was admitted for evaluation with orthopedic surgery.  She underwent ORIF on 08/22/2022.  Interval History:  Seen in room after returning from surgery today.  Lethargic and still waking up from anesthesia.  Still has ongoing pain in both lower extremities with ongoing edema.  Assessment and Plan: * Displaced comminuted fracture of shaft of left femur, initial encounter for closed fracture ([place]) - s/p fall from bed; likely some contribution from her dementia - xray shows left comminuted displaced distal femoral fracture - evaluated by ortho and underwent ORIF on 08/22/22 - continue pain control  - follow up PT/OT evals post op - patient from [facility]  A-fib [facility]) - patient does have prior history of afib; noted on EKG on 02/03/21; at the time she had repair of original left femoral fracture; likely had afib precipitated by the acute stress - she's not been on anticoagulation and I agree, risk outweighs benefit given age and bleed risk - rate control strategy will be pursued; still some RVR on admission - TSH normal, 1.007 - continue lopressor - repeat echo now: EF 60-65%, no RWMA, mild LVH, indeterminate diastology (was Gr II DD previously on May 2022 echo); overall her echo is stable and unchanged  Chronic diastolic CHF (congestive heart failure) ([place]) - no s/s exacerbation - noted to have Gr 2 DD on May 2022 echo; repeat echo unable to appreciate diastology; EF remains preserved at 60-65%  Acute urinary retention -  not unexpected given dementia and hip fx at this time - foley attempted by nursing multiple times without success - foley able to be placed in OR  - continue monitoring output  Microcytic anemia - Baseline hemoglobin around 9 to 10 g/dL.  Admitted with hemoglobin 8.6 g/dL with further downtrend to 5.8 g/dL - Etiology suspected due to bleeding from fall - Right hip actually more edematous and bruised. -Continue monitoring both thighs for any signs of possible compartment syndrome - s/p 2  units PRBC on 11/24 and again 2 units PRBC on 11/25 - required RIJ placement as PICC was unable to be placed - continue trending H/H  DNR (do not resuscitate) Verified DNR orders from SNF order sheet  Type 2 diabetes mellitus without complications ([place]) - O1B 7.5% - continue SSI and CBG monitoring  Obesity (BMI 51-02.5) - Complicates overall prognosis and care - Body mass index is 37.46 kg/m.  Bedbound Chronic.  History of CVA (cerebrovascular accident) Chronic. - s/p surgery, would be reasonable to continue on asa   Hypothyroidism Stable. Continue synthroid 125 mcg daily - TSH normal  OSA on CPAP Stable. Continue CPAP.   Old records reviewed in assessment of this patient  Antimicrobials:   DVT prophylaxis:  SCDs Start: 08/20/22 0731   Code Status:   Code Status: DNR  Mobility Assessment (last 72 hours)     Mobility Assessment     Row Name 08/22/22 1222 08/21/22 19:51:19 08/21/22 0739 08/20/22 0945 08/20/22 0650   Does patient  have an order for bedrest or is patient medically unstable No - Continue assessment Yes- Bedfast (Level 1) - Complete Yes- Bedfast (Level 1) - Complete Yes- Bedfast (Level 1) - Complete Yes- Bedfast (Level 1) - Complete   What is the highest level of mobility based on the progressive mobility assessment? Level 2 ([place]) - Balance while sitting on edge of bed and cannot stand -- -- -- --   Is the above level different from baseline mobility prior to  current illness? Yes - Recommend PT order -- -- -- --            Barriers to discharge:  Disposition Plan:  [facility] Status is: Inpt  Objective: Blood pressure 93/74, pulse 99, temperature 98.4 F (36.9 C), temperature source Oral, resp. rate 18, height '5\' 5"'$  (1.651 m), weight 102.1 kg, SpO2 (!) 83 %.  Examination:  Physical Exam Constitutional:      General: She is not in acute distress.    Appearance: Normal appearance.  HENT:     Head: Normocephalic and atraumatic.     Mouth/Throat:     Mouth: Mucous membranes are moist.  Eyes:     Extraocular Movements: Extraocular movements intact.  Cardiovascular:     Rate and Rhythm: Normal rate. Rhythm irregular.  Pulmonary:     Effort: Pulmonary effort is normal.     Breath sounds: Normal breath sounds.  Abdominal:     General: Bowel sounds are normal. There is no distension.     Palpations: Abdomen is soft.     Tenderness: There is no abdominal tenderness.  Musculoskeletal:     Cervical back: Normal range of motion.     Comments: Bilateral lower extremity significant edema; compartments soft but has TTP in both LE  Skin:    General: Skin is warm and dry.  Neurological:     Mental Status: She is alert. Mental status is at baseline.  Psychiatric:        Mood and Affect: Mood normal.      Consultants:  Orthopedic surgery  Procedures:  08/22/2022: Left periprosthetic distal femur fracture repair with ORIF  Data Reviewed: Results for orders placed or performed during the hospital encounter of 08/20/22 (from the past 24 hour(s))  Glucose, capillary     Status: Abnormal   Collection Time: 08/21/22  5:24 PM  Result Value Ref Range   Glucose-Capillary 237 (H) 70 - 99 mg/dL  Prepare RBC (crossmatch)     Status: None   Collection Time: 08/21/22  7:00 PM  Result Value Ref Range   Order Confirmation      ORDER PROCESSED BY BLOOD BANK Performed at [facility] [address], [place], [place] [address]    Glucose, capillary     Status: Abnormal   Collection Time: 08/21/22  9:45 PM  Result Value Ref Range   Glucose-Capillary 243 (H) 70 - 99 mg/dL  Basic metabolic panel     Status: Abnormal   Collection Time: 08/22/22  4:27 AM  Result Value Ref Range   Sodium 140 135 - 145 mmol/L   Potassium 3.5 3.5 - 5.1 mmol/L   Chloride 101 98 - 111 mmol/L   CO2 33 (H) 22 - 32 mmol/L   Glucose, Bld 236 (H) 70 - 99 mg/dL   BUN 39 (H) 8 - 23 mg/dL   Creatinine, Ser 0.74 0.44 - 1.00 mg/dL   Calcium 9.3 8.9 - 10.3 mg/dL   GFR, Estimated >60 >60 mL/min   Anion  gap 6 5 - 15  CBC with Differential/Platelet     Status: Abnormal   Collection Time: 08/22/22  4:27 AM  Result Value Ref Range   WBC 18.0 (H) 4.0 - 10.5 K/uL   RBC 2.94 (L) 3.87 - 5.11 MIL/uL   Hemoglobin 7.4 (L) 12.0 - 15.0 g/dL   HCT 23.5 (L) 36.0 - 46.0 %   MCV 79.9 (L) 80.0 - 100.0 fL   MCH 25.2 (L) 26.0 - 34.0 pg   MCHC 31.5 30.0 - 36.0 g/dL   RDW 17.3 (H) 11.5 - 15.5 %   Platelets 244 150 - 400 K/uL   nRBC 0.2 0.0 - 0.2 %   Neutrophils Relative % 79 %   Neutro Abs 14.2 (H) 1.7 - 7.7 K/uL   Lymphocytes Relative 8 %   Lymphs Abs 1.5 0.7 - 4.0 K/uL   Monocytes Relative 11 %   Monocytes Absolute 2.0 (H) 0.1 - 1.0 K/uL   Eosinophils Relative 0 %   Eosinophils Absolute 0.0 0.0 - 0.5 K/uL   Basophils Relative 0 %   Basophils Absolute 0.0 0.0 - 0.1 K/uL   Immature Granulocytes 2 %   Abs Immature Granulocytes 0.30 (H) 0.00 - 0.07 K/uL  Magnesium     Status: None   Collection Time: 08/22/22  4:27 AM  Result Value Ref Range   Magnesium 2.0 1.7 - 2.4 mg/dL  Prepare RBC (crossmatch)     Status: None   Collection Time: 08/22/22  7:14 AM  Result Value Ref Range   Order Confirmation      ORDER PROCESSED BY BLOOD BANK Performed at [facility], [place] [address], [place], [place] [address]   Glucose, capillary     Status: Abnormal   Collection Time: 08/22/22 10:45 AM  Result Value Ref Range   Glucose-Capillary 205 (H)  70 - 99 mg/dL  Glucose, capillary     Status: Abnormal   Collection Time: 08/22/22 11:57 AM  Result Value Ref Range   Glucose-Capillary 214 (H) 70 - 99 mg/dL    I have Reviewed nursing notes, Vitals, and Lab results since pt's last encounter. Pertinent lab results : see above I have ordered test including BMP, CBC, Mg I have reviewed the last note from staff over past 24 hours I have discussed pt's care plan and test results with nursing staff, case manager  Time spent: Greater than 50% of the 55 minute visit was spent in counseling/coordination of care for the patient as laid out in the A&P.    LOS: 2 days   Dwyane Dee, MD [facility] 08/22/2022, 3:12 PM

## 2022-08-22 NOTE — Anesthesia Postprocedure Evaluation (Signed)
Anesthesia Post Note  Patient: Emmeline Berton Mount  Procedure(s) Performed: OPEN REDUCTION INTERNAL FIXATION (ORIF) DISTAL FEMUR FRACTURE (Left: Leg Upper)     Patient location during evaluation: PACU Anesthesia Type: General Level of consciousness: awake and alert Pain management: pain level controlled Vital Signs Assessment: post-procedure vital signs reviewed and stable Respiratory status: spontaneous breathing, nonlabored ventilation, respiratory function stable and patient connected to nasal cannula oxygen Cardiovascular status: blood pressure returned to baseline and stable Postop Assessment: no apparent nausea or vomiting Anesthetic complications: no  No notable events documented.  Last Vitals:  Vitals:   08/22/22 1100 08/22/22 1115  BP: 119/78 (!) 112/55  Pulse: 80   Resp: (!) 22 (!) 21  Temp:  36.8 C  SpO2: 98% 96%    Last Pain:  Vitals:   08/22/22 0631  TempSrc: Oral  PainSc:                  Ashwin Tibbs L Micajah Dennin

## 2022-08-22 NOTE — Op Note (Signed)
08/20/2022 - 08/22/2022  10:16 AM  PATIENT:  Melinda Kerr    PRE-OPERATIVE DIAGNOSIS: Left periprosthetic distal femur fracture  POST-OPERATIVE DIAGNOSIS:  Same  PROCEDURE:  OPEN REDUCTION INTERNAL FIXATION (ORIF) DISTAL FEMUR FRACTURE  SURGEON:  Trevone Prestwood A Alzena Gerber, MD  PHYSICIAN ASSISTANT: Merlene Pulling, PA-C, present and scrubbed throughout the case, critical for completion in a timely fashion, and for retraction, instrumentation, and closure.  ANESTHESIA:   General  PREOPERATIVE INDICATIONS:  Melinda Kerr is a  87 y.o. female with a diagnosis of left distal femur fracture who failed conservative measures and elected for surgical management.    The risks benefits and alternatives were discussed with the patient preoperatively including but not limited to the risks of infection, bleeding, nerve injury, cardiopulmonary complications, the need for revision surgery, among others, and the patient was willing to proceed.  ESTIMATED BLOOD LOSS: 300cc  OPERATIVE IMPLANTS: 12 hole Biomet NCB distal femur locking plate.  Five 5.0 screws distally with locking caps, two 5.0 screws proximally with 1 locking And 2 4.0 locking screws proximally   OPERATIVE PROCEDURE:  The patient was identified in the preoperative holding area. Consent was confirmed with the patient and their family and all questions were answered. The operative extremity was marked after confirmation with the patient. The patient was then brought back to the operating room by our anesthesia colleagues. The patient was placed under general anesthesia and was carefully transferred over to a radiolucent flattop table. They were secured to the bed and all bony prominences were padded.  A bump was placed under the operative hip. The operative extremity was then prepped and draped in usual sterile fashion. A timeout was performed to verify the patient, the procedure and extremity. Preoperative antibiotics were dosed.   Fluoroscopy  was used to identify the fracture. A lateral approach was made to the distal femur. It was taken down through the skin and the IT band was incised in line with the incision. The distal portion of the vastus lateralis was reflected off the IT band and the distal articular block of the lateral femoral condyle was exposed.  An attempt was made to keep the fracture from being stripped or devitalized. I used fluoroscopy to identify the correct length plate to bridge the whole femoral shaft and proximal to the tip of the hip prothesis. I chose a 12-hole plate. The aiming arm was attached to the plate and slide submuscularly under the vastus to the proximal femur. The distal portion of the plate was placed flush against the lateral cortex of the distal articular block.  This was oriented on the distal fragment and held in place with 2 K wires.  Once we felt we had appropriate flexion extension and were at the distalmost aspect without hitting the knee prosthesis to cortical screws were placed distally to control the rotation position.  Next, a 3.25m drill bit was used to position the proximal portion of the plate. A perfect lateral was obtained to show appropriate positioning of the plate.  Felt that we were a little bit in valgus but otherwise appropriate position.  Given that the plate was far off the bone proximally we then filled the screws proximally to bring the plate down to the bone.  We had bicortical purchase with 3 screws.  The most proximal hole hit the prosthesis so used a unicortical screw here.   The distal segment was drilled and 5.033mcortical screws were placed in the distal fracture segment. A total of  5 screws were placed initially. Locking caps were placed in all the distal screws.  Proximally were able to get a locking cap in the most distal of the proximal holes, but despite multiple attempts cannot get locking caps of and any additional of the proximal holes.. Fluoroscopy was used to confirm  adequate reduction of the fracture and appropriate position of the plate and screws.   The incisions were irrigated with normal saline. A gram of vancomycin powder was placed in the incision around the plate. The IT band was closed with 0-vicryl. The skin was closed with 2-vicryl, 3-0 nylon. The incisions were dressed with adaptec, 4x4s and sterile cast padding and the leg was wrapped with a ACE wrap. The patient was then transferred to the regular floor bed and taken to the PACU in stable condition.  Post op recs: WB: NWB LLE Abx: ancef + vanc Imaging: PACU xrays Dressing: keep intact until follow up, change PRN if soiled or saturated. DVT prophylaxis: lovenox starting POD1 x4 weeks if hgb is stable Follow up: 2 weeks after surgery for a wound check with Dr. Zachery Dakins at [facility].  Address: [address], [place], [place] [address]  Office Phone: [phone]  Charlies Constable, MD Orthopaedic Surgery

## 2022-08-22 NOTE — Transfer of Care (Signed)
Immediate Anesthesia Transfer of Care Note  Patient: Melinda Kerr  Procedure(s) Performed: OPEN REDUCTION INTERNAL FIXATION (ORIF) DISTAL FEMUR FRACTURE (Left: Leg Upper)  Patient Location: PACU  Anesthesia Type:General  Level of Consciousness: Similar to preoperative status; awake; follows verbal commands. Airway & Oxygen Therapy: Patient Spontanous Breathing and Patient connected to face mask oxygen  Post-op Assessment: Report given to RN and Post -op Vital signs reviewed and stable  Post vital signs: Reviewed and stable  Last Vitals:  Vitals Value Taken Time  BP 141/84 08/22/22 1040  Temp    Pulse 39 08/22/22 1042  Resp 23 08/22/22 1043  SpO2 86 % 08/22/22 1042  Vitals shown include unvalidated device data.  Last Pain:  Vitals:   08/22/22 0631  TempSrc: Oral  PainSc:       Patients Stated Pain Goal: 3 (09/31/12 1624)  Complications: No notable events documented.

## 2022-08-22 NOTE — Evaluation (Signed)
Clinical/Bedside Swallow Evaluation Patient Details  Name: Melinda Kerr MRN: [id number] Date of Birth: 02/20/1935  Today's Date: 08/22/2022 Time: SLP Start Time (ACUTE ONLY): 3614 SLP Stop Time (ACUTE ONLY): 1733 SLP Time Calculation (min) (ACUTE ONLY): 26 min  Past Medical History:  Past Medical History:  Diagnosis Date   Acute CVA (cerebrovascular accident) ([place]) 05/15/2019   Arthritis    "shoulders, knees, lower back" (07/14/2017)   Atrial fibrillation ([place]) 02/03/2021   CHF (congestive heart failure) ([place])    "while in hospital w/hysterectomy in 2017"   Chronic lower back pain    Depression    Displaced comminuted fracture of shaft of right femur, initial encounter for closed fracture ([place]) 04/08/2019   Diverticulitis of large intestine with abscess 07/13/2017   DVT (deep venous thrombosis) (HCC)    Dyspnea    "cause I'm over weight"   Esophageal reflux    Femur fracture, left ([place]) 02/03/2021   Gout    Hiatal hernia    Hyperlipidemia    under control   Hypertension    Hypothyroidism    Morbid obesity with BMI of 40.0-44.9, adult ([place])    Nocturia    OSA on CPAP    "not wearing it when I sleep in my lift chair" (07/14/2017)   Osteoarthritis    Phlebitis    hx of   Pneumonia ?1989   Spondylosis    with scoliosis   TIA (transient ischemic attack) 05/14/2019   Type II diabetes mellitus (HCC)    borderline , diet controlled    Urinary frequency    Uterine cancer ([place])    S/P chemo, radiation, hysterectomy   Past Surgical History:  Past Surgical History:  Procedure Laterality Date   ANTERIOR APPROACH HEMI HIP ARTHROPLASTY Left 02/05/2021   Procedure: ANTERIOR APPROACH HEMI HIP ARTHROPLASTY;  Surgeon: Rod Can, MD;  Location: [facility];  Service: Orthopedics;  Laterality: Left;   APPENDECTOMY     APPLICATION OF WOUND VAC Left 03/08/2021   Procedure: APPLICATION OF WOUND VAC;  Surgeon: Rod Can, MD;  Location: [place];  Service: Orthopedics;  Laterality:  Left;   CATARACT EXTRACTION, BILATERAL Bilateral 2018   COLONOSCOPY     DILATATION & CURETTAGE/HYSTEROSCOPY WITH MYOSURE N/A 03/04/2016   Procedure: DILATATION & CURETTAGE/HYSTEROSCOPY ;  Surgeon: Azucena Fallen, MD;  Location: [facility];  Service: Gynecology;  Laterality: N/A;  polyp removal    ESOPHAGOGASTRODUODENOSCOPY (EGD) WITH PROPOFOL N/A 11/14/2018   Procedure: ESOPHAGOGASTRODUODENOSCOPY (EGD) WITH PROPOFOL;  Surgeon: Wonda Horner, MD;  Location: [facility];  Service: Endoscopy;  Laterality: N/A;   FEMUR IM NAIL Right 04/09/2019   Procedure: INTRAMEDULLARY (IM) NAIL INTERTROCH;  Surgeon: Renette Butters, MD;  Location: [place];  Service: Orthopedics;  Laterality: Right;   HARDWARE REMOVAL Right 05/09/2020   Procedure: HARDWARE REMOVAL RIGHT FEMUR;  Surgeon: Shona Needles, MD;  Location: [place];  Service: Orthopedics;  Laterality: Right;   HERNIA REPAIR     I & D EXTREMITY Left 03/08/2021   Procedure: LEFT HIP IRRIGATION AND DEBRIDEMENT;  Surgeon: Rod Can, MD;  Location: [place];  Service: Orthopedics;  Laterality: Left;   IR GENERIC HISTORICAL  05/15/2016   IR US GUIDE VASC ACCESS RIGHT 05/15/2016 Aletta Edouard, MD [facility]   IR GENERIC HISTORICAL  05/15/2016   IR FLUORO GUIDE CV LINE RIGHT 05/15/2016 Aletta Edouard, MD [facility]   IR REMOVAL TUN ACCESS W/ PORT W/O FL MOD SED  04/19/2018   LAPAROSCOPIC CHOLECYSTECTOMY  2004  LAPAROSCOPIC INCISIONAL / UMBILICAL / VENTRAL HERNIA REPAIR  2004   "w/gallbladder OR"   LYMPH NODE BIOPSY N/A 03/26/2016   Procedure: [place] LYMPH NODE BIOPSY;  Surgeon: Everitt Amber, MD;  [facility];  Service: Gynecology;  Laterality: N/A;   ORIF FEMUR FRACTURE Right 05/09/2020   Procedure: OPEN REDUCTION INTERNAL FIXATION (ORIF) DISTAL FEMUR FRACTURE;  Surgeon: Shona Needles, MD;  Location: [place];  Service: Orthopedics;  Laterality: Right;   REVERSE SHOULDER ARTHROPLASTY Right 10/26/2018   Procedure: REVERSE SHOULDER ARTHROPLASTY;  Surgeon:  Nicholes Stairs, MD;  Location: [place];  Service: Orthopedics;  Laterality: Right;   ROBOTIC ASSISTED TOTAL HYSTERECTOMY WITH BILATERAL SALPINGO OOPHERECTOMY N/A 03/26/2016   Procedure: XI ROBOTIC ASSISTED TOTAL Laproscopic HYSTERECTOMY WITH BILATERAL SALPINGO OOPHORECTOMY;  Surgeon: Everitt Amber, MD;  [facility];  Service: Gynecology;  Laterality: N/A;   TOTAL KNEE ARTHROPLASTY Left 10/09/2013   Procedure: LEFT TOTAL KNEE ARTHROPLASTY;  Surgeon: [name], MD;  [facility];  Service: Orthopedics;  Laterality: Left;   TOTAL KNEE ARTHROPLASTY Right 04/09/2014   Procedure: RIGHT TOTAL KNEE ARTHROPLASTY;  Surgeon: [name], MD;  [facility];  Service: Orthopedics;  Laterality: Right;   TUBAL LIGATION  1982   VARICOSE VEIN SURGERY Bilateral 1968   WISDOM TOOTH EXTRACTION  1963   HPI:  87 y.o. female admits related fall at SNF. PMH includes: heart failure, CVA, T2DM, bed bound. Pt is currently receiving medical management for displaced comminuted fracture of shaft of left femur.  She has history of hypertension, diabetes, hyperlipidemia, gout, uterine cancer, heart failure and is brought in by ambulance from a skilled nursing facility after patient fell out of bed.  Fall was unwitnessed.  08/21/2022 Large hiatal hernia., general anesthesia    Assessment / Plan / Recommendation  Clinical Impression  Pt asleep upon SLP arrival to her room, she had surgery earlier today under general anesthesia.  Awakes adequately to accept intake and no focal CN deficits present.  Was not oriented to place or situation despite frequent education.  Melinda Kerr did need fed by this SLP due to gross weakness, AMS. She consumed a small amount of water and tea via tsp, cup and straw, pudding and graham crackers.   Swallow was timely overall - minimal delay with solids due to mentation.  No retention orally or indication of pharyngeal retention.  Suboptimal position present due to pt's pain with HOB  elevation - thus SLP placed 2nd pillow behing her head to aid in slight chin tuck posture for improved airway protection. Recommend continue regular/thin diet with precautions and give medicine with applesauce - whole.  Will follow up x1 to assure pt tolerating po given RN on Friday indicating pt was having some difficulty with coughing on liquids.  Pt does have a large hiatal hernia per chart review.  SLP posted swallow precaution sign with recommendations and spoke to RN, Tu. Thank you. Marland Kitchen SLP Visit Diagnosis: Dysphagia, unspecified (R13.10)    Aspiration Risk  Mild aspiration risk    Diet Recommendation Regular;Thin liquid   Liquid Administration via: Cup;Straw Medication Administration: Whole meds with puree Supervision: Staff to assist with self feeding;Full supervision/cueing for compensatory strategies Compensations: Slow rate;Small sips/bites Postural Changes: Seated upright at 90 degrees;Remain upright for at least 30 minutes after po intake    Other  Recommendations Oral Care Recommendations: Oral care BID    Recommendations for follow up therapy are one component of a multi-disciplinary discharge planning process, led by  the attending physician.  Recommendations may be updated based on patient status, additional functional criteria and insurance authorization.  Follow up Recommendations Follow physician's recommendations for discharge plan and follow up therapies      Assistance Recommended at Discharge    Functional Status Assessment Patient has had a recent decline in their functional status and demonstrates the ability to make significant improvements in function in a reasonable and predictable amount of time.  Frequency and Duration min 1 x/week  1 week       Prognosis Prognosis for Safe Diet Advancement: Good      Swallow Study   General Date of Onset: 08/22/22 HPI: 87 y.o. female admits related fall at [facility]. PMH includes: heart failure, CVA, T2DM, bed bound. Pt is  currently receiving medical management for displaced comminuted fracture of shaft of left femur.  She has history of hypertension, diabetes, hyperlipidemia, gout, uterine cancer, heart failure and is brought in by ambulance from a skilled nursing facility after patient fell out of bed.  Fall was unwitnessed.  08/21/2022 Large hiatal hernia., general anesthesia Type of Study: Bedside Swallow Evaluation Diet Prior to this Study: Regular;Thin liquids Temperature Spikes Noted: No Respiratory Status: Nasal cannula History of Recent Intubation: No Behavior/Cognition: Lethargic/Drowsy Oral Cavity Assessment: Within Functional Limits Oral Care Completed by SLP: Yes Oral Cavity - Dentition: Adequate natural dentition Vision: Impaired for self-feeding Self-Feeding Abilities: Needs assist Patient Positioning: Partially reclined;Postural control adequate for testing (partially upright due to pt's discomfort from hip surgery earlier today) Baseline Vocal Quality: Normal Volitional Cough: Cognitively unable to elicit Volitional Swallow: Unable to elicit    Oral/Motor/Sensory Function Overall Oral Motor/Sensory Function: Within functional limits   Ice Chips Ice chips: Not tested   Thin Liquid Thin Liquid: Within functional limits Presentation: Cup;Straw    Nectar Thick Nectar Thick Liquid: Not tested   Honey Thick Honey Thick Liquid: Not tested   Puree Puree: Within functional limits Presentation: Spoon   Solid     Solid: Impaired Oral Phase Impairments: Impaired mastication Other Comments: minimally slow mastication clinically judged - but no oral retention nor indication of pharyngeal retention present      [name] 08/22/2022,7:19 PM  Kathleen Lime, MS [place] Office [phone] Pager [phone]

## 2022-08-22 NOTE — Evaluation (Signed)
SLP Cancellation Note  Patient Details Name: Melinda Kerr MRN: [id number] DOB: 1934/10/10   Cancelled treatment:       Reason Eval/Treat Not Completed: Other (comment) (pt bed location states "none", planned for surgery today per prior notes, will continue efforts)  Kathleen Lime, MS [facility] SLP Acute Rehab Services Office [phone] Pager [phone]  [name] 08/22/2022, 7:40 AM

## 2022-08-23 DIAGNOSIS — S72352A Displaced comminuted fracture of shaft of left femur, initial encounter for closed fracture: Secondary | ICD-10-CM | POA: Diagnosis not present

## 2022-08-23 DIAGNOSIS — D509 Iron deficiency anemia, unspecified: Secondary | ICD-10-CM | POA: Diagnosis not present

## 2022-08-23 DIAGNOSIS — I4891 Unspecified atrial fibrillation: Secondary | ICD-10-CM | POA: Diagnosis not present

## 2022-08-23 DIAGNOSIS — R338 Other retention of urine: Secondary | ICD-10-CM | POA: Diagnosis not present

## 2022-08-23 LAB — CBC WITH DIFFERENTIAL/PLATELET
Abs Immature Granulocytes: 0.33 10*3/uL — ABNORMAL HIGH (ref 0.00–0.07)
Basophils Absolute: 0.1 10*3/uL (ref 0.0–0.1)
Basophils Relative: 0 %
Eosinophils Absolute: 0 10*3/uL (ref 0.0–0.5)
Eosinophils Relative: 0 %
HCT: 26.7 % — ABNORMAL LOW (ref 36.0–46.0)
Hemoglobin: 8.5 g/dL — ABNORMAL LOW (ref 12.0–15.0)
Immature Granulocytes: 2 %
Lymphocytes Relative: 10 %
Lymphs Abs: 1.7 10*3/uL (ref 0.7–4.0)
MCH: 26.6 pg (ref 26.0–34.0)
MCHC: 31.8 g/dL (ref 30.0–36.0)
MCV: 83.7 fL (ref 80.0–100.0)
Monocytes Absolute: 1.8 10*3/uL — ABNORMAL HIGH (ref 0.1–1.0)
Monocytes Relative: 10 %
Neutro Abs: 13.5 10*3/uL — ABNORMAL HIGH (ref 1.7–7.7)
Neutrophils Relative %: 78 %
Platelets: 188 10*3/uL (ref 150–400)
RBC: 3.19 MIL/uL — ABNORMAL LOW (ref 3.87–5.11)
RDW: 17.4 % — ABNORMAL HIGH (ref 11.5–15.5)
WBC: 17.4 10*3/uL — ABNORMAL HIGH (ref 4.0–10.5)
nRBC: 0.9 % — ABNORMAL HIGH (ref 0.0–0.2)

## 2022-08-23 LAB — BASIC METABOLIC PANEL
Anion gap: 7 (ref 5–15)
BUN: 37 mg/dL — ABNORMAL HIGH (ref 8–23)
CO2: 31 mmol/L (ref 22–32)
Calcium: 8.8 mg/dL — ABNORMAL LOW (ref 8.9–10.3)
Chloride: 100 mmol/L (ref 98–111)
Creatinine, Ser: 0.73 mg/dL (ref 0.44–1.00)
GFR, Estimated: >60 mL/min (ref >60)
Glucose, Bld: 229 mg/dL — ABNORMAL HIGH (ref 70–99)
Potassium: 3.6 mmol/L (ref 3.5–5.1)
Sodium: 138 mmol/L (ref 135–145)

## 2022-08-23 LAB — HEMOGLOBIN AND HEMATOCRIT, BLOOD
HCT: 27.8 % — ABNORMAL LOW (ref 36.0–46.0)
Hemoglobin: 8.4 g/dL — ABNORMAL LOW (ref 12.0–15.0)

## 2022-08-23 LAB — GLUCOSE, CAPILLARY
Glucose-Capillary: 177 mg/dL — ABNORMAL HIGH (ref 70–99)
Glucose-Capillary: 255 mg/dL — ABNORMAL HIGH (ref 70–99)
Glucose-Capillary: 229 mg/dL — ABNORMAL HIGH (ref 70–99)
Glucose-Capillary: 208 mg/dL — ABNORMAL HIGH (ref 70–99)

## 2022-08-23 LAB — MAGNESIUM: Magnesium: 2 mg/dL (ref 1.7–2.4)

## 2022-08-23 MED ORDER — ENOXAPARIN SODIUM 40 MG/0.4ML IJ SOSY
40.0000 mg | PREFILLED_SYRINGE | Freq: Every day | INTRAMUSCULAR | Status: DC
  Administered 2022-08-23 – 2022-08-31 (×9): 40 mg via SUBCUTANEOUS
  Filled 2022-08-23 (×9): qty 0.4

## 2022-08-23 NOTE — Progress Notes (Signed)
Subjective: 1 Day Post-Op s/p Procedure(s): OPEN REDUCTION INTERNAL FIXATION (ORIF) DISTAL FEMUR FRACTURE   Patient is alert, pleasantly confused. Reports no pain. Denies chest pain, SOB, Calf pain. No nausea/vomiting. No complaints.    Objective:  PE: VITALS:   Vitals:   08/23/22 0127 08/23/22 0530 08/23/22 0807 08/23/22 0927  BP: (!) 150/61 94/65 103/80 (!) 119/48  Pulse: 88 89 75 97  Resp: '18 20  18  '$ Temp: 99.2 F (37.3 C) 98.6 F (37 C)  97.7 F (36.5 C)  TempSrc: Oral Oral  Oral  SpO2: 95% 100%  95%  Weight:      Height:       General: laying in bed in no acute distress GI: soft, nontender abdomen MSK: Dorsiflexion and plantarflexion intact at left ankle though ROM is limited, able to wiggle all toes. Feet warm and well perfused. 1+ edema at feet. Dressing intact with mild bloody drainage at most proximal incision site, will leave intact. Pateint endorses sensation to foot.  LABS  Results for orders placed or performed during the hospital encounter of 08/20/22 (from the past 24 hour(s))  Glucose, capillary     Status: Abnormal   Collection Time: 08/22/22 10:45 AM  Result Value Ref Range   Glucose-Capillary 205 (H) 70 - 99 mg/dL  Glucose, capillary     Status: Abnormal   Collection Time: 08/22/22 11:57 AM  Result Value Ref Range   Glucose-Capillary 214 (H) 70 - 99 mg/dL  CBC     Status: Abnormal   Collection Time: 08/22/22  3:00 PM  Result Value Ref Range   WBC 31.1 (H) 4.0 - 10.5 K/uL   RBC 3.53 (L) 3.87 - 5.11 MIL/uL   Hemoglobin 9.4 (L) 12.0 - 15.0 g/dL   HCT 29.3 (L) 36.0 - 46.0 %   MCV 83.0 80.0 - 100.0 fL   MCH 26.6 26.0 - 34.0 pg   MCHC 32.1 30.0 - 36.0 g/dL   RDW 17.1 (H) 11.5 - 15.5 %   Platelets 201 150 - 400 K/uL   nRBC 0.3 (H) 0.0 - 0.2 %  Glucose, capillary     Status: Abnormal   Collection Time: 08/22/22  6:04 PM  Result Value Ref Range   Glucose-Capillary 223 (H) 70 - 99 mg/dL  Glucose, capillary     Status: Abnormal   Collection  Time: 08/22/22  9:59 PM  Result Value Ref Range   Glucose-Capillary 219 (H) 70 - 99 mg/dL  Basic metabolic panel     Status: Abnormal   Collection Time: 08/23/22  3:04 AM  Result Value Ref Range   Sodium 138 135 - 145 mmol/L   Potassium 3.6 3.5 - 5.1 mmol/L   Chloride 100 98 - 111 mmol/L   CO2 31 22 - 32 mmol/L   Glucose, Bld 229 (H) 70 - 99 mg/dL   BUN 37 (H) 8 - 23 mg/dL   Creatinine, Ser 0.73 0.44 - 1.00 mg/dL   Calcium 8.8 (L) 8.9 - 10.3 mg/dL   GFR, Estimated >60 >60 mL/min   Anion gap 7 5 - 15  CBC with Differential/Platelet     Status: Abnormal   Collection Time: 08/23/22  3:04 AM  Result Value Ref Range   WBC 17.4 (H) 4.0 - 10.5 K/uL   RBC 3.19 (L) 3.87 - 5.11 MIL/uL   Hemoglobin 8.5 (L) 12.0 - 15.0 g/dL   HCT 26.7 (L) 36.0 - 46.0 %   MCV 83.7 80.0 - 100.0 fL  MCH 26.6 26.0 - 34.0 pg   MCHC 31.8 30.0 - 36.0 g/dL   RDW 17.4 (H) 11.5 - 15.5 %   Platelets 188 150 - 400 K/uL   nRBC 0.9 (H) 0.0 - 0.2 %   Neutrophils Relative % 78 %   Neutro Abs 13.5 (H) 1.7 - 7.7 K/uL   Lymphocytes Relative 10 %   Lymphs Abs 1.7 0.7 - 4.0 K/uL   Monocytes Relative 10 %   Monocytes Absolute 1.8 (H) 0.1 - 1.0 K/uL   Eosinophils Relative 0 %   Eosinophils Absolute 0.0 0.0 - 0.5 K/uL   Basophils Relative 0 %   Basophils Absolute 0.1 0.0 - 0.1 K/uL   Immature Granulocytes 2 %   Abs Immature Granulocytes 0.33 (H) 0.00 - 0.07 K/uL  Magnesium     Status: None   Collection Time: 08/23/22  3:04 AM  Result Value Ref Range   Magnesium 2.0 1.7 - 2.4 mg/dL  Glucose, capillary     Status: Abnormal   Collection Time: 08/23/22  7:16 AM  Result Value Ref Range   Glucose-Capillary 177 (H) 70 - 99 mg/dL    DG FEMUR PORT MIN 2 VIEWS LEFT  Result Date: 08/22/2022 CLINICAL DATA:  Status post internal fixation of left distal femur fracture. EXAM: LEFT FEMUR PORTABLE 2 VIEWS COMPARISON:  Fluoroscopic images of same day. FINDINGS: Status post left hemiarthroplasty and left total knee arthroplasty.  Status post surgical internal fixation of comminuted and displaced distal left femoral periprosthetic fracture. IMPRESSION: Status post surgical internal fixation of comminuted and displaced distal left femoral periprosthetic fracture. Electronically Signed   By: Marijo Conception M.D.   On: 08/22/2022 13:56   DG FEMUR MIN 2 VIEWS LEFT  Result Date: 08/22/2022 CLINICAL DATA:  Left femur fracture. EXAM: LEFT FEMUR 2 VIEWS; DG C-ARM 1-60 MIN-NO REPORT Radiation exposure index: 29.447 mGy. COMPARISON:  August 20, 2022. FINDINGS: Twelve intraoperative fluoroscopic images were obtained of the left femur. These images demonstrate surgical internal fixation of distal left femoral fracture. IMPRESSION: Fluoroscopic guidance provided during surgical internal fixation of distal left femoral fracture. Electronically Signed   By: Marijo Conception M.D.   On: 08/22/2022 11:28   DG C-Arm 1-60 Min-No Report  Result Date: 08/22/2022 Fluoroscopy was utilized by the requesting physician.  No radiographic interpretation.   DG C-Arm 1-60 Min-No Report  Result Date: 08/22/2022 Fluoroscopy was utilized by the requesting physician.  No radiographic interpretation.   DG Chest Port 1 View  Result Date: 08/22/2022 CLINICAL DATA:  Check central line placement EXAM: PORTABLE CHEST 1 VIEW COMPARISON:  Film from the previous day. FINDINGS: Cardiac shadow is enlarged. Aortic calcifications are again noted. Right jugular central line is again seen with the catheter tip in the proximal superior vena cava. A loop has formed in the interval from the prior exam. This likely lies beneath the skin surface. Lungs are clear. No pneumothorax is noted. Postsurgical changes in the right shoulder are seen. IMPRESSION: Interval formation of a loop in the midportion of the right jugular central catheter. No pneumothorax is noted. Electronically Signed   By: Inez Catalina M.D.   On: 08/22/2022 03:38   DG CHEST PORT 1 VIEW  Result Date:  08/21/2022 CLINICAL DATA:  [id number] Encounter for central line placement [id number] EXAM: PORTABLE CHEST 1 VIEW COMPARISON:  Chest x-ray 08/20/2022, CT chest 05/16/2019 FINDINGS: Internal right internal jugular central venous catheter placement with tip overlying the expected region of the superior cavoatrial junction. Enlarged  cardiac silhouette. Otherwise the heart and mediastinal contours are within normal limits. Atherosclerotic plaque. Low lung volumes. No focal consolidation. No pulmonary edema. No pleural effusion. No pneumothorax. No acute osseous abnormality. Partially visualized right shoulder arthroplasty. IMPRESSION: 1. Right internal jugular central venous catheter in appropriate position. 2. Enlarged cardiac silhouette with underlying pericardial effusion not excluded. 3. Low lung volumes. 4.  Aortic Atherosclerosis (ICD10-I70.0). Electronically Signed   By: Iven Finn M.D.   On: 08/21/2022 19:00    Assessment/Plan: Principal Problem:   Displaced comminuted fracture of shaft of left femur, initial encounter for closed fracture (HCC) Active Problems:   OSA on CPAP   Hypothyroidism   Microcytic anemia   History of CVA (cerebrovascular accident)   Chronic diastolic CHF (congestive heart failure) (HCC)   Bedbound   Obesity (BMI 30-39.9)   A-fib ([place])   Type 2 diabetes mellitus without complications ([place])   DNR (do not resuscitate)   Acute urinary retention   Fall at nursing home   Closed fracture of distal end of left femur, initial encounter ([place])   Normocytic anemia   1 Day Post-Op s/p Procedure(s): OPEN REDUCTION INTERNAL FIXATION (ORIF) DISTAL FEMUR FRACTURE Acute blood loss anemia - Hbg dropped 8.5 from 9.4 yesterday afternoon  WB: NWB LLE Abx: ancef + vanc Imaging: PACU xrays stable, s/p ORIF Dressing: keep intact until follow up, change PRN if soiled or saturated. DVT prophylaxis: lovenox starting POD1 x4 weeks Follow up: 2 weeks after surgery for a wound check with  Dr. Zachery Dakins at   Contact information:   Merlene Pulling, PA-C Weekdays 8-5  After hours and holidays please check [email or web address] for group call information for [facility]  Ventura Bruns 08/23/2022, 9:50 AM

## 2022-08-23 NOTE — Progress Notes (Signed)
Progress Note    Melinda Kerr   WUJ:[id number]  DOB: Dec 14, 1934  DOA: 08/20/2022     3 PCP: Maury Dus, MD  Initial CC: fall out of bed  Hospital Course: Melinda Kerr is an 87 yo female with PMH afib, dementia, obesity, chronic dCHF, CVA, DMII who presented after a fall out of the bed. She resides at [facility]. She was brought to the ER after her fall.  X-rays and CT head obtained on admission.  Imaging shows left comminuted displaced distal femoral fracture.  She was admitted for evaluation with orthopedic surgery.  She underwent ORIF on 08/22/2022.  Interval History:  No events overnight.  Pain improved per patient however was still very tender on palpation of her legs.  Bruising in right leg worsened today.  Assessment and Plan: * Displaced comminuted fracture of shaft of left femur, initial encounter for closed fracture ([place]) - s/p fall from bed; likely some contribution from her dementia - xray shows left comminuted displaced distal femoral fracture - evaluated by ortho and underwent ORIF on 08/22/22 - continue pain control  - follow up PT/OT evals post op - patient from [facility]  A-fib [facility]) - patient does have prior history of afib; noted on EKG on 02/03/21; at the time she had repair of original left femoral fracture; likely had afib precipitated by the acute stress - she's not been on anticoagulation and I agree, risk outweighs benefit given age and bleed risk - rate control strategy will be pursued; still some RVR on admission - TSH normal, 1.007 - continue lopressor - repeat echo now: EF 60-65%, no RWMA, mild LVH, indeterminate diastology (was Gr II DD previously on May 2022 echo); overall her echo is stable and unchanged  Chronic diastolic CHF (congestive heart failure) ([place]) - no s/s exacerbation - noted to have Gr 2 DD on May 2022 echo; repeat echo unable to appreciate diastology; EF remains preserved at 60-65%  Acute urinary retention - not unexpected given  dementia and hip fx at this time - foley attempted by nursing multiple times without success - foley able to be placed in OR  - continue monitoring output  Microcytic anemia - Baseline hemoglobin around 9 to 10 g/dL.  Admitted with hemoglobin 8.6 g/dL with further downtrend to 5.8 g/dL - Etiology suspected due to bleeding from fall - Right hip actually more edematous and bruised. -Continue monitoring both thighs for any signs of possible compartment syndrome - s/p 2  units PRBC on 11/24 and again 2 units PRBC on 11/25 - required RIJ placement as PICC was unable to be placed - continue trending H/H  DNR (do not resuscitate) Verified DNR orders from SNF order sheet  Type 2 diabetes mellitus without complications ([place]) - N5A 7.5% - continue SSI and CBG monitoring  Obesity (BMI 21-30.8) - Complicates overall prognosis and care - Body mass index is 37.46 kg/m.  Bedbound Chronic.  History of CVA (cerebrovascular accident) Chronic. - s/p surgery, would be reasonable to continue on asa   Hypothyroidism Stable. Continue synthroid 125 mcg daily - TSH normal  OSA on CPAP Stable. Continue CPAP.   Old records reviewed in assessment of this patient  Antimicrobials:   DVT prophylaxis:  enoxaparin (LOVENOX) injection 40 mg Start: 08/23/22 1200 SCDs Start: 08/20/22 0731   Code Status:   Code Status: DNR  Mobility Assessment (last 72 hours)     Mobility Assessment     Row Name 08/23/22 0707 08/22/22 2145 08/22/22 1222 08/21/22 19:51:19 08/21/22  0739   Does patient have an order for bedrest or is patient medically unstable Yes- Bedfast (Level 1) - Complete Yes- Bedfast (Level 1) - Complete No - Continue assessment Yes- Bedfast (Level 1) - Complete Yes- Bedfast (Level 1) - Complete   What is the highest level of mobility based on the progressive mobility assessment? Level 1 (Bedfast) - Unable to balance while sitting on edge of bed Level 1 (Bedfast) - Unable to balance while  sitting on edge of bed Level 1 (Bedfast) - Unable to balance while sitting on edge of bed -- --   Is the above level different from baseline mobility prior to current illness? Yes - Recommend PT order Yes - Recommend PT order Yes - Recommend PT order -- --            Barriers to discharge:  Disposition Plan:  [facility] Status is: Inpt  Objective: Blood pressure (!) 119/48, pulse 97, temperature 97.7 F (36.5 C), temperature source Oral, resp. rate 18, height '5\' 5"'$  (1.651 m), weight 102.1 kg, SpO2 95 %.  Examination:  Physical Exam Constitutional:      General: She is not in acute distress.    Appearance: Normal appearance.  HENT:     Head: Normocephalic and atraumatic.     Mouth/Throat:     Mouth: Mucous membranes are moist.  Eyes:     Extraocular Movements: Extraocular movements intact.  Cardiovascular:     Rate and Rhythm: Normal rate. Rhythm irregular.  Pulmonary:     Effort: Pulmonary effort is normal.     Breath sounds: Normal breath sounds.  Abdominal:     General: Bowel sounds are normal. There is no distension.     Palpations: Abdomen is soft.     Tenderness: There is no abdominal tenderness.  Musculoskeletal:     Cervical back: Normal range of motion.     Comments: Bilateral lower extremity significant edema; compartments soft but has TTP in both LE  Skin:    General: Skin is warm and dry.  Neurological:     Mental Status: She is alert. Mental status is at baseline.  Psychiatric:        Mood and Affect: Mood normal.     Consultants:  Orthopedic surgery  Procedures:  08/22/2022: Left periprosthetic distal femur fracture repair with ORIF  Data Reviewed: Results for orders placed or performed during the hospital encounter of 08/20/22 (from the past 24 hour(s))  Glucose, capillary     Status: Abnormal   Collection Time: 08/22/22  6:04 PM  Result Value Ref Range   Glucose-Capillary 223 (H) 70 - 99 mg/dL  Glucose, capillary     Status: Abnormal   Collection  Time: 08/22/22  9:59 PM  Result Value Ref Range   Glucose-Capillary 219 (H) 70 - 99 mg/dL  Basic metabolic panel     Status: Abnormal   Collection Time: 08/23/22  3:04 AM  Result Value Ref Range   Sodium 138 135 - 145 mmol/L   Potassium 3.6 3.5 - 5.1 mmol/L   Chloride 100 98 - 111 mmol/L   CO2 31 22 - 32 mmol/L   Glucose, Bld 229 (H) 70 - 99 mg/dL   BUN 37 (H) 8 - 23 mg/dL   Creatinine, Ser 0.73 0.44 - 1.00 mg/dL   Calcium 8.8 (L) 8.9 - 10.3 mg/dL   GFR, Estimated >60 >60 mL/min   Anion gap 7 5 - 15  CBC with Differential/Platelet     Status: Abnormal  Collection Time: 08/23/22  3:04 AM  Result Value Ref Range   WBC 17.4 (H) 4.0 - 10.5 K/uL   RBC 3.19 (L) 3.87 - 5.11 MIL/uL   Hemoglobin 8.5 (L) 12.0 - 15.0 g/dL   HCT 26.7 (L) 36.0 - 46.0 %   MCV 83.7 80.0 - 100.0 fL   MCH 26.6 26.0 - 34.0 pg   MCHC 31.8 30.0 - 36.0 g/dL   RDW 17.4 (H) 11.5 - 15.5 %   Platelets 188 150 - 400 K/uL   nRBC 0.9 (H) 0.0 - 0.2 %   Neutrophils Relative % 78 %   Neutro Abs 13.5 (H) 1.7 - 7.7 K/uL   Lymphocytes Relative 10 %   Lymphs Abs 1.7 0.7 - 4.0 K/uL   Monocytes Relative 10 %   Monocytes Absolute 1.8 (H) 0.1 - 1.0 K/uL   Eosinophils Relative 0 %   Eosinophils Absolute 0.0 0.0 - 0.5 K/uL   Basophils Relative 0 %   Basophils Absolute 0.1 0.0 - 0.1 K/uL   Immature Granulocytes 2 %   Abs Immature Granulocytes 0.33 (H) 0.00 - 0.07 K/uL  Magnesium     Status: None   Collection Time: 08/23/22  3:04 AM  Result Value Ref Range   Magnesium 2.0 1.7 - 2.4 mg/dL  Glucose, capillary     Status: Abnormal   Collection Time: 08/23/22  7:16 AM  Result Value Ref Range   Glucose-Capillary 177 (H) 70 - 99 mg/dL  Glucose, capillary     Status: Abnormal   Collection Time: 08/23/22 11:59 AM  Result Value Ref Range   Glucose-Capillary 255 (H) 70 - 99 mg/dL    I have Reviewed nursing notes, Vitals, and Lab results since pt's last encounter. Pertinent lab results : see above I have ordered test including  BMP, CBC, Mg I have reviewed the last note from staff over past 24 hours I have discussed pt's care plan and test results with nursing staff, case manager  Time spent: Greater than 50% of the 55 minute visit was spent in counseling/coordination of care for the patient as laid out in the A&P.    LOS: 3 days   Dwyane Dee, MD [facility] 08/23/2022, 4:22 PM

## 2022-08-23 NOTE — Plan of Care (Signed)
Plan of care reviewed. 

## 2022-08-23 NOTE — Plan of Care (Signed)
  Problem: Pain Managment: Goal: General experience of comfort will improve Outcome: Progressing   Problem: Safety: Goal: Ability to remain free from injury will improve Outcome: Progressing   

## 2022-08-24 ENCOUNTER — Encounter (HOSPITAL_COMMUNITY): Payer: Self-pay | Admitting: Orthopedic Surgery

## 2022-08-24 DIAGNOSIS — S72352A Displaced comminuted fracture of shaft of left femur, initial encounter for closed fracture: Secondary | ICD-10-CM | POA: Diagnosis not present

## 2022-08-24 DIAGNOSIS — R338 Other retention of urine: Secondary | ICD-10-CM | POA: Diagnosis not present

## 2022-08-24 DIAGNOSIS — I4891 Unspecified atrial fibrillation: Secondary | ICD-10-CM | POA: Diagnosis not present

## 2022-08-24 LAB — BPAM RBC
Blood Product Expiration Date: 202312142359
Blood Product Expiration Date: 202312142359
Blood Product Expiration Date: 202312142359
Blood Product Expiration Date: 202312142359
Blood Product Expiration Date: 202312152359
ISSUE DATE / TIME: 202311240629
ISSUE DATE / TIME: 202311241959
ISSUE DATE / TIME: 202311242338
ISSUE DATE / TIME: 202311250716
ISSUE DATE / TIME: 202311250716
Unit Type and Rh: 600
Unit Type and Rh: 600
Unit Type and Rh: 600
Unit Type and Rh: 600
Unit Type and Rh: 600

## 2022-08-24 LAB — BASIC METABOLIC PANEL
Anion gap: 6 (ref 5–15)
BUN: 27 mg/dL — ABNORMAL HIGH (ref 8–23)
CO2: 29 mmol/L (ref 22–32)
Calcium: 8.7 mg/dL — ABNORMAL LOW (ref 8.9–10.3)
Chloride: 101 mmol/L (ref 98–111)
Creatinine, Ser: 0.55 mg/dL (ref 0.44–1.00)
GFR, Estimated: >60 mL/min (ref >60)
Glucose, Bld: 191 mg/dL — ABNORMAL HIGH (ref 70–99)
Potassium: 3.7 mmol/L (ref 3.5–5.1)
Sodium: 136 mmol/L (ref 135–145)

## 2022-08-24 LAB — TYPE AND SCREEN
ABO/RH(D): A NEG
Antibody Screen: NEGATIVE
Unit division: 0
Unit division: 0
Unit division: 0
Unit division: 0
Unit division: 0

## 2022-08-24 LAB — CBC WITH DIFFERENTIAL/PLATELET
Abs Immature Granulocytes: 0.39 10*3/uL — ABNORMAL HIGH (ref 0.00–0.07)
Basophils Absolute: 0.1 10*3/uL (ref 0.0–0.1)
Basophils Relative: 0 %
Eosinophils Absolute: 0 10*3/uL (ref 0.0–0.5)
Eosinophils Relative: 0 %
HCT: 27.6 % — ABNORMAL LOW (ref 36.0–46.0)
Hemoglobin: 8.4 g/dL — ABNORMAL LOW (ref 12.0–15.0)
Immature Granulocytes: 2 %
Lymphocytes Relative: 12 %
Lymphs Abs: 1.9 10*3/uL (ref 0.7–4.0)
MCH: 26.3 pg (ref 26.0–34.0)
MCHC: 30.4 g/dL (ref 30.0–36.0)
MCV: 86.3 fL (ref 80.0–100.0)
Monocytes Absolute: 1.8 10*3/uL — ABNORMAL HIGH (ref 0.1–1.0)
Monocytes Relative: 11 %
Neutro Abs: 12.3 10*3/uL — ABNORMAL HIGH (ref 1.7–7.7)
Neutrophils Relative %: 75 %
Platelets: 198 10*3/uL (ref 150–400)
RBC: 3.2 MIL/uL — ABNORMAL LOW (ref 3.87–5.11)
RDW: 18 % — ABNORMAL HIGH (ref 11.5–15.5)
WBC: 16.5 10*3/uL — ABNORMAL HIGH (ref 4.0–10.5)
nRBC: 0.6 % — ABNORMAL HIGH (ref 0.0–0.2)

## 2022-08-24 LAB — GLUCOSE, CAPILLARY
Glucose-Capillary: 180 mg/dL — ABNORMAL HIGH (ref 70–99)
Glucose-Capillary: 239 mg/dL — ABNORMAL HIGH (ref 70–99)
Glucose-Capillary: 274 mg/dL — ABNORMAL HIGH (ref 70–99)
Glucose-Capillary: 229 mg/dL — ABNORMAL HIGH (ref 70–99)

## 2022-08-24 LAB — MAGNESIUM: Magnesium: 1.9 mg/dL (ref 1.7–2.4)

## 2022-08-24 NOTE — Progress Notes (Signed)
Progress Note    SHONTERIA ABELN   FBP:[id number]  DOB: May 04, 1935  DOA: 08/20/2022     4 PCP: Maury Dus, MD  Initial CC: fall out of bed  Hospital Course: Ms. Braaten is an 87 yo female with PMH afib, dementia, obesity, chronic dCHF, CVA, DMII who presented after a fall out of the bed. She resides at [facility]. She was brought to the ER after her fall.  X-rays and CT head obtained on admission.  Imaging shows left comminuted displaced distal femoral fracture.  She was admitted for evaluation with orthopedic surgery.  She underwent ORIF on 08/22/2022.  Interval History:  No events overnight.  States her pain is improved and controlled however still has significant tenderness with movement of her legs.  Bruising in right leg continues to worsen some.  Assessment and Plan: * Displaced comminuted fracture of shaft of left femur, initial encounter for closed fracture ([place]) - s/p fall from bed; likely some contribution from her dementia - xray shows left comminuted displaced distal femoral fracture - evaluated by ortho and underwent ORIF on 08/22/22 - continue pain control  - follow up PT/OT evals post op - patient from [facility]  [facility]-fib [facility]) - patient does have prior history of afib; noted on EKG on 02/03/21; at the time she had repair of original left femoral fracture; likely had afib precipitated by the acute stress - she's not been on anticoagulation and I agree, risk outweighs benefit given age and bleed risk - rate control strategy will be pursued; still some RVR on admission - TSH normal, 1.007 - continue lopressor - repeat echo now: EF 60-65%, no RWMA, mild LVH, indeterminate diastology (was Gr II DD previously on May 2022 echo); overall her echo is stable and unchanged  Chronic diastolic CHF (congestive heart failure) ([place]) - no s/s exacerbation - noted to have Gr 2 DD on May 2022 echo; repeat echo unable to appreciate diastology; EF remains preserved at 60-65%  Acute urinary  retention - not unexpected given dementia and hip fx at this time - foley attempted by nursing multiple times without success - foley able to be placed in OR  - okay to d/c foley today and will do TOV; start bladder scans after foley removal  Microcytic anemia - Baseline hemoglobin around 9 to 10 g/dL.  Admitted with hemoglobin 8.6 g/dL with further downtrend to 5.8 g/dL - Etiology suspected due to bleeding from fall - Right hip actually more edematous and bruised. -Continue monitoring both thighs for any signs of possible compartment syndrome - s/p 2  units PRBC on 11/24 and again 2 units PRBC on 11/25 - required RIJ placement as PICC was unable to be placed - continue trending H/H  DNR (do not resuscitate) Verified DNR orders from SNF order sheet  Type 2 diabetes mellitus without complications ([place]) - O2U 7.5% - continue SSI and CBG monitoring  Obesity (BMI 23-53.6) - Complicates overall prognosis and care - Body mass index is 37.46 kg/m.  Bedbound Chronic.  History of CVA (cerebrovascular accident) Chronic. - s/p surgery, would be reasonable to continue on asa   Hypothyroidism Stable. Continue synthroid 125 mcg daily - TSH normal  OSA on CPAP Stable. Continue CPAP.   Old records reviewed in assessment of this patient  Antimicrobials:   DVT prophylaxis:  enoxaparin (LOVENOX) injection 40 mg Start: 08/23/22 1200 SCDs Start: 08/20/22 0731   Code Status:   Code Status: DNR  Mobility Assessment (last 72 hours)  Mobility Assessment     Row Name 08/24/22 0900 08/24/22 0735 08/23/22 1901 08/23/22 0707 08/22/22 2145   Does patient have an order for bedrest or is patient medically unstable Yes- Bedfast (Level 1) - Complete Yes- Bedfast (Level 1) - Complete Yes- Bedfast (Level 1) - Complete Yes- Bedfast (Level 1) - Complete Yes- Bedfast (Level 1) - Complete   What is the highest level of mobility based on the progressive mobility assessment? Level 1 (Bedfast) -  Unable to balance while sitting on edge of bed Level 1 (Bedfast) - Unable to balance while sitting on edge of bed Level 1 (Bedfast) - Unable to balance while sitting on edge of bed Level 1 (Bedfast) - Unable to balance while sitting on edge of bed Level 1 (Bedfast) - Unable to balance while sitting on edge of bed   Is the above level different from baseline mobility prior to current illness? No - Consider discontinuing PT/OT  baseline prior to illness No - Consider discontinuing PT/OT Yes - Recommend PT order Yes - Recommend PT order Yes - Recommend PT order    [place] Name 08/22/22 1222 08/21/22 19:51:19         Does patient have an order for bedrest or is patient medically unstable No - Continue assessment Yes- Bedfast (Level 1) - Complete      What is the highest level of mobility based on the progressive mobility assessment? Level 1 (Bedfast) - Unable to balance while sitting on edge of bed --      Is the above level different from baseline mobility prior to current illness? Yes - Recommend PT order --               Barriers to discharge:  Disposition Plan:  [facility] Status is: Inpt  Objective: Blood pressure 134/69, pulse 74, temperature 98 F (36.7 C), temperature source Oral, resp. rate 14, height '5\' 5"'$  (1.651 m), weight 102.1 kg, SpO2 100 %.  Examination:  Physical Exam Constitutional:      General: She is not in acute distress.    Appearance: Normal appearance.  HENT:     Head: Normocephalic and atraumatic.     Mouth/Throat:     Mouth: Mucous membranes are moist.  Eyes:     Extraocular Movements: Extraocular movements intact.  Cardiovascular:     Rate and Rhythm: Normal rate. Rhythm irregular.  Pulmonary:     Effort: Pulmonary effort is normal.     Breath sounds: Normal breath sounds.  Abdominal:     General: Bowel sounds are normal. There is no distension.     Palpations: Abdomen is soft.     Tenderness: There is no abdominal tenderness.  Musculoskeletal:     Cervical  back: Normal range of motion.     Comments: Bilateral lower extremity significant edema; compartments soft but has TTP in both LE.  Bruising continues to worsen in right lower extremity  Skin:    General: Skin is warm and dry.  Neurological:     Mental Status: She is alert. Mental status is at baseline.  Psychiatric:        Mood and Affect: Mood normal.     Consultants:  Orthopedic surgery  Procedures:  08/22/2022: Left periprosthetic distal femur fracture repair with ORIF  Data Reviewed: Results for orders placed or performed during the hospital encounter of 08/20/22 (from the past 24 hour(s))  Glucose, capillary     Status: Abnormal   Collection Time: 08/23/22  4:24 PM  Result  Value Ref Range   Glucose-Capillary 229 (H) 70 - 99 mg/dL  Hemoglobin and hematocrit, blood     Status: Abnormal   Collection Time: 08/23/22  6:06 PM  Result Value Ref Range   Hemoglobin 8.4 (L) 12.0 - 15.0 g/dL   HCT 27.8 (L) 36.0 - 46.0 %  Glucose, capillary     Status: Abnormal   Collection Time: 08/23/22  8:39 PM  Result Value Ref Range   Glucose-Capillary 208 (H) 70 - 99 mg/dL  Basic metabolic panel     Status: Abnormal   Collection Time: 08/24/22  2:48 AM  Result Value Ref Range   Sodium 136 135 - 145 mmol/L   Potassium 3.7 3.5 - 5.1 mmol/L   Chloride 101 98 - 111 mmol/L   CO2 29 22 - 32 mmol/L   Glucose, Bld 191 (H) 70 - 99 mg/dL   BUN 27 (H) 8 - 23 mg/dL   Creatinine, Ser 0.55 0.44 - 1.00 mg/dL   Calcium 8.7 (L) 8.9 - 10.3 mg/dL   GFR, Estimated >60 >60 mL/min   Anion gap 6 5 - 15  CBC with Differential/Platelet     Status: Abnormal   Collection Time: 08/24/22  2:48 AM  Result Value Ref Range   WBC 16.5 (H) 4.0 - 10.5 K/uL   RBC 3.20 (L) 3.87 - 5.11 MIL/uL   Hemoglobin 8.4 (L) 12.0 - 15.0 g/dL   HCT 27.6 (L) 36.0 - 46.0 %   MCV 86.3 80.0 - 100.0 fL   MCH 26.3 26.0 - 34.0 pg   MCHC 30.4 30.0 - 36.0 g/dL   RDW 18.0 (H) 11.5 - 15.5 %   Platelets 198 150 - 400 K/uL   nRBC 0.6 (H)  0.0 - 0.2 %   Neutrophils Relative % 75 %   Neutro Abs 12.3 (H) 1.7 - 7.7 K/uL   Lymphocytes Relative 12 %   Lymphs Abs 1.9 0.7 - 4.0 K/uL   Monocytes Relative 11 %   Monocytes Absolute 1.8 (H) 0.1 - 1.0 K/uL   Eosinophils Relative 0 %   Eosinophils Absolute 0.0 0.0 - 0.5 K/uL   Basophils Relative 0 %   Basophils Absolute 0.1 0.0 - 0.1 K/uL   Immature Granulocytes 2 %   Abs Immature Granulocytes 0.39 (H) 0.00 - 0.07 K/uL  Magnesium     Status: None   Collection Time: 08/24/22  2:48 AM  Result Value Ref Range   Magnesium 1.9 1.7 - 2.4 mg/dL  Glucose, capillary     Status: Abnormal   Collection Time: 08/24/22  8:00 AM  Result Value Ref Range   Glucose-Capillary 180 (H) 70 - 99 mg/dL  Glucose, capillary     Status: Abnormal   Collection Time: 08/24/22 11:48 AM  Result Value Ref Range   Glucose-Capillary 239 (H) 70 - 99 mg/dL    I have Reviewed nursing notes, Vitals, and Lab results since pt's last encounter. Pertinent lab results : see above I have ordered test including BMP, CBC, Mg I have reviewed the last note from staff over past 24 hours I have discussed pt's care plan and test results with nursing staff, case manager  Time spent: Greater than 50% of the 55 minute visit was spent in counseling/coordination of care for the patient as laid out in the A&P.    LOS: 4 days   Dwyane Dee, MD [facility] 08/24/2022, 2:13 PM

## 2022-08-24 NOTE — Plan of Care (Signed)
  Problem: Safety: Goal: Ability to remain free from injury will improve Outcome: Progressing   Problem: Pain Managment: Goal: General experience of comfort will improve Outcome: Progressing   

## 2022-08-24 NOTE — Progress Notes (Signed)
Subjective: 2 Days Post-Op s/p Procedure(s): OPEN REDUCTION INTERNAL FIXATION (ORIF) DISTAL FEMUR FRACTURE   Patient is alert, pleasantly confused. Reports no pain. Denies chest pain, SOB, Calf pain. No nausea/vomiting. No complaints.    Objective:  PE: VITALS:   Vitals:   08/23/22 0927 08/23/22 2114 08/24/22 0530 08/24/22 0537  BP: (!) 119/48 127/67 123/65 111/79  Pulse: 97 91 73 (!) 56  Resp: '18 18 18 15  '$ Temp: 97.7 F (36.5 C) 98.3 F (36.8 C) 98.2 F (36.8 C) 98.7 F (37.1 C)  TempSrc: Oral Oral Oral   SpO2: 95% 97% 100% 97%  Weight:      Height:       General: laying in bed in no acute distress GI: soft, nontender abdomen MSK: Dorsiflexion and plantarflexion intact at left ankle though ROM is limited, able to wiggle all toes. Feet warm and well perfused. 1+ edema at feet. Dressing intact with mild bloody drainage at most proximal incision site, will leave intact. Pateint endorses sensation to foot.  LABS  Results for orders placed or performed during the hospital encounter of 08/20/22 (from the past 24 hour(s))  Glucose, capillary     Status: Abnormal   Collection Time: 08/23/22 11:59 AM  Result Value Ref Range   Glucose-Capillary 255 (H) 70 - 99 mg/dL  Glucose, capillary     Status: Abnormal   Collection Time: 08/23/22  4:24 PM  Result Value Ref Range   Glucose-Capillary 229 (H) 70 - 99 mg/dL  Hemoglobin and hematocrit, blood     Status: Abnormal   Collection Time: 08/23/22  6:06 PM  Result Value Ref Range   Hemoglobin 8.4 (L) 12.0 - 15.0 g/dL   HCT 27.8 (L) 36.0 - 46.0 %  Glucose, capillary     Status: Abnormal   Collection Time: 08/23/22  8:39 PM  Result Value Ref Range   Glucose-Capillary 208 (H) 70 - 99 mg/dL  Basic metabolic panel     Status: Abnormal   Collection Time: 08/24/22  2:48 AM  Result Value Ref Range   Sodium 136 135 - 145 mmol/L   Potassium 3.7 3.5 - 5.1 mmol/L   Chloride 101 98 - 111 mmol/L   CO2 29 22 - 32 mmol/L   Glucose, Bld  191 (H) 70 - 99 mg/dL   BUN 27 (H) 8 - 23 mg/dL   Creatinine, Ser 0.55 0.44 - 1.00 mg/dL   Calcium 8.7 (L) 8.9 - 10.3 mg/dL   GFR, Estimated >60 >60 mL/min   Anion gap 6 5 - 15  CBC with Differential/Platelet     Status: Abnormal   Collection Time: 08/24/22  2:48 AM  Result Value Ref Range   WBC 16.5 (H) 4.0 - 10.5 K/uL   RBC 3.20 (L) 3.87 - 5.11 MIL/uL   Hemoglobin 8.4 (L) 12.0 - 15.0 g/dL   HCT 27.6 (L) 36.0 - 46.0 %   MCV 86.3 80.0 - 100.0 fL   MCH 26.3 26.0 - 34.0 pg   MCHC 30.4 30.0 - 36.0 g/dL   RDW 18.0 (H) 11.5 - 15.5 %   Platelets 198 150 - 400 K/uL   nRBC 0.6 (H) 0.0 - 0.2 %   Neutrophils Relative % 75 %   Neutro Abs 12.3 (H) 1.7 - 7.7 K/uL   Lymphocytes Relative 12 %   Lymphs Abs 1.9 0.7 - 4.0 K/uL   Monocytes Relative 11 %   Monocytes Absolute 1.8 (H) 0.1 - 1.0 K/uL   Eosinophils  Relative 0 %   Eosinophils Absolute 0.0 0.0 - 0.5 K/uL   Basophils Relative 0 %   Basophils Absolute 0.1 0.0 - 0.1 K/uL   Immature Granulocytes 2 %   Abs Immature Granulocytes 0.39 (H) 0.00 - 0.07 K/uL  Magnesium     Status: None   Collection Time: 08/24/22  2:48 AM  Result Value Ref Range   Magnesium 1.9 1.7 - 2.4 mg/dL    DG FEMUR PORT MIN 2 VIEWS LEFT  Result Date: 08/22/2022 CLINICAL DATA:  Status post internal fixation of left distal femur fracture. EXAM: LEFT FEMUR PORTABLE 2 VIEWS COMPARISON:  Fluoroscopic images of same day. FINDINGS: Status post left hemiarthroplasty and left total knee arthroplasty. Status post surgical internal fixation of comminuted and displaced distal left femoral periprosthetic fracture. IMPRESSION: Status post surgical internal fixation of comminuted and displaced distal left femoral periprosthetic fracture. Electronically Signed   By: Marijo Conception M.D.   On: 08/22/2022 13:56   DG FEMUR MIN 2 VIEWS LEFT  Result Date: 08/22/2022 CLINICAL DATA:  Left femur fracture. EXAM: LEFT FEMUR 2 VIEWS; DG C-ARM 1-60 MIN-NO REPORT Radiation exposure index: 29.447  mGy. COMPARISON:  August 20, 2022. FINDINGS: Twelve intraoperative fluoroscopic images were obtained of the left femur. These images demonstrate surgical internal fixation of distal left femoral fracture. IMPRESSION: Fluoroscopic guidance provided during surgical internal fixation of distal left femoral fracture. Electronically Signed   By: Marijo Conception M.D.   On: 08/22/2022 11:28   DG C-Arm 1-60 Min-No Report  Result Date: 08/22/2022 Fluoroscopy was utilized by the requesting physician.  No radiographic interpretation.   DG C-Arm 1-60 Min-No Report  Result Date: 08/22/2022 Fluoroscopy was utilized by the requesting physician.  No radiographic interpretation.    Assessment/Plan: Principal Problem:   Displaced comminuted fracture of shaft of left femur, initial encounter for closed fracture ([facility]) Active Problems:   OSA on CPAP   Hypothyroidism   Microcytic anemia   History of CVA (cerebrovascular accident)   Chronic diastolic CHF (congestive heart failure) ([facility])   Bedbound   Obesity (BMI 30-39.9)   A-fib ([place])   Type 2 diabetes mellitus without complications ([place])   DNR (do not resuscitate)   Acute urinary retention   Fall at nursing home   Closed fracture of distal end of left femur, initial encounter ([place])   Normocytic anemia   2 Days Post-Op s/p Procedure(s): OPEN REDUCTION INTERNAL FIXATION (ORIF) DISTAL FEMUR FRACTURE Acute blood loss anemia - Hbg stable 8.4  WB: NWB LLE Abx: ancef + vanc Imaging: PACU xrays stable, s/p ORIF Dressing: keep intact until follow up, change PRN if soiled or saturated. DVT prophylaxis: lovenox starting POD1 x4 weeks Follow up: 2 weeks after surgery for a wound check with Dr. Zachery Dakins at [facility], MD Orthopaedic Surgery Contact information:   After hours and holidays please check [email or web address] for group call information for [facility]  Dash Cardarelli A Raimi Guillermo 08/24/2022, 7:28 AM

## 2022-08-25 DIAGNOSIS — I4891 Unspecified atrial fibrillation: Secondary | ICD-10-CM | POA: Diagnosis not present

## 2022-08-25 DIAGNOSIS — R338 Other retention of urine: Secondary | ICD-10-CM | POA: Diagnosis not present

## 2022-08-25 DIAGNOSIS — D509 Iron deficiency anemia, unspecified: Secondary | ICD-10-CM | POA: Diagnosis not present

## 2022-08-25 DIAGNOSIS — S72352A Displaced comminuted fracture of shaft of left femur, initial encounter for closed fracture: Secondary | ICD-10-CM | POA: Diagnosis not present

## 2022-08-25 LAB — MAGNESIUM: Magnesium: 2.2 mg/dL (ref 1.7–2.4)

## 2022-08-25 LAB — GLUCOSE, CAPILLARY
Glucose-Capillary: 172 mg/dL — ABNORMAL HIGH (ref 70–99)
Glucose-Capillary: 227 mg/dL — ABNORMAL HIGH (ref 70–99)
Glucose-Capillary: 185 mg/dL — ABNORMAL HIGH (ref 70–99)
Glucose-Capillary: 236 mg/dL — ABNORMAL HIGH (ref 70–99)

## 2022-08-25 LAB — CBC WITH DIFFERENTIAL/PLATELET
Abs Immature Granulocytes: 0.41 10*3/uL — ABNORMAL HIGH (ref 0.00–0.07)
Basophils Absolute: 0.1 10*3/uL (ref 0.0–0.1)
Basophils Relative: 0 %
Eosinophils Absolute: 0 10*3/uL (ref 0.0–0.5)
Eosinophils Relative: 0 %
HCT: 29.2 % — ABNORMAL LOW (ref 36.0–46.0)
Hemoglobin: 9 g/dL — ABNORMAL LOW (ref 12.0–15.0)
Immature Granulocytes: 2 %
Lymphocytes Relative: 8 %
Lymphs Abs: 1.4 10*3/uL (ref 0.7–4.0)
MCH: 26.9 pg (ref 26.0–34.0)
MCHC: 30.8 g/dL (ref 30.0–36.0)
MCV: 87.4 fL (ref 80.0–100.0)
Monocytes Absolute: 1.7 10*3/uL — ABNORMAL HIGH (ref 0.1–1.0)
Monocytes Relative: 10 %
Neutro Abs: 13.5 10*3/uL — ABNORMAL HIGH (ref 1.7–7.7)
Neutrophils Relative %: 80 %
Platelets: 216 10*3/uL (ref 150–400)
RBC: 3.34 MIL/uL — ABNORMAL LOW (ref 3.87–5.11)
RDW: 19.5 % — ABNORMAL HIGH (ref 11.5–15.5)
WBC: 17.1 10*3/uL — ABNORMAL HIGH (ref 4.0–10.5)
nRBC: 0.4 % — ABNORMAL HIGH (ref 0.0–0.2)

## 2022-08-25 LAB — BASIC METABOLIC PANEL
Anion gap: 8 (ref 5–15)
BUN: 22 mg/dL (ref 8–23)
CO2: 30 mmol/L (ref 22–32)
Calcium: 9 mg/dL (ref 8.9–10.3)
Chloride: 99 mmol/L (ref 98–111)
Creatinine, Ser: 0.57 mg/dL (ref 0.44–1.00)
GFR, Estimated: >60 mL/min (ref >60)
Glucose, Bld: 185 mg/dL — ABNORMAL HIGH (ref 70–99)
Potassium: 3.8 mmol/L (ref 3.5–5.1)
Sodium: 137 mmol/L (ref 135–145)

## 2022-08-25 MED ORDER — CHLORHEXIDINE GLUCONATE CLOTH 2 % EX PADS
6.0000 | MEDICATED_PAD | Freq: Every day | CUTANEOUS | Status: DC
  Administered 2022-08-25 – 2022-08-30 (×6): 6 via TOPICAL

## 2022-08-25 NOTE — Progress Notes (Signed)
Speech Language Pathology Treatment: Dysphagia  Patient Details Name: Melinda Kerr MRN: [id number] DOB: [birth year] Today's Date: 08/25/2022 Time: 1020-1040 SLP Time Calculation (min) (ACUTE ONLY): 20 min  Assessment / Plan / Recommendation Clinical Impression  Pt seen for follow up after bedside swallow evaluation 08/21/22. LPN reports pt screams if positioned upright. She was able to tolerate only 47 degrees upright today. Safe swallow is more difficult in this suboptimal position, so aspiration risk is increased. Evaluating SLP recommended placing a towel roll behind her neck to facilitate proper positioning during meals. Pt accepted trials of thin liquid, puree, and solid textures without oral issues or overt s/s aspiration. Chart review reveals lungs are clear (11/25 CXR) and pt is afebrile. ST to sign off at this time. Please reconsult if needs arise.     HPI HPI: 87 y.o. female admits related fall at SNF. PMH: heart failure, CVA, T2DM, bed bound. Pt is currently receiving medical management for displaced comminuted fracture of shaft of left femur.  She has history of hypertension, diabetes, hyperlipidemia, gout, uterine cancer, heart failure and is brought in by ambulance from a skilled nursing facility after patient fell out of bed.  Fall was unwitnessed.  08/21/2022 Large hiatal hernia., general anesthesia      SLP Plan  Discharge SLP treatment due to goals met     Recommendations for follow up therapy are one component of a multi-disciplinary discharge planning process, led by the attending physician.  Recommendations may be updated based on patient status, additional functional criteria and insurance authorization.    Recommendations  Diet recommendations: Regular;Thin liquid Liquids provided via: Cup;Straw Medication Administration: Whole meds with puree Supervision: Patient able to self feed;Staff to assist with self feeding Compensations: Slow rate;Small sips/bites;Minimize  environmental distractions Postural Changes and/or Swallow Maneuvers: Seated upright 90 degrees;Upright 30-60 min after meal                Oral Care Recommendations: Oral care BID Follow Up Recommendations: Follow physician's recommendations for discharge plan and follow up therapies Assistance recommended at discharge: Frequent or constant Supervision/Assistance SLP Visit Diagnosis: Dysphagia, unspecified (R13.10) Plan: Discharge SLP treatment due to (comment)          Chontel Warning B. Quentin Ore, [facility], [place] Speech Language Pathologist Office: [phone]  Shonna Chock 08/25/2022, 10:42 AM

## 2022-08-25 NOTE — Plan of Care (Signed)
  Problem: Education: Goal: Knowledge of General Education information will improve Description: Including pain rating scale, medication(s)/side effects and non-pharmacologic comfort measures Outcome: Progressing   Problem: Activity: Goal: Risk for activity intolerance will decrease Outcome: Progressing   Problem: Coping: Goal: Level of anxiety will decrease Outcome: Progressing   

## 2022-08-25 NOTE — Progress Notes (Signed)
Progress Note    Melinda Kerr   OZH:[id number]  DOB: Nov 23, 1934  DOA: 08/20/2022     5 PCP: Maury Dus, MD  Initial CC: fall out of bed  Hospital Course: Melinda Kerr is an 87 yo female with PMH afib, dementia, obesity, chronic dCHF, CVA, DMII who presented after a fall out of the bed. She resides at [facility]. She was brought to the ER after her fall.  X-rays and CT head obtained on admission.  Imaging shows left comminuted displaced distal femoral fracture.  She was admitted for evaluation with orthopedic surgery.  She underwent ORIF on 08/22/2022.  Interval History:  No events overnight.  Still having ongoing pain in lower extremities although she does state that this is somewhat chronic. Also still having difficulty voiding after catheter removal.  Bladder scans to continue.  Assessment and Plan: * Displaced comminuted fracture of shaft of left femur, initial encounter for closed fracture ([place]) - s/p fall from bed; likely some contribution from her dementia - xray shows left comminuted displaced distal femoral fracture - evaluated by ortho and underwent ORIF on 08/22/22 - continue pain control  - follow up PT/OT evals post op - patient from [facility] - continue neurovas checks to B/L LE due to significant bruising/swelling  A-fib ([place]) - patient does have prior history of afib; noted on EKG on 02/03/21; at the time she had repair of original left femoral fracture; likely had afib precipitated by the acute stress - she's not been on anticoagulation and I agree, risk outweighs benefit given age and bleed risk - rate control strategy will be pursued; still some RVR on admission - TSH normal, 1.007 - continue lopressor - repeat echo now: EF 60-65%, no RWMA, mild LVH, indeterminate diastology (was Gr II DD previously on May 2022 echo); overall her echo is stable and unchanged  Chronic diastolic CHF (congestive heart failure) ([place]) - no s/s exacerbation - noted to have Gr 2 DD on  May 2022 echo; repeat echo unable to appreciate diastology; EF remains preserved at 60-65%  Acute urinary retention - not unexpected given dementia and hip fx at this time - foley attempted by nursing multiple times without success - foley able to be placed in OR  - foley d/c'd on 11/27 and bladder scans resumed - still some issues with retention 11/28; may need straight cath vs replacement of foley if fails to void  Microcytic anemia - Baseline hemoglobin around 9 to 10 g/dL.  Admitted with hemoglobin 8.6 g/dL with further downtrend to 5.8 g/dL - Etiology suspected due to bleeding from fall - Right hip actually more edematous and bruised. -Continue monitoring both thighs for any signs of possible compartment syndrome - s/p 2  units PRBC on 11/24 and again 2 units PRBC on 11/25 - required RIJ placement as PICC was unable to be placed - continue trending H/H -Hemoglobin appears to have stabilized  DNR (do not resuscitate) Verified DNR orders from SNF order sheet  Type 2 diabetes mellitus without complications ([place]) - G2X 7.5% - continue SSI and CBG monitoring  Obesity (BMI 52-84.1) - Complicates overall prognosis and care - Body mass index is 37.46 kg/m.  Bedbound Chronic.  History of CVA (cerebrovascular accident) Chronic. - s/p surgery, would be reasonable to continue on asa   Hypothyroidism Stable. Continue synthroid 125 mcg daily - TSH normal  OSA on CPAP Stable. Continue CPAP.   Old records reviewed in assessment of this patient  Antimicrobials:   DVT prophylaxis:  enoxaparin (LOVENOX) injection 40 mg Start: 08/23/22 1200 SCDs Start: 08/20/22 0731   Code Status:   Code Status: DNR  Mobility Assessment (last 72 hours)     Mobility Assessment     Row Name 08/24/22 2148 08/24/22 0900 08/24/22 0735 08/23/22 1901 08/23/22 0707   Does patient have an order for bedrest or is patient medically unstable -- Yes- Bedfast (Level 1) - Complete Yes- Bedfast (Level  1) - Complete Yes- Bedfast (Level 1) - Complete Yes- Bedfast (Level 1) - Complete   What is the highest level of mobility based on the progressive mobility assessment? Level 1 (Bedfast) - Unable to balance while sitting on edge of bed Level 1 (Bedfast) - Unable to balance while sitting on edge of bed Level 1 (Bedfast) - Unable to balance while sitting on edge of bed Level 1 (Bedfast) - Unable to balance while sitting on edge of bed Level 1 (Bedfast) - Unable to balance while sitting on edge of bed   Is the above level different from baseline mobility prior to current illness? Yes - Recommend PT order No - Consider discontinuing PT/OT  baseline prior to illness No - Consider discontinuing PT/OT Yes - Recommend PT order Yes - Recommend PT order    [place] Name 08/22/22 2145           Does patient have an order for bedrest or is patient medically unstable Yes- Bedfast (Level 1) - Complete       What is the highest level of mobility based on the progressive mobility assessment? Level 1 (Bedfast) - Unable to balance while sitting on edge of bed       Is the above level different from baseline mobility prior to current illness? Yes - Recommend PT order                Barriers to discharge:  Disposition Plan:  [facility] maybe 2-3 days still Status is: Inpt  Objective: Blood pressure 105/66, pulse 88, temperature 98.6 F (37 C), temperature source Oral, resp. rate 17, height '5\' 5"'$  (1.651 m), weight 102.1 kg, SpO2 100 %.  Examination:  Physical Exam Constitutional:      General: She is not in acute distress.    Appearance: Normal appearance.  HENT:     Head: Normocephalic and atraumatic.     Mouth/Throat:     Mouth: Mucous membranes are moist.  Eyes:     Extraocular Movements: Extraocular movements intact.  Cardiovascular:     Rate and Rhythm: Normal rate. Rhythm irregular.  Pulmonary:     Effort: Pulmonary effort is normal.     Breath sounds: Normal breath sounds.  Abdominal:     General:  Bowel sounds are normal. There is no distension.     Palpations: Abdomen is soft.     Tenderness: There is no abdominal tenderness.  Musculoskeletal:     Cervical back: Normal range of motion.     Comments: Bilateral lower extremity significant edema; compartments soft but has TTP in both LE.  Bruising continues to worsen in right lower extremity  Skin:    General: Skin is warm and dry.  Neurological:     Mental Status: She is alert. Mental status is at baseline.  Psychiatric:        Mood and Affect: Mood normal.     Consultants:  Orthopedic surgery  Procedures:  08/22/2022: Left periprosthetic distal femur fracture repair with ORIF  Data Reviewed: Results for orders placed or performed during the hospital  encounter of 08/20/22 (from the past 24 hour(s))  Glucose, capillary     Status: Abnormal   Collection Time: 08/24/22  5:04 PM  Result Value Ref Range   Glucose-Capillary 274 (H) 70 - 99 mg/dL  Glucose, capillary     Status: Abnormal   Collection Time: 08/24/22  9:12 PM  Result Value Ref Range   Glucose-Capillary 229 (H) 70 - 99 mg/dL  Basic metabolic panel     Status: Abnormal   Collection Time: 08/25/22  4:44 AM  Result Value Ref Range   Sodium 137 135 - 145 mmol/L   Potassium 3.8 3.5 - 5.1 mmol/L   Chloride 99 98 - 111 mmol/L   CO2 30 22 - 32 mmol/L   Glucose, Bld 185 (H) 70 - 99 mg/dL   BUN 22 8 - 23 mg/dL   Creatinine, Ser 0.57 0.44 - 1.00 mg/dL   Calcium 9.0 8.9 - 10.3 mg/dL   GFR, Estimated >60 >60 mL/min   Anion gap 8 5 - 15  CBC with Differential/Platelet     Status: Abnormal   Collection Time: 08/25/22  4:44 AM  Result Value Ref Range   WBC 17.1 (H) 4.0 - 10.5 K/uL   RBC 3.34 (L) 3.87 - 5.11 MIL/uL   Hemoglobin 9.0 (L) 12.0 - 15.0 g/dL   HCT 29.2 (L) 36.0 - 46.0 %   MCV 87.4 80.0 - 100.0 fL   MCH 26.9 26.0 - 34.0 pg   MCHC 30.8 30.0 - 36.0 g/dL   RDW 19.5 (H) 11.5 - 15.5 %   Platelets 216 150 - 400 K/uL   nRBC 0.4 (H) 0.0 - 0.2 %   Neutrophils  Relative % 80 %   Neutro Abs 13.5 (H) 1.7 - 7.7 K/uL   Lymphocytes Relative 8 %   Lymphs Abs 1.4 0.7 - 4.0 K/uL   Monocytes Relative 10 %   Monocytes Absolute 1.7 (H) 0.1 - 1.0 K/uL   Eosinophils Relative 0 %   Eosinophils Absolute 0.0 0.0 - 0.5 K/uL   Basophils Relative 0 %   Basophils Absolute 0.1 0.0 - 0.1 K/uL   Immature Granulocytes 2 %   Abs Immature Granulocytes 0.41 (H) 0.00 - 0.07 K/uL  Magnesium     Status: None   Collection Time: 08/25/22  4:44 AM  Result Value Ref Range   Magnesium 2.2 1.7 - 2.4 mg/dL  Glucose, capillary     Status: Abnormal   Collection Time: 08/25/22  7:41 AM  Result Value Ref Range   Glucose-Capillary 172 (H) 70 - 99 mg/dL  Glucose, capillary     Status: Abnormal   Collection Time: 08/25/22 11:40 AM  Result Value Ref Range   Glucose-Capillary 227 (H) 70 - 99 mg/dL    I have Reviewed nursing notes, Vitals, and Lab results since pt's last encounter. Pertinent lab results : see above I have ordered test including BMP, CBC, Mg I have reviewed the last note from staff over past 24 hours I have discussed pt's care plan and test results with nursing staff, case manager  Time spent: Greater than 50% of the 55 minute visit was spent in counseling/coordination of care for the patient as laid out in the A&P.    LOS: 5 days   Dwyane Dee, MD [facility] 08/25/2022, 2:17 PM

## 2022-08-26 ENCOUNTER — Inpatient Hospital Stay (HOSPITAL_COMMUNITY): Payer: Medicare Other

## 2022-08-26 DIAGNOSIS — S72352A Displaced comminuted fracture of shaft of left femur, initial encounter for closed fracture: Secondary | ICD-10-CM | POA: Diagnosis not present

## 2022-08-26 DIAGNOSIS — R600 Localized edema: Secondary | ICD-10-CM

## 2022-08-26 LAB — CBC WITH DIFFERENTIAL/PLATELET
Abs Immature Granulocytes: 0.34 10*3/uL — ABNORMAL HIGH (ref 0.00–0.07)
Basophils Absolute: 0.1 10*3/uL (ref 0.0–0.1)
Basophils Relative: 0 %
Eosinophils Absolute: 0 10*3/uL (ref 0.0–0.5)
Eosinophils Relative: 0 %
HCT: 29.4 % — ABNORMAL LOW (ref 36.0–46.0)
Hemoglobin: 8.8 g/dL — ABNORMAL LOW (ref 12.0–15.0)
Immature Granulocytes: 2 %
Lymphocytes Relative: 13 %
Lymphs Abs: 1.8 10*3/uL (ref 0.7–4.0)
MCH: 26.2 pg (ref 26.0–34.0)
MCHC: 29.9 g/dL — ABNORMAL LOW (ref 30.0–36.0)
MCV: 87.5 fL (ref 80.0–100.0)
Monocytes Absolute: 1.4 10*3/uL — ABNORMAL HIGH (ref 0.1–1.0)
Monocytes Relative: 10 %
Neutro Abs: 10.5 10*3/uL — ABNORMAL HIGH (ref 1.7–7.7)
Neutrophils Relative %: 75 %
Platelets: 268 10*3/uL (ref 150–400)
RBC: 3.36 MIL/uL — ABNORMAL LOW (ref 3.87–5.11)
RDW: 20.7 % — ABNORMAL HIGH (ref 11.5–15.5)
WBC: 14 10*3/uL — ABNORMAL HIGH (ref 4.0–10.5)
nRBC: 0.2 % (ref 0.0–0.2)

## 2022-08-26 LAB — BASIC METABOLIC PANEL
Anion gap: 5 (ref 5–15)
BUN: 21 mg/dL (ref 8–23)
CO2: 31 mmol/L (ref 22–32)
Calcium: 8.9 mg/dL (ref 8.9–10.3)
Chloride: 100 mmol/L (ref 98–111)
Creatinine, Ser: 0.6 mg/dL (ref 0.44–1.00)
GFR, Estimated: >60 mL/min (ref >60)
Glucose, Bld: 193 mg/dL — ABNORMAL HIGH (ref 70–99)
Potassium: 3.6 mmol/L (ref 3.5–5.1)
Sodium: 136 mmol/L (ref 135–145)

## 2022-08-26 LAB — MAGNESIUM: Magnesium: 2 mg/dL (ref 1.7–2.4)

## 2022-08-26 LAB — GLUCOSE, CAPILLARY
Glucose-Capillary: 162 mg/dL — ABNORMAL HIGH (ref 70–99)
Glucose-Capillary: 166 mg/dL — ABNORMAL HIGH (ref 70–99)
Glucose-Capillary: 186 mg/dL — ABNORMAL HIGH (ref 70–99)

## 2022-08-26 MED ORDER — INSULIN GLARGINE-YFGN 100 UNIT/ML ~~LOC~~ SOLN
10.0000 [IU] | Freq: Every day | SUBCUTANEOUS | Status: DC
  Administered 2022-08-27 – 2022-08-31 (×5): 10 [IU] via SUBCUTANEOUS
  Filled 2022-08-26 (×5): qty 0.1

## 2022-08-26 MED ORDER — ACETAMINOPHEN 325 MG PO TABS
650.0000 mg | ORAL_TABLET | Freq: Four times a day (QID) | ORAL | Status: DC | PRN
  Administered 2022-08-26 – 2022-08-27 (×2): 650 mg via ORAL
  Filled 2022-08-26 (×2): qty 2

## 2022-08-26 MED ORDER — METOPROLOL TARTRATE 12.5 MG HALF TABLET
12.5000 mg | ORAL_TABLET | Freq: Two times a day (BID) | ORAL | Status: DC
  Administered 2022-08-26 – 2022-08-28 (×6): 12.5 mg via ORAL
  Filled 2022-08-26 (×6): qty 1

## 2022-08-26 MED ORDER — POLYETHYLENE GLYCOL 3350 17 G PO PACK
17.0000 g | PACK | Freq: Two times a day (BID) | ORAL | Status: DC
  Administered 2022-08-26 – 2022-08-28 (×5): 17 g via ORAL
  Filled 2022-08-26 (×5): qty 1

## 2022-08-26 MED ORDER — MORPHINE SULFATE (PF) 2 MG/ML IV SOLN
2.0000 mg | INTRAVENOUS | Status: DC | PRN
  Administered 2022-08-26 – 2022-08-27 (×3): 2 mg via INTRAVENOUS
  Filled 2022-08-26 (×3): qty 1

## 2022-08-26 MED ORDER — IOHEXOL 9 MG/ML PO SOLN
500.0000 mL | ORAL | Status: AC
  Administered 2022-08-26 (×2): 500 mL via ORAL

## 2022-08-26 MED ORDER — SORBITOL 70 % SOLN
200.0000 mL | TOPICAL_OIL | Freq: Once | ORAL | Status: AC
  Administered 2022-08-27: 200 mL via RECTAL
  Filled 2022-08-26: qty 60

## 2022-08-26 MED ORDER — IOHEXOL 300 MG/ML  SOLN
100.0000 mL | Freq: Once | INTRAMUSCULAR | Status: AC | PRN
  Administered 2022-08-26: 100 mL via INTRAVENOUS

## 2022-08-26 NOTE — Plan of Care (Signed)
  Problem: Elimination: Goal: Will not experience complications related to urinary retention Outcome: Progressing   Problem: Skin Integrity: Goal: Risk for impaired skin integrity will decrease Outcome: Progressing   Problem: Coping: Goal: Ability to adjust to condition or change in health will improve Outcome: Progressing

## 2022-08-26 NOTE — Care Management Important Message (Signed)
Important Message  Patient Details IM Letter placed in Patient's room. Name: Melinda Kerr MRN: [id number] Date of Birth: Dec 18, 1934   Medicare Important Message Given:  Yes     Kerin Salen 08/26/2022, 12:51 PM

## 2022-08-26 NOTE — Progress Notes (Signed)
Bilateral lower extremity venous duplex has been completed. Preliminary results can be found in CV Proc through chart review.   08/26/22 2:38 PM Melinda Kerr RVT

## 2022-08-26 NOTE — Consult Note (Addendum)
Consultation  Referring Provider: [facility]/ Posey Pronto Primary Care Physician:  Maury Dus, MD Primary Gastroenterologist:  Dr.Cirigliano  Reason for Consultation: Abdominal distention, possible sigmoid volvulus  HPI: Melinda Kerr is a 87 y.o. female, nursing home patient who resides at [facility] who was admitted 1 week ago after she fell out of bed.  She was found to have a left femur fracture which is being managed nonoperatively currently. Patient has numerous significant comorbidities including adult onset diabetes mellitus, morbid obesity, prior history of CVA, dementia, obstructive sleep apnea with CPAP use, respiratory failure on chronic nasal O2 at 3 L, atrial fibrillation, hypertension and prior history of uterine cancer.  She also has history of congestive heart failure GERD and diverticulosis. She was recently seen in our office on 07/24/2022 by Vicie Mutters, PA-C for iron deficiency anemia which has been a chronic problem and present since 2015.  Baseline hemoglobin of 10-11.  Was recently heme positive. Most recent EGD done in 2020 (Dr. Cristina Gong while hospitalized) for hematemesis with finding of a large hiatal hernia with Lysbeth Galas erosions. She has not had colonoscopy documented in our system, though used to follow with Dr. Cristina Gong.  At the time of that office visit she was felt to be a poor candidate for endoscopic evaluation due to her multiple significant comorbidities and chronic oxygen use of 3 L nasal cannula, and decision was made to manage her medically to increase PPI to twice daily, to consider IV iron therapy.  She has been requiring narcotics for pain control for her femur fracture, which was found to be a displaced comminuted fracture of the shaft of the left femur. Over the past couple of days she has been noted to have increasing ecchymosis and edema in the right lower extremity, and has been complaining of pain in the right leg.  Films today show an acute  fracture of the proximal tibia and fibula on the right and right knee replacement, chronic fracture of the distal femur on the right.  She was also noted by hospitalist today to have a distended tympanitic abdomen this afternoon and we were asked to evaluate. Patient herself has no complaints of abdominal pain , not had any nausea or vomiting, she does not feel that her abdomen is significantly distended.  Nursing reports that she has had stooling today.  Abdominal films this afternoon with a distended loop of colon in the mid abdomen not rule out a sigmoid volvulus and CT of the abdomen was recommended by radiology  Plan is for stat CT which should be done between 6 and 8 PM this evening.  Labs today reviewe WBC 14.0 trending down Hemoglobin 8.8/hematocrit 29.4 stable Potassium 3.6/BUN 21/creatinine 0.6   Past Medical History:  Diagnosis Date   Acute CVA (cerebrovascular accident) ([place]) 05/15/2019   Arthritis    "shoulders, knees, lower back" (07/14/2017)   Atrial fibrillation ([place]) 02/03/2021   CHF (congestive heart failure) ([place])    "while in hospital w/hysterectomy in 2017"   Chronic lower back pain    Depression    Displaced comminuted fracture of shaft of right femur, initial encounter for closed fracture ([place]) 04/08/2019   Diverticulitis of large intestine with abscess 07/13/2017   DVT (deep venous thrombosis) (HCC)    Dyspnea    "cause I'm over weight"   Esophageal reflux    Femur fracture, left ([place]) 02/03/2021   Gout    Hiatal hernia    Hyperlipidemia    under control  Hypertension    Hypothyroidism    Morbid obesity with BMI of 40.0-44.9, adult (HCC)    Nocturia    OSA on CPAP    "not wearing it when I sleep in my lift chair" (07/14/2017)   Osteoarthritis    Phlebitis    hx of   Pneumonia ?1989   Spondylosis    with scoliosis   TIA (transient ischemic attack) 05/14/2019   Type II diabetes mellitus (HCC)    borderline , diet controlled    Urinary frequency     Uterine cancer ([place])    S/P chemo, radiation, hysterectomy    Past Surgical History:  Procedure Laterality Date   ANTERIOR APPROACH HEMI HIP ARTHROPLASTY Left 02/05/2021   Procedure: ANTERIOR APPROACH HEMI HIP ARTHROPLASTY;  Surgeon: Rod Can, MD;  Location: [facility];  Service: Orthopedics;  Laterality: Left;   APPENDECTOMY     APPLICATION OF WOUND VAC Left 03/08/2021   Procedure: APPLICATION OF WOUND VAC;  Surgeon: Rod Can, MD;  Location: [place];  Service: Orthopedics;  Laterality: Left;   CATARACT EXTRACTION, BILATERAL Bilateral 2018   COLONOSCOPY     DILATATION & CURETTAGE/HYSTEROSCOPY WITH MYOSURE N/A 03/04/2016   Procedure: DILATATION & CURETTAGE/HYSTEROSCOPY ;  Surgeon: Azucena Fallen, MD;  Location: [facility];  Service: Gynecology;  Laterality: N/A;  polyp removal    ESOPHAGOGASTRODUODENOSCOPY (EGD) WITH PROPOFOL N/A 11/14/2018   Procedure: ESOPHAGOGASTRODUODENOSCOPY (EGD) WITH PROPOFOL;  Surgeon: Wonda Horner, MD;  Location[facility];  Service: Endoscopy;  Laterality: N/A;   FEMUR IM NAIL Right 04/09/2019   Procedure: INTRAMEDULLARY (IM) NAIL INTERTROCH;  Surgeon: Renette Butters, MD;  Location: [place];  Service: Orthopedics;  Laterality: Right;   HARDWARE REMOVAL Right 05/09/2020   Procedure: HARDWARE REMOVAL RIGHT FEMUR;  Surgeon: Shona Needles, MD;  Location: [place];  Service: Orthopedics;  Laterality: Right;   HERNIA REPAIR     I & D EXTREMITY Left 03/08/2021   Procedure: LEFT HIP IRRIGATION AND DEBRIDEMENT;  Surgeon: Rod Can, MD;  Location: [place];  Service: Orthopedics;  Laterality: Left;   IR GENERIC HISTORICAL  05/15/2016   IR US GUIDE VASC ACCESS RIGHT 05/15/2016 Aletta Edouard, MD [facility]   IR GENERIC HISTORICAL  05/15/2016   IR FLUORO GUIDE CV LINE RIGHT 05/15/2016 Aletta Edouard, MD [facility]   IR REMOVAL TUN ACCESS W/ PORT W/O FL MOD SED  04/19/2018   LAPAROSCOPIC CHOLECYSTECTOMY  2004   LAPAROSCOPIC INCISIONAL / UMBILICAL / VENTRAL HERNIA  REPAIR  2004   "w/gallbladder OR"   LYMPH NODE BIOPSY N/A 03/26/2016   Procedure: [place] LYMPH NODE BIOPSY;  Surgeon: Everitt Amber, MD;  Location: [facility];  Service: Gynecology;  Laterality: N/A;   ORIF FEMUR FRACTURE Right 05/09/2020   Procedure: OPEN REDUCTION INTERNAL FIXATION (ORIF) DISTAL FEMUR FRACTURE;  Surgeon: Shona Needles, MD;  Location: [place];  Service: Orthopedics;  Laterality: Right;   ORIF FEMUR FRACTURE Left 08/22/2022   Procedure: OPEN REDUCTION INTERNAL FIXATION (ORIF) DISTAL FEMUR FRACTURE;  Surgeon: Willaim Sheng, MD;  Location: [facility];  Service: Orthopedics;  Laterality: Left;   REVERSE SHOULDER ARTHROPLASTY Right 10/26/2018   Procedure: REVERSE SHOULDER ARTHROPLASTY;  Surgeon: Nicholes Stairs, MD;  Location: [place];  Service: Orthopedics;  Laterality: Right;   ROBOTIC ASSISTED TOTAL HYSTERECTOMY WITH BILATERAL SALPINGO OOPHERECTOMY N/A 03/26/2016   Procedure: XI ROBOTIC ASSISTED TOTAL Laproscopic HYSTERECTOMY WITH BILATERAL SALPINGO OOPHORECTOMY;  Surgeon: Everitt Amber, MD;  Location: [facility];  Service: Gynecology;  Laterality: N/A;  TOTAL KNEE ARTHROPLASTY Left 10/09/2013   Procedure: LEFT TOTAL KNEE ARTHROPLASTY;  Surgeon: [name], MD;  Location: [facility];  Service: Orthopedics;  Laterality: Left;   TOTAL KNEE ARTHROPLASTY Right 04/09/2014   Procedure: RIGHT TOTAL KNEE ARTHROPLASTY;  Surgeon: [name], MD;  Location: [facility];  Service: Orthopedics;  Laterality: Right;   TUBAL LIGATION  1982   VARICOSE VEIN SURGERY Bilateral 1968   WISDOM TOOTH EXTRACTION  1963    Prior to Admission medications   Medication Sig Start Date End Date Taking? Authorizing Provider  allopurinol (ZYLOPRIM) 300 MG tablet Take 300 mg by mouth daily.    Yes [provider]  aspirin EC 81 MG tablet Take 81 mg by mouth daily. Swallow whole.   Yes [provider]  baclofen (LIORESAL) 10 MG tablet Take 1 tablet (10 mg total) by mouth 3 (three) times daily as needed  for muscle spasms. Patient taking differently: Take 10 mg by mouth every 8 (eight) hours as needed for muscle spasms. 04/12/19  Yes Swayze, Ava, DO  calcium carbonate (TUMS - DOSED IN MG ELEMENTAL CALCIUM) 500 MG chewable tablet Chew 1 tablet by mouth daily.   Yes [provider]  ciprofloxacin (CIPRO) 500 MG tablet Take 500 mg by mouth 2 (two) times daily.   Yes [provider]  DULoxetine HCl 30 MG CSDR Take 30 mg by mouth daily.   Yes [provider]  furosemide (LASIX) 40 MG tablet Take 0.5 tablets (20 mg total) by mouth daily. Patient taking differently: Take 40 mg by mouth daily. 05/11/20  Yes Lavina Hamman, MD  [employer] [facility]) OINT Apply 1 Application topically in the morning, at noon, and at bedtime. Apply to buttocks   Yes [provider]  insulin glargine (LANTUS) 100 unit/mL SOPN Inject 30 Units into the skin at bedtime.   Yes [provider]  insulin lispro (HUMALOG KWIKPEN) 100 UNIT/ML KwikPen Inject 5 Units into the skin 3 (three) times daily. Before meals   Yes [provider]  levothyroxine (SYNTHROID, LEVOTHROID) 125 MCG tablet Take 125 mcg by mouth daily before breakfast.   Yes [provider]  Liniments (VICKS BABYRUB) CREA Apply 1 Application topically in the morning and at bedtime. Apply under nostril for irritation   Yes [provider]  melatonin 5 MG TABS Take 5 mg by mouth at bedtime.   Yes [provider]  oxyCODONE (OXY IR/ROXICODONE) 5 MG immediate release tablet Take 5 mg by mouth every 4 (four) hours as needed for severe pain.   Yes [provider]  OXYGEN Inhale 2 L/min into the lungs See admin instructions. 2 L/min every shift for shortness of breath and every 8 hours as needed to maintain sats of at least 90%   Yes [provider]  pantoprazole (PROTONIX) 40 MG tablet Take 1 tablet (40 mg total) by mouth 2 (two) times daily before a meal. 07/24/22  10/22/22 Yes Vicie Mutters R, PA-C  polyethylene glycol (MIRALAX / GLYCOLAX) packet Take 17 g by mouth daily as needed for mild constipation or moderate constipation. Patient taking differently: Take 17 g by mouth daily. 04/12/14  Yes Perkins, Alexzandrew L, PA-C  Polysaccharide-Iron Complex 150 MG CAPS Take 150 mg by mouth in the morning.   Yes [provider]  senna-docusate (SENOKOT-S) 8.6-50 MG tablet Take 1 tablet by mouth daily.   Yes [provider]  Simethicone 250 MG CAPS Take 250 mg by mouth every 8 (  eight) hours as needed (gas).   Yes [provider]  spironolactone (ALDACTONE) 50 MG tablet Take 50 mg by mouth daily.   Yes [provider]  sucralfate (CARAFATE) 1 g tablet Take 1 tablet (1 g total) by mouth 4 (four) times daily -  with meals and at bedtime. 07/24/22  Yes Vicie Mutters R, PA-C  UNABLE TO FIND Apply 1 Application topically in the morning and at bedtime. Med Name: micro guard fungal powder   Apply under right breast for rash for 14 days starting 08/14/22   Yes [provider]  Vitamin D, Ergocalciferol, (DRISDOL) 1.25 MG (50000 UT) CAPS capsule Take 50,000 Units by mouth every Monday.   Yes [provider]  zolpidem (AMBIEN) 10 MG tablet Take 10 mg by mouth at bedtime.   Yes [provider]  albuterol (VENTOLIN HFA) 108 (90 Base) MCG/ACT inhaler Inhale 2 puffs into the lungs every 8 (eight) hours as needed for wheezing. Patient not taking: Reported on 08/20/2022    [provider]  citalopram (CELEXA) 10 MG tablet Take 10 mg by mouth daily. Patient not taking: Reported on 08/20/2022 03/20/19   [provider]  diclofenac Sodium (VOLTAREN) 1 % GEL Apply 2 g topically every 12 (twelve) hours as needed (pain). Patient not taking: Reported on 08/20/2022    [provider]  docusate sodium (COLACE) 100 MG capsule Take 1 capsule (100 mg total) by mouth 2 (two) times daily. Patient not  taking: Reported on 08/20/2022 05/16/19   [place] Donnamarie Poag, DO  hydrOXYzine (ATARAX/VISTARIL) 25 MG tablet Take 25 mg by mouth at bedtime. Patient not taking: Reported on 08/20/2022    [provider]  metoCLOPramide (REGLAN) 5 MG tablet Take 5 mg by mouth 2 (two) times daily at 8 am and 10 pm. Patient not taking: Reported on 08/20/2022    [provider]  nystatin (MYCOSTATIN/NYSTOP) powder Apply 1 Bottle topically See admin instructions. Apply topically to abdominal skin folds and under bilateral breast for intertrigo - twice daily - please make sure skin has been cleaned and is dry before applying Patient not taking: Reported on 08/20/2022    [provider]  spironolactone (ALDACTONE) 25 MG tablet Take 25 mg by mouth daily. Patient not taking: Reported on 08/20/2022    [provider]    Current Facility-Administered Medications  Medication Dose Route Frequency Provider Last Rate Last Admin   acetaminophen (TYLENOL) tablet 650 mg  650 mg Oral Q6H PRN Lavina Hamman, MD       Chlorhexidine Gluconate Cloth 2 % PADS 6 each  6 each Topical Daily Dwyane Dee, MD   6 each at 08/26/22 1127   enoxaparin (LOVENOX) injection 40 mg  40 mg Subcutaneous Daily Merlene Pulling K, PA-C   40 mg at 08/26/22 0856   [START ON 08/27/2022] insulin glargine-yfgn (SEMGLEE) injection 10 Units  10 Units Subcutaneous Daily Lavina Hamman, MD       levothyroxine (SYNTHROID) tablet 125 mcg  125 mcg Oral Q0600 Willaim Sheng, MD   125 mcg at 08/26/22 0658   metoprolol tartrate (LOPRESSOR) tablet 12.5 mg  12.5 mg Oral BID Lavina Hamman, MD   12.5 mg at 08/26/22 0856   morphine (PF) 2 MG/ML injection 2 mg  2 mg Intravenous Q2H PRN Lavina Hamman, MD       ondansetron [facility]) injection 4 mg  4 mg Intravenous Q6H PRN Willaim Sheng, MD  pantoprazole (PROTONIX) EC tablet 40 mg  40 mg Oral BID AC Willaim Sheng, MD   40 mg at 08/26/22 1650   sodium chloride flush  (NS) 0.9 % injection 10-40 mL  10-40 mL Intracatheter Q12H Willaim Sheng, MD   10 mL at 08/25/22 2300   sodium chloride flush (NS) 0.9 % injection 10-40 mL  10-40 mL Intracatheter PRN Willaim Sheng, MD   30 mL at 08/24/22 1756    Allergies as of 08/20/2022 - Review Complete 08/20/2022  Allergen Reaction Noted   Oxybutynin Other (See Comments) 08/10/2013    Family History  Problem Relation Age of Onset   Heart disease Mother    Heart disease Father    CVA Father     Social History   Socioeconomic History   Marital status: Widowed    Spouse name: Not on file   Number of children: 0   Years of education: Not on file   Highest education level: Not on file  Occupational History   Occupation: Tax collection retired  Tobacco Use   Smoking status: Never   Smokeless tobacco: Never  Vaping Use   Vaping Use: Never used  Substance and Sexual Activity   Alcohol use: No   Drug use: No   Sexual activity: Never  Other Topics Concern   Not on file  Social History Narrative   Not on file   Social Determinants of Health   Financial Resource Strain: Not on file  Food Insecurity: Not on file  Transportation Needs: Not on file  Physical Activity: Not on file  Stress: Not on file  Social Connections: Not on file  Intimate Partner Violence: Not on file    Review of Systems: Pertinent positive and negative review of systems were noted in the above HPI section.  All other review of systems was otherwise negative.  Physical Exam: Vital signs in last 24 hours: Temp:  [98 F (36.7 C)-98.2 F (36.8 C)] 98.2 F (36.8 C) (11/29 1303) Pulse Rate:  [83-95] 88 (11/29 1303) Resp:  [17-20] 18 (11/29 1303) BP: (103-129)/(66-88) 129/77 (11/29 1303) SpO2:  [94 %-100 %] 94 % (11/29 1303) Last BM Date : 08/25/22 General:   Alert,  Well-developed, elderly, obese white female pleasant and cooperative in NAD-uncomfortable due to lower extremity pain-on nasal O2 Head:  Normocephalic  and atraumatic. Eyes:  Sclera clear, no icterus.   Conjunctiva pink. Ears: somewhat hard of hearing Nose:  No deformity, discharge,  or lesions. Mouth:  No deformity or lesions.   Neck:  Supple; no masses or thyromegaly. Lungs:  Clear throughout to auscultation, decreased breath sounds bilaterally Heart:  Regular rate and rhythm; no murmurs, clicks, rubs,  or gallops. Abdomen: Morbidly obese, nontender, no guarding or rebound bowel sounds present, somewhat high-pitched, distended in mid abdomen tympanitic Rectal: Done per Dr. Ashley Jacobs green stool in the rectal vault  Pulses:  Normal pulses noted. Extremities: Significant edema bilateral lower extremities bruising bilateral lower extremities right now worse than left Neurologic:  Alert and  oriented x2  grossly normal neurologically. Skin:  Intact without significant lesions or rashes.. Psych:  Alert and cooperative. Normal mood and affect.  Intake/Output from previous day: 11/28 0701 - 11/29 0700 In: 1710 [P.O.:1680; I.V.:30] Out: 2000 [Urine:2000] Intake/Output this shift: Total I/O In: 740 [P.O.:740] Out: 500 [Urine:500]  Lab Results: Recent Labs    08/24/22 0248 08/25/22 0444 08/26/22 0329  WBC 16.5* 17.1* 14.0*  HGB 8.4* 9.0* 8.8*  HCT 27.6* 29.2* 29.4*  PLT 198 216 268   BMET Recent Labs    08/24/22 0248 08/25/22 0444 08/26/22 0329  NA 136 137 136  K 3.7 3.8 3.6  CL 101 99 100  CO2 '29 30 31  '$ GLUCOSE 191* 185* 193*  BUN 27* 22 21  CREATININE 0.55 0.57 0.60  CALCIUM 8.7* 9.0 8.9   LFT No results for input(s): "PROT", "ALBUMIN", "AST", "ALT", "ALKPHOS", "BILITOT", "BILIDIR", "IBILI" in the last 72 hours. PT/INR No results for input(s): "LABPROT", "INR" in the last 72 hours. Hepatitis Panel No results for input(s): "HEPBSAG", "HCVAB", "HEPAIGM", "HEPBIGM" in the last 72 hours.   IMPRESSION:  #32  87 year old white female nursing home patient admitted 1 week ago after she had fallen out of bed and  initially was diagnosed with a left femur fracture/displaced comminuted which is being managed medically.  #2 increasing ecchymosis and edema in the right lower extremity and complaints of pain.  Films today now show a right acute fracture of the proximal tibia and fibula, prior right knee replacement and chronic fracture of the distal femur on the right.  #3 acute abdominal distention over the past 24 hours-abdominal films show a dilated loop of colon concerning for possible sigmoid volvulus Pt  is asymptomatic, her abdomen is distended somewhat tympanitic but totally nontender  #3 history of prior CVA #4.  History of atrial fibrillation #5.  Dementia #6.  Congestive heart failure #7.  Chronic respiratory failure on chronic nasal O2 3 L #8.  Adult onset diabetes mellitus #9.  Morbid obesity #10.  Hypertension #11.  History of uterine cancer #12.  Chronic iron deficiency anemia-Likely secondary to previously documented large hiatal hernia and Cameron erosions from EGD 2020 Felt to be an endoscopic candidate with recent outpatient GI evaluation.  #13 anemia acute and chronic-has required blood transfusion this admission related to fractures  Plan;  Proceed with stat CT of the abdomen and pelvis N.p.o. except meds and ice chips Try to minimize narcotics, realize this will be difficult as she has bilateral lower extremity fractures  Await CT results, may need rectal tube placed If she does have a sigmoid volvulus, will plan for sigmoidoscopy with decompression in a.m. unless she has change in clinical status as she is currently asymptomatic. (currently on DVT prophylaxis with Lovenox)  GI will follow.   [name] Esterwood PA-C 08/26/2022, 5:11 PM     Attending physician's note   I have taken history, reviewed the chart and examined the patient. I performed a substantive portion of this encounter, including complete performance of at least one of the key components, in conjunction with  the APP. I agree with the Advanced Practitioner's note, impression and recommendations.   87yrold NH with dementia, morbid obesity, chronic resp failure on O2, dCHF, IDA d/t large HH  Adm d/t L femur # after fall s/p ORIF 11/25. Also has R Fib/tib # (being eval by ortho)  With abdo distention. No pain. X-ray KUB with ?sig volvulus. Pt having Bms and is asymptomatic. Abdo is non-tender. Rectal exam with soft stools. No impaction.  Patient is also receiving morphine for pain.  Ogilvie's syndrome vs sigmoid volvulus vs ileus.  No signs of ischemia.  WBC count trending down.  Plan: -Proceed with CT Abdo/pelvis as ordered.  Patient is currently drinking oral contrast without any N/V -May need rectal tube after CT. -If needed, endoscopic decompression in a.m. -Keep K> 4,Mg>2 -Will follow along.   RCarmell Austria MD LVelora HecklerGI [phone]

## 2022-08-26 NOTE — Progress Notes (Signed)
[facility] Progress Note Patient: Melinda Kerr [id number]:[id number] DOB: 1935/03/27 DOA: 08/20/2022  DOS: the patient was seen and examined on 08/26/2022  Brief hospital course: Ms. Melinda Kerr is an 87 yo female with PMH afib, dementia, obesity, chronic dCHF, CVA, DMII who presented after a fall out of the bed. She resides at [facility]. She was brought to the ER after her fall.  X-rays and CT head obtained on admission.  Imaging shows left comminuted displaced distal femoral fracture.  She was admitted for evaluation with orthopedic surgery.  She underwent ORIF on 08/22/2022. On 11/29 found to have a possible sigmoid volvulus as well as a right tibia-fibula fracture.  GI consulted.  Orthopedic was reconsulted. Assessment and Plan: Displaced comminuted fracture of shaft of left femur, initial encounter for closed fracture ([place]) s/p fall from bed; likely some contribution from her dementia xray shows left comminuted displaced distal femoral fracture evaluated by ortho and underwent ORIF on 08/22/22 continue pain control  follow up PT/OT evals post op  Right tibia-fibula fracture. Appears to be acute.  Related to original fall. Orthopedic reconsulted. We will monitor.  Acute sigmoid volvulus. On exam on 11/29 appears to have tympanic abdomen. X-ray abdomen is concerning for sigmoid volvulus. GI consulted. A stat CT abdomen ordered. Currently NPO except medication.  May require decompression with NG tube if starts having nausea and vomiting.   Paroxysmal A-fib  Current episode most likely in relation to acute stress and pain. Not on any anticoagulation.  Recommend to hold anticoagulation. Continue Lopressor for rate control. Echocardiogram shows preserved EF, no wall motion abnormality. TSH normal, 1.007   Chronic diastolic CHF (congestive heart failure) (HCC) Appears to be mildly volume overloaded although in the setting of n.p.o. status currently holding off on diuresis.  Acute  urinary retention Mostly postop. Associated with pain as well. Monitor. Foley catheter removed on 11/27. Continue bladder scan may require Foley reinsertion if fails to void.   Microcytic anemia, acute postop expected blood loss anemia Baseline around 9. Dropped down to 5.8 requiring 4 PRBC transfusion. Mostly associated with hematoma related to the surgery and fall. Thankfully hemoglobin stable despite being on Lovenox.  We will monitor.  Poor vascular access. Currently has a central line.  Patient failed midline, PICC line as well as ultrasound-guided line placement. We will continue central line as patient is critically ill.   DNR (do not resuscitate) Verified DNR orders from SNF order sheet   Type 2 diabetes mellitus without complications ([place]) A4T 3.6% Continue SSI and CBG monitoring, changing from ACHS to every 4 hours due to n.p.o. status.   Obesity (BMI 30-39.9) Body mass index is 37.46 kg/m.  Placing the patient at higher risk of poor outcome.   Bedbound Chronic.   History of CVA (cerebrovascular accident) Chronic. s/p surgery, would be reasonable to continue on asa    Hypothyroidism Stable. Continue synthroid 125 mcg daily TSH normal   OSA on CPAP Stable. Continue CPAP.  Subjective: No nausea no vomiting no fever no chills.  Difficult to obtain history.  No significant events overnight.  Physical Exam: General: in moderate distress;  Cardiovascular: S1 and S2 Present, no Murmur Respiratory: Normal respiratory effort, Bilateral Air entry present, bilateral crackles, no wheezes Abdomen: Bowel Sound present, tympanic, distended, nontender Extremities: Bilateral edema with significant bruising in various stages of healing Neurology: alert and oriented to place and person, no focal deficit exam limited due to pain.  Data Reviewed: I have Reviewed nursing notes, Vitals, and Lab results.  Since last encounter, pertinent lab results CBC and BMP   . I have  ordered test including CBC and BMP  . I have discussed pt's care plan and test results with orthopedics, GI  . I have ordered imaging CT abdomen, x-ray chest, x-ray right knee, x-ray abdomen  .   Disposition: Status is: Inpatient Remains inpatient appropriate because: Need further work-up for acute issues  enoxaparin (LOVENOX) injection 40 mg Start: 08/23/22 1200   Family Communication: Discussed with niece on the phone Level of care: Med-Surg switch to telemetry due to multiple comorbidities especially in the setting of sigmoid volvulus.  Vitals:   08/25/22 2115 08/26/22 0606 08/26/22 0856 08/26/22 1303  BP:  103/66 123/88 129/77  Pulse: 85 83 89 88  Resp:  17  18  Temp:  98 F (36.7 C)  98.2 F (36.8 C)  TempSrc:  Oral    SpO2: 100% 98%  94%  Weight:      Height:         Author: Berle Mull, MD 08/26/2022 6:36 PM  Please look on [email or web address] to find out who is on call.

## 2022-08-27 ENCOUNTER — Inpatient Hospital Stay (HOSPITAL_COMMUNITY): Payer: Medicare Other

## 2022-08-27 DIAGNOSIS — S72352A Displaced comminuted fracture of shaft of left femur, initial encounter for closed fracture: Secondary | ICD-10-CM | POA: Diagnosis not present

## 2022-08-27 LAB — CBC WITH DIFFERENTIAL/PLATELET
Abs Immature Granulocytes: 0.24 10*3/uL — ABNORMAL HIGH (ref 0.00–0.07)
Basophils Absolute: 0.1 10*3/uL (ref 0.0–0.1)
Basophils Relative: 0 %
Eosinophils Absolute: 0 10*3/uL (ref 0.0–0.5)
Eosinophils Relative: 0 %
HCT: 29 % — ABNORMAL LOW (ref 36.0–46.0)
Hemoglobin: 8.7 g/dL — ABNORMAL LOW (ref 12.0–15.0)
Immature Granulocytes: 2 %
Lymphocytes Relative: 10 %
Lymphs Abs: 1.5 10*3/uL (ref 0.7–4.0)
MCH: 26.7 pg (ref 26.0–34.0)
MCHC: 30 g/dL (ref 30.0–36.0)
MCV: 89 fL (ref 80.0–100.0)
Monocytes Absolute: 1.3 10*3/uL — ABNORMAL HIGH (ref 0.1–1.0)
Monocytes Relative: 9 %
Neutro Abs: 11.2 10*3/uL — ABNORMAL HIGH (ref 1.7–7.7)
Neutrophils Relative %: 79 %
Platelets: 296 10*3/uL (ref 150–400)
RBC: 3.26 MIL/uL — ABNORMAL LOW (ref 3.87–5.11)
RDW: 21.4 % — ABNORMAL HIGH (ref 11.5–15.5)
WBC: 14.3 10*3/uL — ABNORMAL HIGH (ref 4.0–10.5)
nRBC: 0 % (ref 0.0–0.2)

## 2022-08-27 LAB — BASIC METABOLIC PANEL
Anion gap: 5 (ref 5–15)
BUN: 17 mg/dL (ref 8–23)
CO2: 32 mmol/L (ref 22–32)
Calcium: 8.8 mg/dL — ABNORMAL LOW (ref 8.9–10.3)
Chloride: 99 mmol/L (ref 98–111)
Creatinine, Ser: 0.51 mg/dL (ref 0.44–1.00)
GFR, Estimated: >60 mL/min (ref >60)
Glucose, Bld: 142 mg/dL — ABNORMAL HIGH (ref 70–99)
Potassium: 3.8 mmol/L (ref 3.5–5.1)
Sodium: 136 mmol/L (ref 135–145)

## 2022-08-27 LAB — PROCALCITONIN: Procalcitonin: <0.1 ng/mL

## 2022-08-27 LAB — MAGNESIUM: Magnesium: 2 mg/dL (ref 1.7–2.4)

## 2022-08-27 MED ORDER — ADULT MULTIVITAMIN W/MINERALS CH
1.0000 | ORAL_TABLET | Freq: Every day | ORAL | Status: DC
  Administered 2022-08-27 – 2022-08-31 (×5): 1 via ORAL
  Filled 2022-08-27 (×5): qty 1

## 2022-08-27 MED ORDER — FUROSEMIDE 10 MG/ML IJ SOLN
20.0000 mg | Freq: Once | INTRAMUSCULAR | Status: AC
  Administered 2022-08-27: 20 mg via INTRAVENOUS
  Filled 2022-08-27: qty 2

## 2022-08-27 MED ORDER — GLUCERNA SHAKE PO LIQD
237.0000 mL | Freq: Two times a day (BID) | ORAL | Status: DC
  Administered 2022-08-27 – 2022-08-31 (×8): 237 mL via ORAL
  Filled 2022-08-27 (×9): qty 237

## 2022-08-27 NOTE — Progress Notes (Signed)
[facility] Progress Note Patient: KENNIDEE HEYNE CBJ:[id number] DOB: [birth year] DOA: 08/20/2022  DOS: the patient was seen and examined on 08/27/2022  Brief hospital course: Ms. Froio is an 87 yo female with PMH afib, dementia, obesity, chronic dCHF, CVA, DMII who presented after a fall out of the bed. She resides at [facility]. She was brought to the ER after her fall.  X-rays and CT head obtained on admission.  Imaging shows left comminuted displaced distal femoral fracture.  She was admitted for evaluation with orthopedic surgery.  She underwent ORIF on 08/22/2022. On 11/29 found to have a possible sigmoid volvulus as well as a right tibia-fibula fracture.  GI consulted.  Orthopedic was reconsulted. Assessment and Plan: Displaced comminuted fracture of shaft of left femur, initial encounter for closed fracture ([place]) s/p fall from bed; likely some contribution from her dementia xray shows left comminuted displaced distal femoral fracture evaluated by ortho and underwent ORIF on 08/22/22. X-ray on 11/29 shows right tibia-fibula fracture.  X-ray on 11/30 shows possible left tibia fracture as well as left fourth proximal phalangeal fracture. Management per orthopedics, currently nonconservative therapy. continue pain control  follow up PT/OT evals post op  Severe constipation Concern for sigmoid volvulus currently resolved. On exam on 11/29 appears to have tympanic abdomen. X-ray abdomen is concerning for sigmoid volvulus. GI consulted.  CT abdomen shows no evidence of sigmoid volvulus but shows severe constipation. Also shows stercoral colitis. Treated with smog enema.  GI currently signed off.   Paroxysmal A-fib  Current episode most likely in relation to acute stress and pain. Not on any anticoagulation.  Recommend to hold anticoagulation. Continue Lopressor for rate control. Echocardiogram shows preserved EF, no wall motion abnormality. TSH normal, 1.007   Acute on chronic  diastolic CHF (congestive heart failure) (HCC) Initiating IV Lasix.  Acute urinary retention Foley catheter was inserted which was removed on 11/27. Continue bladder scan may require Foley reinsertion if fails to void.   Microcytic anemia, acute postop expected blood loss anemia Baseline around 9. Hb dropped down to 5.8 requiring 4 PRBC transfusion. Mostly associated with hematoma related to the surgery and fall. Thankfully hemoglobin stable despite being on Lovenox.  We will monitor.  Poor vascular access. Currently has a central line.  Patient failed midline, PICC line as well as ultrasound-guided line placement. We will continue central line as patient is critically ill.   DNR (do not resuscitate) Verified DNR orders from SNF order sheet   Type 2 diabetes mellitus without complications ([place]) H6W 7.3% continue every 4 hours sliding scale for today.  Switch to ACHS tomorrow as long as no other issues.   Obesity (BMI 30-39.9) Body mass index is 37.46 kg/m.  Placing the patient at higher risk of poor outcome.   Bedbound Chronic.   History of CVA (cerebrovascular accident) Chronic. s/p surgery, would be reasonable to continue on asa    Hypothyroidism Stable. Continue synthroid 125 mcg daily TSH normal   OSA on CPAP Stable. Continue CPAP.  Subjective: No nausea Waiting.  Abdomen still distended.  No blood in the stool.  No abdominal pain.  Pain in the leg still present.  Physical Exam: General: Appear in moderate distress; Cardiovascular: S1 and S2 Present, no Murmur, Respiratory: increased respiratory effort, Bilateral Air entry present, bilateral  Crackles, no wheezes Abdomen: Bowel Sound present, Non tender, distended Extremities: bilateral  Pedal edema Neurology: alert and oriented to self   Data Reviewed: I have Reviewed nursing notes, Vitals, and Lab results.  Since last encounter, pertinent lab results CBC and BMP   . I have ordered test including CBC and BMP   .   Discussed patient's plan with GI.  Disposition: Status is: Inpatient Remains inpatient appropriate because: Needs stability of GI issues  enoxaparin (LOVENOX) injection 40 mg Start: 08/23/22 1200   Family Communication: Discussed with niece on the phone on 11/29.  Unable to reach on 11/30. Level of care: Telemetry continue telemetry.  Switch to MedSurg tomorrow as long as no issues.  Vitals:   08/27/22 0702 08/27/22 1003 08/27/22 1010 08/27/22 1323  BP: 139/76 125/70  123/86  Pulse: (!) 102 92  (!) 55  Resp: '16 18  18  '$ Temp: 97.9 F (36.6 C) 97.6 F (36.4 C)  97.7 F (36.5 C)  TempSrc: Oral   Oral  SpO2: 91% (!) 82% 91% 91%  Weight:      Height:         Author: Berle Mull, MD 08/27/2022 6:37 PM  Please look on [email or web address] to find out who is on call.

## 2022-08-27 NOTE — Progress Notes (Signed)
Nutrition Follow-up  DOCUMENTATION CODES:   Obesity unspecified  INTERVENTION:  - Full Liquid diet per MD. Advance to Regular once medically appropriate.  - Glucerna Shake po BID, each supplement provides 220 kcal and 10 grams of protein. - Encourage intake as tolerated. - Daily multivitamin to support micronutrient needs. - Monitor weights.    NUTRITION DIAGNOSIS:   Increased nutrient needs related to hip fracture as evidenced by estimated needs. *ongoing  GOAL:   Patient will meet greater than or equal to 90% of their needs *not being met, supplements added  MONITOR:   PO intake, Supplement acceptance  REASON FOR ASSESSMENT:   Consult Hip fracture protocol  ASSESSMENT:   87 y.o. female admits related fall at SNF. PMH includes: heart failure, CVA, T2DM, bed bound. Pt is currently receiving medical management for displaced comminuted fracture of shaft of left femur.  11/23 admit for femur fx 11/25 ORIR femur fx; SLP eval-Reg diet 11/29 noted to have distended abdomen, found to have possible sigmoid volvulus, diet downgraded to clear liquids 11/30 full liquid diet  Patient reports UBW of "200 something pounds", unsure of exact usual weight but denied recent changes in weight. Could not state what she typically ate PTA, noted to be sleepy and occasionally nodded off during conversation.   Patient reports her abdomen feels better today and that she feels less distended. Notes she did not have a breakfast but feels she could eat a lunch. Current appetite is good. Patient reports she had tried Glucerna at beginning of admission but had not received it (order discontinued). Agreeable to receive again to support intake.   Medications reviewed and include: Synthroid, Miralax  Labs reviewed:  HA1C 7.5 Blood Glucose 162-236 x24 hours   NUTRITION - FOCUSED PHYSICAL EXAM:  Flowsheet Row Most Recent Value  Orbital Region No depletion  Upper Arm Region No depletion   Thoracic and Lumbar Region No depletion  Buccal Region No depletion  Temple Region Mild depletion  Clavicle Bone Region No depletion  Clavicle and Acromion Bone Region No depletion  Scapular Bone Region Unable to assess  Dorsal Hand No depletion  Patellar Region No depletion  Anterior Thigh Region No depletion  Posterior Calf Region No depletion  Edema (RD Assessment) Moderate  Hair Reviewed  Eyes Reviewed  Mouth Reviewed  Skin Reviewed  Nails Reviewed       Diet Order:   Diet Order             Diet full liquid Room service appropriate? Yes; Fluid consistency: Thin  Diet effective now                   EDUCATION NEEDS:  Not appropriate for education at this time  Skin:  Skin Assessment: Reviewed RN Assessment  Last BM:  11/28  Height:  Ht Readings from Last 1 Encounters:  08/20/22 _0  (1.651 m)   Weight:  Wt Readings from Last 1 Encounters:  08/20/22 102.1 kg   Ideal Body Weight:  56.8 kg  BMI:  Body mass index is 37.46 kg/m.  Estimated Nutritional Needs:  Kcal:  2951-8841 kcals Protein:  100-125 gm Fluid:  >/= 2L   Samson Frederic RD, LDN For contact information, refer to [facility].

## 2022-08-27 NOTE — Progress Notes (Addendum)
Melinda Kerr  CC:  Abdominal distention, possible sigmoid volvulus   Subjective: She denies having any abdominal pain.  No nausea or vomiting.  No chest pain or shortness of breath.  She passed a large amount of greenish mushy stool and diarrhea as reported by her nurse and medical assistant.  No bloody stools or melena.  She has not yet received the smog enema as ordered.  No family at the bedside.   Objective:  Vital signs in last 24 hours: Temp:  [97.5 F (36.4 C)-98.2 F (36.8 C)] 97.9 F (36.6 C) (11/30 0702) Pulse Rate:  [88-102] 102 (11/30 0702) Resp:  [16-20] 16 (11/30 0702) BP: (89-139)/(57-77) 139/76 (11/30 0702) SpO2:  [91 %-100 %] 91 % (11/30 0702) Last BM Date : 08/25/22 General: Alert fatigued appearing 87 year old female in no acute distress. Heart: Regular rate and rhythm, no murmurs. Pulm: Breath sounds clear, decreased in the bases. Abdomen: Obese abdomen, upper abdominal distention without tenderness.  Hypoactive bowel sounds to all 4 quadrants. Extremities: Bilateral lower extremities with edema and scattered patches of ecchymosis. Neurologic:  Alert and  oriented x 4. Grossly normal neurologically. Psych:  Alert and cooperative. Normal mood and affect.  Intake/Output from previous day: 11/29 0701 - 11/30 0700 In: 1010 [P.O.:980; I.V.:30] Out: 725 [Urine:725] Intake/Output this shift: Total I/O In: -  Out: 200 [Urine:200]  Lab Results: Recent Labs    08/25/22 0444 08/26/22 0329 08/27/22 0340  WBC 17.1* 14.0* 14.3*  HGB 9.0* 8.8* 8.7*  HCT 29.2* 29.4* 29.0*  PLT 216 268 296   BMET Recent Labs    08/25/22 0444 08/26/22 0329 08/27/22 0340  NA 137 136 136  K 3.8 3.6 3.8  CL 99 100 99  CO2 30 31 32  GLUCOSE 185* 193* 142*  BUN '22 21 17  '$ CREATININE 0.57 0.60 0.51  CALCIUM 9.0 8.9 8.8*   LFT No results for input(s): "PROT", "ALBUMIN", "AST", "ALT", "ALKPHOS", "BILITOT", "BILIDIR", "IBILI" in the last 72  hours. PT/INR No results for input(s): "LABPROT", "INR" in the last 72 hours. Hepatitis Panel No results for input(s): "HEPBSAG", "HCVAB", "HEPAIGM", "HEPBIGM" in the last 72 hours.  DG Tibia/Fibula Left Port  Result Date: 08/27/2022 CLINICAL DATA:  Left leg pain and swelling after fall EXAM: PORTABLE LEFT TIBIA AND FIBULA - 2 VIEW COMPARISON:  Left femur x-ray FINDINGS: Postsurgical changes from left total knee arthroplasty. There is cortical irregularity along the posterior cortex of the proximal tibia seen on lateral view suspicious for fracture. Bones are diffusely demineralized. Generalized soft tissue swelling. IMPRESSION: Cortical irregularity along the posterior cortex of the proximal tibia seen on lateral view suspicious for fracture. Dedicated radiographic knee series is suggested to better evaluate. Electronically Signed   By: Davina Poke D.O.   On: 08/27/2022 08:19   DG Foot 2 Views Left  Result Date: 08/27/2022 CLINICAL DATA:  Left foot pain and swelling after recent fall. EXAM: LEFT FOOT - 2 VIEW COMPARISON:  None Available. FINDINGS: Diffuse osteopenia is noted. Mild dorsal soft tissue swelling is noted. Possible mildly displaced fracture involving the fourth proximal phalanx. IMPRESSION: Possible mildly displaced fourth proximal phalangeal fracture. Diffuse osteopenia. Electronically Signed   By: Marijo Conception M.D.   On: 08/27/2022 08:17   VAS [place] LOWER EXTREMITY VENOUS (DVT)  Result Date: 08/26/2022  Lower Venous DVT Study Patient Name:  Melinda Kerr  Date of Exam:   08/26/2022 Medical Rec #: [id number]  Accession #:    [id number] Date of Birth: Nov 10, 1934         Patient Gender: F Patient Age:   25 years Exam Location:  [facility] Procedure:      VAS [place] LOWER EXTREMITY VENOUS (DVT) Referring Phys: PRANAV PATEL --------------------------------------------------------------------------------  Indications: Edema.  Risk Factors: Surgery 08/22/2022 - Left OPEN  REDUCTION INTERNAL FIXATION (ORIF) DISTAL FEMUR FRACTURE. Limitations: Body habitus, poor ultrasound/tissue interface and patient positioning, patient immobility, patient pain tolerance. Comparison Study: No prior studies. Performing Technologist: Oliver Hum RVT  Examination Guidelines: A complete evaluation includes B-mode imaging, spectral Doppler, color Doppler, and power Doppler as needed of all accessible portions of each vessel. Bilateral testing is considered an integral part of a complete examination. Limited examinations for reoccurring indications may be performed as noted. The reflux portion of the exam is performed with the patient in reverse Trendelenburg.  +---------+---------------+---------+-----------+----------+-------------------+ RIGHT    CompressibilityPhasicitySpontaneityPropertiesThrombus Aging      +---------+---------------+---------+-----------+----------+-------------------+ CFV      Full           Yes      Yes                                      +---------+---------------+---------+-----------+----------+-------------------+ SFJ      Full                                                             +---------+---------------+---------+-----------+----------+-------------------+ FV Prox  Full                                                             +---------+---------------+---------+-----------+----------+-------------------+ FV Mid                  Yes      Yes                                      +---------+---------------+---------+-----------+----------+-------------------+ FV Distal               Yes      Yes                                      +---------+---------------+---------+-----------+----------+-------------------+ PFV      Full                                                             +---------+---------------+---------+-----------+----------+-------------------+ POP      Full           Yes      Yes                                       +---------+---------------+---------+-----------+----------+-------------------+  PTV      Full                                                             +---------+---------------+---------+-----------+----------+-------------------+ PERO                                                  Not well visualized +---------+---------------+---------+-----------+----------+-------------------+   +---------+---------------+---------+-----------+----------+-------------------+ LEFT     CompressibilityPhasicitySpontaneityPropertiesThrombus Aging      +---------+---------------+---------+-----------+----------+-------------------+ CFV      Full           Yes      Yes                                      +---------+---------------+---------+-----------+----------+-------------------+ SFJ      Full                                                             +---------+---------------+---------+-----------+----------+-------------------+ FV Prox  Full                                                             +---------+---------------+---------+-----------+----------+-------------------+ FV Mid   Full                                                             +---------+---------------+---------+-----------+----------+-------------------+ FV DistalFull                                                             +---------+---------------+---------+-----------+----------+-------------------+ PFV      Full                                                             +---------+---------------+---------+-----------+----------+-------------------+ POP      Full           Yes      Yes                                      +---------+---------------+---------+-----------+----------+-------------------+ PTV  Not well visualized  +---------+---------------+---------+-----------+----------+-------------------+ PERO                                                  Not well visualized +---------+---------------+---------+-----------+----------+-------------------+     Summary: RIGHT: - There is no evidence of deep vein thrombosis in the lower extremity. However, portions of this examination were limited- see technologist comments above.  - No cystic structure found in the popliteal fossa.  LEFT: - There is no evidence of deep vein thrombosis in the lower extremity. However, portions of this examination were limited- see technologist comments above.  - No cystic structure found in the popliteal fossa.  *See table(s) above for measurements and observations. Electronically signed by Harold Barban MD on 08/26/2022 at 7:43:24 PM.    Final    CT ABDOMEN PELVIS W CONTRAST  Result Date: 08/26/2022 CLINICAL DATA:  Suspected bowel obstruction. EXAM: CT ABDOMEN AND PELVIS WITH CONTRAST TECHNIQUE: Multidetector CT imaging of the abdomen and pelvis was performed using the standard protocol following bolus administration of intravenous contrast. RADIATION DOSE REDUCTION: This exam was performed according to the departmental dose-optimization program which includes automated exposure control, adjustment of the mA and/or kV according to patient size and/or use of iterative reconstruction technique. CONTRAST:  153m OMNIPAQUE IOHEXOL 300 MG/ML  SOLN COMPARISON:  October 21, 2017 FINDINGS: Lower chest: Mild to moderate severity consolidation is seen within the posterior aspect of the bilateral lung bases. Small bilateral pleural effusions are also seen. A large gastric hernia is seen along the posteromedial aspect of the right lung base. Hepatobiliary: No focal liver abnormality is seen. Status post cholecystectomy. No biliary dilatation. Pancreas: Unremarkable. No pancreatic ductal dilatation or surrounding inflammatory changes. Spleen: Normal in  size without focal abnormality. Adrenals/Urinary Tract: Adrenal glands are unremarkable. Kidneys are normal, without obstructing renal calculi, focal lesion, or hydronephrosis. A 2 mm nonobstructing renal calculi are seen within the left kidney. Bladder is unremarkable. Stomach/Bowel: There is a large gastric hernia. The appendix is surgically absent. Stool is seen throughout the large bowel. This is most prominent within the mid to distal sigmoid colon and rectum. Moderate severity presacral inflammatory fat stranding is noted. No evidence of bowel dilatation. Vascular/Lymphatic: Aortic atherosclerosis. No enlarged abdominal or pelvic lymph nodes. Reproductive: Status post hysterectomy. The bilateral adnexa are limited in evaluation secondary to overlying streak artifact. Other: Bilateral, marked severity subcutaneous inflammatory fat stranding and edema are seen along the lateral aspects of the abdominal and pelvic walls. No abdominopelvic ascites. Musculoskeletal: A total left hip replacement is seen. A metallic density intramedullary rod and compression screw device are also seen within the proximal right femur. An extensive amount of streak artifact is noted with subsequently limited evaluation of the adjacent osseous and soft tissue structures. Marked severity multilevel degenerative changes are seen throughout the lumbar spine. IMPRESSION: 1. Mild to moderate severity posterior bibasilar consolidation with small bilateral pleural effusions. 2. Large gastric hernia. 3. 2 mm nonobstructing left renal calculi. 4. Large stool burden, most prominent within the mid to distal sigmoid colon and rectum. 5. Moderate severity presacral inflammatory fat stranding which may represent sequelae associated with stercoral colitis. 6. Total left hip replacement. 7. Postoperative changes involving the proximal right femur. 8. Aortic atherosclerosis. Aortic Atherosclerosis (ICD10-I70.0). Electronically Signed   By: TVirgina NorfolkM.D.   On: 08/26/2022 19:37   DG  Abd Portable 1V  Result Date: 08/26/2022 CLINICAL DATA:  Tympanic abdomen.  Fall. EXAM: PORTABLE ABDOMEN - 1 VIEW COMPARISON:  CT abdomen pelvis 10/21/2017 FINDINGS: Distended loop of colon in the mid abdomen. Mild amount of stool in gas in the remainder of the colon. Gas in nondilated small bowel loops. Surgical clips right upper quadrant.  Lumbar scoliosis Left hip replacement. Superior migration of the prosthesis in the acetabulum is unchanged. Chronic fracture right intertrochanteric femur with screw and rod fusion. No acute fracture. IMPRESSION: Distended loop of colon in the mid abdomen. This could represent a sigmoid volvulus. CT abdomen pelvis recommended. Electronically Signed   By: Franchot Gallo M.D.   On: 08/26/2022 15:47   DG Knee Right Port  Result Date: 08/26/2022 CLINICAL DATA:  Fall EXAM: PORTABLE RIGHT KNEE - 2 VIEW COMPARISON:  05/09/2020 FINDINGS: Right knee replacement. Chronic fracture distal femur with intramedullary rod and lateral plate and screws in good position. Acute fracture proximal tibia and fibula with mild displacement. Tibial fracture is just below the prosthesis. IMPRESSION: 1. Acute fracture proximal tibia and fibula. 2. Right knee replacement. Chronic fracture distal femur. Electronically Signed   By: Franchot Gallo M.D.   On: 08/26/2022 15:44   DG CHEST PORT 1 VIEW  Result Date: 08/26/2022 CLINICAL DATA:  Short of breath EXAM: PORTABLE CHEST 1 VIEW COMPARISON:  Chest 08/22/2022 FINDINGS: Right jugular catheter tip overlying the right lung apex unchanged. No pneumothorax Cardiac enlargement with mild vascular congestion. Negative for edema. Mild bibasilar airspace disease most compatible with atelectasis Right shoulder replacement without acute abnormality. IMPRESSION: 1. Cardiac enlargement with mild vascular congestion. 2. Mild bibasilar atelectasis. Electronically Signed   By: Franchot Gallo M.D.   On: 08/26/2022 15:42     Assessment / Plan:  43) 87 year old white female nursing home patient admitted 1 week ago after she had fallen out of bed and initially was diagnosed with a left femur fracture/displaced comminuted which is being managed medically.  2) Acute abdominal distention. Abdominal xray showed a dilated loop of colon concerning for possible sigmoid volvulus. CTAP 08/26/2022 showed large stool burden prominent within the mid to distal sigmoid colon and rectum with presacral inflammatory fat stranding suggestive of sterile coral colitis.  No evidence of obstruction or volvulus.  Patient passed a large amount of mushy and watery green stool this morning.  No rectal bleeding or melena.  Smog enema ordered, not yet administered. -Smog enema ASAP -Abdominal x-ray in a.m. -No plans for endoscopic evaluation at this time -Limit narcotic use if possible in setting of left femur fracture -Await further recommendations per Dr. Lyndel Safe  3) Acute on chronic anemia. Required blood transfusion this admission related to fractures. History of IDA, likely due to large cameron lesion and hiatal hernia from EGD in 2020. Hg 8.8 -> 8.7. -CBC in am   4) Increasing ecchymosis and edema in the right lower extremity and complaints of pain.  Films showed a right acute fracture of the proximal tibia and fibula, prior right knee replacement and chronic fracture of the distal femur on the right.  5) Leukocytosis, WBC 17.1 -> 14.3.  No evidence of an infectious process to the abdomen/pelvic per CT.  6) History of GERD -Continue Pantoprazole 40 mg p.o. twice daily   7) History of atrial fibrillation  8) Congestive heart failure  9) Chronic respiratory failure on chronic nasal O2 3 L. CTAP 08/27/2022 identified mild to moderate bibasilar consolidation with small bilateral pleural effusions.  Principal Problem:   Displaced comminuted fracture of shaft of left femur, initial encounter for closed fracture  ([place]) Active Problems:   OSA on CPAP   Hypothyroidism   Microcytic anemia   History of CVA (cerebrovascular accident)   Chronic diastolic CHF (congestive heart failure) ([facility])   Bedbound   Obesity (BMI 30-39.9)   A-fib ([place])   Type 2 diabetes mellitus without complications ([place])   DNR (do not resuscitate)   Acute urinary retention   Fall at nursing home   Closed fracture of distal end of left femur, initial encounter ([place])   Normocytic anemia     LOS: 7 days   Noralyn Pick  08/27/2022, 9:47 AM   Attending physician's Kerr   I have taken history, reviewed the chart and examined the patient. I performed a substantive portion of this encounter, including complete performance of at least one of the key components, in conjunction with the APP. I agree with the Advanced Practitioner's Kerr, impression and recommendations.   CT AP with significant stool throughout the colon, most prominently in the sigmoid colon and rectum.  No volvulus.  No ileus.  Plan: -Continue MiraLAX -Smog enemas. -Once better, would need long-term bowel regimen (MiraLAX QD would be appropriate) -No plans for any endoscopic intervention. -Will sign off for now. -D/W Dr Posey Pronto   Carmell Austria, MD [facility] [phone]

## 2022-08-27 NOTE — Progress Notes (Signed)
Subjective: 5 Days Post-Op s/p Procedure(s): OPEN REDUCTION INTERNAL FIXATION (ORIF) DISTAL FEMUR FRACTURE  Patient is lying comfortably in bed.  Discussed with both medicine and nursing team who have noticed her having increasing pain, swelling, and bruising around the right knee.  No reports of any new injuries or falls.  Nursing notes that with any kind of hygiene care and movement of her lower extremities she has severe pain but at rest she is comfortable.  New x-rays x-rays obtained yesterday of the right knee unfortunately demonstrated a periprosthetic proximal tibia fracture.  Doppler ultrasounds also performed lower extremities show no evidence of new DVT.    Objective:  PE: VITALS:   Vitals:   08/26/22 0856 08/26/22 1303 08/26/22 1923 08/26/22 2018  BP: 123/88 129/77 (!) 89/57 111/68  Pulse: 89 88 100 89  Resp:  '18 18 20  '$ Temp:  98.2 F (36.8 C) (!) 97.5 F (36.4 C) 97.8 F (36.6 C)  TempSrc:   Oral Oral  SpO2:  94% 95% 100%  Weight:      Height:       General: laying in bed in no acute distress GI: soft, nontender abdomen MSK: Significant swelling and bruising around the right lower extremity.  Tender about the proximal tibia and right knee.  On the left side she has dressings clean dry and intact along the left thigh with mild strikethrough on the proximal dressing.  Nontender about the left thigh and left knee.  She does have significant swelling and bruising distally in the tibia, ankle, and foot, and she is diffusely tender in this area.   LABS  Results for orders placed or performed during the hospital encounter of 08/20/22 (from the past 24 hour(s))  Glucose, capillary     Status: Abnormal   Collection Time: 08/26/22  8:27 AM  Result Value Ref Range   Glucose-Capillary 162 (H) 70 - 99 mg/dL  Glucose, capillary     Status: Abnormal   Collection Time: 08/26/22 12:24 PM  Result Value Ref Range   Glucose-Capillary 166 (H) 70 - 99 mg/dL  Glucose, capillary      Status: Abnormal   Collection Time: 08/26/22  5:08 PM  Result Value Ref Range   Glucose-Capillary 186 (H) 70 - 99 mg/dL  Basic metabolic panel     Status: Abnormal   Collection Time: 08/27/22  3:40 AM  Result Value Ref Range   Sodium 136 135 - 145 mmol/L   Potassium 3.8 3.5 - 5.1 mmol/L   Chloride 99 98 - 111 mmol/L   CO2 32 22 - 32 mmol/L   Glucose, Bld 142 (H) 70 - 99 mg/dL   BUN 17 8 - 23 mg/dL   Creatinine, Ser 0.51 0.44 - 1.00 mg/dL   Calcium 8.8 (L) 8.9 - 10.3 mg/dL   GFR, Estimated >60 >60 mL/min   Anion gap 5 5 - 15  CBC with Differential/Platelet     Status: Abnormal   Collection Time: 08/27/22  3:40 AM  Result Value Ref Range   WBC 14.3 (H) 4.0 - 10.5 K/uL   RBC 3.26 (L) 3.87 - 5.11 MIL/uL   Hemoglobin 8.7 (L) 12.0 - 15.0 g/dL   HCT 29.0 (L) 36.0 - 46.0 %   MCV 89.0 80.0 - 100.0 fL   MCH 26.7 26.0 - 34.0 pg   MCHC 30.0 30.0 - 36.0 g/dL   RDW 21.4 (H) 11.5 - 15.5 %   Platelets 296 150 - 400 K/uL  nRBC 0.0 0.0 - 0.2 %   Neutrophils Relative % 79 %   Neutro Abs 11.2 (H) 1.7 - 7.7 K/uL   Lymphocytes Relative 10 %   Lymphs Abs 1.5 0.7 - 4.0 K/uL   Monocytes Relative 9 %   Monocytes Absolute 1.3 (H) 0.1 - 1.0 K/uL   Eosinophils Relative 0 %   Eosinophils Absolute 0.0 0.0 - 0.5 K/uL   Basophils Relative 0 %   Basophils Absolute 0.1 0.0 - 0.1 K/uL   Immature Granulocytes 2 %   Abs Immature Granulocytes 0.24 (H) 0.00 - 0.07 K/uL   Polychromasia PRESENT   Magnesium     Status: None   Collection Time: 08/27/22  3:40 AM  Result Value Ref Range   Magnesium 2.0 1.7 - 2.4 mg/dL  Procalcitonin     Status: None   Collection Time: 08/27/22  3:40 AM  Result Value Ref Range   Procalcitonin <0.10 ng/mL    VAS [place] LOWER EXTREMITY VENOUS (DVT)  Result Date: 08/26/2022  Lower Venous DVT Study Patient Name:  FLEUR AUDINO  Date of Exam:   08/26/2022 Medical Rec #: [id number]        Accession #:    [id number] Date of Birth: 15-Jul-1935         Patient Gender: F Patient Age:    87 years Exam Location:  [facility] Procedure:      VAS [place] LOWER EXTREMITY VENOUS (DVT) Referring Phys: PRANAV PATEL --------------------------------------------------------------------------------  Indications: Edema.  Risk Factors: Surgery 08/22/2022 - Left OPEN REDUCTION INTERNAL FIXATION (ORIF) DISTAL FEMUR FRACTURE. Limitations: Body habitus, poor ultrasound/tissue interface and patient positioning, patient immobility, patient pain tolerance. Comparison Study: No prior studies. Performing Technologist: Oliver Hum RVT  Examination Guidelines: A complete evaluation includes B-mode imaging, spectral Doppler, color Doppler, and power Doppler as needed of all accessible portions of each vessel. Bilateral testing is considered an integral part of a complete examination. Limited examinations for reoccurring indications may be performed as noted. The reflux portion of the exam is performed with the patient in reverse Trendelenburg.  +---------+---------------+---------+-----------+----------+-------------------+ RIGHT    CompressibilityPhasicitySpontaneityPropertiesThrombus Aging      +---------+---------------+---------+-----------+----------+-------------------+ CFV      Full           Yes      Yes                                      +---------+---------------+---------+-----------+----------+-------------------+ SFJ      Full                                                             +---------+---------------+---------+-----------+----------+-------------------+ FV Prox  Full                                                             +---------+---------------+---------+-----------+----------+-------------------+ FV Mid                  Yes      Yes                                      +---------+---------------+---------+-----------+----------+-------------------+  FV Distal               Yes      Yes                                       +---------+---------------+---------+-----------+----------+-------------------+ PFV      Full                                                             +---------+---------------+---------+-----------+----------+-------------------+ POP      Full           Yes      Yes                                      +---------+---------------+---------+-----------+----------+-------------------+ PTV      Full                                                             +---------+---------------+---------+-----------+----------+-------------------+ PERO                                                  Not well visualized +---------+---------------+---------+-----------+----------+-------------------+   +---------+---------------+---------+-----------+----------+-------------------+ LEFT     CompressibilityPhasicitySpontaneityPropertiesThrombus Aging      +---------+---------------+---------+-----------+----------+-------------------+ CFV      Full           Yes      Yes                                      +---------+---------------+---------+-----------+----------+-------------------+ SFJ      Full                                                             +---------+---------------+---------+-----------+----------+-------------------+ FV Prox  Full                                                             +---------+---------------+---------+-----------+----------+-------------------+ FV Mid   Full                                                             +---------+---------------+---------+-----------+----------+-------------------+ FV DistalFull                                                             +---------+---------------+---------+-----------+----------+-------------------+  PFV      Full                                                             +---------+---------------+---------+-----------+----------+-------------------+  POP      Full           Yes      Yes                                      +---------+---------------+---------+-----------+----------+-------------------+ PTV                                                   Not well visualized +---------+---------------+---------+-----------+----------+-------------------+ PERO                                                  Not well visualized +---------+---------------+---------+-----------+----------+-------------------+     Summary: RIGHT: - There is no evidence of deep vein thrombosis in the lower extremity. However, portions of this examination were limited- see technologist comments above.  - No cystic structure found in the popliteal fossa.  LEFT: - There is no evidence of deep vein thrombosis in the lower extremity. However, portions of this examination were limited- see technologist comments above.  - No cystic structure found in the popliteal fossa.  *See table(s) above for measurements and observations. Electronically signed by Harold Barban MD on 08/26/2022 at 7:43:24 PM.    Final    CT ABDOMEN PELVIS W CONTRAST  Result Date: 08/26/2022 CLINICAL DATA:  Suspected bowel obstruction. EXAM: CT ABDOMEN AND PELVIS WITH CONTRAST TECHNIQUE: Multidetector CT imaging of the abdomen and pelvis was performed using the standard protocol following bolus administration of intravenous contrast. RADIATION DOSE REDUCTION: This exam was performed according to the departmental dose-optimization program which includes automated exposure control, adjustment of the mA and/or kV according to patient size and/or use of iterative reconstruction technique. CONTRAST:  121m OMNIPAQUE IOHEXOL 300 MG/ML  SOLN COMPARISON:  October 21, 2017 FINDINGS: Lower chest: Mild to moderate severity consolidation is seen within the posterior aspect of the bilateral lung bases. Small bilateral pleural effusions are also seen. A large gastric hernia is seen along the posteromedial  aspect of the right lung base. Hepatobiliary: No focal liver abnormality is seen. Status post cholecystectomy. No biliary dilatation. Pancreas: Unremarkable. No pancreatic ductal dilatation or surrounding inflammatory changes. Spleen: Normal in size without focal abnormality. Adrenals/Urinary Tract: Adrenal glands are unremarkable. Kidneys are normal, without obstructing renal calculi, focal lesion, or hydronephrosis. A 2 mm nonobstructing renal calculi are seen within the left kidney. Bladder is unremarkable. Stomach/Bowel: There is a large gastric hernia. The appendix is surgically absent. Stool is seen throughout the large bowel. This is most prominent within the mid to distal sigmoid colon and rectum. Moderate severity presacral inflammatory fat stranding is noted. No evidence of bowel dilatation. Vascular/Lymphatic: Aortic atherosclerosis. No enlarged abdominal or pelvic lymph nodes. Reproductive: Status post hysterectomy. The bilateral adnexa are  limited in evaluation secondary to overlying streak artifact. Other: Bilateral, marked severity subcutaneous inflammatory fat stranding and edema are seen along the lateral aspects of the abdominal and pelvic walls. No abdominopelvic ascites. Musculoskeletal: A total left hip replacement is seen. A metallic density intramedullary rod and compression screw device are also seen within the proximal right femur. An extensive amount of streak artifact is noted with subsequently limited evaluation of the adjacent osseous and soft tissue structures. Marked severity multilevel degenerative changes are seen throughout the lumbar spine. IMPRESSION: 1. Mild to moderate severity posterior bibasilar consolidation with small bilateral pleural effusions. 2. Large gastric hernia. 3. 2 mm nonobstructing left renal calculi. 4. Large stool burden, most prominent within the mid to distal sigmoid colon and rectum. 5. Moderate severity presacral inflammatory fat stranding which may  represent sequelae associated with stercoral colitis. 6. Total left hip replacement. 7. Postoperative changes involving the proximal right femur. 8. Aortic atherosclerosis. Aortic Atherosclerosis (ICD10-I70.0). Electronically Signed   By: Virgina Norfolk M.D.   On: 08/26/2022 19:37   DG Abd Portable 1V  Result Date: 08/26/2022 CLINICAL DATA:  Tympanic abdomen.  Fall. EXAM: PORTABLE ABDOMEN - 1 VIEW COMPARISON:  CT abdomen pelvis 10/21/2017 FINDINGS: Distended loop of colon in the mid abdomen. Mild amount of stool in gas in the remainder of the colon. Gas in nondilated small bowel loops. Surgical clips right upper quadrant.  Lumbar scoliosis Left hip replacement. Superior migration of the prosthesis in the acetabulum is unchanged. Chronic fracture right intertrochanteric femur with screw and rod fusion. No acute fracture. IMPRESSION: Distended loop of colon in the mid abdomen. This could represent a sigmoid volvulus. CT abdomen pelvis recommended. Electronically Signed   By: Franchot Gallo M.D.   On: 08/26/2022 15:47   DG Knee Right Port  Result Date: 08/26/2022 CLINICAL DATA:  Fall EXAM: PORTABLE RIGHT KNEE - 2 VIEW COMPARISON:  05/09/2020 FINDINGS: Right knee replacement. Chronic fracture distal femur with intramedullary rod and lateral plate and screws in good position. Acute fracture proximal tibia and fibula with mild displacement. Tibial fracture is just below the prosthesis. IMPRESSION: 1. Acute fracture proximal tibia and fibula. 2. Right knee replacement. Chronic fracture distal femur. Electronically Signed   By: Franchot Gallo M.D.   On: 08/26/2022 15:44   DG CHEST PORT 1 VIEW  Result Date: 08/26/2022 CLINICAL DATA:  Short of breath EXAM: PORTABLE CHEST 1 VIEW COMPARISON:  Chest 08/22/2022 FINDINGS: Right jugular catheter tip overlying the right lung apex unchanged. No pneumothorax Cardiac enlargement with mild vascular congestion. Negative for edema. Mild bibasilar airspace disease most  compatible with atelectasis Right shoulder replacement without acute abnormality. IMPRESSION: 1. Cardiac enlargement with mild vascular congestion. 2. Mild bibasilar atelectasis. Electronically Signed   By: Franchot Gallo M.D.   On: 08/26/2022 15:42    Assessment/Plan: Principal Problem:   Displaced comminuted fracture of shaft of left femur, initial encounter for closed fracture (HCC) Active Problems:   OSA on CPAP   Hypothyroidism   Microcytic anemia   History of CVA (cerebrovascular accident)   Chronic diastolic CHF (congestive heart failure) (HCC)   Bedbound   Obesity (BMI 30-39.9)   A-fib ([place])   Type 2 diabetes mellitus without complications ([place])   DNR (do not resuscitate)   Acute urinary retention   Fall at nursing home   Closed fracture of distal end of left femur, initial encounter ([place])   Normocytic anemia   5 Days Post-Op s/p Procedure(s): OPEN REDUCTION INTERNAL FIXATION (ORIF) DISTAL FEMUR  FRACTURE 08/22/2022  Right lower extremity proximal tibia periprosthetic fracture there is severely osteoporotic bone.  Reviewed x-rays which show a mildly impacted and displaced fracture of the right proximal tibia.  Clinically on exam of the right leg appears unstable.  Unclear the timing and etiology of the right knee fracture given the recent increase in pain and bruising to the right knee.  Given the severely osteoporotic bone and proximal nature of the fracture may not be amenable to primary fixation.  Will immobilize her in a knee immobilizer to see if this will help with her pain for hygiene and care.  Will discuss with my trauma colleagues to see if this type of fracture is amenable to any kind of fixation or would recommend nonoperative treatment at this point for the right proximal tibia fracture.  Patient also with increased swelling and bruising of the left tibia, left ankle, and left foot.  This may be secondary to the recent left femur surgery but will obtain new x-rays here to  rule out other injuries.   WB: NWB LLE, nonweightbearing right lower extremity in knee immobilizer Abx: ancef + vanc Imaging: PACU xrays stable, s/p ORIF Dressing: keep intact until follow up, change PRN if soiled or saturated. DVT prophylaxis: lovenox starting POD1 x4 weeks Follow up: 2 weeks after surgery for a wound check with Dr. Zachery Dakins at [facility], MD Orthopaedic Surgery Contact information:   After hours and holidays please check [email or web address] for group call information for [facility] [place] 08/27/2022, 6:05 AM

## 2022-08-28 ENCOUNTER — Inpatient Hospital Stay (HOSPITAL_COMMUNITY): Payer: Medicare Other

## 2022-08-28 DIAGNOSIS — S72352A Displaced comminuted fracture of shaft of left femur, initial encounter for closed fracture: Secondary | ICD-10-CM | POA: Diagnosis not present

## 2022-08-28 LAB — CBC WITH DIFFERENTIAL/PLATELET
Abs Immature Granulocytes: 0.22 10*3/uL — ABNORMAL HIGH (ref 0.00–0.07)
Basophils Absolute: 0.1 10*3/uL (ref 0.0–0.1)
Basophils Relative: 1 %
Eosinophils Absolute: 0 10*3/uL (ref 0.0–0.5)
Eosinophils Relative: 0 %
HCT: 32.3 % — ABNORMAL LOW (ref 36.0–46.0)
Hemoglobin: 9.5 g/dL — ABNORMAL LOW (ref 12.0–15.0)
Immature Granulocytes: 2 %
Lymphocytes Relative: 12 %
Lymphs Abs: 1.6 10*3/uL (ref 0.7–4.0)
MCH: 26.8 pg (ref 26.0–34.0)
MCHC: 29.4 g/dL — ABNORMAL LOW (ref 30.0–36.0)
MCV: 91.2 fL (ref 80.0–100.0)
Monocytes Absolute: 1.1 10*3/uL — ABNORMAL HIGH (ref 0.1–1.0)
Monocytes Relative: 8 %
Neutro Abs: 10.3 10*3/uL — ABNORMAL HIGH (ref 1.7–7.7)
Neutrophils Relative %: 77 %
Platelets: 346 10*3/uL (ref 150–400)
RBC: 3.54 MIL/uL — ABNORMAL LOW (ref 3.87–5.11)
RDW: 22.5 % — ABNORMAL HIGH (ref 11.5–15.5)
WBC: 13.3 10*3/uL — ABNORMAL HIGH (ref 4.0–10.5)
nRBC: 0 % (ref 0.0–0.2)

## 2022-08-28 LAB — BASIC METABOLIC PANEL
Anion gap: 4 — ABNORMAL LOW (ref 5–15)
BUN: 15 mg/dL (ref 8–23)
CO2: 34 mmol/L — ABNORMAL HIGH (ref 22–32)
Calcium: 9.1 mg/dL (ref 8.9–10.3)
Chloride: 100 mmol/L (ref 98–111)
Creatinine, Ser: 0.59 mg/dL (ref 0.44–1.00)
GFR, Estimated: >60 mL/min (ref >60)
Glucose, Bld: 150 mg/dL — ABNORMAL HIGH (ref 70–99)
Potassium: 4.2 mmol/L (ref 3.5–5.1)
Sodium: 138 mmol/L (ref 135–145)

## 2022-08-28 LAB — PROCALCITONIN: Procalcitonin: <0.1 ng/mL

## 2022-08-28 LAB — MAGNESIUM: Magnesium: 2.1 mg/dL (ref 1.7–2.4)

## 2022-08-28 MED ORDER — MAGNESIUM HYDROXIDE 400 MG/5ML PO SUSP
15.0000 mL | Freq: Every day | ORAL | Status: DC
  Administered 2022-08-28: 15 mL via ORAL
  Filled 2022-08-28: qty 30

## 2022-08-28 MED ORDER — DOCUSATE SODIUM 100 MG PO CAPS
100.0000 mg | ORAL_CAPSULE | Freq: Two times a day (BID) | ORAL | Status: DC
  Administered 2022-08-28: 100 mg via ORAL
  Filled 2022-08-28: qty 1

## 2022-08-28 MED ORDER — MORPHINE SULFATE (PF) 2 MG/ML IV SOLN: 2.0000 mg | INTRAVENOUS | Status: DC | PRN

## 2022-08-28 MED ORDER — HYDROCODONE-ACETAMINOPHEN 5-325 MG PO TABS
1.0000 | ORAL_TABLET | Freq: Four times a day (QID) | ORAL | Status: DC | PRN
  Administered 2022-08-28 – 2022-08-31 (×6): 1 via ORAL
  Filled 2022-08-28 (×6): qty 1

## 2022-08-28 MED ORDER — FUROSEMIDE 10 MG/ML IJ SOLN
20.0000 mg | Freq: Once | INTRAMUSCULAR | Status: AC
  Administered 2022-08-28: 20 mg via INTRAVENOUS
  Filled 2022-08-28: qty 2

## 2022-08-28 NOTE — NC FL2 (Signed)
[place] MEDICAID FL2 LEVEL OF CARE FORM     IDENTIFICATION  Patient Name: Melinda Kerr Birthdate: [birth year] Sex: female Admission Date (Current Location): 08/20/2022  [facility] and [place] Number:  Herbalist and Address:  [facility],  [place] [place], [place]      Provider Number: [id number]  Attending Physician Name and Address:  Lavina Hamman, MD  Relative Name and Phone Number:  Kathrynn Humble (niece/POA) Ph: [phone]    Current Level of Care: Hospital Recommended Level of Care: [place] Prior Approval Number:    Date Approved/Denied:   PASRR Number: [id number]  Discharge Plan: SNF    Current Diagnoses: Patient Active Problem List   Diagnosis Date Noted   Fall at nursing home 08/21/2022   Closed fracture of distal end of left femur, initial encounter ([place]) 08/21/2022   Normocytic anemia 08/21/2022   Displaced comminuted fracture of shaft of left femur, initial encounter for closed fracture ([place]) 08/20/2022   Bedbound 08/20/2022   Obesity (BMI 30-39.9) 08/20/2022   A-fib ([place]) 08/20/2022   Type 2 diabetes mellitus without complications ([place]) 37/16/9678   DNR (do not resuscitate) 08/20/2022   Acute urinary retention 08/20/2022   Chronic diastolic CHF (congestive heart failure) ([place]) 03/06/2021   Prolonged QT interval 02/04/2021   History of CVA (cerebrovascular accident) 02/04/2021   Closed fracture of right distal femur ([place]) 05/08/2020   Anemia associated with acute blood loss 11/13/2018   Closed fracture of right proximal humerus 10/26/2018   S/P reverse total shoulder arthroplasty, right 10/26/2018   Diverticulitis of large intestine with abscess 07/13/2017   Aortic atherosclerosis ([place]) 09/12/2016   Coronary artery calcification 09/12/2016   Hiatal hernia 09/12/2016   Lumbar disc disease 09/12/2016   Chemotherapy-induced thrombocytopenia 08/04/2016   Chemotherapy induced neutropenia ([place]) 05/26/2016    Port catheter in place 05/26/2016   Encounter for antineoplastic chemotherapy 05/26/2016   Urinary frequency 04/24/2016   Obstructive sleep apnea on CPAP 04/24/2016   History of DVT (deep vein thrombosis) 04/24/2016   Endometrial cancer, FIGO stage IIIC ([place]) 04/24/2016   Malignant neoplasm of endometrium ([place]) 03/26/2016   Dyspnea on exertion 03/19/2016   Malignant neoplasm of uterus ([place]) 03/19/2016   Status post right knee replacement 04/16/2014   Essential hypertension 04/16/2014   Postoperative anemia due to acute blood loss 04/12/2014   Status post total left knee replacement 10/14/2013   Hypothyroidism 10/14/2013   UI (urinary incontinence) 10/14/2013   Dyslipidemia 10/14/2013   Constipation 10/14/2013   Gout 10/14/2013   Microcytic anemia 10/14/2013   Allergic rhinitis 10/14/2013   Insomnia 10/14/2013   Depression 10/14/2013   OA (osteoarthritis) of knee 10/09/2013   OSA on CPAP 08/10/2013    Orientation RESPIRATION BLADDER Height & Weight     Self  O2 (2L/min) Incontinent Weight: 225 lb 1.4 oz (102.1 kg) Height:  '5\' 5"'$  (165.1 cm)  BEHAVIORAL SYMPTOMS/MOOD NEUROLOGICAL BOWEL NUTRITION STATUS     (N/A) Incontinent Diet (Soft diet)  AMBULATORY STATUS COMMUNICATION OF NEEDS Skin   Total Care Verbally Other (Comment) (Ecchymosis: bilateral legs; Erythema: bilateral buttocks)                       Personal Care Assistance Level of Assistance  Bathing, Feeding, Dressing Bathing Assistance: Maximum assistance Feeding assistance: Maximum assistance Dressing Assistance: Maximum assistance     Functional Limitations Info  Sight, Hearing, Speech Sight Info: Impaired Hearing Info: Impaired Speech Info: Adequate  SPECIAL CARE FACTORS FREQUENCY                       Contractures Contractures Info: Not present    Additional Factors Info  Code Status, Allergies Code Status Info: DNR Allergies Info: Oxybutynin           Current Medications  (08/28/2022):  This is the current hospital active medication list Current Facility-Administered Medications  Medication Dose Route Frequency Provider Last Rate Last Admin   acetaminophen (TYLENOL) tablet 650 mg  650 mg Oral Q6H PRN Lavina Hamman, MD   650 mg at 08/27/22 5409   Chlorhexidine Gluconate Cloth 2 % PADS 6 each  6 each Topical Daily Dwyane Dee, MD   6 each at 08/27/22 1321   enoxaparin (LOVENOX) injection 40 mg  40 mg Subcutaneous Daily Merlene Pulling K, PA-C   40 mg at 08/28/22 1101   feeding supplement (GLUCERNA SHAKE) (GLUCERNA SHAKE) liquid 237 mL  237 mL Oral BID BM Lavina Hamman, MD   237 mL at 08/28/22 1102   HYDROcodone-acetaminophen (NORCO/VICODIN) 5-325 MG per tablet 1 tablet  1 tablet Oral Q6H PRN Lavina Hamman, MD       insulin glargine-yfgn [facility]) injection 10 Units  10 Units Subcutaneous Daily Lavina Hamman, MD   10 Units at 08/28/22 1101   levothyroxine (SYNTHROID) tablet 125 mcg  125 mcg Oral Q0600 Willaim Sheng, MD   125 mcg at 08/28/22 0516   metoprolol tartrate (LOPRESSOR) tablet 12.5 mg  12.5 mg Oral BID Lavina Hamman, MD   12.5 mg at 08/28/22 1101   morphine (PF) 2 MG/ML injection 2 mg  2 mg Intravenous Q2H PRN Lavina Hamman, MD       multivitamin with minerals tablet 1 tablet  1 tablet Oral Daily Lavina Hamman, MD   1 tablet at 08/28/22 1101   ondansetron (ZOFRAN) injection 4 mg  4 mg Intravenous Q6H PRN Willaim Sheng, MD       pantoprazole (PROTONIX) EC tablet 40 mg  40 mg Oral BID AC Willaim Sheng, MD   40 mg at 08/28/22 1101   polyethylene glycol (MIRALAX / GLYCOLAX) packet 17 g  17 g Oral BID Jackquline Denmark, MD   17 g at 08/28/22 1101   sodium chloride flush (NS) 0.9 % injection 10-40 mL  10-40 mL Intracatheter Q12H Willaim Sheng, MD   10 mL at 08/27/22 2107   sodium chloride flush (NS) 0.9 % injection 10-40 mL  10-40 mL Intracatheter PRN Willaim Sheng, MD   30 mL at 08/24/22 1756     Discharge  Medications: Please see discharge summary for a list of discharge medications.  Relevant Imaging Results:  Relevant Lab Results:   Additional Information SSN: [id number]  Sherie Don, LCSW

## 2022-08-28 NOTE — TOC Progression Note (Signed)
Transition of Care [facility]) - Progression Note   Patient Details  Name: Melinda Kerr MRN: [id number] Date of Birth: 1934/10/24  [facility]) CM/SW [place], LCSW Phone Number: 08/28/2022, 2:04 PM  Clinical Narrative: Patient is expected to be medically ready for discharge back to [facility] over the weekend. CSW confirmed with Linus Orn in admissions that the facility can accept patient back once medically ready. FL2 completed and faxed to facility in hub. TOC to follow.  Expected Discharge Plan: Long Term Nursing Home Barriers to Discharge: Continued Medical Work up  Expected Discharge Plan and Services Expected Discharge Plan: [place] In-house Referral: Clinical Social Work Living arrangements for the past 2 months: [place]              DME Arranged: N/A DME Agency: NA  Social Determinants of Health ([place]) Interventions    Readmission Risk Interventions    08/21/2022    2:17 PM 02/06/2021    2:54 PM 05/10/2020   12:58 PM  Readmission Risk Prevention Plan  Transportation Screening Complete Complete Complete  PCP or Specialist Appt within 5-7 Days Complete Complete Complete  Home Care Screening Complete Complete Complete  Medication Review (RN CM) Complete Complete Complete

## 2022-08-28 NOTE — Progress Notes (Signed)
Orthopedic Tech Progress Note Patient Details:  Melinda Kerr 22-Oct-1934 [id number]  Ortho Devices Type of Ortho Device: Knee Immobilizer Ortho Device/Splint Location: RLE Ortho Device/Splint Interventions: Application   Post Interventions Patient Tolerated: Well Instructions Provided: Adjustment of device  Mickala Laton E Izak Anding 08/28/2022, 10:54 AM

## 2022-08-28 NOTE — Plan of Care (Signed)
  Problem: Education: Goal: Knowledge of General Education information will improve Description: Including pain rating scale, medication(s)/side effects and non-pharmacologic comfort measures Outcome: Progressing   Problem: Clinical Measurements: Goal: Respiratory complications will improve Outcome: Progressing   Problem: Nutrition: Goal: Adequate nutrition will be maintained Outcome: Progressing   

## 2022-08-28 NOTE — Progress Notes (Signed)
[facility] Progress Note Patient: Melinda Kerr LKG:[id number] DOB: December 15, 1934 DOA: 08/20/2022  DOS: the patient was seen and examined on 08/28/2022  Brief hospital course: Ms. Busk is an 87 yo female with PMH afib, dementia, obesity, chronic dCHF, CVA, DMII who presented after a fall out of the bed. She resides at [facility]. She was brought to the ER after her fall.  X-rays and CT head obtained on admission.  Imaging shows left comminuted displaced distal femoral fracture.  She was admitted for evaluation with orthopedic surgery.  She underwent ORIF on 08/22/2022. On 11/29 found to have a possible sigmoid volvulus as well as a right tibia-fibula fracture.  GI consulted.  Orthopedic was reconsulted. Assessment and Plan: Displaced comminuted fracture of shaft of left femur, initial encounter for closed fracture ([place]) s/p fall from bed; likely some contribution from her dementia xray shows left comminuted displaced distal femoral fracture evaluated by ortho and underwent ORIF on 08/22/22. X-ray on 11/29 shows right tibia-fibula fracture.  X-ray on 11/30 shows possible left tibia fracture as well as left fourth proximal phalangeal fracture. Management per orthopedics, currently nonconservative therapy. continue pain control  follow up PT/OT evals post op  Severe constipation Concern for sigmoid volvulus currently resolved. On exam on 11/29 appears to have tympanic abdomen. X-ray abdomen is concerning for sigmoid volvulus. GI consulted.  CT abdomen shows no evidence of sigmoid volvulus but shows severe constipation. Also shows stercoral colitis. Treated with smog enema.  Continue MiraLAX, milk of magnesia and Colace. GI currently signed off.   Paroxysmal A-fib  Current episode most likely in relation to acute stress and pain. Not on any anticoagulation.  Recommend to hold anticoagulation. Continue Lopressor for rate control. Echocardiogram shows preserved EF, no wall motion  abnormality. TSH normal, 1.007   Acute on chronic diastolic CHF (congestive heart failure) (HCC) Continue IV Lasix for now.  Acute urinary retention Foley catheter was inserted which was removed on 11/27. Continue bladder scan may require Foley reinsertion if fails to void.   Microcytic anemia, acute postop expected blood loss anemia Baseline around 9. Hb dropped down to 5.8 requiring 4 PRBC transfusion. Mostly associated with hematoma related to the surgery and fall. Thankfully hemoglobin stable despite being on Lovenox.  We will monitor.  Poor vascular access. Currently has a central line.  Patient failed midline, PICC line as well as ultrasound-guided line placement. We will continue central line as patient is critically ill.   DNR (do not resuscitate) Verified DNR orders from SNF order sheet   Type 2 diabetes mellitus without complications ([place]) G6Y 4.0% continue every 4 hours sliding scale for today.  Switch to ACHS tomorrow as long as no other issues.   Obesity (BMI 30-39.9) Body mass index is 37.46 kg/m.  Placing the patient at higher risk of poor outcome.   Bedbound Chronic.   History of CVA (cerebrovascular accident) Chronic. s/p surgery, would be reasonable to continue on asa    Hypothyroidism Stable. Continue synthroid 125 mcg daily TSH normal   OSA on CPAP Stable. Continue CPAP.  Leukocytosis. Likely related to patient's stress and pain and severe constipation. For now we will monitor.  No evidence of infection.  Procalcitonin negative.  Subjective: No nausea no vomiting.  No abdominal pain.  Distention improving.  Passing gas.  Tolerating oral diet.  Breathing still heavy.  Pain still present.  More in the left leg.  No pain in the right leg.  Currently awaiting knee immobilizer.  Physical Exam: General: Appear  in mild distress; Cardiovascular: S1 and S2 Present, no Murmur, Respiratory: increased respiratory effort, Bilateral Air entry present,  bilateral  Crackles, no wheezes Abdomen: Bowel Sound present, Non tender, somewhat distended Extremities: bilateral Pedal edema Neurology: alert and oriented to time, place, and person  Data Reviewed: I have Reviewed nursing notes, Vitals, and Lab results. Since last encounter, pertinent lab results CBC and BMP   . I have ordered test including CBC and BMP  . I have ordered imaging chest x-ray and x-ray abdomen.  I have independently visualized chest x-ray and x-ray abdomen.  Chest x-ray shows some vascular congestion or pneumonia.  X-ray abdomen shows constipation  .   Disposition: Status is: Inpatient Remains inpatient appropriate because: Need for the diuresis  enoxaparin (LOVENOX) injection 40 mg Start: 08/23/22 1200   Family Communication: Discussed with niece on the phone on 11/29.  Unable to reach on 11/30. Level of care: Telemetry continue telemetry while receiving diuresis  Vitals:   08/27/22 1323 08/27/22 2057 08/28/22 0623 08/28/22 1343  BP: 123/86 118/68 116/75 125/65  Pulse: (!) 55 72 87 100  Resp: '18 18 18 16  '$ Temp: 97.7 F (36.5 C) 98.4 F (36.9 C) 97.9 F (36.6 C) 98.8 F (37.1 C)  TempSrc: Oral Oral Oral   SpO2: 91% 100% 100% 100%  Weight:      Height:         Author: Berle Mull, MD 08/28/2022 6:05 PM  Please look on [email or web address] to find out who is on call.

## 2022-08-28 NOTE — Progress Notes (Signed)
Subjective: 6 Days Post-Op s/p Procedure(s): OPEN REDUCTION INTERNAL FIXATION (ORIF) DISTAL FEMUR FRACTURE Patient is lying comfortably in bed.  Patient still awaiting knee immobilizer for right lower extremity. Endorses pain with movement of lower extremities.   Objective:  PE: VITALS:   Vitals:   08/27/22 1010 08/27/22 1323 08/27/22 2057 08/28/22 0623  BP:  123/86 118/68 116/75  Pulse:  (!) 55 72 87  Resp:  '18 18 18  '$ Temp:  97.7 F (36.5 C) 98.4 F (36.9 C) 97.9 F (36.6 C)  TempSrc:  Oral Oral Oral  SpO2: 91% 91% 100% 100%  Weight:      Height:       General: laying in bed in no acute distress GI: soft, nontender abdomen MSK: Significant swelling and bruising around the right lower extremity.  Tender about the proximal tibia and right knee.  On the left side she has dressings clean dry and intact along the left thigh with mild strikethrough on the proximal dressing.  Nontender about the left thigh and left knee.  She does have significant swelling and bruising distally in the tibia, ankle, and foot, and she is diffusely tender in this area.   LABS  No results found for this or any previous visit (from the past 24 hour(s)).   DG Tibia/Fibula Left Port  Result Date: 08/27/2022 CLINICAL DATA:  Left leg pain and swelling after fall EXAM: PORTABLE LEFT TIBIA AND FIBULA - 2 VIEW COMPARISON:  Left femur x-ray FINDINGS: Postsurgical changes from left total knee arthroplasty. There is cortical irregularity along the posterior cortex of the proximal tibia seen on lateral view suspicious for fracture. Bones are diffusely demineralized. Generalized soft tissue swelling. IMPRESSION: Cortical irregularity along the posterior cortex of the proximal tibia seen on lateral view suspicious for fracture. Dedicated radiographic knee series is suggested to better evaluate. Electronically Signed   By: Davina Poke D.O.   On: 08/27/2022 08:19   DG Foot 2 Views Left  Result Date:  08/27/2022 CLINICAL DATA:  Left foot pain and swelling after recent fall. EXAM: LEFT FOOT - 2 VIEW COMPARISON:  None Available. FINDINGS: Diffuse osteopenia is noted. Mild dorsal soft tissue swelling is noted. Possible mildly displaced fracture involving the fourth proximal phalanx. IMPRESSION: Possible mildly displaced fourth proximal phalangeal fracture. Diffuse osteopenia. Electronically Signed   By: Marijo Conception M.D.   On: 08/27/2022 08:17   VAS [place] LOWER EXTREMITY VENOUS (DVT)  Result Date: 08/26/2022  Lower Venous DVT Study Patient Name:  Melinda Kerr  Date of Exam:   08/26/2022 Medical Rec #: [id number]        Accession #:    [id number] Date of Birth: [birth year]         Patient Gender: F Patient Age:   87 years Exam Location:  [facility] Procedure:      VAS [place] LOWER EXTREMITY VENOUS (DVT) Referring Phys: PRANAV PATEL --------------------------------------------------------------------------------  Indications: Edema.  Risk Factors: Surgery 08/22/2022 - Left OPEN REDUCTION INTERNAL FIXATION (ORIF) DISTAL FEMUR FRACTURE. Limitations: Body habitus, poor ultrasound/tissue interface and patient positioning, patient immobility, patient pain tolerance. Comparison Study: No prior studies. Performing Technologist: Oliver Hum RVT  Examination Guidelines: A complete evaluation includes B-mode imaging, spectral Doppler, color Doppler, and power Doppler as needed of all accessible portions of each vessel. Bilateral testing is considered an integral part of a complete examination. Limited examinations for reoccurring indications may be performed as noted. The reflux portion of the exam is performed with  the patient in reverse Trendelenburg.  +---------+---------------+---------+-----------+----------+-------------------+ RIGHT    CompressibilityPhasicitySpontaneityPropertiesThrombus Aging      +---------+---------------+---------+-----------+----------+-------------------+ CFV      Full            Yes      Yes                                      +---------+---------------+---------+-----------+----------+-------------------+ SFJ      Full                                                             +---------+---------------+---------+-----------+----------+-------------------+ FV Prox  Full                                                             +---------+---------------+---------+-----------+----------+-------------------+ FV Mid                  Yes      Yes                                      +---------+---------------+---------+-----------+----------+-------------------+ FV Distal               Yes      Yes                                      +---------+---------------+---------+-----------+----------+-------------------+ PFV      Full                                                             +---------+---------------+---------+-----------+----------+-------------------+ POP      Full           Yes      Yes                                      +---------+---------------+---------+-----------+----------+-------------------+ PTV      Full                                                             +---------+---------------+---------+-----------+----------+-------------------+ PERO                                                  Not well visualized +---------+---------------+---------+-----------+----------+-------------------+   +---------+---------------+---------+-----------+----------+-------------------+ LEFT  CompressibilityPhasicitySpontaneityPropertiesThrombus Aging      +---------+---------------+---------+-----------+----------+-------------------+ CFV      Full           Yes      Yes                                      +---------+---------------+---------+-----------+----------+-------------------+ SFJ      Full                                                              +---------+---------------+---------+-----------+----------+-------------------+ FV Prox  Full                                                             +---------+---------------+---------+-----------+----------+-------------------+ FV Mid   Full                                                             +---------+---------------+---------+-----------+----------+-------------------+ FV DistalFull                                                             +---------+---------------+---------+-----------+----------+-------------------+ PFV      Full                                                             +---------+---------------+---------+-----------+----------+-------------------+ POP      Full           Yes      Yes                                      +---------+---------------+---------+-----------+----------+-------------------+ PTV                                                   Not well visualized +---------+---------------+---------+-----------+----------+-------------------+ PERO                                                  Not well visualized +---------+---------------+---------+-----------+----------+-------------------+     Summary: RIGHT: - There is no evidence of deep vein thrombosis in the lower extremity. However, portions of this examination were limited- see technologist  comments above.  - No cystic structure found in the popliteal fossa.  LEFT: - There is no evidence of deep vein thrombosis in the lower extremity. However, portions of this examination were limited- see technologist comments above.  - No cystic structure found in the popliteal fossa.  *See table(s) above for measurements and observations. Electronically signed by Harold Barban MD on 08/26/2022 at 7:43:24 PM.    Final    CT ABDOMEN PELVIS W CONTRAST  Result Date: 08/26/2022 CLINICAL DATA:  Suspected bowel obstruction. EXAM: CT ABDOMEN AND PELVIS WITH CONTRAST  TECHNIQUE: Multidetector CT imaging of the abdomen and pelvis was performed using the standard protocol following bolus administration of intravenous contrast. RADIATION DOSE REDUCTION: This exam was performed according to the departmental dose-optimization program which includes automated exposure control, adjustment of the mA and/or kV according to patient size and/or use of iterative reconstruction technique. CONTRAST:  184m OMNIPAQUE IOHEXOL 300 MG/ML  SOLN COMPARISON:  October 21, 2017 FINDINGS: Lower chest: Mild to moderate severity consolidation is seen within the posterior aspect of the bilateral lung bases. Small bilateral pleural effusions are also seen. A large gastric hernia is seen along the posteromedial aspect of the right lung base. Hepatobiliary: No focal liver abnormality is seen. Status post cholecystectomy. No biliary dilatation. Pancreas: Unremarkable. No pancreatic ductal dilatation or surrounding inflammatory changes. Spleen: Normal in size without focal abnormality. Adrenals/Urinary Tract: Adrenal glands are unremarkable. Kidneys are normal, without obstructing renal calculi, focal lesion, or hydronephrosis. A 2 mm nonobstructing renal calculi are seen within the left kidney. Bladder is unremarkable. Stomach/Bowel: There is a large gastric hernia. The appendix is surgically absent. Stool is seen throughout the large bowel. This is most prominent within the mid to distal sigmoid colon and rectum. Moderate severity presacral inflammatory fat stranding is noted. No evidence of bowel dilatation. Vascular/Lymphatic: Aortic atherosclerosis. No enlarged abdominal or pelvic lymph nodes. Reproductive: Status post hysterectomy. The bilateral adnexa are limited in evaluation secondary to overlying streak artifact. Other: Bilateral, marked severity subcutaneous inflammatory fat stranding and edema are seen along the lateral aspects of the abdominal and pelvic walls. No abdominopelvic ascites.  Musculoskeletal: A total left hip replacement is seen. A metallic density intramedullary rod and compression screw device are also seen within the proximal right femur. An extensive amount of streak artifact is noted with subsequently limited evaluation of the adjacent osseous and soft tissue structures. Marked severity multilevel degenerative changes are seen throughout the lumbar spine. IMPRESSION: 1. Mild to moderate severity posterior bibasilar consolidation with small bilateral pleural effusions. 2. Large gastric hernia. 3. 2 mm nonobstructing left renal calculi. 4. Large stool burden, most prominent within the mid to distal sigmoid colon and rectum. 5. Moderate severity presacral inflammatory fat stranding which may represent sequelae associated with stercoral colitis. 6. Total left hip replacement. 7. Postoperative changes involving the proximal right femur. 8. Aortic atherosclerosis. Aortic Atherosclerosis (ICD10-I70.0). Electronically Signed   By: TVirgina NorfolkM.D.   On: 08/26/2022 19:37   DG Abd Portable 1V  Result Date: 08/26/2022 CLINICAL DATA:  Tympanic abdomen.  Fall. EXAM: PORTABLE ABDOMEN - 1 VIEW COMPARISON:  CT abdomen pelvis 10/21/2017 FINDINGS: Distended loop of colon in the mid abdomen. Mild amount of stool in gas in the remainder of the colon. Gas in nondilated small bowel loops. Surgical clips right upper quadrant.  Lumbar scoliosis Left hip replacement. Superior migration of the prosthesis in the acetabulum is unchanged. Chronic fracture right intertrochanteric femur with screw and rod fusion. No acute fracture.  IMPRESSION: Distended loop of colon in the mid abdomen. This could represent a sigmoid volvulus. CT abdomen pelvis recommended. Electronically Signed   By: Franchot Gallo M.D.   On: 08/26/2022 15:47   DG Knee Right Port  Result Date: 08/26/2022 CLINICAL DATA:  Fall EXAM: PORTABLE RIGHT KNEE - 2 VIEW COMPARISON:  05/09/2020 FINDINGS: Right knee replacement. Chronic  fracture distal femur with intramedullary rod and lateral plate and screws in good position. Acute fracture proximal tibia and fibula with mild displacement. Tibial fracture is just below the prosthesis. IMPRESSION: 1. Acute fracture proximal tibia and fibula. 2. Right knee replacement. Chronic fracture distal femur. Electronically Signed   By: Franchot Gallo M.D.   On: 08/26/2022 15:44   DG CHEST PORT 1 VIEW  Result Date: 08/26/2022 CLINICAL DATA:  Short of breath EXAM: PORTABLE CHEST 1 VIEW COMPARISON:  Chest 08/22/2022 FINDINGS: Right jugular catheter tip overlying the right lung apex unchanged. No pneumothorax Cardiac enlargement with mild vascular congestion. Negative for edema. Mild bibasilar airspace disease most compatible with atelectasis Right shoulder replacement without acute abnormality. IMPRESSION: 1. Cardiac enlargement with mild vascular congestion. 2. Mild bibasilar atelectasis. Electronically Signed   By: Franchot Gallo M.D.   On: 08/26/2022 15:42    Assessment/Plan: Principal Problem:   Displaced comminuted fracture of shaft of left femur, initial encounter for closed fracture (HCC) Active Problems:   OSA on CPAP   Hypothyroidism   Microcytic anemia   History of CVA (cerebrovascular accident)   Chronic diastolic CHF (congestive heart failure) (HCC)   Bedbound   Obesity (BMI 30-39.9)   A-fib ([place])   Type 2 diabetes mellitus without complications ([place])   DNR (do not resuscitate)   Acute urinary retention   Fall at nursing home   Closed fracture of distal end of left femur, initial encounter ([place])   Normocytic anemia   6 Days Post-Op s/p Procedure(s): OPEN REDUCTION INTERNAL FIXATION (ORIF) DISTAL FEMUR FRACTURE 08/22/2022  Right lower extremity proximal tibia periprosthetic fracture there is severely osteoporotic bone.  Reviewed x-rays which show a mildly impacted and displaced fracture of the right proximal tibia.  Clinically on exam of the right leg appears unstable.   Unclear the timing and etiology of the right knee fracture given the recent increase in pain and bruising to the right knee.  Given the severely osteoporotic bone and proximal nature of the fracture may not be amenable to primary fixation.  Will immobilize her in a knee immobilizer to see if this will help with her pain for hygiene and care.    Plan for nonop treatment right tibia fx in knee immobilzer Xrays left tibia and foot show significant diffuse osteopenia but no definitive fracture   WB: NWB BLE, nonweightbearing right lower extremity in knee immobilizer Abx: ancef + vanc Imaging: PACU xrays stable, s/p ORIF Dressing: keep intact until follow up, change PRN if soiled or saturated. DVT prophylaxis: lovenox starting POD1 x4 weeks Follow up: 2 weeks after surgery for a wound check with Dr. Zachery Dakins at [facility], MD Orthopaedic Surgery Contact information:   After hours and holidays please check [email or web address] for group call information for [facility]  Bonniejean Piano A Zachrey Deutscher 08/28/2022, 6:55 AM

## 2022-08-29 DIAGNOSIS — S72352A Displaced comminuted fracture of shaft of left femur, initial encounter for closed fracture: Secondary | ICD-10-CM | POA: Diagnosis not present

## 2022-08-29 LAB — CBC WITH DIFFERENTIAL/PLATELET
Abs Immature Granulocytes: 0.19 10*3/uL — ABNORMAL HIGH (ref 0.00–0.07)
Basophils Absolute: 0.1 10*3/uL (ref 0.0–0.1)
Basophils Relative: 0 %
Eosinophils Absolute: 0 10*3/uL (ref 0.0–0.5)
Eosinophils Relative: 0 %
HCT: 31 % — ABNORMAL LOW (ref 36.0–46.0)
Hemoglobin: 9 g/dL — ABNORMAL LOW (ref 12.0–15.0)
Immature Granulocytes: 1 %
Lymphocytes Relative: 10 %
Lymphs Abs: 1.4 10*3/uL (ref 0.7–4.0)
MCH: 26.4 pg (ref 26.0–34.0)
MCHC: 29 g/dL — ABNORMAL LOW (ref 30.0–36.0)
MCV: 90.9 fL (ref 80.0–100.0)
Monocytes Absolute: 1.3 10*3/uL — ABNORMAL HIGH (ref 0.1–1.0)
Monocytes Relative: 10 %
Neutro Abs: 10.7 10*3/uL — ABNORMAL HIGH (ref 1.7–7.7)
Neutrophils Relative %: 79 %
Platelets: 404 10*3/uL — ABNORMAL HIGH (ref 150–400)
RBC: 3.41 MIL/uL — ABNORMAL LOW (ref 3.87–5.11)
RDW: 22.6 % — ABNORMAL HIGH (ref 11.5–15.5)
WBC: 13.7 10*3/uL — ABNORMAL HIGH (ref 4.0–10.5)
nRBC: 0 % (ref 0.0–0.2)

## 2022-08-29 LAB — BASIC METABOLIC PANEL
Anion gap: 5 (ref 5–15)
BUN: 17 mg/dL (ref 8–23)
CO2: 34 mmol/L — ABNORMAL HIGH (ref 22–32)
Calcium: 9 mg/dL (ref 8.9–10.3)
Chloride: 98 mmol/L (ref 98–111)
Creatinine, Ser: 0.55 mg/dL (ref 0.44–1.00)
GFR, Estimated: >60 mL/min (ref >60)
Glucose, Bld: 170 mg/dL — ABNORMAL HIGH (ref 70–99)
Potassium: 3.9 mmol/L (ref 3.5–5.1)
Sodium: 137 mmol/L (ref 135–145)

## 2022-08-29 LAB — MAGNESIUM: Magnesium: 1.9 mg/dL (ref 1.7–2.4)

## 2022-08-29 MED ORDER — ALTEPLASE 2 MG IJ SOLR
2.0000 mg | Freq: Once | INTRAMUSCULAR | Status: DC
  Filled 2022-08-29: qty 2

## 2022-08-29 MED ORDER — FUROSEMIDE 10 MG/ML IJ SOLN
20.0000 mg | Freq: Two times a day (BID) | INTRAMUSCULAR | Status: DC
  Administered 2022-08-29 – 2022-08-30 (×2): 20 mg via INTRAVENOUS
  Filled 2022-08-29 (×3): qty 2

## 2022-08-29 NOTE — Progress Notes (Signed)
Received call from central telemetry, patient had a 3.55 second pause. Checked on patient- she was sleeping and in no acute distress, respirations easy and unlabored, on-call practitioned Abigail Butts, NP) notified via secure chat, patient back in baseline a-fib rhythm, will continue to monitor.

## 2022-08-29 NOTE — Progress Notes (Signed)
Orthopedic Tech Progress Note Patient Details:  Melinda Kerr October 12, 1934 [id number]  Ortho Devices Type of Ortho Device: Knee Immobilizer Ortho Device/Splint Location: RLE Ortho Device/Splint Interventions: Ordered, Application, Adjustment   Post Interventions Patient Tolerated: Well Instructions Provided: Poper ambulation with device, Care of device, Adjustment of device  Zarif Rathje 08/29/2022, 1:13 PM

## 2022-08-29 NOTE — Plan of Care (Signed)
  Problem: Coping: Goal: Level of anxiety will decrease Outcome: Progressing   Problem: Pain Managment: Goal: General experience of comfort will improve Outcome: Progressing   

## 2022-08-29 NOTE — Plan of Care (Signed)
  Problem: Education: Goal: Knowledge of General Education information will improve Description: Including pain rating scale, medication(s)/side effects and non-pharmacologic comfort measures Outcome: Progressing   Problem: Health Behavior/Discharge Planning: Goal: Ability to manage health-related needs will improve Outcome: Progressing   Problem: Clinical Measurements: Goal: Ability to maintain clinical measurements within normal limits will improve Outcome: Progressing   Problem: Activity: Goal: Risk for activity intolerance will decrease Outcome: Progressing   Problem: Nutrition: Goal: Adequate nutrition will be maintained Outcome: Progressing   Problem: Coping: Goal: Level of anxiety will decrease Outcome: Progressing   Problem: Elimination: Goal: Will not experience complications related to urinary retention Outcome: Progressing   Problem: Pain Managment: Goal: General experience of comfort will improve Outcome: Progressing   Problem: Safety: Goal: Ability to remain free from injury will improve Outcome: Progressing   Problem: Skin Integrity: Goal: Risk for impaired skin integrity will decrease Outcome: Progressing   

## 2022-08-29 NOTE — Progress Notes (Signed)
[facility] Progress Note Patient: Melinda Kerr HYI:[id number] DOB: 1935/01/28 DOA: 08/20/2022  DOS: the patient was seen and examined on 08/29/2022  Brief hospital course: Melinda Kerr is an 87 yo female with PMH afib, dementia, obesity, chronic dCHF, CVA, DMII who presented after a fall out of the bed. She resides at [facility]. She was brought to the ER after her fall.  X-rays and CT head obtained on admission.  Imaging shows left comminuted displaced distal femoral fracture.  She was admitted for evaluation with orthopedic surgery.  She underwent ORIF on 08/22/2022. On 11/29 found to have a possible sigmoid volvulus as well as a right tibia-fibula fracture.  GI consulted.  Orthopedic was reconsulted. Assessment and Plan: Displaced comminuted fracture of shaft of left femur, initial encounter for closed fracture ([place]) s/p fall from bed; likely some contribution from her dementia xray shows left comminuted displaced distal femoral fracture evaluated by ortho and underwent ORIF on 08/22/22. X-ray on 11/29 shows right tibia-fibula fracture.  X-ray on 11/30 shows possible left tibia fracture as well as left fourth proximal phalangeal fracture. Management per orthopedics, currently nonconservative therapy. continue pain control  follow up PT/OT evals post op  Severe constipation Concern for sigmoid volvulus currently resolved. On exam on 11/29 appears to have tympanic abdomen. X-ray abdomen is concerning for sigmoid volvulus. GI consulted.  CT abdomen shows no evidence of sigmoid volvulus but shows severe constipation. Also shows stercoral colitis. Treated with smog enema.  We will hold her regimen as patient is having multiple mild diarrhea.   Paroxysmal A-fib  Current episode most likely in relation to acute stress and pain. Not on any anticoagulation.  Recommend to hold anticoagulation. Continue Lopressor for rate control. Echocardiogram shows preserved EF, no wall motion  abnormality. TSH normal, 1.007   Acute on chronic diastolic CHF (congestive heart failure) (HCC) Continue IV Lasix for now.  Acute urinary retention Foley catheter was inserted which was removed on 11/27. Continue bladder scan may require Foley reinsertion if fails to void.   Microcytic anemia, acute postop expected blood loss anemia Baseline around 9. Hb dropped down to 5.8 requiring 4 PRBC transfusion. Mostly associated with hematoma related to the surgery and fall. Thankfully hemoglobin stable despite being on Lovenox.  We will monitor.  Poor vascular access. Currently has a central line.  Patient failed midline, PICC line as well as ultrasound-guided line placement. We will continue central line as patient is critically ill.   DNR (do not resuscitate) Verified DNR orders from SNF order sheet   Type 2 diabetes mellitus without complications ([place]) N8M 7.6% continue every 4 hours sliding scale for today.  Switch to ACHS tomorrow as long as no other issues.   Obesity (BMI 30-39.9) Body mass index is 37.46 kg/m.  Placing the patient at higher risk of poor outcome.   Bedbound Chronic.   History of CVA (cerebrovascular accident) Chronic. s/p surgery, would be reasonable to continue on asa    Hypothyroidism Stable. Continue synthroid 125 mcg daily TSH normal   OSA on CPAP Stable. Continue CPAP.  Leukocytosis. Likely related to patient's stress and pain and severe constipation. For now we will monitor.  No evidence of infection.  Procalcitonin negative.  Poor peripheral access. Patient is very poor peripheral access. IV team has attempted multiple times for a PIV and not successful. Unsuccessful to place a midline or PICC line as well. PCCM had to be consulted for placement of a central line. Patient pulled on the central line on  11/25 and therefore IJ currently and is near the tip of the lung rather than near right atrium. There is still good blood return through  one of the port. Okay to use the central line as PIV. Will not pull it as the patient has very limited IV access options.  Subjective: Continues to have shortness of breath.  Pain still present but well controlled.  No nausea no vomiting.  Physical Exam: General: Appear in mild distress, no Rash; Oral Mucosa clear, moist. Cardiovascular: S1 and S2 Present, no Murmur, Respiratory: Increased respiratory effort, Bilateral Air entry present and bilateral Crackles present , no wheezes Abdomen: Bowel Sound present, Soft and no tenderness Extremities: bilateral bruising and pedal edema Neurology: alert and oriented to Self, place and time affect appropriate. no new focal deficit   Data Reviewed: I have Reviewed nursing notes, Vitals, and Lab results. Since last encounter, pertinent lab results CBC and BMP   . I have ordered test including CBC and BMP  .    Disposition: Status is: Inpatient Remains inpatient appropriate because: Need for the diuresis  enoxaparin (LOVENOX) injection 40 mg Start: 08/23/22 1200   Family Communication: Discussed with niece on the phone on 11/29.   Level of care: Telemetry continue telemetry while receiving diuresis  Vitals:   08/28/22 2027 08/28/22 2126 08/29/22 0640 08/29/22 1343  BP:  120/72 127/69 123/84  Pulse: 87 93 68 85  Resp:  '18 19 17  '$ Temp:  98.5 F (36.9 C) 98.5 F (36.9 C) 98.6 F (37 C)  TempSrc:  Oral  Oral  SpO2:  98% 100% 93%  Weight:      Height:         Author: Berle Mull, MD 08/29/2022 6:35 PM  Please look on [email or web address] to find out who is on call.

## 2022-08-30 ENCOUNTER — Inpatient Hospital Stay (HOSPITAL_COMMUNITY): Payer: Medicare Other

## 2022-08-30 DIAGNOSIS — S72352A Displaced comminuted fracture of shaft of left femur, initial encounter for closed fracture: Secondary | ICD-10-CM | POA: Diagnosis not present

## 2022-08-30 LAB — CBC WITH DIFFERENTIAL/PLATELET
Abs Immature Granulocytes: 0.17 10*3/uL — ABNORMAL HIGH (ref 0.00–0.07)
Basophils Absolute: 0.1 10*3/uL (ref 0.0–0.1)
Basophils Relative: 0 %
Eosinophils Absolute: 0 10*3/uL (ref 0.0–0.5)
Eosinophils Relative: 0 %
HCT: 29.1 % — ABNORMAL LOW (ref 36.0–46.0)
Hemoglobin: 8.5 g/dL — ABNORMAL LOW (ref 12.0–15.0)
Immature Granulocytes: 1 %
Lymphocytes Relative: 13 %
Lymphs Abs: 1.8 10*3/uL (ref 0.7–4.0)
MCH: 26.5 pg (ref 26.0–34.0)
MCHC: 29.2 g/dL — ABNORMAL LOW (ref 30.0–36.0)
MCV: 90.7 fL (ref 80.0–100.0)
Monocytes Absolute: 1.2 10*3/uL — ABNORMAL HIGH (ref 0.1–1.0)
Monocytes Relative: 9 %
Neutro Abs: 10.5 10*3/uL — ABNORMAL HIGH (ref 1.7–7.7)
Neutrophils Relative %: 77 %
Platelets: 395 10*3/uL (ref 150–400)
RBC: 3.21 MIL/uL — ABNORMAL LOW (ref 3.87–5.11)
RDW: 22.5 % — ABNORMAL HIGH (ref 11.5–15.5)
WBC: 13.8 10*3/uL — ABNORMAL HIGH (ref 4.0–10.5)
nRBC: 0 % (ref 0.0–0.2)

## 2022-08-30 LAB — MAGNESIUM: Magnesium: 2.2 mg/dL (ref 1.7–2.4)

## 2022-08-30 LAB — BASIC METABOLIC PANEL
Anion gap: 6 (ref 5–15)
BUN: 21 mg/dL (ref 8–23)
CO2: 33 mmol/L — ABNORMAL HIGH (ref 22–32)
Calcium: 9.1 mg/dL (ref 8.9–10.3)
Chloride: 97 mmol/L — ABNORMAL LOW (ref 98–111)
Creatinine, Ser: 0.64 mg/dL (ref 0.44–1.00)
GFR, Estimated: >60 mL/min (ref >60)
Glucose, Bld: 175 mg/dL — ABNORMAL HIGH (ref 70–99)
Potassium: 3.8 mmol/L (ref 3.5–5.1)
Sodium: 136 mmol/L (ref 135–145)

## 2022-08-30 MED ORDER — FUROSEMIDE 40 MG PO TABS
40.0000 mg | ORAL_TABLET | Freq: Two times a day (BID) | ORAL | Status: DC
  Administered 2022-08-30 – 2022-08-31 (×2): 40 mg via ORAL
  Filled 2022-08-30 (×2): qty 1

## 2022-08-30 MED ORDER — POTASSIUM CHLORIDE CRYS ER 20 MEQ PO TBCR
40.0000 meq | EXTENDED_RELEASE_TABLET | Freq: Once | ORAL | Status: AC
  Administered 2022-08-30: 40 meq via ORAL
  Filled 2022-08-30: qty 2

## 2022-08-30 NOTE — Progress Notes (Signed)
Patient had fallen asleep and was maintaining oxygen saturations of 79%-83% on room air according to her continuous pulse ox monitor, respirations were unlabored, placed back on 2 liters nasal cannula and O2 sats improved to 95% within minutes.

## 2022-08-30 NOTE — Progress Notes (Signed)
[facility] Progress Note Patient: Melinda Kerr UMP:[id number] DOB: 1935-02-23 DOA: 08/20/2022  DOS: the patient was seen and examined on 08/30/2022  Brief hospital course: Ms. Ecklund is an 87 yo female with PMH afib, dementia, obesity, chronic dCHF, CVA, DMII who presented after a fall out of the bed. She resides at [facility]. She was brought to the ER after her fall.  X-rays and CT head obtained on admission.  Imaging shows left comminuted displaced distal femoral fracture.  She was admitted for evaluation with orthopedic surgery.  She underwent ORIF on 08/22/2022. On 11/29 found to have a possible sigmoid volvulus as well as a right tibia-fibula fracture.  GI consulted.  Orthopedic was reconsulted. Assessment and Plan: Displaced comminuted fracture of shaft of left femur, initial encounter for closed fracture ([place]) s/p fall from bed; likely some contribution from her dementia xray shows left comminuted displaced distal femoral fracture evaluated by ortho and underwent ORIF on 08/22/22. X-ray on 11/29 shows right tibia-fibula fracture.  X-ray on 11/30 shows possible left tibia fracture as well as left fourth proximal phalangeal fracture. Management per orthopedics, currently nonconservative therapy. continue pain control  PT OT recommends SNF.  Currently scheduled to go to SNF once medically stable.   Severe constipation Concern for sigmoid volvulus currently resolved. On exam on 11/29 appears to have tympanic abdomen. X-ray abdomen is concerning for sigmoid volvulus. GI consulted.  CT abdomen shows no evidence of sigmoid volvulus but shows severe constipation. Also shows stercoral colitis. Treated with smog enema. Resume bowel regimen tomorrow.   Paroxysmal A-fib  Current episode most likely in relation to acute stress and pain. Not on any anticoagulation.  Recommend to hold anticoagulation. Continue Lopressor for rate control. Echocardiogram shows preserved EF, no wall motion  abnormality. TSH normal, 1.007   Acute on chronic diastolic CHF (congestive heart failure) ([place]) Treated with IV Lasix.  Now we will switch to oral Lasix and monitor.  Chest x-ray continues to show some vascular congestion.  Urine output adequate.  Renal function stable.   Acute urinary retention Foley catheter was inserted which was removed on 11/27. Continue bladder scan may require Foley reinsertion if fails to void.   Microcytic anemia, acute postop expected blood loss anemia Baseline around 9. Hb dropped down to 5.8 requiring 4 PRBC transfusion. Mostly associated with hematoma related to the surgery and fall. Thankfully hemoglobin stable despite being on Lovenox.  We will monitor.   Poor vascular access. Currently has a central line.  Patient failed midline, PICC line as well as ultrasound-guided line placement. We will continue central line as patient is critically ill. There is still good blood return through one of the port. Okay to use the central line as PIV. Will not pull it as the patient has very limited IV access options.   DNR (do not resuscitate) Verified DNR orders from SNF order sheet   Type 2 diabetes mellitus without complications ([place]) Q0G 8.6% continue every 4 hours sliding scale for today.  Switch to ACHS tomorrow as long as no other issues.   Obesity (BMI 30-39.9) Body mass index is 37.46 kg/m.  Placing the patient at higher risk of poor outcome.   Bedbound Chronic.   History of CVA (cerebrovascular accident) Chronic. s/p surgery, would be reasonable to continue on asa    Hypothyroidism Stable. Continue synthroid 125 mcg daily TSH normal   OSA on CPAP Stable. Continue CPAP.   Leukocytosis. Likely related to patient's stress and pain and severe constipation. For now  we will monitor.  No evidence of infection.  Procalcitonin negative.   Subjective: Breathing improving.  No nausea no vomiting no fever no chills.  No chest pain.  Pain well  controlled.  Physical Exam: General: in mild distress;  Cardiovascular: S1 and S2 Present, no Murmur Respiratory: Normal respiratory effort, Bilateral Air entry present, bilateral basal crackles, no wheezes Abdomen: Bowel Sound present, Non tender  Extremities: Improving lower extremity edema, bruising still present Neurology: alert and oriented to place and person   Data Reviewed: I have Reviewed nursing notes, Vitals, and Lab results. Since last encounter, pertinent lab results CBC and BMP   . I have ordered test including CBC and BMP  . I have ordered imaging chest x-ray  .   Disposition: Status is: Inpatient Remains inpatient appropriate because: Monitor for improvement in oxygenation  enoxaparin (LOVENOX) injection 40 mg Start: 08/23/22 1200   Family Communication: No one at bedside Level of care: Telemetry Continue telemetry for CHF Vitals:   08/29/22 1343 08/29/22 2133 08/30/22 0503 08/30/22 1320  BP: 123/84 (!) 146/69 (!) 105/57 113/72  Pulse: 85 87 97 73  Resp: 17 (!) 21 (!) 22 20  Temp: 98.6 F (37 C) 98.2 F (36.8 C) 98.1 F (36.7 C) 97.6 F (36.4 C)  TempSrc: Oral Oral Oral Oral  SpO2: 93% 94% 97% 91%  Weight:      Height:         Author: Berle Mull, MD 08/30/2022 6:53 PM  Please look on [email or web address] to find out who is on call.

## 2022-08-30 NOTE — Plan of Care (Signed)
  Problem: Coping: Goal: Level of anxiety will decrease Outcome: Progressing   Problem: Safety: Goal: Ability to remain free from injury will improve Outcome: Progressing   Problem: Pain Managment: Goal: General experience of comfort will improve Outcome: Progressing

## 2022-08-31 DIAGNOSIS — S72352A Displaced comminuted fracture of shaft of left femur, initial encounter for closed fracture: Secondary | ICD-10-CM | POA: Diagnosis not present

## 2022-08-31 MED ORDER — ENOXAPARIN SODIUM 40 MG/0.4ML IJ SOSY: 40.0000 mg | PREFILLED_SYRINGE | Freq: Every day | INTRAMUSCULAR | 0 refills | Status: AC

## 2022-08-31 MED ORDER — SPIRONOLACTONE 25 MG PO TABS: 25.0000 mg | ORAL_TABLET | Freq: Every day | ORAL | 0 refills | Status: DC

## 2022-08-31 MED ORDER — METOCLOPRAMIDE HCL 5 MG PO TABS: 5.0000 mg | ORAL_TABLET | Freq: Three times a day (TID) | ORAL | 0 refills | Status: DC

## 2022-08-31 MED ORDER — BISACODYL 10 MG RE SUPP: 10.0000 mg | RECTAL | 0 refills | Status: DC | PRN

## 2022-08-31 MED ORDER — ASPIRIN 81 MG PO TBEC: 81.0000 mg | DELAYED_RELEASE_TABLET | Freq: Every day | ORAL | 12 refills | Status: DC

## 2022-08-31 MED ORDER — POLYETHYLENE GLYCOL 3350 17 G PO PACK: 17.0000 g | PACK | Freq: Every day | ORAL | Status: DC

## 2022-08-31 MED ORDER — SIMETHICONE 80 MG PO CHEW: 80.0000 mg | CHEWABLE_TABLET | Freq: Four times a day (QID) | ORAL | 0 refills | Status: AC

## 2022-08-31 MED ORDER — FUROSEMIDE 40 MG PO TABS: ORAL_TABLET | ORAL | 0 refills | Status: AC

## 2022-08-31 MED ORDER — ACETAMINOPHEN 325 MG PO TABS: 650.0000 mg | ORAL_TABLET | Freq: Four times a day (QID) | ORAL | Status: DC | PRN

## 2022-08-31 MED ORDER — HYDROCODONE-ACETAMINOPHEN 5-325 MG PO TABS: 1.0000 | ORAL_TABLET | Freq: Four times a day (QID) | ORAL | 0 refills | Status: DC | PRN

## 2022-08-31 MED ORDER — INSULIN GLARGINE 100 UNITS/ML SOLOSTAR PEN: 10.0000 [IU] | PEN_INJECTOR | Freq: Every day | SUBCUTANEOUS | 0 refills | Status: DC

## 2022-08-31 MED ORDER — GLUCERNA SHAKE PO LIQD: 237.0000 mL | Freq: Two times a day (BID) | ORAL | 0 refills | Status: DC

## 2022-08-31 NOTE — Discharge Summary (Signed)
Physician Discharge Summary   Patient: Melinda Kerr MRN: [id number] DOB: Jun 12, 1935  Admit date:     08/20/2022  Discharge date: 08/31/22  Discharge Physician: Berle Mull  PCP: Maury Dus, MD  Recommendations at discharge:  Follow up with PCP in 1 week with CBC and BMP Follow up with ortho as recommended   Contact information for follow-up providers     Maury Dus, MD. Schedule an appointment as soon as possible for a visit in 1 week(s).   Specialty: Family Medicine Why: with CBC and BMP Contact information: [place] [address] [phone]         Willaim Sheng, MD. Schedule an appointment as soon as possible for a visit in 1 week(s).   Specialty: Orthopedic Surgery Contact information: [place] [address] [phone]              Contact information for after-discharge care     Destination     [facility] Preferred SNF .   Service: Skilled Nursing Contact information: [place] [phone]                    Discharge Diagnoses: Principal Problem:   Displaced comminuted fracture of shaft of left femur, initial encounter for closed fracture [facility]) Active Problems:   A-fib ([place])   Chronic diastolic CHF (congestive heart failure) ([place])   Acute urinary retention   Microcytic anemia   OSA on CPAP   Hypothyroidism   History of CVA (cerebrovascular accident)   Bedbound   Obesity (BMI 30-39.9)   Type 2 diabetes mellitus without complications ([place])   DNR (do not resuscitate)   Fall at nursing home   Closed fracture of distal end of left femur, initial encounter [facility])   Normocytic anemia  Hospital Course: [name] is an 87 yo female with PMH afib, dementia, obesity, chronic dCHF, CVA, DMII who presented after a fall out of the bed. She resides at [facility]. She was brought to the ER after her fall.  X-rays  and CT head obtained on admission.  Imaging shows left comminuted displaced distal femoral fracture.  She was admitted for evaluation with orthopedic surgery.  She underwent ORIF on 08/22/2022. On 11/29 found to have a possible sigmoid volvulus as well as a right tibia-fibula fracture.  GI consulted.  Orthopedic was reconsulted. Assessment and Plan  Displaced comminuted fracture of shaft of left femur, initial encounter for closed fracture ([place]) s/p fall from bed; likely some contribution from her dementia xray shows left comminuted displaced distal femoral fracture evaluated by ortho and underwent ORIF on 08/22/22. X-ray on 11/29 shows right tibia-fibula fracture.  X-ray on 11/30 shows possible left tibia fracture as well as left fourth proximal phalangeal fracture. Management per orthopedics, currently nonconservative therapy. continue pain control  PT OT recommends SNF. Pt long term resident at [facility]    Severe constipation Concern for sigmoid volvulus currently resolved. On exam on 11/29 appears to have tympanic abdomen. X-ray abdomen is concerning for sigmoid volvulus. GI consulted.  CT abdomen shows no evidence of sigmoid volvulus but shows severe constipation. Also shows stercoral colitis. Treated with smog enema. Resume bowel regimen, gasx and reglan   Paroxysmal A-fib  Current episode most likely in relation to acute stress and pain. Not on any anticoagulation.  Recommend to hold anticoagulation. Continue Lopressor for rate control. Echocardiogram shows preserved EF, no wall motion abnormality.  TSH normal, 1.007   Acute on chronic diastolic CHF (congestive heart failure) ([place]) Treated with IV Lasix.  Now we will switch to oral Lasix and monitor.  Chest x-ray continues to show some vascular congestion.  Urine output adequate. Renal function stable.  Acute urinary retention Foley catheter was inserted which was removed on 11/27.   Microcytic anemia, acute postop expected  blood loss anemia Baseline around 9. Hb dropped down to 5.8 requiring 4 PRBC transfusion. Mostly associated with hematoma related to the surgery and fall. Thankfully hemoglobin stable despite being on Lovenox.  We will monitor.   Poor vascular access. Currently has a central line.  Patient failed midline, PICC line as well as ultrasound-guided line placement. Central line was placed. Pt pulled on it multiple times, x ray did not show any complication.    DNR (do not resuscitate) Verified DNR orders from SNF order sheet   Type 2 diabetes mellitus without complications ([place]) D7O 2.4% continue every 4 hours sliding scale for today.  Switch to ACHS tomorrow as long as no other issues.   Obesity (BMI 30-39.9) Body mass index is 37.46 kg/m.  Placing the patient at higher risk of poor outcome.   Bedbound Chronic.   History of CVA (cerebrovascular accident) Chronic. s/p surgery, would be reasonable to continue on asa after DVT prophylaxis is completed.    Hypothyroidism Stable. Continue synthroid 125 mcg daily TSH normal   OSA on CPAP Stable. Continue CPAP.   Leukocytosis. Likely related to patient's stress and pain and severe constipation. For now we will monitor.  No evidence of infection.  Procalcitonin negative.   Pain control - [employer] Controlled Substance Reporting System database was reviewed. and patient was instructed, not to drive, operate heavy machinery, perform activities at heights, swimming or participation in water activities or provide baby-sitting services while on Pain, Sleep and Anxiety Medications; until their outpatient Physician has advised to do so again. Also recommended to not to take more than prescribed Pain, Sleep and Anxiety Medications.  Consultants:  Orthopedics  Gastroenterology   Procedures performed:  Enema   PROCEDURE:  OPEN REDUCTION INTERNAL FIXATION (ORIF) DISTAL FEMUR FRACTURE 11/25 Willaim Sheng, MD   DISCHARGE  MEDICATION: Allergies as of 08/31/2022       Reactions   Oxybutynin Other (See Comments)   Bleeding gums, felt sick        Medication List     STOP taking these medications    ciprofloxacin 500 MG tablet Commonly known as: CIPRO   citalopram 10 MG tablet Commonly known as: CELEXA   docusate sodium 100 MG capsule Commonly known as: COLACE   hydrOXYzine 25 MG tablet Commonly known as: ATARAX   nystatin powder Commonly known as: MYCOSTATIN/NYSTOP   oxyCODONE 5 MG immediate release tablet Commonly known as: Oxy IR/ROXICODONE   Simethicone 250 MG Caps Replaced by: simethicone 80 MG chewable tablet   sucralfate 1 g tablet Commonly known as: CARAFATE   zolpidem 10 MG tablet Commonly known as: AMBIEN       TAKE these medications    acetaminophen 325 MG tablet Commonly known as: TYLENOL Take 2 tablets (650 mg total) by mouth every 6 (six) hours as needed for mild pain, fever or headache.   albuterol 108 (90 Base) MCG/ACT inhaler Commonly known as: VENTOLIN HFA Inhale 2 puffs into the lungs every 8 (eight) hours as needed for wheezing.   allopurinol 300 MG tablet Commonly known as: ZYLOPRIM Take 300 mg by mouth daily.  aspirin EC 81 MG tablet Take 1 tablet (81 mg total) by mouth daily. Swallow whole. Start taking on: September 22, 2022 What changed: These instructions start on September 22, 2022. If you are unsure what to do until then, ask your doctor or other care provider.   baclofen 10 MG tablet Commonly known as: LIORESAL Take 1 tablet (10 mg total) by mouth 3 (three) times daily as needed for muscle spasms. What changed: when to take this   bisacodyl 10 MG suppository Commonly known as: DULCOLAX Place 1 suppository (10 mg total) rectally as needed for moderate constipation.   calcium carbonate 500 MG chewable tablet Commonly known as: TUMS - dosed in mg elemental calcium Chew 1 tablet by mouth daily.   Dermacloud Oint Apply 1 Application  topically in the morning, at noon, and at bedtime. Apply to buttocks   diclofenac Sodium 1 % Gel Commonly known as: VOLTAREN Apply 2 g topically every 12 (twelve) hours as needed (pain).   DULoxetine HCl 30 MG Csdr Take 30 mg by mouth daily.   enoxaparin 40 MG/0.4ML injection Commonly known as: LOVENOX Inject 0.4 mLs (40 mg total) into the skin daily for 21 days.   feeding supplement (GLUCERNA SHAKE) Liqd Take 237 mLs by mouth 2 (two) times daily between meals.   furosemide 40 MG tablet Commonly known as: LASIX Take 1 tablet (40 mg total) by mouth 2 (two) times daily for 7 days, THEN 1 tablet (40 mg total) daily. Start taking on: August 31, 2022 What changed: See the new instructions.   HumaLOG KwikPen 100 UNIT/ML KwikPen Generic drug: insulin lispro Inject 5 Units into the skin 3 (three) times daily. Before meals   HYDROcodone-acetaminophen 5-325 MG tablet Commonly known as: NORCO/VICODIN Take 1 tablet by mouth every 6 (six) hours as needed for moderate pain or severe pain.   insulin glargine 100 unit/mL Sopn Commonly known as: LANTUS Inject 10 Units into the skin at bedtime. What changed: how much to take   levothyroxine 125 MCG tablet Commonly known as: SYNTHROID Take 125 mcg by mouth daily before breakfast.   melatonin 5 MG Tabs Take 5 mg by mouth at bedtime.   metoCLOPramide 5 MG tablet Commonly known as: REGLAN Take 1 tablet (5 mg total) by mouth 3 (three) times daily before meals. What changed: when to take this   OXYGEN Inhale 2 L/min into the lungs See admin instructions. 2 L/min every shift for shortness of breath and every 8 hours as needed to maintain sats of at least 90%   pantoprazole 40 MG tablet Commonly known as: PROTONIX Take 1 tablet (40 mg total) by mouth 2 (two) times daily before a meal.   polyethylene glycol 17 g packet Commonly known as: MIRALAX / GLYCOLAX Take 17 g by mouth daily.   Polysaccharide-Iron Complex 150 MG Caps Take 150  mg by mouth in the morning.   senna-docusate 8.6-50 MG tablet Commonly known as: Senokot-S Take 1 tablet by mouth daily.   simethicone 80 MG chewable tablet Commonly known as: Gas-X Chew 1 tablet (80 mg total) by mouth 4 (four) times daily. Replaces: Simethicone 250 MG Caps   spironolactone 25 MG tablet Commonly known as: ALDACTONE Take 1 tablet (25 mg total) by mouth daily. What changed: Another medication with the same name was removed. Continue taking this medication, and follow the directions you see here.   UNABLE TO FIND Apply 1 Application topically in the morning and at bedtime. Med Name: micro guard fungal powder  Apply under right breast for rash for 14 days starting 08/14/22   Vicks BabyRub Crea Apply 1 Application topically in the morning and at bedtime. Apply under nostril for irritation   Vitamin D (Ergocalciferol) 1.25 MG (50000 UNIT) Caps capsule Commonly known as: DRISDOL Take 50,000 Units by mouth every Monday.               Discharge Care Instructions  (From admission, onward)           Start     Ordered   08/31/22 0000  Leave dressing on - Keep it clean, dry, and intact until clinic visit        08/31/22 0920           Disposition: SNF Diet recommendation: Cardiac diet  Discharge Exam: Vitals:   08/30/22 2000 08/30/22 2005 08/30/22 2219 08/31/22 0533  BP:   125/81 135/81  Pulse:   95 86  Resp:   17 (!) 21  Temp:   98 F (36.7 C) 98.1 F (36.7 C)  TempSrc:   Oral Oral  SpO2: (!) 83% 95% 98% 92%  Weight:      Height:       General: Appear in mild distress; no visible Abnormal Neck Mass Or lumps, Conjunctiva normal Cardiovascular: S1 and S2 Present, no Murmur, Respiratory: good respiratory effort, Bilateral Air entry present and faint basal Crackles, no wheezes Abdomen: Bowel Sound present, Non tender, chronically distended Extremities: bilateral Pedal edema Neurology: alert and oriented to time, place, and person  Filed  Weights   08/20/22 0011  Weight: 102.1 kg   Condition at discharge: stable  The results of significant diagnostics from this hospitalization (including imaging, microbiology, ancillary and laboratory) are listed below for reference.   Imaging Studies: DG CHEST PORT 1 VIEW  Result Date: 08/30/2022 CLINICAL DATA:  Central line complication. EXAM: PORTABLE CHEST 1 VIEW COMPARISON:  One-view chest x-ray 08/28/2022 FINDINGS: The heart is enlarged. Interstitial and airspace opacities have increased since the prior exam. A right IJ line is stable. It is coiled in the neck. Right shoulder prosthesis noted. IMPRESSION: 1. Stable right IJ line. It is coiled in the neck. 2. Increasing interstitial and airspace opacities likely reflecting edema. Infection is not excluded. Electronically Signed   By: San Morelle M.D.   On: 08/30/2022 13:05   DG Abd Portable 1V  Result Date: 08/28/2022 CLINICAL DATA:  244010 Constipation [id number] EXAM: PORTABLE ABDOMEN - 1 VIEW COMPARISON:  08/26/2022 abdominal radiograph and CT abdomen/pelvis FINDINGS: No disproportionately dilated small bowel loops. Diffuse large colonic stool and gas with retained oral contrast throughout the colon. The degree of gaseous distention of the colon is improved from 08/26/2022. No evidence of pneumatosis or pneumoperitoneum. Moderate lumbar spondylosis. Partially visualized left hip hemiarthroplasty and fixation hardware in the proximal right femur. IMPRESSION: Nonobstructive bowel gas pattern. Diffuse large colonic stool and gas with retained oral contrast throughout the colon, compatible with reported constipation, improved from 08/26/2022. Electronically Signed   By: Ilona Sorrel M.D.   On: 08/28/2022 09:20   DG CHEST PORT 1 VIEW  Result Date: 08/28/2022 CLINICAL DATA:  Shortness of breath EXAM: PORTABLE CHEST 1 VIEW COMPARISON:  Chest x-ray dated August 26, 2022 FINDINGS: Unchanged position of right IJ line catheter. Cardiac and  mediastinal contours are unchanged. Large hiatal hernia. Similar mild interstitial opacities. Mild left basilar opacity. No large pleural effusion or evidence of pneumothorax. IMPRESSION: 1. Increased mild left basilar opacity, likely worsened atelectasis, although infection or aspiration  could appear similar. 2. Similar mild interstitial opacities which may be due to pulmonary edema. 3. Large hiatal hernia. Electronically Signed   By: Yetta Glassman M.D.   On: 08/28/2022 09:18   DG Tibia/Fibula Left Port  Result Date: 08/27/2022 CLINICAL DATA:  Left leg pain and swelling after fall EXAM: PORTABLE LEFT TIBIA AND FIBULA - 2 VIEW COMPARISON:  Left femur x-ray FINDINGS: Postsurgical changes from left total knee arthroplasty. There is cortical irregularity along the posterior cortex of the proximal tibia seen on lateral view suspicious for fracture. Bones are diffusely demineralized. Generalized soft tissue swelling. IMPRESSION: Cortical irregularity along the posterior cortex of the proximal tibia seen on lateral view suspicious for fracture. Dedicated radiographic knee series is suggested to better evaluate. Electronically Signed   By: Davina Poke D.O.   On: 08/27/2022 08:19   DG Foot 2 Views Left  Result Date: 08/27/2022 CLINICAL DATA:  Left foot pain and swelling after recent fall. EXAM: LEFT FOOT - 2 VIEW COMPARISON:  None Available. FINDINGS: Diffuse osteopenia is noted. Mild dorsal soft tissue swelling is noted. Possible mildly displaced fracture involving the fourth proximal phalanx. IMPRESSION: Possible mildly displaced fourth proximal phalangeal fracture. Diffuse osteopenia. Electronically Signed   By: Marijo Conception M.D.   On: 08/27/2022 08:17   VAS [place] LOWER EXTREMITY VENOUS (DVT)  Result Date: 08/26/2022  Lower Venous DVT Study Patient Name:  GEOVANNA SIMKO  Date of Exam:   08/26/2022 Medical Rec #: [id number]        Accession #:    [id number] Date of Birth: [birth year]         Patient  Gender: F Patient Age:   34 years Exam Location:  [facility] Procedure:      VAS [place] LOWER EXTREMITY VENOUS (DVT) Referring Phys: Joaquim Tolen --------------------------------------------------------------------------------  Indications: Edema.  Risk Factors: Surgery 08/22/2022 - Left OPEN REDUCTION INTERNAL FIXATION (ORIF) DISTAL FEMUR FRACTURE. Limitations: Body habitus, poor ultrasound/tissue interface and patient positioning, patient immobility, patient pain tolerance. Comparison Study: No prior studies. Performing Technologist: Oliver Hum RVT  Examination Guidelines: A complete evaluation includes B-mode imaging, spectral Doppler, color Doppler, and power Doppler as needed of all accessible portions of each vessel. Bilateral testing is considered an integral part of a complete examination. Limited examinations for reoccurring indications may be performed as noted. The reflux portion of the exam is performed with the patient in reverse Trendelenburg.  +---------+---------------+---------+-----------+----------+-------------------+ RIGHT    CompressibilityPhasicitySpontaneityPropertiesThrombus Aging      +---------+---------------+---------+-----------+----------+-------------------+ CFV      Full           Yes      Yes                                      +---------+---------------+---------+-----------+----------+-------------------+ SFJ      Full                                                             +---------+---------------+---------+-----------+----------+-------------------+ FV Prox  Full                                                             +---------+---------------+---------+-----------+----------+-------------------+  FV Mid                  Yes      Yes                                      +---------+---------------+---------+-----------+----------+-------------------+ FV Distal               Yes      Yes                                       +---------+---------------+---------+-----------+----------+-------------------+ PFV      Full                                                             +---------+---------------+---------+-----------+----------+-------------------+ POP      Full           Yes      Yes                                      +---------+---------------+---------+-----------+----------+-------------------+ PTV      Full                                                             +---------+---------------+---------+-----------+----------+-------------------+ PERO                                                  Not well visualized +---------+---------------+---------+-----------+----------+-------------------+   +---------+---------------+---------+-----------+----------+-------------------+ LEFT     CompressibilityPhasicitySpontaneityPropertiesThrombus Aging      +---------+---------------+---------+-----------+----------+-------------------+ CFV      Full           Yes      Yes                                      +---------+---------------+---------+-----------+----------+-------------------+ SFJ      Full                                                             +---------+---------------+---------+-----------+----------+-------------------+ FV Prox  Full                                                             +---------+---------------+---------+-----------+----------+-------------------+ FV Mid   Full                                                             +---------+---------------+---------+-----------+----------+-------------------+  FV DistalFull                                                             +---------+---------------+---------+-----------+----------+-------------------+ PFV      Full                                                              +---------+---------------+---------+-----------+----------+-------------------+ POP      Full           Yes      Yes                                      +---------+---------------+---------+-----------+----------+-------------------+ PTV                                                   Not well visualized +---------+---------------+---------+-----------+----------+-------------------+ PERO                                                  Not well visualized +---------+---------------+---------+-----------+----------+-------------------+     Summary: RIGHT: - There is no evidence of deep vein thrombosis in the lower extremity. However, portions of this examination were limited- see technologist comments above.  - No cystic structure found in the popliteal fossa.  LEFT: - There is no evidence of deep vein thrombosis in the lower extremity. However, portions of this examination were limited- see technologist comments above.  - No cystic structure found in the popliteal fossa.  *See table(s) above for measurements and observations. Electronically signed by Harold Barban MD on 08/26/2022 at 7:43:24 PM.    Final    CT ABDOMEN PELVIS W CONTRAST  Result Date: 08/26/2022 CLINICAL DATA:  Suspected bowel obstruction. EXAM: CT ABDOMEN AND PELVIS WITH CONTRAST TECHNIQUE: Multidetector CT imaging of the abdomen and pelvis was performed using the standard protocol following bolus administration of intravenous contrast. RADIATION DOSE REDUCTION: This exam was performed according to the departmental dose-optimization program which includes automated exposure control, adjustment of the mA and/or kV according to patient size and/or use of iterative reconstruction technique. CONTRAST:  114m OMNIPAQUE IOHEXOL 300 MG/ML  SOLN COMPARISON:  October 21, 2017 FINDINGS: Lower chest: Mild to moderate severity consolidation is seen within the posterior aspect of the bilateral lung bases. Small bilateral pleural  effusions are also seen. A large gastric hernia is seen along the posteromedial aspect of the right lung base. Hepatobiliary: No focal liver abnormality is seen. Status post cholecystectomy. No biliary dilatation. Pancreas: Unremarkable. No pancreatic ductal dilatation or surrounding inflammatory changes. Spleen: Normal in size without focal abnormality. Adrenals/Urinary Tract: Adrenal glands are unremarkable. Kidneys are normal, without obstructing renal calculi, focal lesion, or hydronephrosis. A 2 mm nonobstructing renal calculi are seen within the left kidney. Bladder is unremarkable.  Stomach/Bowel: There is a large gastric hernia. The appendix is surgically absent. Stool is seen throughout the large bowel. This is most prominent within the mid to distal sigmoid colon and rectum. Moderate severity presacral inflammatory fat stranding is noted. No evidence of bowel dilatation. Vascular/Lymphatic: Aortic atherosclerosis. No enlarged abdominal or pelvic lymph nodes. Reproductive: Status post hysterectomy. The bilateral adnexa are limited in evaluation secondary to overlying streak artifact. Other: Bilateral, marked severity subcutaneous inflammatory fat stranding and edema are seen along the lateral aspects of the abdominal and pelvic walls. No abdominopelvic ascites. Musculoskeletal: A total left hip replacement is seen. A metallic density intramedullary rod and compression screw device are also seen within the proximal right femur. An extensive amount of streak artifact is noted with subsequently limited evaluation of the adjacent osseous and soft tissue structures. Marked severity multilevel degenerative changes are seen throughout the lumbar spine. IMPRESSION: 1. Mild to moderate severity posterior bibasilar consolidation with small bilateral pleural effusions. 2. Large gastric hernia. 3. 2 mm nonobstructing left renal calculi. 4. Large stool burden, most prominent within the mid to distal sigmoid colon and  rectum. 5. Moderate severity presacral inflammatory fat stranding which may represent sequelae associated with stercoral colitis. 6. Total left hip replacement. 7. Postoperative changes involving the proximal right femur. 8. Aortic atherosclerosis. Aortic Atherosclerosis (ICD10-I70.0). Electronically Signed   By: Virgina Norfolk M.D.   On: 08/26/2022 19:37   DG Abd Portable 1V  Result Date: 08/26/2022 CLINICAL DATA:  Tympanic abdomen.  Fall. EXAM: PORTABLE ABDOMEN - 1 VIEW COMPARISON:  CT abdomen pelvis 10/21/2017 FINDINGS: Distended loop of colon in the mid abdomen. Mild amount of stool in gas in the remainder of the colon. Gas in nondilated small bowel loops. Surgical clips right upper quadrant.  Lumbar scoliosis Left hip replacement. Superior migration of the prosthesis in the acetabulum is unchanged. Chronic fracture right intertrochanteric femur with screw and rod fusion. No acute fracture. IMPRESSION: Distended loop of colon in the mid abdomen. This could represent a sigmoid volvulus. CT abdomen pelvis recommended. Electronically Signed   By: Franchot Gallo M.D.   On: 08/26/2022 15:47   DG Knee Right Port  Result Date: 08/26/2022 CLINICAL DATA:  Fall EXAM: PORTABLE RIGHT KNEE - 2 VIEW COMPARISON:  05/09/2020 FINDINGS: Right knee replacement. Chronic fracture distal femur with intramedullary rod and lateral plate and screws in good position. Acute fracture proximal tibia and fibula with mild displacement. Tibial fracture is just below the prosthesis. IMPRESSION: 1. Acute fracture proximal tibia and fibula. 2. Right knee replacement. Chronic fracture distal femur. Electronically Signed   By: Franchot Gallo M.D.   On: 08/26/2022 15:44   DG CHEST PORT 1 VIEW  Result Date: 08/26/2022 CLINICAL DATA:  Short of breath EXAM: PORTABLE CHEST 1 VIEW COMPARISON:  Chest 08/22/2022 FINDINGS: Right jugular catheter tip overlying the right lung apex unchanged. No pneumothorax Cardiac enlargement with mild  vascular congestion. Negative for edema. Mild bibasilar airspace disease most compatible with atelectasis Right shoulder replacement without acute abnormality. IMPRESSION: 1. Cardiac enlargement with mild vascular congestion. 2. Mild bibasilar atelectasis. Electronically Signed   By: Franchot Gallo M.D.   On: 08/26/2022 15:42   DG FEMUR PORT MIN 2 VIEWS LEFT  Result Date: 08/22/2022 CLINICAL DATA:  Status post internal fixation of left distal femur fracture. EXAM: LEFT FEMUR PORTABLE 2 VIEWS COMPARISON:  Fluoroscopic images of same day. FINDINGS: Status post left hemiarthroplasty and left total knee arthroplasty. Status post surgical internal fixation of comminuted and displaced distal  left femoral periprosthetic fracture. IMPRESSION: Status post surgical internal fixation of comminuted and displaced distal left femoral periprosthetic fracture. Electronically Signed   By: Marijo Conception M.D.   On: 08/22/2022 13:56   DG FEMUR MIN 2 VIEWS LEFT  Result Date: 08/22/2022 CLINICAL DATA:  Left femur fracture. EXAM: LEFT FEMUR 2 VIEWS; DG C-ARM 1-60 MIN-NO REPORT Radiation exposure index: 29.447 mGy. COMPARISON:  August 20, 2022. FINDINGS: Twelve intraoperative fluoroscopic images were obtained of the left femur. These images demonstrate surgical internal fixation of distal left femoral fracture. IMPRESSION: Fluoroscopic guidance provided during surgical internal fixation of distal left femoral fracture. Electronically Signed   By: Marijo Conception M.D.   On: 08/22/2022 11:28   DG C-Arm 1-60 Min-No Report  Result Date: 08/22/2022 Fluoroscopy was utilized by the requesting physician.  No radiographic interpretation.   DG C-Arm 1-60 Min-No Report  Result Date: 08/22/2022 Fluoroscopy was utilized by the requesting physician.  No radiographic interpretation.   DG Chest Port 1 View  Result Date: 08/22/2022 CLINICAL DATA:  Check central line placement EXAM: PORTABLE CHEST 1 VIEW COMPARISON:  Film from  the previous day. FINDINGS: Cardiac shadow is enlarged. Aortic calcifications are again noted. Right jugular central line is again seen with the catheter tip in the proximal superior vena cava. A loop has formed in the interval from the prior exam. This likely lies beneath the skin surface. Lungs are clear. No pneumothorax is noted. Postsurgical changes in the right shoulder are seen. IMPRESSION: Interval formation of a loop in the midportion of the right jugular central catheter. No pneumothorax is noted. Electronically Signed   By: Inez Catalina M.D.   On: 08/22/2022 03:38   DG CHEST PORT 1 VIEW  Result Date: 08/21/2022 CLINICAL DATA:  [id number] Encounter for central line placement [id number] EXAM: PORTABLE CHEST 1 VIEW COMPARISON:  Chest x-ray 08/20/2022, CT chest 05/16/2019 FINDINGS: Internal right internal jugular central venous catheter placement with tip overlying the expected region of the superior cavoatrial junction. Enlarged cardiac silhouette. Otherwise the heart and mediastinal contours are within normal limits. Atherosclerotic plaque. Low lung volumes. No focal consolidation. No pulmonary edema. No pleural effusion. No pneumothorax. No acute osseous abnormality. Partially visualized right shoulder arthroplasty. IMPRESSION: 1. Right internal jugular central venous catheter in appropriate position. 2. Enlarged cardiac silhouette with underlying pericardial effusion not excluded. 3. Low lung volumes. 4.  Aortic Atherosclerosis (ICD10-I70.0). Electronically Signed   By: Iven Finn M.D.   On: 08/21/2022 19:00   [place] EKG SITE RITE  Result Date: 08/21/2022 If Site Rite image not attached, placement could not be confirmed due to current cardiac rhythm.  ECHOCARDIOGRAM COMPLETE  Result Date: 08/20/2022    ECHOCARDIOGRAM REPORT   Patient Name:   KATIANA RULAND Date of Exam: 08/20/2022 Medical Rec #:  [id number]       Height:       65.0 in Accession #:    [id number]      Weight:       225.1 lb Date of  Birth:  April 08, 1935        BSA:          2.080 m Patient Age:    68 years        BP:           114/68 mmHg Patient Gender: F               HR:           111 bpm. Exam  Location:  Inpatient Procedure: 2D Echo Indications:    atrial fibrillation  History:        Patient has prior history of Echocardiogram examinations, most                 recent 02/04/2021. CHF; Risk Factors:Sleep Apnea and Diabetes.  Sonographer:    Johny Chess RDCS Referring Phys: [place]  Sonographer Comments: Technically difficult study due to poor echo windows and patient is obese. Image acquisition challenging due to uncooperative patient, Image acquisition challenging due to patient body habitus and Image acquisition challenging due to respiratory motion. IMPRESSIONS  1. Left ventricular ejection fraction, by estimation, is 60 to 65%. The left ventricle has normal function. The left ventricle has no regional wall motion abnormalities. There is mild concentric left ventricular hypertrophy. Left ventricular diastolic parameters are indeterminate.  2. Right ventricular systolic function is normal. The right ventricular size is normal. There is mildly elevated pulmonary artery systolic pressure.  3. Left atrial size was moderately dilated.  4. Prominent epicardial fat pad unchanged from 2022 study.  5. The mitral valve is grossly normal. No evidence of mitral valve regurgitation. No evidence of mitral stenosis.  6. The aortic valve was not well visualized. Aortic valve regurgitation is not visualized. Aortic valve sclerosis is present, with no evidence of aortic valve stenosis.  7. The inferior vena cava is normal in size with greater than 50% respiratory variability, suggesting right atrial pressure of 3 mmHg. Comparison(s): No significant change from prior study. FINDINGS  Left Ventricle: Left ventricular ejection fraction, by estimation, is 60 to 65%. The left ventricle has normal function. The left ventricle has no regional wall  motion abnormalities. The left ventricular internal cavity size was small. There is mild concentric left ventricular hypertrophy. Left ventricular diastolic parameters are indeterminate. Right Ventricle: The right ventricular size is normal. No increase in right ventricular wall thickness. Right ventricular systolic function is normal. There is mildly elevated pulmonary artery systolic pressure. The tricuspid regurgitant velocity is 3.03  m/s, and with an assumed right atrial pressure of 3 mmHg, the estimated right ventricular systolic pressure is 89.2 mmHg. Left Atrium: Left atrial size was moderately dilated. Right Atrium: Right atrial size was normal in size. Pericardium: Prominent epicardial fat pad unchanged from 2022 study. There is no evidence of pericardial effusion. Mitral Valve: The mitral valve is grossly normal. Mild mitral annular calcification. No evidence of mitral valve regurgitation. No evidence of mitral valve stenosis. Tricuspid Valve: The tricuspid valve is normal in structure. Tricuspid valve regurgitation is trivial. No evidence of tricuspid stenosis. Aortic Valve: The aortic valve was not well visualized. Aortic valve regurgitation is not visualized. Aortic valve sclerosis is present, with no evidence of aortic valve stenosis. Pulmonic Valve: The pulmonic valve was not well visualized. Pulmonic valve regurgitation is not visualized. No evidence of pulmonic stenosis. Aorta: The aortic root, ascending aorta and aortic arch are all structurally normal, with no evidence of dilitation or obstruction. Venous: The inferior vena cava is normal in size with greater than 50% respiratory variability, suggesting right atrial pressure of 3 mmHg. IAS/Shunts: No atrial level shunt detected by color flow Doppler.  LEFT VENTRICLE PLAX 2D LVIDd:         3.20 cm LVIDs:         2.20 cm LV PW:         1.10 cm LV IVS:        1.20 cm LVOT diam:     2.00  cm LVOT Area:     3.14 cm  RIGHT VENTRICLE         IVC TAPSE  (M-mode): 2.0 cm  IVC diam: 1.40 cm LEFT ATRIUM             Index LA diam:        3.40 cm 1.63 cm/m LA Vol (A2C):   68.5 ml 32.93 ml/m LA Vol (A4C):   96.2 ml 46.25 ml/m LA Biplane Vol: 86.5 ml 41.59 ml/m   AORTA Ao Root diam: 3.00 cm Ao Asc diam:  3.20 cm TRICUSPID VALVE TR Peak grad:   36.7 mmHg TR Vmax:        303.00 cm/s  SHUNTS Systemic Diam: 2.00 cm Rudean Haskell MD Electronically signed by Rudean Haskell MD Signature Date/Time: 08/20/2022/11:31:31 AM    Final    DG Femur Min 2 Views Left  Result Date: 08/20/2022 CLINICAL DATA:  Fall, thigh pain EXAM: LEFT FEMUR 2 VIEWS COMPARISON:  None FINDINGS: Prior left hip and knee replacement. There is a comminuted fracture through the distal femoral metaphysis just above the knee hardware. Posterior displacement of distal fragments. IMPRESSION: Comminuted displaced distal femoral fracture. Electronically Signed   By: Rolm Baptise M.D.   On: 08/20/2022 03:19   CT CERVICAL SPINE WO CONTRAST  Result Date: 08/20/2022 CLINICAL DATA:  Neck trauma (Age >= 65y) EXAM: CT CERVICAL SPINE WITHOUT CONTRAST TECHNIQUE: Multidetector CT imaging of the cervical spine was performed without intravenous contrast. Multiplanar CT image reconstructions were also generated. RADIATION DOSE REDUCTION: This exam was performed according to the departmental dose-optimization program which includes automated exposure control, adjustment of the mA and/or kV according to patient size and/or use of iterative reconstruction technique. COMPARISON:  None Available. FINDINGS: Alignment: Normal. Skull base and vertebrae: No acute fracture. No primary bone lesion or focal pathologic process. Soft tissues and spinal canal: No prevertebral fluid or swelling. No visible canal hematoma. Disc levels: Extensive degenerative disc disease identified at each cervical level with loss of disc spaces, anterior and posterior projecting marginal osteophytes. There is multilevel facet joint  osteoarthritis as well as uncovertebral joint degenerative changes at multiple levels. Upper chest: Negative. IMPRESSION: Extensive degenerative changes.  No acute traumatic abnormalities. Electronically Signed   By: Sammie Bench M.D.   On: 08/20/2022 01:31   CT HEAD WO CONTRAST  Result Date: 08/20/2022 CLINICAL DATA:  Head trauma, fall EXAM: CT HEAD WITHOUT CONTRAST TECHNIQUE: Contiguous axial images were obtained from the base of the skull through the vertex without intravenous contrast. RADIATION DOSE REDUCTION: This exam was performed according to the departmental dose-optimization program which includes automated exposure control, adjustment of the mA and/or kV according to patient size and/or use of iterative reconstruction technique. COMPARISON:  05/14/2019 FINDINGS: Brain: There is periventricular white matter decreased attenuation consistent with small vessel ischemic changes. Ventricles, sulci and cisterns are prominent consistent with age related involutional changes. No acute intracranial hemorrhage, mass effect or shift. No hydrocephalus. There is new hypoattenuation in the left frontal lobe which could represent sequelae of a CVA. Consider MRI if this is a concern clinically. Vascular: Intracerebral vascular atheromatous changes noted. Skull: Normal. Negative for fracture or focal lesion. Sinuses/Orbits: No acute finding. IMPRESSION: Atrophy and chronic small vessel ischemic changes. Left frontal decreased attenuation which is new suggesting possible interval CVA. No evidence of mass effect or shift. Electronically Signed   By: Sammie Bench M.D.   On: 08/20/2022 01:28   DG Hip Unilat With Pelvis 2-3 Views Left  Result Date: 08/20/2022 CLINICAL DATA:  Fall EXAM: DG HIP (WITH OR WITHOUT PELVIS) 2-3V LEFT COMPARISON:  None Available. FINDINGS: Left hip arthroplasty is present. There is no acute fracture or dislocation identified. Right-sided hip screw and intramedullary nail are present.  There is heterotopic ossification surrounding the right hip. Moderate degenerative changes affect the right hip. IMPRESSION: 1. No acute findings. 2. Moderate degenerative changes of the right hip. Electronically Signed   By: Ronney Asters M.D.   On: 08/20/2022 01:24   DG Chest 1 View  Result Date: 08/20/2022 CLINICAL DATA:  Fall. EXAM: CHEST  1 VIEW COMPARISON:  Chest x-ray 03/12/2021.  Chest CT 05/14/2019. FINDINGS: Large hiatal hernia is again seen. Heart is mildly enlarged. There central pulmonary vascular congestion. There is no focal lung infiltrate, pleural effusion or pneumothorax. There is minimal bibasilar atelectasis. No acute fractures are seen. Right shoulder arthroplasty is present. IMPRESSION: 1. Mild cardiomegaly with central pulmonary vascular congestion. 2. Large hiatal hernia. Electronically Signed   By: Ronney Asters M.D.   On: 08/20/2022 01:22    Microbiology: Results for orders placed or performed during the hospital encounter of 08/20/22  Surgical PCR screen     Status: Abnormal   Collection Time: 08/20/22  7:34 AM   Specimen: Nasal Mucosa; Nasal Swab  Result Value Ref Range Status   MRSA, PCR POSITIVE (A) NEGATIVE Final   Staphylococcus aureus POSITIVE (A) NEGATIVE Final    Comment: (NOTE) The Xpert SA Assay (FDA approved for NASAL specimens in patients 71 years of age and older), is one component of a comprehensive surveillance program. It is not intended to diagnose infection nor to guide or monitor treatment. Performed at [facility], [place] [address] [place], [place] [address]   Urine Culture     Status: None   Collection Time: 08/20/22  5:18 PM   Specimen: In/Out Cath Urine  Result Value Ref Range Status   Specimen Description   Final    IN/OUT CATH URINE Performed at [facility] [address] [place], [place] [address]    Special Requests   Final    NONE Performed at [facility], [place]  [address], [place], [place] [address]    Culture   Final    NO GROWTH Performed at [facility], [place] [address], [place], [place] [address]    Report Status 08/21/2022 FINAL  Final   Labs: CBC: Recent Labs  Lab 08/26/22 0329 08/27/22 0340 08/28/22 0909 08/29/22 0214 08/30/22 0225  WBC 14.0* 14.3* 13.3* 13.7* 13.8*  NEUTROABS 10.5* 11.2* 10.3* 10.7* 10.5*  HGB 8.8* 8.7* 9.5* 9.0* 8.5*  HCT 29.4* 29.0* 32.3* 31.0* 29.1*  MCV 87.5 89.0 91.2 90.9 90.7  PLT 268 296 346 404* 329   Basic Metabolic Panel: Recent Labs  Lab 08/26/22 0329 08/27/22 0340 08/28/22 0909 08/29/22 0214 08/30/22 0225  NA 136 136 138 137 136  K 3.6 3.8 4.2 3.9 3.8  CL 100 99 100 98 97*  CO2 31 32 34* 34* 33*  GLUCOSE 193* 142* 150* 170* 175*  BUN '21 17 15 17 21  '$ CREATININE 0.60 0.51 0.59 0.55 0.64  CALCIUM 8.9 8.8* 9.1 9.0 9.1  MG 2.0 2.0 2.1 1.9 2.2   Liver Function Tests: No results for input(s): "AST", "ALT", "ALKPHOS", "BILITOT", "PROT", "ALBUMIN" in the last 168 hours. CBG: Recent Labs  Lab 08/25/22 1658 08/25/22 2144 08/26/22 0827 08/26/22 1224 08/26/22 1708  GLUCAP 185* 236* 162* 166* 186*  Discharge time spent: greater than 30 minutes.  Signed: Berle Mull, MD [facility]

## 2022-08-31 NOTE — Progress Notes (Signed)
Reached Neale Burly, Nurse at [facility] to give report. Ivan Anchors, RN 08/31/22 12:55 PM

## 2022-08-31 NOTE — TOC Transition Note (Signed)
Transition of Care [facility]) - CM/SW Discharge Note  Patient Details  Name: Melinda Kerr MRN: [id number] Date of Birth: 25-Jul-1935  [facility] CM/SW Contact:  Sherie Don, LCSW Phone Number: 08/31/2022, 10:39 AM  Clinical Narrative: Patient is medically ready to return to [facility]. CSW confirmed return with Linus Orn in admissions and patient will discharge on oxygen. The number for report is [phone]. Discharge summary, discharge orders, and SNF transfer report faxed to facility in hub. CSW left VM for patient's niece/POA, [name] Yow, regarding discharge. Discharge packet completed. Medical necessity form done; PTAR scheduled. RN updated. TOC signing off.  Final next level of care: Skilled Nursing Facility Barriers to Discharge: Barriers Resolved  Patient Goals and CMS Choice Patient states their goals for this hospitalization and ongoing recovery are:: return to SNF CMS Medicare.gov Compare Post Acute Care list provided to:: Other (Comment Required) ([name] Doree Fudge (niece)) Choice offered to / list presented to : [facility] POA / Guardian  Discharge Placement Existing PASRR number confirmed : 08/28/22          Patient chooses bed at: [facility] Patient to be transferred to facility by: [facility] Name of family member notified: Kathrynn Humble (niece) Patient and family notified of of transfer: 08/31/22  Discharge Plan and Services In-house Referral: Clinical Social Work        DME Arranged: N/A DME Agency: NA  Social Determinants of Health ([place]) Interventions    Readmission Risk Interventions    08/21/2022    2:17 PM 02/06/2021    2:54 PM 05/10/2020   12:58 PM  Readmission Risk Prevention Plan  Transportation Screening Complete Complete Complete  PCP or Specialist Appt within 5-7 Days Complete Complete Complete  Home Care Screening Complete Complete Complete  Medication Review (RN CM) Complete Complete Complete

## 2022-08-31 NOTE — Plan of Care (Signed)
Patient returning to [facility] today. Updated [name] Yow, POA. Awaiting PTAR transport. AVS placed in discharge packet. Ivan Anchors, RN 08/31/22 11:20 AM

## 2022-08-31 NOTE — Progress Notes (Signed)
     Subjective: 9 Days Post-Op s/p Procedure(s): OPEN REDUCTION INTERNAL FIXATION (ORIF) DISTAL FEMUR FRACTURE Patient is lying comfortably in bed.  Denies any pain at best baseline.  She does have discomfort when moving both of her lower extremities localized to the knee area.  Plan for SNF once medically stable.  Objective:  PE: VITALS:   Vitals:   08/30/22 2000 08/30/22 2005 08/30/22 2219 08/31/22 0533  BP:   125/81 135/81  Pulse:   95 86  Resp:   17 (!) 21  Temp:   98 F (36.7 C) 98.1 F (36.7 C)  TempSrc:   Oral Oral  SpO2: (!) 83% 95% 98% 92%  Weight:      Height:       General: laying in bed in no acute distress GI: soft, nontender abdomen MSK: Significant swelling and bruising around the right lower extremity.  Tender about the proximal tibia and right knee.  On the left side she has dressings clean dry and intact along the left thigh with mild strikethrough on the proximal dressing.  Nontender about the left thigh and left knee.  She does have significant swelling and bruising distally in the tibia, ankle, and foot, and she is diffusely tender in this area.   LABS  No results found for this or any previous visit (from the past 24 hour(s)).   DG CHEST PORT 1 VIEW  Result Date: 08/30/2022 CLINICAL DATA:  Central line complication. EXAM: PORTABLE CHEST 1 VIEW COMPARISON:  One-view chest x-ray 08/28/2022 FINDINGS: The heart is enlarged. Interstitial and airspace opacities have increased since the prior exam. A right IJ line is stable. It is coiled in the neck. Right shoulder prosthesis noted. IMPRESSION: 1. Stable right IJ line. It is coiled in the neck. 2. Increasing interstitial and airspace opacities likely reflecting edema. Infection is not excluded. Electronically Signed   By: San Morelle M.D.   On: 08/30/2022 13:05    Assessment/Plan: Principal Problem:   Displaced comminuted fracture of shaft of left femur, initial encounter for closed fracture  (HCC) Active Problems:   OSA on CPAP   Hypothyroidism   Microcytic anemia   History of CVA (cerebrovascular accident)   Chronic diastolic CHF (congestive heart failure) (HCC)   Bedbound   Obesity (BMI 30-39.9)   A-fib ([place])   Type 2 diabetes mellitus without complications ([place])   DNR (do not resuscitate)   Acute urinary retention   Fall at nursing home   Closed fracture of distal end of left femur, initial encounter ([place])   Normocytic anemia   9 Days Post-Op s/p Procedure(s): OPEN REDUCTION INTERNAL FIXATION (ORIF) DISTAL FEMUR FRACTURE 08/22/2022  Plan for nonop treatment right tibia fx in knee immobilzer Xrays left tibia and foot show significant diffuse osteopenia but no definitive fracture   WB: NWB BLE, nonweightbearing right lower extremity in knee immobilizer Abx: ancef + vanc Imaging: PACU xrays stable, s/p ORIF Dressing: keep intact until follow up, change PRN if soiled or saturated. DVT prophylaxis: lovenox starting POD1 x4 weeks Follow up: 2 weeks after surgery for a wound check with Dr. Zachery Dakins at [facility], MD Orthopaedic Surgery Contact information:   After hours and holidays please check [email or web address] for group call information for [facility] [place] 08/31/2022, 7:00 AM

## 2022-08-31 NOTE — Progress Notes (Addendum)
IJ CVC removed per order and prior to d/c.  Pressure held to achieve hemostasis, vaseline/gauze/tegaderm applied.  Aftercare instructions reviewed with transport team who states they will share in report to SNF.

## 2022-08-31 NOTE — Progress Notes (Signed)
Patient sleeping soundly at this time, no acute respiratory distress, O2 sats at 94% on 2 liters nasal cannula, will continue to monitor.

## 2022-10-01 ENCOUNTER — Ambulatory Visit: Payer: Medicare Other | Admitting: Gastroenterology

## 2022-10-16 ENCOUNTER — Ambulatory Visit: Payer: Medicare Other | Admitting: Gastroenterology

## 2023-02-27 DEATH — deceased

## 2024-04-14 ENCOUNTER — Encounter: Payer: Self-pay | Admitting: Advanced Practice Midwife
